# Patient Record
Sex: Male | Born: 1955 | Race: Black or African American | Hispanic: No | Marital: Single | Smoking: Current every day smoker
Health system: Southern US, Community
[De-identification: ages and names within clinical notes are randomized; demographics above are authoritative.]

## PROBLEM LIST (undated history)

## (undated) ENCOUNTER — Emergency Department (HOSPITAL_COMMUNITY): Admission: EM | Discharge status: Absent without leave | Payer: Medicare Other

## (undated) DIAGNOSIS — D126 Benign neoplasm of colon, unspecified: Secondary | ICD-10-CM

## (undated) DIAGNOSIS — F1410 Cocaine abuse, uncomplicated: Secondary | ICD-10-CM

## (undated) DIAGNOSIS — Z992 Dependence on renal dialysis: Secondary | ICD-10-CM

## (undated) DIAGNOSIS — B9681 Helicobacter pylori [H. pylori] as the cause of diseases classified elsewhere: Secondary | ICD-10-CM

## (undated) DIAGNOSIS — S060XAA Concussion with loss of consciousness status unknown, initial encounter: Secondary | ICD-10-CM

## (undated) DIAGNOSIS — K2970 Gastritis, unspecified, without bleeding: Secondary | ICD-10-CM

## (undated) DIAGNOSIS — N186 End stage renal disease: Secondary | ICD-10-CM

## (undated) DIAGNOSIS — I5032 Chronic diastolic (congestive) heart failure: Secondary | ICD-10-CM

## (undated) DIAGNOSIS — S060X9A Concussion with loss of consciousness of unspecified duration, initial encounter: Secondary | ICD-10-CM

## (undated) DIAGNOSIS — Z9289 Personal history of other medical treatment: Secondary | ICD-10-CM

## (undated) DIAGNOSIS — E119 Type 2 diabetes mellitus without complications: Secondary | ICD-10-CM

## (undated) DIAGNOSIS — M1990 Unspecified osteoarthritis, unspecified site: Secondary | ICD-10-CM

## (undated) DIAGNOSIS — H469 Unspecified optic neuritis: Secondary | ICD-10-CM

## (undated) DIAGNOSIS — Z720 Tobacco use: Secondary | ICD-10-CM

## (undated) DIAGNOSIS — D638 Anemia in other chronic diseases classified elsewhere: Secondary | ICD-10-CM

## (undated) DIAGNOSIS — G40909 Epilepsy, unspecified, not intractable, without status epilepticus: Secondary | ICD-10-CM

## (undated) DIAGNOSIS — R7612 Nonspecific reaction to cell mediated immunity measurement of gamma interferon antigen response without active tuberculosis: Secondary | ICD-10-CM

## (undated) DIAGNOSIS — K922 Gastrointestinal hemorrhage, unspecified: Secondary | ICD-10-CM

## (undated) DIAGNOSIS — R768 Other specified abnormal immunological findings in serum: Secondary | ICD-10-CM

## (undated) DIAGNOSIS — K921 Melena: Secondary | ICD-10-CM

## (undated) DIAGNOSIS — R51 Headache: Secondary | ICD-10-CM

## (undated) DIAGNOSIS — I119 Hypertensive heart disease without heart failure: Secondary | ICD-10-CM

## (undated) DIAGNOSIS — E875 Hyperkalemia: Secondary | ICD-10-CM

## (undated) DIAGNOSIS — E43 Unspecified severe protein-calorie malnutrition: Secondary | ICD-10-CM

## (undated) DIAGNOSIS — I10 Essential (primary) hypertension: Secondary | ICD-10-CM

## (undated) HISTORY — PX: SHOULDER OPEN ROTATOR CUFF REPAIR: SHX2407

## (undated) HISTORY — PX: INSERTION OF DIALYSIS CATHETER: SHX1324

## (undated) HISTORY — DX: Gastrointestinal hemorrhage, unspecified: K92.2

## (undated) HISTORY — PX: TOTAL KNEE ARTHROPLASTY: SHX125

## (undated) HISTORY — PX: JOINT REPLACEMENT: SHX530

---

## 2012-03-02 ENCOUNTER — Emergency Department (HOSPITAL_COMMUNITY): Payer: Medicare Other

## 2012-03-02 ENCOUNTER — Encounter (HOSPITAL_COMMUNITY): Payer: Self-pay | Admitting: *Deleted

## 2012-03-02 ENCOUNTER — Inpatient Hospital Stay (HOSPITAL_COMMUNITY)
Admission: EM | Admit: 2012-03-02 | Discharge: 2012-03-08 | DRG: 628 | Disposition: A | Discharge status: Home / Self Care | Payer: Medicare Other | Attending: Internal Medicine | Admitting: Internal Medicine

## 2012-03-02 DIAGNOSIS — F1410 Cocaine abuse, uncomplicated: Secondary | ICD-10-CM | POA: Diagnosis present

## 2012-03-02 DIAGNOSIS — G40909 Epilepsy, unspecified, not intractable, without status epilepticus: Secondary | ICD-10-CM | POA: Diagnosis present

## 2012-03-02 DIAGNOSIS — E8779 Other fluid overload: Principal | ICD-10-CM | POA: Diagnosis present

## 2012-03-02 DIAGNOSIS — N186 End stage renal disease: Secondary | ICD-10-CM | POA: Diagnosis present

## 2012-03-02 DIAGNOSIS — D631 Anemia in chronic kidney disease: Secondary | ICD-10-CM | POA: Diagnosis present

## 2012-03-02 DIAGNOSIS — E1129 Type 2 diabetes mellitus with other diabetic kidney complication: Secondary | ICD-10-CM | POA: Diagnosis present

## 2012-03-02 DIAGNOSIS — Z91158 Patient's noncompliance with renal dialysis for other reason: Secondary | ICD-10-CM

## 2012-03-02 DIAGNOSIS — I120 Hypertensive chronic kidney disease with stage 5 chronic kidney disease or end stage renal disease: Secondary | ICD-10-CM | POA: Diagnosis present

## 2012-03-02 DIAGNOSIS — Z992 Dependence on renal dialysis: Secondary | ICD-10-CM | POA: Diagnosis present

## 2012-03-02 DIAGNOSIS — E119 Type 2 diabetes mellitus without complications: Secondary | ICD-10-CM | POA: Diagnosis present

## 2012-03-02 DIAGNOSIS — N2581 Secondary hyperparathyroidism of renal origin: Secondary | ICD-10-CM | POA: Diagnosis present

## 2012-03-02 DIAGNOSIS — D649 Anemia, unspecified: Secondary | ICD-10-CM | POA: Diagnosis present

## 2012-03-02 DIAGNOSIS — I10 Essential (primary) hypertension: Secondary | ICD-10-CM | POA: Diagnosis present

## 2012-03-02 DIAGNOSIS — R195 Other fecal abnormalities: Secondary | ICD-10-CM | POA: Diagnosis present

## 2012-03-02 DIAGNOSIS — Z9115 Patient's noncompliance with renal dialysis: Secondary | ICD-10-CM

## 2012-03-02 DIAGNOSIS — I5032 Chronic diastolic (congestive) heart failure: Secondary | ICD-10-CM | POA: Diagnosis present

## 2012-03-02 DIAGNOSIS — Z720 Tobacco use: Secondary | ICD-10-CM | POA: Diagnosis present

## 2012-03-02 DIAGNOSIS — E8770 Fluid overload, unspecified: Secondary | ICD-10-CM | POA: Diagnosis present

## 2012-03-02 DIAGNOSIS — Z79899 Other long term (current) drug therapy: Secondary | ICD-10-CM

## 2012-03-02 DIAGNOSIS — R7611 Nonspecific reaction to tuberculin skin test without active tuberculosis: Secondary | ICD-10-CM | POA: Diagnosis present

## 2012-03-02 DIAGNOSIS — E875 Hyperkalemia: Secondary | ICD-10-CM | POA: Diagnosis present

## 2012-03-02 DIAGNOSIS — B1920 Unspecified viral hepatitis C without hepatic coma: Secondary | ICD-10-CM | POA: Diagnosis present

## 2012-03-02 DIAGNOSIS — F17200 Nicotine dependence, unspecified, uncomplicated: Secondary | ICD-10-CM | POA: Diagnosis present

## 2012-03-02 HISTORY — DX: Essential (primary) hypertension: I10

## 2012-03-02 HISTORY — DX: Dependence on renal dialysis: Z99.2

## 2012-03-02 HISTORY — DX: Nonspecific reaction to cell mediated immunity measurement of gamma interferon antigen response without active tuberculosis: R76.12

## 2012-03-02 HISTORY — DX: Anemia in other chronic diseases classified elsewhere: D63.8

## 2012-03-02 HISTORY — DX: Epilepsy, unspecified, not intractable, without status epilepticus: G40.909

## 2012-03-02 HISTORY — DX: Benign neoplasm of colon, unspecified: D12.6

## 2012-03-02 HISTORY — DX: Helicobacter pylori (H. pylori) as the cause of diseases classified elsewhere: K29.70

## 2012-03-02 HISTORY — DX: Type 2 diabetes mellitus without complications: E11.9

## 2012-03-02 HISTORY — DX: Helicobacter pylori (H. pylori) as the cause of diseases classified elsewhere: B96.81

## 2012-03-02 HISTORY — DX: Cocaine abuse, uncomplicated: F14.10

## 2012-03-02 HISTORY — DX: End stage renal disease: N18.6

## 2012-03-02 HISTORY — DX: Other specified abnormal immunological findings in serum: R76.8

## 2012-03-02 LAB — CBC
MCH: 28.3 pg (ref 26.0–34.0)
MCHC: 34 g/dL (ref 30.0–36.0)
Platelets: 154 10*3/uL (ref 150–400)
RBC: 1.87 MIL/uL — ABNORMAL LOW (ref 4.22–5.81)

## 2012-03-02 LAB — CBC WITH DIFFERENTIAL/PLATELET
Basophils Absolute: 0 10*3/uL (ref 0.0–0.1)
HCT: 18.1 % — ABNORMAL LOW (ref 39.0–52.0)
Hemoglobin: 6.1 g/dL — CL (ref 13.0–17.0)
Lymphocytes Relative: 17 % (ref 12–46)
Monocytes Absolute: 0.6 10*3/uL (ref 0.1–1.0)
Monocytes Relative: 7 % (ref 3–12)
Neutro Abs: 6.3 10*3/uL (ref 1.7–7.7)
Neutrophils Relative %: 75 % (ref 43–77)
RDW: 14.9 % (ref 11.5–15.5)
WBC: 8.4 10*3/uL (ref 4.0–10.5)

## 2012-03-02 LAB — BASIC METABOLIC PANEL
CO2: 18 mEq/L — ABNORMAL LOW (ref 19–32)
Chloride: 102 mEq/L (ref 96–112)
Creatinine, Ser: 13.55 mg/dL — ABNORMAL HIGH (ref 0.50–1.35)
GFR calc Af Amer: 4 mL/min — ABNORMAL LOW (ref >90)
Potassium: 6.5 mEq/L (ref 3.5–5.1)

## 2012-03-02 LAB — HEPATIC FUNCTION PANEL
Alkaline Phosphatase: 38 U/L — ABNORMAL LOW (ref 39–117)
Bilirubin, Direct: <0.1 mg/dL (ref 0.0–0.3)
Total Protein: 6.3 g/dL (ref 6.0–8.3)

## 2012-03-02 LAB — RENAL FUNCTION PANEL
Albumin: 2.7 g/dL — ABNORMAL LOW (ref 3.5–5.2)
Calcium: 7.4 mg/dL — ABNORMAL LOW (ref 8.4–10.5)
GFR calc Af Amer: 4 mL/min — ABNORMAL LOW (ref >90)
Phosphorus: 7.3 mg/dL — ABNORMAL HIGH (ref 2.3–4.6)
Potassium: 7 mEq/L (ref 3.5–5.1)
Sodium: 143 mEq/L (ref 135–145)

## 2012-03-02 LAB — FERRITIN: Ferritin: 287 ng/mL (ref 22–322)

## 2012-03-02 LAB — URINALYSIS, MICROSCOPIC ONLY
Leukocytes, UA: NEGATIVE
Nitrite: NEGATIVE
Specific Gravity, Urine: 1.016 (ref 1.005–1.030)
Urobilinogen, UA: 0.2 mg/dL (ref 0.0–1.0)
pH: 7.5 (ref 5.0–8.0)

## 2012-03-02 LAB — OCCULT BLOOD, POC DEVICE: Fecal Occult Bld: POSITIVE

## 2012-03-02 LAB — HEMOGLOBIN AND HEMATOCRIT, BLOOD
HCT: 20.9 % — ABNORMAL LOW (ref 39.0–52.0)
Hemoglobin: 7.3 g/dL — ABNORMAL LOW (ref 13.0–17.0)

## 2012-03-02 LAB — LIPID PANEL
HDL: 38 mg/dL — ABNORMAL LOW (ref >39)
LDL Cholesterol: 62 mg/dL (ref 0–99)
Triglycerides: 65 mg/dL (ref <150)
VLDL: 13 mg/dL (ref 0–40)

## 2012-03-02 LAB — RETICULOCYTES
Retic Count, Absolute: 9.9 10*3/uL — ABNORMAL LOW (ref 19.0–186.0)
Retic Ct Pct: 0.7 % (ref 0.4–3.1)

## 2012-03-02 LAB — VITAMIN B12: Vitamin B-12: 532 pg/mL (ref 211–911)

## 2012-03-02 LAB — IRON AND TIBC: TIBC: 212 ug/dL — ABNORMAL LOW (ref 215–435)

## 2012-03-02 LAB — LACTATE DEHYDROGENASE: LDH: 309 U/L — ABNORMAL HIGH (ref 94–250)

## 2012-03-02 LAB — GLUCOSE, CAPILLARY: Glucose-Capillary: 84 mg/dL (ref 70–99)

## 2012-03-02 LAB — RAPID URINE DRUG SCREEN, HOSP PERFORMED
Barbiturates: NOT DETECTED
Tetrahydrocannabinol: NOT DETECTED

## 2012-03-02 LAB — PREPARE RBC (CROSSMATCH)

## 2012-03-02 LAB — MAGNESIUM: Magnesium: 2 mg/dL (ref 1.5–2.5)

## 2012-03-02 LAB — PROTIME-INR: Prothrombin Time: 14.5 seconds (ref 11.6–15.2)

## 2012-03-02 LAB — HAPTOGLOBIN: Haptoglobin: 57 mg/dL (ref 45–215)

## 2012-03-02 MED ORDER — PENTAFLUOROPROP-TETRAFLUOROETH EX AERO: 1.0000 "application " | INHALATION_SPRAY | CUTANEOUS | Status: DC | PRN

## 2012-03-02 MED ORDER — SODIUM CHLORIDE 0.9 % IJ SOLN
3.0000 mL | Freq: Two times a day (BID) | INTRAMUSCULAR | Status: DC
  Administered 2012-03-02 – 2012-03-08 (×10): 3 mL via INTRAVENOUS

## 2012-03-02 MED ORDER — NEPRO/CARBSTEADY PO LIQD: 237.0000 mL | ORAL | Status: DC | PRN

## 2012-03-02 MED ORDER — PANTOPRAZOLE SODIUM 40 MG IV SOLR
40.0000 mg | Freq: Every day | INTRAVENOUS | Status: DC
  Administered 2012-03-02: 40 mg via INTRAVENOUS
  Filled 2012-03-02 (×2): qty 40

## 2012-03-02 MED ORDER — FOLIC ACID 1 MG PO TABS
1.0000 mg | ORAL_TABLET | Freq: Every day | ORAL | Status: DC
  Administered 2012-03-02 – 2012-03-08 (×6): 1 mg via ORAL
  Filled 2012-03-02 (×7): qty 1

## 2012-03-02 MED ORDER — LIDOCAINE-PRILOCAINE 2.5-2.5 % EX CREA: 1.0000 "application " | TOPICAL_CREAM | CUTANEOUS | Status: DC | PRN

## 2012-03-02 MED ORDER — ALTEPLASE 2 MG IJ SOLR
2.0000 mg | Freq: Once | INTRAMUSCULAR | Status: DC | PRN
  Filled 2012-03-02: qty 2

## 2012-03-02 MED ORDER — HYDRALAZINE HCL 50 MG PO TABS
50.0000 mg | ORAL_TABLET | Freq: Three times a day (TID) | ORAL | Status: DC
  Administered 2012-03-02 – 2012-03-04 (×7): 50 mg via ORAL
  Filled 2012-03-02 (×11): qty 1

## 2012-03-02 MED ORDER — PARICALCITOL 5 MCG/ML IV SOLN: 10.0000 ug | INTRAVENOUS | Status: DC

## 2012-03-02 MED ORDER — MECLIZINE HCL 12.5 MG PO TABS
12.5000 mg | ORAL_TABLET | Freq: Three times a day (TID) | ORAL | Status: DC | PRN
  Filled 2012-03-02: qty 1

## 2012-03-02 MED ORDER — INSULIN ASPART 100 UNIT/ML IV SOLN
10.0000 [IU] | Freq: Once | INTRAVENOUS | Status: AC
  Administered 2012-03-02: 10 [IU] via INTRAVENOUS
  Filled 2012-03-02: qty 0.1

## 2012-03-02 MED ORDER — VITAMIN B-1 100 MG PO TABS
100.0000 mg | ORAL_TABLET | Freq: Every day | ORAL | Status: DC
  Administered 2012-03-02 – 2012-03-08 (×6): 100 mg via ORAL
  Filled 2012-03-02 (×7): qty 1

## 2012-03-02 MED ORDER — LORAZEPAM 1 MG PO TABS
1.0000 mg | ORAL_TABLET | Freq: Four times a day (QID) | ORAL | Status: AC | PRN
  Administered 2012-03-02: 1 mg via ORAL
  Filled 2012-03-02: qty 1

## 2012-03-02 MED ORDER — LORAZEPAM 2 MG/ML IJ SOLN: 1.0000 mg | Freq: Four times a day (QID) | INTRAMUSCULAR | Status: AC | PRN

## 2012-03-02 MED ORDER — DEXTROSE 50 % IV SOLN
1.0000 | Freq: Once | INTRAVENOUS | Status: AC
  Administered 2012-03-02: 50 mL via INTRAVENOUS
  Filled 2012-03-02: qty 50

## 2012-03-02 MED ORDER — DARBEPOETIN ALFA-POLYSORBATE 200 MCG/0.4ML IJ SOLN
200.0000 ug | INTRAMUSCULAR | Status: DC
  Administered 2012-03-02: 200 ug via INTRAVENOUS
  Filled 2012-03-02: qty 0.4

## 2012-03-02 MED ORDER — SODIUM CHLORIDE 0.9 % IV SOLN: 100.0000 mL | INTRAVENOUS | Status: DC | PRN

## 2012-03-02 MED ORDER — ADULT MULTIVITAMIN W/MINERALS CH
1.0000 | ORAL_TABLET | Freq: Every day | ORAL | Status: DC
  Administered 2012-03-02 – 2012-03-08 (×6): 1 via ORAL
  Filled 2012-03-02 (×7): qty 1

## 2012-03-02 MED ORDER — CALCIUM GLUCONATE 10 % IV SOLN
1.0000 g | Freq: Once | INTRAVENOUS | Status: AC
  Administered 2012-03-02: 1 g via INTRAVENOUS
  Filled 2012-03-02: qty 10

## 2012-03-02 MED ORDER — THIAMINE HCL 100 MG/ML IJ SOLN
100.0000 mg | Freq: Every day | INTRAMUSCULAR | Status: DC
  Filled 2012-03-02 (×7): qty 1

## 2012-03-02 MED ORDER — DARBEPOETIN ALFA-POLYSORBATE 200 MCG/0.4ML IJ SOLN
INTRAMUSCULAR | Status: AC
  Filled 2012-03-02: qty 0.4

## 2012-03-02 MED ORDER — LIDOCAINE HCL (PF) 1 % IJ SOLN: 5.0000 mL | INTRAMUSCULAR | Status: DC | PRN

## 2012-03-02 MED ORDER — PARICALCITOL 5 MCG/ML IV SOLN
INTRAVENOUS | Status: AC
  Administered 2012-03-02: 10 ug
  Filled 2012-03-02: qty 2

## 2012-03-02 MED ORDER — HEPARIN SODIUM (PORCINE) 1000 UNIT/ML DIALYSIS
1000.0000 [IU] | INTRAMUSCULAR | Status: DC | PRN
  Filled 2012-03-02: qty 1

## 2012-03-02 MED ORDER — LEVETIRACETAM 500 MG PO TABS
1000.0000 mg | ORAL_TABLET | Freq: Two times a day (BID) | ORAL | Status: DC
  Administered 2012-03-02 – 2012-03-08 (×11): 1000 mg via ORAL
  Filled 2012-03-02 (×14): qty 2

## 2012-03-02 NOTE — Consult Note (Signed)
I have personally seen and examined this patient and agree with the assessment/plan as outlined above by [name] PA.   Plan on emergent dialysis for significant hyperkalemia and transfusion of PRBCs while on HD for his anemia. GI consulted by admitting service for GI bleed evaluation/management.  He will need a permanent dialysis access placed prior to acceptance to any local unit in the [place] area. Will get vascular surgery to see him to this effect. Thorough verification of his social situation (living set up) will need to be done prior to discharge as well.   Resume binders/VDRA and ESA as part of ongoing ESRD care.  Fenton Candee K.,MD 03/02/2012 3:17 PM

## 2012-03-02 NOTE — H&P (Signed)
Internal Medicine Attending Admission Note Date: 03/02/2012  Patient name: [name] Medical record number: [id number] Date of birth: 08/24/1955 Age: 56 y.o. Gender: male  I saw and evaluated the patient. I reviewed the resident's note and I agree with the resident's findings and plan as documented in the resident's note, with the following additional comments.  Chief Complaint(s): Shortness of breath  History - key components related to admission: Patient is a 56 year old man with history of end-stage renal disease on hemodialysis, hypertension, diabetes mellitus, COPD, hepatitis C, and other problems as outlined in the medical history who presented with complaint of progressively worsening shortness of breath.  He apparently missed his dialysis session this week.   Physical Exam - key components related to admission:  Filed Vitals:   03/02/12 1630 03/02/12 1645 03/02/12 1700 03/02/12 1730  BP: 199/100 210/101 204/95 188/79  Pulse: 81 82  83  Temp: 97.6 F (36.4 C) 97.6 F (36.4 C)    TempSrc: Oral Oral    Resp: 18 19    Height:      Weight:      SpO2:       General: Alert, no acute distress Lungs: Clear Heart: Regular; no extra sounds or murmurs Abdomen: Bowel sounds present, soft, nontender Extremities: No edema   Lab results:   Basic Metabolic Panel:  Basename 03/02/12 1407 03/02/12 1145 03/02/12 0951 03/02/12 0726  NA -- -- 143 142  K 6.5* -- 7.0* --  CL -- -- 104 102  CO2 -- -- 18* 18*  GLUCOSE -- -- 80 86  BUN -- -- 135* 135*  CREATININE -- -- 13.66* 13.55*  CALCIUM -- -- 7.4* 7.6*  MG -- 2.0 -- --  PHOS -- -- 7.3* --   Liver Function Tests:  [facility] 03/02/12 0951 03/02/12 0950  AST -- 16  ALT -- 12  ALKPHOS -- 38*  BILITOT -- 0.2*  PROT -- 6.3  ALBUMIN 2.7* 2.7*    CBC:  Basename 03/02/12 1145 03/02/12 0726  WBC 8.7 8.4  NEUTROABS -- 6.3  HGB 5.3* 6.1*  HCT 15.6* 18.1*  MCV 83.4 85.0  PLT 154 165    CBG:  Basename 03/02/12 1328    GLUCAP 82    Fasting Lipid Panel:  Basename 03/02/12 1059  CHOL 113  HDL 38*  LDLCALC 62  TRIG 65  CHOLHDL 3.0  LDLDIRECT --    Anemia Panel:  Basename 03/02/12 0950  VITAMINB12 532  FOLATE --  FERRITIN 287  TIBC 212*  IRON 23*  RETICCTPCT 0.7   Coagulation:  Basename 03/02/12 0950  INR 1.15   Drugs of Abuse     Component Value Date/Time   LABOPIA NONE DETECTED 03/02/2012 0728   COCAINSCRNUR POSITIVE* 03/02/2012 0728   LABBENZ NONE DETECTED 03/02/2012 0728   AMPHETMU NONE DETECTED 03/02/2012 0728   THCU NONE DETECTED 03/02/2012 0728   LABBARB NONE DETECTED 03/02/2012 0728    Urinalysis    Component Value Date/Time   COLORURINE YELLOW 03/02/2012 0728   APPEARANCEUR CLEAR 03/02/2012 0728   LABSPEC 1.016 03/02/2012 0728   PHURINE 7.5 03/02/2012 0728   GLUCOSEU 250* 03/02/2012 0728   HGBUR SMALL* 03/02/2012 0728   BILIRUBINUR NEGATIVE 03/02/2012 0728   KETONESUR NEGATIVE 03/02/2012 0728   PROTEINUR >300* 03/02/2012 0728   UROBILINOGEN 0.2 03/02/2012 0728   NITRITE NEGATIVE 03/02/2012 0728   LEUKOCYTESUR NEGATIVE 03/02/2012 0728   Urine microscopic: WBCs 0-2, RBCs 3-6, squamous epithelial rare, bacteria rare   Imaging results:  Dg  Chest 2 View  03/02/2012  *RADIOLOGY REPORT*  Clinical Data: Cough, congestion, shortness of breath, weakness, smoker, hypertension, diabetes, end-stage renal disease on dialysis  CHEST - 2 VIEW  Comparison: None  Findings: Right jugular central venous catheter with tip projecting over right atrium. Mild enlargement of cardiac silhouette with pulmonary vascular congestion. Mediastinal contours normal. Minimal bronchitic changes. No segmental consolidation, pleural effusion or pneumothorax. Question 12 mm diameter nodular density mid to lower right chest, question nipple shadow. Bones demineralized.  IMPRESSION: Enlargement of cardiac silhouette with pulmonary vascular congestion. Question right nipple shadow; repeat PA chest  radiograph with nipple markers recommended to exclude pulmonary nodule.   Original Report Authenticated By: Ulyses Southward, M.D.      Assessment & Plan by Problem:  1.  Chronic renal failure on hemodialysis, now with volume overload and hyperkalemia.  Patient presents with shortness of breath likely due to volume overload secondary to missed hemodialysis; labs were notable for hyperkalemia.  [name] currently being hemodialyzed; plan is hemodialysis as per nephrology.  2.  Severe anemia.  Patient has a history of severe anemia according to records from providers in [place], and has undergone prior workup there in 2012.  Plan is transfuse with hemodialysis; follow hemoglobin closely; GI consult obtained.  3.  Hypertension.  Patient presents with poorly controlled hypertension, likely aggravated by volume overload.  Plan as above is for hemodialysis; continue hydralazine; reinstitute home regimen as able.  4.  CODE STATUS.  I discussed code status with patient, and he indicated to me that he desired full code status.  Given this, the plan is to change his status to full code.  5.  Other problems as per resident physician's note.

## 2012-03-02 NOTE — Progress Notes (Signed)
Denzil Magnuson, PA notified of critical K+ level of 6.5.  Order received to continue with current plan of care.  Pt continues on 1k+ bath for first hour of treatment then resume to 2K+ for remainder of treatment.
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
## 2012-03-02 NOTE — ED Notes (Addendum)
Pt states that his vascular access was last used on 11/09
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
## 2012-03-02 NOTE — ED Notes (Signed)
Nephrology MD a bedside.

## 2012-03-02 NOTE — ED Notes (Signed)
Lab at bedside to collect blood.  

## 2012-03-02 NOTE — Progress Notes (Signed)
CRITICAL VALUE ALERT  Critical value received:  Potassium 6.5 Date of notification:03-02-2012  Time of notification:  1510  Critical value read back:yes  Nurse who received alert:  Kandis Ban  MD notified (1st page):  Denzil Magnuson, PA  Time of first page:  1515  MD notified (2nd page):  Time of second page:  Responding MD:  Denzil Magnuson, PA  Time MD responded:  1520

## 2012-03-02 NOTE — ED Notes (Signed)
Pt. Reports that he dies not feel well.  Pt. Is from [facility] and is trying to get his dialysis changed to [place]. Pt. Has no thad dialysis since Saturday.

## 2012-03-02 NOTE — Procedures (Addendum)
Patient seen on Hemodialysis- sleepy but easily awoken and engaged in converasation. QB 350, UF goal 1.3L Treatment adjusted as needed. Dialysis and PRBC transfusion consent forms signed.  Zetta Bills MD [facility]. Office # [phone] Pager # [phone] 2:51 PM

## 2012-03-02 NOTE — Progress Notes (Signed)
Utilization Review Completed.   Telly Broberg, RN, BSN Nurse Case Manager  [phone]  

## 2012-03-02 NOTE — H&P (Signed)
Date: 03/02/2012               Patient Name:  [name] MRN: [id number]  DOB: October 14, 1955 Age / Sex: 56 y.o., male   PCP: None.               Medical Service: Internal Medicine Teaching Service              Attending Physician: Dr. Meredith Pel    First Contact: Dr. Heloise Beecham Pager: [phone]  Second Contact: Dr. Bosie Clos Pager: [phone]            After Hours (After 5p/  First Contact Pager: [phone]  weekends / holidays): Second Contact Pager: [phone]     Chief Complaint: shortness of breath  History of Present Illness: Patient is a 56 y.o. male with a PMHx of ESRD (TTS in [place], [place] - with recent relocation to [place] without establishing with renal, continues to make urine), who presents to [facility] for evaluation of progressively worsening shortness of breath over the last several days. States that the shortness of breath is present both at rest, and worse with exertion. He has also experienced occasionally productive cough over this time frame, without associated fevers, chills, chest tightness, or wheezing. The patient states that his HD schedule is TTS and he last went to dialysis on Saturday (4 days prior to admission), however, did miss his Tuesday dialysis center, as he was not able to travel back to [place]. The patient otherwise denies chest pain, palpitations, dizziness, lightheadedness, blood in his stools or urine. He further denies redness, swelling, or drainage from his R subclavian HD catheter site. He has had a colonoscopy in the last 5-10 years, although he cannot recall where or any results. The patient's story regarding history of fevers, chills varies per interviewer, however, he denies these symptoms to me. He further denies recent increased use of NSAIDs, history of GI ulcers, and personal or family history of GI malignancies.  Of note, during the ER course, the patient was noted to have hyperkalemia with a Serum K of 6.5, and was administered calcium gluconate,  followed by D50 and insulin. Renal service as been consulted, and plan for dialysis. As well, he is noted to be FOBT (+) with Hgb of 6.1, and is therefore being typed and screened.   Review of Systems: Constitutional:  denies fever, chills, diaphoresis, appetite change and fatigue.  HEENT: denies photophobia, eye pain, redness, hearing loss, ear pain, congestion, sore throat, rhinorrhea, sneezing.  Respiratory: admits to SOB, DOE, cough. Denies chest tightness, and wheezing.  Cardiovascular: denies chest pain, palpitations and leg swelling.  Gastrointestinal: denies nausea, vomiting, abdominal pain, diarrhea, constipation, blood in stool.  Genitourinary: denies dysuria, urgency, frequency, hematuria, flank pain and difficulty urinating.  Musculoskeletal: denies  myalgias, back pain, joint swelling, arthralgias and gait problem.   Skin: denies pallor, rash and wound.  Neurological: denies dizziness, seizures, syncope, weakness, light-headedness, numbness and headaches.   Hematological: denies adenopathy, easy bruising, personal or family bleeding history.    Current Outpatient Medications: Current Facility-Administered Medications Medication Dose  . [COMPLETED] calcium gluconate 1 g in sodium chloride 0.9 % 100 mL IVPB  1 g  . dextrose 50 % solution 50 mL  1 ampule  . insulin aspart (novoLOG) injection 10 Units  10 Units   Current Outpatient Prescriptions Medication Sig  . hydrALAZINE (APRESOLINE) 50 MG tablet Take 50 mg by mouth 3 (three) times daily.  Marland Kitchen levETIRAcetam (KEPPRA) 1000 MG tablet Take 1,000  mg by mouth 2 (two) times daily.  Marland Kitchen losartan (COZAAR) 100 MG tablet Take 100 mg by mouth daily.  . meclizine (ANTIVERT) 12.5 MG tablet Take 12.5 mg by mouth 3 (three) times daily as needed. For dizziness  . metoprolol (LOPRESSOR) 50 MG tablet Take 50 mg by mouth 2 (two) times daily.    Allergies: No Known Allergies    Past Medical History: Past Medical History  Diagnosis Date  .  Hypertension   . Diabetes mellitus without complication     on oral pills only  . ESRD (end stage renal disease) on dialysis   . Seizure disorder     questionable history of - will need to clarify with PCP    Past Surgical History: Past Surgical History  Procedure Date  . No past surgeries     Family History: Family History  Problem Relation Age of Onset  . Diabetes Mother   . Hypertension Mother   . Stroke Mother   . Kidney failure Mother     Social History: History   Social History  . Marital Status: Single    Spouse Name: N/A    Number of Children: N/A  . Years of Education: N/A   Occupational History  . Not on file.   Social History Main Topics  . Smoking status: Current Every Day Smoker -- 1.0 packs/day for 15 years    Types: Cigarettes  . Smokeless tobacco: Not on file  . Alcohol Use: No  . Drug Use: Yes     Comment: smokes crack-cocaine  . Sexually Active: Not Currently   Other Topics Concern  . Not on file   Social History Narrative  . No narrative on file     Vital Signs: Blood pressure 159/81, pulse 83, temperature 97.7 F (36.5 C), temperature source Oral, resp. rate 24, SpO2 87.00%.  Physical Exam: General: Vital signs reviewed and noted. Well-developed, well-nourished, in no acute distress; alert, appropriate, although soft spoken, and easily agitated.  Head: Normocephalic, atraumatic.  Eyes: PERRL, EOMI, No signs of anemia or jaundince.  Nose: Mucous membranes moist, not inflammed, nonerythematous.  Throat: Oropharynx nonerythematous, no exudate appreciated. Poor dentition, dental cares noted.  Neck: No deformities, masses, or tenderness noted. Supple, right carotid bruit, no left carotid bruit. (+) JVD.  Lungs:  Normal respiratory effort. Clear to auscultation BL without crackles or wheezes.  Heart: RRR. S1 and S2 normal without gallop, murmur, or rubs.  Abdomen:  BS normoactive. Soft, mildly distended, non-tender.  No masses or  organomegaly.  Extremities: No pretibial edema. Skin dry.  Neurologic: A&O X3, CN II - XII are grossly intact. Motor strength is 5/5 in the all 4 extremities, Sensations intact to light touch.    Lab results: CBC:    Component Value Date/Time   WBC 8.4 03/02/2012 0726   HGB 6.1* 03/02/2012 0726   HCT 18.1* 03/02/2012 0726   PLT 165 03/02/2012 0726   MCV 85.0 03/02/2012 0726   NEUTROABS 6.3 03/02/2012 0726   LYMPHSABS 1.4 03/02/2012 0726   MONOABS 0.6 03/02/2012 0726   EOSABS 0.1 03/02/2012 0726   BASOSABS 0.0 03/02/2012 0726     Metabolic Panel:    Component Value Date/Time   NA 142 03/02/2012 0726   K 6.5* 03/02/2012 0726   CL 102 03/02/2012 0726   CO2 18* 03/02/2012 0726   BUN 135* 03/02/2012 0726   CREATININE 13.55* 03/02/2012 0726   GLUCOSE 86 03/02/2012 0726   CALCIUM 7.6* 03/02/2012 0726  Urinalysis:  Basename 03/02/12 0728  COLORURINE YELLOW  LABSPEC 1.016  PHURINE 7.5  GLUCOSEU 250*  HGBUR SMALL*  BILIRUBINUR NEGATIVE  KETONESUR NEGATIVE  PROTEINUR >300*  UROBILINOGEN 0.2  NITRITE NEGATIVE  LEUKOCYTESUR NEGATIVE     Drugs of Abuse     Component Value Date/Time   LABOPIA NONE DETECTED 03/02/2012 0728   COCAINSCRNUR POSITIVE* 03/02/2012 0728   LABBENZ NONE DETECTED 03/02/2012 0728   AMPHETMU NONE DETECTED 03/02/2012 0728   THCU NONE DETECTED 03/02/2012 0728   LABBARB NONE DETECTED 03/02/2012 0728     Historical Labs: No results found for this basename: HGBA1C    No results found for this basename: CHOL,  HDL,  LDLCALC,  LDLDIRECT,  TRIG,  CHOLHDL    No results found for this basename: TSH,  T3TOTAL,  T4TOTAL,  THYROIDAB     Imaging results:   Dg Chest 2 View (03/02/2012) - Enlargement of cardiac silhouette with pulmonary vascular congestion. Question right nipple shadow; repeat PA chest radiograph with nipple markers recommended to exclude pulmonary nodule.   Original Report Authenticated By: Ulyses Southward, M.D.      Other  results:  EKG (03/02/2012) - Normal Sinus Rhythm rate of ~80 bpm, normal axis, ST segments: peaked T waves noted..    Assessment & Plan:  Pt is a 56 y.o. yo male with a PMHx of ESRD on HD (TTS in [place], [place]), HTN, DM2 who was admitted on 03/02/2012 with symptoms of progressively worsening shortness of breath, which is likely secondary to noncompliance with dialysis schedule. Interventions at this time will be focused on treatment of metabolic disturbances, addressing social issues, while .    1) Shortness of breath - likely secondary to volume overload in setting of noncompliance with dialysis schedule, as also confirmed by CXR showing vascular congestion. The patient does however also confirm recent cough, and follows at dialysis center, therefore, he would be at risk for health-care associated infection such as pneumonia (which may not be clearly defined on initial CXR 2/2 significant pulmonary congestion). He also has multiple cardiac risk factors including ongoing tobacco abuse, age, crack-cocaine use therefore, acute cardiac etiology is also considered. Lastly, given extent of presenting anemia, possible acute blood loss anemia to contribute towards presenting symptoms.  Plan: - Will admit to telemetry unit. - Cycle cardiac enzymes, get repeat EKG after adequate tx of hyperkalemia. - Consult with renal for initiation of dialysis. - Repeat CXR after dialysis and sufficient volume removal. - Will check urine legionella, strep antigens. - Defer antibiotics for now, however, will reassess if change in clinical picture (or if indication of PNA per repeat CXR). - Will check blood cultures x 2 given questionable history of fevers, chills. - SW for abuse cessation counseling. - Check 2-D echocardiogram. - Will consult GI service for ongoing evaluation for GI source. - Workup/ Treatment of anemia as below.   2) ESRD on HD (TTS in [place] at [facility]) - last dialysis session on  02/26/2012, missed session on 02/29/2012, and nursing facility indicates prior poor compliance with HD. ESRD possibly secondary to renal dysfunction caused by diabetes, uncontrolled HTN, cocaine abuse - unclear without records. Of note, patient does make urine.  Plan: - Renal service consulted - Dr. Abel Presto to see - appreciate his help and initiation of dialysis. - Check renal function panel --> if evidence of hyperphosphatemia (goal < 5.5), will add calcium based phosphate binder as he is also hypocalcemic on admission. - Check 25 and 1,25 OH vitamin  D, and intact PTH levels in setting of his hypocalcemia. - Will request records from prior HD center.  - Will need to be clipped into [facility] for continued outpatient HD.  3) Acute normocytic anemia - Admission Hgb 6.1. No prior labs in system. Likely mixed picture with component of AOCD in setting of ESRD and HD. However, pt is also FOBT (+) with grossly brown stool, therefore, iron deficiency component is also likely. Given severity of anemia and symptomatic presentation (with SOB), likely would benefit from transfusion. Of note, the patient confirms colonoscopy within last 5-10 years, however, is unable to clarify where this was conducted or results of the study.  Plan: - Ordered anemia panel, LDH, haptoglobin, peripheral smear before transfusion. - If transferrin sat < 20, may benefit from IV iron in dialysis. - Will discuss with renal team regarding transfusion during dialysis. - Will consult GI service for consideration for inpatient evaluation - given severity of his presenting anemia. - Avoid heparin products right now. - Requested records from [facility] in [place], [place] and his recent nursing facility, [facility] - we are working on tracking down this colonoscopy report.  4) Hyperkalemia - likely secondary to ongoing ARB use and missing his dialysis session. Has been treated with insulin/ D50 x 1 in  ED. Will follow-up and needs close monitor, particularly given peaked T waves per EKG. - Will follow-up K in 2-3 hours. If still elevated, may consider kaexaylate  - Will confer with renal service regarding how quickly HD can be initiated. - HOLD ARB.  5) Hypocalcemia - likely 2/2 ESRD.  - Will start TUMS TID with meals given phos is also elevated at 7.7 - Will check Mg, replete as appropriate. - Check vitamin D levels, intact PTH to assess for additional contributing causes.  6) Hyperphosphatemia - related to his ESRD, and missing HD.  - HD per renal. - Plan likely start Ca-based phosphate binder if still elevated tomorrow.  7) Diabetes mellitus, type 2 - non-insulin dependent, diet controlled at baseline. - Will not check A1c at present given likely inaccuracy in setting of acute anemia.  - CBG monitoring q4h initially, until GI service evaluates for intervention. ==> then will change to qAC and HS  8) Hypertension - BP elevated, likely in setting of volume overload. Home medications include metoprolol, Hydralazine, Losartan. Expect improvement after HD. - Continue Hydralazine. - Hold Metoprolol prior to HD, but will need to be resumed after HD. - STOP Losartan at this time in setting of hyperkalemia.  9) Tobacco abuse - refuses nicotine patch inpatient. Ongoing 1 ppd. - SW consult for cessation counseling - after acute and more serious issues addressed (ordered already placed).  10)  Cocaine abuse - UDS (+), admits to smoking crack cocaine, but will not discuss specifics. - SW consult for cessation counseling - after acute and more serious issues addressed (ordered already placed).    DVT PPX - SCD's while in bed - avoid heparin in setting of his anemia  CODE STATUS - DNR - I discussed with the patient his wishes in case of cardiac arrest or respiratory arrest. He indicates that he would not want cardiac intervention with CPR or medications, he "wants to die if he is actively  dying". Similarly, if respiratory arrest occurs, he does not want intubation or ventilation, again stating he "wants to diet if he is actively dying". DNR order placed.  CONSULTS PLACED - GI (LB), and renal ([place]), SW (cessation  counseling)  DISPO - Disposition is deferred at this time, awaiting improvement of volume status, .   Anticipated discharge in approximately 3-4 day(s).   The patient does not have current PCP (No primary provider on file.), therefore WILL BE requiring OPC follow-up after discharge.   Lastly, the patient's need for help with transportation to appointments will need to be addressed.    SERVICE NEEDED AT DISCHARGE - To be determined during hospital course         Y = Yes, Blank = No PT:   OT:   RN:   Equipment:   Other:      Signed: Priscella Mann, DO  PGY-III, Internal Medicine Resident 03/02/2012, 9:47 AM

## 2012-03-02 NOTE — Progress Notes (Signed)
[id number] To unit 6700 per stretcher accompanied by NT alert and oriented pt; telemetry placed per order; no skin issues noted; hgb 5.3 k 7 ; Hd notified of pt's admission report called; pt to have blood transfusion at hd;

## 2012-03-02 NOTE — ED Provider Notes (Signed)
History     CSN: [id number]  Arrival date & time 03/02/12  0701   First MD Initiated Contact with Patient 03/02/12 0715      Chief Complaint  Patient presents with  . Shortness of Breath  . Vascular Access Problem    (Consider location/radiation/quality/duration/timing/severity/associated sxs/prior treatment) HPI Pt states he is on HD on TTS schedule for ESRD. He states he missed his Tuesday appt two days prior to presentation, but otherwise has been compliant with his HD. He states the reason he missed the appt was because he had been getting HD in [name], [place], and just moved to [place] this week, is not established with a local center, and does not have transportation to [place] for HD. His primary complaints are malaise, fevers/chills, and nausea. Pt also admits to SOB, but denies chest pain. All symptoms began gradually at approximately 0200 this AM. Denies vomiting. Denies redness, swelling, drainage, pain around HD site (R subclavian access). Pt states he produces significant amounts of urine. Denies any other complaints.   Past Medical History  Diagnosis Date  . Hypertension   . Diabetes mellitus without complication     on oral pills only  . ESRD (end stage renal disease) on dialysis since 2012  . Seizure disorder     questionable history of - will need to clarify with PCP    Past Surgical History  Procedure Date  . No past surgeries     Family History  Problem Relation Age of Onset  . Diabetes Mother   . Hypertension Mother   . Stroke Mother   . Kidney failure Mother     History  Substance Use Topics  . Smoking status: Current Every Day Smoker -- 1.0 packs/day for 15 years    Types: Cigarettes  . Smokeless tobacco: Not on file  . Alcohol Use: No      Review of Systems  All other systems reviewed and are negative.    Allergies  Review of patient's allergies indicates no known allergies.  Home Medications   Current Outpatient Rx  Name  Route   Sig  Dispense  Refill  . HYDRALAZINE HCL 50 MG PO TABS   Oral   Take 50 mg by mouth 3 (three) times daily.         Marland Kitchen LEVETIRACETAM 1000 MG PO TABS   Oral   Take 1,000 mg by mouth 2 (two) times daily.         Marland Kitchen LOSARTAN POTASSIUM 100 MG PO TABS   Oral   Take 100 mg by mouth daily.         Marland Kitchen MECLIZINE HCL 12.5 MG PO TABS   Oral   Take 12.5 mg by mouth 3 (three) times daily as needed. For dizziness         . METOPROLOL TARTRATE 50 MG PO TABS   Oral   Take 50 mg by mouth 2 (two) times daily.           BP 159/81  Pulse 83  Temp 97.7 F (36.5 C) (Oral)  Resp 24  SpO2 87%  Physical Exam  Constitutional: [name] oriented to person, place, and time. He appears well-developed and well-nourished. No distress.  HENT:  Head: Normocephalic and atraumatic.  Eyes: Pupils are equal, round, and reactive to light. No scleral icterus.  Neck: Normal range of motion. No tracheal deviation present.  Cardiovascular: Normal rate and regular rhythm.   No murmur heard. Pulmonary/Chest: Effort normal. He has no  wheezes. He has rales (mild, bibasilar).       HD access site at R subclavian, dressing c/d/i. No associated erythema, edema, increased warmth, or TTP  Abdominal: Soft. Bowel sounds are normal. He exhibits no distension. There is no tenderness.  Musculoskeletal: Normal range of motion. He exhibits no edema.  Neurological: [name] alert and oriented to person, place, and time. No cranial nerve deficit.  Skin: Skin is warm and dry. No rash noted.  Psychiatric: He has a normal mood and affect. His behavior is normal.    ED Course  Procedures (including critical care time)   Date: 03/02/2012  Rate: 82  Rhythm: normal sinus rhythm  QRS Axis: normal  Intervals: normal  ST/T Wave abnormalities: Peaked T-waves   Conduction Disutrbances:none  Narrative Interpretation:   Old EKG Reviewed: none available    Labs Reviewed  URINALYSIS, MICROSCOPIC ONLY - Abnormal; Notable for the  following:    Glucose, UA 250 (*)     Hgb urine dipstick SMALL (*)     Protein, ur >300 (*)     All other components within normal limits  URINE RAPID DRUG SCREEN (HOSP PERFORMED) - Abnormal; Notable for the following:    Cocaine POSITIVE (*)     All other components within normal limits  CBC WITH DIFFERENTIAL - Abnormal; Notable for the following:    RBC 2.13 (*)     Hemoglobin 6.1 (*)     HCT 18.1 (*)     All other components within normal limits  BASIC METABOLIC PANEL - Abnormal; Notable for the following:    Potassium 6.5 (*)     CO2 18 (*)     BUN 135 (*)     Creatinine, Ser 13.55 (*)     Calcium 7.6 (*)     GFR calc non Af Amer 4 (*)     GFR calc Af Amer 4 (*)     All other components within normal limits  OCCULT BLOOD, POC DEVICE  TYPE AND SCREEN  PROTIME-INR  RENAL FUNCTION PANEL  HEPATIC FUNCTION PANEL  VITAMIN B12  FOLATE  IRON AND TIBC  FERRITIN  RETICULOCYTES  LACTATE DEHYDROGENASE  HAPTOGLOBIN  PATHOLOGIST SMEAR REVIEW  VITAMIN D 25 HYDROXY  VITAMIN D 1,25 DIHYDROXY  PARATHYROID HORMONE, INTACT (NO CA)  CULTURE, BLOOD (ROUTINE X 2)  CULTURE, BLOOD (ROUTINE X 2)   Dg Chest 2 View  03/02/2012  *RADIOLOGY REPORT*  Clinical Data: Cough, congestion, shortness of breath, weakness, smoker, hypertension, diabetes, end-stage renal disease on dialysis  CHEST - 2 VIEW  Comparison: None  Findings: Right jugular central venous catheter with tip projecting over right atrium. Mild enlargement of cardiac silhouette with pulmonary vascular congestion. Mediastinal contours normal. Minimal bronchitic changes. No segmental consolidation, pleural effusion or pneumothorax. Question 12 mm diameter nodular density mid to lower right chest, question nipple shadow. Bones demineralized.  IMPRESSION: Enlargement of cardiac silhouette with pulmonary vascular congestion. Question right nipple shadow; repeat PA chest radiograph with nipple markers recommended to exclude pulmonary nodule.    Original Report Authenticated By: Ulyses Southward, M.D.      1. Hyperkalemia   2. Anemia       MDM  0740 - Pt has bibasilar rales and SOB. CXR ordered 2/2 concerns for hypervolemia. No signs/symptoms of infection at this time. Will consult nephrology to evaluate pt and attempt to have him placed at local HD center. Dispo pending completion of workup and determination of pt's need for emergent HD.  0850 - Pt found to have Hb = 6.1 and hemoccult positive. BP stable, no evidence of active bleed. Pt also found to have K+ = 6.5 with peaked T-waves on EKG. IV calcium ordered. Pt currently hemodynamically stable.   4098 - Pt to be admitted by IM teaching service, and evaluated by nephrology. Received IV calcium. Will give insulin/D50 per nephrology recs for hyperkalemia prior to receiving HD.   Elfredia Nevins, MD 03/02/12 1005

## 2012-03-02 NOTE — ED Notes (Signed)
Internal Med PA at bedside. States that she will request the pt sign the authorization for release of information form.

## 2012-03-03 DIAGNOSIS — I059 Rheumatic mitral valve disease, unspecified: Secondary | ICD-10-CM

## 2012-03-03 DIAGNOSIS — N186 End stage renal disease: Secondary | ICD-10-CM

## 2012-03-03 DIAGNOSIS — Z992 Dependence on renal dialysis: Secondary | ICD-10-CM

## 2012-03-03 LAB — RENAL FUNCTION PANEL
Albumin: 2.2 g/dL — ABNORMAL LOW (ref 3.5–5.2)
BUN: 56 mg/dL — ABNORMAL HIGH (ref 6–23)
Calcium: 7.8 mg/dL — ABNORMAL LOW (ref 8.4–10.5)
Chloride: 98 mEq/L (ref 96–112)
Creatinine, Ser: 6.14 mg/dL — ABNORMAL HIGH (ref 0.50–1.35)
Phosphorus: 5.5 mg/dL — ABNORMAL HIGH (ref 2.3–4.6)

## 2012-03-03 LAB — PATHOLOGIST SMEAR REVIEW: Path Review: NORMAL

## 2012-03-03 LAB — CBC
Platelets: 142 10*3/uL — ABNORMAL LOW (ref 150–400)
RBC: 2.09 MIL/uL — ABNORMAL LOW (ref 4.22–5.81)
RDW: 14.7 % (ref 11.5–15.5)
WBC: 4.3 10*3/uL (ref 4.0–10.5)

## 2012-03-03 LAB — HEMOGLOBIN AND HEMATOCRIT, BLOOD
HCT: 19.2 % — ABNORMAL LOW (ref 39.0–52.0)
Hemoglobin: 6.5 g/dL — CL (ref 13.0–17.0)
Hemoglobin: 6 g/dL — CL (ref 13.0–17.0)

## 2012-03-03 LAB — DIRECT ANTIGLOBULIN TEST (NOT AT ARMC): DAT, complement: NEGATIVE

## 2012-03-03 LAB — TROPONIN I: Troponin I: <0.3 ng/mL (ref <0.30)

## 2012-03-03 MED ORDER — CALCIUM CARBONATE ANTACID 500 MG PO CHEW
400.0000 mg | CHEWABLE_TABLET | Freq: Two times a day (BID) | ORAL | Status: DC
  Administered 2012-03-03 – 2012-03-06 (×7): 400 mg via ORAL
  Filled 2012-03-03 (×14): qty 2

## 2012-03-03 MED ORDER — METOPROLOL TARTRATE 50 MG PO TABS
50.0000 mg | ORAL_TABLET | Freq: Two times a day (BID) | ORAL | Status: DC
  Administered 2012-03-03 – 2012-03-08 (×10): 50 mg via ORAL
  Filled 2012-03-03 (×12): qty 1

## 2012-03-03 MED ORDER — PANTOPRAZOLE SODIUM 40 MG PO TBEC
40.0000 mg | DELAYED_RELEASE_TABLET | Freq: Every day | ORAL | Status: DC
  Administered 2012-03-03 – 2012-03-07 (×5): 40 mg via ORAL
  Filled 2012-03-03 (×5): qty 1

## 2012-03-03 MED ORDER — VITAMIN D3 25 MCG (1000 UNIT) PO TABS
1000.0000 [IU] | ORAL_TABLET | Freq: Every day | ORAL | Status: DC
  Administered 2012-03-03 – 2012-03-08 (×5): 1000 [IU] via ORAL
  Filled 2012-03-03 (×6): qty 1

## 2012-03-03 NOTE — Progress Notes (Signed)
  Echocardiogram 2D Echocardiogram has been performed.  Cathie Beams 03/03/2012, 10:37 AM

## 2012-03-03 NOTE — Progress Notes (Signed)
INITIAL ADULT NUTRITION ASSESSMENT Date: 03/03/2012   Time: 10:44 AM Reason for Assessment: Nutrition Risk  INTERVENTION: Pt states that he does not want education of the renal diet.  Does not follow it at home.  Encouraged pt not to use salt or high potasium foods.  Doubt compliance at this time. Encouraged pt to speak with RD at HD center when ready and if questions.    REC:  Increase diet to 80/90-2-2 RF 1200 ml FR diet to better meet nutritional needs.  Dietitian [phone]  ASSESSMENT: Male 56 y.o.  Dx: Volume overload  Hx:  Past Medical History  Diagnosis Date  . Hypertension   . Diabetes mellitus without complication     on oral pills only  . ESRD (end stage renal disease) on dialysis since 2012  . Seizure disorder     questionable history of - will need to clarify with PCP  . Anemia, chronic disease   . CHF (congestive heart failure)     diastolic.  EF 60 - 65% per [facility] eco 11/2011  . Cocaine abuse     mentioned in notes from [place]  . Hepatitis C antibody test positive     was HIV negative, 02/28/12  . Hepatitis B core antibody positive     03/01/10  . Positive QuantiFERON-TB Gold test     11/2011  . Helicobacter pylori gastritis     not defined if this was treated  . Polyp of colon, adenomatous     May 2012.  Dr [name] in [place]  . Shortness of breath    Past Surgical History  Procedure Date  . Shoulder surgery   . Knee surgery     Related Meds:     . calcium carbonate  400 mg of elemental calcium Oral BID WC  . cholecalciferol  1,000 Units Oral Daily  . [EXPIRED] darbepoetin      . darbepoetin (ARANESP) injection - DIALYSIS  200 mcg Intravenous Q Thu-HD  . folic acid  1 mg Oral Daily  . hydrALAZINE  50 mg Oral TID  . levETIRAcetam  1,000 mg Oral BID  . metoprolol tartrate  50 mg Oral BID  . multivitamin with minerals  1 tablet Oral Daily  . pantoprazole (PROTONIX) IV  40 mg Intravenous QHS  . [COMPLETED] paricalcitol      . sodium  chloride  3 mL Intravenous Q12H  . thiamine  100 mg Oral Daily   Or  . thiamine  100 mg Intravenous Daily  . [DISCONTINUED] paricalcitol  10 mcg Intravenous Q T,Th,Sa-HD    Ht: 5\' 9"  (175.3 cm)  Wt: 140 lb 12.8 oz (63.866 kg)  Ideal Wt: 160#  (72.7 kg) Ideal Wt: 88  Usual Wt: 150# % Usual Wt: 93  Body mass index is 20.79 kg/(m^2).  Labs:  CMP     Component Value Date/Time   NA 137 03/03/2012 0726   K 3.7 03/03/2012 0726   CL 98 03/03/2012 0726   CO2 28 03/03/2012 0726   GLUCOSE 105* 03/03/2012 0726   BUN 56* 03/03/2012 0726   CREATININE 6.14* 03/03/2012 0726   CALCIUM 7.8* 03/03/2012 0726   PROT 6.3 03/02/2012 0950   ALBUMIN 2.2* 03/03/2012 0726   AST 16 03/02/2012 0950   ALT 12 03/02/2012 0950   ALKPHOS 38* 03/02/2012 0950   BILITOT 0.2* 03/02/2012 0950   GFRNONAA 9* 03/03/2012 0726   GFRAA 11* 03/03/2012 0726    I/O last 3 completed shifts: In: 800 [I.V.:100; Blood:700]  Out: 279 [Other:279] Total I/O In: 360 [P.O.:360] Out: -    Diet Order: 60/70-2-2- RF diet with 1200 ml FR  Supplements/Tube Feeding:  none  IVF:    Estimated Nutritional Needs:   Kcal: 1900-2000 Protein: 85-95 gm Fluid: 1.2L FR  Food/Nutrition Related Hx: Pt reports eating well currently and prior to admit but with 10# recent weight loss.  States he was on dialysis for a year in [place] with no permanent dialysis access.  States he did not follow a renal diet.  "hard headed/stubborn"  NUTRITION DIAGNOSIS: -Not ready for diet/lifestyle change (NB-1.3).  Status: Ongoing  RELATED TO: dialysis diet  AS EVIDENCE BY: pt verbalization  MONITORING/EVALUATION(Goals): PO intake, labs, weight  EDUCATION NEEDS: -Education not appropriate at this time    DOCUMENTATION CODES Per approved criteria  -Not Applicable    Jeoffrey Massed 03/03/2012, 10:44 AM

## 2012-03-03 NOTE — Progress Notes (Signed)
Dr. Heloise Beecham notified of vein mapping results. Small clot in Right AC. [place], Rosanna Randy

## 2012-03-03 NOTE — ED Provider Notes (Signed)
I saw and evaluated the patient, reviewed the resident's note and I agree with the findings and plan. Patient presents for dialysis and shortness of breath. [name] a dialysis patient and has not been dialyzed since Saturday. He also was found to be anemic and have a GI bleed. I agree with the resident's EKG interpretation. Patient was admitted he was given calcium insulin and glucose for his worrisome hyperkalemia.  CRITICAL CARE Performed by: Billee Cashing   Total critical care time: 30  Critical care time was exclusive of separately billable procedures and treating other patients.  Critical care was necessary to treat or prevent imminent or life-threatening deterioration.  Critical care was time spent personally by me on the following activities: development of treatment plan with patient and/or surrogate as well as nursing, discussions with consultants, evaluation of patient's response to treatment, examination of patient, obtaining history from patient or surrogate, ordering and performing treatments and interventions, ordering and review of laboratory studies, ordering and review of radiographic studies, pulse oximetry and re-evaluation of patient's condition.  Juliet Rude. Rubin Payor, MD 03/03/12 1052

## 2012-03-03 NOTE — Progress Notes (Signed)
03/03/2012 Pharmacy: Pt taking po diet and po medication without difficulty. Hg 6.1 after 2 units blood transfused yesterday. Plan: change IV PPI qhs to PO PPI qhs Herby Abraham, Pharm.D. [phone] 03/03/2012 11:45 AM

## 2012-03-03 NOTE — Progress Notes (Addendum)
Subjective: Pt sitting in bed eating breakfast. Says he feels much better after dialysis. Post transfusion Hb last night increased to 7.3. New records from OSH last night do not shed much light on ongoing blood loss. Colonoscopy and EGD in 2012 were unremarkable, patient has had history of H. pylori in the past. Full records are in shadow chart.  Denies CP, SOB, N/V, dizziness, abdominal pain, diarrhea.  Objective: Vital signs in last 24 hours: Filed Vitals:   03/02/12 1833 03/02/12 1911 03/02/12 2146 03/03/12 0529  BP: 213/122 216/83 170/70 149/115  Pulse: 89 89 89 81  Temp:  98.3 F (36.8 C) 98.5 F (36.9 C) 98.8 F (37.1 C)  TempSrc:   Oral Oral  Resp: 17 17 18 18   Height:      Weight: 142 lb 10.2 oz (64.7 kg)  140 lb 12.8 oz (63.866 kg)   SpO2:  97% 100% 100%   Weight change:   Intake/Output Summary (Last 24 hours) at 03/03/12 0813 Last data filed at 03/02/12 1833  Gross per 24 hour  Intake    800 ml  Output    279 ml  Net    521 ml   Vitals reviewed. General: sitting in bed, eating breakfast HEENT: absent dentition, dental caries, PERRL, EOMI, no scleral icterus Cardiac: RRR, no rubs, murmurs or gallops. No JVD Pulm: clear to auscultation bilaterally, no wheezes, rales, or rhonchi Abd: soft, nontender, nondistended, BS present Ext: dry,warm and well perfused, no pedal edema Neuro: alert and oriented X3, cranial nerves II-XII grossly intact, strength and sensation to light touch equal in bilateral upper and lower extremities  Lab Results: Basic Metabolic Panel:  Lab 03/02/12 4098 03/02/12 1145 03/02/12 0951 03/02/12 0726  NA -- -- 143 142  K 6.5* -- 7.0* --  CL -- -- 104 102  CO2 -- -- 18* 18*  GLUCOSE -- -- 80 86  BUN -- -- 135* 135*  CREATININE -- -- 13.66* 13.55*  CALCIUM -- -- 7.4* 7.6*  MG -- 2.0 -- --  PHOS -- -- 7.3* --   Liver Function Tests:  Lab 03/02/12 0951 03/02/12 0950  AST -- 16  ALT -- 12  ALKPHOS -- 38*  BILITOT -- 0.2*  PROT -- 6.3    ALBUMIN 2.7* 2.7*   CBC:  Lab 03/02/12 2112 03/02/12 1145 03/02/12 0726  WBC -- 8.7 8.4  NEUTROABS -- -- 6.3  HGB 7.3* 5.3* --  HCT 20.9* 15.6* --  MCV -- 83.4 85.0  PLT -- 154 165   Cardiac Enzymes:  Lab 03/02/12 2339  CKTOTAL --  CKMB --  CKMBINDEX --  TROPONINI <0.30   CBG:  Lab 03/03/12 0748 03/02/12 1910 03/02/12 1328  GLUCAP 104* 84 82   Fasting Lipid Panel:  Lab 03/02/12 1059  CHOL 113  HDL 38*  LDLCALC 62  TRIG 65  CHOLHDL 3.0  LDLDIRECT --   Coagulation:  Lab 03/02/12 0950  LABPROT 14.5  INR 1.15   Anemia Panel:  Lab 03/02/12 0950  VITAMINB12 532  FOLATE --  FERRITIN 287  TIBC 212*  IRON 23*  RETICCTPCT 0.7   Urine Drug Screen: Drugs of Abuse     Component Value Date/Time   LABOPIA NONE DETECTED 03/02/2012 0728   COCAINSCRNUR POSITIVE* 03/02/2012 0728   LABBENZ NONE DETECTED 03/02/2012 0728   AMPHETMU NONE DETECTED 03/02/2012 0728   THCU NONE DETECTED 03/02/2012 0728   LABBARB NONE DETECTED 03/02/2012 0728    Urinalysis:  Lab 03/02/12 0728  COLORURINE  YELLOW  LABSPEC 1.016  PHURINE 7.5  GLUCOSEU 250*  HGBUR SMALL*  BILIRUBINUR NEGATIVE  KETONESUR NEGATIVE  PROTEINUR >300*  UROBILINOGEN 0.2  NITRITE NEGATIVE  LEUKOCYTESUR NEGATIVE    Micro Results: Recent Results (from the past 240 hour(s))  CULTURE, BLOOD (ROUTINE X 2)     Status: Normal (Preliminary result)   Collection Time   03/02/12  9:32 AM      Component Value Range Status Comment   Specimen Description BLOOD RIGHT HAND   Final    Special Requests BOTTLES DRAWN AEROBIC AND ANAEROBIC 10CC   Final    Culture  Setup Time 03/02/2012 16:13   Final    Culture     Final    Value:        BLOOD CULTURE RECEIVED NO GROWTH TO DATE CULTURE WILL BE HELD FOR 5 DAYS BEFORE ISSUING A FINAL NEGATIVE REPORT   Report Status PENDING   Incomplete   CULTURE, BLOOD (ROUTINE X 2)     Status: Normal (Preliminary result)   Collection Time   03/02/12  9:49 AM      Component Value  Range Status Comment   Specimen Description BLOOD RIGHT HAND   Final    Special Requests BOTTLES DRAWN AEROBIC AND ANAEROBIC 10CC   Final    Culture  Setup Time 03/02/2012 16:13   Final    Culture     Final    Value:        BLOOD CULTURE RECEIVED NO GROWTH TO DATE CULTURE WILL BE HELD FOR 5 DAYS BEFORE ISSUING A FINAL NEGATIVE REPORT   Report Status PENDING   Incomplete    Studies/Results: Dg Chest 2 View  03/02/2012  *RADIOLOGY REPORT*  Clinical Data: Cough, congestion, shortness of breath, weakness, smoker, hypertension, diabetes, end-stage renal disease on dialysis  CHEST - 2 VIEW  Comparison: None  Findings: Right jugular central venous catheter with tip projecting over right atrium. Mild enlargement of cardiac silhouette with pulmonary vascular congestion. Mediastinal contours normal. Minimal bronchitic changes. No segmental consolidation, pleural effusion or pneumothorax. Question 12 mm diameter nodular density mid to lower right chest, question nipple shadow. Bones demineralized.  IMPRESSION: Enlargement of cardiac silhouette with pulmonary vascular congestion. Question right nipple shadow; repeat PA chest radiograph with nipple markers recommended to exclude pulmonary nodule.   Original Report Authenticated By: Ulyses Southward, M.D.    Medications: I have reviewed the patient's current medications. Scheduled Meds:   . [COMPLETED] calcium gluconate 1 GM IV  1 g Intravenous Once  . [EXPIRED] darbepoetin      . darbepoetin (ARANESP) injection - DIALYSIS  200 mcg Intravenous Q Thu-HD  . [COMPLETED] dextrose  1 ampule Intravenous Once  . folic acid  1 mg Oral Daily  . hydrALAZINE  50 mg Oral TID  . [COMPLETED] insulin aspart  10 Units Intravenous Once  . levETIRAcetam  1,000 mg Oral BID  . multivitamin with minerals  1 tablet Oral Daily  . pantoprazole (PROTONIX) IV  40 mg Intravenous QHS  . [COMPLETED] paricalcitol      . sodium chloride  3 mL Intravenous Q12H  . thiamine  100 mg Oral  Daily   Or  . thiamine  100 mg Intravenous Daily  . [DISCONTINUED] paricalcitol  10 mcg Intravenous Q T,Th,Sa-HD   Continuous Infusions:  PRN Meds:.LORazepam, LORazepam, meclizine, [DISCONTINUED] sodium chloride, [DISCONTINUED] sodium chloride, [DISCONTINUED] alteplase, [DISCONTINUED] feeding supplement (NEPRO CARB STEADY), [DISCONTINUED] heparin, [DISCONTINUED] lidocaine, [DISCONTINUED] lidocaine-prilocaine, [DISCONTINUED] pentafluoroprop-tetrafluoroeth Assessment/Plan: Pt is a 56  y.o. yo male with a PMHx of ESRD on HD (TTS in [place], [place]), HTN, DM2 who was admitted on 03/02/2012 with symptoms of progressively worsening shortness of breath, which is likely secondary to noncompliance with dialysis schedule. His symptoms have resolved with dialysis  1) Shortness of breath - Likely secondary to volume overload in setting of noncompliance with dialysis schedule, as also confirmed by CXR showing vascular congestion. Anemia likely also contributing, Hb 5.3. Cardiac enzymes were negative. CXR did not demonstrate PNA.  Renal consulted, received dialysis last night. No longer with complaints this morning.  - Will check 2D echo - follow blood cultures  2) ESRD on HD (TTS in [place] at [facility])  lESRD possibly secondary to renal dysfunction caused by diabetes, uncontrolled HTN, cocaine abuse. Pt non-compliant w outpt dialysis in [place], appears he received most of his dialysis through [facility] when he presented to ED for volume overload  Renal service consulted - Dr. Allena Katz has seen. Renal function panel pending, if evidence of hyperphosphatemia (goal < 5.5), will add calcium based phosphate binder as he is also hypocalcemic on admission.  - Vasc surg to see for HD access - Tums, high dose Vit D  3) Acute normocytic anemia   Admission Hgb 6.1 -->5.3. S/p 2 U in dialysis. Prior records indicate frequent admissions for normocytic anemia requiring multiple transfusions  in the past. No clear etiology, FOBT + but no source on EGD, capsule, or colonoscopy performed in [place], as recent as December 2012 (full records in shadow chart). Does have h/o H pylori. Responded well to 2 U PRBCs in dialysis. No evidence of acute blood loss. Elevated LDH but normal haptoglobin.  Low retic ct. High ferritin and TIBC. GI consulted and following.  - May benefit from IV iron during dialysis w low saturation percentage - GI following, appreciate input  4) Hyperkalemia - likely secondary to ongoing ARB use and missing his dialysis session. Peaked T waves in ED. Has been treated with insulin/ D50 x 1 in ED.  S/P HD last night - Post dialysis labs pending this morning  5) Diabetes mellitus, type 2 - Non-insulin dependent, diet controlled at baseline. Will not check A1c at present given likely inaccuracy in setting of acute anemia.  - CBG monitoring and SSI  6) Hypertension - BP elevated, likely in setting of volume overload. Home medications include metoprolol, Hydralazine, Losartan. Expect improvement after HD.  - Continue Hydralazine, started back metoprolol today (hospital day 2) - Hold Losartan in setting of hyperkalemia.   7) Tobacco abuse -  Refuses nicotine patch inpatient. Ongoing 1 ppd.  - SW consult for cessation counseling - after acute and more serious issues addressed (ordered already placed).   8) Cocaine abuse -  UDS (+), admits to smoking crack cocaine, but will not discuss specifics.  - SW consult for cessation counseling - after acute and more serious issues addressed (ordered already placed).    Dispo: Disposition is deferred at this time, awaiting improvement of current medical problems.  Anticipated discharge in approximately 3 day(s).   The patient does not have current PCP (No primary provider on file.), therefore will be require OPC follow-up after discharge.   The patient does not have transportation limitations that hinder transportation to clinic  appointments.  .Services Needed at time of discharge: Y = Yes, Blank = No PT:   OT:   RN:   Equipment:   Other:     LOS: 1 day   Tonishia Steffy,  Eber Jones 03/03/2012, 8:13 AM

## 2012-03-03 NOTE — Progress Notes (Signed)
Internal Medicine Attending  Date: 03/03/2012  Patient name: [name] Medical record number: [id number] Date of birth: 10-16-1955 Age: 56 y.o. Gender: male  I saw and evaluated the patient, and discussed his care with resident Dr. Heloise Beecham. I reviewed the resident's note by Dr. Heloise Beecham and I agree with the findings and plans as documented in her note.  Dr. Dalphine Handing will take over as attending physician tomorrow 03/04/2012.
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
## 2012-03-03 NOTE — Progress Notes (Signed)
Patient ID: [name], male   DOB: 1955-12-19, 56 y.o.   MRN: [id number]  [facility] Progress Note    Subjective:   Pt without new complaints   Objective:   BP 142/84  Pulse 76  Temp 99 F (37.2 C) (Oral)  Resp 18  Ht 5\' 9"  (1.753 m)  Wt 63.866 kg (140 lb 12.8 oz)  BMI 20.79 kg/m2  SpO2 100%  Physical Exam: Gen:WD WN AAM in NAD CVS:no rub Resp:cta WUJ:WJXBJY Ext:no edema  Labs: BMET  Lab 03/03/12 0726 03/02/12 1407 03/02/12 0951 03/02/12 0950 03/02/12 0726  NA 137 -- 143 -- 142  K 3.7 6.5* 7.0* -- 6.5*  CL 98 -- 104 -- 102  CO2 28 -- 18* -- 18*  GLUCOSE 105* -- 80 -- 86  BUN 56* -- 135* -- 135*  CREATININE 6.14* -- 13.66* -- 13.55*  ALBUMIN 2.2* -- 2.7* 2.7* --  CALCIUM 7.8* -- 7.4* -- 7.6*  PHOS 5.5* -- 7.3* -- --   CBC  Lab 03/03/12 1123 03/03/12 0726 03/02/12 2112 03/02/12 1145 03/02/12 0726  WBC -- 4.3 -- 8.7 8.4  NEUTROABS -- -- -- -- 6.3  HGB 6.5* 6.1* 7.3* 5.3* --  HCT 19.2* 17.6* 20.9* 15.6* --  MCV -- 84.2 -- 83.4 85.0  PLT -- 142* -- 154 165    @IMGRELPRIORS @ Medications:      . calcium carbonate  400 mg of elemental calcium Oral BID WC  . cholecalciferol  1,000 Units Oral Daily  . [EXPIRED] darbepoetin      . darbepoetin (ARANESP) injection - DIALYSIS  200 mcg Intravenous Q Thu-HD  . folic acid  1 mg Oral Daily  . hydrALAZINE  50 mg Oral TID  . levETIRAcetam  1,000 mg Oral BID  . metoprolol tartrate  50 mg Oral BID  . multivitamin with minerals  1 tablet Oral Daily  . pantoprazole  40 mg Oral QHS  . [COMPLETED] paricalcitol      . sodium chloride  3 mL Intravenous Q12H  . thiamine  100 mg Oral Daily   Or  . thiamine  100 mg Intravenous Daily  . [DISCONTINUED] pantoprazole (PROTONIX) IV  40 mg Intravenous QHS  . [DISCONTINUED] paricalcitol  10 mcg Intravenous Q T,Th,Sa-HD     Assessment/ Plan:    1. Anemia - Hgb 5.3, 7.3 on 11/6, heme positive stool, no outpatient Epogen; seen by GI with workup pending. S/p  Transfusion of 2 U of PRBCs, also Aranesp 200 mcg with HD yesterday.  Acute drop in Hgb with heme + stools.  The results of a GI evaluation over a year ago does not negate the need for further workup during this hospitalization as he has a h/o H pylori in the past and has ongoing blood loss.  Moreover, this anemia is out of proportion for ESRD 2. Hyperkalemia - resolved after HD 3. ESRD - HD on TTS in [place]; moved to [place] without placement at local center.  Will cont with TTS schedule for now. 4. Hypertension/volume - BP 142/84 on Hydralazine 50 mg tid, but other outpatient meds include Metoprolol 50 mg bid and Losartan 100 mg qd; current wt 65.3 kg with EDW 64.5 kg. 5. Metabolic bone disease - Ca 7.4 on 2.5Ca bath, last P 7.2 and iPTH 339 on 11/5; on Zemplar 10 mcg, currently no binders, but previously Renvela and Phoslo. 6. Nutrition - Last Alb 3.3 on 11/5. 7. Hx GI bleed - evaluated @ [facility] in 11/2011 for  hematemesis with heme + stool; EGD was to be done as outpatient so will need inpt EGD given ongoing blood loss anemia. 8. Hx Hepatitis C 9. Dispo- will need vascular access and a permanent address (has been approved at [facility] once AVG/AVF is placed) before we can establish outpt HD   [name] 03/03/2012, 1:15 PM

## 2012-03-03 NOTE — Progress Notes (Signed)
CRITICAL VALUE ALERT  Critical value received:  Hgb: 6.1  Date of notification:  03/03/12  Time of notification:  08:45  Critical value read back:yes  Nurse who received alert:  Adele Barthel rn/ Shon Baton  MD notified (1st page): Dr. Heloise Beecham  Time of first page:  08:46  MD notified (2nd page):  Time of second page:  Responding MD:  Dr. Heloise Beecham  Time MD responded:  08:50  Comments: Md. Stated that we are going to watch him for now. Low hemoglobin a chronic issue for pt. Will continue to monitor and notify if pt becomes symptomatic. Md will order another hgb lab in 6 hours. Adele Barthel

## 2012-03-03 NOTE — Progress Notes (Signed)
Right  Upper Extremity Vein Map    Cephalic  Segment Diameter Depth Comment  1. Axilla 4.22mm mm   2. Mid upper arm 6.28mm mm   3. Above AC 5.27mm mm   4. In [facility] 2.47mm mm Thrombosis  5. Below AC 4.102mm mm   6. Mid forearm 4.50mm mm   7. Wrist 3.97mm mm Branch                  Basilic  Segment Diameter Depth Comment  1. Axilla 12.51mm 6.42mm   2. Mid upper arm 7.10mm 10.55mm   3. Above [facility] 6.74mm 6.20mm   4. In [facility] 3.40mm 4.45mm   5. Below AC 2.63mm 2.57mm Branch  6. Mid forearm 2.6mm 1.77mm   7. Wrist 1.45mm 0.28mm                    Left Upper Extremity Vein Map    Cephalic  Segment Diameter Depth Comment  1. Axilla 4.75mm mm   2. Mid upper arm 4.28mm mm   3. Above AC 2.72mm mm   4. In AC 2.62mm mm   5. Below AC 1.28mm mm   6. Mid forearm   Unable to visualize.  7. Wrist   Unable to visualize.                  Basilic  Segment Diameter Depth Comment  1. Axilla 4.57mm 11.2mm   2. Mid upper arm 4.76mm 8.78mm   3. Above AC 3.95mm 5.48mm   4. In [facility]   Unable to visualize.  5. Below AC   Unable to visualize.  6. Mid forearm   Unable to visualize.  7. Wrist   Unable to visualize.                    03/03/2012 12:48 PM Gertie Fey, RDMS, RDCS

## 2012-03-03 NOTE — Progress Notes (Addendum)
CRITICAL VALUE ALERT  Critical value received:  Hgb 6.0  Date of notification:  03/03/12  Time of notification:  1750  Critical value read back:yes  Nurse who received alert:  Adele Barthel  MD notified (1st page): Internal Med TS (After hours)  Time of first page: 1825  MD notified (2nd page):  Time of second page:  Responding MD:  Internal Med Teaching Service  Time MD responded:  1834  Comments: No new orders received.

## 2012-03-03 NOTE — Progress Notes (Signed)
PT Cancellation Note  Patient Details Name: [name] MRN: [id number] DOB: 17-Feb-1956   Cancelled Treatment:    Reason Eval/Treat Not Completed: Medical issues which prohibited therapy;Other (comment) (Pt's current Hgb is 6.1 after transfusions last pm).  Will follow up tomorrow and proceed as pt. Able.   Ferman Hamming 03/03/2012, 10:54 AM Weldon Picking PT Acute Rehab Services [phone] Beeper [phone]

## 2012-03-04 LAB — CBC
MCH: 28.9 pg (ref 26.0–34.0)
MCV: 84.3 fL (ref 78.0–100.0)
Platelets: 135 10*3/uL — ABNORMAL LOW (ref 150–400)
RBC: 2.04 MIL/uL — ABNORMAL LOW (ref 4.22–5.81)

## 2012-03-04 LAB — RENAL FUNCTION PANEL
Albumin: 2.4 g/dL — ABNORMAL LOW (ref 3.5–5.2)
BUN: 78 mg/dL — ABNORMAL HIGH (ref 6–23)
Chloride: 97 mEq/L (ref 96–112)
Creatinine, Ser: 7.88 mg/dL — ABNORMAL HIGH (ref 0.50–1.35)

## 2012-03-04 MED ORDER — TRAMADOL HCL 50 MG PO TABS
50.0000 mg | ORAL_TABLET | Freq: Four times a day (QID) | ORAL | Status: DC | PRN
  Administered 2012-03-04 – 2012-03-06 (×5): 50 mg via ORAL
  Filled 2012-03-04 (×5): qty 1

## 2012-03-04 MED ORDER — LIDOCAINE HCL (PF) 1 % IJ SOLN: 5.0000 mL | INTRAMUSCULAR | Status: DC | PRN

## 2012-03-04 MED ORDER — SODIUM CHLORIDE 0.9 % IV SOLN
125.0000 mg | INTRAVENOUS | Status: DC
  Administered 2012-03-04 – 2012-03-07 (×2): 125 mg via INTRAVENOUS
  Filled 2012-03-04 (×4): qty 10

## 2012-03-04 MED ORDER — SODIUM CHLORIDE 0.9 % IV SOLN: 100.0000 mL | INTRAVENOUS | Status: DC | PRN

## 2012-03-04 MED ORDER — PENTAFLUOROPROP-TETRAFLUOROETH EX AERO: 1.0000 "application " | INHALATION_SPRAY | CUTANEOUS | Status: DC | PRN

## 2012-03-04 MED ORDER — ALTEPLASE 2 MG IJ SOLR: 2.0000 mg | Freq: Once | INTRAMUSCULAR | Status: DC | PRN

## 2012-03-04 MED ORDER — HEPARIN SODIUM (PORCINE) 1000 UNIT/ML DIALYSIS
1000.0000 [IU] | INTRAMUSCULAR | Status: DC | PRN
  Administered 2012-03-04: 1000 [IU] via INTRAVENOUS_CENTRAL

## 2012-03-04 MED ORDER — NEPRO/CARBSTEADY PO LIQD: 237.0000 mL | ORAL | Status: DC | PRN

## 2012-03-04 MED ORDER — LIDOCAINE-PRILOCAINE 2.5-2.5 % EX CREA: 1.0000 "application " | TOPICAL_CREAM | CUTANEOUS | Status: DC | PRN

## 2012-03-04 NOTE — Progress Notes (Signed)
PT Cancellation Note  Patient Details Name: [name] MRN: [id number] DOB: 08-29-1955   Cancelled Treatment:    Reason Eval/Treat Not Completed: Medical issues which prohibited therapy (pt's hgb continues to decline, now at 5.9)   Rickelle Sylvestre, [place] 03/04/2012, 12:28 PM

## 2012-03-04 NOTE — Progress Notes (Addendum)
[facility]  Subjective:  Currently on dialysis via R IJ tunneled dialysis cath.  He received 2 U PRBC in dialysis for hgb 5.9   Objective: Vital signs in last 24 hours: Blood pressure 195/66, pulse 70, temperature 98.9 F (37.2 C), temperature source Axillary, resp. rate 16, height 5\' 9"  (1.753 m), weight 64.6 kg (142 lb 6.7 oz), SpO2 97.00%.    PHYSICAL EXAM General--awake, alert, quiet Chest--TDC R IJ, clear Heart--no rub Abd--nontender Extr--no edema R handed--IV in R forearm  Lab Results:   Lab 03/04/12 0500 03/03/12 0726 03/02/12 1407 03/02/12 0951  NA 136 137 -- 143  K 4.1 3.7 6.5* --  CL 97 98 -- 104  CO2 27 28 -- 18*  BUN 78* 56* -- 135*  CREATININE 7.88* 6.14* -- 13.66*  ALB -- -- -- --  GLUCOSE 125* -- -- --  CALCIUM 7.6* 7.8* -- 7.4*  PHOS 5.3* 5.5* -- 7.3*     Basename 03/04/12 0500 03/04/12 0016 03/03/12 0726  WBC 4.9 -- 4.3  HGB 5.9* 6.0* --  HCT 17.2* 17.6* --  PLT 135* -- 142*   I have reviewed the patient's current medications.  Assessment/ Plan:   1. Anemia - Received 2 Units PRBC today.  Fe/TIBC 11% sat and ferritin 207 (Fe deficiency).  Will Rx IV Fe in dialysis (Nulecit 125 x 8).  Bone marrow to be done in [place] 19 Nov.   2. Hyperkalemia - resolved after HD 3. ESRD - HD on TTS in [place]; moved to [place] without placement at local center. Will cont with TTS schedule for now.  [facility] has been contacted to place permanent vascular access.  Vein mapping was done yesterday.   4. Hypertension/volume - BP 142/84 on Hydralazine 50 mg tid and Metoprolol 50 mg bid ;  BP in HD today 165/70--UF goal  2  L.  I'd suggest changing hydralazine to BID (will improve compliance).   5. Metabolic bone disease - Ca 7.4 on 2.5Ca bath, last P 7.2 and iPTH 339 on 11/5; on Zemplar 10 mcg, currently no binders, but previously Renvela and Phoslo. 6. Nutrition - Last Alb 3.3 on 11/5. 7. Hx GI bleed - evaluated @ [facility] in 11/2011 for hematemesis with  heme + stool; EGD was to be done as outpatient so will need inpt EGD given ongoing blood loss anemia.  Does GI plan to do EGD? 8. Hx Hepatitis C 9. Dispo- will need vascular access and a permanent address (has been approved at [facility] once AVG/AVF is placed) before we can establish outpt HD     LOS: 2 days   Tenaya Hilyer F 03/04/2012,11:05 AM   .labalb
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
## 2012-03-04 NOTE — Evaluation (Signed)
Physical Therapy Evaluation Patient Details Name: [name] MRN: [id number] DOB: [birth year] Today's Date: 03/04/2012 Time: 1453-1510 PT Time Calculation (min): 17 min  PT Assessment / Plan / Recommendation Clinical Impression  pt admitted with progressive SOB whether exerting himself or not.  Found to be profoundly anemic, 2 more units of blood given.  Mobility/gait is mildly unsteady at this point, will continue to see him on acute.    PT Assessment  Patient needs continued PT services    Follow Up Recommendations  No PT follow up;Supervision - Intermittent;Supervision for mobility/OOB    Does the patient have the potential to tolerate intense rehabilitation      Barriers to Discharge Decreased caregiver support      Equipment Recommendations  None recommended by PT    Recommendations for Other Services     Frequency Min 3X/week    Precautions / Restrictions Precautions Precautions: Fall   Pertinent Vitals/Pain       Mobility  Bed Mobility Bed Mobility: Supine to Sit;Sitting - Scoot to [place] of Bed;Sit to Supine;Scooting to [facility] Supine to Sit: 5: Supervision Sitting - Scoot to Edge of Bed: 5: Supervision Sit to Supine: 5: Supervision Scooting to [facility]: 5: Supervision Details for Bed Mobility Assistance: did not need assist, but moved to EOB with effort Transfers Transfers: Sit to Stand;Stand to Sit Sit to Stand: 5: Supervision Stand to Sit: 5: Supervision Details for Transfer Assistance: used UE's to stand Ambulation/Gait Ambulation/Gait Assistance: 4: Min guard Ambulation Distance (Feet): 80 Feet Assistive device: None Ambulation/Gait Assistance Details: mildly usteady with noticeable but minor staggering that he could self recover from.  Pt reported that he was getting tired, but not necessarily dizzy Gait Pattern: Step-through pattern;Decreased step length - right;Decreased step length - left;Decreased stride length;Scissoring (variable BOS and some stepping  to maintain balance) Stairs: No    Shoulder Instructions     Exercises     PT Diagnosis: Other (comment) (decr activity tolerance)  PT Problem List: Decreased activity tolerance;Decreased balance;Decreased mobility PT Treatment Interventions: Gait training;Functional mobility training;Therapeutic activities;Balance training;Patient/family education   PT Goals Acute Rehab PT Goals PT Goal Formulation: With patient Time For Goal Achievement: 03/11/12 Potential to Achieve Goals: Good Pt will go Sit to Stand: Independently PT Goal: Sit to Stand - Progress: Goal set today Pt will Transfer Bed to Chair/Chair to Bed: Independently PT Transfer Goal: Bed to Chair/Chair to Bed - Progress: Goal set today Pt will Ambulate: >150 feet;Independently PT Goal: Ambulate - Progress: Goal set today Pt will Go Up / Down Stairs: 3-5 stairs;with modified independence;with rail(s) PT Goal: Up/Down Stairs - Progress: Goal set today  Visit Information  Last PT Received On: 03/04/12 Assistance Needed: +1 Reason Eval/Treat Not Completed: Medical issues which prohibited therapy (pt's hgb continues to decline, now at 5.9)    Subjective Data  Subjective: What do you  have to do with me? Patient Stated Goal: Just get out of here   Prior Functioning  Home Living Lives With: Other (Comment) (buddy) Available Help at Discharge: Friend(s);Other (Comment) Type of Home: Apartment Home Access: Stairs to enter Entrance Stairs-Number of Steps: several Home Layout: One level Bathroom Shower/Tub: Walk-in Contractor: Handicapped height Home Adaptive Equipment: None Prior Function Level of Independence: Independent Able to Take Stairs?: Yes Driving: Yes Vocation: On disability Communication Communication: No difficulties    Cognition  Overall Cognitive Status: Appears within functional limits for tasks assessed/performed Arousal/Alertness: Awake/alert Orientation Level: Appears intact  for tasks assessed Behavior During Session:  WFL for tasks performed (curt with responses)    Extremity/Trunk Assessment Right Lower Extremity Assessment RLE ROM/Strength/Tone: Within functional levels (mildly weak df) Left Lower Extremity Assessment LLE ROM/Strength/Tone: WFL for tasks assessed   Balance Balance Balance Assessed: Yes Static Sitting Balance Static Sitting - Balance Support: Feet supported Static Sitting - Level of Assistance: 5: Stand by assistance  End of Session PT - End of Session Activity Tolerance: Patient limited by fatigue;Other (comment) (OR lack of desire to continue) Patient left: in bed;with call bell/phone within reach;with nursing in room Nurse Communication: Mobility status  GP     [name], Eliseo Gum 03/04/2012, 3:24 PM  03/04/2012  Zwolle Bing, PT [phone] (pager)

## 2012-03-05 LAB — LEGIONELLA ANTIGEN, URINE: Legionella Antigen, Urine: NEGATIVE

## 2012-03-05 LAB — TYPE AND SCREEN
Antibody Screen: NEGATIVE
Unit division: 0
Unit division: 0
Unit division: 0

## 2012-03-05 LAB — HEMOGLOBIN AND HEMATOCRIT, BLOOD
HCT: 24.5 % — ABNORMAL LOW (ref 39.0–52.0)
Hemoglobin: 8.3 g/dL — ABNORMAL LOW (ref 13.0–17.0)
Hemoglobin: 7.8 g/dL — ABNORMAL LOW (ref 13.0–17.0)

## 2012-03-05 MED ORDER — BENAZEPRIL HCL 10 MG PO TABS
10.0000 mg | ORAL_TABLET | Freq: Every day | ORAL | Status: DC
  Administered 2012-03-05 – 2012-03-08 (×3): 10 mg via ORAL
  Filled 2012-03-05 (×4): qty 1

## 2012-03-05 MED ORDER — HYDROCODONE-ACETAMINOPHEN 5-325 MG PO TABS
1.0000 | ORAL_TABLET | Freq: Once | ORAL | Status: AC
  Administered 2012-03-05: 2 via ORAL
  Filled 2012-03-05 (×2): qty 1

## 2012-03-05 MED ORDER — HYDRALAZINE HCL 50 MG PO TABS
50.0000 mg | ORAL_TABLET | Freq: Two times a day (BID) | ORAL | Status: DC
  Administered 2012-03-05 – 2012-03-08 (×6): 50 mg via ORAL
  Filled 2012-03-05 (×7): qty 1

## 2012-03-05 NOTE — Evaluation (Signed)
Occupational Therapy Evaluation Patient Details Name: [name] MRN: [id number] DOB: 1955-09-15 Today's Date: 03/05/2012 Time: 7846-9629 OT Time Calculation (min): 14 min  OT Assessment / Plan / Recommendation Clinical Impression  Pt admitted with SOB. history of end-stage renal disease on hemodialysis, hypertension, diabetes mellitus, COPD, and hepatitis C. Will benefit from continued OT services to ensure safe d/c home.    OT Assessment  Patient needs continued OT Services    Follow Up Recommendations  No OT follow up    Barriers to Discharge Decreased caregiver support Pt is home alone majority of day.  Equipment Recommendations  None recommended by OT    Recommendations for Other Services    Frequency  Min 2X/week    Precautions / Restrictions Precautions Precautions: Fall   Pertinent Vitals/Pain See vitals    ADL  Upper Body Bathing: Simulated;Set up Where Assessed - Upper Body Bathing: Unsupported sitting Upper Body Dressing: Simulated;Set up Where Assessed - Upper Body Dressing: Unsupported sitting Toilet Transfer: Simulated;Min guard Toilet Transfer Method: Sit to Barista: Other (comment) (bed) Equipment Used: Gait belt Transfers/Ambulation Related to ADLs: min guard for safety ambulating in room ADL Comments: ADL assessment limited due to pt adamantly requesting to return to bed.Required max encouragement to participate in session.     OT Diagnosis: Generalized weakness  OT Problem List: Decreased activity tolerance;Impaired balance (sitting and/or standing);Decreased strength OT Treatment Interventions: Self-care/ADL training;Therapeutic activities;Balance training;Patient/family education;Energy conservation   OT Goals Acute Rehab OT Goals OT Goal Formulation: With patient Time For Goal Achievement: 03/12/12 Potential to Achieve Goals: Good ADL Goals Pt Will Perform Grooming: Independently;Standing at sink ADL Goal: Grooming  - Progress: Goal set today Pt Will Perform Upper Body Dressing: Independently;Sitting, chair;Sitting, bed ADL Goal: Upper Body Dressing - Progress: Goal set today Pt Will Perform Lower Body Dressing: Independently;Sit to stand from chair;Sit to stand from bed ADL Goal: Lower Body Dressing - Progress: Goal set today Pt Will Transfer to Toilet: Independently;Ambulation;Comfort height toilet ADL Goal: Toilet Transfer - Progress: Goal set today Miscellaneous OT Goals Miscellaneous OT Goal #1: Pt will independently verbalize and incorporate 2 energy conservation techniques into ADL activities. OT Goal: Miscellaneous Goal #1 - Progress: Goal set today  Visit Information  Last OT Received On: 03/05/12 Assistance Needed: +1    Subjective Data  Subjective: "I'm too tired to do anything"   Prior Functioning     Home Living Lives With: Other (Comment) ("homeboy") Available Help at Discharge: Friend(s);Available PRN/intermittently Type of Home: Apartment Home Access: Stairs to enter Entrance Stairs-Number of Steps: several Home Layout: One level Bathroom Shower/Tub: Walk-in Contractor: Handicapped height Home Adaptive Equipment: Shower chair with back Prior Function Level of Independence: Independent Able to Take Stairs?: Yes Driving: Yes Vocation: On disability Communication Communication: No difficulties         Vision/Perception     Cognition  Overall Cognitive Status: Appears within functional limits for tasks assessed/performed Arousal/Alertness: Awake/alert Orientation Level: Appears intact for tasks assessed Behavior During Session: [facility] for tasks performed    Extremity/Trunk Assessment Right Upper Extremity Assessment RUE ROM/Strength/Tone: Deficits RUE ROM/Strength/Tone Deficits: Hand swollen and 3+/5. Pt reports his hand is swollen from blood draw and IV.  4/5 throughout elbow and shoulder. Left Upper Extremity Assessment LUE  ROM/Strength/Tone: WFL for tasks assessed (4/5 throughout)     Mobility Bed Mobility Bed Mobility: Supine to Sit;Sitting - Scoot to Edge of Bed;Sit to Supine Supine to Sit: 5: Supervision Sitting - Scoot to [place] of  Bed: 5: Supervision Sit to Supine: 5: Supervision Details for Bed Mobility Assistance: increased time and effort Transfers Transfers: Sit to Stand;Stand to Sit Sit to Stand: 4: Min guard;From bed Stand to Sit: 5: Supervision;To bed Details for Transfer Assistance: required increased time     Shoulder Instructions     Exercise     Balance     End of Session OT - End of Session Equipment Utilized During Treatment: Gait belt Activity Tolerance: Patient limited by fatigue Patient left: in bed;with call bell/phone within reach Nurse Communication: Mobility status  GO    03/05/2012 Cipriano Mile OTR/L Pager [phone] Office [phone]  Cipriano Mile 03/05/2012, 3:08 PM

## 2012-03-05 NOTE — Progress Notes (Signed)
[facility]  Subjective:  Awake, alert, watching TV.  Says he's getting enough to eat.     Objective: Vital signs in last 24 hours: Blood pressure 189/85, pulse 67, temperature 98.6 F (37 C), temperature source Oral, resp. rate 18, height 5\' 9"  (1.753 m), weight 64.8 kg (142 lb 13.7 oz), SpO2 98.00%.    PHYSICAL EXAM General--as above Chest--TDC R IJ, clear  Heart--no rub  Abd--nontender  Extr--no edema  R handed--IV in R forearm   Lab Results:   Lab 03/04/12 0500 03/03/12 0726 03/02/12 1407 03/02/12 0951  NA 136 137 -- 143  K 4.1 3.7 6.5* --  CL 97 98 -- 104  CO2 27 28 -- 18*  BUN 78* 56* -- 135*  CREATININE 7.88* 6.14* -- 13.66*  ALB -- -- -- --  GLUCOSE 125* -- -- --  CALCIUM 7.6* 7.8* -- 7.4*  PHOS 5.3* 5.5* -- 7.3*     Basename 03/05/12 0510 03/04/12 1243 03/04/12 0500 03/03/12 0726  WBC -- -- 4.9 4.3  HGB 7.9* 8.2* -- --  HCT 23.0* 23.9* -- --  PLT -- -- 135* 142*   I have reviewed the patient's current medications.  Assessment/Plan: 1. Anemia - Received 2 Units PRBC 16 Nov. Fe/TIBC 11% sat and ferritin 207 (Fe deficiency). Will Rx IV Fe in dialysis (Nulecit 125 x 8). Bone marrow to be done in [place] 19 Nov.  2. Hyperkalemia - resolved after HD 3. ESRD - HD on TTS in [place]; moved to [place] without placement at local center. Will cont with TTS schedule for now. [facility] has been contacted to place permanent vascular access. Vein mapping was done yesterday.  4. Hypertension/volume - BP 189/85 on Hydralazine 50 mg BID and Metoprolol 50 mg bid.  Will add benazepril 10/d  5. Metabolic bone disease - Ca 7.4 on 2.5Ca bath, last P 5.3 and iPTH 339 on 11/5; on Zemplar 10 mcg, currently no binders, but previously Renvela and Phoslo. 6. Nutrition - Last Alb 3.3 on 11/5. 7. Hx GI bleed - evaluated @ [facility] in 11/2011 for hematemesis with heme + stool; EGD was to be done as outpatient so will need inpt EGD given ongoing blood loss anemia. Does GI  plan to do EGD? 8. Hx Hepatitis C--no plans for treatment  now 9. Dispo- will need vascular access and a permanent address (has been approved at [facility] once AVG/AVF is placed) before we can establish outpt HD     LOS: 3 days   Arbor Leer F 03/05/2012,12:00 PM   .labalb

## 2012-03-05 NOTE — Progress Notes (Signed)
Subjective: No acute events overnight, tolerated HD yesterday, without complaints states that he intends to stay in [facility] permenantly   Objective: Vital signs in last 24 hours: Filed Vitals:   03/04/12 2025 03/05/12 0642 03/05/12 0958 03/05/12 1346  BP: 171/69 169/65 189/85 162/75  Pulse: 72 64 67 67  Temp: 99 F (37.2 C) 98.2 F (36.8 C) 98.6 F (37 C) 98.3 F (36.8 C)  TempSrc: Oral Oral Oral Oral  Resp: 18 18 18 18   Height:      Weight: 142 lb 13.7 oz (64.8 kg)     SpO2: 97% 97% 98% 98%   Weight change: -1 lb 15.7 oz (-0.9 kg)  Intake/Output Summary (Last 24 hours) at 03/05/12 1548 Last data filed at 03/05/12 1347  Gross per 24 hour  Intake   1200 ml  Output    325 ml  Net    875 ml   Physical Exam: General: watching tv in bed, NAD Cardiac: RRR Pulm: CTAB Abd: soft, nontender, nondistended, BS present Ext: dry,warm and well perfused, no pedal edema Neuro: alert and oriented, nonfocal  Lab Results: Basic Metabolic Panel:  Lab 03/04/12 1610 03/03/12 0726 03/02/12 1145  NA 136 137 --  K 4.1 3.7 --  CL 97 98 --  CO2 27 28 --  GLUCOSE 125* 105* --  BUN 78* 56* --  CREATININE 7.88* 6.14* --  CALCIUM 7.6* 7.8* --  MG -- -- 2.0  PHOS 5.3* 5.5* --   Liver Function Tests:  Lab 03/04/12 0500 03/03/12 0726 03/02/12 0950  AST -- -- 16  ALT -- -- 12  ALKPHOS -- -- 38*  BILITOT -- -- 0.2*  PROT -- -- 6.3  ALBUMIN 2.4* 2.2* --   CBC:  Lab 03/05/12 1237 03/05/12 0510 03/04/12 0500 03/03/12 0726 03/02/12 0726  WBC -- -- 4.9 4.3 --  NEUTROABS -- -- -- -- 6.3  HGB 8.3* 7.9* -- -- --  HCT 24.5* 23.0* -- -- --  MCV -- -- 84.3 84.2 --  PLT -- -- 135* 142* --   Cardiac Enzymes:  Lab 03/03/12 0726 03/02/12 2339  CKTOTAL -- --  CKMB -- --  CKMBINDEX -- --  TROPONINI <0.30 <0.30   CBG:  Lab 03/03/12 0748 03/02/12 1910 03/02/12 1328  GLUCAP 104* 84 82   Fasting Lipid Panel:  Lab 03/02/12 1059  CHOL 113  HDL 38*  LDLCALC 62  TRIG 65  CHOLHDL 3.0    LDLDIRECT --   Coagulation:  Lab 03/02/12 0950  LABPROT 14.5  INR 1.15   Anemia Panel:  Lab 03/02/12 0950  VITAMINB12 532  FOLATE 7.3  FERRITIN 287  TIBC 212*  IRON 23*  RETICCTPCT 0.7   Urine Drug Screen: Drugs of Abuse     Component Value Date/Time   LABOPIA NONE DETECTED 03/02/2012 0728   COCAINSCRNUR POSITIVE* 03/02/2012 0728   LABBENZ NONE DETECTED 03/02/2012 0728   AMPHETMU NONE DETECTED 03/02/2012 0728   THCU NONE DETECTED 03/02/2012 0728   LABBARB NONE DETECTED 03/02/2012 0728    Urinalysis:  Lab 03/02/12 0728  COLORURINE YELLOW  LABSPEC 1.016  PHURINE 7.5  GLUCOSEU 250*  HGBUR SMALL*  BILIRUBINUR NEGATIVE  KETONESUR NEGATIVE  PROTEINUR >300*  UROBILINOGEN 0.2  NITRITE NEGATIVE  LEUKOCYTESUR NEGATIVE    Micro Results: Recent Results (from the past 240 hour(s))  CULTURE, BLOOD (ROUTINE X 2)     Status: Normal (Preliminary result)   Collection Time   03/02/12  9:32 AM  Component Value Range Status Comment   Specimen Description BLOOD RIGHT HAND   Final    Special Requests BOTTLES DRAWN AEROBIC AND ANAEROBIC 10CC   Final    Culture  Setup Time 03/02/2012 16:13   Final    Culture     Final    Value:        BLOOD CULTURE RECEIVED NO GROWTH TO DATE CULTURE WILL BE HELD FOR 5 DAYS BEFORE ISSUING A FINAL NEGATIVE REPORT   Report Status PENDING   Incomplete   CULTURE, BLOOD (ROUTINE X 2)     Status: Normal (Preliminary result)   Collection Time   03/02/12  9:49 AM      Component Value Range Status Comment   Specimen Description BLOOD RIGHT HAND   Final    Special Requests BOTTLES DRAWN AEROBIC AND ANAEROBIC 10CC   Final    Culture  Setup Time 03/02/2012 16:13   Final    Culture     Final    Value:        BLOOD CULTURE RECEIVED NO GROWTH TO DATE CULTURE WILL BE HELD FOR 5 DAYS BEFORE ISSUING A FINAL NEGATIVE REPORT   Report Status PENDING   Incomplete    Studies/Results: No results found. Medications: I have reviewed the patient's current  medications. Scheduled Meds:    . benazepril  10 mg Oral Daily  . calcium carbonate  400 mg of elemental calcium Oral BID WC  . cholecalciferol  1,000 Units Oral Daily  . darbepoetin (ARANESP) injection - DIALYSIS  200 mcg Intravenous Q Thu-HD  . ferric gluconate (FERRLECIT/NULECIT) IV  125 mg Intravenous Q T,Th,Sa-HD  . folic acid  1 mg Oral Daily  . hydrALAZINE  50 mg Oral BID  . levETIRAcetam  1,000 mg Oral BID  . metoprolol tartrate  50 mg Oral BID  . multivitamin with minerals  1 tablet Oral Daily  . pantoprazole  40 mg Oral QHS  . sodium chloride  3 mL Intravenous Q12H  . thiamine  100 mg Oral Daily   Or  . thiamine  100 mg Intravenous Daily  . [DISCONTINUED] hydrALAZINE  50 mg Oral TID   Continuous Infusions:  PRN Meds:.LORazepam, LORazepam, meclizine, traMADol Assessment/Plan: Pt is a 56 y.o. yo male with a PMHx of ESRD on HD (TTS in [place], [place]), HTN, DM2 who was admitted on 03/02/2012 with symptoms of progressively worsening shortness of breath, which is likely secondary to noncompliance with dialysis schedule. His symptoms have resolved with dialysis.  1) Shortness of breath: Likely secondary to volume overload in setting of noncompliance with dialysis schedule, Anemia likely also contributing, Hb 5.3. Normal systolic function with LVH on 2D ECHO with preserved ejection fraction 60-65%   -cont HD per Renal Service  2) ESRD on HD: likely secondary to renal dysfunction caused by diabetes, uncontrolled HTN, cocaine abuse. Previously non-compliant with HD, s/p vein mapping, outpt HD in [place] to be coordinated per Renal Service with [facility]  - Vasc surg for perm vas cath early this week - cont HD TTS with electrolyte monitorinf  Lab 03/04/12 0500 03/03/12 0726 03/02/12 0951 03/02/12 0726  CREATININE 7.88* 6.14* 13.66* 13.55*     3) Acute normocytic anemia: likely multifactorial, s/p multiple admission with frequent blood transfusions, negative prior work up  including EGD, capsule, and colonoscopy performed in [place], as recent as December 2012 (full records in shadow chart). No evidence of acute blood loss. Elevated LDH but normal haptoglobin.  Low retic ct. High  ferritin and TIBC. GI consulted and following.  - Bone Marrow bx planned for this Tuesday per IR -receiving Aranesp and iron with HD - GI following, appreciate input - appreciate Hematology recs   Lab 03/05/12 1237 03/05/12 0510 03/04/12 1243 03/04/12 0500 03/04/12 0016  HGB 8.3* 7.9* 8.2* 5.9* 6.0*     4) Hyperkalemia: resolved  Lab 03/04/12 0500 03/03/12 0726 03/02/12 1407 03/02/12 0951 03/02/12 0726  K 4.1 3.7 6.5* 7.0* 6.5*     5) Diabetes mellitus, type 2: Non-insulin dependent, diet controlled at baseline. Will not check A1c at present given likely inaccuracy in setting of acute anemia.  - cont CBG monitoring and SSI  CBG (last 3)   Basename 03/03/12 0748 03/02/12 1910  GLUCAP 104* 84    6) Hypertension - above goal on metoprolol and Hydralazine with Losartan held in setting of hyperkalemia.  - Hydralazine changed to 50 bid from tid dosing - Benazepril added today per Renal - if more bp control needed will need to increase hydralazine dosage (no 75 mg pill available to divide 150mg  daily from tid dosing to bid)  7) Polysubstance abuse: Refuses nicotine patch inpatient. Ongoing 1 ppd and crack cocaine use  - SW consult for cessation before discharge  Dispo: Disposition is deferred at this time, awaiting improvement of current medical problems.  Anticipated discharge in approximately 3 day(s).   The patient does not have current PCP (No primary provider on file.), therefore will be require follow-up after discharge.   The patient does not have transportation limitations that hinder transportation to clinic appointments.  .Services Needed at time of discharge: Y = Yes, Blank = No PT:   OT:   RN:   Equipment:   Other:     LOS: 3 days   Abubakar Crispo,  Azyriah Nevins 03/05/2012, 3:48 PM

## 2012-03-06 DIAGNOSIS — N186 End stage renal disease: Secondary | ICD-10-CM

## 2012-03-06 LAB — HEMOGLOBIN AND HEMATOCRIT, BLOOD
HCT: 23.7 % — ABNORMAL LOW (ref 39.0–52.0)
Hemoglobin: 8 g/dL — ABNORMAL LOW (ref 13.0–17.0)

## 2012-03-06 LAB — KAPPA/LAMBDA LIGHT CHAINS
Kappa free light chain: 62.4 mg/dL — ABNORMAL HIGH (ref 0.33–1.94)
Kappa, lambda light chain ratio: 3.59 — ABNORMAL HIGH (ref 0.26–1.65)
Lambda free light chains: 17.4 mg/dL — ABNORMAL HIGH (ref 0.57–2.63)

## 2012-03-06 MED ORDER — DEXTROSE 5 % IV SOLN
1.5000 g | INTRAVENOUS | Status: AC
  Administered 2012-03-07: 1.5 g via INTRAVENOUS
  Filled 2012-03-06: qty 1.5

## 2012-03-06 NOTE — Progress Notes (Addendum)
Subjective: Pt resting in bed. Hb has been stable (~8) over the past couple of days. [name] tentatively scheduled for BM biopsy on Tuesday for further evaluation of his anemia.  Denies CP, SOB, N/V, dizziness, abdominal pain, diarrhea.  Objective: Vital signs in last 24 hours: Filed Vitals:   03/05/12 1346 03/05/12 1730 03/05/12 2207 03/06/12 0635  BP: 162/75 153/54 167/70 150/62  Pulse: 67 77 65 67  Temp: 98.3 F (36.8 C) 98.7 F (37.1 C) 98.6 F (37 C) 98.7 F (37.1 C)  TempSrc: Oral Oral Oral Oral  Resp: 18 18 18 18   Height:      Weight:   147 lb 7.8 oz (66.9 kg)   SpO2: 98% 99% 96% 94%   Weight change: 7 lb 0.9 oz (3.2 kg)  Intake/Output Summary (Last 24 hours) at 03/06/12 0727 Last data filed at 03/06/12 0406  Gross per 24 hour  Intake    960 ml  Output    175 ml  Net    785 ml   Vitals reviewed. General: sitting in bed, eating breakfast HEENT: absent dentition, dental caries, PERRL, EOMI, no scleral icterus Cardiac: RRR, no rubs, murmurs or gallops. No JVD Pulm: clear to auscultation bilaterally, no wheezes, rales, or rhonchi Abd: soft, nontender, nondistended, BS present Ext: dry,warm and well perfused, no pedal edema. Arms with full ROM and no edema. No erythema/edema/warmth/tenderness over R access site.  Neuro: alert and oriented X3, cranial nerves II-XII grossly intact, strength and sensation to light touch equal in bilateral upper and lower extremities  Lab Results: Basic Metabolic Panel:  Lab 03/04/12 1610 03/03/12 0726 03/02/12 1145  NA 136 137 --  K 4.1 3.7 --  CL 97 98 --  CO2 27 28 --  GLUCOSE 125* 105* --  BUN 78* 56* --  CREATININE 7.88* 6.14* --  CALCIUM 7.6* 7.8* --  MG -- -- 2.0  PHOS 5.3* 5.5* --   Liver Function Tests:  Lab 03/04/12 0500 03/03/12 0726 03/02/12 0950  AST -- -- 16  ALT -- -- 12  ALKPHOS -- -- 38*  BILITOT -- -- 0.2*  PROT -- -- 6.3  ALBUMIN 2.4* 2.2* --   CBC:  Lab 03/06/12 0015 03/05/12 1845 03/04/12 0500  03/03/12 0726 03/02/12 0726  WBC -- -- 4.9 4.3 --  NEUTROABS -- -- -- -- 6.3  HGB 8.0* 7.8* -- -- --  HCT 23.7* 23.1* -- -- --  MCV -- -- 84.3 84.2 --  PLT -- -- 135* 142* --   Cardiac Enzymes:  Lab 03/03/12 0726 03/02/12 2339  CKTOTAL -- --  CKMB -- --  CKMBINDEX -- --  TROPONINI <0.30 <0.30   CBG:  Lab 03/03/12 0748 03/02/12 1910 03/02/12 1328  GLUCAP 104* 84 82   Fasting Lipid Panel:  Lab 03/02/12 1059  CHOL 113  HDL 38*  LDLCALC 62  TRIG 65  CHOLHDL 3.0  LDLDIRECT --   Coagulation:  Lab 03/02/12 0950  LABPROT 14.5  INR 1.15   Anemia Panel:  Lab 03/02/12 0950  VITAMINB12 532  FOLATE 7.3  FERRITIN 287  TIBC 212*  IRON 23*  RETICCTPCT 0.7   Urine Drug Screen: Drugs of Abuse     Component Value Date/Time   LABOPIA NONE DETECTED 03/02/2012 0728   COCAINSCRNUR POSITIVE* 03/02/2012 0728   LABBENZ NONE DETECTED 03/02/2012 0728   AMPHETMU NONE DETECTED 03/02/2012 0728   THCU NONE DETECTED 03/02/2012 0728   LABBARB NONE DETECTED 03/02/2012 0728    Urinalysis:  Lab 03/02/12 0728  COLORURINE YELLOW  LABSPEC 1.016  PHURINE 7.5  GLUCOSEU 250*  HGBUR SMALL*  BILIRUBINUR NEGATIVE  KETONESUR NEGATIVE  PROTEINUR >300*  UROBILINOGEN 0.2  NITRITE NEGATIVE  LEUKOCYTESUR NEGATIVE    Micro Results: Recent Results (from the past 240 hour(s))  CULTURE, BLOOD (ROUTINE X 2)     Status: Normal (Preliminary result)   Collection Time   03/02/12  9:32 AM      Component Value Range Status Comment   Specimen Description BLOOD RIGHT HAND   Final    Special Requests BOTTLES DRAWN AEROBIC AND ANAEROBIC 10CC   Final    Culture  Setup Time 03/02/2012 16:13   Final    Culture     Final    Value:        BLOOD CULTURE RECEIVED NO GROWTH TO DATE CULTURE WILL BE HELD FOR 5 DAYS BEFORE ISSUING A FINAL NEGATIVE REPORT   Report Status PENDING   Incomplete   CULTURE, BLOOD (ROUTINE X 2)     Status: Normal (Preliminary result)   Collection Time   03/02/12  9:49 AM       Component Value Range Status Comment   Specimen Description BLOOD RIGHT HAND   Final    Special Requests BOTTLES DRAWN AEROBIC AND ANAEROBIC 10CC   Final    Culture  Setup Time 03/02/2012 16:13   Final    Culture     Final    Value:        BLOOD CULTURE RECEIVED NO GROWTH TO DATE CULTURE WILL BE HELD FOR 5 DAYS BEFORE ISSUING A FINAL NEGATIVE REPORT   Report Status PENDING   Incomplete    Studies/Results: No results found. Medications: I have reviewed the patient's current medications. Scheduled Meds:    . benazepril  10 mg Oral Daily  . calcium carbonate  400 mg of elemental calcium Oral BID WC  . cholecalciferol  1,000 Units Oral Daily  . darbepoetin (ARANESP) injection - DIALYSIS  200 mcg Intravenous Q Thu-HD  . ferric gluconate (FERRLECIT/NULECIT) IV  125 mg Intravenous Q T,Th,Sa-HD  . folic acid  1 mg Oral Daily  . hydrALAZINE  50 mg Oral BID  . [COMPLETED] HYDROcodone-acetaminophen  1-2 tablet Oral Once  . levETIRAcetam  1,000 mg Oral BID  . metoprolol tartrate  50 mg Oral BID  . multivitamin with minerals  1 tablet Oral Daily  . pantoprazole  40 mg Oral QHS  . sodium chloride  3 mL Intravenous Q12H  . thiamine  100 mg Oral Daily   Or  . thiamine  100 mg Intravenous Daily  . [DISCONTINUED] hydrALAZINE  50 mg Oral TID   Continuous Infusions:  PRN Meds:.[EXPIRED] LORazepam, [EXPIRED] LORazepam, meclizine, traMADol  Assessment/Plan: Pt is a 56 y.o. yo male with a PMHx of ESRD on HD (TTS in [place], [place]), HTN, DM2 who was admitted on 03/02/2012 with symptoms of progressively worsening shortness of breath, which is likely secondary to noncompliance with dialysis schedule. His symptoms have resolved with dialysis  1) Shortness of breath - Likely secondary to volume overload in setting of noncompliance with dialysis schedule, as also confirmed by CXR showing vascular congestion. Anemia likely also contributing, Hb 5.3 on day of admission. EF 60-65%. SOB resolved with  dialysis -renal following, continue HD  2) ESRD on HD (TTS in [place] at [facility])  ESRD possibly secondary to renal dysfunction caused by diabetes, uncontrolled HTN, cocaine abuse. Pt non-compliant w outpt dialysis in [place], appears  he received most of his dialysis through [facility] when he presented to ED for volume overload   - left brachiocephalic AVF by vasc surg on Tuesday - TTS HD  3) Acute normocytic anemia  Prior records indicate frequent admissions for normocytic anemia requiring multiple transfusions in the past. No clear etiology, FOBT + but no source on EGD, capsule, or colonoscopy performed in [place], as recent as December 2012 (full records in shadow chart). Does have h/o H pylori. No evidence of acute blood loss. Elevated LDH but normal haptoglobin.  Low retic ct. High ferritin and TIBC. Direct antibody negative. Suspect BM process in light of low retic count and no evidence of active hemolysis. - BM biopsy Tuesday - heme following, appreciate recs - SPEP and light chains pending  4) Hyperkalemia  Resolved. Likely secondary to ongoing ARB use and missing his dialysis session.  - Follow potassium - dialysis as per above  5) Diabetes mellitus, type 2 - Non-insulin dependent, diet controlled at baseline. Will not [name] A1c at present given likely inaccuracy in setting of acute anemia.  - CBG monitoring and SSI  6) Hypertension  BP elevated, likely in setting of volume overload. Home medications include metoprolol, Hydralazine, Losartan. Expect improvement after HD.  - Hydralazine 50 BID, Metoprolol 50 BID, Benazepril 10 qd  7) Tobacco abuse -  Refuses nicotine patch inpatient. Ongoing 1 ppd.  - SW consult for cessation counseling - after acute and more serious issues addressed (ordered already placed).   8) Cocaine abuse -  UDS (+), admits to smoking crack cocaine, but will not discuss specifics.  - SW consult for cessation  counseling - after acute and more serious issues addressed (ordered already placed).    Dispo: Disposition is deferred at this time, awaiting improvement of current medical problems.  Anticipated discharge in approximately 3 day(s).   The patient does not have current PCP (No primary provider on file.), therefore will be require OPC follow-up after discharge.   The patient does not have transportation limitations that hinder transportation to clinic appointments.  .Services Needed at time of discharge: Y = Yes, Blank = No PT:   OT:   RN:   Equipment:   Other:     LOS: 4 days   Bronson Curb 03/06/2012, 7:27 AM  ----------------------------------------------------------------------------------------------------- Internal Medicine Teaching Service Attending Note Date: 03/06/2012  Patient name: [name]  Medical record number: [id number]  Date of birth: March 16, 1956   I saw the patient today and reviewed the plan of care with my resident team. I agree with the note above. Most important issues now remain regarding discharge and arrangement of his outpatient services which include PCP and dialysis.  Thanks Aletta Edouard 03/06/2012, 1:26 PM

## 2012-03-06 NOTE — Progress Notes (Signed)
Patient ID: [name], male   DOB: 1955/11/14, 56 y.o.   MRN: [id number]   [facility] Progress Note    Subjective:   Reports to be feeling better and wants to rest   Objective:   BP 176/59  Pulse 66  Temp 98.1 F (36.7 C) (Oral)  Resp 18  Ht 5\' 9"  (1.753 m)  Wt 66.9 kg (147 lb 7.8 oz)  BMI 21.78 kg/m2  SpO2 99%  Physical Exam: Gen: comfortably resting in bed- head covered with bedsheet CVS: Pulse RRR, S1 and S2 with ESM Resp:CTA bilaterally, no rales/rhonchi WUJ:WJXB, flat, NT, BS normal Ext:No LE edema  Labs: BMET  Lab 03/04/12 0500 03/03/12 0726 03/02/12 1407 03/02/12 0951 03/02/12 0726  NA 136 137 -- 143 142  K 4.1 3.7 6.5* 7.0* 6.5*  CL 97 98 -- 104 102  CO2 27 28 -- 18* 18*  GLUCOSE 125* 105* -- 80 86  BUN 78* 56* -- 135* 135*  CREATININE 7.88* 6.14* -- 13.66* 13.55*  ALB -- -- -- -- --  CALCIUM 7.6* 7.8* -- 7.4* 7.6*  PHOS 5.3* 5.5* -- 7.3* --   CBC  Lab 03/06/12 0015 03/05/12 1845 03/05/12 1237 03/05/12 0510 03/04/12 0500 03/03/12 0726 03/02/12 1145 03/02/12 0726  WBC -- -- -- -- 4.9 4.3 8.7 8.4  NEUTROABS -- -- -- -- -- -- -- 6.3  HGB 8.0* 7.8* 8.3* 7.9* -- -- -- --  HCT 23.7* 23.1* 24.5* 23.0* -- -- -- --  MCV -- -- -- -- 84.3 84.2 83.4 85.0  PLT -- -- -- -- 135* 142* 154 165     Medications:      . benazepril  10 mg Oral Daily  . calcium carbonate  400 mg of elemental calcium Oral BID WC  . cefUROXime (ZINACEF)  IV  1.5 g Intravenous On Call to OR  . cholecalciferol  1,000 Units Oral Daily  . darbepoetin (ARANESP) injection - DIALYSIS  200 mcg Intravenous Q Thu-HD  . ferric gluconate (FERRLECIT/NULECIT) IV  125 mg Intravenous Q T,Th,Sa-HD  . folic acid  1 mg Oral Daily  . hydrALAZINE  50 mg Oral BID  . [COMPLETED] HYDROcodone-acetaminophen  1-2 tablet Oral Once  . levETIRAcetam  1,000 mg Oral BID  . metoprolol tartrate  50 mg Oral BID  . multivitamin with minerals  1 tablet Oral Daily  . pantoprazole  40 mg Oral QHS  .  sodium chloride  3 mL Intravenous Q12H  . thiamine  100 mg Oral Daily   Or  . thiamine  100 mg Intravenous Daily     Assessment/ Plan:   1. History of GI bleed - evaluated at [facility] in 11/2011 for hematemesis with heme + stool; EGD was to be done as outpatient but, the patient failed to follow up- unclear if planned by GI to have EGD in this hospital stay. History of Hepatitis C--no plans for treatment now 2.ESRD: Plans for HD tomorrow, permanent access to also be placed tomorrow (this will facilitate his acceptance into a local HD unit- [facility])- social situation (living) needs to be verified prior to DC 3. 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 Nutrition: On renal diet, will f/u albumin and decide on need for ONS 6. Hypertension: Fair BP control, monitor post-HD   Zetta Bills, MD 03/06/2012, 10:28 AM

## 2012-03-06 NOTE — Progress Notes (Signed)
BM biopsy has been set up with the lab and is scheduled at 10 am on Tuesday 11/19 under CT guidance.

## 2012-03-06 NOTE — Progress Notes (Signed)
IP PROGRESS NOTE  Subjective:   He reports feeling better. No bleeding.  Objective: Vital signs in last 24 hours: Blood pressure 140/56, pulse 65, temperature 98.8 F (37.1 C), temperature source Oral, resp. rate 18, height 5\' 9"  (1.753 m), weight 147 lb 7.8 oz (66.9 kg), SpO2 100.00%.  Intake/Output from previous day: 11/17 0701 - 11/18 0700 In: 960 [P.O.:960] Out: 175 [Urine:175]  Physical Exam:  Not performed today  Lab Results:  Basename 03/06/12 0015 03/05/12 1845 03/04/12 0500  WBC -- -- 4.9  HGB 8.0* 7.8* --  HCT 23.7* 23.1* --  PLT -- -- 135*    BMET  Basename 03/04/12 0500  NA 136  K 4.1  CL 97  CO2 27  GLUCOSE 125*  BUN 78*  CREATININE 7.88*  CALCIUM 7.6*   DAT-negative Studies/Results: No results found.  Medications: I have reviewed the patient's current medications.  Assessment/Plan:  1. Severe anemia-status post red blood cell transfusions on 02/21/2012 and 03/04/2012  2. End-stage renal disease on hemodialysis  3. History of substance abuse  4. 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 Hypertension  6. Diabetes  7. History of hepatitis C and? Hepatitis B  8. Stool Hemoccult positive  9. Mild elevation of the LDH   The hemoglobin has stabilized after red blood cell transfusions. The anemia is most likely multifactorial including a component of renal insufficiency, chronic disease, and GI blood loss. Review of additional records from [place] indicate a chronic history of anemia. He may have an underlying hemoglobinopathy or thalassemia variant. A bone marrow biopsy is scheduled for today.  Recommendations:  1. Proceed with diagnostic bone marrow biopsy 03/07/2012 2. Followup on the serum protein electrophoresis and immunofixation 3. Followup on hemoglobin electrophoresis 4. Continue iron/erythropoietin via hemodialysis  I will followup on the results of the above laboratory studies and the bone marrow biopsy. I will check on him  03/08/2012 if he remains in the hospital.   LOS: 4 days   [facility], Kacy Conely  03/06/2012, 5:07 PM

## 2012-03-06 NOTE — Progress Notes (Signed)
Occupational Therapy Treatment Patient Details Name: [name] MRN: [id number] DOB: [birth year] Today's Date: 03/06/2012 Time: 5784-6962 OT Time Calculation (min): 14 min  OT Assessment / Plan / Recommendation Comments on Treatment Session Making steady progress. Pt with increased stability during funcitonal mobility  with use of RW. Pt in agreement to use RW if needed. will continue. Ok for pt to ambulate with RW with staff.    Follow Up Recommendations  No OT follow up    Barriers to Discharge   none    Equipment Recommendations  None recommended by OT    Recommendations for Other Services  none  Frequency Min 2X/week   Plan Discharge plan remains appropriate    Precautions / Restrictions Precautions Precautions: Fall Precaution Comments: mild fall risk   Pertinent Vitals/Pain No c/o pain    ADL  Grooming: Modified independent Where Assessed - Grooming: Unsupported standing Upper Body Bathing: Modified independent Where Assessed - Upper Body Bathing: Unsupported sit to stand Toilet Transfer: Supervision/safety Toilet Transfer Method: Sit to Barista: Comfort height toilet Toileting - Clothing Manipulation and Hygiene: Modified independent Where Assessed - Glass blower/designer Manipulation and Hygiene: Standing Equipment Used: Gait belt;Rolling walker Transfers/Ambulation Related to ADLs: Pt ambulated @ room without a RW and demonstrated a sway several times. Pt practiced with RW and much safer. Discussed home saety @ RW level. Pt in agreement to using RW if needed. ADL Comments: Good participation today. Increasing independence with ADL. Endurance and decreased strength increase pt as fall risk. Educated pt on E conservation techniques.    OT Diagnosis:    OT Problem List:   OT Treatment Interventions:     OT Goals Acute Rehab OT Goals OT Goal Formulation: With patient Time For Goal Achievement: 03/12/12 Potential to Achieve Goals:  Good ADL Goals Pt Will Perform Grooming: Independently;Standing at sink ADL Goal: Grooming - Progress: Progressing toward goals Pt Will Perform Upper Body Dressing: Independently;Sitting, chair;Sitting, bed ADL Goal: Upper Body Dressing - Progress: Progressing toward goals Pt Will Perform Lower Body Dressing: Independently;Sit to stand from chair;Sit to stand from bed ADL Goal: Lower Body Dressing - Progress: Progressing toward goals Pt Will Transfer to Toilet: Independently;Ambulation;Comfort height toilet ADL Goal: Toilet Transfer - Progress: Progressing toward goals Miscellaneous OT Goals Miscellaneous OT Goal #1: Pt will independently verbalize and incorporate 2 energy conservation techniques into ADL activities. OT Goal: Miscellaneous Goal #1 - Progress: Progressing toward goals  Visit Information  Last OT Received On: 03/06/12 Assistance Needed: +1    Subjective Data      Prior Functioning       Cognition  Overall Cognitive Status: Appears within functional limits for tasks assessed/performed Arousal/Alertness: Awake/alert Orientation Level: Appears intact for tasks assessed Behavior During Session: [facility] for tasks performed    Mobility  Shoulder Instructions Bed Mobility Bed Mobility: Supine to Sit Supine to Sit: 6: Modified independent (Device/Increase time) Sitting - Scoot to Edge of Bed: 6: Modified independent (Device/Increase time) Sit to Supine: 6: Modified independent (Device/Increase time) Details for Bed Mobility Assistance: increased time and effort Transfers Transfers: Sit to Stand;Stand to Sit Sit to Stand: 5: Supervision;With upper extremity assist;From bed Stand to Sit: 5: Supervision;With upper extremity assist;To bed Details for Transfer Assistance: vc for hand placement with use of RW       Exercises  General Exercises - Upper Extremity Shoulder Flexion: Both;Strengthening Shoulder Extension: Both;Strengthening Elbow Flexion:  Strengthening;Both Elbow Extension: Strengthening;Both   Balance  minA at times   End of  Session OT - End of Session Equipment Utilized During Treatment: Gait belt Activity Tolerance: Patient tolerated treatment well Patient left: in bed;with call bell/phone within reach Nurse Communication: Mobility status  GO     Katalaya Beel,HILLARY 03/06/2012, 4:37 PM [facility], OTR/L  [phone] 03/06/2012

## 2012-03-06 NOTE — Progress Notes (Signed)
Physical Therapy Treatment Patient Details Name: [name] MRN: [id number] DOB: [birth year] Today's Date: 03/06/2012 Time: 8469-6295 PT Time Calculation (min): 19 min  PT Assessment / Plan / Recommendation Comments on Treatment Session  Admitted with shortness of breath, ESRD, anemia; Good progress with ambulation distance and activity tolerance, though noted occasional use of hallway rail -- may consider use of cane next session    Follow Up Recommendations  No PT follow up;Supervision - Intermittent;Supervision for mobility/OOB     Does the patient have the potential to tolerate intense rehabilitation     Barriers to Discharge        Equipment Recommendations  Other (comment) (possibly straight cane; though pt will likely decline)    Recommendations for Other Services    Frequency Min 3X/week   Plan Discharge plan remains appropriate    Precautions / Restrictions Precautions Precautions: Fall Precaution Comments: mild fall risk   Pertinent Vitals/Pain no apparent distress     Mobility  Bed Mobility Bed Mobility: Supine to Sit Supine to Sit: 5: Supervision Details for Bed Mobility Assistance: increased time and effort Transfers Transfers: Sit to Stand;Stand to Sit Sit to Stand: 5: Supervision Stand to Sit: 5: Supervision;To bed Details for Transfer Assistance: required increased time Ambulation/Gait Ambulation/Gait Assistance: 4: Min guard (without physical contact) Ambulation Distance (Feet): 200 Feet (greater than) Assistive device: None Ambulation/Gait Assistance Details: continued mildly unsteady gait, though no overt loss of balance; Occasionally using hallway rail for unilateral UE support; Discussed possibility of using a cane Gait Pattern: Step-through pattern    Exercises     PT Diagnosis:    PT Problem List:   PT Treatment Interventions:     PT Goals Acute Rehab PT Goals Time For Goal Achievement: 03/11/12 Potential to Achieve Goals: Good Pt  will go Sit to Stand: Independently PT Goal: Sit to Stand - Progress: Progressing toward goal Pt will Ambulate: >150 feet;Independently PT Goal: Ambulate - Progress: Progressing toward goal PT Goal: Up/Down Stairs - Progress:  (declined stairs today)  Visit Information  Last PT Received On: 03/06/12 Assistance Needed: +1    Subjective Data  Subjective: What do you  have to do with me? Patient Stated Goal: Just get out of here; Pt also clearly wanting a cigarette   Cognition  Overall Cognitive Status: Appears within functional limits for tasks assessed/performed Arousal/Alertness: Awake/alert Orientation Level: Appears intact for tasks assessed Behavior During Session: [facility] for tasks performed    Balance     End of Session PT - End of Session Activity Tolerance: Patient tolerated treatment well Patient left: with bed alarm set;with call bell/phone within reach (sitting EOB) Nurse Communication: Mobility status   GP     Van Clines [facility] [place], [place] [phone]  03/06/2012, 4:17 PM

## 2012-03-07 ENCOUNTER — Inpatient Hospital Stay (HOSPITAL_COMMUNITY): Payer: Medicare Other | Admitting: Anesthesiology

## 2012-03-07 ENCOUNTER — Encounter (HOSPITAL_COMMUNITY)
Admission: EM | Disposition: A | Discharge status: Home / Self Care | Payer: Self-pay | Source: Home / Self Care | Attending: Internal Medicine

## 2012-03-07 ENCOUNTER — Encounter (HOSPITAL_COMMUNITY): Payer: Self-pay | Admitting: Anesthesiology

## 2012-03-07 HISTORY — PX: BASCILIC VEIN TRANSPOSITION: SHX5742

## 2012-03-07 LAB — UIFE/LIGHT CHAINS/TP QN, 24-HR UR
Albumin, U: DETECTED
Alpha 1, Urine: DETECTED — AB
Alpha 2, Urine: DETECTED — AB
Free Kappa/Lambda Ratio: 4.01 ratio (ref 2.04–10.37)
Total Protein, Urine: 144.2 mg/dL

## 2012-03-07 LAB — BASIC METABOLIC PANEL WITH GFR
BUN: 65 mg/dL — ABNORMAL HIGH (ref 6–23)
CO2: 23 meq/L (ref 19–32)
Calcium: 7.8 mg/dL — ABNORMAL LOW (ref 8.4–10.5)
Chloride: 94 meq/L — ABNORMAL LOW (ref 96–112)
Creatinine, Ser: 8.98 mg/dL — ABNORMAL HIGH (ref 0.50–1.35)
GFR calc Af Amer: 7 mL/min — ABNORMAL LOW
GFR calc non Af Amer: 6 mL/min — ABNORMAL LOW
Glucose, Bld: 127 mg/dL — ABNORMAL HIGH (ref 70–99)
Potassium: 4.5 meq/L (ref 3.5–5.1)
Sodium: 129 meq/L — ABNORMAL LOW (ref 135–145)

## 2012-03-07 LAB — VITAMIN D 1,25 DIHYDROXY: Vitamin D3 1, 25 (OH)2: <8 pg/mL

## 2012-03-07 LAB — HEMOGLOBINOPATHY EVALUATION
Hemoglobin Other: 0 %
Hgb A2 Quant: 2.6 % (ref 2.2–3.2)
Hgb A: 97.4 % (ref 96.8–97.8)
Hgb F Quant: 0 % (ref 0.0–2.0)
Hgb S Quant: 0 %

## 2012-03-07 LAB — CBC
Platelets: 206 10*3/uL (ref 150–400)
RBC: 2.94 MIL/uL — ABNORMAL LOW (ref 4.22–5.81)
RDW: 14.4 % (ref 11.5–15.5)
WBC: 9.1 10*3/uL (ref 4.0–10.5)

## 2012-03-07 LAB — PROTEIN ELECTROPHORESIS, SERUM
Alpha-2-Globulin: 11.2 % (ref 7.1–11.8)
Gamma Globulin: 21 % — ABNORMAL HIGH (ref 11.1–18.8)
M-Spike, %: NOT DETECTED g/dL

## 2012-03-07 LAB — POCT I-STAT 4, (NA,K, GLUC, HGB,HCT)
Glucose, Bld: 90 mg/dL (ref 70–99)
HCT: 23 % — ABNORMAL LOW (ref 39.0–52.0)
Potassium: 4.5 mEq/L (ref 3.5–5.1)
Sodium: 133 mEq/L — ABNORMAL LOW (ref 135–145)

## 2012-03-07 SURGERY — TRANSPOSITION, VEIN, BASILIC
Anesthesia: General | Site: Arm Lower | Laterality: Left | Wound class: Clean

## 2012-03-07 MED ORDER — LIDOCAINE HCL (CARDIAC) 20 MG/ML IV SOLN
INTRAVENOUS | Status: DC | PRN
  Administered 2012-03-07: 100 mg via INTRAVENOUS

## 2012-03-07 MED ORDER — THROMBIN 20000 UNITS EX SOLR
CUTANEOUS | Status: DC | PRN
  Administered 2012-03-07: 09:00:00 via TOPICAL

## 2012-03-07 MED ORDER — ALTEPLASE 2 MG IJ SOLR
2.0000 mg | Freq: Once | INTRAMUSCULAR | Status: AC | PRN
  Filled 2012-03-07: qty 2

## 2012-03-07 MED ORDER — PENTAFLUOROPROP-TETRAFLUOROETH EX AERO: 1.0000 "application " | INHALATION_SPRAY | CUTANEOUS | Status: DC | PRN

## 2012-03-07 MED ORDER — LIDOCAINE HCL (PF) 1 % IJ SOLN: 5.0000 mL | INTRAMUSCULAR | Status: DC | PRN

## 2012-03-07 MED ORDER — OXYCODONE HCL 5 MG PO TABS
5.0000 mg | ORAL_TABLET | Freq: Four times a day (QID) | ORAL | Status: DC | PRN
  Administered 2012-03-07 – 2012-03-08 (×3): 5 mg via ORAL
  Filled 2012-03-07 (×3): qty 1

## 2012-03-07 MED ORDER — HEPARIN SODIUM (PORCINE) 1000 UNIT/ML DIALYSIS
1000.0000 [IU] | INTRAMUSCULAR | Status: DC | PRN
  Filled 2012-03-07: qty 1

## 2012-03-07 MED ORDER — LIDOCAINE-EPINEPHRINE (PF) 1 %-1:200000 IJ SOLN
INTRAMUSCULAR | Status: DC | PRN
  Administered 2012-03-07: 30 mL

## 2012-03-07 MED ORDER — ONDANSETRON HCL 4 MG/2ML IJ SOLN
INTRAMUSCULAR | Status: DC | PRN
  Administered 2012-03-07: 4 mg via INTRAVENOUS

## 2012-03-07 MED ORDER — DEXTROSE 5 % IV SOLN
INTRAVENOUS | Status: DC | PRN
  Administered 2012-03-07: 08:00:00 via INTRAVENOUS

## 2012-03-07 MED ORDER — FENTANYL CITRATE 0.05 MG/ML IJ SOLN
INTRAMUSCULAR | Status: DC | PRN
  Administered 2012-03-07 (×2): 100 ug via INTRAVENOUS
  Administered 2012-03-07 (×2): 50 ug via INTRAVENOUS

## 2012-03-07 MED ORDER — SODIUM CHLORIDE 0.9 % IV SOLN: 100.0000 mL | INTRAVENOUS | Status: DC | PRN

## 2012-03-07 MED ORDER — BUPIVACAINE HCL (PF) 0.5 % IJ SOLN
INTRAMUSCULAR | Status: DC | PRN
  Administered 2012-03-07: 30 mL

## 2012-03-07 MED ORDER — PROPOFOL 10 MG/ML IV BOLUS
INTRAVENOUS | Status: DC | PRN
  Administered 2012-03-07: 150 mg via INTRAVENOUS

## 2012-03-07 MED ORDER — NEPRO/CARBSTEADY PO LIQD: 237.0000 mL | ORAL | Status: DC | PRN

## 2012-03-07 MED ORDER — EPHEDRINE SULFATE 50 MG/ML IJ SOLN
INTRAMUSCULAR | Status: DC | PRN
  Administered 2012-03-07: 10 mg via INTRAVENOUS

## 2012-03-07 MED ORDER — HEPARIN SODIUM (PORCINE) 1000 UNIT/ML DIALYSIS
1500.0000 [IU] | INTRAMUSCULAR | Status: DC | PRN
  Filled 2012-03-07: qty 2

## 2012-03-07 MED ORDER — ONDANSETRON HCL 4 MG/2ML IJ SOLN: 4.0000 mg | Freq: Once | INTRAMUSCULAR | Status: DC | PRN

## 2012-03-07 MED ORDER — HYDROMORPHONE HCL PF 1 MG/ML IJ SOLN: 0.2500 mg | INTRAMUSCULAR | Status: DC | PRN

## 2012-03-07 MED ORDER — LIDOCAINE-PRILOCAINE 2.5-2.5 % EX CREA: 1.0000 "application " | TOPICAL_CREAM | CUTANEOUS | Status: DC | PRN

## 2012-03-07 MED ORDER — LIDOCAINE-EPINEPHRINE (PF) 1 %-1:200000 IJ SOLN
INTRAMUSCULAR | Status: AC
  Filled 2012-03-07: qty 10

## 2012-03-07 MED ORDER — BUPIVACAINE HCL (PF) 0.5 % IJ SOLN
INTRAMUSCULAR | Status: AC
  Filled 2012-03-07: qty 30

## 2012-03-07 MED ORDER — HYDROMORPHONE HCL PF 1 MG/ML IJ SOLN
INTRAMUSCULAR | Status: AC
  Filled 2012-03-07: qty 1

## 2012-03-07 MED ORDER — SODIUM CHLORIDE 0.9 % IR SOLN
Status: DC | PRN
  Administered 2012-03-07: 08:00:00

## 2012-03-07 MED ORDER — DEXAMETHASONE SODIUM PHOSPHATE 4 MG/ML IJ SOLN
INTRAMUSCULAR | Status: DC | PRN
  Administered 2012-03-07: 8 mg via INTRAVENOUS

## 2012-03-07 MED ORDER — THROMBIN 20000 UNITS EX SOLR
CUTANEOUS | Status: AC
  Filled 2012-03-07: qty 20000

## 2012-03-07 MED ORDER — SODIUM CHLORIDE 0.9 % IV SOLN
INTRAVENOUS | Status: DC | PRN
  Administered 2012-03-07 (×2): via INTRAVENOUS

## 2012-03-07 MED ORDER — 0.9 % SODIUM CHLORIDE (POUR BTL) OPTIME
TOPICAL | Status: DC | PRN
  Administered 2012-03-07: 1000 mL

## 2012-03-07 MED ORDER — ARTIFICIAL TEARS OP OINT
TOPICAL_OINTMENT | OPHTHALMIC | Status: DC | PRN
  Administered 2012-03-07: 1 via OPHTHALMIC

## 2012-03-07 SURGICAL SUPPLY — 41 items
CANISTER SUCTION 2500CC (MISCELLANEOUS) ×2 IMPLANT
CLIP TI MEDIUM 24 (CLIP) ×2 IMPLANT
CLIP TI WIDE RED SMALL 24 (CLIP) ×2 IMPLANT
CLOTH BEACON ORANGE TIMEOUT ST (SAFETY) ×2 IMPLANT
CORDS BIPOLAR (ELECTRODE) IMPLANT
COVER PROBE W GEL 5X96 (DRAPES) ×2 IMPLANT
COVER SURGICAL LIGHT HANDLE (MISCELLANEOUS) ×2 IMPLANT
DECANTER SPIKE VIAL GLASS SM (MISCELLANEOUS) ×2 IMPLANT
DERMABOND ADVANCED (GAUZE/BANDAGES/DRESSINGS) ×1
DERMABOND ADVANCED .7 DNX12 (GAUZE/BANDAGES/DRESSINGS) ×1 IMPLANT
DRAIN PENROSE 1/2X12 LTX STRL (WOUND CARE) IMPLANT
ELECT REM PT RETURN 9FT ADLT (ELECTROSURGICAL) ×2
ELECTRODE REM PT RTRN 9FT ADLT (ELECTROSURGICAL) ×1 IMPLANT
GLOVE BIO SURGEON STRL SZ 6.5 (GLOVE) ×4 IMPLANT
GLOVE BIO SURGEON STRL SZ7 (GLOVE) ×2 IMPLANT
GLOVE BIOGEL PI IND STRL 7.0 (GLOVE) ×1 IMPLANT
GLOVE BIOGEL PI IND STRL 7.5 (GLOVE) ×2 IMPLANT
GLOVE BIOGEL PI INDICATOR 7.0 (GLOVE) ×1
GLOVE BIOGEL PI INDICATOR 7.5 (GLOVE) ×2
GLOVE ECLIPSE 6.5 STRL STRAW (GLOVE) ×2 IMPLANT
GLOVE SURG SS PI 7.0 STRL IVOR (GLOVE) ×2 IMPLANT
GLOVE SURG SS PI 7.5 STRL IVOR (GLOVE) ×4 IMPLANT
GOWN PREVENTION PLUS XLARGE (GOWN DISPOSABLE) ×4 IMPLANT
GOWN PREVENTION PLUS XXLARGE (GOWN DISPOSABLE) ×2 IMPLANT
GOWN STRL NON-REIN LRG LVL3 (GOWN DISPOSABLE) ×2 IMPLANT
KIT BASIN OR (CUSTOM PROCEDURE TRAY) ×2 IMPLANT
KIT ROOM TURNOVER OR (KITS) ×2 IMPLANT
NS IRRIG 1000ML POUR BTL (IV SOLUTION) ×2 IMPLANT
PACK CV ACCESS (CUSTOM PROCEDURE TRAY) ×2 IMPLANT
PAD ARMBOARD 7.5X6 YLW CONV (MISCELLANEOUS) ×4 IMPLANT
SPONGE SURGIFOAM ABS GEL 100 (HEMOSTASIS) ×2 IMPLANT
SUT MNCRL AB 4-0 PS2 18 (SUTURE) ×2 IMPLANT
SUT PROLENE 6 0 BV (SUTURE) ×8 IMPLANT
SUT PROLENE 7 0 BV 1 (SUTURE) ×2 IMPLANT
SUT SILK 2 0 SH (SUTURE) ×2 IMPLANT
SUT VIC AB 3-0 SH 27 (SUTURE) ×2
SUT VIC AB 3-0 SH 27X BRD (SUTURE) ×2 IMPLANT
TOWEL OR 17X24 6PK STRL BLUE (TOWEL DISPOSABLE) ×2 IMPLANT
TOWEL OR 17X26 10 PK STRL BLUE (TOWEL DISPOSABLE) ×2 IMPLANT
UNDERPAD 30X30 INCONTINENT (UNDERPADS AND DIAPERS) ×2 IMPLANT
WATER STERILE IRR 1000ML POUR (IV SOLUTION) ×2 IMPLANT

## 2012-03-07 NOTE — Anesthesia Procedure Notes (Signed)
Procedure Name: LMA Insertion Date/Time: 03/07/2012 7:48 AM Performed by: Wray Kearns A Pre-anesthesia Checklist: Patient identified, Timeout performed, Emergency Drugs available, Suction available and Patient being monitored Patient Re-evaluated:Patient Re-evaluated prior to inductionOxygen Delivery Method: Circle system utilized Preoxygenation: Pre-oxygenation with 100% oxygen Intubation Type: IV induction Ventilation: Mask ventilation without difficulty LMA: LMA inserted LMA Size: 5.0 Tube type: Oral Number of attempts: 1 Placement Confirmation: ETT inserted through vocal cords under direct vision,  breath sounds checked- equal and bilateral and positive ETCO2 Tube secured with: Tape Dental Injury: Teeth and Oropharynx as per pre-operative assessment

## 2012-03-07 NOTE — Preoperative (Signed)
Beta Blockers   Reason not to administer Beta Blockers:Lopressor last taken on 03/01/12-Dr. Katrinka Blazing informed.

## 2012-03-07 NOTE — Progress Notes (Signed)
Utilization review completed.  

## 2012-03-07 NOTE — H&P (Signed)
  [facility]  History and Physical Update  The patient was interviewed and re-examined.  The patient's previous History and Physical has been reviewed and is unchanged from Dr. Estanislado Spire consult on: 03/03/12.  There is no change in the plan of care: Left arm arteriovenous fistula placement.Leonides Sake, MD [facility] [place] Office: [phone] Pager: [phone]  03/07/2012, 7:28 AM

## 2012-03-07 NOTE — Anesthesia Preprocedure Evaluation (Addendum)
Anesthesia Evaluation  Patient identified by MRN, date of birth, ID band Patient awake    Reviewed: Allergy & Precautions, H&P , NPO status , Patient's Chart, lab work & pertinent test results  Airway Mallampati: I TM Distance: >3 FB Neck ROM: full    Dental   Pulmonary Current Smoker,          Cardiovascular hypertension, +CHF Rhythm:regular Rate:Normal     Neuro/Psych    GI/Hepatic (+) Hepatitis -, C  Endo/Other  diabetes, Type 2, Oral Hypoglycemic Agents  Renal/GU ESRF, Dialysis and CRFRenal disease     Musculoskeletal   Abdominal   Peds  Hematology   Anesthesia Other Findings Drug abuse  Reproductive/Obstetrics                          Anesthesia Physical Anesthesia Plan  ASA: III  Anesthesia Plan: General   Post-op Pain Management:    Induction: Intravenous  Airway Management Planned: LMA and Oral ETT  Additional Equipment:   Intra-op Plan:   Post-operative Plan: Extubation in OR  Informed Consent: I have reviewed the patients History and Physical, chart, labs and discussed the procedure including the risks, benefits and alternatives for the proposed anesthesia with the patient or authorized representative who has indicated his/her understanding and acceptance.     Plan Discussed with: CRNA, Anesthesiologist and Surgeon  Anesthesia Plan Comments:         Anesthesia Quick Evaluation

## 2012-03-07 NOTE — Progress Notes (Signed)
Patient was scheduled with the lab for BM needle core biopsy today at 10.  Never received call that patient would be going to the OR and that the procedure would be postponed which has caused significant changes in the schedule affecting multiple modalities.  RN unable to provide any details. At this time, BM bx had to be cancelled.  Please reorder BM biopsy when appropriate so that the schedule is not negatively affected by procedures that are not intended to be performed.

## 2012-03-07 NOTE — Anesthesia Postprocedure Evaluation (Signed)
  Anesthesia Post-op Note  Patient: [name]  Procedure(s) Performed: Procedure(s) (LRB) with comments: BASCILIC VEIN TRANSPOSITION (Left) - First Stage  Patient Location: PACU  Anesthesia Type:General  Level of Consciousness: awake, alert , oriented and patient cooperative  Airway and Oxygen Therapy: Patient Spontanous Breathing  Post-op Pain: none  Post-op Assessment: Post-op Vital signs reviewed, Patient's Cardiovascular Status Stable, Respiratory Function Stable, Patent Airway, No signs of Nausea or vomiting and Pain level controlled  Post-op Vital Signs: stable  Complications: No apparent anesthesia complications

## 2012-03-07 NOTE — Progress Notes (Addendum)
Subjective: Pt resting in bed. S/p surgery for placement of L AVF this morning. No complaints. Hb has been stable (~8) over the past couple of days.  Denies CP, SOB, N/V, dizziness, abdominal pain, diarrhea.  Objective: Vital signs in last 24 hours: Filed Vitals:   03/07/12 1010 03/07/12 1011 03/07/12 1012 03/07/12 1013  BP:      Pulse: 67 69 69 69  Temp: 98 F (36.7 C)     TempSrc:      Resp: 21 21 15 21   Height:      Weight:      SpO2: 90% 92% 93% 91%   Weight change: 1 lb 5.2 oz (0.6 kg)  Intake/Output Summary (Last 24 hours) at 03/07/12 1221 Last data filed at 03/07/12 0900  Gross per 24 hour  Intake   1030 ml  Output      1 ml  Net   1029 ml   Vitals reviewed. General: sitting in bed, eating breakfast HEENT: absent dentition, dental caries, PERRL, EOMI, no scleral icterus Cardiac: RRR, no rubs, murmurs or gallops. No JVD Pulm: clear to auscultation bilaterally, no wheezes, rales, or rhonchi Abd: soft, nontender, nondistended, BS present Ext: Clean closed surgical incision over L antecubital area, bandaged with clear adhesive dressing. No erythema, warmth, induration or bleeding.  No erythema/edema/warmth/tenderness over R access site.  Neuro: alert and oriented X3, cranial nerves II-XII grossly intact, strength and sensation to light touch equal in bilateral upper and lower extremities  Lab Results: Basic Metabolic Panel:  Lab 03/07/12 1478 03/04/12 0500 03/03/12 0726 03/02/12 1145  NA 133* 136 -- --  K 4.5 4.1 -- --  CL -- 97 98 --  CO2 -- 27 28 --  GLUCOSE 90 125* -- --  BUN -- 78* 56* --  CREATININE -- 7.88* 6.14* --  CALCIUM -- 7.6* 7.8* --  MG -- -- -- 2.0  PHOS -- 5.3* 5.5* --   Liver Function Tests:  Lab 03/04/12 0500 03/03/12 0726 03/02/12 0950  AST -- -- 16  ALT -- -- 12  ALKPHOS -- -- 38*  BILITOT -- -- 0.2*  PROT -- -- 6.3  ALBUMIN 2.4* 2.2* --   CBC:  Lab 03/07/12 0719 03/06/12 0015 03/04/12 0500 03/03/12 0726 03/02/12 0726  WBC -- --  4.9 4.3 --  NEUTROABS -- -- -- -- 6.3  HGB 7.8* 8.0* -- -- --  HCT 23.0* 23.7* -- -- --  MCV -- -- 84.3 84.2 --  PLT -- -- 135* 142* --   Cardiac Enzymes:  Lab 03/03/12 0726 03/02/12 2339  CKTOTAL -- --  CKMB -- --  CKMBINDEX -- --  TROPONINI <0.30 <0.30   CBG:  Lab 03/03/12 0748 03/02/12 1910 03/02/12 1328  GLUCAP 104* 84 82   Fasting Lipid Panel:  Lab 03/02/12 1059  CHOL 113  HDL 38*  LDLCALC 62  TRIG 65  CHOLHDL 3.0  LDLDIRECT --   Coagulation:  Lab 03/02/12 0950  LABPROT 14.5  INR 1.15   Anemia Panel:  Lab 03/02/12 0950  VITAMINB12 532  FOLATE 7.3  FERRITIN 287  TIBC 212*  IRON 23*  RETICCTPCT 0.7   Urine Drug Screen: Drugs of Abuse     Component Value Date/Time   LABOPIA NONE DETECTED 03/02/2012 0728   COCAINSCRNUR POSITIVE* 03/02/2012 0728   LABBENZ NONE DETECTED 03/02/2012 0728   AMPHETMU NONE DETECTED 03/02/2012 0728   THCU NONE DETECTED 03/02/2012 0728   LABBARB NONE DETECTED 03/02/2012 0728    Urinalysis:  Lab 03/02/12 0728  COLORURINE YELLOW  LABSPEC 1.016  PHURINE 7.5  GLUCOSEU 250*  HGBUR SMALL*  BILIRUBINUR NEGATIVE  KETONESUR NEGATIVE  PROTEINUR >300*  UROBILINOGEN 0.2  NITRITE NEGATIVE  LEUKOCYTESUR NEGATIVE    Micro Results: Recent Results (from the past 240 hour(s))  CULTURE, BLOOD (ROUTINE X 2)     Status: Normal (Preliminary result)   Collection Time   03/02/12  9:32 AM      Component Value Range Status Comment   Specimen Description BLOOD RIGHT HAND   Final    Special Requests BOTTLES DRAWN AEROBIC AND ANAEROBIC 10CC   Final    Culture  Setup Time 03/02/2012 16:13   Final    Culture     Final    Value:        BLOOD CULTURE RECEIVED NO GROWTH TO DATE CULTURE WILL BE HELD FOR 5 DAYS BEFORE ISSUING A FINAL NEGATIVE REPORT   Report Status PENDING   Incomplete   CULTURE, BLOOD (ROUTINE X 2)     Status: Normal (Preliminary result)   Collection Time   03/02/12  9:49 AM      Component Value Range Status Comment    Specimen Description BLOOD RIGHT HAND   Final    Special Requests BOTTLES DRAWN AEROBIC AND ANAEROBIC 10CC   Final    Culture  Setup Time 03/02/2012 16:13   Final    Culture     Final    Value:        BLOOD CULTURE RECEIVED NO GROWTH TO DATE CULTURE WILL BE HELD FOR 5 DAYS BEFORE ISSUING A FINAL NEGATIVE REPORT   Report Status PENDING   Incomplete    Studies/Results: No results found. Medications: I have reviewed the patient's current medications. Scheduled Meds:    . benazepril  10 mg Oral Daily  . calcium carbonate  400 mg of elemental calcium Oral BID WC  . [COMPLETED] cefUROXime (ZINACEF)  IV  1.5 g Intravenous On Call to OR  . cholecalciferol  1,000 Units Oral Daily  . darbepoetin (ARANESP) injection - DIALYSIS  200 mcg Intravenous Q Thu-HD  . ferric gluconate (FERRLECIT/NULECIT) IV  125 mg Intravenous Q T,Th,Sa-HD  . folic acid  1 mg Oral Daily  . hydrALAZINE  50 mg Oral BID  . levETIRAcetam  1,000 mg Oral BID  . metoprolol tartrate  50 mg Oral BID  . multivitamin with minerals  1 tablet Oral Daily  . pantoprazole  40 mg Oral QHS  . sodium chloride  3 mL Intravenous Q12H  . thiamine  100 mg Oral Daily   Or  . thiamine  100 mg Intravenous Daily   Continuous Infusions:  PRN Meds:.sodium chloride, sodium chloride, alteplase, feeding supplement (NEPRO CARB STEADY), heparin, heparin, lidocaine, lidocaine-prilocaine, meclizine, oxyCODONE, pentafluoroprop-tetrafluoroeth, traMADol, [DISCONTINUED] 0.9 % irrigation (POUR BTL), [DISCONTINUED] bupivacaine, [DISCONTINUED] heparin 6000 unit irrigation, [DISCONTINUED]  HYDROmorphone (DILAUDID) injection, [DISCONTINUED] lidocaine-EPINEPHrine, [DISCONTINUED] ondansetron (ZOFRAN) IV [DISCONTINUED] Surgifoam 100 with Thrombin 20,000 units (20 ml) topical solution  Assessment/Plan: Pt is a 56 y.o. yo male with a PMHx of ESRD on HD (TTS in [place], [place]), HTN, DM2 who was admitted on 03/02/2012 with symptoms of progressively worsening shortness of  breath, which is likely secondary to noncompliance with dialysis schedule. His symptoms have resolved with dialysis  1) Shortness of breath - Resolved. Likely secondary to volume overload in setting of noncompliance with dialysis schedule, as also confirmed by CXR showing vascular congestion. Anemia likely also contributing, Hb 5.3 on day of  admission. EF 60-65%. SOB resolved with dialysis. -renal following, continue HD  2) ESRD on HD (TTS in [place] at [facility] dialysis center)  ESRD possibly secondary to renal dysfunction caused by diabetes, uncontrolled HTN, cocaine abuse. Pt non-compliant w outpt dialysis in [place], appears he received most of his dialysis through [facility] when he presented to ED for volume overload . Pt had left brachiocephalic AVF by vasc surg today. - TTS HD - f/u oupt dialysis options  3) Acute normocytic anemia  Prior records indicate frequent admissions for normocytic anemia requiring multiple transfusions in the past. No clear etiology, FOBT + but no source on EGD, capsule, or colonoscopy performed in [place], as recent as December 2012 (full records in shadow chart). Does have h/o H pylori. No evidence of acute blood loss. Elevated LDH but normal haptoglobin.  Low retic ct. High ferritin and TIBC. Direct antibody negative. Suspect BM process in light of low retic count and no evidence of active hemolysis. - BM biopsy pending - heme following, appreciate recs - SPEP and light chains pending  4) Hyperkalemia  Resolved. Likely secondary to ongoing ARB use and missing his dialysis session.  - Follow potassium - dialysis as per above  5) Diabetes mellitus, type 2 - Non-insulin dependent, diet controlled at baseline. Will not check A1c at present given likely inaccuracy in setting of acute anemia.  - CBG monitoring and SSI  6) Hypertension  BP elevated, likely in setting of volume overload. Home medications include metoprolol, Hydralazine,  Losartan. Expect improvement after HD.  - Hydralazine 50 BID, Metoprolol 50 BID, Benazepril 10 qd  7) Tobacco abuse -  Refuses nicotine patch inpatient. Ongoing 1 ppd.  - SW consult for cessation counseling - after acute and more serious issues addressed (ordered already placed).   8) Cocaine abuse -  UDS (+), admits to smoking crack cocaine, but will not discuss specifics.  - SW consult for cessation counseling - after acute and more serious issues addressed (ordered already placed).    Dispo: Disposition is deferred at this time, awaiting improvement of current medical problems.  Anticipated discharge in approximately 3 day(s).   The patient does not have current PCP (No primary provider on file.), therefore will be require OPC follow-up after discharge.   The patient does not have transportation limitations that hinder transportation to clinic appointments.  .Services Needed at time of discharge: Y = Yes, Blank = No PT:   OT:   RN:   Equipment:   Other:     LOS: 5 days   Bronson Curb 03/07/2012, 12:21 PM  -------------------------------------------------------------------------------------------------------- Internal Medicine Teaching Service Attending Note Date: 03/07/2012  Patient name: [name]  Medical record number: [id number]  Date of birth: [birth year]   I saw the patient today and reviewed the plan of care with my resident team. I have reviewed his labs & vitals.    Lab 03/07/12 1140 03/07/12 0719 03/04/12 0500 03/03/12 0726 03/02/12 1145 03/02/12 0951 03/02/12 0726  NA 129* 133* 136 137 -- 143 --  K 4.5 4.5 -- -- -- -- --  CL 94* -- 97 98 -- 104 102  CO2 23 -- 27 28 -- 18* 18*  GLUCOSE 127* 90 125* 105* -- 80 --  BUN 65* -- 78* 56* -- 135* 135*  CREATININE 8.98* -- 7.88* 6.14* -- 13.66* 13.55*  CALCIUM 7.8* -- 7.6* 7.8* -- 7.4* 7.6*  MG -- -- -- -- 2.0 -- --  PHOS -- -- 5.3* 5.5* -- 7.3* --  Lab 03/04/12 0500 03/03/12 0726 03/02/12 0951  03/02/12 0950  AST -- -- -- 16  ALT -- -- -- 12  ALKPHOS -- -- -- 38*  BILITOT -- -- -- 0.2*  PROT -- -- -- 6.3  ALBUMIN 2.4* 2.2* 2.7* 2.7*  INR -- -- -- 1.15   I have reviewed Dr. Kathaleen Maser note and agree with it. Patient has undergone an AV fistula creation surgery today. He will undergo bone marrow biopsy tomorrow for work up of his anemia.     Thanks Aletta Edouard 03/07/2012, 6:03 PM

## 2012-03-07 NOTE — OR Nursing (Signed)
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 On call Infectious disease personnel Vernona Rieger) spoke with her manager regarding situation and  informed us that the patient was low risk for transmission and it was not necessary at this time to put patient on isolation. Infectious Disease also said they will be  following up with this patient.

## 2012-03-07 NOTE — Op Note (Signed)
OPERATIVE NOTE   PROCEDURE: 1. left first stage basilic vein transposition (brachiobasilic arteriovenous fistula) placement  PRE-OPERATIVE DIAGNOSIS: end stage renal disease   POST-OPERATIVE DIAGNOSIS: same as above   SURGEON: Leonides Sake, MD  ASSISTANT(S): Doreatha Massed, PAC   ANESTHESIA: general  ESTIMATED BLOOD LOSS: 30 cc  FINDING(S): 1. Sclerotic cephalic and cubital veins not suitable for fistula 2. Palpable thrill and radial pulse at end of case  SPECIMEN(S):  none  INDICATIONS:   [name] is a 56 y.o. male who presents with end stage renal disease.  The patient is scheduled for left arm fistula.  The patient is aware the risks include but are not limited to: bleeding, infection, steal syndrome, nerve damage, ischemic monomelic neuropathy, failure to mature, and need for additional procedures.  The patient is aware of the risks of the procedure and elects to proceed forward.  DESCRIPTION: After full informed written consent was obtained from the patient, the patient was brought back to the operating room and placed supine upon the operating table.  Prior to induction, the patient received IV antibiotics.   After obtaining adequate anesthesia, the patient was then prepped and draped in the standard fashion for a left arm access procedure.  I turned my attention first to identifying the patient's cephalic and basilic veins and brachial artery.  Using SonoSite guidance, the location of these vessels were marked out on the skin.   At this point, I injected local anesthetic to obtain a field block of the antecubitum.  In total, I injected about 10 mL of a 1:1 mixture of 0.5% Marcaine without epinephrine and 1% lidocaine with epinephrine.  I made a transverse incision at the level of the antecubitum and dissected through the subcutaneous tissue and fascia to gain exposure of the brachial artery.  This was noted to be 4 mm in diameter externally.  This was dissected out proximally  and distally and controlled with vessel loops .  I then dissected out the cephalic vein.  In the process, a cubital vein was found which became cord like proximally.  After dissecting out the cephalic vein, proximally it became also severely diseased at the level of antecubitum.   Essentially there was not enough length of the patent cephalic vein for use as a fistula.  I then turned my attention to dissecting out the basilic vein.  This was noted to be 5 mm in diameter externally.  The distal segment of the vein was ligated with a  2-0 silk, and the vein was transected.  The proximal segment was iinterrogated with serial dilators.  The vein accepted up to a 5 mm dilator with some resistance proximally.  I then instilled the heparinized saline into the vein and clamped it.  At this point, I reset my exposure of the brachial artery and placed the artery under tension proximally and distally.  I made an arteriotomy with a #11 blade, and then I extended the arteriotomy with a Potts scissor.  I injected heparinized saline proximal and distal to this arteriotomy.  The vein was then sewn to the artery in an end-to-side configuration with a running stitch of 6-0 Prolene.  Prior to completing this anastomosis, I allowed the vein and artery to backbleed.  There was no evidence of clot from any vessels.  I completed the anastomosis in the usual fashion and then released all vessel loops and clamps.  There was palpable  thrill in the venous outflow, and there was a palpable radial pulse.  At this point, I irrigated out the surgical wound.  There was no further active bleeding.  The subcutaneous tissue was reapproximated with a running stitch of 3-0 Vicryl.  The skin was then reapproximated with a running subcuticular stitch of 4-0 Vicryl.  The skin was then cleaned, dried, and reinforced with Dermabond.  The patient tolerated this procedure well.   COMPLICATIONS: none  CONDITION: stable  Leonides Sake, MD [facility] Office: [phone] Pager: [phone]  03/07/2012, 9:41 AM

## 2012-03-07 NOTE — Transfer of Care (Signed)
Immediate Anesthesia Transfer of Care Note  Patient: [name]  Procedure(s) Performed: Procedure(s) (LRB) with comments: BASCILIC VEIN TRANSPOSITION (Left) - First Stage  Patient Location: PACU  Anesthesia Type:General  Level of Consciousness: oriented, sedated, patient cooperative and responds to stimulation  Airway & Oxygen Therapy: Patient Spontanous Breathing and Patient connected to face mask oxygen  Post-op Assessment: Report given to PACU RN, Post -op Vital signs reviewed and stable, Patient moving all extremities and Patient moving all extremities X 4  Post vital signs: Reviewed and stable  Complications: No apparent anesthesia complications

## 2012-03-08 ENCOUNTER — Other Ambulatory Visit: Payer: Self-pay | Admitting: *Deleted

## 2012-03-08 ENCOUNTER — Telehealth: Payer: Self-pay | Admitting: Vascular Surgery

## 2012-03-08 DIAGNOSIS — Z4931 Encounter for adequacy testing for hemodialysis: Secondary | ICD-10-CM

## 2012-03-08 DIAGNOSIS — N186 End stage renal disease: Secondary | ICD-10-CM

## 2012-03-08 LAB — CBC
HCT: 23.1 % — ABNORMAL LOW (ref 39.0–52.0)
Hemoglobin: 7.7 g/dL — ABNORMAL LOW (ref 13.0–17.0)
WBC: 6.9 10*3/uL (ref 4.0–10.5)

## 2012-03-08 LAB — BASIC METABOLIC PANEL
BUN: 80 mg/dL — ABNORMAL HIGH (ref 6–23)
CO2: 24 mEq/L (ref 19–32)
Chloride: 95 mEq/L — ABNORMAL LOW (ref 96–112)
GFR calc Af Amer: 5 mL/min — ABNORMAL LOW (ref >90)
Glucose, Bld: 109 mg/dL — ABNORMAL HIGH (ref 70–99)
Potassium: 4.4 mEq/L (ref 3.5–5.1)

## 2012-03-08 LAB — CULTURE, BLOOD (ROUTINE X 2)
Culture: NO GROWTH
Culture: NO GROWTH

## 2012-03-08 MED ORDER — CALCIUM CARBONATE ANTACID 500 MG PO CHEW: 2.0000 | CHEWABLE_TABLET | Freq: Two times a day (BID) | ORAL | Status: DC

## 2012-03-08 MED ORDER — HYDRALAZINE HCL 50 MG PO TABS: 50.0000 mg | ORAL_TABLET | Freq: Two times a day (BID) | ORAL | Status: DC

## 2012-03-08 MED ORDER — SODIUM CHLORIDE 0.9 % IV SOLN: 100.0000 mL | INTRAVENOUS | Status: DC | PRN

## 2012-03-08 MED ORDER — LIDOCAINE HCL (PF) 1 % IJ SOLN: 5.0000 mL | INTRAMUSCULAR | Status: DC | PRN

## 2012-03-08 MED ORDER — PANTOPRAZOLE SODIUM 40 MG PO TBEC: 40.0000 mg | DELAYED_RELEASE_TABLET | Freq: Every day | ORAL | Status: DC

## 2012-03-08 MED ORDER — NEPRO/CARBSTEADY PO LIQD
237.0000 mL | ORAL | Status: DC | PRN
  Filled 2012-03-08: qty 237

## 2012-03-08 MED ORDER — PENTAFLUOROPROP-TETRAFLUOROETH EX AERO: 1.0000 "application " | INHALATION_SPRAY | CUTANEOUS | Status: DC | PRN

## 2012-03-08 MED ORDER — VITAMIN D3 25 MCG (1000 UNIT) PO TABS: 1000.0000 [IU] | ORAL_TABLET | Freq: Every day | ORAL | Status: DC

## 2012-03-08 MED ORDER — FOLIC ACID 1 MG PO TABS: 1.0000 mg | ORAL_TABLET | Freq: Every day | ORAL | Status: DC

## 2012-03-08 MED ORDER — LEVETIRACETAM 1000 MG PO TABS: 1000.0000 mg | ORAL_TABLET | Freq: Two times a day (BID) | ORAL | Status: DC

## 2012-03-08 MED ORDER — ALTEPLASE 2 MG IJ SOLR
2.0000 mg | Freq: Once | INTRAMUSCULAR | Status: DC | PRN
  Filled 2012-03-08: qty 2

## 2012-03-08 MED ORDER — HEPARIN SODIUM (PORCINE) 1000 UNIT/ML DIALYSIS: 1500.0000 [IU] | INTRAMUSCULAR | Status: DC | PRN

## 2012-03-08 MED ORDER — ADULT MULTIVITAMIN W/MINERALS CH: 1.0000 | ORAL_TABLET | Freq: Every day | ORAL | Status: DC

## 2012-03-08 MED ORDER — METOPROLOL TARTRATE 50 MG PO TABS: 50.0000 mg | ORAL_TABLET | Freq: Two times a day (BID) | ORAL | Status: DC

## 2012-03-08 MED ORDER — HYDRALAZINE HCL 100 MG PO TABS: 50.0000 mg | ORAL_TABLET | Freq: Two times a day (BID) | ORAL | Status: DC

## 2012-03-08 MED ORDER — HEPARIN SODIUM (PORCINE) 1000 UNIT/ML DIALYSIS: 1000.0000 [IU] | INTRAMUSCULAR | Status: DC | PRN

## 2012-03-08 MED ORDER — THIAMINE HCL 100 MG PO TABS: 100.0000 mg | ORAL_TABLET | Freq: Every day | ORAL | Status: DC

## 2012-03-08 MED ORDER — MECLIZINE HCL 12.5 MG PO TABS: 12.5000 mg | ORAL_TABLET | Freq: Three times a day (TID) | ORAL | Status: DC | PRN

## 2012-03-08 MED ORDER — TRAMADOL HCL 50 MG PO TABS: 50.0000 mg | ORAL_TABLET | Freq: Four times a day (QID) | ORAL | Status: DC | PRN

## 2012-03-08 MED ORDER — HYDRALAZINE HCL 100 MG PO TABS: 100.0000 mg | ORAL_TABLET | Freq: Two times a day (BID) | ORAL | Status: DC

## 2012-03-08 MED ORDER — BENAZEPRIL HCL 10 MG PO TABS: 10.0000 mg | ORAL_TABLET | Freq: Every day | ORAL | Status: DC

## 2012-03-08 MED ORDER — LIDOCAINE-PRILOCAINE 2.5-2.5 % EX CREA
1.0000 "application " | TOPICAL_CREAM | CUTANEOUS | Status: DC | PRN
  Filled 2012-03-08: qty 5

## 2012-03-08 NOTE — Progress Notes (Signed)
[facility] Patient was discharged with instructions and prescriptions. Patient was discharged with instructions on dialysis. Patient was stable upon discharge.

## 2012-03-08 NOTE — Progress Notes (Signed)
PT Cancellation Note  Patient Details Name: [name] MRN: [id number] DOB: July 08, 1955   Cancelled Treatment:    Reason Eval/Treat Not Completed: Other (comment)  Attempted PT session for gait and stairs; Noted for dc today Pt declining amb and stair training  He reports he will not need to negotiate stairs in his home  [name] quite understandably concerned with finding a ride home, wants to get that situated Will return later for PT as time allows   Van Clines [facility] 03/08/2012, 12:26 PM

## 2012-03-08 NOTE — Discharge Summary (Signed)
Internal Medicine Teaching [facility] Discharge Note  Name: [name] MRN: [id number] DOB: 04/20/1955 56 y.o.  Date of Admission: 03/02/2012  7:07 AM Date of Discharge: 03/08/2012 Attending Physician: Aletta Edouard, MD  Discharge Diagnosis: Shortness of breath secondary to volume overload ESRD on hemodialysis Acute normocytic anemia Hyperkalemia Type 2 DM Hypertension Tobacco abuse Cocaine abuse   Discharge Medications:   Medication List     As of 03/08/2012 11:10 AM    STOP taking these medications         losartan 100 MG tablet   Commonly known as: COZAAR      TAKE these medications         benazepril 10 MG tablet   Commonly known as: LOTENSIN   Take 1 tablet (10 mg total) by mouth daily.      calcium carbonate 500 MG chewable tablet   Commonly known as: TUMS - dosed in mg elemental calcium   Chew 2 tablets (400 mg of elemental calcium total) by mouth 2 (two) times daily with a meal.      cholecalciferol 1000 UNITS tablet   Commonly known as: VITAMIN D   Take 1 tablet (1,000 Units total) by mouth daily.      folic acid 1 MG tablet   Commonly known as: FOLVITE   Take 1 tablet (1 mg total) by mouth daily.      hydrALAZINE 50 MG tablet   Commonly known as: APRESOLINE   Take 1 tablet (50 mg total) by mouth 2 (two) times daily.      levETIRAcetam 1000 MG tablet   Commonly known as: KEPPRA   Take 1 tablet (1,000 mg total) by mouth 2 (two) times daily.      meclizine 12.5 MG tablet   Commonly known as: ANTIVERT   Take 1 tablet (12.5 mg total) by mouth 3 (three) times daily as needed for dizziness.      metoprolol 50 MG tablet   Commonly known as: LOPRESSOR   Take 1 tablet (50 mg total) by mouth 2 (two) times daily.      multivitamin with minerals Tabs   Take 1 tablet by mouth daily.      pantoprazole 40 MG tablet   Commonly known as: PROTONIX   Take 1 tablet (40 mg total) by mouth at bedtime.      thiamine 100 MG tablet   Take 1 tablet  (100 mg total) by mouth daily.      traMADol 50 MG tablet   Commonly known as: ULTRAM   Take 1 tablet (50 mg total) by mouth every 6 (six) hours as needed.        Disposition and follow-up:   [name] was discharged from [facility] in fair condition.  At the hospital follow up visit please address  1) Renal function and compliance with outpatient dialysis Pt had L AVF placed for access and was set up for dialysis at [facility]. Please assess patient's compliance with dialysis therapy. 2) Normocytic anemia Pt w normocytic anemia of unclear etiology. It is out of proportion to renal disease, and although he was FOBT positive, he had no gross blood loss. He refused bone marrow biopsy for definitive diagnosis. Please check a CBC at follow-up visit. Baseline hemoglobin is around 7.8-8.8. 3) Diabetes, type 2 Diet controlled. Sugars within normal range during admission. Please assess blood glucose and need for further interventions for diabetes.   Follow-up Appointments:     Follow-up Information  Follow up with Nilda Simmer, MD. In 5 weeks. (Office will call you to arrange your appt (sent))    Contact information:   [address] [place] [address] [phone]       Follow up with Dorrene German, MD. On 03/28/2012. (at 3 pm)    Contact information:   [facility] [address] [place] [address] [phone]        Discharge Orders    Future Orders Please Complete By Expires   Diet - low sodium heart healthy      Increase activity slowly      Discharge instructions      Comments:   Fill your prescriptions and take your medications as prescribed.  It is important to go to ALL of your dialysis treatments.  Follow-up with your new Primary Physician at [facility]. Dr. Concepcion Elk December 10th at 3 pm.      Consultations: Treatment Team:  Irena Cords, MD, Nephrology Ladene Artist, MD Hematology Leonides Sake,  MD Vascular Surgery  Procedures Performed:  Dg Chest 2 View  03/02/2012  *RADIOLOGY REPORT*  Clinical Data: Cough, congestion, shortness of breath, weakness, smoker, hypertension, diabetes, end-stage renal disease on dialysis  CHEST - 2 VIEW  Comparison: None  Findings: Right jugular central venous catheter with tip projecting over right atrium. Mild enlargement of cardiac silhouette with pulmonary vascular congestion. Mediastinal contours normal. Minimal bronchitic changes. No segmental consolidation, pleural effusion or pneumothorax. Question 12 mm diameter nodular density mid to lower right chest, question nipple shadow. Bones demineralized.  IMPRESSION: Enlargement of cardiac silhouette with pulmonary vascular congestion. Question right nipple shadow; repeat PA chest radiograph with nipple markers recommended to exclude pulmonary nodule.   Original Report Authenticated By: Ulyses Southward, M.D.    Admission HPI:  Patient is a 56 y.o. male with a PMHx of ESRD (TTS in [place], [place] - with recent relocation to [place] without establishing with renal, continues to make urine), who presents to [facility] for evaluation of progressively worsening shortness of breath over the last several days. States that the shortness of breath is present both at rest, and worse with exertion. He has also experienced occasionally productive cough over this time frame, without associated fevers, chills, chest tightness, or wheezing. The patient states that his HD schedule is TTS and he last went to dialysis on Saturday (4 days prior to admission), however, did miss his Tuesday dialysis center, as he was not able to travel back to [place]. The patient otherwise denies chest pain, palpitations, dizziness, lightheadedness, blood in his stools or urine. He further denies redness, swelling, or drainage from his R subclavian HD catheter site. He has had a colonoscopy in the last 5-10 years, although he cannot recall where or any results. The  patient's story regarding history of fevers, chills varies per interviewer, however, he denies these symptoms to me. He further denies recent increased use of NSAIDs, history of GI ulcers, and personal or family history of GI malignancies.  Of note, during the ER course, the patient was noted to have hyperkalemia with a Serum K of 6.5, and was administered calcium gluconate, followed by D50 and insulin. Renal service as been consulted, and plan for dialysis. As well, he is noted to be FOBT (+) with Hgb of 6.1, and is therefore being typed and screened.   Hospital Course by problem list: 1) Shortness of breath  Patient presented with shortness of breath, likely secondary to volume overload in setting of noncompliance  with dialysis for ESRD (#2), as also confirmed by CXR showing vascular congestion. Echo showed normal ejection fraction. Suspect that acute anemia (#3) was also contributing, with Hb 5.3 on day of admission. Shortness of breath resolved after dialysis and blood transfusions. Patient did not complain of any SOB after hospital day 2.  2) ESRD on HD (TTS in [place] at [place] dialysis center) with volume overload ESRD possibly secondary to renal dysfunction caused by diabetes, uncontrolled HTN, cocaine abuse. Pt non-compliant w outpt dialysis in [place], [place] (MWF) prior to admission. He was evaluated by nephrology and dialyzed every other day. Pt had left brachiocephalic AVF by vasc surgery on the day prior to discharge. . On the day of discharge, he refused dialysis. He agreed to follow-up with dialysis appointments set up at [facility] upon discharge.   3) Acute normocytic anemia  Patient presented with Hb of 5.3 and symptoms of SOB. Was transfused twice during dialysis sessions while inpatient. Prior records indicate frequent admissions to [facility] for normocytic anemia requiring multiple transfusions in the past. No clear etiology from those records, as patient was FOBT +  but no source of bleeding on EGD, capsule, or colonoscopy performed in [place], as recent as December 2012. Does have h/o H pylori. No evidence of acute blood loss. Elevated LDH but normal haptoglobin. Low retic ct. High ferritin and TIBC. Direct antibody negative. Non specific SPEP results. Dr. Truett Perna with hematology consulted and evaluated patient. Suspect BM process in light of low retic count and no evidence of active hemolysis. Patient refused BM biopsy. Risks of refusing procedure and leaving were discussed and pt competent to make decision. Hb remained stable after initial transfusions, around 8. Will discharge on folic acid, no oral iron supplementation per renal as pt w frequent transfusions and ferritin hight. Patient will receive IV iron and aranesp during dialysis as outpatient.   4) Hyperkalemia  Potassium 6.5 on admission w peaked T waves on EKG. Treated w D50 and insulin in ED. Likely secondary to ongoing ARB use and missing his dialysis sessions. Resolved with dialysis.  5) Diabetes mellitus, type 2 -  Non-insulin dependent, diet controlled at baseline. Did not check A1c at present given likely inaccuracy in setting of acute anemia. Provided sliding scale insulin. No hypoglycemic episodes.  6) Hypertension  BP elevated, likely in setting of volume overload. Home medications include metoprolol, hydralazine, losartan. BP improved with HD. He was treated with hydralazine 50 BID, metoprolol 50 BID, and benazepril 10 qd. Will continue these meds on discharge  7) Tobacco abuse Refused nicotine patch inpatient. Ongoing 1 ppd. Smoking cessation emphasized.  8) Cocaine abuse -  UDS (+), admits to smoking crack cocaine, pt was not willing to discuss in detail. Substance cessation emphasized.   Discharge Vitals:  BP 134/90  Pulse 88  Temp 98.7 F (37.1 C) (Oral)  Resp 18  Ht 5\' 9"  (1.753 m)  Wt 152 lb 1.9 oz (69 kg)  BMI 22.46 kg/m2  SpO2 100%  Physical exam General: sitting in  bed, asking for breakfast  HEENT: absent dentition, dental caries, PERRL, EOMI, no scleral icterus  Cardiac: RRR, no rubs, murmurs or gallops. No JVD  Pulm: clear to auscultation bilaterally, no wheezes, rales, or rhonchi  Abd: soft, nontender, nondistended, BS present  Ext: Clean closed surgical incision over L antecubital area, bandaged with clear adhesive dressing. No erythema, warmth, induration or bleeding. No erythema/edema/warmth/tenderness over R access site.  Neuro: alert and oriented X3, cranial nerves II-XII  grossly intact, strength and sensation to light touch equal in bilateral upper and lower extremities  Discharge Labs:  Results for orders placed during the hospital encounter of 03/02/12 (from the past 24 hour(s))  CBC     Status: Abnormal   Collection Time   03/07/12 11:40 AM      Component Value Range   WBC 9.1  4.0 - 10.5 K/uL   RBC 2.94 (*) 4.22 - 5.81 MIL/uL   Hemoglobin 8.6 (*) 13.0 - 17.0 g/dL   HCT 40.9 (*) 81.1 - 91.4 %   MCV 86.7  78.0 - 100.0 fL   MCH 29.3  26.0 - 34.0 pg   MCHC 33.7  30.0 - 36.0 g/dL   RDW 78.2  95.6 - 21.3 %   Platelets 206  150 - 400 K/uL  BASIC METABOLIC PANEL     Status: Abnormal   Collection Time   03/07/12 11:40 AM      Component Value Range   Sodium 129 (*) 135 - 145 mEq/L   Potassium 4.5  3.5 - 5.1 mEq/L   Chloride 94 (*) 96 - 112 mEq/L   CO2 23  19 - 32 mEq/L   Glucose, Bld 127 (*) 70 - 99 mg/dL   BUN 65 (*) 6 - 23 mg/dL   Creatinine, Ser 0.86 (*) 0.50 - 1.35 mg/dL   Calcium 7.8 (*) 8.4 - 10.5 mg/dL   GFR calc non Af Amer 6 (*) >90 mL/min   GFR calc Af Amer 7 (*) >90 mL/min    Signed: Bronson Curb 03/08/2012, 11:10 AM   Time Spent on Discharge: 35 min Services Ordered on Discharge: none Equipment Ordered on Discharge: none

## 2012-03-08 NOTE — Progress Notes (Signed)
03/08/2012  [address] RN, [place]  [phone] Spoke with patient regarding discharge plans, home address and contact information. He referred CM to call [facility] Daven[facility] [phone] for the information. States he will be living at that residence and they will provide transport for HD  Dois Davenport:  [phone] Address: [address]  [place], [place]  [address]    Gave patient phone # for locating PCP:   [facility]

## 2012-03-08 NOTE — Progress Notes (Signed)
Subjective: Pt resting in bed. Wants to leave hospital. Refusing BM biopsy. Dr. Truett Perna also discussed need for BM bx with pt while I was in room, pt continued to refuse. Acknowledged risks of refusing procedure.  Hb has been stable over the past couple of days, around 7.5-8.5. To go to dialysis today. Denies CP, SOB, N/V, dizziness, abdominal pain, diarrhea.  Objective: Vital signs in last 24 hours: Filed Vitals:   03/07/12 1400 03/07/12 1700 03/07/12 2240 03/08/12 0515  BP: 141/71 157/65 170/64 159/62  Pulse: 79 70 74 69  Temp: 98.2 F (36.8 C) 98.2 F (36.8 C) 98.2 F (36.8 C) 98 F (36.7 C)  TempSrc: Oral Oral Oral Oral  Resp: 20 20 18 17   Height:      Weight:   152 lb 1.9 oz (69 kg)   SpO2: 94% 92% 100% 100%   Weight change: 3 lb 4.9 oz (1.5 kg)  Intake/Output Summary (Last 24 hours) at 03/08/12 0815 Last data filed at 03/07/12 2143  Gross per 24 hour  Intake    470 ml  Output    100 ml  Net    370 ml   Vitals reviewed. General: sitting in bed, asking for breakfast HEENT: absent dentition, dental caries, PERRL, EOMI, no scleral icterus Cardiac: RRR, no rubs, murmurs or gallops. No JVD Pulm: clear to auscultation bilaterally, no wheezes, rales, or rhonchi Abd: soft, nontender, nondistended, BS present Ext: Clean closed surgical incision over L antecubital area, bandaged with clear adhesive dressing. No erythema, warmth, induration or bleeding.  No erythema/edema/warmth/tenderness over R access site.  Neuro: alert and oriented X3, cranial nerves II-XII grossly intact, strength and sensation to light touch equal in bilateral upper and lower extremities  Lab Results: Basic Metabolic Panel:  Lab 03/07/12 7829 03/07/12 0719 03/04/12 0500 03/03/12 0726 03/02/12 1145  NA 129* 133* -- -- --  K 4.5 4.5 -- -- --  CL 94* -- 97 -- --  CO2 23 -- 27 -- --  GLUCOSE 127* 90 -- -- --  BUN 65* -- 78* -- --  CREATININE 8.98* -- 7.88* -- --  CALCIUM 7.8* -- 7.6* -- --  MG -- --  -- -- 2.0  PHOS -- -- 5.3* 5.5* --   Liver Function Tests:  Lab 03/04/12 0500 03/03/12 0726 03/02/12 0950  AST -- -- 16  ALT -- -- 12  ALKPHOS -- -- 38*  BILITOT -- -- 0.2*  PROT -- -- 6.3  ALBUMIN 2.4* 2.2* --   CBC:  Lab 03/07/12 1140 03/07/12 0719 03/04/12 0500 03/02/12 0726  WBC 9.1 -- 4.9 --  NEUTROABS -- -- -- 6.3  HGB 8.6* 7.8* -- --  HCT 25.5* 23.0* -- --  MCV 86.7 -- 84.3 --  PLT 206 -- 135* --   Cardiac Enzymes:  Lab 03/03/12 0726 03/02/12 2339  CKTOTAL -- --  CKMB -- --  CKMBINDEX -- --  TROPONINI <0.30 <0.30   CBG:  Lab 03/03/12 0748 03/02/12 1910 03/02/12 1328  GLUCAP 104* 84 82   Fasting Lipid Panel:  Lab 03/02/12 1059  CHOL 113  HDL 38*  LDLCALC 62  TRIG 65  CHOLHDL 3.0  LDLDIRECT --   Coagulation:  Lab 03/02/12 0950  LABPROT 14.5  INR 1.15   Anemia Panel:  Lab 03/02/12 0950  VITAMINB12 532  FOLATE 7.3  FERRITIN 287  TIBC 212*  IRON 23*  RETICCTPCT 0.7   Urine Drug Screen: Drugs of Abuse     Component Value Date/Time  LABOPIA NONE DETECTED 03/02/2012 0728   COCAINSCRNUR POSITIVE* 03/02/2012 0728   LABBENZ NONE DETECTED 03/02/2012 0728   AMPHETMU NONE DETECTED 03/02/2012 0728   THCU NONE DETECTED 03/02/2012 0728   LABBARB NONE DETECTED 03/02/2012 0728    Urinalysis:  Lab 03/02/12 0728  COLORURINE YELLOW  LABSPEC 1.016  PHURINE 7.5  GLUCOSEU 250*  HGBUR SMALL*  BILIRUBINUR NEGATIVE  KETONESUR NEGATIVE  PROTEINUR >300*  UROBILINOGEN 0.2  NITRITE NEGATIVE  LEUKOCYTESUR NEGATIVE    Micro Results: Recent Results (from the past 240 hour(s))  CULTURE, BLOOD (ROUTINE X 2)     Status: Normal (Preliminary result)   Collection Time   03/02/12  9:32 AM      Component Value Range Status Comment   Specimen Description BLOOD RIGHT HAND   Final    Special Requests BOTTLES DRAWN AEROBIC AND ANAEROBIC 10CC   Final    Culture  Setup Time 03/02/2012 16:13   Final    Culture     Final    Value:        BLOOD CULTURE RECEIVED  NO GROWTH TO DATE CULTURE WILL BE HELD FOR 5 DAYS BEFORE ISSUING A FINAL NEGATIVE REPORT   Report Status PENDING   Incomplete   CULTURE, BLOOD (ROUTINE X 2)     Status: Normal (Preliminary result)   Collection Time   03/02/12  9:49 AM      Component Value Range Status Comment   Specimen Description BLOOD RIGHT HAND   Final    Special Requests BOTTLES DRAWN AEROBIC AND ANAEROBIC 10CC   Final    Culture  Setup Time 03/02/2012 16:13   Final    Culture     Final    Value:        BLOOD CULTURE RECEIVED NO GROWTH TO DATE CULTURE WILL BE HELD FOR 5 DAYS BEFORE ISSUING A FINAL NEGATIVE REPORT   Report Status PENDING   Incomplete    Medications: I have reviewed the patient's current medications. Scheduled Meds:    . benazepril  10 mg Oral Daily  . calcium carbonate  400 mg of elemental calcium Oral BID WC  . cholecalciferol  1,000 Units Oral Daily  . darbepoetin (ARANESP) injection - DIALYSIS  200 mcg Intravenous Q Thu-HD  . ferric gluconate (FERRLECIT/NULECIT) IV  125 mg Intravenous Q T,Th,Sa-HD  . folic acid  1 mg Oral Daily  . hydrALAZINE  50 mg Oral BID  . levETIRAcetam  1,000 mg Oral BID  . metoprolol tartrate  50 mg Oral BID  . multivitamin with minerals  1 tablet Oral Daily  . pantoprazole  40 mg Oral QHS  . sodium chloride  3 mL Intravenous Q12H  . thiamine  100 mg Oral Daily   Or  . thiamine  100 mg Intravenous Daily   Continuous Infusions:  PRN Meds:.sodium chloride, sodium chloride, [EXPIRED] alteplase, feeding supplement (NEPRO CARB STEADY), heparin, heparin, lidocaine, lidocaine-prilocaine, meclizine, oxyCODONE, pentafluoroprop-tetrafluoroeth, traMADol, [DISCONTINUED] 0.9 % irrigation (POUR BTL), [DISCONTINUED] bupivacaine, [DISCONTINUED] heparin 6000 unit irrigation, [DISCONTINUED]  HYDROmorphone (DILAUDID) injection, [DISCONTINUED] lidocaine-EPINEPHrine [DISCONTINUED] ondansetron (ZOFRAN) IV, [DISCONTINUED] Surgifoam 100 with Thrombin 20,000 units (20 ml) topical  solution  Assessment/Plan: Pt is a 56 y.o. yo male with a PMHx of ESRD on HD (TTS in [place], [place]), HTN, DM2 who was admitted on 03/02/2012 with symptoms of progressively worsening shortness of breath, which is likely secondary to noncompliance with dialysis schedule. His symptoms have resolved with dialysis  1) Shortness of breath - Resolved. Likely secondary to  volume overload in setting of noncompliance with dialysis schedule, as also confirmed by CXR showing vascular congestion. Anemia likely also contributing, Hb 5.3 on day of admission. EF 60-65%.  2) ESRD on HD (TTS in [place] at [place])  ESRD possibly secondary to renal dysfunction caused by diabetes, uncontrolled HTN, cocaine abuse. Pt non-compliant w outpt dialysis in [place], appears he received most of his dialysis through [facility] when he presented to ED for volume overload . Pt had left brachiocephalic AVF by vasc surg yesterday. - MWF HD - dialysis center to arrange outpatient dialysis  3) Acute normocytic anemia  Prior records indicate frequent admissions for normocytic anemia requiring multiple transfusions in the past. No clear etiology, FOBT + but no source on EGD, capsule, or colonoscopy performed in [place], as recent as December 2012 (full records in shadow chart). Does have h/o H pylori. No evidence of acute blood loss. Elevated LDH but normal haptoglobin.  Low retic ct. High ferritin and TIBC. Direct antibody negative. Suspect BM process in light of low retic count and no evidence of active hemolysis. Patient is refusing BM biopsy and demanding to leave hospital today. Risks of refusing procedure and leaving were discussed and pt competent to make decision. Hb has been stable over the past few days, around 8.  - Hb electrophoresis pending  4) Hyperkalemia  Resolved. Likely secondary to ongoing ARB use and missing his dialysis session.   - dialysis as per above  5) Diabetes mellitus,  type 2 - Non-insulin dependent, diet controlled at baseline. Will not check A1c at present given likely inaccuracy in setting of acute anemia.  - CBG monitoring and SSI  6) Hypertension  BP elevated, likely in setting of volume overload. Home medications include metoprolol, Hydralazine, Losartan. Expect improvement after HD.  - Hydralazine 50 BID, Metoprolol 50 BID, Benazepril 10 qd  7) Tobacco abuse -  Refuses nicotine patch inpatient. Ongoing 1 ppd.  - SW consult for cessation counseling    8) Cocaine abuse -  UDS (+), admits to smoking crack cocaine, but will not discuss specifics.  - SW consult for cessation counseling .    Dispo: Disposition is deferred at this time, awaiting improvement of current medical problems.  Anticipated discharge is today.   The patient does not have current PCP (No primary provider on file.), therefore will not be requiring OPC follow-up after discharge.   The patient does not have transportation limitations that hinder transportation to clinic appointments.  .Services Needed at time of discharge: Y = Yes, Blank = No PT:   OT:   RN:   Equipment:   Other:     LOS: 6 days   Bronson Curb 03/08/2012, 8:15 AM

## 2012-03-08 NOTE — Discharge Summary (Signed)
Internal Medicine Teaching Service Attending Note Date: 03/08/2012  Patient name: [name]  Medical record number: [id number]  Date of birth: March 30, 1956   I saw the patient today and reviewed the plan of care with my resident team.   Subjective: [name]. [name] feels well. He received his AV fistula yesterday.   Objective: Vitals reviewed. Filed Vitals:   03/07/12 2240 03/08/12 0515 03/08/12 0815 03/08/12 1400  BP: 170/64 159/62 134/90 150/72  Pulse: 74 69 88 64  Temp: 98.2 F (36.8 C) 98 F (36.7 C) 98.7 F (37.1 C) 97.6 F (36.4 C)  TempSrc: Oral Oral Oral Oral  Resp: 18 17 18 16   Height:      Weight: 152 lb 1.9 oz (69 kg)   147 lb 0.8 oz (66.7 kg)  SpO2: 100% 100% 100%    General: Resting in bed. HEENT: PERRL, EOMI, no scleral icterus. Heart: RRR, no rubs, murmurs or gallops. Lungs: Clear to auscultation bilaterally, no wheezes, rales, or rhonchi. Abdomen: Soft, nontender, nondistended, BS present. Extremities: Warm, no pedal edema, surgically closed clean wound in left antecubital fossa. Neuro: Alert and oriented X3, grossly non-focal.  Relevant Labs  Lab 03/07/12 1140 03/07/12 0719 03/04/12 0500 03/03/12 0726 03/02/12 0951  NA 129* 133* 136 137 143    Lab 03/07/12 1140 03/04/12 0500 03/03/12 0726 03/02/12 0951 03/02/12 0726  CREATININE 8.98* 7.88* 6.14* 13.66* 13.55*    Lab 03/07/12 1140 03/07/12 0719 03/04/12 0500 03/03/12 0726 03/02/12 1407  K 4.5 4.5 4.1 3.7 6.5*    Lab 03/08/12 1400 03/07/12 1140 03/07/12 0719 03/06/12 0015 03/05/12 1845  HGB 7.7* 8.6* 7.8* 8.0* 7.8*   Assessment and Plan [name] has refused his BM biopsy. His hemoglobin is stable at this time. His shortness of breath has resolved. Getting hemodialysis today. He needs to be set up for outpatient dialysis. Hyperkalmeia is resolved. Also needs to be seen by Dr. Imogene Burn who will monitor his fistula, in 4-6 weeks.  Follow up appointments have been made.  Rest, I agree with Dr. Wyvonnia Dusky  note.  Thanks Aletta Edouard, MD 03/08/2012, 2:34 PM

## 2012-03-08 NOTE — Progress Notes (Signed)
Patient ID: [name], male   DOB: 1955/05/04, 56 y.o.   MRN: [id number]   [facility] Progress Note    Subjective:   The patient reports to be feeling fair, underwent first stage of left upper arm BVT yesterday. His disposition remains in limbo as we cannot verify whether he will live once he leaves the hospital. As such, dialysis unit cannot be set up without this verification.    Objective:   BP 134/90  Pulse 88  Temp 98.7 F (37.1 C) (Oral)  Resp 18  Ht 5\' 9"  (1.753 m)  Wt 69 kg (152 lb 1.9 oz)  BMI 22.46 kg/m2  SpO2 100%  Physical Exam: Gen: Comfortably resting in bed CVS: Pulse regular in rate and rhythm, heart sounds S1 and S2 normal Resp: Clear to auscultation bilaterally, no rales/rhonchi Abd: Soft, flat, nontender and bowel sounds are normal Ext: No lower extremity edema. Faintly palpable thrill left upper arm-bruit audible.  Labs: BMET  Lab 03/07/12 1140 03/07/12 0719 03/04/12 0500 03/03/12 0726 03/02/12 1407 03/02/12 0951 03/02/12 0726  NA 129* 133* 136 137 -- 143 142  K 4.5 4.5 4.1 3.7 6.5* 7.0* 6.5*  CL 94* -- 97 98 -- 104 102  CO2 23 -- 27 28 -- 18* 18*  GLUCOSE 127* 90 125* 105* -- 80 86  BUN 65* -- 78* 56* -- 135* 135*  CREATININE 8.98* -- 7.88* 6.14* -- 13.66* 13.55*  ALB -- -- -- -- -- -- --  CALCIUM 7.8* -- 7.6* 7.8* -- 7.4* 7.6*  PHOS -- -- 5.3* 5.5* -- 7.3* --   CBC  Lab 03/07/12 1140 03/07/12 0719 03/06/12 0015 03/05/12 1845 03/04/12 0500 03/03/12 0726 03/02/12 1145 03/02/12 0726  WBC 9.1 -- -- -- 4.9 4.3 8.7 --  NEUTROABS -- -- -- -- -- -- -- 6.3  HGB 8.6* 7.8* 8.0* 7.8* -- -- -- --  HCT 25.5* 23.0* 23.7* 23.1* -- -- -- --  MCV 86.7 -- -- -- 84.3 84.2 83.4 --  PLT 206 -- -- -- 135* 142* 154 --    Medications:      . benazepril  10 mg Oral Daily  . calcium carbonate  400 mg of elemental calcium Oral BID WC  . cholecalciferol  1,000 Units Oral Daily  . darbepoetin (ARANESP) injection - DIALYSIS  200 mcg Intravenous Q  Thu-HD  . ferric gluconate (FERRLECIT/NULECIT) IV  125 mg Intravenous Q T,Th,Sa-HD  . folic acid  1 mg Oral Daily  . hydrALAZINE  50 mg Oral BID  . levETIRAcetam  1,000 mg Oral BID  . metoprolol tartrate  50 mg Oral BID  . multivitamin with minerals  1 tablet Oral Daily  . pantoprazole  40 mg Oral QHS  . sodium chloride  3 mL Intravenous Q12H  . thiamine  100 mg Oral Daily  . [DISCONTINUED] thiamine  100 mg Intravenous Daily     Assessment/ Plan:   1.History of GI bleed - had a workup done in [place], current workup deferred by gastroenterology given stability of his hemoglobin counts.  2.ESRD: He declined yesterday's dialysis treatment and she feels overwhelmed status post access placement. Somewhat resistant to getting it today and compromises on getting it tomorrow. Permanent access placed yesterday (this will facilitate his acceptance into a local HD unit- [facility])- social situation (living) needs to be verified prior to DC  3. 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 Nutrition: On renal diet, will f/u albumin and decide on need for ONS  6. Hypertension: Fair BP control, monitor post-HD   Zetta Bills, MD 03/08/2012, 10:27 AM

## 2012-03-08 NOTE — Telephone Encounter (Addendum)
Message copied by Shari Prows on Wed Mar 08, 2012  2:32 PM ------      Message from: [place], [place] K      Created: Wed Mar 08, 2012 11:50 AM      Regarding: schedule                   ----- Message -----         From: Dara Lords, PA         Sent: 03/07/2012   9:32 AM           To: Sharee Pimple, CMA            S/p 1st stage BVT by Dr. Imogene Burn today.  F/u with him in 4-6 weeks.            Thanks,      Lelon Mast  I scheduled an appt for this pt on 04/07/12 at 8:45am with [facility]. I mailed an appt letter to the patient but was unable to reach the pt by phone. We have no phone numbers available for the patient. awt

## 2012-03-08 NOTE — Procedures (Signed)
Patient seen on Hemodialysis. QB 400, UF goal 4.5L Treatment adjusted as needed.  Zetta Bills MD [facility]. Office # [phone] Pager # [phone] 2:14 PM

## 2012-03-08 NOTE — Progress Notes (Signed)
Nutrition Follow-up  Intervention:  Eating well at this time, continue current interventions.   Assessment:   Underwent placement of L AVF on 11/19. Pt refusing BM biopsy. Consuming 75-100% of meals. Per nephrology note, pt's d/c plan remains in limbo 2/2 social situation.  Diet Order:  Renal 60-70; 1200 ml fluid restriction  Meds: Scheduled Meds:   . benazepril  10 mg Oral Daily  . calcium carbonate  400 mg of elemental calcium Oral BID WC  . cholecalciferol  1,000 Units Oral Daily  . darbepoetin (ARANESP) injection - DIALYSIS  200 mcg Intravenous Q Thu-HD  . ferric gluconate (FERRLECIT/NULECIT) IV  125 mg Intravenous Q T,Th,Sa-HD  . folic acid  1 mg Oral Daily  . hydrALAZINE  50 mg Oral BID  . levETIRAcetam  1,000 mg Oral BID  . metoprolol tartrate  50 mg Oral BID  . multivitamin with minerals  1 tablet Oral Daily  . pantoprazole  40 mg Oral QHS  . sodium chloride  3 mL Intravenous Q12H  . thiamine  100 mg Oral Daily  . [DISCONTINUED] thiamine  100 mg Intravenous Daily   Continuous Infusions:  PRN Meds:.sodium chloride, sodium chloride, [EXPIRED] alteplase, feeding supplement (NEPRO CARB STEADY), heparin, heparin, lidocaine, lidocaine-prilocaine, meclizine, oxyCODONE, pentafluoroprop-tetrafluoroeth, traMADol, [DISCONTINUED]  HYDROmorphone (DILAUDID) injection, [DISCONTINUED] ondansetron (ZOFRAN) IV   CMP     Component Value Date/Time   NA 129* 03/07/2012 1140   K 4.5 03/07/2012 1140   CL 94* 03/07/2012 1140   CO2 23 03/07/2012 1140   GLUCOSE 127* 03/07/2012 1140   BUN 65* 03/07/2012 1140   CREATININE 8.98* 03/07/2012 1140   CALCIUM 7.8* 03/07/2012 1140   PROT 6.3 03/02/2012 0950   ALBUMIN 2.4* 03/04/2012 0500   AST 16 03/02/2012 0950   ALT 12 03/02/2012 0950   ALKPHOS 38* 03/02/2012 0950   BILITOT 0.2* 03/02/2012 0950   GFRNONAA 6* 03/07/2012 1140   GFRAA 7* 03/07/2012 1140   Phosphorus  Date/Time Value Range Status  03/04/2012  5:00 AM 5.3* 2.3 - 4.6 mg/dL Final      CBG (last 3)  No results found for this basename: GLUCAP:3 in the last 72 hours   Intake/Output Summary (Last 24 hours) at 03/08/12 1027 Last data filed at 03/08/12 0830  Gross per 24 hour  Intake    460 ml  Output    225 ml  Net    235 ml  BM 11/18  Weight Status:  140 lb s/p HD on 11/16  Estimated needs:  1900 - 2000 kcal, 85 - 95 grams protein, 1.2 liters fluid restriction  Nutrition Dx:  Not ready for diet/lifestyle change r/t HD diet AEB pt verbalization.  Goal:  Diet compliance.  Monitor:  PO intake, weights, labs, I/O's, readiness for education  Jarold Motto MS, RD, LDN Pager: [phone] After-hours pager: [phone]

## 2012-03-08 NOTE — Progress Notes (Addendum)
03/08/2012 12:01 PM Hemodialysis Outpatient Note; This patient has been accepted at the [facility] dialysis center on a TTS 2nd shift schedule. If the patient gets discharged today the center can begin treatment tomorrow 11.21.13 at 11:00, if the patient does not get discharged today he can begin at the center on Saturday 11.23.13 at 11:00 providing he gets to the center on Friday at 11:00 to sign his paperwork. Thank you.Tilman Neat 03/08/2012 12:09 PM Spoke with patients' friend Dois Davenport who gave as her address; [address],[place] [address], phone # [phone]. Ms Dois Davenport confirmed that patient would be staying with her.

## 2012-03-09 ENCOUNTER — Encounter (HOSPITAL_COMMUNITY): Payer: Self-pay | Admitting: Vascular Surgery

## 2012-04-06 ENCOUNTER — Encounter: Payer: Self-pay | Admitting: Vascular Surgery

## 2012-04-07 ENCOUNTER — Ambulatory Visit: Payer: Medicare Other | Admitting: Vascular Surgery

## 2012-05-04 ENCOUNTER — Inpatient Hospital Stay (HOSPITAL_COMMUNITY)
Admission: EM | Admit: 2012-05-04 | Discharge: 2012-05-07 | DRG: 377 | Disposition: A | Discharge status: Home / Self Care | Payer: Medicare Other | Attending: Internal Medicine | Admitting: Internal Medicine

## 2012-05-04 ENCOUNTER — Encounter (HOSPITAL_COMMUNITY): Payer: Self-pay | Admitting: Internal Medicine

## 2012-05-04 DIAGNOSIS — K625 Hemorrhage of anus and rectum: Secondary | ICD-10-CM

## 2012-05-04 DIAGNOSIS — D638 Anemia in other chronic diseases classified elsewhere: Secondary | ICD-10-CM | POA: Diagnosis present

## 2012-05-04 DIAGNOSIS — Z79899 Other long term (current) drug therapy: Secondary | ICD-10-CM

## 2012-05-04 DIAGNOSIS — D649 Anemia, unspecified: Secondary | ICD-10-CM

## 2012-05-04 DIAGNOSIS — I10 Essential (primary) hypertension: Secondary | ICD-10-CM

## 2012-05-04 DIAGNOSIS — E119 Type 2 diabetes mellitus without complications: Secondary | ICD-10-CM

## 2012-05-04 DIAGNOSIS — Z860100 Personal history of colon polyps, unspecified: Secondary | ICD-10-CM

## 2012-05-04 DIAGNOSIS — E875 Hyperkalemia: Secondary | ICD-10-CM

## 2012-05-04 DIAGNOSIS — E8770 Fluid overload, unspecified: Secondary | ICD-10-CM

## 2012-05-04 DIAGNOSIS — I509 Heart failure, unspecified: Secondary | ICD-10-CM | POA: Diagnosis present

## 2012-05-04 DIAGNOSIS — Z9119 Patient's noncompliance with other medical treatment and regimen: Secondary | ICD-10-CM

## 2012-05-04 DIAGNOSIS — Z91199 Patient's noncompliance with other medical treatment and regimen due to unspecified reason: Secondary | ICD-10-CM

## 2012-05-04 DIAGNOSIS — K922 Gastrointestinal hemorrhage, unspecified: Secondary | ICD-10-CM

## 2012-05-04 DIAGNOSIS — Z992 Dependence on renal dialysis: Secondary | ICD-10-CM

## 2012-05-04 DIAGNOSIS — Z720 Tobacco use: Secondary | ICD-10-CM

## 2012-05-04 DIAGNOSIS — G40909 Epilepsy, unspecified, not intractable, without status epilepticus: Secondary | ICD-10-CM

## 2012-05-04 DIAGNOSIS — B1920 Unspecified viral hepatitis C without hepatic coma: Secondary | ICD-10-CM | POA: Diagnosis present

## 2012-05-04 DIAGNOSIS — N186 End stage renal disease: Secondary | ICD-10-CM | POA: Diagnosis present

## 2012-05-04 DIAGNOSIS — I5030 Unspecified diastolic (congestive) heart failure: Secondary | ICD-10-CM | POA: Diagnosis present

## 2012-05-04 DIAGNOSIS — I120 Hypertensive chronic kidney disease with stage 5 chronic kidney disease or end stage renal disease: Secondary | ICD-10-CM | POA: Diagnosis present

## 2012-05-04 DIAGNOSIS — K921 Melena: Principal | ICD-10-CM | POA: Diagnosis present

## 2012-05-04 DIAGNOSIS — F1410 Cocaine abuse, uncomplicated: Secondary | ICD-10-CM

## 2012-05-04 DIAGNOSIS — F17200 Nicotine dependence, unspecified, uncomplicated: Secondary | ICD-10-CM | POA: Diagnosis present

## 2012-05-04 HISTORY — DX: Gastrointestinal hemorrhage, unspecified: K92.2

## 2012-05-04 LAB — PROTIME-INR
INR: 1.05 (ref 0.00–1.49)
Prothrombin Time: 13.6 s (ref 11.6–15.2)

## 2012-05-04 LAB — APTT: aPTT: 40 seconds — ABNORMAL HIGH (ref 24–37)

## 2012-05-04 LAB — POCT I-STAT, CHEM 8
BUN: 48 mg/dL — ABNORMAL HIGH (ref 6–23)
Calcium, Ion: 0.77 mmol/L — ABNORMAL LOW (ref 1.12–1.23)
Chloride: 98 mEq/L (ref 96–112)
HCT: 21 % — ABNORMAL LOW (ref 39.0–52.0)
Potassium: 3.1 mEq/L — ABNORMAL LOW (ref 3.5–5.1)
Sodium: 142 mEq/L (ref 135–145)

## 2012-05-04 LAB — PREPARE RBC (CROSSMATCH)

## 2012-05-04 LAB — BASIC METABOLIC PANEL
CO2: 27 mEq/L (ref 19–32)
Calcium: 6.7 mg/dL — ABNORMAL LOW (ref 8.4–10.5)
Glucose, Bld: 139 mg/dL — ABNORMAL HIGH (ref 70–99)
Sodium: 140 mEq/L (ref 135–145)

## 2012-05-04 LAB — CBC WITH DIFFERENTIAL/PLATELET
Eosinophils Relative: 3 % (ref 0–5)
HCT: 20 % — ABNORMAL LOW (ref 39.0–52.0)
Lymphocytes Relative: 16 % (ref 12–46)
Lymphs Abs: 0.9 10*3/uL (ref 0.7–4.0)
MCV: 84.4 fL (ref 78.0–100.0)
Monocytes Absolute: 0.6 10*3/uL (ref 0.1–1.0)
Platelets: 222 10*3/uL (ref 150–400)
RBC: 2.37 MIL/uL — ABNORMAL LOW (ref 4.22–5.81)
WBC: 5.9 10*3/uL (ref 4.0–10.5)

## 2012-05-04 LAB — GLUCOSE, CAPILLARY
Glucose-Capillary: 122 mg/dL — ABNORMAL HIGH (ref 70–99)
Glucose-Capillary: 124 mg/dL — ABNORMAL HIGH (ref 70–99)

## 2012-05-04 MED ORDER — PANTOPRAZOLE SODIUM 40 MG IV SOLR
40.0000 mg | Freq: Once | INTRAVENOUS | Status: AC
  Administered 2012-05-04: 40 mg via INTRAVENOUS
  Filled 2012-05-04: qty 40

## 2012-05-04 MED ORDER — ACETAMINOPHEN 325 MG PO TABS: 650.0000 mg | ORAL_TABLET | Freq: Four times a day (QID) | ORAL | Status: DC | PRN

## 2012-05-04 MED ORDER — ACETAMINOPHEN 650 MG RE SUPP: 650.0000 mg | Freq: Four times a day (QID) | RECTAL | Status: DC | PRN

## 2012-05-04 MED ORDER — SODIUM CHLORIDE 0.9 % IV SOLN
1000.0000 mg | Freq: Two times a day (BID) | INTRAVENOUS | Status: DC
  Administered 2012-05-04: 1000 mg via INTRAVENOUS
  Filled 2012-05-04 (×3): qty 10

## 2012-05-04 MED ORDER — SODIUM CHLORIDE 0.9 % IJ SOLN
3.0000 mL | Freq: Two times a day (BID) | INTRAMUSCULAR | Status: DC
  Administered 2012-05-05 – 2012-05-06 (×5): 3 mL via INTRAVENOUS

## 2012-05-04 MED ORDER — ONDANSETRON HCL 4 MG/2ML IJ SOLN: 4.0000 mg | Freq: Four times a day (QID) | INTRAMUSCULAR | Status: DC | PRN

## 2012-05-04 MED ORDER — PANTOPRAZOLE SODIUM 40 MG IV SOLR
40.0000 mg | Freq: Two times a day (BID) | INTRAVENOUS | Status: DC
  Administered 2012-05-05: 40 mg via INTRAVENOUS
  Filled 2012-05-04 (×3): qty 40

## 2012-05-04 MED ORDER — HYDRALAZINE HCL 20 MG/ML IJ SOLN
10.0000 mg | INTRAMUSCULAR | Status: DC | PRN
  Administered 2012-05-06: 10 mg via INTRAVENOUS
  Filled 2012-05-04 (×2): qty 0.5

## 2012-05-04 MED ORDER — ONDANSETRON HCL 4 MG PO TABS: 4.0000 mg | ORAL_TABLET | Freq: Four times a day (QID) | ORAL | Status: DC | PRN

## 2012-05-04 NOTE — ED Notes (Signed)
[facility]- pt was sent from dialysis due to feeling weak and tired. Pt received some of his treatment.  Pt states that he noticed blood in his stool last night. BP 110/60. HR 83.

## 2012-05-04 NOTE — ED Provider Notes (Signed)
History     CSN: [id number]  Arrival date & time 05/04/12  1631   First MD Initiated Contact with Patient 05/04/12 1634      Chief Complaint  Patient presents with  . Rectal Bleeding    (Consider location/radiation/quality/duration/timing/severity/associated sxs/prior treatment) HPI Comments: Patient is a 57 y/o male with a history of ESRD who presents to the ED today complaining of low hemoglobin.  He was at dialysis today when he was told that his hemoglobin was low and that he should come to the ED.  He also complains of rectal bleeding x1 day.  Patient states that he saw a large amount of bright red blood in the toilet last night after having a bowel movement.  He states that he has never had anything like this before, and has not had a bowel movement since yesterday.  Patient complains of nausea and vomiting x2 today.  He states that his vomit was red.  Patient complains of associated non radiating mid epigastric crampy pain.  Pain is rated a 8/10.   Pain has been present since earlier today.  Patient denies any fever or chills.  Patient is currently not on any blood thinners.  Patient is currently not on any NSAIDS.    Patient is a 57 y.o. male presenting with hematochezia. The history is provided by the patient. No language interpreter was used.  Rectal Bleeding  Associated symptoms include abdominal pain, nausea and vomiting. Pertinent negatives include no fever, no hemorrhoids, no rectal pain, no chest pain and no difficulty breathing. Associated symptoms comments: No shortness of breath.    Past Medical History  Diagnosis Date  . Hypertension   . Diabetes mellitus without complication     on oral pills only  . ESRD (end stage renal disease) on dialysis since 2012  . Seizure disorder     questionable history of - will need to clarify with PCP  . Anemia, chronic disease   . CHF (congestive heart failure)     diastolic.  EF 60 - 65% per [facility] eco 11/2011  . Cocaine abuse     mentioned in notes from [place]  . Hepatitis C antibody test positive     was HIV negative, 02/28/12  . Hepatitis B core antibody positive     03/01/10  . Positive QuantiFERON-TB Gold test     11/2011  . Helicobacter pylori gastritis     not defined if this was treated  . Polyp of colon, adenomatous     May 2012.  Dr [name] in [place]  . Shortness of breath     Past Surgical History  Procedure Date  . Shoulder surgery   . Knee surgery   . Bascilic vein transposition 03/07/2012    Procedure: BASCILIC VEIN TRANSPOSITION;  Surgeon: Fransisco Hertz, MD;  Location: [facility] OR;  Service: Vascular;  Laterality: Left;  First Stage    Family History  Problem Relation Age of Onset  . Diabetes Mother   . Hypertension Mother   . Stroke Mother   . Kidney failure Mother     History  Substance Use Topics  . Smoking status: Current Every Day Smoker -- 1.0 packs/day for 15 years    Types: Cigarettes  . Smokeless tobacco: Not on file  . Alcohol Use: Yes     Comment: pt does not remember the last time he drank.      Review of Systems  Constitutional: Positive for chills and fatigue. Negative for fever and  unexpected weight change.  Respiratory: Negative for shortness of breath.   Cardiovascular: Negative for chest pain.  Gastrointestinal: Positive for nausea, vomiting, abdominal pain, blood in stool, hematochezia and anal bleeding. Negative for rectal pain and hemorrhoids.       Negative melena  Neurological: Positive for dizziness and light-headedness. Negative for syncope.  All other systems reviewed and are negative.    Allergies  Reglan  Home Medications   Current Outpatient Rx  Name  Route  Sig  Dispense  Refill  . BENAZEPRIL HCL 10 MG PO TABS   Oral   Take 1 tablet (10 mg total) by mouth daily.   30 tablet   2   . FOLIC ACID 1 MG PO TABS   Oral   Take 1 tablet (1 mg total) by mouth daily.   30 tablet   2   . HYDRALAZINE HCL 50 MG PO TABS   Oral   Take 1  tablet (50 mg total) by mouth 2 (two) times daily.   60 tablet   2   . LEVETIRACETAM 1000 MG PO TABS   Oral   Take 1 tablet (1,000 mg total) by mouth 2 (two) times daily.   60 tablet   2   . METOPROLOL TARTRATE 50 MG PO TABS   Oral   Take 1 tablet (50 mg total) by mouth 2 (two) times daily.   60 tablet   2   . PANTOPRAZOLE SODIUM 40 MG PO TBEC   Oral   Take 1 tablet (40 mg total) by mouth at bedtime.   30 tablet   1   . THIAMINE HCL 100 MG PO TABS   Oral   Take 1 tablet (100 mg total) by mouth daily.   30 tablet   2   . TRAMADOL HCL 50 MG PO TABS   Oral   Take 50 mg by mouth every 6 (six) hours as needed. For pain           BP 195/62  Pulse 83  Temp 98.5 F (36.9 C) (Oral)  Resp 24  SpO2 97%  Physical Exam  Constitutional: [name] oriented to person, place, and time. He appears well-developed.  HENT:  Head: Normocephalic and atraumatic.  Mouth/Throat: Oropharynx is clear and moist. No oropharyngeal exudate.  Eyes: Pupils are equal, round, and reactive to light. Right eye exhibits no discharge. Left eye exhibits no discharge. Scleral icterus is present.  Neck: Normal range of motion. Neck supple.  Cardiovascular: Normal rate, regular rhythm and normal heart sounds.  Exam reveals no gallop and no friction rub.   No murmur heard. Pulmonary/Chest: Effort normal and breath sounds normal. No respiratory distress. He has no wheezes. He has no rales. He exhibits no tenderness.  Abdominal: Soft. Bowel sounds are normal. He exhibits no distension and no mass. There is tenderness in the right lower quadrant and epigastric area. There is no rigidity, no rebound, no guarding and no tenderness at McBurney's point.  Musculoskeletal: Normal range of motion.  Neurological: [name] alert and oriented to person, place, and time.  Skin: Skin is warm and dry.  Psychiatric: He has a normal mood and affect. His behavior is normal.    ED Course  Procedures (including critical care  time)  Labs Reviewed  CBC WITH DIFFERENTIAL - Abnormal; Notable for the following:    RBC 2.37 (*)     Hemoglobin 6.8 (*)     HCT 20.0 (*)     All  other components within normal limits  POCT I-STAT, CHEM 8 - Abnormal; Notable for the following:    Potassium 3.1 (*)     BUN 48 (*)     Creatinine, Ser 7.20 (*)     Glucose, Bld 131 (*)     Calcium, Ion 0.77 (*)     Hemoglobin 7.1 (*)     HCT 21.0 (*)     All other components within normal limits  BASIC METABOLIC PANEL  OCCULT BLOOD X 1 CARD TO LAB, STOOL  TYPE AND SCREEN  PROTIME-INR  APTT  PREPARE RBC (CROSSMATCH)   No results found.   No diagnosis found.   Date: 05/05/2012  Rate: 84  Rhythm: normal sinus rhythm  QRS Axis: normal  Intervals: normal  ST/T Wave abnormalities: normal  Conduction Disutrbances:none  Narrative Interpretation: LVH  Old EKG Reviewed: unchanged  Patient discussed with Dr. Toniann Fail who has agreed to admit the patient.   MDM  Patient presenting with a chief complaint of hematemesis and hematochezia.  He reports that he had rectal bleeding yesterday.  Large amount of bright red blood.  Patient also reports that today he had some blood in his emesis.  [name] currently not on any blood thinners.  Hemoccult positive for blood.  Hemoglobin dropped to 6.8, which is down from his from baseline.  Patient was therefore, given a blood transfusion with one unit PRBC.  Patient therefore, admitted to [facility] for further management of GI bleed.        Pascal Lux [place], PA-C 05/05/12 0120

## 2012-05-04 NOTE — H&P (Signed)
[name] is an 57 y.o. male.   Patient was seen and examined on May 04, 2012. PCP - follows with nephrologist. Chief Complaint: Low hemoglobin. HPI: 57 year old male with history of ESRD on hemodialysis on Tuesday Thursday and Saturday was found to have low hemoglobin today during dialysis. Patient states he has had at least 3 bloody bowel movement last night. Not able to exactly tell the quantity. In addition patient also has been having epigastric pain since yesterday. Denies any chest pain shortness of breath dizziness or loss of consciousness. In the ER patient's hemoglobin was found to be mildly decreased from his usual. Patient has been admitted for GI bleed. One unit of packed red blood cell transfusion has been already started. Looking back to patient's chart during the previous admission few months ago patient was found to have stool for occult blood positive. Patient states he has had a colonoscopy last year which was normal as per patient. Patient presently is hemodynamically stable and will be closely monitor in telemetry. Denies having used any NSAID's and is not on any blood thinners.  Past Medical History  Diagnosis Date  . Hypertension   . Diabetes mellitus without complication     on oral pills only  . ESRD (end stage renal disease) on dialysis since 2012  . Seizure disorder     questionable history of - will need to clarify with PCP  . Anemia, chronic disease   . CHF (congestive heart failure)     diastolic.  EF 60 - 65% per [facility] eco 11/2011  . Cocaine abuse     mentioned in notes from [place]  . Hepatitis C antibody test positive     was HIV negative, 02/28/12  . Hepatitis B core antibody positive     03/01/10  . Positive QuantiFERON-TB Gold test     11/2011  . Helicobacter pylori gastritis     not defined if this was treated  . Polyp of colon, adenomatous     May 2012.  Dr [name] in [place]  . Shortness of breath     Past Surgical History    Procedure Date  . Shoulder surgery   . Knee surgery   . Bascilic vein transposition 03/07/2012    Procedure: BASCILIC VEIN TRANSPOSITION;  Surgeon: Fransisco Hertz, MD;  Location: [facility] OR;  Service: Vascular;  Laterality: Left;  First Stage    Family History  Problem Relation Age of Onset  . Diabetes Mother   . Hypertension Mother   . Stroke Mother   . Kidney failure Mother    Social History:  reports that he has been smoking Cigarettes.  He has a 15 pack-year smoking history. He does not have any smokeless tobacco history on file. He reports that he drinks alcohol. He reports that he uses illicit drugs.  Allergies:  Allergies  Allergen Reactions  . Reglan (Metoclopramide) Other (See Comments)    Tardive dyskinesia in 11/2011 in [place]     (Not in a hospital admission)  Results for orders placed during the hospital encounter of 05/04/12 (from the past 48 hour(s))  CBC WITH DIFFERENTIAL     Status: Abnormal   Collection Time   05/04/12  5:02 PM      Component Value Range Comment   WBC 5.9  4.0 - 10.5 K/uL    RBC 2.37 (*) 4.22 - 5.81 MIL/uL    Hemoglobin 6.8 (*) 13.0 - 17.0 g/dL    HCT 62.1 (*) 30.8 - 52.0 %  MCV 84.4  78.0 - 100.0 fL    MCH 28.7  26.0 - 34.0 pg    MCHC 34.0  30.0 - 36.0 g/dL    RDW 47.8  29.5 - 62.1 %    Platelets 222  150 - 400 K/uL    Neutrophils Relative 71  43 - 77 %    Neutro Abs 4.2  1.7 - 7.7 K/uL    Lymphocytes Relative 16  12 - 46 %    Lymphs Abs 0.9  0.7 - 4.0 K/uL    Monocytes Relative 10  3 - 12 %    Monocytes Absolute 0.6  0.1 - 1.0 K/uL    Eosinophils Relative 3  0 - 5 %    Eosinophils Absolute 0.2  0.0 - 0.7 K/uL    Basophils Relative 1  0 - 1 %    Basophils Absolute 0.0  0.0 - 0.1 K/uL   BASIC METABOLIC PANEL     Status: Abnormal   Collection Time   05/04/12  5:02 PM      Component Value Range Comment   Sodium 140  135 - 145 mEq/L    Potassium 3.2 (*) 3.5 - 5.1 mEq/L    Chloride 97  96 - 112 mEq/L    CO2 27  19 - 32 mEq/L     Glucose, Bld 139 (*) 70 - 99 mg/dL    BUN 52 (*) 6 - 23 mg/dL    Creatinine, Ser 3.08 (*) 0.50 - 1.35 mg/dL    Calcium 6.7 (*) 8.4 - 10.5 mg/dL    GFR calc non Af Amer 7 (*) >90 mL/min    GFR calc Af Amer 8 (*) >90 mL/min   PROTIME-INR     Status: Normal   Collection Time   05/04/12  5:30 PM      Component Value Range Comment   Prothrombin Time 13.6  11.6 - 15.2 seconds    INR 1.05  0.00 - 1.49   APTT     Status: Abnormal   Collection Time   05/04/12  5:30 PM      Component Value Range Comment   aPTT 40 (*) 24 - 37 seconds   TYPE AND SCREEN     Status: Normal (Preliminary result)   Collection Time   05/04/12  5:35 PM      Component Value Range Comment   ABO/RH(D) A POS      Antibody Screen NEG      Sample Expiration 05/07/2012      Unit Number [id number]      Blood Component Type RED CELLS,LR      Unit division 00      Status of Unit ISSUED      Transfusion Status OK TO TRANSFUSE      Crossmatch Result Compatible     PREPARE RBC (CROSSMATCH)     Status: Normal   Collection Time   05/04/12  5:35 PM      Component Value Range Comment   Order Confirmation ORDER PROCESSED BY BLOOD BANK     POCT I-STAT, CHEM 8     Status: Abnormal   Collection Time   05/04/12  5:46 PM      Component Value Range Comment   Sodium 142  135 - 145 mEq/L    Potassium 3.1 (*) 3.5 - 5.1 mEq/L    Chloride 98  96 - 112 mEq/L    BUN 48 (*) 6 - 23 mg/dL    Creatinine, Ser  7.20 (*) 0.50 - 1.35 mg/dL    Glucose, Bld 161 (*) 70 - 99 mg/dL    Calcium, Ion 0.96 (*) 1.12 - 1.23 mmol/L    TCO2 28  0 - 100 mmol/L    Hemoglobin 7.1 (*) 13.0 - 17.0 g/dL    HCT 04.5 (*) 40.9 - 52.0 %   GLUCOSE, CAPILLARY     Status: Abnormal   Collection Time   05/04/12  5:51 PM      Component Value Range Comment   Glucose-Capillary 122 (*) 70 - 99 mg/dL    Comment 1 Documented in Chart      Comment 2 Notify RN      No results found.  Review of Systems  Constitutional: Negative.   HENT: Negative.   Eyes: Negative.     Respiratory: Negative.   Cardiovascular: Negative.   Gastrointestinal: Positive for blood in stool.  Genitourinary: Negative.   Musculoskeletal: Negative.   Skin: Negative.   Neurological: Negative.   Endo/Heme/Allergies: Negative.   Psychiatric/Behavioral: Negative.     Blood pressure 176/65, pulse 82, temperature 98.2 F (36.8 C), temperature source Oral, resp. rate 21, SpO2 99.00%. Physical Exam  Constitutional: [name] oriented to person, place, and time. He appears well-developed and well-nourished. No distress.  HENT:  Head: Normocephalic and atraumatic.  Right Ear: External ear normal.  Left Ear: External ear normal.  Nose: Nose normal.  Mouth/Throat: Oropharynx is clear and moist. No oropharyngeal exudate.  Eyes: Conjunctivae normal are normal. Pupils are equal, round, and reactive to light. Right eye exhibits no discharge. Left eye exhibits no discharge. No scleral icterus.  Neck: Normal range of motion. Neck supple.  Cardiovascular: Normal rate and regular rhythm.   Respiratory: Effort normal and breath sounds normal. No respiratory distress. He has no wheezes. He has no rales.  GI: Soft. Bowel sounds are normal. He exhibits no distension. There is no tenderness. There is no rebound and no guarding.  Musculoskeletal: He exhibits no edema and no tenderness.  Neurological: [name] alert and oriented to person, place, and time.       Moves all extremities.  Skin: Skin is warm and dry. [name] not diaphoretic.     Assessment/Plan #1. GI bleed - not sure if it is upper or lower. Given the epigastric pain patient has been started on Protonix. Keep patient n.p.o. Recheck CBC after transfusion. Check INR. Consult GI in a.m. Since patient had complained of epigastric pain which could be from gastritis will also check lipase. #2. ESRD on hemodialysis - On Tuesday Thursday and Saturday - consult nephrology in a.m. #3. On prophylactic Keppra after head injury - will give Keppra IV until  patient can take by mouth. #4. Hypertension - since patient is n.p.o. patient will be on when necessary IV hydralazine for systolic blood pressure more than 180. #5. Cigarette smoking and cocaine abuse - advised to quit these habits. #6. Diabetes on diet control - follow CBG.  CODE STATUS - full code.   KAKRAKANDY,ARSHAD N. 05/04/2012, 8:25 PM

## 2012-05-04 NOTE — ED Notes (Signed)
Dinner tray ordered for pt

## 2012-05-04 NOTE — ED Notes (Addendum)
Occult blood stool sample - POSITIVE

## 2012-05-04 NOTE — ED Notes (Signed)
CSW provided ED with meal voucher for pt per [facility] request.

## 2012-05-05 DIAGNOSIS — D539 Nutritional anemia, unspecified: Secondary | ICD-10-CM

## 2012-05-05 DIAGNOSIS — K625 Hemorrhage of anus and rectum: Secondary | ICD-10-CM

## 2012-05-05 DIAGNOSIS — E119 Type 2 diabetes mellitus without complications: Secondary | ICD-10-CM

## 2012-05-05 LAB — COMPREHENSIVE METABOLIC PANEL
AST: 21 U/L (ref 0–37)
Albumin: 2.7 g/dL — ABNORMAL LOW (ref 3.5–5.2)
Calcium: 6.2 mg/dL — CL (ref 8.4–10.5)
Creatinine, Ser: 8.97 mg/dL — ABNORMAL HIGH (ref 0.50–1.35)

## 2012-05-05 LAB — GLUCOSE, CAPILLARY
Glucose-Capillary: 105 mg/dL — ABNORMAL HIGH (ref 70–99)
Glucose-Capillary: 102 mg/dL — ABNORMAL HIGH (ref 70–99)

## 2012-05-05 LAB — CBC
MCH: 29.1 pg (ref 26.0–34.0)
MCHC: 33.5 g/dL (ref 30.0–36.0)
MCV: 87 fL (ref 78.0–100.0)
Platelets: 215 10*3/uL (ref 150–400)
RDW: 15.1 % (ref 11.5–15.5)

## 2012-05-05 LAB — LIPASE, BLOOD: Lipase: 61 U/L — ABNORMAL HIGH (ref 11–59)

## 2012-05-05 LAB — PROTIME-INR: INR: 1.16 (ref 0.00–1.49)

## 2012-05-05 LAB — HEMOGLOBIN AND HEMATOCRIT, BLOOD: HCT: 22 % — ABNORMAL LOW (ref 39.0–52.0)

## 2012-05-05 MED ORDER — LEVETIRACETAM 500 MG PO TABS
1000.0000 mg | ORAL_TABLET | Freq: Two times a day (BID) | ORAL | Status: DC
  Administered 2012-05-05 – 2012-05-07 (×5): 1000 mg via ORAL
  Filled 2012-05-05 (×6): qty 2

## 2012-05-05 MED ORDER — PANTOPRAZOLE SODIUM 40 MG PO TBEC
40.0000 mg | DELAYED_RELEASE_TABLET | Freq: Every day | ORAL | Status: DC
  Administered 2012-05-05 – 2012-05-06 (×2): 40 mg via ORAL
  Filled 2012-05-05 (×3): qty 1

## 2012-05-05 MED ORDER — CALCIUM CARBONATE ANTACID 500 MG PO CHEW
2.0000 | CHEWABLE_TABLET | Freq: Three times a day (TID) | ORAL | Status: DC
  Administered 2012-05-05 – 2012-05-07 (×7): 400 mg via ORAL
  Filled 2012-05-05 (×8): qty 2

## 2012-05-05 MED ORDER — VITAMIN B-1 100 MG PO TABS
100.0000 mg | ORAL_TABLET | Freq: Every day | ORAL | Status: DC
  Administered 2012-05-05: 100 mg via ORAL
  Filled 2012-05-05: qty 1

## 2012-05-05 MED ORDER — RENA-VITE PO TABS
1.0000 | ORAL_TABLET | Freq: Every day | ORAL | Status: DC
  Administered 2012-05-05 – 2012-05-06 (×2): 1 via ORAL
  Filled 2012-05-05 (×3): qty 1

## 2012-05-05 MED ORDER — TRAMADOL HCL 50 MG PO TABS
50.0000 mg | ORAL_TABLET | Freq: Four times a day (QID) | ORAL | Status: DC | PRN
  Filled 2012-05-05: qty 1

## 2012-05-05 MED ORDER — CALCIUM CARBONATE ANTACID 500 MG PO CHEW: 400.0000 mg | CHEWABLE_TABLET | Freq: Three times a day (TID) | ORAL | Status: DC

## 2012-05-05 MED ORDER — FOLIC ACID 1 MG PO TABS
1.0000 mg | ORAL_TABLET | Freq: Every day | ORAL | Status: DC
  Administered 2012-05-05: 1 mg via ORAL
  Filled 2012-05-05: qty 1

## 2012-05-05 MED ORDER — BOOST / RESOURCE BREEZE PO LIQD
1.0000 | Freq: Three times a day (TID) | ORAL | Status: DC
  Administered 2012-05-05 – 2012-05-07 (×3): 1 via ORAL

## 2012-05-05 NOTE — Progress Notes (Signed)
CRITICAL VALUE ALERT  Critical value received:  Calcium=6.2  Date of notification:  05/05/12  Time of notification:  0412  Critical value read back:yes  Nurse who received alert:  Minna Merritts  MD notified (1st page):  Donnamarie Poag  Time of first page:  [phone]

## 2012-05-05 NOTE — Consult Note (Signed)
[facility] Renal Consultation Note    Indication for Consultation:  Management of ESRD/hemodialysis; anemia, hypertension/volume and secondary hyperparathyroidism  HPI: [name] is a 57 y.o. male ESRD patient (TTS HD) with a history of HTN, DM,  And diastolic HF who was found to have hgb's in the 6.2-6.5 range at OP HD on 05/04/12. The night preceding, he endorsed having epigastric pain and bloody bowel movements x3 but was unable to quantify. He presented to the [facility] ED where he received 1 unit PRBCs  and was admitted for further workup of GIB.    Mr. Jovel has frequent admissions to [facility] for normocytic anemia requiring multiple transfusions. There was no source of bleeding found on EGC, capsule or colonoscopy. Hematology was consulted and they suspected BM process but pt refused biopsy.  Of note, he has missed several HD treatments this month already with an early sign-off on 1/16 with 1.5 hours remaining. He endorses non-compliance with his home medications, stating that he had no one to refill them.  He denies any further BRBPR since the initial episode.  He denies SOB, dizziness, nausea, vomiting or diarrhea.    Past Medical History  Diagnosis Date  . Hypertension   . Diabetes mellitus without complication     on oral pills only  . ESRD (end stage renal disease) on dialysis since 2012  . Seizure disorder     questionable history of - will need to clarify with PCP  . Anemia, chronic disease   . CHF (congestive heart failure)     diastolic.  EF 60 - 65% per [facility] eco 11/2011  . Cocaine abuse     mentioned in notes from [place]  . Hepatitis C antibody test positive     was HIV negative, 02/28/12  . Hepatitis B core antibody positive     03/01/10  . Positive QuantiFERON-TB Gold test     11/2011  . Helicobacter pylori gastritis     not defined if this was treated  . Polyp of colon, adenomatous     May 2012.  Dr [name] in [place]    . Shortness of breath    Past Surgical History  Procedure Date  . Shoulder surgery   . Knee surgery   . Bascilic vein transposition 03/07/2012    Procedure: BASCILIC VEIN TRANSPOSITION;  Surgeon: Fransisco Hertz, MD;  Location: [facility] OR;  Service: Vascular;  Laterality: Left;  First Stage   Family History  Problem Relation Age of Onset  . Diabetes Mother   . Hypertension Mother   . Stroke Mother   . Kidney failure Mother    Social History:  reports that he has been smoking Cigarettes.  He has a 15 pack-year smoking history. He does not have any smokeless tobacco history on file. He reports that he drinks alcohol. He reports that he uses illicit drugs. Allergies  Allergen Reactions  . Reglan (Metoclopramide) Other (See Comments)    Tardive dyskinesia in 11/2011 in [place]   Prior to Admission medications   Medication Sig Start Date End Date Taking? Authorizing Provider  benazepril (LOTENSIN) 10 MG tablet Take 1 tablet (10 mg total) by mouth daily. 03/08/12  Yes Manuela Schwartz, MD  folic acid (FOLVITE) 1 MG tablet Take 1 tablet (1 mg total) by mouth daily. 03/08/12  Yes Manuela Schwartz, MD  hydrALAZINE (APRESOLINE) 50 MG tablet Take 1 tablet (50 mg total) by mouth 2 (two) times daily. 03/08/12  Yes Eston Esters  Bosie Clos, MD  levETIRAcetam (KEPPRA) 1000 MG tablet Take 1 tablet (1,000 mg total) by mouth 2 (two) times daily. 03/08/12  Yes Manuela Schwartz, MD  metoprolol (LOPRESSOR) 50 MG tablet Take 1 tablet (50 mg total) by mouth 2 (two) times daily. 03/08/12  Yes Manuela Schwartz, MD  pantoprazole (PROTONIX) 40 MG tablet Take 1 tablet (40 mg total) by mouth at bedtime. 03/08/12  Yes Manuela Schwartz, MD  thiamine 100 MG tablet Take 1 tablet (100 mg total) by mouth daily. 03/08/12  Yes Manuela Schwartz, MD  traMADol (ULTRAM) 50 MG tablet Take 50 mg by mouth every 6 (six) hours as needed. For pain 03/08/12  Yes Manuela Schwartz, MD    Current Facility-Administered Medications  Medication Dose Route Frequency Provider Last Rate Last Dose  . acetaminophen (TYLENOL) tablet 650 mg  650 mg Oral Q6H PRN Eduard Clos, MD       Or  . acetaminophen (TYLENOL) suppository 650 mg  650 mg Rectal Q6H PRN Eduard Clos, MD      . folic acid (FOLVITE) tablet 1 mg  1 mg Oral Daily Sorin Luanne Bras, MD   1 mg at 05/05/12 1102  . hydrALAZINE (APRESOLINE) injection 10 mg  10 mg Intravenous Q4H PRN Eduard Clos, MD      . levETIRAcetam (KEPPRA) tablet 1,000 mg  1,000 mg Oral BID Sorin Luanne Bras, MD   1,000 mg at 05/05/12 1102  . ondansetron (ZOFRAN) tablet 4 mg  4 mg Oral Q6H PRN Eduard Clos, MD       Or  . ondansetron [facility]) injection 4 mg  4 mg Intravenous Q6H PRN Eduard Clos, MD      . pantoprazole (PROTONIX) EC tablet 40 mg  40 mg Oral QHS Sorin C Laza, MD      . sodium chloride 0.9 % injection 3 mL  3 mL Intravenous Q12H Eduard Clos, MD   3 mL at 05/05/12 1103  . thiamine (VITAMIN B-1) tablet 100 mg  100 mg Oral Daily Sorin Luanne Bras, MD   100 mg at 05/05/12 1102  . traMADol (ULTRAM) tablet 50 mg  50 mg Oral Q6H PRN Sorin Luanne Bras, MD       Labs: Basic Metabolic Panel:  Lab 05/05/12 1610 05/04/12 1746 05/04/12 1702  NA 142 142 140  K 3.5 3.1* 3.2*  CL 100 98 97  CO2 27 -- 27  GLUCOSE 136* 131* 139*  BUN 56* 48* 52*  CREATININE 8.97* 7.20* 7.58*  CALCIUM 6.2* -- 6.7*  ALB -- -- --  PHOS -- -- --   Liver Function Tests:  Lab 05/05/12 0147  AST 21  ALT 15  ALKPHOS 53  BILITOT 0.2*  PROT 6.7  ALBUMIN 2.7*    Lab 05/05/12 0147  LIPASE 61*  AMYLASE --   CBC:  Lab 05/05/12 1004 05/05/12 0147 05/04/12 1746 05/04/12 1702  WBC -- 5.0 -- 5.9  NEUTROABS -- -- -- 4.2  HGB 7.3* 7.4* 7.1* --  HCT 22.0* 22.1* 21.0* --  MCV -- 87.0 -- 84.4  PLT -- 215 -- 222    CBG:  Lab 05/05/12 1201 05/05/12 0802 05/05/12 0525 05/04/12 2244 05/04/12 1751  GLUCAP 187* 100* 105* 124* 122*    Studies/Results: No results found.  ROS:   Physical Exam: Filed Vitals:   05/04/12 2130 05/04/12 2238 05/05/12 0522 05/05/12 0910  BP: 182/69 167/81 165/76 154/61  Pulse: 81 81 73 77  Temp:  98.8 F (37.1 C) 98.6 F (37 C) 97.4 F (36.3 C)  TempSrc:  Oral Oral Oral  Resp: 16 16 16 16   Height:  5\' 10"  (1.778 m)    Weight:  64 kg (141 lb 1.5 oz)    SpO2: 98% 97% 99% 100%     General: Well developed, well nourished, in no acute distress. Head: Normocephalic, atraumatic, sclera non-icteric, mucus membranes are moist Neck: Supple. JVD not elevated. Lungs: Clear bilaterally to auscultation without wheezes, rales, or rhonchi. Breathing is unlabored. Heart: RRR with S1 S2. No murmurs, rubs, or gallops appreciated. Abdomen: Soft, non-tender, non-distended with normoactive bowel sounds. No rebound/guarding. No obvious abdominal masses. M-S:  Strength and tone appear normal for age. Lower extremities:without edema or ischemic changes, no open wounds  Neuro: Alert and oriented X 3. Moves all extremities spontaneously. Psych:  Responds to questions appropriately with a normal affect. Dialysis Access: Rt I-J/ 1st stage left BVT AVF (03/07/12 by Dr Imogene Burn)  Dialysis Orders: Center: AF  on TTS . EDW 64 kg HD Bath 2K+/ 2.25 Ca  Time 4:00 Heparin 1500. Access Rt I-J BFR 400 DFR A1.5    Hectorol 1 mcg IV/HD Epogen 20,000   Units IV/HD  Venofer  100 mg IV TIW  Several missed tx's this month. Early sign-off 1/16 with 1.5 hrs remaining  Assessment/Plan: 1. Recurrent Acute normocytic anemia - Hb of 6.8. Was transfused on 05/04/12 . Hgb now 7.3. Prior records indicate frequent admissions to [facility] for normocytic anemia requiring multiple transfusions.No clear etiology from those records, as patient was FOBT + but no source of bleeding on EGD, capsule, or colonoscopy performed in [place], as recent as December 2012. Does have h/o H pylori. GI consult pending. Per primary 2. ESRD  -  TTS @ [facility].  K+ 3.5. BUN 56 3.  Hypertension/volume  - SBPs 180s upon admission, now improving on hydralazine 10 mg IV Q4 PRN. Questionable noncompliance with home meds. No evidence of volume excess. Pt is at EDW - will challenge 4.  Anemia  - Hgb now 7.3 s/p transfusion of 1 unit.   Baseline hgb is ~8. Will transfuse an additional unit on HD. Tomorrow. Ferritin 266. Tsat 21% . Will order Aranesp 150. 5.  Metabolic bone disease -  Ca corrected is 7.2/ Last Phos 7. PTH 339.8. Continue Hectorol 1. Will order a binder and 2.50Ca bath. 6.  Nutrition - Alb 2.7. High protein renal diet. Add Renal vitamin and Breeze 7. DM - Controlled on oral medications 8. Hx Diastolic HF - EF 16-10% in August 2013 per records 9.  Hx of Seizure - On prophylactic Keppra s/p head injury 10. Hepatitis B and C  Claud Kelp, PA-C [facility] [phone] pager 05/05/2012, 1:44 PM   Patient seen and examined and above reviewed.  Agree with assessment and plan as above. Vinson Moselle  MD [employer] [employer] [phone] pgr    [phone] cell 05/05/2012, 5:00 PM

## 2012-05-05 NOTE — Progress Notes (Signed)
[facility] PROGRESS NOTE  [name] ZOX:[id number] DOB: [birth year] DOA: 05/04/2012 PCP: No primary provider on file.  Assessment/Plan: 1. Recurrent Acute normocytic anemia  Patient presented with Hb of 6.8. Was transfused on 05/04/12 . Prior records indicate frequent admissions to [facility] for normocytic anemia requiring multiple transfusions in the past. No clear etiology from those records, as patient was FOBT + but no source of bleeding on EGD, capsule, or colonoscopy performed in [place], as recent as December 2012. Does have h/o H pylori. Will monitor for evidence of acute blood loss during this hospitalization. Prior hematologic workup at [facility] in 02/2012 revealed elevated LDH but normal haptoglobin. Low retic ct. High ferritin and TIBC. Direct antibody negative. 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 Dr. Truett Perna with hematology consulted and evaluated patient. Suspect BM process in light of low retic count and no evidence of active hemolysis. Patient refused BM biopsy.  Baseline Hb around 8.  2. ESRD - renal consult \ 3. Reported hematochezia - GI consultation and monitor closely.  4. Non compliance - [name]....   Code Status: full Family Communication: patient  Disposition Plan: home   Consultants:  GI   Renal  Procedures:    Antibiotics:   (indicate start date, and stop date if known)  HPI/Subjective: Wants food   Objective: Filed Vitals:   05/04/12 2100 05/04/12 2130 05/04/12 2238 05/05/12 0522  BP: 175/69 182/69 167/81 165/76  Pulse: 80 81 81 73  Temp:   98.8 F (37.1 C) 98.6 F (37 C)  TempSrc:   Oral Oral  Resp: 15 16 16 16   Height:   5\' 10"  (1.778 m)   Weight:   64 kg (141 lb 1.5 oz)   SpO2: 99% 98% 97% 99%    Intake/Output Summary (Last 24 hours) at 05/05/12 0743 Last data filed at 05/05/12 0526  Gross per 24 hour  Intake 1152.5 ml  Output      0  ml  Net 1152.5 ml   Filed Weights   05/04/12 2238  Weight: 64 kg (141 lb 1.5 oz)    Exam:   General:  axox3  Cardiovascular: rrr  Respiratory: ctab  Abdomen: soft, nt   Data Reviewed: Basic Metabolic Panel:  Lab 05/05/12 8119 05/04/12 1746 05/04/12 1702  NA 142 142 140  K 3.5 3.1* 3.2*  CL 100 98 97  CO2 27 -- 27  GLUCOSE 136* 131* 139*  BUN 56* 48* 52*  CREATININE 8.97* 7.20* 7.58*  CALCIUM 6.2* -- 6.7*  MG -- -- --  PHOS -- -- --   Liver Function Tests:  Lab 05/05/12 0147  AST 21  ALT 15  ALKPHOS 53  BILITOT 0.2*  PROT 6.7  ALBUMIN 2.7*    Lab 05/05/12 0147  LIPASE 61*  AMYLASE --   No results found for this basename: AMMONIA:5 in the last 168 hours CBC:  Lab 05/05/12 0147 05/04/12 1746 05/04/12 1702  WBC 5.0 -- 5.9  NEUTROABS -- -- 4.2  HGB 7.4* 7.1* 6.8*  HCT 22.1* 21.0* 20.0*  MCV 87.0 -- 84.4  PLT 215 -- 222   Cardiac Enzymes: No results found for this basename: CKTOTAL:5,CKMB:5,CKMBINDEX:5,TROPONINI:5 in the last 168 hours BNP (last 3 results) No results found for this basename: PROBNP:3 in the last 8760 hours CBG:  Lab 05/05/12 0525 05/04/12 2244 05/04/12 1751  GLUCAP 105* 124* 122*    No results found for  this or any previous visit (from the past 240 hour(s)).   Studies: No results found.  Scheduled Meds:    . levetiracetam  1,000 mg Intravenous Q12H  . pantoprazole (PROTONIX) IV  40 mg Intravenous Q12H  . sodium chloride  3 mL Intravenous Q12H   Continuous Infusions:   Principal Problem:  *GI bleed Active Problems:  Hypertension  Diabetes mellitus without complication  ESRD (end stage renal disease) on dialysis     [name]  [facility] Pager [phone]. If 8PM-8AM, please contact night-coverage at [email or web address], password [facility] 05/05/2012, 7:43 AM  LOS: 1 day

## 2012-05-05 NOTE — ED Provider Notes (Signed)
Medical screening examination/treatment/procedure(s) were performed by non-physician practitioner and as supervising physician I was immediately available for consultation/collaboration.   Gwyneth Sprout, MD 05/05/12 [phone]

## 2012-05-05 NOTE — Consult Note (Signed)
[facility] Consultation  Referring Provider:  [facility]  Primary Care Physician:  No primary provider on file. Primary Gastroenterologist:       Reason for Consultation:          HPI: [name] is a 57 y.o. male with ESRD on HD. Patient from [place] but relocated here a few months ago. Patient was hospitalized in November with malaise and fever. At that time patient had just relocated to [facility], made no plans for dialysis and had run out of his medications a week prior. Of note, he tested positive for cocaine that admission. We saw patient during his November admission for evaluation of heme positive stools / chronic anemia. We were able to obtain previous GI records / procedures (see below). Patient's discharge hgb was 7.7.   Patient admitted yesterday after found to have low hemoglobin at dialysis. Admitting hgb 6.8, now 7.3 after a unit of blood. On admission patient complained of epigastric pain and reported passage of at least 3 bloody BMs night before admission. Patient describes rectal bleeding to me as being only ONE episode of small volume blood. He denies constipation. Denies hemorrhoids. He refused rectal exam, "they did one in ED".    Past Medical History  Diagnosis Date  . Hypertension   . Diabetes mellitus without complication     on oral pills only  . ESRD (end stage renal disease) on dialysis since 2012  . Seizure disorder     questionable history of - will need to clarify with PCP  . Anemia, chronic disease   . CHF (congestive heart failure)     diastolic.  EF 60 - 65% per [facility] eco 11/2011  . Cocaine abuse     mentioned in notes from [place]  . Hepatitis C antibody test positive     was HIV negative, 02/28/12  . Hepatitis B core antibody positive     03/01/10  . Positive QuantiFERON-TB Gold test     11/2011  . Helicobacter pylori gastritis     not defined if this was treated  . Polyp of colon, adenomatous     May 2012.  Dr [name] in  [place]  . Shortness of breath     Past Surgical History  Procedure Date  . Shoulder surgery   . Knee surgery   . Bascilic vein transposition 03/07/2012    Procedure: BASCILIC VEIN TRANSPOSITION;  Surgeon: Fransisco Hertz, MD;  Location: [facility] OR;  Service: Vascular;  Laterality: Left;  First Stage    Family History  Problem Relation Age of Onset  . Diabetes Mother   . Hypertension Mother   . Stroke Mother   . Kidney failure Mother     History  Substance Use Topics  . Smoking status: Current Every Day Smoker -- 1.0 packs/day for 15 years    Types: Cigarettes  . Smokeless tobacco: Not on file  . Alcohol Use: Yes     Comment: pt does not remember the last time he drank.    Prior to Admission medications   Medication Sig Start Date End Date Taking? Authorizing Provider  benazepril (LOTENSIN) 10 MG tablet Take 1 tablet (10 mg total) by mouth daily. 03/08/12  Yes Manuela Schwartz, MD  folic acid (FOLVITE) 1 MG tablet Take 1 tablet (1 mg total) by mouth daily. 03/08/12  Yes Manuela Schwartz, MD  hydrALAZINE (APRESOLINE) 50 MG tablet Take 1 tablet (50 mg total) by mouth 2 (two) times daily. 03/08/12  Yes  Manuela Schwartz, MD  levETIRAcetam (KEPPRA) 1000 MG tablet Take 1 tablet (1,000 mg total) by mouth 2 (two) times daily. 03/08/12  Yes Manuela Schwartz, MD  metoprolol (LOPRESSOR) 50 MG tablet Take 1 tablet (50 mg total) by mouth 2 (two) times daily. 03/08/12  Yes Manuela Schwartz, MD  pantoprazole (PROTONIX) 40 MG tablet Take 1 tablet (40 mg total) by mouth at bedtime. 03/08/12  Yes Manuela Schwartz, MD  thiamine 100 MG tablet Take 1 tablet (100 mg total) by mouth daily. 03/08/12  Yes Manuela Schwartz, MD  traMADol (ULTRAM) 50 MG tablet Take 50 mg by mouth every 6 (six) hours as needed. For pain 03/08/12  Yes Manuela Schwartz, MD    Current Facility-Administered Medications  Medication Dose Route Frequency Provider Last Rate Last  Dose  . acetaminophen (TYLENOL) tablet 650 mg  650 mg Oral Q6H PRN Eduard Clos, MD       Or  . acetaminophen (TYLENOL) suppository 650 mg  650 mg Rectal Q6H PRN Eduard Clos, MD      . calcium carbonate (TUMS - dosed in mg elemental calcium) chewable tablet 400 mg of elemental calcium  2 tablet Oral TID WC Kerin Salen, PA-C      . feeding supplement (RESOURCE BREEZE) liquid 1 Container  1 Container Oral TID BM Kerin Salen, PA-C      . hydrALAZINE (APRESOLINE) injection 10 mg  10 mg Intravenous Q4H PRN Eduard Clos, MD      . levETIRAcetam (KEPPRA) tablet 1,000 mg  1,000 mg Oral BID Sorin Luanne Bras, MD   1,000 mg at 05/05/12 1102  . multivitamin (RENA-VIT) tablet 1 tablet  1 tablet Oral QHS Kerin Salen, PA-C      . ondansetron [facility]) tablet 4 mg  4 mg Oral Q6H PRN Eduard Clos, MD       Or  . ondansetron [facility]) injection 4 mg  4 mg Intravenous Q6H PRN Eduard Clos, MD      . pantoprazole (PROTONIX) EC tablet 40 mg  40 mg Oral QHS Sorin C Laza, MD      . sodium chloride 0.9 % injection 3 mL  3 mL Intravenous Q12H Eduard Clos, MD   3 mL at 05/05/12 1103  . traMADol (ULTRAM) tablet 50 mg  50 mg Oral Q6H PRN Sorin Luanne Bras, MD        Allergies as of 05/04/2012 - Review Complete 05/04/2012  Allergen Reaction Noted  . Reglan (metoclopramide) Other (See Comments) 03/02/2012     Review of Systems:    All systems reviewed and negative except where noted in HPI.   PHYSICAL EXAM: Vital signs in last 24 hours: Temp:  [97.4 F (36.3 C)-99.4 F (37.4 C)] 98.1 F (36.7 C) (01/17 1453) Pulse Rate:  [73-85] 81  (01/17 1453) Resp:  [14-27] 16  (01/17 1453) BP: (154-208)/(53-81) 157/53 mmHg (01/17 1453) SpO2:  [88 %-100 %] 100 % (01/17 1453) Weight:  [141 lb 1.5 oz (64 kg)] 141 lb 1.5 oz (64 kg) (01/16 2238) Last BM Date: 05/03/12 General:   black male in NAD Head:  Normocephalic and atraumatic. Eyes:   No icterus.   Conjunctiva pink. Ears:   Normal auditory acuity. Neck:  Supple; no masses felt Lungs:  Respirations even and unlabored. Lungs clear to auscultation bilaterally.   No wheezes, crackles, or rhonchi.  Heart:  Regular rate and rhythm;  murmur heard. Abdomen:  Soft, nondistended,  nontender. Normal bowel sounds. No appreciable masses or hepatomegaly.  Rectal:  Not performed. Refused Msk:  Symmetrical without gross deformities.  Extremities:  Without edema. Neurologic:  Alert and  oriented x4;  grossly normal neurologically. Skin:  Intact without significant lesions or rashes. Cervical Nodes:  No significant cervical adenopathy. Psych:  Alert and cooperative. Normal affect.  LAB RESULTS:  Basename 05/05/12 1004 05/05/12 0147 05/04/12 1746 05/04/12 1702  WBC -- 5.0 -- 5.9  HGB 7.3* 7.4* 7.1* --  HCT 22.0* 22.1* 21.0* --  PLT -- 215 -- 222   BMET  Basename 05/05/12 0147 05/04/12 1746 05/04/12 1702  NA 142 142 140  K 3.5 3.1* 3.2*  CL 100 98 97  CO2 27 -- 27  GLUCOSE 136* 131* 139*  BUN 56* 48* 52*  CREATININE 8.97* 7.20* 7.58*  CALCIUM 6.2* -- 6.7*   LFT  Basename 05/05/12 0147  PROT 6.7  ALBUMIN 2.7*  AST 21  ALT 15  ALKPHOS 53  BILITOT 0.2*  BILIDIR --  IBILI --   PT/INR  Basename 05/05/12 0147 05/04/12 1730  LABPROT 14.6 13.6  INR 1.16 1.05    PREVIOUS ENDOSCOPIES: Enteroscopy 03/26/2011 Dr Samuella Cota. Negative study. He refused colonoscopy. Capsule endo entertained  EGD May 2012: Small HH,  EGD 2011: gastritis  Colonoscopy May 2012: 6mm adenomatous sigmoid polyp    IMPRESSION / PLAN: 1.  Rectal bleeding. Discrepancy in what patient reported on admission and what he tells me. Patient reports one episode of painless bleeding (low volume) a couple of nights ago. He reported at least 3 when admitted. Patient refused rectal exam but denies hemorrhoids. No ongoing bleeding but patient's hgb is down nearly a gram from last discharge. He has had a colonoscopy within the last two years so colon  neoplasm not likely. No plans to repeat a colonoscopy at this point. No GI workup planned.   2.  HCV per PMH  3. History of illicit drug use, cocaine and marijuana.   4. ESRD, on HD  5. Positive quantiferon gold test 8/13. Did he get follow up for this?    Thanks   LOS: 1 day   Willette Cluster  05/05/2012, 3:16 PM  Chart was reviewed and patient was examined. X-rays and lab were reviewed.    I agree with management and plans. Patient relates to me that he has had little light colored rectal bleeding on only 1 occasion.  i suspect he has had hemorrhoidal bleeding and would treat as such.  Anemia is multifacorial.  Recommend anusol suppositories.  Hold repeat GI workup.   Barbette Hair. [name], M.D., [facility] Cell [phone]

## 2012-05-06 ENCOUNTER — Inpatient Hospital Stay (HOSPITAL_COMMUNITY): Payer: Medicare Other

## 2012-05-06 DIAGNOSIS — K625 Hemorrhage of anus and rectum: Secondary | ICD-10-CM

## 2012-05-06 DIAGNOSIS — K922 Gastrointestinal hemorrhage, unspecified: Secondary | ICD-10-CM

## 2012-05-06 LAB — RENAL FUNCTION PANEL
BUN: 84 mg/dL — ABNORMAL HIGH (ref 6–23)
CO2: 24 mEq/L (ref 19–32)
Calcium: 5.5 mg/dL — CL (ref 8.4–10.5)
Creatinine, Ser: 11.25 mg/dL — ABNORMAL HIGH (ref 0.50–1.35)
Glucose, Bld: 147 mg/dL — ABNORMAL HIGH (ref 70–99)

## 2012-05-06 LAB — CBC
Hemoglobin: 6.6 g/dL — CL (ref 13.0–17.0)
MCH: 29.9 pg (ref 26.0–34.0)
MCV: 88.2 fL (ref 78.0–100.0)
RBC: 2.21 MIL/uL — ABNORMAL LOW (ref 4.22–5.81)

## 2012-05-06 LAB — GLUCOSE, CAPILLARY
Glucose-Capillary: 167 mg/dL — ABNORMAL HIGH (ref 70–99)
Glucose-Capillary: 146 mg/dL — ABNORMAL HIGH (ref 70–99)
Glucose-Capillary: 139 mg/dL — ABNORMAL HIGH (ref 70–99)

## 2012-05-06 NOTE — Progress Notes (Signed)
CRITICAL VALUE ALERT  Critical value received:  hgb 6.6 and calcium level 5.5   Date of notification:  05/06/2012  Time of notification:  1050  Critical value read back:yes  Nurse who received alert:  Haynes Dage, RN  MD notified (1st page):  Dr. Arlean Hopping  Time of first page:  1050  MD notified (2nd page):  Time of second page:  Responding MD:  Lenny Pastel, [facility]  Time MD responded:  1050

## 2012-05-06 NOTE — Procedures (Signed)
I was present at this dialysis session. I have reviewed the session itself and made appropriate changes.   Vinson Moselle, MD [employer] 05/06/2012, 12:21 PM

## 2012-05-06 NOTE — Progress Notes (Signed)
No further rectal bleeding.  Anemia is multifactorial.  Doubt he is active GI bleeding.  Would hold further GI workup and f/u with Dr. Samuella Cota (GI) as outpt.  Signing off.

## 2012-05-06 NOTE — Progress Notes (Signed)
hgb dropped from 7.4 to 6.6, orders already existed to transfuse 1 unit PRBCs. DStark Klein, PAC ordered another unit, and 2k 3.5 ca to increased calcium level.

## 2012-05-06 NOTE — Progress Notes (Signed)
Subjective: sleeping  On hd, awoken no cos now Objective Vital signs in last 24 hours: Filed Vitals:   05/06/12 0847 05/06/12 0900 05/06/12 0930 05/06/12 1000  BP: 166/66 153/61 139/46 148/56  Pulse: 75 77 77 77  Temp:      TempSrc:      Resp: 17 16 26 18   Height:      Weight:      SpO2:       Weight change: 3.3 kg (7 lb 4.4 oz)  Intake/Output Summary (Last 24 hours) at 05/06/12 1022 Last data filed at 05/06/12 0600  Gross per 24 hour  Intake    600 ml  Output      0 ml  Net    600 ml   Labs: Basic Metabolic Panel:  Lab 05/05/12 0865 05/04/12 1746 05/04/12 1702  NA 142 142 140  K 3.5 3.1* 3.2*  CL 100 98 97  CO2 27 -- 27  GLUCOSE 136* 131* 139*  BUN 56* 48* 52*  CREATININE 8.97* 7.20* 7.58*  CALCIUM 6.2* -- 6.7*  ALB -- -- --  PHOS -- -- --   Liver Function Tests:  Lab 05/05/12 0147  AST 21  ALT 15  ALKPHOS 53  BILITOT 0.2*  PROT 6.7  ALBUMIN 2.7*    Lab 05/05/12 0147  LIPASE 61*  AMYLASE --   No results found for this basename: AMMONIA:3 in the last 168 hours CBC:  Lab 05/06/12 0945 05/05/12 1004 05/05/12 0147 05/04/12 1702  WBC 6.4 -- 5.0 5.9  NEUTROABS -- -- -- 4.2  HGB 6.6* 7.3* 7.4* --  HCT 19.5* 22.0* 22.1* --  MCV 88.2 -- 87.0 84.4  PLT 201 -- 215 222   Cardiac Enzymes: No results found for this basename: CKTOTAL:5,CKMB:5,CKMBINDEX:5,TROPONINI:5 in the last 168 hours CBG:  Lab 05/06/12 0721 05/06/12 0404 05/06/12 0010 05/05/12 1954 05/05/12 1620  GLUCAP 101* 100* 125* 108* 102*    Iron Studies: No results found for this basename: IRON,TIBC,TRANSFERRIN,FERRITIN in the last 72 hours Studies/Results: No results found. Medications:      . calcium carbonate  2 tablet Oral TID WC  . feeding supplement  1 Container Oral TID BM  . levETIRAcetam  1,000 mg Oral BID  . multivitamin  1 tablet Oral QHS  . pantoprazole  40 mg Oral QHS  . sodium chloride  3 mL Intravenous Q12H   I  have reviewed scheduled and prn medications.  Physical  Exam: General: after awoken nad,  Heart: RRR, no rub or murmurs Lungs: CTA bilat  Abdomen: Bs +=, soft non tender,  Extremities: Dialysis Access: no pedal edema, right ij perm cath  Patent on hd , Left upper arm AVF pos. bruit ( BVT)  Dialysis Orders: Center: AF on TTS .  EDW 64 kg HD Bath 2K+/ 2.25 Ca Time 4:00 Heparin 1500. Access Rt I-J BFR 400 DFR A1.5  Hectorol 1 mcg IV/HD Epogen 20,000 Units IV/HD Venofer 100 mg IV TIW  Several missed tx's this month. Early sign-off 1/16 with 1.5 hrs remaining \  Assessment/Plan:  1. Recurrent Acute normocytic anemia - Hgb dropping to 6.6. Hgb from  7.3.  For transfusion  On  hd 2 units prbcs // gi noted " Multifactorial Anemia " "holding GI work up" He refused their rectal exam. 2. ESRD - TTS @ [facility]. K+ 3.5. BUN 56 yesterday  Check am k and labs 3. Hypertension/volume - SBPs 180s upon admission, now improving to 148/56bp   noncompliance with home  meds. No evidence of volume excess. On admit  Pt was at EDW - will challenge 4. Anemia - Hgb now 6.6< 7.3 s/p transfusion of 1 unit. Baseline hgb is ~8. Will transfuse an additional  unit on HD. Marland Kitchen Ferritin 266. Tsat 21% . On  Aranesp 150. 5. Metabolic bone disease -  6.2 Ca(Ca corrected is 7.2)/ Last Phos 7. PTH 339.8. Continue Hectorol 1. Will order a binder and 2.50Ca bath.  Fu am renal panel 6. Nutrition - Alb 2.7. use High protein renal diet. Add Renal vitamin and Breeze 7. DM - Controlled on oral medications 8. Hx Diastolic HF - EF 16-10% in August 2013 per records 9. Hx of Seizure - On prophylactic Keppra s/p head injury 10. Hepatitis C 11. History cocaine abuse  Lenny Pastel, PA-C [facility] Beeper [phone] 05/06/2012,10:22 AM  LOS: 2 days   Patient seen and examined and above reviewed.  Agree with assessment and plan as above. Vinson Moselle  MD [facility] [phone] pgr    [phone] cell 05/06/2012, 12:20 PM

## 2012-05-06 NOTE — Progress Notes (Signed)
[facility] PROGRESS NOTE  [name] ZOX:[id number] DOB: November 03, 1955 DOA: 05/04/2012 PCP: No primary provider on file.  Assessment/Plan: 1. Recurrent Acute normocytic anemia  Patient presented with Hb of 6.8. Was transfused on 05/04/12 . Prior records indicate frequent admissions to [facility] for normocytic anemia requiring multiple transfusions in the past. No clear etiology from those records, as patient was FOBT + but no source of bleeding on EGD, capsule, or colonoscopy performed in [place], as recent as December 2012. Does have h/o H pylori. Will monitor for evidence of acute blood loss during this hospitalization. Prior hematologic workup at [facility] in 02/2012 revealed elevated LDH but normal haptoglobin. Low retic ct. High ferritin and TIBC. Direct antibody negative. 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 Dr. Truett Perna with hematology consulted and evaluated patient. Suspect BM process in light of low retic count and no evidence of active hemolysis. Patient refused BM biopsy.  Baseline Hb around 8.  Hemoglobin again low on 05/06/12 without overt GI bleed. Transfused 3 units of prbcs  F/u labs tomorrow  2. ESRD - HD  3. Reported hematochezia - Patient evaluated by Dr. [name] from GI - consultation and monitor closely.  4. Seizure disorder - continued on keppra 5. 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  6. Non compliance - [name]....   Code Status: full Family Communication: patient  Disposition Plan: home   Consultants:  GI   Renal  Procedures:  HD   HPI/Subjective: No further bleeding   Objective: Filed Vitals:   05/06/12 0847 05/06/12 0900 05/06/12 0930 05/06/12 1000  BP: 166/66 153/61 139/46 148/56  Pulse: 75 77 77 77  Temp:      TempSrc:      Resp: 17 16 26 18   Height:      Weight:      SpO2:         Intake/Output Summary (Last 24 hours) at 05/06/12 1037 Last data filed at 05/06/12 0600  Gross per 24 hour  Intake    600 ml  Output      0 ml  Net    600 ml   Filed Weights   05/04/12 2238 05/05/12 1950  Weight: 64 kg (141 lb 1.5 oz) 67.3 kg (148 lb 5.9 oz)    Exam:   General:  axox3  Cardiovascular: rrr  Respiratory: ctab  Abdomen: soft, nt   Data Reviewed: Basic Metabolic Panel:  Lab 05/05/12 8119 05/04/12 1746 05/04/12 1702  NA 142 142 140  K 3.5 3.1* 3.2*  CL 100 98 97  CO2 27 -- 27  GLUCOSE 136* 131* 139*  BUN 56* 48* 52*  CREATININE 8.97* 7.20* 7.58*  CALCIUM 6.2* -- 6.7*  MG -- -- --  PHOS -- -- --   Liver Function Tests:  Lab 05/05/12 0147  AST 21  ALT 15  ALKPHOS 53  BILITOT 0.2*  PROT 6.7  ALBUMIN 2.7*    Lab 05/05/12 0147  LIPASE 61*  AMYLASE --   No results found for this basename: AMMONIA:5 in the last 168 hours CBC:  Lab 05/06/12 0945 05/05/12 1004 05/05/12 0147 05/04/12 1746 05/04/12 1702  WBC 6.4 -- 5.0 -- 5.9  NEUTROABS -- -- -- -- 4.2  HGB 6.6* 7.3* 7.4* 7.1* 6.8*  HCT 19.5* 22.0* 22.1* 21.0* 20.0*  MCV 88.2 -- 87.0 -- 84.4  PLT  201 -- 215 -- 222   Cardiac Enzymes: No results found for this basename: CKTOTAL:5,CKMB:5,CKMBINDEX:5,TROPONINI:5 in the last 168 hours BNP (last 3 results) No results found for this basename: PROBNP:3 in the last 8760 hours CBG:  Lab 05/06/12 0721 05/06/12 0404 05/06/12 0010 05/05/12 1954 05/05/12 1620  GLUCAP 101* 100* 125* 108* 102*    No results found for this or any previous visit (from the past 240 hour(s)).   Studies: No results found.  Scheduled Meds:    . calcium carbonate  2 tablet Oral TID WC  . feeding supplement  1 Container Oral TID BM  . levETIRAcetam  1,000 mg Oral BID  . multivitamin  1 tablet Oral QHS  . pantoprazole  40 mg Oral QHS  . sodium chloride  3 mL Intravenous Q12H   Continuous Infusions:   Principal Problem:  *GI bleed Active Problems:   Hypertension  Diabetes mellitus without complication  ESRD (end stage renal disease) on dialysis  Hemorrhage of rectum and anus     [name]  [facility] Pager [phone]. If 8PM-8AM, please contact night-coverage at [email or web address], password [facility] 05/06/2012, 10:37 AM  LOS: 2 days

## 2012-05-07 LAB — GLUCOSE, CAPILLARY
Glucose-Capillary: 120 mg/dL — ABNORMAL HIGH (ref 70–99)
Glucose-Capillary: 149 mg/dL — ABNORMAL HIGH (ref 70–99)

## 2012-05-07 LAB — RENAL FUNCTION PANEL
Albumin: 2.7 g/dL — ABNORMAL LOW (ref 3.5–5.2)
Calcium: 7.5 mg/dL — ABNORMAL LOW (ref 8.4–10.5)
Chloride: 98 mEq/L (ref 96–112)
Creatinine, Ser: 6.74 mg/dL — ABNORMAL HIGH (ref 0.50–1.35)
GFR calc non Af Amer: 8 mL/min — ABNORMAL LOW (ref >90)
Phosphorus: 3.4 mg/dL (ref 2.3–4.6)

## 2012-05-07 LAB — TYPE AND SCREEN
ABO/RH(D): A POS
Antibody Screen: NEGATIVE
Unit division: 0
Unit division: 0

## 2012-05-07 LAB — CBC
MCHC: 33.2 g/dL (ref 30.0–36.0)
MCV: 88.6 fL (ref 78.0–100.0)
Platelets: 185 10*3/uL (ref 150–400)
RDW: 14.8 % (ref 11.5–15.5)
WBC: 7.3 10*3/uL (ref 4.0–10.5)

## 2012-05-07 MED ORDER — CALCIUM CARBONATE ANTACID 500 MG PO CHEW: 2.0000 | CHEWABLE_TABLET | Freq: Three times a day (TID) | ORAL | Status: DC

## 2012-05-07 MED ORDER — LEVETIRACETAM 1000 MG PO TABS: 1000.0000 mg | ORAL_TABLET | Freq: Two times a day (BID) | ORAL | Status: DC

## 2012-05-07 MED ORDER — TRAMADOL HCL 50 MG PO TABS: 50.0000 mg | ORAL_TABLET | Freq: Four times a day (QID) | ORAL | Status: DC | PRN

## 2012-05-07 MED ORDER — HYDRALAZINE HCL 50 MG PO TABS: 50.0000 mg | ORAL_TABLET | Freq: Two times a day (BID) | ORAL | Status: DC

## 2012-05-07 MED ORDER — RENA-VITE PO TABS: 1.0000 | ORAL_TABLET | Freq: Every day | ORAL | Status: DC

## 2012-05-07 MED ORDER — PANTOPRAZOLE SODIUM 40 MG PO TBEC: 40.0000 mg | DELAYED_RELEASE_TABLET | Freq: Every day | ORAL | Status: DC

## 2012-05-07 NOTE — Progress Notes (Signed)
Patient discharged home. Skin warm, dry and intact. Not in distress noted.

## 2012-05-07 NOTE — Discharge Summary (Signed)
Physician Discharge Summary  [name] ZOX:[id number] DOB: 11-01-1955 DOA: 05/04/2012  PCP: No primary provider on file.  Admit date: 05/04/2012 Discharge date: 05/07/2012  Time spent: 35 minutes  Recommendations for Outpatient Follow-up:  1. Patient was told to follow up with hematology for bone marrow biopsy   Discharge Diagnoses:  Worsening of anemia of chronic disease - patient transfused a total of 3 units of prbcs  Hematochezia - reported prior to admission - none seen in the hospital   Hypertension  Diabetes mellitus without complication  ESRD (end stage renal disease) on dialysis   Discharge Condition: good  Diet recommendation: renal   Filed Weights   05/05/12 1950 05/06/12 1307 05/06/12 2100  Weight: 67.3 kg (148 lb 5.9 oz) 65.2 kg (143 lb 11.8 oz) 66.8 kg (147 lb 4.3 oz)    History of present illness:  57 year old male with history of ESRD on hemodialysis on Tuesday Thursday and Saturday was found to have low hemoglobin today during dialysis. Patient states he has had at least 3 bloody bowel movement last night. Not able to exactly tell the quantity. In addition patient also has been having epigastric pain since yesterday. Denies any chest pain shortness of breath dizziness or loss of consciousness. In the ER patient's hemoglobin was found to be mildly decreased from his usual. Patient has been admitted for GI bleed. One unit of packed red blood cell transfusion has been already started. Looking back to patient's chart during the previous admission few months ago patient was found to have stool for occult blood positive. Patient states he has had a colonoscopy last year which was normal as per patient. Patient presently is hemodynamically stable and will be closely monitor in telemetry. Denies having used any NSAID's and is not on any blood thinners.   Hospital Course:  1. Recurrent Acute normocytic anemia  Patient presented with Hb of 6.8. Was transfused on 05/04/12 .  Prior records indicate frequent admissions to [facility] for normocytic anemia requiring multiple transfusions in the past. No clear etiology from those records, as patient was FOBT + but no source of bleeding on EGD, capsule, or colonoscopy performed in [place], as recent as December 2012. Does have h/o H pylori. Will monitor for evidence of acute blood loss during this hospitalization.  Prior hematologic workup at [facility] in 02/2012 revealed elevated LDH but normal haptoglobin. Low retic ct. High ferritin and TIBC. Direct antibody negative. 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 Dr. Truett Perna with hematology consulted and evaluated patient. Suspect BM process in light of low retic count and no evidence of active hemolysis. Patient refused BM biopsy.  Baseline Hb around 8.  Hemoglobin again low on 05/06/12 without overt GI bleed. Transfused 3 units of prbcs  F/u labs showed stability of CBC and patient was referred for outpt hematology w/u  2. ESRD - HD  3. Reported hematochezia - Patient evaluated by Dr. [name] from GI - he did not consider that the patient needed repeat EGD and colonoscopy  4. Seizure disorder - continued on keppra  5. 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 PCP to repeat       Procedures:  HD with transfusion   Consultations:  Renal  [facility]   Discharge Exam: Filed Vitals:   05/06/12 1753 05/06/12 2100 05/07/12 0600 05/07/12 0853  BP: 196/84 186/69 174/72 148/75  Pulse: 86 81 79 78  Temp: 98.5 F (36.9 C) 99.3 F (37.4  C) 97.6 F (36.4 C) 98.5 F (36.9 C)  TempSrc: Oral Oral Oral Oral  Resp: 22 20 18 19   Height:      Weight:  66.8 kg (147 lb 4.3 oz)    SpO2: 98% 95% 94% 97%    General: axox3 Cardiovascular: rrr Respiratory: ctab   Discharge Instructions  Discharge Orders    Future Orders Please Complete By Expires   Diet renal 60/[id number]      Increase activity slowly           Medication List     As of 05/07/2012  9:51 AM    STOP taking these medications         benazepril 10 MG tablet   Commonly known as: LOTENSIN      metoprolol 50 MG tablet   Commonly known as: LOPRESSOR      thiamine 100 MG tablet      TAKE these medications         calcium carbonate 500 MG chewable tablet   Commonly known as: TUMS - dosed in mg elemental calcium   Chew 2 tablets (400 mg of elemental calcium total) by mouth 3 (three) times daily with meals.      folic acid 1 MG tablet   Commonly known as: FOLVITE   Take 1 tablet (1 mg total) by mouth daily.      hydrALAZINE 50 MG tablet   Commonly known as: APRESOLINE   Take 1 tablet (50 mg total) by mouth 2 (two) times daily.      levETIRAcetam 1000 MG tablet   Commonly known as: KEPPRA   Take 1 tablet (1,000 mg total) by mouth 2 (two) times daily.      multivitamin Tabs tablet   Take 1 tablet by mouth at bedtime.      pantoprazole 40 MG tablet   Commonly known as: PROTONIX   Take 1 tablet (40 mg total) by mouth at bedtime.      traMADol 50 MG tablet   Commonly known as: ULTRAM   Take 1 tablet (50 mg total) by mouth every 6 (six) hours as needed for pain. For pain           Follow-up Information    Follow up with [facility] at the dialysis center.      Schedule an appointment as soon as possible for a visit with Thornton Papas, MD. (hematologist for blood problem)    Contact information:   [address] [place] [address] [phone]           The results of significant diagnostics from this hospitalization (including imaging, microbiology, ancillary and laboratory) are listed below for reference.    Significant Diagnostic Studies: Dg Chest 2 View  05/06/2012  *RADIOLOGY REPORT*  Clinical Data: 57 year old male - follow-up right lung nodular opacity.  CHEST - 2 VIEW  Comparison: 03/02/2012  Findings: Mild cardiomegaly is again identified. Mild peribronchial thickening is  stable. No nodular opacities are identified.  The 12 mm nodular opacity identified on the prior study is no longer evident. There is no evidence of focal airspace disease, pulmonary edema, suspicious pulmonary mass, pleural effusion, or pneumothorax. No acute bony abnormalities are identified.  A right IJ central venous catheter is again noted with tips overlying the upper right atrium.  IMPRESSION: Nodular opacity overlying the right lung identified on the prior study is no longer evident.  No evidence of acute cardiopulmonary disease.  Mild cardiomegaly.   Original Report  Authenticated By: Harmon Pier, M.D.     Microbiology: No results found for this or any previous visit (from the past 240 hour(s)).   Labs: Basic Metabolic Panel:  Lab 05/07/12 4540 05/06/12 0945 05/05/12 0147 05/04/12 1746 05/04/12 1702  NA 136 136 142 142 140  K 3.7 4.1 3.5 3.1* 3.2*  CL 98 94* 100 98 97  CO2 26 24 27  -- 27  GLUCOSE 144* 147* 136* 131* 139*  BUN 52* 84* 56* 48* 52*  CREATININE 6.74* 11.25* 8.97* 7.20* 7.58*  CALCIUM 7.5* 5.5* 6.2* -- 6.7*  MG -- -- -- -- --  PHOS 3.4 7.3* -- -- --   Liver Function Tests:  Lab 05/07/12 0835 05/06/12 0945 05/05/12 0147  AST -- -- 21  ALT -- -- 15  ALKPHOS -- -- 53  BILITOT -- -- 0.2*  PROT -- -- 6.7  ALBUMIN 2.7* 2.7* 2.7*    Lab 05/05/12 0147  LIPASE 61*  AMYLASE --   No results found for this basename: AMMONIA:5 in the last 168 hours CBC:  Lab 05/07/12 0835 05/06/12 0945 05/05/12 1004 05/05/12 0147 05/04/12 1746 05/04/12 1702  WBC 7.3 6.4 -- 5.0 -- 5.9  NEUTROABS -- -- -- -- -- 4.2  HGB 8.8* 6.6* 7.3* 7.4* 7.1* --  HCT 26.5* 19.5* 22.0* 22.1* 21.0* --  MCV 88.6 88.2 -- 87.0 -- 84.4  PLT 185 201 -- 215 -- 222   Cardiac Enzymes: No results found for this basename: CKTOTAL:5,CKMB:5,CKMBINDEX:5,TROPONINI:5 in the last 168 hours BNP: BNP (last 3 results) No results found for this basename: PROBNP:3 in the last 8760 hours CBG:  Lab 05/07/12 0748  05/07/12 0421 05/07/12 0019 05/06/12 1944 05/06/12 1536  GLUCAP 113* 132* 149* 139* 146*       Signed:  Zachariah Pavek  [facility] 05/07/2012, 9:51 AM

## 2012-05-07 NOTE — Progress Notes (Signed)
Discussed discharge instructions and medications with pt. Pt showed no barriers to discharge. Assessment unchanged from morning. IV's removed. Pt awaiting ride home with family member, that won't be here until 6pm. Pt unable to take a taxi or bus to his house, because he does not have a key.

## 2012-05-07 NOTE — Progress Notes (Signed)
Patient is unable to get in touch with his friend who have his  keys to his house. Will Update MD on call.

## 2012-05-25 ENCOUNTER — Encounter: Payer: Self-pay | Admitting: Vascular Surgery

## 2012-05-26 ENCOUNTER — Encounter: Payer: Self-pay | Admitting: Vascular Surgery

## 2012-05-26 ENCOUNTER — Ambulatory Visit (INDEPENDENT_AMBULATORY_CARE_PROVIDER_SITE_OTHER): Payer: Medicare Other | Admitting: Vascular Surgery

## 2012-05-26 VITALS — BP 108/54 | HR 70 | Ht 70.0 in | Wt 151.8 lb

## 2012-05-26 DIAGNOSIS — N186 End stage renal disease: Secondary | ICD-10-CM

## 2012-05-26 NOTE — Progress Notes (Signed)
[facility]  Postoperative Access Visit  History of Present Illness  [name] is a 57 y.o. year old male who presents for postoperative follow-up for: L1st stage BVT (Date: 03/07/12).  The patient's wounds are healed.  The patient notes no steal symptoms.  The patient is able to complete their activities of daily living.  The patient's current symptoms are: none.  Physical Examination  Filed Vitals:   05/26/12 1453  BP: 108/54  Pulse: 70   LUE: Incision is healed, skin feels warm, hand grip is 5/5, sensation in digits is intact, palpable thrill, bruit can be auscultated , On sonosite: 6 mm throughout, short basilic vein  Medical Decision Making  [name] is a 57 y.o. year old male who presents s/p successful L 1st stage BVT.  The fistula is now ready for 2nd stage transposition, which will be scheduled for this Wednesday, 12 FEB 14.  Risk, benefits, and alternatives to access surgery were discussed.  The patient is aware the risks include but are not limited to: bleeding, infection, steal syndrome, nerve damage, ischemic monomelic neuropathy, failure to mature, need for additional procedures, death and stroke.    The patient is aware that a big incision is necessary to complete the transposition.  The patient agrees to proceed forward with the procedure.  Thank you for allowing [place] to participate in this patient's care.  Leonides Sake, MD [facility] Office: [phone] Pager: [phone]

## 2012-05-29 ENCOUNTER — Other Ambulatory Visit: Payer: Self-pay

## 2012-05-30 ENCOUNTER — Encounter (HOSPITAL_COMMUNITY): Payer: Self-pay | Admitting: *Deleted

## 2012-05-30 MED ORDER — DEXTROSE 5 % IV SOLN
1.5000 g | INTRAVENOUS | Status: AC
  Administered 2012-05-31: 1.5 g via INTRAVENOUS
  Filled 2012-05-30: qty 1.5

## 2012-05-31 ENCOUNTER — Encounter (HOSPITAL_COMMUNITY): Payer: Self-pay | Admitting: Anesthesiology

## 2012-05-31 ENCOUNTER — Inpatient Hospital Stay (HOSPITAL_COMMUNITY)
Admission: RE | Admit: 2012-05-31 | Discharge: 2012-06-02 | DRG: 264 | Disposition: A | Discharge status: Home / Self Care | Payer: Medicare Other | Source: Ambulatory Visit | Attending: Vascular Surgery | Admitting: Vascular Surgery

## 2012-05-31 ENCOUNTER — Ambulatory Visit (HOSPITAL_COMMUNITY): Payer: Medicare Other | Admitting: Anesthesiology

## 2012-05-31 ENCOUNTER — Telehealth: Payer: Self-pay | Admitting: Vascular Surgery

## 2012-05-31 ENCOUNTER — Encounter (HOSPITAL_COMMUNITY): Payer: Self-pay | Admitting: *Deleted

## 2012-05-31 ENCOUNTER — Encounter (HOSPITAL_COMMUNITY)
Admission: RE | Disposition: A | Discharge status: Home / Self Care | Payer: Self-pay | Source: Ambulatory Visit | Attending: Vascular Surgery

## 2012-05-31 DIAGNOSIS — N2581 Secondary hyperparathyroidism of renal origin: Secondary | ICD-10-CM | POA: Diagnosis present

## 2012-05-31 DIAGNOSIS — B1920 Unspecified viral hepatitis C without hepatic coma: Secondary | ICD-10-CM | POA: Diagnosis present

## 2012-05-31 DIAGNOSIS — Z992 Dependence on renal dialysis: Secondary | ICD-10-CM

## 2012-05-31 DIAGNOSIS — N186 End stage renal disease: Secondary | ICD-10-CM | POA: Diagnosis present

## 2012-05-31 DIAGNOSIS — T82898A Other specified complication of vascular prosthetic devices, implants and grafts, initial encounter: Secondary | ICD-10-CM

## 2012-05-31 DIAGNOSIS — F17200 Nicotine dependence, unspecified, uncomplicated: Secondary | ICD-10-CM | POA: Diagnosis present

## 2012-05-31 DIAGNOSIS — Y832 Surgical operation with anastomosis, bypass or graft as the cause of abnormal reaction of the patient, or of later complication, without mention of misadventure at the time of the procedure: Secondary | ICD-10-CM | POA: Diagnosis present

## 2012-05-31 DIAGNOSIS — E119 Type 2 diabetes mellitus without complications: Secondary | ICD-10-CM | POA: Diagnosis present

## 2012-05-31 DIAGNOSIS — D638 Anemia in other chronic diseases classified elsewhere: Secondary | ICD-10-CM | POA: Diagnosis present

## 2012-05-31 DIAGNOSIS — I120 Hypertensive chronic kidney disease with stage 5 chronic kidney disease or end stage renal disease: Secondary | ICD-10-CM | POA: Diagnosis present

## 2012-05-31 HISTORY — DX: Personal history of other medical treatment: Z92.89

## 2012-05-31 HISTORY — DX: Concussion with loss of consciousness status unknown, initial encounter: S06.0XAA

## 2012-05-31 HISTORY — DX: Concussion with loss of consciousness of unspecified duration, initial encounter: S06.0X9A

## 2012-05-31 HISTORY — PX: BASCILIC VEIN TRANSPOSITION: SHX5742

## 2012-05-31 HISTORY — DX: Unspecified osteoarthritis, unspecified site: M19.90

## 2012-05-31 HISTORY — DX: Headache: R51

## 2012-05-31 HISTORY — DX: Melena: K92.1

## 2012-05-31 LAB — POCT I-STAT 4, (NA,K, GLUC, HGB,HCT)
Glucose, Bld: 97 mg/dL (ref 70–99)
HCT: 37 % — ABNORMAL LOW (ref 39.0–52.0)
Hemoglobin: 12.6 g/dL — ABNORMAL LOW (ref 13.0–17.0)

## 2012-05-31 LAB — SURGICAL PCR SCREEN: Staphylococcus aureus: NEGATIVE

## 2012-05-31 LAB — GLUCOSE, CAPILLARY: Glucose-Capillary: 98 mg/dL (ref 70–99)

## 2012-05-31 SURGERY — TRANSPOSITION, VEIN, BASILIC
Anesthesia: General | Site: Arm Upper | Laterality: Left | Wound class: Clean

## 2012-05-31 MED ORDER — POTASSIUM CHLORIDE CRYS ER 20 MEQ PO TBCR: 20.0000 meq | EXTENDED_RELEASE_TABLET | Freq: Once | ORAL | Status: DC

## 2012-05-31 MED ORDER — PHENOL 1.4 % MT LIQD
1.0000 | OROMUCOSAL | Status: DC | PRN
  Filled 2012-05-31: qty 177

## 2012-05-31 MED ORDER — HYDROMORPHONE HCL PF 1 MG/ML IJ SOLN
INTRAMUSCULAR | Status: AC
  Filled 2012-05-31: qty 1

## 2012-05-31 MED ORDER — SODIUM CHLORIDE 0.9 % IV SOLN: 250.0000 mL | INTRAVENOUS | Status: DC | PRN

## 2012-05-31 MED ORDER — 0.9 % SODIUM CHLORIDE (POUR BTL) OPTIME
TOPICAL | Status: DC | PRN
  Administered 2012-05-31: 1000 mL

## 2012-05-31 MED ORDER — HEPARIN SODIUM (PORCINE) 1000 UNIT/ML IJ SOLN
INTRAMUSCULAR | Status: DC | PRN
  Administered 2012-05-31: 3000 [IU] via INTRAVENOUS
  Administered 2012-05-31: 2000 [IU] via INTRAVENOUS

## 2012-05-31 MED ORDER — THROMBIN 20000 UNITS EX SOLR
CUTANEOUS | Status: AC
  Filled 2012-05-31: qty 20000

## 2012-05-31 MED ORDER — OXYCODONE HCL 5 MG PO TABS
5.0000 mg | ORAL_TABLET | ORAL | Status: DC | PRN
  Administered 2012-05-31: 10 mg via ORAL
  Filled 2012-05-31: qty 10

## 2012-05-31 MED ORDER — THROMBIN 20000 UNITS EX SOLR
CUTANEOUS | Status: DC | PRN
  Administered 2012-05-31: 12:00:00 via TOPICAL

## 2012-05-31 MED ORDER — OXYCODONE HCL 5 MG PO TABS: 5.0000 mg | ORAL_TABLET | Freq: Four times a day (QID) | ORAL | Status: DC | PRN

## 2012-05-31 MED ORDER — PROPOFOL 10 MG/ML IV BOLUS
INTRAVENOUS | Status: DC | PRN
  Administered 2012-05-31: 200 mg via INTRAVENOUS

## 2012-05-31 MED ORDER — MUPIROCIN 2 % EX OINT
TOPICAL_OINTMENT | Freq: Two times a day (BID) | CUTANEOUS | Status: DC
  Administered 2012-05-31 – 2012-06-02 (×4): via NASAL

## 2012-05-31 MED ORDER — SODIUM CHLORIDE 0.9 % IJ SOLN: 3.0000 mL | INTRAMUSCULAR | Status: DC | PRN

## 2012-05-31 MED ORDER — LIDOCAINE HCL (CARDIAC) 20 MG/ML IV SOLN
INTRAVENOUS | Status: DC | PRN
  Administered 2012-05-31: 50 mg via INTRAVENOUS

## 2012-05-31 MED ORDER — PROTAMINE SULFATE 10 MG/ML IV SOLN
INTRAVENOUS | Status: DC | PRN
  Administered 2012-05-31: 10 mg via INTRAVENOUS
  Administered 2012-05-31: 20 mg via INTRAVENOUS

## 2012-05-31 MED ORDER — THROMBIN 20000 UNITS EX SOLR
CUTANEOUS | Status: DC | PRN
  Administered 2012-05-31: 13:00:00 via TOPICAL

## 2012-05-31 MED ORDER — THROMBIN 20000 UNITS EX SOLR
OROMUCOSAL | Status: DC | PRN
  Administered 2012-05-31: 12:00:00 via TOPICAL

## 2012-05-31 MED ORDER — SODIUM CHLORIDE 0.9 % IR SOLN
Status: DC | PRN
  Administered 2012-05-31: 11:00:00

## 2012-05-31 MED ORDER — SODIUM CHLORIDE 0.9 % IJ SOLN
3.0000 mL | Freq: Two times a day (BID) | INTRAMUSCULAR | Status: DC
  Administered 2012-05-31 – 2012-06-01 (×3): 3 mL via INTRAVENOUS

## 2012-05-31 MED ORDER — FENTANYL CITRATE 0.05 MG/ML IJ SOLN
INTRAMUSCULAR | Status: DC | PRN
  Administered 2012-05-31: 100 ug via INTRAVENOUS

## 2012-05-31 MED ORDER — SODIUM CHLORIDE 0.9 % IV SOLN
INTRAVENOUS | Status: DC
  Administered 2012-05-31 (×3): via INTRAVENOUS

## 2012-05-31 MED ORDER — PHENYLEPHRINE HCL 10 MG/ML IJ SOLN
INTRAMUSCULAR | Status: DC | PRN
  Administered 2012-05-31 (×4): 80 ug via INTRAVENOUS
  Administered 2012-05-31: 40 ug via INTRAVENOUS
  Administered 2012-05-31 (×2): 80 ug via INTRAVENOUS

## 2012-05-31 MED ORDER — PANTOPRAZOLE SODIUM 40 MG PO TBEC
40.0000 mg | DELAYED_RELEASE_TABLET | Freq: Every day | ORAL | Status: DC
  Administered 2012-05-31 – 2012-06-02 (×3): 40 mg via ORAL
  Filled 2012-05-31 (×3): qty 1

## 2012-05-31 MED ORDER — MIDAZOLAM HCL 5 MG/5ML IJ SOLN
INTRAMUSCULAR | Status: DC | PRN
  Administered 2012-05-31: 2 mg via INTRAVENOUS

## 2012-05-31 MED ORDER — GUAIFENESIN-DM 100-10 MG/5ML PO SYRP
15.0000 mL | ORAL_SOLUTION | ORAL | Status: DC | PRN
  Filled 2012-05-31: qty 15

## 2012-05-31 MED ORDER — LABETALOL HCL 5 MG/ML IV SOLN
10.0000 mg | INTRAVENOUS | Status: DC | PRN
  Filled 2012-05-31: qty 4

## 2012-05-31 MED ORDER — HYDRALAZINE HCL 20 MG/ML IJ SOLN
10.0000 mg | INTRAMUSCULAR | Status: DC | PRN
  Filled 2012-05-31: qty 0.5

## 2012-05-31 MED ORDER — HYDROMORPHONE HCL PF 1 MG/ML IJ SOLN
0.2500 mg | INTRAMUSCULAR | Status: DC | PRN
  Administered 2012-05-31 (×3): 0.5 mg via INTRAVENOUS

## 2012-05-31 MED ORDER — METOPROLOL TARTRATE 1 MG/ML IV SOLN
2.0000 mg | INTRAVENOUS | Status: DC | PRN
  Filled 2012-05-31: qty 5

## 2012-05-31 MED ORDER — MUPIROCIN 2 % EX OINT
TOPICAL_OINTMENT | CUTANEOUS | Status: AC
  Administered 2012-05-31: 08:00:00 via NASAL
  Filled 2012-05-31: qty 22

## 2012-05-31 MED ORDER — VECURONIUM BROMIDE 10 MG IV SOLR
INTRAVENOUS | Status: DC | PRN
  Administered 2012-05-31: 8 mg via INTRAVENOUS

## 2012-05-31 MED ORDER — HYDROMORPHONE HCL PF 1 MG/ML IJ SOLN
0.5000 mg | INTRAMUSCULAR | Status: DC | PRN
  Administered 2012-05-31 – 2012-06-02 (×7): 1 mg via INTRAVENOUS
  Filled 2012-05-31 (×6): qty 1

## 2012-05-31 MED ORDER — ONDANSETRON HCL 4 MG/2ML IJ SOLN: 4.0000 mg | Freq: Four times a day (QID) | INTRAMUSCULAR | Status: DC | PRN

## 2012-05-31 SURGICAL SUPPLY — 46 items
CANISTER SUCTION 2500CC (MISCELLANEOUS) ×2 IMPLANT
CLIP TI MEDIUM 24 (CLIP) ×2 IMPLANT
CLIP TI WIDE RED SMALL 24 (CLIP) ×2 IMPLANT
CLOTH BEACON ORANGE TIMEOUT ST (SAFETY) ×2 IMPLANT
CORDS BIPOLAR (ELECTRODE) IMPLANT
COVER PROBE W GEL 5X96 (DRAPES) ×2 IMPLANT
COVER SURGICAL LIGHT HANDLE (MISCELLANEOUS) ×2 IMPLANT
DECANTER SPIKE VIAL GLASS SM (MISCELLANEOUS) ×2 IMPLANT
DERMABOND ADHESIVE PROPEN (GAUZE/BANDAGES/DRESSINGS) ×1
DERMABOND ADVANCED (GAUZE/BANDAGES/DRESSINGS) ×1
DERMABOND ADVANCED .7 DNX12 (GAUZE/BANDAGES/DRESSINGS) ×1 IMPLANT
DERMABOND ADVANCED .7 DNX6 (GAUZE/BANDAGES/DRESSINGS) ×1 IMPLANT
DRAIN PENROSE 1/2X12 LTX STRL (WOUND CARE) IMPLANT
ELECT REM PT RETURN 9FT ADLT (ELECTROSURGICAL) ×2
ELECTRODE REM PT RTRN 9FT ADLT (ELECTROSURGICAL) ×1 IMPLANT
GLOVE BIO SURGEON STRL SZ 6.5 (GLOVE) ×4 IMPLANT
GLOVE BIO SURGEON STRL SZ7 (GLOVE) ×2 IMPLANT
GLOVE BIOGEL PI IND STRL 6.5 (GLOVE) ×2 IMPLANT
GLOVE BIOGEL PI IND STRL 7.0 (GLOVE) ×2 IMPLANT
GLOVE BIOGEL PI IND STRL 7.5 (GLOVE) ×2 IMPLANT
GLOVE BIOGEL PI INDICATOR 6.5 (GLOVE) ×2
GLOVE BIOGEL PI INDICATOR 7.0 (GLOVE) ×2
GLOVE BIOGEL PI INDICATOR 7.5 (GLOVE) ×2
GLOVE SS N UNI LF 6.5 STRL (GLOVE) ×2 IMPLANT
GLOVE SS N UNI LF 7.0 STRL (GLOVE) ×2 IMPLANT
GOWN STRL NON-REIN LRG LVL3 (GOWN DISPOSABLE) ×4 IMPLANT
GRAFT GORETEX T/W 6X10 (Vascular Products) ×2 IMPLANT
KIT BASIN OR (CUSTOM PROCEDURE TRAY) ×2 IMPLANT
KIT ROOM TURNOVER OR (KITS) ×2 IMPLANT
NS IRRIG 1000ML POUR BTL (IV SOLUTION) ×2 IMPLANT
PACK CV ACCESS (CUSTOM PROCEDURE TRAY) ×2 IMPLANT
PAD ARMBOARD 7.5X6 YLW CONV (MISCELLANEOUS) ×4 IMPLANT
SPONGE SURGIFOAM ABS GEL 100 (HEMOSTASIS) ×6 IMPLANT
SUT MNCRL AB 4-0 PS2 18 (SUTURE) ×4 IMPLANT
SUT PROLENE 5 0 C 1 24 (SUTURE) ×4 IMPLANT
SUT PROLENE 6 0 BV (SUTURE) ×4 IMPLANT
SUT PROLENE 7 0 BV 1 (SUTURE) ×2 IMPLANT
SUT SILK 2 0 SH (SUTURE) ×2 IMPLANT
SUT SILK 3 0 (SUTURE) ×1
SUT SILK 3-0 18XBRD TIE 12 (SUTURE) ×1 IMPLANT
SUT VIC AB 3-0 SH 27 (SUTURE) ×3
SUT VIC AB 3-0 SH 27X BRD (SUTURE) ×3 IMPLANT
TOWEL OR 17X24 6PK STRL BLUE (TOWEL DISPOSABLE) ×2 IMPLANT
TOWEL OR 17X26 10 PK STRL BLUE (TOWEL DISPOSABLE) ×2 IMPLANT
UNDERPAD 30X30 INCONTINENT (UNDERPADS AND DIAPERS) ×2 IMPLANT
WATER STERILE IRR 1000ML POUR (IV SOLUTION) ×2 IMPLANT

## 2012-05-31 NOTE — Progress Notes (Addendum)
05/31/2012 patient came from pacu to [id number] at 1657. [name] alert, oriented and ambulatory. He had a 2nd stage basilic vein transposition with gortex graft revision. Patient have incision to the left inner arm with skin glue and  the area is clean and dry. Have scab on the right knee and lower leg. Patient was placed on telemetry when arrive to unit. Biochemist, clinical.

## 2012-05-31 NOTE — Progress Notes (Signed)
This note also relates to the following rows which could not be included: Pulse Rate - Cannot attach notes to unvalidated device data ECG Heart Rate - Cannot attach notes to unvalidated device data Resp - Cannot attach notes to unvalidated device data SpO2 - Cannot attach notes to unvalidated device data   Dr. Randa Evens called informed B/P elevated no orders received

## 2012-05-31 NOTE — Anesthesia Postprocedure Evaluation (Signed)
  Anesthesia Post-op Note  Patient: [name]  Procedure(s) Performed: Procedure(s) with comments: BASCILIC VEIN TRANSPOSITION (Left) - Left 2nd Stage Basilic Vein Transposition with gortex graft revision using 6mmx10cm graft  Patient Location: PACU  Anesthesia Type:General  Level of Consciousness: awake  Airway and Oxygen Therapy: Patient Spontanous Breathing  Post-op Pain: mild  Post-op Assessment: Post-op Vital signs reviewed  Post-op Vital Signs: Reviewed  Complications: No apparent anesthesia complications 

## 2012-05-31 NOTE — Anesthesia Procedure Notes (Signed)
Procedure Name: Intubation Date/Time: 05/31/2012 10:52 AM Performed by: Carmela Rima Pre-anesthesia Checklist: Timeout performed, Patient identified, Emergency Drugs available, Suction available and Patient being monitored Patient Re-evaluated:Patient Re-evaluated prior to inductionOxygen Delivery Method: Circle system utilized Preoxygenation: Pre-oxygenation with 100% oxygen Intubation Type: IV induction Ventilation: Mask ventilation without difficulty Laryngoscope Size: Mac and 3 Grade View: Grade I Tube type: Oral Tube size: 7.5 mm Number of attempts: 1 Placement Confirmation: ETT inserted through vocal cords under direct vision,  positive ETCO2 and breath sounds checked- equal and bilateral Secured at: 23 cm Tube secured with: Tape Dental Injury: Teeth and Oropharynx as per pre-operative assessment

## 2012-05-31 NOTE — Anesthesia Postprocedure Evaluation (Signed)
  Anesthesia Post-op Note  Patient: [name]  Procedure(s) Performed: Procedure(s) with comments: BASCILIC VEIN TRANSPOSITION (Left) - Left 2nd Stage Basilic Vein Transposition with gortex graft revision using 39mmx10cm graft  Patient Location: PACU  Anesthesia Type:General  Level of Consciousness: awake  Airway and Oxygen Therapy: Patient Spontanous Breathing  Post-op Pain: mild  Post-op Assessment: Post-op Vital signs reviewed  Post-op Vital Signs: Reviewed  Complications: No apparent anesthesia complications

## 2012-05-31 NOTE — Preoperative (Signed)
Beta Blockers   Reason not to administer Beta Blockers:Not Applicable 

## 2012-05-31 NOTE — H&P (Signed)
[facility] [place]  Brief History and Physical  History of Present Illness  [name] is a 57 y.o. male who presents with chief complaint: end stage renal disease.  The patient presents today for Left 2nd stage BVT.    Past Medical History  Diagnosis Date  . Hypertension   . Diabetes mellitus without complication     on oral pills only  . ESRD (end stage renal disease) on dialysis since 2012  . Seizure disorder     questionable history of - will need to clarify with PCP  . Anemia, chronic disease   . CHF (congestive heart failure)     diastolic.  EF 60 - 65% per [facility] eco 11/2011  . Cocaine abuse     mentioned in notes from [place]  . Hepatitis C antibody test positive     was HIV negative, 02/28/12  . Hepatitis B core antibody positive     03/01/10  . Positive QuantiFERON-TB Gold test     11/2011  . Helicobacter pylori gastritis     not defined if this was treated  . Polyp of colon, adenomatous     May 2012.  Dr [name] in [place]  . Shortness of breath   . Hematochezia   . Seizures   . Headache   . Head injury, closed, with concussion   . Arthritis     shoulder  . History of blood transfusion     Past Surgical History  Procedure Laterality Date  . Shoulder surgery      right  . Knee surgery      left  . Bascilic vein transposition  03/07/2012    Procedure: BASCILIC VEIN TRANSPOSITION;  Surgeon: Fransisco Hertz, MD;  Location: [facility] OR;  Service: Vascular;  Laterality: Left;  First Stage  . Insertion of dialysis catheter      right chest    History   Social History  . Marital Status: Single    Spouse Name: N/A    Number of Children: N/A  . Years of Education: N/A   Occupational History  . Not on file.   Social History Main Topics  . Smoking status: Current Every Day Smoker -- 0.20 packs/day for 15 years    Types: Cigarettes  . Smokeless tobacco: Never Used  . Alcohol Use: Yes     Comment: pt does not remember the last  time he drank.  . Drug Use: Yes     Comment: smokes crack-cocaine. 05/30/12- last time 3 months ago  . Sexually Active: Not Currently   Other Topics Concern  . Not on file   Social History Narrative  . No narrative on file    Family History  Problem Relation Age of Onset  . Diabetes Mother   . Hypertension Mother   . Stroke Mother   . Kidney failure Mother     No current facility-administered medications on file prior to encounter.   No current outpatient prescriptions on file prior to encounter.    Allergies  Allergen Reactions  . Reglan (Metoclopramide) Other (See Comments)    Tardive dyskinesia in 11/2011 in [place]    Review of Systems: As listed above, otherwise negative.  Physical Examination  Filed Vitals:   05/31/12 0810 05/31/12 0842  BP: 196/94   Pulse: 87   Temp: 98.8 F (37.1 C)   TempSrc: Oral   Resp: 18   Height:  5\' 9"  (1.753 m)  Weight:  140 lb 6.9  oz (63.7 kg)  SpO2: 100%     General: A&O x 3, WDWN  Pulmonary: Sym exp, good air movt, CTAB, no rales, rhonchi, & wheezing  Cardiac: RRR, Nl S1, S2, no Murmurs, rubs or gallops  Gastrointestinal: soft, NTND, -G/R, - HSM, - masses, - CVAT B  Musculoskeletal: M/S 5/5 throughout , Extremities without ischemic changes , palpable thrill in L arm with +bruit  Laboratory See iStat  Medical Decision Making  [name] is a 57 y.o. male who presents with: end stage renal disease, s/p successful L 1st stage BVT.   The patient is scheduled for: L 2nd stage BVT  Risk, benefits, and alternatives to access surgery were discussed.  The patient is aware the risks include but are not limited to: bleeding, infection, steal syndrome, nerve damage, ischemic monomelic neuropathy, failure to mature, and need for additional procedures.  The patient is aware of the risks and agrees to proceed.  Leonides Sake, MD [facility] [place] Office: [phone] Pager:  [phone]  05/31/2012, 10:14 AM

## 2012-05-31 NOTE — Telephone Encounter (Signed)
Message copied by Margaretmary Eddy on Wed May 31, 2012  2:34 PM ------      Message from: Lorin Mercy K      Created: Wed May 31, 2012  2:11 PM      Regarding: schedule                   ----- Message -----         From: Dara Lords, PA         Sent: 05/31/2012   1:55 PM           To: Sharee Pimple, CMA            S/p left 2nd stage BVT by [facility] today.  F/u with him in 4 weeks.            Thanks,      Lelon Mast ------

## 2012-05-31 NOTE — Progress Notes (Signed)
Positive bruit and thrill on upper left arm incision 2+ left radial pulse pt has dialysis catheter in right chest capped off with dry dressing

## 2012-05-31 NOTE — Anesthesia Preprocedure Evaluation (Addendum)
Anesthesia Evaluation  Patient identified by MRN, date of birth, ID band Patient awake  General Assessment Comment:Extensive medical history noted.  Reviewed: Allergy & Precautions, H&P , NPO status , Patient's Chart, lab work & pertinent test results  Airway Mallampati: II      Dental  (+) Teeth Intact and Dental Advidsory Given   Pulmonary shortness of breath,  breath sounds clear to auscultation        Cardiovascular hypertension, Rhythm:Regular Rate:Normal     Neuro/Psych  Headaches,    GI/Hepatic negative GI ROS, Neg liver ROS,   Endo/Other  diabetes  Renal/GU Renal disease     Musculoskeletal   Abdominal   Peds  Hematology   Anesthesia Other Findings   Reproductive/Obstetrics                        Anesthesia Physical Anesthesia Plan  ASA: III  Anesthesia Plan: General   Post-op Pain Management:    Induction: Intravenous  Airway Management Planned: LMA  Additional Equipment:   Intra-op Plan:   Post-operative Plan: Extubation in OR  Informed Consent: I have reviewed the patients History and Physical, chart, labs and discussed the procedure including the risks, benefits and alternatives for the proposed anesthesia with the patient or authorized representative who has indicated his/her understanding and acceptance.   Dental advisory given and Dental Advisory Given  Plan Discussed with: CRNA, Anesthesiologist and Surgeon  Anesthesia Plan Comments:        Anesthesia Quick Evaluation

## 2012-05-31 NOTE — Op Note (Signed)
OPERATIVE NOTE   PROCEDURE: 1.  Revision of left basilic vein transposition with interposition graft  PRE-OPERATIVE DIAGNOSIS: end stage renal disease   POST-OPERATIVE DIAGNOSIS: same as above   SURGEON: Leonides Sake, MD  ASSISTANT(S): Lianne Cure, PAC   ANESTHESIA: general  ESTIMATED BLOOD LOSS: 100 cc  FINDING(S): 1.  Basilic vein > 6 mm throughout except 6 cm distally: sclerotic lumen upon exploration 2.  Strong thrill after revision with 6 mm Goretex interposition graft  SPECIMEN(S):  none  INDICATIONS:   [name] is a 57 y.o. male who presents with end stage renal disease and successful left first stage basilic vein transposition.  The patient is scheduled for left second stage basilic vein transposition.  The patient is aware the risks include but are not limited to: bleeding, infection, steal syndrome, nerve damage, ischemic monomelic neuropathy, failure to mature, and need for additional procedures.  The patient is aware of the risks of the procedure and elects to proceed forward.  DESCRIPTION: After full informed written consent was obtained from the patient, the patient was brought back to the operating room and placed supine upon the operating table.  Prior to induction, the patient received IV antibiotics.   After obtaining adequate anesthesia, the patient was then prepped and draped in the standard fashion for a left arm access procedure.  I turned my attention first to identifying the patient's brachiobasilic arteriovenous fistula.  Using SonoSite guidance, the location of this fistula was marked out on the skin.   I made an longitudinal incision over the fistula from its arterial anastomosis up to its axillary extent.  I carefully dissected the fistula away from its adjacent nerves.  Eventually this fistula was mobilized except the distal 3 cm of the fistula which was >6 mm in diameter with strong thrill.  The 6 cm segment just proximal to this was sclerotic and did  not dilate despite multiple maneuvers.  I felt that segment would ultimately doom this fistula without revision, so I planned on excision and reanastomosis.  I gave the patient 5000 units of Heparin intravenously to obtain anticoagulation.  I marked the fistula to help determine orientation.  After waiting 3 minutes, I clamped the fistula proximal and distal to this sclerotic segment.  I transected this segment.  Upon exploration of this segment it had a sclerotic lumen with sclerotic valves within this segment.  I then spatulated both ends of the fistula.  Unfortunately, despite my previous maneuvers, there did not appear to be adequate length to reanastomose the two ends of this fistula together.  Subsequently, I elected to use a 6 mm Goretex graft to replace the sclerotic segment.  I spatulated one end of the graft, and then sewed the fistula to the graft in an end-to-end fashion with a 6-0 Prolene.  I then tunneled from the antecubitum to axilla with a curved Gore tunneler.  I delivered the fistula through the metal tunnel, taking care to maintain orientation of the graft and fistula.  I pulled the fistula to tension in the tunnel.  I then turned my attention to the distal segment of fistula.  I cut off another 2 cm of the fistula that appeared sclerotic.  I then spatulated this end of the fistula.  The fistula wall was quite thickened but no frank sclerosis was noted.  The blood flow from the distal fistula was strong, so I did not think revision was necessary.  The graft was spatulated to facilitate an end-to-end anastomosis, adjusting the  length in the process.  I sewed the graft to the distal fistula with a running stitch of 5-0 Prolene.  Prior to completing this anastomosis, I backbled each end.  Bleeding was controlled with electrocautery and suture ligature.  I placed large pieces of thrombin and gelfoam in the wound and gave the patient 30 mg of Protamine to reverse anticoagulation.  After multiple  change outs of the thrombin and gelfoam, I washed out the surgical site, and there was no further bleeding.  The deep subcutaneous tissue was reapproximated with mattress sutures of 3-0 Vicryl to eliminate some of the dead space.  The superficial subcutaneous tissue was then reapproximated along the incision line with a running stitch of 3-0 Vicryl.  The skin was then reapproximated with a running subcuticular of 4-0 Monocryl.  The skin was then cleaned, dried, and reinforced with Dermabond.  There was a strong thrill in the composite graft-fistula at the end of the case.  There also was a palpable radial pulse at the end of the case.  The patient tolerated this procedure well.   COMPLICATIONS: none  CONDITION: stable  Leonides Sake, MD [facility] [place] Office: [phone] Pager: [phone]  05/31/2012, 1:54 PM

## 2012-05-31 NOTE — Transfer of Care (Signed)
Immediate Anesthesia Transfer of Care Note  Patient: [name]  Procedure(s) Performed: Procedure(s) with comments: BASCILIC VEIN TRANSPOSITION (Left) - Left 2nd Stage Basilic Vein Transposition with gortex graft revision using 77mmx10cm graft  Patient Location: PACU  Anesthesia Type:General  Level of Consciousness: awake, alert  and oriented  Airway & Oxygen Therapy: Patient Spontanous Breathing and Patient connected to nasal cannula oxygen  Post-op Assessment: Report given to PACU RN, Post -op Vital signs reviewed and stable and Patient moving all extremities X 4  Post vital signs: Reviewed and stable  Complications: No apparent anesthesia complications

## 2012-06-01 LAB — BASIC METABOLIC PANEL
CO2: 28 mEq/L (ref 19–32)
Calcium: 6.9 mg/dL — ABNORMAL LOW (ref 8.4–10.5)
Glucose, Bld: 89 mg/dL (ref 70–99)
Sodium: 137 mEq/L (ref 135–145)

## 2012-06-01 LAB — GLUCOSE, CAPILLARY
Glucose-Capillary: 123 mg/dL — ABNORMAL HIGH (ref 70–99)
Glucose-Capillary: 109 mg/dL — ABNORMAL HIGH (ref 70–99)

## 2012-06-01 MED ORDER — DOXERCALCIFEROL 4 MCG/2ML IV SOLN
2.0000 ug | INTRAVENOUS | Status: DC
  Administered 2012-06-01: 2 ug via INTRAVENOUS

## 2012-06-01 MED ORDER — INSULIN ASPART 100 UNIT/ML ~~LOC~~ SOLN
0.0000 [IU] | Freq: Three times a day (TID) | SUBCUTANEOUS | Status: DC
  Administered 2012-06-01: 1 [IU] via SUBCUTANEOUS

## 2012-06-01 MED ORDER — HEPARIN SODIUM (PORCINE) 1000 UNIT/ML DIALYSIS: 1000.0000 [IU] | INTRAMUSCULAR | Status: DC | PRN

## 2012-06-01 MED ORDER — SODIUM CHLORIDE 0.9 % IV SOLN: 100.0000 mL | INTRAVENOUS | Status: DC | PRN

## 2012-06-01 MED ORDER — NEPRO/CARBSTEADY PO LIQD: 237.0000 mL | ORAL | Status: DC | PRN

## 2012-06-01 MED ORDER — ALTEPLASE 2 MG IJ SOLR
2.0000 mg | Freq: Once | INTRAMUSCULAR | Status: DC | PRN
  Filled 2012-06-01: qty 2

## 2012-06-01 MED ORDER — PENTAFLUOROPROP-TETRAFLUOROETH EX AERO: 1.0000 "application " | INHALATION_SPRAY | CUTANEOUS | Status: DC | PRN

## 2012-06-01 MED ORDER — HYDROMORPHONE HCL PF 1 MG/ML IJ SOLN
INTRAMUSCULAR | Status: AC
  Filled 2012-06-01: qty 1

## 2012-06-01 MED ORDER — LIDOCAINE-PRILOCAINE 2.5-2.5 % EX CREA: 1.0000 "application " | TOPICAL_CREAM | CUTANEOUS | Status: DC | PRN

## 2012-06-01 MED ORDER — LIDOCAINE HCL (PF) 1 % IJ SOLN: 5.0000 mL | INTRAMUSCULAR | Status: DC | PRN

## 2012-06-01 MED ORDER — SODIUM CHLORIDE 0.9 % IV BOLUS (SEPSIS): 250.0000 mL | Freq: Once | INTRAVENOUS | Status: AC

## 2012-06-01 MED ORDER — DOXERCALCIFEROL 4 MCG/2ML IV SOLN
INTRAVENOUS | Status: AC
  Filled 2012-06-01: qty 2

## 2012-06-01 NOTE — Progress Notes (Signed)
UR COMPLETED  

## 2012-06-01 NOTE — Consult Note (Signed)
[facility] Renal Consultation Note  Indication for Consultation:  Management of ESRD/hemodialysis; anemia, hypertension/volume and secondary hyperparathyroidism  HPI: [name] is a 57 y.o. male with ESRD on dialysis on TTS at [facility] who had scheduled revision of his left basilic vein transposition with interposition graft by Dr. Leonides Sake of [facility] yesterday.  He was stable for discharge, but had no transportation, secondary to the bad weather, and will require dialysis today.  He has pain in his left upper arm, but otherwise has no complaints.  Dialysis Orders: Center: [facility] on TTS. EDW 64 kg  HD Bath 2K/2.25Ca  Time 4 hrs  Heparin 1500 U. Access Right IJ catheter  BFR 400 DFR 800  Hectorol 2 mcg IV/HD   Epogen 20,000 Units IV/HD  Venofer 100 mg on Thurs.  Past Medical History  Diagnosis Date  . Hypertension   . Diabetes mellitus without complication     on oral pills only  . ESRD (end stage renal disease) on dialysis since 2012  . Seizure disorder     questionable history of - will need to clarify with PCP  . Anemia, chronic disease   . CHF (congestive heart failure)     diastolic.  EF 60 - 65% per [facility] eco 11/2011  . Cocaine abuse     mentioned in notes from [place]  . Hepatitis C antibody test positive     was HIV negative, 02/28/12  . Hepatitis B core antibody positive     03/01/10  . Positive QuantiFERON-TB Gold test     11/2011  . Helicobacter pylori gastritis     not defined if this was treated  . Polyp of colon, adenomatous     May 2012.  Dr [name] in [place]  . Shortness of breath   . Hematochezia   . Seizures   . Headache   . Head injury, closed, with concussion   . Arthritis     shoulder  . History of blood transfusion    Past Surgical History  Procedure Laterality Date  . Shoulder surgery      right  . Knee surgery      left  . Bascilic vein transposition  03/07/2012    Procedure: BASCILIC VEIN TRANSPOSITION;   Surgeon: Fransisco Hertz, MD;  Location: [facility] OR;  Service: Vascular;  Laterality: Left;  First Stage  . Insertion of dialysis catheter      right chest   Family History  Problem Relation Age of Onset  . Diabetes Mother   . Hypertension Mother   . Stroke Mother   . Kidney failure Mother    Social History He smokes around five cigarettes a [name], as he has since he was 39.  He previously drank alcohol and used illicit drugs, including cocaine,, but not in many years.  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   Allergies  Allergen Reactions  . Reglan (Metoclopramide) Other (See Comments)    Tardive dyskinesia in 11/2011 in [place]   Prior to Admission medications   Medication Sig Start Date End Date Taking? Authorizing Provider  oxyCODONE (ROXICODONE) 5 MG immediate release tablet Take 1 tablet (5 mg total) by mouth every 6 (six) hours as needed for pain. 05/31/12   Dara Lords, PA   Labs:  Results for orders placed during the hospital encounter of 05/31/12 (from the past 48 hour(s))  POCT I-STAT 4, (NA,K, GLUC, HGB,HCT)  Status: Abnormal   Collection Time    05/31/12  7:58 AM      Result Value Range   Sodium 142  135 - 145 mEq/L   Potassium 4.3  3.5 - 5.1 mEq/L   Glucose, Bld 97  70 - 99 mg/dL   HCT 16.1 (*) 09.6 - 04.5 %   Hemoglobin 12.6 (*) 13.0 - 17.0 g/dL  SURGICAL PCR SCREEN     Status: None   Collection Time    05/31/12  8:43 AM      Result Value Range   MRSA, PCR NEGATIVE  NEGATIVE   Staphylococcus aureus NEGATIVE  NEGATIVE   Comment:            The Xpert SA Assay (FDA     approved for NASAL specimens     in patients over 90 years of age),     is one component of     a comprehensive surveillance     program.  Test performance has     been validated by [employer] for patients greater     than or equal to 55 year old.     It is not intended     to diagnose infection nor to     guide or  monitor treatment.  GLUCOSE, CAPILLARY     Status: None   Collection Time    05/31/12 10:13 AM      Result Value Range   Glucose-Capillary 78  70 - 99 mg/dL  GLUCOSE, CAPILLARY     Status: None   Collection Time    05/31/12  2:19 PM      Result Value Range   Glucose-Capillary 98  70 - 99 mg/dL  GLUCOSE, CAPILLARY     Status: None   Collection Time    05/31/12  5:04 PM      Result Value Range   Glucose-Capillary 79  70 - 99 mg/dL   Constitutional: negative for chills, fatigue, fevers and sweats Ears, nose, mouth, throat, and face: negative for earaches, hearing loss, hoarseness, nasal congestion and sore throat Respiratory: negative for cough, dyspnea on exertion, hemoptysis and sputum Cardiovascular: negative for chest pain, chest pressure/discomfort, dyspnea, orthopnea and palpitations Gastrointestinal: negative for abdominal pain, change in bowel habits, nausea and vomiting Genitourinary:negative, non-oliguric Musculoskeletal:negative for arthralgias, back pain and neck pain, positive for left upper arm pain s/p surgery Neurological: negative for dizziness, gait problems, headaches, paresthesia and weakness  Physical Exam: Filed Vitals:   06/01/12 0910  BP: 160/65  Pulse: 85  Temp: 99 F (37.2 C)  Resp: 17     General appearance: alert, cooperative and no distress Head: Normocephalic, without obvious abnormality, atraumatic Neck: no adenopathy, no carotid bruit, no JVD and supple, symmetrical, trachea midline Resp: clear to auscultation bilaterally Cardio: regular rate and rhythm, S1, S2 normal, no murmur, click, rub or gallop GI: soft, non-tender; bowel sounds normal; no masses,  no organomegaly Extremities: extremities normal, atraumatic, no cyanosis or edema, surgical incision site at medial left upper arm Neurologic: Grossly normal Dialysis Access: Right IJ catheter, left 2nd stage BVT   Assessment/Plan: 1. Revision with interposition graft of left BVT - yesterday  per Dr. Imogene Burn, ready for use in 2-4 wks. 2. ESRD -  HD on TTS @ [facility]; K 4.3.  HD today. 3. Hypertension/volume  - BP 160/65 most recently, yesterday's wt varied from 63.7 kg in AM to 70.4 kg last night; EDW 64 kg.  Check wt pre-HD.  4. Anemia  - Hgb 12.6 on outpatient Epogen 20,000 U per HD and Fe on Thurs; last admission 1/16-1/19 for anemia, received 3 U PRBCs, never had follow-up.. 5. Metabolic bone disease - on Hectorol 2 mcg per HD, no binders. 6. Hx diastolic heart failure  7. Hepatitis C 8. Hx seizures  [name],Sherill 06/01/2012, 10:11 AM   Attending Nephrologist: Delano Metz, MD  Patient seen and examined.  Agree with assessment and plan as above. HD today and then probably will be discharge if transportation can be arranged. Vinson Moselle  MD [phone] pgr    [phone] cell 06/01/2012, 3:12 PM

## 2012-06-01 NOTE — Procedures (Signed)
I was present at this dialysis session. I have reviewed the session itself and made appropriate changes.   Vinson Moselle, MD [employer] 06/01/2012, 3:13 PM

## 2012-06-01 NOTE — Progress Notes (Signed)
06/02/2012 patient c/o of dizziness at 1829. Vital at 1831 was 87/60. Dr Myra Gianotti was notified and orders were given for 250 normal saline bolus. Patient blood pressure was taken while sitting it was 111/55, heart rate 88. Also orders were given to d/c patient discharge for today. Biochemist, clinical.

## 2012-06-01 NOTE — Progress Notes (Signed)
Pt states he has difficulty obtaining medications. High risk for CM/SW consult.

## 2012-06-01 NOTE — Procedures (Deleted)
I was present at this dialysis session. I have reviewed the session itself and made appropriate changes.   Vinson Moselle, MD [employer] 06/01/2012, 3:18 PM

## 2012-06-01 NOTE — Progress Notes (Signed)
[facility] [place]  Daily Progress Note  Assessment/Planning: POD #1 s/p L 2nd BVT including revision with interpositiongraft   No steal syndrome.  Suspect BVT will be ready in 2-4 weeks.  Was ok to D/C yesterday but due to snow pt's transport could not come  Ok to D/C from medical viewpoint, issue again is transport.  Will need to see if Social Work can help with transport issue.  If pt remains overnight, will need to consult nephrology.  Subjective  - 1 Day Post-Op  Pain tolerable  Objective Filed Vitals:   05/31/12 2117 05/31/12 2229 06/01/12 0302 06/01/12 0500  BP: 176/70 158/80 171/74 166/83  Pulse: 80  80 80  Temp: 99.3 F (37.4 C)  98.3 F (36.8 C) 98.9 F (37.2 C)  TempSrc: Oral  Oral Oral  Resp: 16  17 16   Height:      Weight: 155 lb 3.3 oz (70.4 kg)     SpO2: 100%  98% 97%    Intake/Output Summary (Last 24 hours) at 06/01/12 0817 Last data filed at 06/01/12 0600  Gross per 24 hour  Intake   1493 ml  Output    100 ml  Net   1393 ml    PULM  CTAB CV  RRR GI  soft, NTND VASC  L arm strong thrill and bruit, no hematoma in left arm, warm hand with [facility]  Laboratory CBC    Component Value Date/Time   WBC 7.3 05/07/2012 0835   HGB 12.6* 05/31/2012 0758   HCT 37.0* 05/31/2012 0758   PLT 185 05/07/2012 0835    BMET    Component Value Date/Time   NA 142 05/31/2012 0758   K 4.3 05/31/2012 0758   CL 98 05/07/2012 0835   CO2 26 05/07/2012 0835   GLUCOSE 97 05/31/2012 0758   BUN 52* 05/07/2012 0835   CREATININE 6.74* 05/07/2012 0835   CALCIUM 7.5* 05/07/2012 0835   GFRNONAA 8* 05/07/2012 0835   GFRAA 10* 05/07/2012 0835    Leonides Sake, MD [facility] [place] Office: [phone] Pager: [phone]  06/01/2012, 8:17 AM

## 2012-06-01 NOTE — Clinical Social Work Note (Signed)
Patient medically stable for discharge home today and will need assistance with transport. CSW facilitating transport via cab. Patient will transport home after completing his dialysis treatment today.  Genelle Bal, MSW, LCSW [phone]

## 2012-06-02 LAB — CBC
MCH: 29.2 pg (ref 26.0–34.0)
MCV: 93.3 fL (ref 78.0–100.0)
Platelets: 172 10*3/uL (ref 150–400)
RBC: 3.56 MIL/uL — ABNORMAL LOW (ref 4.22–5.81)

## 2012-06-02 LAB — RENAL FUNCTION PANEL
CO2: 23 mEq/L (ref 19–32)
Calcium: 7.6 mg/dL — ABNORMAL LOW (ref 8.4–10.5)
Creatinine, Ser: 7.15 mg/dL — ABNORMAL HIGH (ref 0.50–1.35)
Glucose, Bld: 110 mg/dL — ABNORMAL HIGH (ref 70–99)

## 2012-06-02 LAB — GLUCOSE, CAPILLARY: Glucose-Capillary: 95 mg/dL (ref 70–99)

## 2012-06-02 NOTE — Progress Notes (Signed)
Discussed discharge instructions and medications with pt. Pt showed no barriers to discharge. IV removed. Tele removed. Assessment unchanged from morning. Pt awaiting ride from cab - pt has voucher.

## 2012-06-02 NOTE — Progress Notes (Signed)
[facility] [place]  Subjective  - POD #2 Revision of left basilic vein transposition with interposition graft.  Patient is doing well and has had dialysis while here in the hospital.    Objective 148/62 81 98.9 F (37.2 C) (Oral) 18 92%  Intake/Output Summary (Last 24 hours) at 06/02/12 0910 Last data filed at 06/01/12 1801  Gross per 24 hour  Intake      0 ml  Output   3892 ml  Net  -3892 ml    Left upper arm incision clean , dry and intact. Palpable thrill in fistula. Radial pulse intact no sign of steal.  Assessment/Planning: POD #2 Revision of left basilic vein transposition with interposition graft.   D/C home .   Clinton Gallant [facility] 06/02/2012 9:10 AM --  Laboratory Lab Results:  Recent Labs  05/31/12 0758 06/02/12 0835  WBC  --  5.9  HGB 12.6* 10.4*  HCT 37.0* 33.2*  PLT  --  172   BMET  Recent Labs  05/31/12 0758 06/01/12 0951  NA 142 137  K 4.3 4.8  CL  --  96  CO2  --  28  GLUCOSE 97 89  BUN  --  38*  CREATININE  --  8.83*  CALCIUM  --  6.9*    COAG Lab Results  Component Value Date   INR 1.16 05/05/2012   INR 1.05 05/04/2012   INR 1.15 03/02/2012   No results found for this basename: PTT    Antibiotics Anti-infectives   Start     Dose/Rate Route Frequency Ordered Stop   05/30/12 1431  cefUROXime (ZINACEF) 1.5 g in dextrose 5 % 50 mL IVPB     1.5 g 100 mL/hr over 30 Minutes Intravenous 30 min pre-op 05/30/12 1431 05/31/12 1103

## 2012-06-02 NOTE — Discharge Summary (Signed)
[facility] Discharge Summary   Patient ID:  [name] MRN: [id number] DOB/AGE: Jan 19, 1956 57 y.o.  Admit date: 05/31/2012 Discharge date: 06/02/2012 Date of Surgery: 05/31/2012 Surgeon: Surgeon(s): Fransisco Hertz, MD  Admission Diagnosis: End Stage Renal Disease  Discharge Diagnoses:  End Stage Renal Disease  Secondary Diagnoses: Past Medical History  Diagnosis Date  . Hypertension   . Diabetes mellitus without complication     on oral pills only  . ESRD (end stage renal disease) on dialysis since 2012  . Seizure disorder     questionable history of - will need to clarify with PCP  . Anemia, chronic disease   . CHF (congestive heart failure)     diastolic.  EF 60 - 65% per [facility] eco 11/2011  . Cocaine abuse     mentioned in notes from [place]  . Hepatitis C antibody test positive     was HIV negative, 02/28/12  . Hepatitis B core antibody positive     03/01/10  . Positive QuantiFERON-TB Gold test     11/2011  . Helicobacter pylori gastritis     not defined if this was treated  . Polyp of colon, adenomatous     May 2012.  Dr [name] in [place]  . Shortness of breath   . Hematochezia   . Seizures   . Headache   . Head injury, closed, with concussion   . Arthritis     shoulder  . History of blood transfusion     Procedure(s): BASCILIC VEIN TRANSPOSITION  Discharged Condition: good  HPI: [name] is a 57 y.o. male who presents with chief complaint: end stage renal disease. The patient presents today for Left 2nd stage BVT.   [name] currently on dialysis using IJ catheter.    Hospital Course:  [name] is a 57 y.o. male is S/P Left Procedure(s): BASCILIC VEIN TRANSPOSITION Extubated: POD # 0 Post-op wounds healing well Pt. Ambulating, voiding and taking PO diet without difficulty. Pt pain controlled with PO pain meds. Labs as below Complications:None  Consults:  Treatment Team:  Maree Krabbe, MD  Significant  Diagnostic Studies: CBC Lab Results  Component Value Date   WBC 5.9 06/02/2012   HGB 10.4* 06/02/2012   HCT 33.2* 06/02/2012   MCV 93.3 06/02/2012   PLT 172 06/02/2012    BMET    Component Value Date/Time   NA 137 06/01/2012 0951   K 4.8 06/01/2012 0951   CL 96 06/01/2012 0951   CO2 28 06/01/2012 0951   GLUCOSE 89 06/01/2012 0951   BUN 38* 06/01/2012 0951   CREATININE 8.83* 06/01/2012 0951   CALCIUM 6.9* 06/01/2012 0951   GFRNONAA 6* 06/01/2012 0951   GFRAA 7* 06/01/2012 0951   COAG Lab Results  Component Value Date   INR 1.16 05/05/2012   INR 1.05 05/04/2012   INR 1.15 03/02/2012     Disposition:  Discharge to :Home Discharge Orders   Future Appointments Provider Department Dept Phone   06/30/2012 1:00 PM Fransisco Hertz, MD [facility] [phone]   Future Orders Complete By Expires     Call MD for:  redness, tenderness, or signs of infection (pain, swelling, bleeding, redness, odor or green/yellow discharge around incision site)  As directed     Call MD for:  redness, tenderness, or signs of infection (pain, swelling, bleeding, redness, odor or green/yellow discharge around incision site)  As directed     Call MD for:  severe or increased pain, loss or decreased feeling  in affected limb(s)  As directed     Call MD for:  severe or increased pain, loss or decreased feeling  in affected limb(s)  As directed     Call MD for:  temperature >100.5  As directed     Call MD for:  temperature >100.5  As directed     Discharge patient  As directed     Comments:      Discharge pt to home    Driving Restrictions  As directed     Comments:      No driving for 24 hours and while taking pain medication.    Driving Restrictions  As directed     Comments:      No driving while taking narcotics.    Increase activity slowly  As directed     Comments:      Walk with assistance use walker or cane as needed    Lifting restrictions  As directed     Comments:      No  lifting for 4 weeks    Lifting restrictions  As directed     Comments:      No lifting for 2 weeks    Resume previous diet  As directed     Resume previous diet  As directed     may wash over wound with mild soap and water  As directed     may wash over wound with mild soap and water  As directed       @DISCHMEDS @ Verbal and written Discharge instructions given to the patient. Wound care per Discharge AVS Follow-up Information   Follow up with Nilda Simmer, MD In 4 weeks. (Office will call you to arrange your appt (sent))    Contact information:   [address] [place] [address] [phone]       Signed: Clinton Gallant [facility] 06/02/2012, 9:16 AM   Addendum  I have independently interviewed and examined the patient, and I agree with the physician assistant's discharge summary.  The patient had an eventful left second stage basilic vein transposition with interposition graft revision.  Unfortunately, the snowstorm prevented him from being discharged for the next day.  He was scheduled to discharge after hemodialysis on POD #1 but he became orthostatic after dialysis so he was kept another day and bolused fluid.  The patient will follow up in 4 weeks in the office.  Leonides Sake, MD [facility] [place] Office: [phone] Pager: [phone]  06/02/2012, 12:53 PM

## 2012-06-05 ENCOUNTER — Encounter (HOSPITAL_COMMUNITY): Payer: Self-pay | Admitting: Vascular Surgery

## 2012-06-29 ENCOUNTER — Encounter: Payer: Self-pay | Admitting: Vascular Surgery

## 2012-06-30 ENCOUNTER — Ambulatory Visit: Payer: Medicare Other | Admitting: Vascular Surgery

## 2012-07-06 ENCOUNTER — Encounter: Payer: Self-pay | Admitting: Vascular Surgery

## 2012-07-07 ENCOUNTER — Ambulatory Visit: Payer: Medicare Other | Admitting: Vascular Surgery

## 2012-07-14 ENCOUNTER — Ambulatory Visit: Payer: Medicare Other | Admitting: Vascular Surgery

## 2012-07-20 ENCOUNTER — Encounter: Payer: Self-pay | Admitting: Vascular Surgery

## 2012-07-21 ENCOUNTER — Ambulatory Visit: Payer: Medicare Other | Admitting: Vascular Surgery

## 2012-08-15 ENCOUNTER — Other Ambulatory Visit (HOSPITAL_COMMUNITY): Payer: Self-pay | Admitting: Nephrology

## 2012-08-15 DIAGNOSIS — N186 End stage renal disease: Secondary | ICD-10-CM

## 2012-08-18 ENCOUNTER — Ambulatory Visit (HOSPITAL_COMMUNITY): Admission: RE | Admit: 2012-08-18 | Payer: Medicare Other | Source: Ambulatory Visit

## 2012-08-23 ENCOUNTER — Ambulatory Visit (HOSPITAL_COMMUNITY)
Admission: RE | Admit: 2012-08-23 | Discharge: 2012-08-23 | Disposition: A | Discharge status: Home / Self Care | Payer: Medicare Other | Source: Ambulatory Visit | Attending: Nephrology | Admitting: Nephrology

## 2012-08-23 DIAGNOSIS — Z992 Dependence on renal dialysis: Secondary | ICD-10-CM | POA: Insufficient documentation

## 2012-08-23 DIAGNOSIS — Z9889 Other specified postprocedural states: Secondary | ICD-10-CM | POA: Insufficient documentation

## 2012-08-23 DIAGNOSIS — N186 End stage renal disease: Secondary | ICD-10-CM | POA: Insufficient documentation

## 2012-08-23 MED ORDER — CHLORHEXIDINE GLUCONATE 4 % EX LIQD
CUTANEOUS | Status: AC
  Filled 2012-08-23: qty 30

## 2012-08-23 NOTE — Procedures (Signed)
Successful removal of tunneled Rt IJ Permcath No complications

## 2012-11-07 ENCOUNTER — Encounter (HOSPITAL_COMMUNITY): Payer: Self-pay | Admitting: Emergency Medicine

## 2012-11-07 ENCOUNTER — Inpatient Hospital Stay (HOSPITAL_COMMUNITY)
Admission: EM | Admit: 2012-11-07 | Discharge: 2012-11-09 | DRG: 811 | Disposition: A | Discharge status: Home / Self Care | Payer: Medicare Other | Attending: Internal Medicine | Admitting: Internal Medicine

## 2012-11-07 DIAGNOSIS — E119 Type 2 diabetes mellitus without complications: Secondary | ICD-10-CM

## 2012-11-07 DIAGNOSIS — E8770 Fluid overload, unspecified: Secondary | ICD-10-CM

## 2012-11-07 DIAGNOSIS — Z992 Dependence on renal dialysis: Secondary | ICD-10-CM

## 2012-11-07 DIAGNOSIS — G4700 Insomnia, unspecified: Secondary | ICD-10-CM | POA: Diagnosis present

## 2012-11-07 DIAGNOSIS — I509 Heart failure, unspecified: Secondary | ICD-10-CM | POA: Diagnosis present

## 2012-11-07 DIAGNOSIS — K625 Hemorrhage of anus and rectum: Secondary | ICD-10-CM

## 2012-11-07 DIAGNOSIS — B1920 Unspecified viral hepatitis C without hepatic coma: Secondary | ICD-10-CM | POA: Diagnosis present

## 2012-11-07 DIAGNOSIS — R195 Other fecal abnormalities: Secondary | ICD-10-CM

## 2012-11-07 DIAGNOSIS — Z720 Tobacco use: Secondary | ICD-10-CM

## 2012-11-07 DIAGNOSIS — N2581 Secondary hyperparathyroidism of renal origin: Secondary | ICD-10-CM | POA: Diagnosis present

## 2012-11-07 DIAGNOSIS — D509 Iron deficiency anemia, unspecified: Secondary | ICD-10-CM | POA: Diagnosis present

## 2012-11-07 DIAGNOSIS — Z9119 Patient's noncompliance with other medical treatment and regimen: Secondary | ICD-10-CM

## 2012-11-07 DIAGNOSIS — D649 Anemia, unspecified: Secondary | ICD-10-CM

## 2012-11-07 DIAGNOSIS — Z91199 Patient's noncompliance with other medical treatment and regimen due to unspecified reason: Secondary | ICD-10-CM

## 2012-11-07 DIAGNOSIS — D500 Iron deficiency anemia secondary to blood loss (chronic): Principal | ICD-10-CM | POA: Diagnosis present

## 2012-11-07 DIAGNOSIS — E875 Hyperkalemia: Secondary | ICD-10-CM

## 2012-11-07 DIAGNOSIS — E876 Hypokalemia: Secondary | ICD-10-CM

## 2012-11-07 DIAGNOSIS — N186 End stage renal disease: Secondary | ICD-10-CM

## 2012-11-07 DIAGNOSIS — I120 Hypertensive chronic kidney disease with stage 5 chronic kidney disease or end stage renal disease: Secondary | ICD-10-CM | POA: Diagnosis present

## 2012-11-07 DIAGNOSIS — I10 Essential (primary) hypertension: Secondary | ICD-10-CM

## 2012-11-07 DIAGNOSIS — G40909 Epilepsy, unspecified, not intractable, without status epilepticus: Secondary | ICD-10-CM

## 2012-11-07 DIAGNOSIS — M129 Arthropathy, unspecified: Secondary | ICD-10-CM | POA: Diagnosis present

## 2012-11-07 DIAGNOSIS — K922 Gastrointestinal hemorrhage, unspecified: Secondary | ICD-10-CM

## 2012-11-07 DIAGNOSIS — F1410 Cocaine abuse, uncomplicated: Secondary | ICD-10-CM

## 2012-11-07 DIAGNOSIS — F17200 Nicotine dependence, unspecified, uncomplicated: Secondary | ICD-10-CM | POA: Diagnosis present

## 2012-11-07 DIAGNOSIS — D631 Anemia in chronic kidney disease: Secondary | ICD-10-CM | POA: Diagnosis present

## 2012-11-07 LAB — COMPREHENSIVE METABOLIC PANEL
Albumin: 3.2 g/dL — ABNORMAL LOW (ref 3.5–5.2)
Alkaline Phosphatase: 48 U/L (ref 39–117)
BUN: 17 mg/dL (ref 6–23)
Creatinine, Ser: 3.5 mg/dL — ABNORMAL HIGH (ref 0.50–1.35)
GFR calc Af Amer: 21 mL/min — ABNORMAL LOW (ref >90)
Glucose, Bld: 141 mg/dL — ABNORMAL HIGH (ref 70–99)
Potassium: 3.3 mEq/L — ABNORMAL LOW (ref 3.5–5.1)
Total Bilirubin: 0.2 mg/dL — ABNORMAL LOW (ref 0.3–1.2)
Total Protein: 7.3 g/dL (ref 6.0–8.3)

## 2012-11-07 LAB — URINALYSIS, ROUTINE W REFLEX MICROSCOPIC
Bilirubin Urine: NEGATIVE
Glucose, UA: 250 mg/dL — AB
Hgb urine dipstick: NEGATIVE
Ketones, ur: NEGATIVE mg/dL
Protein, ur: >300 mg/dL — AB
pH: 8.5 — ABNORMAL HIGH (ref 5.0–8.0)

## 2012-11-07 LAB — RAPID URINE DRUG SCREEN, HOSP PERFORMED
Cocaine: POSITIVE — AB
Opiates: NOT DETECTED

## 2012-11-07 LAB — LACTATE DEHYDROGENASE: LDH: 213 U/L (ref 94–250)

## 2012-11-07 LAB — CBC WITH DIFFERENTIAL/PLATELET
Basophils Absolute: 0.1 10*3/uL (ref 0.0–0.1)
Basophils Relative: 1 % (ref 0–1)
Eosinophils Absolute: 0.2 10*3/uL (ref 0.0–0.7)
Hemoglobin: 7.2 g/dL — ABNORMAL LOW (ref 13.0–17.0)
MCH: 28.1 pg (ref 26.0–34.0)
MCHC: 31.4 g/dL (ref 30.0–36.0)
Neutro Abs: 3.1 10*3/uL (ref 1.7–7.7)
Neutrophils Relative %: 63 % (ref 43–77)
Platelets: 236 10*3/uL (ref 150–400)
RDW: 15.5 % (ref 11.5–15.5)

## 2012-11-07 LAB — OCCULT BLOOD, POC DEVICE: Fecal Occult Bld: POSITIVE — AB

## 2012-11-07 LAB — GLUCOSE, CAPILLARY: Glucose-Capillary: 89 mg/dL (ref 70–99)

## 2012-11-07 LAB — RENAL FUNCTION PANEL
Albumin: 2.9 g/dL — ABNORMAL LOW (ref 3.5–5.2)
BUN: 24 mg/dL — ABNORMAL HIGH (ref 6–23)
Creatinine, Ser: 4.47 mg/dL — ABNORMAL HIGH (ref 0.50–1.35)
Glucose, Bld: 113 mg/dL — ABNORMAL HIGH (ref 70–99)
Phosphorus: 2.5 mg/dL (ref 2.3–4.6)

## 2012-11-07 LAB — PREPARE RBC (CROSSMATCH)

## 2012-11-07 LAB — URINE MICROSCOPIC-ADD ON

## 2012-11-07 MED ORDER — HYDRALAZINE HCL 100 MG PO TABS: 100.0000 mg | ORAL_TABLET | Freq: Two times a day (BID) | ORAL | Status: DC

## 2012-11-07 MED ORDER — PANTOPRAZOLE SODIUM 40 MG IV SOLR
40.0000 mg | Freq: Once | INTRAVENOUS | Status: AC
  Administered 2012-11-07: 40 mg via INTRAVENOUS
  Filled 2012-11-07: qty 40

## 2012-11-07 MED ORDER — ACETAMINOPHEN 650 MG RE SUPP: 650.0000 mg | Freq: Four times a day (QID) | RECTAL | Status: DC | PRN

## 2012-11-07 MED ORDER — CALCIUM ACETATE 667 MG PO CAPS
2001.0000 mg | ORAL_CAPSULE | Freq: Three times a day (TID) | ORAL | Status: DC
  Administered 2012-11-07: 2001 mg via ORAL
  Filled 2012-11-07 (×5): qty 3

## 2012-11-07 MED ORDER — HYDRALAZINE HCL 50 MG PO TABS
100.0000 mg | ORAL_TABLET | Freq: Two times a day (BID) | ORAL | Status: DC
  Administered 2012-11-07 – 2012-11-09 (×4): 100 mg via ORAL
  Filled 2012-11-07 (×5): qty 2

## 2012-11-07 MED ORDER — SODIUM CHLORIDE 0.9 % IV SOLN: 250.0000 mL | INTRAVENOUS | Status: DC | PRN

## 2012-11-07 MED ORDER — SORBITOL 70 % SOLN: 30.0000 mL | Freq: Every day | Status: DC | PRN

## 2012-11-07 MED ORDER — NEPRO/CARBSTEADY PO LIQD: 237.0000 mL | Freq: Three times a day (TID) | ORAL | Status: DC | PRN

## 2012-11-07 MED ORDER — CAMPHOR-MENTHOL 0.5-0.5 % EX LOTN
1.0000 "application " | TOPICAL_LOTION | Freq: Three times a day (TID) | CUTANEOUS | Status: DC | PRN
  Filled 2012-11-07: qty 222

## 2012-11-07 MED ORDER — POTASSIUM CHLORIDE CRYS ER 20 MEQ PO TBCR
20.0000 meq | EXTENDED_RELEASE_TABLET | Freq: Once | ORAL | Status: AC
  Administered 2012-11-07: 20 meq via ORAL
  Filled 2012-11-07: qty 1

## 2012-11-07 MED ORDER — ONDANSETRON HCL 4 MG/2ML IJ SOLN: 4.0000 mg | Freq: Four times a day (QID) | INTRAMUSCULAR | Status: DC | PRN

## 2012-11-07 MED ORDER — DARBEPOETIN ALFA-POLYSORBATE 150 MCG/0.3ML IJ SOLN: 150.0000 ug | INTRAMUSCULAR | Status: DC

## 2012-11-07 MED ORDER — DOCUSATE SODIUM 283 MG RE ENEM: 1.0000 | ENEMA | RECTAL | Status: DC | PRN

## 2012-11-07 MED ORDER — ACETAMINOPHEN 325 MG PO TABS
650.0000 mg | ORAL_TABLET | Freq: Four times a day (QID) | ORAL | Status: DC | PRN
  Administered 2012-11-07 – 2012-11-09 (×3): 650 mg via ORAL
  Filled 2012-11-07 (×3): qty 2

## 2012-11-07 MED ORDER — ONDANSETRON HCL 4 MG PO TABS: 4.0000 mg | ORAL_TABLET | Freq: Four times a day (QID) | ORAL | Status: DC | PRN

## 2012-11-07 MED ORDER — LEVETIRACETAM 500 MG PO TABS
1000.0000 mg | ORAL_TABLET | Freq: Two times a day (BID) | ORAL | Status: DC
  Administered 2012-11-07 – 2012-11-09 (×4): 1000 mg via ORAL
  Filled 2012-11-07 (×5): qty 2

## 2012-11-07 MED ORDER — RENA-VITE PO TABS
1.0000 | ORAL_TABLET | Freq: Every day | ORAL | Status: DC
  Administered 2012-11-07: 1 via ORAL
  Administered 2012-11-08: 21:00:00 via ORAL
  Filled 2012-11-07 (×3): qty 1

## 2012-11-07 MED ORDER — MULTI-VITAMIN/MINERALS PO TABS: 1.0000 | ORAL_TABLET | Freq: Every day | ORAL | Status: DC

## 2012-11-07 MED ORDER — HYDROXYZINE HCL 25 MG PO TABS
25.0000 mg | ORAL_TABLET | Freq: Three times a day (TID) | ORAL | Status: DC | PRN
  Filled 2012-11-07: qty 1

## 2012-11-07 MED ORDER — ALUM & MAG HYDROXIDE-SIMETH 200-200-20 MG/5ML PO SUSP: 30.0000 mL | Freq: Four times a day (QID) | ORAL | Status: DC | PRN

## 2012-11-07 MED ORDER — PANTOPRAZOLE SODIUM 40 MG PO TBEC
40.0000 mg | DELAYED_RELEASE_TABLET | Freq: Every day | ORAL | Status: DC
  Administered 2012-11-07 – 2012-11-09 (×2): 40 mg via ORAL
  Filled 2012-11-07 (×3): qty 1

## 2012-11-07 MED ORDER — FUROSEMIDE 10 MG/ML IJ SOLN
40.0000 mg | Freq: Once | INTRAMUSCULAR | Status: AC
  Administered 2012-11-07: 40 mg via INTRAVENOUS

## 2012-11-07 MED ORDER — POLYETHYLENE GLYCOL 3350 17 G PO PACK
17.0000 g | PACK | Freq: Every day | ORAL | Status: DC | PRN
  Filled 2012-11-07: qty 1

## 2012-11-07 MED ORDER — ZOLPIDEM TARTRATE 5 MG PO TABS
5.0000 mg | ORAL_TABLET | Freq: Every evening | ORAL | Status: DC | PRN
  Administered 2012-11-07 – 2012-11-08 (×2): 5 mg via ORAL
  Filled 2012-11-07 (×2): qty 1

## 2012-11-07 MED ORDER — RENA-VITE PO TABS
1.0000 | ORAL_TABLET | Freq: Every day | ORAL | Status: DC
  Filled 2012-11-07: qty 1

## 2012-11-07 MED ORDER — SENNA 8.6 MG PO TABS
1.0000 | ORAL_TABLET | Freq: Two times a day (BID) | ORAL | Status: DC
  Administered 2012-11-07 – 2012-11-09 (×3): 8.6 mg via ORAL
  Filled 2012-11-07 (×5): qty 1

## 2012-11-07 MED ORDER — DARBEPOETIN ALFA-POLYSORBATE 200 MCG/0.4ML IJ SOLN
200.0000 ug | INTRAMUSCULAR | Status: DC
  Filled 2012-11-07: qty 0.4

## 2012-11-07 MED ORDER — FOLIC ACID 1 MG PO TABS
1.0000 mg | ORAL_TABLET | Freq: Every day | ORAL | Status: DC
  Filled 2012-11-07: qty 1

## 2012-11-07 MED ORDER — CALCIUM CARBONATE 1250 MG/5ML PO SUSP: 500.0000 mg | Freq: Four times a day (QID) | ORAL | Status: DC | PRN

## 2012-11-07 MED ORDER — SORBITOL 70 % SOLN: 30.0000 mL | Status: DC | PRN

## 2012-11-07 MED ORDER — INSULIN ASPART 100 UNIT/ML ~~LOC~~ SOLN
0.0000 [IU] | Freq: Three times a day (TID) | SUBCUTANEOUS | Status: DC
  Administered 2012-11-08: 2 [IU] via SUBCUTANEOUS

## 2012-11-07 MED ORDER — DOXERCALCIFEROL 4 MCG/2ML IV SOLN
6.0000 ug | INTRAVENOUS | Status: DC
  Filled 2012-11-07: qty 4

## 2012-11-07 MED ORDER — SODIUM CHLORIDE 0.9 % IJ SOLN
3.0000 mL | Freq: Two times a day (BID) | INTRAMUSCULAR | Status: DC
  Administered 2012-11-08: 3 mL via INTRAVENOUS

## 2012-11-07 MED ORDER — SODIUM CHLORIDE 0.9 % IJ SOLN: 3.0000 mL | INTRAMUSCULAR | Status: DC | PRN

## 2012-11-07 MED ORDER — SODIUM CHLORIDE 0.9 % IJ SOLN
3.0000 mL | Freq: Two times a day (BID) | INTRAMUSCULAR | Status: DC
  Administered 2012-11-07 – 2012-11-08 (×2): 3 mL via INTRAVENOUS

## 2012-11-07 MED ORDER — ACETAMINOPHEN 325 MG PO TABS: 650.0000 mg | ORAL_TABLET | Freq: Four times a day (QID) | ORAL | Status: DC | PRN

## 2012-11-07 NOTE — H&P (Signed)
[facility] History and Physical  [name] RUE:[id number] DOB: 30-Dec-1955 DOA: 11/07/2012  Referring physician: Dr [name] PCP: [name] Maes, MD  Specialists: Nephrology: Dr [name]  Chief Complaint: Anemia/abn lab  HPI: [name] is a 57 y.o. male with history of hypertension, diabetes, end-stage renal disease on hemodialysis, questionable seizure disorder, history of anemia, history of hepatitis B for antibody positive, history of hepatitis C antibody positive, prior history of cocaine abuse, history of rectal bleed in January of 2014 seen by GI and felt to be multifactorial which resolved on its own without any further GI workup, history of colonic polyp in May of 2012 per Dr. Allena [name] of [place]'s records who presents to the ED with a hemoglobin of 6.2 noted at hemodialysis. On 10/26/2012 patient's hemoglobin was 9.7. Patient denies any overt gross GI bleed, no fever, no chills, no chest pain, no shortness of breath, no abdominal pain, no diarrhea, no constipation, no weakness, no fatigue. Patient does and does use of Goody's powder one time about a month ago. Patient was seen in the ED FOBT was positive and hemoglobin noted at 7.2. We were called to admit the patient for further evaluation and management.   Review of Systems: The patient denies anorexia, fever, weight loss,, vision loss, decreased hearing, hoarseness, chest pain, syncope, dyspnea on exertion, peripheral edema, balance deficits, hemoptysis, abdominal pain, melena, hematochezia, severe indigestion/heartburn, hematuria, incontinence, genital sores, muscle weakness, suspicious skin lesions, transient blindness, difficulty walking, depression, unusual weight change, abnormal bleeding, enlarged lymph nodes, angioedema, and breast masses.   Past Medical History  Diagnosis Date  . Hypertension   . Diabetes mellitus without complication     on oral pills only  . ESRD (end stage renal disease) on dialysis since  2012  . Seizure disorder     questionable history of - will need to clarify with PCP  . Anemia, chronic disease   . CHF (congestive heart failure)     diastolic.  EF 60 - 65% per [facility] eco 11/2011  . Cocaine abuse     mentioned in notes from [place]  . Hepatitis C antibody test positive     was HIV negative, 02/28/12  . Hepatitis B core antibody positive     03/01/10  . Positive QuantiFERON-TB Gold test     11/2011  . Helicobacter pylori gastritis     not defined if this was treated  . Polyp of colon, adenomatous     May 2012.  Dr [name] in [place]  . Shortness of breath   . Hematochezia   . Seizures   . Headache(784.0)   . Head injury, closed, with concussion   . Arthritis     shoulder  . History of blood transfusion    Past Surgical History  Procedure Laterality Date  . Shoulder surgery      right  . Knee surgery      left  . Bascilic vein transposition  03/07/2012    Procedure: BASCILIC VEIN TRANSPOSITION;  Surgeon: [name] Hertz, MD;  Location: [facility] OR;  Service: Vascular;  Laterality: Left;  First Stage  . Insertion of dialysis catheter      right chest  . Bascilic vein transposition Left 05/31/2012    Procedure: BASCILIC VEIN TRANSPOSITION;  Surgeon: [name] Hertz, MD;  Location: [facility] OR;  Service: Vascular;  Laterality: Left;  Left 2nd Stage Basilic Vein Transposition with gortex graft revision using 12mmx10cm graft   Social History:  reports that he has been smoking  Cigarettes.  He has a 3 pack-year smoking history. He has never used smokeless tobacco. He reports that  drinks alcohol. He reports that he uses illicit drugs.  Allergies  Allergen Reactions  . Reglan (Metoclopramide) Other (See Comments)    Tardive dyskinesia in 11/2011 in [place]    Family History  Problem Relation Age of Onset  . Diabetes Mother   . Hypertension Mother   . Stroke Mother   . Kidney failure Mother     Prior to Admission medications   Medication Sig Start Date End Date  Taking? Authorizing Provider  calcium acetate (PHOSLO) 667 MG capsule Take 2,001 mg by mouth 3 (three) times daily with meals.   Yes Historical Provider, MD  ethyl chloride spray Apply 1 application topically as needed.   Yes Historical Provider, MD  folic acid (FOLVITE) 1 MG tablet Take 1 mg by mouth daily.   Yes Historical Provider, MD  hydrALAZINE (APRESOLINE) 100 MG tablet Take 100 mg by mouth 2 (two) times daily.   Yes Historical Provider, MD  levETIRAcetam (KEPPRA) 1000 MG tablet Take 1,000 mg by mouth 2 (two) times daily.   Yes Historical Provider, MD  Multiple Vitamins-Minerals (MULTIVITAMIN WITH MINERALS) tablet Take 1 tablet by mouth daily.   Yes Historical Provider, MD  pantoprazole (PROTONIX) 40 MG tablet Take 40 mg by mouth daily.   Yes Historical Provider, MD   Physical Exam: Filed Vitals:   11/07/12 1509 11/07/12 1515 11/07/12 1524 11/07/12 1643  BP:  163/61 178/67 188/85  Pulse:  78  83  Temp: 98.9 F (37.2 C)  98.6 F (37 C) 98.5 F (36.9 C)  TempSrc: Oral  Oral Oral  Resp:  16 24 16   SpO2:  98% 99% 98%     General: Well-developed well-nourished in no acute cardiopulmonary distress.   Eyes: Pupils equal round reactive to light and accommodation. Extraocular movements intact.  ENT: Oropharynx is clear, no lesions, no exudates.  Neck: Supple with no lymphadenopathy. No JVD.  Cardiovascular: Regular rate rhythm no murmurs rubs or gallops. No JVD. No lower extremity edema.  Respiratory: Clear to auscultation bilaterally. No wheezes, no crackles, no rhonchi.  Abdomen: Soft, nontender, nondistended, positive bowel sounds.  Skin: No rashes or lesions.  Musculoskeletal: 5/5 BUE strength, 5/5 BLE strength  Psychiatric: Normal mood. Normal affect. Fair insight. Fair judgment.  Neurologic: Alert and oriented x3. Cranial nerves II through XII are grossly intact. No focal deficits.  Labs on Admission:  Basic Metabolic Panel:  Recent Labs Lab 11/07/12 1300  NA  140  K 3.3*  CL 98  CO2 37*  GLUCOSE 141*  BUN 17  CREATININE 3.50*  CALCIUM 8.9   Liver Function Tests:  Recent Labs Lab 11/07/12 1300  AST 18  ALT 10  ALKPHOS 48  BILITOT 0.2*  PROT 7.3  ALBUMIN 3.2*   No results found for this basename: LIPASE, AMYLASE,  in the last 168 hours No results found for this basename: AMMONIA,  in the last 168 hours CBC:  Recent Labs Lab 11/07/12 1207  WBC 5.0  NEUTROABS 3.1  HGB 7.2*  HCT 22.9*  MCV 89.5  PLT 236   Cardiac Enzymes: No results found for this basename: CKTOTAL, CKMB, CKMBINDEX, TROPONINI,  in the last 168 hours  BNP (last 3 results) No results found for this basename: PROBNP,  in the last 8760 hours CBG: No results found for this basename: GLUCAP,  in the last 168 hours  Radiological Exams on Admission: No results  found.  EKG: None  Assessment/Plan Principal Problem:   Anemia Active Problems:   Hypertension   Diabetes mellitus without complication   ESRD (end stage renal disease) on dialysis   Seizure disorder   Hyperphosphatemia   Hypocalcemia   Tobacco abuse   Hypokalemia   #1 anemia Questionable etiology. Patient had an EGD done May of 2012 showed a small hiatal hernia. EGD in 2011 showed gastritis. Colonoscopy of May of 2012 to the 6 mm adenomatous sigmoid polyp. Patient denies any overt gross GI bleed. Patient endorses the use of Goody's powder last use about a month ago. FOBT was positive per ED physician. Hemoglobin currently 7.2. Patient be transfused 2 units of packed red blood cells per ED physician. Will check an anemia panel. Follow H&H. Place on a PPI. Patient was supposed to followup with hematology as outpatient for evaluation however that was not done. Will consult with GI for further evaluation and management.  #2 end-stage renal disease on hemodialysis Tuesday Thursday Saturday Will consult with nephrology.  #3 hypertension Continue home regimen of hydralazine.  #4 history of  seizure Patient denies any history of seizures however is on Keppra. Resume home regimen of Keppra.  #5 hypokalemia Replete.  #6 tobacco abuse Tobacco cessation. Place on a nicotine patch.  #7 well-controlled diabetes mellitus Check a hemoglobin A1c. Last hemoglobin A1c in November of 2014 was 5.2. Patient is not on any diabetic medications and likely diet controlled. Follow CBGs. Sliding scale insulin.  #8 prophylaxis PPI for GI prophylaxis. SCDs for DVT prophylaxis.   Code Status: Full Family Communication: Updated patient, no family present.  Disposition Plan: Admit to telemetry.  Time spent: 65 mins  [facility] Pager [phone]  If 7PM-7AM, please contact night-coverage [email or web address] Password [facility] 11/07/2012, 5:12 PM

## 2012-11-07 NOTE — ED Provider Notes (Signed)
History    CSN: [id number] Arrival date & time 11/07/12  1143  First MD Initiated Contact with Patient 11/07/12 1152     Chief Complaint  Patient presents with  . Abnormal Lab    Hgb 6.1   (Consider location/radiation/quality/duration/timing/severity/associated sxs/prior Treatment) Patient is a 57 y.o. male presenting with weakness. The history is provided by the patient (the pt was sent from dialysis for anemia,  he has dropped his hg from 9.7 on july 10th to 6.2 today from the dialysis lab).  Weakness This is a new problem. The current episode started more than 1 week ago. The problem occurs constantly. The problem has not changed since onset.Pertinent negatives include no chest pain, no abdominal pain and no headaches. Nothing aggravates the symptoms. Nothing relieves the symptoms.   Past Medical History  Diagnosis Date  . Hypertension   . Diabetes mellitus without complication     on oral pills only  . ESRD (end stage renal disease) on dialysis since 2012  . Seizure disorder     questionable history of - will need to clarify with PCP  . Anemia, chronic disease   . CHF (congestive heart failure)     diastolic.  EF 60 - 65% per [facility] eco 11/2011  . Cocaine abuse     mentioned in notes from [place]  . Hepatitis C antibody test positive     was HIV negative, 02/28/12  . Hepatitis B core antibody positive     03/01/10  . Positive QuantiFERON-TB Gold test     11/2011  . Helicobacter pylori gastritis     not defined if this was treated  . Polyp of colon, adenomatous     May 2012.  Dr [name] in [place]  . Shortness of breath   . Hematochezia   . Seizures   . Headache(784.0)   . Head injury, closed, with concussion   . Arthritis     shoulder  . History of blood transfusion    Past Surgical History  Procedure Laterality Date  . Shoulder surgery      right  . Knee surgery      left  . Bascilic vein transposition  03/07/2012    Procedure: BASCILIC VEIN  TRANSPOSITION;  Surgeon: Fransisco Hertz, MD;  Location: [facility] OR;  Service: Vascular;  Laterality: Left;  First Stage  . Insertion of dialysis catheter      right chest  . Bascilic vein transposition Left 05/31/2012    Procedure: BASCILIC VEIN TRANSPOSITION;  Surgeon: Fransisco Hertz, MD;  Location[facility] OR;  Service: Vascular;  Laterality: Left;  Left 2nd Stage Basilic Vein Transposition with gortex graft revision using 42mmx10cm graft   Family History  Problem Relation Age of Onset  . Diabetes Mother   . Hypertension Mother   . Stroke Mother   . Kidney failure Mother    History  Substance Use Topics  . Smoking status: Current Every Day Smoker -- 0.20 packs/day for 15 years    Types: Cigarettes  . Smokeless tobacco: Never Used  . Alcohol Use: Yes     Comment: pt does not remember the last time he drank.    Review of Systems  Constitutional: Negative for appetite change and fatigue.  HENT: Negative for congestion, sinus pressure and ear discharge.   Eyes: Negative for discharge.  Respiratory: Negative for cough.   Cardiovascular: Negative for chest pain.  Gastrointestinal: Negative for abdominal pain and diarrhea.  Genitourinary: Negative for frequency and  hematuria.  Musculoskeletal: Negative for back pain.  Skin: Negative for rash.  Neurological: Positive for weakness. Negative for seizures and headaches.  Psychiatric/Behavioral: Negative for hallucinations.    Allergies  Reglan  Home Medications   Current Outpatient Rx  Name  Route  Sig  Dispense  Refill  . calcium acetate (PHOSLO) 667 MG capsule   Oral   Take 2,001 mg by mouth 3 (three) times daily with meals.         Marland Kitchen ethyl chloride spray   Topical   Apply 1 application topically as needed.         . folic acid (FOLVITE) 1 MG tablet   Oral   Take 1 mg by mouth daily.         . hydrALAZINE (APRESOLINE) 100 MG tablet   Oral   Take 100 mg by mouth 2 (two) times daily.         Marland Kitchen levETIRAcetam (KEPPRA) 1000 MG  tablet   Oral   Take 1,000 mg by mouth 2 (two) times daily.         . Multiple Vitamins-Minerals (MULTIVITAMIN WITH MINERALS) tablet   Oral   Take 1 tablet by mouth daily.         . pantoprazole (PROTONIX) 40 MG tablet   Oral   Take 40 mg by mouth daily.          BP 178/67  Pulse 78  Temp(Src) 98.6 F (37 C) (Oral)  Resp 24  SpO2 99% Physical Exam  Constitutional: [name] oriented to person, place, and time. He appears well-developed.  HENT:  Head: Normocephalic.  Eyes: Conjunctivae and EOM are normal. No scleral icterus.  Neck: Neck supple. No thyromegaly present.  Cardiovascular: Normal rate and regular rhythm.  Exam reveals no gallop and no friction rub.   No murmur heard. Pulmonary/Chest: No stridor. He has no wheezes. He has no rales. He exhibits no tenderness.  Abdominal: He exhibits no distension. There is no tenderness. There is no rebound.  Genitourinary: Rectum normal.  Dark stool not black.  Hem pos  Musculoskeletal: Normal range of motion. He exhibits no edema.  Lymphadenopathy:    He has no cervical adenopathy.  Neurological: [name] oriented to person, place, and time. Coordination normal.  Skin: No rash noted. No erythema.  Psychiatric: He has a normal mood and affect. His behavior is normal.    ED Course  Procedures (including critical care time) Labs Reviewed  CBC WITH DIFFERENTIAL - Abnormal; Notable for the following:    RBC 2.56 (*)    Hemoglobin 7.2 (*)    HCT 22.9 (*)    All other components within normal limits  COMPREHENSIVE METABOLIC PANEL - Abnormal; Notable for the following:    Potassium 3.3 (*)    CO2 37 (*)    Glucose, Bld 141 (*)    Creatinine, Ser 3.50 (*)    Albumin 3.2 (*)    Total Bilirubin 0.2 (*)    GFR calc non Af Amer 18 (*)    GFR calc Af Amer 21 (*)    All other components within normal limits  OCCULT BLOOD, POC DEVICE - Abnormal; Notable for the following:    Fecal Occult Bld POSITIVE (*)    All other components  within normal limits  FERRITIN  FOLATE  IRON AND TIBC  VITAMIN B12  TYPE AND SCREEN  PREPARE RBC (CROSSMATCH)   No results found. 1. Anemia     MDM  Benny Lennert, MD 11/07/12 [phone]

## 2012-11-07 NOTE — ED Notes (Signed)
Per [facility] pt came from dialysis center where labs today showed Hbg 6.2. No distress noted. Pt ambulatory on scene.

## 2012-11-07 NOTE — ED Notes (Signed)
Pt denies any issues with starting bld transfusion. Pt speaking in full sentences. Denies pain. No redness/warmth to IV site. Pt in nad, skin warm and dry, resp e/u. No reaction suspected.

## 2012-11-07 NOTE — Consult Note (Signed)
[facility] Renal Consultation Note    Indication for Consultation:  Management of ESRD/hemodialysis; anemia, hypertension/volume and secondary hyperparathyroidism  HPI: [name] is a 57 y.o. male ESRD patient (TTS HD) with past medical history significant for hypertension and seizure disorder who upon presenting to his regular hemodialysis session today, was found to have hemoglobin of 6.2 and symptomatic with weakness for about a week. His hemoglobin has trended down from 9.7 as recently 7/10. Here, his hemoglobin is 7.2 with positive FOBT in the ED.  He will be admitted for transfusion and further evaluation. We are consulted to manage his anemia and hemodialysis requirements.   He had dialysis today per his normal schedule and states there were no issues. At the time of this encounter, his blood pressure is elevated in the 170s. [name] resting in bed, receiving number 1 of 2 units PRBCs ordered. He endorses weakness, insomnia and hunger only. He denies pain, dyspnea, nausea, vomiting or diarrhea. No blood in stool, urine and no xs loss at HD.  Past Medical History  Diagnosis Date  . Hypertension   . Diabetes mellitus without complication     on oral pills only  . ESRD (end stage renal disease) on dialysis since 2012  . Seizure disorder     questionable history of - will need to clarify with PCP  . Anemia, chronic disease   . CHF (congestive heart failure)     diastolic.  EF 60 - 65% per [facility] eco 11/2011  . Cocaine abuse     mentioned in notes from [place]  . Hepatitis C antibody test positive     was HIV negative, 02/28/12  . Hepatitis B core antibody positive     03/01/10  . Positive QuantiFERON-TB Gold test     11/2011  . Helicobacter pylori gastritis     not defined if this was treated  . Polyp of colon, adenomatous     May 2012.  Dr [name] in [place]  . Shortness of breath   . Hematochezia   . Seizures   . Headache(784.0)   . Head injury,  closed, with concussion   . Arthritis     shoulder  . History of blood transfusion    Past Surgical History  Procedure Laterality Date  . Shoulder surgery      right  . Knee surgery      left  . Bascilic vein transposition  03/07/2012    Procedure: BASCILIC VEIN TRANSPOSITION;  Surgeon: Fransisco Hertz, MD;  Location: [facility] OR;  Service: Vascular;  Laterality: Left;  First Stage  . Insertion of dialysis catheter      right chest  . Bascilic vein transposition Left 05/31/2012    Procedure: BASCILIC VEIN TRANSPOSITION;  Surgeon: Fransisco Hertz, MD;  Location: [facility] OR;  Service: Vascular;  Laterality: Left;  Left 2nd Stage Basilic Vein Transposition with gortex graft revision using 31mmx10cm graft   Family History  Problem Relation Age of Onset  . Diabetes Mother   . Hypertension Mother   . Stroke Mother   . Kidney failure Mother    Social History:  reports that he has been smoking Cigarettes.  He has a 3 pack-year smoking history. He has never used smokeless tobacco. He reports that  drinks alcohol. He reports that he uses illicit drugs. Allergies  Allergen Reactions  . Reglan (Metoclopramide) Other (See Comments)    Tardive dyskinesia in 11/2011 in [place]   Prior to Admission medications  Medication Sig Start Date End Date Taking? Authorizing Provider  calcium acetate (PHOSLO) 667 MG capsule Take 2,001 mg by mouth 3 (three) times daily with meals.   Yes Historical Provider, MD  ethyl chloride spray Apply 1 application topically as needed.   Yes Historical Provider, MD  folic acid (FOLVITE) 1 MG tablet Take 1 mg by mouth daily.   Yes Historical Provider, MD  hydrALAZINE (APRESOLINE) 100 MG tablet Take 100 mg by mouth 2 (two) times daily.   Yes Historical Provider, MD  levETIRAcetam (KEPPRA) 1000 MG tablet Take 1,000 mg by mouth 2 (two) times daily.   Yes Historical Provider, MD  Multiple Vitamins-Minerals (MULTIVITAMIN WITH MINERALS) tablet Take 1 tablet by mouth daily.   Yes Historical  Provider, MD  pantoprazole (PROTONIX) 40 MG tablet Take 40 mg by mouth daily.   Yes Historical Provider, MD   No current facility-administered medications for this encounter.   Current Outpatient Prescriptions  Medication Sig Dispense Refill  . calcium acetate (PHOSLO) 667 MG capsule Take 2,001 mg by mouth 3 (three) times daily with meals.      Marland Kitchen ethyl chloride spray Apply 1 application topically as needed.      . folic acid (FOLVITE) 1 MG tablet Take 1 mg by mouth daily.      . hydrALAZINE (APRESOLINE) 100 MG tablet Take 100 mg by mouth 2 (two) times daily.      Marland Kitchen levETIRAcetam (KEPPRA) 1000 MG tablet Take 1,000 mg by mouth 2 (two) times daily.      . Multiple Vitamins-Minerals (MULTIVITAMIN WITH MINERALS) tablet Take 1 tablet by mouth daily.      . pantoprazole (PROTONIX) 40 MG tablet Take 40 mg by mouth daily.       Labs: Basic Metabolic Panel:  Recent Labs Lab 11/07/12 1300  NA 140  K 3.3*  CL 98  CO2 37*  GLUCOSE 141*  BUN 17  CREATININE 3.50*  CALCIUM 8.9   Liver Function Tests:  Recent Labs Lab 11/07/12 1300  AST 18  ALT 10  ALKPHOS 48  BILITOT 0.2*  PROT 7.3  ALBUMIN 3.2*   CBC:  Recent Labs Lab 11/07/12 1207  WBC 5.0  NEUTROABS 3.1  HGB 7.2*  HCT 22.9*  MCV 89.5  PLT 236   Cardiac Enzymes: No results found for this basename: CKTOTAL, CKMB, CKMBINDEX, TROPONINI,  in the last 168 hours CBG: No results found for this basename: GLUCAP,  in the last 168 hours INR: @labcntip (inr:3)@ Iron Studies: No results found for this basename: IRON, TIBC, TRANSFERRIN, FERRITIN,  in the last 72 hours Studies/Results: No results found.  ROS: Weakness, difficulty sleeping. All other systems negative except as above.   Physical Exam: Filed Vitals:   11/07/12 1347 11/07/12 1509 11/07/12 1515 11/07/12 1524  BP: 181/79  163/61 178/67  Pulse: 81  78   Temp:  98.9 F (37.2 C)  98.6 F (37 C)  TempSrc:  Oral  Oral  Resp: 18  16 24   SpO2: 97%  98% 99%      General: Well developed, well nourished, in no acute distress. Head: Normocephalic, atraumatic, sclera non-icteric, mucus membranes are moist Fundi with scarring Neck: Supple. JVD not elevated. Lungs: Clear bilaterally to auscultation without wheezes, rales, or rhonchi. Breathing is unlabored.Bs diminished Heart: RRR with S1 S2. No murmurs, rubs, or gallops appreciated. Gr 2-3/6 SEM Abdomen: Soft, non-tender, non-distended with normoactive bowel sounds. No rebound/guarding. No obvious abdominal masses.Liver down 4 cm M-S:  Strength and tone appear  normal for age. Lower extremities:without edema or ischemic changes, no open wounds  Neuro: Alert and oriented X 3. Moves all extremities spontaneously. Psych:  Responds to questions appropriately with a normal affect. Dialysis Access: LUA AVF + bruit  Dialysis Orders: Center: [facility]  on TTS. EDW 61 kgHD Bath 2K/2.25Ca  Time 4:00 Heparin 1500. Access L BVT BFR 400 DFR A1.5    Hectorol 6 mcg IV/HD Epogen 11000   Units IV/HD  Venofer  0 Recent Labs: Hgb 7.0 < 7.8 < 9.7. Phos 5.1, Tsat 25%, PTH 246.1 in April  Assessment/Plan: 1. Anemia  - Hgb in 6's reported at op clinic via  POCT, 7.2 here. Unit 1 of 2 transfusing today. Received Epogen 11000 u at outpatient dialysis today. Will order Aranesp 200 per pharmacy protocol with next HD. Iron studies and further work-up pending. Follow CBC. No obvious loss sources. ? Could he be hemolyzing 2. ESRD -  TTS. Had HD today on regular schedule. K+ 3.3 as to be expected.  Next HD 7/24. 3. Hypertension/volume  - SBPs elevated. Home med is hydralazine 100 mg BID except on HD days. Op post weights variable and is under EDW at times. He signs off frequently secondary to cramping last 30 min of tx but body usually gives up more fluid than asked. EDW is ok.  4. Metabolic bone disease -  Ca 9.5 corrected. Last PTH 246. Continue phoslo and Vit D. 5. Nutrition - Renal diet, multivitamin 6. Seizure disorder - on  Keppra  Enis Slipper [facility] Pager [phone] 11/07/2012, 3:31 PM . I have seen and examined this patient and agree with the plan of care seen , examined and discussed with patient . Eval for loss and hemolysis.  Check K after blooe d  .  Benjamin Casanas L 11/07/2012, 5:12 PM

## 2012-11-07 NOTE — Consult Note (Signed)
[facility] Consult: 11:14 AM 11/08/2012   Referring Provider: Dr Janee Morn Primary Care Physician:  Dyke Maes, MD Primary Gastroenterologist:  Dr. [name] in [place], [name] 11/14, [name] 1/1.    Reason for Consultation:  Acute on chronic anemia.   HPI: [name] is a 57 y.o. male.  Has ESRD and relocated to [facility] from [place] in November 2013. Hep C positive.   Seen as inpt in November 2013 by Dr [name] for normocytic anemia. Records from GI Dr Allena Katz reviewed and no further workup pursued.  Had bone marrow biopsy 03/07/13 ordered by Dr Alcide Evener but this was never performed due to surgical conflict and pt refusal. .   Seen 1/16 by Dr [name] for inpt consult. Re: limited rectal bleeding, anemia.  Has never followed up with Dr Alcide Evener as advised and has missed 43% of scheduled follow up appointments, most of these with IR/vascular.  Has yet to follow up with Dr Imogene Burn after 05/2012 staged basilic vein transposition and graft revision (was due to follow up in end of march)  Sent to inpt admission from HD yesterday with Hgb of 6.2, one weak of weakness.  He has heme + stool.  No melena, no gross bleeding.  No nausea, anorexia.  No dyspnea, no chest pain.  Does admit to wekness, easy fatigue.  On 7/10 the Hgb was 9.7.  Note that Hgb was 5.3 in 02/2013: transfused 4 units RBC.  6.6 in 05/06/12 when he got 3 units, 12.6 - 10.4  in 05/2012,  Hgb today after 2 units is 12.6.    Pt getting regular doses of Epogen at dialysis, the dose was just increased in last 24 hours.   Pt 's tox screen yesterday is + for cocaine.   Records from [place] previously reviewed and note: .  Hgb 02/10/12 was 5.7, 11/5: 7.3  [name] hepatitis B core AB positive  [name] hepatitis C Ab positive. Repeatedly positive for cocaine. Multiple admits in [place] before relocating to [place] The synopsis of his EGDs and colonoscopies listed below.  Dr Allena Katz considered repeat  colonoscopy, pt refused.  He also considered capsule endoscopy.  This has never been done    Past Medical History  Diagnosis Date  . Hypertension   . Diabetes mellitus without complication     on oral pills only  . ESRD (end stage renal disease) on dialysis since 2012  . Seizure disorder     questionable history of - will need to clarify with PCP  . Anemia, chronic disease   . CHF (congestive heart failure)     diastolic.  EF 60 - 65% per [facility] eco 11/2011  . Cocaine abuse     mentioned in notes from [place] [facility] [place]  . Hepatitis C antibody test positive     was HIV negative, 02/28/12  . Hepatitis B core antibody positive     03/01/10  . Positive QuantiFERON-TB Gold test     11/2011  . Helicobacter pylori gastritis     not defined if this was treated  . Polyp of colon, adenomatous     May 2012.  Dr [name] in [place]  . Shortness of breath   . Hematochezia   . Seizures   . Headache(784.0)   . Head injury, closed, with concussion   . Arthritis     shoulder  . History of blood transfusion     Past Surgical History  Procedure Laterality Date  . Shoulder surgery      right  .  Knee surgery      left  . Bascilic vein transposition  03/07/2012    Procedure: BASCILIC VEIN TRANSPOSITION;  Surgeon: Fransisco Hertz, MD;  Location: [facility] OR;  Service: Vascular;  Laterality: Left;  First Stage  . Insertion of dialysis catheter      right chest  . Bascilic vein transposition Left 05/31/2012    Procedure: BASCILIC VEIN TRANSPOSITION;  Surgeon: Fransisco Hertz, MD;  [facility] OR;  Service: Vascular;  Laterality: Left;  Left 2nd Stage Basilic Vein Transposition with gortex graft revision using 7mmx10cm graft    Prior to Admission medications   Medication Sig Start Date End Date Taking? Authorizing Provider  calcium acetate (PHOSLO) 667 MG capsule Take 2,001 mg by mouth 3 (three) times daily with meals.   Yes Historical Provider, MD  ethyl chloride spray Apply 1 application topically as  needed.   Yes Historical Provider, MD  folic acid (FOLVITE) 1 MG tablet Take 1 mg by mouth daily.   Yes Historical Provider, MD  hydrALAZINE (APRESOLINE) 100 MG tablet Take 100 mg by mouth 2 (two) times daily.   Yes Historical Provider, MD  levETIRAcetam (KEPPRA) 1000 MG tablet Take 1,000 mg by mouth 2 (two) times daily.   Yes Historical Provider, MD  Multiple Vitamins-Minerals (MULTIVITAMIN WITH MINERALS) tablet Take 1 tablet by mouth daily.   Yes Historical Provider, MD  pantoprazole (PROTONIX) 40 MG tablet Take 40 mg by mouth daily.   Yes Historical Provider, MD    Scheduled Meds: . [START ON 11/09/2012] darbepoetin (ARANESP) injection - DIALYSIS  200 mcg Intravenous Q Thu-HD  . [START ON 11/09/2012] doxercalciferol  6 mcg Intravenous Q T,Th,Sa-HD  . [START ON 11/09/2012] ferric gluconate (FERRLECIT/NULECIT) IV  125 mg Intravenous Q T,Th,Sa-HD  . hydrALAZINE  100 mg Oral BID  . insulin aspart  0-9 Units Subcutaneous TID WC  . levETIRAcetam  1,000 mg Oral BID  . multivitamin  1 tablet Oral QHS  . pantoprazole  40 mg Oral Daily  . senna  1 tablet Oral BID  . sodium chloride  3 mL Intravenous Q12H  . sodium chloride  3 mL Intravenous Q12H   Infusions:   PRN Meds: sodium chloride, acetaminophen, acetaminophen, calcium carbonate (dosed in mg elemental calcium), camphor-menthol, docusate sodium, feeding supplement (NEPRO CARB STEADY), hydrOXYzine, ondansetron (ZOFRAN) IV, ondansetron, polyethylene glycol, sodium chloride, sorbitol, zolpidem   Allergies as of 11/07/2012 - Review Complete 11/07/2012  Allergen Reaction Noted  . Reglan (metoclopramide) Other (See Comments) 03/02/2012    Family History  Problem Relation Age of Onset  . Diabetes Mother   . Hypertension Mother   . Stroke Mother   . Kidney failure Mother     History   Social History  . Marital Status: Single    Spouse Name: N/A    Number of Children: N/A  . Years of Education: N/A   Occupational History  . Not on  file.   Social History Main Topics  . Smoking status: Current Every Day Smoker -- 0.20 packs/day for 15 years    Types: Cigarettes  . Smokeless tobacco: Never Used  . Alcohol Use: Yes     Comment: pt does not remember the last time he drank.  . Drug Use: Yes     Comment: smokes crack-cocaine. 05/30/12- last time 3 months ago  . Sexually Active: Not Currently   Other Topics Concern  . Not on file   Social History Narrative  . No narrative on file  REVIEW OF SYSTEMS: Constitutional:  Hungry, weak ENT:  No nose bleed Pulm:  No cough or dyspnea CV:  No palpitations, no chest pain.  No LE edema GU:  Still urinates GI:  Per HPI Heme:  Per HPI.    Transfusions:  Per HPI Neuro:  Insomnia.  Derm:  No sores, rash, itching Endocrine:  No sweats, chills, increased thirst Immunization:  Not recorded in Epic Travel:  None.    PHYSICAL EXAM: Vital signs in last 24 hours: Temp:  [97.3 F (36.3 C)-99.8 F (37.7 C)] 98.3 F (36.8 C) (07/23 0900) Pulse Rate:  [78-86] 86 (07/23 0900) Resp:  [16-24] 20 (07/23 0900) BP: (140-189)/(61-93) 180/93 mmHg (07/23 0900) SpO2:  [91 %-99 %] 91 % (07/23 0900)  General: unhealthy but not acutely ill looking AAM.  Looks old for age Head:  No swelling or asymmetry  Eyes:  No conj pallor Ears:  Not  HOH  Nose:  No discharge or congestion Mouth:  Few rotten teeth remain.  Most teeth are gone.  MM pink, moist.  No exudate or lesions Neck:  No JVD or mass Lungs:  Clear.  Unlabored breathing Heart: RRR.  No MRG Abdomen:  NT, ND.  No mass or HSM.   Rectal: not done.   Musc/Skeltl: no joint swelling of deformity. Extremities:  No pedal or UE edema  Neurologic:  Oriented x 3.  No tremor Skin:  No rash or sores Tattoos:  None seen Nodes:  No cervical adenopathy   Psych:  Cooperative, laconic.   LAB RESULTS:  Recent Labs  11/07/12 1207 11/08/12 0558  WBC 5.0 5.6  HGB 7.2* 8.3*  HCT 22.9* 25.6*  PLT 236 219   BMET Lab Results   Component Value Date   NA 139 11/08/2012   NA 139 11/07/2012   NA 140 11/07/2012   K 3.9 11/08/2012   K 3.7 11/07/2012   K 3.3* 11/07/2012   CL 100 11/08/2012   CL 100 11/07/2012   CL 98 11/07/2012   CO2 31 11/08/2012   CO2 33* 11/07/2012   CO2 37* 11/07/2012   GLUCOSE 113* 11/08/2012   GLUCOSE 113* 11/07/2012   GLUCOSE 141* 11/07/2012   BUN 35* 11/08/2012   BUN 24* 11/07/2012   BUN 17 11/07/2012   CREATININE 5.57* 11/08/2012   CREATININE 4.47* 11/07/2012   CREATININE 3.50* 11/07/2012   CALCIUM 8.8 11/08/2012   CALCIUM 8.8 11/07/2012   CALCIUM 8.9 11/07/2012   LFT  Recent Labs  11/07/12 1300 11/07/12 2012 11/08/12 0558  PROT 7.3  --  6.6  ALBUMIN 3.2* 2.9* 2.8*  AST 18  --  14  ALT 10  --  9  ALKPHOS 48  --  45  BILITOT 0.2*  --  0.3   PT/INR Lab Results  Component Value Date   INR 1.16 05/05/2012   INR 1.05 05/04/2012   INR 1.15 03/02/2012   Drugs of Abuse     Component Value Date/Time   LABOPIA NONE DETECTED 11/07/2012 1903   COCAINSCRNUR POSITIVE* 11/07/2012 1903   LABBENZ NONE DETECTED 11/07/2012 1903   AMPHETMU NONE DETECTED 11/07/2012 1903   THCU NONE DETECTED 11/07/2012 1903   LABBARB NONE DETECTED 11/07/2012 1903     RADIOLOGY STUDIES: No results found.  ENDOSCOPIC STUDIES: In  [place]:  Enteroscopy 03/26/2011 Dr Samuella Cota. Negative study. He refused colonoscopy. Capsule endo entertained  EGD May 2012: Small HH,  EGD 2011: gastritis  Colonoscopy May 2012: 6mm adenomatous sigmoid polyp   IMPRESSION: *  Acute on chronic anemia.  Hgb drop noted but not inconsistent with #s in 2012 - 2014. On chronic Aranesp at dialysis.    FOB +.  Extensive array of studies in 2012, noted just above. Only test he's not had is capsule endo *  08/2010 adenomatous sigmoid polyp.  *  ESRD *  Non compliance with vascular and hematology follow up.  Probably needs to see hematology again. *  Hep C positive in [place].  No diagnoses of cirrhosis.  *  Substance, cocaine, abuse and habituation.   Current tox screen positive.   PLAN: *  Per Dr Leone Payor. ? Inpt capsule endo tomorrow?  Pt does not seem reliable to follow up for outpt study. EGD tomorrow?    LOS: 1 day   Jennye Moccasin  11/08/2012, 11:14 AM Pager: [phone]     [facility] Attending  I have also seen and assessed the patient and agree with the above note.  Anemia undoubtedly multifactorial. He has had extensive GI evaluation. Doubt significant GI issue, though could have AVM's and iron supplement usual Tx. Capsule endoscopy reasonable but he is not inclined to pursue further endoscopic testing. Will follow-up again tomorrow - Dr. Arbutus Leas contacting hematology.  Iva Boop, MD, [facility] [phone] (pager) 11/08/2012 6:05 PM

## 2012-11-08 DIAGNOSIS — N186 End stage renal disease: Secondary | ICD-10-CM

## 2012-11-08 DIAGNOSIS — R195 Other fecal abnormalities: Secondary | ICD-10-CM | POA: Diagnosis present

## 2012-11-08 DIAGNOSIS — D649 Anemia, unspecified: Secondary | ICD-10-CM

## 2012-11-08 DIAGNOSIS — F1410 Cocaine abuse, uncomplicated: Secondary | ICD-10-CM

## 2012-11-08 DIAGNOSIS — F17200 Nicotine dependence, unspecified, uncomplicated: Secondary | ICD-10-CM

## 2012-11-08 LAB — HAPTOGLOBIN: Haptoglobin: 42 mg/dL — ABNORMAL LOW (ref 45–215)

## 2012-11-08 LAB — COMPREHENSIVE METABOLIC PANEL
ALT: 9 U/L (ref 0–53)
AST: 14 U/L (ref 0–37)
Albumin: 2.8 g/dL — ABNORMAL LOW (ref 3.5–5.2)
Alkaline Phosphatase: 45 U/L (ref 39–117)
Chloride: 100 mEq/L (ref 96–112)
Potassium: 3.9 mEq/L (ref 3.5–5.1)
Sodium: 139 mEq/L (ref 135–145)
Total Bilirubin: 0.3 mg/dL (ref 0.3–1.2)

## 2012-11-08 LAB — CBC
MCH: 28.4 pg (ref 26.0–34.0)
MCV: 87.7 fL (ref 78.0–100.0)
Platelets: 219 10*3/uL (ref 150–400)
RDW: 16.4 % — ABNORMAL HIGH (ref 11.5–15.5)

## 2012-11-08 LAB — URINE CULTURE
Colony Count: NO GROWTH
Culture: NO GROWTH

## 2012-11-08 LAB — FERRITIN: Ferritin: 48 ng/mL (ref 22–322)

## 2012-11-08 LAB — HEMOGLOBIN A1C
Hgb A1c MFr Bld: 5 % (ref <5.7)
Mean Plasma Glucose: 97 mg/dL (ref <117)

## 2012-11-08 LAB — TYPE AND SCREEN
ABO/RH(D): A POS
Antibody Screen: NEGATIVE
Unit division: 0

## 2012-11-08 LAB — IRON AND TIBC: TIBC: 263 ug/dL (ref 215–435)

## 2012-11-08 LAB — GLUCOSE, CAPILLARY
Glucose-Capillary: 90 mg/dL (ref 70–99)
Glucose-Capillary: 157 mg/dL — ABNORMAL HIGH (ref 70–99)

## 2012-11-08 LAB — VITAMIN B12: Vitamin B-12: 536 pg/mL (ref 211–911)

## 2012-11-08 MED ORDER — SODIUM CHLORIDE 0.9 % IV SOLN
125.0000 mg | INTRAVENOUS | Status: DC
  Filled 2012-11-08: qty 10

## 2012-11-08 NOTE — Progress Notes (Addendum)
[facility] PROGRESS NOTE  Kjell Brannen ZOX:[id number] DOB: 11/17/1955 DOA: 11/07/2012 PCP: Dyke Maes, MD  Assessment/Plan: #1 anemia  Questionable etiology. Patient had an EGD done May of 2012 showed a small hiatal hernia. EGD in 2011 showed gastritis. Colonoscopy of May of 2012 to the 6 mm adenomatous sigmoid polyp. Patient denies any overt gross GI bleed. Patient endorses the use of Goody's powder last use about a month ago.  -FOBT was positive per ED physician.  -Hemoglobin currently 8.2 after 2 units PRBC.  -Iron saturation 16% -Concerned about low-grade intramedullary hemolysis with borderline low heptoglobin, normal LDH -The patient has seen hematology, Dr. Truett Perna in past-->I will try to reconsult him   -Follow H&H. Place on a PPI.  -Patient was supposed to followup with hematology as outpatient for evaluation however that was not done -appreciate GI eval #2 end-stage renal disease on hemodialysis Tuesday Thursday Saturday  -appreciate nephrology #3 hypertension  Continue home regimen of hydralazine.  -May need to add additional agent if does not improve with hemodialysis #4 history of seizure  Patient denies any history of seizures however is on Keppra. Resume home regimen of Keppra.  #5 hypokalemia  Replete.  #6 tobacco abuse  Tobacco cessation. Place on a nicotine patch.  #7 well-controlled diabetes mellitus  Check a hemoglobin A1c--5.0 -Discontinue Accu-Cheks #8 prophylaxis  PPI for GI prophylaxis. SCDs for DVT prophylaxis.   Family Communication:   Pt at beside Disposition Plan:   Home when medically stable         Subjective: Patient is feeling well. He denies any fevers, chills, chest discomfort, shortness of breath, nausea, vomiting, diarrhea, abdominal pain, dizziness.  Objective: Filed Vitals:   11/08/12 0030 11/08/12 0129 11/08/12 0900 11/08/12 1438  BP: 182/84 178/75 180/93 178/93  Pulse: 82 83 86 84  Temp: 99.2 F (37.3 C)  99.7 F (37.6 C) 98.3 F (36.8 C) 98.1 F (36.7 C)  TempSrc: Oral Oral Oral Oral  Resp: 18 18 20 18   SpO2:  98% 91% 95%    Intake/Output Summary (Last 24 hours) at 11/08/12 1731 Last data filed at 11/08/12 1439  Gross per 24 hour  Intake 1552.5 ml  Output      0 ml  Net 1552.5 ml   Weight change:  Exam:   General:  Pt is alert, follows commands appropriately, not in acute distress  HEENT: No icterus, No thrush,  /AT  Cardiovascular: RRR, S1/S2, no rubs, no gallops  Respiratory: CTA bilaterally, no wheezing, no crackles, no rhonchi  Abdomen: Soft/+BS, non tender, non distended, no guarding  Extremities: No edema, No lymphangitis, No petechiae, No rashes, no synovitis  Data Reviewed: Basic Metabolic Panel:  Recent Labs Lab 11/07/12 1300 11/07/12 2011 11/07/12 2012 11/08/12 0558  NA 140  --  139 139  K 3.3*  --  3.7 3.9  CL 98  --  100 100  CO2 37*  --  33* 31  GLUCOSE 141*  --  113* 113*  BUN 17  --  24* 35*  CREATININE 3.50*  --  4.47* 5.57*  CALCIUM 8.9  --  8.8 8.8  MG  --  2.0  --   --   PHOS  --   --  2.5 2.9   Liver Function Tests:  Recent Labs Lab 11/07/12 1300 11/07/12 2012 11/08/12 0558  AST 18  --  14  ALT 10  --  9  ALKPHOS 48  --  45  BILITOT 0.2*  --  0.3  PROT 7.3  --  6.6  ALBUMIN 3.2* 2.9* 2.8*   No results found for this basename: LIPASE, AMYLASE,  in the last 168 hours No results found for this basename: AMMONIA,  in the last 168 hours CBC:  Recent Labs Lab 11/07/12 1207 11/08/12 0558  WBC 5.0 5.6  NEUTROABS 3.1  --   HGB 7.2* 8.3*  HCT 22.9* 25.6*  MCV 89.5 87.7  PLT 236 219   Cardiac Enzymes: No results found for this basename: CKTOTAL, CKMB, CKMBINDEX, TROPONINI,  in the last 168 hours BNP: No components found with this basename: POCBNP,  CBG:  Recent Labs Lab 11/07/12 1733 11/08/12 0808 11/08/12 1142 11/08/12 1622  GLUCAP 89 89 90 157*    No results found for this or any previous visit (from the past  240 hour(s)).   Scheduled Meds: . [START ON 11/09/2012] darbepoetin (ARANESP) injection - DIALYSIS  200 mcg Intravenous Q Thu-HD  . [START ON 11/09/2012] doxercalciferol  6 mcg Intravenous Q T,Th,Sa-HD  . [START ON 11/09/2012] ferric gluconate (FERRLECIT/NULECIT) IV  125 mg Intravenous Q T,Th,Sa-HD  . hydrALAZINE  100 mg Oral BID  . insulin aspart  0-9 Units Subcutaneous TID WC  . levETIRAcetam  1,000 mg Oral BID  . multivitamin  1 tablet Oral QHS  . pantoprazole  40 mg Oral Daily  . senna  1 tablet Oral BID  . sodium chloride  3 mL Intravenous Q12H  . sodium chloride  3 mL Intravenous Q12H   Continuous Infusions:    Earma Nicolaou, DO  [facility] Pager [phone]  If 7PM-7AM, please contact night-coverage [email or web address] Password [facility] 11/08/2012, 5:31 PM   LOS: 1 day

## 2012-11-08 NOTE — Progress Notes (Signed)
[facility] Progress Note  Subjective:   Feels fine. "I'm just hungry." Refusing clear liquid diet this a.m.  Objective Filed Vitals:   11/07/12 2330 11/07/12 2338 11/08/12 0030 11/08/12 0129  BP: 182/77  182/84 178/75  Pulse: 81  82 83  Temp: 98.9 F (37.2 C)  99.2 F (37.3 C) 99.7 F (37.6 C)  TempSrc: Oral  Oral Oral  Resp: 16  18 18   SpO2:  98%  98%   Physical Exam General: Alert, cooperative, NAD   Heart: RRR, no murmur or rub Lungs: CTA bilaterally. No wheezes, rales, rhonchi Abdomen: Soft, NT, normal BS Extremities: No LE edema Dialysis Access: LUA AVF + bruit  Outpatient Dialysis ([facility])  EDW 61 kg   Bath 2K/2.25Ca    4hrs     Heparin 1500    L BVT Hectorol 6 mcg    Epogen 11000 Units      Venofer 0  Recent Labs: Hgb 7.0 < 7.8 < 9.7. Phos 5.1, Tsat 25%, PTH 246.1 in April. Binder is phoslo 3 ac  Assessment/Plan: 1. Anemia -  Hgb 8.3 s/p 2 units PRBCs on 7/22. FOBT+ in ED. Tsat 16% with ferritin of 48. Received Epogen 11000 u at outpatient dialysis on 7/22.  Aranesp 200 and start IV Fe load with next HD. Follow CBC. GI consult pending per primary. 2. ESRD - TTS.  Next HD 7/24. K+3.9 after 20 mEq repletion last evening. Would not replete further. 3. Hypertension/volume - SBPs remain elevated 170s-180s on hydralazine 100 mg BID. Similar pattern seen in op dialysis center records. Op post weights variable and is under EDW at times. He signs off frequently secondary to cramping last 30 min of tx but body usually gives up more fluid than asked. EDW is ok.  4. Metabolic bone disease - Ca 9.8 corrected. Last PTH 246. Phos 2.9 here. Hold phoslo for now. Cont Vit D. Renal panel 5. Nutrition - On clears for now. Renal diet when appropriate, multivitamin 6. Seizure disorder - on Keppra 7. Hep C+ / hx cocaine abuse, tox screen +  Clydie Braun E. Thad Ranger [facility] Pager [phone] 11/08/2012,8:13 AM  LOS: 1 day   Patient seen and  examined.  Agree with assessment and plan as above. Vinson Moselle  MD Pager [phone]    Cell  [phone] 11/08/2012, 4:00 PM    Additional Objective Labs: Basic Metabolic Panel:  Recent Labs Lab 11/07/12 1300 11/07/12 2012 11/08/12 0558  NA 140 139 139  K 3.3* 3.7 3.9  CL 98 100 100  CO2 37* 33* 31  GLUCOSE 141* 113* 113*  BUN 17 24* 35*  CREATININE 3.50* 4.47* 5.57*  CALCIUM 8.9 8.8 8.8  PHOS  --  2.5 2.9   Liver Function Tests:  Recent Labs Lab 11/07/12 1300 11/07/12 2012 11/08/12 0558  AST 18  --  14  ALT 10  --  9  ALKPHOS 48  --  45  BILITOT 0.2*  --  0.3  PROT 7.3  --  6.6  ALBUMIN 3.2* 2.9* 2.8*   No results found for this basename: LIPASE, AMYLASE,  in the last 168 hours CBC:  Recent Labs Lab 11/07/12 1207 11/08/12 0558  WBC 5.0 5.6  NEUTROABS 3.1  --   HGB 7.2* 8.3*  HCT 22.9* 25.6*  MCV 89.5 87.7  PLT 236 219   Blood Culture    Component Value Date/Time   SDES URINE, CLEAN CATCH 03/03/2012 1822   SPECREQUEST NONE  03/03/2012 1822   CULT NO GROWTH 5 DAYS 03/02/2012 0949   REPTSTATUS 03/05/2012 FINAL 03/03/2012 1822    Cardiac Enzymes: No results found for this basename: CKTOTAL, CKMB, CKMBINDEX, TROPONINI,  in the last 168 hours CBG:  Recent Labs Lab 11/07/12 1733 11/08/12 0808  GLUCAP 89 89   Iron Studies:  Recent Labs  11/07/12 2012  IRON 41*  TIBC 263  FERRITIN 48   @lablastinr3 @ Studies/Results: No results found. Medications:   . calcium acetate  2,001 mg Oral TID WC  . [START ON 11/09/2012] darbepoetin (ARANESP) injection - DIALYSIS  200 mcg Intravenous Q Thu-HD  . [START ON 11/09/2012] doxercalciferol  6 mcg Intravenous Q T,Th,Sa-HD  . hydrALAZINE  100 mg Oral BID  . insulin aspart  0-9 Units Subcutaneous TID WC  . levETIRAcetam  1,000 mg Oral BID  . multivitamin  1 tablet Oral QHS  . pantoprazole  40 mg Oral Daily  . senna  1 tablet Oral BID  . sodium chloride  3 mL Intravenous Q12H  . sodium chloride  3 mL  Intravenous Q12H

## 2012-11-08 NOTE — Progress Notes (Signed)
UR COMPLETED  

## 2012-11-09 ENCOUNTER — Encounter (HOSPITAL_COMMUNITY)
Admission: EM | Disposition: A | Discharge status: Home / Self Care | Payer: Self-pay | Source: Home / Self Care | Attending: Internal Medicine

## 2012-11-09 ENCOUNTER — Encounter (HOSPITAL_COMMUNITY): Payer: Self-pay | Admitting: General Practice

## 2012-11-09 LAB — CBC
Hemoglobin: 8.2 g/dL — ABNORMAL LOW (ref 13.0–17.0)
MCHC: 32.3 g/dL (ref 30.0–36.0)
WBC: 6.3 10*3/uL (ref 4.0–10.5)

## 2012-11-09 LAB — RENAL FUNCTION PANEL
CO2: 28 mEq/L (ref 19–32)
Calcium: 9 mg/dL (ref 8.4–10.5)
Chloride: 99 mEq/L (ref 96–112)
GFR calc Af Amer: 8 mL/min — ABNORMAL LOW (ref >90)
GFR calc non Af Amer: 7 mL/min — ABNORMAL LOW (ref >90)
Glucose, Bld: 124 mg/dL — ABNORMAL HIGH (ref 70–99)
Potassium: 4.7 mEq/L (ref 3.5–5.1)
Sodium: 137 mEq/L (ref 135–145)

## 2012-11-09 LAB — GLUCOSE, CAPILLARY
Glucose-Capillary: 119 mg/dL — ABNORMAL HIGH (ref 70–99)
Glucose-Capillary: 168 mg/dL — ABNORMAL HIGH (ref 70–99)

## 2012-11-09 SURGERY — EGD (ESOPHAGOGASTRODUODENOSCOPY)
Anesthesia: Moderate Sedation

## 2012-11-09 NOTE — Clinical Documentation Improvement (Signed)
THIS DOCUMENT IS NOT A PERMANENT PART OF THE MEDICAL RECORD  Please update your documentation with the medical record to reflect your response to this query. If you need help knowing how to do this please call [phone].  11/09/12  Dear Dr. Arbutus Leas Marton Redwood  In an effort to better capture your patient's severity of illness, reflect appropriate length of stay and utilization of resources, a review of the patient medical record has revealed the following indicators.    Based on your clinical judgment, please clarify and document in a progress note and/or discharge summary the clinical condition associated with the following supporting information:  In responding to this query please exercise your independent judgment.  The fact that a query is asked, does not imply that any particular answer is desired or expected.  Possible Clinical Conditions?   " Acute Blood Loss Anemia   " Chronic blood loss anemia   " Other Condition________________  " Cannot Clinically Determine    Supporting Information:  Risk Factors: (As per notes)  "ESRD,DM,CHF, HepB, HepC"  Signs and Symptoms (As per notes) "His hemoglobin has trended down from 9.7 as recently 7/10. Here, his  hemoglobin is 7.2 with positive FOBT in the ED. "  Diagnostics: "On 7/10 the Hgb was 9.7. & 6.6 in 05/06/12 when he got 3 units, 12.6 - 10.4 in 05/2012,   Hgb on admit:"Sent to inpt admission from HD yesterday with Hgb of 6.2, one weak of weakness."  Hgb after TX:"Hgb today after 2 units is 12.6"  Treatments:  Transfusion: 2 Units PRBCs  Serial H&H monitoring  Medications (As per notes)  "On chronic Aranesp"   Reviewed:  no additional documentation provided NO ANSWER  Thank You,  Nevin Bloodgood RN, BSN, CCDS Clinical Documentation Specialist: [phone] [phone] Health Information Management [place]

## 2012-11-09 NOTE — Progress Notes (Signed)
     [facility] Daily Rounding Note 11/09/2012, 9:59 AM  SUBJECTIVE:       No complaints. No SOB, not dizzy. Dialysis not completed due to venous infiltration.  OBJECTIVE:         Vital signs in last 24 hours:    Temp:  [97.9 F (36.6 C)-98.8 F (37.1 C)] 98.1 F (36.7 C) (07/24 0803) Pulse Rate:  [80-84] 80 (07/24 0803) Resp:  [18] 18 (07/24 0803) BP: (152-186)/(66-93) 172/76 mmHg (07/24 0803) SpO2:  [95 %-98 %] 97 % (07/24 0803) Weight:  [62.415 kg (137 lb 9.6 oz)] 62.415 kg (137 lb 9.6 oz) (07/23 2108) Last BM Date: 11/08/12 General:  Looks unwell chronically Did not reexamine pt.  Neuro/Psych:  Drowsy, alert.  Not confused.   Intake/Output from previous day: 07/23 0701 - 07/24 0700 In: 850 [P.O.:840] Out: -   Intake/Output this shift: Total I/O In: 120 [P.O.:120] Out: -   Lab Results:  Recent Labs  11/07/12 1207 11/08/12 0558 11/09/12 0825  WBC 5.0 5.6 6.3  HGB 7.2* 8.3* 8.2*  HCT 22.9* 25.6* 25.4*  PLT 236 219 223   BMET  Recent Labs  11/07/12 2012 11/08/12 0558 11/09/12 0731  NA 139 139 137  K 3.7 3.9 4.7  CL 100 100 99  CO2 33* 31 28  GLUCOSE 113* 113* 124*  BUN 24* 35* 57*  CREATININE 4.47* 5.57* 7.81*  CALCIUM 8.8 8.8 9.0   LFT  Recent Labs  11/07/12 1300 11/07/12 2012 11/08/12 0558 11/09/12 0731  PROT 7.3  --  6.6  --   ALBUMIN 3.2* 2.9* 2.8* 2.9*  AST 18  --  14  --   ALT 10  --  9  --   ALKPHOS 48  --  45  --   BILITOT 0.2*  --  0.3  --     ASSESMENT: * Acute on chronic anemia. Hgb drop noted but not inconsistent with #s in 2012 - 2014. On chronic Aranesp at dialysis. FOB +. Extensive array of studies in 2012. Only test he's not had is capsule endo which is unlikely to change management if we did find AVMs.  Pt not inclined to under go capsule study.  Hematology evaluation requested. Was to have bone marrow bx in past but ended up refusing this when it delayed his discharge.  *  ESRD.  TTS dialysis.  *  Cocaine + tox screen      PLAN: *  Will sign off.  Follow hematology recs   LOS: 2 days   Jennye Moccasin  11/09/2012, 9:59 AM Pager: [phone]  Agree  Iva Boop, MD, [facility] [phone] (pager) 11/09/2012 7:44 PM

## 2012-11-09 NOTE — Discharge Summary (Addendum)
Physician Discharge Summary  [name] GNF:[id number] DOB: March 14, 1956 DOA: 11/07/2012  PCP: Dyke Maes, MD  Admit date: 11/07/2012 Discharge date: 11/09/2012  Recommendations for Outpatient Follow-up:  1. Please obtain BMP to evaluate electrolytes and kidney function 2. Please also check CBC to evaluate Hg and Hct levels every 2 weeks x 3 to ensure stability. 3. Followup with hematology, Dr. Mancel Bale in one month  Discharge Diagnoses:  Principal Problem:   Anemia Active Problems:   Hypertension   Diabetes mellitus without complication   ESRD (end stage renal disease) on dialysis   Seizure disorder   Hyperphosphatemia   Hypocalcemia   Tobacco abuse   Hypokalemia   Heme + stool #1 anemia  Likely multifactorial component of "chronic disease ", chronic renal insufficiency, iron deficiency, and gastrointestinal blood loss (chronic blood loss anemia)  Patient had an EGD done May of 2012 showed a small hiatal hernia. EGD in 2011 showed gastritis. Colonoscopy of May of 2012 to the 6 mm adenomatous sigmoid polyp. Patient denies any overt gross GI bleed. Patient endorses the use of Goody's powder last use about a month ago.  -FOBT was positive per ED physician.  -Hemoglobin currently 8.2 after 2 units PRBC.  -Iron saturation 16%  -Patient will be continued iron supplementation with dialysis- - borderline low heptoglobin, normal LDH, normal bilirubin--nonspecific in the face of chronic disease and comorbidities and ESRD--this was discussed Dr. Truett Perna -The patient has seen hematology, Dr. Truett Perna in past-->case was discussed with Dr. Truett Perna -He did not feel that the patient had any urgent need for bone marrow biopsy at this time. He felt that the patient's anemia was likely multifactorial due to the reasons noted above -He recommended outpatient followup. His office will contact the patient.  Dr. Truett Perna also recommended routing H/H monitoring every one to 2 weeks. -Follow  H&H. Placed on a PPI.  -Patient Currently does not want bone marrow biopsy to be done -appreciate GI eval--stated that the only diagnostic testing has not done is capsule endoscopy which was unlikely to change management. They recommended continued  Aranesp and iron supplementation.  They felt +FOBT was nonspecific #2 end-stage renal disease on hemodialysis Tuesday Thursday Saturday  -appreciate nephrology  -On the day of discharge, the patient had infiltration of his fistula access--he did not want any further dialysis until his next dialysis session on Saturday 7/26 -Case discussed with Dr. Arlean Hopping whom stated pt could be d/ced #3 hypertension  Continue home regimen of hydralazine.  -May need to add additional agent if does not improve with hemodialysis  #4 history of seizure  Patient denies any history of seizures however is on Keppra. Resume home regimen of Keppra.  #5 hypokalemia  Repleted  #6 tobacco abuse  Tobacco cessation. Place on a nicotine patch.  #7 well-controlled diabetes mellitus  Check a hemoglobin A1c--5.0  -Discontinue Accu-Cheks  #8 prophylaxis  PPI for GI prophylaxis. SCDs for DVT prophylaxis. #9 Cocaine use -+UDS -cessation discussed  Discharge Condition: stable  Disposition:  Follow-up Information   Follow up with Thornton Papas, MD In 1 month.   Contact information:   [address] [place] [address] [phone]       Follow up with Dyke Maes, MD.   Contact information:   [address] [place] [address] [phone]     home  Diet:renal Wt Readings from Last 3 Encounters:  11/08/12 62.415 kg (137 lb 9.6 oz)  11/08/12 62.415 kg (137 lb 9.6 oz)  06/01/12 67.2 kg (148  lb 2.4 oz)    History of present illness:  57 y.o. male with history of hypertension, diabetes, end-stage renal disease on hemodialysis, questionable seizure disorder, history of anemia, history of hepatitis B for antibody positive, history of hepatitis C  antibody positive, prior history of cocaine abuse, history of rectal bleed in January of 2014 seen by GI and felt to be multifactorial which resolved on its own without any further GI workup, history of colonic polyp in May of 2012 per Dr. Allena Katz of [place]'s records who presents to the ED with a hemoglobin of 6.2 noted at hemodialysis. On 10/26/2012 patient's hemoglobin was 9.7.  Patient denies any overt gross GI bleed, no fever, no chills, no chest pain, no shortness of breath, no abdominal pain, no diarrhea, no constipation, no weakness, no fatigue. Patient does and does use of Goody's powder one time about a month ago.  Patient was seen in the ED FOBT was positive and hemoglobin noted at 7.2.     Consultants: [facility] Dr. Truett Perna via phone   Discharge Exam: Filed Vitals:   11/09/12 1420  BP: 187/85  Pulse: 83  Temp: 98.3 F (36.8 C)  Resp: 18   Filed Vitals:   11/09/12 0512 11/09/12 0720 11/09/12 0803 11/09/12 1420  BP: 168/68 186/74 172/76 187/85  Pulse: 80 81 80 83  Temp: 98.8 F (37.1 C) 98.7 F (37.1 C) 98.1 F (36.7 C) 98.3 F (36.8 C)  TempSrc: Oral Oral Oral Oral  Resp: 18 18 18 18   Height:      Weight:      SpO2: 96% 98% 97% 96%   General: A&O x 3, NAD, pleasant, cooperative Cardiovascular: RRR, no rub, no gallop, no S3 Respiratory: CTAB, no wheeze, no rhonchi Abdomen:soft, nontender, nondistended, positive bowel sounds Extremities: No edema, No lymphangitis, no petechiae  Discharge Instructions      Discharge Orders   Future Orders Complete By Expires     Diet - low sodium heart healthy  As directed     Discharge instructions  As directed     Comments:      Make sure you follow up with Dr. Mancel Bale, hematology.  They will contact you for an appointment.    Increase activity slowly  As directed         Medication List         calcium acetate 667 MG capsule  Commonly known as:  PHOSLO  Take 2,001 mg by mouth 3 (three)  times daily with meals.     ethyl chloride spray  Apply 1 application topically as needed.     folic acid 1 MG tablet  Commonly known as:  FOLVITE  Take 1 mg by mouth daily.     hydrALAZINE 100 MG tablet  Commonly known as:  APRESOLINE  Take 100 mg by mouth 2 (two) times daily.     levETIRAcetam 1000 MG tablet  Commonly known as:  KEPPRA  Take 1,000 mg by mouth 2 (two) times daily.     multivitamin with minerals tablet  Take 1 tablet by mouth daily.     pantoprazole 40 MG tablet  Commonly known as:  PROTONIX  Take 40 mg by mouth daily.         The results of significant diagnostics from this hospitalization (including imaging, microbiology, ancillary and laboratory) are listed below for reference.    Significant Diagnostic Studies: No results found.   Microbiology: Recent Results (from the past 240 hour(s))  URINE CULTURE     Status: None   Collection Time    11/07/12  7:03 PM      Result Value Range Status   Specimen Description URINE, RANDOM   Final   Special Requests NONE   Final   Culture  Setup Time 11/08/2012 01:45   Final   Colony Count NO GROWTH   Final   Culture NO GROWTH   Final   Report Status 11/08/2012 FINAL   Final     Labs: Basic Metabolic Panel:  Recent Labs Lab 11/07/12 1300 11/07/12 2011 11/07/12 2012 11/08/12 0558 11/09/12 0731  NA 140  --  139 139 137  K 3.3*  --  3.7 3.9 4.7  CL 98  --  100 100 99  CO2 37*  --  33* 31 28  GLUCOSE 141*  --  113* 113* 124*  BUN 17  --  24* 35* 57*  CREATININE 3.50*  --  4.47* 5.57* 7.81*  CALCIUM 8.9  --  8.8 8.8 9.0  MG  --  2.0  --   --   --   PHOS  --   --  2.5 2.9 3.9   Liver Function Tests:  Recent Labs Lab 11/07/12 1300 11/07/12 2012 11/08/12 0558 11/09/12 0731  AST 18  --  14  --   ALT 10  --  9  --   ALKPHOS 48  --  45  --   BILITOT 0.2*  --  0.3  --   PROT 7.3  --  6.6  --   ALBUMIN 3.2* 2.9* 2.8* 2.9*   No results found for this basename: LIPASE, AMYLASE,  in the last 168  hours No results found for this basename: AMMONIA,  in the last 168 hours CBC:  Recent Labs Lab 11/07/12 1207 11/08/12 0558 11/09/12 0825  WBC 5.0 5.6 6.3  NEUTROABS 3.1  --   --   HGB 7.2* 8.3* 8.2*  HCT 22.9* 25.6* 25.4*  MCV 89.5 87.7 88.5  PLT 236 219 223   Cardiac Enzymes: No results found for this basename: CKTOTAL, CKMB, CKMBINDEX, TROPONINI,  in the last 168 hours BNP: No components found with this basename: POCBNP,  CBG:  Recent Labs Lab 11/08/12 0808 11/08/12 1142 11/08/12 1622 11/08/12 2112 11/09/12 1047  GLUCAP 89 90 157* 118* 168*    Time coordinating discharge:  Greater than 30 minutes  Signed:  Kirbie Stodghill, DO [facility] Pager: [phone] 11/09/2012, 4:25 PM

## 2012-11-09 NOTE — Progress Notes (Addendum)
[facility] Progress Note  Subjective:   Venous infiltration this am in dialysis. Angry, refusing to return later today. "I'll just wait until Saturday or Monday!" No other c/o's.  Objective Filed Vitals:   11/08/12 2108 11/09/12 0512 11/09/12 0720 11/09/12 0803  BP: 159/76 168/68 186/74 172/76  Pulse: 84 80 81 80  Temp: 97.9 F (36.6 C) 98.8 F (37.1 C) 98.7 F (37.1 C) 98.1 F (36.7 C)  TempSrc: Oral Oral Oral Oral  Resp: 18 18 18 18   Height: 5\' 9"  (1.753 m)     Weight: 62.415 kg (137 lb 9.6 oz)     SpO2: 96% 96% 98% 97%   Physical Exam General: Alert, cooperative, NAD Heart:RRR, no mrg Lungs: CTA bilaterally, no wheezes, rales or rhonchi Abdomen: Soft, NT non-distended Extremities: No LE edema Dialysis Access: LUA AVF +bruit  Outpatient Dialysis ([facility])  EDW 61 kg Bath 2K/2.25Ca 4hrs Heparin 1500 L BVT  Hectorol 6 mcg Epogen 11000 Units Venofer 0  Recent Labs: Hgb 7.0 < 7.8 < 9.7. Phos 5.1, Tsat 25%, PTH 246.1 in April. Binder is phoslo 3 ac  Assessment/Plan: 1. Anemia - Hgb 8.2 s/p 2 units PRBCs on 7/22. FOBT+ in ED. Tsat 16% with ferritin of 48. Received Epogen 11000 u at outpatient dialysis on 7/22. Aranesp 200 and  IV Fe load ordered.   Also ? of hemolysis. Has seen hematology( Dr. Truett Perna) in the past but failed to follow up. Primary to re-consult. GI also consulted, considering inpatient capsule endo. Follow CBC.  2. ESRD - TTS. Refusing HD today for fear of  infiltration. Encouraged to reconsider. K+4.7 after 20 mEq on 7/22. 3. Hypertension/volume - SBPs remain elevated 170s-180s on hydralazine 100 mg BID. Similar pattern seen in op dialysis center records. Op post weights variable and is under EDW at times. He signs off frequently secondary to cramping last 30 min of tx but body usually gives up more fluid than asked. EDW is ok.  4. Metabolic bone disease - Ca 9.8 corrected. Last PTH 246. Phos now 3.9. Phoslo held. Cont Vit D. 5. Nutrition  - Alb 2.9 Renal diet when, multivitamin  6. Seizure disorder - on Keppra  7. Hep C+ / hx cocaine abuse, tox screen +   Clydie Braun E. Thad Ranger [facility] Pager [phone] 11/09/2012,9:41 AM  LOS: 2 days   Patient seen and examined.  I agree with plan as above with additions as indicated. No further IP heme or GI workup per primary, OK for discharge from renal standoint. He refuses HD here today or in am, says he will return to OP center on Sat for next HD. Have d/w primary MD Vinson Moselle  MD Pager [phone]    Cell  [phone] 11/09/2012, 3:01 PM    Additional Objective Labs: Basic Metabolic Panel:  Recent Labs Lab 11/07/12 2012 11/08/12 0558 11/09/12 0731  NA 139 139 137  K 3.7 3.9 4.7  CL 100 100 99  CO2 33* 31 28  GLUCOSE 113* 113* 124*  BUN 24* 35* 57*  CREATININE 4.47* 5.57* 7.81*  CALCIUM 8.8 8.8 9.0  PHOS 2.5 2.9 3.9   Liver Function Tests:  Recent Labs Lab 11/07/12 1300 11/07/12 2012 11/08/12 0558 11/09/12 0731  AST 18  --  14  --   ALT 10  --  9  --   ALKPHOS 48  --  45  --   BILITOT 0.2*  --  0.3  --   PROT 7.3  --  6.6  --   ALBUMIN 3.2* 2.9* 2.8* 2.9*   No results found for this basename: LIPASE, AMYLASE,  in the last 168 hours CBC:  Recent Labs Lab 11/07/12 1207 11/08/12 0558 11/09/12 0825  WBC 5.0 5.6 6.3  NEUTROABS 3.1  --   --   HGB 7.2* 8.3* 8.2*  HCT 22.9* 25.6* 25.4*  MCV 89.5 87.7 88.5  PLT 236 219 223   Blood Culture    Component Value Date/Time   SDES URINE, RANDOM 11/07/2012 1903   SPECREQUEST NONE 11/07/2012 1903   CULT NO GROWTH 11/07/2012 1903   REPTSTATUS 11/08/2012 FINAL 11/07/2012 1903    Cardiac Enzymes: No results found for this basename: CKTOTAL, CKMB, CKMBINDEX, TROPONINI,  in the last 168 hours CBG:  Recent Labs Lab 11/07/12 2259 11/08/12 0808 11/08/12 1142 11/08/12 1622 11/08/12 2112  GLUCAP 119* 89 90 157* 118*   Iron Studies:  Recent Labs  11/07/12 2012  IRON 41*  TIBC 263   FERRITIN 48   @lablastinr3 @ Studies/Results: No results found. Medications:   . darbepoetin (ARANESP) injection - DIALYSIS  200 mcg Intravenous Q Thu-HD  . doxercalciferol  6 mcg Intravenous Q T,Th,Sa-HD  . ferric gluconate (FERRLECIT/NULECIT) IV  125 mg Intravenous Q T,Th,Sa-HD  . hydrALAZINE  100 mg Oral BID  . levETIRAcetam  1,000 mg Oral BID  . multivitamin  1 tablet Oral QHS  . pantoprazole  40 mg Oral Daily  . senna  1 tablet Oral BID  . sodium chloride  3 mL Intravenous Q12H  . sodium chloride  3 mL Intravenous Q12H

## 2012-11-10 LAB — FOLATE RBC: RBC Folate: 901 ng/mL — ABNORMAL HIGH (ref >=366)

## 2012-12-19 ENCOUNTER — Ambulatory Visit
Admission: RE | Admit: 2012-12-19 | Discharge: 2012-12-19 | Disposition: A | Discharge status: Home / Self Care | Payer: Medicare Other | Source: Ambulatory Visit | Attending: Nephrology | Admitting: Nephrology

## 2012-12-19 ENCOUNTER — Other Ambulatory Visit: Payer: Self-pay | Admitting: Nephrology

## 2012-12-19 DIAGNOSIS — A159 Respiratory tuberculosis unspecified: Secondary | ICD-10-CM

## 2013-01-02 ENCOUNTER — Emergency Department (HOSPITAL_COMMUNITY): Payer: Medicare Other

## 2013-01-02 ENCOUNTER — Inpatient Hospital Stay (HOSPITAL_COMMUNITY)
Admission: EM | Admit: 2013-01-02 | Discharge: 2013-01-09 | DRG: 811 | Disposition: A | Discharge status: Home / Self Care | Payer: Medicare Other | Attending: Internal Medicine | Admitting: Internal Medicine

## 2013-01-02 ENCOUNTER — Encounter (HOSPITAL_COMMUNITY): Payer: Self-pay | Admitting: *Deleted

## 2013-01-02 DIAGNOSIS — I120 Hypertensive chronic kidney disease with stage 5 chronic kidney disease or end stage renal disease: Secondary | ICD-10-CM | POA: Diagnosis present

## 2013-01-02 DIAGNOSIS — E440 Moderate protein-calorie malnutrition: Secondary | ICD-10-CM | POA: Insufficient documentation

## 2013-01-02 DIAGNOSIS — Z9119 Patient's noncompliance with other medical treatment and regimen: Secondary | ICD-10-CM

## 2013-01-02 DIAGNOSIS — E119 Type 2 diabetes mellitus without complications: Secondary | ICD-10-CM | POA: Diagnosis present

## 2013-01-02 DIAGNOSIS — K921 Melena: Secondary | ICD-10-CM | POA: Diagnosis present

## 2013-01-02 DIAGNOSIS — Z201 Contact with and (suspected) exposure to tuberculosis: Secondary | ICD-10-CM | POA: Diagnosis present

## 2013-01-02 DIAGNOSIS — D638 Anemia in other chronic diseases classified elsewhere: Secondary | ICD-10-CM | POA: Diagnosis present

## 2013-01-02 DIAGNOSIS — N186 End stage renal disease: Secondary | ICD-10-CM | POA: Diagnosis present

## 2013-01-02 DIAGNOSIS — R7612 Nonspecific reaction to cell mediated immunity measurement of gamma interferon antigen response without active tuberculosis: Secondary | ICD-10-CM | POA: Diagnosis present

## 2013-01-02 DIAGNOSIS — D509 Iron deficiency anemia, unspecified: Principal | ICD-10-CM | POA: Diagnosis present

## 2013-01-02 DIAGNOSIS — I10 Essential (primary) hypertension: Secondary | ICD-10-CM

## 2013-01-02 DIAGNOSIS — B1920 Unspecified viral hepatitis C without hepatic coma: Secondary | ICD-10-CM | POA: Diagnosis present

## 2013-01-02 DIAGNOSIS — Z91199 Patient's noncompliance with other medical treatment and regimen due to unspecified reason: Secondary | ICD-10-CM

## 2013-01-02 DIAGNOSIS — F1410 Cocaine abuse, uncomplicated: Secondary | ICD-10-CM | POA: Diagnosis present

## 2013-01-02 DIAGNOSIS — M899 Disorder of bone, unspecified: Secondary | ICD-10-CM | POA: Diagnosis present

## 2013-01-02 DIAGNOSIS — G40909 Epilepsy, unspecified, not intractable, without status epilepticus: Secondary | ICD-10-CM | POA: Diagnosis present

## 2013-01-02 DIAGNOSIS — R195 Other fecal abnormalities: Secondary | ICD-10-CM

## 2013-01-02 DIAGNOSIS — N2581 Secondary hyperparathyroidism of renal origin: Secondary | ICD-10-CM | POA: Diagnosis present

## 2013-01-02 DIAGNOSIS — D649 Anemia, unspecified: Secondary | ICD-10-CM

## 2013-01-02 LAB — CBC
HCT: 14.6 % — ABNORMAL LOW (ref 39.0–52.0)
MCH: 29.3 pg (ref 26.0–34.0)
MCV: 88.5 fL (ref 78.0–100.0)
MCV: 85.1 fL (ref 78.0–100.0)
Platelets: 212 10*3/uL (ref 150–400)
Platelets: 228 10*3/uL (ref 150–400)
RBC: 1.65 MIL/uL — ABNORMAL LOW (ref 4.22–5.81)
RBC: 2.42 MIL/uL — ABNORMAL LOW (ref 4.22–5.81)
RDW: 16.5 % — ABNORMAL HIGH (ref 11.5–15.5)
WBC: 5 10*3/uL (ref 4.0–10.5)

## 2013-01-02 LAB — CBC WITH DIFFERENTIAL/PLATELET
Basophils Relative: 1 % (ref 0–1)
Eosinophils Absolute: 0.1 10*3/uL (ref 0.0–0.7)
MCH: 29.7 pg (ref 26.0–34.0)
MCHC: 33.3 g/dL (ref 30.0–36.0)
Neutro Abs: 3.9 10*3/uL (ref 1.7–7.7)
Neutrophils Relative %: 71 % (ref 43–77)
Platelets: 215 10*3/uL (ref 150–400)
RBC: 1.28 MIL/uL — ABNORMAL LOW (ref 4.22–5.81)

## 2013-01-02 LAB — TROPONIN I
Troponin I: <0.3 ng/mL (ref <0.30)
Troponin I: <0.3 ng/mL (ref <0.30)

## 2013-01-02 LAB — COMPREHENSIVE METABOLIC PANEL
ALT: 12 U/L (ref 0–53)
AST: 19 U/L (ref 0–37)
Albumin: 2.8 g/dL — ABNORMAL LOW (ref 3.5–5.2)
Alkaline Phosphatase: 34 U/L — ABNORMAL LOW (ref 39–117)
Chloride: 98 mEq/L (ref 96–112)
Potassium: 5.1 mEq/L (ref 3.5–5.1)
Sodium: 138 mEq/L (ref 135–145)
Total Protein: 5.9 g/dL — ABNORMAL LOW (ref 6.0–8.3)

## 2013-01-02 LAB — RENAL FUNCTION PANEL
BUN: 114 mg/dL — ABNORMAL HIGH (ref 6–23)
CO2: 23 mEq/L (ref 19–32)
Chloride: 97 mEq/L (ref 96–112)
Creatinine, Ser: 11 mg/dL — ABNORMAL HIGH (ref 0.50–1.35)
GFR calc non Af Amer: 5 mL/min — ABNORMAL LOW (ref >90)
Potassium: 4.9 mEq/L (ref 3.5–5.1)

## 2013-01-02 LAB — PROTIME-INR: INR: 1 (ref 0.00–1.49)

## 2013-01-02 LAB — MRSA PCR SCREENING: MRSA by PCR: NEGATIVE

## 2013-01-02 MED ORDER — LEVETIRACETAM 500 MG PO TABS
1000.0000 mg | ORAL_TABLET | Freq: Two times a day (BID) | ORAL | Status: DC
  Administered 2013-01-03 – 2013-01-09 (×12): 1000 mg via ORAL
  Filled 2013-01-02 (×15): qty 2

## 2013-01-02 MED ORDER — ALTEPLASE 2 MG IJ SOLR: 2.0000 mg | Freq: Once | INTRAMUSCULAR | Status: DC | PRN

## 2013-01-02 MED ORDER — NEPRO/CARBSTEADY PO LIQD: 237.0000 mL | ORAL | Status: DC | PRN

## 2013-01-02 MED ORDER — CALCIUM ACETATE 667 MG PO CAPS
2001.0000 mg | ORAL_CAPSULE | Freq: Three times a day (TID) | ORAL | Status: DC
  Administered 2013-01-03 – 2013-01-08 (×14): 2001 mg via ORAL
  Filled 2013-01-02 (×20): qty 3

## 2013-01-02 MED ORDER — LIDOCAINE HCL (PF) 1 % IJ SOLN: 5.0000 mL | INTRAMUSCULAR | Status: DC | PRN

## 2013-01-02 MED ORDER — DARBEPOETIN ALFA-POLYSORBATE 200 MCG/0.4ML IJ SOLN
200.0000 ug | INTRAMUSCULAR | Status: DC
Start: 2013-01-02 — End: 2013-01-09
  Administered 2013-01-02 – 2013-01-09 (×2): 200 ug via INTRAVENOUS
  Filled 2013-01-02 (×2): qty 0.4

## 2013-01-02 MED ORDER — ADULT MULTIVITAMIN W/MINERALS CH
1.0000 | ORAL_TABLET | Freq: Every day | ORAL | Status: DC
  Administered 2013-01-03 – 2013-01-09 (×6): 1 via ORAL
  Filled 2013-01-02 (×8): qty 1

## 2013-01-02 MED ORDER — PANTOPRAZOLE SODIUM 40 MG PO TBEC
40.0000 mg | DELAYED_RELEASE_TABLET | Freq: Two times a day (BID) | ORAL | Status: DC
  Administered 2013-01-03 – 2013-01-09 (×12): 40 mg via ORAL
  Filled 2013-01-02 (×12): qty 1

## 2013-01-02 MED ORDER — HEPARIN SODIUM (PORCINE) 1000 UNIT/ML DIALYSIS: 1000.0000 [IU] | INTRAMUSCULAR | Status: DC | PRN

## 2013-01-02 MED ORDER — PENTAFLUOROPROP-TETRAFLUOROETH EX AERO: 1.0000 "application " | INHALATION_SPRAY | CUTANEOUS | Status: DC | PRN

## 2013-01-02 MED ORDER — DOXERCALCIFEROL 4 MCG/2ML IV SOLN
4.0000 ug | INTRAVENOUS | Status: DC
  Administered 2013-01-02 – 2013-01-09 (×4): 4 ug via INTRAVENOUS
  Filled 2013-01-02 (×4): qty 2

## 2013-01-02 MED ORDER — SODIUM CHLORIDE 0.9 % IJ SOLN
3.0000 mL | Freq: Two times a day (BID) | INTRAMUSCULAR | Status: DC
  Administered 2013-01-02 – 2013-01-09 (×13): 3 mL via INTRAVENOUS

## 2013-01-02 MED ORDER — SODIUM CHLORIDE 0.9 % IV SOLN: 100.0000 mL | INTRAVENOUS | Status: DC | PRN

## 2013-01-02 MED ORDER — LIDOCAINE-PRILOCAINE 2.5-2.5 % EX CREA: 1.0000 "application " | TOPICAL_CREAM | CUTANEOUS | Status: DC | PRN

## 2013-01-02 MED ORDER — FOLIC ACID 1 MG PO TABS
1.0000 mg | ORAL_TABLET | Freq: Every day | ORAL | Status: DC
  Administered 2013-01-03 – 2013-01-09 (×6): 1 mg via ORAL
  Filled 2013-01-02 (×8): qty 1

## 2013-01-02 NOTE — ED Notes (Signed)
MD at bedside. 

## 2013-01-02 NOTE — Progress Notes (Signed)
Patient received three units of blood... no sign symptoms of blood reaction noted. Denies pain, chills, dizziness.Marland KitchenMarland KitchenMarland Kitchen

## 2013-01-02 NOTE — H&P (Signed)
Date: 01/02/2013               Patient Name:  [name] MRN: [id number]  DOB: 25-Feb-1956 Age / Sex: 57 y.o., male   PCP: Dyke Maes, MD         Medical Service: Internal Medicine Teaching Service         Attending Physician: Dr. Inez Catalina, MD    First Contact: Dr. Vivi Barrack Pager: [phone]  Second Contact: Dr. Suszanne Conners Pager: [phone]       After Hours (After 5p/  First Contact Pager: [phone]  weekends / holidays): Second Contact Pager: [phone]   Chief Complaint: "Low blood counts"  History of Present Illness:  [name] is a 57 y.o. male with PMH ESRD on dialysis (TTS), HTN, anemia of chronic disease, cocaine abuse, tobacco and cocaine abuse who presents from dialysis after lab work showed a hemoglobin of 3.9.  He states that for the past few days he has had fatigue, lightheadedness (especially when standing from sitting), and dyspnea on exertion. He denies headache, visual changes, chest pain, nausea, vomiting, diarrhea, constipation, myalgias, arthralgias, leg swelling, changes in po intake. He denies any gross bleeding, including bloody stools, dark or tarry stools, hematemesis, hemoptysis. He says he feels like he is anemic. He admits to missing hemodialysis regularly. He has attended only 1 session in the past 2 weeks (on 12/28/12; his session today was aborted). [name] supposed to get Epogen 18,000 U with dialysis.  Of note he has had multiple similar episodes of anemia discovered at dialysis requiring hospitalization, but never this severe. Most recently he was admitted on November 07, 2012, with a Hgb 7.2. Extensive laboratory work up for anemia was performed including iron studies, which showed an anemia of chronic disease pattern, borderline iron deficiency (Fe 41), borderline low haptoglobin with normal LDH and bilirubin. At that time, he also had a positive FOBT. GI was consulted. Previous work up included: EGD 08/2010 showing small hiatal hernia,  colonoscopy 08/2010 showing adenomatous sigmoid polyp, EGD 2011 showing gastritis. The only diagnostic GI testing they recommended that had not been done recently was capsule endoscopy to rule out arteriovenous malformation, which the patient refused. (A prior note from 11/13 suggests he may have actually had a capsule endoscopy in 2012 in [place], [place], but we do not have the records.) His case was discussed with Dr. Truett Perna (hemetology) who suggested bone marrow biopsy as an outpatient, which the patient also refused. In the end, the team's conclusion was that his anemia was multifactorial from chronic disease, chronic renal insufficiency, iron deficiency, and occult gastrointestinal blood loss.  He was also apparently recently hospitalized at [facility] in late August for acute respiratory failure with concern for latent tuberculosis, with negative workup. Unfortunately their records do not appear to be in EPIC.  In the ED his hemoglobin was measured at 3.8. He received 2 U PRBCs with plans for an additional 2 U with dialysis this afternoon. Renal is on board.   Current Outpatient Prescriptions on File Prior to Encounter  Medication Sig Dispense Refill  . calcium acetate (PHOSLO) 667 MG capsule Take 2,001 mg by mouth 3 (three) times daily with meals.      . folic acid (FOLVITE) 1 MG tablet Take 1 mg by mouth daily.      . hydrALAZINE (APRESOLINE) 100 MG tablet Take 100 mg by mouth 2 (two) times daily.      Marland Kitchen levETIRAcetam (KEPPRA) 1000  MG tablet Take 1,000 mg by mouth 2 (two) times daily.      . Multiple Vitamins-Minerals (MULTIVITAMIN WITH MINERALS) tablet Take 1 tablet by mouth daily.      . pantoprazole (PROTONIX) 40 MG tablet Take 40 mg by mouth daily.      Marland Kitchen ethyl chloride spray Apply 1 application topically as needed.         Meds: Current Facility-Administered Medications  Medication Dose Route Frequency Provider Last Rate Last Dose  . 0.9 %  sodium chloride  infusion  100 mL Intravenous PRN Gerome Apley, PA-C      . 0.9 %  sodium chloride infusion  100 mL Intravenous PRN Gerome Apley, PA-C      . alteplase (CATHFLO ACTIVASE) injection 2 mg  2 mg Intracatheter Once PRN Gerome Apley, PA-C      . calcium acetate (PHOSLO) capsule 2,001 mg  2,001 mg Oral TID WC Judie Bonus, MD      . darbepoetin (ARANESP) injection 200 mcg  200 mcg Intravenous Q Tue-HD Gerome Apley, PA-C      . doxercalciferol (HECTOROL) injection 4 mcg  4 mcg Intravenous Q T,Th,Sa-HD Gerome Apley, PA-C      . feeding supplement (NEPRO CARB STEADY) liquid 237 mL  237 mL Oral PRN Gerome Apley, PA-C      . folic acid (FOLVITE) tablet 1 mg  1 mg Oral Daily Judie Bonus, MD      . heparin injection 1,000 Units  1,000 Units Dialysis PRN Gerome Apley, PA-C      . levETIRAcetam (KEPPRA) tablet 1,000 mg  1,000 mg Oral BID Judie Bonus, MD      . lidocaine (PF) (XYLOCAINE) 1 % injection 5 mL  5 mL Intradermal PRN Gerome Apley, PA-C      . lidocaine-prilocaine (EMLA) cream 1 application  1 application Topical PRN Gerome Apley, PA-C      . multivitamin with minerals tablet 1 tablet  1 tablet Oral Daily Judie Bonus, MD      . pantoprazole (PROTONIX) EC tablet 40 mg  40 mg Oral BID Judie Bonus, MD      . pentafluoroprop-tetrafluoroeth (GEBAUERS) aerosol 1 application  1 application Topical PRN Gerome Apley, PA-C      . sodium chloride 0.9 % injection 3 mL  3 mL Intravenous Q12H Judie Bonus, MD        Allergies: Allergies as of 01/02/2013 - Review Complete 01/02/2013  Allergen Reaction Noted  . Reglan [metoclopramide] Other (See Comments) 03/02/2012   Past Medical History  Diagnosis Date  . Hypertension   . Anemia, chronic disease   . CHF (congestive heart failure)     diastolic.  EF 60 - 65% per [facility] eco 11/2011  . Cocaine abuse     mentioned in notes from [place]  . Hepatitis C antibody test positive     was HIV negative, 02/28/12  .  Hepatitis B core antibody positive     03/01/10  . Positive QuantiFERON-TB Gold test     11/2011  . Helicobacter pylori gastritis     not defined if this was treated  . Polyp of colon, adenomatous     May 2012.  Dr [name] in [place]  . Shortness of breath   . Hematochezia   . Head injury, closed, with concussion   . History of blood transfusion     "last one was 2 days ago" (11/09/2012)  . Type II diabetes mellitus  on oral pills only  . Arthritis     "right shoulder" (11/09/2012)  . Seizure disorder     questionable history of - will need to clarify with PCP  . Headache(784.0)     "q other day" (11/09/2012)  . ESRD (end stage renal disease) on dialysis since 2012    "[place]; TTS" (11/09/2012)   Past Surgical History  Procedure Laterality Date  . Shoulder open rotator cuff repair Right   . Total knee arthroplasty Left   . Bascilic vein transposition  03/07/2012    Procedure: BASCILIC VEIN TRANSPOSITION;  Surgeon: Fransisco Hertz, MD;  Location: [facility] OR;  Service: Vascular;  Laterality: Left;  First Stage  . Insertion of dialysis catheter      right chest  . Bascilic vein transposition Left 05/31/2012    Procedure: BASCILIC VEIN TRANSPOSITION;  Surgeon: Fransisco Hertz, MD;  Location: [facility] OR;  Service: Vascular;  Laterality: Left;  Left 2nd Stage Basilic Vein Transposition with gortex graft revision using 59mmx10cm graft   Family History  Problem Relation Age of Onset  . Diabetes Mother   . Hypertension Mother   . Stroke Mother   . Kidney failure Mother    History   Social History  . Marital Status: Single    Spouse Name: N/A    Number of Children: N/A  . Years of Education: N/A   Occupational History  . Not on file.   Social History Main Topics  . Smoking status: Current Every Day Smoker -- 0.50 packs/day for 15 years    Types: Cigarettes  . Smokeless tobacco: Never Used  . Alcohol Use: Yes     Comment: 11/09/2012 "been stopped drinking 1-2 yr ago"  . Drug Use:  Yes    Special: "Crack" cocaine, Marijuana     Comment: 11/09/2012 "used weed > 20 yr ago; smokes crack-cocaine- last time ~ 5 months ago"  . Sexual Activity: Yes   Other Topics Concern  . Not on file   Social History Narrative  . No narrative on file    Review of Systems: Pertinent items are noted in HPI.  Physical Exam: Blood pressure 169/69, pulse 92, temperature 98.4 F (36.9 C), temperature source Oral, resp. rate 16, weight 136 lb 7.4 oz (61.9 kg), SpO2 100.00%. Physical Exam  Constitutional: [name] oriented to person, place, and time and well-developed, well-nourished, and in no distress.  HENT:  Head: Normocephalic and atraumatic.  Eyes: EOM are normal. Pupils are equal, round, and reactive to light.  Pale conjunctivae.  Neck: Normal range of motion. Neck supple.  Cardiovascular: Normal rate, regular rhythm, normal heart sounds and intact distal pulses.  Exam reveals no gallop and no friction rub.   No murmur heard. Pulmonary/Chest: Effort normal and breath sounds normal. No respiratory distress. He has no wheezes. He has no rales. He exhibits no tenderness.  Abdominal: Soft. Bowel sounds are normal. He exhibits no distension and no mass. There is no tenderness. There is no rebound and no guarding.  Musculoskeletal: Normal range of motion. He exhibits no edema and no tenderness.  Thin extremities.  Neurological: [name] alert and oriented to person, place, and time. No cranial nerve deficit. GCS score is 15.  Skin: Skin is warm and dry. No rash noted.  LUA AV fistula present with overlying bandage, palpable thrill and audible bruit.  Psychiatric: Mood and affect normal.     Lab results: Basic Metabolic Panel:  Recent Labs  11/91/47 0950 01/02/13 1430  NA 138 137  K 5.1 4.9  CL 98 97  CO2 24 23  GLUCOSE 72 150*  BUN 110* 114*  CREATININE 10.43* 11.00*  CALCIUM 7.5* 7.4*  PHOS  --  6.4*   Liver Function Tests:  Recent Labs  01/02/13 0950 01/02/13 1430    AST 19  --   ALT 12  --   ALKPHOS 34*  --   BILITOT 0.2*  --   PROT 5.9*  --   ALBUMIN 2.8* 2.6*   CBC:  Recent Labs  01/02/13 0950 01/02/13 1430  WBC 5.6 5.0  NEUTROABS 3.9  --   HGB 3.8* 4.8*  HCT 11.4* 14.6*  MCV 89.1 88.5  PLT 215 212   Cardiac Enzymes:  Recent Labs  01/02/13 1430  TROPONINI <0.30   Troponin i, poc  Date Value Range Status  01/02/2013 0.05  0.00 - 0.08 ng/mL Final    Coagulation:  Recent Labs  01/02/13 0950  LABPROT 13.0  INR 1.00    Imaging results:  Dg Chest 2 View  01/02/2013   *RADIOLOGY REPORT*  Clinical Data: Weakness  CHEST - 2 VIEW  Comparison: 12/19/2012  Findings: The cardiac shadow is stable.  Some left basilar atelectasis is noted new from prior study.  The lungs are otherwise clear.  Mild hyperinflation is again noted.  No acute bony abnormality is seen.  IMPRESSION: New mild atelectasis in the left base.  No focal infiltrate is seen.   Original Report Authenticated By: Alcide Clever, M.D.    Other results: EKG: unchanged from previous tracings, LVH.  Assessment & Plan by Problem: [name] is a 57 y.o. male with PMH ESRD on dialysis (TTS), HTN, anemia of chronic disease, cocaine abuse, tobacco and cocaine abuse who presents from dialysis after lab work showed a hemoglobin of 3.9.  #Acute on chronic normocytic anemia - Hgb 3.8, HCT 11.4, MCV 89.1, RDW 17.7. Not pancytopenic as WBC 5.6, plt 215. During prior admissions his chronic anemia was thought to be multifactorial from chronic disease, chronic renal insufficiency, iron deficiency, and occult gastrointestinal blood loss. However, his Hgb has never been this low. Per chart review the lowest it has ever been prior to today was 5.3 in 11/13. During this admission, Dr. Truett Perna with hematology suspected a bone marrow process in light of low retic count and no evidence of active hemolysis, but the patient refused biopsy at that time and has continued to refuse it on subsequent  hospitalizations. Hgb response after 1 U PRBC was appropriate at 4.8. [name] ordered to receive 2 additional U this evening in dialysis (for a total of 3).  - Admit to IMTS, step down - Consult GI in am for possible capsule endoscopy - Will discuss bone marrow biopsy with the patient in am again, to see if he has changed his mind - Cardiac monitoring - Protonix 40mg  BID po - Folate 1mg  and MV daily - Follow up evening CBC s/p 3U PRBC, UDS, FOBT - Trending troponins x3 - Follow up am CBC  #ESRD (end stage renal disease) on dialysis - BUN/Cr 114/11. Patient still makes some urine. He has been noncompliant with his dialysis, only attending 1 session in the past 2 weeks. K is 4.9. CXR negative for pulmonary edema. He will recieve dialysis this evening. - Appreciate renal recs - Aranesp (darbepoetin alfa) with dialysis tonight - Hectorol 4 mcg with dialysis, Phoslo TID with meals - Follow up am renal function panel - Renal diet  #Diabetes  mellitus  - Last A1C 5.0 on 11/07/12. Not on medications or insulin. Per BMPs here his glucose has been 72-150. - Will follow BMPs and add CBG monitoring if needed  #Hypertension - BPs 165/69. He takes hydralazine 100mg  po BID at home. - Holding hydralazine until dialysis complete, will re-assess and start if needed afterwards  #Seizure disorder - Stable. No seizure activity. - Continue home keppra  #Cocaine abuse - Patient reports he last abused crack cocaine 3 weeks ago. - Follow up UDS  #DVT PPX  - SCDs   Dispo: Disposition is deferred at this time, awaiting improvement of current medical problems. Anticipated discharge in approximately 1-3 day(s).   The patient does have a current PCP Dyke Maes, MD) and does need an [facility] hospital follow-up appointment after discharge.  The patient does not have transportation limitations that hinder transportation to clinic appointments.  Signed: Vivi Barrack, MD 01/02/2013, 6:32 PM

## 2013-01-02 NOTE — ED Notes (Signed)
Pt reports cocaine use x2 weeks ago

## 2013-01-02 NOTE — Procedures (Signed)
I have personally attended this patient's dialysis session.   [name] to receive a total of 3 units of PRBC's (1st infusing now) for Hb of 4.8 No heparin with HD 2K bath (K 5.1) Left AVF BFR 400   [name] B

## 2013-01-02 NOTE — Progress Notes (Signed)
Left upper HD graft 2 needle pulled ou w/ small amount of bleeding noted, pressre dreesing applied for 15 min. And ER RN will hold pressure for 20 min. More.

## 2013-01-02 NOTE — ED Notes (Signed)
Pt went to dialysis this morning, received one hour of treatment and they stopped due to hgb of 3.9. Pt reports sob, tired, lightheaded x 2 days. Hx of this happening. No acute distress noted. Still has dialysis graft accessed left arm. Last dialysis treatment was Saturday.

## 2013-01-02 NOTE — Consult Note (Signed)
[facility] Renal Consultation Note  Indication for Consultation:  Management of ESRD/hemodialysis; anemia, hypertension/volume and secondary hyperparathyroidism  HPI: [name] is a 57 y.o. male with ESRD on dialysis on TTS at the [facility] who presented to dialysis this morning after two days of worsening fatigue, dyspnea on exertion, dizziness, and lightheadedness and was sent to the ED when his in-center hemoglobin was found to be 3.9.  He denies any evidence of blood in his stool.  He has had previous episodes of anemia requiring hospitalization, most recently 7/22-24, when he had heme-positive stool, but had complete workup in 2012, at which time endoscopy showed a small hiatal hernia and colonoscopy showed an adenomatous sigmoid polyp. In July no further recommendations were made after the patient refused capsule endoscopy to evaluate for AVMs.  He has also been seen by Dr. Truett Perna of Hematology for possible bone marrow biopsy, but did not follow-up as recommended.  He receives Epogen 18,000 U with dialysis, but has attended only on treatment--on 9/11--in the last two weeks.  PRBC's were started in the ED and the first unit is presently infusing in the dialysis unit. He will get 2 more on dialysis.    He was also hospitalized at the [facility] in late August for acute respiratory failure with concern for latent tuberculosis, but had negative workup, including bronchoscopy (negative AFB smears).  Dialysis Orders:  TTS @ AF 4 hrs   60.5 kg   2K/2.25Ca   400/A1.5   Heparin 1000 U   AVF @ LUA  Hectorol 4 mcg    Epogen 18,000 U     Venofer 0  Past Medical History  Diagnosis Date  . Hypertension   . Anemia, chronic disease   . CHF (congestive heart failure)     diastolic.  EF 60 - 65% per [facility] eco 11/2011  . Cocaine abuse     mentioned in notes from [place]  . Hepatitis C antibody test positive     was HIV negative, 02/28/12  .  Hepatitis B core antibody positive     03/01/10  . Positive QuantiFERON-TB Gold test     11/2011  . Helicobacter pylori gastritis     not defined if this was treated  . Polyp of colon, adenomatous     May 2012.  Dr [name] in [place]  . Shortness of breath   . Hematochezia   . Head injury, closed, with concussion   . History of blood transfusion     "last one was 2 days ago" (11/09/2012)  . Type II diabetes mellitus     on oral pills only  . Arthritis     "right shoulder" (11/09/2012)  . Seizure disorder     questionable history of - will need to clarify with PCP  . Headache(784.0)     "q other day" (11/09/2012)  . ESRD (end stage renal disease) on dialysis since 2012    "[place]; TTS" (11/09/2012)   Past Surgical History  Procedure Laterality Date  . Shoulder open rotator cuff repair Right   . Total knee arthroplasty Left   . Bascilic vein transposition  03/07/2012    Procedure: BASCILIC VEIN TRANSPOSITION;  Surgeon: Fransisco Hertz, MD;  Location: [facility] OR;  Service: Vascular;  Laterality: Left;  First Stage  . Insertion of dialysis catheter      right chest  . Bascilic vein transposition Left 05/31/2012    Procedure: BASCILIC VEIN TRANSPOSITION;  Surgeon:  Fransisco Hertz, MD;  Location: [facility] OR;  Service: Vascular;  Laterality: Left;  Left 2nd Stage Basilic Vein Transposition with gortex graft revision using 57mmx10cm graft   Family History  Problem Relation Age of Onset  . Diabetes Mother   . Hypertension Mother   . Stroke Mother   . Kidney failure Mother    Social History He smokes about 1/2 pack of cigarettes a day, but currently denies any alcohol use.  He continues to use crack cocaine, most recently three weeks ago, but denies other illicit drug use.  Allergies  Allergen Reactions  . Reglan [Metoclopramide] Other (See Comments)    Tardive dyskinesia in 11/2011 in [place]   Prior to Admission medications   Medication Sig Start Date End Date Taking? Authorizing  Provider  calcium acetate (PHOSLO) 667 MG capsule Take 2,001 mg by mouth 3 (three) times daily with meals.   Yes Historical Provider, MD  folic acid (FOLVITE) 1 MG tablet Take 1 mg by mouth daily.   Yes Historical Provider, MD  hydrALAZINE (APRESOLINE) 100 MG tablet Take 100 mg by mouth 2 (two) times daily.   Yes Historical Provider, MD  levETIRAcetam (KEPPRA) 1000 MG tablet Take 1,000 mg by mouth 2 (two) times daily.   Yes Historical Provider, MD  Multiple Vitamins-Minerals (MULTIVITAMIN WITH MINERALS) tablet Take 1 tablet by mouth daily.   Yes Historical Provider, MD  pantoprazole (PROTONIX) 40 MG tablet Take 40 mg by mouth daily.   Yes Historical Provider, MD  ethyl chloride spray Apply 1 application topically as needed.    Historical Provider, MD   Labs:  Results for orders placed during the hospital encounter of 01/02/13 (from the past 48 hour(s))  TYPE AND SCREEN     Status: None   Collection Time    01/02/13  9:10 AM      Result Value Range   ABO/RH(D) A POS     Antibody Screen NEG     Sample Expiration 01/05/2013     Unit Number [id number]     Blood Component Type RED CELLS,LR     Unit division 00     Status of Unit ISSUED     Transfusion Status OK TO TRANSFUSE     Crossmatch Result Compatible     Unit Number [id number]     Blood Component Type RED CELLS,LR     Unit division 00     Status of Unit ALLOCATED     Transfusion Status OK TO TRANSFUSE     Crossmatch Result Compatible    CBC WITH DIFFERENTIAL     Status: Abnormal   Collection Time    01/02/13  9:50 AM      Result Value Range   WBC 5.6  4.0 - 10.5 K/uL   RBC 1.28 (*) 4.22 - 5.81 MIL/uL   Hemoglobin 3.8 (*) 13.0 - 17.0 g/dL   Comment: REPEATED TO VERIFY     CRITICAL RESULT CALLED TO, READ BACK BY AND VERIFIED WITH:     K.COLLINS,RN 1042 01/02/13 CLARK,S   HCT 11.4 (*) 39.0 - 52.0 %   MCV 89.1  78.0 - 100.0 fL   MCH 29.7  26.0 - 34.0 pg   MCHC 33.3  30.0 - 36.0 g/dL   RDW 96.2 (*) 95.2 - 84.1 %    Platelets 215  150 - 400 K/uL   Neutrophils Relative % 71  43 - 77 %   Neutro Abs 3.9  1.7 - 7.7 K/uL   Lymphocytes Relative  19  12 - 46 %   Lymphs Abs 1.0  0.7 - 4.0 K/uL   Monocytes Relative 7  3 - 12 %   Monocytes Absolute 0.4  0.1 - 1.0 K/uL   Eosinophils Relative 2  0 - 5 %   Eosinophils Absolute 0.1  0.0 - 0.7 K/uL   Basophils Relative 1  0 - 1 %   Basophils Absolute 0.0  0.0 - 0.1 K/uL  COMPREHENSIVE METABOLIC PANEL     Status: Abnormal   Collection Time    01/02/13  9:50 AM      Result Value Range   Sodium 138  135 - 145 mEq/L   Potassium 5.1  3.5 - 5.1 mEq/L   Chloride 98  96 - 112 mEq/L   CO2 24  19 - 32 mEq/L   Glucose, Bld 72  70 - 99 mg/dL   BUN 161 (*) 6 - 23 mg/dL   Creatinine, Ser 09.60 (*) 0.50 - 1.35 mg/dL   Calcium 7.5 (*) 8.4 - 10.5 mg/dL   Total Protein 5.9 (*) 6.0 - 8.3 g/dL   Albumin 2.8 (*) 3.5 - 5.2 g/dL   AST 19  0 - 37 U/L   ALT 12  0 - 53 U/L   Alkaline Phosphatase 34 (*) 39 - 117 U/L   Total Bilirubin 0.2 (*) 0.3 - 1.2 mg/dL   GFR calc non Af Amer 5 (*) >90 mL/min   GFR calc Af Amer 6 (*) >90 mL/min   Comment: (NOTE)     The eGFR has been calculated using the CKD EPI equation.     This calculation has not been validated in all clinical situations.     eGFR's persistently <90 mL/min signify possible Chronic Kidney     Disease.  PROTIME-INR     Status: None   Collection Time    01/02/13  9:50 AM      Result Value Range   Prothrombin Time 13.0  11.6 - 15.2 seconds   INR 1.00  0.00 - 1.49  POCT I-STAT TROPONIN I     Status: None   Collection Time    01/02/13 10:21 AM      Result Value Range   Troponin i, poc 0.05  0.00 - 0.08 ng/mL   Comment 3            Comment: Due to the release kinetics of cTnI,     a negative result within the first hours     of the onset of symptoms does not rule out     myocardial infarction with certainty.     If myocardial infarction is still suspected,     repeat the test at appropriate intervals.  PREPARE RBC  (CROSSMATCH)     Status: None   Collection Time    01/02/13 10:49 AM      Result Value Range   Order Confirmation ORDER PROCESSED BY BLOOD BANK     Constitutional: positive for fatigue, negative for chills, fevers and sweats Ears, nose, mouth, throat, and face: negative for earaches, hoarseness, nasal congestion and sore throat Respiratory: positive for dyspnea on exertion, negative for cough, hemoptysis and sputum Cardiovascular: negative for chest pain, chest pressure/discomfort, orthopnea and palpitations Gastrointestinal: negative for abdominal pain, change in bowel habits, nausea and vomiting Genitourinary:negative, oliguric Musculoskeletal:negative for arthralgias, back pain, myalgias and neck pain Neurological: positive for dizziness and weakness, negative for gait problems and headaches  Physical Exam: Filed Vitals:   01/02/13 1201  BP: 165/69  Pulse: 85  Temp: 98.3 F (36.8 C)  Resp: 27     General appearance: alert, cooperative and no distress Head: Normocephalic, without obvious abnormality, atraumatic Neck: no adenopathy, no carotid bruit, no JVD and supple, symmetrical, trachea midline Resp: clear to auscultation bilaterally Cardio: RRR with Gr I/VI systolic murmur, no rub GI: soft, non-tender; bowel sounds normal; no masses,  no organomegaly Extremities: extremities normal, atraumatic, no cyanosis or edema Neurologic: Grossly normal Dialysis Access: AVF @ LUA with + bruit   Assessment/Plan: 1. Anemia - Hgb 3.8 in ED, receiving 2 U PRBCs in ED; previous extensive GI workup with small hiatal hernia per EGD & adenomatous sigmoid polyp per colonoscopy in 08/2010, gastritis per EGD 2011; capsule endoscopy declined in 10/2012; likely multifactorial; on outpatient Epogen 18,000 U, but misses HD; last T-sat 23% (8/20), but ferritin 1410.  Transfuse additional units with HD for a total of 3. 2. ESRD - HD on TTS @ AF; K 5.1 today; last HD on 9/11, previously 9/4. On HD  now 3. Hypertension/volume - BP 170/75 on outpatient Hydralazine 100 mg bid; CXR negative for edema. 4. Metabolic bone disease - Last Ca 8.7, P 4.6, iPTH 91; Hectorol 4 mcg, Phoslo 3 with meals. 5. Nutrition - Last Alb 3.5, renal diet, vitamin. 6. Hx + TB Gold test - 11/2011, evaluated in [place] in Aug with negative workup. 7. Hepatitis C 8. DM Type 2 9. Hx seizure disorder 10. Hx cocaine abuse  [name],[place] 01/02/2013, 12:31 PM   Attending Nephrologist: Camille Bal, MD I have seen and examined this patient and agree with plan as outlined above. Regular HD today, Aranesp with HD (for EPO - his usual dose when he actually shows up for HD is 18,000 units), and a total of 3 units of PRBC's on HD. Workup per primary service. Janayla Marik B,MD 01/02/2013 3:11 PM

## 2013-01-02 NOTE — ED Notes (Signed)
Report given to floor nurse on 2C18 Lasandra Beech.

## 2013-01-02 NOTE — ED Notes (Signed)
Informed Consent received for blood transfusion.  Per MD pt ok to receive snack before transfusion.

## 2013-01-02 NOTE — ED Notes (Signed)
Spoke to IV team to D/C dialysis IV catheter

## 2013-01-02 NOTE — ED Provider Notes (Signed)
CSN: [id number]     Arrival date & time 01/02/13  9562 History   First MD Initiated Contact with Patient 01/02/13 [phone]     Chief Complaint  Patient presents with  . Anemia    HPI Comments: Pt is a 57 y/o M with significant PMHx of HTN, Anemia of Chronic disease, Cocaine abuse, Hep C/Hep B positive antibodies, ESRD M/T/Sat on dialysis who presents from dialysis due to anemia.  Pt states he has had decreased energy, dyspnea on exertion, lightheadedness or dizziness with standing over the past two days.  As well he is c/o productive cough, greenish sputum that began at that time as well.  Denies any nausea or vomiting, hematochezia/melena, changes in stool patterns, abdominal pain, HA, blurred vision, CP, Chest tightness/pressure, lower extremity edema.  Pt has similar episodes of anemia requiring hospitalization, with his most recent in November 07, 2012.  At that time, he had multiple w/u including iron studies, discussion with hematology, GI evaluation.  Previous Gi w/u includes EGD May of 2012 showing small hiatal hernia, EGD 2011 showing gastritis, Colonoscopy of May 2012 showing adenomatous sigmoid polyp and GI during last hospital visit had no further recommendations other than possible capsule endoscopy to evaluate for AVM.  Per hematology, Dr. Truett Perna recommended f/u in outpatient setting with possible Bone Marrow Bx.    Patient is a 57 y.o. male presenting with anemia. The history is provided by the patient.  Anemia Associated symptoms include fatigue and weakness. Pertinent negatives include no abdominal pain, chest pain, coughing, diaphoresis, fever, headaches, nausea, neck pain, numbness, sore throat or vomiting.    Past Medical History  Diagnosis Date  . Hypertension   . Anemia, chronic disease   . CHF (congestive heart failure)     diastolic.  EF 60 - 65% per [facility] eco 11/2011  . Cocaine abuse     mentioned in notes from [place]  . Hepatitis C antibody test positive     was HIV  negative, 02/28/12  . Hepatitis B core antibody positive     03/01/10  . Positive QuantiFERON-TB Gold test     11/2011  . Helicobacter pylori gastritis     not defined if this was treated  . Polyp of colon, adenomatous     May 2012.  Dr [name] in [place]  . Shortness of breath   . Hematochezia   . Head injury, closed, with concussion   . History of blood transfusion     "last one was 2 days ago" (11/09/2012)  . Type II diabetes mellitus     on oral pills only  . Arthritis     "right shoulder" (11/09/2012)  . Seizure disorder     questionable history of - will need to clarify with PCP  . Headache(784.0)     "q other day" (11/09/2012)  . ESRD (end stage renal disease) on dialysis since 2012    "[place]; TTS" (11/09/2012)   Past Surgical History  Procedure Laterality Date  . Shoulder open rotator cuff repair Right   . Total knee arthroplasty Left   . Bascilic vein transposition  03/07/2012    Procedure: BASCILIC VEIN TRANSPOSITION;  Surgeon: Fransisco Hertz, MD;  Location: [facility] OR;  Service: Vascular;  Laterality: Left;  First Stage  . Insertion of dialysis catheter      right chest  . Bascilic vein transposition Left 05/31/2012    Procedure: BASCILIC VEIN TRANSPOSITION;  Surgeon: Fransisco Hertz, MD;  Location: [facility] OR;  Service: Vascular;  Laterality: Left;  Left 2nd Stage Basilic Vein Transposition with gortex graft revision using 23mmx10cm graft   Family History  Problem Relation Age of Onset  . Diabetes Mother   . Hypertension Mother   . Stroke Mother   . Kidney failure Mother    History  Substance Use Topics  . Smoking status: Current Every Day Smoker -- 0.50 packs/day for 15 years    Types: Cigarettes  . Smokeless tobacco: Never Used  . Alcohol Use: Yes     Comment: 11/09/2012 "been stopped drinking 1-2 yr ago"    Review of Systems  Constitutional: Positive for activity change and fatigue. Negative for fever, diaphoresis and unexpected weight change.  HENT: Negative  for sore throat and neck pain.   Eyes: Negative for photophobia.  Respiratory: Positive for shortness of breath. Negative for cough, choking and chest tightness.   Cardiovascular: Negative for chest pain, palpitations and leg swelling.  Gastrointestinal: Negative for nausea, vomiting, abdominal pain, diarrhea, constipation, blood in stool and anal bleeding.  Musculoskeletal: Negative for gait problem.  Neurological: Positive for dizziness, weakness and light-headedness. Negative for syncope, speech difficulty, numbness and headaches.  All other systems reviewed and are negative.    Allergies  Reglan  Home Medications   Current Outpatient Rx  Name  Route  Sig  Dispense  Refill  . calcium acetate (PHOSLO) 667 MG capsule   Oral   Take 2,001 mg by mouth 3 (three) times daily with meals.         . folic acid (FOLVITE) 1 MG tablet   Oral   Take 1 mg by mouth daily.         . hydrALAZINE (APRESOLINE) 100 MG tablet   Oral   Take 100 mg by mouth 2 (two) times daily.         Marland Kitchen levETIRAcetam (KEPPRA) 1000 MG tablet   Oral   Take 1,000 mg by mouth 2 (two) times daily.         . Multiple Vitamins-Minerals (MULTIVITAMIN WITH MINERALS) tablet   Oral   Take 1 tablet by mouth daily.         . pantoprazole (PROTONIX) 40 MG tablet   Oral   Take 40 mg by mouth daily.         Marland Kitchen ethyl chloride spray   Topical   Apply 1 application topically as needed.          BP 173/64  Pulse 80  Temp(Src) 97.7 F (36.5 C) (Oral)  Resp 22  SpO2 97% Physical Exam  Constitutional: [name] oriented to person, place, and time. Vital signs are normal. He appears well-developed and well-nourished.  Non-toxic appearance. He does not have a sickly appearance. He appears ill. No distress.  HENT:  Head: Normocephalic and atraumatic.  Mouth/Throat: Oropharynx is clear and moist. Mucous membranes are dry.  + glossitis   Eyes: EOM are normal. Pupils are equal, round, and reactive to light. No  scleral icterus.  B/L Conjunctival Pallor  Neck: Normal range of motion. No JVD present.  Cardiovascular: Normal rate, regular rhythm, S1 normal, S2 normal, intact distal pulses and normal pulses.   Murmur heard.  Systolic murmur is present with a grade of 1/6  Pulmonary/Chest: Effort normal and breath sounds normal. No respiratory distress.  Abdominal: Normal appearance and bowel sounds are normal. There is no hepatosplenomegaly. There is no tenderness. There is no rebound and no CVA tenderness.  Neurological: [name] alert and oriented  to person, place, and time. He has normal strength. No cranial nerve deficit.  Skin: Skin is warm and dry.     AV Fistula in place, bruit palpated and ausciltated without abnormality noted.  No erythema, edema, ecchymosis present around graft    ED Course  Procedures (including critical care time)  Date: 01/02/2013  Rate: 84  Rhythm: normal sinus rhythm  QRS Axis: normal  Intervals: normal  ST/T Wave abnormalities: normal  Conduction Disutrbances: none  Narrative Interpretation: LVH, unchanged from previous   Old EKG Reviewed: No significant changes noted  Labs Review Labs Reviewed  CBC WITH DIFFERENTIAL - Abnormal; Notable for the following:    RBC 1.28 (*)    Hemoglobin 3.8 (*)    HCT 11.4 (*)    RDW 17.7 (*)    All other components within normal limits  COMPREHENSIVE METABOLIC PANEL - Abnormal; Notable for the following:    BUN 110 (*)    Creatinine, Ser 10.43 (*)    Calcium 7.5 (*)    Total Protein 5.9 (*)    Albumin 2.8 (*)    Alkaline Phosphatase 34 (*)    Total Bilirubin 0.2 (*)    GFR calc non Af Amer 5 (*)    GFR calc Af Amer 6 (*)    All other components within normal limits  PROTIME-INR  URINALYSIS, ROUTINE W REFLEX MICROSCOPIC  URINE RAPID DRUG SCREEN (HOSP PERFORMED)  OCCULT BLOOD X 1 CARD TO LAB, STOOL  POCT I-STAT TROPONIN I  TYPE AND SCREEN  PREPARE RBC (CROSSMATCH)   Imaging Review Dg Chest 2 View  01/02/2013    *RADIOLOGY REPORT*  Clinical Data: Weakness  CHEST - 2 VIEW  Comparison: 12/19/2012  Findings: The cardiac shadow is stable.  Some left basilar atelectasis is noted new from prior study.  The lungs are otherwise clear.  Mild hyperinflation is again noted.  No acute bony abnormality is seen.  IMPRESSION: New mild atelectasis in the left base.  No focal infiltrate is seen.   Original Report Authenticated By: Alcide Clever, M.D.    MDM   1. Anemia   2. End stage renal disease   3. Hypertension   4. Cocaine abuse   Pt w/ multiple hospitalization within the past year for similar episodes.  Will evaluate with multiple labs including CBC, CMET, FOBT, POC trop, EKG, and will type and screen pt.    10:32 AM - CBC showing Hgb of 3.8, vitals stable. Will transfuse 2 U and call for admission.    11:21 AM - Spoke with Dr. Bosie Clos, IM resident, who will evaluate and admit the patient.  Will discuss with renal physician on call as well, Dr. Eliott Nine about need for dialysis today.     11:24 AM - Spoke with Dr. Eliott Nine, plan on dialysis today.  Will wait admission.    Twana First Paulina Fusi, DO of [facility] 01/02/2013, 11:30 AM     Briscoe Deutscher, DO 01/02/13 1130  Briscoe Deutscher, DO 01/03/13 1129

## 2013-01-02 NOTE — ED Notes (Signed)
Pt returned from CT °

## 2013-01-02 NOTE — ED Notes (Signed)
Patient transported to CT 

## 2013-01-02 NOTE — ED Provider Notes (Signed)
Patient seen/examined in the Emergency Department in conjunction with Resident Physician Provider Hess Patient reports weakness and anemia Exam : awake/alert,but appears fatigued Plan: admit for dialysis and blood transfusion  Pt very ill with severe anemia (HGB <4) and also worsening renal failure Will need admission and close monitoring Pt gave verbal/written consent for blood transfusion Stabilized in the ER   Joya Gaskins, MD 01/02/13 1247

## 2013-01-02 NOTE — ED Notes (Signed)
IV Team at bedside.  Dialysis IV removed.

## 2013-01-02 NOTE — ED Notes (Signed)
MD at bedside.  Signed consent.

## 2013-01-03 DIAGNOSIS — E440 Moderate protein-calorie malnutrition: Secondary | ICD-10-CM | POA: Insufficient documentation

## 2013-01-03 DIAGNOSIS — D649 Anemia, unspecified: Secondary | ICD-10-CM

## 2013-01-03 DIAGNOSIS — R195 Other fecal abnormalities: Secondary | ICD-10-CM

## 2013-01-03 LAB — PREPARE RBC (CROSSMATCH)

## 2013-01-03 LAB — CBC
HCT: 19.2 % — ABNORMAL LOW (ref 39.0–52.0)
MCHC: 33.9 g/dL (ref 30.0–36.0)
Platelets: 215 10*3/uL (ref 150–400)
Platelets: 194 10*3/uL (ref 150–400)
RBC: 2.52 MIL/uL — ABNORMAL LOW (ref 4.22–5.81)
RDW: 16.9 % — ABNORMAL HIGH (ref 11.5–15.5)
WBC: 6.4 10*3/uL (ref 4.0–10.5)
WBC: 6.1 10*3/uL (ref 4.0–10.5)

## 2013-01-03 LAB — RENAL FUNCTION PANEL
Albumin: 2.4 g/dL — ABNORMAL LOW (ref 3.5–5.2)
Chloride: 99 mEq/L (ref 96–112)
GFR calc Af Amer: 13 mL/min — ABNORMAL LOW (ref >90)
GFR calc non Af Amer: 12 mL/min — ABNORMAL LOW (ref >90)
Phosphorus: 4.5 mg/dL (ref 2.3–4.6)
Potassium: 4.2 mEq/L (ref 3.5–5.1)
Sodium: 136 mEq/L (ref 135–145)

## 2013-01-03 LAB — TROPONIN I: Troponin I: <0.3 ng/mL (ref <0.30)

## 2013-01-03 MED ORDER — HYDRALAZINE HCL 50 MG PO TABS
100.0000 mg | ORAL_TABLET | Freq: Two times a day (BID) | ORAL | Status: DC
  Administered 2013-01-03 – 2013-01-09 (×12): 100 mg via ORAL
  Filled 2013-01-03 (×14): qty 2

## 2013-01-03 MED ORDER — NEPRO/CARBSTEADY PO LIQD: 237.0000 mL | Freq: Two times a day (BID) | ORAL | Status: DC

## 2013-01-03 MED ORDER — ACETAMINOPHEN 325 MG PO TABS
650.0000 mg | ORAL_TABLET | Freq: Four times a day (QID) | ORAL | Status: DC | PRN
  Administered 2013-01-03 – 2013-01-09 (×5): 650 mg via ORAL
  Filled 2013-01-03 (×5): qty 2

## 2013-01-03 MED ORDER — BOOST / RESOURCE BREEZE PO LIQD
1.0000 | Freq: Two times a day (BID) | ORAL | Status: DC
  Administered 2013-01-03: 1 via ORAL

## 2013-01-03 NOTE — Consult Note (Signed)
[name]: 10:15 AM 01/03/2013   Referring Provider: Vivi Barrack MD, internal Medicine resident Primary Care Physician:  [name] Maes, MD Primary Gastroenterologist:  [name]. Diamantina [name] in [place].  [name] 11/13Arlyce [name] 1/14, [name] 10/2012:  All inpt consults for anemia.    Reason for Consultation:  Recurrent, severe anemia in non-compliant pt with ESRD  HPI: [name] is a 57 y.o. male.  Has ESRD and relocated to [facility] from [place] in November 2013. Hep C positive. Seen as inpt in November 2013 by [name] for normocytic anemia, heme + stool. Records from GI [name] reviewed and no further workup pursued. Bone marrow biopsy 03/07/13 ordered by [name] but it was never performed due to pt refusal and non-follow up with oncology. Misses many of his outpt follow up appts and dialysis sessions.  Transfusions of 3 units blood in Jan 2014.  At that time had limited bleeding per rectum. No colonoscopy performed as it had been done in 08/2010 (6 mm sigmoid polyp removed).   Seen 10/2012 by [name] [name] when Hgb dropped to 7.2 and pt given 2 units PRBCs.  Again declined capsule endoscopy. He admitted to limited Goody's powder use.  Did not pursue follow up with [name] as per discharge recommendation (in the pt's favor: attending staff did not arrange an appt) Has not filled the prescription for Protonix since 09/2012.  Admitted with acute resp failure to [facility] in August 2014. Had bronchoscopy which was negative for AFB smears.   Has been cocaine + on tox screening in 02/2013 and 10/2012.  It is pending now, need urine specimen to send. Pt admits to smoking Crack 3 weeks ago.   On chronic Aranesp at dialysis. However he had only showed up for and partially completed one session (sept 11) in the last 2 weeks. He presented to dialysis center 9/16 with fatigue, DOE, dizziness. Hgb of 3.9 on dialysis lab work.  Transfused 3  units PRBCs at forshortened dialysis session 9/16. Will receive one more unit today.  Not using ASA or NSAIDs.  Stools brown, daily.  No nausea, no anorexia.    From what pt tells me, it sounds like his housing situation is tenuous, that he wears out his welcome with friends/associates in [place] and heads back to [place] where the same cycle occurs.   Past Medical History  Diagnosis Date  . Hypertension   . Anemia, chronic disease   . CHF (congestive heart failure)     diastolic.  EF 60 - 65% per [facility] eco 11/2011  . Cocaine abuse     mentioned in notes from [place]  . Hepatitis C antibody test positive     was HIV negative, 02/28/12  . Hepatitis B core antibody positive     03/01/10  . Positive QuantiFERON-TB Gold test     11/2011  . Helicobacter pylori gastritis     not defined if this was treated  . Polyp of colon, adenomatous     May 2012.  [name] [name] in [place]  . Shortness of breath   . Hematochezia   . Head injury, closed, with concussion   . History of blood transfusion     "last one was 2 days ago" (11/09/2012)  . Type II diabetes mellitus     on oral pills only  . Arthritis     "right shoulder" (11/09/2012)  . Seizure disorder     questionable history of - will need to clarify with PCP  .  Headache(784.0)     "q other day" (11/09/2012)  . ESRD (end stage renal disease) on dialysis since 2012    "[facility]; TTS" (11/09/2012)    Past Surgical History  Procedure Laterality Date  . Shoulder open rotator cuff repair Right   . Total knee arthroplasty Left   . Bascilic vein transposition  03/07/2012    Procedure: BASCILIC VEIN TRANSPOSITION;  Surgeon: [name] Hertz, MD;  Location: [facility] OR;  Service: Vascular;  Laterality: Left;  First Stage  . Insertion of dialysis catheter      right chest  . Bascilic vein transposition Left 05/31/2012    Procedure: BASCILIC VEIN TRANSPOSITION;  Surgeon: [name] Hertz, MD;  Location: [facility] OR;  Service: Vascular;  Laterality: Left;   Left 2nd Stage Basilic Vein Transposition with gortex graft revision using 45mmx10cm graft    Prior to Admission medications  This list may not be accurate.   Medication Sig Start Date End Date Taking? Authorizing Provider  calcium acetate (PHOSLO) 667 MG capsule Take 2,001 mg by mouth 3 (three) times daily with meals.   Yes Historical Provider, MD  folic acid (FOLVITE) 1 MG tablet Take 1 mg by mouth daily.   Yes Historical Provider, MD  hydrALAZINE (APRESOLINE) 100 MG tablet Take 100 mg by mouth 2 (two) times daily.   Yes Historical Provider, MD  levETIRAcetam (KEPPRA) 1000 MG tablet Take 1,000 mg by mouth 2 (two) times daily.   Yes Historical Provider, MD  Multiple Vitamins-Minerals (MULTIVITAMIN WITH MINERALS) tablet Take 1 tablet by mouth daily.   Yes Historical Provider, MD         ethyl chloride spray Apply 1 application topically as needed.    Historical Provider, MD    Scheduled Meds: . calcium acetate  2,001 mg Oral TID WC  . darbepoetin (ARANESP) injection - DIALYSIS  200 mcg Intravenous Q Tue-HD  . doxercalciferol  4 mcg Intravenous Q T,Th,Sa-HD  . feeding supplement (NEPRO CARB STEADY)  237 mL Oral BID BM  . folic acid  1 mg Oral Daily  . levETIRAcetam  1,000 mg Oral BID  . multivitamin with minerals  1 tablet Oral Daily  . pantoprazole  40 mg Oral BID  . sodium chloride  3 mL Intravenous Q12H   Infusions:   PRN Meds: acetaminophen   Allergies as of 01/02/2013 - Review Complete 01/02/2013  Allergen Reaction Noted  . Reglan [metoclopramide] Other (See Comments) 03/02/2012    Family History  Problem Relation Age of Onset  . Diabetes Mother   . Hypertension Mother   . Stroke Mother   . Kidney failure Mother     History   Social History  . Marital Status: Single    Spouse Name: N/A    Number of Children: N/A  . Years of Education: N/A   Occupational History  . Unemployed.    Social History Main Topics  . Smoking status: Current Every Day Smoker -- 0.50  packs/day for 15 years    Types: Cigarettes  . Smokeless tobacco: Never Used  . Alcohol Use: Yes     Comment: 11/09/2012 "been stopped drinking 1-2 yr ago"  . Drug Use: Yes    Special: "Crack" cocaine, Marijuana     Comment: 11/09/2012 "used weed > 20 yr ago; smokes crack-cocaine  . Sexual Activity: Yes   Other Topics Concern  . Not on file   Social History Narrative   Per HPI as to housing situation.    REVIEW OF SYSTEMS:  No recent weight loss.  137 # in 10/2012, 136 # currently. Weakness, dizziness improved post transfusions and dialysis.  headache, visual changes, chest pain, nausea, vomiting, diarrhea, constipation, myalgias, arthralgias, leg swelling, changes in po intake. He denies any gross bleeding, including bloody stools, dark or tarry stools, hematemesis, hemoptysis.  No epistaxis.    PHYSICAL EXAM: Vital signs in last 24 hours: Temp:  [97.4 F (36.3 C)-99.2 F (37.3 C)] 98.3 F (36.8 C) (09/17 0932) Pulse Rate:  [76-117] 76 (09/17 0740) Resp:  [14-27] 17 (09/17 0932) BP: (124-195)/(57-103) 142/63 mmHg (09/17 0932) SpO2:  [96 %-100 %] 100 % (09/17 0918) Weight:  [59.25 kg (130 lb 10 oz)-61.9 kg (136 lb 7.4 oz)] 59.25 kg (130 lb 10 oz) (09/16 1850)  General: looks thin, chronically ill but comfortable Head:  No swelling or signs of trauma  Eyes:  No icterus. + onj pallor Ears:  Slightly HOH  Nose:  No discharge  Mouth:  Clear, moist,  Scant rotten teeth. Neck:  No JVD or mass Lungs:  Clear bil  No cough or dyspnea Heart: RRR.  No MRG Abdomen: NT, ND. No mass or HSM.  Rectal: not done.  Musc/Skeltl: no joint swelling of deformity.  Extremities: No pedal or UE edema  Neurologic: Oriented x 3. No tremor.  No extremity weakness.   Skin: No rash or sores  Tattoos: None seen  Nodes: No cervical adenopathy  Psych: Cooperative, engaged.     Intake/Output from previous day: 09/16 0701 - 09/17 0700 In: 1400 [Blood:1400] Out: 3520 [Urine:100] Intake/Output this  shift:    LAB RESULTS:  Recent Labs  01/02/13 1430 01/02/13 2103 01/03/13 0550  WBC 5.0 5.9 6.4  HGB 4.8* 7.1* 6.5*  HCT 14.6* 20.6* 19.2*  PLT 212 228 215  MCV    86    Ref. Range 11/07/2012 20:12  Iron Latest Range: 42-135 ug/dL 41 (L)  UIBC Latest Range: 125-400 ug/dL 119  TIBC Latest Range: 215-435 ug/dL 147  Saturation Ratios Latest Range: 20-55 % 16 (L)  Ferritin Latest Range: 22-322 ng/mL 48  Folate No range found 7.4    BMET Lab Results  Component Value Date   NA 136 01/03/2013   NA 137 01/02/2013   NA 138 01/02/2013   K 4.2 01/03/2013   K 4.9 01/02/2013   K 5.1 01/02/2013   CL 99 01/03/2013   CL 97 01/02/2013   CL 98 01/02/2013   CO2 27 01/03/2013   CO2 23 01/02/2013   CO2 24 01/02/2013   GLUCOSE 94 01/03/2013   GLUCOSE 150* 01/02/2013   GLUCOSE 72 01/02/2013   BUN 48* 01/03/2013   BUN 114* 01/02/2013   BUN 110* 01/02/2013   CREATININE 5.07* 01/03/2013   CREATININE 11.00* 01/02/2013   CREATININE 10.43* 01/02/2013   CALCIUM 7.0* 01/03/2013   CALCIUM 7.4* 01/02/2013   CALCIUM 7.5* 01/02/2013   LFT  Recent Labs  01/02/13 0950 01/02/13 1430 01/03/13 0550  PROT 5.9*  --   --   ALBUMIN 2.8* 2.6* 2.4*  AST 19  --   --   ALT 12  --   --   ALKPHOS 34*  --   --   BILITOT 0.2*  --   --    PT/INR Lab Results  Component Value Date   INR 1.00 01/02/2013   INR 1.16 05/05/2012   INR 1.05 05/04/2012    Drugs of Abuse     Component Value Date/Time   LABOPIA NONE DETECTED 11/07/2012 1903  COCAINSCRNUR POSITIVE* 11/07/2012 1903   LABBENZ NONE DETECTED 11/07/2012 1903   AMPHETMU NONE DETECTED 11/07/2012 1903   THCU NONE DETECTED 11/07/2012 1903   LABBARB NONE DETECTED 11/07/2012 1903     RADIOLOGY STUDIES: Dg Chest 2 View 01/02/2013   *RADIOLOGY REPORT*  Clinical Data: Weakness  CHEST - 2 VIEW  Comparison: 12/19/2012  Findings: The cardiac shadow is stable.  Some left basilar atelectasis is noted new from prior study.  The lungs are otherwise clear.  Mild hyperinflation is  again noted.  No acute bony abnormality is seen.  IMPRESSION: New mild atelectasis in the left base.  No focal infiltrate is seen.   Original Report Authenticated By: Alcide Clever, M.D.    ENDOSCOPIC STUDIES: In [place]:  Enteroscopy 03/26/2011 [name] Samuella Cota. Negative study. He refused colonoscopy. Capsule endo entertained  EGD May 2012: Small HH,  EGD 2011: gastritis  Colonoscopy May 2012: 6mm adenomatous sigmoid polyp   IMPRESSION: *  Acute on chronic anemia.  Recurrent.  Per Epic records has received transfusion in Jan and July 2014 and again yesterday and today.  Anemia is multifactorial in non-compliant pt with ESRD.   Refused bone marrow bx in past, refused capsule endoscopy in past.   *  Hx heme + stool on 4/4 occasions from 02/2012 - 10/2012.   03/2011 enteroscopy, 08/2010 EGD and colonoscopy, EGD 2011: small  HH, gastritis,  adenomatous sigmoid polyp.  Home med list includes daily Protonix. Last refilled this in 09/2012.  *  DM 2.  Not requiring meds or insulin. *  ESRD *  Cocaine abuse.    PLAN: *  Per [name].  *  Pt not averse to having capsule endo at present.  Did not ask him about bone marrow Bx.     LOS: 1 day   Jennye Moccasin  01/03/2013, 10:15 AM Pager: [phone]  GI ATTENDING  History, laboratories, x-rays, prior endoscopy reports reviewed. Patient personally seen and examined. Agree with H&P as outlined above. Patient presents with symptomatic anemia. Has had this problem recurrently for years. He has had fairly extensive GI workup without significant abnormalities found. His anemia is normocytic. Previously seen by hematology who recommended bone marrow biopsy. No history of acute or subacute bleeding. Does have positive stools. Likely due to NSAID use (prior history of gastritis). In any event, recommend that he discontinue alcohol and drug use. Also, recommend empiric PPI. No further GI workup indicated at this point. I would recommend getting hematology reinvolved. At  some point, after completion of hematology evaluation, if there is clinical indication for additional GI workup, only a capsule endoscopy is lacking. However, would not pursue that in the immediate future. Please call for questions. Will sign off.  Wilhemina Bonito. Eda Keys., M.D. [facility] Division of Gastroenterology.

## 2013-01-03 NOTE — H&P (Signed)
  Date: 01/03/2013  Patient name: [name]  Medical record number: [id number]  Date of birth: 09-25-1955   I have seen and evaluated [name] and discussed their care with the Residency Team.   Assessment and Plan: I have seen and evaluated the patient as outlined above. I agree with the formulated Assessment and Plan as detailed in the residents' admission note, with the following changes:   1. 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  He has had this issue before and been worked up, however, he has not had a capsule endoscopy or BM biopsy as previously recommended.  His H/H have appropriately responded to transfusion so far, he will get 4 total units of PRBCs.  Goal Hgb will be > 7.  GI consult pending.  Continue Protonix.  FOBT pending.  Trending CBC at least twice daily.  If H/H remain stable, may be okay for the floor depending on vitals.   2. ESRD - - Renal is following, will get HD while here.  Aranesp with HD.  He had missed multiple sessions of dialysis in the last 2 weeks and also missed his Aranesp which may explain some of his acute anemia.   Will monitor closely in SDU.  Other issues per resident note.   Inez Catalina, MD 9/17/201411:04 AM

## 2013-01-03 NOTE — Progress Notes (Signed)
Utilization Review Completed.   Phinley Schall, RN, BSN Nurse Case Manager  [phone]  

## 2013-01-03 NOTE — Progress Notes (Signed)
Pt reported to have positive quantiferon test ? in [place]  ? When Has had bronch (recent) with negative AFB smears Quantiferon has been repeated this admission and if positive ID would need to make recommendations re treatment./cbd

## 2013-01-03 NOTE — Progress Notes (Signed)
INITIAL NUTRITION ASSESSMENT  DOCUMENTATION CODES Per approved criteria  -Non-severe (moderate) malnutrition in the context of chronic illness   INTERVENTION: 1.  Supplements; discontinue Nepro.  Resource Breeze po BID, each supplement provides 250 kcal and 9 grams of protein.  2.  General healthful diet; encourage intake at meals as needed.  NUTRITION DIAGNOSIS: Unintended wt change related to decreased ability for self care, poor PO as evidenced by 10-15 lbs wt loss.   Monitor:  1.  Food/Beverage; pt meeting >/=90% estimated needs with tolerance. 2.  Wt/wt change; monitor trends  Reason for Assessment: MST  57 y.o. male  Admitting Dx: Normocytic anemia  ASSESSMENT: Pt admitted with recurrent severe anemia.  Pt with ESRD requiring dialysis, however misses dialysis sessions.   Pt with significant wt change of 12% in the past 6 months.   Pt states he does not like Nepro and will not drink them.  Reports going to dialysis once weekly on average because dialysis "makes him feel worse." Pt states he has had good appetite, but only "ok" intake. Pt does not expand on why his intake has been less than his usual.  He endorses wt loss.  States his usual wt is 150-160 lbs.  Nutrition Focused Physical Exam:  Subcutaneous Fat:  Orbital Region: WNL Upper Arm Region: mild-moderate wasting Thoracic and Lumbar Region: mild-moderate  Muscle:  Temple Region: WNL Clavicle Bone Region: mild-moderate Clavicle and Acromion Bone Region: mild-moderate Scapular Bone Region: WNL Dorsal Hand: WNL Patellar Region: moderate wasting Anterior Thigh Region: moderate wasting Posterior Calf Region: moderate-severe wasting  Edema: none present  Pt meets criteria for moderate MALNUTRITION in the context of chronic illness as evidenced by poor PO intake not meeting 75% of estimated need consistently, 12% wt change, and nutrition focused physical exam.  Height: Ht Readings from Last 1 Encounters:   01/02/13 5\' 9"  (1.753 m)    Weight: Wt Readings from Last 1 Encounters:  01/02/13 130 lb 10 oz (59.25 kg)    Ideal Body Weight: 72.7 kg  % Ideal Body Weight: 81%  Wt Readings from Last 10 Encounters:  01/02/13 130 lb 10 oz (59.25 kg)  11/08/12 137 lb 9.6 oz (62.415 kg)  11/08/12 137 lb 9.6 oz (62.415 kg)  06/01/12 148 lb 2.4 oz (67.2 kg)  06/01/12 148 lb 2.4 oz (67.2 kg)  05/26/12 151 lb 12.8 oz (68.856 kg)  05/06/12 147 lb 4.3 oz (66.8 kg)  03/08/12 143 lb 1.3 oz (64.9 kg)  03/08/12 143 lb 1.3 oz (64.9 kg)    Usual Body Weight: 148 lbs  % Usual Body Weight: 88%  BMI:  Body mass index is 19.28 kg/(m^2).  Estimated Nutritional Needs: Kcal: 2075 Protein: 70-80g Fluid: per MD discretion  Skin: intact  Diet Order: Renal 80-90  EDUCATION NEEDS: -Education needs addressed   Intake/Output Summary (Last 24 hours) at 01/03/13 1222 Last data filed at 01/03/13 1149  Gross per 24 hour  Intake   1400 ml  Output   3520 ml  Net  -2120 ml    Last BM: intact  Labs:   Recent Labs Lab 01/02/13 0950 01/02/13 1430 01/03/13 0550  NA 138 137 136  K 5.1 4.9 4.2  CL 98 97 99  CO2 24 23 27   BUN 110* 114* 48*  CREATININE 10.43* 11.00* 5.07*  CALCIUM 7.5* 7.4* 7.0*  PHOS  --  6.4* 4.5  GLUCOSE 72 150* 94    CBG (last 3)   Recent Labs  01/03/13 1155  GLUCAP  90    Scheduled Meds: . calcium acetate  2,001 mg Oral TID WC  . darbepoetin (ARANESP) injection - DIALYSIS  200 mcg Intravenous Q Tue-HD  . doxercalciferol  4 mcg Intravenous Q T,Th,Sa-HD  . feeding supplement (NEPRO CARB STEADY)  237 mL Oral BID BM  . folic acid  1 mg Oral Daily  . levETIRAcetam  1,000 mg Oral BID  . multivitamin with minerals  1 tablet Oral Daily  . pantoprazole  40 mg Oral BID  . sodium chloride  3 mL Intravenous Q12H    Continuous Infusions:   Past Medical History  Diagnosis Date  . Hypertension   . Anemia, chronic disease   . CHF (congestive heart failure)      diastolic.  EF 60 - 65% per [facility] eco 11/2011  . Cocaine abuse     mentioned in notes from [place]  . Hepatitis C antibody test positive     was HIV negative, 02/28/12  . Hepatitis B core antibody positive     03/01/10  . Positive QuantiFERON-TB Gold test     11/2011  . Helicobacter pylori gastritis     not defined if this was treated  . Polyp of colon, adenomatous     May 2012.  Dr [name] in [place]  . Shortness of breath   . Hematochezia   . Head injury, closed, with concussion   . History of blood transfusion     "last one was 2 days ago" (11/09/2012)  . Type II diabetes mellitus     on oral pills only  . Arthritis     "right shoulder" (11/09/2012)  . Seizure disorder     questionable history of - will need to clarify with PCP  . Headache(784.0)     "q other day" (11/09/2012)  . ESRD (end stage renal disease) on dialysis since 2012    "[facility]; TTS" (11/09/2012)    Past Surgical History  Procedure Laterality Date  . Shoulder open rotator cuff repair Right   . Total knee arthroplasty Left   . Bascilic vein transposition  03/07/2012    Procedure: BASCILIC VEIN TRANSPOSITION;  Surgeon: Fransisco Hertz, MD;  [facility] OR;  Service: Vascular;  Laterality: Left;  First Stage  . Insertion of dialysis catheter      right chest  . Bascilic vein transposition Left 05/31/2012    Procedure: BASCILIC VEIN TRANSPOSITION;  Surgeon: Fransisco Hertz, MD;  Location[facility] OR;  Service: Vascular;  Laterality: Left;  Left 2nd Stage Basilic Vein Transposition with gortex graft revision using 57mmx10cm graft    Loyce Dys, MS RD LDN Clinical Inpatient Dietitian Pager: [phone] Weekend/After hours pager: [phone]

## 2013-01-03 NOTE — ED Provider Notes (Signed)
I have personally seen and examined the patient.  I have discussed the plan of care with the resident.  I have reviewed the documentation on PMH/FH/Soc. History.  I have reviewed the documentation of the resident and agree.  I have reviewed and agree with the ECG interpretation(s) documented by the resident.  I re-evaluated patient while in the ED, stabilized in the ED, he is ill appearing with significant anemia and required emergent transfusion and admission.  I also spoke to nephrology team about this care  CRITICAL CARE Performed by: Joya Gaskins Total critical care time: 32 Critical care time was exclusive of separately billable procedures and treating other patients. Critical care was necessary to treat or prevent imminent or life-threatening deterioration. Critical care was time spent personally by me on the following activities: development of treatment plan with patient and/or surrogate as well as nursing, discussions with consultants, evaluation of patient's response to treatment, examination of patient, obtaining history from patient or surrogate, ordering and performing treatments and interventions, ordering and review of laboratory studies, ordering and review of radiographic studies, pulse oximetry and re-evaluation of patient's condition.   Joya Gaskins, MD 01/03/13 [id number]

## 2013-01-03 NOTE — Progress Notes (Addendum)
Subjective: Feels better this AM Wants more to eat No CP or SOB. No bleeding reported Received a total of 3 units of PRBC's with dialysis yesterday and is to get another unit today  Objective Vital signs in last 24 hours: Filed Vitals:   01/02/13 2100 01/03/13 0000 01/03/13 0400 01/03/13 0740  BP:  188/77 146/57 141/63  Pulse:  81 80 76  Temp:  97.8 F (36.6 C) 97.6 F (36.4 C) 98.8 F (37.1 C)  TempSrc:  Oral Oral Oral  Resp:  19 16 14   Height: 5\' 9"  (1.753 m)     Weight:      SpO2:  99% 100% 100%   Weight change:   Intake/Output Summary (Last 24 hours) at 01/03/13 0902 Last data filed at 01/02/13 2130  Gross per 24 hour  Intake   1400 ml  Output   3520 ml  Net  -2120 ml   Physical Exam:  Blood pressure 141/63, pulse 76, temperature 98.8 F (37.1 C), temperature source Oral, resp. rate 14, height 5\' 9"  (1.753 m), weight 59.25 kg (130 lb 10 oz), SpO2 100.00%. Lungs clear Cardiac regular S1S2 no S2 2/6 murmur USB no rub Abd soft without focal tenderness Extremities no edema Left AVF with bruit. Bandaids still in place  Labs: Basic Metabolic Panel:  Recent Labs Lab 01/02/13 0950 01/02/13 1430 01/03/13 0550  NA 138 137 136  K 5.1 4.9 4.2  CL 98 97 99  CO2 24 23 27   GLUCOSE 72 150* 94  BUN 110* 114* 48*  CREATININE 10.43* 11.00* 5.07*  CALCIUM 7.5* 7.4* 7.0*  PHOS  --  6.4* 4.5   Liver Function Tests:  Recent Labs Lab 01/02/13 0950 01/02/13 1430 01/03/13 0550  AST 19  --   --   ALT 12  --   --   ALKPHOS 34*  --   --   BILITOT 0.2*  --   --   PROT 5.9*  --   --   ALBUMIN 2.8* 2.6* 2.4*   Recent Labs Lab 01/02/13 0950 01/02/13 1430 01/02/13 2103 01/03/13 0550  WBC 5.6 5.0 5.9 6.4  NEUTROABS 3.9  --   --   --   HGB 3.8* 4.8* 7.1* 6.5*  HCT 11.4* 14.6* 20.6* 19.2*  MCV 89.1 88.5 85.1 86.5  PLT 215 212 228 215   Recent Labs Lab 01/02/13 1430 01/02/13 2102 01/03/13 0550  TROPONINI <0.30 <0.30 <0.30   Studies/Results: Dg Chest 2  View  01/02/2013   *RADIOLOGY REPORT*  Clinical Data: Weakness  CHEST - 2 VIEW  Comparison: 12/19/2012  Findings: The cardiac shadow is stable.  Some left basilar atelectasis is noted new from prior study.  The lungs are otherwise clear.  Mild hyperinflation is again noted.  No acute bony abnormality is seen.  IMPRESSION: New mild atelectasis in the left base.  No focal infiltrate is seen.   Original Report Authenticated By: Alcide Clever, M.D.   Medications:   . calcium acetate  2,001 mg Oral TID WC  . darbepoetin (ARANESP) injection - DIALYSIS  200 mcg Intravenous Q Tue-HD  . doxercalciferol  4 mcg Intravenous Q T,Th,Sa-HD  . folic acid  1 mg Oral Daily  . levETIRAcetam  1,000 mg Oral BID  . multivitamin with minerals  1 tablet Oral Daily  . pantoprazole  40 mg Oral BID  . sodium chloride  3 mL Intravenous Q12H   Dialysis Orders: TTS @ AF  4 hrs 60.5 kg 2K/2.25Ca 400/A1.5 Heparin 1000  U AVF @ LUA  Hectorol 4 mcg Epogen 18,000 U Venofer 0  Assessment/Plan:  1. Anemia - Hgb 3.8 in ED, received total of 3 units yesterday (only got 1 unit started before coming into HD so we could only give total of 3);  previous extensive GI workup with small hiatal hernia per EGD & adenomatous sigmoid polyp per colonoscopy in 08/2010, gastritis per EGD 2011; capsule endoscopy declined in 10/2012; likely multifactorial; on outpatient Epogen 18,000 U, but misses HD; last T-sat 23% (8/20), but ferritin 1410. PRBC's as needed/Aranesp 200/week/GI to see today (per primary service) 2. ESRD - HD on TTS @ AF 3. Hypertension/volume - BP 170/75 on outpatient Hydralazine 100 mg bid; CXR negative for edema. 4. Metabolic bone disease - Last  iPTH 91; Hectorol 4 mcg, Phoslo 3 with meals. 5. Nutrition -Alb 2.4  Add nepro; continue renal diet, vitamin. 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 Quantiferon reordered for this admit - if positive would need to get ID  input re treatment 7. Hepatitis C 8. DM Type 2 9. Hx seizure disorder 10. Hx cocaine abuse    Camille Bal, MD [facility] [phone] pager 01/03/2013, 9:02 AM

## 2013-01-03 NOTE — Progress Notes (Signed)
Subjective: Patient seen at bedside. He feels well this morning. He denies chest pain, SOB, abdominal pain. His fatigue and lightheadedness are improved from yesterday after blood transfusion and HD. His last BM was yesterday, normal color and caliber. No blood or melena. We discussed bone marrow biopsy and he still refuses. [name] open however to getting a capsule endoscopy after I explained the study to him, either as an inpatient or outpatient. He will think about it and discuss with GI this afternoon when they see him.  Objective: Vital signs in last 24 hours: Filed Vitals:   01/03/13 0740 01/03/13 0746 01/03/13 0918 01/03/13 0932  BP: 141/63  124/57 142/63  Pulse: 76     Temp: 98.8 F (37.1 C)  99.2 F (37.3 C) 98.3 F (36.8 C)  TempSrc: Oral  Oral Oral  Resp: 14 14 14 17   Height:      Weight:      SpO2: 100%  100%    Weight change:   Intake/Output Summary (Last 24 hours) at 01/03/13 1021 Last data filed at 01/02/13 2130  Gross per 24 hour  Intake   1400 ml  Output   3520 ml  Net  -2120 ml    Physical Exam  Constitutional: [name] oriented to person, place, and time and well-developed, well-nourished, and in no distress.  HENT:  Head: Normocephalic and atraumatic.  Eyes: EOM are normal. Pupils are equal, round, and reactive to light.  Neck: Normal range of motion. Neck supple.  Cardiovascular: Normal rate, regular rhythm, normal heart sounds and intact distal pulses. Exam reveals no gallop and no friction rub.  No murmur heard.  Pulmonary/Chest: Effort normal and breath sounds normal. No respiratory distress. He has no wheezes. He has no rales. He exhibits no tenderness.  Abdominal: Soft. Bowel sounds are normal. He exhibits no distension and no mass. There is no tenderness. There is no rebound and no guarding.  Musculoskeletal: Normal range of motion. He exhibits no edema and no tenderness.  Thin extremities.  Neurological: [name] alert and oriented to person, place,  and time. No cranial nerve deficit. GCS score is 15.  Skin: Skin is warm and dry. No rash noted.  LUA AV fistula present with overlying bandage, palpable thrill. Psychiatric: Mood and affect normal.    Lab Results: Basic Metabolic Panel:  Recent Labs Lab 01/02/13 1430 01/03/13 0550  NA 137 136  K 4.9 4.2  CL 97 99  CO2 23 27  GLUCOSE 150* 94  BUN 114* 48*  CREATININE 11.00* 5.07*  CALCIUM 7.4* 7.0*  PHOS 6.4* 4.5   Liver Function Tests:  Recent Labs Lab 01/02/13 0950 01/02/13 1430 01/03/13 0550  AST 19  --   --   ALT 12  --   --   ALKPHOS 34*  --   --   BILITOT 0.2*  --   --   PROT 5.9*  --   --   ALBUMIN 2.8* 2.6* 2.4*   CBC:  Recent Labs Lab 01/02/13 0950  01/02/13 2103 01/03/13 0550  WBC 5.6  < > 5.9 6.4  NEUTROABS 3.9  --   --   --   HGB 3.8*  < > 7.1* 6.5*  HCT 11.4*  < > 20.6* 19.2*  MCV 89.1  < > 85.1 86.5  PLT 215  < > 228 215  < > = values in this interval not displayed. Cardiac Enzymes:  Recent Labs Lab 01/02/13 1430 01/02/13 2102 01/03/13 0550  TROPONINI <0.30 <0.30 <0.30   Coagulation:  Recent Labs Lab 01/02/13 0950  LABPROT 13.0  INR 1.00   UDS, FOBT, Quantiferon still pending  Micro Results: Recent Results (from the past 240 hour(s))  MRSA PCR SCREENING     Status: None   Collection Time    01/02/13  2:56 PM      Result Value Range Status   MRSA by PCR NEGATIVE  NEGATIVE Final   Comment:            The GeneXpert MRSA Assay (FDA     approved for NASAL specimens     only), is one component of a     comprehensive MRSA colonization     surveillance program. It is not     intended to diagnose MRSA     infection nor to guide or     monitor treatment for     MRSA infections.   Studies/Results: Dg Chest 2 View  01/02/2013   *RADIOLOGY REPORT*  Clinical Data: Weakness  CHEST - 2 VIEW  Comparison: 12/19/2012  Findings: The cardiac shadow is stable.  Some left basilar atelectasis is noted new from prior study.  The lungs are  otherwise clear.  Mild hyperinflation is again noted.  No acute bony abnormality is seen.  IMPRESSION: New mild atelectasis in the left base.  No focal infiltrate is seen.   Original Report Authenticated By: Alcide Clever, M.D.   Medications: I have reviewed the patient's current medications. Scheduled Meds: . calcium acetate  2,001 mg Oral TID WC  . darbepoetin (ARANESP) injection - DIALYSIS  200 mcg Intravenous Q Tue-HD  . doxercalciferol  4 mcg Intravenous Q T,Th,Sa-HD  . feeding supplement (NEPRO CARB STEADY)  237 mL Oral BID BM  . folic acid  1 mg Oral Daily  . levETIRAcetam  1,000 mg Oral BID  . multivitamin with minerals  1 tablet Oral Daily  . pantoprazole  40 mg Oral BID  . sodium chloride  3 mL Intravenous Q12H   Continuous Infusions:  PRN Meds:.acetaminophen Assessment/Plan: Karma Hiney is a 57 y.o. male with PMH ESRD on dialysis (TTS), HTN, anemia of chronic disease, cocaine abuse, tobacco and cocaine abuse who presents from dialysis after lab work showed a hemoglobin of 3.9.  #Acute on chronic normocytic anemia - Hgb 3.8>4.8 s/p 1U PRBC>7.1 s/p 3U PRBC>6.5 this morning. Our transfusion goal is Hgb>7. Troponins have been negative x3. During prior admissions his chronic anemia was thought to be multifactorial from chronic disease, chronic renal insufficiency, iron deficiency, and occult gastrointestinal blood loss.  - Will transfuse another 1U this morning - Follow up post-transfusion CBC; if >7, will likely transfer patient out of step down - GI on board, will follow up recs, pt seems to be open to capsule study this admission - Patient still refusing bone marrow biopsy - Cardiac monitoring  - Protonix 40mg  BID po  - Folate 1mg  and MV daily  - Follow up UDS, FOBT   #ESRD (end stage renal disease) on dialysis - On TTS schedule. BUN/Cr 114/11>48/5.07 s/p HD last night. Patient still makes some urine. He has been noncompliant with his dialysis, only attending 1 session in the  past 2 weeks prior to admission. K is 4.9>4.2 s/p HD. CXR negative for pulmonary edema. - Appreciate renal recs  - Aranesp (darbepoetin alfa) with dialysis - Hectorol 4 mcg with dialysis, Phoslo TID with meals  - Adding nepro feeding supplement between meals 2/2 low albumin - Renal diet  -  Follow up daily renal function panel   #Diabetes mellitus - Last A1C 5.0 on 11/07/12. Not on medications or insulin. - Will follow BMPs and add CBG monitoring if needed   #Hypertension - BPs 120-140s/50-60s s/p HD. He takes hydralazine 100mg  po BID at home.  - Holding hydralazine, will continue to monitor BP and re-start if needed  #Seizure disorder - Stable. No seizure activity.  - Continue home keppra   #Cocaine abuse - Patient reports he last abused crack cocaine 3 weeks ago.  - Follow up UDS   #DVT PPX - SCDs  Dispo: Disposition is deferred at this time, awaiting improvement of current medical problems.  Anticipated discharge in approximately 1-3 day(s).   The patient does have a current PCP Dyke Maes, MD) and does need an [facility] hospital follow-up appointment after discharge.  The patient does not have transportation limitations that hinder transportation to clinic appointments.  .Services Needed at time of discharge: Y = Yes, Blank = No PT:   OT:   RN:   Equipment:   Other:     LOS: 1 day   Vivi Barrack, MD 01/03/2013, 10:21 AM

## 2013-01-04 LAB — RENAL FUNCTION PANEL
CO2: 28 mEq/L (ref 19–32)
Chloride: 97 mEq/L (ref 96–112)
GFR calc Af Amer: 9 mL/min — ABNORMAL LOW (ref >90)
GFR calc non Af Amer: 8 mL/min — ABNORMAL LOW (ref >90)
Potassium: 4.2 mEq/L (ref 3.5–5.1)
Sodium: 136 mEq/L (ref 135–145)

## 2013-01-04 LAB — PREPARE RBC (CROSSMATCH)

## 2013-01-04 LAB — CBC
Hemoglobin: 6.5 g/dL — CL (ref 13.0–17.0)
Hemoglobin: 7.5 g/dL — ABNORMAL LOW (ref 13.0–17.0)
MCHC: 33.5 g/dL (ref 30.0–36.0)
MCHC: 33.8 g/dL (ref 30.0–36.0)
RBC: 2.26 MIL/uL — ABNORMAL LOW (ref 4.22–5.81)
RBC: 2.58 MIL/uL — ABNORMAL LOW (ref 4.22–5.81)

## 2013-01-04 LAB — GLUCOSE, CAPILLARY
Glucose-Capillary: 88 mg/dL (ref 70–99)
Glucose-Capillary: 112 mg/dL — ABNORMAL HIGH (ref 70–99)

## 2013-01-04 MED ORDER — DOXERCALCIFEROL 4 MCG/2ML IV SOLN
INTRAVENOUS | Status: AC
  Administered 2013-01-04: 4 ug via INTRAVENOUS
  Filled 2013-01-04: qty 2

## 2013-01-04 NOTE — Progress Notes (Signed)
CRITICAL VALUE ALERT  Critical value received:  Hemoblobin 6.5  Date of notification:  01/04/13  Time of notification:  0518  Critical value read back:yes  Nurse who received alert:  Ferd Hibbs  MD notified (1st page):  Teaching Service  Time of first page:  0534  MD notified (2nd page):  Time of second page:  Responding MD:  Rocco Serene, MD  Time MD responded:  [phone]

## 2013-01-04 NOTE — Progress Notes (Signed)
Spoke with the patient about getting a bone marrow biopsy. Provided him with literature on the procedure. [name] agreeing at this time to proceed. The order has been placed.  I also spoke with Dr. Rosie Fate of heme/onc, and told him the patient's history as well as how Dr. Truett Perna has recommended bone marrow biopsy in the past, but this is the first time the patient has agreed to get the study. He will look over the patient's records to see if he has any further recommendations, then formally see the patient after the results of his bone marrow biopsy are back.  Vivi Barrack, MD [phone]

## 2013-01-04 NOTE — Progress Notes (Signed)
Admission note:   Arrival Method: Transfer from [facility] Mental Status: A&Ox4 Telemetry: Discontinued on transfer  Skin: BLE dry/flaky. Pt refusing to have buttocks/sacrum assessed.  Tubes: N/A IV: RUA NSL 9/16 Pain: 8/10 medicated with prn tylenol  Family: No one at bedside Living Situation: From home Safety Measures: Call bell within reach. Phone at bedside. 5W Orientation: Oriented to unit and surroundings. Belongings at bedside.   Kathlene November, Leina Babe [place]well

## 2013-01-04 NOTE — Progress Notes (Signed)
  Date: 01/04/2013  Patient name: [name]  Medical record number: [id number]  Date of birth: 31-Aug-1955   This patient has been seen and the plan of care was discussed with the house staff. Please see their note for complete details. I concur with their findings with the following additions/corrections:  BM Bx pending.  Will follow up post transfusion CBC.   Inez Catalina, MD 01/04/2013, 2:07 PM

## 2013-01-04 NOTE — Progress Notes (Signed)
Subjective: Feels better this AM Wants more to eat No CP or SOB. No bleeding reported Received a 4th unit of PRBC's yesterday Objective Vital signs in last 24 hours: Filed Vitals:   01/03/13 2000 01/04/13 0000 01/04/13 0007 01/04/13 0415  BP: 152/65 140/49 140/49 151/57  Pulse:   79 78  Temp: 98.6 F (37 C)  98.4 F (36.9 C) 98.4 F (36.9 C)  TempSrc: Oral  Oral Oral  Resp: 26 16 26 14   Height:      Weight:    62 kg (136 lb 11 oz)  SpO2:   98% 100%   Weight change: 0.1 kg (3.5 oz)  Intake/Output Summary (Last 24 hours) at 01/04/13 0635 Last data filed at 01/03/13 1149  Gross per 24 hour  Intake    350 ml  Output      0 ml  Net    350 ml   Physical Exam:  BP 151/57  Pulse 78  Temp(Src) 98.4 F (36.9 C) (Oral)  Resp 14  Ht 5\' 9"  (1.753 m)  Wt 62 kg (136 lb 11 oz)  BMI 20.18 kg/m2  SpO2 100% Lungs clear Cardiac regular S1S2 no S2 2/6 murmur USB no rub Abd soft and non tender Extremities no edema Left AVF with bruit.   Labs: Basic Metabolic Panel:  Recent Labs Lab 01/02/13 0950 01/02/13 1430 01/03/13 0550 01/04/13 0430  NA 138 137 136 136  K 5.1 4.9 4.2 4.2  CL 98 97 99 97  CO2 24 23 27 28   GLUCOSE 72 150* 94 119*  BUN 110* 114* 48* 74*  CREATININE 10.43* 11.00* 5.07* 7.28*  CALCIUM 7.5* 7.4* 7.0* 7.2*  PHOS  --  6.4* 4.5 6.4*     Recent Labs Lab 01/02/13 0950 01/02/13 1430 01/03/13 0550 01/04/13 0430  AST 19  --   --   --   ALT 12  --   --   --   ALKPHOS 34*  --   --   --   BILITOT 0.2*  --   --   --   PROT 5.9*  --   --   --   ALBUMIN 2.8* 2.6* 2.4* 2.4*   Recent Labs Lab 01/02/13 0950  01/02/13 2103 01/03/13 0550 01/03/13 1442 01/04/13 0430  WBC 5.6  < > 5.9 6.4 6.1 5.4  NEUTROABS 3.9  --   --   --   --   --   HGB 3.8*  < > 7.1* 6.5* 7.4* 6.5*  HCT 11.4*  < > 20.6* 19.2* 21.4* 19.4*  MCV 89.1  < > 85.1 86.5 84.9 85.8  PLT 215  < > 228 215 194 201  < > = values in this interval not displayed.  Recent Labs Lab 01/02/13 1430  01/02/13 2102 01/03/13 0550  TROPONINI <0.30 <0.30 <0.30   Studies/Results: Dg Chest 2 View  01/02/2013   *RADIOLOGY REPORT*  Clinical Data: Weakness  CHEST - 2 VIEW  Comparison: 12/19/2012  Findings: The cardiac shadow is stable.  Some left basilar atelectasis is noted new from prior study.  The lungs are otherwise clear.  Mild hyperinflation is again noted.  No acute bony abnormality is seen.  IMPRESSION: New mild atelectasis in the left base.  No focal infiltrate is seen.   Original Report Authenticated By: Alcide Clever, M.D.   Medications:   . calcium acetate  2,001 mg Oral TID WC  . darbepoetin (ARANESP) injection - DIALYSIS  200 mcg Intravenous Q Tue-HD  .  doxercalciferol  4 mcg Intravenous Q T,Th,Sa-HD  . feeding supplement  1 Container Oral BID BM  . folic acid  1 mg Oral Daily  . hydrALAZINE  100 mg Oral BID  . levETIRAcetam  1,000 mg Oral BID  . multivitamin with minerals  1 tablet Oral Daily  . pantoprazole  40 mg Oral BID  . sodium chloride  3 mL Intravenous Q12H   Dialysis Orders: TTS @ AF  4 hrs 60.5 kg 2K/2.25Ca 400/A1.5 Heparin 1000 U AVF @ LUA  Hectorol 4 mcg Epogen 18,000 U Venofer 0  Assessment/Plan:  1. Anemia - Hgb 3.8 in ED on admission; has rec'd a total of 4 units PRBC's.  Hb still only 6.5 this AM.   Previous extensive GI workup with small hiatal hernia per EGD & adenomatous sigmoid polyp per colonoscopy in 08/2010, gastritis per EGD 2011; capsule endoscopy declined in 10/2012; likely multifactorial; on outpatient Epogen 18,000 U, but misses HD; last T-sat 23% (8/20), but ferritin 1410. Give another unit PRBC's with HD today. Continue Aranesp 200/week; GI has signed off with no plan for further gi evaluation at this time and they want heme to see (deferring capsule endo "until later" ) 2. ESRD - HD on TTS @ AF 3. Hypertension/volume - BP 170/75 on outpatient Hydralazine 100 mg bid; CXR negative for edema. 4. Metabolic bone disease - Last  iPTH 91; Hectorol 4 mcg,  Phoslo 3 with meals. 5. Nutrition -Alb 2.4  Added nepro; continue renal diet, vitamin. 6. 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 Quantiferon reordered for this admit - if positive would need to get ID input re treatment 7. Hepatitis C 8. DM Type 2 9. Hx seizure disorder 10. Hx cocaine abuse    Camille Bal, MD [facility] [phone] pager 01/04/2013, 6:35 AM

## 2013-01-04 NOTE — Progress Notes (Signed)
Subjective: Patient seen at bedside during dialysis. [name] resting comfortably. He says he feels well this morning. He denies chest pain, SOB, abdominal pain, fatigue and lightheadedness. He had another BM yesterday, again it was normal color and caliber. No blood or melena. Denies hematuria, hemoptysis. We discussed bone marrow biopsy again. The patient is reluctant to get it because he thinks it will be painful. We explained that the area will be numbed and he should expect only some soreness afterwards, like a bruise. We told him we think the study is important to get get an idea of what's causing his refractory anemia. He said he would think about it.  Objective: Vital signs in last 24 hours: Filed Vitals:   01/04/13 0908 01/04/13 0930 01/04/13 1000 01/04/13 1030  BP: 144/49 172/82 183/82 148/43  Pulse: 74 74 72 73  Temp: 98.2 F (36.8 C)     TempSrc: Oral     Resp: 25 27 24 25   Height:      Weight:      SpO2:  100%     Weight change: 3.5 oz (0.1 kg)  Intake/Output Summary (Last 24 hours) at 01/04/13 1114 Last data filed at 01/04/13 0908  Gross per 24 hour  Intake    940 ml  Output      0 ml  Net    940 ml    Physical Exam  Constitutional: [name] oriented to person, place, and time and well-developed, well-nourished, and in no distress. Receiving dialysis. HENT:  Head: Normocephalic and atraumatic.  Eyes: EOM are normal. Pupils are equal, round, and reactive to light.  Neck: Normal range of motion. Neck supple.  Cardiovascular: Normal rate, regular rhythm, normal heart sounds and intact distal pulses. Exam reveals no gallop and no friction rub.  No murmur heard.  Pulmonary/Chest: Effort normal and breath sounds normal. No respiratory distress. He has no wheezes. He has no rales. He exhibits no tenderness. Not tachypneic Abdominal: Soft. Bowel sounds are normal. He exhibits no distension and no mass. There is no tenderness. There is no rebound and no guarding.    Musculoskeletal: Normal range of motion. He exhibits no edema and no tenderness.  Thin extremities.  Neurological: [name] alert and oriented to person, place, and time. No cranial nerve deficit. GCS score is 15.  Skin: Skin is warm and dry. No rash noted.  Psychiatric: Mood and affect normal.    Lab Results: Basic Metabolic Panel:  Recent Labs Lab 01/03/13 0550 01/04/13 0430  NA 136 136  K 4.2 4.2  CL 99 97  CO2 27 28  GLUCOSE 94 119*  BUN 48* 74*  CREATININE 5.07* 7.28*  CALCIUM 7.0* 7.2*  PHOS 4.5 6.4*   Liver Function Tests:  Recent Labs Lab 01/02/13 0950  01/03/13 0550 01/04/13 0430  AST 19  --   --   --   ALT 12  --   --   --   ALKPHOS 34*  --   --   --   BILITOT 0.2*  --   --   --   PROT 5.9*  --   --   --   ALBUMIN 2.8*  < > 2.4* 2.4*  < > = values in this interval not displayed. CBC:  Recent Labs Lab 01/02/13 0950  01/03/13 1442 01/04/13 0430  WBC 5.6  < > 6.1 5.4  NEUTROABS 3.9  --   --   --   HGB 3.8*  < > 7.4*  6.5*  HCT 11.4*  < > 21.4* 19.4*  MCV 89.1  < > 84.9 85.8  PLT 215  < > 194 201  < > = values in this interval not displayed. Cardiac Enzymes:  Recent Labs Lab 01/02/13 1430 01/02/13 2102 01/03/13 0550  TROPONINI <0.30 <0.30 <0.30   Coagulation:  Recent Labs Lab 01/02/13 0950  LABPROT 13.0  INR 1.00   Quantiferon, FOBT still pending. Has not made urine for UDS in past 2 days, so it was cancelled.  Micro Results: Recent Results (from the past 240 hour(s))  MRSA PCR SCREENING     Status: None   Collection Time    01/02/13  2:56 PM      Result Value Range Status   MRSA by PCR NEGATIVE  NEGATIVE Final   Comment:            The GeneXpert MRSA Assay (FDA     approved for NASAL specimens     only), is one component of a     comprehensive MRSA colonization     surveillance program. It is not     intended to diagnose MRSA     infection nor to guide or     monitor treatment for     MRSA infections.   Studies/Results: No  results found. Medications: I have reviewed the patient's current medications. Scheduled Meds: . calcium acetate  2,001 mg Oral TID WC  . darbepoetin (ARANESP) injection - DIALYSIS  200 mcg Intravenous Q Tue-HD  . doxercalciferol  4 mcg Intravenous Q T,Th,Sa-HD  . feeding supplement  1 Container Oral BID BM  . folic acid  1 mg Oral Daily  . hydrALAZINE  100 mg Oral BID  . levETIRAcetam  1,000 mg Oral BID  . multivitamin with minerals  1 tablet Oral Daily  . pantoprazole  40 mg Oral BID  . sodium chloride  3 mL Intravenous Q12H   Continuous Infusions:  PRN Meds:.acetaminophen Assessment/Plan: [name] is a 57 y.o. male with PMH ESRD on dialysis (TTS), HTN, anemia of chronic disease, cocaine abuse, tobacco and cocaine abuse who presents from dialysis after lab work showed a hemoglobin of 3.9.  #Acute on chronic normocytic anemia - Hgb 3.8>4.8 s/p 1U PRBC>7.1 s/p +2 PRBC>6.5>7.4 s/p +1U PRBC>6.5 this morning. Our transfusion goal is Hgb>7. During prior admissions his chronic anemia was thought to be multifactorial from chronic disease, chronic renal insufficiency, iron deficiency, and occult gastrointestinal blood loss. He denies gross blood loss, dark or tarry stools.  - Will transfuse another 1U this morning with dialysis, +1U on hold - Follow up post-transfusion CBC - Appreciate GI recs, will consider outpatient capsule study - Will speak to him again about the bone marrow biopsy decision, and involve heme if we decide to proceed - If he refuses, we need to have a goals of care conversation - Protonix 40mg  BID po  - Folate 1mg  and MV daily  - Medically stable for transfer out of Step Down Unit to a med-surg bed  #ESRD (end stage renal disease) on dialysis - On TTS schedule. Getting dialysis this morning. Patient has been noncompliant with his dialysis as an outpatient, only attending 1 session in the past 2 weeks prior to admission. K is wnl. CXR negative for pulmonary edema. -  Appreciate renal recs  - Aranesp (darbepoetin alfa) with dialysis, 200/week - Hectorol 4 mcg with dialysis, Phoslo TID with meals  - Nepro feeding supplement between meals 2/2 low albumin - Renal diet  -  Follow up daily renal function panel   #?Tuberculosis exposure - Per renal he was reported to have had a positive quantiferon test in [place], [place], recently. Apparently he had a bronchoscopy with negative AFB smears in late August. - Follow up Quantiferon tb gold assay, currently in process, will call ID if positive   #Diabetes mellitus - Last A1C 5.0 on 11/07/12. Not on medications or insulin. - Will follow BMPs and add CBG monitoring if needed   #Hypertension - BP currently 140/67. Yesterday afternoon we restarted his home BP med. - Continue home hydralazine  #Seizure disorder - Stable. No seizure activity.  - Continue home keppra   #Cocaine abuse - Patient reports he last abused crack cocaine 3 weeks ago.  - UDS cancelled as patient has not made a urine sample in 2 days. Cocaine has been metabolized if it was there on admission.  #DVT PPX - SCDs  Dispo: Disposition is deferred at this time, awaiting improvement of current medical problems.  Anticipated discharge in approximately 1-3 day(s).   The patient does have a current PCP Dyke Maes, MD) and does need an [facility] hospital follow-up appointment after discharge.  The patient does not have transportation limitations that hinder transportation to clinic appointments.  .Services Needed at time of discharge: Y = Yes, Blank = No PT:   OT:   RN:   Equipment:   Other:     LOS: 2 days   Vivi Barrack, MD 01/04/2013, 11:14 AM

## 2013-01-04 NOTE — Procedures (Signed)
I have personally attended this patient's dialysis session.  To receive another unit of blood on HD (this will be his 5th unit) No heparin with HD 2K bath with K of 4.2   [name] B

## 2013-01-05 ENCOUNTER — Inpatient Hospital Stay (HOSPITAL_COMMUNITY): Payer: Medicare Other

## 2013-01-05 LAB — RENAL FUNCTION PANEL
CO2: 30 mEq/L (ref 19–32)
Chloride: 98 mEq/L (ref 96–112)
Creatinine, Ser: 4.84 mg/dL — ABNORMAL HIGH (ref 0.50–1.35)
GFR calc Af Amer: 14 mL/min — ABNORMAL LOW (ref >90)
GFR calc non Af Amer: 12 mL/min — ABNORMAL LOW (ref >90)
Potassium: 3.9 mEq/L (ref 3.5–5.1)

## 2013-01-05 LAB — CBC
MCV: 87 fL (ref 78.0–100.0)
Platelets: 168 10*3/uL (ref 150–400)
RBC: 2.46 MIL/uL — ABNORMAL LOW (ref 4.22–5.81)
WBC: 5.7 10*3/uL (ref 4.0–10.5)

## 2013-01-05 LAB — BONE MARROW EXAM

## 2013-01-05 MED ORDER — FENTANYL CITRATE 0.05 MG/ML IJ SOLN
INTRAMUSCULAR | Status: AC | PRN
  Administered 2013-01-05 (×2): 50 ug via INTRAVENOUS

## 2013-01-05 MED ORDER — MIDAZOLAM HCL 2 MG/2ML IJ SOLN
INTRAMUSCULAR | Status: AC | PRN
  Administered 2013-01-05 (×2): 1 mg via INTRAVENOUS

## 2013-01-05 NOTE — Progress Notes (Signed)
Patient ID: [name], male   DOB: 10/04/1955, 57 y.o.   MRN: [id number] Request received for CT guided bone marrow biopsy on pt with history of acute on chronic anemia (refractory) and no evidence of acute bleeding. Additional PMH as below.  Exam: pt awake/alert; chest- sl dim BS left base, right clear; heart- RRR; abd- soft,+BS, NT; ext- no edema.   Filed Vitals:   01/04/13 1754 01/04/13 2109 01/05/13 0300 01/05/13 0601  BP: 192/74 179/74  140/65  Pulse: 87 80  77  Temp: 98.2 F (36.8 C) 99.7 F (37.6 C)  98.9 F (37.2 C)  TempSrc: Oral Oral  Oral  Resp: 22 20  20   Height:      Weight: 135 lb 5.8 oz (61.4 kg)  137 lb 9.1 oz (62.4 kg)   SpO2: 96% 99%  95%   Past Medical History  Diagnosis Date  . Hypertension   . Anemia, chronic disease   . CHF (congestive heart failure)     diastolic.  EF 60 - 65% per [facility] eco 11/2011  . Cocaine abuse     mentioned in notes from [place]  . Hepatitis C antibody test positive     was HIV negative, 02/28/12  . Hepatitis B core antibody positive     03/01/10  . Positive QuantiFERON-TB Gold test     11/2011  . Helicobacter pylori gastritis     not defined if this was treated  . Polyp of colon, adenomatous     May 2012.  Dr [name] in [place]  . Shortness of breath   . Hematochezia   . Head injury, closed, with concussion   . History of blood transfusion     "last one was 2 days ago" (11/09/2012)  . Type II diabetes mellitus     on oral pills only  . Arthritis     "right shoulder" (11/09/2012)  . Seizure disorder     questionable history of - will need to clarify with PCP  . Headache(784.0)     "q other day" (11/09/2012)  . ESRD (end stage renal disease) on dialysis since 2012    "[place]; TTS" (11/09/2012)   Past Surgical History  Procedure Laterality Date  . Shoulder open rotator cuff repair Right   . Total knee arthroplasty Left   . Bascilic vein transposition  03/07/2012    Procedure: BASCILIC VEIN TRANSPOSITION;   Surgeon: Fransisco Hertz, MD;  Location: [facility] OR;  Service: Vascular;  Laterality: Left;  First Stage  . Insertion of dialysis catheter      right chest  . Bascilic vein transposition Left 05/31/2012    Procedure: BASCILIC VEIN TRANSPOSITION;  Surgeon: Fransisco Hertz, MD;  Location: [facility] OR;  Service: Vascular;  Laterality: Left;  Left 2nd Stage Basilic Vein Transposition with gortex graft revision using 26mmx10cm graft   Dg Chest 2 View  01/02/2013   *RADIOLOGY REPORT*  Clinical Data: Weakness  CHEST - 2 VIEW  Comparison: 12/19/2012  Findings: The cardiac shadow is stable.  Some left basilar atelectasis is noted new from prior study.  The lungs are otherwise clear.  Mild hyperinflation is again noted.  No acute bony abnormality is seen.  IMPRESSION: New mild atelectasis in the left base.  No focal infiltrate is seen.   Original Report Authenticated By: Alcide Clever, M.D.   Dg Chest 2 View  12/19/2012   CLINICAL DATA:  Cough, shortness of Breath.  EXAM: CHEST  2 VIEW  COMPARISON:  05/06/2012  FINDINGS: The heart size and mediastinal contours are within normal limits. Both lungs are clear. The visualized skeletal structures are unremarkable.  IMPRESSION: No active cardiopulmonary disease.   Electronically Signed   By: Charlett Nose   On: 12/19/2012 13:32  Results for orders placed during the hospital encounter of 01/02/13  MRSA PCR SCREENING      Result Value Range   MRSA by PCR NEGATIVE  NEGATIVE  CBC WITH DIFFERENTIAL      Result Value Range   WBC 5.6  4.0 - 10.5 K/uL   RBC 1.28 (*) 4.22 - 5.81 MIL/uL   Hemoglobin 3.8 (*) 13.0 - 17.0 g/dL   HCT 16.1 (*) 09.6 - 04.5 %   MCV 89.1  78.0 - 100.0 fL   MCH 29.7  26.0 - 34.0 pg   MCHC 33.3  30.0 - 36.0 g/dL   RDW 40.9 (*) 81.1 - 91.4 %   Platelets 215  150 - 400 K/uL   Neutrophils Relative % 71  43 - 77 %   Neutro Abs 3.9  1.7 - 7.7 K/uL   Lymphocytes Relative 19  12 - 46 %   Lymphs Abs 1.0  0.7 - 4.0 K/uL   Monocytes Relative 7  3 - 12 %   Monocytes  Absolute 0.4  0.1 - 1.0 K/uL   Eosinophils Relative 2  0 - 5 %   Eosinophils Absolute 0.1  0.0 - 0.7 K/uL   Basophils Relative 1  0 - 1 %   Basophils Absolute 0.0  0.0 - 0.1 K/uL  COMPREHENSIVE METABOLIC PANEL      Result Value Range   Sodium 138  135 - 145 mEq/L   Potassium 5.1  3.5 - 5.1 mEq/L   Chloride 98  96 - 112 mEq/L   CO2 24  19 - 32 mEq/L   Glucose, Bld 72  70 - 99 mg/dL   BUN 782 (*) 6 - 23 mg/dL   Creatinine, Ser 95.62 (*) 0.50 - 1.35 mg/dL   Calcium 7.5 (*) 8.4 - 10.5 mg/dL   Total Protein 5.9 (*) 6.0 - 8.3 g/dL   Albumin 2.8 (*) 3.5 - 5.2 g/dL   AST 19  0 - 37 U/L   ALT 12  0 - 53 U/L   Alkaline Phosphatase 34 (*) 39 - 117 U/L   Total Bilirubin 0.2 (*) 0.3 - 1.2 mg/dL   GFR calc non Af Amer 5 (*) >90 mL/min   GFR calc Af Amer 6 (*) >90 mL/min  PROTIME-INR      Result Value Range   Prothrombin Time 13.0  11.6 - 15.2 seconds   INR 1.00  0.00 - 1.49  CBC      Result Value Range   WBC 5.0  4.0 - 10.5 K/uL   RBC 1.65 (*) 4.22 - 5.81 MIL/uL   Hemoglobin 4.8 (*) 13.0 - 17.0 g/dL   HCT 13.0 (*) 86.5 - 78.4 %   MCV 88.5  78.0 - 100.0 fL   MCH 29.1  26.0 - 34.0 pg   MCHC 32.9  30.0 - 36.0 g/dL   RDW 69.6 (*) 29.5 - 28.4 %   Platelets 212  150 - 400 K/uL  RENAL FUNCTION PANEL      Result Value Range   Sodium 137  135 - 145 mEq/L   Potassium 4.9  3.5 - 5.1 mEq/L   Chloride 97  96 - 112 mEq/L   CO2 23  19 - 32 mEq/L  Glucose, Bld 150 (*) 70 - 99 mg/dL   BUN 130 (*) 6 - 23 mg/dL   Creatinine, Ser 86.57 (*) 0.50 - 1.35 mg/dL   Calcium 7.4 (*) 8.4 - 10.5 mg/dL   Phosphorus 6.4 (*) 2.3 - 4.6 mg/dL   Albumin 2.6 (*) 3.5 - 5.2 g/dL   GFR calc non Af Amer 5 (*) >90 mL/min   GFR calc Af Amer 5 (*) >90 mL/min  TROPONIN I      Result Value Range   Troponin I <0.30  <0.30 ng/mL  TROPONIN I      Result Value Range   Troponin I <0.30  <0.30 ng/mL  CBC      Result Value Range   WBC 5.9  4.0 - 10.5 K/uL   RBC 2.42 (*) 4.22 - 5.81 MIL/uL   Hemoglobin 7.1 (*) 13.0 -  17.0 g/dL   HCT 84.6 (*) 96.2 - 95.2 %   MCV 85.1  78.0 - 100.0 fL   MCH 29.3  26.0 - 34.0 pg   MCHC 34.5  30.0 - 36.0 g/dL   RDW 84.1 (*) 32.4 - 40.1 %   Platelets 228  150 - 400 K/uL  CBC      Result Value Range   WBC 6.4  4.0 - 10.5 K/uL   RBC 2.22 (*) 4.22 - 5.81 MIL/uL   Hemoglobin 6.5 (*) 13.0 - 17.0 g/dL   HCT 02.7 (*) 25.3 - 66.4 %   MCV 86.5  78.0 - 100.0 fL   MCH 29.3  26.0 - 34.0 pg   MCHC 33.9  30.0 - 36.0 g/dL   RDW 40.3 (*) 47.4 - 25.9 %   Platelets 215  150 - 400 K/uL  RENAL FUNCTION PANEL      Result Value Range   Sodium 136  135 - 145 mEq/L   Potassium 4.2  3.5 - 5.1 mEq/L   Chloride 99  96 - 112 mEq/L   CO2 27  19 - 32 mEq/L   Glucose, Bld 94  70 - 99 mg/dL   BUN 48 (*) 6 - 23 mg/dL   Creatinine, Ser 5.63 (*) 0.50 - 1.35 mg/dL   Calcium 7.0 (*) 8.4 - 10.5 mg/dL   Phosphorus 4.5  2.3 - 4.6 mg/dL   Albumin 2.4 (*) 3.5 - 5.2 g/dL   GFR calc non Af Amer 12 (*) >90 mL/min   GFR calc Af Amer 13 (*) >90 mL/min  TROPONIN I      Result Value Range   Troponin I <0.30  <0.30 ng/mL  GLUCOSE, CAPILLARY      Result Value Range   Glucose-Capillary 90  70 - 99 mg/dL  CBC      Result Value Range   WBC 6.1  4.0 - 10.5 K/uL   RBC 2.52 (*) 4.22 - 5.81 MIL/uL   Hemoglobin 7.4 (*) 13.0 - 17.0 g/dL   HCT 87.5 (*) 64.3 - 32.9 %   MCV 84.9  78.0 - 100.0 fL   MCH 29.4  26.0 - 34.0 pg   MCHC 34.6  30.0 - 36.0 g/dL   RDW 51.8 (*) 84.1 - 66.0 %   Platelets 194  150 - 400 K/uL  RENAL FUNCTION PANEL      Result Value Range   Sodium 136  135 - 145 mEq/L   Potassium 4.2  3.5 - 5.1 mEq/L   Chloride 97  96 - 112 mEq/L   CO2 28  19 - 32 mEq/L  Glucose, Bld 119 (*) 70 - 99 mg/dL   BUN 74 (*) 6 - 23 mg/dL   Creatinine, Ser 1.61 (*) 0.50 - 1.35 mg/dL   Calcium 7.2 (*) 8.4 - 10.5 mg/dL   Phosphorus 6.4 (*) 2.3 - 4.6 mg/dL   Albumin 2.4 (*) 3.5 - 5.2 g/dL   GFR calc non Af Amer 8 (*) >90 mL/min   GFR calc Af Amer 9 (*) >90 mL/min  CBC      Result Value Range   WBC 5.4  4.0  - 10.5 K/uL   RBC 2.26 (*) 4.22 - 5.81 MIL/uL   Hemoglobin 6.5 (*) 13.0 - 17.0 g/dL   HCT 09.6 (*) 04.5 - 40.9 %   MCV 85.8  78.0 - 100.0 fL   MCH 28.8  26.0 - 34.0 pg   MCHC 33.5  30.0 - 36.0 g/dL   RDW 81.1 (*) 91.4 - 78.2 %   Platelets 201  150 - 400 K/uL  CBC      Result Value Range   WBC 5.7  4.0 - 10.5 K/uL   RBC 2.58 (*) 4.22 - 5.81 MIL/uL   Hemoglobin 7.5 (*) 13.0 - 17.0 g/dL   HCT 95.6 (*) 21.3 - 08.6 %   MCV 86.0  78.0 - 100.0 fL   MCH 29.1  26.0 - 34.0 pg   MCHC 33.8  30.0 - 36.0 g/dL   RDW 57.8 (*) 46.9 - 62.9 %   Platelets 173  150 - 400 K/uL  CBC      Result Value Range   WBC 5.7  4.0 - 10.5 K/uL   RBC 2.46 (*) 4.22 - 5.81 MIL/uL   Hemoglobin 7.2 (*) 13.0 - 17.0 g/dL   HCT 52.8 (*) 41.3 - 24.4 %   MCV 87.0  78.0 - 100.0 fL   MCH 29.3  26.0 - 34.0 pg   MCHC 33.6  30.0 - 36.0 g/dL   RDW 01.0 (*) 27.2 - 53.6 %   Platelets 168  150 - 400 K/uL  RENAL FUNCTION PANEL      Result Value Range   Sodium 137  135 - 145 mEq/L   Potassium 3.9  3.5 - 5.1 mEq/L   Chloride 98  96 - 112 mEq/L   CO2 30  19 - 32 mEq/L   Glucose, Bld 95  70 - 99 mg/dL   BUN 49 (*) 6 - 23 mg/dL   Creatinine, Ser 6.44 (*) 0.50 - 1.35 mg/dL   Calcium 7.4 (*) 8.4 - 10.5 mg/dL   Phosphorus 3.4  2.3 - 4.6 mg/dL   Albumin 2.2 (*) 3.5 - 5.2 g/dL   GFR calc non Af Amer 12 (*) >90 mL/min   GFR calc Af Amer 14 (*) >90 mL/min  GLUCOSE, CAPILLARY      Result Value Range   Glucose-Capillary 88  70 - 99 mg/dL  GLUCOSE, CAPILLARY      Result Value Range   Glucose-Capillary 112 (*) 70 - 99 mg/dL   Comment 1 Notify RN    POCT I-STAT TROPONIN I      Result Value Range   Troponin i, poc 0.05  0.00 - 0.08 ng/mL   Comment 3           TYPE AND SCREEN      Result Value Range   ABO/RH(D) A POS     Antibody Screen NEG     Sample Expiration 01/05/2013     Unit Number [id number]  Blood Component Type RED CELLS,LR     Unit division 00     Status of Unit ISSUED,FINAL     Transfusion Status OK TO  TRANSFUSE     Crossmatch Result Compatible     Unit Number [id number]     Blood Component Type RED CELLS,LR     Unit division 00     Status of Unit ISSUED,FINAL     Transfusion Status OK TO TRANSFUSE     Crossmatch Result Compatible     Unit Number [id number]     Blood Component Type RED CELLS,LR     Unit division 00     Status of Unit ISSUED,FINAL     Transfusion Status OK TO TRANSFUSE     Crossmatch Result Compatible     Unit Number [id number]     Blood Component Type RED CELLS,LR     Unit division 00     Status of Unit ISSUED,FINAL     Transfusion Status OK TO TRANSFUSE     Crossmatch Result Compatible     Unit Number [id number]     Blood Component Type RED CELLS,LR     Unit division 00     Status of Unit ISSUED,FINAL     Transfusion Status OK TO TRANSFUSE     Crossmatch Result Compatible     Unit Number [id number]     Blood Component Type RED CELLS,LR     Unit division 00     Status of Unit ALLOCATED     Transfusion Status OK TO TRANSFUSE     Crossmatch Result Compatible    PREPARE RBC (CROSSMATCH)      Result Value Range   Order Confirmation ORDER PROCESSED BY BLOOD BANK    PREPARE RBC (CROSSMATCH)      Result Value Range   Order Confirmation ORDER PROCESSED BY BLOOD BANK    PREPARE RBC (CROSSMATCH)      Result Value Range   Order Confirmation ORDER PROCESSED BY BLOOD BANK    PREPARE RBC (CROSSMATCH)      Result Value Range   Order Confirmation ORDER PROCESSED BY BLOOD BANK    PREPARE RBC (CROSSMATCH)      Result Value Range   Order Confirmation BB SAMPLE OR UNITS ALREADY AVAILABLE     A/P: Pt with hx of acute on chronic anemia (refractory), no evidence of acute bleed. Plan is for CT guided bone marrow biopsy today. Details/risks of procedure d/w pt with his understanding and consent.

## 2013-01-05 NOTE — Care Management Note (Addendum)
    Page 1 of 1   01/09/2013     5:48:30 PM   CARE MANAGEMENT NOTE 01/09/2013  Patient:  [name],[name]   Account Number:  [id number]  Date Initiated:  01/05/2013  Documentation initiated by:  Letha Cape  Subjective/Objective Assessment:   dx nomocytic anemai  admit- from home     Action/Plan:   pt eval- no pt f/u needed.   Anticipated DC Date:  01/09/2013   Anticipated DC Plan:  HOME/SELF CARE  In-house referral  Clinical Social Worker      DC Planning Services  CM consult      Choice offered to / List presented to:             Status of service:  Completed, signed off Medicare Important Message given?   (If response is "NO", the following Medicare IM given date fields will be blank) Date Medicare IM given:   Date Additional Medicare IM given:    Discharge Disposition:  HOME/SELF CARE  Per UR Regulation:  Reviewed for med. necessity/level of care/duration of stay  If discussed at Long Length of Stay Meetings, dates discussed:   01/09/2013    Comments:  01/09/13 Letha Cape RN, BSN [phone] patient hgb is stable, patient for dc to home today, no physical therapy needs.  01/05/13 16:25 Letha Cape RN, BSN [phone] patient is from home, NCM will continue to follow for dc needs.

## 2013-01-05 NOTE — Progress Notes (Signed)
Subjective: Feels better this AM Wants more to eat No CP or SOB. No bleeding reported Received a 5th unit of PRBC's with HD yesterday Just back from BM bx at present.  Objective Vital signs in last 24 hours: Filed Vitals:   01/05/13 1113 01/05/13 1131 01/05/13 1145 01/05/13 1203  BP: 143/54 154/56 168/87 163/63  Pulse: 73 73 78 73  Temp: 98.2 F (36.8 C) 98 F (36.7 C) 97.2 F (36.2 C) 97.4 F (36.3 C)  TempSrc: Oral Oral Oral Oral  Resp: 20 20 20 20   Height:      Weight:      SpO2: 97% 99% 100% 99%   Weight change: -1.7 kg (-3 lb 12 oz)  Intake/Output Summary (Last 24 hours) at 01/05/13 1218 Last data filed at 01/05/13 0900  Gross per 24 hour  Intake    240 ml  Output    200 ml  Net     40 ml   Physical Exam:  BP 163/63  Pulse 73  Temp(Src) 97.4 F (36.3 C) (Oral)  Resp 20  Ht 5\' 9"  (1.753 m)  Wt 62.4 kg (137 lb 9.1 oz)  BMI 20.31 kg/m2  SpO2 99% Lungs clear Cardiac regular S1S2 no S2 2/6 murmur USB no rub Abd soft and non tender Extremities no edema Left AVF with bruit.   Labs: Basic Metabolic Panel:  Recent Labs Lab 01/02/13 0950 01/02/13 1430 01/03/13 0550 01/04/13 0430 01/05/13 0555  NA 138 137 136 136 137  K 5.1 4.9 4.2 4.2 3.9  CL 98 97 99 97 98  CO2 24 23 27 28 30   GLUCOSE 72 150* 94 119* 95  BUN 110* 114* 48* 74* 49*  CREATININE 10.43* 11.00* 5.07* 7.28* 4.84*  CALCIUM 7.5* 7.4* 7.0* 7.2* 7.4*  PHOS  --  6.4* 4.5 6.4* 3.4     Recent Labs Lab 01/02/13 0950  01/03/13 0550 01/04/13 0430 01/05/13 0555  AST 19  --   --   --   --   ALT 12  --   --   --   --   ALKPHOS 34*  --   --   --   --   BILITOT 0.2*  --   --   --   --   PROT 5.9*  --   --   --   --   ALBUMIN 2.8*  < > 2.4* 2.4* 2.2*  < > = values in this interval not displayed. Recent Labs Lab 01/02/13 0950  01/03/13 1442 01/04/13 0430 01/04/13 1408 01/05/13 0555  WBC 5.6  < > 6.1 5.4 5.7 5.7  NEUTROABS 3.9  --   --   --   --   --   HGB 3.8*  < > 7.4* 6.5* 7.5* 7.2*   HCT 11.4*  < > 21.4* 19.4* 22.2* 21.4*  MCV 89.1  < > 84.9 85.8 86.0 87.0  PLT 215  < > 194 201 173 168  < > = values in this interval not displayed.  Recent Labs Lab 01/02/13 1430 01/02/13 2102 01/03/13 0550  TROPONINI <0.30 <0.30 <0.30   Studies/Results: No results found. Medications:   . calcium acetate  2,001 mg Oral TID WC  . darbepoetin (ARANESP) injection - DIALYSIS  200 mcg Intravenous Q Tue-HD  . doxercalciferol  4 mcg Intravenous Q T,Th,Sa-HD  . feeding supplement  1 Container Oral BID BM  . folic acid  1 mg Oral Daily  . hydrALAZINE  100 mg Oral BID  .  levETIRAcetam  1,000 mg Oral BID  . multivitamin with minerals  1 tablet Oral Daily  . pantoprazole  40 mg Oral BID  . sodium chloride  3 mL Intravenous Q12H   Dialysis Orders: TTS @ [facility]  4 hrs 60.5 kg 2K/2.25Ca 400/A1.5 Heparin 1000 U AVF @ LUA  Hectorol 4 mcg Epogen 18,000 U Venofer 0  Assessment/Plan:  1. Anemia - Hgb 3.8 in ED on admission; has rec'd a total of 5 units PRBC's.  Hb still only 7.2 this AM.   Previous extensive GI workup with small hiatal hernia per EGD & adenomatous sigmoid polyp per colonoscopy in 08/2010, gastritis per EGD 2011; capsule endoscopy declined in 10/2012; likely multifactorial; on outpatient Epogen 18,000 U, but misses HD; last T-sat 23% (8/20), but ferritin 1410.  Continue Aranesp 200/week; GI has signed off with no plan for further gi evaluation at this time because they wanted heme to see for BM.  (deferring capsule endo "until later" ) Pt has had bone marrow done today. Results pending. 2. ESRD - HD on TTS @ [facility] 3. Hypertension/volume - BP 170/75 on outpatient Hydralazine 100 mg bid; CXR negative for edema. 4. Metabolic bone disease - Last  iPTH 91; Hectorol 4 mcg, Phoslo 3 with meals. 5. Nutrition -Alb 2.4  Added nepro; continue renal diet, vitamin. 6. 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 Quantiferon reordered for this  admit - if positive would need to get ID input re treatment 7. Hepatitis C 8. DM Type 2 9. Hx seizure disorder 10. Hx cocaine abuse    [name] Bal, MD [facility] [phone] pager 01/05/2013, 12:18 PM

## 2013-01-05 NOTE — Procedures (Signed)
BM aspirate

## 2013-01-05 NOTE — Progress Notes (Addendum)
Subjective: Patient seen at bedside. [name] resting comfortably. He says he feels well this morning. He denies chest pain, SOB, abdominal pain, fatigue and lightheadedness. Denies melena, BRBPR, hematuria, hemoptysis. Plan is for CT guided bone marrow biopsy today. He does not like being NPO, but I emphasized the importance of the procedure and he understands.  Objective: Vital signs in last 24 hours: Filed Vitals:   01/04/13 1754 01/04/13 2109 01/05/13 0300 01/05/13 0601  BP: 192/74 179/74  140/65  Pulse: 87 80  77  Temp: 98.2 F (36.8 C) 99.7 F (37.6 C)  98.9 F (37.2 C)  TempSrc: Oral Oral  Oral  Resp: 22 20  20   Height:      Weight: 135 lb 5.8 oz (61.4 kg)  137 lb 9.1 oz (62.4 kg)   SpO2: 96% 99%  95%   Weight change: -3 lb 12 oz (-1.7 kg)  Intake/Output Summary (Last 24 hours) at 01/05/13 0941 Last data filed at 01/05/13 1191  Gross per 24 hour  Intake    240 ml  Output    681 ml  Net   -441 ml    Physical Exam  Constitutional: [name] oriented to person, place, and time and well-developed, well-nourished, and in no distress. HENT:  Head: Normocephalic and atraumatic.  Eyes: EOM are normal. Pupils are equal, round, and reactive to light.  Neck: Normal range of motion. Neck supple.  Cardiovascular: Normal rate, regular rhythm, normal heart sounds and intact distal pulses. Exam reveals no gallop and no friction rub.  No murmur heard.  Pulmonary/Chest: Effort normal and breath sounds normal. No respiratory distress. He has no wheezes. He has no rales. He exhibits no tenderness. Abdominal: Soft. Bowel sounds are normal. He exhibits no distension and no mass. There is no tenderness. There is no rebound and no guarding.  Musculoskeletal: Normal range of motion. He exhibits no edema and no tenderness.  Thin extremities.  Neurological: [name] alert and oriented to person, place, and time. No cranial nerve deficit. GCS score is 15.  Skin: Skin is warm and dry. No rash noted.    Psychiatric: Mood and affect normal.    Lab Results: Basic Metabolic Panel:  Recent Labs Lab 01/04/13 0430 01/05/13 0555  NA 136 137  K 4.2 3.9  CL 97 98  CO2 28 30  GLUCOSE 119* 95  BUN 74* 49*  CREATININE 7.28* 4.84*  CALCIUM 7.2* 7.4*  PHOS 6.4* 3.4   Liver Function Tests:  Recent Labs Lab 01/02/13 0950  01/04/13 0430 01/05/13 0555  AST 19  --   --   --   ALT 12  --   --   --   ALKPHOS 34*  --   --   --   BILITOT 0.2*  --   --   --   PROT 5.9*  --   --   --   ALBUMIN 2.8*  < > 2.4* 2.2*  < > = values in this interval not displayed. CBC:  Recent Labs Lab 01/02/13 0950  01/04/13 1408 01/05/13 0555  WBC 5.6  < > 5.7 5.7  NEUTROABS 3.9  --   --   --   HGB 3.8*  < > 7.5* 7.2*  HCT 11.4*  < > 22.2* 21.4*  MCV 89.1  < > 86.0 87.0  PLT 215  < > 173 168  < > = values in this interval not displayed. Cardiac Enzymes:  Recent Labs Lab 01/02/13 1430 01/02/13 2102  01/03/13 0550  TROPONINI <0.30 <0.30 <0.30   Coagulation:  Recent Labs Lab 01/02/13 0950  LABPROT 13.0  INR 1.00   Quantiferon, FOBT still pending.  Micro Results: Recent Results (from the past 240 hour(s))  MRSA PCR SCREENING     Status: None   Collection Time    01/02/13  2:56 PM      Result Value Range Status   MRSA by PCR NEGATIVE  NEGATIVE Final   Comment:            The GeneXpert MRSA Assay (FDA     approved for NASAL specimens     only), is one component of a     comprehensive MRSA colonization     surveillance program. It is not     intended to diagnose MRSA     infection nor to guide or     monitor treatment for     MRSA infections.   Studies/Results: No results found. Medications: I have reviewed the patient's current medications. Scheduled Meds: . calcium acetate  2,001 mg Oral TID WC  . darbepoetin (ARANESP) injection - DIALYSIS  200 mcg Intravenous Q Tue-HD  . doxercalciferol  4 mcg Intravenous Q T,Th,Sa-HD  . feeding supplement  1 Container Oral BID BM  .  folic acid  1 mg Oral Daily  . hydrALAZINE  100 mg Oral BID  . levETIRAcetam  1,000 mg Oral BID  . multivitamin with minerals  1 tablet Oral Daily  . pantoprazole  40 mg Oral BID  . sodium chloride  3 mL Intravenous Q12H   Continuous Infusions:  PRN Meds:.acetaminophen Assessment/Plan: Demaryius Imran is a 57 y.o. male with PMH ESRD on dialysis (TTS), HTN, anemia of chronic disease, cocaine abuse, tobacco and cocaine abuse who presents from dialysis after lab work showed a hemoglobin of 3.9.  #Acute on chronic normocytic anemia - Hgb 3.8>4.8 s/p 1U PRBC>7.1 s/p +2 PRBC>6.5>7.4 s/p +1U PRBC>6.5>7.5 s/p +1U PRBC>7.2 this morning. His Hgb appears to have finally stabilized. Our transfusion goal is Hgb>7. During prior admissions his chronic anemia was thought to be multifactorial from chronic disease, chronic renal insufficiency, iron deficiency, and occult gastrointestinal blood loss. He denies gross blood loss, dark or tarry stools. He finally agreed to bone marrow biopsy after counseling and reassurance yesterday. - Appreciate radiology recs, plan is for CT guided bone marrow biopsy today - Appreciate GI recs, will consider outpatient capsule study - Protonix 40mg  BID po  - Folate 1mg  and MV daily  - Follow up am CBC  #ESRD (end stage renal disease) on dialysis - On TTS schedule. Patient has been noncompliant with his dialysis as an outpatient, only attending 1 session in the past 2 weeks prior to admission. K is wnl. CXR negative for pulmonary edema. - Appreciate renal recs - Aranesp (darbepoetin alfa) with dialysis, 200/week - Hectorol 4 mcg with dialysis, Phoslo TID with meals  - Nepro feeding supplement between meals 2/2 low albumin - Renal diet  - Follow up am renal function panel   #?Tuberculosis exposure - Per renal he was reported to have had a positive quantiferon test in [place], [place], recently. Apparently he had a bronchoscopy with negative AFB smears in late August. - Follow up  Quantiferon tb gold assay, currently in process, will call ID if positive   #Diabetes mellitus - Last A1C 5.0 on 11/07/12. Not on medications or insulin. - Will follow BMPs and add CBG monitoring if needed   #Hypertension - BP currently stable at  140/65. - Continue home hydralazine  #Seizure disorder - Stable. No seizure activity.  - Continue home keppra   #Cocaine abuse - Patient reports he last abused crack cocaine 3 weeks ago.  - UDS cancelled as patient did not make a urine sample in 2 days  #DVT PPX - SCDs  Dispo: Disposition is deferred at this time, awaiting improvement of current medical problems.  Anticipated discharge in approximately 1-3 day(s).   The patient does have a current PCP Dyke Maes, MD) and does need an [facility] hospital follow-up appointment after discharge.  The patient does not have transportation limitations that hinder transportation to clinic appointments.  .Services Needed at time of discharge: Y = Yes, Blank = No PT:   OT:   RN:   Equipment:   Other:     LOS: 3 days   Vivi Barrack, MD 01/05/2013, 9:41 AM

## 2013-01-05 NOTE — Progress Notes (Signed)
  Date: 01/05/2013  Patient name: Taurean Ju  Medical record number: [id number]  Date of birth: Jul 08, 1955   This patient has been seen and the plan of care was discussed with the house staff. Please see their note for complete details. I concur with their findings with the following additions/corrections: He feels well this morning. No CP, no SOB, no dizziness.  Pending CT BM bx today.  Etiology of his acute on chronic anemia is uncertain, perhaps BM will give [place] a better answer.  Will need to continue Aranesp.  Regarding hx of positive QuantTB Gold, repeating this.  He has no s/s of active TB infection.    Jonah Blue, DO, FACP Faculty [facility] 01/05/2013, 11:23 AM

## 2013-01-06 LAB — CBC
HCT: 27.6 % — ABNORMAL LOW (ref 39.0–52.0)
Hemoglobin: 9.4 g/dL — ABNORMAL LOW (ref 13.0–17.0)
MCH: 29.7 pg (ref 26.0–34.0)
MCH: 29.7 pg (ref 26.0–34.0)
MCHC: 34.1 g/dL (ref 30.0–36.0)
MCV: 87.1 fL (ref 78.0–100.0)
Platelets: 160 10*3/uL (ref 150–400)
Platelets: 165 K/uL (ref 150–400)
RBC: 2.19 MIL/uL — ABNORMAL LOW (ref 4.22–5.81)
RBC: 3.17 MIL/uL — ABNORMAL LOW (ref 4.22–5.81)
RDW: 15.5 % (ref 11.5–15.5)
RDW: 15 % (ref 11.5–15.5)
WBC: 5.4 K/uL (ref 4.0–10.5)

## 2013-01-06 LAB — TYPE AND SCREEN
Unit division: 0
Unit division: 0
Unit division: 0
Unit division: 0

## 2013-01-06 LAB — FERRITIN: Ferritin: 108 ng/mL (ref 22–322)

## 2013-01-06 LAB — IRON AND TIBC
Iron: 32 ug/dL — ABNORMAL LOW (ref 42–135)
TIBC: 220 ug/dL (ref 215–435)
UIBC: 188 ug/dL (ref 125–400)

## 2013-01-06 LAB — RENAL FUNCTION PANEL
Albumin: 2.3 g/dL — ABNORMAL LOW (ref 3.5–5.2)
CO2: 28 mEq/L (ref 19–32)
Calcium: 7.8 mg/dL — ABNORMAL LOW (ref 8.4–10.5)
Creatinine, Ser: 7.14 mg/dL — ABNORMAL HIGH (ref 0.50–1.35)
GFR calc Af Amer: 9 mL/min — ABNORMAL LOW (ref >90)
GFR calc non Af Amer: 8 mL/min — ABNORMAL LOW (ref >90)
Phosphorus: 4.5 mg/dL (ref 2.3–4.6)
Sodium: 130 mEq/L — ABNORMAL LOW (ref 135–145)

## 2013-01-06 LAB — PREPARE RBC (CROSSMATCH)

## 2013-01-06 MED ORDER — DOXERCALCIFEROL 4 MCG/2ML IV SOLN
INTRAVENOUS | Status: AC
  Administered 2013-01-06: 4 ug via INTRAVENOUS
  Filled 2013-01-06: qty 2

## 2013-01-06 NOTE — Evaluation (Signed)
Physical Therapy Evaluation Patient Details Name: [name] MRN: [id number] DOB: [birth year] Today's Date: 01/06/2013 Time: 9562-1308 PT Time Calculation (min): 12 min  PT Assessment / Plan / Recommendation History of Present Illness  [name] is a 57 y.o. male with PMH ESRD on dialysis (TTS), HTN, anemia of chronic disease, cocaine abuse, tobacco abuse who presents from dialysis after lab work showed a hemoglobin of 3.9.  Has received several transfusions, with Hgb up to 7.2.  Clinical Impression  Patient presents with problems listed below.  Will benefit from acute PT to maximize independence prior to return home with periods of time alone.    PT Assessment  Patient needs continued PT services    Follow Up Recommendations  No PT follow up;Supervision - Intermittent    Does the patient have the potential to tolerate intense rehabilitation      Barriers to Discharge Decreased caregiver support Roommate works    Equipment Recommendations  None recommended by PT    Recommendations for Other Services     Frequency Min 3X/week    Precautions / Restrictions Precautions Precautions: Fall Restrictions Weight Bearing Restrictions: No   Pertinent Vitals/Pain Pain impacting functional mobility.      Mobility  Bed Mobility Bed Mobility: Supine to Sit;Sitting - Scoot to Edge of Bed;Sit to Supine Supine to Sit: 5: Supervision Sitting - Scoot to Edge of Bed: 5: Supervision Sit to Supine: 5: Supervision Details for Bed Mobility Assistance: Supervision for safety only. Transfers Transfers: Sit to Stand;Stand to Sit Sit to Stand: 4: Min guard;From bed Stand to Sit: 4: Min guard;To bed Details for Transfer Assistance: No physical assist needed.  Assist for safety/balance only. Ambulation/Gait Ambulation/Gait Assistance: 4: Min guard Ambulation Distance (Feet): 68 Feet Assistive device: None Ambulation/Gait Assistance Details: Patient with good gait pattern, balance, and  gait speed. Gait Pattern: Within Functional Limits    Exercises     PT Diagnosis: Difficulty walking;Abnormality of gait;Generalized weakness;Acute pain  PT Problem List: Decreased strength;Decreased activity tolerance;Decreased balance;Decreased mobility;Decreased knowledge of use of DME;Impaired sensation;Pain PT Treatment Interventions: DME instruction;Gait training;Stair training;Functional mobility training;Therapeutic exercise;Patient/family education     PT Goals(Current goals can be found in the care plan section) Acute Rehab PT Goals Patient Stated Goal: To feel better PT Goal Formulation: With patient Time For Goal Achievement: 01/13/13 Potential to Achieve Goals: Good  Visit Information  Last PT Received On: 01/06/13 Assistance Needed: +1 History of Present Illness: [name] is a 57 y.o. male with PMH ESRD on dialysis (TTS), HTN, anemia of chronic disease, cocaine abuse, tobacco abuse who presents from dialysis after lab work showed a hemoglobin of 3.9.  Has received several transfusions, with Hgb up to 7.2.       Prior Functioning  Home Living Family/patient expects to be discharged to:: Private residence Living Arrangements: Non-relatives/Friends Available Help at Discharge: Friend(s);Available PRN/intermittently Type of Home: Apartment Home Access: Stairs to enter Entrance Stairs-Number of Steps: several Entrance Stairs-Rails: Right;Left Home Layout: One level Home Equipment: Shower seat Prior Function Level of Independence: Independent Communication Communication: No difficulties    Cognition  Cognition Arousal/Alertness: Awake/alert Behavior During Therapy: WFL for tasks assessed/performed Overall Cognitive Status: Within Functional Limits for tasks assessed    Extremity/Trunk Assessment Upper Extremity Assessment Upper Extremity Assessment: Overall WFL for tasks assessed Lower Extremity Assessment Lower Extremity Assessment: Generalized weakness  (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  End of Session PT - End of Session Activity Tolerance: Patient limited by fatigue;Patient limited by pain (minimal participation today) Patient left: in bed;with call bell/phone within reach  GP     Vena Austria 01/06/2013, 5:35 PM Durenda Hurt. Renaldo Fiddler, [facility] Acute Rehab Services Pager [phone]

## 2013-01-06 NOTE — Procedures (Signed)
I have personally attended this patient's dialysis session.  No heparin.  UF goal 3000. Hb back down to 6.5  Giving 2 units PRBC's.  Getting 200 Aranesp/week and I am rechecking iron studies today.    [name] B

## 2013-01-06 NOTE — Progress Notes (Signed)
Subjective: Patient seen at bedside in dialysis. [name] resting comfortably. He feels well this morning. He denies chest pain, SOB, abdominal pain, fatigue and lightheadedness. Denies melena, BRBPR, hematuria, hemoptysis. He tolerated yesterday's bone marrow biopsy well with only some mild residual  hip soreness.  Objective: Vital signs in last 24 hours: Filed Vitals:   01/06/13 0522 01/06/13 0728 01/06/13 0747 01/06/13 0800  BP: 172/67 142/72 144/72 136/65  Pulse: 75 76 72 73  Temp: 98.5 F (36.9 C) 97.5 F (36.4 C)    TempSrc: Oral Oral    Resp: 18 18 18 18   Height:      Weight: 138 lb 3.7 oz (62.7 kg) 138 lb 0.1 oz (62.6 kg)    SpO2: 97% 97%     Weight change: 5 lb 4.7 oz (2.4 kg)  Intake/Output Summary (Last 24 hours) at 01/06/13 0981 Last data filed at 01/05/13 2100  Gross per 24 hour  Intake    120 ml  Output      0 ml  Net    120 ml    Physical Exam  Constitutional: [name] oriented to person, place, and time and well-developed, well-nourished, and in no distress. HENT:  Head: Normocephalic and atraumatic.  Eyes: EOM are normal. Pupils are equal, round, and reactive to light.  Neck: Normal range of motion. Neck supple.  Cardiovascular: Normal rate, regular rhythm, normal heart sounds and intact distal pulses. Exam reveals no gallop and no friction rub.  No murmur heard.  Pulmonary/Chest: Effort normal and breath sounds normal. No respiratory distress. He has no wheezes. He has no rales. He exhibits no tenderness. Abdominal: Soft. Bowel sounds are normal. He exhibits no distension and no mass. There is no tenderness. There is no rebound and no guarding.  Musculoskeletal: Normal range of motion. He exhibits no edema and no tenderness.  Thin extremities.  Neurological: [name] alert and oriented to person, place, and time. No cranial nerve deficit. GCS score is 15.  Skin: Skin is warm and dry. No rash noted.  Psychiatric: Mood and affect normal.    Lab Results: Basic  Metabolic Panel:  Recent Labs Lab 01/04/13 0430 01/05/13 0555  NA 136 137  K 4.2 3.9  CL 97 98  CO2 28 30  GLUCOSE 119* 95  BUN 74* 49*  CREATININE 7.28* 4.84*  CALCIUM 7.2* 7.4*  PHOS 6.4* 3.4   Liver Function Tests:  Recent Labs Lab 01/02/13 0950  01/04/13 0430 01/05/13 0555  AST 19  --   --   --   ALT 12  --   --   --   ALKPHOS 34*  --   --   --   BILITOT 0.2*  --   --   --   PROT 5.9*  --   --   --   ALBUMIN 2.8*  < > 2.4* 2.2*  < > = values in this interval not displayed. CBC:  Recent Labs Lab 01/02/13 0950  01/05/13 0555 01/06/13 0757  WBC 5.6  < > 5.7 6.4  NEUTROABS 3.9  --   --   --   HGB 3.8*  < > 7.2* 6.5*  HCT 11.4*  < > 21.4* 19.3*  MCV 89.1  < > 87.0 88.1  PLT 215  < > 168 160  < > = values in this interval not displayed. Cardiac Enzymes:  Recent Labs Lab 01/02/13 1430 01/02/13 2102 01/03/13 0550  TROPONINI <0.30 <0.30 <0.30   Coagulation:  Recent  Labs Lab 01/02/13 0950  LABPROT 13.0  INR 1.00    Micro Results: Recent Results (from the past 240 hour(s))  MRSA PCR SCREENING     Status: None   Collection Time    01/02/13  2:56 PM      Result Value Range Status   MRSA by PCR NEGATIVE  NEGATIVE Final   Comment:            The GeneXpert MRSA Assay (FDA     approved for NASAL specimens     only), is one component of a     comprehensive MRSA colonization     surveillance program. It is not     intended to diagnose MRSA     infection nor to guide or     monitor treatment for     MRSA infections.   Studies/Results: Ct Biopsy  01/05/2013   CLINICAL DATA:  Anemia  EXAM: CT-GUIDED bone marrow biopsy and core.  MEDICATIONS AND MEDICAL HISTORY: Versed 2.0 mg, Fentanyl 100 mcg.  Additional Medications: None.  ANESTHESIA/SEDATION: Moderate sedation time: 10 minutes  PROCEDURE: The procedure, risks, benefits, and alternatives were explained to the patient. Questions regarding the procedure were encouraged and answered. The patient  understands and consents to the procedure.  The back was prepped with Betadine in a sterile fashion, and a sterile drape was applied covering the operative field. A sterile gown and sterile gloves were used for the procedure.  Under CT guidance, a(n) 11 gauge guide needle was advanced into the right iliac bone via posterior approach. Aspirates were obtained. A core was obtained. Final imaging was performed.  Patient tolerated the procedure well without complication. Vital sign monitoring by nursing staff during the procedure will continue as patient is in the special procedures unit for post procedure observation.  FINDINGS: The images document guide needle placement within the right iliac bone via posterior approach. Post biopsy images demonstrate no hemorrhage.  IMPRESSION: Successful CT-guided bone marrow aspirate and core.   Electronically Signed   By: Maryclare Bean M.D.   On: 01/05/2013 15:28   Medications: I have reviewed the patient's current medications. Scheduled Meds: . calcium acetate  2,001 mg Oral TID WC  . darbepoetin (ARANESP) injection - DIALYSIS  200 mcg Intravenous Q Tue-HD  . doxercalciferol  4 mcg Intravenous Q T,Th,Sa-HD  . feeding supplement  1 Container Oral BID BM  . folic acid  1 mg Oral Daily  . hydrALAZINE  100 mg Oral BID  . levETIRAcetam  1,000 mg Oral BID  . multivitamin with minerals  1 tablet Oral Daily  . pantoprazole  40 mg Oral BID  . sodium chloride  3 mL Intravenous Q12H   Continuous Infusions:  PRN Meds:.acetaminophen Assessment/Plan: [name] is a 57 y.o. male with PMH ESRD on dialysis (TTS), HTN, anemia of chronic disease, cocaine abuse, tobacco and cocaine abuse who presents from dialysis after lab work showed a hemoglobin of 3.9.  #Acute on chronic normocytic anemia - Hgb 3.8>4.8 s/p 1U PRBC>7.1 s/p +2 PRBC>6.5>7.4 s/p +1U PRBC>6.5>7.5 s/p +1U PRBC>7.2>6.5 this morning. Our transfusion goal is Hgb>7. He was given another 2 U PRBC during dialysis this  morning. He denies gross blood loss, dark or tarry stools. During prior admissions his chronic anemia was thought to be multifactorial from chronic disease, chronic renal insufficiency, iron deficiency, and occult gastrointestinal blood loss. A CT-guided bone marrow biopsy was performed yesterday. Heme/onc is aware and will formally see him after the results are back. -  Will consider calling GI again if Hgb continues to drop - Protonix 40mg  BID po  - Folate 1mg  and MV daily  - Follow up post-transfusion CBC, repeat iron studies  #ESRD (end stage renal disease) on dialysis - On TTS schedule. Got dialysis today. Patient has been noncompliant with his dialysis as an outpatient, only attending 1 session in the past 2 weeks prior to admission. K is wnl. CXR negative for pulmonary edema. - Appreciate renal recs - Aranesp (darbepoetin alfa) with dialysis, 200/week - Hectorol 4 mcg with dialysis, Phoslo TID with meals  - Nepro feeding supplement between meals 2/2 low albumin - Renal diet  - AM renal function panel   #?Tuberculosis exposure - Per renal he was reported to have had a positive quantiferon test in [place], [place], recently. Apparently he had a bronchoscopy with negative AFB smears in late August. - Follow up Quantiferon tb gold assay, had to be re-ordered yesterday as it was apparently collected in the wrong tube, will call ID if positive   #Diabetes mellitus - Last A1C 5.0 on 11/07/12. Not on medications or insulin. - Will follow BMPs and add CBG monitoring if needed   #Hypertension - BP currently stable at 139/69. - Continue home hydralazine  #Seizure disorder - Stable. No seizure activity.  - Continue home keppra   #Cocaine abuse - Patient reports he last abused crack cocaine 3 weeks ago.  - UDS cancelled as patient did not make a urine sample in 2 days  #DVT PPX - SCDs  Dispo: Disposition is deferred at this time, awaiting improvement of current medical problems.  Anticipated  discharge in approximately 1-3 day(s).   The patient does have a current PCP Dyke Maes, MD) and does need an [facility] hospital follow-up appointment after discharge.  The patient does not have transportation limitations that hinder transportation to clinic appointments.  .Services Needed at time of discharge: Y = Yes, Blank = No PT:   OT:   RN:   Equipment:   Other:     LOS: 4 days   Vivi Barrack, MD 01/06/2013, 8:19 AM

## 2013-01-06 NOTE — Progress Notes (Signed)
Subjective: Had bone marrow yesterday In dialysis at present - only complaint is sore back from BM Has had 5 units of PRBC's - Hb down again today to less than 7 Giving 2 units on HD  Objective Vital signs in last 24 hours: Filed Vitals:   01/06/13 0728 01/06/13 0747 01/06/13 0800 01/06/13 0830  BP: 142/72 144/72 136/65 151/72  Pulse: 76 72 73 74  Temp: 97.5 F (36.4 C)     TempSrc: Oral     Resp: 18 18 18 18   Height:      Weight: 62.6 kg (138 lb 0.1 oz)     SpO2: 97%      Weight change: 2.4 kg (5 lb 4.7 oz)  Intake/Output Summary (Last 24 hours) at 01/06/13 0841 Last data filed at 01/05/13 2100  Gross per 24 hour  Intake    120 ml  Output      0 ml  Net    120 ml   Physical Exam:  BP 151/72  Pulse 74  Temp(Src) 97.5 F (36.4 C) (Oral)  Resp 18  Ht 5\' 9"  (1.753 m)  Wt 62.6 kg (138 lb 0.1 oz)  BMI 20.37 kg/m2  SpO2 97% Lungs clear Cardiac regular S1S2 no S2 2/6 murmur USB no rub Abd soft and non tender Extremities no edema Left AVF with bruit - currently accessed for HD.   Labs: Basic Metabolic Panel:  Recent Labs Lab 01/02/13 0950 01/02/13 1430 01/03/13 0550 01/04/13 0430 01/05/13 0555  NA 138 137 136 136 137  K 5.1 4.9 4.2 4.2 3.9  CL 98 97 99 97 98  CO2 24 23 27 28 30   GLUCOSE 72 150* 94 119* 95  BUN 110* 114* 48* 74* 49*  CREATININE 10.43* 11.00* 5.07* 7.28* 4.84*  CALCIUM 7.5* 7.4* 7.0* 7.2* 7.4*  PHOS  --  6.4* 4.5 6.4* 3.4     Recent Labs Lab 01/02/13 0950  01/03/13 0550 01/04/13 0430 01/05/13 0555  AST 19  --   --   --   --   ALT 12  --   --   --   --   ALKPHOS 34*  --   --   --   --   BILITOT 0.2*  --   --   --   --   PROT 5.9*  --   --   --   --   ALBUMIN 2.8*  < > 2.4* 2.4* 2.2*  < > = values in this interval not displayed. Recent Labs Lab 01/02/13 0950  01/04/13 0430 01/04/13 1408 01/05/13 0555 01/06/13 0757  WBC 5.6  < > 5.4 5.7 5.7 6.4  NEUTROABS 3.9  --   --   --   --   --   HGB 3.8*  < > 6.5* 7.5* 7.2* 6.5*  HCT  11.4*  < > 19.4* 22.2* 21.4* 19.3*  MCV 89.1  < > 85.8 86.0 87.0 88.1  PLT 215  < > 201 173 168 160  < > = values in this interval not displayed.  Recent Labs Lab 01/02/13 1430 01/02/13 2102 01/03/13 0550  TROPONINI <0.30 <0.30 <0.30  Quantiferon TB gold assay in process  Studies/Results: Ct Biopsy  01/05/2013   CLINICAL DATA:  Anemia  EXAM: CT-GUIDED bone marrow biopsy and core.  MEDICATIONS AND MEDICAL HISTORY: Versed 2.0 mg, Fentanyl 100 mcg.  Additional Medications: None.  ANESTHESIA/SEDATION: Moderate sedation time: 10 minutes  PROCEDURE: The procedure, risks, benefits, and alternatives were explained to the  patient. Questions regarding the procedure were encouraged and answered. The patient understands and consents to the procedure.  The back was prepped with Betadine in a sterile fashion, and a sterile drape was applied covering the operative field. A sterile gown and sterile gloves were used for the procedure.  Under CT guidance, a(n) 11 gauge guide needle was advanced into the right iliac bone via posterior approach. Aspirates were obtained. A core was obtained. Final imaging was performed.  Patient tolerated the procedure well without complication. Vital sign monitoring by nursing staff during the procedure will continue as patient is in the special procedures unit for post procedure observation.  FINDINGS: The images document guide needle placement within the right iliac bone via posterior approach. Post biopsy images demonstrate no hemorrhage.  IMPRESSION: Successful CT-guided bone marrow aspirate and core.   Electronically Signed   By: Maryclare Bean M.D.   On: 01/05/2013 15:28   Medications:   . calcium acetate  2,001 mg Oral TID WC  . darbepoetin (ARANESP) injection - DIALYSIS  200 mcg Intravenous Q Tue-HD  . doxercalciferol  4 mcg Intravenous Q T,Th,Sa-HD  . feeding supplement  1 Container Oral BID BM  . folic acid  1 mg Oral Daily  . hydrALAZINE  100 mg Oral BID  . levETIRAcetam   1,000 mg Oral BID  . multivitamin with minerals  1 tablet Oral Daily  . pantoprazole  40 mg Oral BID  . sodium chloride  3 mL Intravenous Q12H   Dialysis Orders: TTS @ AF  4 hrs 60.5 kg 2K/2.25Ca 400/A1.5 Heparin 1000 U AVF @ LUA  Hectorol 4 mcg Epogen 18,000 U Venofer 0  Assessment/Plan:  1. Anemia - Hgb 3.8 in ED on admission; has rec'd a total of 5 units PRBC's.  Hb under 7 again. Will give 2 more units with HD  Previous extensive GI workup with small hiatal hernia per EGD & adenomatous sigmoid polyp per colonoscopy in 08/2010, gastritis per EGD 2011; capsule endoscopy declined in 10/2012; likely multifactorial; on outpatient Epogen 18,000 U, but misses HD; last T-sat 23% (8/20), but ferritin 1410.  Continue Aranesp 200/week; GI has signed off with no plan for further gi evaluation at this time because they wanted heme to see for BM.  (deferring capsule endo "until later" ) Pt has had bone marrow done today. Results pending. Would there be any benefit to CT to look for some occult bleeding source????  After 5 units of blood would not expect Hb to be 6.5 in absence of overt gi loss, etc 2. ESRD - HD on TTS @ AF HD (no heparin with dialysis now) 3. Hypertension/volume -  Hydralazine 100 mg bid/vol control; CXR negative for edema. 4. Metabolic bone disease - Last  iPTH 91; Hectorol 4 mcg, Phoslo 3 with meals. 5. Nutrition -Alb 2.4  Added nepro; continue renal diet, vitamin. 6. 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 Quantiferon reordered for this admit - if positive would need to get ID input re treatment 7. Hepatitis C 8. DM Type 2 9. Hx seizure disorder 10. Hx cocaine abuse    Camille Bal, MD [facility] [phone] pager 01/06/2013, 8:41 AM

## 2013-01-06 NOTE — Progress Notes (Signed)
Pt received two units of blood; tolerated transfusion well; no s/s of a rxn noted, denies SOB, pain or itching.

## 2013-01-07 LAB — CBC
Hemoglobin: 8.2 g/dL — ABNORMAL LOW (ref 13.0–17.0)
MCV: 88.7 fL (ref 78.0–100.0)
Platelets: 147 10*3/uL — ABNORMAL LOW (ref 150–400)
RBC: 2.75 MIL/uL — ABNORMAL LOW (ref 4.22–5.81)
RDW: 15.6 % — ABNORMAL HIGH (ref 11.5–15.5)
WBC: 5 10*3/uL (ref 4.0–10.5)

## 2013-01-07 NOTE — Progress Notes (Signed)
Subjective:  No cos, tolerated hd last night Objective Vital signs in last 24 hours: Filed Vitals:   01/06/13 1152 01/06/13 1340 01/06/13 2125 01/07/13 0500  BP: 159/79 145/71 165/66 158/69  Pulse: 70 72 78 76  Temp: 98.6 F (37 C) 98.3 F (36.8 C) 99.2 F (37.3 C) 99.5 F (37.5 C)  TempSrc: Oral Oral Oral Oral  Resp: 16 18 18 18   Height:      Weight:    66.407 kg (146 lb 6.4 oz)  SpO2:  99% 98% 98%   Weight change: -0.1 kg (-3.5 oz)  Intake/Output Summary (Last 24 hours) at 01/07/13 0920 Last data filed at 01/06/13 2134  Gross per 24 hour  Intake   1053 ml  Output   2250 ml  Net  -1197 ml    Physical Exam: General: awoken from sleep, nad, appropriate Heart: RRR, 2/6 sem , no rub or gallop Lungs: CTA bilaterally Abdomen: BS pos. , soft, nontender Extremities: Dialysis Access: NO pedal edama/ Pos. Bruit LU A AVF   Labs: Basic Metabolic Panel:  Recent Labs Lab 01/04/13 0430 01/05/13 0555 01/06/13 0757  NA 136 137 130*  K 4.2 3.9 4.6  CL 97 98 92*  CO2 28 30 28   GLUCOSE 119* 95 90  BUN 74* 49* 79*  CREATININE 7.28* 4.84* 7.14*  CALCIUM 7.2* 7.4* 7.8*  PHOS 6.4* 3.4 4.5   Liver Function Tests:  Recent Labs Lab 01/02/13 0950  01/04/13 0430 01/05/13 0555 01/06/13 0757  AST 19  --   --   --   --   ALT 12  --   --   --   --   ALKPHOS 34*  --   --   --   --   BILITOT 0.2*  --   --   --   --   PROT 5.9*  --   --   --   --   ALBUMIN 2.8*  < > 2.4* 2.2* 2.3*  < > = values in this interval not displayed. No results found for this basename: LIPASE, AMYLASE,  in the last 168 hours No results found for this basename: AMMONIA,  in the last 168 hours CBC:  Recent Labs Lab 01/02/13 0950  01/04/13 1408 01/05/13 0555 01/06/13 0757 01/06/13 1445 01/07/13 0544  WBC 5.6  < > 5.7 5.7 6.4 5.4 5.0  NEUTROABS 3.9  --   --   --   --   --   --   HGB 3.8*  < > 7.5* 7.2* 6.5* 9.4* 8.2*  HCT 11.4*  < > 22.2* 21.4* 19.3* 27.6* 24.4*  MCV 89.1  < > 86.0 87.0 88.1 87.1  88.7  PLT 215  < > 173 168 160 165 147*  < > = values in this interval not displayed. Cardiac Enzymes:  Recent Labs Lab 01/02/13 1430 01/02/13 2102 01/03/13 0550  TROPONINI <0.30 <0.30 <0.30   CBG:  Recent Labs Lab 01/03/13 1155 01/03/13 1723 01/03/13 2011  GLUCAP 90 88 112*    Iron Studies:  Recent Labs  01/06/13 0855  IRON 32*  TIBC 220  FERRITIN 108   Studies/Results: Ct Biopsy  01/05/2013   CLINICAL DATA:  Anemia  EXAM: CT-GUIDED bone marrow biopsy and core.  MEDICATIONS AND MEDICAL HISTORY: Versed 2.0 mg, Fentanyl 100 mcg.  Additional Medications: None.  ANESTHESIA/SEDATION: Moderate sedation time: 10 minutes  PROCEDURE: The procedure, risks, benefits, and alternatives were explained to the patient. Questions regarding the procedure were encouraged and  answered. The patient understands and consents to the procedure.  The back was prepped with Betadine in a sterile fashion, and a sterile drape was applied covering the operative field. A sterile gown and sterile gloves were used for the procedure.  Under CT guidance, a(n) 11 gauge guide needle was advanced into the right iliac bone via posterior approach. Aspirates were obtained. A core was obtained. Final imaging was performed.  Patient tolerated the procedure well without complication. Vital sign monitoring by nursing staff during the procedure will continue as patient is in the special procedures unit for post procedure observation.  FINDINGS: The images document guide needle placement within the right iliac bone via posterior approach. Post biopsy images demonstrate no hemorrhage.  IMPRESSION: Successful CT-guided bone marrow aspirate and core.   Electronically Signed   By: Maryclare Bean M.D.   On: 01/05/2013 15:28   Medications:   . calcium acetate  2,001 mg Oral TID WC  . darbepoetin (ARANESP) injection - DIALYSIS  200 mcg Intravenous Q Tue-HD  . doxercalciferol  4 mcg Intravenous Q T,Th,Sa-HD  . feeding supplement  1  Container Oral BID BM  . folic acid  1 mg Oral Daily  . hydrALAZINE  100 mg Oral BID  . levETIRAcetam  1,000 mg Oral BID  . multivitamin with minerals  1 tablet Oral Daily  . pantoprazole  40 mg Oral BID  . sodium chloride  3 mL Intravenous Q12H   Dialysis Orders: TTS @ AF  4 hrs 60.5 kg 2K/2.25Ca 400/A1.5 Heparin 1000 U AVF @ LUA  Hectorol 4 mcg Epogen 18,000 U Venofer 0   Assessment/Plan:  1. Anemia - admit  Hgb 3.8 / has rec'd a total of 7 units PRBC's/ Yesterday 6.5   2units prbc's on hd yesterday ->9.4> 8.2 this am TSAT= 15% with 108 Ferritin yesterday/ prior 20% on 12/06/12  Will give Venofer Load on HD. Noted Admit team to reconsult GI and Heme to see when BM results Back.? On MAx Aranesp. Marland KitchenPrevious extensive GI workup with small hiatal hernia per EGD & adenomatous sigmoid polyp per colonoscopy in 08/2010, gastritis per EGD 2011; capsule endoscopy declined in 10/2012; likely multifactorial; on outpatient Epogen 18,000 U, but misses HD. Needing further eval with dropping HGB despite 7 units prbcs given. 2. ESRD - HD on TTS @ AF HD (no heparin with dialysis now) 3. Hypertension/volume - AM  wt 66.5 ??  ^.5 kg > edw but no sob/ 3500 cc uf  Hydralazine 100 mg bid/vol control; CXR negative for edema. 4. Metabolic bone disease - Last iPTH 91; Hectorol 4 mcg, Phoslo 3 with meals. 5. Nutrition -Alb 2.3/ yesterday Nepro added.; continue renal diet, vitamin. 6. 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 Quantiferon reordered for this admit - if positive would need to get ID input re treatment 7. Hepatitis C 8. DM Type 2 9. Hx seizure disorder 10. Hx cocaine abuse   Lenny Pastel, PA-C [facility] [phone] 01/07/2013,9:20 AM  LOS: 5 days  I have seen and examined this patient and agree with plan as outlined above. Has now rec'd total of 7 units PRBC's and Hb only 8.2.  No overt bleeding site. BM results pending.  GI wants to see BM result before proceeding with capsule endo.  Will continue with HD without heparin on TTS schedule. Cayleb Jarnigan B,MD 01/07/2013 10:03 AM

## 2013-01-07 NOTE — Progress Notes (Signed)
Physical Therapy Discharge Patient Details Name: [name] MRN: [id number] DOB: [birth year] Today's Date: 01/07/2013 Time: 5621-3086 PT Time Calculation (min): 11 min  Patient discharged from PT services secondary to goals met and no further PT needs identified.  Please see latest therapy progress note for current level of functioning and progress toward goals.    Progress and discharge plan discussed with patient and/or caregiver: Patient/Caregiver agrees with plan  GP     Vena Austria 01/07/2013, 4:58 PM

## 2013-01-07 NOTE — Progress Notes (Addendum)
Subjective: Patient seen at bedside. [name] awake, sitting by the window. He feels fine. He denies chest pain, SOB, abdominal pain, fatigue and lightheadedness. Denies melena, BRBPR, hematuria, hemoptysis. He still has some mild residual hip soreness from his bone marrow biopsy, but he says it is improving.  Objective: Vital signs in last 24 hours: Filed Vitals:   01/06/13 1152 01/06/13 1340 01/06/13 2125 01/07/13 0500  BP: 159/79 145/71 165/66 158/69  Pulse: 70 72 78 76  Temp: 98.6 F (37 C) 98.3 F (36.8 C) 99.2 F (37.3 C) 99.5 F (37.5 C)  TempSrc: Oral Oral Oral Oral  Resp: 16 18 18 18   Height:      Weight:    146 lb 6.4 oz (66.407 kg)  SpO2:  99% 98% 98%   Weight change: -3.5 oz (-0.1 kg)  Intake/Output Summary (Last 24 hours) at 01/07/13 0900 Last data filed at 01/06/13 2134  Gross per 24 hour  Intake   1053 ml  Output   2250 ml  Net  -1197 ml    Physical Exam  Constitutional: [name] oriented to person, place, and time and well-developed, well-nourished, and in no distress. HENT:  Head: Normocephalic and atraumatic.  Eyes: EOM are normal. Pupils are equal, round, and reactive to light.  Neck: Normal range of motion. Neck supple.  Cardiovascular: Normal rate, regular rhythm, normal heart sounds and intact distal pulses. Exam reveals no gallop and no friction rub.  No murmur heard.  Pulmonary/Chest: Effort normal and breath sounds normal. No respiratory distress. He has no wheezes. He has no rales. He exhibits no tenderness. Abdominal: Soft. Bowel sounds are normal. He exhibits no distension and no mass. There is no tenderness. There is no rebound and no guarding.  Musculoskeletal: Normal range of motion. He exhibits no edema and no tenderness.  Thin extremities.  Neurological: [name] alert and oriented to person, place, and time. No cranial nerve deficit. GCS score is 15.  Skin: Skin is warm and dry. No rash noted.  Psychiatric: Mood and affect normal.    Lab  Results: Basic Metabolic Panel:  Recent Labs Lab 01/05/13 0555 01/06/13 0757  NA 137 130*  K 3.9 4.6  CL 98 92*  CO2 30 28  GLUCOSE 95 90  BUN 49* 79*  CREATININE 4.84* 7.14*  CALCIUM 7.4* 7.8*  PHOS 3.4 4.5   Liver Function Tests:  Recent Labs Lab 01/02/13 0950  01/05/13 0555 01/06/13 0757  AST 19  --   --   --   ALT 12  --   --   --   ALKPHOS 34*  --   --   --   BILITOT 0.2*  --   --   --   PROT 5.9*  --   --   --   ALBUMIN 2.8*  < > 2.2* 2.3*  < > = values in this interval not displayed. CBC:  Recent Labs Lab 01/02/13 0950  01/06/13 1445 01/07/13 0544  WBC 5.6  < > 5.4 5.0  NEUTROABS 3.9  --   --   --   HGB 3.8*  < > 9.4* 8.2*  HCT 11.4*  < > 27.6* 24.4*  MCV 89.1  < > 87.1 88.7  PLT 215  < > 165 147*  < > = values in this interval not displayed. Cardiac Enzymes:  Recent Labs Lab 01/02/13 1430 01/02/13 2102 01/03/13 0550  TROPONINI <0.30 <0.30 <0.30   Coagulation:  Recent Labs Lab 01/02/13 0950  LABPROT 13.0  INR 1.00    Micro Results: Recent Results (from the past 240 hour(s))  MRSA PCR SCREENING     Status: None   Collection Time    01/02/13  2:56 PM      Result Value Range Status   MRSA by PCR NEGATIVE  NEGATIVE Final   Comment:            The GeneXpert MRSA Assay (FDA     approved for NASAL specimens     only), is one component of a     comprehensive MRSA colonization     surveillance program. It is not     intended to diagnose MRSA     infection nor to guide or     monitor treatment for     MRSA infections.   Studies/Results: Ct Biopsy  01/05/2013   CLINICAL DATA:  Anemia  EXAM: CT-GUIDED bone marrow biopsy and core.  MEDICATIONS AND MEDICAL HISTORY: Versed 2.0 mg, Fentanyl 100 mcg.  Additional Medications: None.  ANESTHESIA/SEDATION: Moderate sedation time: 10 minutes  PROCEDURE: The procedure, risks, benefits, and alternatives were explained to the patient. Questions regarding the procedure were encouraged and answered. The  patient understands and consents to the procedure.  The back was prepped with Betadine in a sterile fashion, and a sterile drape was applied covering the operative field. A sterile gown and sterile gloves were used for the procedure.  Under CT guidance, a(n) 11 gauge guide needle was advanced into the right iliac bone via posterior approach. Aspirates were obtained. A core was obtained. Final imaging was performed.  Patient tolerated the procedure well without complication. Vital sign monitoring by nursing staff during the procedure will continue as patient is in the special procedures unit for post procedure observation.  FINDINGS: The images document guide needle placement within the right iliac bone via posterior approach. Post biopsy images demonstrate no hemorrhage.  IMPRESSION: Successful CT-guided bone marrow aspirate and core.   Electronically Signed   By: Maryclare Bean M.D.   On: 01/05/2013 15:28   Medications: I have reviewed the patient's current medications. Scheduled Meds: . calcium acetate  2,001 mg Oral TID WC  . darbepoetin (ARANESP) injection - DIALYSIS  200 mcg Intravenous Q Tue-HD  . doxercalciferol  4 mcg Intravenous Q T,Th,Sa-HD  . feeding supplement  1 Container Oral BID BM  . folic acid  1 mg Oral Daily  . hydrALAZINE  100 mg Oral BID  . levETIRAcetam  1,000 mg Oral BID  . multivitamin with minerals  1 tablet Oral Daily  . pantoprazole  40 mg Oral BID  . sodium chloride  3 mL Intravenous Q12H   Continuous Infusions:  PRN Meds:.acetaminophen Assessment/Plan: Ceasar Decandia is a 57 y.o. male with PMH ESRD on dialysis (TTS), HTN, anemia of chronic disease, cocaine abuse, tobacco and cocaine abuse who presents from dialysis after lab work showed a hemoglobin of 3.9.  #Acute on chronic normocytic anemia - Hgb 3.8> 4.8 s/p 1U PRBC> 7.1 s/p +2 PRBC> 6.5> 7.4 s/p +1U PRBC> 6.5> 7.5 s/p +1U PRBC> 7.2> 6.5> 9.4 s/p +2U PRBC>8.2 this morning. Our transfusion goal is Hgb>7. He had a more  that appropriate hemoglobin response to the 2U PRBCs given yesterday during dialysis, but again dropped a point over night. He denies gross blood loss, dark or tarry stools. CT-guided bone marrow biopsy was performed on 9/19. Heme/onc is aware and will formally see him after the results are back either as an inpatient or  outpatient. Iron studies were repeated yesterday and show an anemia of chronic disease pattern. During prior admissions his chronic anemia was thought to be multifactorial from chronic disease, chronic renal insufficiency, iron deficiency, and occult gastrointestinal blood loss. - Will call GI back again to see if they have any further recommendations given his consistently dropping hemoglobin - Spoke with Dr. Christella Hartigan, GI physician on call. If GIB were the cause, it would be very unusual to see his kind of hemoglobin drops without overt signs (melena, BRBPR, hematemesis). Therefore he doubts GIB is a significant contributor. He would like to see the results of the bone marrow biopsy as well before proceeding, but if we are still concerned they can come by tomorrow to arrange for a capsule endoscopy. - Protonix 40mg  BID po  - Folate 1mg  and MV daily  - Follow up bone marrow biopsy results - PT saw him, recommended no PT follow up - AM CBC  #ESRD (end stage renal disease) on dialysis - On TTS schedule. Patient has been noncompliant with his dialysis as an outpatient, only attending 1 session in the past 2 weeks prior to admission. K has been wnl. CXR negative for pulmonary edema. - Appreciate renal recs - Aranesp (darbepoetin alfa) with dialysis, 200/week - Hectorol 4 mcg with dialysis, Phoslo TID with meals  - Nepro feeding supplement between meals 2/2 low albumin - Renal diet  - AM renal function panel   #?Tuberculosis exposure - Per renal he was reported to have had a positive quantiferon test in [place], [place], recently. Apparently he had a bronchoscopy with negative AFB smears in  late August. - Follow up Quantiferon tb gold assay, had to be re-ordered a second time as it was originally collected in the wrong tube, will call ID if positive   #Diabetes mellitus - Last A1C 5.0 on 11/07/12. Not on medications or insulin. - Will follow BMPs and add CBG monitoring if needed   #Hypertension - BP currently stable at 150s/60s. - Continue home hydralazine  #Seizure disorder - Stable. No seizure activity.  - Continue home keppra   #Cocaine abuse - Patient reports he last abused crack cocaine 3 weeks ago.  - UDS cancelled as patient did not make a urine sample in 2 days  #DVT PPX - SCDs  Dispo: Disposition is deferred at this time, awaiting improvement of current medical problems.  Anticipated discharge in approximately 1-3 day(s).   The patient does have a current PCP Dyke Maes, MD) and does need an [facility] hospital follow-up appointment after discharge.  The patient does not have transportation limitations that hinder transportation to clinic appointments.  .Services Needed at time of discharge: Y = Yes, Blank = No PT:   OT:   RN:   Equipment:   Other:     LOS: 5 days   Vivi Barrack, MD 01/07/2013, 9:00 AM

## 2013-01-07 NOTE — Plan of Care (Signed)
Problem: Acute Rehab PT Goals(only PT should resolve) Goal: Pt Will Go Up/Down Stairs Outcome: Not Met (add Reason) Declined     

## 2013-01-07 NOTE — Progress Notes (Signed)
Physical Therapy Treatment Patient Details Name: [name] MRN: [id number] DOB: 1955-08-14 Today's Date: 01/07/2013 Time: 7829-5621 PT Time Calculation (min): 11 min  PT Assessment / Plan / Recommendation  History of Present Illness [name] is a 57 y.o. male with PMH ESRD on dialysis (TTS), HTN, anemia of chronic disease, cocaine abuse, tobacco abuse who presents from dialysis after lab work showed a hemoglobin of 3.9.  Has received several transfusions, with Hgb up to 7.2.   PT Comments   Patient doing well with ambulation.  Performed modified DGI balance assessment scoring 20/21.  Patient with general weakness.  Encouraged patient to ambulate in hallway 2x/day with nursing.  No other acute PT needs identified - PT will sign off.  Follow Up Recommendations  No PT follow up;Supervision - Intermittent     Does the patient have the potential to tolerate intense rehabilitation     Barriers to Discharge        Equipment Recommendations  None recommended by PT    Recommendations for Other Services    Frequency Min 3X/week   Progress towards PT Goals Progress towards PT goals: Goals met/education completed, patient discharged from PT  Plan Current plan remains appropriate    Precautions / Restrictions Precautions Precautions: None Restrictions Weight Bearing Restrictions: No   Pertinent Vitals/Pain     Mobility  Bed Mobility Bed Mobility: Supine to Sit;Sit to Supine Supine to Sit: 7: Independent;HOB flat Sit to Supine: 7: Independent;HOB flat Transfers Transfers: Sit to Stand;Stand to Sit Sit to Stand: 6: Modified independent (Device/Increase time);From bed Stand to Sit: 6: Modified independent (Device/Increase time);To bed Details for Transfer Assistance: Increased time with transfers Ambulation/Gait Ambulation/Gait Assistance: 5: Supervision Ambulation Distance (Feet): 250 Feet Assistive device: None Ambulation/Gait Assistance Details: Patient with good gait  pattern, balance, and gait speed. Gait Pattern: Within Functional Limits Gait velocity: WFL      PT Goals (current goals can now be found in the care plan section)    Visit Information  Last PT Received On: 01/07/13 Assistance Needed: +1 History of Present Illness: [name] is a 57 y.o. male with PMH ESRD on dialysis (TTS), HTN, anemia of chronic disease, cocaine abuse, tobacco abuse who presents from dialysis after lab work showed a hemoglobin of 3.9.  Has received several transfusions, with Hgb up to 7.2.    Subjective Data  Subjective: I feel a little better today.   Cognition  Cognition Arousal/Alertness: Awake/alert Behavior During Therapy: WFL for tasks assessed/performed Overall Cognitive Status: Within Functional Limits for tasks assessed    Balance  Balance Balance Assessed: Yes Standardized Balance Assessment Standardized Balance Assessment: Dynamic Gait Index Dynamic Gait Index Level Surface: Normal Change in Gait Speed: Normal Gait with Horizontal Head Turns: Normal Gait with Vertical Head Turns: Normal Gait and Pivot Turn: Normal Step Over Obstacle: Mild Impairment Step Around Obstacles: Normal Steps:  (NT)  End of Session PT - End of Session Activity Tolerance: Patient tolerated treatment well;Patient limited by fatigue Patient left: in bed;with call bell/phone within reach Nurse Communication: Mobility status (Encouraged ambulation in hallway with nursing)   GP     Vena Austria 01/07/2013, 4:56 PM Durenda Hurt. Renaldo Fiddler, [facility] Acute Rehab Services Pager [phone]

## 2013-01-08 LAB — RENAL FUNCTION PANEL
Albumin: 2.4 g/dL — ABNORMAL LOW (ref 3.5–5.2)
CO2: 26 mEq/L (ref 19–32)
Chloride: 95 mEq/L — ABNORMAL LOW (ref 96–112)
GFR calc Af Amer: 9 mL/min — ABNORMAL LOW (ref >90)
GFR calc non Af Amer: 8 mL/min — ABNORMAL LOW (ref >90)
Glucose, Bld: 139 mg/dL — ABNORMAL HIGH (ref 70–99)
Phosphorus: 2 mg/dL — ABNORMAL LOW (ref 2.3–4.6)
Potassium: 4.4 mEq/L (ref 3.5–5.1)
Sodium: 131 mEq/L — ABNORMAL LOW (ref 135–145)

## 2013-01-08 LAB — RETICULOCYTES
Retic Count, Absolute: 29.7 10*3/uL (ref 19.0–186.0)
Retic Ct Pct: 1.1 % (ref 0.4–3.1)

## 2013-01-08 LAB — LACTATE DEHYDROGENASE: LDH: 181 U/L (ref 94–250)

## 2013-01-08 LAB — CBC
HCT: 23.4 % — ABNORMAL LOW (ref 39.0–52.0)
Hemoglobin: 7.7 g/dL — ABNORMAL LOW (ref 13.0–17.0)
MCHC: 32.9 g/dL (ref 30.0–36.0)
Platelets: 160 10*3/uL (ref 150–400)
WBC: 5.5 10*3/uL (ref 4.0–10.5)

## 2013-01-08 MED ORDER — SODIUM CHLORIDE 0.9 % IV SOLN
125.0000 mg | INTRAVENOUS | Status: DC
  Administered 2013-01-09: 125 mg via INTRAVENOUS
  Filled 2013-01-08 (×2): qty 10

## 2013-01-08 NOTE — Progress Notes (Signed)
  Date: 01/08/2013  Patient name: [name]  Medical record number: [id number]  Date of birth: August 04, 1955   This patient has been seen and the plan of care was discussed with the house staff. Please see their note for complete details. I concur with their findings.  Inez Catalina, MD 01/08/2013, 1:09 PM

## 2013-01-08 NOTE — Progress Notes (Signed)
Subjective: Patient seen at bedside. [name] resting comfortably in bed. He says he feels well. He denies chest pain, SOB, abdominal pain, fatigue and lightheadedness. Denies melena, BRBPR, hematuria, hemoptysis. We talked about discharge planning. He says he has a place to stay in [place], and he has good transportation to dialysis via [facility]. We talked about the importance of going to dialysis regularly, both for his kidney failure and for regular blood work.  Objective: Vital signs in last 24 hours: Filed Vitals:   01/07/13 1412 01/07/13 2112 01/07/13 2232 01/08/13 0528  BP: 164/74 180/75 179/72 164/62  Pulse: 77 80 79 81  Temp: 98.5 F (36.9 C) 98.5 F (36.9 C) 98.3 F (36.8 C) 98.6 F (37 C)  TempSrc: Oral Oral Oral Oral  Resp: 18 18 18 18   Height:      Weight:    156 lb 12.8 oz (71.124 kg)  SpO2: 99% 98% 98% 96%   Weight change: 18 lb 12.7 oz (8.524 kg)  Intake/Output Summary (Last 24 hours) at 01/08/13 1034 Last data filed at 01/07/13 1413  Gross per 24 hour  Intake    240 ml  Output    300 ml  Net    -60 ml    Physical Exam  Constitutional: [name] oriented to person, place, and time and well-developed, well-nourished, and in no distress. HENT:  Head: Normocephalic and atraumatic.  Eyes: EOM are normal. Pupils are equal, round, and reactive to light.  Neck: Normal range of motion. Neck supple.  Cardiovascular: Normal rate, regular rhythm, normal heart sounds and intact distal pulses. Exam reveals no gallop and no friction rub.  No murmur heard.  Pulmonary/Chest: Effort normal and breath sounds normal. No respiratory distress. He has no wheezes. He has no rales. He exhibits no tenderness. Abdominal: Soft. Bowel sounds are normal. He exhibits no distension and no mass. There is no tenderness. There is no rebound and no guarding.  Musculoskeletal: Normal range of motion. He exhibits no edema and no tenderness.  Thin extremities.  Neurological: [name] alert and oriented  to person, place, and time. No cranial nerve deficit. GCS score is 15.  Skin: Skin is warm and dry. No rash noted.  Psychiatric: Mood and affect normal.    Lab Results: Basic Metabolic Panel:  Recent Labs Lab 01/06/13 0757 01/08/13 0548  NA 130* 131*  K 4.6 4.4  CL 92* 95*  CO2 28 26  GLUCOSE 90 139*  BUN 79* 68*  CREATININE 7.14* 6.85*  CALCIUM 7.8* 8.2*  PHOS 4.5 2.0*   Liver Function Tests:  Recent Labs Lab 01/02/13 0950  01/06/13 0757 01/08/13 0548  AST 19  --   --   --   ALT 12  --   --   --   ALKPHOS 34*  --   --   --   BILITOT 0.2*  --   --   --   PROT 5.9*  --   --   --   ALBUMIN 2.8*  < > 2.3* 2.4*  < > = values in this interval not displayed. CBC:  Recent Labs Lab 01/02/13 0950  01/07/13 0544 01/08/13 0835  WBC 5.6  < > 5.0 5.5  NEUTROABS 3.9  --   --   --   HGB 3.8*  < > 8.2* 7.7*  HCT 11.4*  < > 24.4* 23.4*  MCV 89.1  < > 88.7 88.6  PLT 215  < > 147* 160  < > =  values in this interval not displayed. Cardiac Enzymes:  Recent Labs Lab 01/02/13 1430 01/02/13 2102 01/03/13 0550  TROPONINI <0.30 <0.30 <0.30   Coagulation:  Recent Labs Lab 01/02/13 0950  LABPROT 13.0  INR 1.00    Micro Results: Recent Results (from the past 240 hour(s))  MRSA PCR SCREENING     Status: None   Collection Time    01/02/13  2:56 PM      Result Value Range Status   MRSA by PCR NEGATIVE  NEGATIVE Final   Comment:            The GeneXpert MRSA Assay (FDA     approved for NASAL specimens     only), is one component of a     comprehensive MRSA colonization     surveillance program. It is not     intended to diagnose MRSA     infection nor to guide or     monitor treatment for     MRSA infections.   Studies/Results: No results found. Medications: I have reviewed the patient's current medications. Scheduled Meds: . calcium acetate  2,001 mg Oral TID WC  . darbepoetin (ARANESP) injection - DIALYSIS  200 mcg Intravenous Q Tue-HD  . doxercalciferol   4 mcg Intravenous Q T,Th,Sa-HD  . feeding supplement  1 Container Oral BID BM  . folic acid  1 mg Oral Daily  . hydrALAZINE  100 mg Oral BID  . levETIRAcetam  1,000 mg Oral BID  . multivitamin with minerals  1 tablet Oral Daily  . pantoprazole  40 mg Oral BID  . sodium chloride  3 mL Intravenous Q12H   Continuous Infusions:  PRN Meds:.acetaminophen Assessment/Plan: Naftoli Penny is a 57 y.o. male with PMH ESRD on dialysis (TTS), HTN, anemia of chronic disease, cocaine abuse, tobacco and cocaine abuse who presents from dialysis after lab work showed a hemoglobin of 3.9.  #Acute on chronic normocytic anemia - Hgb 3.8> 4.8 s/p 1U PRBC> 7.1 s/p +2 PRBC> 6.5> 7.4 s/p +1U PRBC> 6.5> 7.5 s/p +1U PRBC> 7.2> 6.5> 9.4 s/p +2U PRBC> 8.2>7.7 this morning. Our transfusion goal is Hgb>7. Relatively stable Hgb this morning. Patient still denies gross blood loss, dark or tarry stools. CT-guided bone marrow biopsy was performed on 9/19. Heme/onc is aware and will formally see him after the results are back either as an inpatient or outpatient. GI would also like to see the results of the biopsy before proceeding with further work up i.e. capsule endoscopy. Iron studies were repeated and did show an anemia of chronic disease pattern. During prior admissions his chronic anemia was thought to be multifactorial from chronic disease, chronic renal insufficiency, iron deficiency, and occult gastrointestinal blood loss. - Protonix 40mg  BID po  - Folate 1mg  and MV daily  - Follow up bone marrow biopsy results - will curbside pathologist today - PT saw him, recommended no PT follow up - AM CBC, if stable will consider discharge tomorrow  #ESRD (end stage renal disease) on dialysis - On TTS schedule. Patient has been noncompliant with his dialysis as an outpatient, only attending 1 session in the past 2 weeks prior to admission. K has been wnl. CXR negative for pulmonary edema. - Appreciate renal recs - Aranesp  (darbepoetin alfa) with dialysis, 200/week - Venofer load with HD per renal - Hectorol 4 mcg with dialysis, Phoslo TID with meals  - Nepro feeding supplement between meals 2/2 low albumin - Renal diet  - AM renal function panel   #?  Tuberculosis exposure - Per renal he was reported to have had a positive quantiferon test in [place], [place], recently. Apparently he had a bronchoscopy with negative AFB smears in late August. - Follow up Quantiferon tb gold assay, had to be re-ordered a second time as it was originally collected in the wrong tube, will call ID if positive   #Diabetes mellitus - Last A1C 5.0 on 11/07/12. Not on medications or insulin. - Will follow BMPs and add CBG monitoring if needed   #Hypertension - BP currently stable at 150s/60s. - Continue home hydralazine  #Seizure disorder - Stable. No seizure activity.  - Continue home keppra   #Cocaine abuse - Patient reports he last abused crack cocaine 3 weeks ago.  - UDS cancelled as patient did not make a urine sample in 2 days  #DVT PPX - SCDs  Dispo: Disposition is deferred at this time, awaiting improvement of current medical problems.  Anticipated discharge in approximately 1-3 day(s).   The patient does have a current PCP Dyke Maes, MD) and does need an [facility] hospital follow-up appointment after discharge.  The patient does not have transportation limitations that hinder transportation to clinic appointments.  .Services Needed at time of discharge: Y = Yes, Blank = No PT:   OT:   RN:   Equipment:   Other:     LOS: 6 days   Vivi Barrack, MD 01/08/2013, 10:34 AM

## 2013-01-08 NOTE — Progress Notes (Signed)
[facility] Progress Note  Subjective:   Asleep, but arouses easily.  Denies pain, SOB, fatigue or dark stools.  Objective Filed Vitals:   01/07/13 1412 01/07/13 2112 01/07/13 2232 01/08/13 0528  BP: 164/74 180/75 179/72 164/62  Pulse: 77 80 79 81  Temp: 98.5 F (36.9 C) 98.5 F (36.9 C) 98.3 F (36.8 C) 98.6 F (37 C)  TempSrc: Oral Oral Oral Oral  Resp: 18 18 18 18   Height:      Weight:    71.124 kg (156 lb 12.8 oz)  SpO2: 99% 98% 98% 96%   Physical Exam General: Cooperative, NAD Heart: RRR 2/6 SEM RUSB, no rub or gallop Lungs: CTA bilat, no wheezes, rales or rhonchi rare basilar crackles bilat Abdomen: Soft, NT, non-distended, nl BS Extremities: No LE edema Dialysis Access: LUA AVF + bruit  Dialysis Orders: TTS @ AF  4 hrs 60.5 kg 2K/2.25Ca 400/A1.5 Heparin 1000 U AVF @ LUA  Hectorol 4 mcg Epogen 18,000 U Venofer 0   Assessment/Plan: 1. Anemia - admit Hgb 3.8 , now 7.7 s/p total of 7 units PRBC's. TSAT= 15% with 108 Ferritin yesterday/ prior 20% on 12/06/12. Will start IV Fe Load on HD tomorrow x 10 doses then continue weekly. On Aranesp 200 q Tues. CT-guided bone marrow biopsy performed on 9/19, result pending. Heme/onc to see him after the results are back either as an inpatient or outpatient. GI to defer capsule endo/ further w/u pending biopsy results. Previous extensive GI workup with small hiatal hernia per EGD & adenomatous sigmoid polyp per colonoscopy in 08/2010, gastritis per EGD 2011; capsule endoscopy declined in 10/2012; likely multifactorial; On outpatient Epogen 18,000 U, but misses HD.  2. ESRD -  TTS @ AF  K+ 4.4. HD tomorrow no heparin. First shift pending possible d/c 3. Hypertension/volume - BP elevated. Significantly above edw at 71 kg but no sob ? Error vs transfusions Continue Hydralazine 100 mg bid/vol control; CXR negative for edema. Try UF 4-5L tomorrow + standing wgts. 4. Metabolic bone disease - Ca 8.2 (9.5 corrected) Last iPTH 91;  Hectorol 4 mcg, P 2.0 - hold Phoslo 3 with meals. Renal panel 5. Nutrition - Alb 2.4, renal diet, vitamin, nepro 6. 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 Quantiferon reordered for this admit (pending) - if positive would need to get ID input re treatment 7. Hepatitis C 8. DM Type 2 9. Hx seizure disorder 10. Hx cocaine abuse 11. Dispo - Per primary, possibly tomorrow if CBC stable. Heme/onc to follow up bx results. Plan for HD first shift, continue IV fe load and increase Epo to max dose.  Scot Jun. Broadus John, PA-C [facility] Pager [phone] 01/08/2013,11:38 AM  LOS: 6 days   Patient seen and examined.  I agree with assessment and plan as above with additions as indicated. Plan HD in am, up 10kg, will reweigh prior to HD tomorrow.  Vinson Moselle  MD Pager [phone]    Cell  [phone] 01/08/2013, 3:20 PM     Additional Objective Labs: Basic Metabolic Panel:  Recent Labs Lab 01/05/13 0555 01/06/13 0757 01/08/13 0548  NA 137 130* 131*  K 3.9 4.6 4.4  CL 98 92* 95*  CO2 30 28 26   GLUCOSE 95 90 139*  BUN 49* 79* 68*  CREATININE 4.84* 7.14* 6.85*  CALCIUM 7.4* 7.8* 8.2*  PHOS 3.4 4.5 2.0*   Liver Function Tests:  Recent  Labs Lab 01/02/13 0950  01/05/13 0555 01/06/13 0757 01/08/13 0548  AST 19  --   --   --   --   ALT 12  --   --   --   --   ALKPHOS 34*  --   --   --   --   BILITOT 0.2*  --   --   --   --   PROT 5.9*  --   --   --   --   ALBUMIN 2.8*  < > 2.2* 2.3* 2.4*  < > = values in this interval not displayed. No results found for this basename: LIPASE, AMYLASE,  in the last 168 hours CBC:  Recent Labs Lab 01/02/13 0950  01/05/13 0555 01/06/13 0757 01/06/13 1445 01/07/13 0544 01/08/13 0835  WBC 5.6  < > 5.7 6.4 5.4 5.0 5.5  NEUTROABS 3.9  --   --   --   --   --   --   HGB 3.8*  < > 7.2* 6.5* 9.4* 8.2* 7.7*  HCT 11.4*  < > 21.4* 19.3* 27.6* 24.4* 23.4*  MCV 89.1  < > 87.0  88.1 87.1 88.7 88.6  PLT 215  < > 168 160 165 147* 160  < > = values in this interval not displayed. Blood Culture    Component Value Date/Time   SDES URINE, RANDOM 11/07/2012 1903   SPECREQUEST NONE 11/07/2012 1903   CULT NO GROWTH 11/07/2012 1903   REPTSTATUS 11/08/2012 FINAL 11/07/2012 1903    Cardiac Enzymes:  Recent Labs Lab 01/02/13 1430 01/02/13 2102 01/03/13 0550  TROPONINI <0.30 <0.30 <0.30   CBG:  Recent Labs Lab 01/03/13 1155 01/03/13 1723 01/03/13 2011  GLUCAP 90 88 112*   Iron Studies:  Recent Labs  01/06/13 0855  IRON 32*  TIBC 220  FERRITIN 108   @lablastinr3 @ Studies/Results: No results found. Medications:   . calcium acetate  2,001 mg Oral TID WC  . darbepoetin (ARANESP) injection - DIALYSIS  200 mcg Intravenous Q Tue-HD  . doxercalciferol  4 mcg Intravenous Q T,Th,Sa-HD  . feeding supplement  1 Container Oral BID BM  . folic acid  1 mg Oral Daily  . hydrALAZINE  100 mg Oral BID  . levETIRAcetam  1,000 mg Oral BID  . multivitamin with minerals  1 tablet Oral Daily  . pantoprazole  40 mg Oral BID  . sodium chloride  3 mL Intravenous Q12H

## 2013-01-08 NOTE — Progress Notes (Signed)
Spoke on the phone with Dr. Laureen Ochs, the pathologist who is reading the patient's bone marrow biopsy over at [facility]. He did note a possible lymphoproliferative process going on in the patient's biopsy. 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 [name] running the specimen through flow cytometry and may stain it as well before writing the final report (which will likely be available tomorrow). In terms of red cell lines all were present and within normal limits.   Vivi Barrack, MD  [email or web address] Pager # [phone] Office # [phone]

## 2013-01-09 ENCOUNTER — Telehealth: Payer: Self-pay | Admitting: Internal Medicine

## 2013-01-09 ENCOUNTER — Encounter: Payer: Self-pay | Admitting: Internal Medicine

## 2013-01-09 ENCOUNTER — Other Ambulatory Visit: Payer: Self-pay | Admitting: Internal Medicine

## 2013-01-09 DIAGNOSIS — D649 Anemia, unspecified: Secondary | ICD-10-CM

## 2013-01-09 LAB — HAPTOGLOBIN: Haptoglobin: 99 mg/dL (ref 45–215)

## 2013-01-09 LAB — RENAL FUNCTION PANEL
Albumin: 2.6 g/dL — ABNORMAL LOW (ref 3.5–5.2)
BUN: 95 mg/dL — ABNORMAL HIGH (ref 6–23)
Chloride: 95 mEq/L — ABNORMAL LOW (ref 96–112)
Creatinine, Ser: 8.49 mg/dL — ABNORMAL HIGH (ref 0.50–1.35)
GFR calc non Af Amer: 6 mL/min — ABNORMAL LOW (ref >90)
Phosphorus: 2 mg/dL — ABNORMAL LOW (ref 2.3–4.6)
Potassium: 5.6 mEq/L — ABNORMAL HIGH (ref 3.5–5.1)

## 2013-01-09 LAB — PREPARE RBC (CROSSMATCH)

## 2013-01-09 LAB — CBC
HCT: 22.3 % — ABNORMAL LOW (ref 39.0–52.0)
HCT: 28.6 % — ABNORMAL LOW (ref 39.0–52.0)
MCH: 29.6 pg (ref 26.0–34.0)
MCHC: 33.6 g/dL (ref 30.0–36.0)
MCHC: 34.3 g/dL (ref 30.0–36.0)
MCV: 88.1 fL (ref 78.0–100.0)
MCV: 87.7 fL (ref 78.0–100.0)
Platelets: 166 10*3/uL (ref 150–400)
Platelets: 163 10*3/uL (ref 150–400)
RDW: 14.3 % (ref 11.5–15.5)
RDW: 14 % (ref 11.5–15.5)
WBC: 5.9 10*3/uL (ref 4.0–10.5)

## 2013-01-09 MED ORDER — DOXERCALCIFEROL 4 MCG/2ML IV SOLN
INTRAVENOUS | Status: AC
  Administered 2013-01-09: 4 ug via INTRAVENOUS
  Filled 2013-01-09: qty 2

## 2013-01-09 MED ORDER — DARBEPOETIN ALFA-POLYSORBATE 200 MCG/0.4ML IJ SOLN
INTRAMUSCULAR | Status: AC
  Administered 2013-01-09: 200 ug via INTRAVENOUS
  Filled 2013-01-09: qty 0.4

## 2013-01-09 NOTE — Procedures (Signed)
I was present at this dialysis session. I have reviewed the session itself and made appropriate changes.   Vinson Moselle  MD Pager [phone]    Cell  [phone] 01/09/2013, 10:58 AM

## 2013-01-09 NOTE — Telephone Encounter (Signed)
PT SCHEDULED 10/07 @ @ 11 W/DR. CHISM WELCOME PACKET MAILED.

## 2013-01-09 NOTE — Progress Notes (Signed)
[facility] Progress Note  Subjective:   Asleep, but arouses easily.  Denies pain, SOB, fatigue or dark stools.  Objective Filed Vitals:   01/09/13 0900 01/09/13 0930 01/09/13 1000 01/09/13 1030  BP: 147/63 121/58 116/54 119/58  Pulse: 75 73 72 72  Temp:      TempSrc:      Resp:      Height:      Weight:      SpO2:       Physical Exam General: Cooperative, NAD Heart: RRR 2/6 SEM RUSB, no rub or gallop Lungs: CTA bilat, no wheezes, rales or rhonchi rare basilar crackles bilat Abdomen: Soft, NT, non-distended, nl BS Extremities: No LE edema Dialysis Access: LUA AVF + bruit  Dialysis Orders: TTS @ AF  4 hrs 60.5 kg 2K/2.25Ca 400/A1.5 Heparin 1000 U AVF @ LUA  Hectorol 4 mcg Epogen 18,000 U Venofer 0   Assessment/Plan: 1. Anemia - admit Hgb 3.8 , now 7's, s/p 7u prbc transfusion. On max aranesp and started IV iron load. Heme/onc to see him after the results are back as outpatient. For discharge today, have d/w primary team, will transfuse 2u prbc's with HD today for Hb 7.7 prior to discharge    2. ESRD, HD TTS 3. Hypertension/volume - vol up, try UF 4-5 L today with HD 4. Metabolic bone disease - Ca 8.2 (9.5 corrected) Last iPTH 91; Hectorol 4 mcg, P 2.0 - hold Phoslo 3 with meals. Renal panel 5. Nutrition - Alb 2.4, renal diet, vitamin, nepro 6. 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 Quantiferon reordered for this admit (pending) - if positive would need to get ID input re treatment 7. Hepatitis C 8. DM Type 2 9. Hx seizure disorder 10. Hx cocaine abuse  Vinson Moselle  MD Pager [phone]    Cell  [phone] 01/09/2013, 10:57 AM     Additional Objective Labs: Basic Metabolic Panel:  Recent Labs Lab 01/05/13 0555 01/06/13 0757 01/08/13 0548  NA 137 130* 131*  K 3.9 4.6 4.4  CL 98 92* 95*  CO2 30 28 26   GLUCOSE 95 90 139*  BUN 49* 79* 68*  CREATININE 4.84* 7.14* 6.85*  CALCIUM  7.4* 7.8* 8.2*  PHOS 3.4 4.5 2.0*   Liver Function Tests:  Recent Labs Lab 01/05/13 0555 01/06/13 0757 01/08/13 0548  ALBUMIN 2.2* 2.3* 2.4*   No results found for this basename: LIPASE, AMYLASE,  in the last 168 hours CBC:  Recent Labs Lab 01/06/13 0757 01/06/13 1445 01/07/13 0544 01/08/13 0835 01/09/13 0500  WBC 6.4 5.4 5.0 5.5 5.9  HGB 6.5* 9.4* 8.2* 7.7* 7.5*  HCT 19.3* 27.6* 24.4* 23.4* 22.3*  MCV 88.1 87.1 88.7 88.6 88.1  PLT 160 165 147* 160 166   Blood Culture    Component Value Date/Time   SDES URINE, RANDOM 11/07/2012 1903   SPECREQUEST NONE 11/07/2012 1903   CULT NO GROWTH 11/07/2012 1903   REPTSTATUS 11/08/2012 FINAL 11/07/2012 1903    Cardiac Enzymes:  Recent Labs Lab 01/02/13 1430 01/02/13 2102 01/03/13 0550  TROPONINI <0.30 <0.30 <0.30   CBG:  Recent Labs Lab 01/03/13 1155 01/03/13 1723 01/03/13 2011  GLUCAP 90 88 112*   Iron Studies: No results found for this basename: IRON, TIBC, TRANSFERRIN, FERRITIN,  in the last 72 hours @lablastinr3 @ Studies/Results: No results found. Medications:   . darbepoetin      . darbepoetin (ARANESP) injection -  DIALYSIS  200 mcg Intravenous Q Tue-HD  . doxercalciferol      . doxercalciferol  4 mcg Intravenous Q T,Th,Sa-HD  . feeding supplement  1 Container Oral BID BM  . ferric gluconate (FERRLECIT/NULECIT) IV  125 mg Intravenous Q T,Th,Sa-HD  . folic acid  1 mg Oral Daily  . hydrALAZINE  100 mg Oral BID  . levETIRAcetam  1,000 mg Oral BID  . multivitamin with minerals  1 tablet Oral Daily  . pantoprazole  40 mg Oral BID  . sodium chloride  3 mL Intravenous Q12H

## 2013-01-09 NOTE — Discharge Summary (Signed)
Name: [name] MRN: [id number] DOB: 1955/06/20 57 y.o. PCP: Dyke Maes, MD  Date of Admission: 01/02/2013  8:42 AM Date of Discharge: 01/09/2013 Attending Physician: Inez Catalina, MD  Discharge Diagnosis:  1. Acute on chronic normocytic anemia 2. ESRD (end stage renal disease) on dialysis 3. Possible tuberculosis exposure 4. Hypertension 5. Diabetes mellitus 6. Seizure disorder  Discharge Medications:   Medication List         calcium acetate 667 MG capsule  Commonly known as:  PHOSLO  Take 2,001 mg by mouth 3 (three) times daily with meals.     ethyl chloride spray  Apply 1 application topically as needed.     folic acid 1 MG tablet  Commonly known as:  FOLVITE  Take 1 mg by mouth daily.     hydrALAZINE 100 MG tablet  Commonly known as:  APRESOLINE  Take 100 mg by mouth 2 (two) times daily.     levETIRAcetam 1000 MG tablet  Commonly known as:  KEPPRA  Take 1,000 mg by mouth 2 (two) times daily.     multivitamin with minerals tablet  Take 1 tablet by mouth daily.     pantoprazole 40 MG tablet  Commonly known as:  PROTONIX  Take 40 mg by mouth daily.        Disposition and follow-up:   [name] was discharged from [facility] in Stable condition.  At the hospital follow up visit please address:  1.  Is he attending dialysis as instructed? Please also reinforce important of following up with heme, GI.  2.  Labs / imaging needed at time of follow-up: CBC, renal function panel  3.  Pending labs/ test needing follow-up: None  Follow-up Appointments: Follow-up Information   Schedule an appointment as soon as possible for a visit with MATTINGLY,MICHAEL T, MD. (This is your kidney doctor and primary care doctor. Please see him at your regular dialysis sessions.)    Specialty:  Nephrology   Contact information:   [address] [place] [address] [phone]       Follow up with CHISM, DAVID, MD On 01/23/2013.  (@11am . This is your blood doctor. Please don't miss this appointment. He will discuss the results of your bone marrow biopsy with you.)    Specialty:  Internal Medicine   Contact information:   [address] [place] [address] [phone]       Follow up with Yancey Flemings, MD On 02/09/2013. (@1 :45pm. This is your GI doctor.)    Specialty:  Gastroenterology   Contact information:   [address] [place] [address] [phone]       Discharge Instructions: Discharge Orders   Future Appointments Provider Department Dept Phone   01/23/2013 11:00 AM [facility] Financial Counselor [facility] [phone]   01/23/2013 11:30 AM [facility] Covering Provider [facility] [phone]   02/09/2013 1:45 PM Hilarie Fredrickson, MD [facility] [phone]   Future Orders Complete By Expires   Diet - low sodium heart healthy  As directed    Increase activity slowly  As directed       Consultations: Treatment Team:  Sadie Haber, MD  Procedures Performed:  Dg Chest 2 View  01/02/2013   *RADIOLOGY REPORT*  Clinical Data: Weakness  CHEST - 2 VIEW  Comparison: 12/19/2012  Findings: The cardiac shadow is stable.  Some left basilar atelectasis is noted new from prior study.  The lungs are otherwise clear.  Mild hyperinflation is again noted.  No acute bony abnormality is seen.  IMPRESSION: New mild atelectasis in the left base.  No focal infiltrate is seen.   Original Report Authenticated By: Alcide Clever, M.D.   Dg Chest 2 View  12/19/2012   CLINICAL DATA:  Cough, shortness of Breath.  EXAM: CHEST  2 VIEW  COMPARISON:  05/06/2012  FINDINGS: The heart size and mediastinal contours are within normal limits. Both lungs are clear. The visualized skeletal structures are unremarkable.  IMPRESSION: No active cardiopulmonary disease.   Electronically Signed   By: Charlett Nose   On: 12/19/2012 13:32   Ct  Biopsy  01/05/2013   CLINICAL DATA:  Anemia  EXAM: CT-GUIDED bone marrow biopsy and core.  MEDICATIONS AND MEDICAL HISTORY: Versed 2.0 mg, Fentanyl 100 mcg.  Additional Medications: None.  ANESTHESIA/SEDATION: Moderate sedation time: 10 minutes  PROCEDURE: The procedure, risks, benefits, and alternatives were explained to the patient. Questions regarding the procedure were encouraged and answered. The patient understands and consents to the procedure.  The back was prepped with Betadine in a sterile fashion, and a sterile drape was applied covering the operative field. A sterile gown and sterile gloves were used for the procedure.  Under CT guidance, a(n) 11 gauge guide needle was advanced into the right iliac bone via posterior approach. Aspirates were obtained. A core was obtained. Final imaging was performed.  Patient tolerated the procedure well without complication. Vital sign monitoring by nursing staff during the procedure will continue as patient is in the special procedures unit for post procedure observation.  FINDINGS: The images document guide needle placement within the right iliac bone via posterior approach. Post biopsy images demonstrate no hemorrhage.  IMPRESSION: Successful CT-guided bone marrow aspirate and core.   Electronically Signed   By: Maryclare Bean M.D.   On: 01/05/2013 15:28   BONE MARROW REPORT 01/05/13 Diagnosis Bone Marrow, Aspirate,Biopsy, and Clot, right iliac - NORMOCELLULAR BONE MARROW FOR AGE WITH ATYPICAL LYMPHOID AGGREGATES. - TRILINEAGE HEMATOPOIESIS. - SEE COMMENT PERIPHERAL BLOOD: - NORMOCYTIC-NORMOCHROMIC ANEMIA.  Diagnosis Note The bone marrow shows several variably sized interstitial and paratrabecular lymphoid aggregates mostly composed of small lymphoid cells. Flow cytometric analysis failed to show any significant changes likely due to sampling. Immunohistochemical stains performed on core biopsy show that the lymphoid aggregates are a mixture of T and B cells  but with a significant B cell component. B cells in particular lack CD5, CD10, or cyclin D1 expression. The overall histologic and phenotypic features are worrisome for involvement by a B cell lymphoproliferative process. Full hematologic evaluation is recommended. (BNS:gt, 01/09/13) Guerry Bruin MD Pathologist, Electronic Signature    Admission HPI:  [name] is a 57 y.o. male with PMH ESRD on dialysis (TTS), HTN, anemia of chronic disease, cocaine abuse, tobacco and cocaine abuse who presents from dialysis after lab work showed a hemoglobin of 3.9.   He states that for the past few days he has had fatigue, lightheadedness (especially when standing from sitting), and dyspnea on exertion. He denies headache, visual changes, chest pain, nausea, vomiting, diarrhea, constipation, myalgias, arthralgias, leg swelling, changes in po intake. He denies any gross bleeding, including bloody stools, dark or tarry stools, hematemesis, hemoptysis. He says he feels like he is anemic. He admits to missing hemodialysis regularly. He has attended only 1 session in the past 2 weeks (on 12/28/12; his session today was aborted). [name] supposed to get Epogen 18,000 U with  dialysis.   Of note he has had multiple similar episodes of anemia discovered at dialysis requiring hospitalization, but never this severe. Most recently he was admitted on November 07, 2012, with a Hgb 7.2. Extensive laboratory work up for anemia was performed including iron studies, which showed an anemia of chronic disease pattern, borderline iron deficiency (Fe 41), borderline low haptoglobin with normal LDH and bilirubin. At that time, he also had a positive FOBT. GI was consulted. Previous work up included: EGD 08/2010 showing small hiatal hernia, colonoscopy 08/2010 showing adenomatous sigmoid polyp, EGD 2011 showing gastritis. The only diagnostic GI testing they recommended that had not been done recently was capsule endoscopy to rule out  arteriovenous malformation, which the patient refused. (A prior note from 11/13 suggests he may have actually had a capsule endoscopy in 2012 in [place], [place], but we do not have the records.) His case was discussed with Dr. Truett Perna (hemetology) who suggested bone marrow biopsy as an outpatient, which the patient also refused. In the end, the team's conclusion was that his anemia was multifactorial from chronic disease, chronic renal insufficiency, iron deficiency, and occult gastrointestinal blood loss.  He was also apparently recently hospitalized at [facility] in late August for acute respiratory failure with concern for latent tuberculosis, with negative workup. Unfortunately their records do not appear to be in EPIC.   In the ED his hemoglobin was measured at 3.8. He received 2 U PRBCs with plans for an additional 2 U with dialysis this afternoon. Renal is on board.   Hospital Course by problem list:   1. Acute on chronic normocytic anemia - On admission, Hgb 3.8, HCT 11.4, MCV 89.1, RDW 17.7. Not pancytopenic as WBC 5.6, plt 215. He required 7U PRBCs before his Hgb finally stabilized at 7.5-8.2 during the 48 hours prior to discharge. He consistently denied historical or current gross blood loss, including melena, BRBPR, hematemesis, hemoptysis, hematuria. Of note he has had multiple similar episodes of anemia discovered at dialysis requiring hospitalization, including in 11/13 and 07/14. During prior admissions his chronic anemia was thought to be multifactorial from chronic disease, chronic renal insufficiency, iron deficiency, and occult gastrointestinal blood loss. There has also been concern for a bone marrow process in light of prior low retic count and no evidence of active hemolysis. The patient finally agreed to CT-guided bone marrow biopsy this admission, performed on 9/19. Per pathology the histologic and phenotypic features were worrisome for involvement by a B cell  lymphoproliferative process. Full hematologic evaluation was recommended. Heme/onc is aware of the patient and will formally see him in clinic on 10/7. Hemolysis labs were repeated and unremarkable.   He did have a positive FOBT during this (and all prior) admissions. GI was consulted and felt GIB was likely *not* the most significant contributor to his anemia. Previous work up was relatively benign, including: EGD 08/2010 showing small hiatal hernia, colonoscopy 08/2010 showing adenomatous sigmoid polyp, EGD 2011 showing gastritis. The only diagnostic testing they recommended that had not been done was capsule endoscopy to rule out AVM, which is better performed as an outpatient. A clinic appointment with [facility] has been arranged. He was placed on Protonix 40mg  BID as an inpatient and discharged on a daily regimen. At his last dialysis session on day of discharge, renal gave an additional 2U PRBCs without complication. Final, post-transfusion Hgb was 9.8.  2. ESRD (end stage renal disease) on dialysis - On TTS schedule. Patient had been noncompliant with his  dialysis as an outpatient, only attending 1 session in the past 2 weeks prior to admission. Was also  supposed to be getting EPO during outpatient dialysis sessions. K has been wnl. CXR negative for pulmonary edema. Renal gave Aranesp (darbepoetin alfa) with dialysis, 200/week (max dose). They also started IV Fe load with HD on the day of discharge (9/23). Plan for x 10 doses, then weekly. He got Hectorol 4 mcg with dialysis. Nepro feeding supplement was given between meals 2/2 low albumin. Patient was extensively counseled on importance of attending all dialysis sessions, both for his ESRD and for follow up of his anemia labs.  3. Possible tuberculosis exposure - He was reported to have had a positive quantiferon test in [place], [place], recently. Apparently he had a bronchoscopy with negative AFB smears in late August. However repeat Quantiferon tb  gold assay was NEGATIVE.  4. Hypertension -Stable on home hydralazine.  5. Diabetes mellitus - Last A1C 5.0 on 11/07/12. Not on medications or insulin. CBG glucose levels were wnl.  6.Seizure disorder - Stable. No seizure activity. We continued his home keppra.   Discharge Vitals:   BP 146/76  Pulse 70  Temp(Src) 98.3 F (36.8 C) (Oral)  Resp 18  Ht 5\' 9"  (1.753 m)  Wt 137 lb 2 oz (62.2 kg)  BMI 20.24 kg/m2  SpO2 96%  Discharge Labs:  Results for orders placed during the hospital encounter of 01/02/13 (from the past 24 hour(s))  HAPTOGLOBIN     Status: None   Collection Time    01/08/13  3:25 PM      Result Value Range   Haptoglobin 99  45 - 215 mg/dL  LACTATE DEHYDROGENASE     Status: None   Collection Time    01/08/13  3:25 PM      Result Value Range   LDH 181  94 - 250 U/L  RETICULOCYTES     Status: Abnormal   Collection Time    01/08/13  3:25 PM      Result Value Range   Retic Ct Pct 1.1  0.4 - 3.1 %   RBC. 2.70 (*) 4.22 - 5.81 MIL/uL   Retic Count, Manual 29.7  19.0 - 186.0 K/uL  OCCULT BLOOD X 1 CARD TO LAB, STOOL     Status: Abnormal   Collection Time    01/09/13  1:15 AM      Result Value Range   Fecal Occult Bld POSITIVE (*) NEGATIVE  CBC     Status: Abnormal   Collection Time    01/09/13  5:00 AM      Result Value Range   WBC 5.9  4.0 - 10.5 K/uL   RBC 2.53 (*) 4.22 - 5.81 MIL/uL   Hemoglobin 7.5 (*) 13.0 - 17.0 g/dL   HCT 29.5 (*) 28.4 - 13.2 %   MCV 88.1  78.0 - 100.0 fL   MCH 29.6  26.0 - 34.0 pg   MCHC 33.6  30.0 - 36.0 g/dL   RDW 44.0  10.2 - 72.5 %   Platelets 166  150 - 400 K/uL  PREPARE RBC (CROSSMATCH)     Status: None   Collection Time    01/09/13 10:58 AM      Result Value Range   Order Confirmation ORDER PROCESSED BY BLOOD BANK      Signed: Vivi Barrack, MD 01/09/2013, 1:53 PM   Time Spent on Discharge: 40 minutes Services Ordered on Discharge: None Equipment Ordered on Discharge: None

## 2013-01-09 NOTE — Progress Notes (Signed)
Patient discharged home via cab.  Patient has cab voucher from social work. IV removed and discharge instructions reviewed by RN.  Patient verbalized understanding, but was distant and uninterested during discharge teaching.  Patient will follow up with nephrologist, internal medicine, and GI.  Patient in stable condition upon discharge.

## 2013-01-09 NOTE — Progress Notes (Signed)
Subjective: Patient seen at bedside in dialysis. [name] resting comfortably. He says he feels well. He denies chest pain, SOB, abdominal pain, fatigue and lightheadedness. He had a bowel movement yesterday that was FOBT+. He says it was a little soft, no BRB, not sure if it was dark. Denies hematuria, hemoptysis. He would like to go home today. We talked about the importance of going to dialysis regularly, both for his kidney failure and for regular blood work, and attending all of his doctor's appointments.  Objective: Vital signs in last 24 hours: Filed Vitals:   01/09/13 0835 01/09/13 0900 01/09/13 0930 01/09/13 1000  BP: 163/76 147/63 121/58 116/54  Pulse: 75 75 73 72  Temp:      TempSrc:      Resp:      Height:      Weight:      SpO2:       Weight change: -8 lb 4.8 oz (-3.765 kg)  Intake/Output Summary (Last 24 hours) at 01/09/13 1008 Last data filed at 01/08/13 1347  Gross per 24 hour  Intake    240 ml  Output    100 ml  Net    140 ml    Physical Exam  Constitutional: [name] oriented to person, place, and time and well-developed, well-nourished, and in no distress. HENT:  Head: Normocephalic and atraumatic.  Eyes: EOM are normal. Pupils are equal, round, and reactive to light.  Neck: Normal range of motion. Neck supple.  Cardiovascular: Normal rate, regular rhythm, normal heart sounds and intact distal pulses. Exam reveals no gallop and no friction rub.  No murmur heard.  Pulmonary/Chest: Effort normal and breath sounds normal. No respiratory distress. He has no wheezes. He has no rales. He exhibits no tenderness. Abdominal: Soft. Bowel sounds are normal. He exhibits no distension and no mass. There is no tenderness. There is no rebound and no guarding.  Musculoskeletal: Normal range of motion. He exhibits no edema and no tenderness.  Thin extremities.  Neurological: [name] alert and oriented to person, place, and time. No cranial nerve deficit. GCS score is 15.  Skin:  Skin is warm and dry. No rash noted.  Psychiatric: Mood and affect normal.    Lab Results: Basic Metabolic Panel:  Recent Labs Lab 01/06/13 0757 01/08/13 0548  NA 130* 131*  K 4.6 4.4  CL 92* 95*  CO2 28 26  GLUCOSE 90 139*  BUN 79* 68*  CREATININE 7.14* 6.85*  CALCIUM 7.8* 8.2*  PHOS 4.5 2.0*   Liver Function Tests:  Recent Labs Lab 01/06/13 0757 01/08/13 0548  ALBUMIN 2.3* 2.4*   CBC:  Recent Labs Lab 01/08/13 0835 01/09/13 0500  WBC 5.5 5.9  HGB 7.7* 7.5*  HCT 23.4* 22.3*  MCV 88.6 88.1  PLT 160 166   Cardiac Enzymes:  Recent Labs Lab 01/02/13 1430 01/02/13 2102 01/03/13 0550  TROPONINI <0.30 <0.30 <0.30   Coagulation: No results found for this basename: LABPROT, INR,  in the last 168 hours  Micro Results: Recent Results (from the past 240 hour(s))  MRSA PCR SCREENING     Status: None   Collection Time    01/02/13  2:56 PM      Result Value Range Status   MRSA by PCR NEGATIVE  NEGATIVE Final   Comment:            The GeneXpert MRSA Assay (FDA     approved for NASAL specimens     only), is one  component of a     comprehensive MRSA colonization     surveillance program. It is not     intended to diagnose MRSA     infection nor to guide or     monitor treatment for     MRSA infections.   Studies/Results: No results found. Medications: I have reviewed the patient's current medications. Scheduled Meds: . darbepoetin (ARANESP) injection - DIALYSIS  200 mcg Intravenous Q Tue-HD  . doxercalciferol  4 mcg Intravenous Q T,Th,Sa-HD  . feeding supplement  1 Container Oral BID BM  . ferric gluconate (FERRLECIT/NULECIT) IV  125 mg Intravenous Q T,Th,Sa-HD  . folic acid  1 mg Oral Daily  . hydrALAZINE  100 mg Oral BID  . levETIRAcetam  1,000 mg Oral BID  . multivitamin with minerals  1 tablet Oral Daily  . pantoprazole  40 mg Oral BID  . sodium chloride  3 mL Intravenous Q12H   Continuous Infusions:  PRN  Meds:.acetaminophen Assessment/Plan: [name] is a 57 y.o. male with PMH ESRD on dialysis (TTS), HTN, anemia of chronic disease, cocaine abuse, tobacco and cocaine abuse who presents from dialysis after lab work showed a hemoglobin of 3.9.  #Acute on chronic normocytic anemia - Hgb 3.8 on admission. Hgb 7.7>7.5 this morning. Hgb has been stable for 48 hours s/p 7U PRBCs. Patient still denies gross blood loss. CT-guided bone marrow biopsy was performed on 9/19. Per pathology it may show a lymphoproliferative process, but they are still working it up (report pending). Hemolysis labs were repeated and unremarkable. Heme/onc is aware and will formally see him after the results are back, likely as an outpatient. GI to pursue capsule endoscopy as an outpatient. Iron studies were repeated and did show an anemia of chronic disease pattern. During prior admissions his chronic anemia was thought to be multifactorial from chronic disease, chronic renal insufficiency, iron deficiency, and occult gastrointestinal blood loss. - Protonix 40mg  BID po  - Folate 1mg  and MV daily  - Follow up official bone marrow biopsy results - PT saw him, recommended no PT follow up - Medically stable for discharge today   #ESRD (end stage renal disease) on dialysis - On TTS schedule. Patient has been noncompliant with his dialysis as an outpatient, only attending 1 session in the past 2 weeks prior to admission. K has been wnl. CXR negative for pulmonary edema. - Appreciate renal recs - Aranesp (darbepoetin alfa) with dialysis, 200/week - IV Fe load with HD x 10 doses, then weekly - Hectorol 4 mcg with dialysis - Nepro feeding supplement between meals 2/2 low albumin - Renal diet  - Follow up renal function panel  #?Tuberculosis exposure - Per renal he was reported to have had a positive quantiferon test in [place], [place], recently. Apparently he had a bronchoscopy with negative AFB smears in late August. - Follow up  Quantiferon tb gold assay, had to be re-ordered a second time as it was originally collected in the wrong tube, will call ID if positive (lab in process)  #Diabetes mellitus - Last A1C 5.0 on 11/07/12. Not on medications or insulin. - Will follow BMPs and add CBG monitoring if needed   #Hypertension - BP currently stable at 116/54. - Continue home hydralazine  #Seizure disorder - Stable. No seizure activity.  - Continue home keppra   #Cocaine abuse - Patient reports he last abused crack cocaine 3 weeks ago.  - UDS cancelled as patient did not make a urine sample in 2 days  #  DVT PPX - SCDs  Dispo: Disposition is deferred at this time, awaiting improvement of current medical problems.  Anticipated discharge in approximately 1-3 day(s).   The patient does have a current PCP Dyke Maes, MD) and does need an [facility] hospital follow-up appointment after discharge.  The patient does not have transportation limitations that hinder transportation to clinic appointments.  .Services Needed at time of discharge: Y = Yes, Blank = No PT:   OT:   RN:   Equipment:   Other:     LOS: 7 days   [name] Barrack, MD 01/09/2013, 10:08 AM

## 2013-01-09 NOTE — Progress Notes (Signed)
Orders only encounter for referral to [facility] for hospital f/u.  Vivi Barrack, MD  [email or web address] Pager # [phone] Office # [phone]

## 2013-01-10 LAB — TYPE AND SCREEN
Unit division: 0
Unit division: 0

## 2013-01-10 NOTE — Clinical Social Work Note (Signed)
LATE ENTRY FOR 01/09/13 - Patient's nurse requested assistance with transportation for patient as he was ready for discharge. CSW facilitated patient's transport home via cab.  Genelle Bal, MSW, LCSW [phone]

## 2013-01-15 NOTE — Discharge Summary (Signed)
I saw [name] on day of discharge and agree with the discharge plan.

## 2013-01-23 ENCOUNTER — Inpatient Hospital Stay: Payer: Medicare Other

## 2013-01-23 ENCOUNTER — Ambulatory Visit: Payer: Medicare Other

## 2013-01-28 ENCOUNTER — Other Ambulatory Visit: Payer: Self-pay | Admitting: Internal Medicine

## 2013-01-28 DIAGNOSIS — D649 Anemia, unspecified: Secondary | ICD-10-CM

## 2013-01-28 DIAGNOSIS — N186 End stage renal disease: Secondary | ICD-10-CM

## 2013-01-29 ENCOUNTER — Telehealth: Payer: Self-pay | Admitting: Internal Medicine

## 2013-01-29 ENCOUNTER — Encounter: Payer: Self-pay | Admitting: Internal Medicine

## 2013-01-29 ENCOUNTER — Ambulatory Visit: Payer: Medicare Other

## 2013-01-29 ENCOUNTER — Ambulatory Visit (HOSPITAL_BASED_OUTPATIENT_CLINIC_OR_DEPARTMENT_OTHER): Payer: Medicare Other | Admitting: Internal Medicine

## 2013-01-29 VITALS — BP 163/76 | HR 79 | Temp 97.9°F | Resp 20 | Ht 69.0 in | Wt 137.9 lb

## 2013-01-29 DIAGNOSIS — D649 Anemia, unspecified: Secondary | ICD-10-CM

## 2013-01-29 DIAGNOSIS — F1410 Cocaine abuse, uncomplicated: Secondary | ICD-10-CM

## 2013-01-29 DIAGNOSIS — N186 End stage renal disease: Secondary | ICD-10-CM

## 2013-01-29 DIAGNOSIS — F17200 Nicotine dependence, unspecified, uncomplicated: Secondary | ICD-10-CM

## 2013-01-29 DIAGNOSIS — Z992 Dependence on renal dialysis: Secondary | ICD-10-CM

## 2013-01-29 NOTE — Progress Notes (Signed)
Checked in patient for hosp follow up. Gave appt card.

## 2013-01-29 NOTE — Progress Notes (Signed)
[name]  [name] Maes, MD [address] [place] [address]  DIAGNOSIS: Normocytic anemia - Plan: CBC with Differential, Comprehensive metabolic panel  ESRD (end stage renal disease) on dialysis - Plan: CBC with Differential, Comprehensive metabolic panel  Anemia - Plan: CBC with Differential, Comprehensive metabolic panel  Chief Complaint  Patient presents with  . Anemia    CURRENT THERAPY: Observation.   INTERVAL HISTORY: [name] 57 y.o. male with a history of polysubstance abuse including crack cocaine and tobacco, ESRDz on hemodialysis (Tues,Thursday, Saturday) and hypertension, anemia of chronic disease, and diabetes mellitus is here for follow-up.  He was recently discharged on 01/09/2013 from [facility] following a one-week admission secondary to profound anemia (hemoglobin of 3.9 ) on admission.  At the time, he endorsed feeling fatigue, lightheadness and dyspnea on exertion.  He admitted to missing hemodialysis regularly and attended only one session in the past 2 weeks (on 12/28/12) per medical records.  He was on epogen 18,000 Units with dialysis.    He has had multiple prior admissions due to anemia, but none this severe.  Iron studies during this admission were consistent with anemia of chronic disease.  However, he had a positive fecal occult blood testing.  GI was consulted, but patient refused capsule endoscopy to rule out arteriovenous malformation.  He has had two prior EGDs in May 2012 and and one in 2011.  The latter showed gastritis; former show small hiatal hernia. Anoscopy was also obtained on May 19 Aug 2010 show an adenomatous sigmoid polyp during this admission, he was discharged on protonic 40 mg twice a day. In addition he was given 2 units of packed red blood cells on the day of discharge with dialysis. Of [name], patient was also admitted and hospitalized at [facility] in late  August for acute respiratory area concern for latent tuberculosis, with a negative workup. Repeat quantitative hearing tuberculosis gold assay was negative during this hospitalization.  On 01/05/2013, he had a CT-guided bone marrow biopsy. It demonstrated several variably sized interstitial and paratrabecular lymphoid aggregates mostly composed of small lymphoid cells. Flow cytometric analysis failed to show any significant changes likely due to sampling. Immunohistochemical stains performed on the core biopsy showed lymphoid aggregates with a mixture of T and B cells but with a significant B-cell component. The B cells lacked CD5, CD19 and, or cyclin D1 expression. The overall histologic and phenotypic features were worrisome for involvement of the B-cell lymphoproliferative process. Peripheral blood showed normocytic normochromic anemia. On discharge she was referred to hematology for further evaluation and management based on his bone marrow results.   Today, he reports using crack cocaine about twice weekly with his last use about 5 days ago.  He also reports feeling depressed about his multiple co-morbidities being a factor in his relapse.  He denies drenching nightsweats or weight lost or acute shortness of breath.    MEDICAL HISTORY: Past Medical History  Diagnosis Date  . Hypertension   . Anemia, chronic disease   . CHF (congestive heart failure)     diastolic.  EF 60 - [name]% per [facility] eco 11/2011  . Cocaine abuse     mentioned in notes from [place]  . Hepatitis C antibody test positive     was HIV negative, 02/28/12  . Hepatitis B core antibody positive     03/01/10  . Positive QuantiFERON-TB Gold test     11/2011  . Helicobacter pylori gastritis  not defined if this was treated  . Polyp of colon, adenomatous     May 2012.  Dr [name] in [place]  . Shortness of breath   . Hematochezia   . Head injury, closed, with concussion   . History of blood transfusion     "last  one was 2 days ago" (11/09/2012)  . Type II diabetes mellitus     on oral pills only  . Arthritis     "right shoulder" (11/09/2012)  . Seizure disorder     questionable history of - will need to clarify with PCP  . Headache(784.0)     "q other day" (11/09/2012)  . ESRD (end stage renal disease) on dialysis since 2012    "[place]; TTS" (11/09/2012)    INTERIM HISTORY: has Hypertension; Diabetes mellitus without complication; ESRD (end stage renal disease) on dialysis; Seizure disorder; Normocytic anemia; Hyperphosphatemia; Hypocalcemia; Volume overload; Cocaine abuse; Tobacco abuse; GI bleed; Hemorrhage of rectum and anus; Anemia; Heme + stool; and Malnutrition of moderate degree on his problem list.    ALLERGIES:  is allergic to reglan.  MEDICATIONS: has a current medication list which includes the following prescription(s): calcium acetate, ethyl chloride, folic acid, hydralazine, levetiracetam, multivitamin with minerals, and pantoprazole.  SURGICAL HISTORY:  Past Surgical History  Procedure Laterality Date  . Shoulder open rotator cuff repair Right   . Total knee arthroplasty Left   . Bascilic vein transposition  03/07/2012    Procedure: BASCILIC VEIN TRANSPOSITION;  Surgeon: Fransisco Hertz, MD;  Location[facility] OR;  Service: Vascular;  Laterality: Left;  First Stage  . Insertion of dialysis catheter      right chest  . Bascilic vein transposition Left 05/31/2012    Procedure: BASCILIC VEIN TRANSPOSITION;  Surgeon: Fransisco Hertz, MD;  Location: [facility] OR;  Service: Vascular;  Laterality: Left;  Left 2nd Stage Basilic Vein Transposition with gortex graft revision using 40mmx10cm graft   Family History  Problem Relation Age of Onset  . Diabetes Mother   . Hypertension Mother   . Stroke Mother   . Kidney failure Mother   . Cancer Father    REVIEW OF SYSTEMS:   Constitutional: Denies fevers, chills or abnormal weight loss Eyes: Denies blurriness of vision Ears, nose, mouth, throat, and  face: Denies mucositis or sore throat Respiratory: Denies cough, dyspnea or wheezes Cardiovascular: Denies palpitation, chest discomfort or lower extremity swelling Gastrointestinal:  Denies nausea, heartburn or change in bowel habits Skin: Denies abnormal skin rashes Lymphatics: Denies new lymphadenopathy or easy bruising Neurological:Denies numbness, tingling or new weaknesses Behavioral/Psych: Mood is a bit down due to his co-morbidities as noted in HPI, no new changes  All other systems were reviewed with the patient and are negative.  PHYSICAL EXAMINATION: ECOG PERFORMANCE STATUS: 0 - Asymptomatic  Blood pressure 163/76, pulse 79, temperature 97.9 F (36.6 C), temperature source Oral, resp. rate 20, height 5\' 9"  (1.753 m), weight 137 lb 14.4 oz (62.551 kg).  GENERAL:alert, no distress and comfortable; thin male who appears older than his stated age.  SKIN: skin color, texture, turgor are normal, no rashes or significant lesions EYES: normal, Conjunctiva are pink and non-injected, sclera clear OROPHARYNX:no exudate, no erythema and lips, buccal mucosa, and tongue normal; poor dentition w multiple dental caries.  NECK: supple, thyroid normal size, non-tender, without nodularity LYMPH:  no palpable lymphadenopathy in the cervical, axillary or supraclavicular LUNGS: clear to auscultation and percussion with normal breathing effort HEART: regular rate & rhythm and  no murmurs and no lower extremity edema ABDOMEN:abdomen soft, non-tender and normal bowel sounds Musculoskeletal:no cyanosis of digits and no clubbing; Left AV-fistula with good thrill. Chronic venous changes of lower extremities bilaterally.  NEURO: alert & oriented x 3 with fluent speech, no focal motor/sensory deficits  Labs:  Lab Results  Component Value Date   WBC 5.6 01/09/2013   HGB 9.8* 01/09/2013   HCT 28.6* 01/09/2013   MCV 87.7 01/09/2013   PLT 163 01/09/2013   NEUTROABS 3.9 01/02/2013      Chemistry       Component Value Date/Time   NA 132* 01/09/2013 0500   K 5.6* 01/09/2013 0500   CL 95* 01/09/2013 0500   CO2 26 01/09/2013 0500   BUN 95* 01/09/2013 0500   CREATININE 8.49* 01/09/2013 0500      Component Value Date/Time   CALCIUM 8.0* 01/09/2013 0500   ALKPHOS 34* 01/02/2013 0950   AST 19 01/02/2013 0950   ALT 12 01/02/2013 0950   BILITOT 0.2* 01/02/2013 0950      Results for ERICK, MURIN (MRN [id number]) as of 01/30/2013 16:19  Ref. Range 01/08/2013 15:25 01/09/2013 01:15  Fecal Occult Blood, POC Latest Range: NEGATIVE   POSITIVE (A)   Results for PORTER, MOES (MRN [id number]) as of 01/30/2013 16:19  Ref. Range 01/06/2013 08:55  Iron Latest Range: 42-135 ug/dL 32 (L)  UIBC Latest Range: 125-400 ug/dL 086  TIBC Latest Range: 215-435 ug/dL 578  Saturation Ratios Latest Range: 20-55 % 15 (L)  Ferritin Latest Range: 22-322 ng/mL 108   RADIOGRAPHIC STUDIES: Dg Chest 2 View  01/02/2013   *RADIOLOGY REPORT*  Clinical Data: Weakness  CHEST - 2 VIEW  Comparison: 12/19/2012  Findings: The cardiac shadow is stable.  Some left basilar atelectasis is noted new from prior study.  The lungs are otherwise clear.  Mild hyperinflation is again noted.  No acute bony abnormality is seen.  IMPRESSION: New mild atelectasis in the left base.  No focal infiltrate is seen.   Original Report Authenticated By: Alcide Clever, M.D.   Ct Biopsy  01/05/2013   CLINICAL DATA:  Anemia  EXAM: CT-GUIDED bone marrow biopsy and core.  MEDICATIONS AND MEDICAL HISTORY: Versed 2.0 mg, Fentanyl 100 mcg.  Additional Medications: None.  ANESTHESIA/SEDATION: Moderate sedation time: 10 minutes  PROCEDURE: The procedure, risks, benefits, and alternatives were explained to the patient. Questions regarding the procedure were encouraged and answered. The patient understands and consents to the procedure.  The back was prepped with Betadine in a sterile fashion, and a sterile drape was applied covering the operative field. A sterile gown and  sterile gloves were used for the procedure.  Under CT guidance, a(n) 11 gauge guide needle was advanced into the right iliac bone via posterior approach. Aspirates were obtained. A core was obtained. Final imaging was performed.  Patient tolerated the procedure well without complication. Vital sign monitoring by nursing staff during the procedure will continue as patient is in the special procedures unit for post procedure observation.  FINDINGS: The images document guide needle placement within the right iliac bone via posterior approach. Post biopsy images demonstrate no hemorrhage.  IMPRESSION: Successful CT-guided bone marrow aspirate and core.   Electronically Signed   By: Maryclare Bean M.D.   On: 01/05/2013 15:28   PATHOLOGY: NORMOCELLULAR BONE MARROW FOR AGE WITH ATYPICAL LYMPHOID AGGREGATES. - TRILINEAGE HEMATOPOIESIS. - SEE COMMENT PERIPHERAL BLOOD: - NORMOCYTIC-NORMOCHROMIC ANEMIA. Diagnosis [name] The bone marrow shows several variably sized interstitial and paratrabecular lymphoid aggregates  mostly composed of small lymphoid cells. Flow cytometric analysis failed to show any significant changes likely due to sampling. Immunohistochemical stains performed on core biopsy show that the lymphoid aggregates are a mixture of T and B cells but with a significant B cell component. B cells in particular lack CD5, CD10, or cyclin D1 expression. The overall histologic and phenotypic features are worrisome for involvement by a B cell lymphoproliferative process. Full hematologic evaluation is recommended  FLOW CYTOMETRY: - PREDOMINANCE OF T CELLS WITH NO ABNORMAL PHENOTYPE. - MINOR B CELL POPULATION PRESENT. - SEE [name]. Diagnosis Comment: There is a minor B cell population present (14% of lymphocytes) expressing pan B cell antigens but with extremely dim/negative staining for surface immunoglobulin light chains that hinder assessment and/or quantitation of clonality. However, an abnormal B cell  phenotype such as co-expression of CD5 and CD20 or CD10 expression is not identified. Clinical correlation is recommended. (BNS:gt, 01/09/13) [name] Bruin MD SS AND MICROSCOPIC INFORMATION ASSESSMENT: [name] 57 y.o. male with a history of Normocytic anemia - Plan: CBC with Differential, Comprehensive metabolic panel  ESRD (end stage renal disease) on dialysis - Plan: CBC with Differential, Comprehensive metabolic panel  Anemia - Plan: CBC with Differential, Comprehensive metabolic panel   PLAN:  1. Minor B cell population (14% of lymphocytes)  --Clinically the patient has been absent of constitutional symptoms for lymphoma without lymphadenopathy on physical exam.  His white blood cell count is 5.6 with normal plalets.  At this point, given his more active co-morbidities and history of non-compliance, it might be reasonable to actively observe closely for now.  If counts continue to decrease or his develop physical exam findings, we may require an additional bone marrow biopsy.  2. Anemia of Chronic disease. --Likely secondary to his ESRDz.   His hemoglobin of 9.8 improved from 3.8 on admission. He has a history of non-compliance with dialysis.  Continue present HD at [facility] on T/Thurs/Sat. He was on aranesp with dialysis, 200/week.  He was also given IV Fe load with HD on the day of discharge (9/23).  Planned for 10 doses, then weekly.    --Patient does however have a positive FOBT which may need repeat GI evaluation.  [name] on protonix 40 mg bid.    3. ESRDz on dialysis.    --As noted above, he will continue his dialysis.  He has a history of non-compliance.    4.  Polysubstance abuse including crack cocaine, tobacco abuse . --I discussed the importance of abstinence and offered assistance through social support programs, tobacco cessation abuse counseling and the patient declined at this time.     5. Follow-up. --Patient instructed to follow up with [place] for repeat CBC,  Chemistries in 2-4 weeks.   All questions were answered. The patient knows to call the clinic with any problems, questions or concerns. We can certainly see the patient much sooner if necessary. He was provided an after visit summary.   I spent 15 minutes counseling the patient face to face. The total time spent in the appointment was 25 minutes.    Osa Campoli, MD 01/30/2013 4:39 PM

## 2013-01-29 NOTE — Telephone Encounter (Signed)
gv and printed appt sched and avs for pt for OCT. °

## 2013-01-30 ENCOUNTER — Encounter: Payer: Self-pay | Admitting: Internal Medicine

## 2013-02-09 ENCOUNTER — Ambulatory Visit: Payer: Medicare Other | Admitting: Internal Medicine

## 2013-02-12 ENCOUNTER — Telehealth: Payer: Self-pay | Admitting: Internal Medicine

## 2013-02-12 ENCOUNTER — Other Ambulatory Visit (HOSPITAL_BASED_OUTPATIENT_CLINIC_OR_DEPARTMENT_OTHER): Payer: Medicare Other | Admitting: Lab

## 2013-02-12 ENCOUNTER — Ambulatory Visit (HOSPITAL_BASED_OUTPATIENT_CLINIC_OR_DEPARTMENT_OTHER): Payer: Medicare Other | Admitting: Internal Medicine

## 2013-02-12 ENCOUNTER — Telehealth: Payer: Self-pay

## 2013-02-12 VITALS — BP 164/76 | HR 80 | Temp 97.6°F | Resp 18 | Ht 69.0 in | Wt 132.9 lb

## 2013-02-12 DIAGNOSIS — F1410 Cocaine abuse, uncomplicated: Secondary | ICD-10-CM

## 2013-02-12 DIAGNOSIS — D649 Anemia, unspecified: Secondary | ICD-10-CM

## 2013-02-12 DIAGNOSIS — N186 End stage renal disease: Secondary | ICD-10-CM

## 2013-02-12 DIAGNOSIS — F17200 Nicotine dependence, unspecified, uncomplicated: Secondary | ICD-10-CM

## 2013-02-12 DIAGNOSIS — D638 Anemia in other chronic diseases classified elsewhere: Secondary | ICD-10-CM

## 2013-02-12 DIAGNOSIS — Z720 Tobacco use: Secondary | ICD-10-CM

## 2013-02-12 LAB — CBC WITH DIFFERENTIAL/PLATELET
BASO%: 1.2 % (ref 0.0–2.0)
Basophils Absolute: 0.1 10*3/uL (ref 0.0–0.1)
EOS%: 5.5 % (ref 0.0–7.0)
HCT: 27.5 % — ABNORMAL LOW (ref 38.4–49.9)
HGB: 8.9 g/dL — ABNORMAL LOW (ref 13.0–17.1)
LYMPH%: 14.3 % (ref 14.0–49.0)
MCH: 29.5 pg (ref 27.2–33.4)
MCHC: 32.2 g/dL (ref 32.0–36.0)
MCV: 91.7 fL (ref 79.3–98.0)
NEUT%: 70.4 % (ref 39.0–75.0)
Platelets: 283 10*3/uL (ref 140–400)
RBC: 3 10*6/uL — ABNORMAL LOW (ref 4.20–5.82)
lymph#: 1 10*3/uL (ref 0.9–3.3)

## 2013-02-12 LAB — COMPREHENSIVE METABOLIC PANEL (CC13)
ALT: 10 U/L (ref 0–55)
AST: 18 U/L (ref 5–34)
Anion Gap: 13 mEq/L — ABNORMAL HIGH (ref 3–11)
BUN: 32.8 mg/dL — ABNORMAL HIGH (ref 7.0–26.0)
CO2: 29 mEq/L (ref 22–29)
Calcium: 8.8 mg/dL (ref 8.4–10.4)
Chloride: 98 mEq/L (ref 98–109)
Creatinine: 9.2 mg/dL (ref 0.7–1.3)
Potassium: 4.3 mEq/L (ref 3.5–5.1)
Total Bilirubin: 0.34 mg/dL (ref 0.20–1.20)

## 2013-02-12 NOTE — Telephone Encounter (Signed)
gv and printed appt sched and ava for pt for Jan 2015....MD already lvm with SW

## 2013-02-12 NOTE — Patient Instructions (Signed)
1. Please call and speak with lauren mullis [phone].   2. RTC in 2 months for repeat labs including CBC/CMP.

## 2013-02-12 NOTE — Telephone Encounter (Signed)
Labs from today faxed to dialysis center

## 2013-02-13 NOTE — Progress Notes (Signed)
[facility] OFFICE PROGRESS NOTE  MATTINGLY,MICHAEL T, MD [address] [place] [address]  DIAGNOSIS: Anemia - Plan: CBC with Differential in 2 months, Comprehensive metabolic panel  Tobacco abuse  Cocaine abuse  ESRD (end stage renal disease) on dialysis  Normocytic anemia  Chief Complaint  Patient presents with  . Normocytic anemia    CURRENT THERAPY: Observation.   INTERVAL HISTORY: [name] 57 y.o. male with a history of polysubstance abuse including crack cocaine and tobacco, ESRDz on hemodialysis (Tues,Thursday, Saturday) and hypertension, anemia of chronic disease, and diabetes mellitus is here for follow-up.  He was recently seen by me on 01/29/2013. For details regarding his prior history please refer to this note. He continues to admit to recreational crack cocaine use with his last use several days ago. He denies any constitutional symptoms such as fevers or chills. However he does report continued weight loss. He also reports feeling depressed about his multiple co-morbidities being a factor in his relapse with substance abuse.  His last dialysis was on this past Saturday. He reports wanting help for his social conditions including his housing situation.  His nearest relative is his brother who lives in [place], [place].   MEDICAL HISTORY: Past Medical History  Diagnosis Date  . Hypertension   . Anemia, chronic disease   . CHF (congestive heart failure)     diastolic.  EF 60 - 65% per [facility] eco 11/2011  . Cocaine abuse     mentioned in notes from [place]  . Hepatitis C antibody test positive     was HIV negative, 02/28/12  . Hepatitis B core antibody positive     03/01/10  . Positive QuantiFERON-TB Gold test     11/2011  . Helicobacter pylori gastritis     not defined if this was treated  . Polyp of colon, adenomatous     May 2012.  Dr [name] in [place]  . Shortness of breath   . Hematochezia   . Head injury, closed, with  concussion   . History of blood transfusion     "last one was 2 days ago" (11/09/2012)  . Type II diabetes mellitus     on oral pills only  . Arthritis     "right shoulder" (11/09/2012)  . Seizure disorder     questionable history of - will need to clarify with PCP  . Headache(784.0)     "q other day" (11/09/2012)  . ESRD (end stage renal disease) on dialysis since 2012    "[place]; TTS" (11/09/2012)    INTERIM HISTORY: has Hypertension; Diabetes mellitus without complication; ESRD (end stage renal disease) on dialysis; Seizure disorder; Normocytic anemia; Hyperphosphatemia; Hypocalcemia; Volume overload; Cocaine abuse; Tobacco abuse; GI bleed; Hemorrhage of rectum and anus; Anemia; Heme + stool; and Malnutrition of moderate degree on his problem list.    ALLERGIES:  is allergic to reglan.  MEDICATIONS: has a current medication list which includes the following prescription(s): calcium acetate, ethyl chloride, folic acid, hydralazine, levetiracetam, multivitamin with minerals, and pantoprazole.  SURGICAL HISTORY:  Past Surgical History  Procedure Laterality Date  . Shoulder open rotator cuff repair Right   . Total knee arthroplasty Left   . Bascilic vein transposition  03/07/2012    Procedure: BASCILIC VEIN TRANSPOSITION;  Surgeon: Fransisco Hertz, MD;  Location: [facility] OR;  Service: Vascular;  Laterality: Left;  First Stage  . Insertion of dialysis catheter      right chest  . Bascilic vein transposition  Left 05/31/2012    Procedure: BASCILIC VEIN TRANSPOSITION;  Surgeon: Fransisco Hertz, MD;  Location: [facility] OR;  Service: Vascular;  Laterality: Left;  Left 2nd Stage Basilic Vein Transposition with gortex graft revision using 23mmx10cm graft   Family History  Problem Relation Age of Onset  . Diabetes Mother   . Hypertension Mother   . Stroke Mother   . Kidney failure Mother   . Cancer Father    REVIEW OF SYSTEMS:   Constitutional: Denies fevers, chills or abnormal weight loss Eyes: Denies  blurriness of vision Ears, nose, mouth, throat, and face: Denies mucositis or sore throat Respiratory: Denies cough, dyspnea or wheezes Cardiovascular: Denies palpitation, chest discomfort or lower extremity swelling Gastrointestinal:  Denies nausea, heartburn or change in bowel habits Skin: Denies abnormal skin rashes Lymphatics: Denies new lymphadenopathy or easy bruising Neurological:Denies numbness, tingling or new weaknesses Behavioral/Psych: Mood is a bit down due to his co-morbidities as noted in HPI, no new changes  All other systems were reviewed with the patient and are negative.  PHYSICAL EXAMINATION: ECOG PERFORMANCE STATUS: 0 - Asymptomatic  Blood pressure 164/76, pulse 80, temperature 97.6 F (36.4 C), temperature source Oral, resp. rate 18, height 5\' 9"  (1.753 m), weight 132 lb 14.4 oz (60.283 kg).  GENERAL:alert, no distress and comfortable; thin male who appears older than his stated age.  SKIN: skin color, texture, turgor are normal, no rashes or significant lesions EYES: normal, Conjunctiva are pink and non-injected, sclera clear OROPHARYNX:no exudate, no erythema and lips, buccal mucosa, and tongue normal; poor dentition w multiple dental caries.  NECK: supple, thyroid normal size, non-tender, without nodularity LYMPH:  no palpable lymphadenopathy in the cervical, axillary or supraclavicular LUNGS: clear to auscultation and percussion with normal breathing effort HEART: regular rate & rhythm and no murmurs and no lower extremity edema ABDOMEN:abdomen soft, non-tender and normal bowel sounds Musculoskeletal:no cyanosis of digits and no clubbing; Left upper extremity AV-fistula with good thrill. Chronic venous changes of lower extremities bilaterally.  NEURO: alert & oriented x 3 with fluent speech, no focal motor/sensory deficits  Labs:  Lab Results  Component Value Date   WBC 6.8 02/12/2013   HGB 8.9* 02/12/2013   HCT 27.5* 02/12/2013   MCV 91.7 02/12/2013    PLT 283 02/12/2013   NEUTROABS 4.7 02/12/2013      Chemistry      Component Value Date/Time   NA 140 02/12/2013 0941   NA 132* 01/09/2013 0500   K 4.3 02/12/2013 0941   K 5.6* 01/09/2013 0500   CL 95* 01/09/2013 0500   CO2 29 02/12/2013 0941   CO2 26 01/09/2013 0500   BUN 32.8* 02/12/2013 0941   BUN 95* 01/09/2013 0500   CREATININE 9.2* 02/12/2013 0941   CREATININE 8.49* 01/09/2013 0500      Component Value Date/Time   CALCIUM 8.8 02/12/2013 0941   CALCIUM 8.0* 01/09/2013 0500   ALKPHOS 45 02/12/2013 0941   ALKPHOS 34* 01/02/2013 0950   AST 18 02/12/2013 0941   AST 19 01/02/2013 0950   ALT 10 02/12/2013 0941   ALT 12 01/02/2013 0950   BILITOT 0.34 02/12/2013 0941   BILITOT 0.2* 01/02/2013 0950      Results for RAPHEAL, MASSO (MRN [id number]) as of 01/30/2013 16:19  Ref. Range 01/08/2013 15:25 01/09/2013 01:15  Fecal Occult Blood, POC Latest Range: NEGATIVE   POSITIVE (A)   Results for JOSHAUA, EPPLE (MRN [id number]) as of 01/30/2013 16:19  Ref. Range 01/06/2013 08:55  Iron Latest Range: 42-135  ug/dL 32 (L)  UIBC Latest Range: 125-400 ug/dL 782  TIBC Latest Range: 215-435 ug/dL 956  Saturation Ratios Latest Range: 20-55 % 15 (L)  Ferritin Latest Range: 22-322 ng/mL 108   RADIOGRAPHIC STUDIES: Dg Chest 2 View  01/02/2013   *RADIOLOGY REPORT*  Clinical Data: Weakness  CHEST - 2 VIEW  Comparison: 12/19/2012  Findings: The cardiac shadow is stable.  Some left basilar atelectasis is noted new from prior study.  The lungs are otherwise clear.  Mild hyperinflation is again noted.  No acute bony abnormality is seen.  IMPRESSION: New mild atelectasis in the left base.  No focal infiltrate is seen.   Original Report Authenticated By: Alcide Clever, M.D.   Ct Biopsy  01/05/2013   CLINICAL DATA:  Anemia  EXAM: CT-GUIDED bone marrow biopsy and core.  MEDICATIONS AND MEDICAL HISTORY: Versed 2.0 mg, Fentanyl 100 mcg.  Additional Medications: None.  ANESTHESIA/SEDATION: Moderate sedation time: 10  minutes  PROCEDURE: The procedure, risks, benefits, and alternatives were explained to the patient. Questions regarding the procedure were encouraged and answered. The patient understands and consents to the procedure.  The back was prepped with Betadine in a sterile fashion, and a sterile drape was applied covering the operative field. A sterile gown and sterile gloves were used for the procedure.  Under CT guidance, a(n) 11 gauge guide needle was advanced into the right iliac bone via posterior approach. Aspirates were obtained. A core was obtained. Final imaging was performed.  Patient tolerated the procedure well without complication. Vital sign monitoring by nursing staff during the procedure will continue as patient is in the special procedures unit for post procedure observation.  FINDINGS: The images document guide needle placement within the right iliac bone via posterior approach. Post biopsy images demonstrate no hemorrhage.  IMPRESSION: Successful CT-guided bone marrow aspirate and core.   Electronically Signed   By: Maryclare Bean M.D.   On: 01/05/2013 15:28   PATHOLOGY: NORMOCELLULAR BONE MARROW FOR AGE WITH ATYPICAL LYMPHOID AGGREGATES. - TRILINEAGE HEMATOPOIESIS. - SEE COMMENT PERIPHERAL BLOOD: - NORMOCYTIC-NORMOCHROMIC ANEMIA. Diagnosis Note The bone marrow shows several variably sized interstitial and paratrabecular lymphoid aggregates mostly composed of small lymphoid cells. Flow cytometric analysis failed to show any significant changes likely due to sampling. Immunohistochemical stains performed on core biopsy show that the lymphoid aggregates are a mixture of T and B cells but with a significant B cell component. B cells in particular lack CD5, CD10, or cyclin D1 expression. The overall histologic and phenotypic features are worrisome for involvement by a B cell lymphoproliferative process. Full hematologic evaluation is recommended  FLOW CYTOMETRY: - PREDOMINANCE OF T CELLS WITH NO  ABNORMAL PHENOTYPE. - MINOR B CELL POPULATION PRESENT. - SEE NOTE. Diagnosis Comment: There is a minor B cell population present (14% of lymphocytes) expressing pan B cell antigens but with extremely dim/negative staining for surface immunoglobulin light chains that hinder assessment and/or quantitation of clonality. However, an abnormal B cell phenotype such as co-expression of CD5 and CD20 or CD10 expression is not identified. Clinical correlation is recommended. (BNS:gt, 01/09/13) Guerry Bruin MD SS AND MICROSCOPIC INFORMATION ASSESSMENT: [name] 57 y.o. male with a history of Anemia - Plan: CBC with Differential in 2 months, Comprehensive metabolic panel  Tobacco abuse  Cocaine abuse  ESRD (end stage renal disease) on dialysis  Normocytic anemia   PLAN:  1. Minor B cell population (14% of lymphocytes)  --We counseled the patient that minor B-cell population noted above does not constitute  lymphoma. The patient was in the thought process that this was cancer and he stated that he did not want treatment should he be required to have cancer. I reassured the patient that clinically he is without signs of lymphoma such as night sweats and or lymph node enlargement.   His white blood cell count is 5.6 with normal plalets.  At this point, given his more active co-morbidities and history of non-compliance, it might be reasonable to actively observe closely for now.  If counts continue to decrease or his develop physical exam findings, we may require an additional bone marrow biopsy.  2. Anemia of Chronic disease. --Likely secondary to his ESRDz.   His hemoglobin of 8.9 today.  He has a history of non-compliance with dialysis.  Continue present HD at [facility] on T/Thurs/Sat. He was on aranesp with dialysis, 200/week.  He was also given IV Fe load with HD on the day of discharge (9/23).  Planned for 10 doses, then weekly.    --Patient does however have a positive FOBT which may need repeat  GI evaluation.  [name] on protonix 40 mg bid.    3. ESRDz on dialysis.    --As noted above, he will continue his dialysis.  He has a history of non-compliance.    4.  Polysubstance abuse including crack cocaine, tobacco abuse . --I discussed the importance of abstinence and offered assistance through social support programs, tobacco cessation abuse counseling and the patient declined smoking cessation but he was agreeable for consultation with social work Kathrin Penner at [phone]) regarding his frequent crack cocaine use.   He wanted to know what programs were available should she desire to quit. In addition, he's been having problems with housing. Her contact information was made available to him.  5. Follow-up. --Patient instructed to follow up with [place] for repeat CBC, Chemistries in 2 months.   All questions were answered. The patient knows to call the clinic with any problems, questions or concerns. We can certainly see the patient much sooner if necessary. He was provided an after visit summary.   I spent 15 minutes counseling the patient face to face. The total time spent in the appointment was 25 minutes.    Ricquel Foulk, MD 02/13/2013 1:31 PM

## 2013-02-14 ENCOUNTER — Encounter: Payer: Self-pay | Admitting: *Deleted

## 2013-02-14 NOTE — Progress Notes (Signed)
[facility] Clinical Social Work  Clinical Social Work was referred by RN/MD for assessment of psychosocial needs.  Clinical Social Worker contacted patient by phone to offer support and assess for needs.  [name] reports feeling well and has no concerns at this time.  CSW discussed possible substance abuse programs and other options for support. [name] was interested in programs and requested "time to think about it".  Patient agrees to contact CSW if he would like further guidance.    Clinical Social Work interventions: no, awaiting call from patient.  Kathrin Penner, MSW, LCSW Clinical Social Worker [facility] [phone]

## 2013-04-25 ENCOUNTER — Other Ambulatory Visit: Payer: Self-pay

## 2013-04-25 ENCOUNTER — Other Ambulatory Visit: Payer: Medicare Other

## 2013-04-25 ENCOUNTER — Ambulatory Visit: Payer: Medicare Other

## 2013-04-26 ENCOUNTER — Telehealth: Payer: Self-pay | Admitting: Internal Medicine

## 2013-04-26 NOTE — Telephone Encounter (Signed)
LVMM adv of appts on 01/14 per 01/08 POF Home phone dialed but someone answered and stated wrong number shh

## 2013-05-02 ENCOUNTER — Other Ambulatory Visit: Payer: Medicare Other

## 2013-05-02 ENCOUNTER — Other Ambulatory Visit: Payer: Self-pay

## 2013-05-02 ENCOUNTER — Ambulatory Visit: Payer: Medicare Other

## 2013-05-03 ENCOUNTER — Telehealth: Payer: Self-pay | Admitting: Internal Medicine

## 2013-05-03 ENCOUNTER — Encounter: Payer: Self-pay | Admitting: Internal Medicine

## 2013-05-03 NOTE — Telephone Encounter (Signed)
lmonvm advising the pt of his r/s appts that he nissed on 05/02/2013. appts have been r/s to 05/08/2013@9 :00am.

## 2013-05-08 ENCOUNTER — Ambulatory Visit: Payer: Medicare Other

## 2013-05-08 ENCOUNTER — Other Ambulatory Visit: Payer: Medicare Other

## 2013-05-28 ENCOUNTER — Encounter: Payer: Self-pay | Admitting: Internal Medicine

## 2013-05-28 ENCOUNTER — Ambulatory Visit (INDEPENDENT_AMBULATORY_CARE_PROVIDER_SITE_OTHER): Payer: Medicare Other | Admitting: Internal Medicine

## 2013-05-28 VITALS — BP 162/68 | HR 84 | Ht 69.0 in | Wt 137.2 lb

## 2013-05-28 DIAGNOSIS — Z860100 Personal history of colon polyps, unspecified: Secondary | ICD-10-CM

## 2013-05-28 DIAGNOSIS — R195 Other fecal abnormalities: Secondary | ICD-10-CM

## 2013-05-28 DIAGNOSIS — N186 End stage renal disease: Secondary | ICD-10-CM

## 2013-05-28 DIAGNOSIS — D649 Anemia, unspecified: Secondary | ICD-10-CM

## 2013-05-28 NOTE — Progress Notes (Signed)
HISTORY OF PRESENT ILLNESS:  [name] is a 58 y.o. male with multiple significant medical problems including end-stage renal disease for which she is on hemodialysis, hepatitis C, diabetes, alcohol and drug abuse, and medical noncompliance. [name] known to have chronic anemia. Incomplete hematology evaluation. Prior multiple GI evaluations due to the profound nature of his anemia and intermittent documentation of Hemoccult-positive stool. Prior endoscopic evaluations elsewhere have included EGD in 2011 (gastritis), and 2012 (small hiatal hernia). Colonoscopy in May of 2012 with small tubular adenoma. Finally, push enteroscopy December 2012 which was unremarkable. Seen several times in the hospital for profound anemia. Most recently September 2014. We recommended capsule endoscopy and hematology evaluation. His hospital hemoglobin at that time was about 3.8. He received transfusions. He did not followup. Last Thursday he tells me he was made this appointment by the dialysis center due to anemia. We did solicited outside laboratories for review from the dialysis center. Hemoglobin January 14 was 7.4. Hemoglobin January 16 was 8.5. Hemoglobin January 23 was 9.1. He tells me that he has not had blood transfusion since his hospitalization. Patient's not sure about all of his medications. He does miss dialysis sessions a few times per month, according to him. He denies melena or hematochezia. No abdominal pain.  REVIEW OF SYSTEMS:  All non-GI ROS negative except for fatigue  Past Medical History  Diagnosis Date  . Hypertension   . Anemia, chronic disease   . CHF (congestive heart failure)     diastolic.  EF 60 - 65% per [facility] eco 11/2011  . Cocaine abuse     mentioned in notes from [place]  . Hepatitis C antibody test positive     was HIV negative, 02/28/12  . Hepatitis B core antibody positive     03/01/10  . Positive QuantiFERON-TB Gold test     11/2011  . Helicobacter pylori gastritis    not defined if this was treated  . Polyp of colon, adenomatous     May 2012.  Dr Trenton Founds in [place]  . Shortness of breath   . Hematochezia   . Head injury, closed, with concussion   . History of blood transfusion     "last one was 2 days ago" (11/09/2012)  . Type II diabetes mellitus     on oral pills only  . Arthritis     "right shoulder" (11/09/2012)  . Seizure disorder     questionable history of - will need to clarify with PCP  . Headache(784.0)     "q other day" (11/09/2012)  . ESRD (end stage renal disease) on dialysis since 2012    "[place]; TTS" (11/09/2012)    Past Surgical History  Procedure Laterality Date  . Shoulder open rotator cuff repair Right   . Total knee arthroplasty Left   . Bascilic vein transposition  03/07/2012    Procedure: BASCILIC VEIN TRANSPOSITION;  Surgeon: Conrad Whitney, MD;  Location: [facility];  Service: Vascular;  Laterality: Left;  First Stage  . Insertion of dialysis catheter      right chest  . Bascilic vein transposition Left 05/31/2012    Procedure: [facility];  Surgeon: Conrad Woodstock, MD;  Location: [facility];  Service: Vascular;  Laterality: Left;  Left 2nd Stage Basilic Vein Transposition with gortex graft revision using 54mx10cm graft    Social History CDanen Lapaglia reports that he has been smoking Cigarettes.  He has a 3.75 pack-year smoking history. He has never used smokeless  tobacco. He reports that he drinks alcohol. He reports that he uses illicit drugs ("Crack" cocaine and Marijuana).  family history includes Cancer in his father; Diabetes in his mother; Hypertension in his mother; Kidney failure in his mother; Stroke in his mother.  Allergies  Allergen Reactions  . Reglan [Metoclopramide] Other (See Comments)    Tardive dyskinesia in 11/2011 in [place]: Vital signs: BP 162/68  Pulse 84  Ht _0  (1.753 m)  Wt 137 lb 3.2 oz (62.234 kg)  BMI 20.25 kg/m2 General: Chronically  ill-appearing, thin, no acute distress HEENT: Sclerae are anicteric, conjunctiva pink. Oral mucosa intact Lungs: Clear Heart: Regular Abdomen: soft, nontender, nondistended, no obvious ascites, no peritoneal signs, normal bowel sounds. No organomegaly. Extremities: No edema Psychiatric: alert and oriented x3. Cooperative   ASSESSMENT:  #1. Chronic normocytic anemia. Multifactorial. Prior GI workup to include several endoscopies, enteroscopy, and colonoscopy all within the past few years. Results as above. Cannot explain his profound anemia simply on the basis of GI blood loss. I do think it hematology evaluation, with bone marrow biopsy as previously recommended, might be helpful. In terms of additional GI workup, it may be helpful to assess all of the small bowel, rule out AVMs or other pathology. #2. Small adenoma on colonoscopy 2012   PLAN:  #1. Capsule endoscopy. We will contact patient with the results when available #2. Continue close followup with dialysis center. Make sure you get iron and Epogen regularly. Transfuse as clinically appropriate #3. Consider reevaluation with hematology if no overwhelming abnormalities on capsule study #4. Surveillance colonoscopy around 2017

## 2013-05-28 NOTE — Patient Instructions (Signed)
You have been scheduled for a capsule endoscopy on 06/13/13 @ 8:00 am. Please follow written instructions given to you at your visit today.

## 2013-06-12 ENCOUNTER — Encounter (HOSPITAL_COMMUNITY): Payer: Self-pay | Admitting: Emergency Medicine

## 2013-06-12 ENCOUNTER — Emergency Department (HOSPITAL_COMMUNITY)
Admission: EM | Admit: 2013-06-12 | Discharge: 2013-06-12 | Disposition: A | Discharge status: Home / Self Care | Payer: Medicare Other | Attending: Emergency Medicine | Admitting: Emergency Medicine

## 2013-06-12 DIAGNOSIS — Z8719 Personal history of other diseases of the digestive system: Secondary | ICD-10-CM | POA: Insufficient documentation

## 2013-06-12 DIAGNOSIS — F17200 Nicotine dependence, unspecified, uncomplicated: Secondary | ICD-10-CM | POA: Insufficient documentation

## 2013-06-12 DIAGNOSIS — Z8619 Personal history of other infectious and parasitic diseases: Secondary | ICD-10-CM | POA: Insufficient documentation

## 2013-06-12 DIAGNOSIS — Z8739 Personal history of other diseases of the musculoskeletal system and connective tissue: Secondary | ICD-10-CM | POA: Insufficient documentation

## 2013-06-12 DIAGNOSIS — Z992 Dependence on renal dialysis: Secondary | ICD-10-CM | POA: Insufficient documentation

## 2013-06-12 DIAGNOSIS — G40909 Epilepsy, unspecified, not intractable, without status epilepticus: Secondary | ICD-10-CM | POA: Insufficient documentation

## 2013-06-12 DIAGNOSIS — I509 Heart failure, unspecified: Secondary | ICD-10-CM | POA: Insufficient documentation

## 2013-06-12 DIAGNOSIS — N186 End stage renal disease: Secondary | ICD-10-CM | POA: Insufficient documentation

## 2013-06-12 DIAGNOSIS — Z860100 Personal history of colon polyps, unspecified: Secondary | ICD-10-CM | POA: Insufficient documentation

## 2013-06-12 DIAGNOSIS — D649 Anemia, unspecified: Secondary | ICD-10-CM | POA: Insufficient documentation

## 2013-06-12 DIAGNOSIS — I120 Hypertensive chronic kidney disease with stage 5 chronic kidney disease or end stage renal disease: Secondary | ICD-10-CM | POA: Insufficient documentation

## 2013-06-12 DIAGNOSIS — E119 Type 2 diabetes mellitus without complications: Secondary | ICD-10-CM | POA: Insufficient documentation

## 2013-06-12 DIAGNOSIS — Z79899 Other long term (current) drug therapy: Secondary | ICD-10-CM | POA: Insufficient documentation

## 2013-06-12 DIAGNOSIS — Z87828 Personal history of other (healed) physical injury and trauma: Secondary | ICD-10-CM | POA: Insufficient documentation

## 2013-06-12 HISTORY — DX: Hypercalcemia: E83.52

## 2013-06-12 HISTORY — DX: Hyperkalemia: E87.5

## 2013-06-12 LAB — CBC WITH DIFFERENTIAL/PLATELET
BASOS ABS: 0 10*3/uL (ref 0.0–0.1)
Basophils Relative: 1 % (ref 0–1)
EOS PCT: 2 % (ref 0–5)
Eosinophils Absolute: 0.1 10*3/uL (ref 0.0–0.7)
HEMATOCRIT: 17.7 % — AB (ref 39.0–52.0)
Hemoglobin: 5.5 g/dL — CL (ref 13.0–17.0)
Lymphocytes Relative: 12 % (ref 12–46)
Lymphs Abs: 0.7 10*3/uL (ref 0.7–4.0)
MCH: 26.6 pg (ref 26.0–34.0)
MCHC: 31.1 g/dL (ref 30.0–36.0)
MCV: 85.5 fL (ref 78.0–100.0)
MONO ABS: 0.3 10*3/uL (ref 0.1–1.0)
Monocytes Relative: 6 % (ref 3–12)
NEUTROS ABS: 4.5 10*3/uL (ref 1.7–7.7)
Neutrophils Relative %: 79 % — ABNORMAL HIGH (ref 43–77)
Platelets: 265 10*3/uL (ref 150–400)
RBC: 2.07 MIL/uL — ABNORMAL LOW (ref 4.22–5.81)
RDW: 19.5 % — AB (ref 11.5–15.5)
WBC: 5.6 10*3/uL (ref 4.0–10.5)

## 2013-06-12 LAB — PREPARE RBC (CROSSMATCH)

## 2013-06-12 NOTE — ED Notes (Signed)
[name] via [facility].  Pt completed a full dialysis session today (Tues, Thurs, Sat dialysis days), and the facility called to have him transported here for a blood transfusion.  Last HGB noted in paper work from 06/07/13 was 5.2.  Pt in NAD, ambulatory, and A&O.

## 2013-06-12 NOTE — ED Provider Notes (Signed)
She was transfused per Dr. Roland Earl instructions.  [name] feeling stronger.  He'll be discharged home to followup with his urologist and reported for dialysis, per his normal schedule  Garald Balding, NP 06/12/13 2235

## 2013-06-12 NOTE — ED Provider Notes (Signed)
CSN: [id number]     Arrival date & time 06/12/13  1215 History   First MD Initiated Contact with Patient 06/12/13 1220     Chief Complaint  Patient presents with  . Anemia     (Consider location/radiation/quality/duration/timing/severity/associated sxs/prior Treatment) Patient is a 58 y.o. male presenting with anemia.  Anemia   Pt with history of ESRD on HD as well as anemia has had low Hgb the last few days at dialysis, measured on 2/19 at 5.9. Finished his HD today and then sent to the ED for transfusion. Pt states he has had blood in stool in the past and 'they run that camera down by throat and up my tail and I don't want to go through that again'.   Past Medical History  Diagnosis Date  . Hypertension   . Anemia, chronic disease   . CHF (congestive heart failure)     diastolic.  EF 60 - 65% per [facility] eco 11/2011  . Cocaine abuse     mentioned in notes from [place]  . Hepatitis C antibody test positive     was HIV negative, 02/28/12  . Hepatitis B core antibody positive     03/01/10  . Positive QuantiFERON-TB Gold test     11/2011  . Helicobacter pylori gastritis     not defined if this was treated  . Polyp of colon, adenomatous     May 2012.  Dr Trenton Founds in [place]  . Shortness of breath   . Hematochezia   . Head injury, closed, with concussion   . History of blood transfusion     "last one was 2 days ago" (11/09/2012)  . Type II diabetes mellitus     on oral pills only  . Arthritis     "right shoulder" (11/09/2012)  . Seizure disorder     questionable history of - will need to clarify with PCP  . Headache(784.0)     "q other day" (11/09/2012)  . ESRD (end stage renal disease) on dialysis since 2012    "[place]; TTS" (11/09/2012)  . Hypercalcemia   . Hyperpotassemia    Past Surgical History  Procedure Laterality Date  . Shoulder open rotator cuff repair Right   . Total knee arthroplasty Left   . Bascilic vein transposition  03/07/2012    Procedure:  BASCILIC VEIN TRANSPOSITION;  Surgeon: Conrad Redstone, MD;  Location: [place];  Service: Vascular;  Laterality: Left;  First Stage  . Insertion of dialysis catheter      right chest  . Bascilic vein transposition Left 05/31/2012    Procedure: BASCILIC VEIN TRANSPOSITION;  Surgeon: Conrad Greensburg, MD;  Location: [place];  Service: Vascular;  Laterality: Left;  Left 2nd Stage Basilic Vein Transposition with gortex graft revision using 74mmx10cm graft   Family History  Problem Relation Age of Onset  . Diabetes Mother   . Hypertension Mother   . Stroke Mother   . Kidney failure Mother   . Cancer Father    History  Substance Use Topics  . Smoking status: Current Every Day Smoker -- 0.25 packs/day for 15 years    Types: Cigarettes  . Smokeless tobacco: Never Used  . Alcohol Use: Yes     Comment: 11/09/2012 "been stopped drinking 1-2 yr ago"    Review of Systems All other systems reviewed and are negative except as noted in HPI.     Allergies  Reglan  Home Medications   Current Outpatient Rx  Name  Route  Sig  Dispense  Refill  . calcium acetate (PHOSLO) 667 MG capsule   Oral   Take 2,001 mg by mouth 3 (three) times daily with meals.         Marland Kitchen ethyl chloride spray   Topical   Apply 1 application topically as needed.         . folic acid (FOLVITE) 1 MG tablet   Oral   Take 1 mg by mouth daily.         . hydrALAZINE (APRESOLINE) 100 MG tablet   Oral   Take 100 mg by mouth 2 (two) times daily.         Marland Kitchen levETIRAcetam (KEPPRA) 1000 MG tablet   Oral   Take 1,000 mg by mouth 2 (two) times daily.         . Multiple Vitamins-Minerals (MULTIVITAMIN WITH MINERALS) tablet   Oral   Take 1 tablet by mouth daily.         . pantoprazole (PROTONIX) 40 MG tablet   Oral   Take 40 mg by mouth daily.         . traMADol (ULTRAM) 50 MG tablet   Oral   Take 50 mg by mouth 2 (two) times daily.          BP 202/67  Pulse 64  Temp(Src) 98 F (36.7 C) (Oral)  Resp 18  Ht 5'  9" (1.753 m)  Wt 140 lb (63.504 kg)  BMI 20.67 kg/m2  SpO2 100% Physical Exam  Nursing note and vitals reviewed. Constitutional: [name] oriented to person, place, and time. He appears well-developed and well-nourished.  HENT:  Head: Normocephalic and atraumatic.  Eyes: EOM are normal. Pupils are equal, round, and reactive to light.  Neck: Normal range of motion. Neck supple.  Cardiovascular: Normal rate, normal heart sounds and intact distal pulses.   Pulmonary/Chest: Effort normal and breath sounds normal.  Abdominal: Bowel sounds are normal. He exhibits no distension. There is no tenderness.  Musculoskeletal: Normal range of motion. He exhibits no edema and no tenderness.  Dialysis access RUE with palpable thrill  Neurological: [name] alert and oriented to person, place, and time. He has normal strength. No cranial nerve deficit or sensory deficit.  Skin: Skin is warm and dry. No rash noted.  Psychiatric: He has a normal mood and affect.    ED Course  Procedures (including critical care time) Labs Review Labs Reviewed  CBC WITH DIFFERENTIAL  TYPE AND SCREEN   Imaging Review No results found.  EKG Interpretation   None       MDM   Final diagnoses:  None    Plan transfusion of 2 Units PRBC. No indication for admission. Discussed with Nephro who recommends 3hrs per unit. Also recommended use of Short Stay for future transfusion needs.     Abdi B. Karle Starch, MD 06/15/13 [phone]

## 2013-06-12 NOTE — Discharge Instructions (Signed)
Anemia, Nonspecific Anemia is a condition in which the concentration of red blood cells or hemoglobin in the blood is below normal. Hemoglobin is a substance in red blood cells that carries oxygen to the tissues of the body. Anemia results in not enough oxygen reaching these tissues.  CAUSES  Common causes of anemia include:   Excessive bleeding. Bleeding may be internal or external. This includes excessive bleeding from periods (in women) or from the intestine.   Poor nutrition.   Chronic kidney, thyroid, and liver disease.  Bone marrow disorders that decrease red blood cell production.  Cancer and treatments for cancer.  HIV, AIDS, and their treatments.  Spleen problems that increase red blood cell destruction.  Blood disorders.  Excess destruction of red blood cells due to infection, medicines, and autoimmune disorders. SIGNS AND SYMPTOMS   Minor weakness.   Dizziness.   Headache.  Palpitations.   Shortness of breath, especially with exercise.   Paleness.  Cold sensitivity.  Indigestion.  Nausea.  Difficulty sleeping.  Difficulty concentrating. Symptoms may occur suddenly or they may develop slowly.  DIAGNOSIS  Additional blood tests are often needed. These help your health care provider determine the best treatment. Your health care provider will check your stool for blood and look for other causes of blood loss.  TREATMENT  Treatment varies depending on the cause of the anemia. Treatment can include:   Supplements of iron, vitamin S34, or folic acid.   Hormone medicines.   A blood transfusion. This may be needed if blood loss is severe.   Hospitalization. This may be needed if there is significant continual blood loss.   Dietary changes.  Spleen removal. HOME CARE INSTRUCTIONS Keep all follow-up appointments. It often takes many weeks to correct anemia, and having your health care provider check on your condition and your response to  treatment is very important. SEEK IMMEDIATE MEDICAL CARE IF:   You develop extreme weakness, shortness of breath, or chest pain.   You become dizzy or have trouble concentrating.  You develop heavy vaginal bleeding.   You develop a rash.   You have bloody or black, tarry stools.   You faint.   You vomit up blood.   You vomit repeatedly.   You have abdominal pain.  You have a fever or persistent symptoms for more than 2 3 days.   You have a fever and your symptoms suddenly get worse.   You are dehydrated.  MAKE SURE YOU:  Understand these instructions.  Will watch your condition.  Will get help right away if you are not doing well or get worse. Document Released: 05/13/2004 Document Revised: 12/06/2012 Document Reviewed: 09/29/2012 [facility] Patient Information 2014 [place]. You were transfused with 2 units of blood in the emergency department tonight.  Please followup with your urologist, in the next one to 2, days

## 2013-06-13 LAB — TYPE AND SCREEN
ABO/RH(D): A POS
Antibody Screen: NEGATIVE
Unit division: 0
Unit division: 0

## 2013-06-13 NOTE — ED Provider Notes (Signed)
Medical screening examination/treatment/procedure(s) were conducted as a shared visit with non-physician practitioner(s) and myself.  I personally evaluated the patient during the encounter.  EKG Interpretation   None         Bohden B. Karle Starch, MD 06/13/13 1009

## 2013-06-21 ENCOUNTER — Encounter (HOSPITAL_COMMUNITY): Payer: Self-pay | Admitting: Emergency Medicine

## 2013-06-21 ENCOUNTER — Inpatient Hospital Stay (HOSPITAL_COMMUNITY)
Admission: EM | Admit: 2013-06-21 | Discharge: 2013-06-25 | DRG: 811 | Disposition: A | Discharge status: Home / Self Care | Payer: Medicare Other | Attending: Internal Medicine | Admitting: Internal Medicine

## 2013-06-21 DIAGNOSIS — M899 Disorder of bone, unspecified: Secondary | ICD-10-CM | POA: Diagnosis present

## 2013-06-21 DIAGNOSIS — N2581 Secondary hyperparathyroidism of renal origin: Secondary | ICD-10-CM | POA: Diagnosis present

## 2013-06-21 DIAGNOSIS — E440 Moderate protein-calorie malnutrition: Secondary | ICD-10-CM | POA: Diagnosis present

## 2013-06-21 DIAGNOSIS — G40909 Epilepsy, unspecified, not intractable, without status epilepticus: Secondary | ICD-10-CM | POA: Diagnosis present

## 2013-06-21 DIAGNOSIS — I120 Hypertensive chronic kidney disease with stage 5 chronic kidney disease or end stage renal disease: Secondary | ICD-10-CM | POA: Diagnosis present

## 2013-06-21 DIAGNOSIS — Z91199 Patient's noncompliance with other medical treatment and regimen due to unspecified reason: Secondary | ICD-10-CM

## 2013-06-21 DIAGNOSIS — F1410 Cocaine abuse, uncomplicated: Secondary | ICD-10-CM | POA: Diagnosis present

## 2013-06-21 DIAGNOSIS — E119 Type 2 diabetes mellitus without complications: Secondary | ICD-10-CM | POA: Diagnosis present

## 2013-06-21 DIAGNOSIS — Z9119 Patient's noncompliance with other medical treatment and regimen: Secondary | ICD-10-CM

## 2013-06-21 DIAGNOSIS — K2990 Gastroduodenitis, unspecified, without bleeding: Secondary | ICD-10-CM

## 2013-06-21 DIAGNOSIS — K922 Gastrointestinal hemorrhage, unspecified: Secondary | ICD-10-CM

## 2013-06-21 DIAGNOSIS — Z79899 Other long term (current) drug therapy: Secondary | ICD-10-CM

## 2013-06-21 DIAGNOSIS — IMO0002 Reserved for concepts with insufficient information to code with codable children: Secondary | ICD-10-CM

## 2013-06-21 DIAGNOSIS — Z96659 Presence of unspecified artificial knee joint: Secondary | ICD-10-CM

## 2013-06-21 DIAGNOSIS — Z91158 Patient's noncompliance with renal dialysis for other reason: Secondary | ICD-10-CM

## 2013-06-21 DIAGNOSIS — M949 Disorder of cartilage, unspecified: Secondary | ICD-10-CM

## 2013-06-21 DIAGNOSIS — Z9115 Patient's noncompliance with renal dialysis: Secondary | ICD-10-CM

## 2013-06-21 DIAGNOSIS — K2970 Gastritis, unspecified, without bleeding: Secondary | ICD-10-CM | POA: Diagnosis present

## 2013-06-21 DIAGNOSIS — N186 End stage renal disease: Secondary | ICD-10-CM | POA: Diagnosis present

## 2013-06-21 DIAGNOSIS — R195 Other fecal abnormalities: Secondary | ICD-10-CM | POA: Diagnosis present

## 2013-06-21 DIAGNOSIS — F17200 Nicotine dependence, unspecified, uncomplicated: Secondary | ICD-10-CM | POA: Diagnosis present

## 2013-06-21 DIAGNOSIS — Z720 Tobacco use: Secondary | ICD-10-CM

## 2013-06-21 DIAGNOSIS — Z992 Dependence on renal dialysis: Secondary | ICD-10-CM

## 2013-06-21 DIAGNOSIS — B1920 Unspecified viral hepatitis C without hepatic coma: Secondary | ICD-10-CM | POA: Diagnosis present

## 2013-06-21 DIAGNOSIS — D649 Anemia, unspecified: Secondary | ICD-10-CM | POA: Diagnosis present

## 2013-06-21 DIAGNOSIS — D638 Anemia in other chronic diseases classified elsewhere: Principal | ICD-10-CM | POA: Diagnosis present

## 2013-06-21 DIAGNOSIS — Z841 Family history of disorders of kidney and ureter: Secondary | ICD-10-CM

## 2013-06-21 LAB — CBC WITH DIFFERENTIAL/PLATELET
Basophils Absolute: 0 10*3/uL (ref 0.0–0.1)
Basophils Relative: 1 % (ref 0–1)
EOS ABS: 0.2 10*3/uL (ref 0.0–0.7)
Eosinophils Relative: 5 % (ref 0–5)
HCT: 16.6 % — ABNORMAL LOW (ref 39.0–52.0)
Hemoglobin: 5.3 g/dL — CL (ref 13.0–17.0)
Lymphocytes Relative: 15 % (ref 12–46)
Lymphs Abs: 0.7 10*3/uL (ref 0.7–4.0)
MCH: 27 pg (ref 26.0–34.0)
MCHC: 31.9 g/dL (ref 30.0–36.0)
MCV: 84.7 fL (ref 78.0–100.0)
MONOS PCT: 6 % (ref 3–12)
Monocytes Absolute: 0.3 10*3/uL (ref 0.1–1.0)
Neutro Abs: 3.2 10*3/uL (ref 1.7–7.7)
Neutrophils Relative %: 73 % (ref 43–77)
Platelets: 236 10*3/uL (ref 150–400)
RBC: 1.96 MIL/uL — ABNORMAL LOW (ref 4.22–5.81)
RDW: 18.4 % — ABNORMAL HIGH (ref 11.5–15.5)
WBC: 4.4 10*3/uL (ref 4.0–10.5)

## 2013-06-21 LAB — I-STAT TROPONIN, ED: Troponin i, poc: 0.06 ng/mL (ref 0.00–0.08)

## 2013-06-21 LAB — I-STAT CHEM 8, ED
BUN: 42 mg/dL — AB (ref 6–23)
CREATININE: 4.2 mg/dL — AB (ref 0.50–1.35)
Calcium, Ion: 0.98 mmol/L — ABNORMAL LOW (ref 1.12–1.23)
Chloride: 95 mEq/L — ABNORMAL LOW (ref 96–112)
Glucose, Bld: 111 mg/dL — ABNORMAL HIGH (ref 70–99)
HCT: 17 % — ABNORMAL LOW (ref 39.0–52.0)
HEMOGLOBIN: 5.8 g/dL — AB (ref 13.0–17.0)
POTASSIUM: 3.5 meq/L — AB (ref 3.7–5.3)
Sodium: 139 mEq/L (ref 137–147)
TCO2: 33 mmol/L (ref 0–100)

## 2013-06-21 LAB — LACTATE DEHYDROGENASE: LDH: 267 U/L — ABNORMAL HIGH (ref 94–250)

## 2013-06-21 LAB — BILIRUBIN, TOTAL: Total Bilirubin: 0.4 mg/dL (ref 0.3–1.2)

## 2013-06-21 LAB — SAVE SMEAR

## 2013-06-21 LAB — POC OCCULT BLOOD, ED: FECAL OCCULT BLD: POSITIVE — AB

## 2013-06-21 LAB — PREPARE RBC (CROSSMATCH)

## 2013-06-21 MED ORDER — CALCIUM ACETATE 667 MG PO CAPS: 667.0000 mg | ORAL_CAPSULE | ORAL | Status: DC

## 2013-06-21 MED ORDER — FOLIC ACID 1 MG PO TABS
1.0000 mg | ORAL_TABLET | Freq: Every day | ORAL | Status: DC
  Administered 2013-06-21 – 2013-06-25 (×5): 1 mg via ORAL
  Filled 2013-06-21 (×5): qty 1

## 2013-06-21 MED ORDER — PANTOPRAZOLE SODIUM 40 MG PO TBEC
40.0000 mg | DELAYED_RELEASE_TABLET | Freq: Every day | ORAL | Status: DC
  Administered 2013-06-21 – 2013-06-25 (×5): 40 mg via ORAL
  Filled 2013-06-21 (×5): qty 1

## 2013-06-21 MED ORDER — ONDANSETRON HCL 4 MG PO TABS: 4.0000 mg | ORAL_TABLET | Freq: Four times a day (QID) | ORAL | Status: DC | PRN

## 2013-06-21 MED ORDER — SODIUM CHLORIDE 0.9 % IV SOLN: 250.0000 mL | INTRAVENOUS | Status: DC | PRN

## 2013-06-21 MED ORDER — ONDANSETRON HCL 4 MG/2ML IJ SOLN: 4.0000 mg | Freq: Four times a day (QID) | INTRAMUSCULAR | Status: DC | PRN

## 2013-06-21 MED ORDER — SODIUM CHLORIDE 0.9 % IJ SOLN: 3.0000 mL | INTRAMUSCULAR | Status: DC | PRN

## 2013-06-21 MED ORDER — LEVETIRACETAM 500 MG PO TABS
1000.0000 mg | ORAL_TABLET | Freq: Two times a day (BID) | ORAL | Status: DC
  Administered 2013-06-21 – 2013-06-25 (×8): 1000 mg via ORAL
  Filled 2013-06-21 (×10): qty 2

## 2013-06-21 MED ORDER — SODIUM CHLORIDE 0.9 % IJ SOLN
3.0000 mL | Freq: Two times a day (BID) | INTRAMUSCULAR | Status: DC
  Administered 2013-06-23 – 2013-06-24 (×4): 3 mL via INTRAVENOUS

## 2013-06-21 MED ORDER — SODIUM CHLORIDE 0.9 % IJ SOLN
3.0000 mL | Freq: Two times a day (BID) | INTRAMUSCULAR | Status: DC
  Administered 2013-06-22 – 2013-06-25 (×6): 3 mL via INTRAVENOUS

## 2013-06-21 MED ORDER — ACETAMINOPHEN 325 MG PO TABS
650.0000 mg | ORAL_TABLET | Freq: Four times a day (QID) | ORAL | Status: DC | PRN
  Administered 2013-06-21 – 2013-06-25 (×7): 650 mg via ORAL
  Filled 2013-06-21 (×8): qty 2

## 2013-06-21 NOTE — ED Provider Notes (Signed)
CSN: [id number]     Arrival date & time 06/21/13  1237 History   First MD Initiated Contact with Patient 06/21/13 1243     Chief Complaint  Patient presents with  . Fatigue  . Weakness     (Consider location/radiation/quality/duration/timing/severity/associated sxs/prior Treatment) HPI Comments: Patient presents emergency department with chief complaint of weakness. He comes from dialysis. He states that he was able to complete his dialysis treatment today. He denies any chest pain or shortness of breath. Denies any nausea, vomiting, diarrhea, constipation. He states that he has felt weak and tired for the past couple of days.  He denies any fevers or chills.  Reportedly his hemoglobin is 5.x.  The history is provided by the patient. No language interpreter was used.    Past Medical History  Diagnosis Date  . Hypertension   . Anemia, chronic disease   . CHF (congestive heart failure)     diastolic.  EF 60 - 65% per [facility] eco 11/2011  . Cocaine abuse     mentioned in notes from [place]  . Hepatitis C antibody test positive     was HIV negative, 02/28/12  . Hepatitis B core antibody positive     03/01/10  . Positive QuantiFERON-TB Gold test     11/2011  . Helicobacter pylori gastritis     not defined if this was treated  . Polyp of colon, adenomatous     May 2012.  Dr Trenton Founds in [place]  . Shortness of breath   . Hematochezia   . Head injury, closed, with concussion   . History of blood transfusion     "last one was 2 days ago" (11/09/2012)  . Type II diabetes mellitus     on oral pills only  . Arthritis     "right shoulder" (11/09/2012)  . Seizure disorder     questionable history of - will need to clarify with PCP  . Headache(784.0)     "q other day" (11/09/2012)  . ESRD (end stage renal disease) on dialysis since 2012    "[place]; TTS" (11/09/2012)  . Hypercalcemia   . Hyperpotassemia    Past Surgical History  Procedure Laterality Date  . Shoulder open  rotator cuff repair Right   . Total knee arthroplasty Left   . Bascilic vein transposition  03/07/2012    Procedure: BASCILIC VEIN TRANSPOSITION;  Surgeon: Conrad Mooreland, MD;  Location: [place];  Service: Vascular;  Laterality: Left;  First Stage  . Insertion of dialysis catheter      right chest  . Bascilic vein transposition Left 05/31/2012    Procedure: BASCILIC VEIN TRANSPOSITION;  Surgeon: Conrad Jem[place], MD;  Location: [place];  Service: Vascular;  Laterality: Left;  Left 2nd Stage Basilic Vein Transposition with gortex graft revision using 2mmx10cm graft   Family History  Problem Relation Age of Onset  . Diabetes Mother   . Hypertension Mother   . Stroke Mother   . Kidney failure Mother   . Cancer Father    History  Substance Use Topics  . Smoking status: Current Every Day Smoker -- 0.25 packs/day for 15 years    Types: Cigarettes  . Smokeless tobacco: Never Used  . Alcohol Use: No     Comment: 11/09/2012 "been stopped drinking 1-2 yr ago"    Review of Systems  All other systems reviewed and are negative.      Allergies  Reglan  Home Medications   Current Outpatient Rx  Name  Route  Sig  Dispense  Refill  . calcium acetate (PHOSLO) 667 MG capsule   Oral   Take 667-2,001 mg by mouth See admin instructions. Take 3 capsules (2,001mg ) three times a day with meals and take 1 capsule a day with snacks         . ethyl chloride spray   Topical   Apply 1 application topically as needed.         . folic acid (FOLVITE) 1 MG tablet   Oral   Take 1 mg by mouth daily.         . hydrALAZINE (APRESOLINE) 100 MG tablet   Oral   Take 100 mg by mouth 2 (two) times daily.         Marland Kitchen levETIRAcetam (KEPPRA) 1000 MG tablet   Oral   Take 1,000 mg by mouth 2 (two) times daily.         . Multiple Vitamins-Minerals (MULTIVITAMIN WITH MINERALS) tablet   Oral   Take 1 tablet by mouth daily.         . pantoprazole (PROTONIX) 40 MG tablet   Oral   Take 40 mg by mouth  daily.         . traMADol (ULTRAM) 50 MG tablet   Oral   Take 50 mg by mouth 2 (two) times daily.          BP 159/85  Pulse 79  Temp(Src) 98.2 F (36.8 C) (Oral)  Resp 18  SpO2 91% Physical Exam  Nursing note and vitals reviewed. Constitutional: [name] oriented to person, place, and time. He appears well-developed and well-nourished.  Thin  HENT:  Head: Normocephalic and atraumatic.  Eyes: Conjunctivae and EOM are normal. Pupils are equal, round, and reactive to light. Right eye exhibits no discharge. Left eye exhibits no discharge. No scleral icterus.  Neck: Normal range of motion. Neck supple. No JVD present.  Cardiovascular: Normal rate, regular rhythm and normal heart sounds.  Exam reveals no gallop and no friction rub.   No murmur heard. Palpable thrill at the left arm  Pulmonary/Chest: Effort normal and breath sounds normal. No respiratory distress. He has no wheezes. He has no rales. He exhibits no tenderness.  Abdominal: Soft. He exhibits no distension and no mass. There is no tenderness. There is no rebound and no guarding.  Musculoskeletal: Normal range of motion. He exhibits no edema and no tenderness.  Neurological: [name] alert and oriented to person, place, and time.  Skin: Skin is warm and dry.  Psychiatric: He has a normal mood and affect. His behavior is normal. Judgment and thought content normal.    ED Course  Procedures (including critical care time) Results for orders placed during the hospital encounter of 06/21/13  CBC WITH DIFFERENTIAL      Result Value Ref Range   WBC 4.4  4.0 - 10.5 K/uL   RBC 1.96 (*) 4.22 - 5.81 MIL/uL   Hemoglobin 5.3 (*) 13.0 - 17.0 g/dL   HCT 16.6 (*) 39.0 - 52.0 %   MCV 84.7  78.0 - 100.0 fL   MCH 27.0  26.0 - 34.0 pg   MCHC 31.9  30.0 - 36.0 g/dL   RDW 18.4 (*) 11.5 - 15.5 %   Platelets 236  150 - 400 K/uL   Neutrophils Relative % 73  43 - 77 %   Neutro Abs 3.2  1.7 - 7.7 K/uL   Lymphocytes Relative 15  12 - 46 %  Lymphs Abs 0.7  0.7 - 4.0 K/uL   Monocytes Relative 6  3 - 12 %   Monocytes Absolute 0.3  0.1 - 1.0 K/uL   Eosinophils Relative 5  0 - 5 %   Eosinophils Absolute 0.2  0.0 - 0.7 K/uL   Basophils Relative 1  0 - 1 %   Basophils Absolute 0.0  0.0 - 0.1 K/uL  I-STAT CHEM 8, ED      Result Value Ref Range   Sodium 139  137 - 147 mEq/L   Potassium 3.5 (*) 3.7 - 5.3 mEq/L   Chloride 95 (*) 96 - 112 mEq/L   BUN 42 (*) 6 - 23 mg/dL   Creatinine, Ser 4.20 (*) 0.50 - 1.35 mg/dL   Glucose, Bld 111 (*) 70 - 99 mg/dL   Calcium, Ion 0.98 (*) 1.12 - 1.23 mmol/L   TCO2 33  0 - 100 mmol/L   Hemoglobin 5.8 (*) 13.0 - 17.0 g/dL   HCT 17.0 (*) 39.0 - 52.0 %   Comment NOTIFIED PHYSICIAN    I-STAT TROPOININ, ED      Result Value Ref Range   Troponin i, poc 0.06  0.00 - 0.08 ng/mL   Comment 3           POC OCCULT BLOOD, ED      Result Value Ref Range   Fecal Occult Bld POSITIVE (*) NEGATIVE  PREPARE RBC (CROSSMATCH)      Result Value Ref Range   Order Confirmation ORDER PROCESSED BY BLOOD BANK      Imaging Review No results found.   EKG Interpretation   Date/Time:  Thursday June 21 2013 12:25:37 EST Ventricular Rate:  80 PR Interval:  178 QRS Duration: 98 QT Interval:  442 QTC Calculation: 509 R Axis:   38 Text Interpretation:  Normal sinus rhythm Possible Left atrial enlargement  Left ventricular hypertrophy with repolarization abnormality Prolonged QT  Abnormal ECG Confirmed by Christy Gentles  MD, Elenore Rota ([id number]) on 06/21/2013  12:38:58 PM      MDM   Final diagnoses:  GI bleed  Anemia    Patient with weakness. [name] from dialysis. His hemoglobin is 5.3. We will transfuse and admit.  CRITICAL CARE Performed by: Montine Circle   Total critical care time: 35  Critical care time was exclusive of separately billable procedures and treating other patients.  Critical care was necessary to treat or prevent imminent or life-threatening deterioration.  Critical care was time spent  personally by me on the following activities: development of treatment plan with patient and/or surrogate as well as nursing, discussions with consultants, evaluation of patient's response to treatment, examination of patient, obtaining history from patient or surrogate, ordering and performing treatments and interventions, ordering and review of laboratory studies, ordering and review of radiographic studies, pulse oximetry and re-evaluation of patient's condition.  2:58 PM Patient discussed with Dr. Eliseo Squires, from [facility], who will admit the patient.  GI is aware, and aware of prior workup.  GI will see the patient tomorrow morning.   Montine Circle, PA-C 06/21/13 1459

## 2013-06-21 NOTE — ED Notes (Signed)
Critical lab results reported to Dr.Wickline.

## 2013-06-21 NOTE — ED Notes (Signed)
Pt coming by EMS from dialysis. Pt received 4 hours of dialysis today. Dialysis reports pt was lethargic and weak afterwards. Pt has h/o low hemoglobin. EMS reports pt lethargic but A&O x4. Pt O2 92-99% in route so pt placed on 2L [place]. Pt denies any pain.

## 2013-06-21 NOTE — H&P (Addendum)
[facility] History and Physical  [name] VHQ:[id number] DOB: May 25, 1955 DOA: 06/21/2013  Referring physician: er PCP: Windy Kalata, MD   Chief Complaint: low hgb  HPI: [name] is a 58 y.o. male  Who is non-complaint with his medical care.  He comes in after dialysis complaining of lethargy and weakness.  He was able to have all 4 hours of dialysis today(t/Th/sat).   In the ER, He was found to have a Hgb of 5- which is not new to him.  He has had chronic anemia and in the past lives around 5 (last ER visit was 06/12/13-given 2 units PRBC and nephro recommended short stay for further transfusions- PA in ER was not comfortable with this plan).   He has had GI work up by Dr. Henrene Pastor including scopes (gastritis,polyp) - next step was to be capsule endoscopy--- saw Dr. Henrene Pastor in 2/15.   Patient was also seen by Dr. Juliann Mule in the fall of 2014- he had a BMB that showed a minor B-cell population.   Patient never followed back up.  Patient denies alcohol but reports that he may have used crack cocaine (smoked) in the past few weeks.   No SOB, no chest pain- just fatigue.  No blood in stools per patient  Review of Systems:  All systems reviewed, negative unless stated above    Past Medical History  Diagnosis Date  . Hypertension   . Anemia, chronic disease   . CHF (congestive heart failure)     diastolic.  EF 60 - 65% per [facility] eco 11/2011  . Cocaine abuse     mentioned in notes from [place]  . Hepatitis C antibody test positive     was HIV negative, 02/28/12  . Hepatitis B core antibody positive     03/01/10  . Positive QuantiFERON-TB Gold test     11/2011  . Helicobacter pylori gastritis     not defined if this was treated  . Polyp of colon, adenomatous     May 2012.  Dr Trenton Founds in [place]  . Shortness of breath   . Hematochezia   . Head injury, closed, with concussion   . History of blood transfusion     "last one was 2 days ago" (11/09/2012)  . Type  II diabetes mellitus     on oral pills only  . Arthritis     "right shoulder" (11/09/2012)  . Seizure disorder     questionable history of - will need to clarify with PCP  . Headache(784.0)     "q other day" (11/09/2012)  . ESRD (end stage renal disease) on dialysis since 2012    "[place]; TTS" (11/09/2012)  . Hypercalcemia   . Hyperpotassemia    Past Surgical History  Procedure Laterality Date  . Shoulder open rotator cuff repair Right   . Total knee arthroplasty Left   . Bascilic vein transposition  03/07/2012    Procedure: BASCILIC VEIN TRANSPOSITION;  Surgeon: Conrad [place], MD;  Location: [place];  Service: Vascular;  Laterality: Left;  First Stage  . Insertion of dialysis catheter      right chest  . Bascilic vein transposition Left 05/31/2012    Procedure: BASCILIC VEIN TRANSPOSITION;  Surgeon: Conrad Grover, MD;  Location: [place];  Service: Vascular;  Laterality: Left;  Left 2nd Stage Basilic Vein Transposition with gortex graft revision using 61mmx10cm graft   Social History:  reports that he has been smoking Cigarettes.  He has a 3.75  pack-year smoking history. He has never used smokeless tobacco. He reports that he does not drink alcohol or use illicit drugs.  Allergies  Allergen Reactions  . Reglan [Metoclopramide] Other (See Comments)    Tardive dyskinesia in 11/2011 in [place]    Family History  Problem Relation Age of Onset  . Diabetes Mother   . Hypertension Mother   . Stroke Mother   . Kidney failure Mother   . Cancer Father      Prior to Admission medications   Medication Sig Start Date End Date Taking? Authorizing Provider  calcium acetate (PHOSLO) 667 MG capsule Take 667-2,001 mg by mouth See admin instructions. Take 3 capsules (2,001mg ) three times a day with meals and take 1 capsule a day with snacks   Yes Historical Provider, MD  folic acid (FOLVITE) 1 MG tablet Take 1 mg by mouth daily.   Yes Historical Provider, MD  hydrALAZINE (APRESOLINE) 100 MG tablet  Take 100 mg by mouth 2 (two) times daily.   Yes Historical Provider, MD  levETIRAcetam (KEPPRA) 1000 MG tablet Take 1,000 mg by mouth 2 (two) times daily.   Yes Historical Provider, MD  pantoprazole (PROTONIX) 40 MG tablet Take 40 mg by mouth daily.   Yes Historical Provider, MD   Physical Exam: Filed Vitals:   06/21/13 1345  BP: 160/67  Pulse: 77  Temp:   Resp:     BP 160/67  Pulse 77  Temp(Src) 98.2 F (36.8 C) (Oral)  Resp 18  SpO2 96%  General:  Appears calm and comfortable- poor eye contact Eyes: PERRL, normal lids, irises & conjunctiva ENT: grossly normal hearing, lips & tongue Neck: no LAD, masses or thyromegaly Cardiovascular: RRR, no m/r/g. No LE edema. Telemetry: SR, no arrhythmias  Respiratory: CTA bilaterally, no w/r/r. Normal respiratory effort. Abdomen: soft, ntnd Skin: no rash or induration seen on limited exam Musculoskeletal: grossly normal tone BUE/BLE Psychiatric: grossly normal mood and affect, speech fluent and appropriate Neurologic: grossly non-focal.          Labs on Admission:  Basic Metabolic Panel:  Recent Labs Lab 06/21/13 1333  NA 139  K 3.5*  CL 95*  GLUCOSE 111*  BUN 42*  CREATININE 4.20*   Liver Function Tests: No results found for this basename: AST, ALT, ALKPHOS, BILITOT, PROT, ALBUMIN,  in the last 168 hours No results found for this basename: LIPASE, AMYLASE,  in the last 168 hours No results found for this basename: AMMONIA,  in the last 168 hours CBC:  Recent Labs Lab 06/21/13 1309 06/21/13 1333  WBC 4.4  --   NEUTROABS 3.2  --   HGB 5.3* 5.8*  HCT 16.6* 17.0*  MCV 84.7  --   PLT 236  --    Cardiac Enzymes: No results found for this basename: CKTOTAL, CKMB, CKMBINDEX, TROPONINI,  in the last 168 hours  BNP (last 3 results) No results found for this basename: PROBNP,  in the last 8760 hours CBG: No results found for this basename: GLUCAP,  in the last 168 hours  Radiological Exams on Admission: No results  found.    Assessment/Plan Active Problems:   ESRD (end stage renal disease) on dialysis   Cocaine abuse   Anemia   Heme + stool   Anemia- chronic- ?etiology ESRD vs heme + stools vs hematologic issue- had BMB that showed minor B-cell population.  Heme onc to see in AM; transfuse 2 units PRBC  cocaine abuse- encourage cessation  ESRD- HD t/th/sat- if  patient remains after tomm, will need nephro consult  Heme + stools- chronic issues- patient has gastritis   Spoke with Dr. Henrene Pastor- nothing from GI standpoint to do in hospital; if patient would allow he would do outpatient capsule endoscopy  Dr. Juliann Mule- will see in AM  Code Status: full Family Communication: patient Disposition Plan: obs-tele- no need for SDU   Time spent: 75 min  Eulogio Bear [facility] Pager [phone]

## 2013-06-22 DIAGNOSIS — K922 Gastrointestinal hemorrhage, unspecified: Secondary | ICD-10-CM

## 2013-06-22 DIAGNOSIS — Z992 Dependence on renal dialysis: Secondary | ICD-10-CM

## 2013-06-22 DIAGNOSIS — D72820 Lymphocytosis (symptomatic): Secondary | ICD-10-CM

## 2013-06-22 DIAGNOSIS — F17200 Nicotine dependence, unspecified, uncomplicated: Secondary | ICD-10-CM

## 2013-06-22 LAB — CBC
HCT: 21.9 % — ABNORMAL LOW (ref 39.0–52.0)
Hemoglobin: 7 g/dL — ABNORMAL LOW (ref 13.0–17.0)
MCH: 28 pg (ref 26.0–34.0)
MCHC: 32 g/dL (ref 30.0–36.0)
MCV: 87.6 fL (ref 78.0–100.0)
Platelets: 244 10*3/uL (ref 150–400)
RBC: 2.5 MIL/uL — AB (ref 4.22–5.81)
RDW: 17.4 % — ABNORMAL HIGH (ref 11.5–15.5)
WBC: 5.6 10*3/uL (ref 4.0–10.5)

## 2013-06-22 LAB — HAPTOGLOBIN: Haptoglobin: 127 mg/dL (ref 45–215)

## 2013-06-22 MED ORDER — HYDRALAZINE HCL 100 MG PO TABS: 100.0000 mg | ORAL_TABLET | Freq: Two times a day (BID) | ORAL | Status: DC

## 2013-06-22 MED ORDER — HYDRALAZINE HCL 50 MG PO TABS
100.0000 mg | ORAL_TABLET | Freq: Two times a day (BID) | ORAL | Status: DC
  Administered 2013-06-22 – 2013-06-25 (×7): 100 mg via ORAL
  Filled 2013-06-22 (×9): qty 2

## 2013-06-22 NOTE — Progress Notes (Addendum)
[facility] PROGRESS NOTE  Leif Loflin RFF:[id number] DOB: 11/13/1955 DOA: 06/21/2013 PCP: Windy Kalata, MD  Assessment/Plan: 1-Anemia- chronic- ?etiology ESRD vs heme + stools vs hematologic issue-  had BMB that showed minor B-cell population. Heme onc to see today.   [name] S/P  2 units PRBC 3-5  Repeat CBC post transfusion ordered.   Heme + stools- chronic issues- patient has gastritis  Dr Osie Bond with Dr Henrene Pastor nothing from GI standpoint to do in hospital; if patient would allow he would do outpatient capsule endoscopy.  Hb at 7, he will need Blood transfusion with dialysis.   Dr Juliann Mule recommend repeat Bone marrow biopsy.   2-cocaine abuse- encourage cessation   3-ESRD- HD t/th/sat- renal consulted.   4-History of Seizure; continue with Keppra.    Code Status: Full Code.  Family Communication: Care discussed with patient.  Disposition Plan: to be determine.    Consultants:  Hematology  Nephrology  Procedures:  none  Antibiotics:  none  HPI/Subjective: Feels sleepy, was not able to sleep last night.  He will need help with transportation at time of discharge.   Objective: Filed Vitals:   06/22/13 0941  BP: 151/65  Pulse: 81  Temp: 99.4 F (37.4 C)  Resp: 19    Intake/Output Summary (Last 24 hours) at 06/22/13 0948 Last data filed at 06/21/13 2340  Gross per 24 hour  Intake   1045 ml  Output      0 ml  Net   1045 ml   Filed Weights   06/21/13 1802  Weight: 61.2 kg (134 lb 14.7 oz)    Exam:   General:  No distress.   Cardiovascular: S 1, S 2 RRR  Respiratory: few crackles.   Abdomen: Bs present, soft.   Musculoskeletal: no edema.   Data Reviewed: Basic Metabolic Panel:  Recent Labs Lab 06/21/13 1333  NA 139  K 3.5*  CL 95*  GLUCOSE 111*  BUN 42*  CREATININE 4.20*   Liver Function Tests:  Recent Labs Lab 06/21/13 1323  BILITOT 0.4   No results found for this basename: LIPASE, AMYLASE,  in the last 168  hours No results found for this basename: AMMONIA,  in the last 168 hours CBC:  Recent Labs Lab 06/21/13 1309 06/21/13 1333  WBC 4.4  --   NEUTROABS 3.2  --   HGB 5.3* 5.8*  HCT 16.6* 17.0*  MCV 84.7  --   PLT 236  --    Cardiac Enzymes: No results found for this basename: CKTOTAL, CKMB, CKMBINDEX, TROPONINI,  in the last 168 hours BNP (last 3 results) No results found for this basename: PROBNP,  in the last 8760 hours CBG: No results found for this basename: GLUCAP,  in the last 168 hours  No results found for this or any previous visit (from the past 240 hour(s)).   Studies: No results found.  Scheduled Meds: . calcium acetate  667-2,001 mg Oral See admin instructions  . folic acid  1 mg Oral Daily  . hydrALAZINE  100 mg Oral BID  . levETIRAcetam  1,000 mg Oral BID  . pantoprazole  40 mg Oral Daily  . sodium chloride  3 mL Intravenous Q12H  . sodium chloride  3 mL Intravenous Q12H   Continuous Infusions:   Active Problems:   ESRD (end stage renal disease) on dialysis   Cocaine abuse   Anemia   Heme + stool    Time spent: 35 minutes.  Onyx Edgley  [facility] Pager [phone]. If 7PM-7AM, please contact night-coverage at [email or web address], password [facility] 06/22/2013, 9:48 AM  LOS: 1 day

## 2013-06-22 NOTE — Progress Notes (Addendum)
INITIAL NUTRITION ASSESSMENT  DOCUMENTATION CODES Per approved criteria  -Severe malnutrition in the context of chronic illness   INTERVENTION: Add Resource Breeze po daily, each supplement provides 250 kcal and 9 grams of protein. Recommend adding Rena-Vite daily. RD to continue to follow nutrition care plan.  NUTRITION DIAGNOSIS: Increased nutrient needs related to HD as evidenced by estimated needs.   Goal: Intake to meet >90% of estimated nutrition needs.  Monitor:  weight trends, lab trends, I/O's, PO intake, supplement tolerance  Reason for Assessment: Malnutrition Screening Tool  58 y.o. male  Admitting Dx: anemia  ASSESSMENT: PMHx significant for medical noncompliance, ESRD (on HD), HTN, cocaine abuse, DM2. Admitted from HD center with complaints of weakness and lethargy. Work-up reveals anemia.  Plan for CT BM biopsy on 3/9.  Pt states he does not like Nepro and will not drink them.  Potassium 3.5 - low.  Currently ordered for Carbohydrate Modified Medium diet. He states that he ate all of his lunch today. Pt sleepy, unwilling to answer the rest of my questions.  Nutrition Focused Physical Exam:   Subcutaneous Fat:  Orbital Region: WNL  Upper Arm Region: mild-moderate wasting  Thoracic and Lumbar Region: mild-moderate wasting  Muscle:  Temple Region: WNL  Clavicle Bone Region: mild-moderate wasting Clavicle and Acromion Bone Region: mild-moderate wasting Scapular Bone Region: WNL  Dorsal Hand: WNL  Patellar Region: moderate wasting  Anterior Thigh Region: moderate wasting  Posterior Calf Region: moderate-severe wasting   Edema: none present   Pt meets criteria for moderate MALNUTRITION in the context of chronic illness as evidenced by moderate fat and muscle mass loss.  Height: Ht Readings from Last 1 Encounters:  06/21/13 5\' 9"  (1.753 m)    Weight: Wt Readings from Last 1 Encounters:  06/21/13 134 lb 14.7 oz (61.2 kg)    Ideal Body Weight:  160 lb  % Ideal Body Weight: 84%  Wt Readings from Last 10 Encounters:  06/21/13 134 lb 14.7 oz (61.2 kg)  06/12/13 140 lb (63.504 kg)  05/28/13 137 lb 3.2 oz (62.234 kg)  02/12/13 132 lb 14.4 oz (60.283 kg)  01/29/13 137 lb 14.4 oz (62.551 kg)  01/09/13 137 lb 2 oz (62.2 kg)  11/08/12 137 lb 9.6 oz (62.415 kg)  11/08/12 137 lb 9.6 oz (62.415 kg)  06/01/12 148 lb 2.4 oz (67.2 kg)  06/01/12 148 lb 2.4 oz (67.2 kg)    Usual Body Weight: 150 - 160 lb per patient  % Usual Body Weight: 86%  BMI:  Body mass index is 19.92 kg/(m^2). Normal weight  Estimated Nutritional Needs: Kcal: 1850 - 2000 Protein: at least 76 grams daily Fluid: 1.2 liters  Skin: intact  Diet Order: Carb Control  EDUCATION NEEDS: -No education needs identified at this time   Intake/Output Summary (Last 24 hours) at 06/22/13 1510 Last data filed at 06/22/13 0930  Gross per 24 hour  Intake   1285 ml  Output      0 ml  Net   1285 ml    Last BM: 3/5  Labs:   Recent Labs Lab 06/21/13 1333  NA 139  K 3.5*  CL 95*  BUN 42*  CREATININE 4.20*  GLUCOSE 111*    CBG (last 3)  No results found for this basename: GLUCAP,  in the last 72 hours  Scheduled Meds: . calcium acetate  667-2,001 mg Oral See admin instructions  . folic acid  1 mg Oral Daily  . hydrALAZINE  100 mg Oral BID  .  levETIRAcetam  1,000 mg Oral BID  . pantoprazole  40 mg Oral Daily  . sodium chloride  3 mL Intravenous Q12H  . sodium chloride  3 mL Intravenous Q12H    Continuous Infusions:   Past Medical History  Diagnosis Date  . Hypertension   . Anemia, chronic disease   . CHF (congestive heart failure)     diastolic.  EF 60 - 65% per [facility] eco 11/2011  . Cocaine abuse     mentioned in notes from [place]  . Hepatitis C antibody test positive     was HIV negative, 02/28/12  . Hepatitis B core antibody positive     03/01/10  . Positive QuantiFERON-TB Gold test     11/2011  . Helicobacter pylori gastritis      not defined if this was treated  . Polyp of colon, adenomatous     May 2012.  Dr Trenton Founds in [place]  . Shortness of breath   . Hematochezia   . Head injury, closed, with concussion   . History of blood transfusion     "last one was 2 days ago" (11/09/2012)  . Type II diabetes mellitus     on oral pills only  . Arthritis     "right shoulder" (11/09/2012)  . Seizure disorder     questionable history of - will need to clarify with PCP  . Headache(784.0)     "q other day" (11/09/2012)  . ESRD (end stage renal disease) on dialysis since 2012    "[place]; TTS" (11/09/2012)  . Hypercalcemia   . Hyperpotassemia   . Seizures     Past Surgical History  Procedure Laterality Date  . Shoulder open rotator cuff repair Right   . Total knee arthroplasty Left   . Bascilic vein transposition  03/07/2012    Procedure: BASCILIC VEIN TRANSPOSITION;  Surgeon: Conrad [place], MD;  Location: [place];  Service: Vascular;  Laterality: Left;  First Stage  . Insertion of dialysis catheter      right chest  . Bascilic vein transposition Left 05/31/2012    Procedure: BASCILIC VEIN TRANSPOSITION;  Surgeon: Conrad [place], MD;  Location: [place];  Service: Vascular;  Laterality: Left;  Left 2nd Stage Basilic Vein Transposition with gortex graft revision using 88mmx10cm graft  . Shoulder open rotator cuff repair Right     Inda Coke MS, RD, LDN Inpatient Registered Dietitian Pager: [phone] After-hours pager: [phone]

## 2013-06-22 NOTE — Consult Note (Signed)
Agree.  BM bx on Monday.

## 2013-06-22 NOTE — ED Provider Notes (Signed)
Medical screening examination/treatment/procedure(s) were performed by non-physician practitioner and as supervising physician I was immediately available for consultation/collaboration.   EKG Interpretation   Date/Time:  Thursday June 21 2013 12:25:37 EST Ventricular Rate:  80 PR Interval:  178 QRS Duration: 98 QT Interval:  442 QTC Calculation: 509 R Axis:   38 Text Interpretation:  Normal sinus rhythm Possible Left atrial enlargement  Left ventricular hypertrophy with repolarization abnormality Prolonged QT  Abnormal ECG Confirmed by Christy Gentles  MD, Anelia Carriveau ([id number]) on 06/21/2013  12:38:58 PM        Sharyon Cable, MD 06/22/13 [phone]

## 2013-06-22 NOTE — Progress Notes (Signed)
IR aware of request for CT bone marrow biopsy. Had this test in 9/14. Has been admitted with recurrent anemia. Hematology recommends repeat BM biopsy.  IR performed CT BM Bx is done with sedation Pt ate breakfast today and thereofre procedure could not be done until this afternoon. However, BM specimens have to be sent by courier to [facility] by 1200.  Cannot accommodate procedure today and bone marrow bx is not done over weekend as there is no cyto/path staff.  D/w Dr. Juliann Mule If pt remains inpt, we will set him up for [facility] 3/9. If he is otherwise stable and is discharged, we will coordinate with Dr. Juliann Mule office to schedule him for outpt procedure at [facility].  Ascencion Dike PA-C Interventional Radiology 06/22/2013 11:00 AM

## 2013-06-22 NOTE — Consult Note (Signed)
[facility] Renal Consultation Note  Indication for Consultation:  Management of ESRD/hemodialysis; anemia, hypertension/volume and secondary hyperparathyroidism  HPI: [name] is a 58 y.o. male with a history of chronic normocytic anemia requiring recurrent hospitalizations, diabetes Type 2, Hepatitis C, polysubstance abuse, and ESRD on dialysis on TTS at the [facility] who received two units of red blood cells in the ED on 2/24 for hemoglobin of 5.2 and was sent to the hospital yesterday from the dialysis center for a hemoglobin of 6.4, 5.3 on arrival.  He has a history of heme-positive stools and had complete workup in 2012, at which time endoscopy showed a small hiatal hernia and colonoscopy showed an adenomatous polyp, but the patient later refused a recommended capsule endoscopy to evaluate for AVMs.  During hospitalization 9/16 - 23/14, when he required seven units of blood for hemoglobin of 3.8, he was evaluated with CT-guided bone marrow biopsy (9/19), which showed histologic and phenotypic features worrisome for involvement by a B-cell lymphoproliferative process, and was seen once as an outpatient on 10/28 by Dr. Juliann Mule of Hematology, but did not follow-up as required.  [name] often noncompliant with dialysis and recently missed treatments on 2/26 and 3/3.  Currently he is slightly weak, but without dyspnea and appears at baseline.  He feels better after transfusion.  Plan is for him to get bone marrow biopsy on Monday.  He gets max ESA an iron with HD, and although doesn't come to every treatment, it still should have an effect  Dialysis Orders:   TTS @ [facility] 4 hr    59 kg    2K/2.25Ca     400/A1.5      Heparin 1000 U      AVF @ LUA   Hectorol 4 mcg       Epogen 20,000 U       Venofer 100 mg x 10 (through 3/24)  Past Medical History  Diagnosis Date  . Hypertension   . Anemia, chronic disease   . CHF (congestive heart failure)     diastolic.  EF 60 -  65% per [facility] eco 11/2011  . Cocaine abuse     mentioned in notes from [place]  . Hepatitis C antibody test positive     was HIV negative, 02/28/12  . Hepatitis B core antibody positive     03/01/10  . Positive QuantiFERON-TB Gold test     11/2011  . Helicobacter pylori gastritis     not defined if this was treated  . Polyp of colon, adenomatous     May 2012.  Dr Trenton Founds in [place]  . Shortness of breath   . Hematochezia   . Head injury, closed, with concussion   . History of blood transfusion     "last one was 2 days ago" (11/09/2012)  . Type II diabetes mellitus     on oral pills only  . Arthritis     "right shoulder" (11/09/2012)  . Seizure disorder     questionable history of - will need to clarify with PCP  . Headache(784.0)     "q other day" (11/09/2012)  . ESRD (end stage renal disease) on dialysis since 2012    "[place]; TTS" (11/09/2012)  . Hypercalcemia   . Hyperpotassemia   . Seizures    Past Surgical History  Procedure Laterality Date  . Shoulder open rotator cuff repair Right   . Total knee arthroplasty Left   . Bascilic vein  transposition  03/07/2012    Procedure: BASCILIC VEIN TRANSPOSITION;  Surgeon: Conrad [place], MD;  Location: [place];  Service: Vascular;  Laterality: Left;  First Stage  . Insertion of dialysis catheter      right chest  . Bascilic vein transposition Left 05/31/2012    Procedure: BASCILIC VEIN TRANSPOSITION;  Surgeon: Conrad , MD;  Location: [place];  Service: Vascular;  Laterality: Left;  Left 2nd Stage Basilic Vein Transposition with gortex graft revision using 65mx10cm graft  . Shoulder open rotator cuff repair Right    Family History  Problem Relation Age of Onset  . Diabetes Mother   . Hypertension Mother   . Stroke Mother   . Kidney failure Mother   . Cancer Father    Social History  He smokes about 1/2 pack of cigarettes a day, but currently denies any alcohol use. He continues to use crack cocaine occasionally, but  denies other illicit drug use.  Allergies  Allergen Reactions  . Reglan [Metoclopramide] Other (See Comments)    Tardive dyskinesia in 11/2011 in [place]  Prior to Admission medications   Medication Sig Start Date End Date Taking? Authorizing Provider  calcium acetate (PHOSLO) 667 MG capsule Take 667-2,001 mg by mouth See admin instructions. Take 3 capsules (2,0020m three times a day with meals and take 1 capsule a day with snacks   Yes Historical Provider, MD  folic acid (FOLVITE) 1 MG tablet Take 1 mg by mouth daily.   Yes Historical Provider, MD  hydrALAZINE (APRESOLINE) 100 MG tablet Take 100 mg by mouth 2 (two) times daily.   Yes Historical Provider, MD  levETIRAcetam (KEPPRA) 1000 MG tablet Take 1,000 mg by mouth 2 (two) times daily.   Yes Historical Provider, MD  pantoprazole (PROTONIX) 40 MG tablet Take 40 mg by mouth daily.   Yes Historical Provider, MD   Labs:  Results for orders placed during the hospital encounter of 06/21/13 (from the past 48 hour(s))  CBC WITH DIFFERENTIAL     Status: Abnormal   Collection Time    06/21/13  1:09 PM      Result Value Ref Range   WBC 4.4  4.0 - 10.5 K/uL   RBC 1.96 (*) 4.22 - 5.81 MIL/uL   Hemoglobin 5.3 (*) 13.0 - 17.0 g/dL   Comment: REPEATED TO VERIFY     CRITICAL RESULT CALLED TO, READ BACK BY AND VERIFIED WITH:     BRGlencoeA RN [id number]/2013 BY MACEDA, J   HCT 16.6 (*) 39.0 - 52.0 %   MCV 84.7  78.0 - 100.0 fL   MCH 27.0  26.0 - 34.0 pg   MCHC 31.9  30.0 - 36.0 g/dL   RDW 18.4 (*) 11.5 - 15.5 %   Platelets 236  150 - 400 K/uL   Neutrophils Relative % 73  43 - 77 %   Neutro Abs 3.2  1.7 - 7.7 K/uL   Lymphocytes Relative 15  12 - 46 %   Lymphs Abs 0.7  0.7 - 4.0 K/uL   Monocytes Relative 6  3 - 12 %   Monocytes Absolute 0.3  0.1 - 1.0 K/uL   Eosinophils Relative 5  0 - 5 %   Eosinophils Absolute 0.2  0.0 - 0.7 K/uL   Basophils Relative 1  0 - 1 %   Basophils Absolute 0.0  0.0 - 0.1 K/uL  BILIRUBIN, TOTAL     Status: None    Collection Time  06/21/13  1:23 PM      Result Value Ref Range   Total Bilirubin 0.4  0.3 - 1.2 mg/dL  LACTATE DEHYDROGENASE     Status: Abnormal   Collection Time    06/21/13  1:23 PM      Result Value Ref Range   LDH 267 (*) 94 - 250 U/L  I-STAT TROPOININ, ED     Status: None   Collection Time    06/21/13  1:31 PM      Result Value Ref Range   Troponin i, poc 0.06  0.00 - 0.08 ng/mL   Comment 3            Comment: Due to the release kinetics of cTnI,     a negative result within the first hours     of the onset of symptoms does not rule out     myocardial infarction with certainty.     If myocardial infarction is still suspected,     repeat the test at appropriate intervals.  I-STAT CHEM 8, ED     Status: Abnormal   Collection Time    06/21/13  1:33 PM      Result Value Ref Range   Sodium 139  137 - 147 mEq/L   Potassium 3.5 (*) 3.7 - 5.3 mEq/L   Chloride 95 (*) 96 - 112 mEq/L   BUN 42 (*) 6 - 23 mg/dL   Creatinine, Ser 4.20 (*) 0.50 - 1.35 mg/dL   Glucose, Bld 111 (*) 70 - 99 mg/dL   Calcium, Ion 0.98 (*) 1.12 - 1.23 mmol/L   TCO2 33  0 - 100 mmol/L   Hemoglobin 5.8 (*) 13.0 - 17.0 g/dL   HCT 17.0 (*) 39.0 - 52.0 %   Comment NOTIFIED PHYSICIAN    TYPE AND SCREEN     Status: None   Collection Time    06/21/13  2:05 PM      Result Value Ref Range   ABO/RH(D) A POS     Antibody Screen NEG     Sample Expiration 06/24/2013     Unit Number [id number]     Blood Component Type RED CELLS,LR     Unit division 00     Status of Unit ISSUED,FINAL     Transfusion Status OK TO TRANSFUSE     Crossmatch Result Compatible     Unit Number [id number]     Blood Component Type RED CELLS,LR     Unit division 00     Status of Unit ISSUED,FINAL     Transfusion Status OK TO TRANSFUSE     Crossmatch Result Compatible     Unit Number [id number]     Blood Component Type RED CELLS,LR     Unit division 00     Status of Unit ALLOCATED     Transfusion Status OK TO TRANSFUSE      Crossmatch Result Compatible     Unit Number [id number]     Blood Component Type RED CELLS,LR     Unit division 00     Status of Unit ALLOCATED     Transfusion Status OK TO TRANSFUSE     Crossmatch Result Compatible    PREPARE RBC (CROSSMATCH)     Status: None   Collection Time    06/21/13  2:05 PM      Result Value Ref Range   Order Confirmation ORDER PROCESSED BY BLOOD BANK    POC OCCULT BLOOD, ED     Status: Abnormal  Collection Time    06/21/13  2:27 PM      Result Value Ref Range   Fecal Occult Bld POSITIVE (*) NEGATIVE  SAVE SMEAR     Status: None   Collection Time    06/21/13  7:38 PM      Result Value Ref Range   Smear Review SMEAR STAINED AND AVAILABLE FOR REVIEW    HAPTOGLOBIN     Status: None   Collection Time    06/21/13  7:38 PM      Result Value Ref Range   Haptoglobin 127  45 - 215 mg/dL   Comment: Performed at [facility]  CBC     Status: Abnormal   Collection Time    06/22/13 10:50 AM      Result Value Ref Range   WBC 5.6  4.0 - 10.5 K/uL   RBC 2.50 (*) 4.22 - 5.81 MIL/uL   Hemoglobin 7.0 (*) 13.0 - 17.0 g/dL   Comment: POST TRANSFUSION SPECIMEN   HCT 21.9 (*) 39.0 - 52.0 %   MCV 87.6  78.0 - 100.0 fL   MCH 28.0  26.0 - 34.0 pg   MCHC 32.0  30.0 - 36.0 g/dL   RDW 17.4 (*) 11.5 - 15.5 %   Platelets 244  150 - 400 K/uL   Constitutional: positive for fatigue; negative for chills, fever, and sweats Ears, nose, mouth, throat, and face: negative for earaches, hoarseness, nasal congestion and sore throat Respiratory: negative for cough, dyspnea on exertion, hemoptysis and sputum Cardiovascular: negative for chest pain, chest pressure/discomfort, dyspnea, orthopnea and palpitations Gastrointestinal: negative for abdominal pain, change in bowel habits, nausea and vomiting Genitourinary:negative, non-oliguric Musculoskeletal:negative for arthralgias, back pain, myalgias and neck pain Neurological: positive for weakness, negative for  dizziness, headaches, paresthesia and speech problems  Physical Exam: Filed Vitals:   06/22/13 1209  BP: 156/66  Pulse: 76  Temp: 99 F (37.2 C)  Resp: 18     General appearance: alert, cooperative and no distress Head: Normocephalic, without obvious abnormality, atraumatic Neck: no adenopathy, no carotid bruit, no JVD and supple, symmetrical, trachea midline Resp: clear to auscultation bilaterally Cardio: regular rate and rhythm, S1, S2 normal, no murmur, click, rub or gallop GI: soft, non-tender; bowel sounds normal; no masses,  no organomegaly Extremities: extremities normal, atraumatic, no cyanosis or edema Neurologic: Grossly normal Dialysis Access: AVF @ LUA with + bruit   Assessment/Plan: 1. Anemia - Undetermined etiology (ESRD, GI bleeding, minor B-cell population per bone marrow biopsy 01/05/13), Hgb 5.3 yesterday, 7 today s/p 2 U PRBCs; BM Bx ordered, likely on 3/9 per IR. 2. ESRD - HD on TTS @ [facility], K 3.5, often noncompliant with Txs.  HD tomorrow.- no heparin 3. Hypertension/volume - BP 156/66 on Hydralazine 100 mg bid; current wt 61.2 kg s/p HD yesterday. 4. Metabolic bone disease - Last Ca 7.6, P 7.4, iPTH 174; Hectorol 4 mcg, Phoslo 3 with meals. 5. Nutrition - Last Alb 3.2, renal diet & vitamin. 6. Minor B-cell population - per BM Bx 01/05/13, seen by Dr. Juliann Mule of Hematology, but did not follow-up. 7. Hx heme + stools - small hiatal hernia per EGD & adenomatous sigmoid polyp per colonoscopy in 08/2010, gastritis per EGD 2011, capsule endoscopy declined in 10/2012. 8. Hx Dm Type 2  9. Hepatitis C 10. Hx seizure disorder 11. Hx cocaine abuse  [name],Thoms 06/22/2013, 12:12 PM   Attending Nephrologist:  [name], MD  Patient seen and examined, agree with above note  with above modifications. 57 year old BM not previously known to me HD qTTS at [facility]- noncompliant with treatment and binders.  He also has a refractory anemia presents for same associated with  weakness. He did not follow up with GI or Hem onc for previous  issues. Our plan is for him to get HD here tomorrow- then he is due for bone marrow biopsy on Monday  [name], MD 06/22/2013

## 2013-06-22 NOTE — Consult Note (Signed)
IR aware of request for CT bone marrow biopsy.  Had this test in 9/14. Showed features for involvement of B-cell lymphoproliferative B-cell component, but not conclusive Has been admitted with recurrent anemia.  Hematology recommends repeat BM biopsy. Chart, PMHX, meds reviewed. Pt is agreeable to repeat procedure while is here.  Procedure cannot be done until Mon.   Past Medical History:  Past Medical History  Diagnosis Date  . Hypertension   . Anemia, chronic disease   . CHF (congestive heart failure)     diastolic.  EF 60 - 65% per [facility] eco 11/2011  . Cocaine abuse     mentioned in notes from [place]  . Hepatitis C antibody test positive     was HIV negative, 02/28/12  . Hepatitis B core antibody positive     03/01/10  . Positive QuantiFERON-TB Gold test     11/2011  . Helicobacter pylori gastritis     not defined if this was treated  . Polyp of colon, adenomatous     May 2012.  Dr Trenton Founds in [place]  . Shortness of breath   . Hematochezia   . Head injury, closed, with concussion   . History of blood transfusion     "last one was 2 days ago" (11/09/2012)  . Type II diabetes mellitus     on oral pills only  . Arthritis     "right shoulder" (11/09/2012)  . Seizure disorder     questionable history of - will need to clarify with PCP  . Headache(784.0)     "q other day" (11/09/2012)  . ESRD (end stage renal disease) on dialysis since 2012    "[place]; TTS" (11/09/2012)  . Hypercalcemia   . Hyperpotassemia   . Seizures     Past Surgical History:  Past Surgical History  Procedure Laterality Date  . Shoulder open rotator cuff repair Right   . Total knee arthroplasty Left   . Bascilic vein transposition  03/07/2012    Procedure: BASCILIC VEIN TRANSPOSITION;  Surgeon: Conrad Fern Park, MD;  Location: [facility];  Service: Vascular;  Laterality: Left;  First Stage  . Insertion of dialysis catheter      right chest  . Bascilic vein transposition Left 05/31/2012     Procedure: BASCILIC VEIN TRANSPOSITION;  Surgeon: Conrad Drexel, MD;  Location: [facility];  Service: Vascular;  Laterality: Left;  Left 2nd Stage Basilic Vein Transposition with gortex graft revision using 36mx10cm graft  . Shoulder open rotator cuff repair Right     Family History:  Family History  Problem Relation Age of Onset  . Diabetes Mother   . Hypertension Mother   . Stroke Mother   . Kidney failure Mother   . Cancer Father     Social History:  reports that he has been smoking Cigarettes.  He has a 3.75 pack-year smoking history. He has never used smokeless tobacco. He reports that he does not drink alcohol or use illicit drugs.  Allergies:  Allergies  Allergen Reactions  . Reglan [Metoclopramide] Other (See Comments)    [place]   Medications:   Medication List    ASK your doctor about these medications       calcium acetate 667 MG capsule  Commonly known as:  PHOSLO  Take 667-2,001 mg by mouth See admin instructions. Take 3 capsules (2,0014m three times a day with meals and take 1 capsule a day with snacks  folic acid 1 MG tablet  Commonly known as:  FOLVITE  Take 1 mg by mouth daily.     hydrALAZINE 100 MG tablet  Commonly known as:  APRESOLINE  Take 100 mg by mouth 2 (two) times daily.     levETIRAcetam 1000 MG tablet  Commonly known as:  KEPPRA  Take 1,000 mg by mouth 2 (two) times daily.     pantoprazole 40 MG tablet  Commonly known as:  PROTONIX  Take 40 mg by mouth daily.        Please HPI for pertinent positives, otherwise complete 10 system ROS negative.  Physical Exam: BP 156/66  Pulse 76  Temp(Src) 99 F (37.2 C) (Oral)  Resp 18  Ht _0  (1.753 m)  Wt 134 lb 14.7 oz (61.2 kg)  BMI 19.92 kg/m2  SpO2 99% Body mass index is 19.92 kg/(m^2).   General Appearance:  Alert, cooperative, no distress, appears stated age  Head:  Normocephalic, without obvious abnormality, atraumatic  ENT: Unremarkable   Neck: Supple, symmetrical, trachea midline  Lungs:   Clear to auscultation bilaterally, no w/r/r, respirations unlabored without use of accessory muscles.  Chest Wall:  No tenderness or deformity  Heart:  Regular rate and rhythm, S1, S2 normal, no murmur, rub or gallop.  Abdomen:   Soft, non-tender, non distended.  Neurologic: Normal affect, no gross deficits.   Results for orders placed during the hospital encounter of 06/21/13 (from the past 48 hour(s))  CBC WITH DIFFERENTIAL     Status: Abnormal   Collection Time    06/21/13  1:09 PM      Result Value Ref Range   WBC 4.4  4.0 - 10.5 K/uL   RBC 1.96 (*) 4.22 - 5.81 MIL/uL   Hemoglobin 5.3 (*) 13.0 - 17.0 g/dL   Comment: REPEATED TO VERIFY     CRITICAL RESULT CALLED TO, READ BACK BY AND VERIFIED WITH:     BROWN, A RN 1419 06/21/2013 BY MACEDA, J   HCT 16.6 (*) 39.0 - 52.0 %   MCV 84.7  78.0 - 100.0 fL   MCH 27.0  26.0 - 34.0 pg   MCHC 31.9  30.0 - 36.0 g/dL   RDW 18.4 (*) 11.5 - 15.5 %   Platelets 236  150 - 400 K/uL   Neutrophils Relative % 73  43 - 77 %   Neutro Abs 3.2  1.7 - 7.7 K/uL   Lymphocytes Relative 15  12 - 46 %   Lymphs Abs 0.7  0.7 - 4.0 K/uL   Monocytes Relative 6  3 - 12 %   Monocytes Absolute 0.3  0.1 - 1.0 K/uL   Eosinophils Relative 5  0 - 5 %   Eosinophils Absolute 0.2  0.0 - 0.7 K/uL   Basophils Relative 1  0 - 1 %   Basophils Absolute 0.0  0.0 - 0.1 K/uL  BILIRUBIN, TOTAL     Status: None   Collection Time    06/21/13  1:23 PM      Result Value Ref Range   Total Bilirubin 0.4  0.3 - 1.2 mg/dL  LACTATE DEHYDROGENASE     Status: Abnormal   Collection Time    06/21/13  1:23 PM      Result Value Ref Range   LDH 267 (*) 94 - 250 U/L  I-STAT TROPOININ, ED     Status: None   Collection Time    06/21/13  1:31 PM      Result  Value Ref Range   Troponin i, poc 0.06  0.00 - 0.08 ng/mL   Comment 3            Comment: Due to the release kinetics of cTnI,     a negative result within the first hours      of the onset of symptoms does not rule out     myocardial infarction with certainty.     If myocardial infarction is still suspected,     repeat the test at appropriate intervals.  I-STAT CHEM 8, ED     Status: Abnormal   Collection Time    06/21/13  1:33 PM      Result Value Ref Range   Sodium 139  137 - 147 mEq/L   Potassium 3.5 (*) 3.7 - 5.3 mEq/L   Chloride 95 (*) 96 - 112 mEq/L   BUN 42 (*) 6 - 23 mg/dL   Creatinine, Ser 4.20 (*) 0.50 - 1.35 mg/dL   Glucose, Bld 111 (*) 70 - 99 mg/dL   Calcium, Ion 0.98 (*) 1.12 - 1.23 mmol/L   TCO2 33  0 - 100 mmol/L   Hemoglobin 5.8 (*) 13.0 - 17.0 g/dL   HCT 17.0 (*) 39.0 - 52.0 %   Comment NOTIFIED PHYSICIAN    TYPE AND SCREEN     Status: None   Collection Time    06/21/13  2:05 PM      Result Value Ref Range   ABO/RH(D) A POS     Antibody Screen NEG     Sample Expiration 06/24/2013     Unit Number [id number]     Blood Component Type RED CELLS,LR     Unit division 00     Status of Unit ISSUED,FINAL     Transfusion Status OK TO TRANSFUSE     Crossmatch Result Compatible     Unit Number [id number]     Blood Component Type RED CELLS,LR     Unit division 00     Status of Unit ISSUED,FINAL     Transfusion Status OK TO TRANSFUSE     Crossmatch Result Compatible     Unit Number [id number]     Blood Component Type RED CELLS,LR     Unit division 00     Status of Unit ALLOCATED     Transfusion Status OK TO TRANSFUSE     Crossmatch Result Compatible     Unit Number [id number]     Blood Component Type RED CELLS,LR     Unit division 00     Status of Unit ALLOCATED     Transfusion Status OK TO TRANSFUSE     Crossmatch Result Compatible    PREPARE RBC (CROSSMATCH)     Status: None   Collection Time    06/21/13  2:05 PM      Result Value Ref Range   Order Confirmation ORDER PROCESSED BY BLOOD BANK    POC OCCULT BLOOD, ED     Status: Abnormal   Collection Time    06/21/13  2:27 PM      Result Value Ref Range   Fecal  Occult Bld POSITIVE (*) NEGATIVE  SAVE SMEAR     Status: None   Collection Time    06/21/13  7:38 PM      Result Value Ref Range   Smear Review SMEAR STAINED AND AVAILABLE FOR REVIEW    HAPTOGLOBIN     Status: None   Collection Time    06/21/13  7:38  PM      Result Value Ref Range   Haptoglobin 127  45 - 215 mg/dL   Comment: Performed at [facility]  CBC     Status: Abnormal   Collection Time    06/22/13 10:50 AM      Result Value Ref Range   WBC 5.6  4.0 - 10.5 K/uL   RBC 2.50 (*) 4.22 - 5.81 MIL/uL   Hemoglobin 7.0 (*) 13.0 - 17.0 g/dL   Comment: POST TRANSFUSION SPECIMEN   HCT 21.9 (*) 39.0 - 52.0 %   MCV 87.6  78.0 - 100.0 fL   MCH 28.0  26.0 - 34.0 pg   MCHC 32.0  30.0 - 36.0 g/dL   RDW 17.4 (*) 11.5 - 15.5 %   Platelets 244  150 - 400 K/uL   No results found.  Assessment/Plan Chronic anemia of uncertain etiology. GI does not feel related to acute GI blood loss. Hematology requests repeat CT guided BM biopsy. Have tentatively put pt on schedule for Mon 3/9 at 0900 Pt could be discharged as early as 2 hrs post procedure. If he is discharged, can be scheduled to be done at [facility] as outpt, though pt has history of noncompliance. Discussed procedure, risks, complications, use of sedation. Labs noted. Consent signed in chart  Ascencion Dike PA-C 06/22/2013, 2:52 PM

## 2013-06-22 NOTE — Care Management Note (Addendum)
   CARE MANAGEMENT NOTE 06/22/2013  Patient:  [name],[name]   Account Number:  [id number]  Date Initiated:  06/22/2013  Documentation initiated by:  Hurschel Paynter  Subjective/Objective Assessment:   MD reported that pt needs transportation.     Action/Plan:   06/22/2013 Met with pt who states that he lives in [place], and will need transportation home at the time of d/c. Pt states that he has transportation to hemodialysis. Referral made to CSW to assist with transportation at time of d/c.    Anticipated DC Date:  06/25/2013   Anticipated DC Plan:  HOME/SELF CARE         Choice offered to / List presented to:             Status of service:  In process, will continue to follow Medicare Important Message given?   (If response is "NO", the following Medicare IM given date fields will be blank) Date Medicare IM given:   Date Additional Medicare IM given:    Discharge Disposition:    Per UR Regulation:    If discussed at Long Length of Stay Meetings, dates discussed:    Comments:

## 2013-06-22 NOTE — Progress Notes (Signed)
Temperature equals 100.5, patient having blood transfusion. Notified NP on call, 650 mg tylenol given per order. Temperature equals 99.4 an hour after tylenol.

## 2013-06-22 NOTE — Progress Notes (Signed)
[name]   DOB:1955/06/20   FH#:[id number]   LHT#:[id number]  Subjective:  Patient states he was locked up and not able to follow up with me in the office.  He also reports that he has a court date on April 13th. He feels better.  He denies night sweats, fevers or chills.    Objective:  Filed Vitals:   06/22/13 1742  BP: 149/69  Pulse: 81  Temp: 98.5 F (36.9 C)  Resp: 18    Body mass index is 19.92 kg/(m^2).  Intake/Output Summary (Last 24 hours) at 06/22/13 1915 Last data filed at 06/22/13 0930  Gross per 24 hour  Intake  732.5 ml  Output      0 ml  Net  732.5 ml    Thin, chronically ill appearing  Sclerae unicteric  Oropharynx poor dentition  No peripheral adenopathy in axillary, neck and supraclavicular  Lungs clear -- no rales or rhonchi  Heart regular rate and rhythm  Abdomen benign, no enlarged spleen  MSK no peripheral edema, L UE fistula  Neuro nonfocal  Labs:  Lab Results  Component Value Date   WBC 5.6 06/22/2013   HGB 7.0* 06/22/2013   HCT 21.9* 06/22/2013   MCV 87.6 06/22/2013   PLT 244 06/22/2013   NEUTROABS 3.2 08/24/2618    Basic Metabolic Panel:  Recent Labs Lab 06/21/13 1333  NA 139  K 3.5*  CL 95*  GLUCOSE 111*  BUN 42*  CREATININE 4.20*   GFR Estimated Creatinine Clearance: 17.2 ml/min (by C-G formula based on Cr of 4.2). Liver Function Tests:  Recent Labs Lab 06/21/13 1323  BILITOT 0.4    CBC:  Recent Labs Lab 06/21/13 1309 06/21/13 1333 06/22/13 1050  WBC 4.4  --  5.6  NEUTROABS 3.2  --   --   HGB 5.3* 5.8* 7.0*  HCT 16.6* 17.0* 21.9*  MCV 84.7  --  87.6  PLT 236  --  244   PATHOLOGY:  NORMOCELLULAR BONE MARROW FOR AGE WITH ATYPICAL LYMPHOID AGGREGATES.  - TRILINEAGE HEMATOPOIESIS.  - SEE COMMENT  PERIPHERAL BLOOD:  - NORMOCYTIC-NORMOCHROMIC ANEMIA.  Diagnosis Note  The bone marrow shows several variably sized interstitial and paratrabecular lymphoid aggregates mostly composed of small lymphoid cells. Flow cytometric  analysis failed to show any significant changes likely due to sampling. Immunohistochemical stains performed on core biopsy show that the lymphoid aggregates are a mixture of T and B cells but with a significant B cell component. B cells in particular lack CD5, CD10, or cyclin D1 expression. The overall histologic and phenotypic features are worrisome for involvement by a B cell lymphoproliferative process. Full hematologic evaluation is recommended  FLOW CYTOMETRY:  - PREDOMINANCE OF T CELLS WITH NO ABNORMAL PHENOTYPE.  - MINOR B CELL POPULATION PRESENT.  - SEE NOTE.  Diagnosis Comment:  There is a minor B cell population present (14% of lymphocytes) expressing pan B cell antigens but with  extremely dim/negative staining for surface immunoglobulin light chains that hinder assessment and/or  quantitation of clonality. However, an abnormal B cell phenotype such as co-expression of CD5 and CD20 or CD10 expression is not identified. Clinical correlation is recommended. (BNS:gt, 01/09/13)  Joella Prince SMIR MD  SS AND MICROSCOPIC INFORMATION  Studies:  No results found.  Assessment: 58 y.o.  Plan: PLAN:  1. Minor B cell population (14% of lymphocytes)  --We reviewed his pathology with Dr. Gari Crown of pathology. 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  He suggested a  repeat biopsy while he is in patient.  It is scheduled for Monday.   He does not have lymphadenopathy on exam.  He does not have constitutional symptoms such as night sweats, fevers or weight lost.   Await repeat bone marrow biopsy.   2. Anemia of mixed etiology --Agree with renal to maximize epo with dialysis.  We will work-up #1 to elucidate degree of bone marrow involvement.  We will review with Dr. Gari Crown and send for flow cytometry.  Given patient's non-compliance and evident my prior no shows with my office, future therapy will be challenging.  Transfuse prn symptoms of anemia.   3. ESRDz on dialysis.   --As noted above, he will continue his dialysis. He has a history of non-compliance.   4. Polysubstance abuse including crack cocaine, tobacco abuse and a history of non-compliance.  --I discussed the importance of abstinence and offered assistance through social support programs, tobacco cessation abuse counseling and the patient declined smoking cessation but he was agreeable for consultation with social work Polo Riley at [phone]) regarding his frequent crack cocaine use.  I referred him from my office.   We will follow this patient with you.     Ike Maragh, MD 06/22/2013  7:15 PM

## 2013-06-23 LAB — HEPATITIS B SURFACE ANTIBODY,QUALITATIVE: HEP B S AB: POSITIVE — AB

## 2013-06-23 LAB — RENAL FUNCTION PANEL
ALBUMIN: 2.5 g/dL — AB (ref 3.5–5.2)
BUN: 88 mg/dL — ABNORMAL HIGH (ref 6–23)
CHLORIDE: 95 meq/L — AB (ref 96–112)
CO2: 25 mEq/L (ref 19–32)
Calcium: 7.5 mg/dL — ABNORMAL LOW (ref 8.4–10.5)
Creatinine, Ser: 7.4 mg/dL — ABNORMAL HIGH (ref 0.50–1.35)
GFR calc non Af Amer: 7 mL/min — ABNORMAL LOW (ref >90)
GFR, EST AFRICAN AMERICAN: 9 mL/min — AB (ref >90)
Glucose, Bld: 123 mg/dL — ABNORMAL HIGH (ref 70–99)
POTASSIUM: 5.2 meq/L (ref 3.7–5.3)
Phosphorus: 5.1 mg/dL — ABNORMAL HIGH (ref 2.3–4.6)
Sodium: 137 mEq/L (ref 137–147)

## 2013-06-23 LAB — CBC
HEMATOCRIT: 21.1 % — AB (ref 39.0–52.0)
HEMOGLOBIN: 6.8 g/dL — AB (ref 13.0–17.0)
MCH: 28.3 pg (ref 26.0–34.0)
MCHC: 32.2 g/dL (ref 30.0–36.0)
MCV: 87.9 fL (ref 78.0–100.0)
Platelets: 225 10*3/uL (ref 150–400)
RBC: 2.4 MIL/uL — ABNORMAL LOW (ref 4.22–5.81)
RDW: 17.6 % — ABNORMAL HIGH (ref 11.5–15.5)
WBC: 5.7 10*3/uL (ref 4.0–10.5)

## 2013-06-23 LAB — HEPATITIS B SURFACE ANTIGEN: HEP B S AG: NEGATIVE

## 2013-06-23 MED ORDER — DIPHENHYDRAMINE HCL 25 MG PO CAPS
25.0000 mg | ORAL_CAPSULE | Freq: Once | ORAL | Status: AC
  Administered 2013-06-23: 25 mg via ORAL
  Filled 2013-06-23: qty 1

## 2013-06-23 MED ORDER — ALTEPLASE 2 MG IJ SOLR
2.0000 mg | Freq: Once | INTRAMUSCULAR | Status: DC | PRN
  Filled 2013-06-23: qty 2

## 2013-06-23 MED ORDER — LIDOCAINE HCL (PF) 1 % IJ SOLN: 5.0000 mL | INTRAMUSCULAR | Status: DC | PRN

## 2013-06-23 MED ORDER — HEPARIN SODIUM (PORCINE) 1000 UNIT/ML DIALYSIS: 1000.0000 [IU] | INTRAMUSCULAR | Status: DC | PRN

## 2013-06-23 MED ORDER — LIDOCAINE-PRILOCAINE 2.5-2.5 % EX CREA
1.0000 "application " | TOPICAL_CREAM | CUTANEOUS | Status: DC | PRN
  Filled 2013-06-23: qty 5

## 2013-06-23 MED ORDER — PENTAFLUOROPROP-TETRAFLUOROETH EX AERO: 1.0000 "application " | INHALATION_SPRAY | CUTANEOUS | Status: DC | PRN

## 2013-06-23 MED ORDER — NEPRO/CARBSTEADY PO LIQD
237.0000 mL | ORAL | Status: DC | PRN
  Filled 2013-06-23: qty 237

## 2013-06-23 MED ORDER — SODIUM CHLORIDE 0.9 % IV SOLN: 100.0000 mL | INTRAVENOUS | Status: DC | PRN

## 2013-06-23 NOTE — Progress Notes (Signed)
Utilization Review completed.  

## 2013-06-23 NOTE — Progress Notes (Signed)
Subjective:   Seen on dialysis, still feels weak, but no dyspnea  Objective: Vital signs in last 24 hours: Temp:  [98.4 F (36.9 C)-99.4 F (37.4 C)] 98.5 F (36.9 C) (03/07 0656) Pulse Rate:  [76-81] 81 (03/07 0733) Resp:  [18-21] 20 (03/07 0733) BP: (138-156)/(63-76) 138/70 mmHg (03/07 0733) SpO2:  [90 %-100 %] 90 % (03/07 0656) Weight:  [64.6 kg (142 lb 6.7 oz)] 64.6 kg (142 lb 6.7 oz) (03/07 0656) Weight change: 3.4 kg (7 lb 7.9 oz)  Intake/Output from previous day: 03/06 0701 - 03/07 0700 In: 723 [P.O.:720; I.V.:3] Out: -  Intake/Output this shift:   Lab Results:  Recent Labs  06/22/13 1050 06/23/13 0659  WBC 5.6 5.7  HGB 7.0* 6.8*  HCT 21.9* 21.1*  PLT 244 225   BMET:  Recent Labs  06/21/13 1333  NA 139  K 3.5*  CL 95*  GLUCOSE 111*  BUN 42*  CREATININE 4.20*   No results found for this basename: PTH,  in the last 72 hours Iron Studies: No results found for this basename: IRON, TIBC, TRANSFERRIN, FERRITIN,  in the last 72 hours  EXAM: General appearance:  Alert, in no apparent distress Resp:  CTA without rales, rhonchi, or wheezes Cardio:  RRR without murmur or rub GI: + BS, soft and nontender Extremities:   No edema Access:  AVF @ LUA with BFR 400 cc/min  Dialysis Orders: TTS @ [facility]  4 hr 59 kg 2K/2.25Ca 400/A1.5 Heparin 1000 U AVF @ LUA  Hectorol 4 mcg Epogen 20,000 U Venofer 100 mg x 10 (through 3/24)   Assessment/Plan: 1. Anemia - Undetermined etiology (ESRD, GI bleeding, minor B-cell population per bone marrow biopsy 01/05/13), initial Hgb 5.3, yesterday 7 s/p 2 U PRBCs, today 6.8; transfuse 2 U today, BM Bx on 3/9 per IR.  2. ESRD - HD on TTS @ [facility], K 3.5, often noncompliant with Txs. HD today, no Heparin.  3. HTN/volume - BP 138/70 on Hydralazine 100 mg bid; current wt 64.6 kg.  Increase UF goal to 4.5 L.- may be too much, will follow 4. Metabolic bone disease - Ca 7.5 (8.7 corrected), P 5.1, iPTH 174; Hectorol 4 mcg, Phoslo 3 with meals-  is better in a controlled environment.  5. Nutrition - Alb 2.5, renal diet & vitamin.  6. 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 Juliann Mule of Hematology, BM Bx on 3/9.  7. Hx heme + stools - small hiatal hernia per EGD & adenomatous sigmoid polyp per colonoscopy in 08/2010, gastritis per EGD 2011, capsule endoscopy declined in 10/2012.  8. Hx Dm Type 2  9. Hepatitis C  10. Hx seizure disorder  11. Hx cocaine abuse     LOS: 2 days   [name],Rody 06/23/2013,7:39 AM  Patient seen and examined, agree with above note with above modifications.  Seen on HD, will transfuse 2 more units today.  To stay in house for a bone marrow biopsy Monday.  BP and elytes better in a controlled environment [name], MD 06/23/2013

## 2013-06-23 NOTE — Procedures (Signed)
Patient was seen on dialysis and the procedure was supervised.  BFR 400  Via AVF BP is  168/89.   Patient appears to be tolerating treatment well  [name] A 06/23/2013

## 2013-06-23 NOTE — Progress Notes (Signed)
CRITICAL VALUE ALERT  Critical value received: Hgb 6.8  Date of notification:  06/23/13  Time of notification:  0730  Critical value read back:yes  Nurse who received alert: Louretta Shorten  MD notified (1st page):  [name]  Time of first page:  0730 verbal, PA on unit  MD notified (2nd page):  Time of second page:  Responding MD:  Prudencio Burly  Time MD responded:  [id number] orders revieved

## 2013-06-23 NOTE — Progress Notes (Signed)
[facility] PROGRESS NOTE  [name] WNU:[id number] DOB: 06-22-1955 DOA: 06/21/2013 PCP: Windy Kalata, MD  Assessment/Plan: 1-Anemia- chronic- ?etiology ESRD vs heme + stools vs hematologic issue- Had BMB that showed minor B-cell population.  [name] S/P  2 units PRBC 3-5. He will received 2 more units of PRBC 3-7 with dialysis.  Heme + stools- chronic issues- patient has gastritis Dr Osie Bond with Dr Henrene Pastor nothing from GI standpoint to do in hospital; if patient would allow he would do outpatient capsule endoscopy.  Plan for BM biopsy on Monday. Patient likely wont follow up if he is discharge prior to Biopsy and he is at high risk for readmission.  Hb at 6.8 today.   2-cocaine abuse- encourage cessation   3-ESRD- HD t/th/sat- renal consulted.   4-History of Seizure; continue with Keppra.    Code Status: Full Code.  Family Communication: Care discussed with patient.  Disposition Plan: to be determine.    Consultants:  Hematology  Nephrology  Procedures:  none  Antibiotics:  none  HPI/Subjective: No complaints, wants to eat.   Objective: Filed Vitals:   06/23/13 0733  BP: 138/70  Pulse: 81  Temp:   Resp: 20    Intake/Output Summary (Last 24 hours) at 06/23/13 0759 Last data filed at 06/23/13 0645  Gross per 24 hour  Intake    723 ml  Output      0 ml  Net    723 ml   Filed Weights   06/21/13 1802 06/23/13 0656  Weight: 61.2 kg (134 lb 14.7 oz) 64.6 kg (142 lb 6.7 oz)    Exam:   General:  No distress.   Cardiovascular: S 1, S 2 RRR  Respiratory: few crackles.   Abdomen: Bs present, soft.   Musculoskeletal: no edema.   Data Reviewed: Basic Metabolic Panel:  Recent Labs Lab 06/21/13 1333 06/23/13 0659  NA 139 137  K 3.5* 5.2  CL 95* 95*  CO2  --  25  GLUCOSE 111* 123*  BUN 42* 88*  CREATININE 4.20* 7.40*  CALCIUM  --  7.5*  PHOS  --  5.1*   Liver Function Tests:  Recent Labs Lab 06/21/13 1323 06/23/13 0659   BILITOT 0.4  --   ALBUMIN  --  2.5*   No results found for this basename: LIPASE, AMYLASE,  in the last 168 hours No results found for this basename: AMMONIA,  in the last 168 hours CBC:  Recent Labs Lab 06/21/13 1309 06/21/13 1333 06/22/13 1050 06/23/13 0659  WBC 4.4  --  5.6 5.7  NEUTROABS 3.2  --   --   --   HGB 5.3* 5.8* 7.0* 6.8*  HCT 16.6* 17.0* 21.9* 21.1*  MCV 84.7  --  87.6 87.9  PLT 236  --  244 225   Cardiac Enzymes: No results found for this basename: CKTOTAL, CKMB, CKMBINDEX, TROPONINI,  in the last 168 hours BNP (last 3 results) No results found for this basename: PROBNP,  in the last 8760 hours CBG: No results found for this basename: GLUCAP,  in the last 168 hours  No results found for this or any previous visit (from the past 240 hour(s)).   Studies: No results found.  Scheduled Meds: . calcium acetate  667-2,001 mg Oral See admin instructions  . folic acid  1 mg Oral Daily  . hydrALAZINE  100 mg Oral BID  . levETIRAcetam  1,000 mg Oral BID  . pantoprazole  40 mg Oral Daily  .  sodium chloride  3 mL Intravenous Q12H  . sodium chloride  3 mL Intravenous Q12H   Continuous Infusions:   Active Problems:   ESRD (end stage renal disease) on dialysis   Cocaine abuse   Anemia   Heme + stool    Time spent: 25 minutes.     Kimber Relic  [facility] Pager [phone]. If 7PM-7AM, please contact night-coverage at [email or web address], password [facility] 06/23/2013, 7:59 AM  LOS: 2 days

## 2013-06-24 DIAGNOSIS — E119 Type 2 diabetes mellitus without complications: Secondary | ICD-10-CM

## 2013-06-24 LAB — TYPE AND SCREEN
ABO/RH(D): A POS
ANTIBODY SCREEN: NEGATIVE
UNIT DIVISION: 0
UNIT DIVISION: 0
Unit division: 0
Unit division: 0

## 2013-06-24 LAB — CBC
HCT: 26.1 % — ABNORMAL LOW (ref 39.0–52.0)
Hemoglobin: 8.4 g/dL — ABNORMAL LOW (ref 13.0–17.0)
MCH: 28.9 pg (ref 26.0–34.0)
MCHC: 32.2 g/dL (ref 30.0–36.0)
MCV: 89.7 fL (ref 78.0–100.0)
Platelets: 232 10*3/uL (ref 150–400)
RBC: 2.91 MIL/uL — ABNORMAL LOW (ref 4.22–5.81)
RDW: 17.1 % — ABNORMAL HIGH (ref 11.5–15.5)
WBC: 6.9 10*3/uL (ref 4.0–10.5)

## 2013-06-24 MED ORDER — ZOLPIDEM TARTRATE 5 MG PO TABS
5.0000 mg | ORAL_TABLET | Freq: Every evening | ORAL | Status: DC | PRN
  Administered 2013-06-24: 5 mg via ORAL
  Filled 2013-06-24: qty 1

## 2013-06-24 NOTE — Progress Notes (Signed)
[facility] PROGRESS NOTE  [name] [id number]:[id number] DOB: 28-May-1955 DOA: 06/21/2013 PCP: Windy Kalata, MD  Assessment/Plan: 1-Anemia- chronic- ?etiology ESRD vs heme + stools vs hematologic issue- Had BMB that showed minor B-cell population.  [name] S/P  2 units PRBC 3-5. Received 2 more units of PRBC 3-7 with dialysis.  Heme + stools- chronic issues- patient has gastritis Dr Osie Bond with Dr Henrene Pastor nothing from GI standpoint to do in hospital; if patient would allow he would do outpatient capsule endoscopy.  Plan for BM biopsy on Monday.  Hb at 8. I Have explain to patient that he needs to follow up with dr Juliann Mule for further therapy.   2-cocaine abuse- encourage cessation   3-ESRD- HD t/th/sat- renal following.   4-History of Seizure; continue with Keppra.    Code Status: Full Code.  Family Communication: Care discussed with patient.  Disposition Plan: to be determine.    Consultants:  Hematology  Nephrology  Procedures:  none  Antibiotics:  none  HPI/Subjective: No complaints, feeling well.   Objective: Filed Vitals:   06/24/13 0920  BP: 152/58  Pulse: 88  Temp: 98.2 F (36.8 C)  Resp: 20    Intake/Output Summary (Last 24 hours) at 06/24/13 1230 Last data filed at 06/24/13 0900  Gross per 24 hour  Intake   1326 ml  Output      1 ml  Net   1325 ml   Filed Weights   06/21/13 1802 06/23/13 0656 06/23/13 1000  Weight: 61.2 kg (134 lb 14.7 oz) 64.6 kg (142 lb 6.7 oz) 62.5 kg (137 lb 12.6 oz)    Exam:   General:  No distress.   Cardiovascular: S 1, S 2 RRR  Respiratory: few crackles.   Abdomen: Bs present, soft.   Musculoskeletal: no edema.   Data Reviewed: Basic Metabolic Panel:  Recent Labs Lab 06/21/13 1333 06/23/13 0659  NA 139 137  K 3.5* 5.2  CL 95* 95*  CO2  --  25  GLUCOSE 111* 123*  BUN 42* 88*  CREATININE 4.20* 7.40*  CALCIUM  --  7.5*  PHOS  --  5.1*   Liver Function Tests:  Recent Labs Lab  06/21/13 1323 06/23/13 0659  BILITOT 0.4  --   ALBUMIN  --  2.5*   No results found for this basename: LIPASE, AMYLASE,  in the last 168 hours No results found for this basename: AMMONIA,  in the last 168 hours CBC:  Recent Labs Lab 06/21/13 1309 06/21/13 1333 06/22/13 1050 06/23/13 0659 06/24/13 0536  WBC 4.4  --  5.6 5.7 6.9  NEUTROABS 3.2  --   --   --   --   HGB 5.3* 5.8* 7.0* 6.8* 8.4*  HCT 16.6* 17.0* 21.9* 21.1* 26.1*  MCV 84.7  --  87.6 87.9 89.7  PLT 236  --  244 225 232   Cardiac Enzymes: No results found for this basename: CKTOTAL, CKMB, CKMBINDEX, TROPONINI,  in the last 168 hours BNP (last 3 results) No results found for this basename: PROBNP,  in the last 8760 hours CBG: No results found for this basename: GLUCAP,  in the last 168 hours  No results found for this or any previous visit (from the past 240 hour(s)).   Studies: No results found.  Scheduled Meds: . calcium acetate  667-2,001 mg Oral See admin instructions  . folic acid  1 mg Oral Daily  . hydrALAZINE  100 mg Oral BID  . levETIRAcetam  1,000  mg Oral BID  . pantoprazole  40 mg Oral Daily  . sodium chloride  3 mL Intravenous Q12H  . sodium chloride  3 mL Intravenous Q12H   Continuous Infusions:   Active Problems:   ESRD (end stage renal disease) on dialysis   Cocaine abuse   Anemia   Heme + stool    Time spent: 25 minutes.     Kimber Relic  [facility] Pager [phone]. If 7PM-7AM, please contact night-coverage at [email or web address], password [facility] 06/24/2013, 12:30 PM  LOS: 3 days

## 2013-06-24 NOTE — Progress Notes (Signed)
Subjective:  Feeling slightly better s/p transfusion yesterday, but cannot sleep, awaiting bone marrow biopsy tomorrow  Objective: Vital signs in last 24 hours: Temp:  [97.8 F (36.6 C)-98.7 F (37.1 C)] 98.1 F (36.7 C) (03/08 0436) Pulse Rate:  [77-86] 81 (03/08 0436) Resp:  [14-22] 20 (03/08 0436) BP: (139-187)/(61-94) 139/64 mmHg (03/08 0436) SpO2:  [96 %-100 %] 96 % (03/08 0436) Weight:  [62.5 kg (137 lb 12.6 oz)] 62.5 kg (137 lb 12.6 oz) (03/07 1000) Weight change: -2.1 kg (-4 lb 10.1 oz)  Intake/Output from previous day: 03/07 0701 - 03/08 0700 In: 1996 [P.O.:1320; I.V.:6; Blood:670] Out: 2172    Lab Results:  Recent Labs  06/23/13 0659 06/24/13 0536  WBC 5.7 6.9  HGB 6.8* 8.4*  HCT 21.1* 26.1*  PLT 225 232   BMET:  Recent Labs  06/21/13 1333 06/23/13 0659  NA 139 137  K 3.5* 5.2  CL 95* 95*  CO2  --  25  GLUCOSE 111* 123*  BUN 42* 88*  CREATININE 4.20* 7.40*  CALCIUM  --  7.5*  ALBUMIN  --  2.5*   No results found for this basename: PTH,  in the last 72 hours Iron Studies: No results found for this basename: IRON, TIBC, TRANSFERRIN, FERRITIN,  in the last 72 hours  EXAM:  General appearance: Alert, in no apparent distress  Resp: CTA without rales, rhonchi, or wheezes  Cardio: RRR without murmur or rub  GI: + BS, soft and nontender  Extremities: No edema  Access: AVF @ LUA with + bruit   Dialysis Orders: TTS @ [facility]  4 hr 59 kg 2K/2.25Ca 400/A1.5 Heparin 1000 U AVF @ LUA  Hectorol 4 mcg Epogen 20,000 U Venofer 100 mg x 10 (through 3/24)   Assessment/Plan: 1. Anemia - Undetermined etiology (ESRD, GI bleeding, minor B-cell population per bone marrow biopsy 01/05/13), initial Hgb 5.3, s/p 2 U PRBCs 3/6 & again yesterday, Hgb now 8.4; BM Bx on 3/9 per IR. I assume that he will go home tomorrow after biopsy- I attempted to impress upon him that follow up is needed to determine course of treatment 2. ESRD - HD on TTS @ [facility], K 5.2 pre-HD yesterday,  often noncompliant with Txs. Next HD 3/10, no Heparin.  3. HTN/volume - BP 139/64 on Hydralazine 100 mg bid; wt 62.5 kg s/p net UF 2.2 L yesterday.  4. Metabolic bone disease - Ca 7.5 (8.7 corrected), P 5.1, iPTH 174; Hectorol 4 mcg, Phoslo 3 with meals.  5. Nutrition - Alb 2.5, renal diet & vitamin.  6. 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 Juliann Mule of Hematology, BM Bx on 3/9.  7. Hx heme + stools - small hiatal hernia per EGD & adenomatous sigmoid polyp per colonoscopy in 08/2010, gastritis per EGD 2011, capsule endoscopy declined in 10/2012.  8. Hx DM Type 2  9. Hepatitis C  10. Hx seizure disorder  11. Hx cocaine abuse     LOS: 3 days   RandallBright 06/24/2013,8:00 AM  Patient seen and examined, agree with above note with above modifications. No new complaints.  For bone marrow biopsy tomorrow- then likely will be discharged.  This is a difficult situation as not sure patient will follow up [name] Parish, MD 06/24/2013

## 2013-06-25 ENCOUNTER — Inpatient Hospital Stay (HOSPITAL_COMMUNITY): Payer: Medicare Other

## 2013-06-25 ENCOUNTER — Encounter (HOSPITAL_COMMUNITY): Payer: Self-pay | Admitting: Radiology

## 2013-06-25 LAB — BONE MARROW EXAM

## 2013-06-25 MED ORDER — FENTANYL CITRATE 0.05 MG/ML IJ SOLN
INTRAMUSCULAR | Status: AC
  Filled 2013-06-25: qty 4

## 2013-06-25 MED ORDER — MIDAZOLAM HCL 2 MG/2ML IJ SOLN
INTRAMUSCULAR | Status: AC
  Filled 2013-06-25: qty 4

## 2013-06-25 MED ORDER — LIDOCAINE HCL 1 % IJ SOLN
INTRAMUSCULAR | Status: AC
  Administered 2013-06-25: 12:00:00
  Filled 2013-06-25: qty 10

## 2013-06-25 MED ORDER — MIDAZOLAM HCL 2 MG/2ML IJ SOLN
INTRAMUSCULAR | Status: AC | PRN
  Administered 2013-06-25: 1 mg via INTRAVENOUS

## 2013-06-25 MED ORDER — FENTANYL CITRATE 0.05 MG/ML IJ SOLN
INTRAMUSCULAR | Status: AC | PRN
  Administered 2013-06-25: 50 ug via INTRAVENOUS

## 2013-06-25 NOTE — Procedures (Signed)
BMBx

## 2013-06-25 NOTE — Clinical Documentation Improvement (Signed)
To Hospitalist MD's, NP's, and PA's  Possible Clinical Conditions?  Patient with history of moderate malnutrition, per current dietitian evaluation meets criteria for "severe malnutrition"  BMI 19.92 with "moderate fat and muscle loss"  if this is an appropriate clinical condition please document in notes.Thank you  Severe Malnutrition    Protein Calorie Malnutrition  Severe Protein Calorie Malnutrition  Other Condition  Cannot clinically determine     Recommendations:Add Resource Breeze po daily, each supplement provides 250 kcal and 9 grams of protein.  Recommend adding Rena-Vite daily.  RD to continue to follow nutrition care plan.      Thank You, Ree Kida ,RN Clinical Documentation Specialist:  [facility] Information Management

## 2013-06-25 NOTE — Discharge Summary (Addendum)
Physician Discharge Summary  [name] [id number] DOB: [birth year] DOA: 06/21/2013  PCP: Windy Kalata, MD  Admit date: 06/21/2013 Discharge date: 06/25/2013  Time spent: 35 minutes  Recommendations for Outpatient Follow-up:  1. Follow up with GI for Capsule endoscopy 2. Follow up with Dr Juliann Mule for Bone marrow results.   Discharge Diagnoses:     Anemia   ESRD (end stage renal disease) on dialysis   Cocaine abuse   Heme + stool   Moderate malnutrition.   Discharge Condition: Stable.   Diet : Renal diet.   Filed Weights   06/23/13 0656 06/23/13 1000 06/24/13 2100  Weight: 64.6 kg (142 lb 6.7 oz) 62.5 kg (137 lb 12.6 oz) 63.4 kg (139 lb 12.4 oz)  recommendation  History of present illness:  Who is non-complaint with his medical care. He comes in after dialysis complaining of lethargy and weakness. He was able to have all 4 hours of dialysis today(t/Th/sat).  In the ER, He was found to have a Hgb of 5- which is not new to him. He has had chronic anemia and in the past lives around 5 (last ER visit was 06/12/13-given 2 units PRBC and nephro recommended short stay for further transfusions- PA in ER was not comfortable with this plan).  He has had GI work up by Dr. Henrene Pastor including scopes (gastritis,polyp) - next step was to be capsule endoscopy--- saw Dr. Henrene Pastor in 2/15.  Patient was also seen by Dr. Juliann Mule in the fall of 2014- he had a BMB that showed a minor B-cell population. Patient never followed back up.  Patient denies alcohol but reports that he may have used crack cocaine (smoked) in the past few weeks.  No SOB, no chest pain- just fatigue. No blood in stools per patient   Hospital Course:  1-Anemia- chronic- ?etiology ESRD vs heme + stools vs hematologic malignancy.  Had BMB that showed minor B-cell population. Dr Juliann Mule recommended to repeat  BM biopsy due to concern for follicular lymphoma. [name] S/P 2 units PRBC 3-5. Received 2 more units of PRBC 3-7 with dialysis.  Heme + stools- chronic issues. Dr Osie Bond with Dr Henrene Pastor nothing from GI standpoint to do in hospital; if patient would allow he would do outpatient capsule endoscopy. Hb at 8 day prior to discharge.  I Have explain to patient that he needs to follow up with dr Juliann Mule for further therapy.  Patient has transportation available for appointments for  follow up.   2-cocaine abuse- encourage cessation  3-ESRD- HD t/th/sat- renal following.  4-History of Seizure; continue with Keppra.    Procedures:  Dialysis.   Bone Marrow results.   Consultations:  Dr Juliann Mule  Renal  Discharge Exam: Filed Vitals:   06/25/13 0935  BP: 173/72  Pulse: 81  Temp:   Resp: 18    General: No distress.  Cardiovascular: S 1, S 2 RRR Respiratory: CTA  Discharge Instructions  Discharge Orders   Future Orders Complete By Expires   Diet - low sodium heart healthy  As directed    Increase activity slowly  As directed        Medication List         calcium acetate 667 MG capsule  Commonly known as:  PHOSLO  Take 667-2,001 mg by mouth See admin instructions. Take 3 capsules (2,001mg ) three times a day with meals and take 1 capsule a day with snacks     folic acid 1 MG tablet  Commonly known as:  FOLVITE  Take 1 mg by mouth daily.     hydrALAZINE 100 MG tablet  Commonly known as:  APRESOLINE  Take 100 mg by mouth 2 (two) times daily.     levETIRAcetam 1000 MG tablet  Commonly known as:  KEPPRA  Take 1,000 mg by mouth 2 (two) times daily.     pantoprazole 40 MG tablet  Commonly known as:  PROTONIX  Take 40 mg by mouth daily.       Allergies  Allergen Reactions  . Reglan [Metoclopramide] Other (See Comments)    Tardive dyskinesia in 11/2011 in [place]   Follow up with CHISM, DAVID, MD In 1 week.   Specialty:  Internal Medicine   Contact information:   [place] [address] [phone]       Follow up with Scarlette Shorts, MD.   Specialty:   Gastroenterology   Contact information:   [address] [place] [address] [phone]        The results of significant diagnostics from this hospitalization (including imaging, microbiology, ancillary and laboratory) are listed below for reference.    Significant Diagnostic Studies: Ct Biopsy  06/25/2013   CLINICAL DATA:  Anemia  EXAM: CT-GUIDED BONE MARROW ASPIRATE AND CORE BIOPSY.  MEDICATIONS AND MEDICAL HISTORY: Versed 1 mg, Fentanyl 50 mcg.  Additional Medications: None.  ANESTHESIA/SEDATION: Moderate sedation time: 13 minutes  PROCEDURE: The procedure, risks, benefits, and alternatives were explained to the patient. Questions regarding the procedure were encouraged and answered. The patient understands and consents to the procedure.  The back was prepped with Betadine in a sterile fashion, and a sterile drape was applied covering the operative field. A sterile gown and sterile gloves were used for the procedure.  Under CT guidance, an 11 gauge needle was inserted into the right iliac bone via posterior approach. Aspirates and a core were obtain. Final imaging was performed.  Patient tolerated the procedure well without complication. Vital sign monitoring by nursing staff during the procedure will continue as patient is in the special procedures unit for post procedure observation.  FINDINGS: The images document guide needle placement within the right iliac bone via posterior approach. Post biopsy images demonstrate no hemorrhage.  IMPRESSION: Successful CT-guided right iliac bone marrow aspirate and core.   Electronically Signed   By: Maryclare Bean M.D.   On: 06/25/2013 10:39    Microbiology: No results found for this or any previous visit (from the past 240 hour(s)).   Labs: Basic Metabolic Panel:  Recent Labs Lab 06/21/13 1333 06/23/13 0659  NA 139 137  K 3.5* 5.2  CL 95* 95*  CO2  --  25  GLUCOSE 111* 123*  BUN 42* 88*  CREATININE 4.20* 7.40*  CALCIUM  --  7.5*  PHOS   --  5.1*   Liver Function Tests:  Recent Labs Lab 06/21/13 1323 06/23/13 0659  BILITOT 0.4  --   ALBUMIN  --  2.5*   No results found for this basename: LIPASE, AMYLASE,  in the last 168 hours No results found for this basename: AMMONIA,  in the last 168 hours CBC:  Recent Labs Lab 06/21/13 1309 06/21/13 1333 06/22/13 1050 06/23/13 0659 06/24/13 0536  WBC 4.4  --  5.6 5.7 6.9  NEUTROABS 3.2  --   --   --   --   HGB 5.3* 5.8* 7.0* 6.8* 8.4*  HCT 16.6* 17.0* 21.9* 21.1* 26.1*  MCV  84.7  --  87.6 87.9 89.7  PLT 236  --  244 225 232   Cardiac Enzymes: No results found for this basename: CKTOTAL, CKMB, CKMBINDEX, TROPONINI,  in the last 168 hours BNP: BNP (last 3 results) No results found for this basename: PROBNP,  in the last 8760 hours CBG: No results found for this basename: GLUCAP,  in the last 168 hours     Signed:  Gwenyth Bouillon A Regalado  [facility] 06/25/2013, 8:42 PM

## 2013-06-25 NOTE — Progress Notes (Signed)
Medical Oncology:  Patient being discharged as I approached.  He will follow-up with me in the office to discuss results of his bone marrow biopsy.  Concha Norway, MD

## 2013-06-25 NOTE — Progress Notes (Signed)
Patient home/cell  Number [phone].,  Alternate number   [phone] a friend in [place].

## 2013-06-25 NOTE — Progress Notes (Signed)
  [facility] Progress Note   Subjective: Had BM bx this am in IR.  No complaints, wants to eat  Filed Vitals:   06/25/13 0920 06/25/13 0925 06/25/13 0930 06/25/13 0935  BP: 181/74 175/74 177/72 173/72  Pulse: 81 82 82 81  Temp:      TempSrc:      Resp: 16 20 20 18   Height:      Weight:      SpO2: 98% 99% 99% 94%   Exam: Alert, no distress No jvd Clear lungs bilat RRR no MRG Abd soft, nt, nd No leg or UE edema LUA fistula patent Neuro is nf, ox3   Dialysis: TTS [facility] 4h  59kg   2/2.25 [place]   Heparin 1000   AVF LUA Hectorol 4   EPO 20K   Venofer 100 x 10 thru 3/24  Assessment/Plan: 1 Anemia- unclear cause, s/p prbc's and BM Bx this am 2 ESRD poor compliance with outpt HD 3 DM 2 4 Hep C 5 Seizure d/o 6 Hx cocaine abuse 7 MBD cont binder, vit D, pth in range 8 HTN cont hydralazine, up 3-4kg 9 Dispo per primary    Kelly Splinter MD  pager [phone]    cell [phone]  06/25/2013, 1:48 PM     Recent Labs Lab 06/21/13 1333 06/23/13 0659  NA 139 137  K 3.5* 5.2  CL 95* 95*  CO2  --  25  GLUCOSE 111* 123*  BUN 42* 88*  CREATININE 4.20* 7.40*  CALCIUM  --  7.5*  PHOS  --  5.1*    Recent Labs Lab 06/21/13 1323 06/23/13 0659  BILITOT 0.4  --   ALBUMIN  --  2.5*    Recent Labs Lab 06/21/13 1309  06/22/13 1050 06/23/13 0659 06/24/13 0536  WBC 4.4  --  5.6 5.7 6.9  NEUTROABS 3.2  --   --   --   --   HGB 5.3*  < > 7.0* 6.8* 8.4*  HCT 16.6*  < > 21.9* 21.1* 26.1*  MCV 84.7  --  87.6 87.9 89.7  PLT 236  --  244 225 232  < > = values in this interval not displayed. . calcium acetate  667-2,001 mg Oral See admin instructions  . folic acid  1 mg Oral Daily  . hydrALAZINE  100 mg Oral BID  . levETIRAcetam  1,000 mg Oral BID  . midazolam      . pantoprazole  40 mg Oral Daily  . sodium chloride  3 mL Intravenous Q12H  . sodium chloride  3 mL Intravenous Q12H     sodium chloride, acetaminophen, ondansetron (ZOFRAN) IV,  ondansetron, sodium chloride, zolpidem

## 2013-06-26 ENCOUNTER — Other Ambulatory Visit: Payer: Self-pay | Admitting: Internal Medicine

## 2013-06-26 ENCOUNTER — Telehealth: Payer: Self-pay | Admitting: Internal Medicine

## 2013-06-26 DIAGNOSIS — D649 Anemia, unspecified: Secondary | ICD-10-CM

## 2013-06-26 NOTE — Clinical Social Work Note (Signed)
Patient discharged home 06/25/13 and was provided with cab voucher by CSW to transport home.  Etha Stambaugh [name], MSW, LCSW [phone]

## 2013-06-26 NOTE — Telephone Encounter (Signed)
, °

## 2013-06-29 ENCOUNTER — Other Ambulatory Visit: Payer: Medicare Other

## 2013-07-02 LAB — CHROMOSOME ANALYSIS, BONE MARROW

## 2013-07-03 ENCOUNTER — Other Ambulatory Visit: Payer: Medicare Other

## 2013-07-03 ENCOUNTER — Ambulatory Visit: Payer: Medicare Other

## 2013-07-04 ENCOUNTER — Encounter (HOSPITAL_COMMUNITY): Payer: Self-pay

## 2013-07-07 ENCOUNTER — Inpatient Hospital Stay (HOSPITAL_COMMUNITY)
Admission: EM | Admit: 2013-07-07 | Discharge: 2013-07-08 | DRG: 377 | Disposition: A | Discharge status: Home / Self Care | Payer: Medicare Other | Attending: Internal Medicine | Admitting: Internal Medicine

## 2013-07-07 ENCOUNTER — Encounter (HOSPITAL_COMMUNITY): Payer: Self-pay | Admitting: Emergency Medicine

## 2013-07-07 DIAGNOSIS — Z823 Family history of stroke: Secondary | ICD-10-CM | POA: Diagnosis not present

## 2013-07-07 DIAGNOSIS — Z8249 Family history of ischemic heart disease and other diseases of the circulatory system: Secondary | ICD-10-CM

## 2013-07-07 DIAGNOSIS — Z833 Family history of diabetes mellitus: Secondary | ICD-10-CM | POA: Diagnosis not present

## 2013-07-07 DIAGNOSIS — Z841 Family history of disorders of kidney and ureter: Secondary | ICD-10-CM

## 2013-07-07 DIAGNOSIS — E119 Type 2 diabetes mellitus without complications: Secondary | ICD-10-CM | POA: Diagnosis present

## 2013-07-07 DIAGNOSIS — R531 Weakness: Secondary | ICD-10-CM

## 2013-07-07 DIAGNOSIS — R195 Other fecal abnormalities: Secondary | ICD-10-CM

## 2013-07-07 DIAGNOSIS — I5030 Unspecified diastolic (congestive) heart failure: Secondary | ICD-10-CM | POA: Diagnosis present

## 2013-07-07 DIAGNOSIS — I120 Hypertensive chronic kidney disease with stage 5 chronic kidney disease or end stage renal disease: Secondary | ICD-10-CM | POA: Diagnosis present

## 2013-07-07 DIAGNOSIS — I10 Essential (primary) hypertension: Secondary | ICD-10-CM

## 2013-07-07 DIAGNOSIS — Z720 Tobacco use: Secondary | ICD-10-CM

## 2013-07-07 DIAGNOSIS — Z96659 Presence of unspecified artificial knee joint: Secondary | ICD-10-CM | POA: Diagnosis not present

## 2013-07-07 DIAGNOSIS — Z79899 Other long term (current) drug therapy: Secondary | ICD-10-CM

## 2013-07-07 DIAGNOSIS — I509 Heart failure, unspecified: Secondary | ICD-10-CM | POA: Diagnosis present

## 2013-07-07 DIAGNOSIS — Z992 Dependence on renal dialysis: Secondary | ICD-10-CM

## 2013-07-07 DIAGNOSIS — D638 Anemia in other chronic diseases classified elsewhere: Secondary | ICD-10-CM | POA: Diagnosis present

## 2013-07-07 DIAGNOSIS — F1410 Cocaine abuse, uncomplicated: Secondary | ICD-10-CM | POA: Diagnosis present

## 2013-07-07 DIAGNOSIS — F17200 Nicotine dependence, unspecified, uncomplicated: Secondary | ICD-10-CM | POA: Diagnosis present

## 2013-07-07 DIAGNOSIS — R5383 Other fatigue: Secondary | ICD-10-CM | POA: Diagnosis present

## 2013-07-07 DIAGNOSIS — K625 Hemorrhage of anus and rectum: Secondary | ICD-10-CM

## 2013-07-07 DIAGNOSIS — N186 End stage renal disease: Secondary | ICD-10-CM | POA: Diagnosis present

## 2013-07-07 DIAGNOSIS — G40909 Epilepsy, unspecified, not intractable, without status epilepticus: Secondary | ICD-10-CM

## 2013-07-07 DIAGNOSIS — R5381 Other malaise: Secondary | ICD-10-CM | POA: Diagnosis present

## 2013-07-07 DIAGNOSIS — K922 Gastrointestinal hemorrhage, unspecified: Principal | ICD-10-CM

## 2013-07-07 DIAGNOSIS — E440 Moderate protein-calorie malnutrition: Secondary | ICD-10-CM

## 2013-07-07 DIAGNOSIS — E8770 Fluid overload, unspecified: Secondary | ICD-10-CM

## 2013-07-07 DIAGNOSIS — D649 Anemia, unspecified: Secondary | ICD-10-CM

## 2013-07-07 LAB — COMPREHENSIVE METABOLIC PANEL
ALT: 11 U/L (ref 0–53)
AST: 17 U/L (ref 0–37)
Albumin: 2.9 g/dL — ABNORMAL LOW (ref 3.5–5.2)
Alkaline Phosphatase: 60 U/L (ref 39–117)
BILIRUBIN TOTAL: 0.3 mg/dL (ref 0.3–1.2)
BUN: 16 mg/dL (ref 6–23)
CHLORIDE: 95 meq/L — AB (ref 96–112)
CO2: 33 meq/L — AB (ref 19–32)
CREATININE: 3.16 mg/dL — AB (ref 0.50–1.35)
Calcium: 8.6 mg/dL (ref 8.4–10.5)
GFR calc Af Amer: 24 mL/min — ABNORMAL LOW (ref >90)
GFR, EST NON AFRICAN AMERICAN: 21 mL/min — AB (ref >90)
Glucose, Bld: 108 mg/dL — ABNORMAL HIGH (ref 70–99)
POTASSIUM: 3.7 meq/L (ref 3.7–5.3)
Sodium: 139 mEq/L (ref 137–147)
Total Protein: 7.7 g/dL (ref 6.0–8.3)

## 2013-07-07 LAB — APTT
aPTT: 24 seconds (ref 24–37)
aPTT: 36 seconds (ref 24–37)

## 2013-07-07 LAB — CBC
HEMATOCRIT: 24.8 % — AB (ref 39.0–52.0)
Hemoglobin: 7.4 g/dL — ABNORMAL LOW (ref 13.0–17.0)
MCH: 27.5 pg (ref 26.0–34.0)
MCHC: 29.8 g/dL — ABNORMAL LOW (ref 30.0–36.0)
MCV: 92.2 fL (ref 78.0–100.0)
PLATELETS: 248 10*3/uL (ref 150–400)
RBC: 2.69 MIL/uL — AB (ref 4.22–5.81)
RDW: 17.6 % — AB (ref 11.5–15.5)
WBC: 3.9 10*3/uL — AB (ref 4.0–10.5)

## 2013-07-07 LAB — PROTIME-INR
INR: 0.95 (ref 0.00–1.49)
PROTHROMBIN TIME: 12.5 s (ref 11.6–15.2)

## 2013-07-07 LAB — PREPARE RBC (CROSSMATCH)

## 2013-07-07 LAB — POC OCCULT BLOOD, ED: Fecal Occult Bld: POSITIVE — AB

## 2013-07-07 MED ORDER — FOLIC ACID 1 MG PO TABS
1.0000 mg | ORAL_TABLET | Freq: Every day | ORAL | Status: DC
  Administered 2013-07-08: 1 mg via ORAL
  Filled 2013-07-07 (×2): qty 1

## 2013-07-07 MED ORDER — PANTOPRAZOLE SODIUM 40 MG PO TBEC
40.0000 mg | DELAYED_RELEASE_TABLET | Freq: Every day | ORAL | Status: DC
  Administered 2013-07-08: 40 mg via ORAL
  Filled 2013-07-07: qty 1

## 2013-07-07 MED ORDER — POLYETHYLENE GLYCOL 3350 17 G PO PACK
17.0000 g | PACK | Freq: Every day | ORAL | Status: DC | PRN
  Filled 2013-07-07: qty 1

## 2013-07-07 MED ORDER — FUROSEMIDE 10 MG/ML IJ SOLN: 20.0000 mg | Freq: Once | INTRAMUSCULAR | Status: DC

## 2013-07-07 MED ORDER — LEVETIRACETAM 500 MG PO TABS
1000.0000 mg | ORAL_TABLET | Freq: Two times a day (BID) | ORAL | Status: DC
  Administered 2013-07-07 – 2013-07-08 (×2): 1000 mg via ORAL
  Filled 2013-07-07 (×3): qty 2

## 2013-07-07 MED ORDER — SODIUM CHLORIDE 0.9 % IJ SOLN
3.0000 mL | Freq: Two times a day (BID) | INTRAMUSCULAR | Status: DC
  Administered 2013-07-07 – 2013-07-08 (×2): 3 mL via INTRAVENOUS

## 2013-07-07 MED ORDER — SODIUM CHLORIDE 0.9 % IV SOLN: 250.0000 mL | INTRAVENOUS | Status: DC | PRN

## 2013-07-07 MED ORDER — CALCIUM ACETATE 667 MG PO CAPS
667.0000 mg | ORAL_CAPSULE | ORAL | Status: DC | PRN
  Filled 2013-07-07: qty 1

## 2013-07-07 MED ORDER — ONDANSETRON HCL 4 MG/2ML IJ SOLN: 4.0000 mg | Freq: Four times a day (QID) | INTRAMUSCULAR | Status: DC | PRN

## 2013-07-07 MED ORDER — ACETAMINOPHEN 325 MG PO TABS
650.0000 mg | ORAL_TABLET | Freq: Four times a day (QID) | ORAL | Status: DC | PRN
  Administered 2013-07-07: 650 mg via ORAL
  Filled 2013-07-07: qty 2

## 2013-07-07 MED ORDER — ACETAMINOPHEN 650 MG RE SUPP: 650.0000 mg | Freq: Four times a day (QID) | RECTAL | Status: DC | PRN

## 2013-07-07 MED ORDER — HYDRALAZINE HCL 100 MG PO TABS
100.0000 mg | ORAL_TABLET | Freq: Two times a day (BID) | ORAL | Status: DC
  Administered 2013-07-07 – 2013-07-08 (×2): 100 mg via ORAL
  Filled 2013-07-07 (×3): qty 1

## 2013-07-07 MED ORDER — SODIUM CHLORIDE 0.9 % IJ SOLN: 3.0000 mL | INTRAMUSCULAR | Status: DC | PRN

## 2013-07-07 MED ORDER — CALCIUM ACETATE 667 MG PO CAPS
2001.0000 mg | ORAL_CAPSULE | Freq: Three times a day (TID) | ORAL | Status: DC
  Administered 2013-07-08 (×2): 2001 mg via ORAL
  Filled 2013-07-07 (×5): qty 3

## 2013-07-07 MED ORDER — ONDANSETRON HCL 4 MG PO TABS: 4.0000 mg | ORAL_TABLET | Freq: Four times a day (QID) | ORAL | Status: DC | PRN

## 2013-07-07 NOTE — ED Notes (Signed)
Pt signed blood transfusion consent form. 

## 2013-07-07 NOTE — ED Notes (Signed)
Registration at bedside.

## 2013-07-07 NOTE — H&P (Signed)
[facility] History and Physical  [name] ZCH:[id number] DOB: [birth year] DOA: 07/07/2013  Referring physician: Tamera Punt PCP: Windy Kalata, MD   Chief Complaint: weakness  HPI: [name] is a 58 y.o. male  Past medical history of chronic renal disease dialyzes Tuesday Thursday and Saturdays, hypertension diabetes cocaine abuse anemia GI bleed recently discharged from the hospital and currently being worked up as an outpatient that comes in for weakness. The patient relates he was at dialysis they checked a hemoglobin and sent here.  In the ED: Hbg 7.5 FOBT was positive he denies any melena.   Review of Systems:  Constitutional:  No weight loss, night sweats, Fevers, chills, fatigue.  HEENT:  No headaches, Difficulty swallowing,Tooth/dental problems,Sore throat,  No sneezing, itching, ear ache, nasal congestion, post nasal drip,  Cardio-vascular:  No chest pain, Orthopnea, PND, swelling in lower extremities, anasarca, dizziness, palpitations  GI:  No heartburn, indigestion, abdominal pain, nausea, vomiting, diarrhea, change in bowel habits, loss of appetite  Resp:  No shortness of breath with exertion or at rest. No excess mucus, no productive cough, No non-productive cough, No coughing up of blood.No change in color of mucus.No wheezing.No chest wall deformity  Skin:  no rash or lesions.  GU:  no dysuria, change in color of urine, no urgency or frequency. No flank pain.  Musculoskeletal:  No joint pain or swelling. No decreased range of motion. No back pain.  Psych:  No change in mood or affect. No depression or anxiety. No memory loss.   Past Medical History  Diagnosis Date  . Hypertension   . Anemia, chronic disease   . CHF (congestive heart failure)     diastolic.  EF 60 - 65% per [facility] eco 11/2011  . Cocaine abuse     mentioned in notes from [place]  . Hepatitis C antibody test positive     was HIV negative, 02/28/12  . Hepatitis B core  antibody positive     03/01/10  . Positive QuantiFERON-TB Gold test     11/2011  . Helicobacter pylori gastritis     not defined if this was treated  . Polyp of colon, adenomatous     May 2012.  Dr Trenton Founds in [place]  . Shortness of breath   . Hematochezia   . Head injury, closed, with concussion   . History of blood transfusion     "last one was 2 days ago" (11/09/2012)  . Type II diabetes mellitus     on oral pills only  . Arthritis     "right shoulder" (11/09/2012)  . Seizure disorder     questionable history of - will need to clarify with PCP  . Headache(784.0)     "q other day" (11/09/2012)  . ESRD (end stage renal disease) on dialysis since 2012    "[place]; TTS" (11/09/2012)  . Hypercalcemia   . Hyperpotassemia   . Seizures    Past Surgical History  Procedure Laterality Date  . Shoulder open rotator cuff repair Right   . Total knee arthroplasty Left   . Bascilic vein transposition  03/07/2012    Procedure: BASCILIC VEIN TRANSPOSITION;  Surgeon: Conrad Montgomery, MD;  Location: [facility];  Service: Vascular;  Laterality: Left;  First Stage  . Insertion of dialysis catheter      right chest  . Bascilic vein transposition Left 05/31/2012    Procedure: BASCILIC VEIN TRANSPOSITION;  Surgeon: Conrad Elliott, MD;  Location: [facility];  Service: Vascular;  Laterality: Left;  Left 2nd Stage Basilic Vein Transposition with gortex graft revision using 75mmx10cm graft  . Shoulder open rotator cuff repair Right    Social History:  reports that he has been smoking Cigarettes.  He has a 6.25 pack-year smoking history. He has never used smokeless tobacco. He reports that he does not drink alcohol or use illicit drugs.  Allergies  Allergen Reactions  . Reglan [Metoclopramide] Other (See Comments)    Tardive dyskinesia in 11/2011 in [place]    Family History  Problem Relation Age of Onset  . Diabetes Mother   . Hypertension Mother   . Stroke Mother   . Kidney failure Mother   . Cancer  Father      Prior to Admission medications   Medication Sig Start Date End Date Taking? Authorizing Provider  calcium acetate (PHOSLO) 667 MG capsule Take 667-2,001 mg by mouth See admin instructions. Take 3 capsules (2,001mg ) three times a day with meals and take 1 capsule a day with snacks   Yes Historical Provider, MD  folic acid (FOLVITE) 1 MG tablet Take 1 mg by mouth daily.   Yes Historical Provider, MD  hydrALAZINE (APRESOLINE) 100 MG tablet Take 100 mg by mouth 2 (two) times daily.   Yes Historical Provider, MD  levETIRAcetam (KEPPRA) 1000 MG tablet Take 1,000 mg by mouth 2 (two) times daily.   Yes Historical Provider, MD  pantoprazole (PROTONIX) 40 MG tablet Take 40 mg by mouth daily.   Yes Historical Provider, MD   Physical Exam: Filed Vitals:   07/07/13 1547  BP: 184/67  Pulse:   Resp: 23    BP 184/67  Pulse 74  Resp 23  Ht 5\' 9"  (1.753 m)  Wt 61.236 kg (135 lb)  BMI 19.93 kg/m2  SpO2 92%  General:  Appears calm and comfortable Eyes: PERRL, normal lids, irises & conjunctiva ENT: grossly normal hearing, lips & tongue Neck: no LAD, masses or thyromegaly positive JVD Cardiovascular: RRR, no m/r/g. No LE edema. Telemetry: SR, no arrhythmias  Respiratory: Crackles on the right side, no w/r/r. Normal respiratory effort. Abdomen: soft, ntnd Skin: no rash or induration seen on limited exam Musculoskeletal: grossly normal tone BUE/BLE Psychiatric: grossly normal mood and affect, speech fluent and appropriate Neurologic: grossly non-focal.          Labs on Admission:  Basic Metabolic Panel:  Recent Labs Lab 07/07/13 1137  NA 139  K 3.7  CL 95*  CO2 33*  GLUCOSE 108*  BUN 16  CREATININE 3.16*  CALCIUM 8.6   Liver Function Tests:  Recent Labs Lab 07/07/13 1137  AST 17  ALT 11  ALKPHOS 60  BILITOT 0.3  PROT 7.7  ALBUMIN 2.9*   No results found for this basename: LIPASE, AMYLASE,  in the last 168 hours No results found for this basename: AMMONIA,  in  the last 168 hours CBC:  Recent Labs Lab 07/07/13 1137  WBC 3.9*  HGB 7.4*  HCT 24.8*  MCV 92.2  PLT 248   Cardiac Enzymes: No results found for this basename: CKTOTAL, CKMB, CKMBINDEX, TROPONINI,  in the last 168 hours  BNP (last 3 results) No results found for this basename: PROBNP,  in the last 8760 hours CBG: No results found for this basename: GLUCAP,  in the last 168 hours  Radiological Exams on Admission: No results found.  EKG: Independently reviewed.   Assessment/Plan GI bleed/  Anemia - Transfuse 1 unit of packed red blood cell consult renal for  dialysis, he has JVD crackles on the right. - No melena hematemesis or bright red blood per rectum, but FOBT positive.  Diabetes mellitus without complication Continue current medications.  ESRD (end stage renal disease) on dialysis: - Consider renal for HD. His transfusion until we did get dialysis colon, as he does have positive JVD and right-sided crackles on the lungs.  Volume overload - Unclear etiology he relates he got his hemodialysis treatment today.  Cocaine abuse - Check a UDS.   Code Status: full Family Communication: none Disposition Plan: inpatient  Time spent: 70 minutes  Charlynne Cousins [facility] Pager [phone]

## 2013-07-07 NOTE — ED Notes (Signed)
Per EMS, from dialysis center (dialyzed this morning).  Hgb 6, so sent over for transfusion.  Pt has had multiple issues with hgb throughout tx.  Has been on dialysis for 2 years.  Shunt in left arm.  No other complaints except weakness.  Hx hypertension (187/79), P 80, R 16.  Pt denies pain.    Per patient, states weakness feels similar as when he had hgb before.  Denies blood thinners.

## 2013-07-07 NOTE — ED Provider Notes (Signed)
CSN: [id number]     Arrival date & time 07/07/13  1108 History   First MD Initiated Contact with Patient 07/07/13 1113     Chief Complaint  Patient presents with  . Abnormal Lab     (Consider location/radiation/quality/duration/timing/severity/associated sxs/prior Treatment) HPI Comments: [name] is a 58 y.o. male with a past medical history of CKD, Anemia, HTN, DM, GI bleed presenting the Emergency Department with a chief complaint of abnormal lab value.  The pateint reports he was at dialysis today and was told his Hbg was 6.6.  He completed his full 4 hours of dialysis.  He reports dialysis schedule is Tues/Thurs/Saturday.  He reports generalized weakness onset today.  Denies SOB, Chest pain, lightheadedness, headache, nausea, vomiting. BRBPR, melenotic stool.   The history is provided by the patient and medical records. No language interpreter was used.    Past Medical History  Diagnosis Date  . Hypertension   . Anemia, chronic disease   . CHF (congestive heart failure)     diastolic.  EF 60 - 65% per [facility] eco 11/2011  . Cocaine abuse     mentioned in notes from [place]  . Hepatitis C antibody test positive     was HIV negative, 02/28/12  . Hepatitis B core antibody positive     03/01/10  . Positive QuantiFERON-TB Gold test     11/2011  . Helicobacter pylori gastritis     not defined if this was treated  . Polyp of colon, adenomatous     May 2012.  Dr Trenton Founds in [place]  . Shortness of breath   . Hematochezia   . Head injury, closed, with concussion   . History of blood transfusion     "last one was 2 days ago" (11/09/2012)  . Type II diabetes mellitus     on oral pills only  . Arthritis     "right shoulder" (11/09/2012)  . Seizure disorder     questionable history of - will need to clarify with PCP  . Headache(784.0)     "q other day" (11/09/2012)  . ESRD (end stage renal disease) on dialysis since 2012    "[place]; TTS" (11/09/2012)  .  Hypercalcemia   . Hyperpotassemia   . Seizures    Past Surgical History  Procedure Laterality Date  . Shoulder open rotator cuff repair Right   . Total knee arthroplasty Left   . Bascilic vein transposition  03/07/2012    Procedure: BASCILIC VEIN TRANSPOSITION;  Surgeon: Conrad Orange [place], MD;  Location: [place];  Service: Vascular;  Laterality: Left;  First Stage  . Insertion of dialysis catheter      right chest  . Bascilic vein transposition Left 05/31/2012    Procedure: BASCILIC VEIN TRANSPOSITION;  Surgeon: Conrad Oakdale, MD;  Location: [place];  Service: Vascular;  Laterality: Left;  Left 2nd Stage Basilic Vein Transposition with gortex graft revision using 37mmx10cm graft  . Shoulder open rotator cuff repair Right    Family History  Problem Relation Age of Onset  . Diabetes Mother   . Hypertension Mother   . Stroke Mother   . Kidney failure Mother   . Cancer Father    History  Substance Use Topics  . Smoking status: Current Every Day Smoker -- 0.25 packs/day for 25 years    Types: Cigarettes  . Smokeless tobacco: Never Used  . Alcohol Use: No     Comment: 11/09/2012 "been stopped drinking 1-2 yr ago"  Review of Systems  Constitutional: Positive for fatigue. Negative for fever and chills.  Cardiovascular: Negative for chest pain and palpitations.  Gastrointestinal: Negative for abdominal pain.  Musculoskeletal: Negative for back pain.  Neurological: Positive for weakness. Negative for dizziness, syncope, light-headedness and headaches.      Allergies  Reglan  Home Medications   Current Outpatient Rx  Name  Route  Sig  Dispense  Refill  . calcium acetate (PHOSLO) 667 MG capsule   Oral   Take 667-2,001 mg by mouth See admin instructions. Take 3 capsules (2,001mg ) three times a day with meals and take 1 capsule a day with snacks         . folic acid (FOLVITE) 1 MG tablet   Oral   Take 1 mg by mouth daily.         . hydrALAZINE (APRESOLINE) 100 MG tablet   Oral    Take 100 mg by mouth 2 (two) times daily.         Marland Kitchen levETIRAcetam (KEPPRA) 1000 MG tablet   Oral   Take 1,000 mg by mouth 2 (two) times daily.         . pantoprazole (PROTONIX) 40 MG tablet   Oral   Take 40 mg by mouth daily.          BP 195/69  Pulse 74  Resp 16  Ht 5\' 9"  (1.753 m)  Wt 135 lb (61.236 kg)  BMI 19.93 kg/m2  SpO2 100% Physical Exam  Nursing note and vitals reviewed. Constitutional: [name] oriented to person, place, and time. He appears well-developed and well-nourished. No distress.  HENT:  Head: Normocephalic and atraumatic.  Eyes: EOM are normal. Pupils are equal, round, and reactive to light. No scleral icterus.  Neck: Neck supple.  Cardiovascular: Normal rate and regular rhythm.   Murmur heard. No lower extremity edema. Left upper extremity Fistula with good palpable thrill.  Pulmonary/Chest: Effort normal and breath sounds normal. No respiratory distress. He has no wheezes. He has no rales.  Abdominal: Soft. Bowel sounds are normal. There is no tenderness. There is no rebound and no guarding.  Musculoskeletal: Normal range of motion. He exhibits no edema.  Moves all 4 extremitites  Neurological: [name] alert and oriented to person, place, and time.  Skin: Skin is warm and dry. No rash noted.  Psychiatric: He has a normal mood and affect. His behavior is normal.    ED Course  Procedures (including critical care time) Labs Review Results for orders placed during the hospital encounter of 07/07/13  CBC      Result Value Ref Range   WBC 3.9 (*) 4.0 - 10.5 K/uL   RBC 2.69 (*) 4.22 - 5.81 MIL/uL   Hemoglobin 7.4 (*) 13.0 - 17.0 g/dL   HCT 24.8 (*) 39.0 - 52.0 %   MCV 92.2  78.0 - 100.0 fL   MCH 27.5  26.0 - 34.0 pg   MCHC 29.8 (*) 30.0 - 36.0 g/dL   RDW 17.6 (*) 11.5 - 15.5 %   Platelets 248  150 - 400 K/uL  COMPREHENSIVE METABOLIC PANEL      Result Value Ref Range   Sodium 139  137 - 147 mEq/L   Potassium 3.7  3.7 - 5.3 mEq/L   Chloride 95  (*) 96 - 112 mEq/L   CO2 33 (*) 19 - 32 mEq/L   Glucose, Bld 108 (*) 70 - 99 mg/dL   BUN 16  6 - 23 mg/dL  Creatinine, Ser 3.16 (*) 0.50 - 1.35 mg/dL   Calcium 8.6  8.4 - 10.5 mg/dL   Total Protein 7.7  6.0 - 8.3 g/dL   Albumin 2.9 (*) 3.5 - 5.2 g/dL   AST 17  0 - 37 U/L   ALT 11  0 - 53 U/L   Alkaline Phosphatase 60  39 - 117 U/L   Total Bilirubin 0.3  0.3 - 1.2 mg/dL   GFR calc non Af Amer 21 (*) >90 mL/min   GFR calc Af Amer 24 (*) >90 mL/min  PROTIME-INR      Result Value Ref Range   Prothrombin Time 12.5  11.6 - 15.2 seconds   INR 0.95  0.00 - 1.49  APTT      Result Value Ref Range   aPTT 24  24 - 37 seconds  POC OCCULT BLOOD, ED      Result Value Ref Range   Fecal Occult Bld POSITIVE (*) NEGATIVE  TYPE AND SCREEN      Result Value Ref Range   ABO/RH(D) A POS     Antibody Screen NEG     Sample Expiration 07/10/2013     Imaging Review No results found.   EKG Interpretation   Date/Time:  Saturday July 07 2013 11:22:56 EDT Ventricular Rate:  76 PR Interval:  181 QRS Duration: 104 QT Interval:  450 QTC Calculation: 506 R Axis:   36 Text Interpretation:  Age not entered, assumed to be  58 years old for  purpose of ECG interpretation Sinus rhythm Probable left atrial  enlargement Left ventricular hypertrophy Abnormal T, consider ischemia,  lateral leads Prolonged QT interval Baseline wander in lead(s) I II III  aVR aVF V2 V3 V4 V5 V6 since last tracing no significant change Confirmed  by BELFI  MD, MELANIE ([id number]) on 07/07/2013 3:36:07 PM      MDM   Final diagnoses:  Anemia  CKD (chronic kidney disease) requiring chronic dialysis  Weakness  GI bleed   Pt with a history of anemia and multiple transfusions and GI bleed, currently being evaluated as an out pt. Most recent transfusion 3-5 Pt received 2 units and 3-7 Pt received 2 units. Hbg 7.4 today, occult positive.  Discussed with Dr. Tamera Punt who advises calling Dialysis center to discuss current lab results.   1330 Consult to nephology. Multiple pages and calls placed to Dialysis center and paged on-call PA 3 times and had the Dialysis center page her as well.   1522Consult to on-call nephrologist, Posey Pronto suggest admission for transfusion.  1538: Consult to [facility]. Pt admitted for transfusion.     Lorrine Kin, PA-C 07/09/13 [phone]

## 2013-07-07 NOTE — ED Notes (Signed)
Admitting physician at bedside

## 2013-07-08 DIAGNOSIS — R5381 Other malaise: Secondary | ICD-10-CM

## 2013-07-08 DIAGNOSIS — R5383 Other fatigue: Secondary | ICD-10-CM

## 2013-07-08 LAB — CBC
HCT: 25.3 % — ABNORMAL LOW (ref 39.0–52.0)
HEMOGLOBIN: 7.8 g/dL — AB (ref 13.0–17.0)
MCH: 27.9 pg (ref 26.0–34.0)
MCHC: 30.8 g/dL (ref 30.0–36.0)
MCV: 90.4 fL (ref 78.0–100.0)
PLATELETS: 250 10*3/uL (ref 150–400)
RBC: 2.8 MIL/uL — ABNORMAL LOW (ref 4.22–5.81)
RDW: 19.4 % — AB (ref 11.5–15.5)
WBC: 6.2 10*3/uL (ref 4.0–10.5)

## 2013-07-08 LAB — COMPREHENSIVE METABOLIC PANEL
ALBUMIN: 2.6 g/dL — AB (ref 3.5–5.2)
ALK PHOS: 56 U/L (ref 39–117)
ALT: 10 U/L (ref 0–53)
AST: 17 U/L (ref 0–37)
BUN: 32 mg/dL — ABNORMAL HIGH (ref 6–23)
CALCIUM: 8.2 mg/dL — AB (ref 8.4–10.5)
CO2: 32 mEq/L (ref 19–32)
Chloride: 96 mEq/L (ref 96–112)
Creatinine, Ser: 5.27 mg/dL — ABNORMAL HIGH (ref 0.50–1.35)
GFR calc non Af Amer: 11 mL/min — ABNORMAL LOW (ref >90)
GFR, EST AFRICAN AMERICAN: 13 mL/min — AB (ref >90)
GLUCOSE: 101 mg/dL — AB (ref 70–99)
POTASSIUM: 4.5 meq/L (ref 3.7–5.3)
SODIUM: 138 meq/L (ref 137–147)
TOTAL PROTEIN: 7.1 g/dL (ref 6.0–8.3)
Total Bilirubin: 0.6 mg/dL (ref 0.3–1.2)

## 2013-07-08 LAB — TYPE AND SCREEN
ABO/RH(D): A POS
Antibody Screen: NEGATIVE
Unit division: 0

## 2013-07-08 MED ORDER — ZOLPIDEM TARTRATE 5 MG PO TABS
5.0000 mg | ORAL_TABLET | Freq: Once | ORAL | Status: AC
  Administered 2013-07-08: 5 mg via ORAL
  Filled 2013-07-08: qty 1

## 2013-07-08 NOTE — Progress Notes (Signed)
Patient discharge teaching given, including activity, diet, follow-up appoints, and medications. Patient verbalized understanding of all discharge instructions. IV access was d/c'd. Vitals are stable. Skin is intact except as charted in most recent assessments. Pt to be escorted out by NT, to be driven home by taxi.  Jillyn Ledger, MBA, BS, RN

## 2013-07-08 NOTE — Progress Notes (Signed)
Clinical Education officer, museum (CSW) received call from RN stating that patient needs a ride home and can't take the bus because it does not come by his house. RN stated she tried to call the person listed on the patient's contacts however it was not the correct person. RN stated that patient reported that he does not have any friends or family that can take him home. Per RN patient is medically stable to ride a taxi home. CSW gave patient a taxi voucher, RN verified the patient's address with the patient. Please reconsult if future social work needs arise. CSW signing off.   Blima Rich, [place] CSW [phone]

## 2013-07-08 NOTE — Progress Notes (Signed)
Utilization review completed.  

## 2013-07-08 NOTE — Discharge Summary (Signed)
Physician Discharge Summary  [name] OEU:[id number] DOB: 12/31/1955 DOA: 07/07/2013  PCP: Windy Kalata, MD  Admit date: 07/07/2013 Discharge date: 07/08/2013  Time spent: >30 minutes  Recommendations for Outpatient Follow-up:  Follow-up Information   Follow up with Windy Kalata, MD. Candace Gallus as scheduled)    Specialty:  Nephrology   Contact information:   [place] [address] [phone]       Follow up with Scarlette Shorts, MD. (next week, call for appt upon discharge)    Specialty:  Gastroenterology   Contact information:   [address] [place] [address] [phone]       Follow up with CHISM, DAVID, MD. (in 1-2weeks, call for appt upon discharge)    Specialty:  Internal Medicine   Contact information:   [place] [address] [phone]        Discharge Diagnoses:  Active Problems:   Diabetes mellitus without complication   ESRD (end stage renal disease) on dialysis   Volume overload   Cocaine abuse   GI bleed   Anemia   Discharge Condition: Improved/stable  Diet recommendation: Renal  Filed Weights   07/07/13 1118  Weight: 61.236 kg (135 lb)    History of present illness:  Pt is a 58 y.o. Male with Past medical history of chronic renal disease dialyzes Tuesday Thursday and Saturdays, hypertension diabetes cocaine abuse anemia GI bleed recently discharged from the hospital and currently being worked up as an outpatient that comes in for weakness. The patient relates he was at dialysis they checked a hemoglobin and it was 6.5 and sent here for further eval and management.   Hospital Course:  GI bleed/ Anemia  -As discussed above-it wwas noted that he had No melena hematemesis or bright red blood per rectum, but FOBT positive. His post dialysis hemoglobin upon admission was 7.4, was 6.5 predialysis. He was transfused one unit of packed red blood cells and his hemoglobin improved to 7.8 this morning   -Consulted GI and spoke with Dr. Silvio Pate who reviewed the patient's past workup [facility], and recommends a that patient needs to follow up outpatient with Dr. Henrene Pastor for the capsule endoscopy as as previously planned>> discussed with patient and his agreeable to this -he is also to followup with hematology/Dr. Juliann Mule for results of his bone marrow and further management as clinically appropriate -Patient is to continue Epogen at dialysis Diabetes mellitus without complication  Continue current medications.  ESRD (end stage renal disease) on dialysis:  -discussed pt with renal and he is to follow up outpt for dialysis on Tues as scheduled -He has no SOB, no evidence of vol overload at this time  Cocaine abuse  - no urine specimen in this dialysis pt for UDS.   Procedures:  none  Consultations:  GI-Dr. Silvio Pate, by phone>> recommend the patient to follow up with Dr. Henrene Pastor outpatient for capsule endoscopy  Discharge Exam: Filed Vitals:   07/08/13 1042  BP: 159/59  Pulse: 77  Temp: 98.7 F (37.1 C)  Resp: 17    Exam:  General: alert & oriented x 3 In NAD Cardiovascular: RRR, nl S1 s2 Respiratory: CTAB Abdomen: soft +BS NT/ND, no masses palpable Extremities: No cyanosis and no edema    Discharge Instructions  Discharge Orders   Future Orders Complete By Expires   Diet renal 60/70-05-21-1198  As directed    Increase activity slowly  As directed        Medication List  calcium acetate 667 MG capsule  Commonly known as:  PHOSLO  Take 667-2,001 mg by mouth See admin instructions. Take 3 capsules (2,001mg ) three times a day with meals and take 1 capsule a day with snacks     folic acid 1 MG tablet  Commonly known as:  FOLVITE  Take 1 mg by mouth daily.     hydrALAZINE 100 MG tablet  Commonly known as:  APRESOLINE  Take 100 mg by mouth 2 (two) times daily.     levETIRAcetam 1000 MG tablet  Commonly known as:  KEPPRA  Take 1,000 mg by mouth 2 (two) times daily.      pantoprazole 40 MG tablet  Commonly known as:  PROTONIX  Take 40 mg by mouth daily.       Allergies  Allergen Reactions  . Reglan [Metoclopramide] Other (See Comments)    Tardive dyskinesia in 11/2011 in [place]   Follow up with Windy Kalata, MD. Candace Gallus as scheduled)    Specialty:  Nephrology   Contact information:   [place] [address] [phone]       Follow up with Scarlette Shorts, MD. (next week, call for appt upon discharge)    Specialty:  Gastroenterology   Contact information:   [address] [place] [address] [phone]       Follow up with CHISM, DAVID, MD. (in 1-2weeks, call for appt upon discharge)    Specialty:  Internal Medicine   Contact information:   [place] [address] [phone]        The results of significant diagnostics from this hospitalization (including imaging, microbiology, ancillary and laboratory) are listed below for reference.    Significant Diagnostic Studies: Ct Biopsy  06/25/2013   CLINICAL DATA:  Anemia  EXAM: CT-GUIDED BONE MARROW ASPIRATE AND CORE BIOPSY.  MEDICATIONS AND MEDICAL HISTORY: Versed 1 mg, Fentanyl 50 mcg.  Additional Medications: None.  ANESTHESIA/SEDATION: Moderate sedation time: 13 minutes  PROCEDURE: The procedure, risks, benefits, and alternatives were explained to the patient. Questions regarding the procedure were encouraged and answered. The patient understands and consents to the procedure.  The back was prepped with Betadine in a sterile fashion, and a sterile drape was applied covering the operative field. A sterile gown and sterile gloves were used for the procedure.  Under CT guidance, an 11 gauge needle was inserted into the right iliac bone via posterior approach. Aspirates and a core were obtain. Final imaging was performed.  Patient tolerated the procedure well without complication. Vital sign monitoring by nursing staff during  the procedure will continue as patient is in the special procedures unit for post procedure observation.  FINDINGS: The images document guide needle placement within the right iliac bone via posterior approach. Post biopsy images demonstrate no hemorrhage.  IMPRESSION: Successful CT-guided right iliac bone marrow aspirate and core.   Electronically Signed   By: Maryclare Bean M.D.   On: 06/25/2013 10:39    Microbiology: No results found for this or any previous visit (from the past 240 hour(s)).   Labs: Basic Metabolic Panel:  Recent Labs Lab 07/07/13 1137 07/08/13 0840  NA 139 138  K 3.7 4.5  CL 95* 96  CO2 33* 32  GLUCOSE 108* 101*  BUN 16 32*  CREATININE 3.16* 5.27*  CALCIUM 8.6 8.2*   Liver Function Tests:  Recent Labs Lab 07/07/13 1137 07/08/13 0840  AST 17 17  ALT 11  10  ALKPHOS 60 56  BILITOT 0.3 0.6  PROT 7.7 7.1  ALBUMIN 2.9* 2.6*   No results found for this basename: LIPASE, AMYLASE,  in the last 168 hours No results found for this basename: AMMONIA,  in the last 168 hours CBC:  Recent Labs Lab 07/07/13 1137 07/08/13 0840  WBC 3.9* 6.2  HGB 7.4* 7.8*  HCT 24.8* 25.3*  MCV 92.2 90.4  PLT 248 250   Cardiac Enzymes: No results found for this basename: CKTOTAL, CKMB, CKMBINDEX, TROPONINI,  in the last 168 hours BNP: BNP (last 3 results) No results found for this basename: PROBNP,  in the last 8760 hours CBG: No results found for this basename: GLUCAP,  in the last 168 hours     Signed:  [facility] 07/08/2013, 1:56 PM

## 2013-07-09 NOTE — ED Provider Notes (Signed)
Medical screening examination/treatment/procedure(s) were conducted as a shared visit with non-physician practitioner(s) and myself.  I personally evaluated the patient during the encounter.   EKG Interpretation   Date/Time:  Saturday July 07 2013 11:22:56 EDT Ventricular Rate:  76 PR Interval:  181 QRS Duration: 104 QT Interval:  450 QTC Calculation: 506 R Axis:   36 Text Interpretation:  Age not entered, assumed to be  58 years old for  purpose of ECG interpretation Sinus rhythm Probable left atrial  enlargement Left ventricular hypertrophy Abnormal T, consider ischemia,  lateral leads Prolonged QT interval Baseline wander in lead(s) I II III  aVR aVF V2 V3 V4 V5 V6 since last tracing no significant change Confirmed  by Ohm Dentler  MD, Salvatore Poe ([id number]) on 07/07/2013 3:36:07 PM        Malvin Johns, MD 07/09/13 1904

## 2013-07-24 ENCOUNTER — Emergency Department (HOSPITAL_COMMUNITY)
Admission: EM | Admit: 2013-07-24 | Discharge: 2013-07-24 | Disposition: A | Discharge status: Home / Self Care | Payer: Medicare Other | Attending: Emergency Medicine | Admitting: Emergency Medicine

## 2013-07-24 ENCOUNTER — Encounter (HOSPITAL_COMMUNITY): Payer: Self-pay | Admitting: Emergency Medicine

## 2013-07-24 ENCOUNTER — Other Ambulatory Visit (HOSPITAL_COMMUNITY): Payer: Self-pay | Admitting: *Deleted

## 2013-07-24 DIAGNOSIS — Z860100 Personal history of colon polyps, unspecified: Secondary | ICD-10-CM | POA: Insufficient documentation

## 2013-07-24 DIAGNOSIS — Z8739 Personal history of other diseases of the musculoskeletal system and connective tissue: Secondary | ICD-10-CM | POA: Insufficient documentation

## 2013-07-24 DIAGNOSIS — N186 End stage renal disease: Secondary | ICD-10-CM | POA: Insufficient documentation

## 2013-07-24 DIAGNOSIS — I5030 Unspecified diastolic (congestive) heart failure: Secondary | ICD-10-CM | POA: Insufficient documentation

## 2013-07-24 DIAGNOSIS — G40909 Epilepsy, unspecified, not intractable, without status epilepticus: Secondary | ICD-10-CM | POA: Insufficient documentation

## 2013-07-24 DIAGNOSIS — R5383 Other fatigue: Secondary | ICD-10-CM

## 2013-07-24 DIAGNOSIS — Z87828 Personal history of other (healed) physical injury and trauma: Secondary | ICD-10-CM | POA: Insufficient documentation

## 2013-07-24 DIAGNOSIS — Z8619 Personal history of other infectious and parasitic diseases: Secondary | ICD-10-CM | POA: Insufficient documentation

## 2013-07-24 DIAGNOSIS — F17200 Nicotine dependence, unspecified, uncomplicated: Secondary | ICD-10-CM | POA: Insufficient documentation

## 2013-07-24 DIAGNOSIS — E119 Type 2 diabetes mellitus without complications: Secondary | ICD-10-CM | POA: Insufficient documentation

## 2013-07-24 DIAGNOSIS — D649 Anemia, unspecified: Secondary | ICD-10-CM

## 2013-07-24 DIAGNOSIS — Z8719 Personal history of other diseases of the digestive system: Secondary | ICD-10-CM | POA: Insufficient documentation

## 2013-07-24 DIAGNOSIS — Z992 Dependence on renal dialysis: Secondary | ICD-10-CM | POA: Insufficient documentation

## 2013-07-24 DIAGNOSIS — Z79899 Other long term (current) drug therapy: Secondary | ICD-10-CM | POA: Insufficient documentation

## 2013-07-24 DIAGNOSIS — R5381 Other malaise: Secondary | ICD-10-CM | POA: Insufficient documentation

## 2013-07-24 DIAGNOSIS — I120 Hypertensive chronic kidney disease with stage 5 chronic kidney disease or end stage renal disease: Secondary | ICD-10-CM | POA: Insufficient documentation

## 2013-07-24 LAB — CBC WITH DIFFERENTIAL/PLATELET
Basophils Absolute: 0.1 10*3/uL (ref 0.0–0.1)
Basophils Relative: 1 % (ref 0–1)
EOS ABS: 0.2 10*3/uL (ref 0.0–0.7)
Eosinophils Relative: 4 % (ref 0–5)
HCT: 21.8 % — ABNORMAL LOW (ref 39.0–52.0)
HEMOGLOBIN: 6.8 g/dL — AB (ref 13.0–17.0)
Lymphocytes Relative: 18 % (ref 12–46)
Lymphs Abs: 0.9 10*3/uL (ref 0.7–4.0)
MCH: 28.1 pg (ref 26.0–34.0)
MCHC: 31.2 g/dL (ref 30.0–36.0)
MCV: 90.1 fL (ref 78.0–100.0)
Monocytes Absolute: 0.6 10*3/uL (ref 0.1–1.0)
Neutro Abs: 3.4 10*3/uL (ref 1.7–7.7)
Platelets: 277 10*3/uL (ref 150–400)
RBC: 2.42 MIL/uL — AB (ref 4.22–5.81)
RDW: 17.2 % — ABNORMAL HIGH (ref 11.5–15.5)
WBC: 5.2 10*3/uL (ref 4.0–10.5)

## 2013-07-24 LAB — PREPARE RBC (CROSSMATCH)

## 2013-07-24 LAB — BASIC METABOLIC PANEL
BUN: 16 mg/dL (ref 6–23)
CO2: 32 mEq/L (ref 19–32)
Calcium: 8.1 mg/dL — ABNORMAL LOW (ref 8.4–10.5)
Chloride: 98 mEq/L (ref 96–112)
Creatinine, Ser: 4.23 mg/dL — ABNORMAL HIGH (ref 0.50–1.35)
GFR calc Af Amer: 17 mL/min — ABNORMAL LOW (ref >90)
GFR, EST NON AFRICAN AMERICAN: 14 mL/min — AB (ref >90)
GLUCOSE: 157 mg/dL — AB (ref 70–99)
POTASSIUM: 3.6 meq/L — AB (ref 3.7–5.3)
Sodium: 142 mEq/L (ref 137–147)

## 2013-07-24 MED ORDER — SODIUM CHLORIDE 0.9 % IV SOLN: INTRAVENOUS | Status: DC

## 2013-07-24 NOTE — ED Notes (Signed)
Dr Zenia Resides and PA made aware of hgb 6.8

## 2013-07-24 NOTE — ED Notes (Signed)
Pt given happy meal and drink

## 2013-07-24 NOTE — ED Notes (Signed)
Hospitalist into see pt to have appointment  ( made by Robin)at short stay tom at 8 am social worker Tamela Oddi [phone]) called for assist in getting pt to and from she will be in

## 2013-07-24 NOTE — Discharge Instructions (Signed)
You have an appointment tomorrow morning at short stay at 9:00 in the morning, it is very important that you go there and at that time for a blood transfusion.

## 2013-07-24 NOTE — ED Notes (Addendum)
Confirmed appointment at Short stay tomorrow for 9 am by Lavern and Patriciaann Clan  Must  Be there by 8 am doris here now to help in transportation

## 2013-07-24 NOTE — ED Notes (Signed)
hospitalist in to see pt.

## 2013-07-24 NOTE — ED Provider Notes (Signed)
CSN: [id number]     Arrival date & time 07/24/13  1135 History   First MD Initiated Contact with Patient 07/24/13 1148     Chief Complaint  Patient presents with  . Abnormal Lab     (Consider location/radiation/quality/duration/timing/severity/associated sxs/prior Treatment) HPI Comments: Patient is a 57 year old male with a past medical history of end-stage renal disease on dialysis at [facility] T,TH, S, hypertension, CHF, hepatitis C, diabetes among multiple other medical problems who presents to the emergency department from dialysis stating he was told his hemoglobin was low. Patient completed his dialysis today and was told to come directly to the emergency department. On April 2, he had a hemoglobin of 6.2 and was told to come to the emergency department, however states he did not do so. States he has been weak and fatigued. Denies chest pain, headache, dizziness, lightheadedness, n/v, bloody stool or shortness of breath out of his normal.  The history is provided by the patient.    Past Medical History  Diagnosis Date  . Hypertension   . Anemia, chronic disease   . CHF (congestive heart failure)     diastolic.  EF 60 - 65% per [facility] eco 11/2011  . Cocaine abuse     mentioned in notes from [facility]  . Hepatitis C antibody test positive     was HIV negative, 02/28/12  . Hepatitis B core antibody positive     03/01/10  . Positive QuantiFERON-TB Gold test     11/2011  . Helicobacter pylori gastritis     not defined if this was treated  . Polyp of colon, adenomatous     May 2012.  Dr Trenton Founds in [place]  . Shortness of breath   . Hematochezia   . Head injury, closed, with concussion   . History of blood transfusion     "last one was 2 days ago" (11/09/2012)  . Type II diabetes mellitus     on oral pills only  . Arthritis     "right shoulder" (11/09/2012)  . Seizure disorder     questionable history of - will need to clarify with PCP  . Headache(784.0)     "q other  day" (11/09/2012)  . ESRD (end stage renal disease) on dialysis since 2012    "[place]; TTS" (11/09/2012)  . Hypercalcemia   . Hyperpotassemia   . Seizures    Past Surgical History  Procedure Laterality Date  . Shoulder open rotator cuff repair Right   . Total knee arthroplasty Left   . Bascilic vein transposition  03/07/2012    Procedure: BASCILIC VEIN TRANSPOSITION;  Surgeon: Conrad Triplett, MD;  Location: [facility];  Service: Vascular;  Laterality: Left;  First Stage  . Insertion of dialysis catheter      right chest  . Bascilic vein transposition Left 05/31/2012    Procedure: BASCILIC VEIN TRANSPOSITION;  Surgeon: Conrad Goodland, MD;  Location: [facility];  Service: Vascular;  Laterality: Left;  Left 2nd Stage Basilic Vein Transposition with gortex graft revision using 5mmx10cm graft  . Shoulder open rotator cuff repair Right    Family History  Problem Relation Age of Onset  . Diabetes Mother   . Hypertension Mother   . Stroke Mother   . Kidney failure Mother   . Cancer Father    History  Substance Use Topics  . Smoking status: Current Every Day Smoker -- 0.25 packs/day for 25 years    Types: Cigarettes  . Smokeless tobacco: Never  Used  . Alcohol Use: No     Comment: 11/09/2012 "been stopped drinking 1-2 yr ago"    Review of Systems  Constitutional: Positive for fatigue.  Neurological: Positive for weakness.  All other systems reviewed and are negative.      Allergies  Reglan  Home Medications   Current Outpatient Rx  Name  Route  Sig  Dispense  Refill  . calcium acetate (PHOSLO) 667 MG capsule   Oral   Take 667-2,001 mg by mouth See admin instructions. Take 2,001mg  three times a day with meals and take 667mg  a day with snacks         . hydrALAZINE (APRESOLINE) 100 MG tablet   Oral   Take 100 mg by mouth 2 (two) times daily.         Marland Kitchen levETIRAcetam (KEPPRA) 1000 MG tablet   Oral   Take 1,000 mg by mouth 2 (two) times daily.          BP 107/73  Pulse 76   Temp(Src) 98 F (36.7 C) (Oral)  SpO2 100% Physical Exam  Nursing note and vitals reviewed. Constitutional: [name] oriented to person, place, and time. He appears well-developed and well-nourished. No distress.  HENT:  Head: Normocephalic and atraumatic.  Mouth/Throat: Oropharynx is clear and moist. Mucous membranes are pale and not dry.  Eyes: Conjunctivae are normal.  Neck: Normal range of motion. Neck supple.  Cardiovascular: Normal rate, regular rhythm and normal heart sounds.   Pulmonary/Chest: Effort normal and breath sounds normal.  Abdominal: Soft. Bowel sounds are normal. There is no tenderness.  Musculoskeletal: Normal range of motion. He exhibits no edema.  Neurological: [name] alert and oriented to person, place, and time.  Skin: Skin is warm and dry. [name] not diaphoretic.  Psychiatric: He has a normal mood and affect. His behavior is normal.    ED Course  Procedures (including critical care time) Labs Review Labs Reviewed  CBC WITH DIFFERENTIAL - Abnormal; Notable for the following:    RBC 2.42 (*)    Hemoglobin 6.8 (*)    HCT 21.8 (*)    RDW 17.2 (*)    All other components within normal limits  BASIC METABOLIC PANEL - Abnormal; Notable for the following:    Potassium 3.6 (*)    Glucose, Bld 157 (*)    Creatinine, Ser 4.23 (*)    Calcium 8.1 (*)    GFR calc non Af Amer 14 (*)    GFR calc Af Amer 17 (*)    All other components within normal limits  TYPE AND SCREEN   Imaging Review No results found.   EKG Interpretation None      MDM   Final diagnoses:  Symptomatic anemia    Patient presenting with symptomatic anemia, sent over from dialysis after completing dialysis. He appears in no apparent distress, afebrile, stable vital signs. Hemoglobin of 6.2 on April 2, did not followup. Hemoglobin 6.8 today. He has had multiple transfusions recently as this is a chronic condition, last being one week ago. After speaking with Dr. Aileen Fass, [facility] for admission  for transfusion, patient was scheduled to have a transfusion this morning at short stay, however patient did not go and came to ED. Dr. Aileen Fass spoke with pt and short stay, who states after type-and-screen pt can be discharged for transfusion. Appt made for 9:00 am tomorrow morning. Pt stable for d/c. Return precautions given. Patient states understanding of treatment care plan and is agreeable. Case discussed  with attending Dr. Zenia Resides who also evaluated patient and agrees with plan of care.    Illene Labrador, PA-C 07/24/13 1422

## 2013-07-24 NOTE — ED Notes (Signed)
Pt had dialysis today and was told to come to the ED because his hemoglobin was low.

## 2013-07-24 NOTE — ED Provider Notes (Signed)
Medical screening examination/treatment/procedure(s) were conducted as a shared visit with non-physician practitioner(s) and myself.  I personally evaluated the patient during the encounter.   EKG Interpretation None       Leota Jacobsen, MD 07/24/13 1734

## 2013-07-25 ENCOUNTER — Encounter (HOSPITAL_COMMUNITY)
Admission: RE | Admit: 2013-07-25 | Discharge: 2013-07-25 | Disposition: A | Discharge status: Home / Self Care | Payer: Medicare Other | Source: Ambulatory Visit | Attending: Nephrology | Admitting: Nephrology

## 2013-07-25 ENCOUNTER — Encounter (HOSPITAL_COMMUNITY): Payer: Medicare Other

## 2013-07-25 DIAGNOSIS — Z5181 Encounter for therapeutic drug level monitoring: Secondary | ICD-10-CM | POA: Insufficient documentation

## 2013-07-25 DIAGNOSIS — N186 End stage renal disease: Secondary | ICD-10-CM | POA: Insufficient documentation

## 2013-07-25 DIAGNOSIS — D649 Anemia, unspecified: Secondary | ICD-10-CM | POA: Insufficient documentation

## 2013-07-25 LAB — TYPE AND SCREEN
ABO/RH(D): A POS
Antibody Screen: NEGATIVE
Unit division: 0
Unit division: 0

## 2013-07-25 LAB — PREPARE RBC (CROSSMATCH)

## 2013-07-25 MED ORDER — ACETAMINOPHEN 325 MG PO TABS
650.0000 mg | ORAL_TABLET | Freq: Once | ORAL | Status: AC
  Administered 2013-07-25: 650 mg via ORAL

## 2013-07-25 MED ORDER — DIPHENHYDRAMINE HCL 25 MG PO CAPS
25.0000 mg | ORAL_CAPSULE | Freq: Once | ORAL | Status: AC
  Administered 2013-07-25: 25 mg via ORAL

## 2013-07-25 MED ORDER — DIPHENHYDRAMINE HCL 25 MG PO CAPS
ORAL_CAPSULE | ORAL | Status: AC
  Filled 2013-07-25: qty 1

## 2013-07-25 MED ORDER — ACETAMINOPHEN 325 MG PO TABS
ORAL_TABLET | ORAL | Status: AC
  Filled 2013-07-25: qty 2

## 2013-07-25 NOTE — Progress Notes (Signed)
CSW consult to pt regarding transportation from ED to home. Pt also has an appt on 07/25/13 at 8 am for blood transfusion at Short Stay. CSW placed call to transportation to [facility] and transportation was arranged. No futher CSW intervention needed.   [address] [place]

## 2013-07-26 LAB — TYPE AND SCREEN
ABO/RH(D): A POS
ANTIBODY SCREEN: NEGATIVE
UNIT DIVISION: 0
Unit division: 0

## 2013-07-27 ENCOUNTER — Telehealth: Payer: Self-pay | Admitting: Internal Medicine

## 2013-07-27 ENCOUNTER — Encounter (HOSPITAL_COMMUNITY): Payer: Medicare Other

## 2013-07-27 NOTE — Telephone Encounter (Signed)
pt living facility called to r/s missed appt...done...they are aware of appt

## 2013-08-01 ENCOUNTER — Other Ambulatory Visit (HOSPITAL_BASED_OUTPATIENT_CLINIC_OR_DEPARTMENT_OTHER): Payer: Medicare Other

## 2013-08-01 ENCOUNTER — Ambulatory Visit (HOSPITAL_BASED_OUTPATIENT_CLINIC_OR_DEPARTMENT_OTHER): Payer: Medicare Other | Admitting: Internal Medicine

## 2013-08-01 ENCOUNTER — Telehealth: Payer: Self-pay | Admitting: Internal Medicine

## 2013-08-01 ENCOUNTER — Encounter: Payer: Self-pay | Admitting: Internal Medicine

## 2013-08-01 VITALS — BP 169/62 | HR 79 | Temp 98.0°F | Resp 18 | Ht 69.0 in | Wt 128.4 lb

## 2013-08-01 DIAGNOSIS — F1410 Cocaine abuse, uncomplicated: Secondary | ICD-10-CM

## 2013-08-01 DIAGNOSIS — F17200 Nicotine dependence, unspecified, uncomplicated: Secondary | ICD-10-CM

## 2013-08-01 DIAGNOSIS — N186 End stage renal disease: Secondary | ICD-10-CM

## 2013-08-01 DIAGNOSIS — E440 Moderate protein-calorie malnutrition: Secondary | ICD-10-CM

## 2013-08-01 DIAGNOSIS — Z720 Tobacco use: Secondary | ICD-10-CM

## 2013-08-01 DIAGNOSIS — D638 Anemia in other chronic diseases classified elsewhere: Secondary | ICD-10-CM

## 2013-08-01 DIAGNOSIS — Z992 Dependence on renal dialysis: Secondary | ICD-10-CM

## 2013-08-01 DIAGNOSIS — D649 Anemia, unspecified: Secondary | ICD-10-CM

## 2013-08-01 DIAGNOSIS — E119 Type 2 diabetes mellitus without complications: Secondary | ICD-10-CM

## 2013-08-01 DIAGNOSIS — G40909 Epilepsy, unspecified, not intractable, without status epilepticus: Secondary | ICD-10-CM

## 2013-08-01 LAB — COMPREHENSIVE METABOLIC PANEL (CC13)
AST: 14 U/L (ref 5–34)
Albumin: 3 g/dL — ABNORMAL LOW (ref 3.5–5.0)
Alkaline Phosphatase: 54 U/L (ref 40–150)
Anion Gap: 9 mEq/L (ref 3–11)
BILIRUBIN TOTAL: 0.37 mg/dL (ref 0.20–1.20)
BUN: 22.7 mg/dL (ref 7.0–26.0)
CALCIUM: 9.2 mg/dL (ref 8.4–10.4)
CHLORIDE: 105 meq/L (ref 98–109)
CO2: 32 mEq/L — ABNORMAL HIGH (ref 22–29)
CREATININE: 5.9 mg/dL — AB (ref 0.7–1.3)
Glucose: 84 mg/dl (ref 70–140)
Potassium: 4.1 mEq/L (ref 3.5–5.1)
Sodium: 147 mEq/L — ABNORMAL HIGH (ref 136–145)
Total Protein: 7.5 g/dL (ref 6.4–8.3)

## 2013-08-01 LAB — CBC WITH DIFFERENTIAL/PLATELET
BASO%: 1.4 % (ref 0.0–2.0)
BASOS ABS: 0.1 10*3/uL (ref 0.0–0.1)
EOS%: 3.2 % (ref 0.0–7.0)
Eosinophils Absolute: 0.2 10*3/uL (ref 0.0–0.5)
HEMATOCRIT: 30.6 % — AB (ref 38.4–49.9)
HEMOGLOBIN: 9.5 g/dL — AB (ref 13.0–17.1)
LYMPH%: 16.9 % (ref 14.0–49.0)
MCH: 27.4 pg (ref 27.2–33.4)
MCHC: 30.9 g/dL — AB (ref 32.0–36.0)
MCV: 88.7 fL (ref 79.3–98.0)
MONO#: 0.6 10*3/uL (ref 0.1–0.9)
MONO%: 10.7 % (ref 0.0–14.0)
NEUT#: 3.7 10*3/uL (ref 1.5–6.5)
NEUT%: 67.8 % (ref 39.0–75.0)
PLATELETS: 260 10*3/uL (ref 140–400)
RBC: 3.45 10*6/uL — ABNORMAL LOW (ref 4.20–5.82)
RDW: 16.8 % — ABNORMAL HIGH (ref 11.0–14.6)
WBC: 5.5 10*3/uL (ref 4.0–10.3)
lymph#: 0.9 10*3/uL (ref 0.9–3.3)

## 2013-08-01 LAB — LACTATE DEHYDROGENASE (CC13): LDH: 168 U/L (ref 125–245)

## 2013-08-01 LAB — HOLD TUBE, BLOOD BANK

## 2013-08-01 NOTE — Progress Notes (Signed)
[facility] OFFICE PROGRESS NOTE  MATTINGLY,MICHAEL T, MD [place] [address]  DIAGNOSIS: Anemia - Plan: CBC with Differential, Basic metabolic panel (Bmet) - [facility], Lactate dehydrogenase (LDH) - [facility], CBC with Differential  ESRD (end stage renal disease) on dialysis - Plan: CBC with Differential, Basic metabolic panel (Bmet) - [facility], Lactate dehydrogenase (LDH) - [facility], CBC with Differential  Cocaine abuse  Malnutrition of moderate degree  Seizure disorder  Tobacco abuse  Diabetes mellitus without complication  Chief Complaint  Patient presents with  . Anemia    CURRENT THERAPY: Observation.   INTERVAL HISTORY: [name] 58 y.o. male with a history of polysubstance abuse including crack cocaine and tobacco, ESRDz on hemodialysis (Tues,Thursday, Saturday) and hypertension, anemia of chronic disease, and diabetes mellitus is here for follow-up.  He was recently seen by me in hospital on 06/25/2013 secondary to profound anemia.  GI workup was negative at that time but FOBT was positive and he has been scheduled for capsule endoscopy with Dr. Henrene Pastor on 09/06/2013.  He had a second bone marrow biopsy as indicated below.  It revealed some atypical lymphoid aggregates but his cytogenetics were within normal limits.  Previously, he was lost-to-follow up in the clinic.   His last clinic follow-up was 02/12/2013.  For details regarding his prior history please refer to this note.     He reported on 4/7 with recurrent anemia from dialysis and was transfused two units of packed RBCs.  [name] on epogen at dialysis. He states that he is feeling better. He denies recent recreational crack cocaine use. He denies any constitutional symptoms such as fevers or chills. However he does report continued weight loss. He also reports feeling depressed about his multiple co-morbidities being a factor in his relapse with substance abuse.  His last dialysis was on this past Saturday.  [name] on Tues/Thur/Sat schedule. He reports wanting help for his social conditions including his housing situation.  His nearest relative is his brother who lives in [place], [place].  Patient celebrated his birthday yesterday.   MEDICAL HISTORY: Past Medical History  Diagnosis Date  . Hypertension   . Anemia, chronic disease   . CHF (congestive heart failure)     diastolic.  EF 60 - 65% per [facility] eco 11/2011  . Cocaine abuse     mentioned in notes from [facility]  . Hepatitis C antibody test positive     was HIV negative, 02/28/12  . Hepatitis B core antibody positive     03/01/10  . Positive QuantiFERON-TB Gold test     11/2011  . Helicobacter pylori gastritis     not defined if this was treated  . Polyp of colon, adenomatous     May 2012.  Dr Trenton Founds in [place]  . Shortness of breath   . Hematochezia   . Head injury, closed, with concussion   . History of blood transfusion     "last one was 2 days ago" (11/09/2012)  . Type II diabetes mellitus     on oral pills only  . Arthritis     "right shoulder" (11/09/2012)  . Seizure disorder     questionable history of - will need to clarify with PCP  . Headache(784.0)     "q other day" (11/09/2012)  . ESRD (end stage renal disease) on dialysis since 2012    "[facility]; TTS" (11/09/2012)  . Hypercalcemia   . Hyperpotassemia   . Seizures  INTERIM HISTORY: has Hypertension; Diabetes mellitus without complication; ESRD (end stage renal disease) on dialysis; Seizure disorder; Normocytic anemia; Hyperphosphatemia; Hypocalcemia; Volume overload; Cocaine abuse; Tobacco abuse; GI bleed; Hemorrhage of rectum and anus; Anemia; Heme + stool; and Malnutrition of moderate degree on his problem list.    ALLERGIES:  is allergic to reglan.  MEDICATIONS: has a current medication list which includes the following prescription(s): calcium acetate, hydralazine, and levetiracetam.  SURGICAL HISTORY:  Past Surgical History  Procedure  Laterality Date  . Shoulder open rotator cuff repair Right   . Total knee arthroplasty Left   . Bascilic vein transposition  03/07/2012    Procedure: BASCILIC VEIN TRANSPOSITION;  Surgeon: Conrad [place], MD;  Location: [facility];  Service: Vascular;  Laterality: Left;  First Stage  . Insertion of dialysis catheter      right chest  . Bascilic vein transposition Left 05/31/2012    Procedure: BASCILIC VEIN TRANSPOSITION;  Surgeon: Conrad [place], MD;  Location: [facility];  Service: Vascular;  Laterality: Left;  Left 2nd Stage Basilic Vein Transposition with gortex graft revision using 26mx10cm graft  . Shoulder open rotator cuff repair Right    Family History  Problem Relation Age of Onset  . Diabetes Mother   . Hypertension Mother   . Stroke Mother   . Kidney failure Mother   . Cancer Father    REVIEW OF SYSTEMS:   Constitutional: Denies fevers, chills or abnormal weight loss Eyes: Denies blurriness of vision Ears, nose, mouth, throat, and face: Denies mucositis or sore throat Respiratory: Denies cough, dyspnea or wheezes Cardiovascular: Denies palpitation, chest discomfort or lower extremity swelling Gastrointestinal:  Denies nausea, heartburn or change in bowel habits Skin: Denies abnormal skin rashes Lymphatics: Denies new lymphadenopathy or easy bruising Neurological:Denies numbness, tingling or new weaknesses Behavioral/Psych: Mood is a bit down due to his co-morbidities as noted in HPI, no new changes  All other systems were reviewed with the patient and are negative.  PHYSICAL EXAMINATION: ECOG PERFORMANCE STATUS: 0 - Asymptomatic  Blood pressure 169/62, pulse 79, temperature 98 F (36.7 C), temperature source Oral, resp. rate 18, height 5' 9"  (1.753 m), weight 128 lb 6.4 oz (58.242 kg), SpO2 99.00%.  GENERAL:alert, no distress and comfortable; thin male who appears older than his stated age.  SKIN: skin color, texture, turgor are normal, no rashes or significant lesions EYES:  normal, Conjunctiva are pink and non-injected, sclera clear OROPHARYNX:no exudate, no erythema and lips, buccal mucosa, and tongue normal; poor dentition w multiple dental caries.  NECK: supple, thyroid normal size, non-tender, without nodularity LYMPH:  no palpable lymphadenopathy in the cervical, axillary or supraclavicular LUNGS: clear to auscultation and percussion with normal breathing effort HEART: regular rate & rhythm and no murmurs and no lower extremity edema ABDOMEN:abdomen soft, non-tender and normal bowel sounds; no splenomegaly Musculoskeletal:no cyanosis of digits and no clubbing; Left upper extremity AV-fistula with good thrill. Chronic venous changes of lower extremities bilaterally.  NEURO: alert & oriented x 3 with fluent speech, no focal motor/sensory deficits  Labs:  Lab Results  Component Value Date   WBC 5.5 08/01/2013   HGB 9.5* 08/01/2013   HCT 30.6* 08/01/2013   MCV 88.7 08/01/2013   PLT 260 08/01/2013   NEUTROABS 3.7 08/01/2013      Chemistry      Component Value Date/Time   NA 147* 08/01/2013 1201   NA 142 07/24/2013 1218   K 4.1 08/01/2013 1201   K 3.6* 07/24/2013 1218  CL 98 07/24/2013 1218   CO2 32* 08/01/2013 1201   CO2 32 07/24/2013 1218   BUN 22.7 08/01/2013 1201   BUN 16 07/24/2013 1218   CREATININE 5.9* 08/01/2013 1201   CREATININE 4.23* 07/24/2013 1218      Component Value Date/Time   CALCIUM 9.2 08/01/2013 1201   CALCIUM 8.1* 07/24/2013 1218   ALKPHOS 54 08/01/2013 1201   ALKPHOS 56 07/08/2013 0840   AST 14 08/01/2013 1201   AST 17 07/08/2013 0840   ALT <6 08/01/2013 1201   ALT 10 07/08/2013 0840   BILITOT 0.37 08/01/2013 1201   BILITOT 0.6 07/08/2013 0840     Results for ANGELOS, WASCO (MRN [id number]) as of 08/01/2013 13:07  Ref. Range 05/04/2012 17:45 11/07/2012 12:51 01/09/2013 01:15 06/21/2013 14:27 07/07/2013 12:25  Fecal Occult Blood, POC Latest Range: NEGATIVE  POSITIVE (A) POSITIVE (A) POSITIVE (A) POSITIVE (A) POSITIVE (A)    RADIOGRAPHIC  STUDIES: None  PATHOLOGY: NORMOCELLULAR BONE MARROW FOR AGE WITH ATYPICAL LYMPHOID AGGREGATES. - TRILINEAGE HEMATOPOIESIS. - SEE COMMENT PERIPHERAL BLOOD: - NORMOCYTIC-NORMOCHROMIC ANEMIA. Diagnosis Note The bone marrow shows several variably sized interstitial and paratrabecular lymphoid aggregates mostly composed of small lymphoid cells. Flow cytometric analysis failed to show any significant changes likely due to sampling. Immunohistochemical stains performed on core biopsy show that the lymphoid aggregates are a mixture of T and B cells but with a significant B cell component. B cells in particular lack CD5, CD10, or cyclin D1 expression. The overall histologic and phenotypic features are worrisome for involvement by a B cell lymphoproliferative process. Full hematologic evaluation is recommended  FLOW CYTOMETRY: - PREDOMINANCE OF T CELLS WITH NO ABNORMAL PHENOTYPE. - MINOR B CELL POPULATION PRESENT. - SEE NOTE. Diagnosis Comment: There is a minor B cell population present (14% of lymphocytes) expressing pan B cell antigens but with extremely dim/negative staining for surface immunoglobulin light chains that hinder assessment and/or quantitation of clonality. However, an abnormal B cell phenotype such as co-expression of CD5 and CD20 or CD10 expression is not identified. Clinical correlation is recommended. (BNS:gt, 01/09/13) Joella Prince SMIR MD  REPEAT BONE MARROW BIOPSY ON 06/25/2013. Diagnosis Bone Marrow, Aspirate,Biopsy, and Clot, right iliac - NORMOCELLULAR BONE MARROW FOR AGE WITH TRILINEAGE HEMATOPOIESIS. - SEVERAL LYMPHOID AGGREGATES PRESENT. - SEE COMMENT. PERIPHERAL BLOOD: - NORMOCYTIC-NORMOCHROMIC ANEMIA. Diagnosis Note The overall morphologic and phenotypic features are similar to previous bone marrow ([id number]) and worrisome for minimal involvement by a B-cell lymphoproliferative process which cannot be excluded. As in the previous material, the changes are limited and  not considered definitive or diagnostic of malignancy. Clinical correlation is recommended. Susanne Greenhouse MD Pathologist, Electronic Signature (Case signed 06/28/2013) SS AND MICROSCOPIC INFORMATION ASSESSMENT: [name] 58 y.o. male with a history of Anemia - Plan: CBC with Differential, Basic metabolic panel (Bmet) - [facility], Lactate dehydrogenase (LDH) - [facility], CBC with Differential  ESRD (end stage renal disease) on dialysis - Plan: CBC with Differential, Basic metabolic panel (Bmet) - [facility], Lactate dehydrogenase (LDH) - [facility], CBC with Differential  Cocaine abuse  Malnutrition of moderate degree  Seizure disorder  Tobacco abuse  Diabetes mellitus without complication   PLAN:  1. Minor B cell population (14% of lymphocytes)  ----We reviewed his pathology with Dr. Gari Crown of pathology. 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 He suggested a repeat biopsy while he is in patient. It is scheduled for Monday. He does not have lymphadenopathy on exam. He does not have constitutional symptoms such as night sweats,  fevers or weight lost.  Bone marrow overall morphologic and phenotypic features are similar to previous bone marrow ([id number]) and worrisome for minimal involvement by a B-cell lymphoproliferative process which cannot be excluded  --I reassured the patient that clinically he is without signs of lymphoma such as night sweats and or lymph node enlargement.   His white blood cell count is 5.6 with normal plalets.  At this point, given his more active co-morbidities and history of non-compliance, it might be reasonable to actively observe closely for now.   2. Anemia of Chronic disease and likely component of anemia of blood lost. --Likely secondary to his ESRDz.   His hemoglobin of  9.8 today.  He has a history of non-compliance with dialysis.  [facility] at [facility] on T/Thurs/Sat. He was on aranesp with dialysis, 200/week.   --Patient does  however have a positive FOBT which may need repeat GI evaluation.  [name] on protonix 40 mg bid.  He has follow up with Dr. Henrene Pastor on 05/21 for capsule endoscopy. Check CBC every 2 weeks for possible transfusion.   3. ESRDz on dialysis.    --As noted above, he will continue his dialysis.  He has a history of non-compliance.    4.  Polysubstance abuse including crack cocaine, tobacco abuse . --I discussed the importance of abstinence and offered assistance through social support programs, tobacco cessation abuse counseling and the patient declined smoking cessation.  5. Follow-up. --Patient instructed to follow up with [place] for repeat CBC, Chemistries in 1 month. CBC every 2 weeks.   All questions were answered. The patient knows to call the clinic with any problems, questions or concerns. We can certainly see the patient much sooner if necessary. He was provided an after visit summary.   I spent 15 minutes counseling the patient face to face. The total time spent in the appointment was 25 minutes.    Concha Norway, MD 08/01/2013 1:23 PM

## 2013-08-01 NOTE — Telephone Encounter (Signed)
Gave pt appt for labs and MD for April and May 2015

## 2013-08-15 ENCOUNTER — Other Ambulatory Visit (HOSPITAL_BASED_OUTPATIENT_CLINIC_OR_DEPARTMENT_OTHER): Payer: Medicare Other

## 2013-08-15 DIAGNOSIS — Z992 Dependence on renal dialysis: Secondary | ICD-10-CM

## 2013-08-15 DIAGNOSIS — D649 Anemia, unspecified: Secondary | ICD-10-CM

## 2013-08-15 DIAGNOSIS — N186 End stage renal disease: Secondary | ICD-10-CM

## 2013-08-15 DIAGNOSIS — D638 Anemia in other chronic diseases classified elsewhere: Secondary | ICD-10-CM

## 2013-08-15 LAB — CBC WITH DIFFERENTIAL/PLATELET
BASO%: 1.1 % (ref 0.0–2.0)
Basophils Absolute: 0.1 10*3/uL (ref 0.0–0.1)
EOS ABS: 0.2 10*3/uL (ref 0.0–0.5)
EOS%: 3.4 % (ref 0.0–7.0)
HCT: 29.1 % — ABNORMAL LOW (ref 38.4–49.9)
HGB: 8.8 g/dL — ABNORMAL LOW (ref 13.0–17.1)
LYMPH#: 0.9 10*3/uL (ref 0.9–3.3)
LYMPH%: 20.9 % (ref 14.0–49.0)
MCH: 25.7 pg — ABNORMAL LOW (ref 27.2–33.4)
MCHC: 30.2 g/dL — AB (ref 32.0–36.0)
MCV: 85.1 fL (ref 79.3–98.0)
MONO#: 0.5 10*3/uL (ref 0.1–0.9)
MONO%: 11.7 % (ref 0.0–14.0)
NEUT%: 62.9 % (ref 39.0–75.0)
NEUTROS ABS: 2.8 10*3/uL (ref 1.5–6.5)
PLATELETS: 218 10*3/uL (ref 140–400)
RBC: 3.42 10*6/uL — AB (ref 4.20–5.82)
RDW: 16.2 % — ABNORMAL HIGH (ref 11.0–14.6)
WBC: 4.5 10*3/uL (ref 4.0–10.3)

## 2013-08-21 ENCOUNTER — Encounter (HOSPITAL_COMMUNITY): Payer: Self-pay | Admitting: Emergency Medicine

## 2013-08-21 ENCOUNTER — Inpatient Hospital Stay (HOSPITAL_COMMUNITY)
Admission: EM | Admit: 2013-08-21 | Discharge: 2013-08-27 | DRG: 811 | Disposition: A | Discharge status: Home / Self Care | Payer: Medicare Other | Attending: Internal Medicine | Admitting: Internal Medicine

## 2013-08-21 DIAGNOSIS — D62 Acute posthemorrhagic anemia: Secondary | ICD-10-CM | POA: Diagnosis present

## 2013-08-21 DIAGNOSIS — M19019 Primary osteoarthritis, unspecified shoulder: Secondary | ICD-10-CM | POA: Diagnosis present

## 2013-08-21 DIAGNOSIS — Z9119 Patient's noncompliance with other medical treatment and regimen: Secondary | ICD-10-CM

## 2013-08-21 DIAGNOSIS — E119 Type 2 diabetes mellitus without complications: Secondary | ICD-10-CM | POA: Diagnosis present

## 2013-08-21 DIAGNOSIS — B1920 Unspecified viral hepatitis C without hepatic coma: Secondary | ICD-10-CM | POA: Diagnosis present

## 2013-08-21 DIAGNOSIS — Z720 Tobacco use: Secondary | ICD-10-CM

## 2013-08-21 DIAGNOSIS — N186 End stage renal disease: Secondary | ICD-10-CM | POA: Diagnosis present

## 2013-08-21 DIAGNOSIS — D509 Iron deficiency anemia, unspecified: Secondary | ICD-10-CM | POA: Diagnosis present

## 2013-08-21 DIAGNOSIS — D500 Iron deficiency anemia secondary to blood loss (chronic): Principal | ICD-10-CM | POA: Diagnosis present

## 2013-08-21 DIAGNOSIS — Z91199 Patient's noncompliance with other medical treatment and regimen due to unspecified reason: Secondary | ICD-10-CM

## 2013-08-21 DIAGNOSIS — D47Z9 Other specified neoplasms of uncertain behavior of lymphoid, hematopoietic and related tissue: Secondary | ICD-10-CM | POA: Diagnosis present

## 2013-08-21 DIAGNOSIS — D649 Anemia, unspecified: Secondary | ICD-10-CM

## 2013-08-21 DIAGNOSIS — K625 Hemorrhage of anus and rectum: Secondary | ICD-10-CM

## 2013-08-21 DIAGNOSIS — M899 Disorder of bone, unspecified: Secondary | ICD-10-CM | POA: Diagnosis present

## 2013-08-21 DIAGNOSIS — Z96659 Presence of unspecified artificial knee joint: Secondary | ICD-10-CM

## 2013-08-21 DIAGNOSIS — I10 Essential (primary) hypertension: Secondary | ICD-10-CM

## 2013-08-21 DIAGNOSIS — Z681 Body mass index (BMI) 19 or less, adult: Secondary | ICD-10-CM

## 2013-08-21 DIAGNOSIS — D631 Anemia in chronic kidney disease: Secondary | ICD-10-CM | POA: Diagnosis present

## 2013-08-21 DIAGNOSIS — I509 Heart failure, unspecified: Secondary | ICD-10-CM | POA: Diagnosis present

## 2013-08-21 DIAGNOSIS — Z992 Dependence on renal dialysis: Secondary | ICD-10-CM

## 2013-08-21 DIAGNOSIS — R195 Other fecal abnormalities: Secondary | ICD-10-CM

## 2013-08-21 DIAGNOSIS — N2581 Secondary hyperparathyroidism of renal origin: Secondary | ICD-10-CM | POA: Diagnosis present

## 2013-08-21 DIAGNOSIS — F17200 Nicotine dependence, unspecified, uncomplicated: Secondary | ICD-10-CM | POA: Diagnosis present

## 2013-08-21 DIAGNOSIS — E440 Moderate protein-calorie malnutrition: Secondary | ICD-10-CM

## 2013-08-21 DIAGNOSIS — M949 Disorder of cartilage, unspecified: Secondary | ICD-10-CM

## 2013-08-21 DIAGNOSIS — N039 Chronic nephritic syndrome with unspecified morphologic changes: Secondary | ICD-10-CM

## 2013-08-21 DIAGNOSIS — I120 Hypertensive chronic kidney disease with stage 5 chronic kidney disease or end stage renal disease: Secondary | ICD-10-CM | POA: Diagnosis present

## 2013-08-21 DIAGNOSIS — F1410 Cocaine abuse, uncomplicated: Secondary | ICD-10-CM | POA: Diagnosis present

## 2013-08-21 DIAGNOSIS — K922 Gastrointestinal hemorrhage, unspecified: Secondary | ICD-10-CM

## 2013-08-21 DIAGNOSIS — G40909 Epilepsy, unspecified, not intractable, without status epilepticus: Secondary | ICD-10-CM

## 2013-08-21 DIAGNOSIS — Z79899 Other long term (current) drug therapy: Secondary | ICD-10-CM

## 2013-08-21 DIAGNOSIS — I5030 Unspecified diastolic (congestive) heart failure: Secondary | ICD-10-CM | POA: Diagnosis present

## 2013-08-21 HISTORY — DX: Dependence on renal dialysis: Z99.2

## 2013-08-21 HISTORY — DX: End stage renal disease: N18.6

## 2013-08-21 LAB — CBC
HCT: 19.1 % — ABNORMAL LOW (ref 39.0–52.0)
HCT: 19.8 % — ABNORMAL LOW (ref 39.0–52.0)
HEMOGLOBIN: 6.1 g/dL — AB (ref 13.0–17.0)
HEMOGLOBIN: 6.2 g/dL — AB (ref 13.0–17.0)
MCH: 26.3 pg (ref 26.0–34.0)
MCH: 25.8 pg — ABNORMAL LOW (ref 26.0–34.0)
MCHC: 31.9 g/dL (ref 30.0–36.0)
MCHC: 31.3 g/dL (ref 30.0–36.0)
MCV: 82.3 fL (ref 78.0–100.0)
MCV: 82.5 fL (ref 78.0–100.0)
PLATELETS: 172 10*3/uL (ref 150–400)
Platelets: 174 10*3/uL (ref 150–400)
RBC: 2.32 MIL/uL — ABNORMAL LOW (ref 4.22–5.81)
RBC: 2.4 MIL/uL — ABNORMAL LOW (ref 4.22–5.81)
RDW: 17.6 % — ABNORMAL HIGH (ref 11.5–15.5)
RDW: 17.4 % — ABNORMAL HIGH (ref 11.5–15.5)
WBC: 5.5 10*3/uL (ref 4.0–10.5)
WBC: 6.9 10*3/uL (ref 4.0–10.5)

## 2013-08-21 LAB — PREPARE RBC (CROSSMATCH)

## 2013-08-21 LAB — COMPREHENSIVE METABOLIC PANEL
ALK PHOS: 51 U/L (ref 39–117)
ALT: 10 U/L (ref 0–53)
AST: 20 U/L (ref 0–37)
Albumin: 2.9 g/dL — ABNORMAL LOW (ref 3.5–5.2)
BUN: 50 mg/dL — ABNORMAL HIGH (ref 6–23)
CHLORIDE: 101 meq/L (ref 96–112)
CO2: 28 mEq/L (ref 19–32)
Calcium: 8 mg/dL — ABNORMAL LOW (ref 8.4–10.5)
Creatinine, Ser: 5.37 mg/dL — ABNORMAL HIGH (ref 0.50–1.35)
GFR, EST AFRICAN AMERICAN: 12 mL/min — AB (ref >90)
GFR, EST NON AFRICAN AMERICAN: 11 mL/min — AB (ref >90)
GLUCOSE: 109 mg/dL — AB (ref 70–99)
POTASSIUM: 3.4 meq/L — AB (ref 3.7–5.3)
SODIUM: 145 meq/L (ref 137–147)
Total Bilirubin: 0.3 mg/dL (ref 0.3–1.2)
Total Protein: 7.2 g/dL (ref 6.0–8.3)

## 2013-08-21 LAB — MRSA PCR SCREENING: MRSA BY PCR: NEGATIVE

## 2013-08-21 LAB — CREATININE, SERUM
Creatinine, Ser: 6.19 mg/dL — ABNORMAL HIGH (ref 0.50–1.35)
GFR calc Af Amer: 11 mL/min — ABNORMAL LOW (ref >90)
GFR, EST NON AFRICAN AMERICAN: 9 mL/min — AB (ref >90)

## 2013-08-21 MED ORDER — SODIUM CHLORIDE 0.9 % IV BOLUS (SEPSIS)
1000.0000 mL | Freq: Once | INTRAVENOUS | Status: AC
  Administered 2013-08-21: 1000 mL via INTRAVENOUS

## 2013-08-21 MED ORDER — SODIUM CHLORIDE 0.9 % IJ SOLN
3.0000 mL | Freq: Two times a day (BID) | INTRAMUSCULAR | Status: DC
  Administered 2013-08-22 – 2013-08-27 (×10): 3 mL via INTRAVENOUS

## 2013-08-21 MED ORDER — CALCIUM ACETATE 667 MG PO CAPS
667.0000 mg | ORAL_CAPSULE | ORAL | Status: DC | PRN
  Filled 2013-08-21: qty 1

## 2013-08-21 MED ORDER — CALCIUM ACETATE 667 MG PO CAPS
2001.0000 mg | ORAL_CAPSULE | Freq: Three times a day (TID) | ORAL | Status: DC
  Administered 2013-08-22 – 2013-08-27 (×14): 2001 mg via ORAL
  Filled 2013-08-21 (×19): qty 3

## 2013-08-21 MED ORDER — ACETAMINOPHEN 325 MG PO TABS
650.0000 mg | ORAL_TABLET | Freq: Once | ORAL | Status: AC
  Administered 2013-08-21: 650 mg via ORAL

## 2013-08-21 MED ORDER — HYDRALAZINE HCL 100 MG PO TABS: 100.0000 mg | ORAL_TABLET | ORAL | Status: DC

## 2013-08-21 MED ORDER — LEVETIRACETAM 500 MG PO TABS
1000.0000 mg | ORAL_TABLET | Freq: Every day | ORAL | Status: DC
  Administered 2013-08-21 – 2013-08-27 (×7): 1000 mg via ORAL
  Filled 2013-08-21 (×7): qty 2

## 2013-08-21 MED ORDER — FUROSEMIDE 10 MG/ML IJ SOLN
20.0000 mg | Freq: Once | INTRAMUSCULAR | Status: AC
  Administered 2013-08-21: 20 mg via INTRAVENOUS
  Filled 2013-08-21: qty 2

## 2013-08-21 MED ORDER — ZOLPIDEM TARTRATE 5 MG PO TABS
5.0000 mg | ORAL_TABLET | Freq: Every day | ORAL | Status: DC
  Administered 2013-08-21 – 2013-08-26 (×6): 5 mg via ORAL
  Filled 2013-08-21 (×6): qty 1

## 2013-08-21 MED ORDER — ACETAMINOPHEN 325 MG PO TABS
650.0000 mg | ORAL_TABLET | Freq: Four times a day (QID) | ORAL | Status: DC | PRN
Start: 2013-08-21 — End: 2013-08-27
  Administered 2013-08-21 – 2013-08-23 (×4): 650 mg via ORAL
  Filled 2013-08-21 (×4): qty 2

## 2013-08-21 MED ORDER — HYDRALAZINE HCL 50 MG PO TABS
100.0000 mg | ORAL_TABLET | ORAL | Status: DC
  Administered 2013-08-22 – 2013-08-27 (×16): 100 mg via ORAL
  Filled 2013-08-21 (×16): qty 2

## 2013-08-21 MED ORDER — HEPARIN SODIUM (PORCINE) 5000 UNIT/ML IJ SOLN
5000.0000 [IU] | Freq: Three times a day (TID) | INTRAMUSCULAR | Status: DC
  Administered 2013-08-21 – 2013-08-22 (×2): 5000 [IU] via SUBCUTANEOUS
  Filled 2013-08-21 (×5): qty 1

## 2013-08-21 MED ORDER — HYDRALAZINE HCL 20 MG/ML IJ SOLN
5.0000 mg | INTRAMUSCULAR | Status: DC | PRN
  Administered 2013-08-22 – 2013-08-25 (×5): 5 mg via INTRAVENOUS
  Filled 2013-08-21 (×5): qty 1

## 2013-08-21 MED ORDER — HYDRALAZINE HCL 50 MG PO TABS
100.0000 mg | ORAL_TABLET | ORAL | Status: DC
  Administered 2013-08-21 – 2013-08-25 (×4): 100 mg via ORAL
  Filled 2013-08-21 (×5): qty 2

## 2013-08-21 MED ORDER — HYDROCODONE-ACETAMINOPHEN 5-325 MG PO TABS
1.0000 | ORAL_TABLET | Freq: Four times a day (QID) | ORAL | Status: DC | PRN
  Administered 2013-08-21 – 2013-08-27 (×12): 1 via ORAL
  Filled 2013-08-21 (×12): qty 1

## 2013-08-21 NOTE — Progress Notes (Signed)
100 mg of hydralazine given for blood pressures 187/76 and 191/79. Blood pressure equals 158/64 after hydralazine. Will continue to monitor.

## 2013-08-21 NOTE — ED Provider Notes (Signed)
CSN: [id number]     Arrival date & time 08/21/13  1155 History   First MD Initiated Contact with Patient 08/21/13 1248     Chief Complaint  Patient presents with  . Fatigue     (Consider location/radiation/quality/duration/timing/severity/associated sxs/prior Treatment) HPI Comments: Pt w/ hx of CHF, Cocaine abuse, ESRD on HD comes in to the ER with cc of fatigue. Pt sent to the ER after his dialysis with low Hb. Pt denies any blood loss. He has no n/v/f/c. No chest pain, dib, + weakness, and some dizziness intermittently.  Pt has hx of GI bleeds, but recent upper and lower GI scans per note were not indicative of any source, and last discharged note recommended outpatient GI f/u for capsule endoscopy - which he didn't go to.  The history is provided by the patient.    Past Medical History  Diagnosis Date  . Hypertension   . Anemia, chronic disease   . CHF (congestive heart failure)     diastolic.  EF 60 - 65% per [facility] 11/2011  . Cocaine abuse     mentioned in notes from [place]  . Hepatitis C antibody test positive     was HIV negative, 02/28/12  . Hepatitis B core antibody positive     03/01/10  . Positive QuantiFERON-TB Gold test     11/2011  . Helicobacter pylori gastritis     not defined if this was treated  . Polyp of colon, adenomatous     May 2012.  Dr Trenton Founds in [place]  . Shortness of breath   . Hematochezia   . Head injury, closed, with concussion   . History of blood transfusion     "last one was 2 days ago" (11/09/2012)  . Type II diabetes mellitus     on oral pills only  . Arthritis     "right shoulder" (11/09/2012)  . Seizure disorder     questionable history of - will need to clarify with PCP  . Headache(784.0)     "q other day" (11/09/2012)  . ESRD (end stage renal disease) on dialysis since 2012    "[place]; TTS" (11/09/2012)  . Hypercalcemia   . Hyperpotassemia   . Seizures    Past Surgical History  Procedure Laterality Date  .  Shoulder open rotator cuff repair Right   . Total knee arthroplasty Left   . Bascilic vein transposition  03/07/2012    Procedure: BASCILIC VEIN TRANSPOSITION;  Surgeon: Conrad Brandywine, MD;  Location: [place];  Service: Vascular;  Laterality: Left;  First Stage  . Insertion of dialysis catheter      right chest  . Bascilic vein transposition Left 05/31/2012    Procedure: BASCILIC VEIN TRANSPOSITION;  Surgeon: Conrad Duque, MD;  Location: [place];  Service: Vascular;  Laterality: Left;  Left 2nd Stage Basilic Vein Transposition with gortex graft revision using 62mmx10cm graft  . Shoulder open rotator cuff repair Right    Family History  Problem Relation Age of Onset  . Diabetes Mother   . Hypertension Mother   . Stroke Mother   . Kidney failure Mother   . Cancer Father    History  Substance Use Topics  . Smoking status: Current Every Day Smoker -- 0.25 packs/day for 25 years    Types: Cigarettes  . Smokeless tobacco: Never Used  . Alcohol Use: No     Comment: 11/09/2012 "been stopped drinking 1-2 yr ago"    Review of Systems  Constitutional: Positive for fatigue. Negative for activity change and appetite change.  Respiratory: Negative for cough and shortness of breath.   Cardiovascular: Negative for chest pain.  Gastrointestinal: Negative for abdominal pain.  Genitourinary: Negative for dysuria.  Neurological: Positive for dizziness, weakness and light-headedness. Negative for syncope.  All other systems reviewed and are negative.     Allergies  Reglan  Home Medications   Prior to Admission medications   Medication Sig Start Date End Date Taking? Authorizing Provider  calcium acetate (PHOSLO) 667 MG capsule Take 667-2,001 mg by mouth See admin instructions. Take 2,001mg  three times a day with meals and take 667mg  a day with snacks   Yes Historical Provider, MD  hydrALAZINE (APRESOLINE) 100 MG tablet Take 100 mg by mouth See admin instructions. Take 100 mg twice a day on dialysis  days and take 100 mg four times a day on non dialysis days   Yes Historical Provider, MD  levETIRAcetam (KEPPRA) 1000 MG tablet Take 1,000 mg by mouth daily.    Yes Historical Provider, MD  zolpidem (AMBIEN) 5 MG tablet Take 5 mg by mouth at bedtime.   Yes Historical Provider, MD   BP 126/61  Pulse 94  Temp(Src) 99.9 F (37.7 C) (Oral)  Resp 20  SpO2 94% Physical Exam  Nursing note and vitals reviewed. Constitutional: [name] oriented to person, place, and time. He appears well-developed.  HENT:  Head: Normocephalic and atraumatic.  Eyes: Conjunctivae and EOM are normal. Pupils are equal, round, and reactive to light.  Neck: Normal range of motion. Neck supple.  Cardiovascular: Normal rate and regular rhythm.   Pulmonary/Chest: Effort normal and breath sounds normal.  Abdominal: Soft. Bowel sounds are normal. He exhibits no distension. There is no tenderness. There is no rebound and no guarding.  Neurological: [name] alert and oriented to person, place, and time.  Skin: Skin is warm.    ED Course  Procedures (including critical care time) Labs Review Labs Reviewed  CBC - Abnormal; Notable for the following:    RBC 2.32 (*)    Hemoglobin 6.1 (*)    HCT 19.1 (*)    RDW 17.6 (*)    All other components within normal limits  COMPREHENSIVE METABOLIC PANEL - Abnormal; Notable for the following:    Potassium 3.4 (*)    Glucose, Bld 109 (*)    BUN 50 (*)    Creatinine, Ser 5.37 (*)    Calcium 8.0 (*)    Albumin 2.9 (*)    GFR calc non Af Amer 11 (*)    GFR calc Af Amer 12 (*)    All other components within normal limits  POC OCCULT BLOOD, ED  TYPE AND SCREEN  PREPARE RBC (CROSSMATCH)    Imaging Review No results found.   EKG Interpretation None      MDM   Final diagnoses:  Anemia  ESRD (end stage renal disease) on dialysis   Pt comes in with cc of anemia. Hx of GI bleed, and ESRD. Likely anemia is multifactorial. I reviewed last note from Gi service, and [facility]  discharge summary - and it appears that at this time they dont think any new endoscopy is needed, and capsule endoscopy was recommended. So we will start transfusion for this symptomatic anemia, and admit for further evaluation and serial Hb monitoring.  CRITICAL CARE Performed by: Mayley Lish   Total critical care time: 35 min. Symptomatic anemia - with Hb of 6, needing transfusion.  Critical care  time was exclusive of separately billable procedures and treating other patients.  Critical care was necessary to treat or prevent imminent or life-threatening deterioration.  Critical care was time spent personally by me on the following activities: development of treatment plan with patient and/or surrogate as well as nursing, discussions with consultants, evaluation of patient's response to treatment, examination of patient, obtaining history from patient or surrogate, ordering and performing treatments and interventions, ordering and review of laboratory studies, ordering and review of radiographic studies, pulse oximetry and re-evaluation of patient's condition.    Varney Biles, MD 08/21/13 1734

## 2013-08-21 NOTE — Progress Notes (Signed)
Received lab alert about patient's hemoglobin level. Hemoglobin equals 6.2, 2nd unit of PRBC about to be transfused. NP on call aware, will continue to monitor.

## 2013-08-21 NOTE — H&P (Signed)
[facility] History and Physical  [name] GEX:[id number] DOB: 10-31-1955 DOA: 08/21/2013  Referring physician: ED PCP: Windy Kalata, MD  Specialists: Nephrology + GI called by me  Chief Complaint: Fatigue  HPI: [name] is a 58 y.o. male known h/o ESRD t/th/sat [[facility]], htn, dm ty2, Prior Cocaine abuse, Anemia Renal disease with blood loss component-supposed to get Capsule by Dr. Henrene Pastor as OP after 07/07/13 admit [actually had Capsule/EGd and colonoscopy in [place] 2012], h/o Sz, prior TB exposure, Htn, came to [facility] ed straight from his dialysis center  [facility] for weakness and malaise He denies any overt nausea vomiting abdominal pain but had 1-2 episodes of diarrhea. He states that that is chronic diarrhea without any specific etiology that he knows of. He states that this happens occasionally when he eats certain types of foods. He has had no falls or dizziness he has had no dark or tarry stool he has no specific abdominal pain he has no fever no chills or shortness of breath although a low-grade fever is noted in the emergency room. He has no chest pain yesterday sputum he denies any unilateral weakness Review of records reveals that he was seen by Dr. Henrene Pastor 05/28/13 and her workup was done and it was encouraged that he followup with oncology which he did .  Oncology notes as well as bone marrow biopsies did not potential B cell proliferative disorder as of 08/01/13 note . This suggested potential follicular lymphoma but this was inconclusive a repeat bone marrow biopsy was scheduled but I don't think it was done as yet. [name] a poor historian and is sleepy at bedside in saying that he is weak and therefore is a little less cooperative with exam He states that he has not used cocaine he lives with friends in [place] and doesn't drive He has a daughter   Noted to have low-grade fever in the ED 100.2  white count of 6.1, MCV 82.3, WBC 5.5 platelets 172 BUN/creatinine  50/5 point 37 potassium 3.4, and 2.9 rest normal Coags not performed    Review of Systems: As above  Past Medical History  Diagnosis Date  . Hypertension   . Anemia, chronic disease   . CHF (congestive heart failure)     diastolic.  EF 60 - 65% per [facility] eco 11/2011  . Cocaine abuse     mentioned in notes from [place]  . Hepatitis C antibody test positive     was HIV negative, 02/28/12  . Hepatitis B core antibody positive     03/01/10  . Positive QuantiFERON-TB Gold test     11/2011  . Helicobacter pylori gastritis     not defined if this was treated  . Polyp of colon, adenomatous     May 2012.  Dr Trenton Founds in [place]  . Shortness of breath   . Hematochezia   . Head injury, closed, with concussion   . History of blood transfusion     "last one was 2 days ago" (11/09/2012)  . Type II diabetes mellitus     on oral pills only  . Arthritis     "right shoulder" (11/09/2012)  . Seizure disorder     questionable history of - will need to clarify with PCP  . Headache(784.0)     "q other day" (11/09/2012)  . ESRD (end stage renal disease) on dialysis since 2012    "[place]; TTS" (11/09/2012)  . Hypercalcemia   . Hyperpotassemia   .  Seizures    Past Surgical History  Procedure Laterality Date  . Shoulder open rotator cuff repair Right   . Total knee arthroplasty Left   . Bascilic vein transposition  03/07/2012    Procedure: BASCILIC VEIN TRANSPOSITION;  Surgeon: Conrad Woodinville, MD;  Location: [place];  Service: Vascular;  Laterality: Left;  First Stage  . Insertion of dialysis catheter      right chest  . Bascilic vein transposition Left 05/31/2012    Procedure: BASCILIC VEIN TRANSPOSITION;  Surgeon: Conrad Woodbury, MD;  Location: [place];  Service: Vascular;  Laterality: Left;  Left 2nd Stage Basilic Vein Transposition with gortex graft revision using 92mx10cm graft  . Shoulder open rotator cuff repair Right    Social History:  History   Social History Narrative  . No  narrative on file    Allergies  Allergen Reactions  . Reglan [Metoclopramide] Other (See Comments)    Tardive dyskinesia in 11/2011 in [place]   Family History  Problem Relation Age of Onset  . Diabetes Mother   . Hypertension Mother   . Stroke Mother   . Kidney failure Mother   . Cancer Father    Prior to Admission medications   Medication Sig Start Date End Date Taking? Authorizing Provider  calcium acetate (PHOSLO) 667 MG capsule Take 667-2,001 mg by mouth See admin instructions. Take 2,0075mthree times a day with meals and take 66757m day with snacks   Yes Historical Provider, MD  hydrALAZINE (APRESOLINE) 100 MG tablet Take 100 mg by mouth See admin instructions. Take 100 mg twice a day on dialysis days and take 100 mg four times a day on non dialysis days   Yes Historical Provider, MD  levETIRAcetam (KEPPRA) 1000 MG tablet Take 1,000 mg by mouth daily.    Yes Historical Provider, MD  zolpidem (AMBIEN) 5 MG tablet Take 5 mg by mouth at bedtime.   Yes Historical Provider, MD   Physical Exam: Filed Vitals:   08/21/13 1200 08/21/13 1551 08/21/13 1600 08/21/13 1620  BP: 193/69 123/71 125/71 121/52  Pulse: 95 95  93  Temp: 98.7 F (37.1 C) 100.2 F (37.9 C)  99.5 F (37.5 C)  TempSrc: Oral Oral  Oral  Resp: _0 SpO2: 99% 95%  96%     General:  Alert but he E. African American male arouses and cooperates mild pallor no icterus   Eyes: EOMI, NCAT equally reactive,  ENT:  soft supple no lymphadenopathy moderate dentition   Neck: No JVD  Cardiovascular: S1-S2 murmur left fifth intercostal , PMI displaced, murmur right second intercostal   Respiratory:  clear no added sounds  Abdomen:  soft benign   Skin:  No lower extremity edema  Musculoskeletal: Range of motion intact   Psychiatric: Euthymic   Neurologic:  grossly intact  Labs on Admission:  Basic Metabolic Panel:  Recent Labs Lab 08/21/13 1210  NA 145  K 3.4*  CL 101  CO2 28  GLUCOSE 109*   BUN 50*  CREATININE 5.37*  CALCIUM 8.0*   Liver Function Tests:  Recent Labs Lab 08/21/13 1210  AST 20  ALT 10  ALKPHOS 51  BILITOT 0.3  PROT 7.2  ALBUMIN 2.9*   No results found for this basename: LIPASE, AMYLASE,  in the last 168 hours No results found for this basename: AMMONIA,  in the last 168 hours CBC:  Recent Labs Lab 08/15/13 1008 08/21/13 1210  WBC 4.5 5.5  NEUTROABS 2.8  --   HGB 8.8* 6.1*  HCT 29.1* 19.1*  MCV 85.1 82.3  PLT 218 172   Cardiac Enzymes: No results found for this basename: CKTOTAL, CKMB, CKMBINDEX, TROPONINI,  in the last 168 hours  BNP (last 3 results) No results found for this basename: PROBNP,  in the last 8760 hours CBG: No results found for this basename: GLUCAP,  in the last 168 hours  Radiological Exams on Admission: No results found.  EKG: Independently reviewed.  none performed   Assessment/Plan Principal Problem:   Microcytic anemia-she has borderline microcytic anemia. Have consulted consultant Dr. Ardis Hughs of gastroenterology who will see patient in the morning.  No clear evidence of upper GI or lower GI bleed as asymptomatic--has had multiple Occult stool + in the past.. BUN/creatinine elevated which could indicate an upper source. He has not been scoped in 2 years and does use cocaine. He denies overtly any pain medication use such as NSAIDs. We will defer to gastroenterology regarding workup as he has now been admitted x2 in the past 3-4 months with anemia and has missed outpatient appointments to schedule capsule endoscopy. Appreciate input in advance.  He'll be transfused 2 units packed red blood cells Previous Iron level 01/06/13 were low but cannot repeat  Currently as being transfused A.m. CBC We will ask oncology to see formally and I have placed Dr. Juliann Mule on the treatment team  Active Problems:   ?B-cell lymphoproliferative disorder-see above discussion-chromosomal analysis was neg when done recently   Seizure  disorder-Continue Keppra   Hypertension-stable currently-continue hydralazine on non-dialysis   Diabetes mellitus without complication-Last F2W 5.0  Monitor am cbg   ESRD (end stage renal disease) on dialysis-Alerted Nephrology to patuient bieng here-they are aware   Cocaine abuse-hasn't used   Tobacco abuse-Counsel discussion   GI bleed-see above   Malnutrition of moderate degree-nutrition input   Full code NO family present Time spent: [facility] Pager [phone]  If 7PM-7AM, please contact night-coverage [email or web address] Password [email or web address] 08/21/2013, 5:03 PM

## 2013-08-21 NOTE — Progress Notes (Signed)
Patient's blood pressure and temperature equal 207/84 and 100.6 respectively, 2nd unit of PRBC yet to be transfused. NP on call notified, 20 mg of  Lasix given 30 minutes prior to blood transfusion as ordered. 650 mg of tylenol given for temperature. Will continue to monitor patient.

## 2013-08-21 NOTE — ED Notes (Signed)
Pt arrived by ptar, having generalized fatigue and weakness for around a week, had missed two dialysis treatments. Went for dialysis today, had full treatment. hgb was 6.8 today. No acute distress noted at triage.

## 2013-08-21 NOTE — Progress Notes (Signed)
Admission note: Verified ID with patient name and birth date   Arrival Method: stretcher from ED Mental Orientation: A&O x 4 Telemetry: placed on tele box 7, CCMD notified Assessment: See Doc Flowsheets IV: RFA running blood Pain: denies  Tubes: n/a   Safety Measures: Fall Prevention Safety Plan reviewed with patient.  Patient verbalized understanding and signed 6700 Orientation: Patient has been oriented to the unit, staff and to the room.

## 2013-08-22 ENCOUNTER — Encounter (HOSPITAL_COMMUNITY): Payer: Self-pay | Admitting: Nephrology

## 2013-08-22 DIAGNOSIS — D649 Anemia, unspecified: Secondary | ICD-10-CM

## 2013-08-22 DIAGNOSIS — E119 Type 2 diabetes mellitus without complications: Secondary | ICD-10-CM

## 2013-08-22 DIAGNOSIS — R195 Other fecal abnormalities: Secondary | ICD-10-CM

## 2013-08-22 DIAGNOSIS — G40909 Epilepsy, unspecified, not intractable, without status epilepticus: Secondary | ICD-10-CM

## 2013-08-22 DIAGNOSIS — I10 Essential (primary) hypertension: Secondary | ICD-10-CM

## 2013-08-22 DIAGNOSIS — D62 Acute posthemorrhagic anemia: Secondary | ICD-10-CM

## 2013-08-22 LAB — CBC
HEMATOCRIT: 21.8 % — AB (ref 39.0–52.0)
Hemoglobin: 7.1 g/dL — ABNORMAL LOW (ref 13.0–17.0)
MCH: 27.2 pg (ref 26.0–34.0)
MCHC: 32.6 g/dL (ref 30.0–36.0)
MCV: 83.5 fL (ref 78.0–100.0)
Platelets: 153 10*3/uL (ref 150–400)
RBC: 2.61 MIL/uL — AB (ref 4.22–5.81)
RDW: 17.2 % — ABNORMAL HIGH (ref 11.5–15.5)
WBC: 4.9 10*3/uL (ref 4.0–10.5)

## 2013-08-22 LAB — COMPREHENSIVE METABOLIC PANEL
ALBUMIN: 2.5 g/dL — AB (ref 3.5–5.2)
ALK PHOS: 41 U/L (ref 39–117)
ALT: 10 U/L (ref 0–53)
AST: 18 U/L (ref 0–37)
BUN: 73 mg/dL — AB (ref 6–23)
CO2: 24 mEq/L (ref 19–32)
CREATININE: 6.78 mg/dL — AB (ref 0.50–1.35)
Calcium: 7.5 mg/dL — ABNORMAL LOW (ref 8.4–10.5)
Chloride: 99 mEq/L (ref 96–112)
GFR calc Af Amer: 9 mL/min — ABNORMAL LOW (ref >90)
GFR calc non Af Amer: 8 mL/min — ABNORMAL LOW (ref >90)
Glucose, Bld: 83 mg/dL (ref 70–99)
POTASSIUM: 5.5 meq/L — AB (ref 3.7–5.3)
Sodium: 139 mEq/L (ref 137–147)
Total Bilirubin: 0.7 mg/dL (ref 0.3–1.2)
Total Protein: 6.4 g/dL (ref 6.0–8.3)

## 2013-08-22 LAB — IRON AND TIBC
IRON: 103 ug/dL (ref 42–135)
SATURATION RATIOS: 43 % (ref 20–55)
TIBC: 237 ug/dL (ref 215–435)
UIBC: 134 ug/dL (ref 125–400)

## 2013-08-22 LAB — FOLATE: FOLATE: 9 ng/mL

## 2013-08-22 LAB — RETICULOCYTES
RBC.: 2.62 MIL/uL — ABNORMAL LOW (ref 4.22–5.81)
RETIC COUNT ABSOLUTE: 41.9 10*3/uL (ref 19.0–186.0)
Retic Ct Pct: 1.6 % (ref 0.4–3.1)

## 2013-08-22 LAB — APTT: APTT: 39 s — AB (ref 24–37)

## 2013-08-22 LAB — GLUCOSE, CAPILLARY: Glucose-Capillary: 77 mg/dL (ref 70–99)

## 2013-08-22 LAB — FERRITIN: Ferritin: 244 ng/mL (ref 22–322)

## 2013-08-22 LAB — PREPARE RBC (CROSSMATCH)

## 2013-08-22 LAB — PROTIME-INR
INR: 1.22 (ref 0.00–1.49)
Prothrombin Time: 15.1 seconds (ref 11.6–15.2)

## 2013-08-22 LAB — VITAMIN B12: Vitamin B-12: 455 pg/mL (ref 211–911)

## 2013-08-22 MED ORDER — NA FERRIC GLUC CPLX IN SUCROSE 12.5 MG/ML IV SOLN
125.0000 mg | INTRAVENOUS | Status: DC
  Filled 2013-08-22: qty 10

## 2013-08-22 MED ORDER — DOXERCALCIFEROL 4 MCG/2ML IV SOLN
3.0000 ug | INTRAVENOUS | Status: DC
  Administered 2013-08-25: 3 ug via INTRAVENOUS
  Filled 2013-08-22 (×2): qty 2

## 2013-08-22 MED ORDER — PANTOPRAZOLE SODIUM 40 MG PO TBEC
40.0000 mg | DELAYED_RELEASE_TABLET | Freq: Every day | ORAL | Status: DC
  Administered 2013-08-23 – 2013-08-26 (×4): 40 mg via ORAL
  Filled 2013-08-22 (×4): qty 1

## 2013-08-22 MED ORDER — DARBEPOETIN ALFA-POLYSORBATE 100 MCG/0.5ML IJ SOLN: 100.0000 ug | INTRAMUSCULAR | Status: DC

## 2013-08-22 NOTE — Progress Notes (Signed)
Pt NPO for GI consult.  Possible endo.

## 2013-08-22 NOTE — Progress Notes (Signed)
Pt receiving 1 unit PRBCs'.

## 2013-08-22 NOTE — Progress Notes (Signed)
Blood complete VSS.  BP 179/73 hr 87 after hydralzine IV.  No apparent reaction.

## 2013-08-22 NOTE — Progress Notes (Addendum)
[facility]                                                                              Patient Demographics  [name], is a 58 y.o. male, DOB - January 14, 1956, HAL:[id number]  Admit date - 08/21/2013   Admitting Physician Nita Sells, MD  Outpatient Primary MD for the patient is [name] Kalata, MD  LOS - 1   Chief Complaint  Patient presents with  . Fatigue        Assessment & Plan   Microcytic anemia -Patient was transfused 2 units packed red blood cells, hemoglobin improved from 6.2 to 7.1 -Gastroenterology consulted and appreciated. -Currently n.p.o., pending GI recommendations -It appears the patient has been admitted twice in the past 3-4 months for anemia, patient has missed outpatient appointments for capsule endoscopy -Patient has also been seeing Dr. Juliann Mule, oncologist -Will order another unit to be transfused and continue to monitor CBC -Patient had several anemia panels in the past showing low iron, will obtain another panel  End-stage renal disease requiring hemodialysis -Nephrology consulted and appreciated  Seizure disorder -Continue Keppra  Hypertension -Continue hydralazine  Diabetes mellitus -Last hemoglobin A1c 5 (11/07/13)  Polysubstance/ Cocaine abuse -Counseled  ?B-Cell lymphoproliferative disorder -Dr. Juliann Mule consulted and aware of patient -Patient has been lost to followup -Patient has had 2 bone marrow biopsies  Code Status: Full  Family Communication: None  Disposition Plan: Admitted, pending GI workup  Time Spent in minutes   30 minutes  Procedures none  Consults   Nephrology Gastroenterology Oncology  DVT Prophylaxis  SCDs  Lab Results  Component Value Date   PLT 153 08/22/2013    Medications  Scheduled Meds: . calcium acetate  2,001 mg Oral TID WC  . heparin  5,000 Units Subcutaneous 3 times per day  . hydrALAZINE  100 mg Oral 2 times per day on Tue Thu Sat  . hydrALAZINE  100 mg Oral 4 times  per day on Sun Mon Wed Fri  . levETIRAcetam  1,000 mg Oral Daily  . sodium chloride  3 mL Intravenous Q12H  . zolpidem  5 mg Oral QHS   Continuous Infusions:  PRN Meds:.acetaminophen, calcium acetate, hydrALAZINE, HYDROcodone-acetaminophen  Antibiotics    Anti-infectives   None      Subjective:   [name] seen and examined today.  Patient has no complaints today, denies chest pain, shortness of breath, dizziness or lightheadedness. Patient does state he is hungry. Patient denies any dark stools.  Objective:   Filed Vitals:   08/22/13 0023 08/22/13 0129 08/22/13 0154 08/22/13 0510  BP: 174/76 157/70 157/67 168/73  Pulse: 88 84 82 79  Temp: 98.4 F (36.9 C) 98.8 F (37.1 C) 98.7 F (37.1 C) 98.1 F (36.7 C)  TempSrc: Oral Oral Oral   Resp: 18 18 18 17   Weight:      SpO2: 94% 94% 97% 94%    Wt Readings from Last 3 Encounters:  08/21/13 58.242 kg (128 lb 6.4 oz)  08/01/13 58.242 kg (128 lb 6.4 oz)  07/25/13 63.504 kg (140 lb)     Intake/Output Summary (Last 24 hours) at 08/22/13 0758 Last data filed at 08/22/13 0023  Gross  per 24 hour  Intake   1983 ml  Output      0 ml  Net   1983 ml    Exam  General: Well developed, well nourished, NAD, appears stated age  HEENT: NCAT, PERRLA, EOMI, Anicteic Sclera, mucous membranes moist.   Neck: Supple, no JVD, no masses  Cardiovascular: S1 S2 auscultated, 2/6 SEM. Regular rate and rhythm.  Respiratory: Clear to auscultation bilaterally with equal chest rise  Abdomen: Soft, nontender, nondistended, + bowel sounds  Extremities: warm dry without cyanosis clubbing or edema  Neuro: AAOx3, cranial nerves grossly intact. Strength 5/5 in patient's upper and lower extremities bilaterally  Skin: Without rashes exudates or nodules  Psych: Normal affect and demeanor with intact judgement and insight  Data Review   Micro Results Recent Results (from the past 240 hour(s))  MRSA PCR SCREENING     Status: None    Collection Time    08/21/13  6:42 PM      Result Value Ref Range Status   MRSA by PCR NEGATIVE  NEGATIVE Final   Comment:            The GeneXpert MRSA Assay (FDA     approved for NASAL specimens     only), is one component of a     comprehensive MRSA colonization     surveillance program. It is not     intended to diagnose MRSA     infection nor to guide or     monitor treatment for     MRSA infections.    Radiology Reports No results found.  CBC  Recent Labs Lab 08/15/13 1008 08/21/13 1210 08/21/13 1900 08/22/13 0650  WBC 4.5 5.5 6.9 4.9  HGB 8.8* 6.1* 6.2* 7.1*  HCT 29.1* 19.1* 19.8* 21.8*  PLT 218 172 174 153  MCV 85.1 82.3 82.5 83.5  MCH 25.7* 26.3 25.8* 27.2  MCHC 30.2* 31.9 31.3 32.6  RDW 16.2* 17.6* 17.4* 17.2*  LYMPHSABS 0.9  --   --   --   MONOABS 0.5  --   --   --   EOSABS 0.2  --   --   --   BASOSABS 0.1  --   --   --     Chemistries   Recent Labs Lab 08/21/13 1210 08/21/13 1900 08/22/13 0650  NA 145  --  139  K 3.4*  --  5.5*  CL 101  --  99  CO2 28  --  24  GLUCOSE 109*  --  83  BUN 50*  --  73*  CREATININE 5.37* 6.19* 6.78*  CALCIUM 8.0*  --  7.5*  AST 20  --  18  ALT 10  --  10  ALKPHOS 51  --  41  BILITOT 0.3  --  0.7   ------------------------------------------------------------------------------------------------------------------ CrCl is unknown because both a height and weight (above a minimum accepted value) are required for this calculation. ------------------------------------------------------------------------------------------------------------------ No results found for this basename: HGBA1C,  in the last 72 hours ------------------------------------------------------------------------------------------------------------------ No results found for this basename: CHOL, HDL, LDLCALC, TRIG, CHOLHDL, LDLDIRECT,  in the last 72  hours ------------------------------------------------------------------------------------------------------------------ No results found for this basename: TSH, T4TOTAL, FREET3, T3FREE, THYROIDAB,  in the last 72 hours ------------------------------------------------------------------------------------------------------------------ No results found for this basename: VITAMINB12, FOLATE, FERRITIN, TIBC, IRON, RETICCTPCT,  in the last 72 hours  Coagulation profile  Recent Labs Lab 08/22/13 0650  INR 1.22    No results found for this basename: DDIMER,  in the last  72 hours  Cardiac Enzymes No results found for this basename: CK, CKMB, TROPONINI, MYOGLOBIN,  in the last 168 hours ------------------------------------------------------------------------------------------------------------------ No components found with this basename: POCBNP,     Mikalyn Hermida D.O. on 08/22/2013 at 7:58 AM  Between 7am to 7pm - Pager - [phone]  After 7pm go to [email or web address] - password [email or web address]  And look for the night coverage person covering for me after hours  [facility] Office  [phone]

## 2013-08-22 NOTE — Consult Note (Signed)
Indication for Consultation:  Management of ESRD/hemodialysis; anemia, hypertension/volume and secondary hyperparathyroidism  HPI: [name] is a 58 y.o. male who presented to the ED yesterday from his dialysis center with complaints of fatigue and a hemoglobin of 6.3. He receives HD TTS @ [facility] and had a full tx yesterday after missing the past two treatments to 'take care of business.' He reports he was feeling well until yesterday he was very tired and lightheaded with some sob with activity. He was admitted for further eval and blood transfusion, will keep on TTS HD schedule.   Past Medical History  Diagnosis Date  . Hypertension   . Anemia, chronic disease   . CHF (congestive heart failure)     diastolic.  EF 60 - 65% per [facility] eco 11/2011  . Cocaine abuse     mentioned in notes from [place]  . Hepatitis C antibody test positive     was HIV negative, 02/28/12  . Hepatitis B core antibody positive     03/01/10  . Positive QuantiFERON-TB Gold test     11/2011  . Helicobacter pylori gastritis     not defined if this was treated  . Polyp of colon, adenomatous     May 2012.  Dr Trenton Founds in [place]  . Shortness of breath   . Hematochezia   . Head injury, closed, with concussion   . History of blood transfusion     "last one was 2 days ago" (11/09/2012)  . Type II diabetes mellitus     on oral pills only  . Arthritis     "right shoulder" (11/09/2012)  . Seizure disorder     questionable history of - will need to clarify with PCP  . Headache(784.0)     "q other day" (11/09/2012)  . ESRD (end stage renal disease) on dialysis since 2012    "[place]; TTS" (11/09/2012)  . Hypercalcemia   . Hyperpotassemia   . Seizures    Past Surgical History  Procedure Laterality Date  . Shoulder open rotator cuff repair Right   . Total knee arthroplasty Left   . Bascilic vein transposition  03/07/2012    Procedure: BASCILIC VEIN TRANSPOSITION;  Surgeon: Conrad Creston, MD;  Location:  [place];  Service: Vascular;  Laterality: Left;  First Stage  . Insertion of dialysis catheter      right chest  . Bascilic vein transposition Left 05/31/2012    Procedure: BASCILIC VEIN TRANSPOSITION;  Surgeon: Conrad Forks, MD;  Location: [place];  Service: Vascular;  Laterality: Left;  Left 2nd Stage Basilic Vein Transposition with gortex graft revision using 63mmx10cm graft  . Shoulder open rotator cuff repair Right    Family History  Problem Relation Age of Onset  . Diabetes Mother   . Hypertension Mother   . Stroke Mother   . Kidney failure Mother   . Cancer Father    Social History:  reports that he has been smoking Cigarettes.  He has a 6.25 pack-year smoking history. He has never used smokeless tobacco. He reports that he does not drink alcohol or use illicit drugs. Allergies  Allergen Reactions  . Reglan [Metoclopramide] Other (See Comments)    Tardive dyskinesia in 11/2011 in [place]   Prior to Admission medications   Medication Sig Start Date End Date Taking? Authorizing Provider  calcium acetate (PHOSLO) 667 MG capsule Take 667-2,001 mg by mouth See admin instructions. Take 2,001mg  three times a day with meals and take 667mg  a  day with snacks   Yes Historical Provider, MD  hydrALAZINE (APRESOLINE) 100 MG tablet Take 100 mg by mouth See admin instructions. Take 100 mg twice a day on dialysis days and take 100 mg four times a day on non dialysis days   Yes Historical Provider, MD  levETIRAcetam (KEPPRA) 1000 MG tablet Take 1,000 mg by mouth daily.    Yes Historical Provider, MD  zolpidem (AMBIEN) 5 MG tablet Take 5 mg by mouth at bedtime.   Yes Historical Provider, MD   Current Facility-Administered Medications  Medication Dose Route Frequency Provider Last Rate Last Dose  . acetaminophen (TYLENOL) tablet 650 mg  650 mg Oral Q6H PRN Gardiner Barefoot, NP   650 mg at 08/22/13 0913  . calcium acetate (PHOSLO) capsule 2,001 mg  2,001 mg Oral TID WC Nita Sells, MD      .  calcium acetate (PHOSLO) capsule 667 mg  667 mg Oral PRN Nita Sells, MD      . hydrALAZINE (APRESOLINE) injection 5 mg  5 mg Intravenous Q4H PRN Gardiner Barefoot, NP   5 mg at 08/22/13 0041  . hydrALAZINE (APRESOLINE) tablet 100 mg  100 mg Oral 2 times per day on Tue Thu Sat Nita Sells, MD   100 mg at 08/21/13 2232  . hydrALAZINE (APRESOLINE) tablet 100 mg  100 mg Oral 4 times per day on Sun Mon Wed Fri Nita Sells, MD   100 mg at 08/22/13 4034  . HYDROcodone-acetaminophen (NORCO/VICODIN) 5-325 MG per tablet 1 tablet  1 tablet Oral Q6H PRN Gardiner Barefoot, NP   1 tablet at 08/21/13 2034  . levETIRAcetam (KEPPRA) tablet 1,000 mg  1,000 mg Oral Daily Nita Sells, MD   1,000 mg at 08/22/13 0916  . sodium chloride 0.9 % injection 3 mL  3 mL Intravenous Q12H Nita Sells, MD   3 mL at 08/22/13 0916  . zolpidem (AMBIEN) tablet 5 mg  5 mg Oral QHS Nita Sells, MD   5 mg at 08/21/13 2232   Labs: Basic Metabolic Panel:  Recent Labs Lab 08/21/13 1210 08/21/13 1900 08/22/13 0650  NA 145  --  139  K 3.4*  --  5.5*  CL 101  --  99  CO2 28  --  24  GLUCOSE 109*  --  83  BUN 50*  --  73*  CREATININE 5.37* 6.19* 6.78*  CALCIUM 8.0*  --  7.5*   Liver Function Tests:  Recent Labs Lab 08/21/13 1210 08/22/13 0650  AST 20 18  ALT 10 10  ALKPHOS 51 41  BILITOT 0.3 0.7  PROT 7.2 6.4  ALBUMIN 2.9* 2.5*   No results found for this basename: LIPASE, AMYLASE,  in the last 168 hours No results found for this basename: AMMONIA,  in the last 168 hours CBC:  Recent Labs Lab 08/21/13 1210 08/21/13 1900 08/22/13 0650  WBC 5.5 6.9 4.9  HGB 6.1* 6.2* 7.1*  HCT 19.1* 19.8* 21.8*  MCV 82.3 82.5 83.5  PLT 172 174 153   Cardiac Enzymes: No results found for this basename: CKTOTAL, CKMB, CKMBINDEX, TROPONINI,  in the last 168 hours CBG:  Recent Labs Lab 08/22/13 0814  GLUCAP 77   Iron Studies: No results found for this basename: IRON,  TIBC, TRANSFERRIN, FERRITIN,  in the last 72 hours Studies/Results: No results found.   Review of Systems: Gen: Reports fatigue starting yesterday, improved this AM. Denies any fever, chills, sweats, anorexia, weakness, malaise, weight loss, and sleep disorder  HEENT: No visual complaints, No history of Retinopathy. Normal external appearance No Epistaxis or Sore throat. No sinusitis.   CV: Denies chest pain, angina, palpitations, syncope, orthopnea, PND, peripheral edema, and claudication. Resp: Reports dyspnea with exercise. Denies dyspnea at rest, cough, sputum, wheezing, coughing up blood, and pleurisy. GI: Denies vomiting blood, jaundice, and fecal incontinence.   Denies dysphagia or odynophagia. Denies abdominal pain or blood in stool GU : Denies urinary burning, blood in urine, urinary frequency, urinary hesitancy, nocturnal urination, and urinary incontinence.  No renal calculi. MS: Denies joint pain, limitation of movement, and swelling, stiffness, low back pain, extremity pain. Denies muscle weakness, cramps, atrophy.  No use of non steroidal antiinflammatory drugs. Derm: Denies rash, itching, dry skin, hives, moles, warts, or unhealing ulcers.  Psych: Denies depression, anxiety, memory loss, suicidal ideation, hallucinations, paranoia, and confusion. Heme: Denies bruising, bleeding, and enlarged lymph nodes. Neuro: No headache.  No diplopia. No dysarthria.  No dysphasia.  No history of CVA.  No Seizures. No paresthesias.  No weakness. Endocrine No DM.  No Thyroid disease.  No Adrenal disease.  Physical Exam: Filed Vitals:   08/22/13 0129 08/22/13 0154 08/22/13 0510 08/22/13 0815  BP: 157/70 157/67 168/73 163/70  Pulse: 84 82 79 86  Temp: 98.8 F (37.1 C) 98.7 F (37.1 C) 98.1 F (36.7 C) 98.3 F (36.8 C)  TempSrc: Oral Oral  Oral  Resp: 18 18 17 18   Weight:      SpO2: 94% 97% 94% 95%     General: Well developed, well nourished, in no acute distress.  Head: Normocephalic,  atraumatic, sclera non-icteric, mucus membranes are moist Neck: Supple. JVD not elevated. No bruits Lungs: Clear bilaterally to auscultation without wheezes, rales, or rhonchi. Breathing is unlabored. Heart: RRR with S1 S2. No murmurs, rubs, or gallops appreciated. Abdomen: Soft, non-tender, non-distended with normoactive bowel sounds. No rebound/guarding. No obvious abdominal masses. M-S:  Strength and tone appear normal for age. Lower extremities:without edema or ischemic changes, no open wounds  Neuro: Alert and oriented X 3. Moves all extremities spontaneously. Psych:  Responds to questions appropriately with a normal affect. Agitated that he is NPO, difficult to reason with.  Dialysis Access: L AVF + bruit/thrill  Dialysis Orders: TTS @ [facility] 57.5kg  2K/2.25Ca   4hr   180  400/1.5   L AVF hectrol 3 mcg IV/HD Aranesp100  Units IV/weekly (started 5/5) Venofer  100 q HD x10 (stop 5/19)   Assessment/Plan: 1.  Anemia- hgb 7.1, up from 6.2 s/p 2u PRBC, Aranesp 118mcg Q Tuesday and Fe Q HD (tsat 10 and ferritin 47, from 4/23). GI consulted, missed outpt capsule endoscopy in the past. No heparin in HD. 2.  ESRD -  TTS @ [facility], HD pending tomrrow 3.  Hypertension/volume  - 163/70, cont hydralazine 4.  Metabolic bone disease -  Ca+ 7.5, phos 5.3, cont phoslo and hectrol PTH 47 (from 4/23) 5.  Nutrition - alb 2.5, NPO for ? Endo 6. Seizure disorder- on keppra,  7.    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   Shelle Iron, NP [facility] [phone] 08/22/2013, 11:21 AM   Pt seen, examined, agree w assess/plan as above with additions as indicated.  Kelly Splinter MD pager [phone]    cell [phone] 08/22/2013, 2:03 PM

## 2013-08-22 NOTE — Progress Notes (Signed)
INITIAL NUTRITION ASSESSMENT  DOCUMENTATION CODES Per approved criteria  -Severe malnutrition in the context of chronic illness   INTERVENTION: Recommend Resource Breeze po BID, each supplement provides 250 kcal and 9 grams of protein, once diet advances. Recommend Renal MVI once diet advances. RD to continue to follow nutrition care plan.  NUTRITION DIAGNOSIS: Increased nutrient needs related to HD and chronic medical issues as evidenced by estimated needs.   Goal: Intake to meet >90% of estimated nutrition needs.  Monitor:  weight trends, lab trends, I/O's, PO intake, supplement tolerance  Reason for Assessment: Malnutrition Screening Tool  58 y.o. male  Admitting Dx: Microcytic anemia  ASSESSMENT: PMHx significant for ESRD (HD TTS), HTN, DM2, cocaine abuse, anemia. Admitted from HD center with weakness and malaise. Work-up reveals microcytic anemia.  Per H&P, pt seen by oncology as outpatient and BM bx noted potential B cell proliferative disorder, suggesting potential follicular lymphoma.  Patient is currently NPO for potential GI evaluation/procedure. Pt with hx of refusing Nepro Shakes and being on liberalized diet while hospitalized. Pt is very angry when speaking with me. He reports that he is hungry and is being starved. I asked him about his recent weight loss of 9% in the past month and he states that it is secondary to [place] starving him. Pt asking RD to leave.   Nutrition Focused Physical Exam:   Subcutaneous Fat:  Orbital Region: moderate wasting  Upper Arm Region: severe wasting  Thoracic and Lumbar Region: mild-moderate wasting   Muscle:  Temple Region: WNL  Clavicle Bone Region: mild-moderate wasting  Clavicle and Acromion Bone Region: mild-moderate wasting  Scapular Bone Region: WNL  Dorsal Hand: WNL  Patellar Region: moderate wasting  Anterior Thigh Region: moderate wasting  Posterior Calf Region: moderate-severe wasting   Edema: none present   Pt  meets criteria for severe MALNUTRITION in the context of chronic illness as evidenced by 9% wt loss x 1 month and severe wasting.  Potassium elevated at 5.5  Height: Ht Readings from Last 1 Encounters:  08/01/13 5\' 9"  (1.753 m)    Weight: Wt Readings from Last 1 Encounters:  08/21/13 128 lb 6.4 oz (58.242 kg)    Ideal Body Weight: 160 lb  % Ideal Body Weight: 80%  Wt Readings from Last 10 Encounters:  08/21/13 128 lb 6.4 oz (58.242 kg)  08/01/13 128 lb 6.4 oz (58.242 kg)  07/25/13 140 lb (63.504 kg)  07/07/13 135 lb (61.236 kg)  06/24/13 139 lb 12.4 oz (63.4 kg)  06/12/13 140 lb (63.504 kg)  05/28/13 137 lb 3.2 oz (62.234 kg)  02/12/13 132 lb 14.4 oz (60.283 kg)  01/29/13 137 lb 14.4 oz (62.551 kg)  01/09/13 137 lb 2 oz (62.2 kg)    Usual Body Weight: 140 lb  % Usual Body Weight: 91%  BMI:  Body mass index is 18.95 kg/(m^2). WNL  Estimated Nutritional Needs: Kcal: 1750 - 2000 Protein: at least 87 g daily Fluid: 1.2 liters  Skin: intact  Diet Order: NPO  EDUCATION NEEDS: -No education needs identified at this time   Intake/Output Summary (Last 24 hours) at 08/22/13 0946 Last data filed at 08/22/13 0023  Gross per 24 hour  Intake   1983 ml  Output      0 ml  Net   1983 ml    Last BM: 5/5  Labs:   Recent Labs Lab 08/21/13 1210 08/21/13 1900 08/22/13 0650  NA 145  --  139  K 3.4*  --  5.5*  CL 101  --  99  CO2 28  --  24  BUN 50*  --  73*  CREATININE 5.37* 6.19* 6.78*  CALCIUM 8.0*  --  7.5*  GLUCOSE 109*  --  83    CBG (last 3)   Recent Labs  08/22/13 0814  GLUCAP 77    Scheduled Meds: . calcium acetate  2,001 mg Oral TID WC  . hydrALAZINE  100 mg Oral 2 times per day on Tue Thu Sat  . hydrALAZINE  100 mg Oral 4 times per day on Sun Mon Wed Fri  . levETIRAcetam  1,000 mg Oral Daily  . sodium chloride  3 mL Intravenous Q12H  . zolpidem  5 mg Oral QHS    Continuous Infusions:   Past Medical History  Diagnosis Date  .  Hypertension   . Anemia, chronic disease   . CHF (congestive heart failure)     diastolic.  EF 60 - 65% per [facility] eco 11/2011  . Cocaine abuse     mentioned in notes from [place]  . Hepatitis C antibody test positive     was HIV negative, 02/28/12  . Hepatitis B core antibody positive     03/01/10  . Positive QuantiFERON-TB Gold test     11/2011  . Helicobacter pylori gastritis     not defined if this was treated  . Polyp of colon, adenomatous     May 2012.  Dr Trenton Founds in [place]  . Shortness of breath   . Hematochezia   . Head injury, closed, with concussion   . History of blood transfusion     "last one was 2 days ago" (11/09/2012)  . Type II diabetes mellitus     on oral pills only  . Arthritis     "right shoulder" (11/09/2012)  . Seizure disorder     questionable history of - will need to clarify with PCP  . Headache(784.0)     "q other day" (11/09/2012)  . ESRD (end stage renal disease) on dialysis since 2012    "[place]; TTS" (11/09/2012)  . Hypercalcemia   . Hyperpotassemia   . Seizures     Past Surgical History  Procedure Laterality Date  . Shoulder open rotator cuff repair Right   . Total knee arthroplasty Left   . Bascilic vein transposition  03/07/2012    Procedure: BASCILIC VEIN TRANSPOSITION;  Surgeon: Conrad [place], MD;  Location: [place];  Service: Vascular;  Laterality: Left;  First Stage  . Insertion of dialysis catheter      right chest  . Bascilic vein transposition Left 05/31/2012    Procedure: BASCILIC VEIN TRANSPOSITION;  Surgeon: Conrad [place], MD;  Location: [place];  Service: Vascular;  Laterality: Left;  Left 2nd Stage Basilic Vein Transposition with gortex graft revision using 68mmx10cm graft  . Shoulder open rotator cuff repair Right     Inda Coke MS, RD, LDN Inpatient Registered Dietitian Pager: [phone] After-hours pager: [phone]

## 2013-08-22 NOTE — Progress Notes (Signed)
Ptt systollic bo 237 for gave 5 mg IV  hydrallazinefor systollic.

## 2013-08-22 NOTE — Consult Note (Signed)
[facility] Consult: 9:56 AM 08/22/2013  LOS: 1 day    Referring Provider: Dr Loralee Pacas.  Primary Care Physician:  Windy Kalata, MD Primary Gastroenterologist: Dr. Trenton Founds in [place], Henrene Pastor 11/14, Deatra Ina 1/1, gessner 10/2012.     Reason for Consultation: Acute on chronic anemia.   HPI: [name] is a 58 y.o. male. Has ESRD and relocated to [facility] from [place] in November 2013. Hep C positive.  Seen as inpt in November 2013 by Dr Henrene Pastor for normocytic anemia. Records from pt's previous GI Dr Posey Pronto reviewed and no further workup pursued. Refused bone marrow biopsy 03/07/13 ordered by Dr Berlinda Last.   Transfusions of multiple units of blood on these admissions. 4 in 02/2013 (hgb 5.3), 3 in 04/2012 (Hgb 6.6).  2 in 10/2012 (hgb 6.2)  tox screen cocaine + in 10/2012 and on multiple admissions to [facility] before relocating to [place].   Seen 1/16 by Dr Deatra Ina for inpt consult. Re: limited rectal bleeding, anemia. Dr Deatra Ina did not pursue further GI workup. Pt non-compliant with Heme, vascular surgery follow up and missed HD sessions regularly.  Seen for 3rd time by GI, Dr Carlean Purl in 10/2012 after admission with 6.2 Hgb, weakness, FOBT +. Decision against pursuing capsule study, as results unlikely to change management.   4th inpt GI consult 12/2012, Dr Henrene Pastor, Hgb 6.5 and FOBT +. Transfused 5 units blood.  Using ASA powders and not compliant with PPI.  No further GI studies pursued.   Bone marrow biopsy 12/2012: Followed up outpt with heme, Dr Juliann Mule, for normocytic anemia. Assessment:  #1 Minor B cell population (14% of lymphocytes) but no constitutional sxs of lymphoma. #2 Anemia of chronic disease.  Was treated with IV Iron   Office visit with Dr Henrene Pastor 05/2013: Pt no showed for scheduled 06/19/13 capsule  endo.  FOBT + 07/07/13. Transfused blood in 07/2013. Pt says no transfusion since then.   Now admitted with Hgb of 6.1, no FOBT tested yet.  He complains of fatigue and weakness.  n Says stools are loose, chronically, not prolific and not melenic.  Low grade fever of 100.2 in ED.   No nausea, no dysphagia, no anorexia.   Complicating his care is peripatetic life style, travelling and residing back and forth between [place] and [place].     ENDOSCOPIC STUDIES:  In [place]:  Enteroscopy 03/26/2011 Dr Earley Brooke. Negative study. He refused colonoscopy. Capsule endo entertained  EGD May 2012: Small HH,  EGD 2011: gastritis  Colonoscopy May 2012: 21m adenomatous sigmoid polyp     Past Medical History  Diagnosis Date  . Hypertension   . Anemia, chronic disease   . CHF (congestive heart failure)     diastolic.  [facility]  . [place]     [place] Hepatitis C antibody test positive     was HIV negative, 02/28/12  . Hepatitis B core antibody positive     03/01/10  . Positive QuantiFERON-TB Gold test     11/2011  .  Helicobacter pylori gastritis     not defined if this was treated  . Polyp of colon, adenomatous     May 2012.  Dr Trenton Founds in [place]  . Shortness of breath   . Hematochezia   . Head injury, closed, with concussion   . History of blood transfusion     "last one was 2 days ago" (11/09/2012)  . Type II diabetes mellitus     on oral pills only  . Arthritis     "right shoulder" (11/09/2012)  . Seizure disorder     questionable history of - will need to clarify with PCP  . Headache(784.0)     "q other day" (11/09/2012)  . ESRD (end stage renal disease) on dialysis since 2012    "[place]; TTS" (11/09/2012)  . Hypercalcemia   . Hyperpotassemia   . Seizures     Past Surgical History  Procedure Laterality Date  . Shoulder open rotator cuff repair Right   . Total knee arthroplasty Left   . Bascilic vein  transposition  03/07/2012    Procedure: BASCILIC VEIN TRANSPOSITION;  Surgeon: Conrad Junction, MD;  Location: [place];  Service: Vascular;  Laterality: Left;  First Stage  . Insertion of dialysis catheter      right chest  . Bascilic vein transposition Left 05/31/2012    Procedure: BASCILIC VEIN TRANSPOSITION;  Surgeon: Conrad Wiley Ford, MD;  Location: [place];  Service: Vascular;  Laterality: Left;  Left 2nd Stage Basilic Vein Transposition with gortex graft revision using 25mx10cm graft  . Shoulder open rotator cuff repair Right     Prior to Admission medications   Medication Sig Start Date End Date Taking? Authorizing Provider  calcium acetate (PHOSLO) 667 MG capsule Take 667-2,001 mg by mouth See admin instructions. Take 2,0033mthree times a day with meals and take 6673m day with snacks   Yes Historical Provider, MD  hydrALAZINE (APRESOLINE) 100 MG tablet Take 100 mg by mouth See admin instructions. Take 100 mg twice a day on dialysis days and take 100 mg four times a day on non dialysis days   Yes Historical Provider, MD  levETIRAcetam (KEPPRA) 1000 MG tablet Take 1,000 mg by mouth daily.    Yes Historical Provider, MD  zolpidem (AMBIEN) 5 MG tablet Take 5 mg by mouth at bedtime.   Yes Historical Provider, MD    Scheduled Meds: . calcium acetate  2,001 mg Oral TID WC  . hydrALAZINE  100 mg Oral 2 times per day on Tue Thu Sat  . hydrALAZINE  100 mg Oral 4 times per day on Sun Mon Wed Fri  . levETIRAcetam  1,000 mg Oral Daily  . sodium chloride  3 mL Intravenous Q12H  . zolpidem  5 mg Oral QHS   Infusions:   PRN Meds: acetaminophen, calcium acetate, hydrALAZINE, HYDROcodone-acetaminophen   Allergies as of 08/21/2013 - Review Complete 08/21/2013  Allergen Reaction Noted  . Reglan [metoclopramide] Other (See Comments) 03/02/2012    Family History  Problem Relation Age of Onset  . Diabetes Mother   . Hypertension Mother   . Stroke Mother   . Kidney failure Mother   . Cancer Father       History   Social History  . Marital Status: Single    Spouse Name: N/A    Number of Children: 1  . Years of Education: N/A   Occupational History  . Unemployed    Social History Main Topics  . Smoking  status: Current Every Day Smoker -- 0.25 packs/day for 25 years    Types: Cigarettes  . Smokeless tobacco: Never Used  . Alcohol Use: No     Comment: 11/09/2012 "been stopped drinking 1-2 yr ago"  . Drug Use: No     Comment: 11/09/2012 "used weed > 20 yr ago; smokes crack-cocaine- last time ~ 5 months ago"  . Sexual Activity: Yes   Other Topics Concern  . Not on file   Social History Narrative  . No narrative on file    REVIEW OF SYSTEMS: Per HPI.  12 system review with pertinent findings per HPI   PHYSICAL EXAM: Vital signs in last 24 hours: Filed Vitals:   08/22/13 0815  BP: 163/70  Pulse: 86  Temp: 98.3 F (36.8 C)  Resp: 18   Wt Readings from Last 3 Encounters:  08/21/13 58.242 kg (128 lb 6.4 oz)  08/01/13 58.242 kg (128 lb 6.4 oz)  07/25/13 63.504 kg (140 lb)    General: somewhat ill looking, slightly cranky AAM.  Comfortable, asking for food Head:  No swelling or asymmetry  Eyes:  No icterus or pallor.  Ears:  Not HOH  Nose:  No congestion Mouth:  Clear, moist Neck:  No mass, no JVD Lungs:  Clear.  No cough or SOB Heart: RRR.  No MRG Abdomen:  Soft, ND, NT,  No mass, no splenomegaly.  Liver edge at costal margin.   Rectal: deferred   Musc/Skeltl: no joint swelling or contracture Extremities:  No CCE  Neurologic:  Oriented x 3.  No limb weakness.  Skin:  Dry, no open sores Tattoos:  On left arm Nodes:  No cervical adenopathy.    Psych:  Cooperative. Relaxed.   Intake/Output from previous day: 05/05 0701 - 05/06 0700 In: 1983 [P.O.:300; I.V.:1000; Blood:683] Out: -  Intake/Output this shift:    LAB RESULTS:  Recent Labs  08/21/13 1210 08/21/13 1900 08/22/13 0650  WBC 5.5 6.9 4.9  HGB 6.1* 6.2* 7.1*  HCT 19.1* 19.8* 21.8*  PLT  172 174 153   BMET Lab Results  Component Value Date   NA 139 08/22/2013   NA 145 08/21/2013   NA 147* 08/01/2013   K 5.5* 08/22/2013   K 3.4* 08/21/2013   K 4.1 08/01/2013   CL 99 08/22/2013   CL 101 08/21/2013   CL 98 07/24/2013   CO2 24 08/22/2013   CO2 28 08/21/2013   CO2 32* 08/01/2013   GLUCOSE 83 08/22/2013   GLUCOSE 109* 08/21/2013   GLUCOSE 84 08/01/2013   BUN 73* 08/22/2013   BUN 50* 08/21/2013   BUN 22.7 08/01/2013   CREATININE 6.78* 08/22/2013   CREATININE 6.19* 08/21/2013   CREATININE 5.37* 08/21/2013   CALCIUM 7.5* 08/22/2013   CALCIUM 8.0* 08/21/2013   CALCIUM 9.2 08/01/2013   LFT  Recent Labs  08/21/13 1210 08/22/13 0650  PROT 7.2 6.4  ALBUMIN 2.9* 2.5*  AST 20 18  ALT 10 10  ALKPHOS 51 41  BILITOT 0.3 0.7   PT/INR Lab Results  Component Value Date   INR 1.22 08/22/2013   INR 0.95 07/07/2013   INR 1.00 01/02/2013   Hepatitis Panel No results found for this basename: HEPBSAG, HCVAB, HEPAIGM, HEPBIGM,  in the last 72 hours C-Diff No components found with this basename: cdiff   Lipase     Component Value Date/Time   LIPASE 61* 05/05/2012 0147    Drugs of Abuse     Component Value Date/Time  LABOPIA NONE DETECTED 11/07/2012 1903   COCAINSCRNUR POSITIVE* 11/07/2012 1903   LABBENZ NONE DETECTED 11/07/2012 1903   AMPHETMU NONE DETECTED 11/07/2012 1903   THCU NONE DETECTED 11/07/2012 1903   LABBARB NONE DETECTED 11/07/2012 1903     RADIOLOGY STUDIES: No results found.    IMPRESSION:   *  Chronic, recurrent, transfusion requiring anemia, multifactorial. On Aranesp weekly at HD.  Getting regular Venofer through 5/19 at HD (per heme rec) Extensive GI workups in past. Significant hx of non-compliance with suggested GI testing.  Hx gastritis 2011, not compliant with PPI at home.  It was on med list at 06/2013 discharge.  Last heme visit 4/15  *  ESRD.  TTS dialysis schedule.   *  Cocaine abuse, latest tox screen back in 10/2012.  Needs to be checked again.  *   Hep C +.  No hx  cirrhosis. LFTs normal.  *  B cell lymphoproliferative disorder.   Dr Juliann Mule following.     PLAN:     *  Capsule endoscopy ? In vs outpt.  Transfuse as needed *  Ordered urine drug screen.  Start back on once daily Protonix.  *  Start renal diet, ordered. Vena Rua  08/22/2013, 9:56 AM Pager: [phone]

## 2013-08-22 NOTE — Consult Note (Signed)
Patient seen, examined, and I agree with the above documentation, including the assessment and plan. VCE, inpatient if patient unlikely to followup in the office Hematology followup given finding at previous bone marrow bx

## 2013-08-22 NOTE — Progress Notes (Signed)
Temperature equals 99.2 after 650 mg of tylenol. Will continue to monitor patient.

## 2013-08-23 DIAGNOSIS — N186 End stage renal disease: Secondary | ICD-10-CM

## 2013-08-23 DIAGNOSIS — F1410 Cocaine abuse, uncomplicated: Secondary | ICD-10-CM

## 2013-08-23 DIAGNOSIS — F17200 Nicotine dependence, unspecified, uncomplicated: Secondary | ICD-10-CM

## 2013-08-23 DIAGNOSIS — D509 Iron deficiency anemia, unspecified: Secondary | ICD-10-CM

## 2013-08-23 DIAGNOSIS — Z992 Dependence on renal dialysis: Secondary | ICD-10-CM

## 2013-08-23 LAB — CBC
HCT: 20.9 % — ABNORMAL LOW (ref 39.0–52.0)
HEMOGLOBIN: 6.8 g/dL — AB (ref 13.0–17.0)
MCH: 26.7 pg (ref 26.0–34.0)
MCHC: 32.5 g/dL (ref 30.0–36.0)
MCV: 82 fL (ref 78.0–100.0)
Platelets: 153 10*3/uL (ref 150–400)
RBC: 2.55 MIL/uL — AB (ref 4.22–5.81)
RDW: 16.7 % — ABNORMAL HIGH (ref 11.5–15.5)
WBC: 5.2 10*3/uL (ref 4.0–10.5)

## 2013-08-23 LAB — RENAL FUNCTION PANEL
Albumin: 2.5 g/dL — ABNORMAL LOW (ref 3.5–5.2)
BUN: 117 mg/dL — ABNORMAL HIGH (ref 6–23)
CHLORIDE: 93 meq/L — AB (ref 96–112)
CO2: 23 mEq/L (ref 19–32)
Calcium: 8.2 mg/dL — ABNORMAL LOW (ref 8.4–10.5)
Creatinine, Ser: 8.58 mg/dL — ABNORMAL HIGH (ref 0.50–1.35)
GFR calc Af Amer: 7 mL/min — ABNORMAL LOW (ref >90)
GFR, EST NON AFRICAN AMERICAN: 6 mL/min — AB (ref >90)
GLUCOSE: 124 mg/dL — AB (ref 70–99)
POTASSIUM: 5.6 meq/L — AB (ref 3.7–5.3)
Phosphorus: 4.4 mg/dL (ref 2.3–4.6)
Sodium: 133 mEq/L — ABNORMAL LOW (ref 137–147)

## 2013-08-23 LAB — PREPARE RBC (CROSSMATCH)

## 2013-08-23 LAB — HEMOGLOBIN AND HEMATOCRIT, BLOOD
HCT: 21.9 % — ABNORMAL LOW (ref 39.0–52.0)
Hemoglobin: 7.1 g/dL — ABNORMAL LOW (ref 13.0–17.0)

## 2013-08-23 LAB — GLUCOSE, CAPILLARY: GLUCOSE-CAPILLARY: 221 mg/dL — AB (ref 70–99)

## 2013-08-23 MED ORDER — DARBEPOETIN ALFA-POLYSORBATE 100 MCG/0.5ML IJ SOLN
100.0000 ug | INTRAMUSCULAR | Status: DC
  Administered 2013-08-23: 100 ug via INTRAVENOUS
  Filled 2013-08-23: qty 0.5

## 2013-08-23 MED ORDER — DARBEPOETIN ALFA-POLYSORBATE 100 MCG/0.5ML IJ SOLN
INTRAMUSCULAR | Status: AC
  Filled 2013-08-23: qty 0.5

## 2013-08-23 MED ORDER — DOXERCALCIFEROL 4 MCG/2ML IV SOLN
INTRAVENOUS | Status: AC
  Administered 2013-08-23: 3 ug
  Filled 2013-08-23: qty 2

## 2013-08-23 MED ORDER — RENA-VITE PO TABS
1.0000 | ORAL_TABLET | Freq: Every day | ORAL | Status: DC
  Administered 2013-08-24 – 2013-08-25 (×3): 1 via ORAL
  Administered 2013-08-26: 21:00:00 via ORAL
  Administered 2013-08-27: 1 via ORAL
  Filled 2013-08-23 (×5): qty 1

## 2013-08-23 NOTE — Progress Notes (Signed)
[facility]                                                                              Patient Demographics  [name], is a 58 y.o. male, DOB - 1955/08/27, IRS:[id number]  Admit date - 08/21/2013   Admitting Physician Nita Sells, MD  Outpatient Primary MD for the patient is Windy Kalata, MD  LOS - 2   Chief Complaint  Patient presents with  . Fatigue        Assessment & Plan   Normacytic anemia -Patient was transfused 3 units packed red blood cells, hemoglobin improved from 6.2 to 7.1 -Gastroenterology consulted and appreciated, and recommended outpatient capsule endoscopy. -It appears the patient has been admitted twice in the past 3-4 months for anemia, patient has missed outpatient appointments for capsule endoscopy -Patient has also been seeing Dr. Juliann Mule, oncologist -Will ask for another unit of blood to be transfused during dialysis -Anemia panel: Iron 103, ferritin 244  End-stage renal disease requiring hemodialysis -Nephrology consulted and appreciated  Seizure disorder -Continue Keppra  Hypertension -Continue hydralazine  Diabetes mellitus -Last hemoglobin A1c 5 (11/07/13)  Polysubstance/ Cocaine abuse -Counseled -pending urine drug screen  ?B-Cell lymphoproliferative disorder -Dr. Juliann Mule consulted and aware of patient -Patient has been lost to followup -Patient has had 2 bone marrow biopsies  Code Status: Full  Family Communication: None  Disposition Plan: Admitted, patient will be dialyzed today and likely discharge 5/8  Time Spent in minutes   25 minutes  Procedures none  Consults   Nephrology Gastroenterology Oncology  DVT Prophylaxis  SCDs  Lab Results  Component Value Date   PLT 153 08/22/2013    Medications  Scheduled Meds: . calcium acetate  2,001 mg Oral TID WC  . [START ON 08/28/2013] darbepoetin (ARANESP) injection - DIALYSIS  100 mcg Intravenous Q Tue-HD  . doxercalciferol  3 mcg Intravenous Q  T,Th,Sa-HD  . ferric gluconate (FERRLECIT/NULECIT) IV  125 mg Intravenous Q T,Th,Sa-HD  . hydrALAZINE  100 mg Oral 2 times per day on Tue Thu Sat  . hydrALAZINE  100 mg Oral 4 times per day on Sun Mon Wed Fri  . levETIRAcetam  1,000 mg Oral Daily  . pantoprazole  40 mg Oral Q0600  . sodium chloride  3 mL Intravenous Q12H  . zolpidem  5 mg Oral QHS   Continuous Infusions:  PRN Meds:.acetaminophen, calcium acetate, hydrALAZINE, HYDROcodone-acetaminophen  Antibiotics    Anti-infectives   None      Subjective:   [name] seen and examined today.  Patient has no complaints today, other than being hungry.  He inquires about the capsule endoscopy.  He denies dark stools, abdominal pain, chest pain, shortness of breath, dizziness or lightheadedness.  Objective:   Filed Vitals:   08/22/13 2149 08/23/13 0102 08/23/13 0159 08/23/13 0459  BP: 176/70 197/80 177/78 178/73  Pulse: 72 86 84 81  Temp:    98.6 F (37 C)  TempSrc:    Oral  Resp: 16   17  Weight:      SpO2:    95%    Wt Readings from Last 3 Encounters:  08/21/13 58.242 kg (128 lb 6.4 oz)  08/01/13 58.242 kg (128 lb 6.4 oz)  07/25/13 63.504 kg (140 lb)     Intake/Output Summary (Last 24 hours) at 08/23/13 0804 Last data filed at 08/22/13 2300  Gross per 24 hour  Intake    240 ml  Output      0 ml  Net    240 ml    Exam  General: Well developed, well nourished, NAD, appears stated age  HEENT: NCAT,  mucous membranes moist.   Neck: Supple, no JVD, no masses  Cardiovascular: S1 S2 auscultated, 2/6 SEM. Regular rate and rhythm.  Respiratory: Clear to auscultation bilaterally with equal chest rise  Abdomen: Soft, nontender, nondistended, + bowel sounds  Extremities: warm dry without cyanosis clubbing or edema  Neuro: AAOx3, No focal deficits  Skin: Without rashes exudates or nodules  Psych: Normal affect and demeanor with intact judgement and insight  Data Review   Micro Results Recent Results  (from the past 240 hour(s))  MRSA PCR SCREENING     Status: None   Collection Time    08/21/13  6:42 PM      Result Value Ref Range Status   MRSA by PCR NEGATIVE  NEGATIVE Final   Comment:            The GeneXpert MRSA Assay (FDA     approved for NASAL specimens     only), is one component of a     comprehensive MRSA colonization     surveillance program. It is not     intended to diagnose MRSA     infection nor to guide or     monitor treatment for     MRSA infections.    Radiology Reports No results found.  CBC  Recent Labs Lab 08/21/13 1210 08/21/13 1900 08/22/13 0650 08/23/13 0551  WBC 5.5 6.9 4.9  --   HGB 6.1* 6.2* 7.1* 7.1*  HCT 19.1* 19.8* 21.8* 21.9*  PLT 172 174 153  --   MCV 82.3 82.5 83.5  --   MCH 26.3 25.8* 27.2  --   MCHC 31.9 31.3 32.6  --   RDW 17.6* 17.4* 17.2*  --     Chemistries   Recent Labs Lab 08/21/13 1210 08/21/13 1900 08/22/13 0650  NA 145  --  139  K 3.4*  --  5.5*  CL 101  --  99  CO2 28  --  24  GLUCOSE 109*  --  83  BUN 50*  --  73*  CREATININE 5.37* 6.19* 6.78*  CALCIUM 8.0*  --  7.5*  AST 20  --  18  ALT 10  --  10  ALKPHOS 51  --  41  BILITOT 0.3  --  0.7   ------------------------------------------------------------------------------------------------------------------ CrCl is unknown because both a height and weight (above a minimum accepted value) are required for this calculation. ------------------------------------------------------------------------------------------------------------------ No results found for this basename: HGBA1C,  in the last 72 hours ------------------------------------------------------------------------------------------------------------------ No results found for this basename: CHOL, HDL, LDLCALC, TRIG, CHOLHDL, LDLDIRECT,  in the last 72 hours ------------------------------------------------------------------------------------------------------------------ No results found for this  basename: TSH, T4TOTAL, FREET3, T3FREE, THYROIDAB,  in the last 72 hours ------------------------------------------------------------------------------------------------------------------  Recent Labs  08/22/13 1223  VITAMINB12 455  FOLATE 9.0  FERRITIN 244  TIBC 237  IRON 103  RETICCTPCT 1.6    Coagulation profile  Recent Labs Lab 08/22/13 0650  INR 1.22    No results found for this basename: DDIMER,  in the last 72 hours  Cardiac Enzymes No  results found for this basename: CK, CKMB, TROPONINI, MYOGLOBIN,  in the last 168 hours ------------------------------------------------------------------------------------------------------------------ No components found with this basename: POCBNP,     Louisiana Searles D.O. on 08/23/2013 at 8:04 AM  Between 7am to 7pm - Pager - [phone]  After 7pm go to [email or web address] - password [email or web address]  And look for the night coverage person covering for me after hours  [facility] Office  [phone]

## 2013-08-23 NOTE — Progress Notes (Signed)
Utilization review completed. Jermell Holeman, RN, BSN. 

## 2013-08-23 NOTE — Progress Notes (Signed)
5 mg of Hydralazine given for blood pressure of 197/80. Blood pressure equals 177/78 after hydralazine. Will continue to monitor.

## 2013-08-23 NOTE — Progress Notes (Signed)
          Daily Rounding Note  08/23/2013, 1:04 PM  LOS: 2 days   SUBJECTIVE:       No complaints.  Ate solid lunch.  No BMs today.   OBJECTIVE:         Vital signs in last 24 hours:    Temp:  [97.9 F (36.6 C)-99.1 F (37.3 C)] 98.1 F (36.7 C) (05/07 0903) Pulse Rate:  [62-92] 79 (05/07 0903) Resp:  [16-18] 18 (05/07 0903) BP: (176-197)/(70-83) 186/71 mmHg (05/07 0903) SpO2:  [93 %-96 %] 96 % (05/07 0903) Last BM Date: 08/22/13 General: NAD   Heart: RRR Chest: clear, no SOB Abdomen: soft, NT, ND.  No HSM or mass.  BS active  Extremities: no CCE Neuro/Psych:  Cooperative, not confused. Not somnolent.   Intake/Output from previous day: 05/06 0701 - 05/07 0700 In: 240 [P.O.:240] Out: -   Intake/Output this shift:    Lab Results:  Recent Labs  08/21/13 1210 08/21/13 1900 08/22/13 0650 08/23/13 0551  WBC 5.5 6.9 4.9  --   HGB 6.1* 6.2* 7.1* 7.1*  HCT 19.1* 19.8* 21.8* 21.9*  PLT 172 174 153  --    BMET  Recent Labs  08/21/13 1210 08/21/13 1900 08/22/13 0650  NA 145  --  139  K 3.4*  --  5.5*  CL 101  --  99  CO2 28  --  24  GLUCOSE 109*  --  83  BUN 50*  --  73*  CREATININE 5.37* 6.19* 6.78*  CALCIUM 8.0*  --  7.5*   Scheduled Meds: . calcium acetate  2,001 mg Oral TID WC  . darbepoetin (ARANESP) injection - DIALYSIS  100 mcg Intravenous Q Thu-HD  . doxercalciferol  3 mcg Intravenous Q T,Th,Sa-HD  . hydrALAZINE  100 mg Oral 2 times per day on Tue Thu Sat  . hydrALAZINE  100 mg Oral 4 times per day on Sun Mon Wed Fri  . levETIRAcetam  1,000 mg Oral Daily  . multivitamin  1 tablet Oral QHS  . pantoprazole  40 mg Oral Q0600  . sodium chloride  3 mL Intravenous Q12H  . zolpidem  5 mg Oral QHS   Continuous Infusions:  PRN Meds:.acetaminophen, calcium acetate, hydrALAZINE, HYDROcodone-acetaminophen   ASSESMENT:   * Chronic, recurrent, transfusion requiring anemia, multifactorial. On Aranesp  weekly at HD. Getting regular Venofer through 5/19 at HD (per heme rec)  Extensive GI workups in past.  Significant hx of non-compliance with suggested and scheduled GI testing.  Hx gastritis 2011, not compliant with PPI at home. It was on med list at 06/2013 discharge.  Last heme visit 4/15  * ESRD. TTS dialysis schedule.  * Cocaine abuse, latest tox screen back in 10/2012. Needs to be checked again.  * Hep C +. No hx cirrhosis. LFTs normal.  * B cell lymphoproliferative disorder. Dr Juliann Mule following.     PLAN   *  Trying to arrange capsule endo but he ate solid diet.  Could start 8AM tomorrow but would need to stay until 8 PM when recorder can be removed.  *  Daily PPI going forward.     [name] [name]  08/23/2013, 1:04 PM Pager: [phone]

## 2013-08-23 NOTE — Progress Notes (Addendum)
[name]   DOB:05-May-1955   SE#:[id number]   WVP#:[id number]  Subjective:Patient eating dinner.  States that he is awaiting dialysis.  He denies chest pain or acute shortness of breath.    Objective:  Filed Vitals:   08/23/13 0903  BP: 186/71  Pulse: 79  Temp: 98.1 F (36.7 C)  Resp: 18    Body mass index is 18.95 kg/(m^2).  Intake/Output Summary (Last 24 hours) at 08/23/13 1751 Last data filed at 08/22/13 2300  Gross per 24 hour  Intake    240 ml  Output      0 ml  Net    240 ml   Thin, chronically ill appearing  Sclerae unicteric  Oropharynx poor dentition  No peripheral adenopathy in axillary, neck and supraclavicular  Lungs clear -- no rales or rhonchi  Heart regular rate and rhythm  Abdomen benign, no enlarged spleen  MSK no peripheral edema, L UE fistula  Neuro nonfocal    CBG (last 3)   Recent Labs  08/22/13 0814 08/23/13 0900  GLUCAP 77 221*     Labs:  Lab Results  Component Value Date   WBC 4.9 08/22/2013   HGB 7.1* 08/23/2013   HCT 21.9* 08/23/2013   MCV 83.5 08/22/2013   PLT 153 08/22/2013   NEUTROABS 2.8 5/46/2703   Basic Metabolic Panel:  Recent Labs Lab 08/21/13 1210 08/21/13 1900 08/22/13 0650  NA 145  --  139  K 3.4*  --  5.5*  CL 101  --  99  CO2 28  --  24  GLUCOSE 109*  --  83  BUN 50*  --  73*  CREATININE 5.37* 6.19* 6.78*  CALCIUM 8.0*  --  7.5*   GFR The CrCl is unknown because both a height and weight (above a minimum accepted value) are required for this calculation. Liver Function Tests:  Recent Labs Lab 08/21/13 1210 08/22/13 0650  AST 20 18  ALT 10 10  ALKPHOS 51 41  BILITOT 0.3 0.7  PROT 7.2 6.4  ALBUMIN 2.9* 2.5*   No results found for this basename: LIPASE, AMYLASE,  in the last 168 hours No results found for this basename: AMMONIA,  in the last 168 hours Coagulation profile  Recent Labs Lab 08/22/13 0650  INR 1.22    CBC:  Recent Labs Lab 08/21/13 1210 08/21/13 1900 08/22/13 0650 08/23/13 0551   WBC 5.5 6.9 4.9  --   HGB 6.1* 6.2* 7.1* 7.1*  HCT 19.1* 19.8* 21.8* 21.9*  MCV 82.3 82.5 83.5  --   PLT 172 174 153  --   CBG:  Recent Labs Lab 08/22/13 0814 08/23/13 0900  GLUCAP 77 221*   Anemia work up  Recent Labs  08/22/13 1223  VITAMINB12 455  FOLATE 9.0  FERRITIN 244  TIBC 237  IRON 103  RETICCTPCT 1.6   Microbiology Recent Results (from the past 240 hour(s))  MRSA PCR SCREENING     Status: None   Collection Time    08/21/13  6:42 PM      Result Value Ref Range Status   MRSA by PCR NEGATIVE  NEGATIVE Final   Comment:            The GeneXpert MRSA Assay (FDA     approved for NASAL specimens     only), is one component of a     comprehensive MRSA colonization     surveillance program. It is not     intended to diagnose MRSA  infection nor to guide or     monitor treatment for     MRSA infections.    Results for BLESS, BELSHE (MRN [id number]) as of 08/23/2013 17:54  Ref. Range 08/22/2013 12:23  Iron Latest Range: 42-135 ug/dL 103  UIBC Latest Range: 125-400 ug/dL 134  TIBC Latest Range: 215-435 ug/dL 237  Saturation Ratios Latest Range: 20-55 % 43  Ferritin Latest Range: 22-322 ng/mL 244  Folate No range found 9.0  Vitamin B-12 Latest Range: 211-911 pg/mL 455    PATHOLOGY: 06/25/2013 Diagnosis Bone Marrow, Aspirate,Biopsy, and Clot, right iliac - NORMOCELLULAR BONE MARROW FOR AGE WITH TRILINEAGE HEMATOPOIESIS. - SEVERAL LYMPHOID AGGREGATES PRESENT. - SEE COMMENT. PERIPHERAL BLOOD: - NORMOCYTIC-NORMOCHROMIC ANEMIA. Diagnosis Note The overall morphologic and phenotypic features are similar to previous bone marrow ([id number]) and worrisome for minimal involvement by a B-cell lymphoproliferative process which cannot be excluded. As in the previous material, the changes are limited and not considered definitive or diagnostic of malignancy. Clinical correlation is recommended. Susanne Greenhouse MD Pathologist, Electronic  Signature  Interpretation Bone Marrow Flow Cytometry - PREDOMINANCE OF T LYMPHOCYTES WITH NO ABNORMAL PHENOTYPE. - MINOR B CELL POPULATION PRESENT. - SEE NOTE. Diagnosis Comment: Analysis of the lymphoid population shows predominance of T lymphocytes with no aberrant phenotype. B cells represent a minor population (17% of lymphocytes) with expression of pan B cell antigens but with extremely dim/negative staining for surface immunoglobulin light chains that hinder assessment and/or quantitation of clonality. However, an abnormal B cell phenotype such as co-expression of CD5 and CD20 or CD10 expression is not identified. Clinical correlation is recommended. (BNS:gt, 06/28/13) Susanne Greenhouse MD Pathologist, Electronic Signature Studies:   CYTOGENETICS: Normal.   No results found.  Assessment: 58 y.o.  Plan:  1. Minor B cell population (17% of lymphocytes)  --We reviewed his pathology with Dr. Gari Crown of pathology on prior admission. 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 He suggested a repeat biopsy while he is in patient.  Repeat bone marrow biopsy demonstrated similar findings not definitive of a malignancy.  I was following his labs to avoid readmission but he was lost to follow.   He does not have lymphadenopathy on exam. He does not have constitutional symptoms such as night sweats, fevers or weight lost. We will likely require staging imaging to assess for lymph nodes as an outpatient if possible. It is reassuring that his WBC and plts remain normal. Low retic points away from hemolysis; iron studies less consistent with iron deficiency  2. Anemia of mixed etiology (History of GI bleed, Anemia of chronic kidney disease) --Agree with renal to maximize epo with dialysis and transfuse to maintain a hemoglobin of 8.  Bone marrow notes normocellular bone marrow for age with trilineage hematopoiesis with lymphoid aggregates (as noted above).  Agree with GI  for capsule endoscopy given multiple positive FOBT.  Given patient's non-compliance and evident my prior no shows with my office, future therapy will be challenging. Transfuse prn symptoms of anemia.   3. ESRDz on dialysis.  --As noted above, he will continue his dialysis. He has a history of non-compliance.   4. Polysubstance abuse including crack cocaine, tobacco abuse and a history of non-compliance.    We will follow this patient with you.    Concha Norway, MD 08/23/2013  5:51 PM

## 2013-08-23 NOTE — Progress Notes (Signed)
Patient seen, examined, and I agree with the above documentation, including the assessment and plan. Video capsule not available tomorrow. Will schedule RN visit for pt to come to [facility], receive instructions on video capsule study and schedule this test as recommended by his GI MD, Dr. Henrene Pastor Call with questions

## 2013-08-23 NOTE — Progress Notes (Signed)
Subjective:   Feeling well. No sob, not as tired. No complaints  Objective Filed Vitals:   08/23/13 0102 08/23/13 0159 08/23/13 0459 08/23/13 0903  BP: 197/80 177/78 178/73 186/71  Pulse: 86 84 81 79  Temp:   98.6 F (37 C) 98.1 F (36.7 C)  TempSrc:   Oral Oral  Resp:   17 18  Weight:      SpO2:   95% 96%   Physical Exam General: alert and oriented. No acute distress Heart: RRR Lungs: CTA, unlabored Abdomen: soft, nontender, +BS Extremities: no edeam Dialysis Access: L AVF +bruit/thrill  Dialysis Orders: TTS @ AF  57.5kg 2K/2.25Ca 4hr 180 400/1.5 L AVF  hectrol 3 mcg IV/HD Aranesp100 Units IV/weekly (started 5/5) Venofer 100 q HD x10 (stop 5/19)   Assessment/Plan:  1. Anemia- hgb 7.1, up from 6.2 s/p 3u PRBC, will transfuse 1 u in Hd today. Aranesp 123mcg Q Tuesday and Fe Q HD (tsat 10 and ferritin 47, from 4/23). GI consulted, missed outpt capsule endoscopy in the past. No heparin in HD. Started protonix 2. ESRD - TTS @ AF, HD today. 3. Hypertension/volume - 186/71, cont hydralazine 4. Metabolic bone disease - Ca+ 7.5, phos 5.3, cont phoslo and hectrol PTH 47 (from 4/23) 5. Nutrition - alb 2.5, Renal diet, mulitvit 6. 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    Shelle Iron, NP [place] [phone] 08/23/2013,9:06 AM  LOS: 2 days   Pt seen, examined and agree w A/P as above.  Kelly Splinter MD pager [phone]    cell [phone] 08/23/2013, 1:47 PM    Additional Objective Labs: Basic Metabolic Panel:  Recent Labs Lab 08/21/13 1210 08/21/13 1900 08/22/13 0650  NA 145  --  139  K 3.4*  --  5.5*  CL 101  --  99  CO2 28  --  24  GLUCOSE 109*  --  83  BUN 50*  --  73*  CREATININE 5.37* 6.19* 6.78*  CALCIUM 8.0*  --  7.5*   Liver Function Tests:  Recent Labs Lab 08/21/13 1210 08/22/13 0650  AST 20 18  ALT 10 10  ALKPHOS 51 41  BILITOT 0.3 0.7  PROT 7.2 6.4  ALBUMIN 2.9* 2.5*    No results found for this basename: LIPASE, AMYLASE,  in the last 168 hours CBC:  Recent Labs Lab 08/21/13 1210 08/21/13 1900 08/22/13 0650 08/23/13 0551  WBC 5.5 6.9 4.9  --   HGB 6.1* 6.2* 7.1* 7.1*  HCT 19.1* 19.8* 21.8* 21.9*  MCV 82.3 82.5 83.5  --   PLT 172 174 153  --    Blood Culture    Component Value Date/Time   SDES URINE, RANDOM 11/07/2012 1903   SPECREQUEST NONE 11/07/2012 1903   CULT NO GROWTH 11/07/2012 1903   REPTSTATUS 11/08/2012 FINAL 11/07/2012 1903    Cardiac Enzymes: No results found for this basename: CKTOTAL, CKMB, CKMBINDEX, TROPONINI,  in the last 168 hours CBG:  Recent Labs Lab 08/22/13 0814  GLUCAP 77   Iron Studies:  Recent Labs  08/22/13 1223  IRON 103  TIBC 237  FERRITIN 244   @lablastinr3 @ Studies/Results: No results found. Medications:   . calcium acetate  2,001 mg Oral TID WC  . [START ON 08/28/2013] darbepoetin (ARANESP) injection - DIALYSIS  100 mcg Intravenous Q Tue-HD  . doxercalciferol  3 mcg Intravenous Q T,Th,Sa-HD  . ferric gluconate (FERRLECIT/NULECIT) IV  125 mg Intravenous Q T,Th,Sa-HD  . hydrALAZINE  100 mg Oral 2 times per day on Tue Thu Sat  . hydrALAZINE  100 mg Oral 4 times per day on Sun Mon Wed Fri  . levETIRAcetam  1,000 mg Oral Daily  . pantoprazole  40 mg Oral Q0600  . sodium chloride  3 mL Intravenous Q12H  . zolpidem  5 mg Oral QHS

## 2013-08-23 NOTE — Progress Notes (Signed)
Results for [name], [name] (MRN [id number]) as of 08/23/2013 20:21  Ref. Range 08/23/2013 19:24  Hemoglobin Latest Range: 13.0-17.0 g/dL 6.8 (LL)  CRITICAL VALUE ALERT  Critical value received: Hemoglobin 6.8  Date of notification:  08/23/13  Time of notification: 2013H  Critical value read back:yes  Nurse who received alert:  Georgeanna Harrison, RN  MD notified (1st page): Tylene Fantasia  Time of first page:  2017H  MD notified (2nd page):  Time of second page:  Responding MD:  Tylene Fantasia  Time MD responded:  2040  Result called to Hemodialysis RN Judeen Hammans) at around [phone] since pt. is presently in Hemodialysis when the lab called. Pt. is currently receiving 1 unit of blood.

## 2013-08-24 ENCOUNTER — Encounter: Payer: Self-pay | Admitting: Internal Medicine

## 2013-08-24 LAB — HEPATITIS B SURFACE ANTIGEN: HEP B S AG: NEGATIVE

## 2013-08-24 LAB — CBC
HCT: 23.6 % — ABNORMAL LOW (ref 39.0–52.0)
HEMOGLOBIN: 7.6 g/dL — AB (ref 13.0–17.0)
MCH: 26.7 pg (ref 26.0–34.0)
MCHC: 32.2 g/dL (ref 30.0–36.0)
MCV: 82.8 fL (ref 78.0–100.0)
PLATELETS: 176 10*3/uL (ref 150–400)
RBC: 2.85 MIL/uL — ABNORMAL LOW (ref 4.22–5.81)
RDW: 16.6 % — ABNORMAL HIGH (ref 11.5–15.5)
WBC: 4.9 10*3/uL (ref 4.0–10.5)

## 2013-08-24 LAB — GLUCOSE, CAPILLARY: Glucose-Capillary: 95 mg/dL (ref 70–99)

## 2013-08-24 LAB — BASIC METABOLIC PANEL
BUN: 42 mg/dL — ABNORMAL HIGH (ref 6–23)
CO2: 27 mEq/L (ref 19–32)
Calcium: 8.2 mg/dL — ABNORMAL LOW (ref 8.4–10.5)
Chloride: 99 mEq/L (ref 96–112)
Creatinine, Ser: 4.12 mg/dL — ABNORMAL HIGH (ref 0.50–1.35)
GFR calc Af Amer: 17 mL/min — ABNORMAL LOW (ref >90)
GFR, EST NON AFRICAN AMERICAN: 15 mL/min — AB (ref >90)
GLUCOSE: 99 mg/dL (ref 70–99)
Potassium: 4.2 mEq/L (ref 3.7–5.3)
SODIUM: 138 meq/L (ref 137–147)

## 2013-08-24 LAB — RAPID URINE DRUG SCREEN, HOSP PERFORMED
Amphetamines: NOT DETECTED
Barbiturates: NOT DETECTED
Benzodiazepines: NOT DETECTED
COCAINE: POSITIVE — AB
OPIATES: NOT DETECTED
Tetrahydrocannabinol: NOT DETECTED

## 2013-08-24 LAB — PREPARE RBC (CROSSMATCH)

## 2013-08-24 MED ORDER — BOOST / RESOURCE BREEZE PO LIQD
1.0000 | Freq: Two times a day (BID) | ORAL | Status: DC
  Administered 2013-08-26 (×2): 1 via ORAL

## 2013-08-24 MED ORDER — RENA-VITE PO TABS: 1.0000 | ORAL_TABLET | Freq: Every day | ORAL | Status: DC

## 2013-08-24 MED ORDER — PANTOPRAZOLE SODIUM 40 MG PO TBEC: 40.0000 mg | DELAYED_RELEASE_TABLET | Freq: Every day | ORAL | Status: DC

## 2013-08-24 NOTE — Progress Notes (Signed)
Subjective:  No complaints, feeling much better  Objective: Vital signs in last 24 hours: Temp:  [97.4 F (36.3 C)-98.5 F (36.9 C)] 98.5 F (36.9 C) (05/08 0534) Pulse Rate:  [75-84] 78 (05/08 0534) Resp:  [16-22] 17 (05/08 0534) BP: (156-209)/(71-89) 159/71 mmHg (05/08 0534) SpO2:  [96 %-98 %] 97 % (05/08 0534) Weight:  [57.4 kg (126 lb 8.7 oz)] 57.4 kg (126 lb 8.7 oz) (05/07 2300) Weight change:   Intake/Output from previous day: 05/07 0701 - 05/08 0700 In: 695 [P.O.:360; Blood:335] Out: 4619 [Urine:75]   Lab Results:  Recent Labs  08/22/13 0650 08/23/13 0551 08/23/13 1924  WBC 4.9  --  5.2  HGB 7.1* 7.1* 6.8*  HCT 21.8* 21.9* 20.9*  PLT 153  --  153   BMET:  Recent Labs  08/22/13 0650 08/23/13 1925  NA 139 133*  K 5.5* 5.6*  CL 99 93*  CO2 24 23  GLUCOSE 83 124*  BUN 73* 117*  CREATININE 6.78* 8.58*  CALCIUM 7.5* 8.2*  ALBUMIN 2.5* 2.5*   No results found for this basename: PTH,  in the last 72 hours Iron Studies:  Recent Labs  08/22/13 1223  IRON 103  TIBC 237  FERRITIN 244   EXAM: General appearance:  Alert, in no apparent distress Resp:  CTA without rales, rhonchi, or wheezes Cardio:  RRR without murmur or rub GI:  + BS, soft and nontender Extremities:  No edema Access:  AVF @ LUA with + bruit  Dialysis Orders: TTS @ AF  57.5kg 2K/2.25Ca 4hr 180 400/1.5 L AVF  hectrol 3 mcg IV/HD Aranesp100 Units IV/weekly (started 5/5) Venofer 100 q HD x10 (stop 5/19)   Studies/Results: No results found.  Assessment/Plan: 1. Anemia - Hgb 6.8 yesterday prior to 1 U PRBCs, s/p 2 U 5/5, on Aranesp 100 mcg on Thurs; capsule endoscopy to be scheduled per GI as outpatient. Multifactorial with prob recurrent GIB, anemia of CKD and possible bone marrow dysfunction contributing; needs another transfusion before d/c, 2u ordered for today 2. ESRD - HD on TTS @ AF, K 5.6 pre-HD yesterday.  Next HD tomorrow. 3. HTN/Volume - BP 159/71 on Hydralazine; wt 57.4 kg s/p  net UF 4.5 L yesterday. 4. Sec HPT - Ca 8.2 (9.4 corrected), P 4.4; Hectorol 3 mcg, Phoslo with meals. 5. Nutrition - Alb 2.5, renal diet, vitamin. 6. Seizure disorder - on Keppra. 7. B-cell lymphoproliferative disorder - followed by Dr. Juliann Mule, but frequently misses outpatient appts.    LOS: 3 days   Ramiro Harvest 08/24/2013,7:48 AM  Pt seen, examined, agree w assess/plan as above with additions as indicated.  Kelly Splinter MD pager [phone]    cell [phone] 08/24/2013, 12:30 PM

## 2013-08-24 NOTE — Discharge Instructions (Signed)
Anemia, Nonspecific Anemia is a condition in which the concentration of red blood cells or hemoglobin in the blood is below normal. Hemoglobin is a substance in red blood cells that carries oxygen to the tissues of the body. Anemia results in not enough oxygen reaching these tissues.  CAUSES  Common causes of anemia include:   Excessive bleeding. Bleeding may be internal or external. This includes excessive bleeding from periods (in women) or from the intestine.   Poor nutrition.   Chronic kidney, thyroid, and liver disease.  Bone marrow disorders that decrease red blood cell production.  Cancer and treatments for cancer.  HIV, AIDS, and their treatments.  Spleen problems that increase red blood cell destruction.  Blood disorders.  Excess destruction of red blood cells due to infection, medicines, and autoimmune disorders. SIGNS AND SYMPTOMS   Minor weakness.   Dizziness.   Headache.  Palpitations.   Shortness of breath, especially with exercise.   Paleness.  Cold sensitivity.  Indigestion.  Nausea.  Difficulty sleeping.  Difficulty concentrating. Symptoms may occur suddenly or they may develop slowly.  DIAGNOSIS  Additional blood tests are often needed. These help your health care provider determine the best treatment. Your health care provider will check your stool for blood and look for other causes of blood loss.  TREATMENT  Treatment varies depending on the cause of the anemia. Treatment can include:   Supplements of iron, vitamin F57, or folic acid.   Hormone medicines.   A blood transfusion. This may be needed if blood loss is severe.   Hospitalization. This may be needed if there is significant continual blood loss.   Dietary changes.  Spleen removal. HOME CARE INSTRUCTIONS Keep all follow-up appointments. It often takes many weeks to correct anemia, and having your health care provider check on your condition and your response to  treatment is very important. SEEK IMMEDIATE MEDICAL CARE IF:   You develop extreme weakness, shortness of breath, or chest pain.   You become dizzy or have trouble concentrating.  You develop heavy vaginal bleeding.   You develop a rash.   You have bloody or black, tarry stools.   You faint.   You vomit up blood.   You vomit repeatedly.   You have abdominal pain.  You have a fever or persistent symptoms for more than 2 3 days.   You have a fever and your symptoms suddenly get worse.   You are dehydrated.  MAKE SURE YOU:  Understand these instructions.  Will watch your condition.  Will get help right away if you are not doing well or get worse. Document Released: 05/13/2004 Document Revised: 12/06/2012 Document Reviewed: 09/29/2012 [facility] Patient Information 2014 [place]. Capsule Endoscopy Capsule endoscopy is a procedure used to examine the inside of the small intestine. It is done to detect problems or abnormalities, such as bleeding, inflammation, or growths. You will swallow a small capsule that has a tiny camera and transmitter in it. This capsule naturally travels through your digestive system, and the camera takes photos of the small intestine along the way. The photos are transmitted to a recorder so that your health care provider can later view the photos and look for problems. LET [facility] CARE PROVIDER KNOW ABOUT:   Any allergies you have.  All medications you are taking, including vitamins, herbs, eyedrops, creams, and over-the-counter medications.  Use of steroids (by mouth or creams).  Any blood disorders you have.  Previous abdominal surgeries you have had.  A  history of intestinal blockage.  A history of Crohn disease or ulcerative colitis.  A pacemaker or implantable heart defibrillator if you have one.  Smoking history.  Possibility of pregnancy, if this applies.  Other health problems you have. RISKS AND  COMPLICATIONS Generally, capsule endoscopy is a safe procedure. However, as with any procedure, complications can occur. Possible complications include the capsule becoming trapped in the digestive tract, which can result in an intestinal blockage. BEFORE THE PROCEDURE  Imaging tests may be done, such as X-rays, CT scans, or an MRI.  Ask your health care provider about changing or stopping your regular medications.  You may be asked to limit your diet to clear liquids for 24 hours prior to the test.  You will be asked to follow a bowel preparation cleansing routine to empty out your intestines. If the intestines are not adequately cleansed, the procedure may fail to get useful photos and may need to be repeated.  Do not eat or drink anything for 10 hours before the start of the procedure or as directed by your health care provider. PROCEDURE  Your health care provider will attach sensors to your abdomen. You will then be asked to put on a belt that contains a recording device. After turning on the camera inside the capsule, the health care provider will give you the capsule to swallow. You then swallow the capsule with water, just like taking a pill. Over the next several hours, the capsule will naturally travel through your digestive system and take photos of the small intestine. The photos will be transmitted to the recording device on your belt. After the specified number of hours (usually 8 or 12), you should take off the sensors and belt and return them to your health care provider as directed. Your health care provider will retrieve the images taken and examine the photos on a computer.  AFTER SWALLOWING THE CAPSULE  After you swallow the capsule, you will be able to leave and carry on with your regular daily activities.  You should not feel any pain or discomfort while the capsule is traveling through your digestive system. Contact your health care provider if you develop nausea or  vomiting or if you feel pain or bloating in the abdomen.  You may drink clear liquids 2 hours after swallowing the capsule.  You may eat lightly 4 hours after swallowing the capsule. You can return to your regular diet after the testing period.  You can resume normal activities right after swallowing the capsule.  Avoid strenuous activity, such as running, during the testing period.  Only remove the sensors and recording device as directed by your health care provider.  Return the recording device to your health care provider as directed.  The capsule will be naturally eliminated from the body during a bowel movement. Look for the capsule in your bowel movements over the next few days.  The capsule can be safely flushed down the toilet.  Avoid MRI equipment until the capsule has been eliminated from your body.  Follow up with your health care provider to get the results. Document Released: 12/06/2012 Document Reviewed: 12/06/2012 [facility] Patient Information 2014 [place].

## 2013-08-24 NOTE — Discharge Summary (Signed)
Physician Discharge Summary  Json Koelzer VEL:[id number] DOB: 08-06-1955 DOA: 08/21/2013  PCP: Windy Kalata, MD  Admit date: 08/21/2013 Discharge date: 08/24/2013  Time spent: 45 minutes  Recommendations for Outpatient Follow-up:  Patient will be discharged to home. [name] to followup with his primary care physician within one week of discharge as well as Dr. Henrene Pastor for capsule endoscopy. Patient should continue his dialysis as scheduled. He should continue his medications as prescribed. Patient should also follow up with Dr. Juliann Mule. Patient was counseled on the need for compliance with his appointments.  Discharge Diagnoses:  Normacytic anemia  End-stage renal disease requiring hemodialysis Seizure disorder Hypertension Diabetes mellitus Polysubstance plus cocaine abuse ? B-cell lymphoproliferative disorder  Discharge Condition: Stable  Diet recommendation: Renal diet with 200 mL fluid restriction per day  [facility] Weights   08/21/13 2008 08/23/13 2300  Weight: 58.242 kg (128 lb 6.4 oz) 57.4 kg (126 lb 8.7 oz)    History of present illness:  [name] is a 58 y.o. male known h/o ESRD t/th/sat [[facility]], htn, dm ty2, Prior Cocaine abuse, Anemia Renal disease with blood loss component-supposed to get Capsule by Dr. Henrene Pastor as OP after 07/07/13 admit [actually had Capsule/EGd and colonoscopy in [place] 2012], h/o Sz, prior TB exposure, Htn, came to [facility] ed straight from his dialysis center [facility] for weakness for malaise.  He denied any overt nausea vomiting abdominal pain but had 1-2 episodes of diarrhea. He stated that he has chronic diarrhea without any specific etiology that he knows of. He stated that this happens occasionally when he eats certain types of foods. He has had no falls or dizziness he had no dark or tarry stool he has no specific abdominal pain he has no fever no chills or shortness of breath although a low-grade fever is noted in the emergency room. He had no  chest pain yesterday sputum he denies any unilateral weakness. Review of records reveals that he was seen by Dr. Henrene Pastor 05/28/13 and his workup was done and it was encouraged that he followup with oncology which he did .  Oncology notes as well as bone marrow biopsies did not potential B cell proliferative disorder as of 08/01/13 note . This suggested potential follicular lymphoma but this was inconclusive a repeat bone marrow biopsy was scheduled but I don't think it was done as yet. Upon admission he is a poor historian and is sleepy at bedside in saying that he is weak and therefore is a little less cooperative with exam.  He states that he has not used cocaine he lives with friends in [place] and doesn't drive.   Hospital Course:  Normacytic anemia  -Patient was transfused 4 units packed red blood cells, hemoglobin improved from 6.2 to 7.6 -Gastroenterology consulted and appreciated, and recommended outpatient capsule endoscopy.  -It appears the patient has been admitted twice in the past 3-4 months for anemia, patient has missed outpatient appointments for capsule endoscopy  -Patient admitted that he probably will not go to the appointment for his capsule endoscopy.  Patient was counseled and patient agreed that he would try to make it to his appointment. Although patient has had a history of noncompliance. -Patient has also been seeing Dr. Juliann Mule, oncologist, recommended maximizing epo with dialysis  -Will ask for another unit of blood to be transfused during dialysis  -Anemia panel: Iron 103, ferritin 244   End-stage renal disease requiring hemodialysis  -Nephrology consulted and appreciated   Seizure disorder  -Continue Keppra  Hypertension  -Continue hydralazine   Diabetes mellitus  -Last hemoglobin A1c 5 (11/07/13)   Polysubstance/ Cocaine abuse  -Counseled   ?B-Cell lymphoproliferative disorder  -Dr. Juliann Mule consulted and aware of patient  -Patient has been lost to followup    -Patient has had 2 bone marrow biopsies   Procedures: None  Consultations: Nephrology  Gastroenterology  Oncology  Discharge Exam: Filed Vitals:   08/24/13 0825  BP: 164/63  Pulse: 77  Temp: 98.1 F (36.7 C)  Resp: 16   Exam  General: Well developed, well nourished, NAD, appears stated age  HEENT: NCAT, mucous membranes moist.  Neck: Supple, no JVD, no masses  Cardiovascular: S1 S2 auscultated, 2/6 SEM. Regular rate and rhythm.  Respiratory: Clear to auscultation bilaterally with equal chest rise  Abdomen: Soft, nontender, nondistended, + bowel sounds  Extremities: warm dry without cyanosis clubbing or edema  Neuro: AAOx3, No focal deficits  Skin: Without rashes exudates or nodules  Psych: Normal affect and demeanor with intact judgement and insight  Discharge Instructions      Discharge Orders   Future Appointments Provider Department Dept Phone   08/31/2013 10:00 AM [facility] Only [facility] [phone]   08/31/2013 10:30 AM [facility] Provider [facility] [phone]   09/06/2013 9:15 AM [facility] [phone]   Future Orders Complete By Expires   Discharge instructions  As directed        Medication List         calcium acetate 667 MG capsule  Commonly known as:  PHOSLO  Take 667-2,001 mg by mouth See admin instructions. Take 2,07m three times a day with meals and take 6671ma day with snacks     hydrALAZINE 100 MG tablet  Commonly known as:  APRESOLINE  Take 100 mg by mouth See admin instructions. Take 100 mg twice a day on dialysis days and take 100 mg four times a day on non dialysis days     levETIRAcetam 1000 MG tablet  Commonly known as:  KEPPRA  Take 1,000 mg by mouth daily.     multivitamin Tabs tablet  Take 1 tablet by mouth at bedtime.     pantoprazole 40 MG tablet  Commonly known as:  PROTONIX  Take 1  tablet (40 mg total) by mouth daily at 6 (six) AM.     zolpidem 5 MG tablet  Commonly known as:  AMBIEN  Take 5 mg by mouth at bedtime.       Allergies  Allergen Reactions  . Reglan [Metoclopramide] Other (See Comments)    [place] up with MATTINGLY,MICHAEL T, MD. Schedule an appointment as soon as possible for a visit in 1 week. ([facility]    Specialty:  Nephrology   Contact information:   [address] [phone]     Schedule an appointment as soon as possible for a visit with JoScarlette ShortsMD. (Capsule endoscopy)    Specialty:  Gastroenterology   Contact information:   [address] [phone]     Schedule an appointment as soon as possible for a visit with CHISM, DAVID, MD. (Followup)    Specialty:  Internal Medicine   Contact information:   [phone]      The results of significant diagnostics  from this hospitalization (including imaging, microbiology, ancillary and laboratory) are listed below for reference.    Significant Diagnostic Studies: No results found.  Microbiology: Recent Results (from the past 240 hour(s))  MRSA PCR SCREENING     Status: None   Collection Time    08/21/13  6:42 PM      Result Value Ref Range Status   MRSA by PCR NEGATIVE  NEGATIVE Final   Comment:            The GeneXpert MRSA Assay (FDA     approved for NASAL specimens     only), is one component of a     comprehensive MRSA colonization     surveillance program. It is not     intended to diagnose MRSA     infection nor to guide or     monitor treatment for     MRSA infections.     Labs: Basic Metabolic Panel:  Recent Labs Lab 08/21/13 1210 08/21/13 1900 08/22/13 0650 08/23/13 1925 08/24/13 0845  NA 145  --  139 133* 138  K 3.4*  --  5.5* 5.6* 4.2  CL 101  --  99 93* 99  CO2 28  --  24 23 27    GLUCOSE 109*  --  83 124* 99  BUN 50*  --  73* 117* 42*  CREATININE 5.37* 6.19* 6.78* 8.58* 4.12*  CALCIUM 8.0*  --  7.5* 8.2* 8.2*  PHOS  --   --   --  4.4  --    Liver Function Tests:  Recent Labs Lab 08/21/13 1210 08/22/13 0650 08/23/13 1925  AST 20 18  --   ALT 10 10  --   ALKPHOS 51 41  --   BILITOT 0.3 0.7  --   PROT 7.2 6.4  --   ALBUMIN 2.9* 2.5* 2.5*   No results found for this basename: LIPASE, AMYLASE,  in the last 168 hours No results found for this basename: AMMONIA,  in the last 168 hours CBC:  Recent Labs Lab 08/21/13 1210 08/21/13 1900 08/22/13 0650 08/23/13 0551 08/23/13 1924 08/24/13 0845  WBC 5.5 6.9 4.9  --  5.2 4.9  HGB 6.1* 6.2* 7.1* 7.1* 6.8* 7.6*  HCT 19.1* 19.8* 21.8* 21.9* 20.9* 23.6*  MCV 82.3 82.5 83.5  --  82.0 82.8  PLT 172 174 153  --  153 176   Cardiac Enzymes: No results found for this basename: CKTOTAL, CKMB, CKMBINDEX, TROPONINI,  in the last 168 hours BNP: BNP (last 3 results) No results found for this basename: PROBNP,  in the last 8760 hours CBG:  Recent Labs Lab 08/22/13 0814 08/23/13 0900 08/24/13 0824  GLUCAP 77 221* 95       Signed:  Rainier Feuerborn  [facility] 08/24/2013, 10:23 AM

## 2013-08-24 NOTE — Progress Notes (Signed)
NUTRITION FOLLOW-UP  DOCUMENTATION CODES Per approved criteria  -Severe malnutrition in the context of chronic illness   INTERVENTION: Resource Breeze po BID, each supplement provides 250 kcal and 9 grams of protein, once diet advances.  NUTRITION DIAGNOSIS: Increased nutrient needs related to HD and chronic medical issues as evidenced by estimated needs; ongoing.   Goal: Intake to meet >90% of estimated nutrition needs.  Monitor:  weight trends, lab trends, I/O's, PO intake, supplement tolerance   ASSESSMENT: PMHx significant for ESRD (HD TTS), HTN, DM2, cocaine abuse, anemia. Admitted from HD center with weakness and malaise. Work-up reveals microcytic anemia.  Per H&P, pt seen by oncology as outpatient and BM bx noted potential B cell proliferative disorder, suggesting potential follicular lymphoma.   Pt meets criteria for severe MALNUTRITION in the context of chronic illness as evidenced by 9% wt loss x 1 month and severe wasting.  Potassium elevated at 5.6  Pt reports that he ate > 50% of his Breakfast today. Pt is willing to try [employer].   Height: Ht Readings from Last 1 Encounters:  08/01/13 5\' 9"  (1.753 m)    Weight: Wt Readings from Last 1 Encounters:  08/23/13 126 lb 8.7 oz (57.4 kg)  Admission weight 128 lb (57.4 kg) 5/5  BMI:  Body mass index is 18.68 kg/(m^2). WNL  Estimated Nutritional Needs: Kcal: 2536 - 2000 Protein: at least 87 g daily Fluid: 1.2 liters  Skin: intact  Diet Order: Renal with 1200 ml Fluid Restriction   Intake/Output Summary (Last 24 hours) at 08/24/13 0934 Last data filed at 08/24/13 0555  Gross per 24 hour  Intake    695 ml  Output   4619 ml  Net  -3924 ml    Last BM: 5/6  Labs:   Recent Labs Lab 08/21/13 1210 08/21/13 1900 08/22/13 0650 08/23/13 1925  NA 145  --  139 133*  K 3.4*  --  5.5* 5.6*  CL 101  --  99 93*  CO2 28  --  24 23  BUN 50*  --  73* 117*  CREATININE 5.37* 6.19* 6.78* 8.58*   CALCIUM 8.0*  --  7.5* 8.2*  PHOS  --   --   --  4.4  GLUCOSE 109*  --  83 124*    CBG (last 3)   Recent Labs  08/22/13 0814 08/23/13 0900 08/24/13 0824  GLUCAP 77 221* 95    Scheduled Meds: . calcium acetate  2,001 mg Oral TID WC  . darbepoetin (ARANESP) injection - DIALYSIS  100 mcg Intravenous Q Thu-HD  . doxercalciferol  3 mcg Intravenous Q T,Th,Sa-HD  . hydrALAZINE  100 mg Oral 2 times per day on Tue Thu Sat  . hydrALAZINE  100 mg Oral 4 times per day on Sun Mon Wed Fri  . levETIRAcetam  1,000 mg Oral Daily  . multivitamin  1 tablet Oral QHS  . pantoprazole  40 mg Oral Q0600  . sodium chloride  3 mL Intravenous Q12H  . zolpidem  5 mg Oral QHS    Continuous Infusions:    Maylon Peppers RD, LDN, CNSC [phone] Pager [phone] After Hours Pager

## 2013-08-25 ENCOUNTER — Inpatient Hospital Stay (HOSPITAL_COMMUNITY): Payer: Medicare Other

## 2013-08-25 LAB — CBC WITH DIFFERENTIAL/PLATELET
Basophils Absolute: 0.1 10*3/uL (ref 0.0–0.1)
Basophils Absolute: 0 10*3/uL (ref 0.0–0.1)
Basophils Relative: 1 % (ref 0–1)
Basophils Relative: 1 % (ref 0–1)
EOS PCT: 3 % (ref 0–5)
Eosinophils Absolute: 0.2 10*3/uL (ref 0.0–0.7)
Eosinophils Absolute: 0.2 10*3/uL (ref 0.0–0.7)
Eosinophils Relative: 4 % (ref 0–5)
HCT: 27.2 % — ABNORMAL LOW (ref 39.0–52.0)
HEMATOCRIT: 24 % — AB (ref 39.0–52.0)
HEMOGLOBIN: 8.8 g/dL — AB (ref 13.0–17.0)
Hemoglobin: 7.7 g/dL — ABNORMAL LOW (ref 13.0–17.0)
LYMPHS ABS: 0.8 10*3/uL (ref 0.7–4.0)
Lymphocytes Relative: 12 % (ref 12–46)
Lymphocytes Relative: 13 % (ref 12–46)
Lymphs Abs: 0.5 10*3/uL — ABNORMAL LOW (ref 0.7–4.0)
MCH: 27.1 pg (ref 26.0–34.0)
MCH: 27.8 pg (ref 26.0–34.0)
MCHC: 32.1 g/dL (ref 30.0–36.0)
MCHC: 32.4 g/dL (ref 30.0–36.0)
MCV: 84.5 fL (ref 78.0–100.0)
MCV: 85.8 fL (ref 78.0–100.0)
MONOS PCT: 14 % — AB (ref 3–12)
MONOS PCT: 9 % (ref 3–12)
Monocytes Absolute: 0.6 10*3/uL (ref 0.1–1.0)
Monocytes Absolute: 0.6 10*3/uL (ref 0.1–1.0)
Neutro Abs: 3.2 10*3/uL (ref 1.7–7.7)
Neutro Abs: 4.9 10*3/uL (ref 1.7–7.7)
Neutrophils Relative %: 69 % (ref 43–77)
Neutrophils Relative %: 74 % (ref 43–77)
Platelets: 183 10*3/uL (ref 150–400)
Platelets: 184 10*3/uL (ref 150–400)
RBC: 2.84 MIL/uL — ABNORMAL LOW (ref 4.22–5.81)
RBC: 3.17 MIL/uL — AB (ref 4.22–5.81)
RDW: 16.6 % — ABNORMAL HIGH (ref 11.5–15.5)
RDW: 16.3 % — ABNORMAL HIGH (ref 11.5–15.5)
WBC: 4.6 10*3/uL (ref 4.0–10.5)
WBC: 6.6 10*3/uL (ref 4.0–10.5)

## 2013-08-25 LAB — TYPE AND SCREEN
ABO/RH(D): A POS
Antibody Screen: NEGATIVE
UNIT DIVISION: 0
UNIT DIVISION: 0
UNIT DIVISION: 0
Unit division: 0
Unit division: 0
Unit division: 0

## 2013-08-25 LAB — LACTATE DEHYDROGENASE: LDH: 209 U/L (ref 94–250)

## 2013-08-25 LAB — GLUCOSE, CAPILLARY: Glucose-Capillary: 136 mg/dL — ABNORMAL HIGH (ref 70–99)

## 2013-08-25 LAB — CBC
HCT: 28.9 % — ABNORMAL LOW (ref 39.0–52.0)
HEMOGLOBIN: 9.6 g/dL — AB (ref 13.0–17.0)
MCH: 27.5 pg (ref 26.0–34.0)
MCHC: 33.2 g/dL (ref 30.0–36.0)
MCV: 82.8 fL (ref 78.0–100.0)
Platelets: 194 10*3/uL (ref 150–400)
RBC: 3.49 MIL/uL — AB (ref 4.22–5.81)
RDW: 16.1 % — ABNORMAL HIGH (ref 11.5–15.5)
WBC: 7.8 10*3/uL (ref 4.0–10.5)

## 2013-08-25 MED ORDER — IOHEXOL 300 MG/ML  SOLN: 25.0000 mL | INTRAMUSCULAR | Status: AC

## 2013-08-25 MED ORDER — DOXERCALCIFEROL 4 MCG/2ML IV SOLN
INTRAVENOUS | Status: AC
  Administered 2013-08-25: 3 ug via INTRAVENOUS
  Filled 2013-08-25: qty 2

## 2013-08-25 NOTE — Progress Notes (Signed)
Results for blood collected for cbc about 00:15am could not be found, called to verify with lab. Results recorded at 06:50am.

## 2013-08-25 NOTE — Progress Notes (Addendum)
[facility]                                                                              Patient Demographics  [name], is a 58 y.o. male, DOB - October 22, 1955, BDZ:[id number]  Admit date - 08/21/2013   Admitting Physician Nita Sells, MD  Outpatient Primary MD for the patient is Windy Kalata, MD  LOS - 4   Chief Complaint  Patient presents with  . Fatigue        Assessment & Plan   Normacytic anemia -Patient was transfused 6 units packed red blood cells, hemoglobin improved from 6.2 to 7.7 -Gastroenterology consulted and appreciated, and recommended outpatient capsule endoscopy. -Spoke with Dr. Hilarie Fredrickson, will schedule capsule endoscopy as an inpatient on Monday, 08/27/2013 due to continued anemia despite transfusions -It appears the patient has been admitted twice in the past 3-4 months for anemia, patient has missed outpatient appointments for capsule endoscopy -Patient has also been seeing Dr. Juliann Mule, oncologist -Patient would not allow for rectal exam, occult still has not been collected -Anemia panel: Iron 103, ferritin 244 -Apparently there was a lab error overnight, patient's hemoglobin after dialysis was 8.8 (this has held up his discharge) -Will obtain a CT abd/pelvis with oral contrast  -Will also order LDH, haptoglobin.  End-stage renal disease requiring hemodialysis -Nephrology consulted and appreciated  Seizure disorder -Continue Keppra  Hypertension -Continue hydralazine  Diabetes mellitus -Last hemoglobin A1c 5 (11/07/13)  Polysubstance/ Cocaine abuse -Counseled -Cocaine positive  ?B-Cell lymphoproliferative disorder -Dr. Juliann Mule consulted and aware of patient -Patient has been lost to followup -Patient has had 2 bone marrow biopsies  Medical nonadherance -Patient has been very difficult and even stated that he would not follow up as an outpatient for his capsule endoscopy -He has been lost to followup with GI as well as  oncology/hematology -Patient has been counseled many times  Code Status: Full  Family Communication: None  Disposition Plan: Admitted, Patient's discharge held to low Hb (reported at 7.7, then retested to be 8.8).  Patient will have capsule endoscopy as well as CT abd in patient.    Time Spent in minutes   25 minutes  Procedures none  Consults   Nephrology Gastroenterology Oncology  DVT Prophylaxis  SCDs  Lab Results  Component Value Date   PLT 184 08/25/2013    Medications  Scheduled Meds: . calcium acetate  2,001 mg Oral TID WC  . darbepoetin (ARANESP) injection - DIALYSIS  100 mcg Intravenous Q Thu-HD  . doxercalciferol  3 mcg Intravenous Q T,Th,Sa-HD  . feeding supplement (RESOURCE BREEZE)  1 Container Oral BID BM  . hydrALAZINE  100 mg Oral 2 times per day on Tue Thu Sat  . hydrALAZINE  100 mg Oral 4 times per day on Sun Mon Wed Fri  . iohexol  25 mL Oral Q1 Hr x 2  . levETIRAcetam  1,000 mg Oral Daily  . multivitamin  1 tablet Oral QHS  . pantoprazole  40 mg Oral Q0600  . sodium chloride  3 mL Intravenous Q12H  . zolpidem  5 mg Oral QHS   Continuous Infusions:  PRN Meds:.acetaminophen, calcium acetate, hydrALAZINE, HYDROcodone-acetaminophen  Antibiotics  Anti-infectives   None      Subjective:   [name] seen and examined today.  Patient wishes to eat. Denies pain, chest pain, shortness of breath at this time.  Objective:   Filed Vitals:   08/25/13 0930 08/25/13 1000 08/25/13 1045 08/25/13 1134  BP: 139/60 149/69 159/71 160/59  Pulse: 70 66 66 72  Temp:    99.3 F (37.4 C)  TempSrc:    Oral  Resp:    16  Weight:   58.1 kg (128 lb 1.4 oz)   SpO2:   100% 100%    Wt Readings from Last 3 Encounters:  08/25/13 58.1 kg (128 lb 1.4 oz)  08/01/13 58.242 kg (128 lb 6.4 oz)  07/25/13 63.504 kg (140 lb)     Intake/Output Summary (Last 24 hours) at 08/25/13 1150 Last data filed at 08/25/13 1045  Gross per 24 hour  Intake    455 ml  Output    3001 ml  Net  -2546 ml    Exam  General: Well developed, well nourished, NAD, appears stated age  HEENT: NCAT,  mucous membranes moist.   Neck: Supple, no JVD, no masses  Cardiovascular: S1 S2 auscultated, 2/6 SEM. Regular rate and rhythm.  Respiratory: Clear to auscultation bilaterally with equal chest rise  Abdomen: Soft, nontender, nondistended, + bowel sounds  Extremities: warm dry without cyanosis clubbing or edema  Neuro: AAOx3, No focal deficits  Skin: Without rashes exudates or nodules  Psych: Normal affect and demeanor   Data Review   Micro Results Recent Results (from the past 240 hour(s))  MRSA PCR SCREENING     Status: None   Collection Time    08/21/13  6:42 PM      Result Value Ref Range Status   MRSA by PCR NEGATIVE  NEGATIVE Final   Comment:            The GeneXpert MRSA Assay (FDA     approved for NASAL specimens     only), is one component of a     comprehensive MRSA colonization     surveillance program. It is not     intended to diagnose MRSA     infection nor to guide or     monitor treatment for     MRSA infections.    Radiology Reports No results found.  CBC  Recent Labs Lab 08/23/13 1924 08/24/13 0030 08/24/13 0845 08/25/13 0600 08/25/13 0807  WBC 5.2 7.8 4.9 4.6 6.6  HGB 6.8* 9.6* 7.6* 7.7* 8.8*  HCT 20.9* 28.9* 23.6* 24.0* 27.2*  PLT 153 194 176 183 184  MCV 82.0 82.8 82.8 84.5 85.8  MCH 26.7 27.5 26.7 27.1 27.8  MCHC 32.5 33.2 32.2 32.1 32.4  RDW 16.7* 16.1* 16.6* 16.6* 16.3*  LYMPHSABS  --   --   --  0.5* 0.8  MONOABS  --   --   --  0.6 0.6  EOSABS  --   --   --  0.2 0.2  BASOSABS  --   --   --  0.1 0.0    Chemistries   Recent Labs Lab 08/21/13 1210 08/21/13 1900 08/22/13 0650 08/23/13 1925 08/24/13 0845  NA 145  --  139 133* 138  K 3.4*  --  5.5* 5.6* 4.2  CL 101  --  99 93* 99  CO2 28  --  24 23 27   GLUCOSE 109*  --  83 124* 99  BUN 50*  --  73* 117*  42*  CREATININE 5.37* 6.19* 6.78* 8.58* 4.12*    CALCIUM 8.0*  --  7.5* 8.2* 8.2*  AST 20  --  18  --   --   ALT 10  --  10  --   --   ALKPHOS 51  --  41  --   --   BILITOT 0.3  --  0.7  --   --    ------------------------------------------------------------------------------------------------------------------ CrCl is unknown because both a height and weight (above a minimum accepted value) are required for this calculation. ------------------------------------------------------------------------------------------------------------------ No results found for this basename: HGBA1C,  in the last 72 hours ------------------------------------------------------------------------------------------------------------------ No results found for this basename: CHOL, HDL, LDLCALC, TRIG, CHOLHDL, LDLDIRECT,  in the last 72 hours ------------------------------------------------------------------------------------------------------------------ No results found for this basename: TSH, T4TOTAL, FREET3, T3FREE, THYROIDAB,  in the last 72 hours ------------------------------------------------------------------------------------------------------------------  Recent Labs  08/22/13 1223  VITAMINB12 455  FOLATE 9.0  FERRITIN 244  TIBC 237  IRON 103  RETICCTPCT 1.6    Coagulation profile  Recent Labs Lab 08/22/13 0650  INR 1.22    No results found for this basename: DDIMER,  in the last 72 hours  Cardiac Enzymes No results found for this basename: CK, CKMB, TROPONINI, MYOGLOBIN,  in the last 168 hours ------------------------------------------------------------------------------------------------------------------ No components found with this basename: POCBNP,     Sander Remedios D.O. on 08/25/2013 at 11:50 AM  Between 7am to 7pm - Pager - [phone]  After 7pm go to [email or web address] - password [email or web address]  And look for the night coverage person covering for me after hours  [facility] Office  [phone]

## 2013-08-25 NOTE — Progress Notes (Signed)
Pt not seen today, but seen earlier this week. Contacted by hospitalist, Dr. Ree Kida. Patient required 2 additional units of packed red cells yesterday without significant improvement in hemoglobin. Still no overt bleeding, melena or hematochezia. Primary team planning workup for hemolysis as a source for blood loss Given ongoing question about anemia, and the fact the patient failed to followup with [place] in the office,  will plan inpatient video capsule endoscopy to be performed Monday (before discharge).

## 2013-08-25 NOTE — Progress Notes (Signed)
Subjective:  No complaints, feeling well  Objective: Vital signs in last 24 hours: Temp:  [97.4 F (36.3 C)-98.6 F (37 C)] 98.6 F (37 C) (05/09 0405) Pulse Rate:  [69-81] 78 (05/09 0405) Resp:  [16-18] 17 (05/09 0405) BP: (154-176)/(61-78) 166/61 mmHg (05/09 0405) SpO2:  [94 %-100 %] 100 % (05/09 0405) Weight change:   Intake/Output from previous day: 05/08 0701 - 05/09 0700 In: 455 [P.O.:120; Blood:335] Out: -    Lab Results:  Recent Labs  08/24/13 0845 08/25/13 0600  WBC 4.9 4.6  HGB 7.6* 7.7*  HCT 23.6* 24.0*  PLT 176 183   BMET:  Recent Labs  08/23/13 1925 08/24/13 0845  NA 133* 138  K 5.6* 4.2  CL 93* 99  CO2 23 27  GLUCOSE 124* 99  BUN 117* 42*  CREATININE 8.58* 4.12*  CALCIUM 8.2* 8.2*  ALBUMIN 2.5*  --    No results found for this basename: PTH,  in the last 72 hours Iron Studies:  Recent Labs  08/22/13 1223  IRON 103  TIBC 237  FERRITIN 244   Studies/Results: No results found.  EXAM:  General appearance: Alert, in no apparent distress  Resp: CTA without rales, rhonchi, or wheezes  Cardio: RRR without murmur or rub  GI: + BS, soft and nontender  Extremities: No edema  Access: AVF @ LUA with + bruit   Dialysis Orders: TTS @ [facility]  57.5kg 2K/2.25Ca 4hr 180 400/1.5 L AVF  hectrol 3 mcg IV/HD Aranesp100 Units IV/weekly (started 5/5) Venofer 100 q HD x10 (stop 5/19)   Assessment/Plan: 1. Anemia - multifactorial with probable GIB, anemia of CKD, & possible bone marrow dysfunction; s/p U PRBCs on 5/5, 1 U on 5/7, Hgb 7.7 prior to 2 U yesterday, on Aranesp 100 mcg on Thurs; capsule endoscopy to be scheduled per GI as outpatient. 2. ESRD - HD on TTS @ [facility]. Next HD today.  3. HTN/Volume - BP 166/61 on Hydralazine; wt 57.4 kg with HD pending.  4. Sec HPT - Ca 8.2 (9.4 corrected), P 4.4; Hectorol 3 mcg, Phoslo with meals.  5. Nutrition - Alb 2.5, renal diet, vitamin.  6. Seizure disorder - on Keppra.  7. B-cell lymphoproliferative disorder -  followed by Dr. Juliann Mule, but frequently misses outpatient appts.    LOS: 4 days   Ramiro Harvest 08/25/2013,7:09 AM  Pt seen, examined, agree w assess/plan as above with additions as indicated. Hb did not rise with 2u prbc's yesterday. Have d/w patient/staff to save all stools for inspection and stool guiac; have d/w primary, reconsulting GI and get abd CT and hemolysis markers to look for other causes of anemia.  Kelly Splinter MD pager [phone]    cell [phone] 08/25/2013, 9:19 AM

## 2013-08-26 LAB — BASIC METABOLIC PANEL
BUN: 47 mg/dL — ABNORMAL HIGH (ref 6–23)
CHLORIDE: 99 meq/L (ref 96–112)
CO2: 27 meq/L (ref 19–32)
CREATININE: 4.99 mg/dL — AB (ref 0.50–1.35)
Calcium: 8.5 mg/dL (ref 8.4–10.5)
GFR calc Af Amer: 14 mL/min — ABNORMAL LOW (ref >90)
GFR calc non Af Amer: 12 mL/min — ABNORMAL LOW (ref >90)
GLUCOSE: 128 mg/dL — AB (ref 70–99)
Potassium: 3.8 mEq/L (ref 3.7–5.3)
Sodium: 140 mEq/L (ref 137–147)

## 2013-08-26 LAB — CBC
HCT: 28.6 % — ABNORMAL LOW (ref 39.0–52.0)
HEMOGLOBIN: 9.1 g/dL — AB (ref 13.0–17.0)
MCH: 27.8 pg (ref 26.0–34.0)
MCHC: 31.8 g/dL (ref 30.0–36.0)
MCV: 87.5 fL (ref 78.0–100.0)
PLATELETS: 185 10*3/uL (ref 150–400)
RBC: 3.27 MIL/uL — AB (ref 4.22–5.81)
RDW: 16.7 % — ABNORMAL HIGH (ref 11.5–15.5)
WBC: 6.7 10*3/uL (ref 4.0–10.5)

## 2013-08-26 LAB — GLUCOSE, CAPILLARY: Glucose-Capillary: 126 mg/dL — ABNORMAL HIGH (ref 70–99)

## 2013-08-26 MED ORDER — POLYETHYLENE GLYCOL 3350 17 GM/SCOOP PO POWD
0.5000 | Freq: Once | ORAL | Status: AC
  Administered 2013-08-26: 0.5 via ORAL
  Filled 2013-08-26: qty 255

## 2013-08-26 NOTE — Progress Notes (Signed)
[facility]                                                                              Patient Demographics  [name], is a 58 y.o. male, DOB - 1955-10-09, ZOX:[id number]  Admit date - 08/21/2013   Admitting Physician Nita Sells, MD  Outpatient Primary MD for the patient is Windy Kalata, MD  LOS - 5   Chief Complaint  Patient presents with  . Fatigue        Assessment & Plan   Normacytic anemia -Patient was transfused 6 units packed red blood cells, hemoglobin improved from 6.2 to 7.7 -Gastroenterology consulted and appreciated, and recommended outpatient capsule endoscopy. -Spoke with Dr. Hilarie Fredrickson, will schedule capsule endoscopy as an inpatient on Monday, 08/27/2013 due to continued anemia despite transfusions -It appears the patient has been admitted twice in the past 3-4 months for anemia, patient has missed outpatient appointments for capsule endoscopy -Patient has also been seeing Dr. Juliann Mule, oncologist -Patient would not allow for rectal exam, occult still has not been collected -Anemia panel: Iron 103, ferritin 244 -Apparently there was a lab error overnight, patient's hemoglobin after dialysis was 8.8 (this has held up his discharge) -CT Abd:No acute findings within the abdomen or pelvis -LDH 209, haptoglobin 127 (both WNL)  End-stage renal disease requiring hemodialysis -Nephrology consulted and appreciated  Seizure disorder -Continue Keppra  Hypertension -Continue hydralazine  Diabetes mellitus -Last hemoglobin A1c 5 (11/07/13)  Polysubstance/ Cocaine abuse -Counseled -Cocaine positive  ?B-Cell lymphoproliferative disorder -Dr. Juliann Mule consulted and aware of patient -Patient has been lost to followup -Patient has had 2 bone marrow biopsies  Medical nonadherance -Patient has been very difficult and even stated that he would not follow up as an outpatient for his capsule endoscopy -He has been lost to followup with GI as well  as oncology/hematology -Patient has been counseled many times  Code Status: Full  Family Communication: None  Disposition Plan: Admitted, Patient's discharge held to low Hb (reported on 5/9 at 7.7, then retested to be 8.8).  Patient will have capsule endoscopy on 5/11, with likely discharge on 5/12.    Time Spent in minutes   25 minutes  Procedures none  Consults   Nephrology Gastroenterology Oncology  DVT Prophylaxis  SCDs  Lab Results  Component Value Date   PLT 185 08/26/2013    Medications  Scheduled Meds: . calcium acetate  2,001 mg Oral TID WC  . darbepoetin (ARANESP) injection - DIALYSIS  100 mcg Intravenous Q Thu-HD  . doxercalciferol  3 mcg Intravenous Q T,Th,Sa-HD  . feeding supplement (RESOURCE BREEZE)  1 Container Oral BID BM  . hydrALAZINE  100 mg Oral 2 times per day on Tue Thu Sat  . hydrALAZINE  100 mg Oral 4 times per day on Sun Mon Wed Fri  . levETIRAcetam  1,000 mg Oral Daily  . multivitamin  1 tablet Oral QHS  . pantoprazole  40 mg Oral Q0600  . sodium chloride  3 mL Intravenous Q12H  . zolpidem  5 mg Oral QHS   Continuous Infusions:  PRN Meds:.acetaminophen, calcium acetate, hydrALAZINE, HYDROcodone-acetaminophen  Antibiotics    Anti-infectives   None  Subjective:   [name] seen and examined today.  Patient wishes to eat and that is his only concern. Denies pain, chest pain, shortness of breath at this time.  Objective:   Filed Vitals:   08/25/13 1134 08/25/13 1711 08/25/13 1954 08/26/13 0436  BP: 160/59 152/61 174/67 153/63  Pulse: 72 72 76 73  Temp: 99.3 F (37.4 C) 99.2 F (37.3 C) 99.3 F (37.4 C) 98.5 F (36.9 C)  TempSrc: Oral Oral    Resp: 16  18 16   Weight:      SpO2: 100% 94% 96% 96%    Wt Readings from Last 3 Encounters:  08/25/13 58.1 kg (128 lb 1.4 oz)  08/01/13 58.242 kg (128 lb 6.4 oz)  07/25/13 63.504 kg (140 lb)     Intake/Output Summary (Last 24 hours) at 08/26/13 0804 Last data filed at  08/26/13 0650  Gross per 24 hour  Intake    720 ml  Output   3001 ml  Net  -2281 ml    Exam  General: Well developed, well nourished, NAD, appears stated age  HEENT: NCAT,  mucous membranes moist.   Neck: Supple, no JVD, no masses  Cardiovascular: S1 S2 auscultated, 2/6 SEM. Regular rate and rhythm.  Respiratory: Clear to auscultation bilaterally with equal chest rise  Abdomen: Soft, nontender, nondistended, + bowel sounds  Extremities: warm dry without cyanosis clubbing or edema  Neuro: AAOx3, No focal deficits  Skin: Without rashes exudates or nodules  Psych: Normal affect and demeanor   Data Review   Micro Results Recent Results (from the past 240 hour(s))  MRSA PCR SCREENING     Status: None   Collection Time    08/21/13  6:42 PM      Result Value Ref Range Status   MRSA by PCR NEGATIVE  NEGATIVE Final   Comment:            The GeneXpert MRSA Assay (FDA     approved for NASAL specimens     only), is one component of a     comprehensive MRSA colonization     surveillance program. It is not     intended to diagnose MRSA     infection nor to guide or     monitor treatment for     MRSA infections.    Radiology Reports No results found.  CBC  Recent Labs Lab 08/24/13 0030 08/24/13 0845 08/25/13 0600 08/25/13 0807 08/26/13 0535  WBC 7.8 4.9 4.6 6.6 6.7  HGB 9.6* 7.6* 7.7* 8.8* 9.1*  HCT 28.9* 23.6* 24.0* 27.2* 28.6*  PLT 194 176 183 184 185  MCV 82.8 82.8 84.5 85.8 87.5  MCH 27.5 26.7 27.1 27.8 27.8  MCHC 33.2 32.2 32.1 32.4 31.8  RDW 16.1* 16.6* 16.6* 16.3* 16.7*  LYMPHSABS  --   --  0.5* 0.8  --   MONOABS  --   --  0.6 0.6  --   EOSABS  --   --  0.2 0.2  --   BASOSABS  --   --  0.1 0.0  --     Chemistries   Recent Labs Lab 08/21/13 1210 08/21/13 1900 08/22/13 0650 08/23/13 1925 08/24/13 0845 08/26/13 0535  NA 145  --  139 133* 138 140  K 3.4*  --  5.5* 5.6* 4.2 3.8  CL 101  --  99 93* 99 99  CO2 28  --  24 23 27 27   GLUCOSE  109*  --  83 124* 99  128*  BUN 50*  --  73* 117* 42* 47*  CREATININE 5.37* 6.19* 6.78* 8.58* 4.12* 4.99*  CALCIUM 8.0*  --  7.5* 8.2* 8.2* 8.5  AST 20  --  18  --   --   --   ALT 10  --  10  --   --   --   ALKPHOS 51  --  41  --   --   --   BILITOT 0.3  --  0.7  --   --   --    ------------------------------------------------------------------------------------------------------------------ CrCl is unknown because both a height and weight (above a minimum accepted value) are required for this calculation. ------------------------------------------------------------------------------------------------------------------ No results found for this basename: HGBA1C,  in the last 72 hours ------------------------------------------------------------------------------------------------------------------ No results found for this basename: CHOL, HDL, LDLCALC, TRIG, CHOLHDL, LDLDIRECT,  in the last 72 hours ------------------------------------------------------------------------------------------------------------------ No results found for this basename: TSH, T4TOTAL, FREET3, T3FREE, THYROIDAB,  in the last 72 hours ------------------------------------------------------------------------------------------------------------------ No results found for this basename: VITAMINB12, FOLATE, FERRITIN, TIBC, IRON, RETICCTPCT,  in the last 72 hours  Coagulation profile  Recent Labs Lab 08/22/13 0650  INR 1.22    No results found for this basename: DDIMER,  in the last 72 hours  Cardiac Enzymes No results found for this basename: CK, CKMB, TROPONINI, MYOGLOBIN,  in the last 168 hours ------------------------------------------------------------------------------------------------------------------ No components found with this basename: POCBNP,     Luba Matzen D.O. on 08/26/2013 at 8:04 AM  Between 7am to 7pm - Pager - [phone]  After 7pm go to [email or web address] - password [email or web address]  And look for  the night coverage person covering for me after hours  [facility] Office  [phone]

## 2013-08-26 NOTE — Progress Notes (Signed)
Subjective:  No complaints, wants to smoke a cigarette, staying through capsule endoscopy tomorrow  Objective: Vital signs in last 24 hours: Temp:  [98.5 F (36.9 C)-99.3 F (37.4 C)] 98.5 F (36.9 C) (05/10 0436) Pulse Rate:  [66-76] 73 (05/10 0436) Resp:  [16-18] 16 (05/10 0436) BP: (138-174)/(59-71) 153/63 mmHg (05/10 0436) SpO2:  [94 %-100 %] 96 % (05/10 0436) Weight:  [58.1 kg (128 lb 1.4 oz)] 58.1 kg (128 lb 1.4 oz) (05/09 1045) Weight change:   Intake/Output from previous day: 05/09 0701 - 05/10 0700 In: 720 [P.O.:720] Out: 3001    Lab Results:  Recent Labs  08/25/13 0807 08/26/13 0535  WBC 6.6 6.7  HGB 8.8* 9.1*  HCT 27.2* 28.6*  PLT 184 185   BMET:  Recent Labs  08/23/13 1925 08/24/13 0845 08/26/13 0535  NA 133* 138 140  K 5.6* 4.2 3.8  CL 93* 99 99  CO2 23 27 27   GLUCOSE 124* 99 128*  BUN 117* 42* 47*  CREATININE 8.58* 4.12* 4.99*  CALCIUM 8.2* 8.2* 8.5  ALBUMIN 2.5*  --   --    No results found for this basename: PTH,  in the last 72 hours Iron Studies: No results found for this basename: IRON, TIBC, TRANSFERRIN, FERRITIN,  in the last 72 hours  Studies/Results: Ct Abdomen Pelvis Wo Contrast  08/25/2013   CLINICAL DATA:  Diffuse abdominal pain.  Anemia.  EXAM: CT ABDOMEN AND PELVIS WITHOUT CONTRAST  TECHNIQUE: Multidetector CT imaging of the abdomen and pelvis was performed following the standard protocol without IV contrast.  COMPARISON:  None.  FINDINGS: Surgical clips seen from prior cholecystectomy. Noncontrast images of the liver, spleen, pancreas, adrenal glands, and kidneys are unremarkable in appearance. No evidence of hydronephrosis.  No soft tissue masses or lymphadenopathy identified within the abdomen or pelvis. Mildly enlarged prostate gland noted. No evidence of inflammatory process or abnormal fluid collections. No evidence of dilated bowel loops or hernia. Bilateral L5 pars defects seen with degenerative disc disease and grade 1  anterolisthesis at L5-S1.  In addition, there is destruction of the vertebral endplates at V42-V9 and adjacent vertebral body sclerosis. Infectious discitis cannot be excluded.  IMPRESSION: No acute findings within the abdomen or pelvis.  Mildly enlarged prostate.  Findings suspicious for infectious discitis at T12-L1. Recommend clinical correlation, and lumbar spine MRI without and with contrast for further evaluation.   Electronically Signed   By: Earle Gell M.D.   On: 08/25/2013 13:37   EXAM:  General appearance: Alert, in no apparent distress  Resp: CTA without rales, rhonchi, or wheezes  Cardio: RRR without murmur or rub  GI: + BS, soft and nontender  Extremities: No edema  Access: AVF @ LUA with + bruit   Dialysis Orders: TTS @ [facility]  57.5kg 2K/2.25Ca 4hr 180 400/1.5 L AVF  hectrol 3 mcg IV/HD Aranesp100 Units IV/weekly (started 5/5) Venofer 100 q HD x10 (stop 5/19)  Assessment/Plan: 1. Anemia - multifactorial with probable GIB, anemia of CKD, & possible bone marrow dysfunction; s/p 6 U PRBCs 5/5-5/9, on Aranesp 100 mcg on Thurs; capsule endoscopy tomorrow while inpatient.  2. ESRD - HD on TTS @ [facility], K 3.8. Next HD 5/12.  3. HTN/Volume - BP 153/63 on Hydralazine; wt 58.1 kg s/p net UF 3 L yesterday.  4. 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  5. Nutrition - Alb 2.5, renal diet, vitamin.  6. Seizure disorder - on Keppra.  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      LOS: 5 days   Ramiro Harvest 08/26/2013,8:43 AM  Pt seen, examined and agree w A/P as above.  Kelly Splinter MD pager [phone]    cell [phone] 08/26/2013, 3:13 PM

## 2013-08-26 NOTE — Progress Notes (Signed)
Plan for inpatient video capsule endoscopy to evaluate for possible occult GI bleeding Half of the MiraLax prep tonight, clear liquids the rest of today N.p.o. after midnight

## 2013-08-27 ENCOUNTER — Encounter (HOSPITAL_COMMUNITY)
Admission: EM | Disposition: A | Discharge status: Home / Self Care | Payer: Self-pay | Source: Home / Self Care | Attending: Internal Medicine

## 2013-08-27 HISTORY — PX: GIVENS CAPSULE STUDY: SHX5432

## 2013-08-27 LAB — CBC
HEMATOCRIT: 28.9 % — AB (ref 39.0–52.0)
HEMOGLOBIN: 9.2 g/dL — AB (ref 13.0–17.0)
MCH: 27.8 pg (ref 26.0–34.0)
MCHC: 31.8 g/dL (ref 30.0–36.0)
MCV: 87.3 fL (ref 78.0–100.0)
Platelets: 218 10*3/uL (ref 150–400)
RBC: 3.31 MIL/uL — ABNORMAL LOW (ref 4.22–5.81)
RDW: 17.1 % — ABNORMAL HIGH (ref 11.5–15.5)
WBC: 6.7 10*3/uL (ref 4.0–10.5)

## 2013-08-27 LAB — BASIC METABOLIC PANEL
BUN: 67 mg/dL — AB (ref 6–23)
CHLORIDE: 96 meq/L (ref 96–112)
CO2: 23 mEq/L (ref 19–32)
Calcium: 9.1 mg/dL (ref 8.4–10.5)
Creatinine, Ser: 6.73 mg/dL — ABNORMAL HIGH (ref 0.50–1.35)
GFR calc Af Amer: 10 mL/min — ABNORMAL LOW (ref >90)
GFR calc non Af Amer: 8 mL/min — ABNORMAL LOW (ref >90)
GLUCOSE: 85 mg/dL (ref 70–99)
Potassium: 5.1 mEq/L (ref 3.7–5.3)
Sodium: 135 mEq/L — ABNORMAL LOW (ref 137–147)

## 2013-08-27 LAB — HAPTOGLOBIN: Haptoglobin: 37 mg/dL — ABNORMAL LOW (ref 45–215)

## 2013-08-27 LAB — GLUCOSE, CAPILLARY: Glucose-Capillary: 78 mg/dL (ref 70–99)

## 2013-08-27 SURGERY — IMAGING PROCEDURE, GI TRACT, INTRALUMINAL, VIA CAPSULE

## 2013-08-27 MED ORDER — BOOST / RESOURCE BREEZE PO LIQD: 1.0000 | Freq: Two times a day (BID) | ORAL | Status: DC

## 2013-08-27 SURGICAL SUPPLY — 1 items: TOWEL COTTON PACK 4EA (MISCELLANEOUS) ×6 IMPLANT

## 2013-08-27 NOTE — Progress Notes (Signed)
[name] discharged Home per MD order.  Capsule Study box removed from patient. Removed IV site.Marland Kitchen Discharge instructions reviewed and discussed with the patient, all questions and concerns answered. Copy of instructions and scripts given to patient.  Patient escorted to taxi by NT in a wheelchair,  no distress noted upon discharge.  Cathleen Fears Marvion Bastidas 08/27/2013 8:11 PM

## 2013-08-27 NOTE — Progress Notes (Signed)
Patient swallowed [name] capsule at 0815. Capsule ID: [id number]/Lot #: [id number]. Instructions explained to patient and patient's nurse. Patient and nurse verbalized understanding of instructions.

## 2013-08-27 NOTE — Discharge Summary (Signed)
Physician Discharge Summary  [name] XBM:[id number] DOB: [birth year] DOA: 08/21/2013  PCP: Windy Kalata, MD  Admit date: 08/21/2013 Discharge date: 08/27/2013  Time spent: 45 minutes   Recommendations for Outpatient Follow-up:  Patient will be discharged to home. [name] to followup with his primary care physician within one week of discharge.  Patient should continue his dialysis as scheduled. He should continue his medications as prescribed. Patient should also follow up with Dr. Juliann Mule. He should also follow with Dr. Hilarie Fredrickson, gastroenterologist.  Patient was counseled on the need for compliance with his appointments.  Discharge Diagnoses:  Normacytic anemia  End-stage renal disease requiring hemodialysis  Seizure disorder  Hypertension  Diabetes mellitus  Polysubstance plus cocaine abuse  ? B-cell lymphoproliferative disorder  Discharge Condition: Stable  Diet recommendation: Renal diet with 200 mL fluid restriction per day  Filed Weights   08/25/13 0747 08/25/13 1045 08/26/13 2112  Weight: 61.1 kg (134 lb 11.2 oz) 58.1 kg (128 lb 1.4 oz) 60.3 kg (132 lb 15 oz)    History of present illness:  [name] is a 58 y.o. male known h/o ESRD t/th/sat [[facility]], htn, dm ty2, Prior Cocaine abuse, Anemia Renal disease with blood loss component-supposed to get Capsule by Dr. 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 He denied any overt nausea vomiting abdominal pain but had 1-2 episodes of diarrhea. He stated that he has chronic diarrhea without any specific etiology that he knows of. He stated that this happens occasionally when he eats certain types of foods. He has had no falls or dizziness he had no dark or tarry stool he has no specific abdominal pain he has no fever no chills or shortness of breath although a  low-grade fever is noted in the emergency room. He had no chest pain yesterday sputum he denies any unilateral weakness. Review of records reveals that he was seen by Dr. Henrene Pastor 05/28/13 and his workup was done and it was encouraged that he followup with oncology which he did .  Oncology notes as well as bone marrow biopsies did not potential B cell proliferative disorder as of 08/01/13 note . This suggested potential follicular lymphoma but this was inconclusive a repeat bone marrow biopsy was scheduled but I don't think it was done as yet. Upon admission he is a poor historian and is sleepy at bedside in saying that he is weak and therefore is a little less cooperative with exam. He states that he has not used cocaine he lives with friends in [place] and doesn't drive.   Hospital Course:  Normacytic anemia  -Patient was transfused 6 units packed red blood cells, hemoglobin improved from 6.2 to 9.2 -Gastroenterology consulted and appreciated, and recommended outpatient capsule endoscopy.  -Spoke with Dr. Hilarie Fredrickson, will schedule capsule endoscopy as an inpatient  08/27/2013 due to continued anemia despite transfusions  -It appears the patient has been admitted twice in the past 3-4 months for anemia, patient has missed outpatient appointments for capsule endoscopy  -Patient has also been seeing Dr. Juliann Mule, oncologist  -Patient would not allow for rectal exam, occult still has not been collected  -Anemia panel: Iron 103, ferritin 244  -On 08/25/13: Apparently there was a lab error overnight, patient's hemoglobin after dialysis was 8.8 (this has held up his discharge)  -CT Abd:No acute findings within the abdomen  or pelvis  -LDH 209, haptoglobin 127 (both WNL)  -Patient will need to follow up with Dr. Hilarie Fredrickson for results of his capsule endoscopy.  End-stage renal disease requiring hemodialysis  -Nephrology consulted and appreciated   Seizure disorder  -Continue Keppra   Hypertension  -Continue  hydralazine   Diabetes mellitus  -Last hemoglobin A1c 5 (11/07/13)   Polysubstance/ Cocaine abuse  -Counseled  -Cocaine positive   ?B-Cell lymphoproliferative disorder  -Dr. Juliann Mule consulted and aware of patient  -Patient has been lost to followup  -Patient has had 2 bone marrow biopsies   Medical nonadherance  -Patient has been very difficult and even stated that he would not follow up as an outpatient for his capsule endoscopy  -He has been lost to followup with GI as well as oncology/hematology  -Patient has "threatened to leave many times AMA." -Patient has been counseled many times   Procedures: Capsule Endoscopy  Consultations: Nephrology  Gastroenterology  Oncology   Discharge Exam: Filed Vitals:   08/27/13 0828  BP: 180/66  Pulse: 70  Temp: 98 F (36.7 C)  Resp: 17   Exam  General: Well developed, well nourished, NAD, appears stated age  HEENT: NCAT, mucous membranes moist.  Neck: Supple, no JVD, no masses  Cardiovascular: S1 S2 auscultated, 2/6 SEM. Regular rate and rhythm.  Respiratory: Clear to auscultation bilaterally with equal chest rise  Abdomen: Soft, nontender, nondistended, + bowel sounds  Extremities: warm dry without cyanosis clubbing or edema  Neuro: AAOx3, No focal deficits  Skin: Without rashes exudates or nodules  Psych: Normal affect and demeanor with intact judgement and insight  Discharge Instructions      Discharge Orders   Future Appointments Provider Department Dept Phone   08/31/2013 10:00 AM [facility] Only [facility] [phone]   08/31/2013 10:30 AM [facility] Provider [facility] [phone]   09/06/2013 2:00 PM [facility] [phone]   Future Orders Complete By Expires   Discharge instructions  As directed        Medication List         calcium acetate 667 MG capsule  Commonly known as:   PHOSLO  Take 667-2,001 mg by mouth See admin instructions. Take 2,001mg  three times a day with meals and take 667mg  a day with snacks     feeding supplement (RESOURCE BREEZE) Liqd  Take 1 Container by mouth 2 (two) times daily between meals.     hydrALAZINE 100 MG tablet  Commonly known as:  APRESOLINE  Take 100 mg by mouth See admin instructions. Take 100 mg twice a day on dialysis days and take 100 mg four times a day on non dialysis days     levETIRAcetam 1000 MG tablet  Commonly known as:  KEPPRA  Take 1,000 mg by mouth daily.     multivitamin Tabs tablet  Take 1 tablet by mouth at bedtime.     pantoprazole 40 MG tablet  Commonly known as:  PROTONIX  Take 1 tablet (40 mg total) by mouth daily at 6 (six) AM.     zolpidem 5 MG tablet  Commonly known as:  AMBIEN  Take 5 mg by mouth at bedtime.       Allergies  Allergen Reactions  . Reglan [Metoclopramide] Other (See Comments)    Tardive dyskinesia in 11/2011 in [place]   Follow-up Information   Follow up with MATTINGLY,MICHAEL T, MD. Schedule an appointment as soon as  possible for a visit in 1 week. [facility] followup)    Specialty:  Nephrology   Contact information:   [place] [address] [phone]       Follow up with Jerene Bears, MD. Schedule an appointment as soon as possible for a visit in 1 week. (Follow up on Capsule endoscopy)    Specialty:  Gastroenterology   Contact information:   [address] [place] [address] [phone]       Schedule an appointment as soon as possible for a visit with CHISM, DAVID, MD. (Followup)    Specialty:  Internal Medicine   Contact information:   [place] [address] [phone]        The results of significant diagnostics from this hospitalization (including imaging, microbiology, ancillary and laboratory) are listed below for reference.    Significant Diagnostic Studies: Ct Abdomen Pelvis Wo Contrast  08/25/2013    CLINICAL DATA:  Diffuse abdominal pain.  Anemia.  EXAM: CT ABDOMEN AND PELVIS WITHOUT CONTRAST  TECHNIQUE: Multidetector CT imaging of the abdomen and pelvis was performed following the standard protocol without IV contrast.  COMPARISON:  None.  FINDINGS: Surgical clips seen from prior cholecystectomy. Noncontrast images of the liver, spleen, pancreas, adrenal glands, and kidneys are unremarkable in appearance. No evidence of hydronephrosis.  No soft tissue masses or lymphadenopathy identified within the abdomen or pelvis. Mildly enlarged prostate gland noted. No evidence of inflammatory process or abnormal fluid collections. No evidence of dilated bowel loops or hernia. Bilateral L5 pars defects seen with degenerative disc disease and grade 1 anterolisthesis at L5-S1.  In addition, there is destruction of the vertebral endplates at H23-J9 and adjacent vertebral body sclerosis. Infectious discitis cannot be excluded.  IMPRESSION: No acute findings within the abdomen or pelvis.  Mildly enlarged prostate.  Findings suspicious for infectious discitis at T12-L1. Recommend clinical correlation, and lumbar spine MRI without and with contrast for further evaluation.   Electronically Signed   By: Earle Gell M.D.   On: 08/25/2013 13:37    Microbiology: Recent Results (from the past 240 hour(s))  MRSA PCR SCREENING     Status: None   Collection Time    08/21/13  6:42 PM      Result Value Ref Range Status   MRSA by PCR NEGATIVE  NEGATIVE Final   Comment:            The GeneXpert MRSA Assay (FDA     approved for NASAL specimens     only), is one component of a     comprehensive MRSA colonization     surveillance program. It is not     intended to diagnose MRSA     infection nor to guide or     monitor treatment for     MRSA infections.     Labs: Basic Metabolic Panel:  Recent Labs Lab 08/22/13 0650 08/23/13 1925 08/24/13 0845 08/26/13 0535 08/27/13 0550  NA 139 133* 138 140 135*  K 5.5* 5.6*  4.2 3.8 5.1  CL 99 93* 99 99 96  CO2 _0 GLUCOSE 83 124* 99 128* 85  BUN 73* 117* 42* 47* 67*  CREATININE 6.78* 8.58* 4.12* 4.99* 6.73*  CALCIUM 7.5* 8.2* 8.2* 8.5 9.1  PHOS  --  4.4  --   --   --    Liver Function Tests:  Recent Labs Lab 08/21/13 1210 08/22/13 0650 08/23/13 1925  AST 20 18  --  ALT 10 10  --   ALKPHOS 51 41  --   BILITOT 0.3 0.7  --   PROT 7.2 6.4  --   ALBUMIN 2.9* 2.5* 2.5*   No results found for this basename: LIPASE, AMYLASE,  in the last 168 hours No results found for this basename: AMMONIA,  in the last 168 hours CBC:  Recent Labs Lab 08/24/13 0845 08/25/13 0600 08/25/13 0807 08/26/13 0535 08/27/13 0550  WBC 4.9 4.6 6.6 6.7 6.7  NEUTROABS  --  3.2 4.9  --   --   HGB 7.6* 7.7* 8.8* 9.1* 9.2*  HCT 23.6* 24.0* 27.2* 28.6* 28.9*  MCV 82.8 84.5 85.8 87.5 87.3  PLT 176 183 184 185 218   Cardiac Enzymes: No results found for this basename: CKTOTAL, CKMB, CKMBINDEX, TROPONINI,  in the last 168 hours BNP: BNP (last 3 results) No results found for this basename: PROBNP,  in the last 8760 hours CBG:  Recent Labs Lab 08/23/13 0900 08/24/13 0824 08/25/13 1141 08/26/13 0859 08/27/13 0827  GLUCAP 221* 95 136* 126* 78       Signed:  Davianna Deutschman  [facility] 08/27/2013, 10:18 AM

## 2013-08-27 NOTE — Progress Notes (Signed)
Subjective:   No complaints. Had capsule endoscopy this morning. Wants to go home, needs to stay through tomorrow.  Objective Filed Vitals:   08/26/13 1840 08/26/13 2112 08/27/13 0620 08/27/13 0828  BP: 163/63 174/67 179/75 180/66  Pulse: 70 69 70 70  Temp: 98.3 F (36.8 C) 98.2 F (36.8 C) 97.6 F (36.4 C) 98 F (36.7 C)  TempSrc: Oral Oral Oral Oral  Resp: 18 18 18 17   Weight:  60.3 kg (132 lb 15 oz)    SpO2: 99% 96% 95% 97%   Physical Exam General: Alert and oriented. No acute distress. Heart:  RRR Lungs: CTA, unlabored Abdomen: soft, nontender, +BS Extremities: no edema Dialysis Access: LUA AFV +bruit  Dialysis Orders: TTS @ AF  57.5kg 2K/2.25Ca 4hr 180 400/1.5 L AVF  hectrol 3 mcg IV/HD Aranesp100 Units IV/weekly (started 5/5) Venofer 100 q HD x10 (stop 5/19)  Assessment/Plan:  1. Anemia - multifactorial with probable GIB, anemia of CKD, & possible bone marrow dysfunction; s/p 6 U PRBCs 5/5-5/9, on Aranesp 100 mcg on Thurs; capsule endoscopy 5/11 2. ESRD - HD on TTS @ [facility], K 5.1. Next HD 5/12.  3. HTN/Volume - BP 180/66 on Hydralazine; net UF 3L saturday 4. Sec HPT - Ca 9.1(10.3 corrected), P 4.4; Hectorol 3 mcg, Phoslo with meals.  5. Nutrition - Alb 2.5, renal diet, vitamin.  6. Seizure disorder - on Keppra.  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  8. Cocaine +    Shelle Iron, NP [place] [phone] 08/27/2013,9:28 AM  LOS: 6 days    Additional Objective Labs: Basic Metabolic Panel:  Recent Labs Lab 08/23/13 1925 08/24/13 0845 08/26/13 0535 08/27/13 0550  NA 133* 138 140 135*  K 5.6* 4.2 3.8 5.1  CL 93* 99 99 96  CO2 23 27 27 23   GLUCOSE 124* 99 128* 85  BUN 117* 42* 47* 67*  CREATININE 8.58* 4.12* 4.99* 6.73*  CALCIUM 8.2* 8.2* 8.5 9.1  PHOS 4.4  --   --   --    Liver Function Tests:  Recent Labs Lab 08/21/13 1210 08/22/13 0650  08/23/13 1925  AST 20 18  --   ALT 10 10  --   ALKPHOS 51 41  --   BILITOT 0.3 0.7  --   PROT 7.2 6.4  --   ALBUMIN 2.9* 2.5* 2.5*   No results found for this basename: LIPASE, AMYLASE,  in the last 168 hours CBC:  Recent Labs Lab 08/24/13 0845 08/25/13 0600 08/25/13 0807 08/26/13 0535 08/27/13 0550  WBC 4.9 4.6 6.6 6.7 6.7  NEUTROABS  --  3.2 4.9  --   --   HGB 7.6* 7.7* 8.8* 9.1* 9.2*  HCT 23.6* 24.0* 27.2* 28.6* 28.9*  MCV 82.8 84.5 85.8 87.5 87.3  PLT 176 183 184 185 218   Blood Culture    Component Value Date/Time   SDES URINE, RANDOM 11/07/2012 1903   SPECREQUEST NONE 11/07/2012 1903   CULT NO GROWTH 11/07/2012 1903   REPTSTATUS 11/08/2012 FINAL 11/07/2012 1903    Cardiac Enzymes: No results found for this basename: CKTOTAL, CKMB, CKMBINDEX, TROPONINI,  in the last 168 hours CBG:  Recent Labs Lab 08/23/13 0900 08/24/13 0824 08/25/13 1141 08/26/13 0859 08/27/13 0827  GLUCAP 221* 95 136* 126* 78   Iron Studies: No results found for this basename: IRON, TIBC, TRANSFERRIN, FERRITIN,  in the last 72 hours @lablastinr3 @ Studies/Results: Ct Abdomen Pelvis Wo  Contrast  08/25/2013   CLINICAL DATA:  Diffuse abdominal pain.  Anemia.  EXAM: CT ABDOMEN AND PELVIS WITHOUT CONTRAST  TECHNIQUE: Multidetector CT imaging of the abdomen and pelvis was performed following the standard protocol without IV contrast.  COMPARISON:  None.  FINDINGS: Surgical clips seen from prior cholecystectomy. Noncontrast images of the liver, spleen, pancreas, adrenal glands, and kidneys are unremarkable in appearance. No evidence of hydronephrosis.  No soft tissue masses or lymphadenopathy identified within the abdomen or pelvis. Mildly enlarged prostate gland noted. No evidence of inflammatory process or abnormal fluid collections. No evidence of dilated bowel loops or hernia. Bilateral L5 pars defects seen with degenerative disc disease and grade 1 anterolisthesis at L5-S1.  In addition, there is  destruction of the vertebral endplates at N56-O1 and adjacent vertebral body sclerosis. Infectious discitis cannot be excluded.  IMPRESSION: No acute findings within the abdomen or pelvis.  Mildly enlarged prostate.  Findings suspicious for infectious discitis at T12-L1. Recommend clinical correlation, and lumbar spine MRI without and with contrast for further evaluation.   Electronically Signed   By: Earle Gell M.D.   On: 08/25/2013 13:37   Medications:   . calcium acetate  2,001 mg Oral TID WC  . darbepoetin (ARANESP) injection - DIALYSIS  100 mcg Intravenous Q Thu-HD  . doxercalciferol  3 mcg Intravenous Q T,Th,Sa-HD  . feeding supplement (RESOURCE BREEZE)  1 Container Oral BID BM  . hydrALAZINE  100 mg Oral 2 times per day on Tue Thu Sat  . hydrALAZINE  100 mg Oral 4 times per day on Sun Mon Wed Fri  . levETIRAcetam  1,000 mg Oral Daily  . multivitamin  1 tablet Oral QHS  . pantoprazole  40 mg Oral Q0600  . sodium chloride  3 mL Intravenous Q12H  . zolpidem  5 mg Oral QHS

## 2013-08-28 ENCOUNTER — Other Ambulatory Visit: Payer: Self-pay | Admitting: Internal Medicine

## 2013-08-28 ENCOUNTER — Telehealth: Payer: Self-pay | Admitting: Internal Medicine

## 2013-08-28 DIAGNOSIS — D649 Anemia, unspecified: Secondary | ICD-10-CM

## 2013-08-28 DIAGNOSIS — K922 Gastrointestinal hemorrhage, unspecified: Secondary | ICD-10-CM

## 2013-08-28 NOTE — Telephone Encounter (Signed)
, °

## 2013-08-29 ENCOUNTER — Encounter (HOSPITAL_COMMUNITY): Payer: Self-pay | Admitting: Gastroenterology

## 2013-08-31 ENCOUNTER — Telehealth: Payer: Self-pay | Admitting: Internal Medicine

## 2013-08-31 ENCOUNTER — Ambulatory Visit (HOSPITAL_BASED_OUTPATIENT_CLINIC_OR_DEPARTMENT_OTHER): Payer: Medicare Other | Admitting: Internal Medicine

## 2013-08-31 ENCOUNTER — Other Ambulatory Visit (HOSPITAL_BASED_OUTPATIENT_CLINIC_OR_DEPARTMENT_OTHER): Payer: Medicare Other

## 2013-08-31 VITALS — BP 135/54 | HR 73 | Temp 98.4°F | Resp 18 | Ht 69.0 in | Wt 129.5 lb

## 2013-08-31 DIAGNOSIS — F17200 Nicotine dependence, unspecified, uncomplicated: Secondary | ICD-10-CM

## 2013-08-31 DIAGNOSIS — D649 Anemia, unspecified: Secondary | ICD-10-CM

## 2013-08-31 DIAGNOSIS — Z992 Dependence on renal dialysis: Secondary | ICD-10-CM

## 2013-08-31 DIAGNOSIS — N186 End stage renal disease: Secondary | ICD-10-CM

## 2013-08-31 DIAGNOSIS — F1410 Cocaine abuse, uncomplicated: Secondary | ICD-10-CM

## 2013-08-31 LAB — BASIC METABOLIC PANEL (CC13)
ANION GAP: 12 meq/L — AB (ref 3–11)
BUN: 31.6 mg/dL — ABNORMAL HIGH (ref 7.0–26.0)
CO2: 30 mEq/L — ABNORMAL HIGH (ref 22–29)
CREATININE: 5.7 mg/dL — AB (ref 0.7–1.3)
Calcium: 9.1 mg/dL (ref 8.4–10.4)
Chloride: 102 mEq/L (ref 98–109)
Glucose: 117 mg/dl (ref 70–140)
Potassium: 4.2 mEq/L (ref 3.5–5.1)
Sodium: 144 mEq/L (ref 136–145)

## 2013-08-31 LAB — CBC WITH DIFFERENTIAL/PLATELET
BASO%: 1.6 % (ref 0.0–2.0)
Basophils Absolute: 0.1 10*3/uL (ref 0.0–0.1)
EOS%: 2.5 % (ref 0.0–7.0)
Eosinophils Absolute: 0.1 10*3/uL (ref 0.0–0.5)
HCT: 34 % — ABNORMAL LOW (ref 38.4–49.9)
HGB: 10.8 g/dL — ABNORMAL LOW (ref 13.0–17.1)
LYMPH#: 0.7 10*3/uL — AB (ref 0.9–3.3)
LYMPH%: 13.3 % — AB (ref 14.0–49.0)
MCH: 28 pg (ref 27.2–33.4)
MCHC: 31.9 g/dL — AB (ref 32.0–36.0)
MCV: 87.8 fL (ref 79.3–98.0)
MONO#: 0.7 10*3/uL (ref 0.1–0.9)
MONO%: 11.8 % (ref 0.0–14.0)
NEUT#: 4 10*3/uL (ref 1.5–6.5)
NEUT%: 70.8 % (ref 39.0–75.0)
Platelets: 299 10*3/uL (ref 140–400)
RBC: 3.87 10*6/uL — AB (ref 4.20–5.82)
RDW: 17.5 % — AB (ref 11.0–14.6)
WBC: 5.6 10*3/uL (ref 4.0–10.3)

## 2013-08-31 LAB — LACTATE DEHYDROGENASE (CC13): LDH: 198 U/L (ref 125–245)

## 2013-08-31 NOTE — Progress Notes (Signed)
[facility] OFFICE PROGRESS NOTE  MATTINGLY,MICHAEL T, MD [place] [address]  DIAGNOSIS: Anemia - Plan: CBC with Differential, CBC with Differential, Comprehensive metabolic panel (Cmet) - [facility], Lactate dehydrogenase (LDH) - [facility], SPEP & IFE with QIG, Kappa/lambda light chains  Chief Complaint  Patient presents with  . Follow-up    CURRENT THERAPY: Observation.   INTERVAL HISTORY: [name] 58 y.o. male with a history of polysubstance abuse including crack cocaine and tobacco, ESRDz on hemodialysis (Tues,Thursday, Saturday) and hypertension, anemia of chronic disease, and diabetes mellitus is here for follow-up.  He was recently seen by me in the hospital on 08/23/2013 for recurrent anemia.  GI workup including capsule endoscopy was obtained the day prior to discharge.  The results are pending.  He also had a CT of abdomen which was negative.  He reports dark stools about one week prior to this.  He endorses current crack cocaine as recent as two weeks ago.  Prior to that he also also seen by me in the office on 08/03/2013.  He was lost to laboratory follow up.  Hemolytic work up for his anemia was negative. On prior admission to hospital, his GI workup was negative at that time but FOBT was positive and he has been scheduled for capsule endoscopy with Dr. Henrene Pastor on 09/06/2013.  He had a second bone marrow biopsy as indicated below.  It revealed some atypical lymphoid aggregates but his cytogenetics were within normal limits.  Previously, he was lost-to-follow up in the clinic.    MEDICAL HISTORY: Past Medical History  Diagnosis Date  . Hypertension   . Anemia, chronic disease   . CHF (congestive heart failure)     diastolic.  EF 60 - 65% per [facility] eco 11/2011  . Cocaine abuse     mentioned in notes from [place]  . Hepatitis C antibody test positive     was HIV negative, 02/28/12  . Hepatitis B core antibody positive     03/01/10  . Positive  QuantiFERON-TB Gold test     11/2011  . Helicobacter pylori gastritis     not defined if this was treated  . Polyp of colon, adenomatous     May 2012.  Dr Trenton Founds in [place]  . Shortness of breath   . Hematochezia   . Head injury, closed, with concussion   . History of blood transfusion     "last one was 2 days ago" (11/09/2012)  . Type II diabetes mellitus     on oral pills only  . Arthritis     "right shoulder" (11/09/2012)  . Seizure disorder     questionable history of - will need to clarify with PCP  . Headache(784.0)     "q other day" (11/09/2012)  . ESRD on hemodialysis since 2012    Patient says he started HD in 2012.  ESRD was due to "drugs", primarily used cocaine.  Has 3-5 year hx of HTN, no DM.  Gets HD on TTS schedule at [facility].  [name] from [place] and recently moved here.    . Hypercalcemia   . Hyperpotassemia   . Seizures     INTERIM HISTORY: has Hypertension; Diabetes mellitus without complication; ESRD on hemodialysis; Seizure disorder; Normocytic anemia; Cocaine abuse; Tobacco abuse; GI bleed; Hemorrhage of rectum and anus; Anemia; Heme + stool; Malnutrition of moderate degree; Microcytic anemia; and Acute blood loss anemia on his problem list.    ALLERGIES:  is  allergic to reglan.  MEDICATIONS: has a current medication list which includes the following prescription(s): calcium acetate, feeding supplement (resource breeze), hydralazine, levetiracetam, multivitamin, pantoprazole, and zolpidem.  SURGICAL HISTORY:  Past Surgical History  Procedure Laterality Date  . Shoulder open rotator cuff repair Right   . Total knee arthroplasty Left   . Bascilic vein transposition  03/07/2012    Procedure: BASCILIC VEIN TRANSPOSITION;  Surgeon: Conrad Allen, MD;  Location: [facility];  Service: Vascular;  Laterality: Left;  First Stage  . Insertion of dialysis catheter      right chest  . Bascilic vein transposition Left 05/31/2012    Procedure: BASCILIC VEIN  TRANSPOSITION;  Surgeon: Conrad , MD;  Location: [facility];  Service: Vascular;  Laterality: Left;  Left 2nd Stage Basilic Vein Transposition with gortex graft revision using 1mx10cm graft  . Shoulder open rotator cuff repair Right   . [name] capsule study N/A 08/27/2013    Procedure: [name] CAPSULE STUDY;  Surgeon: DMilus Banister MD;  [place] [facility]  Service: Endoscopy;  Laterality: N/A;   Family History  Problem Relation Age of Onset  . Diabetes Mother   . Hypertension Mother   . Stroke Mother   . Kidney failure Mother   . Cancer Father    REVIEW OF SYSTEMS:   Constitutional: Denies fevers, chills or abnormal weight loss Eyes: Denies blurriness of vision Ears, nose, mouth, throat, and face: Denies mucositis or sore throat Respiratory: Denies cough, dyspnea or wheezes Cardiovascular: Denies palpitation, chest discomfort or lower extremity swelling Gastrointestinal:  Denies nausea, heartburn or change in bowel habits Skin: Denies abnormal skin rashes Lymphatics: Denies new lymphadenopathy or easy bruising Neurological:Denies numbness, tingling or new weaknesses Behavioral/Psych: Mood is a bit down due to his co-morbidities as noted in HPI, no new changes  All other systems were reviewed with the patient and are negative.  PHYSICAL EXAMINATION: ECOG PERFORMANCE STATUS: 0 - Asymptomatic  Blood pressure 135/54, pulse 73, temperature 98.4 F (36.9 C), temperature source Oral, resp. rate 18, height 5' 9"  (1.753 m), weight 129 lb 8 oz (58.741 kg), SpO2 100.00%.  GENERAL:alert, no distress and comfortable; thin male who appears older than his stated age.  SKIN: skin color, texture, turgor are normal, no rashes or significant lesions EYES: normal, Conjunctiva are pink and non-injected, sclera clear OROPHARYNX:no exudate, no erythema and lips, buccal mucosa, and tongue normal; poor dentition w multiple dental caries.  NECK: supple, thyroid normal size, non-tender, without  nodularity LYMPH:  no palpable lymphadenopathy in the cervical, axillary or supraclavicular LUNGS: clear to auscultation and percussion with normal breathing effort HEART: regular rate & rhythm and no murmurs and no lower extremity edema ABDOMEN:abdomen soft, non-tender and normal bowel sounds; no splenomegaly Musculoskeletal:no cyanosis of digits and no clubbing; Left upper extremity AV-fistula with good thrill. Chronic venous changes of lower extremities bilaterally.  NEURO: alert & oriented x 3 with fluent speech, no focal motor/sensory deficits  Labs:  Lab Results  Component Value Date   WBC 5.6 08/31/2013   HGB 10.8* 08/31/2013   HCT 34.0* 08/31/2013   MCV 87.8 08/31/2013   PLT 299 08/31/2013   NEUTROABS 4.0 08/31/2013      Chemistry      Component Value Date/Time   NA 144 08/31/2013 1010   NA 135* 08/27/2013 0550   K 4.2 08/31/2013 1010   K 5.1 08/27/2013 0550   CL 96 08/27/2013 0550   CO2 30* 08/31/2013 1010   CO2 23 08/27/2013 0550  BUN 31.6* 08/31/2013 1010   BUN 67* 08/27/2013 0550   CREATININE 5.7* 08/31/2013 1010   CREATININE 6.73* 08/27/2013 0550      Component Value Date/Time   CALCIUM 9.1 08/31/2013 1010   CALCIUM 9.1 08/27/2013 0550   ALKPHOS 41 08/22/2013 0650   ALKPHOS 54 08/01/2013 1201   AST 18 08/22/2013 0650   AST 14 08/01/2013 1201   ALT 10 08/22/2013 0650   ALT <6 08/01/2013 1201   BILITOT 0.7 08/22/2013 0650   BILITOT 0.37 08/01/2013 1201     Results for HARLIN, MAZZONI (MRN [id number]) as of 08/01/2013 13:07  Ref. Range 05/04/2012 17:45 11/07/2012 12:51 01/09/2013 01:15 06/21/2013 14:27 07/07/2013 12:25  Fecal Occult Blood, POC Latest Range: NEGATIVE  POSITIVE (A) POSITIVE (A) POSITIVE (A) POSITIVE (A) POSITIVE (A)   Results for AURELIUS, GILDERSLEEVE (MRN [id number]) as of 08/31/2013 10:57  Ref. Range 03/03/2012 16:43 03/03/2012 16:44  Total Protein ELP Latest Range: 6.0-8.3 g/dL 5.3 (L)   Albumin ELP Latest Range: 55.8-66.1 % 51.5 (L)   Alpha-1-Globulin Latest Range: 2.9-4.9 % 7.5  (H)   Alpha-2-Globulin Latest Range: 7.1-11.8 % 11.2   Beta Globulin Latest Range: 4.7-7.2 % 4.4 (L)   Beta 2 Latest Range: 3.2-6.5 % 4.4   Gamma Globulin Latest Range: 11.1-18.8 % 21.0 (H)   M-SPIKE, % No range found NOT DETECTED   SPE Interp. No range found (NOTE)   Comment No range found (NOTE)   Kappa free light chain Latest Range: 0.33-1.94 mg/dL  62.40 (H)  Lamda free light chains Latest Range: 0.57-2.63 mg/dL  17.40 (H)  Kappa, lamda light chain ratio Latest Range: 0.26-1.65   3.59 (H)   RADIOGRAPHIC STUDIES: None  PATHOLOGY: NORMOCELLULAR BONE MARROW FOR AGE WITH ATYPICAL LYMPHOID AGGREGATES. - TRILINEAGE HEMATOPOIESIS. - SEE COMMENT PERIPHERAL BLOOD: - NORMOCYTIC-NORMOCHROMIC ANEMIA. Diagnosis Note The bone marrow shows several variably sized interstitial and paratrabecular lymphoid aggregates mostly composed of small lymphoid cells. Flow cytometric analysis failed to show any significant changes likely due to sampling. Immunohistochemical stains performed on core biopsy show that the lymphoid aggregates are a mixture of T and B cells but with a significant B cell component. B cells in particular lack CD5, CD10, or cyclin D1 expression. The overall histologic and phenotypic features are worrisome for involvement by a B cell lymphoproliferative process. Full hematologic evaluation is recommended  FLOW CYTOMETRY: - PREDOMINANCE OF T CELLS WITH NO ABNORMAL PHENOTYPE. - MINOR B CELL POPULATION PRESENT. - SEE NOTE. Diagnosis Comment: There is a minor B cell population present (14% of lymphocytes) expressing pan B cell antigens but with extremely dim/negative staining for surface immunoglobulin light chains that hinder assessment and/or quantitation of clonality. However, an abnormal B cell phenotype such as co-expression of CD5 and CD20 or CD10 expression is not identified. Clinical correlation is recommended. (BNS:gt, 01/09/13) Joella Prince SMIR MD  REPEAT BONE MARROW BIOPSY ON  06/25/2013. Diagnosis Bone Marrow, Aspirate,Biopsy, and Clot, right iliac - NORMOCELLULAR BONE MARROW FOR AGE WITH TRILINEAGE HEMATOPOIESIS. - SEVERAL LYMPHOID AGGREGATES PRESENT. - SEE COMMENT. PERIPHERAL BLOOD: - NORMOCYTIC-NORMOCHROMIC ANEMIA. Diagnosis Note The overall morphologic and phenotypic features are similar to previous bone marrow ([id number]) and worrisome for minimal involvement by a B-cell lymphoproliferative process which cannot be excluded. As in the previous material, the changes are limited and not considered definitive or diagnostic of malignancy. Clinical correlation is recommended. Susanne Greenhouse MD Pathologist, Electronic Signature (Case signed 06/28/2013) SS AND MICROSCOPIC INFORMATION ASSESSMENT: [name] 58 y.o. male with a history of Anemia - Plan:  CBC with Differential, CBC with Differential, Comprehensive metabolic panel (Cmet) - [facility], Lactate dehydrogenase (LDH) - [facility], SPEP & IFE with QIG, Kappa/lambda light chains   PLAN:  1. Minor B cell population (17% of lymphocytes)  ----We reviewed his pathology with Dr. Gari Crown of pathology. 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 He does not have lymphadenopathy on exam. He does not have constitutional symptoms such as night sweats, fevers or weight lost.  Bone marrow overall morphologic and phenotypic features are similar to previous bone marrow ([id number]) and worrisome for minimal involvement by a B-cell lymphoproliferative process which cannot be excluded.  He can be reached at [phone].  --  His white blood cell count is 5.6 with normal plalets.  At this point, given his more active co-morbidities and history of non-compliance, it might be reasonable to actively observe closely for now.   2. Mixed anemia.  --Likely secondary to his ESRDz.   His hemoglobin of  10.8 today.  He has a history of non-compliance with dialysis.  [facility] present HD [facility] on [facility]Thurs/Sat. He  was on aranesp with dialysis, 200/week.   --Patient does however have a positive FOBT and had capsule endoscopy this last admission.  We will follow the results (takes 2 weeks).  He has follow up on 05/21.  Check CBC every 2 weeks for possible transfusion.   3. ESRDz on dialysis.    --As noted above, he will continue his dialysis.  He has a history of non-compliance.    4.  Polysubstance abuse including crack cocaine, tobacco abuse . --I discussed the importance of abstinence and offered assistance through social support programs, tobacco cessation abuse counseling and the patient declined smoking cessation.  5. Follow-up. --Patient instructed to follow up with [place] for repeat CBC, Chemistries in 1 month. CBC every 2 weeks.  Next lab on 05/26 at patient's request.  He has a court date on 05/28.  All questions were answered. The patient knows to call the clinic with any problems, questions or concerns. We can certainly see the patient much sooner if necessary. He was provided an after visit summary.   I spent 15 minutes counseling the patient face to face. The total time spent in the appointment was 25 minutes.    Concha Norway, MD 08/31/2013 11:09 AM

## 2013-08-31 NOTE — Telephone Encounter (Signed)
gv andp drnted appt scehd adn avs for pt for May and June

## 2013-09-06 ENCOUNTER — Ambulatory Visit: Payer: Medicare Other | Admitting: Internal Medicine

## 2013-09-11 ENCOUNTER — Telehealth: Payer: Self-pay | Admitting: Internal Medicine

## 2013-09-11 ENCOUNTER — Other Ambulatory Visit: Payer: Self-pay

## 2013-09-11 ENCOUNTER — Other Ambulatory Visit: Payer: Medicare Other

## 2013-09-11 NOTE — Telephone Encounter (Signed)
, °

## 2013-09-13 ENCOUNTER — Encounter: Payer: Self-pay | Admitting: Internal Medicine

## 2013-09-14 ENCOUNTER — Other Ambulatory Visit (HOSPITAL_BASED_OUTPATIENT_CLINIC_OR_DEPARTMENT_OTHER): Payer: Medicare Other

## 2013-09-14 DIAGNOSIS — D649 Anemia, unspecified: Secondary | ICD-10-CM

## 2013-09-14 LAB — CBC WITH DIFFERENTIAL/PLATELET
BASO%: 1.4 % (ref 0.0–2.0)
Basophils Absolute: 0.1 10*3/uL (ref 0.0–0.1)
EOS%: 2.9 % (ref 0.0–7.0)
Eosinophils Absolute: 0.2 10*3/uL (ref 0.0–0.5)
HCT: 33.9 % — ABNORMAL LOW (ref 38.4–49.9)
HGB: 10.5 g/dL — ABNORMAL LOW (ref 13.0–17.1)
LYMPH#: 0.8 10*3/uL — AB (ref 0.9–3.3)
LYMPH%: 13.8 % — ABNORMAL LOW (ref 14.0–49.0)
MCH: 28.3 pg (ref 27.2–33.4)
MCHC: 31.1 g/dL — ABNORMAL LOW (ref 32.0–36.0)
MCV: 91 fL (ref 79.3–98.0)
MONO#: 0.7 10*3/uL (ref 0.1–0.9)
MONO%: 11.5 % (ref 0.0–14.0)
NEUT%: 70.4 % (ref 39.0–75.0)
NEUTROS ABS: 4 10*3/uL (ref 1.5–6.5)
Platelets: 209 10*3/uL (ref 140–400)
RBC: 3.72 10*6/uL — ABNORMAL LOW (ref 4.20–5.82)
RDW: 19.8 % — ABNORMAL HIGH (ref 11.0–14.6)
WBC: 5.7 10*3/uL (ref 4.0–10.3)

## 2013-09-18 LAB — SPEP & IFE WITH QIG
ALBUMIN ELP: 49.7 % — AB (ref 55.8–66.1)
Alpha-1-Globulin: 5.1 % — ABNORMAL HIGH (ref 2.9–4.9)
Alpha-2-Globulin: 9.4 % (ref 7.1–11.8)
BETA 2: 3.8 % (ref 3.2–6.5)
Beta Globulin: 4.9 % (ref 4.7–7.2)
GAMMA GLOBULIN: 27.1 % — AB (ref 11.1–18.8)
IgA: 181 mg/dL (ref 68–379)
IgG (Immunoglobin G), Serum: 2170 mg/dL — ABNORMAL HIGH (ref 650–1600)
IgM, Serum: 204 mg/dL (ref 41–251)
Total Protein, Serum Electrophoresis: 7.1 g/dL (ref 6.0–8.3)

## 2013-09-18 LAB — KAPPA/LAMBDA LIGHT CHAINS
KAPPA LAMBDA RATIO: 2.32 — AB (ref 0.26–1.65)
Kappa free light chain: 62.9 mg/dL — ABNORMAL HIGH (ref 0.33–1.94)
Lambda Free Lght Chn: 27.1 mg/dL — ABNORMAL HIGH (ref 0.57–2.63)

## 2013-09-28 ENCOUNTER — Other Ambulatory Visit: Payer: Medicare Other

## 2013-09-28 ENCOUNTER — Ambulatory Visit: Payer: Medicare Other

## 2013-11-07 ENCOUNTER — Ambulatory Visit (INDEPENDENT_AMBULATORY_CARE_PROVIDER_SITE_OTHER): Payer: Medicare Other | Admitting: Internal Medicine

## 2013-11-07 ENCOUNTER — Other Ambulatory Visit: Payer: Self-pay | Admitting: Internal Medicine

## 2013-11-07 ENCOUNTER — Telehealth: Payer: Self-pay | Admitting: Internal Medicine

## 2013-11-07 ENCOUNTER — Encounter: Payer: Self-pay | Admitting: Internal Medicine

## 2013-11-07 VITALS — BP 128/66 | HR 68 | Ht 69.0 in | Wt 131.6 lb

## 2013-11-07 DIAGNOSIS — R195 Other fecal abnormalities: Secondary | ICD-10-CM

## 2013-11-07 DIAGNOSIS — D649 Anemia, unspecified: Secondary | ICD-10-CM

## 2013-11-07 DIAGNOSIS — N186 End stage renal disease: Secondary | ICD-10-CM

## 2013-11-07 DIAGNOSIS — Z86010 Personal history of colonic polyps: Secondary | ICD-10-CM

## 2013-11-07 NOTE — Telephone Encounter (Signed)
s/w pt re appt for 7/24

## 2013-11-07 NOTE — Progress Notes (Signed)
HISTORY OF PRESENT ILLNESS:  [name] is a 58 y.o. male with multiple significant medical problems including end-stage renal disease on hemodialysis, diabetes, hepatitis C, alcohol or drug abuse, chronic anemia, and medical noncompliance. He was last evaluated in this office 05/28/2013 regarding chronic anemia. He has had multiple prior GI workup (elsewhere/review) including several upper endoscopies, enteroscopy, and colonoscopy as previously outlined. Minimal pathology including mild gastritis and benign adenomatous colon polyp. At the time of his last visit, his anemia was felt to be grossly out of portion to occult positive stools. There is no clinical history of overt GI bleeding. The recommendation was for hematology evaluation and capsule endoscopy. He has completed both. Hematology evaluation included bone marrow biopsy and questioned abnormal B cell population. His anemia was felt to be multifactorial, but mostly on the basis of his renal disease. He has been more compliant with his dialysis. Apparently receives Aranesp with dialysis. [name] on a multivitamin. Not sure if it contains iron. He subsequently completed capsule endoscopy 08/27/2013. The examination was complete with good preparation. No significant pathology found. He was sent here today by nephrology for followup. Patient denies any overt GI bleeding. His GI review of systems is negative. I reviewed with him his entire GI workup to date.  REVIEW OF SYSTEMS:  All non-GI ROS negative upon review  Past Medical History  Diagnosis Date  . Hypertension   . Anemia, chronic disease   . CHF (congestive heart failure)     diastolic.  EF 60 - 65% per [facility] eco 11/2011  . Cocaine abuse     mentioned in notes from [place]  . Hepatitis C antibody test positive     was HIV negative, 02/28/12  . Hepatitis B core antibody positive     03/01/10  . Positive QuantiFERON-TB Gold test     11/2011  . Helicobacter pylori gastritis     not  defined if this was treated  . Polyp of colon, adenomatous     May 2012.  Dr Trenton Founds in [place]  . Shortness of breath   . Hematochezia   . Head injury, closed, with concussion   . History of blood transfusion     "last one was 2 days ago" (11/09/2012)  . Type II diabetes mellitus     on oral pills only  . Arthritis     "right shoulder" (11/09/2012)  . Seizure disorder     questionable history of - will need to clarify with PCP  . Headache(784.0)     "q other day" (11/09/2012)  . ESRD on hemodialysis since 2012    Patient says he started HD in 2012.  ESRD was due to "drugs", primarily used cocaine.  Has 3-5 year hx of HTN, no DM.  Gets HD on TTS schedule at [facility].  [name] from [place] and recently moved here.    . Hypercalcemia   . Hyperpotassemia   . Seizures     Past Surgical History  Procedure Laterality Date  . Shoulder open rotator cuff repair Right   . Total knee arthroplasty Left   . Bascilic vein transposition  03/07/2012    Procedure: BASCILIC VEIN TRANSPOSITION;  Surgeon: Conrad [place], MD;  Location: [facility];  Service: Vascular;  Laterality: Left;  First Stage  . Insertion of dialysis catheter      right chest  . Bascilic vein transposition Left 05/31/2012    Procedure: BASCILIC VEIN TRANSPOSITION;  Surgeon: Conrad Hyd[place], MD;  [facility];  Service: Vascular;  Laterality: Left;  Left 2nd Stage Basilic Vein Transposition with gortex graft revision using 43mx10cm graft  . Shoulder open rotator cuff repair Right   . [name] capsule study N/A 08/27/2013    Procedure: [name] CAPSULE STUDY;  Surgeon: DMilus Banister MD;  [place] [facility]  Service: Endoscopy;  Laterality: N/A;    Social History CToivo [name] reports that he has been smoking Cigarettes.  He has a 6.25 pack-year smoking history. He has never used smokeless tobacco. He reports that he uses illicit drugs ("Crack" cocaine and Marijuana). He reports that he does not drink alcohol.  family  history includes Cancer in his father; Diabetes in his mother; Hypertension in his mother; Kidney failure in his mother; Stroke in his mother.  Allergies  Allergen Reactions  . Reglan [Metoclopramide] Other (See Comments)    [place] Vital signs: BP 128/66  Pulse 68  Ht 5' 9"  (1.753 m)  Wt 131 lb 9.6 oz (59.693 kg)  BMI 19.42 kg/m2 General: Well-developed, well-nourished, no acute distress HEENT: Sclerae are anicteric, conjunctiva pink. Oral mucosa intact Lungs: Clear Heart: Regular Abdomen: soft, nontender, nondistended, no obvious ascites, no peritoneal signs, normal bowel sounds. No organomegaly. Extremities: No edema Psychiatric: alert and oriented x3. Cooperative   ASSESSMENT:  #1. Multifactorial anemia. Comprehensive GI evaluation and comprehensive hematology evaluation as noted. No significant GI mucosal pathology identified. Occult GI blood loss felt to be noncontributory or or minor contributor to his recurrent anemia. #2. Previous EGD with mild gastritis only. On chronic PPI #3. Small colonic adenoma on colonoscopy. Due for surveillance colonoscopy in 2017 #4. Multiple significant medical problems including end-stage renal disease on hemodialysis.   PLAN:  #1. Continue PPI for upper GI mucosal protection #2. Continue iron supplement, Aranesp, and as needed transfusions to be provided dialysis center (who should monitor his hemoglobin with some regularity) #3. Stop Hemocculting stools!   In the absence of overt GI bleeding, this leads to unnecessary repeated evaluations. However, for significant overt GI bleeding, reevaluation is warranted. #4. Surveillance colonoscopy in 2017 if medically fit. Patient aware #5. Return to the care of your primary provider and nephrology. GI followup as needed

## 2013-11-07 NOTE — Patient Instructions (Signed)
Please follow up with Dr. Henrene Pastor as needed.  You are scheduled for a recall colonoscopy in 2017.  You will get a letter in advance to remind you to call and schedule it.

## 2013-11-09 ENCOUNTER — Telehealth: Payer: Self-pay | Admitting: Internal Medicine

## 2013-11-09 ENCOUNTER — Ambulatory Visit (HOSPITAL_BASED_OUTPATIENT_CLINIC_OR_DEPARTMENT_OTHER): Payer: Medicare Other | Admitting: Internal Medicine

## 2013-11-09 ENCOUNTER — Other Ambulatory Visit (HOSPITAL_BASED_OUTPATIENT_CLINIC_OR_DEPARTMENT_OTHER): Payer: Medicare Other

## 2013-11-09 VITALS — BP 150/81 | HR 90 | Temp 98.3°F | Resp 18 | Ht 69.0 in | Wt 127.4 lb

## 2013-11-09 DIAGNOSIS — Z720 Tobacco use: Secondary | ICD-10-CM

## 2013-11-09 DIAGNOSIS — D649 Anemia, unspecified: Secondary | ICD-10-CM

## 2013-11-09 DIAGNOSIS — Z992 Dependence on renal dialysis: Secondary | ICD-10-CM

## 2013-11-09 DIAGNOSIS — N186 End stage renal disease: Secondary | ICD-10-CM

## 2013-11-09 DIAGNOSIS — E119 Type 2 diabetes mellitus without complications: Secondary | ICD-10-CM

## 2013-11-09 DIAGNOSIS — F17200 Nicotine dependence, unspecified, uncomplicated: Secondary | ICD-10-CM

## 2013-11-09 LAB — CBC WITH DIFFERENTIAL/PLATELET
BASO%: 3.3 % — ABNORMAL HIGH (ref 0.0–2.0)
Basophils Absolute: 0.1 10*3/uL (ref 0.0–0.1)
EOS%: 5.1 % (ref 0.0–7.0)
Eosinophils Absolute: 0.2 10*3/uL (ref 0.0–0.5)
HEMATOCRIT: 45.2 % (ref 38.4–49.9)
HGB: 14.1 g/dL (ref 13.0–17.1)
LYMPH%: 25.2 % (ref 14.0–49.0)
MCH: 27.9 pg (ref 27.2–33.4)
MCHC: 31.2 g/dL — AB (ref 32.0–36.0)
MCV: 89.4 fL (ref 79.3–98.0)
MONO#: 0.5 10*3/uL (ref 0.1–0.9)
MONO%: 12.4 % (ref 0.0–14.0)
NEUT#: 2.2 10*3/uL (ref 1.5–6.5)
NEUT%: 54 % (ref 39.0–75.0)
PLATELETS: 246 10*3/uL (ref 140–400)
RBC: 5.05 10*6/uL (ref 4.20–5.82)
RDW: 17.5 % — ABNORMAL HIGH (ref 11.0–14.6)
WBC: 4 10*3/uL (ref 4.0–10.3)
lymph#: 1 10*3/uL (ref 0.9–3.3)

## 2013-11-09 LAB — COMPREHENSIVE METABOLIC PANEL (CC13)
ALT: 22 U/L (ref 0–55)
ANION GAP: 13 meq/L — AB (ref 3–11)
AST: 33 U/L (ref 5–34)
Albumin: 3.5 g/dL (ref 3.5–5.0)
Alkaline Phosphatase: 74 U/L (ref 40–150)
BILIRUBIN TOTAL: 0.59 mg/dL (ref 0.20–1.20)
BUN: 28.5 mg/dL — ABNORMAL HIGH (ref 7.0–26.0)
CO2: 32 mEq/L — ABNORMAL HIGH (ref 22–29)
CREATININE: 6.6 mg/dL — AB (ref 0.7–1.3)
Calcium: 9.4 mg/dL (ref 8.4–10.4)
Chloride: 95 mEq/L — ABNORMAL LOW (ref 98–109)
Glucose: 77 mg/dl (ref 70–140)
Potassium: 4.6 mEq/L (ref 3.5–5.1)
Sodium: 141 mEq/L (ref 136–145)
Total Protein: 9.2 g/dL — ABNORMAL HIGH (ref 6.4–8.3)

## 2013-11-09 LAB — LACTATE DEHYDROGENASE (CC13): LDH: 210 U/L (ref 125–245)

## 2013-11-09 NOTE — Progress Notes (Signed)
[facility] OFFICE PROGRESS NOTE  Windy Kalata, MD [place] [address]  DIAGNOSIS: Normocytic anemia  ESRD on hemodialysis  Diabetes mellitus without complication  Tobacco abuse  Chief Complaint  Patient presents with  . Follow-up    CURRENT THERAPY: Observation.   INTERVAL HISTORY: [name] 58 y.o. male with a history of polysubstance abuse including crack cocaine and tobacco, ESRDz on hemodialysis (Tues,Thursday, Saturday) and hypertension, anemia of chronic disease, and diabetes mellitus is here for follow-up.  He was last seen by me on 08/31/2013. He was evaluated by Dr. Henrene Pastor yesterday who recommend continued PPI for upper GI protection, iron supplemntation and aranesp and a surveillance colonoscopy in 2017.   He was recently seen by me in the hospital on 08/23/2013 for recurrent anemia.  GI workup including capsule endoscopy was obtained the day prior to discharge.  The results are pending.  He also had a CT of abdomen which was negative.  He reports dark stools about one week prior to this.  He endorses current crack cocaine as recent as two weeks ago.  Prior to that he also also seen by me in the office on 08/03/2013.  He was lost to laboratory follow up.  Hemolytic work up for his anemia was negative. On prior admission to hospital, his GI workup was negative at that time but FOBT was positive and he has been scheduled for capsule endoscopy with Dr. Henrene Pastor on 09/06/2013.  He had a second bone marrow biopsy as indicated below.  It revealed some atypical lymphoid aggregates but his cytogenetics were within normal limits.  Previously, he was lost-to-follow up in the clinic.    MEDICAL HISTORY: Past Medical History  Diagnosis Date  . Hypertension   . Anemia, chronic disease   . CHF (congestive heart failure)     diastolic.  EF 60 - 65% per [facility] eco 11/2011  . Cocaine abuse     mentioned in notes from [place]  . Hepatitis C antibody  test positive     was HIV negative, 02/28/12  . Hepatitis B core antibody positive     03/01/10  . Positive QuantiFERON-TB Gold test     11/2011  . Helicobacter pylori gastritis     not defined if this was treated  . Polyp of colon, adenomatous     May 2012.  Dr Trenton Founds in [place]  . Shortness of breath   . Hematochezia   . Head injury, closed, with concussion   . History of blood transfusion     "last one was 2 days ago" (11/09/2012)  . Type II diabetes mellitus     on oral pills only  . Arthritis     "right shoulder" (11/09/2012)  . Seizure disorder     questionable history of - will need to clarify with PCP  . Headache(784.0)     "q other day" (11/09/2012)  . ESRD on hemodialysis since 2012    Patient says he started HD in 2012.  ESRD was due to "drugs", primarily used cocaine.  Has 3-5 year hx of HTN, no DM.  Gets HD on TTS schedule at [place].  [name] from [place] and recently moved here.    . Hypercalcemia   . Hyperpotassemia   . Seizures     INTERIM HISTORY: has Hypertension; Diabetes mellitus without complication; ESRD on hemodialysis; Seizure disorder; Normocytic anemia; Cocaine abuse; Tobacco abuse; GI bleed; Hemorrhage of rectum and anus; Anemia; Heme + stool;  Malnutrition of moderate degree; Microcytic anemia; and Acute blood loss anemia on his problem list.    ALLERGIES:  is allergic to reglan.  MEDICATIONS: has a current medication list which includes the following prescription(s): calcium acetate, feeding supplement (resource breeze), hydralazine, levetiracetam, multivitamin, pantoprazole, and zolpidem.  SURGICAL HISTORY:  Past Surgical History  Procedure Laterality Date  . Shoulder open rotator cuff repair Right   . Total knee arthroplasty Left   . Bascilic vein transposition  03/07/2012    Procedure: BASCILIC VEIN TRANSPOSITION;  Surgeon: Conrad Fowlerton, MD;  Location: [facility];  Service: Vascular;  Laterality: Left;  First Stage  . Insertion of  dialysis catheter      right chest  . Bascilic vein transposition Left 05/31/2012    Procedure: BASCILIC VEIN TRANSPOSITION;  Surgeon: Conrad [place] Park, MD;  Location: [facility];  Service: Vascular;  Laterality: Left;  Left 2nd Stage Basilic Vein Transposition with gortex graft revision using [id number] graft  . Shoulder open rotator cuff repair Right   . [name] capsule study N/A 08/27/2013    Procedure: [name] CAPSULE STUDY;  Surgeon: DMilus Banister MD;  [place]  Service: Endoscopy;  Laterality: N/A;   Family History  Problem Relation Age of Onset  . Diabetes Mother   . Hypertension Mother   . Stroke Mother   . Kidney failure Mother   . Cancer Father    REVIEW OF SYSTEMS:   Constitutional: Denies fevers, chills or abnormal weight loss Eyes: Denies blurriness of vision Ears, nose, mouth, throat, and face: Denies mucositis or sore throat Respiratory: Denies cough, dyspnea or wheezes Cardiovascular: Denies palpitation, chest discomfort or lower extremity swelling Gastrointestinal:  Denies nausea, heartburn or change in bowel habits Skin: Denies abnormal skin rashes Lymphatics: Denies new lymphadenopathy or easy bruising Neurological:Denies numbness, tingling or new weaknesses Behavioral/Psych: Mood is a bit down due to his co-morbidities as noted in HPI, no new changes  All other systems were reviewed with the patient and are negative.  PHYSICAL EXAMINATION: ECOG PERFORMANCE STATUS: 0 - Asymptomatic  Blood pressure 150/81, pulse 90, temperature 98.3 F (36.8 C), temperature source Oral, resp. rate 18, height 5' 9"  (1.753 m), weight 127 lb 6.4 oz (57.788 kg), SpO2 98.00%.  GENERAL:alert, no distress and comfortable; thin male who appears older than his stated age.  SKIN: skin color, texture, turgor are normal, no rashes or significant lesions EYES: normal, Conjunctiva are pink and non-injected, sclera clear OROPHARYNX:no exudate, no erythema and lips, buccal mucosa, and  tongue normal; poor dentition w multiple dental caries.  NECK: supple, thyroid normal size, non-tender, without nodularity LYMPH:  no palpable lymphadenopathy in the cervical, axillary or supraclavicular LUNGS: clear to auscultation and percussion with normal breathing effort HEART: regular rate & rhythm and no murmurs and no lower extremity edema ABDOMEN:abdomen soft, non-tender and normal bowel sounds; no splenomegaly Musculoskeletal:no cyanosis of digits and no clubbing; Left upper extremity AV-fistula with good thrill. Chronic venous changes of lower extremities bilaterally.  NEURO: alert & oriented x 3 with fluent speech, no focal motor/sensory deficits  Labs:  Lab Results  Component Value Date   WBC 4.0 11/09/2013   HGB 14.1 11/09/2013   HCT 45.2 11/09/2013   MCV 89.4 11/09/2013   PLT 246 11/09/2013   NEUTROABS 2.2 11/09/2013      Chemistry      Component Value Date/Time   NA 144 08/31/2013 1010   NA 135* 08/27/2013 0550   K 4.2 08/31/2013 1010   K  5.1 08/27/2013 0550   CL 96 08/27/2013 0550   CO2 30* 08/31/2013 1010   CO2 23 08/27/2013 0550   BUN 31.6* 08/31/2013 1010   BUN 67* 08/27/2013 0550   CREATININE 5.7* 08/31/2013 1010   CREATININE 6.73* 08/27/2013 0550      Component Value Date/Time   CALCIUM 9.1 08/31/2013 1010   CALCIUM 9.1 08/27/2013 0550   ALKPHOS 41 08/22/2013 0650   ALKPHOS 54 08/01/2013 1201   AST 18 08/22/2013 0650   AST 14 08/01/2013 1201   ALT 10 08/22/2013 0650   ALT <6 08/01/2013 1201   BILITOT 0.7 08/22/2013 0650   BILITOT 0.37 08/01/2013 1201     RADIOGRAPHIC STUDIES: None  PATHOLOGY: NORMOCELLULAR BONE MARROW FOR AGE WITH ATYPICAL LYMPHOID AGGREGATES. - TRILINEAGE HEMATOPOIESIS. - SEE COMMENT PERIPHERAL BLOOD: - NORMOCYTIC-NORMOCHROMIC ANEMIA. Diagnosis Note The bone marrow shows several variably sized interstitial and paratrabecular lymphoid aggregates mostly composed of small lymphoid cells. Flow cytometric analysis failed to show any significant changes  likely due to sampling. Immunohistochemical stains performed on core biopsy show that the lymphoid aggregates are a mixture of T and B cells but with a significant B cell component. B cells in particular lack CD5, CD10, or cyclin D1 expression. The overall histologic and phenotypic features are worrisome for involvement by a B cell lymphoproliferative process. Full hematologic evaluation is recommended  FLOW CYTOMETRY: - PREDOMINANCE OF T CELLS WITH NO ABNORMAL PHENOTYPE. - MINOR B CELL POPULATION PRESENT. - SEE NOTE. Diagnosis Comment: There is a minor B cell population present (14% of lymphocytes) expressing pan B cell antigens but with extremely dim/negative staining for surface immunoglobulin light chains that hinder assessment and/or quantitation of clonality. However, an abnormal B cell phenotype such as co-expression of CD5 and CD20 or CD10 expression is not identified. Clinical correlation is recommended. (BNS:gt, 01/09/13) Joella Prince SMIR MD  REPEAT BONE MARROW BIOPSY ON 06/25/2013. Diagnosis Bone Marrow, Aspirate,Biopsy, and Clot, right iliac - NORMOCELLULAR BONE MARROW FOR AGE WITH TRILINEAGE HEMATOPOIESIS. - SEVERAL LYMPHOID AGGREGATES PRESENT. - SEE COMMENT. PERIPHERAL BLOOD: - NORMOCYTIC-NORMOCHROMIC ANEMIA. Diagnosis Note The overall morphologic and phenotypic features are similar to previous bone marrow ([id number]) and worrisome for minimal involvement by a B-cell lymphoproliferative process which cannot be excluded. As in the previous material, the changes are limited and not considered definitive or diagnostic of malignancy. Clinical correlation is recommended. Susanne Greenhouse MD Pathologist, Electronic Signature (Case signed 06/28/2013) SS AND MICROSCOPIC INFORMATION ASSESSMENT: [name] 58 y.o. male with a history of Normocytic anemia  ESRD on hemodialysis  Diabetes mellitus without complication  Tobacco abuse   PLAN:  1. Minor B cell population (17% of  lymphocytes)  ----We reviewed his pathology with Dr. Gari Crown of pathology. 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 He does not have lymphadenopathy on exam. He does not have constitutional symptoms such as night sweats, fevers or weight lost.  Bone marrow overall morphologic and phenotypic features are similar to previous bone marrow ([id number]) and worrisome for minimal involvement by a B-cell lymphoproliferative process which cannot be excluded.  He can be reached at [phone].  --  His white blood cell count is 5.6 with normal plalets.  At this point, given his more active co-morbidities and history of non-compliance, it might be reasonable to actively observe closely for now.   2. Mixed anemia.  --Likely secondary to his ESRDz.   His hemoglobin of  10.8 today.  He has a history of non-compliance with dialysis.  Continue present  HD at [facility] on T/Thurs/Sat. He was on aranesp with dialysis, 200/week.   --Patient does however have a positive FOBT and had capsule endoscopy this last admission.  We will follow the results (takes 2 weeks).  He has follow up on 05/21.  Check CBC every 2 weeks for possible transfusion.   3. ESRDz on dialysis.    --As noted above, he will continue his dialysis.  He has a history of non-compliance.    4.  Polysubstance abuse including crack cocaine, tobacco abuse . --I discussed the importance of abstinence and offered assistance through social support programs, tobacco cessation abuse counseling and the patient declined smoking cessation.  5. Follow-up. --Patient instructed to follow up with [place] for repeat CBC, Chemistries in 1 month. CBC every 2 weeks.  Next lab on 05/26 at patient's request.  He has a court date on 05/28.  All questions were answered. The patient knows to call the clinic with any problems, questions or concerns. We can certainly see the patient much sooner if necessary. He was provided an after visit summary.    I spent 15 minutes counseling the patient face to face. The total time spent in the appointment was 25 minutes.    Damarien Nyman, MD 11/09/2013 10:07 AM

## 2013-11-09 NOTE — Telephone Encounter (Signed)
gv and rpinted appt sched and avs fo rpt for Aug and Sept

## 2013-11-09 NOTE — Progress Notes (Signed)
[name] OFFICE PROGRESS NOTE  MATTINGLY,MICHAEL T, MD [place] [address]  DIAGNOSIS: Normocytic anemia - Plan: CBC with Differential, CBC with Differential in 2 months, Basic metabolic panel (Bmet) - [facility]  ESRD on hemodialysis - Plan: Basic metabolic panel (Bmet) - [facility]  Diabetes mellitus without complication  Tobacco abuse  Chief Complaint  Patient presents with  . Follow-up    CURRENT THERAPY: Observation.   INTERVAL HISTORY: [name] 58 y.o. male with a history of polysubstance abuse including crack cocaine and tobacco, ESRDz on hemodialysis (Tues,Thursday, Saturday) and hypertension, anemia of chronic disease, and diabetes mellitus is here for follow-up.    He was also seen by me in the hospital on 08/23/2013 for recurrent anemia.  GI workup including capsule endoscopy was obtained the day prior to discharge.  The results are were negative.  He also had a CT of abdomen which was negative.  He reports dark stools about one week prior to this.  He endorses current crack cocaine as recent as two weeks ago.  Prior to that he also also seen by me in the office on 08/03/2013.  He was lost to laboratory follow up.  Hemolytic work up for his anemia was negative. On prior admission to hospital, his GI workup was negative at that time but FOBT was positive and he has been scheduled for capsule endoscopy with Dr. Henrene Pastor on 09/06/2013.  He had a second bone marrow biopsy as indicated below.  It revealed some atypical lymphoid aggregates but his cytogenetics were within normal limits.  Previously, he was lost-to-follow up in the clinic.    MEDICAL HISTORY: Past Medical History  Diagnosis Date  . Hypertension   . Anemia, chronic disease   . CHF (congestive heart failure)     diastolic.  EF 60 - 65% per [facility] eco 11/2011  . Cocaine abuse     mentioned in notes from [place]  . Hepatitis C antibody test positive     was HIV negative, 02/28/12  .  Hepatitis B core antibody positive     03/01/10  . Positive QuantiFERON-TB Gold test     11/2011  . Helicobacter pylori gastritis     not defined if this was treated  . Polyp of colon, adenomatous     May 2012.  Dr Trenton Founds in [place]  . Shortness of breath   . Hematochezia   . Head injury, closed, with concussion   . History of blood transfusion     "last one was 2 days ago" (11/09/2012)  . Type II diabetes mellitus     on oral pills only  . Arthritis     "right shoulder" (11/09/2012)  . Seizure disorder     questionable history of - will need to clarify with PCP  . Headache(784.0)     "q other day" (11/09/2012)  . ESRD on hemodialysis since 2012    Patient says he started HD in 2012.  ESRD was due to "drugs", primarily used cocaine.  Has 3-5 year hx of HTN, no DM.  Gets HD on TTS schedule at [place].  [name] from [place] and recently moved here.    . Hypercalcemia   . Hyperpotassemia   . Seizures     INTERIM HISTORY: has Hypertension; Diabetes mellitus without complication; ESRD on hemodialysis; Seizure disorder; Normocytic anemia; Cocaine abuse; Tobacco abuse; GI bleed; Hemorrhage of rectum and anus; Anemia; Heme + stool; Malnutrition of moderate degree; Microcytic anemia; and  Acute blood loss anemia on his problem list.    ALLERGIES:  is allergic to reglan.  MEDICATIONS: has a current medication list which includes the following prescription(s): calcium acetate, feeding supplement (resource breeze), hydralazine, levetiracetam, multivitamin, pantoprazole, and zolpidem.  SURGICAL HISTORY:  Past Surgical History  Procedure Laterality Date  . Shoulder open rotator cuff repair Right   . Total knee arthroplasty Left   . Bascilic vein transposition  03/07/2012    Procedure: BASCILIC VEIN TRANSPOSITION;  Surgeon: Conrad [place], MD;  Location: [facility];  Service: Vascular;  Laterality: Left;  First Stage  . Insertion of dialysis catheter      right chest  . Bascilic vein  transposition Left 05/31/2012    Procedure: BASCILIC VEIN TRANSPOSITION;  Surgeon: Conrad [place], MD;  Location: [facility];  Service: Vascular;  Laterality: Left;  Left 2nd Stage Basilic Vein Transposition with gortex graft revision using [id number] graft  . Shoulder open rotator cuff repair Right   . [name] capsule study N/A 08/27/2013    Procedure: [name] CAPSULE STUDY;  Surgeon: DMilus Banister MD;  [place] [facility]  Service: Endoscopy;  Laterality: N/A;   Family History  Problem Relation Age of Onset  . Diabetes Mother   . Hypertension Mother   . Stroke Mother   . Kidney failure Mother   . Cancer Father    REVIEW OF SYSTEMS:   Constitutional: Denies fevers, chills or abnormal weight loss Eyes: Denies blurriness of vision Ears, nose, mouth, throat, and face: Denies mucositis or sore throat Respiratory: Denies cough, dyspnea or wheezes Cardiovascular: Denies palpitation, chest discomfort or lower extremity swelling Gastrointestinal:  Denies nausea, heartburn or change in bowel habits Skin: Denies abnormal skin rashes Lymphatics: Denies new lymphadenopathy or easy bruising Neurological:Denies numbness, tingling or new weaknesses Behavioral/Psych: Mood is a bit down due to his co-morbidities as noted in HPI, no new changes  All other systems were reviewed with the patient and are negative.  PHYSICAL EXAMINATION: ECOG PERFORMANCE STATUS: 0 - Asymptomatic  Blood pressure 150/81, pulse 90, temperature 98.3 F (36.8 C), temperature source Oral, resp. rate 18, height 5' 9"  (1.753 m), weight 127 lb 6.4 oz (57.788 kg), SpO2 98.00%.  GENERAL:alert, no distress and comfortable; thin male who appears older than his stated age.  SKIN: skin color, texture, turgor are normal, no rashes or significant lesions EYES: normal, Conjunctiva are pink and non-injected, sclera clear OROPHARYNX:no exudate, no erythema and lips, buccal mucosa, and tongue normal; poor dentition w multiple dental caries.   NECK: supple, thyroid normal size, non-tender, without nodularity LYMPH:  no palpable lymphadenopathy in the cervical, axillary or supraclavicular LUNGS: clear to auscultation and percussion with normal breathing effort HEART: regular rate & rhythm and no murmurs and no lower extremity edema ABDOMEN:abdomen soft, non-tender and normal bowel sounds; no splenomegaly Musculoskeletal:no cyanosis of digits and no clubbing; Left upper extremity AV-fistula with good thrill. Chronic venous changes of lower extremities bilaterally.  NEURO: alert & oriented x 3 with fluent speech, no focal motor/sensory deficits  Labs:  Lab Results  Component Value Date   WBC 4.0 11/09/2013   HGB 14.1 11/09/2013   HCT 45.2 11/09/2013   MCV 89.4 11/09/2013   PLT 246 11/09/2013   NEUTROABS 2.2 11/09/2013      Chemistry      Component Value Date/Time   NA 141 11/09/2013 0931   NA 135* 08/27/2013 0550   K 4.6 11/09/2013 0931   K 5.1 08/27/2013 0550   CL 96  08/27/2013 0550   CO2 32* 11/09/2013 0931   CO2 23 08/27/2013 0550   BUN 28.5* 11/09/2013 0931   BUN 67* 08/27/2013 0550   CREATININE 6.6* 11/09/2013 0931   CREATININE 6.73* 08/27/2013 0550      Component Value Date/Time   CALCIUM 9.4 11/09/2013 0931   CALCIUM 9.1 08/27/2013 0550   ALKPHOS 74 11/09/2013 0931   ALKPHOS 41 08/22/2013 0650   AST 33 11/09/2013 0931   AST 18 08/22/2013 0650   ALT 22 11/09/2013 0931   ALT 10 08/22/2013 0650   BILITOT 0.59 11/09/2013 0931   BILITOT 0.7 08/22/2013 0650      RADIOGRAPHIC STUDIES: None  PATHOLOGY: NORMOCELLULAR BONE MARROW FOR AGE WITH ATYPICAL LYMPHOID AGGREGATES. - TRILINEAGE HEMATOPOIESIS. - SEE COMMENT PERIPHERAL BLOOD: - NORMOCYTIC-NORMOCHROMIC ANEMIA. Diagnosis Note The bone marrow shows several variably sized interstitial and paratrabecular lymphoid aggregates mostly composed of small lymphoid cells. Flow cytometric analysis failed to show any significant changes likely due to sampling. Immunohistochemical stains  performed on core biopsy show that the lymphoid aggregates are a mixture of T and B cells but with a significant B cell component. B cells in particular lack CD5, CD10, or cyclin D1 expression. The overall histologic and phenotypic features are worrisome for involvement by a B cell lymphoproliferative process. Full hematologic evaluation is recommended  FLOW CYTOMETRY: - PREDOMINANCE OF T CELLS WITH NO ABNORMAL PHENOTYPE. - MINOR B CELL POPULATION PRESENT. - SEE NOTE. Diagnosis Comment: There is a minor B cell population present (14% of lymphocytes) expressing pan B cell antigens but with extremely dim/negative staining for surface immunoglobulin light chains that hinder assessment and/or quantitation of clonality. However, an abnormal B cell phenotype such as co-expression of CD5 and CD20 or CD10 expression is not identified. Clinical correlation is recommended. (BNS:gt, 01/09/13) Joella Prince SMIR MD  REPEAT BONE MARROW BIOPSY ON 06/25/2013. Diagnosis Bone Marrow, Aspirate,Biopsy, and Clot, right iliac - NORMOCELLULAR BONE MARROW FOR AGE WITH TRILINEAGE HEMATOPOIESIS. - SEVERAL LYMPHOID AGGREGATES PRESENT. - SEE COMMENT. PERIPHERAL BLOOD: - NORMOCYTIC-NORMOCHROMIC ANEMIA. Diagnosis Note The overall morphologic and phenotypic features are similar to previous bone marrow ([id number]) and worrisome for minimal involvement by a B-cell lymphoproliferative process which cannot be excluded. As in the previous material, the changes are limited and not considered definitive or diagnostic of malignancy. Clinical correlation is recommended. Susanne Greenhouse MD Pathologist, Electronic Signature (Case signed 06/28/2013) SS AND MICROSCOPIC INFORMATION ASSESSMENT: [name] 58 y.o. male with a history of Normocytic anemia - Plan: CBC with Differential, CBC with Differential in 2 months, Basic metabolic panel (Bmet) - [facility]  ESRD on hemodialysis - Plan: Basic metabolic panel (Bmet) - [facility]  Diabetes mellitus  without complication  Tobacco abuse   PLAN:  1. Minor B cell population (17% of lymphocytes)  ----We reviewed his pathology with Dr. Gari Crown of pathology. 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 He does not have lymphadenopathy on exam. He does not have constitutional symptoms such as night sweats, fevers or weight lost.  Bone marrow overall morphologic and phenotypic features are similar to previous bone marrow ([id number]) and worrisome for minimal involvement by a B-cell lymphoproliferative process which cannot be excluded.  He can be reached at [phone].  --  His white blood cell count is 4.0 with normal plalets.  At this point, given his more active co-morbidities and history of non-compliance, it might be reasonable to actively observe closely for now.   2. Mixed anemia, improved.  --Likely secondary to his ESRDz.  His hemoglobin of  14.1 today.  He has a history of non-compliance with dialysis.  Continue present HD at [facility] on T/Thurs/Sat. He was on aranesp with dialysis, 200/week.   --follow CBC monthly to establish trend.   3. ESRDz on dialysis.    --As noted above, he will continue his dialysis.  He has a history of non-compliance.    4.  Polysubstance abuse including crack cocaine, tobacco abuse . --I discussed the importance of abstinence and offered assistance through social support programs, tobacco cessation abuse counseling and the patient declined smoking cessation.  5. Follow-up. --Patient instructed to follow up with [place] for repeat CBC, in 1 month. RTC in one month.   All questions were answered. The patient knows to call the clinic with any problems, questions or concerns. We can certainly see the patient much sooner if necessary. He was provided an after visit summary.   I spent 15 minutes counseling the patient face to face. The total time spent in the appointment was 25 minutes.    Della Homan, MD 11/09/2013 10:17 AM

## 2013-12-07 ENCOUNTER — Other Ambulatory Visit: Payer: Medicare Other

## 2013-12-12 ENCOUNTER — Encounter (HOSPITAL_COMMUNITY): Payer: Self-pay

## 2014-01-04 ENCOUNTER — Ambulatory Visit: Payer: Medicare Other

## 2014-01-04 ENCOUNTER — Other Ambulatory Visit: Payer: Medicare Other

## 2014-01-19 ENCOUNTER — Emergency Department (HOSPITAL_COMMUNITY): Payer: Medicare Other

## 2014-01-19 ENCOUNTER — Encounter (HOSPITAL_COMMUNITY): Payer: Self-pay | Admitting: Emergency Medicine

## 2014-01-19 ENCOUNTER — Inpatient Hospital Stay (HOSPITAL_COMMUNITY)
Admission: EM | Admit: 2014-01-19 | Discharge: 2014-01-29 | DRG: 123 | Disposition: A | Discharge status: Skilled Nursing Facility | Payer: Medicare Other | Attending: Internal Medicine | Admitting: Internal Medicine

## 2014-01-19 DIAGNOSIS — D638 Anemia in other chronic diseases classified elsewhere: Secondary | ICD-10-CM | POA: Diagnosis present

## 2014-01-19 DIAGNOSIS — G40909 Epilepsy, unspecified, not intractable, without status epilepticus: Secondary | ICD-10-CM | POA: Diagnosis present

## 2014-01-19 DIAGNOSIS — E43 Unspecified severe protein-calorie malnutrition: Secondary | ICD-10-CM | POA: Insufficient documentation

## 2014-01-19 DIAGNOSIS — Z992 Dependence on renal dialysis: Secondary | ICD-10-CM | POA: Diagnosis not present

## 2014-01-19 DIAGNOSIS — Z720 Tobacco use: Secondary | ICD-10-CM

## 2014-01-19 DIAGNOSIS — B1920 Unspecified viral hepatitis C without hepatic coma: Secondary | ICD-10-CM | POA: Diagnosis present

## 2014-01-19 DIAGNOSIS — H05012 Cellulitis of left orbit: Secondary | ICD-10-CM

## 2014-01-19 DIAGNOSIS — Z8249 Family history of ischemic heart disease and other diseases of the circulatory system: Secondary | ICD-10-CM | POA: Diagnosis not present

## 2014-01-19 DIAGNOSIS — H4902 Third [oculomotor] nerve palsy, left eye: Secondary | ICD-10-CM | POA: Diagnosis present

## 2014-01-19 DIAGNOSIS — H532 Diplopia: Secondary | ICD-10-CM | POA: Diagnosis present

## 2014-01-19 DIAGNOSIS — E1165 Type 2 diabetes mellitus with hyperglycemia: Secondary | ICD-10-CM | POA: Diagnosis present

## 2014-01-19 DIAGNOSIS — Z889 Allergy status to unspecified drugs, medicaments and biological substances status: Secondary | ICD-10-CM | POA: Diagnosis not present

## 2014-01-19 DIAGNOSIS — H5712 Ocular pain, left eye: Secondary | ICD-10-CM | POA: Diagnosis present

## 2014-01-19 DIAGNOSIS — N2581 Secondary hyperparathyroidism of renal origin: Secondary | ICD-10-CM | POA: Diagnosis present

## 2014-01-19 DIAGNOSIS — I447 Left bundle-branch block, unspecified: Secondary | ICD-10-CM | POA: Diagnosis present

## 2014-01-19 DIAGNOSIS — N186 End stage renal disease: Secondary | ICD-10-CM | POA: Diagnosis present

## 2014-01-19 DIAGNOSIS — Z96652 Presence of left artificial knee joint: Secondary | ICD-10-CM | POA: Diagnosis present

## 2014-01-19 DIAGNOSIS — R531 Weakness: Secondary | ICD-10-CM

## 2014-01-19 DIAGNOSIS — H4942 Progressive external ophthalmoplegia, left eye: Secondary | ICD-10-CM

## 2014-01-19 DIAGNOSIS — I5032 Chronic diastolic (congestive) heart failure: Secondary | ICD-10-CM | POA: Diagnosis present

## 2014-01-19 DIAGNOSIS — Z833 Family history of diabetes mellitus: Secondary | ICD-10-CM | POA: Diagnosis not present

## 2014-01-19 DIAGNOSIS — I10 Essential (primary) hypertension: Secondary | ICD-10-CM | POA: Diagnosis present

## 2014-01-19 DIAGNOSIS — F1410 Cocaine abuse, uncomplicated: Secondary | ICD-10-CM | POA: Diagnosis present

## 2014-01-19 DIAGNOSIS — Z681 Body mass index (BMI) 19 or less, adult: Secondary | ICD-10-CM

## 2014-01-19 DIAGNOSIS — E119 Type 2 diabetes mellitus without complications: Secondary | ICD-10-CM

## 2014-01-19 DIAGNOSIS — F17210 Nicotine dependence, cigarettes, uncomplicated: Secondary | ICD-10-CM | POA: Diagnosis present

## 2014-01-19 DIAGNOSIS — F1419 Cocaine abuse with unspecified cocaine-induced disorder: Secondary | ICD-10-CM

## 2014-01-19 DIAGNOSIS — I951 Orthostatic hypotension: Secondary | ICD-10-CM | POA: Diagnosis present

## 2014-01-19 DIAGNOSIS — H499 Unspecified paralytic strabismus: Secondary | ICD-10-CM

## 2014-01-19 DIAGNOSIS — Z823 Family history of stroke: Secondary | ICD-10-CM

## 2014-01-19 DIAGNOSIS — H469 Unspecified optic neuritis: Secondary | ICD-10-CM

## 2014-01-19 DIAGNOSIS — I120 Hypertensive chronic kidney disease with stage 5 chronic kidney disease or end stage renal disease: Secondary | ICD-10-CM | POA: Diagnosis present

## 2014-01-19 DIAGNOSIS — D649 Anemia, unspecified: Secondary | ICD-10-CM | POA: Diagnosis present

## 2014-01-19 LAB — SEDIMENTATION RATE: SED RATE: 30 mm/h — AB (ref 0–16)

## 2014-01-19 LAB — CBC WITH DIFFERENTIAL/PLATELET
Basophils Absolute: 0 10*3/uL (ref 0.0–0.1)
Basophils Relative: 1 % (ref 0–1)
EOS ABS: 0.1 10*3/uL (ref 0.0–0.7)
Eosinophils Relative: 3 % (ref 0–5)
HCT: 43.1 % (ref 39.0–52.0)
HEMOGLOBIN: 14.5 g/dL (ref 13.0–17.0)
LYMPHS PCT: 35 % (ref 12–46)
Lymphs Abs: 1 10*3/uL (ref 0.7–4.0)
MCH: 28.8 pg (ref 26.0–34.0)
MCHC: 33.6 g/dL (ref 30.0–36.0)
MCV: 85.7 fL (ref 78.0–100.0)
MONOS PCT: 9 % (ref 3–12)
Monocytes Absolute: 0.3 10*3/uL (ref 0.1–1.0)
Neutro Abs: 1.5 10*3/uL — ABNORMAL LOW (ref 1.7–7.7)
Neutrophils Relative %: 52 % (ref 43–77)
Platelets: 219 10*3/uL (ref 150–400)
RBC: 5.03 MIL/uL (ref 4.22–5.81)
RDW: 16.1 % — ABNORMAL HIGH (ref 11.5–15.5)
WBC: 2.9 10*3/uL — ABNORMAL LOW (ref 4.0–10.5)

## 2014-01-19 LAB — BASIC METABOLIC PANEL
Anion gap: 22 — ABNORMAL HIGH (ref 5–15)
BUN: 40 mg/dL — AB (ref 6–23)
CO2: 24 meq/L (ref 19–32)
Calcium: 8.8 mg/dL (ref 8.4–10.5)
Chloride: 89 mEq/L — ABNORMAL LOW (ref 96–112)
Creatinine, Ser: 9.52 mg/dL — ABNORMAL HIGH (ref 0.50–1.35)
GFR calc Af Amer: 6 mL/min — ABNORMAL LOW (ref >90)
GFR, EST NON AFRICAN AMERICAN: 5 mL/min — AB (ref >90)
GLUCOSE: 165 mg/dL — AB (ref 70–99)
Potassium: 3.9 mEq/L (ref 3.7–5.3)
Sodium: 135 mEq/L — ABNORMAL LOW (ref 137–147)

## 2014-01-19 LAB — TROPONIN I
Troponin I: <0.3 ng/mL (ref <0.30)
Troponin I: <0.3 ng/mL (ref <0.30)

## 2014-01-19 LAB — POC OCCULT BLOOD, ED: FECAL OCCULT BLD: NEGATIVE

## 2014-01-19 MED ORDER — DOXERCALCIFEROL 4 MCG/2ML IV SOLN
INTRAVENOUS | Status: AC
  Filled 2014-01-19: qty 2

## 2014-01-19 MED ORDER — PREDNISONE 20 MG PO TABS: 60.0000 mg | ORAL_TABLET | Freq: Once | ORAL | Status: DC

## 2014-01-19 MED ORDER — PENTAFLUOROPROP-TETRAFLUOROETH EX AERO: 1.0000 "application " | INHALATION_SPRAY | CUTANEOUS | Status: DC | PRN

## 2014-01-19 MED ORDER — CALCIUM ACETATE 667 MG PO CAPS
667.0000 mg | ORAL_CAPSULE | ORAL | Status: DC | PRN
  Filled 2014-01-19: qty 1

## 2014-01-19 MED ORDER — HYDRALAZINE HCL 100 MG PO TABS: 100.0000 mg | ORAL_TABLET | Freq: Two times a day (BID) | ORAL | Status: DC

## 2014-01-19 MED ORDER — DEXTROSE 5 % IV SOLN
2.0000 g | INTRAVENOUS | Status: DC
  Administered 2014-01-19 – 2014-01-21 (×3): 2 g via INTRAVENOUS
  Filled 2014-01-19 (×4): qty 2

## 2014-01-19 MED ORDER — SODIUM CHLORIDE 0.9 % IJ SOLN
3.0000 mL | Freq: Two times a day (BID) | INTRAMUSCULAR | Status: DC
  Administered 2014-01-27: 3 mL via INTRAVENOUS

## 2014-01-19 MED ORDER — FENTANYL CITRATE 0.05 MG/ML IJ SOLN
100.0000 ug | Freq: Once | INTRAMUSCULAR | Status: AC
  Administered 2014-01-19: 100 ug via INTRAVENOUS
  Filled 2014-01-19: qty 2

## 2014-01-19 MED ORDER — SODIUM CHLORIDE 0.9 % IV SOLN: 100.0000 mL | INTRAVENOUS | Status: DC | PRN

## 2014-01-19 MED ORDER — HEPARIN SODIUM (PORCINE) 5000 UNIT/ML IJ SOLN
5000.0000 [IU] | Freq: Three times a day (TID) | INTRAMUSCULAR | Status: DC
  Administered 2014-01-19 – 2014-01-29 (×25): 5000 [IU] via SUBCUTANEOUS
  Filled 2014-01-19 (×32): qty 1

## 2014-01-19 MED ORDER — METHYLPREDNISOLONE SODIUM SUCC 125 MG IJ SOLR
125.0000 mg | Freq: Once | INTRAMUSCULAR | Status: AC
  Administered 2014-01-19: 125 mg via INTRAVENOUS
  Filled 2014-01-19: qty 2

## 2014-01-19 MED ORDER — HEPARIN SODIUM (PORCINE) 1000 UNIT/ML DIALYSIS: 1000.0000 [IU] | INTRAMUSCULAR | Status: DC | PRN

## 2014-01-19 MED ORDER — ALTEPLASE 2 MG IJ SOLR
2.0000 mg | Freq: Once | INTRAMUSCULAR | Status: DC | PRN
  Filled 2014-01-19: qty 2

## 2014-01-19 MED ORDER — ACETAMINOPHEN 650 MG RE SUPP: 650.0000 mg | Freq: Four times a day (QID) | RECTAL | Status: DC | PRN

## 2014-01-19 MED ORDER — DIPHENHYDRAMINE HCL 25 MG PO CAPS
25.0000 mg | ORAL_CAPSULE | Freq: Every evening | ORAL | Status: DC | PRN
  Administered 2014-01-19 – 2014-01-26 (×4): 25 mg via ORAL
  Filled 2014-01-19 (×4): qty 1

## 2014-01-19 MED ORDER — FLUORESCEIN SODIUM 1 MG OP STRP
1.0000 | ORAL_STRIP | Freq: Once | OPHTHALMIC | Status: AC
  Administered 2014-01-19: 1 via OPHTHALMIC
  Filled 2014-01-19: qty 1

## 2014-01-19 MED ORDER — LEVETIRACETAM 500 MG PO TABS
1000.0000 mg | ORAL_TABLET | Freq: Every day | ORAL | Status: DC
  Administered 2014-01-19 – 2014-01-21 (×3): 1000 mg via ORAL
  Filled 2014-01-19 (×3): qty 2

## 2014-01-19 MED ORDER — SODIUM CHLORIDE 0.9 % IJ SOLN: 3.0000 mL | INTRAMUSCULAR | Status: DC | PRN

## 2014-01-19 MED ORDER — NEPRO/CARBSTEADY PO LIQD
237.0000 mL | ORAL | Status: DC | PRN
  Filled 2014-01-19: qty 237

## 2014-01-19 MED ORDER — CALCIUM ACETATE 667 MG PO CAPS
1334.0000 mg | ORAL_CAPSULE | Freq: Three times a day (TID) | ORAL | Status: DC
  Administered 2014-01-19 – 2014-01-28 (×26): 1334 mg via ORAL
  Filled 2014-01-19 (×33): qty 2

## 2014-01-19 MED ORDER — HYDRALAZINE HCL 50 MG PO TABS
100.0000 mg | ORAL_TABLET | Freq: Two times a day (BID) | ORAL | Status: DC
  Administered 2014-01-19 – 2014-01-24 (×11): 100 mg via ORAL
  Filled 2014-01-19 (×14): qty 2

## 2014-01-19 MED ORDER — SODIUM CHLORIDE 0.9 % IJ SOLN
3.0000 mL | Freq: Two times a day (BID) | INTRAMUSCULAR | Status: DC
  Administered 2014-01-19 – 2014-01-23 (×8): 3 mL via INTRAVENOUS
  Administered 2014-01-24: 10 mL via INTRAVENOUS
  Administered 2014-01-24 – 2014-01-29 (×9): 3 mL via INTRAVENOUS

## 2014-01-19 MED ORDER — DOXERCALCIFEROL 4 MCG/2ML IV SOLN
2.0000 ug | INTRAVENOUS | Status: DC
  Administered 2014-01-19 – 2014-01-29 (×5): 2 ug via INTRAVENOUS
  Filled 2014-01-19 (×4): qty 2

## 2014-01-19 MED ORDER — SODIUM CHLORIDE 0.9 % IV SOLN: 250.0000 mL | INTRAVENOUS | Status: DC | PRN

## 2014-01-19 MED ORDER — VANCOMYCIN HCL 10 G IV SOLR
1250.0000 mg | Freq: Once | INTRAVENOUS | Status: AC
  Administered 2014-01-19: 1250 mg via INTRAVENOUS
  Filled 2014-01-19: qty 1250

## 2014-01-19 MED ORDER — PANTOPRAZOLE SODIUM 40 MG PO TBEC
40.0000 mg | DELAYED_RELEASE_TABLET | Freq: Every day | ORAL | Status: DC
  Administered 2014-01-19 – 2014-01-29 (×11): 40 mg via ORAL
  Filled 2014-01-19 (×10): qty 1

## 2014-01-19 MED ORDER — HYDROCODONE-ACETAMINOPHEN 5-325 MG PO TABS
1.0000 | ORAL_TABLET | Freq: Four times a day (QID) | ORAL | Status: DC | PRN
  Administered 2014-01-19 (×2): 2 via ORAL
  Filled 2014-01-19: qty 2

## 2014-01-19 MED ORDER — VANCOMYCIN HCL 500 MG IV SOLR
500.0000 mg | INTRAVENOUS | Status: DC
  Administered 2014-01-22: 500 mg via INTRAVENOUS
  Filled 2014-01-19 (×2): qty 500

## 2014-01-19 MED ORDER — HYDROCODONE-ACETAMINOPHEN 5-325 MG PO TABS
1.0000 | ORAL_TABLET | ORAL | Status: DC | PRN
  Administered 2014-01-20 – 2014-01-21 (×8): 2 via ORAL
  Administered 2014-01-22: 1 via ORAL
  Administered 2014-01-22: 2 via ORAL
  Administered 2014-01-22: 1 via ORAL
  Administered 2014-01-22: 2 via ORAL
  Administered 2014-01-23: 1 via ORAL
  Administered 2014-01-23 – 2014-01-29 (×23): 2 via ORAL
  Administered 2014-01-29: 1 via ORAL
  Filled 2014-01-19 (×22): qty 2
  Filled 2014-01-19: qty 1
  Filled 2014-01-19 (×14): qty 2

## 2014-01-19 MED ORDER — TETRACAINE HCL 0.5 % OP SOLN
2.0000 [drp] | Freq: Once | OPHTHALMIC | Status: AC
  Administered 2014-01-19: 2 [drp] via OPHTHALMIC
  Filled 2014-01-19: qty 2

## 2014-01-19 MED ORDER — HYDROCODONE-ACETAMINOPHEN 5-325 MG PO TABS
ORAL_TABLET | ORAL | Status: AC
  Filled 2014-01-19: qty 2

## 2014-01-19 MED ORDER — LIDOCAINE-PRILOCAINE 2.5-2.5 % EX CREA
1.0000 "application " | TOPICAL_CREAM | CUTANEOUS | Status: DC | PRN
  Filled 2014-01-19: qty 5

## 2014-01-19 MED ORDER — LIDOCAINE HCL (PF) 1 % IJ SOLN: 5.0000 mL | INTRAMUSCULAR | Status: DC | PRN

## 2014-01-19 MED ORDER — ACETAMINOPHEN 325 MG PO TABS: 650.0000 mg | ORAL_TABLET | Freq: Four times a day (QID) | ORAL | Status: DC | PRN

## 2014-01-19 NOTE — ED Notes (Signed)
Pt taken off bedpan and cleaned; Santiago Glad, RN obtained a stool sample

## 2014-01-19 NOTE — ED Notes (Signed)
Patient transported to CT 

## 2014-01-19 NOTE — H&P (Signed)
Date: 01/19/2014               Patient Name:  [name] MRN: [id number]  DOB: 03-Sep-1955 Age / Sex: 58 y.o., male   PCP: Windy Kalata, MD         Medical Service: Internal Medicine Teaching Service         Attending Physician: Dr. Michel Bickers, MD    First Contact: Dr. Josiah Lobo, MS4  Pager: [phone]  Second Contact: Dr. Dr. Duwaine Maxin  Pager: [phone]       After Hours (After 5p/  First Contact Pager: [phone]  weekends / holidays): Second Contact Pager: [phone]   HPI: [name] is a 58 year old African American male with a history of hypertension, ESRD (TTS dialysis), and prior GI bleeds who presents with L eye pain and associated discharge and swelling as well as a L-sided headache. The patient says that the pain in his left eye started three days upon waking up in the morning and describes it as "throbbing." The pain has remained since it started. Moving his left eye in all directions is painful. The patient says that he has noticed "sticky" discharge from his left eye but cannot recall the appearance and that it has been tough to open his eyelids in the morning. He also reports gradual vision loss and diplopia since the onset of the pain. Due to the difficulty seeing, patient said that he has not been able to cook food for the past three days. He denies jaw claudication or ear pain. He denies feeling a foreign object in his eye, does not wear contact lenses or glasses though he says that he should get glasses because his general vision has been declining recently. He denies dizziness but reports some tingling and numbness in his four extremities that has been going on for a while. He has never had these symptoms before.  He was in dialysis this morning and the nurse had observed the difference in his L eye and told him to present to the ED.  Patient had breakfast from [employer] this morning and had an episode of emesis and vomiting after arriving to the ED. He  denies any abdominal pain and had normal BMs prior to this episode.  In the ED, patient had an measured IOP of 10-20 mmHg, fluorscein exam was negative for any abrasion, and CT head w/o contrast was negative for any acute intracranial process. He received a dose of 125 mg methylprednisolone due to concern for giant cell arteritis.    Meds: Current Facility-Administered Medications  Medication Dose Route Frequency Provider Last Rate Last Dose  . 0.9 %  sodium chloride infusion  250 mL Intravenous PRN Francesca Oman, DO      . 0.9 %  sodium chloride infusion  100 mL Intravenous PRN Ramiro Harvest, PA-C      . 0.9 %  sodium chloride infusion  100 mL Intravenous PRN Ramiro Harvest, PA-C      . acetaminophen (TYLENOL) tablet 650 mg  650 mg Oral Q6H PRN Francesca Oman, DO       Or  . acetaminophen (TYLENOL) suppository 650 mg  650 mg Rectal Q6H PRN Francesca Oman, DO      . alteplase (CATHFLO ACTIVASE) injection 2 mg  2 mg Intracatheter Once PRN Ramiro Harvest, PA-C      . calcium acetate (PHOSLO) capsule 1,334 mg  1,334 mg Oral TID WC Francesca Oman, DO      .  calcium acetate (PHOSLO) capsule 667 mg  667 mg Oral PRN Michel Bickers, MD      . cefTRIAXone (ROCEPHIN) 2 g in dextrose 5 % 50 mL IVPB  2 g Intravenous Q24H Michel Bickers, MD      . doxercalciferol (HECTOROL) injection 2 mcg  2 mcg Intravenous Q T,Th,Sa-HD Ramiro Harvest, PA-C      . feeding supplement (NEPRO CARB STEADY) liquid 237 mL  237 mL Oral PRN Ramiro Harvest, PA-C      . heparin injection 1,000 Units  1,000 Units Dialysis PRN Ramiro Harvest, PA-C      . heparin injection 5,000 Units  5,000 Units Subcutaneous 3 times per day Francesca Oman, DO      . hydrALAZINE (APRESOLINE) tablet 100 mg  100 mg Oral BID Michel Bickers, MD      . HYDROcodone-acetaminophen (NORCO/VICODIN) 5-325 MG per tablet 1-2 tablet  1-2 tablet Oral Q6H PRN Francesca Oman, DO   2 tablet at 01/19/14 1449  . levETIRAcetam (KEPPRA) tablet 1,000 mg  1,000 mg Oral Daily Francesca Oman, DO      . lidocaine (PF) (XYLOCAINE) 1 % injection 5 mL  5 mL Intradermal PRN Ramiro Harvest, PA-C      . lidocaine-prilocaine (EMLA) cream 1 application  1 application Topical PRN Ramiro Harvest, PA-C      . pantoprazole (PROTONIX) EC tablet 40 mg  40 mg Oral Daily Francesca Oman, DO      . pentafluoroprop-tetrafluoroeth (GEBAUERS) aerosol 1 application  1 application Topical PRN Ramiro Harvest, PA-C      . sodium chloride 0.9 % injection 3 mL  3 mL Intravenous Q12H Francesca Oman, DO      . sodium chloride 0.9 % injection 3 mL  3 mL Intravenous Q12H Francesca Oman, DO      . sodium chloride 0.9 % injection 3 mL  3 mL Intravenous PRN Francesca Oman, DO      . vancomycin (VANCOCIN) 1,250 mg in sodium chloride 0.9 % 250 mL IVPB  1,250 mg Intravenous Once Michel Bickers, MD      . Derrill Memo ON 01/22/2014] vancomycin (VANCOCIN) 500 mg in sodium chloride 0.9 % 100 mL IVPB  500 mg Intravenous Q T,Th,Sa-HD Michel Bickers, MD        Allergies: Allergies as of 01/19/2014 - Review Complete 01/19/2014  Allergen Reaction Noted  . Reglan [metoclopramide] Other (See Comments) 03/02/2012   Past Medical History  Diagnosis Date  . Hypertension   . Anemia, chronic disease   . CHF (congestive heart failure)     diastolic.  EF 60 - 65% per [facility] eco 11/2011  . Cocaine abuse     mentioned in notes from [place]  . Hepatitis C antibody test positive     was HIV negative, 02/28/12  . Hepatitis B core antibody positive     03/01/10  . Positive QuantiFERON-TB Gold test     11/2011  . Helicobacter pylori gastritis     not defined if this was treated  . Polyp of colon, adenomatous     May 2012.  Dr Trenton Founds in [place]  . Shortness of breath   . Hematochezia   . Head injury, closed, with concussion   . History of blood transfusion     "last one was 2 days ago" (11/09/2012)  . Type II diabetes mellitus     on oral pills only  . Arthritis     "right shoulder" (11/09/2012)  .  Seizure disorder      questionable history of - will need to clarify with PCP  . Headache(784.0)     "q other day" (11/09/2012)  . ESRD on hemodialysis since 2012    Patient says he started HD in 2012.  ESRD was due to "drugs", primarily used cocaine.  Has 3-5 year hx of HTN, no DM.  Gets HD on TTS schedule at [place].  [name] from [place] and recently moved here.    . Hypercalcemia   . Hyperpotassemia   . Seizures    Past Surgical History  Procedure Laterality Date  . Shoulder open rotator cuff repair Right   . Total knee arthroplasty Left   . Bascilic vein transposition  03/07/2012    Procedure: BASCILIC VEIN TRANSPOSITION;  Surgeon: Conrad Cheatham, MD;  Location: [facility];  Service: Vascular;  Laterality: Left;  First Stage  . Insertion of dialysis catheter      right chest  . Bascilic vein transposition Left 05/31/2012    Procedure: BASCILIC VEIN TRANSPOSITION;  Surgeon: Conrad Summerfield, MD;  Location: [facility];  Service: Vascular;  Laterality: Left;  Left 2nd Stage Basilic Vein Transposition with gortex graft revision using 28mx10cm graft  . Shoulder open rotator cuff repair Right   . [name] capsule study N/A 08/27/2013    Procedure: [name] CAPSULE STUDY;  Surgeon: DMilus Banister MD;  Location[place] [facility]  Service: Endoscopy;  Laterality: N/A;   Family History  Problem Relation Age of Onset  . Diabetes Mother   . Hypertension Mother   . Stroke Mother   . Kidney failure Mother   . Cancer Father    History   Social History  . Marital Status: Single    Spouse Name: N/A    Number of Children: 1  . Years of Education: N/A   Occupational History  . Unemployed    Social History Main Topics  . Smoking status: Current Every Day Smoker -- 0.25 packs/day for 25 years    Types: Cigarettes  . Smokeless tobacco: Never Used  . Alcohol Use: No     Comment: 11/09/2012 "been stopped drinking 1-2 yr ago"  . Drug Use: Yes    Special: "Crack" cocaine, Marijuana     Comment: 11/09/2012 "used weed > 20 yr ago;  smokes crack-cocaine- last time ~ 5 months ago"  . Sexual Activity: Yes   Other Topics Concern  . Not on file   Social History Narrative  . No narrative on file    Review of Systems: Pertinent items are noted in HPI.  Physical Exam: Blood pressure 196/99, pulse 74, temperature 97.8 F (36.6 C), temperature source Oral, resp. rate 18, height _0  (1.753 m), weight 57.4 kg (126 lb 8.7 oz), SpO2 95.00%. General: resting in bed in NAD, awake and cooperative Eyes:  L eye with some swelling compared to R. Conjunctiva of L eye slightly more injected compared to R eye. R pupil is sluggishly reactive. EOM is limited on L abduction, adduction, elevation, and depression. L pupil looks "down and out" on general appearance. Horizontal diplopia as patient says seeing 2 fingers when examiner held 1; 4 when 2 was held; 6 when 3 was held. R eye is reactive and EOM is intact. Eye chart exam is limited; 20/40 on both eyes.  Cardiac: RRR, no rubs, murmurs or gallops, no carotid bruits, intact peripheral pulses Pulm: clear to auscultation bilaterally, moving normal volumes of air Abd: soft, nontender, nondistended, BS present  Ext: warm and well perfused, no pedal edema, very dry skin of legs and feet Neuro: alert and oriented X3, cranial nerves II-XII grossly intact other than limitations noted on eye exam   Lab results: Basic Metabolic Panel:  Recent Labs  01/19/14 0756  NA 135*  K 3.9  CL 89*  CO2 24  GLUCOSE 165*  BUN 40*  CREATININE 9.52*  CALCIUM 8.8   CBC:  Recent Labs  01/19/14 0756  WBC 2.9*  NEUTROABS 1.5*  HGB 14.5  HCT 43.1  MCV 85.7  PLT 219   Cardiac Enzymes:  Recent Labs  01/19/14 0756 01/19/14 1310  TROPONINI <0.30 <0.30  Urine Drug Screen: Drugs of Abuse     Component Value Date/Time   LABOPIA NONE DETECTED 08/24/2013 2115   COCAINSCRNUR POSITIVE* 08/24/2013 2115   LABBENZ NONE DETECTED 08/24/2013 2115   AMPHETMU NONE DETECTED 08/24/2013 2115   THCU NONE  DETECTED 08/24/2013 2115   LABBARB NONE DETECTED 08/24/2013 2115     Imaging results:  Ct Head Wo Contrast  01/19/2014   CLINICAL DATA:  Acute headache and the left sided blurry vision. Lethargy. Initial encounter.  EXAM: CT HEAD WITHOUT CONTRAST  TECHNIQUE: Contiguous axial images were obtained from the base of the skull through the vertex without intravenous contrast.  COMPARISON:  None.  FINDINGS: Scattered periventricular hypodensities, most conspicuous about the anterior horn of the right lateral ventricle (image 19, series 201), compatible with microvascular ischemic disease. Gray-white differentiation is otherwise well maintained without CT evidence of acute large territory infarct. No intraparenchymal or extra-axial mass or hemorrhage. Normal size a configuration of the ventricles and basilar cisterns. No midline shift. Limited visualization of the paranasal sinuses and mastoid air cells is normal. Intracranial atherosclerosis. Regional soft tissues are normal. No displaced calvarial fracture.  IMPRESSION: Mild microvascular ischemic disease without acute intracranial process.   Electronically Signed   By: Sandi Mariscal M.D.   On: 01/19/2014 10:28   Dg Chest Portable 1 View  01/19/2014   CLINICAL DATA:  Dyspnea for 2 days. History of hypertension, diabetes and smoking. Initial encounter.  EXAM: PORTABLE CHEST - 1 VIEW  COMPARISON:  01/02/2013; 12/19/2012; 05/06/2012; 03/02/2012  FINDINGS: Grossly unchanged enlarged cardiac silhouette. Overall improved aeration of the lungs with minimal residual right mid lung heterogeneous opacities, stable since the 02/2012 examination and favored to represent atelectasis or scar. No new focal airspace opacities. No pleural effusion pneumothorax. No evidence of edema. Postsurgical change of the right glenohumeral joint.  IMPRESSION: 1.  No acute cardiopulmonary disease. 2. Improved aerated of lungs with persistent right mid lung atelectasis / scar.   Electronically  Signed   By: Sandi Mariscal M.D.   On: 01/19/2014 08:12   EKG:  Rate 87 bpm, prolonged QT, otherwise normal intervals, LVH,  New TWI - leads II, III, aVF, V4-V6  Assessment and Plan: Painful ophthalmoplegia of left eye: Differential includes GCA, orbital cellulitis, acute-angle glaucoma. Eye pain and left sided temporal pain are concerning for GCA, however ESR only mildly elevated and patient less than 60 without PMR symptoms making this less likely.  One can consider recent cocaine abuse leading to vasoconstriction of retinal arteries but this is unlikely given no monocular loss of vision. Acute-angle glaucoma is not likely at this time given his IOP at the ED were <20, no scleral injection, and no dilated pupil noted on exam. It is still not possible to exclude a neurological pathologic process at this moment and we may need further imaging  to fully delineate his clinical picture. Given the high concern for orbital cellulitis, we will start empiric antibiotic treatment. We have requested urgent ophthalmologic evaluation given the acuity and severity of symptoms.  --consulted Dr. Posey Pronto at opthalmology, will f/u on their recs  --f/u with CT imaging results with radiology  --start vancomycin and ceftriaxone  --consider further imaging such as MRI A/V or CT A/V for cavernous venous thrombosis  --hold steroids given less concern for GCA  --Tylenol PRN pain   New t-wave inversions inferior-lateral leads:  New compared to prior EKGs.  poc troponin negative and he denies chest pain making ACS less likely. This could be related to LVH.   - cardiac monitoring - AM EKG - consider right ventricular lead placement, card consult  Hypertension: elevated on admission at 208/162 and remains in the 241Z-530Z systolic but as per previous records, patient's systolic runs in the 040E to 170s  --continue home hydralazine   Diabetes Mellitus:   Hgb A1c 5.0 July 2014.  Serum BG 165.  [name] not currently on treatment.     - check A1c, monitor CBG, no need for trx currently   ESRD on hemodialysis, TThS schedule: Patient missed his Thursday session but went 10/3 morning, where he was directed to ED. Patient completed his dialysis session inpatient.  --renal consulted, appreciate recs  --doxercalciferol TTS   Cocaine abuse: patient has a long history of cocaine abuse with recent use being about three days ago.  --counseled on cessation   Tobacco abuse:  --counseled on cessation  --nicotine patch PRN   Seizure disorder:  --continue home Keppra   VTE ppx:  heparin Diet: renal diet with 1200 mL fluid restriction   Dispo: Disposition is deferred at this time, awaiting improvement of current medical problems. Anticipated discharge in approximately 1-2 day(s).   The patient does have a current PCP Windy Kalata, MD) and does need an [facility] hospital follow-up appointment after discharge.  The patient does not know have transportation limitations that hinder transportation to clinic appointments.  Signed: Francesca Oman, DO 01/19/2014, 3:40 PM

## 2014-01-19 NOTE — ED Notes (Signed)
Report given to dialysis-- and 6 [place].

## 2014-01-19 NOTE — H&P (Signed)
  I have seen and examined the patient myself, and I have reviewed the note by Josiah Lobo, MS4 and was present during the interview and physical exam.  Please see my separate H&P for additional findings, assessment, and plan.   Signed: Francesca Oman, DO 01/19/2014, 9:08 PM

## 2014-01-19 NOTE — ED Notes (Signed)
Admitting dr at bedside.  

## 2014-01-19 NOTE — ED Provider Notes (Signed)
CSN: [id number]     Arrival date & time 01/19/14  0710 History   First MD Initiated Contact with Patient 01/19/14 [phone]     Chief Complaint  Patient presents with  . Eye Problem  . Diarrhea  . Weakness     (Consider location/radiation/quality/duration/timing/severity/associated sxs/prior Treatment) HPI 58 year old male presents with a chief complaint of left eye pain and blurry vision for the last 3 days. Has also been having drainage out of his left eye. Feels pain around the eye. His right eye is normal. [name] unsure what color the drainage is. Has an associated left sided headache with it. Went to dialysis and they sent him here instead. He has not had his dialysis today. Last dialysis was 2 days ago. Has also been complaining of weakness over the last couple days as well as he started having nausea and vomiting yesterday. He endorses dizziness as well. He started having diarrhea on arrival to the ER. Denies blood in his stools. Denies abdominal pain. States feels somewhat short of breath. Denies chest pain.  Past Medical History  Diagnosis Date  . Hypertension   . Anemia, chronic disease   . CHF (congestive heart failure)     diastolic.  EF 60 - 65% per [facility] eco 11/2011  . Cocaine abuse     mentioned in notes from [place]  . Hepatitis C antibody test positive     was HIV negative, 02/28/12  . Hepatitis B core antibody positive     03/01/10  . Positive QuantiFERON-TB Gold test     11/2011  . Helicobacter pylori gastritis     not defined if this was treated  . Polyp of colon, adenomatous     May 2012.  Dr Trenton Founds in [place]  . Shortness of breath   . Hematochezia   . Head injury, closed, with concussion   . History of blood transfusion     "last one was 2 days ago" (11/09/2012)  . Type II diabetes mellitus     on oral pills only  . Arthritis     "right shoulder" (11/09/2012)  . Seizure disorder     questionable history of - will need to clarify with PCP  .  Headache(784.0)     "q other day" (11/09/2012)  . ESRD on hemodialysis since 2012    Patient says he started HD in 2012.  ESRD was due to "drugs", primarily used cocaine.  Has 3-5 year hx of HTN, no DM.  Gets HD on TTS schedule at [place].  [name] from [place] and recently moved here.    . Hypercalcemia   . Hyperpotassemia   . Seizures    Past Surgical History  Procedure Laterality Date  . Shoulder open rotator cuff repair Right   . Total knee arthroplasty Left   . Bascilic vein transposition  03/07/2012    Procedure: BASCILIC VEIN TRANSPOSITION;  Surgeon: Conrad Sanford, MD;  Location: [place];  Service: Vascular;  Laterality: Left;  First Stage  . Insertion of dialysis catheter      right chest  . Bascilic vein transposition Left 05/31/2012    Procedure: BASCILIC VEIN TRANSPOSITION;  Surgeon: Conrad Copeland, MD;  Location: [place];  Service: Vascular;  Laterality: Left;  Left 2nd Stage Basilic Vein Transposition with gortex graft revision using 23mx10cm graft  . Shoulder open rotator cuff repair Right   . [name] capsule study N/A 08/27/2013    Procedure: [name] CAPSULE STUDY;  Surgeon:  Milus Banister, MD;  Location: [place];  Service: Endoscopy;  Laterality: N/A;   Family History  Problem Relation Age of Onset  . Diabetes Mother   . Hypertension Mother   . Stroke Mother   . Kidney failure Mother   . Cancer Father    History  Substance Use Topics  . Smoking status: Current Every Day Smoker -- 0.25 packs/day for 25 years    Types: Cigarettes  . Smokeless tobacco: Never Used  . Alcohol Use: No     Comment: 11/09/2012 "been stopped drinking 1-2 yr ago"    Review of Systems  Constitutional: Positive for fatigue. Negative for fever.  Eyes: Positive for pain, discharge, redness and visual disturbance.  Respiratory: Positive for shortness of breath.   Cardiovascular: Negative for chest pain.  Gastrointestinal: Positive for nausea, vomiting and diarrhea. Negative for abdominal  pain.  Neurological: Positive for weakness.  All other systems reviewed and are negative.     Allergies  Reglan  Home Medications   Prior to Admission medications   Medication Sig Start Date End Date Taking? Authorizing Provider  calcium acetate (PHOSLO) 667 MG capsule Take 667-2,001 mg by mouth See admin instructions. Take 2,023m three times a day with meals and take 6610ma day with snacks    Historical Provider, MD  feeding supplement, RESOURCE BREEZE, (RESOURCE BREEZE) LIQD Take 1 Container by mouth 2 (two) times daily between meals. 08/27/13   Maryann Mikhail, DO  hydrALAZINE (APRESOLINE) 100 MG tablet Take 100 mg by mouth See admin instructions. Take 100 mg twice a day on dialysis days and take 100 mg four times a day on non dialysis days    Historical Provider, MD  levETIRAcetam (KEPPRA) 1000 MG tablet Take 1,000 mg by mouth daily.     Historical Provider, MD  multivitamin (RENA-VIT) TABS tablet Take 1 tablet by mouth at bedtime. 08/24/13   Maryann Mikhail, DO  pantoprazole (PROTONIX) 40 MG tablet Take 1 tablet (40 mg total) by mouth daily at 6 (six) AM. 08/24/13   Maryann Mikhail, DO  zolpidem (AMBIEN) 5 MG tablet Take 5 mg by mouth at bedtime.    Historical Provider, MD   BP 195/165  Pulse 92  Temp(Src) 97.6 F (36.4 C) (Oral)  Resp 13  Ht 5' 9"  (1.753 m)  Wt 126 lb 12.2 oz (57.499 kg)  BMI 18.71 kg/m2  SpO2 100% Physical Exam  Nursing note and vitals reviewed. Constitutional: [name] oriented to person, place, and time. He appears well-developed and well-nourished.  HENT:  Head: Normocephalic and atraumatic.    Right Ear: External ear normal.  Left Ear: External ear normal.  Nose: Nose normal.  Eyes: Pupils are equal, round, and reactive to light. Right eye exhibits no discharge. Left eye exhibits no discharge. No foreign body present in the left eye. Left conjunctiva is injected (mild).  Slit lamp exam:      The right eye shows no fluorescein uptake.       The left  eye shows no corneal abrasion.  Mild clear drainage out of left eye. IOP between 14-20 in both eyes Right eye EOM normal. Left eye does not fully move to the right but does seem to cross midline.  Neck: Neck supple.  Cardiovascular: Normal rate, regular rhythm, normal heart sounds and intact distal pulses.   Pulmonary/Chest: Effort normal and breath sounds normal.  Abdominal: Soft. He exhibits no distension. There is no tenderness.  Musculoskeletal: He exhibits no edema.  Neurological: He  is alert and oriented to person, place, and time.  Skin: Skin is warm and dry.    ED Course  Procedures (including critical care time) Labs Review Labs Reviewed  CBC WITH DIFFERENTIAL - Abnormal; Notable for the following:    WBC 2.9 (*)    RDW 16.1 (*)    Neutro Abs 1.5 (*)    All other components within normal limits  BASIC METABOLIC PANEL - Abnormal; Notable for the following:    Sodium 135 (*)    Chloride 89 (*)    Glucose, Bld 165 (*)    BUN 40 (*)    Creatinine, Ser 9.52 (*)    GFR calc non Af Amer 5 (*)    GFR calc Af Amer 6 (*)    Anion gap 22 (*)    All other components within normal limits  SEDIMENTATION RATE - Abnormal; Notable for the following:    Sed Rate 30 (*)    All other components within normal limits  TROPONIN I  URINALYSIS, ROUTINE W REFLEX MICROSCOPIC  POC OCCULT BLOOD, ED    Imaging Review Ct Head Wo Contrast  01/19/2014   CLINICAL DATA:  Acute headache and the left sided blurry vision. Lethargy. Initial encounter.  EXAM: CT HEAD WITHOUT CONTRAST  TECHNIQUE: Contiguous axial images were obtained from the base of the skull through the vertex without intravenous contrast.  COMPARISON:  None.  FINDINGS: Scattered periventricular hypodensities, most conspicuous about the anterior horn of the right lateral ventricle (image 19, series 201), compatible with microvascular ischemic disease. Gray-white differentiation is otherwise well maintained without CT evidence of acute  large territory infarct. No intraparenchymal or extra-axial mass or hemorrhage. Normal size a configuration of the ventricles and basilar cisterns. No midline shift. Limited visualization of the paranasal sinuses and mastoid air cells is normal. Intracranial atherosclerosis. Regional soft tissues are normal. No displaced calvarial fracture.  IMPRESSION: Mild microvascular ischemic disease without acute intracranial process.   Electronically Signed   By: Sandi Mariscal M.D.   On: 01/19/2014 10:28   Dg Chest Portable 1 View  01/19/2014   CLINICAL DATA:  Dyspnea for 2 days. History of hypertension, diabetes and smoking. Initial encounter.  EXAM: PORTABLE CHEST - 1 VIEW  COMPARISON:  01/02/2013; 12/19/2012; 05/06/2012; 03/02/2012  FINDINGS: Grossly unchanged enlarged cardiac silhouette. Overall improved aeration of the lungs with minimal residual right mid lung heterogeneous opacities, stable since the 02/2012 examination and favored to represent atelectasis or scar. No new focal airspace opacities. No pleural effusion pneumothorax. No evidence of edema. Postsurgical change of the right glenohumeral joint.  IMPRESSION: 1.  No acute cardiopulmonary disease. 2. Improved aerated of lungs with persistent right mid lung atelectasis / scar.   Electronically Signed   By: Sandi Mariscal M.D.   On: 01/19/2014 08:12     EKG Interpretation   Date/Time:  Saturday January 19 2014 07:20:40 EDT Ventricular Rate:  87 PR Interval:  180 QRS Duration: 101 QT Interval:  432 QTC Calculation: 520 R Axis:   82 Text Interpretation:  Sinus rhythm Biatrial enlargement LVH with secondary  repolarization abnormality Prolonged QT interval T waves diffusely  inverted compared to March 2015 Confirmed by Regenia Skeeter  MD, [place] ([id number]) on  01/19/2014 7:35:56 AM      MDM   Final diagnoses:  Weakness    Patient's eye exam shows no obvious etiology for his left eye pain and left headache. He has a mild elevated ESR, and given no other  obvious pathology we'll  treat as possible temporal arteritis. Given IV steroids in the ED. He also has multiple other issues, including his fatigue, lightheadedness, shortness of breath with new T-wave inversions. This in the setting of a left bundle branch block. He has no chest pain and does not appear ill at this time. Will need serial troponins given a shortness of breath. Will admit to the internal medicine teaching service.    Ephraim Hamburger, MD 01/19/14 [phone]

## 2014-01-19 NOTE — Consult Note (Signed)
[facility] Renal Consultation Note  Indication for Consultation:  Management of ESRD/hemodialysis; anemia, hypertension/volume and secondary hyperparathyroidism  HPI: [name] is a 58 y.o. male with a history of hypertension, Diabetes Type 2, Hepatitis C, cocaine abuse, and ESRD on dialysis at the [facility] who presented for treatment today with three days of left eye pain, swelling, blurred vision, and discharge, worsening left-side headache and lethargy, and nausea and vomiting since yesterday.  He was sent to the ER where CT of the head showed mild microvascular ischemic disease, but no acute process, and chest x-ray was negative for edema or acute disease.  He has hardly eaten since onset of symptoms, and in addition to his nausea and vomiting also had one episode of diarrhea since arrival.  He will be admitted and will require his scheduled dialysis today.    Dialysis Orders:  TTS @ AF 58.5 kg     4 hrs       2K/2.25Ca     400/A1.5       No Heparin        AVF @ LUA Hectorol 2 mcg          Aranesp 80 mcg and Venofer 50 mg on Thurs.  Past Medical History  Diagnosis Date  . Hypertension   . Anemia, chronic disease   . CHF (congestive heart failure)     diastolic.  EF 60 - 65% per [facility] eco 11/2011  . Cocaine abuse     mentioned in notes from [place]  . Hepatitis C antibody test positive     was HIV negative, 02/28/12  . Hepatitis B core antibody positive     03/01/10  . Positive QuantiFERON-TB Gold test     11/2011  . Helicobacter pylori gastritis     not defined if this was treated  . Polyp of colon, adenomatous     May 2012.  Dr Trenton Founds in [place]  . Shortness of breath   . Hematochezia   . Head injury, closed, with concussion   . History of blood transfusion     "last one was 2 days ago" (11/09/2012)  . Type II diabetes mellitus     on oral pills only  . Arthritis     "right shoulder" (11/09/2012)  . Seizure disorder      questionable history of - will need to clarify with PCP  . Headache(784.0)     "q other day" (11/09/2012)  . ESRD on hemodialysis since 2012    Patient says he started HD in 2012.  ESRD was due to "drugs", primarily used cocaine.  Has 3-5 year hx of HTN, no DM.  Gets HD on TTS schedule at [place].  [name] from [place] and recently moved here.    . Hypercalcemia   . Hyperpotassemia   . Seizures    Past Surgical History  Procedure Laterality Date  . Shoulder open rotator cuff repair Right   . Total knee arthroplasty Left   . Bascilic vein transposition  03/07/2012    Procedure: BASCILIC VEIN TRANSPOSITION;  Surgeon: Conrad Middleton, MD;  Location: [place];  Service: Vascular;  Laterality: Left;  First Stage  . Insertion of dialysis catheter      right chest  . Bascilic vein transposition Left 05/31/2012    Procedure: BASCILIC VEIN TRANSPOSITION;  Surgeon: Conrad Waverly, MD;  Location: [place];  Service: Vascular;  Laterality: Left;  Left 2nd Stage Basilic Vein Transposition  with gortex graft revision using 26mx10cm graft  . Shoulder open rotator cuff repair Right   . [name] capsule study N/A 08/27/2013    Procedure: [name] CAPSULE STUDY;  Surgeon: DMilus Banister MD;  [place]  Service: Endoscopy;  Laterality: N/A;   Family History  Problem Relation Age of Onset  . Diabetes Mother   . Hypertension Mother   . Stroke Mother   . Kidney failure Mother   . Cancer Father    Social History He has a 6.25 pack-year smoking history, but states he has cut back to twp cigarettes a day.  He reports that he uses illicit drugs ("Crack" cocaine and Marijuana) and smokes crack cocaine about once a month.   He denies any use of alcohol.  Allergies  Allergen Reactions  . Reglan [Metoclopramide] Other (See Comments)    [place]  Prior to Admission medications   Medication Sig Start Date End Date Taking? Authorizing Provider  calcium acetate (PHOSLO) 667  MG capsule Take 667-1,334 mg by mouth See admin instructions. Take 1334 mg by mouth three times a day with meals and take 6637ma day with snacks   Yes Historical Provider, MD  hydrALAZINE (APRESOLINE) 100 MG tablet Take 100 mg by mouth 2 (two) times daily.    Yes Historical Provider, MD  levETIRAcetam (KEPPRA) 1000 MG tablet Take 1,000 mg by mouth daily.    Yes Historical Provider, MD   Labs:  Results for orders placed during the hospital encounter of 01/19/14 (from the past 48 hour(s))  CBC WITH DIFFERENTIAL     Status: Abnormal   Collection Time    01/19/14  7:56 AM      Result Value Ref Range   WBC 2.9 (*) 4.0 - 10.5 K/uL   RBC 5.03  4.22 - 5.81 MIL/uL   Hemoglobin 14.5  13.0 - 17.0 g/dL   HCT 43.1  39.0 - 52.0 %   MCV 85.7  78.0 - 100.0 fL   MCH 28.8  26.0 - 34.0 pg   MCHC 33.6  30.0 - 36.0 g/dL   RDW 16.1 (*) 11.5 - 15.5 %   Platelets 219  150 - 400 K/uL   Neutrophils Relative % 52  43 - 77 %   Neutro Abs 1.5 (*) 1.7 - 7.7 K/uL   Lymphocytes Relative 35  12 - 46 %   Lymphs Abs 1.0  0.7 - 4.0 K/uL   Monocytes Relative 9  3 - 12 %   Monocytes Absolute 0.3  0.1 - 1.0 K/uL   Eosinophils Relative 3  0 - 5 %   Eosinophils Absolute 0.1  0.0 - 0.7 K/uL   Basophils Relative 1  0 - 1 %   Basophils Absolute 0.0  0.0 - 0.1 K/uL  BASIC METABOLIC PANEL     Status: Abnormal   Collection Time    01/19/14  7:56 AM      Result Value Ref Range   Sodium 135 (*) 137 - 147 mEq/L   Potassium 3.9  3.7 - 5.3 mEq/L   Chloride 89 (*) 96 - 112 mEq/L   CO2 24  19 - 32 mEq/L   Glucose, Bld 165 (*) 70 - 99 mg/dL   BUN 40 (*) 6 - 23 mg/dL   Creatinine, Ser 9.52 (*) 0.50 - 1.35 mg/dL   Calcium 8.8  8.4 - 10.5 mg/dL   GFR calc non Af Amer 5 (*) >90 mL/min   GFR  calc Af Amer 6 (*) >90 mL/min   Comment: (NOTE)     The eGFR has been calculated using the CKD EPI equation.     This calculation has not been validated in all clinical situations.     eGFR's persistently <90 mL/min signify possible Chronic  Kidney     Disease.   Anion gap 22 (*) 5 - 15  SEDIMENTATION RATE     Status: Abnormal   Collection Time    01/19/14  7:56 AM      Result Value Ref Range   Sed Rate 30 (*) 0 - 16 mm/hr  TROPONIN I     Status: None   Collection Time    01/19/14  7:56 AM      Result Value Ref Range   Troponin I <0.30  <0.30 ng/mL   Comment:            Due to the release kinetics of cTnI,     a negative result within the first hours     of the onset of symptoms does not rule out     myocardial infarction with certainty.     If myocardial infarction is still suspected,     repeat the test at appropriate intervals.  POC OCCULT BLOOD, ED     Status: None   Collection Time    01/19/14  8:24 AM      Result Value Ref Range   Fecal Occult Bld NEGATIVE  NEGATIVE  TROPONIN I     Status: None   Collection Time    01/19/14  1:10 PM      Result Value Ref Range   Troponin I <0.30  <0.30 ng/mL   Comment:            Due to the release kinetics of cTnI,     a negative result within the first hours     of the onset of symptoms does not rule out     myocardial infarction with certainty.     If myocardial infarction is still suspected,     repeat the test at appropriate intervals.   Review of Systems: Constitutional: negative for chills, fatigue, fevers and sweats Ears, nose, mouth, throat, and face: negative for earaches, congestion, sore throat Respiratory: negative for cough, dyspnea on exertion, hemoptysis and sputum Cardiovascular: negative for chest pain, chest pressure/discomfort, dyspnea, orthopnea and palpitations Gastrointestinal: positive for diarrhea, nausea and vomiting, negative for abdominal pain Genitourinary:negative, anuric Musculoskeletal:negative for arthralgias, back pain, myalgias and neck pain Neurological: positive for dizziness, headaches and weakness, negative for gait problems and paresthesia  Physical Exam: Filed Vitals:   01/19/14 1345  BP: 193/103  Pulse: 70  Temp:    Resp: 13     General appearance: alert, cooperative and no distress Head: Normocephalic, without obvious abnormality, atraumatic Eyes:  left eye conjunctiva mildly injected, local swelling, no discharge, tender to touch Neck: no adenopathy, no carotid bruit, no JVD and supple, symmetrical, trachea midline Resp: clear to auscultation bilaterally Cardio: regular rate and rhythm, S1, S2 normal, no murmur, click, rub or gallop GI: soft, non-tender; bowel sounds normal; no masses,  no organomegaly Extremities: extremities normal, atraumatic, no cyanosis or edema Neurologic: Grossly normal Dialysis Access: AVF @ LUA with + bruit   Assessment/Plan: 1. Left eye pain - associated w/ HA, nausea, vomiting, diarrhea x 1; CT head with no acute abnormalities; Vancomycin & Rocephin started.  Per primary. 2. ESRD - HD on TTS @ [facility], K 3.9.  HD pending. 3. Hypertension/volume - BP 181/98 on Hydralazine 100 mg bid, also Irbesartan 150 mg qd as outpatient; wt 57.4 kg, below EDW, CXR negative. 4. Anemia - Hgb 14.5, Hx chronic normocytic anemia w/ negative GI workup, BM Bx w/ atypical B cells, followed by Hematology.  Hold Aranesp, continue weekly Fe. 5. Metabolic bone disease - Ca 8.8, last P 4, iPTH 221; Hectorol 2 mcg, Phoslo 1 with meals. 6. Nutrition - Last Alb 3.7, renal diet, multivitamin.  7. Hx Dm Type 2  8. Hepatitis C  9. Hx seizure disorder - on Keppra. 10. Hx cocaine abuse - admits smoking crack 3 days ago.   Hazelee Harbold,[place] 01/19/2014, 2:11 PM   Attending Nephrologist:  Edrick Oh, MD

## 2014-01-19 NOTE — Consult Note (Signed)
I have seen and examined this patient and agree with the plan of care . Patient ESRD seen on dialysis No dialysis related issues  [name] W 01/19/2014, 4:27 PM

## 2014-01-19 NOTE — H&P (Addendum)
         Attending progress note    Date of Admission:  01/19/2014     Principal Problem:   Painful ophthalmoplegia of left eye Active Problems:   Hypertension   ESRD on hemodialysis   Cocaine abuse   Tobacco abuse   . calcium acetate  1,334 mg Oral TID WC  . cefTRIAXone (ROCEPHIN)  IV  2 g Intravenous Q24H  . doxercalciferol  2 mcg Intravenous Q T,Th,Sa-HD  . heparin  5,000 Units Subcutaneous 3 times per day  . hydrALAZINE  100 mg Oral BID  . levETIRAcetam  1,000 mg Oral Daily  . pantoprazole  40 mg Oral Daily  . sodium chloride  3 mL Intravenous Q12H  . sodium chloride  3 mL Intravenous Q12H    I have seen and examined [name] and discussed his case with Dr. Redmond Pulling. Mr. Hoglund has a three-day history of progressive left eye pain associated with discharge, decrease visual acuity, horizontal diplopia and progressive swelling around his left eye. His diplopia persists when he closes his right eye. He rates his pain as severe. His right eye vision is unchanged. I attempted to examine him while he was on hemodialysis. Funduscopic exam is quite limited. He has periorbital swelling with moderately severe pain on palpation. He has restricted eye movements on the left particularly with adduction. We have requested urgent ophthalmologic evaluation. I will start empiric vancomycin and ceftriaxone after blood cultures. We will need to review his noncontrasted CT scan with radiology again. I am concerned that he has orbital cellulitis. I am not as concerned about the possibility of cavernous sinus thrombosis given no changes in his right eye but we will need to consider further imaging. No sinus abnormalities were noted on CT. [name] hyperglycemic now and has a history of diabetes but states that he has not been on medication for many years. I suspect this is a bacterial process but cannot rule out possibility of invasive mucormycosis. I doubt that he has acute temporal arteritis and will stop  steroids.  Michel Bickers, MD [facility] [phone] pager   [phone] cell 01/19/2014, 3:13 PM

## 2014-01-19 NOTE — H&P (Signed)
Date: 01/19/2014               Patient Name:  [name] MRN: [id number]  DOB: May 23, 1955 Age / Sex: 58 y.o., male   PCP: Windy Kalata, MD              Medical Service: Internal Medicine Teaching Service              Attending Physician: Dr. Michel Bickers, MD    First Contact: Josiah Lobo, MS4 Pager: [phone]  Second Contact: Dr. Duwaine Maxin, DO Pager: [phone]            After Hours (After 5p/  First Contact Pager: [phone]  weekends / holidays): Second Contact Pager: [phone]   Chief Complaint: L eye pain, discharge, swelling and L sided headache   History of Present Illness: [name] is a 58 year old African American male with a history of hypertension, ESRD (TTS dialysis), and prior GI bleeds who presents with L eye pain and associated discharge and swelling as well as a L-sided headache. The patient says that the pain in his left eye started three days upon waking up in the morning and describes it as "throbbing." The pain has remained since it started. Moving his left eye in all directions is painful. The patient says that he has noticed "sticky" discharge from his left eye but cannot recall the appearance and that it has been tough to open his eyelids in the morning. He also reports gradual vision loss and diplopia since the onset of the pain. Due to the difficulty seeing, patient said that he has not been able to cook food for the past three days. He denies jaw claudication or ear pain. He denies feeling a foreign object in his eye, does not wear contact lenses or glasses though he says that he should get glasses because his general vision has been declining recently. He denies dizziness but reports some tingling and numbness in his four extremities that has been going on for a while. He has never had these symptoms before.   He was in dialysis this morning and the nurse had observed the difference in his L eye and told him to present to the ED.   Patient had  breakfast from [employer] this morning and had an episode of emesis and vomiting after arriving to the ED. He denies any abdominal pain and had normal BMs prior to this episode.   In the ED, patient had an measured IOP of 10-20 mmHg, fluorscein exam was negative for any abrasion, and CT head w/o contrast was negative for any acute intracranial process. He received a dose of 125 mg methylprednisolone due to concern for giant cell arteritis.    Meds: Current Facility-Administered Medications  Medication Dose Route Frequency Provider Last Rate Last Dose  . 0.9 %  sodium chloride infusion  250 mL Intravenous PRN Francesca Oman, DO      . acetaminophen (TYLENOL) tablet 650 mg  650 mg Oral Q6H PRN Francesca Oman, DO       Or  . acetaminophen (TYLENOL) suppository 650 mg  650 mg Rectal Q6H PRN Francesca Oman, DO      . calcium acetate (PHOSLO) capsule 1,334 mg  1,334 mg Oral TID WC Francesca Oman, DO      . calcium acetate (PHOSLO) capsule 667 mg  667 mg Oral PRN Michel Bickers, MD      . cefTRIAXone (ROCEPHIN) 2 g in dextrose 5 %  50 mL IVPB  2 g Intravenous Q24H Michel Bickers, MD   2 g at 01/19/14 1703  . doxercalciferol (HECTOROL) injection 2 mcg  2 mcg Intravenous Q T,Th,Sa-HD Ramiro Harvest, PA-C   2 mcg at 01/19/14 1659  . heparin injection 5,000 Units  5,000 Units Subcutaneous 3 times per day Francesca Oman, DO      . hydrALAZINE (APRESOLINE) tablet 100 mg  100 mg Oral BID Michel Bickers, MD      . HYDROcodone-acetaminophen (NORCO/VICODIN) 5-325 MG per tablet 1-2 tablet  1-2 tablet Oral Q6H PRN Francesca Oman, DO   2 tablet at 01/19/14 1449  . levETIRAcetam (KEPPRA) tablet 1,000 mg  1,000 mg Oral Daily Francesca Oman, DO      . pantoprazole (PROTONIX) EC tablet 40 mg  40 mg Oral Daily Francesca Oman, DO      . sodium chloride 0.9 % injection 3 mL  3 mL Intravenous Q12H Francesca Oman, DO      . sodium chloride 0.9 % injection 3 mL  3 mL Intravenous Q12H Francesca Oman, DO      . sodium chloride 0.9 %  injection 3 mL  3 mL Intravenous PRN Francesca Oman, DO      . Derrill Memo ON 01/22/2014] vancomycin (VANCOCIN) 500 mg in sodium chloride 0.9 % 100 mL IVPB  500 mg Intravenous Q T,Th,Sa-HD Michel Bickers, MD        Allergies: Allergies as of 01/19/2014 - Review Complete 01/19/2014  Allergen Reaction Noted  . Reglan [metoclopramide] Other (See Comments) 03/02/2012   Past Medical History  Diagnosis Date  . Hypertension   . Anemia, chronic disease   . CHF (congestive heart failure)     diastolic.  EF 60 - 65% per [facility] eco 11/2011  . Cocaine abuse     mentioned in notes from [facility]  . Hepatitis C antibody test positive     was HIV negative, 02/28/12  . Hepatitis B core antibody positive     03/01/10  . Positive QuantiFERON-TB Gold test     11/2011  . Helicobacter pylori gastritis     not defined if this was treated  . Polyp of colon, adenomatous     May 2012.  Dr Trenton Founds in [place]  . Shortness of breath   . Hematochezia   . Head injury, closed, with concussion   . History of blood transfusion     "last one was 2 days ago" (11/09/2012)  . Type II diabetes mellitus     on oral pills only  . Arthritis     "right shoulder" (11/09/2012)  . Seizure disorder     questionable history of - will need to clarify with PCP  . Headache(784.0)     "q other day" (11/09/2012)  . ESRD on hemodialysis since 2012    Patient says he started HD in 2012.  ESRD was due to "drugs", primarily used cocaine.  Has 3-5 year hx of HTN, no DM.  Gets HD on TTS schedule at [facility].  [name] from [place] and recently moved here.    . Hypercalcemia   . Hyperpotassemia   . Seizures    Past Surgical History  Procedure Laterality Date  . Shoulder open rotator cuff repair Right   . Total knee arthroplasty Left   . Bascilic vein transposition  03/07/2012    Procedure: BASCILIC VEIN TRANSPOSITION;  Surgeon: Conrad Bellingham, MD;  Location: [facility];  Service:  Vascular;  Laterality: Left;  First Stage  .  Insertion of dialysis catheter      right chest  . Bascilic vein transposition Left 05/31/2012    Procedure: BASCILIC VEIN TRANSPOSITION;  Surgeon: Conrad Dunkirk, MD;  Location: [facility];  Service: Vascular;  Laterality: Left;  Left 2nd Stage Basilic Vein Transposition with gortex graft revision using 32mx10cm graft  . Shoulder open rotator cuff repair Right   . [name] capsule study N/A 08/27/2013    Procedure: [name] CAPSULE STUDY;  Surgeon: DMilus Banister MD;  Location[place] [facility]  Service: Endoscopy;  Laterality: N/A;   Family History  Problem Relation Age of Onset  . Diabetes Mother   . Hypertension Mother   . Stroke Mother   . Kidney failure Mother   . Cancer Father    History   Social History  . Marital Status: Single    Spouse Name: N/A    Number of Children: 1  . Years of Education: N/A   Occupational History  . Unemployed    Social History Main Topics  . Smoking status: Current Every Day Smoker -- 0.25 packs/day for 25 years    Types: Cigarettes  . Smokeless tobacco: Never Used  . Alcohol Use: No     Comment: 11/09/2012 "been stopped drinking 1-2 yr ago"  . Drug Use: Yes    Special: "Crack" cocaine, Marijuana     Comment: 11/09/2012 "used weed > 20 yr ago; smokes crack-cocaine- last time ~ 5 months ago"  . Sexual Activity: Yes   Other Topics Concern  . Not on file   Social History Narrative  . No narrative on file    Review of Systems: Pertinent items are noted in HPI.  Physical Exam: Blood pressure 164/79, pulse 76, temperature 98.1 F (36.7 C), temperature source Oral, resp. rate 20, height 5' 9" (1.753 m), weight 59.7 kg (131 lb 9.8 oz), SpO2 98.00%.  General Appearance:  Alert, cooperative, no distress   Head:  Normocephalic, without obvious abnormality, atraumatic. Severe tenderness to palpation on L temporal, maxillary, and mandibular region.   Eyes:  L eye is significantly more swollen than R. Conjunctiva of L eye slightly more injected compared to R  eye. R pupil is sluggishly reactive. EOM is limited on L abduction, adduction, elevation, and depression. L pupil looks "down and out" on general appearance. Fundoscopic exam is limited. Horizontal diplopia as patient says seeing 2 fingers when examiner held 1; 4 when 2 was held; 6 when 3 was held. R eye is reactive and EOM is intact. Eye chart exam is limited; 20/40 on both eyes.   Ears:  Normal TM's and external ear canals, both ears   Neck:  Supple, symmetrical, trachea midline, no adenopathy;  thyroid: No enlargement/tenderness/nodules; no carotid  bruit or JVD   Back:  Symmetric, no curvature, ROM normal, no CVA tenderness   Lungs:  Clear to auscultation bilaterally, respirations unlabored   Chest wall:  No tenderness or deformity   Heart:  Regular rate and rhythm, S1 and S2 normal, no murmur, rub or gallop   Abdomen:  Soft, non-tender, bowel sounds active all four quadrants,  no masses, no organomegaly   Extremities:  Lower extremities extremely dry with cracked skin from toes to knee   Pulses:  2+ and symmetric all extremities   Skin:  Dry and cracked skin on lower extremities      Neurologic:  CNV - XII grossly intact. 4+/5 strength of L leg extension but  rest were 5/5    Lab results: Basic Metabolic Panel:  Recent Labs  01/19/14 0756  NA 135*  K 3.9  CL 89*  CO2 24  GLUCOSE 165*  BUN 40*  CREATININE 9.52*  CALCIUM 8.8   CBC:  Recent Labs  01/19/14 0756  WBC 2.9*  NEUTROABS 1.5*  HGB 14.5  HCT 43.1  MCV 85.7  PLT 219   Cardiac Enzymes:  Recent Labs  01/19/14 0756 01/19/14 1310  TROPONINI <0.30 <0.30   Urine Drug Screen: Drugs of Abuse     Component Value Date/Time   LABOPIA NONE DETECTED 08/24/2013 2115   COCAINSCRNUR POSITIVE* 08/24/2013 2115   LABBENZ NONE DETECTED 08/24/2013 2115   AMPHETMU NONE DETECTED 08/24/2013 2115   THCU NONE DETECTED 08/24/2013 2115   LABBARB NONE DETECTED 08/24/2013 2115   Imaging results:  Ct Head Wo Contrast  01/19/2014    CLINICAL DATA:  Acute headache and the left sided blurry vision. Lethargy. Initial encounter.  EXAM: CT HEAD WITHOUT CONTRAST  TECHNIQUE: Contiguous axial images were obtained from the base of the skull through the vertex without intravenous contrast.  COMPARISON:  None.  FINDINGS: Scattered periventricular hypodensities, most conspicuous about the anterior horn of the right lateral ventricle (image 19, series 201), compatible with microvascular ischemic disease. Gray-white differentiation is otherwise well maintained without CT evidence of acute large territory infarct. No intraparenchymal or extra-axial mass or hemorrhage. Normal size a configuration of the ventricles and basilar cisterns. No midline shift. Limited visualization of the paranasal sinuses and mastoid air cells is normal. Intracranial atherosclerosis. Regional soft tissues are normal. No displaced calvarial fracture.  IMPRESSION: Mild microvascular ischemic disease without acute intracranial process.   Electronically Signed   By: Sandi Mariscal M.D.   On: 01/19/2014 10:28   Dg Chest Portable 1 View  01/19/2014   CLINICAL DATA:  Dyspnea for 2 days. History of hypertension, diabetes and smoking. Initial encounter.  EXAM: PORTABLE CHEST - 1 VIEW  COMPARISON:  01/02/2013; 12/19/2012; 05/06/2012; 03/02/2012  FINDINGS: Grossly unchanged enlarged cardiac silhouette. Overall improved aeration of the lungs with minimal residual right mid lung heterogeneous opacities, stable since the 02/2012 examination and favored to represent atelectasis or scar. No new focal airspace opacities. No pleural effusion pneumothorax. No evidence of edema. Postsurgical change of the right glenohumeral joint.  IMPRESSION: 1.  No acute cardiopulmonary disease. 2. Improved aerated of lungs with persistent right mid lung atelectasis / scar.   Electronically Signed   By: Sandi Mariscal M.D.   On: 01/19/2014 08:12   Other results: EKG: Sinus rhythm, biatrial enlargment, LVH with  secondary repolarization abnormality, prolonged QT interval, T waves diffusely inverted compared to previous   Assessment & Plan by Problem: Principal Problem:   Painful ophthalmoplegia of left eye Active Problems:   Hypertension   ESRD on hemodialysis   Cocaine abuse   Tobacco abuse   1. Painful ophthalmoplegia of left eye: Differential remains broad including giant cell arteritis, orbital cellulitis, cavernous sinus thrombosis, acute-angle glaucoma, and unindentified neurological pathologic process. Symptoms such as three-day history of sudden onset, progressive L eye pain and L sided head pain along with temporal region tenderness is concerning for giant cell arteritis although patient's age is less than 6, patient does not report complete monoocular visual loss, and ESR is not severely elevated at 30. Patient was given 125 mg dose of methylprednisolone as precautionary measures. Orbital cellulitis is more so considered in this presentation given the periorbital swelling and pain, ophthalmoplegia, gradual  vision, pain with eye movement and his increased risk for infection given his history of ESRD, possible B cell lymphoproliferative disorder, and history of uncontrolled diabetes (not on medication currently). However, CT scan does not note any swelling and patient is afebrile. Cavernous sinus thrombosis is a possibility given no changes in his R eye and patient's risk factors of current smoking and hypertension. One can consider recent cocaine abuse leading to vasoconstriction of retinal arteries but this is unlikely given no monocular loss of vision. Acute-angle glaucoma is not likely at this time given his IOP at the ED were <20, no scleral injection, and no dilated pupil noted on exam. It is still not possible to exclude a neurological pathologic process at this moment and we may need further imaging to fully delineate his clinical picture. Given the high concern for orbital cellulitis, we will  start empiric antibiotic treatment. We have requested urgent ophthalmologic evaluation given the acuity and severity of symptoms.  --consulted Dr. Posey Pronto at opthalmology, will f/u on their recs  --f/u with CT imaging results with radiology  --start vancomycin and ceftriaxone  --consider further imaging such as MRI A/V or CT A/V for cavernous venous thrombosis  --hold steroids given less concern for GCA  --Tylenol PRN pain  [ ] BMP and CMP AM  [ ] EKG AM  [ ] f/u blood cultures  [ ] f/u troponin x 3  [ ] f/u UA   2. Hypertension: elevated on admission at 208/162 and remains in the 009Q-330Q systolic but as per previous records, patient's systolic runs in the 762U to 170s  --continue home hydralazine   3. Diabetes Mellitus: Patient is not on any hypoglycemics currently; glucose was 165 on admission.  --may consider SSI if hyperglycemia persists   4. ESRD on hemodialysis, TThS schedule: Patient missed his Thursday session but went 10/3 morning, where he was directed to ED. Patient completed his dialysis session inpatient.  --renal consulted, appreciate recs  --doxercalciferol TTS   5. Cocaine abuse: patient has a long history of cocaine abuse with recent use being about three days ago.  --counseled on cessation   6. Tobacco abuse:  --counseled on cessation  --nicotine patch PRN   7. Seizure disorder:  --continue home Keppra   8. Diet:  -renal diet with 1200 mL fluid restriction   Dispo: Disposition is deferred at this time, awaiting improvement of current medical problems. Anticipated discharge in approximately 1-2 day(s).  The patient does have a current PCP Windy Kalata, MD) and does need an [facility] hospital follow-up appointment after discharge.  The patient does not have transportation limitations that hinder transportation to clinic appointments.   This is a Careers information officer Note.  The care of the patient was discussed with Dr.Wilson and Dr. Megan Salon and the assessment and  plan was formulated with their assistance.  Please see their note for official documentation of the patient encounter.   Signed: Josiah Lobo, Med Student 01/19/2014, 8:11 PM

## 2014-01-19 NOTE — ED Notes (Addendum)
Pt from dialysis center via [facility] with c/o left eye pain, swelling, drainage, and redness that is preventing him from doing his daily activities.  Pt is also having diarrhea starting today with vomiting yesterday and today.  Decreased appetite and weakness x 3 days.  Pt in NAD, A&O.

## 2014-01-19 NOTE — Consult Note (Signed)
[name]                                                                               01/19/2014                                               Pediatric Ophthalmology Consultation                                         Consult requested by: Dr. Shon Baton  Reason for consultation:  Suspected GCA  HPI: 58yo male with 3day history of increasing swelling of the left eyelids, especially upper. The eyelid, especially superficial to the lacrimal gland, is tender to touch. He has some double vision but cannot relay if this is new or long standing; it is in non-primary gaze only. There is some pain with eye movements only in his left eye. His vision is blurry but no worse than usual. He does not wear glasses and has never had an eye exam. The patient is currently on T/R/S dialysis and has multiple comorbidities including HTN, DM and cocaine abuse. On admission this morning his labs were notable for slight leukopenia and a mildly elevated ESR. Patient denies jaw claudication, decreased vision from baseline, tenderness when combing his hair, truncal weakness outside of baseline due to dialysis status, decreased color vision, joint aches or fever.  Pertinent Medical History:   Active Ambulatory Problems    Diagnosis Date Noted  . Hypertension   . Diabetes mellitus without complication   . ESRD on hemodialysis   . Seizure disorder   . Normocytic anemia 03/02/2012  . Cocaine abuse 03/02/2012  . Tobacco abuse 03/02/2012  . GI bleed 05/04/2012  . Hemorrhage of rectum and anus 05/05/2012  . Anemia 11/07/2012  . Heme + stool 11/08/2012  . Malnutrition of moderate degree 01/03/2013  . Microcytic anemia 08/21/2013  . Acute blood loss anemia 08/21/2013   Resolved Ambulatory Problems    Diagnosis Date Noted  . Hyperkalemia 03/02/2012  . Hyperphosphatemia 03/02/2012  . Hypocalcemia 03/02/2012  . Volume overload 03/02/2012  . End stage renal disease 05/26/2012  . Hypokalemia 11/07/2012   Past Medical  History  Diagnosis Date  . Anemia, chronic disease   . CHF (congestive heart failure)   . Hepatitis C antibody test positive   . Hepatitis B core antibody positive   . Positive QuantiFERON-TB Gold test   . Helicobacter pylori gastritis   . Polyp of colon, adenomatous   . Shortness of breath   . Hematochezia   . Head injury, closed, with concussion   . History of blood transfusion   . Type II diabetes mellitus   . Arthritis   . Headache(784.0)   . Hypercalcemia   . Hyperpotassemia   . Seizures      Pertinent Ophthalmic History: None     Current Eye Medications: none  Systemic medications on admission:   Medications Prior to Admission  Medication Sig Dispense  Refill  . calcium acetate (PHOSLO) 667 MG capsule Take 667-1,334 mg by mouth See admin instructions. Take 1334 mg by mouth three times a day with meals and take 622m a day with snacks      . hydrALAZINE (APRESOLINE) 100 MG tablet Take 100 mg by mouth 2 (two) times daily.       .Marland KitchenlevETIRAcetam (KEPPRA) 1000 MG tablet Take 1,000 mg by mouth daily.            ROS: as in HPI  Visual Fields: FTC OU (difficult understanding of exam)   Pupils:  Equal, brisk, no APD  Near acuity:   cc +2.50  OD 20/30      cc +2.50  OS 20/30  TA:       OD: 14 mmhg    OS: 15 mmhg  by Tonopen  Normal to palpation OU   Dilation:  both eyes        Medication used  [ X ] NS 2.5% [ X ]Tropicamide  [  ] Cyclogyl [  ] Cyclomydril  External:   OD:  Normal   OS:  1-2+ swelling inducing mild ptosis; tenderness over lacrimal gland and orbital rim; no tenderness over temporal artery; moderate proptosis  Anterior segment exam:  By penlight   Conjunctiva:  OD:  Quiet     OS:  Quiet    Cornea:    OD: Clear, no fluorescein stain, arcus   OS: Clear, no fluorescein stain, arcus  Anterior Chamber:   OD:  Deep/quiet     OS:  Deep/quiet    Iris:    OD:  Normal      OS:  Normal     Lens:    OD:  2+ NS     OS:  2+ NS      Optic disc:  OD:   Flat, sharp, pink, healthy; no hemes, no swelling, no NVD  OS:  Flat, sharp, pink, healthy; no hemes, no swelling, no NVD  Central retina--examined with indirect ophthalmoscope:  OD:  Macula and vessels normal; media clear     OS:  Macula and vessels normal; media clear     Peripheral retina--examined with indirect ophthalmoscope:   OD:  Normal to ora 360 degrees; small cotton wool spot superotemporal to arcade  OS:  Normal to ora 360 degrees  Impression:   [name] with complicated medical history with painful proptosis of left eye. No definitive diagnosis available with current labs and CT (orbital views not performed) but differential diagnosis as follows: 1. Dacryoadenitis with possible component of preseptal or early orbital cellulitis: lack of fever and redness but leukopenia and poor health might make it difficult for patient to mount appropriate response. 2. 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 Less likely diagnoses: thyroid ophthalmopathy, sarcoidosis/Wegener's, mucormycosis Given lack of findings consistent with GCA on examination or history (especially lack of vision loss, RAPD or optic nerve involvement) would recommend removing this from differential.  Recommendations/Plan: 1. Antibiotics for possible infectious dacryoadenitis: Ancef 1g q8h until resolving; if improving within 24-48hrs, may discharge with orals for 2wk course. 2. Recommend holding steroids for next 24hrs to allow for evaluation of antibiotic effect. If no improvement seen or worsening, will recommend steroid initiation. 3. Will f/u with patient and/or team in 24-36hrs for reevaluation.  I've discussed these findings with the residents. Please contact our office  with any questions or concerns at [phone].  Julissa Browning

## 2014-01-19 NOTE — Progress Notes (Signed)
ANTIBIOTIC CONSULT NOTE - INITIAL  Pharmacy Consult for Vancomycin, Rocephin Indication: eye infection  Allergies  Allergen Reactions  . Reglan [Metoclopramide] Other (See Comments)    Tardive dyskinesia in 11/2011 in [place]    Patient Measurements: Height: 5\' 9"  (175.3 cm) Weight: 126 lb 8.7 oz (57.4 kg) IBW/kg (Calculated) : 70.7   Vital Signs: Temp: 97.8 F (36.6 C) (10/03 1432) Temp Source: Oral (10/03 1432) BP: 181/98 mmHg (10/03 1500) Pulse Rate: 77 (10/03 1500)  Labs:  Recent Labs  01/19/14 0756  WBC 2.9*  HGB 14.5  PLT 219  CREATININE 9.52*   Estimated Creatinine Clearance: 7 ml/min (by C-G formula based on Cr of 9.52). No results found for this basename: VANCOTROUGH, VANCOPEAK, VANCORANDOM, GENTTROUGH, GENTPEAK, GENTRANDOM, TOBRATROUGH, TOBRAPEAK, TOBRARND, AMIKACINPEAK, AMIKACINTROU, AMIKACIN,  in the last 72 hours   Microbiology: No results found for this or any previous visit (from the past 720 hour(s)).  Medical History: Past Medical History  Diagnosis Date  . Hypertension   . Anemia, chronic disease   . CHF (congestive heart failure)     diastolic.  EF 60 - 65% per [facility] eco 11/2011  . Cocaine abuse     mentioned in notes from [place]  . Hepatitis C antibody test positive     was HIV negative, 02/28/12  . Hepatitis B core antibody positive     03/01/10  . Positive QuantiFERON-TB Gold test     11/2011  . Helicobacter pylori gastritis     not defined if this was treated  . Polyp of colon, adenomatous     May 2012.  Dr Trenton Founds in [place]  . Shortness of breath   . Hematochezia   . Head injury, closed, with concussion   . History of blood transfusion     "last one was 2 days ago" (11/09/2012)  . Type II diabetes mellitus     on oral pills only  . Arthritis     "right shoulder" (11/09/2012)  . Seizure disorder     questionable history of - will need to clarify with PCP  . Headache(784.0)     "q other day" (11/09/2012)  . ESRD on  hemodialysis since 2012    Patient says he started HD in 2012.  ESRD was due to "drugs", primarily used cocaine.  Has 3-5 year hx of HTN, no DM.  Gets HD on TTS schedule at [place].  [name] from [place] and recently moved here.    . Hypercalcemia   . Hyperpotassemia   . Seizures     Assessment: 58 year old male admitted from HD session with painful ophthalmoplegia of the left eye and headache.  With ESRD - TTS HD  Pharmacy asked to begin vancomycin and Rocephin  Goal of Therapy:  Appropriate dosing  Plan:  1) Vancomycin 1250 mg iv x 1 dose now then 500 mg iv Q HD (TTS) 2) Rocephin 2 gram iv Q 24 hours 3) Follow up progress, fever trend, cultures  Thank you. Anette Guarneri, PharmD [phone]  01/19/2014,3:15 PM

## 2014-01-20 ENCOUNTER — Inpatient Hospital Stay (HOSPITAL_COMMUNITY): Payer: Medicare Other

## 2014-01-20 ENCOUNTER — Encounter (HOSPITAL_COMMUNITY): Payer: Self-pay | Admitting: *Deleted

## 2014-01-20 LAB — COMPREHENSIVE METABOLIC PANEL
ALBUMIN: 3.3 g/dL — AB (ref 3.5–5.2)
ALK PHOS: 63 U/L (ref 39–117)
ALT: 13 U/L (ref 0–53)
ALT: 11 U/L (ref 0–53)
AST: 19 U/L (ref 0–37)
AST: 21 U/L (ref 0–37)
Albumin: 3.3 g/dL — ABNORMAL LOW (ref 3.5–5.2)
Alkaline Phosphatase: 65 U/L (ref 39–117)
Anion gap: 15 (ref 5–15)
Anion gap: 13 (ref 5–15)
BUN: 16 mg/dL (ref 6–23)
BUN: 20 mg/dL (ref 6–23)
CO2: 26 mEq/L (ref 19–32)
CO2: 29 mEq/L (ref 19–32)
CREATININE: 5.47 mg/dL — AB (ref 0.50–1.35)
Calcium: 8.2 mg/dL — ABNORMAL LOW (ref 8.4–10.5)
Calcium: 8.2 mg/dL — ABNORMAL LOW (ref 8.4–10.5)
Chloride: 91 mEq/L — ABNORMAL LOW (ref 96–112)
Chloride: 94 mEq/L — ABNORMAL LOW (ref 96–112)
Creatinine, Ser: 5.98 mg/dL — ABNORMAL HIGH (ref 0.50–1.35)
GFR calc Af Amer: 12 mL/min — ABNORMAL LOW (ref >90)
GFR calc Af Amer: 11 mL/min — ABNORMAL LOW (ref >90)
GFR calc non Af Amer: 11 mL/min — ABNORMAL LOW (ref >90)
GFR calc non Af Amer: 9 mL/min — ABNORMAL LOW (ref >90)
GLUCOSE: 106 mg/dL — AB (ref 70–99)
Glucose, Bld: 228 mg/dL — ABNORMAL HIGH (ref 70–99)
POTASSIUM: 3.9 meq/L (ref 3.7–5.3)
Potassium: 3.6 mEq/L — ABNORMAL LOW (ref 3.7–5.3)
SODIUM: 136 meq/L — AB (ref 137–147)
Sodium: 132 mEq/L — ABNORMAL LOW (ref 137–147)
TOTAL PROTEIN: 7.9 g/dL (ref 6.0–8.3)
TOTAL PROTEIN: 8.4 g/dL — AB (ref 6.0–8.3)
Total Bilirubin: 0.3 mg/dL (ref 0.3–1.2)
Total Bilirubin: 0.2 mg/dL — ABNORMAL LOW (ref 0.3–1.2)

## 2014-01-20 LAB — CBC
HCT: 38.2 % — ABNORMAL LOW (ref 39.0–52.0)
HCT: 39.3 % (ref 39.0–52.0)
HEMOGLOBIN: 12.4 g/dL — AB (ref 13.0–17.0)
Hemoglobin: 12.9 g/dL — ABNORMAL LOW (ref 13.0–17.0)
MCH: 28.3 pg (ref 26.0–34.0)
MCH: 28.4 pg (ref 26.0–34.0)
MCHC: 32.5 g/dL (ref 30.0–36.0)
MCHC: 32.8 g/dL (ref 30.0–36.0)
MCV: 86.6 fL (ref 78.0–100.0)
MCV: 87.2 fL (ref 78.0–100.0)
Platelets: 208 10*3/uL (ref 150–400)
Platelets: 218 10*3/uL (ref 150–400)
RBC: 4.38 MIL/uL (ref 4.22–5.81)
RBC: 4.54 MIL/uL (ref 4.22–5.81)
RDW: 16.4 % — AB (ref 11.5–15.5)
RDW: 16.6 % — ABNORMAL HIGH (ref 11.5–15.5)
WBC: 3.4 10*3/uL — ABNORMAL LOW (ref 4.0–10.5)
WBC: 4.5 10*3/uL (ref 4.0–10.5)

## 2014-01-20 LAB — TROPONIN I

## 2014-01-20 LAB — GLUCOSE, CAPILLARY: Glucose-Capillary: 101 mg/dL — ABNORMAL HIGH (ref 70–99)

## 2014-01-20 MED ORDER — SODIUM CHLORIDE 0.9 % IV SOLN
62.5000 mg | INTRAVENOUS | Status: DC
  Administered 2014-01-24: 62.5 mg via INTRAVENOUS
  Filled 2014-01-20 (×2): qty 5

## 2014-01-20 MED ORDER — OXYCODONE HCL 5 MG PO TABS
5.0000 mg | ORAL_TABLET | Freq: Once | ORAL | Status: AC
  Administered 2014-01-20: 5 mg via ORAL
  Filled 2014-01-20: qty 1

## 2014-01-20 MED ORDER — IOHEXOL 300 MG/ML  SOLN
80.0000 mL | Freq: Once | INTRAMUSCULAR | Status: AC | PRN
  Administered 2014-01-20: 80 mL via INTRAVENOUS

## 2014-01-20 MED ORDER — RENA-VITE PO TABS
1.0000 | ORAL_TABLET | Freq: Every day | ORAL | Status: DC
  Administered 2014-01-21 – 2014-01-28 (×8): 1 via ORAL
  Filled 2014-01-20 (×11): qty 1

## 2014-01-20 NOTE — Progress Notes (Signed)
Patient ID: [name], male   DOB: 10-24-1955, 58 y.o.   MRN: [id number]        Attending progress note    Date of Admission:  01/19/2014     Principal Problem:   Painful ophthalmoplegia of left eye Active Problems:   Hypertension   ESRD on hemodialysis   Cocaine abuse   Tobacco abuse   . calcium acetate  1,334 mg Oral TID WC  . cefTRIAXone (ROCEPHIN)  IV  2 g Intravenous Q24H  . doxercalciferol  2 mcg Intravenous Q T,Th,Sa-HD  . [START ON 01/24/2014] ferric gluconate (FERRLECIT/NULECIT) IV  62.5 mg Intravenous Q Thu-HD  . heparin  5,000 Units Subcutaneous 3 times per day  . hydrALAZINE  100 mg Oral BID  . levETIRAcetam  1,000 mg Oral Daily  . multivitamin  1 tablet Oral QHS  . pantoprazole  40 mg Oral Daily  . sodium chloride  3 mL Intravenous Q12H  . sodium chloride  3 mL Intravenous Q12H  . [START ON 01/22/2014] vancomycin  500 mg Intravenous Q T,Th,Sa-HD    I have seen and examined [name] again today with our team. His left periorbital swelling has decreased slightly. He continues to have pain that he rates as 7/10. His ptosis and left extraocular motion abnormalities are unchanged. Although his contrasted CT scan this morning does not reveal any left orbital abnormalities I am still concerned about early orbital cellulitis. We will continue vancomycin and ceftriaxone for now.  Michel Bickers, MD [facility] [phone] pager   [phone] cell 01/20/2014, 11:07 AM

## 2014-01-20 NOTE — Progress Notes (Signed)
I have personally seen and examined this patient and agree with the assessment/plan as outlined above by [facility] PA. Stephan Nelis K.,MD 01/20/2014 10:48 AM

## 2014-01-20 NOTE — Progress Notes (Signed)
[facility] Progress Note  Assessment/Plan: 1. Left eye pain - eval with plans per Opth 2. ESRD - TTS HD yesterday; Na improved; still needs some volume off - next HD Tuesday 3. Anemia - HGb > 12 - hold ESA; add weekly Fe 4. Secondary hyperparathyroidism - continue Hectorol/binders 5. HTN/volume - net UF 700 on Saturday  6. Nutrition - add multivit 7. DM - per primary 8. Hx sz d/o on Keprra 9. Hx cocaine - with recent crack use 10. Hep C +  Myriam Jacobson, [facility] Beeper [phone] 01/20/2014,8:35 AM  LOS: 1 day   Subjective:   Eye still hurts; worse with bright lights  Objective Filed Vitals:   01/19/14 1832 01/19/14 1900 01/19/14 2118 01/20/14 0515  BP: 166/86 164/79 133/66 129/66  Pulse: 74 76 74 66  Temp: 98.4 F (36.9 C) 98.1 F (36.7 C) 98.8 F (37.1 C) 98.1 F (36.7 C)  TempSrc: Oral Oral Oral Oral  Resp: 16 20 20 20   Height:  5\' 9"  (1.753 m) 5\' 9"  (1.753 m)   Weight: 56.5 kg (124 lb 9 oz) 59.7 kg (131 lb 9.8 oz) 59.5 kg (131 lb 2.8 oz)   SpO2: 96% 98% 97% 99%   Physical Exam General: NAD  HEENT: left eye puffy, injected conjunctiva Heart: RRR Lungs: no wheezes or rales Abdomen: soft NT Extremities: no sig edema Dialysis Access: left upper AVF patent  Dialysis Orders: TTS @ [facility]  58.5 kg 4 hrs 2K/2.25Ca 400/A1.5 No Heparin AVF @ LUA  Hectorol 2Aranesp 80 mcg 9/29  and Venofer 50 mg on Thurs. Hgb 10.8 9/24 13.7 10/1 36% sat   Additional Objective Labs: Basic Metabolic Panel:  Recent Labs Lab 01/19/14 0756 01/20/14 01/20/14 0503  NA 135* 132* 136*  K 3.9 3.6* 3.9  CL 89* 91* 94*  CO2 24 26 29   GLUCOSE 165* 228* 106*  BUN 40* 16 20  CREATININE 9.52* 5.47* 5.98*  CALCIUM 8.8 8.2* 8.2*   Liver Function Tests:  Recent Labs Lab 01/20/14 01/20/14 0503  AST 21 19  ALT 13 11  ALKPHOS 65 63  BILITOT 0.3 0.2*  PROT 8.4* 7.9  ALBUMIN 3.3* 3.3*   CBC:  Recent Labs Lab 01/19/14 0756 01/20/14  01/20/14 0503  WBC 2.9* 3.4* 4.5  NEUTROABS 1.5*  --   --   HGB 14.5 12.9* 12.4*  HCT 43.1 39.3 38.2*  MCV 85.7 86.6 87.2  PLT 219 218 208   Blood Culture    Component Value Date/Time   SDES URINE, RANDOM 11/07/2012 1903   SPECREQUEST NONE 11/07/2012 1903   CULT NO GROWTH 11/07/2012 1903   REPTSTATUS 11/08/2012 FINAL 11/07/2012 1903    Cardiac Enzymes:  Recent Labs Lab 01/19/14 0756 01/19/14 1310 01/19/14 1937 01/20/14  TROPONINI <0.30 <0.30 <0.30 <0.30  Studies/Results: Ct Head Wo Contrast  01/19/2014   CLINICAL DATA:  Acute headache and the left sided blurry vision. Lethargy. Initial encounter.  EXAM: CT HEAD WITHOUT CONTRAST  TECHNIQUE: Contiguous axial images were obtained from the base of the skull through the vertex without intravenous contrast.  COMPARISON:  None.  FINDINGS: Scattered periventricular hypodensities, most conspicuous about the anterior horn of the right lateral ventricle (image 19, series 201), compatible with microvascular ischemic disease. Gray-white differentiation is otherwise well maintained without CT evidence of acute large territory infarct. No intraparenchymal or extra-axial mass or hemorrhage. Normal size a configuration of the ventricles and basilar cisterns. No midline shift. Limited visualization of the paranasal sinuses  and mastoid air cells is normal. Intracranial atherosclerosis. Regional soft tissues are normal. No displaced calvarial fracture.  IMPRESSION: Mild microvascular ischemic disease without acute intracranial process.   Electronically Signed   By: Sandi Mariscal M.D.   On: 01/19/2014 10:28   Dg Chest Portable 1 View  01/19/2014   CLINICAL DATA:  Dyspnea for 2 days. History of hypertension, diabetes and smoking. Initial encounter.  EXAM: PORTABLE CHEST - 1 VIEW  COMPARISON:  01/02/2013; 12/19/2012; 05/06/2012; 03/02/2012  FINDINGS: Grossly unchanged enlarged cardiac silhouette. Overall improved aeration of the lungs with minimal residual right  mid lung heterogeneous opacities, stable since the 02/2012 examination and favored to represent atelectasis or scar. No new focal airspace opacities. No pleural effusion pneumothorax. No evidence of edema. Postsurgical change of the right glenohumeral joint.  IMPRESSION: 1.  No acute cardiopulmonary disease. 2. Improved aerated of lungs with persistent right mid lung atelectasis / scar.   Electronically Signed   By: Sandi Mariscal M.D.   On: 01/19/2014 08:12   Medications:   . calcium acetate  1,334 mg Oral TID WC  . cefTRIAXone (ROCEPHIN)  IV  2 g Intravenous Q24H  . doxercalciferol  2 mcg Intravenous Q T,Th,Sa-HD  . heparin  5,000 Units Subcutaneous 3 times per day  . hydrALAZINE  100 mg Oral BID  . levETIRAcetam  1,000 mg Oral Daily  . pantoprazole  40 mg Oral Daily  . sodium chloride  3 mL Intravenous Q12H  . sodium chloride  3 mL Intravenous Q12H  . [START ON 01/22/2014] vancomycin  500 mg Intravenous Q T,Th,Sa-HD

## 2014-01-20 NOTE — Progress Notes (Signed)
Subjective: [name] was seen and examined this morning.  [name] feeling ok.  He still has blurry vision but no change in vision.  His left eye appears less swollen.  He denies chest pain or dyspnea.     Objective: Vital signs in last 24 hours: Filed Vitals:   01/19/14 1900 01/19/14 2118 01/20/14 0515 01/20/14 0848  BP: 164/79 133/66 129/66 159/74  Pulse: 76 74 66 67  Temp: 98.1 F (36.7 C) 98.8 F (37.1 C) 98.1 F (36.7 C) 98.3 F (36.8 C)  TempSrc: Oral Oral Oral Oral  Resp: 20 20 20 16   Height: 5\' 9"  (1.753 m) 5\' 9"  (1.753 m)    Weight: 59.7 kg (131 lb 9.8 oz) 59.5 kg (131 lb 2.8 oz)    SpO2: 98% 97% 99% 98%   Weight change:   Intake/Output Summary (Last 24 hours) at 01/20/14 1252 Last data filed at 01/20/14 0700  Gross per 24 hour  Intake   1010 ml  Output    700 ml  Net    310 ml   General: resting in bed in NAD HEENT: limited left eye adduction, left eye appears down and out, there is less swelling than at admission Cardiac: RRR, no rubs, murmurs or gallops Pulm: clear to auscultation bilaterally, moving normal volumes of air Abd: soft, nontender, nondistended, BS present Ext: warm and well perfused, no pedal edema Neuro: alert and oriented X3, responding appropriately, following commands  Lab Results: Basic Metabolic Panel:  Recent Labs Lab 01/20/14 01/20/14 0503  NA 132* 136*  K 3.6* 3.9  CL 91* 94*  CO2 26 29  GLUCOSE 228* 106*  BUN 16 20  CREATININE 5.47* 5.98*  CALCIUM 8.2* 8.2*   Liver Function Tests:  Recent Labs Lab 01/20/14 01/20/14 0503  AST 21 19  ALT 13 11  ALKPHOS 65 63  BILITOT 0.3 0.2*  PROT 8.4* 7.9  ALBUMIN 3.3* 3.3*   CBC:  Recent Labs Lab 01/19/14 0756 01/20/14 01/20/14 0503  WBC 2.9* 3.4* 4.5  NEUTROABS 1.5*  --   --   HGB 14.5 12.9* 12.4*  HCT 43.1 39.3 38.2*  MCV 85.7 86.6 87.2  PLT 219 218 208   Cardiac Enzymes:  Recent Labs Lab 01/19/14 1310 01/19/14 1937 01/20/14  TROPONINI <0.30 <0.30 <0.30    CBG:  Recent Labs Lab 01/20/14 0819  GLUCAP 101*   Studies/Results: Ct Maxillofacial W/cm  01/20/2014   CLINICAL DATA:  New onset of left orbital swelling 4 days ago without reported trauma ; dialysis dependent renal failure ; subsequent encounter  EXAM: CT MAXILLOFACIAL WITH CONTRAST  TECHNIQUE: Multidetector CT imaging of the maxillofacial structures was performed with intravenous contrast. Multiplanar CT image reconstructions were also generated. A small metallic BB was placed on the right temple in order to reliably differentiate right from left.  CONTRAST:  73mL OMNIPAQUE IOHEXOL 300 MG/ML  SOLN intravenous  COMPARISON:  Noncontrast CT scan of the brain of January 19, 2014  FINDINGS: The globes are intact. There is no significant pre or postseptal edema. The periorbital soft tissues exhibit no evidence of abscess nor significant edema. The male are soft tissues are unremarkable.  The paranasal sinuses exhibit no air-fluid levels. There is a small amount of mucoperiosteal thickening in the left maxillary sinus. There is lucency within the posterior aspect of the left maxilla with small amount of increased density in the adjacent soft tissues along the lateral aspect of the alveolar ridge. A tiny focus of gas is  demonstrated on image 42. The posterior-most molar appears to be partially destroyed. The temporomandibular joints exhibit no acute abnormality.  IMPRESSION: 1. There are no findings to suggest significant inflammatory changes of the orbit. The pre and postseptal soft tissues are fairly symmetric with those on the right. 2. There is abnormal soft tissue density along the external surface of the left maxilla posteriorly with destructive changes of a posterior molar. The findings suggest periodontal inflammation without discrete abscess. 3. No significant inflammatory change within the paranasal sinuses is demonstrated.   Electronically Signed   By: David  Martinique   On: 01/20/2014 08:49    Medications: I have reviewed the patient's current medications. Scheduled Meds: . calcium acetate  1,334 mg Oral TID WC  . cefTRIAXone (ROCEPHIN)  IV  2 g Intravenous Q24H  . doxercalciferol  2 mcg Intravenous Q T,Th,Sa-HD  . [START ON 01/24/2014] ferric gluconate (FERRLECIT/NULECIT) IV  62.5 mg Intravenous Q Thu-HD  . heparin  5,000 Units Subcutaneous 3 times per day  . hydrALAZINE  100 mg Oral BID  . levETIRAcetam  1,000 mg Oral Daily  . multivitamin  1 tablet Oral QHS  . pantoprazole  40 mg Oral Daily  . sodium chloride  3 mL Intravenous Q12H  . sodium chloride  3 mL Intravenous Q12H  . [START ON 01/22/2014] vancomycin  500 mg Intravenous Q T,Th,Sa-HD   Continuous Infusions:  PRN Meds:.sodium chloride, acetaminophen, acetaminophen, calcium acetate, diphenhydrAMINE, HYDROcodone-acetaminophen, sodium chloride  Assessment/Plan: Painful ophthalmoplegia of left eye:  No worsening of visual symptoms overnight.  His eye appears less swollen but he still has some pain and limited EOM movement.  Ophthalmology has evaluated him and recommendations are appreciated.  Ophtho suggests dacryoadenitis with possible component of pre-septal or early orbital cellulitis vs. Pseudotumor of the orbit.  Less likely GCA given lack of vision loss, afferent pupillary defect or optic nerve involvement.  Unlikely thyroid related, mucormycosis, sarcoid or Wegener's.  Maxillofacial CT does not reveal significant inflammatory changes of the orbit.   - appreciate ophthalmology recommendations - continue vancomycin and ceftriaxone for suspected early orbital cellulitis - can consider further imaging such as MRI A/V or CT A/V for cavernous venous thrombosis  - hold steroids given unlikelihood of GCA - Tylenol PRN pain   New t-wave inversions inferior-lateral leads: New compared to prior EKGs.  These changes persist on AM EKG today.  Troponins have been negative and he denies chest pain making ACS less likely.  This may  be related to LVH.  [name] in NSR with no alarms on telemetry, however tele shows ST depression in lead II.  I spoke with on-call Cardiology who reviewed the patient's EKG and ECHO with me and agrees that these changes are most likely due to LVH, especially given ECHO findings of LVH, HTN hx and the patient's lack of symptoms.   - continue cardiac monitoring   Hypertension: stable. --continue home hydralazine   Diabetes Mellitus: Hgb A1c 5.0 July 2014. Serum BG 165. [name] not currently on treatment.  Stable.  - check A1c, monitor CBG, no need for trx currently   ESRD on hemodialysis, TThS schedule: Last HD was 10/03 as an inpatient.   - HD per nephrology  Cocaine abuse: patient has a long history of cocaine abuse with recent use being about three days ago.  - counseled on cessation   Tobacco abuse:  - counseled on cessation   Seizure disorder:  - continue home Keppra   VTE ppx: heparin  Diet: renal diet with 1200 mL fluid restriction   Dispo: Disposition is deferred at this time, awaiting improvement of current medical problems.  Anticipated discharge in approximately 1-2 day(s).   The patient does have a current PCP Windy Kalata, MD) and does not need an [facility] hospital follow-up appointment after discharge.  The patient does not know have transportation limitations that hinder transportation to clinic appointments.  .Services Needed at time of discharge: Y = Yes, Blank = No PT:   OT:   RN:   Equipment:   Other:     LOS: 1 day   Francesca Oman, DO 01/20/2014, 12:52 PM

## 2014-01-20 NOTE — Progress Notes (Signed)
New Admit  Arrival Method: Via stretcher from hemodialysis Mental Orientation:  A & O x 4 Telemetry: Placed on Tele Box 6E08 Admission Note: Assessment: Completed Skin: Intact but dry and flaky especially on the lower extremities IV:Rt hand NSL Pain: c/o of pain to left eye Tubes:None Safety Measures: Safety Fall Prevention Plan has been given, discussed and signed Admission: Completed [address] [place] Orientation: Patient has been orientated to the room, unit and staff.  Family:None  Patient has cell phone and cash at bedside.  He request that this be sent to hospital safe.  This will be done.    Orders have been reviewed and implemented. Will continue to monitor the patient. Call light has been placed within reach and bed alarm has been activated.   Earleen Reaper RN- [place], [place] Phone number: [phone]

## 2014-01-20 NOTE — Evaluation (Signed)
Physical Therapy Evaluation Patient Details Name: [name] MRN: [id number] DOB: Oct 31, 1955 Today's Date: 01/20/2014   History of Present Illness  History of Present Illness: Mr. Knoll is a 58 year old African American male with a history of hypertension, ESRD (TTS dialysis), and prior GI bleeds who presents with L eye pain and associated discharge and swelling as well as a L-sided headache.  Clinical Impression   Pt admitted with above. Pt currently with functional limitations due to the deficits listed below (see PT Problem List).  Pt will benefit from skilled PT to increase their independence and safety with mobility to allow discharge to the venue listed below.   Unsteady gait; will plan to work with assistive device for gait steadiness and will likely progress well enough to dc home      Follow Up Recommendations Home health PT;Outpatient PT    Equipment Recommendations  Rolling walker with 5" wheels    Recommendations for Other Services OT consult     Precautions / Restrictions Precautions Precautions: Fall Precaution Comments: Pt reports he has been unsteady on his feet for a while      Mobility  Bed Mobility Overal bed mobility: Modified Independent                Transfers Overall transfer level: Needs assistance Equipment used: None Transfers: Sit to/from Stand Sit to Stand: Min guard         General transfer comment: Unsteady at initial stand  Ambulation/Gait Ambulation/Gait assistance: Min assist Ambulation Distance (Feet): 80 Feet Assistive device: None;1 person hand held assist (occasional use of hallway rail) Gait Pattern/deviations: Narrow base of support     General Gait Details: Erratic step width and generally unsteady gait; will work with RW next session  Stairs            Wheelchair Mobility    Modified Rankin (Stroke Patients Only)       Balance Overall balance assessment: Needs assistance         Standing  balance support: No upper extremity supported Standing balance-Leahy Scale: Fair                               Pertinent Vitals/Pain Pain Assessment: 0-10 Pain Score: 8  Pain Location: L eye Pain Descriptors / Indicators: Aching Pain Intervention(s): Limited activity within patient's tolerance;Patient requesting pain meds-RN notified    Home Living Family/patient expects to be discharged to:: Private residence Living Arrangements: Non-relatives/Friends Available Help at Discharge: Friend(s);Other (Comment) (they work) Type of Home: House Home Access: Stairs to enter Entrance Stairs-Rails: Right Technical brewer of Steps: 6 Home Layout: One level Home Equipment: None      Prior Function Level of Independence: Independent         Comments: though pt reports he has been unsteady     Hand Dominance        Extremity/Trunk Assessment   Upper Extremity Assessment: Overall WFL for tasks assessed (for simple mobility tasks)           Lower Extremity Assessment: Generalized weakness (cachectic appearance/muscle wasting noted)         Communication   Communication: No difficulties  Cognition Arousal/Alertness: Awake/alert Behavior During Therapy: WFL for tasks assessed/performed Overall Cognitive Status: Within Functional Limits for tasks assessed                      General Comments  Exercises        Assessment/Plan    PT Assessment Patient needs continued PT services  PT Diagnosis     PT Problem List Decreased strength;Decreased activity tolerance;Decreased balance;Decreased mobility;Decreased coordination;Decreased knowledge of use of DME;Pain  PT Treatment Interventions DME instruction;Gait training;Stair training;Functional mobility training;Therapeutic activities;Therapeutic exercise;Balance training;Patient/family education   PT Goals (Current goals can be found in the Care Plan section) Acute Rehab PT Goals Patient  Stated Goal: wants less pain PT Goal Formulation: With patient Time For Goal Achievement: 02/03/14 Potential to Achieve Goals: Good    Frequency Min 3X/week   Barriers to discharge Decreased caregiver support      Co-evaluation               End of Session Equipment Utilized During Treatment: Gait belt Activity Tolerance: Patient tolerated treatment well Patient left: in bed;with call bell/phone within reach;with bed alarm set Nurse Communication: Mobility status         Time: 6004-5997 PT Time Calculation (min): 12 min   Charges:   PT Evaluation $Initial PT Evaluation Tier I: 1 Procedure     PT G CodesRoney Marion Hamff 01/20/2014, 5:17 PM  Roney Marion, [place] Pager [phone] Office [phone]

## 2014-01-21 ENCOUNTER — Inpatient Hospital Stay (HOSPITAL_COMMUNITY): Payer: Medicare Other

## 2014-01-21 LAB — GLUCOSE, CAPILLARY: GLUCOSE-CAPILLARY: 124 mg/dL — AB (ref 70–99)

## 2014-01-21 MED ORDER — ALTEPLASE 2 MG IJ SOLR
2.0000 mg | Freq: Once | INTRAMUSCULAR | Status: AC | PRN
  Filled 2014-01-21: qty 2

## 2014-01-21 MED ORDER — LIDOCAINE HCL (PF) 1 % IJ SOLN: 5.0000 mL | INTRAMUSCULAR | Status: DC | PRN

## 2014-01-21 MED ORDER — HYDROMORPHONE HCL 1 MG/ML IJ SOLN: 1.0000 mg | INTRAMUSCULAR | Status: DC | PRN

## 2014-01-21 MED ORDER — SODIUM CHLORIDE 0.9 % IV SOLN: 100.0000 mL | INTRAVENOUS | Status: DC | PRN

## 2014-01-21 MED ORDER — HYDROMORPHONE HCL 1 MG/ML IJ SOLN
0.5000 mg | INTRAMUSCULAR | Status: DC | PRN
  Administered 2014-01-21: 10:00:00 via INTRAVENOUS
  Administered 2014-01-21 – 2014-01-26 (×19): 0.5 mg via INTRAVENOUS
  Filled 2014-01-21 (×18): qty 1

## 2014-01-21 MED ORDER — NEPRO/CARBSTEADY PO LIQD: 237.0000 mL | ORAL | Status: DC | PRN

## 2014-01-21 MED ORDER — LIDOCAINE-PRILOCAINE 2.5-2.5 % EX CREA: 1.0000 "application " | TOPICAL_CREAM | CUTANEOUS | Status: DC | PRN

## 2014-01-21 MED ORDER — HEPARIN SODIUM (PORCINE) 1000 UNIT/ML DIALYSIS
1000.0000 [IU] | INTRAMUSCULAR | Status: DC | PRN
  Filled 2014-01-21: qty 1

## 2014-01-21 MED ORDER — LEVETIRACETAM 500 MG PO TABS
1000.0000 mg | ORAL_TABLET | Freq: Every day | ORAL | Status: DC
  Administered 2014-01-22 – 2014-01-28 (×7): 1000 mg via ORAL
  Filled 2014-01-21 (×8): qty 2

## 2014-01-21 MED ORDER — PENTAFLUOROPROP-TETRAFLUOROETH EX AERO: 1.0000 "application " | INHALATION_SPRAY | CUTANEOUS | Status: DC | PRN

## 2014-01-21 NOTE — Progress Notes (Signed)
Patient ID: [name], male   DOB: 08-10-1955, 58 y.o.   MRN: [id number]        Attending progress note    Date of Admission:  01/19/2014     Principal Problem:   Orbital cellulitis on left Active Problems:   Hypertension   Diabetes mellitus without complication   ESRD on hemodialysis   Seizure disorder   Normocytic anemia   Cocaine abuse   Tobacco abuse   . calcium acetate  1,334 mg Oral TID WC  . cefTRIAXone (ROCEPHIN)  IV  2 g Intravenous Q24H  . doxercalciferol  2 mcg Intravenous Q T,Th,Sa-HD  . [START ON 01/24/2014] ferric gluconate (FERRLECIT/NULECIT) IV  62.5 mg Intravenous Q Thu-HD  . heparin  5,000 Units Subcutaneous 3 times per day  . hydrALAZINE  100 mg Oral BID  . [START ON 01/22/2014] levETIRAcetam  1,000 mg Oral QHS  . multivitamin  1 tablet Oral QHS  . pantoprazole  40 mg Oral Daily  . sodium chloride  3 mL Intravenous Q12H  . sodium chloride  3 mL Intravenous Q12H  . [START ON 01/22/2014] vancomycin  500 mg Intravenous Q T,Th,Sa-HD    I have seen and examined [name] today with our team. The swelling around his left eye has improved but he continues to have severe pain, diplopia and restricted eye movements on the left. His visual acuity is poor but apparently unchanged with this recent illness. We will continue current empiric antibiotics for presumed orbital cellulitis and check MR angiogram of his orbit.  Michel Bickers, MD [facility] [phone] pager   [phone] cell 01/21/2014, 1:04 PM

## 2014-01-21 NOTE — Consult Note (Signed)
[name]                                                                               01/21/2014                                               Pediatric Ophthalmology Consultation                                         HPI Update: [name] continues to be afebrile and borderline leukopenic. He reports continued pain, worse overnight than during the day yesterday. He denies any vision decrease or new symptoms.  Pertinent Medical History:   Active Ambulatory Problems    Diagnosis Date Noted  . Hypertension   . Diabetes mellitus without complication   . ESRD on hemodialysis   . Seizure disorder   . Normocytic anemia 03/02/2012  . Cocaine abuse 03/02/2012  . Tobacco abuse 03/02/2012  . GI bleed 05/04/2012  . Malnutrition of moderate degree 01/03/2013  . Acute blood loss anemia 08/21/2013   Resolved Ambulatory Problems    Diagnosis Date Noted  . Hyperkalemia 03/02/2012  . Hyperphosphatemia 03/02/2012  . Hypocalcemia 03/02/2012  . Volume overload 03/02/2012  . Hemorrhage of rectum and anus 05/05/2012  . End stage renal disease 05/26/2012  . Anemia 11/07/2012  . Hypokalemia 11/07/2012  . Heme + stool 11/08/2012  . Microcytic anemia 08/21/2013   Past Medical History  Diagnosis Date  . Anemia, chronic disease   . CHF (congestive heart failure)   . Hepatitis C antibody test positive   . Hepatitis B core antibody positive   . Positive QuantiFERON-TB Gold test   . Helicobacter pylori gastritis   . Polyp of colon, adenomatous   . Shortness of breath   . Hematochezia   . Head injury, closed, with concussion   . History of blood transfusion   . Type II diabetes mellitus   . Arthritis   . Headache(784.0)   . Hypercalcemia   . Hyperpotassemia   . Seizures      Current Eye Medications: none  Current Medications:  . calcium acetate  1,334 mg Oral TID WC  . cefTRIAXone (ROCEPHIN)  IV  2 g Intravenous Q24H  . doxercalciferol  2 mcg Intravenous Q T,Th,Sa-HD  . [START ON  01/24/2014] ferric gluconate (FERRLECIT/NULECIT) IV  62.5 mg Intravenous Q Thu-HD  . heparin  5,000 Units Subcutaneous 3 times per day  . hydrALAZINE  100 mg Oral BID  . [START ON 01/22/2014] levETIRAcetam  1,000 mg Oral QHS  . multivitamin  1 tablet Oral QHS  . pantoprazole  40 mg Oral Daily  . sodium chloride  3 mL Intravenous Q12H  . sodium chloride  3 mL Intravenous Q12H  . [START ON 01/22/2014] vancomycin  500 mg Intravenous Q T,Th,Sa-HD     ROS: stable: significantly reduced tenderness of left brow and superficial orbital adnexa; pain with eye movements; inability to open left eye;  diplopia in most gazes except left and sometimes straight ahead  Visual Fields: FTC OU    Pupils:  Equal, sluggish reaction, no APD; still mid-dilated from prior dilation  Near acuity:   cc    OD  20/30- with +2.50      cc    OS  20/30- with +2.50    TA:       Normal to palpation OU      Dilation: Not dilated     External:   OD:  Normal      OS:  Nearly absent swelling over lacrimal gland and lateral brow with minimal tenderness to palpation; stable mild to moderate proptosis; slightly worsened restriction of globe, 4+ medially, 3+ superiorly and inferiorly, +1-2 laterally  Anterior segment exam:  By penlight   Conjunctiva:  OD:  Quiet     OS:  Quiet - no appreciable increase of injection compared with right  Cornea:    OD: Clear, arcus   OS: Clear, arcus  Anterior Chamber:   OD:  Deep/quiet     OS:  Deep/quiet    Iris:    OD:  Normal      OS:  Normal     Lens:    OD: 2-3+ NS       OS: 2-3+ NS      Impression:  47XM male with complicated medical history with painful proptosis of left eye; improving superficial picture of suspected dacryoadenitis on broad antibiotics and with one dose of steroids at admission, but worsening restriction of globe suspicious for progressing deep pathology and possible pupil-sparing CNIII involvement 1. Dacryoadenitis with possible component of preseptal  or early orbital cellulitis: lack of fever and redness but leukopenia and poor systemic health might make it difficult for patient to mount appropriate response.  - cannot rule out progressing orbital abcess or cavernous sinus process that would further restrict movement or   impinge CNIII  - any CNIII involvement importantly appears to be pupil-sparing (pupils currently equal and likely still recovering   from prior dilation) 2. 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   - would cause increasing restriction without steroids Less likely diagnoses: thyroid ophthalmopathy, sarcoidosis/Wegener's, mucormycosis, et al   Recommendations/Plan:  1. Continue antibiotics for possible infectious dacryoadenitis/abcess.  2. Recommend MRI/MRA of orbits/cavernous sinuses for evaluation of structures and possible abcess or thrombosis. 3. Recommend holding steroids until MRI results available for evaluation and to rule out infectious process.   I've discussed these findings with Dr. Redmond Pulling (late morning) and will plan to continue to follow and watch for MRI/MRA results with further recs as able. Please don't hesitate to contact me at our office with any questions or concerns at [phone].  [name]

## 2014-01-21 NOTE — Consult Note (Signed)
[name]                                                                               01/21/2014                                               Pediatric Ophthalmology Consultation                                         HPI Update: [name] continues to be afebrile and borderline leukopenic. He reports continued pain, worse overnight than during the day yesterday. He denies any vision decrease or new symptoms.  Pertinent Medical History:   Active Ambulatory Problems    Diagnosis Date Noted  . Hypertension   . Diabetes mellitus without complication   . ESRD on hemodialysis   . Seizure disorder   . Normocytic anemia 03/02/2012  . Cocaine abuse 03/02/2012  . Tobacco abuse 03/02/2012  . GI bleed 05/04/2012  . Malnutrition of moderate degree 01/03/2013  . Acute blood loss anemia 08/21/2013   Resolved Ambulatory Problems    Diagnosis Date Noted  . Hyperkalemia 03/02/2012  . Hyperphosphatemia 03/02/2012  . Hypocalcemia 03/02/2012  . Volume overload 03/02/2012  . Hemorrhage of rectum and anus 05/05/2012  . End stage renal disease 05/26/2012  . Anemia 11/07/2012  . Hypokalemia 11/07/2012  . Heme + stool 11/08/2012  . Microcytic anemia 08/21/2013   Past Medical History  Diagnosis Date  . Anemia, chronic disease   . CHF (congestive heart failure)   . Hepatitis C antibody test positive   . Hepatitis B core antibody positive   . Positive QuantiFERON-TB Gold test   . Helicobacter pylori gastritis   . Polyp of colon, adenomatous   . Shortness of breath   . Hematochezia   . Head injury, closed, with concussion   . History of blood transfusion   . Type II diabetes mellitus   . Arthritis   . Headache(784.0)   . Hypercalcemia   . Hyperpotassemia   . Seizures      Current Eye Medications: none  Current Medications:  . calcium acetate  1,334 mg Oral TID WC  . cefTRIAXone (ROCEPHIN)  IV  2 g Intravenous Q24H  . doxercalciferol  2 mcg Intravenous Q T,Th,Sa-HD  . [START ON  01/24/2014] ferric gluconate (FERRLECIT/NULECIT) IV  62.5 mg Intravenous Q Thu-HD  . heparin  5,000 Units Subcutaneous 3 times per day  . hydrALAZINE  100 mg Oral BID  . [START ON 01/22/2014] levETIRAcetam  1,000 mg Oral QHS  . multivitamin  1 tablet Oral QHS  . pantoprazole  40 mg Oral Daily  . sodium chloride  3 mL Intravenous Q12H  . sodium chloride  3 mL Intravenous Q12H  . [START ON 01/22/2014] vancomycin  500 mg Intravenous Q T,Th,Sa-HD     ROS: stable: significantly reduced tenderness of left brow and superficial orbital adnexa; pain with eye movements; inability to open left eye;  diplopia in most gazes except left and sometimes straight ahead  Visual Fields: FTC OU    Pupils:  Equal, sluggish reaction, no APD; still mid-dilated from prior dilation  Near acuity:   cc    OD  20/30- with +2.50      cc    OS  20/30- with +2.50    TA:       Normal to palpation OU      Dilation: Not dilated     External:   OD:  Normal      OS:  Nearly absent swelling over lacrimal gland and lateral brow with minimal tenderness to palpation; stable mild to moderate proptosis; slightly worsened restriction of globe, 4+ medially, 3+ superiorly and inferiorly, +1-2 laterally  Anterior segment exam:  By penlight   Conjunctiva:  OD:  Quiet     OS:  Quiet - no appreciable increase of injection compared with right  Cornea:    OD: Clear, arcus   OS: Clear, arcus  Anterior Chamber:   OD:  Deep/quiet     OS:  Deep/quiet    Iris:    OD:  Normal      OS:  Normal     Lens:    OD: 2-3+ NS       OS: 2-3+ NS      Impression:  14YQ male with complicated medical history with painful proptosis of left eye; improving superficial picture of suspected dacryoadenitis on broad antibiotics and with one dose of steroids at admission, but worsening restriction of globe suspicious for progressing deep pathology and possible pupil-sparing CNIII involvement 1. Dacryoadenitis with possible component of preseptal  or early orbital cellulitis: lack of fever and redness but leukopenia and poor systemic health might make it difficult for patient to mount appropriate response.  - cannot rule out progressing orbital abcess or cavernous sinus process that would further restrict movement or   impinge CNIII  - any CNIII involvement importantly appears to be pupil-sparing (pupils currently equal and likely still recovering   from prior dilation) 2. 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   - would cause increasing restriction without steroids Less likely diagnoses: thyroid ophthalmopathy, sarcoidosis/Wegener's, mucormycosis, et al   Recommendations/Plan:  1. Continue antibiotics for possible infectious dacryoadenitis/abcess.  2. Recommend MRI/MRA of orbits/cavernous sinuses for evaluation of structures and possible abcess or thrombosis. 3. Recommend holding steroids until MRI results available for evaluation and to rule out infectious process.   I've discussed these findings with Dr. Redmond Pulling (late morning) and will plan to continue to follow and watch for MRI/MRA results with further recs as able. Please don't hesitate to contact me at our office with any questions or concerns at [phone].  [name]  Addendum @ 6:18pm After reviewing MRI/MRA read, ophthalmology agrees with the radiology read - no apparent infectious process of the cavernous sinus or orbits; no evidence of mass. Significant inflammation of the perineural sheath of the left eye is consistent with optic perineuritis, a rare variant of orbital pseudotumor (there was no papilledema on examination). Treatments vary between pulse or standard high-dose-tapered regimens of steroids. Given the patient's tenuous systemis health including diabetic status, ophthalmology can recommend obtaining labs to rule out  other potential systemic causes for inflammation (ACE, ANA, syphilis screening test of choice, and ANCA) followed by initiation of high-dose steroids, at 60-80 mg/day to start with  a very slow taper over 1-2 months. Antibiotic continuation will be left to the teams discretion given the seeming resolution of the preseptal process.  Ophthalmology will followup with an exam tomorrow and will contact a member of the team immediately to discuss the new findings/recommendations.

## 2014-01-21 NOTE — Progress Notes (Addendum)
Subjective: Mr. Sliger was seen and examined this morning.   His left eye pain was worse overnight (15/10).  He says the pain increased at night.  His vision has not worsened.  He denies fever and his appetite is good.  Objective: Vital signs in last 24 hours: Filed Vitals:   01/20/14 1623 01/20/14 2107 01/21/14 0203 01/21/14 0501  BP: 175/147 172/81 155/66 158/91  Pulse: 71 72 71 71  Temp: 98.4 F (36.9 C) 98.4 F (36.9 C)  98.5 F (36.9 C)  TempSrc: Oral Oral  Oral  Resp: 16 17  16   Height:  5\' 9"  (1.753 m)    Weight:  59.557 kg (131 lb 4.8 oz)    SpO2: 98% 96%  100%   Weight change: 2.058 kg (4 lb 8.6 oz)  Intake/Output Summary (Last 24 hours) at 01/21/14 0732 Last data filed at 01/21/14 0300  Gross per 24 hour  Intake   1130 ml  Output      0 ml  Net   1130 ml   General: resting in bed in NAD HEENT: pupils are 4-6mm bilateral and NR, limited left eye adduction, left eye appears down and out, there is less swelling than at admission, he exhibits binocular diplopia, he reports equal blurry vision from both eyes and from the affected eye Cardiac: RRR, no rubs, murmurs or gallops Pulm: clear to auscultation bilaterally, moving normal volumes of air Abd: soft, nontender, nondistended, BS present Ext: warm and well perfused, no pedal edema Neuro: alert and oriented X3, responding appropriately, following commands  Medications: I have reviewed the patient's current medications. Scheduled Meds: . calcium acetate  1,334 mg Oral TID WC  . cefTRIAXone (ROCEPHIN)  IV  2 g Intravenous Q24H  . doxercalciferol  2 mcg Intravenous Q T,Th,Sa-HD  . [START ON 01/24/2014] ferric gluconate (FERRLECIT/NULECIT) IV  62.5 mg Intravenous Q Thu-HD  . heparin  5,000 Units Subcutaneous 3 times per day  . hydrALAZINE  100 mg Oral BID  . [START ON 01/22/2014] levETIRAcetam  1,000 mg Oral QHS  . multivitamin  1 tablet Oral QHS  . pantoprazole  40 mg Oral Daily  . sodium chloride  3 mL Intravenous  Q12H  . sodium chloride  3 mL Intravenous Q12H  . [START ON 01/22/2014] vancomycin  500 mg Intravenous Q T,Th,Sa-HD   Continuous Infusions:  PRN Meds:.sodium chloride, sodium chloride, sodium chloride, acetaminophen, acetaminophen, alteplase, calcium acetate, diphenhydrAMINE, feeding supplement (NEPRO CARB STEADY), heparin, HYDROcodone-acetaminophen, HYDROmorphone (DILAUDID) injection, lidocaine (PF), lidocaine-prilocaine, pentafluoroprop-tetrafluoroeth, sodium chloride  Assessment/Plan: Painful ophthalmoplegia of left eye:  No worsening of visual symptoms overnight but he reports increased pain overnight.  He still has limited eye movement on exam.  The ophthalmologist, Dr. Posey Pronto, has re-evaluated him this morning and their recommendations are appreciated.  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  The differential still includes early orbital cellulitis, cavernous sinus thrombosis and orbital pseudotumor.   - continue vancomycin and ceftriaxone for suspected early orbital cellulitis - MRI/MRA brain without contrast AND MRI orbits without contrast ordered (no gadolinium because he has ESRD) - continue prn Vicodin and ADD IV Dilaudid for pain control - appreciate Ophthalmology following along  New t-wave inversions inferior-lateral leads: New compared to prior EKGs.  I spoke with cardiologist who agree that this is most likely due to the patient's LVH.  The patient is asymptomatic.  No alarms on telemetry. -  continue cardiac monitoring   Hypertension: stable. --continue home hydralazine   Diabetes Mellitus: Hgb A1c 5.0 July 2014. Serum BG 165. [name] not currently on treatment.  Stable.  - check A1c, monitor CBG, no need for trx currently   ESRD on hemodialysis, TThS schedule: Last HD was 10/03 as an inpatient.   - HD per nephrology  Cocaine abuse: patient has a long history of  cocaine abuse with recent use last week.  - counseled on cessation   Tobacco abuse:  - counseled on cessation   Seizure disorder:  - continue home Keppra   VTE ppx: heparin   Diet: renal diet with 1200 mL fluid restriction   Dispo: Disposition is deferred at this time, awaiting improvement of current medical problems.  Anticipated discharge in approximately 1-2 day(s).   The patient does have a current PCP Windy Kalata, MD) and does not need an [facility] hospital follow-up appointment after discharge.  The patient does not know have transportation limitations that hinder transportation to clinic appointments.  .Services Needed at time of discharge: Y = Yes, Blank = No PT:   OT:   RN:   Equipment:   Other:     LOS: 2 days   Francesca Oman, DO 01/21/2014, 7:32 AM

## 2014-01-21 NOTE — Progress Notes (Signed)
I have seen and examined the patient, and reviewed the daily progress note by Josiah Lobo, MS4 and discussed the care of the patient with them. Please see my progress note from 01/21/2014 for further details regarding assessment and plan.    Signed:  Francesca Oman, DO 01/21/2014, 12:50 PM

## 2014-01-21 NOTE — Progress Notes (Signed)
OT Cancellation Note  Patient Details Name: [name] MRN: [id number] DOB: 16-Aug-1955   Cancelled Treatment:    Reason Eval/Treat Not Completed: Patient at procedure or test/ unavailable. Will continue to follow.  Malka So 01/21/2014, 2:08 PM [phone]

## 2014-01-21 NOTE — Progress Notes (Signed)
Subjective:  Left eye pain has worsened overnight, with swelling and clear discharge; no further nausea or vomiting  Objective: Vital signs in last 24 hours: Temp:  [98.3 F (36.8 C)-98.5 F (36.9 C)] 98.5 F (36.9 C) (10/05 0501) Pulse Rate:  [67-72] 71 (10/05 0501) Resp:  [16-17] 16 (10/05 0501) BP: (155-175)/(66-147) 158/91 mmHg (10/05 0501) SpO2:  [96 %-100 %] 100 % (10/05 0501) Weight:  [59.557 kg (131 lb 4.8 oz)] 59.557 kg (131 lb 4.8 oz) (10/04 2107) Weight change: 2.058 kg (4 lb 8.6 oz)  Intake/Output from previous day: 10/04 0701 - 10/05 0700 In: 1370 [P.O.:1320; IV Piggyback:50] Out: 0  Intake/Output this shift:   Lab Results:  Recent Labs  01/20/14 01/20/14 0503  WBC 3.4* 4.5  HGB 12.9* 12.4*  HCT 39.3 38.2*  PLT 218 208   BMET:  Recent Labs  01/20/14 01/20/14 0503  NA 132* 136*  K 3.6* 3.9  CL 91* 94*  CO2 26 29  GLUCOSE 228* 106*  BUN 16 20  CREATININE 5.47* 5.98*  CALCIUM 8.2* 8.2*  ALBUMIN 3.3* 3.3*   No results found for this basename: PTH,  in the last 72 hours Iron Studies: No results found for this basename: IRON, TIBC, TRANSFERRIN, FERRITIN,  in the last 72 hours  Studies/Results: Ct Maxillofacial W/cm  01/20/2014   CLINICAL DATA:  New onset of left orbital swelling 4 days ago without reported trauma ; dialysis dependent renal failure ; subsequent encounter  EXAM: CT MAXILLOFACIAL WITH CONTRAST  TECHNIQUE: Multidetector CT imaging of the maxillofacial structures was performed with intravenous contrast. Multiplanar CT image reconstructions were also generated. A small metallic BB was placed on the right temple in order to reliably differentiate right from left.  CONTRAST:  39mL OMNIPAQUE IOHEXOL 300 MG/ML  SOLN intravenous  COMPARISON:  Noncontrast CT scan of the brain of January 19, 2014  FINDINGS: The globes are intact. There is no significant pre or postseptal edema. The periorbital soft tissues exhibit no evidence of abscess nor significant  edema. The male are soft tissues are unremarkable.  The paranasal sinuses exhibit no air-fluid levels. There is a small amount of mucoperiosteal thickening in the left maxillary sinus. There is lucency within the posterior aspect of the left maxilla with small amount of increased density in the adjacent soft tissues along the lateral aspect of the alveolar ridge. A tiny focus of gas is demonstrated on image 42. The posterior-most molar appears to be partially destroyed. The temporomandibular joints exhibit no acute abnormality.  IMPRESSION: 1. There are no findings to suggest significant inflammatory changes of the orbit. The pre and postseptal soft tissues are fairly symmetric with those on the right. 2. There is abnormal soft tissue density along the external surface of the left maxilla posteriorly with destructive changes of a posterior molar. The findings suggest periodontal inflammation without discrete abscess. 3. No significant inflammatory change within the paranasal sinuses is demonstrated.   Electronically Signed   By: David  Martinique   On: 01/20/2014 08:49    EXAM: General appearance:  Alert, in mild distress, L eye swelling & tenderness Resp:  CTA without rales, rhonchi, or wheezes Cardio:  RRR without murmur or rub GI: + BS, soft and nontender Extremities:  No edema Access:  AVF @ LUA with + bruit  Dialysis Orders: TTS @ [facility]  58.5 kg 4 hrs 2K/2.25Ca 400/A1.5 No Heparin AVF @ LUA  Hectorol 2Aranesp 80 mcg 9/29 and Venofer 50 mg on Thurs.  Assessment/Plan: 1. Left  eye pain - still very painful; CT head with no acute abnormalities; on Vancomycin & Rocephin, followed by Ophthalmology and ID. 2. ESRD - HD on TTS @ [facility], K 3.9. HD tomorrow. 3. HTN/volume - BP 181/98 on Hydralazine 100 mg bid, also Irbesartan 150 mg qd as outpatient; wt 57.4 kg, below EDW, CXR negative. 4. Anemia - Hgb 12.4, Aranesp on hold, continue weekly Fe. 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 6. Nutrition - Alb 3.3, renal diet, multivitamin.  7. Hx Dm Type 2  8. Hepatitis C  9. Hx seizure disorder - on Keppra. 10. Hx cocaine abuse - admits smoking crack 3 days prior to admission.     LOS: 2 days   [name],Kaydan 01/21/2014,7:40 AM  Pt seen, examined and agree w A/P as above.  Kelly Splinter MD pager [phone]    cell [phone] 01/21/2014, 1:47 PM

## 2014-01-21 NOTE — Progress Notes (Signed)
Subjective: Reports that his L eye pain has worsened overnight despite receiving two doses of Vicodin. Would like something better for pain. Denies any worsening of vision since admission. Still reports double vision from his L eye if he closes his right eye.     Objective: Vital signs in last 24 hours: Filed Vitals:   01/20/14 2107 01/21/14 0203 01/21/14 0501 01/21/14 0922  BP: 172/81 155/66 158/91 173/78  Pulse: 72 71 71 70  Temp: 98.4 F (36.9 C)  98.5 F (36.9 C) 98.2 F (36.8 C)  TempSrc: Oral  Oral Oral  Resp: 17  16 18   Height: 5\' 9"  (1.753 m)     Weight: 59.557 kg (131 lb 4.8 oz)     SpO2: 96%  100% 100%   Weight change: 2.058 kg (4 lb 8.6 oz)  Intake/Output Summary (Last 24 hours) at 01/21/14 1135 Last data filed at 01/21/14 0700  Gross per 24 hour  Intake   1190 ml  Output      0 ml  Net   1190 ml   General: resting in bed in NAD  HEENT: less periorbital swelling on L compared to R from initial presentation; mild ptosis of [name]; L sided monocular diplopia, EOM in L eye continue to be limited. L and R eye pupils dilated 4-6 mm likely from dilating agent from optho's exam on 10/3 Cardiac: RRR, no rubs, murmurs or gallops  Pulm: clear to auscultation bilaterally, moving normal volumes of air  Abd: soft, nontender, nondistended, BS present  Ext: warm and well perfused, no pedal edema.  Neuro: alert and oriented X3, responding appropriately, following commands.   Lab Results: Basic Metabolic Panel:   Recent Labs  Lab  01/20/14  01/20/14 0503   NA  132*  136*   K  3.6*  3.9   CL  91*  94*   CO2  26  29   GLUCOSE  228*  106*   BUN  16  20   CREATININE  5.47*  5.98*   CALCIUM  8.2*  8.2*    Liver Function Tests:   Recent Labs  Lab  01/20/14  01/20/14 0503   AST  21  19   ALT  13  11   ALKPHOS  65  63   BILITOT  0.3  0.2*   PROT  8.4*  7.9   ALBUMIN  3.3*  3.3*    CBC:   Recent Labs  Lab  01/19/14 0756  01/20/14  01/20/14 0503   WBC  2.9*   3.4*  4.5   NEUTROABS  1.5*  --  --   HGB  14.5  12.9*  12.4*   HCT  43.1  39.3  38.2*   MCV  85.7  86.6  87.2   PLT  219  218  208    Cardiac Enzymes:   Recent Labs  Lab  01/19/14 1310  01/19/14 1937  01/20/14   TROPONINI  <0.30  <0.30  <0.30    CBG:   Recent Labs  Lab  01/20/14 0819   GLUCAP  101*    Micro Results: Recent Results (from the past 240 hour(s))  CULTURE, BLOOD (ROUTINE X 2)     Status: None   Collection Time    01/19/14  7:35 PM      Result Value Ref Range Status   Specimen Description BLOOD RIGHT ARM   Final   Special Requests BOTTLES DRAWN AEROBIC ONLY 10 CC  Final   Culture  Setup Time     Final   Value: 01/20/2014 00:48     Performed at [facility]   Culture     Final   Value:        BLOOD CULTURE RECEIVED NO GROWTH TO DATE CULTURE WILL BE HELD FOR 5 DAYS BEFORE ISSUING A FINAL NEGATIVE REPORT     Performed at [facility]   Report Status PENDING   Incomplete  CULTURE, BLOOD (ROUTINE X 2)     Status: None   Collection Time    01/19/14  7:37 PM      Result Value Ref Range Status   Specimen Description BLOOD RIGHT ARM   Final   Special Requests BOTTLES DRAWN AEROBIC AND ANAEROBIC 10 CC   Final   Culture  Setup Time     Final   Value: 01/20/2014 00:48     Performed at [facility]   Culture     Final   Value:        BLOOD CULTURE RECEIVED NO GROWTH TO DATE CULTURE WILL BE HELD FOR 5 DAYS BEFORE ISSUING A FINAL NEGATIVE REPORT     Performed at [facility]   Report Status PENDING   Incomplete   Studies/Results: Ct Maxillofacial W/cm  01/20/2014   CLINICAL DATA:  New onset of left orbital swelling 4 days ago without reported trauma ; dialysis dependent renal failure ; subsequent encounter  EXAM: CT MAXILLOFACIAL WITH CONTRAST  TECHNIQUE: Multidetector CT imaging of the maxillofacial structures was performed with intravenous contrast. Multiplanar CT image reconstructions were also generated. A small metallic BB was  placed on the right temple in order to reliably differentiate right from left.  CONTRAST:  24mL OMNIPAQUE IOHEXOL 300 MG/ML  SOLN intravenous  COMPARISON:  Noncontrast CT scan of the brain of January 19, 2014  FINDINGS: The globes are intact. There is no significant pre or postseptal edema. The periorbital soft tissues exhibit no evidence of abscess nor significant edema. The male are soft tissues are unremarkable.  The paranasal sinuses exhibit no air-fluid levels. There is a small amount of mucoperiosteal thickening in the left maxillary sinus. There is lucency within the posterior aspect of the left maxilla with small amount of increased density in the adjacent soft tissues along the lateral aspect of the alveolar ridge. A tiny focus of gas is demonstrated on image 42. The posterior-most molar appears to be partially destroyed. The temporomandibular joints exhibit no acute abnormality.  IMPRESSION: 1. There are no findings to suggest significant inflammatory changes of the orbit. The pre and postseptal soft tissues are fairly symmetric with those on the right. 2. There is abnormal soft tissue density along the external surface of the left maxilla posteriorly with destructive changes of a posterior molar. The findings suggest periodontal inflammation without discrete abscess. 3. No significant inflammatory change within the paranasal sinuses is demonstrated.   Electronically Signed   By: David  Martinique   On: 01/20/2014 08:49   Medications:  Scheduled Meds: . calcium acetate  1,334 mg Oral TID WC  . cefTRIAXone (ROCEPHIN)  IV  2 g Intravenous Q24H  . doxercalciferol  2 mcg Intravenous Q T,Th,Sa-HD  . [START ON 01/24/2014] ferric gluconate (FERRLECIT/NULECIT) IV  62.5 mg Intravenous Q Thu-HD  . heparin  5,000 Units Subcutaneous 3 times per day  . hydrALAZINE  100 mg Oral BID  . [START ON 01/22/2014] levETIRAcetam  1,000 mg Oral QHS  . multivitamin  1 tablet Oral QHS  . pantoprazole  40 mg Oral Daily  .  sodium chloride  3 mL Intravenous Q12H  . sodium chloride  3 mL Intravenous Q12H  . [START ON 01/22/2014] vancomycin  500 mg Intravenous Q T,Th,Sa-HD   Continuous Infusions:  PRN Meds:.sodium chloride, sodium chloride, sodium chloride, acetaminophen, acetaminophen, alteplase, calcium acetate, diphenhydrAMINE, feeding supplement (NEPRO CARB STEADY), heparin, HYDROcodone-acetaminophen, HYDROmorphone (DILAUDID) injection, lidocaine (PF), lidocaine-prilocaine, pentafluoroprop-tetrafluoroeth, sodium chloride  Assessment/Plan: Principal Problem:   Orbital cellulitis on left Active Problems:   Hypertension   Diabetes mellitus without complication   ESRD on hemodialysis   Seizure disorder   Cocaine abuse   Tobacco abuse  Painful ophthalmoplegia of left eye: Differential continues to include dactyoadenitis with component of orbital/preseptal cellulitis as patient is improving in terms of decreased swelling since initiation of antibiotics. Optho re-evaluated 10/5 and given patient's increased pain and minimal improvement in eye symptoms, would like to pursue MRI MRA of head and orbit to evaluate for cavernous sinus thrombosis. CT maxillary from 10/4 noted a soft density at L maxilla eye with destructive changes of posterior molar, suggesting peridontal inflammation and with a further discussion with rradiologist on 10/5, there are likely other abnormalities in his oral cavity including dental caries in his R molars and loose dental roots  and given this, one can consider this serving as nidus of infection for CVT though patient denies any dental pain or claudication - continue to appreciate ophthalmology recommendations  - continue vancomycin and ceftriaxone for suspected early orbital cellulitis vs cavernous sinus thrombosis  - continue Tylenol PRN pain  - continue Norco/Vicodin 5-325 mg q4h PRN pain  - Started 0.5 Dilaudid IV q4h PRN pain  - hold steroids given unlikelihood of GCA  [ ]  f/u imaging  for cavernous venous thrombosis   New t-wave inversions inferior-lateral leads: New compared to prior EKGs. These changes persist on AM EKG today. Troponins have been negative and he denies chest pain making ACS less likely. This may be related to LVH. [name] in NSR with no alarms on telemetry, however tele shows ST depression in lead II. I spoke with on-call Cardiology who reviewed the patient's EKG and ECHO with me and agrees that these changes are most likely due to LVH, especially given ECHO findings of LVH, HTN hx and the patient's lack of symptoms.  - continue cardiac monitoring   Hypertension: stable.  --continue home hydralazine   Diabetes Mellitus: Hgb A1c 5.0 July 2014. Serum BG 165. [name] not currently on treatment. Stable.  - check A1c, monitor CBG, no need for trx currently   ESRD on hemodialysis, TThS schedule: Last HD was 10/03 as an inpatient.  - HD per nephrology   Cocaine abuse: patient has a long history of cocaine abuse with recent use being about three days ago.  - counseled on cessation   Tobacco abuse:  - counseled on cessation   Seizure disorder:  - continue home Keppra   VTE ppx: heparin   Diet: renal diet with 1200 mL fluid restriction   This is a Careers information officer Note.  The care of the patient was discussed with Dr. Megan Salon and Dr. Redmond Pulling  and the assessment and plan formulated with their assistance.  Please see their attached note for official documentation of the daily encounter.   LOS: 2 days   Josiah Lobo, Med Student 01/21/2014, 11:35 AM

## 2014-01-22 DIAGNOSIS — H469 Unspecified optic neuritis: Secondary | ICD-10-CM

## 2014-01-22 LAB — RENAL FUNCTION PANEL
ALBUMIN: 3.4 g/dL — AB (ref 3.5–5.2)
Anion gap: 17 — ABNORMAL HIGH (ref 5–15)
BUN: 44 mg/dL — AB (ref 6–23)
CO2: 25 mEq/L (ref 19–32)
Calcium: 8.4 mg/dL (ref 8.4–10.5)
Chloride: 89 mEq/L — ABNORMAL LOW (ref 96–112)
Creatinine, Ser: 10.29 mg/dL — ABNORMAL HIGH (ref 0.50–1.35)
GFR calc non Af Amer: 5 mL/min — ABNORMAL LOW (ref >90)
GFR, EST AFRICAN AMERICAN: 6 mL/min — AB (ref >90)
Glucose, Bld: 93 mg/dL (ref 70–99)
PHOSPHORUS: 6.5 mg/dL — AB (ref 2.3–4.6)
POTASSIUM: 4.1 meq/L (ref 3.7–5.3)
SODIUM: 131 meq/L — AB (ref 137–147)

## 2014-01-22 LAB — CBC
HCT: 37.9 % — ABNORMAL LOW (ref 39.0–52.0)
Hemoglobin: 12.6 g/dL — ABNORMAL LOW (ref 13.0–17.0)
MCH: 28.8 pg (ref 26.0–34.0)
MCHC: 33.2 g/dL (ref 30.0–36.0)
MCV: 86.7 fL (ref 78.0–100.0)
Platelets: 204 10*3/uL (ref 150–400)
RBC: 4.37 MIL/uL (ref 4.22–5.81)
RDW: 16.1 % — ABNORMAL HIGH (ref 11.5–15.5)
WBC: 3.9 10*3/uL — ABNORMAL LOW (ref 4.0–10.5)

## 2014-01-22 LAB — RPR

## 2014-01-22 MED ORDER — PROMETHAZINE HCL 25 MG PO TABS
12.5000 mg | ORAL_TABLET | Freq: Four times a day (QID) | ORAL | Status: DC | PRN
  Administered 2014-01-22: 12.5 mg via ORAL

## 2014-01-22 MED ORDER — HYDROCODONE-ACETAMINOPHEN 5-325 MG PO TABS
ORAL_TABLET | ORAL | Status: AC
  Filled 2014-01-22: qty 1

## 2014-01-22 MED ORDER — DEXTROSE 5 % IV SOLN
1.0000 g | INTRAVENOUS | Status: DC
  Filled 2014-01-22: qty 10

## 2014-01-22 MED ORDER — DOXERCALCIFEROL 4 MCG/2ML IV SOLN
INTRAVENOUS | Status: AC
  Administered 2014-01-22: 2 ug via INTRAVENOUS
  Filled 2014-01-22: qty 2

## 2014-01-22 MED ORDER — METHYLPREDNISOLONE SODIUM SUCC 125 MG IJ SOLR
60.0000 mg | Freq: Every day | INTRAMUSCULAR | Status: DC
  Administered 2014-01-22 – 2014-01-23 (×2): 60 mg via INTRAVENOUS
  Administered 2014-01-24: 13:00:00 via INTRAVENOUS
  Administered 2014-01-25: 60 mg via INTRAVENOUS
  Filled 2014-01-22 (×3): qty 0.96
  Filled 2014-01-22: qty 2
  Filled 2014-01-22 (×2): qty 0.96

## 2014-01-22 MED ORDER — PROMETHAZINE HCL 25 MG PO TABS
ORAL_TABLET | ORAL | Status: AC
  Administered 2014-01-22: 12.5 mg via ORAL
  Filled 2014-01-22: qty 1

## 2014-01-22 NOTE — Progress Notes (Signed)
Subjective:  Swelling improved, but still with left eye pain and limited movement; nausea and vomiting this morning  Objective: Vital signs in last 24 hours: Temp:  [98 F (36.7 C)-98.6 F (37 C)] 98 F (36.7 C) (10/06 0715) Pulse Rate:  [70-91] 73 (10/06 0930) Resp:  [16-20] 20 (10/06 0759) BP: (79-227)/(63-116) 137/76 mmHg (10/06 0930) SpO2:  [95 %-100 %] 96 % (10/06 0715) Weight:  [58.514 kg (129 lb)] 58.514 kg (129 lb) (10/05 2102) Weight change: -1.043 kg (-2 lb 4.8 oz)  Intake/Output from previous day: 10/05 0701 - 10/06 0700 In: 290 [P.O.:240; IV Piggyback:50] Out: -  Intake/Output this shift:   Lab Results:  Recent Labs  01/20/14 0503 01/22/14 0500  WBC 4.5 3.9*  HGB 12.4* 12.6*  HCT 38.2* 37.9*  PLT 208 204   BMET:  Recent Labs  01/20/14 0503 01/22/14 0500  NA 136* 131*  K 3.9 4.1  CL 94* 89*  CO2 29 25  GLUCOSE 106* 93  BUN 20 44*  CREATININE 5.98* 10.29*  CALCIUM 8.2* 8.4  ALBUMIN 3.3* 3.4*   No results found for this basename: PTH,  in the last 72 hours Iron Studies: No results found for this basename: IRON, TIBC, TRANSFERRIN, FERRITIN,  in the last 72 hours  Studies/Results: Mr Virgel Paling Wo Contrast  01/21/2014   CLINICAL DATA:  Diabetic with LEFT proptosis.  EXAM: MRA HEAD WITHOUT CONTRAST  TECHNIQUE: Angiographic images of the Circle of Willis were obtained using MRA technique without intravenous contrast.  COMPARISON:  CT head 01/19/2014. CT maxillofacial 01/20/2014. MR of the head and orbits reported separately.  FINDINGS: The LEFT internal carotid artery is widely patent.  The RIGHT internal carotid artery demonstrates long segment 50% stenosis in its inferior cavernous segment. There is a similar 50% stenosis in the cavernous/supraclinoid junction. Both ICA termini are widely patent.  Basilar artery widely patent with vertebrals both contributing, RIGHT dominant. No proximal stenosis of the RIGHT or LEFT middle cerebral artery. No MCA branch  occlusion.  Both posterior cerebral arteries widely patent in their proximal segments. Some mild irregularity in the LEFT P3 PCA segment distal to the ambient cistern.  Moderate irregularity of the RIGHT A1 ACA. The LEFT A1 ACA is robust and supplies both anterior cerebrals as an azygos A2.  IMPRESSION: Non stenotic atheromatous change of the skull base and cavernous RIGHT internal carotid artery, likely manifestation of diabetic type atherosclerotic disease. Similar moderate irregularity intracranially affecting the proximal RIGHT anterior cerebral.  No flow reducing lesion of the carotid or basilar arteries is identified.   Electronically Signed   By: Rolla Flatten M.D.   On: 01/21/2014 15:41   Mr Brain Wo Contrast  01/21/2014   CLINICAL DATA:  Left eye pain and blurred vision for 3 days. Left eye drainage. Left-sided frontal headache. Diabetes and dialysis.  EXAM: MRI HEAD AND ORBITS WITHOUT CONTRAST  TECHNIQUE: Multiplanar, multiecho pulse sequences of the brain and surrounding structures were obtained without intravenous contrast. Multiplanar, multiecho pulse sequences of the orbits and surrounding structures were obtained including fat saturation techniques, without intravenous contrast administration.  : COMPARISON:  CT head 01/19/2014  FINDINGS: MRI HEAD FINDINGS  Mild atrophy. Moderate chronic ischemic changes throughout the cerebral white matter. 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 Extensive chronic ischemia in the pons. Cerebellum intact.  Negative for acute infarct.  Chronic hemorrhage in the right frontal lobe along the surface of the brain possibly due to prior subarachnoid hemorrhage or trauma.  No mass or edema identified.  MRI ORBITS FINDINGS  Mild left-sided proptosis. The globe is normal in shape and size and is symmetric. Extraocular muscles are normal. No edema in the orbital fat.  The optic nerve sheath is mildly dilated on the left. Question papilledema. Optic nerve  is normal in signal bilaterally. Lacrimal gland is normal. No mass is present within the orbit. No edema in the eyelid to suggest orbital cellulitis. Negative for abscess. Paranasal sinuses reveal minimal mucosal edema in the base of left maxillary sinus.  IMPRESSION: Atrophy and moderately severe chronic microvascular ischemia. No acute infarct.  Chronic hemorrhage right frontal lobe, question prior trauma  Mild left-sided proptosis without mass lesion. Mildly distended optic nerve sheath on the left, question papilledema. No underlying mass edema or abscess identified   Electronically Signed   By: Franchot Gallo M.D.   On: 01/21/2014 15:51   Mr Farley Ly Cm  01/21/2014   CLINICAL DATA:  Left eye pain and blurred vision for 3 days. Left eye drainage. Left-sided frontal headache. Diabetes and dialysis.  EXAM: MRI HEAD AND ORBITS WITHOUT CONTRAST  TECHNIQUE: Multiplanar, multiecho pulse sequences of the brain and surrounding structures were obtained without intravenous contrast. Multiplanar, multiecho pulse sequences of the orbits and surrounding structures were obtained including fat saturation techniques, without intravenous contrast administration.  : COMPARISON:  CT head 01/19/2014  FINDINGS: MRI HEAD FINDINGS  Mild atrophy. Moderate chronic ischemic changes throughout the cerebral white matter. 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 Extensive chronic ischemia in the pons. Cerebellum intact.  Negative for acute infarct.  Chronic hemorrhage in the right frontal lobe along the surface of the brain possibly due to prior subarachnoid hemorrhage or trauma. No mass or edema identified.  MRI ORBITS FINDINGS  Mild left-sided proptosis. The globe is normal in shape and size and is symmetric. Extraocular muscles are normal. No edema in the orbital fat.  The optic nerve sheath is mildly dilated on the left. Question papilledema. Optic nerve is normal in signal bilaterally. Lacrimal gland is normal.  No mass is present within the orbit. No edema in the eyelid to suggest orbital cellulitis. Negative for abscess. Paranasal sinuses reveal minimal mucosal edema in the base of left maxillary sinus.  IMPRESSION: Atrophy and moderately severe chronic microvascular ischemia. No acute infarct.  Chronic hemorrhage right frontal lobe, question prior trauma  Mild left-sided proptosis without mass lesion. Mildly distended optic nerve sheath on the left, question papilledema. No underlying mass edema or abscess identified   Electronically Signed   By: Franchot Gallo M.D.   On: 01/21/2014 15:51   EXAM:  General appearance: Alert, in no apparent distress, L eye tenderness, swelling better  Resp: CTA without rales, rhonchi, or wheezes  Cardio: RRR without murmur or rub  GI: + BS, soft and nontender  Extremities: No edema  Access: AVF @ LUA with BFR 400  Dialysis Orders: TTS @ [facility]  58.5 kg 4 hrs 2K/2.25Ca 400/A1.5 No Heparin AVF @ LUA  Hectorol 2Aranesp 80 mcg 9/29 and Venofer 50 mg on Thurs.  Assessment/Plan: 1. Left eye pain - still very painful, swelling better; on Vancomycin & Rocephin, MRI done yesterday, per Ophthalmology and ID.  2. ESRD - HD on TTS @ [facility], K 4.1. HD today. 3. HTN/volume - BP 159/86 on Hydralazine 100 mg bid, also Irbesartan 150 mg qd as outpatient; wt 58.5 kg, @ EDW, but attempting 2 L today. 4. Anemia - Hgb 12.6, Aranesp on hold,  continue weekly Fe.  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  6. Nutrition - Alb 3.4, renal diet, multivitamin.  7. Hx Dm Type 2  8. Hepatitis C  9. Hx seizure disorder - on Keppra.  10. Hx cocaine abuse - admits smoking crack 3 days prior to admission.    LOS: 3 days   [name],Jaman 01/22/2014,10:05 AM  Pt seen, examined and agree w A/P as above.  Kelly Splinter MD pager [phone]    cell [phone] 01/22/2014, 10:39 AM

## 2014-01-22 NOTE — Progress Notes (Signed)
Subjective: He continues to have diplopia, severe pain and restricted eye movement of the left eye. He had nausea and vomiting this morning but feels better now. He was dizzy after HD but this improved with rest.    Objective: Vital signs in last 24 hours: Filed Vitals:   01/22/14 1105 01/22/14 1135 01/22/14 1150 01/22/14 1242  BP: 150/83 142/80 141/84 159/72  Pulse: 72 72 72 74  Temp:   98.1 F (36.7 C) 98 F (36.7 C)  TempSrc:   Oral Oral  Resp:   18 18  Height:      Weight:   126 lb 5.2 oz (57.3 kg)   SpO2:    98%   Weight change: -2 lb 4.8 oz (-1.043 kg)  Intake/Output Summary (Last 24 hours) at 01/22/14 1448 Last data filed at 01/22/14 1322  Gross per 24 hour  Intake    410 ml  Output   2300 ml  Net  -1890 ml   Vitals reviewed. General: resting in bed, in NAD HEENT: Pupils are 4-78mm bilateral and NR, limited left eye adduction, left eye appears down and out, there is minimum swelling Cardiac: RRR, no rubs, murmurs or gallops Pulm: clear to auscultation bilaterally, no wheezes, rales, or rhonchi Abd: soft, nontender, nondistended, BS present Ext: warm and well perfused, no pedal edema Neuro: alert and oriented X3, follows commands appropriately  Lab Results: Basic Metabolic Panel:  Recent Labs Lab 01/20/14 0503 01/22/14 0500  NA 136* 131*  K 3.9 4.1  CL 94* 89*  CO2 29 25  GLUCOSE 106* 93  BUN 20 44*  CREATININE 5.98* 10.29*  CALCIUM 8.2* 8.4  PHOS  --  6.5*   Liver Function Tests:  Recent Labs Lab 01/20/14 01/20/14 0503 01/22/14 0500  AST 21 19  --   ALT 13 11  --   ALKPHOS 65 63  --   BILITOT 0.3 0.2*  --   PROT 8.4* 7.9  --   ALBUMIN 3.3* 3.3* 3.4*   CBC:  Recent Labs Lab 01/19/14 0756  01/20/14 0503 01/22/14 0500  WBC 2.9*  < > 4.5 3.9*  NEUTROABS 1.5*  --   --   --   HGB 14.5  < > 12.4* 12.6*  HCT 43.1  < > 38.2* 37.9*  MCV 85.7  < > 87.2 86.7  PLT 219  < > 208 204  < > = values in this interval not displayed. Cardiac  Enzymes:  Recent Labs Lab 01/19/14 1310 01/19/14 1937 01/20/14  TROPONINI <0.30 <0.30 <0.30   CBG:  Recent Labs Lab 01/20/14 0819 01/21/14 0752  GLUCAP 101* 124*   Urine Drug Screen: Drugs of Abuse     Component Value Date/Time   LABOPIA NONE DETECTED 08/24/2013 2115   COCAINSCRNUR POSITIVE* 08/24/2013 2115   LABBENZ NONE DETECTED 08/24/2013 2115   AMPHETMU NONE DETECTED 08/24/2013 2115   THCU NONE DETECTED 08/24/2013 2115   LABBARB NONE DETECTED 08/24/2013 2115     Micro Results: Recent Results (from the past 240 hour(s))  CULTURE, BLOOD (ROUTINE X 2)     Status: None   Collection Time    01/19/14  7:35 PM      Result Value Ref Range Status   Specimen Description BLOOD RIGHT ARM   Final   Special Requests BOTTLES DRAWN AEROBIC ONLY 10 CC   Final   Culture  Setup Time     Final   Value: 01/20/2014 00:48     Performed at [facility]   Culture     Final   Value:        BLOOD CULTURE RECEIVED NO GROWTH TO DATE CULTURE WILL BE HELD FOR 5 DAYS BEFORE ISSUING A FINAL NEGATIVE REPORT     Performed at [facility]   Report Status PENDING   Incomplete  CULTURE, BLOOD (ROUTINE X 2)     Status: None   Collection Time    01/19/14  7:37 PM      Result Value Ref Range Status   Specimen Description BLOOD RIGHT ARM   Final   Special Requests BOTTLES DRAWN AEROBIC AND ANAEROBIC 10 CC   Final   Culture  Setup Time     Final   Value: 01/20/2014 00:48     Performed at [facility]   Culture     Final   Value:        BLOOD CULTURE RECEIVED NO GROWTH TO DATE CULTURE WILL BE HELD FOR 5 DAYS BEFORE ISSUING A FINAL NEGATIVE REPORT     Performed at [facility]   Report Status PENDING   Incomplete   Studies/Results: Mr Virgel Paling Wo Contrast  01/21/2014   CLINICAL DATA:  Diabetic with LEFT proptosis.  EXAM: MRA HEAD WITHOUT CONTRAST  TECHNIQUE: Angiographic images of the Circle of Willis were obtained using MRA technique without intravenous contrast.  COMPARISON:   CT head 01/19/2014. CT maxillofacial 01/20/2014. MR of the head and orbits reported separately.  FINDINGS: The LEFT internal carotid artery is widely patent.  The RIGHT internal carotid artery demonstrates long segment 50% stenosis in its inferior cavernous segment. There is a similar 50% stenosis in the cavernous/supraclinoid junction. Both ICA termini are widely patent.  Basilar artery widely patent with vertebrals both contributing, RIGHT dominant. No proximal stenosis of the RIGHT or LEFT middle cerebral artery. No MCA branch occlusion.  Both posterior cerebral arteries widely patent in their proximal segments. Some mild irregularity in the LEFT P3 PCA segment distal to the ambient cistern.  Moderate irregularity of the RIGHT A1 ACA. The LEFT A1 ACA is robust and supplies both anterior cerebrals as an azygos A2.  IMPRESSION: Non stenotic atheromatous change of the skull base and cavernous RIGHT internal carotid artery, likely manifestation of diabetic type atherosclerotic disease. Similar moderate irregularity intracranially affecting the proximal RIGHT anterior cerebral.  No flow reducing lesion of the carotid or basilar arteries is identified.   Electronically Signed   By: Rolla Flatten M.D.   On: 01/21/2014 15:41   Mr Brain Wo Contrast  01/21/2014   CLINICAL DATA:  Left eye pain and blurred vision for 3 days. Left eye drainage. Left-sided frontal headache. Diabetes and dialysis.  EXAM: MRI HEAD AND ORBITS WITHOUT CONTRAST  TECHNIQUE: Multiplanar, multiecho pulse sequences of the brain and surrounding structures were obtained without intravenous contrast. Multiplanar, multiecho pulse sequences of the orbits and surrounding structures were obtained including fat saturation techniques, without intravenous contrast administration.  : COMPARISON:  CT head 01/19/2014  FINDINGS: MRI HEAD FINDINGS  Mild atrophy. Moderate chronic ischemic changes throughout the cerebral white matter. 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 Extensive chronic ischemia in the pons. Cerebellum intact.  Negative for acute infarct.  Chronic hemorrhage in the right frontal lobe along the surface of the brain possibly due to prior subarachnoid hemorrhage or trauma. No mass or edema identified.  MRI ORBITS FINDINGS  Mild left-sided proptosis. The globe is normal in shape and size and is  symmetric. Extraocular muscles are normal. No edema in the orbital fat.  The optic nerve sheath is mildly dilated on the left. Question papilledema. Optic nerve is normal in signal bilaterally. Lacrimal gland is normal. No mass is present within the orbit. No edema in the eyelid to suggest orbital cellulitis. Negative for abscess. Paranasal sinuses reveal minimal mucosal edema in the base of left maxillary sinus.  IMPRESSION: Atrophy and moderately severe chronic microvascular ischemia. No acute infarct.  Chronic hemorrhage right frontal lobe, question prior trauma  Mild left-sided proptosis without mass lesion. Mildly distended optic nerve sheath on the left, question papilledema. No underlying mass edema or abscess identified   Electronically Signed   By: Franchot Gallo M.D.   On: 01/21/2014 15:51   Mr Farley Ly Cm  01/21/2014   CLINICAL DATA:  Left eye pain and blurred vision for 3 days. Left eye drainage. Left-sided frontal headache. Diabetes and dialysis.  EXAM: MRI HEAD AND ORBITS WITHOUT CONTRAST  TECHNIQUE: Multiplanar, multiecho pulse sequences of the brain and surrounding structures were obtained without intravenous contrast. Multiplanar, multiecho pulse sequences of the orbits and surrounding structures were obtained including fat saturation techniques, without intravenous contrast administration.  : COMPARISON:  CT head 01/19/2014  FINDINGS: MRI HEAD FINDINGS  Mild atrophy. Moderate chronic ischemic changes throughout the cerebral white matter. 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 Extensive chronic ischemia in  the pons. Cerebellum intact.  Negative for acute infarct.  Chronic hemorrhage in the right frontal lobe along the surface of the brain possibly due to prior subarachnoid hemorrhage or trauma. No mass or edema identified.  MRI ORBITS FINDINGS  Mild left-sided proptosis. The globe is normal in shape and size and is symmetric. Extraocular muscles are normal. No edema in the orbital fat.  The optic nerve sheath is mildly dilated on the left. Question papilledema. Optic nerve is normal in signal bilaterally. Lacrimal gland is normal. No mass is present within the orbit. No edema in the eyelid to suggest orbital cellulitis. Negative for abscess. Paranasal sinuses reveal minimal mucosal edema in the base of left maxillary sinus.  IMPRESSION: Atrophy and moderately severe chronic microvascular ischemia. No acute infarct.  Chronic hemorrhage right frontal lobe, question prior trauma  Mild left-sided proptosis without mass lesion. Mildly distended optic nerve sheath on the left, question papilledema. No underlying mass edema or abscess identified   Electronically Signed   By: Franchot Gallo M.D.   On: 01/21/2014 15:51   Medications: I have reviewed the patient's current medications. Scheduled Meds: . calcium acetate  1,334 mg Oral TID WC  . cefTRIAXone (ROCEPHIN)  IV  1 g Intravenous Q24H  . doxercalciferol  2 mcg Intravenous Q T,Th,Sa-HD  . [START ON 01/24/2014] ferric gluconate (FERRLECIT/NULECIT) IV  62.5 mg Intravenous Q Thu-HD  . heparin  5,000 Units Subcutaneous 3 times per day  . hydrALAZINE  100 mg Oral BID  . levETIRAcetam  1,000 mg Oral QHS  . multivitamin  1 tablet Oral QHS  . pantoprazole  40 mg Oral Daily  . sodium chloride  3 mL Intravenous Q12H  . sodium chloride  3 mL Intravenous Q12H  . vancomycin  500 mg Intravenous Q T,Th,Sa-HD   Continuous Infusions:  PRN Meds:.sodium chloride, acetaminophen, acetaminophen, calcium acetate, diphenhydrAMINE, HYDROcodone-acetaminophen, HYDROmorphone  (DILAUDID) injection, promethazine, sodium chloride Assessment/Plan: 58 year old man with PMH of DM2, ESRD on HD, HTN, cocaine abuse, seizure disorder, presenting with painful ophthalmoplegia of the left eye.  Painful ophthalmoplegia  of the left eye: The etiology is still unclear though early orbital cellulitis and cavernous sinus thrombosis are still in the differential. Per Dr. Posey Pronto, in Opthalmology, who has evaluated the pt, optic perineuritis and systemic inflammatory causes also possible. MRI/MRA head revealed no apparent mass or infectious process but with mildly distended optic nerve sheath on the left eye.  -Appreciate Dr. Serita Grit recommendations: ordered ANA, ACE, RPR, ANCA. Will start high steroid tx with solumedrol IV 60mg , will need PO steroid with slow taper over 30-60 days.  -Will discontinue antibiotics per Dr. Hale Bogus recommendations -Continue Vicodin PRN and Dilaudid IV PRN for pain  ESRD: On HD on T/Th/Sat. Had HD today.  -renal function panel in am  HTN: Stable. Continue home hydralazine   DM2: Diet controlled. BS of 100-93.   Seizure disorder: Stable. Continue home [place].   Tobacco use: counseled on cessation, declines nicotine patch.   VTE prophylaxis: Heparin TID   FEN:  NSL Renal panel in am Renal diet   Dispo: Disposition is deferred at this time, awaiting improvement of current medical problems.  Anticipated discharge in approximately 1-2 day(s). Needs PT evaluation tomorrow.   The patient does not have a current PCP and does need an [facility] hospital follow-up appointment after discharge.  The patient does not have transportation limitations that hinder transportation to clinic appointments.  .Services Needed at time of discharge: Y = Yes, Blank = No PT:   OT:   RN:   Equipment:   Other:     LOS: 3 days   Blain Pais, MD 01/22/2014, 2:48 PM

## 2014-01-22 NOTE — Progress Notes (Signed)
Subjective: [name] was seen in HD today. He reports that he had felt nauseous and had a bout of vomiting this morning. He continues to experience pain in his L eye but says that the Dilaudid is treating the pain better (received four doses yesterday) than the Vicodin. He still see double vision from his L eye if he closes his right eye.     Objective: Vital signs in last 24 hours: Filed Vitals:   01/22/14 1105 01/22/14 1135 01/22/14 1150 01/22/14 1242  BP: 150/83 142/80 141/84 159/72  Pulse: 72 72 72 74  Temp:   98.1 F (36.7 C) 98 F (36.7 C)  TempSrc:   Oral Oral  Resp:   18 18  Height:      Weight:   57.3 kg (126 lb 5.2 oz)   SpO2:    98%   Weight change: -1.043 kg (-2 lb 4.8 oz)  Intake/Output Summary (Last 24 hours) at 01/22/14 1514 Last data filed at 01/22/14 1322  Gross per 24 hour  Intake    410 ml  Output   2300 ml  Net  -1890 ml   General: resting in bed in NAD  HEENT: less periorbital swelling on L compared to R from initial presentation; mild ptosis of [name]; L sided monocular diplopia, EOM in L eye continue to be limited and L abduction may be more grossly intact compared to yesterday. L and R eye pupils continue to be nonreactive to light and dilated likely from dilating agent from optho's exam on 10/3 and Cardiac: RRR, no rubs, murmurs or gallops  Pulm: clear to auscultation bilaterally, moving normal volumes of air  Abd: soft, nontender, nondistended, BS present  Ext: warm and well perfused, no pedal edema.  Neuro: alert and oriented X3, responding appropriately, following commands.    Lab Results: Results for [name], [name] (MRN [id number]) as of 01/22/2014 14:55  Ref. Range 01/22/2014 05:00  Sodium Latest Range: 136-145 mEq/L 131 (L)  Potassium Latest Range: 3.5-5.1 mEq/L 4.1  Chloride Latest Range: 96-112 mEq/L 89 (L)  CO2 Latest Range: 19-32 mEq/L 25  BUN Latest Range: 7.0-26.0 mg/dL 44 (H)  Creatinine Latest Range: 0.50-1.35 mg/dL 10.29 (H)    Calcium Latest Range: 8.4-10.5 mg/dL 8.4  GFR calc non Af Amer Latest Range: >90 mL/min 5 (L)  GFR calc Af Amer Latest Range: >90 mL/min 6 (L)  Glucose Latest Range: 70-99 mg/dL 93  Anion gap Latest Range: 5-15  17 (H)  Phosphorus Latest Range: 2.3-4.6 mg/dL 6.5 (H)  Albumin Latest Range: 3.5-5.2 g/dL 3.4 (L)  WBC Latest Range: 4.0-10.5 K/uL 3.9 (L)  RBC Latest Range: 4.22-5.81 MIL/uL 4.37  Hemoglobin Latest Range: 13.0-17.0 g/dL 12.6 (L)  HCT Latest Range: 39.0-52.0 % 37.9 (L)  MCV Latest Range: 78.0-100.0 fL 86.7  MCH Latest Range: 26.0-34.0 pg 28.8  MCHC Latest Range: 30.0-36.0 g/dL 33.2  RDW Latest Range: 11.5-15.5 % 16.1 (H)  Platelets Latest Range: 150-400 K/uL 204   Micro Results: Recent Results (from the past 240 hour(s))  CULTURE, BLOOD (ROUTINE X 2)     Status: None   Collection Time    01/19/14  7:35 PM      Result Value Ref Range Status   Specimen Description BLOOD RIGHT ARM   Final   Special Requests BOTTLES DRAWN AEROBIC ONLY 10 CC   Final   Culture  Setup Time     Final   Value: 01/20/2014 00:48     Performed at [facility]  Culture     Final   Value:        BLOOD CULTURE RECEIVED NO GROWTH TO DATE CULTURE WILL BE HELD FOR 5 DAYS BEFORE ISSUING A FINAL NEGATIVE REPORT     Performed at [facility]   Report Status PENDING   Incomplete  CULTURE, BLOOD (ROUTINE X 2)     Status: None   Collection Time    01/19/14  7:37 PM      Result Value Ref Range Status   Specimen Description BLOOD RIGHT ARM   Final   Special Requests BOTTLES DRAWN AEROBIC AND ANAEROBIC 10 CC   Final   Culture  Setup Time     Final   Value: 01/20/2014 00:48     Performed at [facility]   Culture     Final   Value:        BLOOD CULTURE RECEIVED NO GROWTH TO DATE CULTURE WILL BE HELD FOR 5 DAYS BEFORE ISSUING A FINAL NEGATIVE REPORT     Performed at [facility]   Report Status PENDING   Incomplete   Studies/Results: [name] Wo Contrast  01/21/2014    CLINICAL DATA:  Diabetic with LEFT proptosis.  EXAM: MRA HEAD WITHOUT CONTRAST  TECHNIQUE: Angiographic images of the Circle of Willis were obtained using MRA technique without intravenous contrast.  COMPARISON:  CT head 01/19/2014. CT maxillofacial 01/20/2014. [name] of the head and orbits reported separately.  FINDINGS: The LEFT internal carotid artery is widely patent.  The RIGHT internal carotid artery demonstrates long segment 50% stenosis in its inferior cavernous segment. There is a similar 50% stenosis in the cavernous/supraclinoid junction. Both ICA termini are widely patent.  Basilar artery widely patent with vertebrals both contributing, RIGHT dominant. No proximal stenosis of the RIGHT or LEFT middle cerebral artery. No MCA branch occlusion.  Both posterior cerebral arteries widely patent in their proximal segments. Some mild irregularity in the LEFT P3 PCA segment distal to the ambient cistern.  Moderate irregularity of the RIGHT A1 ACA. The LEFT A1 ACA is robust and supplies both anterior cerebrals as an azygos A2.  IMPRESSION: Non stenotic atheromatous change of the skull base and cavernous RIGHT internal carotid artery, likely manifestation of diabetic type atherosclerotic disease. Similar moderate irregularity intracranially affecting the proximal RIGHT anterior cerebral.  No flow reducing lesion of the carotid or basilar arteries is identified.   Electronically Signed   By: Rolla Flatten M.D.   On: 01/21/2014 15:41   [name] Brain Wo Contrast  01/21/2014   CLINICAL DATA:  Left eye pain and blurred vision for 3 days. Left eye drainage. Left-sided frontal headache. Diabetes and dialysis.  EXAM: MRI HEAD AND ORBITS WITHOUT CONTRAST  TECHNIQUE: Multiplanar, multiecho pulse sequences of the brain and surrounding structures were obtained without intravenous contrast. Multiplanar, multiecho pulse sequences of the orbits and surrounding structures were obtained including fat saturation techniques, without  intravenous contrast administration.  : COMPARISON:  CT head 01/19/2014  FINDINGS: MRI HEAD FINDINGS  Mild atrophy. Moderate chronic ischemic changes throughout the cerebral white matter. 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 Extensive chronic ischemia in the pons. Cerebellum intact.  Negative for acute infarct.  Chronic hemorrhage in the right frontal lobe along the surface of the brain possibly due to prior subarachnoid hemorrhage or trauma. No mass or edema identified.  MRI ORBITS FINDINGS  Mild left-sided proptosis. The globe is normal in shape and size and is symmetric. Extraocular muscles  are normal. No edema in the orbital fat.  The optic nerve sheath is mildly dilated on the left. Question papilledema. Optic nerve is normal in signal bilaterally. Lacrimal gland is normal. No mass is present within the orbit. No edema in the eyelid to suggest orbital cellulitis. Negative for abscess. Paranasal sinuses reveal minimal mucosal edema in the base of left maxillary sinus.  IMPRESSION: Atrophy and moderately severe chronic microvascular ischemia. No acute infarct.  Chronic hemorrhage right frontal lobe, question prior trauma  Mild left-sided proptosis without mass lesion. Mildly distended optic nerve sheath on the left, question papilledema. No underlying mass edema or abscess identified   Electronically Signed   By: Franchot Gallo M.D.   On: 01/21/2014 15:51   [name] Cm  01/21/2014   CLINICAL DATA:  Left eye pain and blurred vision for 3 days. Left eye drainage. Left-sided frontal headache. Diabetes and dialysis.  EXAM: MRI HEAD AND ORBITS WITHOUT CONTRAST  TECHNIQUE: Multiplanar, multiecho pulse sequences of the brain and surrounding structures were obtained without intravenous contrast. Multiplanar, multiecho pulse sequences of the orbits and surrounding structures were obtained including fat saturation techniques, without intravenous contrast administration.  : COMPARISON:  CT  head 01/19/2014  FINDINGS: MRI HEAD FINDINGS  Mild atrophy. Moderate chronic ischemic changes throughout the cerebral white matter. 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 Extensive chronic ischemia in the pons. Cerebellum intact.  Negative for acute infarct.  Chronic hemorrhage in the right frontal lobe along the surface of the brain possibly due to prior subarachnoid hemorrhage or trauma. No mass or edema identified.  MRI ORBITS FINDINGS  Mild left-sided proptosis. The globe is normal in shape and size and is symmetric. Extraocular muscles are normal. No edema in the orbital fat.  The optic nerve sheath is mildly dilated on the left. Question papilledema. Optic nerve is normal in signal bilaterally. Lacrimal gland is normal. No mass is present within the orbit. No edema in the eyelid to suggest orbital cellulitis. Negative for abscess. Paranasal sinuses reveal minimal mucosal edema in the base of left maxillary sinus.  IMPRESSION: Atrophy and moderately severe chronic microvascular ischemia. No acute infarct.  Chronic hemorrhage right frontal lobe, question prior trauma  Mild left-sided proptosis without mass lesion. Mildly distended optic nerve sheath on the left, question papilledema. No underlying mass edema or abscess identified   Electronically Signed   By: Franchot Gallo M.D.   On: 01/21/2014 15:51   Medications:  Scheduled Meds: . calcium acetate  1,334 mg Oral TID WC  . doxercalciferol  2 mcg Intravenous Q T,Th,Sa-HD  . [START ON 01/24/2014] ferric gluconate (FERRLECIT/NULECIT) IV  62.5 mg Intravenous Q Thu-HD  . heparin  5,000 Units Subcutaneous 3 times per day  . hydrALAZINE  100 mg Oral BID  . levETIRAcetam  1,000 mg Oral QHS  . methylPREDNISolone (SOLU-MEDROL) injection  60 mg Intravenous Daily  . multivitamin  1 tablet Oral QHS  . pantoprazole  40 mg Oral Daily  . sodium chloride  3 mL Intravenous Q12H  . sodium chloride  3 mL Intravenous Q12H   Continuous  Infusions:  PRN Meds:.sodium chloride, acetaminophen, acetaminophen, calcium acetate, diphenhydrAMINE, HYDROcodone-acetaminophen, HYDROmorphone (DILAUDID) injection, promethazine, sodium chloride  Assessment/Plan: Principal Problem:   Optic neuritis, left Active Problems:   Hypertension   Diabetes mellitus without complication   ESRD on hemodialysis   Seizure disorder   Normocytic anemia   Cocaine abuse   Tobacco abuse  Optic perineuritis, left: Based on  MRI/MRA of head and orbit on 10/05, no evidence of cavernous sinus thrombosis was  seen and mild distention of L optic nerve sheath was noted. According to Dr. Posey Pronto, the significant inflammation of the perineural sheath of the L eye is likely consistent with optic perineuritis, a rare variant of orbital pseudotumor.  - continue to appreciate ophthalmology recommendations  - start 60 mg IV methylprednisolone daily 10/6 as per ophthalmology recs - discontinue vancomycin and ceftriaxone given orbital cellulitis is less likely at this point  - continue Norco/Vicodin 5-325 mg q4h PRN pain  - continue 0.5 Dilaudid IV q4h PRN pain   New t-wave inversions inferior-lateral leads: New compared to prior EKGs. These changes persist on AM EKG today. Troponins have been negative and he denies chest pain making ACS less likely. This may be related to LVH. [name] in NSR with no alarms on telemetry, however tele shows ST depression in lead II. Dr. Redmond Pulling spoke with on-call Cardiology who reviewed the patient's EKG and ECHO with me and agrees that these changes are most likely due to LVH, especially given ECHO findings of LVH, HTN hx and the patient's lack of symptoms.  - discontinued cardiac monitoring 10/6  Hypertension: stable --continue home hydralazine   Diabetes Mellitus: Hgb A1c 5.0 July 2014. Serum BG 165. [name] not currently on treatment. Stable.  - may need to start SSI given initiation of steroids  - continue to monitor CBGs daily   ESRD on  hemodialysis, TThS schedule: received HD today, 10/6 - HD per nephrology   Cocaine abuse: patient has a long history of cocaine abuse with recent use being about three days ago.  - counseled on cessation   Tobacco abuse:  - counseled on cessation   Seizure disorder:  - continue home Keppra   VTE ppx: heparin   Diet: renal diet with 1200 mL fluid restriction   This is a Careers information officer Note.  The care of the patient was discussed with Dr. Megan Salon and Dr. Hayes Ludwig  and the assessment and plan formulated with their assistance.  Please see their attached note for official documentation of the daily encounter.   LOS: 3 days   Josiah Lobo, Med Student 01/22/2014, 3:14 PM  I have seen and examined [name] with medical student Josiah Lobo and agree with his assessment and plan. It now appears that [name] problem is optic perineuritis rather than temporal arteritis. I appreciate Dr. Serita Grit assistance with this rare and difficult problem. I agree with starting IV steroids and stopping the antibiotics.  Michel Bickers, MD [facility] [phone] pager   [phone] cell 01/22/2014, 4:34 PM

## 2014-01-22 NOTE — Progress Notes (Signed)
PT Cancellation Note  Patient Details Name: [name] MRN: [id number] DOB: 1955/07/15   Cancelled Treatment:    Reason Eval/Treat Not Completed: Patient at procedure or test/unavailable  Currently in HD;  Will follow up later today as time allows;  Otherwise, will follow up for PT tomorrow;   Thank you,  Roney Marion, [place] Pager [phone] Office [phone]     [place], [place] 01/22/2014, 11:00 AM

## 2014-01-22 NOTE — Procedures (Signed)
I was present at this dialysis session, have reviewed the session itself and made  appropriate changes  Kelly Splinter MD (pgr) [phone]    ([phone] 01/22/2014, 10:39 AM

## 2014-01-22 NOTE — Progress Notes (Signed)
ANTIBIOTIC CONSULT NOTE - FOLLOW UP  Pharmacy Consult for Vancomycin + Rocephin Indication: L eye/orbital cellulitis  Allergies  Allergen Reactions  . Reglan [Metoclopramide] Other (See Comments)    Tardive dyskinesia in 11/2011 in [place]    Patient Measurements: Height: 5\' 9"  (175.3 cm) Weight: 126 lb 5.2 oz (57.3 kg) IBW/kg (Calculated) : 70.7  Vital Signs: Temp: 98 F (36.7 C) (10/06 1242) Temp Source: Oral (10/06 1242) BP: 159/72 mmHg (10/06 1242) Pulse Rate: 74 (10/06 1242) Intake/Output from previous day: 10/05 0701 - 10/06 0700 In: 290 [P.O.:240; IV Piggyback:50] Out: -  Intake/Output from this shift: Total I/O In: 0  Out: 2300 [Other:2300]  Labs:  Recent Labs  01/20/14 01/20/14 0503 01/22/14 0500  WBC 3.4* 4.5 3.9*  HGB 12.9* 12.4* 12.6*  PLT 218 208 204  CREATININE 5.47* 5.98* 10.29*   Estimated Creatinine Clearance: 6.5 ml/min (by C-G formula based on Cr of 10.29). No results found for this basename: VANCOTROUGH, Corlis Leak, VANCORANDOM, GENTTROUGH, GENTPEAK, GENTRANDOM, TOBRATROUGH, TOBRAPEAK, TOBRARND, AMIKACINPEAK, AMIKACINTROU, AMIKACIN,  in the last 72 hours   Microbiology: Recent Results (from the past 720 hour(s))  CULTURE, BLOOD (ROUTINE X 2)     Status: None   Collection Time    01/19/14  7:35 PM      Result Value Ref Range Status   Specimen Description BLOOD RIGHT ARM   Final   Special Requests BOTTLES DRAWN AEROBIC ONLY 10 CC   Final   Culture  Setup Time     Final   Value: 01/20/2014 00:48     Performed at [facility]   Culture     Final   Value:        BLOOD CULTURE RECEIVED NO GROWTH TO DATE CULTURE WILL BE HELD FOR 5 DAYS BEFORE ISSUING A FINAL NEGATIVE REPORT     Performed at [facility]   Report Status PENDING   Incomplete  CULTURE, BLOOD (ROUTINE X 2)     Status: None   Collection Time    01/19/14  7:37 PM      Result Value Ref Range Status   Specimen Description BLOOD RIGHT ARM   Final   Special Requests  BOTTLES DRAWN AEROBIC AND ANAEROBIC 10 CC   Final   Culture  Setup Time     Final   Value: 01/20/2014 00:48     Performed at [facility]   Culture     Final   Value:        BLOOD CULTURE RECEIVED NO GROWTH TO DATE CULTURE WILL BE HELD FOR 5 DAYS BEFORE ISSUING A FINAL NEGATIVE REPORT     Performed at [facility]   Report Status PENDING   Incomplete    Anti-infectives   Start     Dose/Rate Route Frequency Ordered Stop   01/22/14 1200  vancomycin (VANCOCIN) 500 mg in sodium chloride 0.9 % 100 mL IVPB     500 mg 100 mL/hr over 60 Minutes Intravenous Every T-Th-Sa (Hemodialysis) 01/19/14 1519     01/19/14 1530  vancomycin (VANCOCIN) 1,250 mg in sodium chloride 0.9 % 250 mL IVPB     1,250 mg 166.7 mL/hr over 90 Minutes Intravenous  Once 01/19/14 1519 01/19/14 1833   01/19/14 1515  cefTRIAXone (ROCEPHIN) 2 g in dextrose 5 % 50 mL IVPB     2 g 100 mL/hr over 30 Minutes Intravenous Every 24 hours 01/19/14 1513        Assessment: 66 YOM who continues on  Rocephin + Vancomycin for L eye/orbital cellulitis. MRI on 10/5 did not show any underlying abscess - per opththalmology more indicative of optic perineuritis. Afebrile, WBC 3.9, ESRD-TTS. The patient has received appropriate MD after HD sessions - will continue current dosing for now.  Goal of Therapy:  Pre-HD Vancomycin level of 15-25 mcg/ml  Plan:  1. Continue Vancomycin 500 mg/HD-TTS 2. Reduce Ceftriaxone to 1g IV every 24 hours 3. Will continue to follow HD schedule/duration, culture results, LOT, and antibiotic de-escalation plans   Alycia Rossetti, PharmD, BCPS Clinical Pharmacist Pager: [phone] 01/22/2014 1:40 PM

## 2014-01-22 NOTE — Progress Notes (Signed)
Physical Therapy Treatment Patient Details Name: [name] MRN: [id number] DOB: 10-21-1955 Today's Date: 01/22/2014    History of Present Illness History of Present Illness: [name] is a 58 year old African American male with a history of hypertension, ESRD (TTS dialysis), and prior GI bleeds who presents with L eye pain and associated discharge and swelling as well as a L-sided headache.    PT Comments    Ambulated with RW today; much more steady with bil UE support on RW, however pt indicated he would rather not use RW (and he will likely decline it if a RW is ordered); Will plan to work with single point and RW next session to help discern best assistive device and hopefully garner pt's approval of an acceptable device   Follow Up Recommendations  Home health PT;Outpatient PT Pt may not be home bound -- in that case, he would benefit from Outpatient PT for gait and balance dysfunction (not sure that he has transportation)     Equipment Recommendations  Rolling walker with 5" wheels    Recommendations for Other Services OT consult     Precautions / Restrictions Precautions Precautions: Fall Precaution Comments: Pt reports he has been unsteady on his feet for a while    Mobility  Bed Mobility Overal bed mobility: Modified Independent                Transfers Overall transfer level: Needs assistance Equipment used: Rolling walker (2 wheeled) Transfers: Sit to/from Stand Sit to Stand: Min guard         General transfer comment: Unsteady at initial stand; RW helped  Ambulation/Gait Ambulation/Gait assistance: Supervision Ambulation Distance (Feet): 80 Feet Assistive device: Rolling walker (2 wheeled) (occasional use of hallway rail) Gait Pattern/deviations: Step-through pattern;Decreased stride length     General Gait Details: Much more steady with RW than without; cues for posture and rW proximity   Stairs            Wheelchair Mobility     Modified Rankin (Stroke Patients Only)       Balance             Standing balance-Leahy Scale: Fair                      Cognition Arousal/Alertness: Awake/alert Behavior During Therapy: WFL for tasks assessed/performed Overall Cognitive Status: Within Functional Limits for tasks assessed                      Exercises      General Comments        Pertinent Vitals/Pain Pain Assessment: 0-10 Pain Score: 7  Pain Location: L eye Pain Descriptors / Indicators: Aching Pain Intervention(s): Other (comment) (pt aware that his pain meds are due at around 5pm)    Home Living                      Prior Function            PT Goals (current goals can now be found in the care plan section) Acute Rehab PT Goals Patient Stated Goal: wants less pain PT Goal Formulation: With patient Time For Goal Achievement: 02/03/14 Potential to Achieve Goals: Good Progress towards PT goals: Progressing toward goals    Frequency  Min 3X/week    PT Plan Current plan remains appropriate    Co-evaluation             End  of Session Equipment Utilized During Treatment: Gait belt Activity Tolerance: Patient tolerated treatment well Patient left: in bed;with call bell/phone within reach;with bed alarm set     Time: 6144-3154 PT Time Calculation (min): 13 min  Charges:  $Gait Training: 8-22 mins                    G Codes:      [name] 01/22/2014, 4:28 PM  [name], [place] Pager [phone] Office [phone]

## 2014-01-23 DIAGNOSIS — H5712 Ocular pain, left eye: Secondary | ICD-10-CM

## 2014-01-23 LAB — GLUCOSE, CAPILLARY: GLUCOSE-CAPILLARY: 108 mg/dL — AB (ref 70–99)

## 2014-01-23 LAB — MPO/PR-3 (ANCA) ANTIBODIES: Serine Protease 3: <1

## 2014-01-23 LAB — ANA: Anti Nuclear Antibody(ANA): NEGATIVE

## 2014-01-23 LAB — ANGIOTENSIN CONVERTING ENZYME: Angiotensin-Converting Enzyme: 31 U/L (ref 8–52)

## 2014-01-23 MED ORDER — PENTAFLUOROPROP-TETRAFLUOROETH EX AERO: 1.0000 "application " | INHALATION_SPRAY | CUTANEOUS | Status: DC | PRN

## 2014-01-23 MED ORDER — SODIUM CHLORIDE 0.9 % IV SOLN: 100.0000 mL | INTRAVENOUS | Status: DC | PRN

## 2014-01-23 MED ORDER — ALTEPLASE 2 MG IJ SOLR
2.0000 mg | Freq: Once | INTRAMUSCULAR | Status: AC | PRN
  Filled 2014-01-23: qty 2

## 2014-01-23 MED ORDER — LIDOCAINE-PRILOCAINE 2.5-2.5 % EX CREA: 1.0000 "application " | TOPICAL_CREAM | CUTANEOUS | Status: DC | PRN

## 2014-01-23 MED ORDER — LIDOCAINE HCL (PF) 1 % IJ SOLN: 5.0000 mL | INTRAMUSCULAR | Status: DC | PRN

## 2014-01-23 MED ORDER — NEPRO/CARBSTEADY PO LIQD: 237.0000 mL | ORAL | Status: DC | PRN

## 2014-01-23 MED ORDER — HEPARIN SODIUM (PORCINE) 1000 UNIT/ML DIALYSIS
1000.0000 [IU] | INTRAMUSCULAR | Status: DC | PRN
  Filled 2014-01-23: qty 1

## 2014-01-23 NOTE — Progress Notes (Signed)
  I have seen and examined the patient, and reviewed the daily progress note by Josiah Lobo, MS4 and discussed the care of the patient with them. Please see my progress note from 01/23/2014 for further details regarding assessment and plan.    Signed:  Francesca Oman, DO 01/23/2014, 11:32 AM

## 2014-01-23 NOTE — Evaluation (Signed)
Occupational Therapy Evaluation Patient Details Name: [name] MRN: [id number] DOB: 11/30/1955 Today's Date: 01/23/2014    History of Present Illness History of Present Illness: [name] is a 58 year old African American male with a history of hypertension, ESRD (TTS dialysis), and prior GI bleeds who presents with L eye pain and associated discharge and swelling as well as a L-sided headache.   Clinical Impression   Pt was functioning independently prior to admission although he reports he was unsteady on his feet.  He presents today with dizziness with sitting and standing/impaired balance and pain, ptosis and diplopia in the L eye interfering with ability to perform ADL and ADL transfers.  Will follow acutely.    Follow Up Recommendations  Home health OT    Equipment Recommendations       Recommendations for Other Services       Precautions / Restrictions Precautions Precautions: Fall Precaution Comments: reports increased dizziness upon sitting, RN aware and has orders for orthostatics Restrictions Weight Bearing Restrictions: No      Mobility Bed Mobility Overal bed mobility: Modified Independent                Transfers Overall transfer level: Needs assistance Equipment used: Rolling walker (2 wheeled) Transfers: Sit to/from Stand Sit to Stand: Min assist         General transfer comment: Unsteady at initial stand; RW helped    Balance Overall balance assessment: Needs assistance Sitting-balance support: Feet supported Sitting balance-Leahy Scale: Fair Sitting balance - Comments: supervision due to dizziness   Standing balance support: No upper extremity supported;During functional activity Standing balance-Leahy Scale: Poor                              ADL Overall ADL's : Needs assistance/impaired Eating/Feeding: Modified independent;Sitting   Grooming: Wash/dry hands;Min guard;Standing   Upper Body Bathing: Supervision/  safety;Sitting   Lower Body Bathing: Sit to/from stand;Minimal assistance   Upper Body Dressing : Supervision/safety;Sitting   Lower Body Dressing: Minimal assistance;Sit to/from stand   Toilet Transfer: Minimal assistance;Ambulation;RW;Regular Toilet   Toileting- Clothing Manipulation and Hygiene: Minimal assistance;Sit to/from stand       Functional mobility during ADLs: Minimal assistance;Rolling walker General ADL Comments: Pt with impaired depth perception interfering with reach on food tray and toward ADL items on sink.  Pt c/o dizziness, agreeable to use of walker, but needing min assist to gain balance.     Vision                     Perception     Praxis      Pertinent Vitals/Pain Pain Assessment: 0-10 Pain Score: 4  Pain Location: l eye Pain Descriptors / Indicators: Sore Pain Intervention(s): Repositioned;Monitored during session     Hand Dominance Right   Extremity/Trunk Assessment Upper Extremity Assessment Upper Extremity Assessment: Overall WFL for tasks assessed   Lower Extremity Assessment Lower Extremity Assessment: Defer to PT evaluation   Cervical / Trunk Assessment Cervical / Trunk Assessment: Normal   Communication Communication Communication: No difficulties   Cognition Arousal/Alertness: Awake/alert Behavior During Therapy: WFL for tasks assessed/performed Overall Cognitive Status: Within Functional Limits for tasks assessed                     General Comments       Exercises       Shoulder Instructions  Home Living Family/patient expects to be discharged to:: Private residence Living Arrangements: Non-relatives/Friends Available Help at Discharge: Friend(s);Available PRN/intermittently Type of Home: House Home Access: Stairs to enter [employer] of Steps: 6 Entrance Stairs-Rails: Right Home Layout: One level     Bathroom Shower/Tub: Teacher, early years/pre: Standard     Home  Equipment: None   Additional Comments: pt does not want a RW      Prior Functioning/Environment Level of Independence: Independent        Comments: though pt reports he has been unsteady    OT Diagnosis: Generalized weakness;Disturbance of vision;Acute pain   OT Problem List: Decreased activity tolerance;Impaired balance (sitting and/or standing);Impaired vision/perception;Decreased coordination;Decreased knowledge of use of DME or AE;Pain   OT Treatment/Interventions: Self-care/ADL training;DME and/or AE instruction;Therapeutic activities;Visual/perceptual remediation/compensation;Patient/family education;Balance training    OT Goals(Current goals can be found in the care plan section) Acute Rehab OT Goals Patient Stated Goal: regain eyesight OT Goal Formulation: With patient Time For Goal Achievement: 02/06/14 Potential to Achieve Goals: Good ADL Goals Pt Will Perform Grooming: with modified independence;standing Pt Will Perform Upper Body Bathing: with modified independence;sitting Pt Will Perform Lower Body Bathing: with modified independence;sit to/from stand Pt Will Perform Upper Body Dressing: with modified independence;sitting Pt Will Perform Lower Body Dressing: with modified independence;sit to/from stand Pt Will Transfer to Toilet: with modified independence;ambulating;regular height toilet Pt Will Perform Toileting - Clothing Manipulation and hygiene: with modified independence;sit to/from stand Pt Will Perform Tub/Shower Transfer: Tub transfer;with modified independence;ambulating Additional ADL Goal #1: Monitor ptosis and vision in L eye and provide adaptations as appropriate.  OT Frequency: Min 2X/week   Barriers to D/C:            Co-evaluation              End of Session Equipment Utilized During Treatment: Rolling walker;Gait belt Nurse Communication: Other (comment) (aware of dizziness, doing orthostatics)  Activity Tolerance: Patient tolerated  treatment well Patient left: in bed;with call bell/phone within reach;with bed alarm set   Time: 0930-0950 OT Time Calculation (min): 20 min Charges:  OT General Charges $OT Visit: 1 Procedure OT Evaluation $Initial OT Evaluation Tier I: 1 Procedure OT Treatments $Self Care/Home Management : 8-22 mins G-Codes:    Malka So 01/23/2014, 10:04 AM [phone]

## 2014-01-23 NOTE — Consult Note (Signed)
[name]                                                                               01/23/2014                                               Pediatric Ophthalmology Consultation                                         HPI Update: [name] is much more interactive and feeling better tonight. He reports vast improvement in his pain; still with ptosis OS and diplopia. New complaints systemically are dizziness with exertion. "I walked in here on my own, now I get dizzy just trying to walk to the door."  Pertinent Medical History:   Active Ambulatory Problems    Diagnosis Date Noted  . Hypertension   . Diabetes mellitus without complication   . ESRD on hemodialysis   . Seizure disorder   . Normocytic anemia 03/02/2012  . Cocaine abuse 03/02/2012  . Tobacco abuse 03/02/2012  . GI bleed 05/04/2012  . Malnutrition of moderate degree 01/03/2013  . Acute blood loss anemia 08/21/2013   Resolved Ambulatory Problems    Diagnosis Date Noted  . Hyperkalemia 03/02/2012  . Hyperphosphatemia 03/02/2012  . Hypocalcemia 03/02/2012  . Volume overload 03/02/2012  . Hemorrhage of rectum and anus 05/05/2012  . End stage renal disease 05/26/2012  . Anemia 11/07/2012  . Hypokalemia 11/07/2012  . Heme + stool 11/08/2012  . Microcytic anemia 08/21/2013   Past Medical History  Diagnosis Date  . Anemia, chronic disease   . CHF (congestive heart failure)   . Hepatitis C antibody test positive   . Hepatitis B core antibody positive   . Positive QuantiFERON-TB Gold test   . Helicobacter pylori gastritis   . Polyp of colon, adenomatous   . Shortness of breath   . Hematochezia   . Head injury, closed, with concussion   . History of blood transfusion   . Type II diabetes mellitus   . Arthritis   . Headache(784.0)   . Hypercalcemia   . Hyperpotassemia   . Seizures      Visual Fields: FTC OU      Pupils:  Equal, very sluggish at 4.37mm OU, no APD  Near acuity:   Cc +2.50    OD   CSM  J1       cc +2.50    OS   CSM  J1  TA:        Normal to palpation OU      Dilation:  Not dilated     External:   OD:  Normal, no ptosis appreciated   OS:  Near complete ptosis; no tenderness to palpation; only slightly tender to retropulsion, during which no resistance was appreciated  Anterior segment exam:  By penlight   Conjunctiva:  OD:  Quiet     OS:  Quiet    Cornea:  OD: Clear, no fluorescein stain      OS: Clear, no fluorescein stain     Anterior Chamber:   OD:  Deep/quiet     OS:  Deep/quiet    Iris:    OD:  Normal      OS:  Normal     Lens:    OD:  2+NS   OS:  2+NS  Optic disc:  OD:  Flat, sharp, healthy     OS:  Flat, sharp, healthy  Central retina--examined with indirect ophthalmoscope and 2.2 lens:  OD:  Macula normal; vessel tortuous; media clear     OS:  Macula normal; vessel tortuous; media clear     Impression:   58yo male with complex medical history and severe eye pain that has evolved into a pupil-sparing CNIII palsy. 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 No masses, hemorrhage or infectious causes appreciated on imaging or lab work thus far.  Recommendations/Plan: 1. Continue high-dose steroids. I've specifically instructed [name] to call for [place] immediately if there are new symptoms, new pain or any other concerns. 2. Will continue to follow with team and appreciate the excellent medical management they are providing.  I've discussed these findings with the resident. Please contact our office with any questions or concerns at [phone].   Paco Cislo

## 2014-01-23 NOTE — Progress Notes (Addendum)
Subjective: [name] was seen and examined this morning.  His eye pain has improved somewhat.  He still notes diplopia.    Objective: Vital signs in last 24 hours: Filed Vitals:   01/22/14 1900 01/22/14 2026 01/23/14 0438 01/23/14 0933  BP:  170/87 149/82 139/70  Pulse:  81 74 73  Temp:  98.7 F (37.1 C) 98.6 F (37 C) 98.2 F (36.8 C)  TempSrc:    Oral  Resp:  16 17 18   Height:      Weight: 56.7 kg (125 lb)     SpO2:  99% 98% 97%   Weight change: -1.214 kg (-2 lb 10.8 oz)  Intake/Output Summary (Last 24 hours) at 01/23/14 1112 Last data filed at 01/23/14 0900  Gross per 24 hour  Intake   1080 ml  Output   2300 ml  Net  -1220 ml   Vitals reviewed. General: resting in bed, in NAD HEENT: Pupils are 4-18mm bilateral and NR, limited left eye adduction, left eye appears down and out, swelling is improving Cardiac: RRR, no rubs, murmurs or gallops Pulm: clear to auscultation bilaterally, no wheezes, rales, or rhonchi Abd: soft, nontender, nondistended, BS present Ext: warm and well perfused, no pedal edema Neuro: alert and oriented X3, follows commands appropriately  Lab Results:  CBG:  Recent Labs Lab 01/20/14 0819 01/21/14 0752 01/23/14 0745  GLUCAP 101* 124* 108*    Medications: I have reviewed the patient's current medications. Scheduled Meds: . calcium acetate  1,334 mg Oral TID WC  . doxercalciferol  2 mcg Intravenous Q T,Th,Sa-HD  . [START ON 01/24/2014] ferric gluconate (FERRLECIT/NULECIT) IV  62.5 mg Intravenous Q Thu-HD  . heparin  5,000 Units Subcutaneous 3 times per day  . hydrALAZINE  100 mg Oral BID  . levETIRAcetam  1,000 mg Oral QHS  . methylPREDNISolone (SOLU-MEDROL) injection  60 mg Intravenous Daily  . multivitamin  1 tablet Oral QHS  . pantoprazole  40 mg Oral Daily  . sodium chloride  3 mL Intravenous Q12H  . sodium chloride  3 mL Intravenous Q12H   Continuous Infusions:  PRN Meds:.sodium chloride, sodium chloride, sodium chloride,  acetaminophen, acetaminophen, alteplase, calcium acetate, diphenhydrAMINE, feeding supplement (NEPRO CARB STEADY), heparin, HYDROcodone-acetaminophen, HYDROmorphone (DILAUDID) injection, lidocaine (PF), lidocaine-prilocaine, pentafluoroprop-tetrafluoroeth, promethazine, sodium chloride  Assessment/Plan: 58 year old man with PMH of DM2, ESRD on HD, HTN, cocaine abuse, seizure disorder, presenting with painful ophthalmoplegia of the left eye.  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  This may be idiopathic or related to infection or inflammatory conditions (SLE, Sarcoid, Wegener's, GCA).  ANA, ACE, RPR results are within normal limits.  ANCA pending.  His pain is improving with steroid treatment however he still has limited EOM on exam.  His temporal are is no longer tender.  - will continue high steroid tx with solumedrol IV 60mg , will need PO steroid with slow taper over 30-60 days.  - continue Vicodin PRN and Dilaudid IV PRN for pain - appreciate ophthalmology recommendations  ESRD: On HD on T/Th/Sat. - HD per nephrology  - renal function panel in am  HTN: Stable.  He reported dizziness with standing and walking around.  This may be HD related.   - check orthostatic vitals - continue home hydralazine   DM2: Diet controlled. Fasting CBG 108 this AM.   Seizure disorder: Stable. Continue home [place].   Tobacco use: Counseled on cessation.  Declined nicotine patch.   VTE prophylaxis: Heparin  TID   FEN:  NSL Renal panel in am Renal diet   Dispo: Disposition is deferred at this time, awaiting improvement of current medical problems.  Anticipated discharge in approximately 1-2 day(s).    The patient does not have a current PCP and does need an [facility] hospital follow-up appointment after discharge.  The patient does not have transportation limitations that hinder transportation to clinic appointments.  .Services Needed at time of  discharge: Y = Yes, Blank = No PT:  Y  OT:   RN:   Equipment:  rolling walker with 5 inch wheels  Other:     LOS: 4 days   Francesca Oman, DO 01/23/2014, 11:12 AM  I have seen and examined [name] with Dr. Redmond Pulling and our team. His left periorbital pain has decreased since starting on steroids yesterday but there is no change so far and his diplopia or gaze palsies. We will continue IV methylprednisolone and consider conversion to high-dose oral prednisone soon. He has noted some dizziness when sitting on the side of the bed and standing recently. We will check orthostatic blood pressures and pulse.  Michel Bickers, MD [facility] [phone] pager   [phone] cell 01/23/2014, 2:09 PM

## 2014-01-23 NOTE — Progress Notes (Addendum)
Subjective: [name]. [name] [name] was seen in his room today. He reports less eye pain and says it is more of a "sore" feeling. [name] concerned that he cannot willingly open his [name]. He also mentions that he was not able to walk yesterday when PT saw him and reports feeling dizzy when he wakes up. He continues to have diplopia and says his vision has not worsened.   Objective: Vital signs in last 24 hours: Filed Vitals:   01/22/14 1730 01/22/14 1900 01/22/14 2026 01/23/14 0438  BP: 144/76  170/87 149/82  Pulse: 70  81 74  Temp: 98.2 F (36.8 C)  98.7 F (37.1 C) 98.6 F (37 C)  TempSrc: Oral     Resp: 18  16 17   Height:      Weight:  56.7 kg (125 lb)    SpO2: 98%  99% 98%   Weight change: -1.214 kg (-2 lb 10.8 oz)  Intake/Output Summary (Last 24 hours) at 01/23/14 0804 Last data filed at 01/23/14 0600  Gross per 24 hour  Intake    600 ml  Output   2300 ml  Net  -1700 ml   General: resting in bed in NAD  HEENT: less periorbital swelling on L compared to R from initial presentation; complete ptosis of [name]; binocular diplopia, EOM in L eye continue to be limited and L abduction may be more grossly intact L and R eye pupils continue to be nonreactive to light and dilated likely from dilating agent from optho's exam on 10/3.   Cardiac: RRR, no rubs, murmurs or gallops  Pulm: clear to auscultation bilaterally, moving normal volumes of air  Abd: soft, nontender, nondistended, BS present  Ext: warm and well perfused, no pedal edema.  Neuro: alert and oriented X3, responding appropriately, following commands. Gait not assessed today.    Lab Results: Results for [name] [name], [name] [name] (MRN [id number]) as of 01/23/2014 08:01  Ref. Range 01/22/2014 05:00 01/22/2014 16:31  Sodium Latest Range: 136-145 mEq/L 131 (L)   Potassium Latest Range: 3.5-5.1 mEq/L 4.1   Chloride Latest Range: 96-112 mEq/L 89 (L)   CO2 Latest Range: 19-32 mEq/L 25   BUN Latest Range: 7.0-26.0 mg/dL 44 (H)   Creatinine Latest  Range: 0.50-1.35 mg/dL 10.29 (H)   Calcium Latest Range: 8.4-10.5 mg/dL 8.4   GFR calc non Af Amer Latest Range: >90 mL/min 5 (L)   GFR calc Af Amer Latest Range: >90 mL/min 6 (L)   Glucose Latest Range: 70-99 mg/dL 93   Anion gap Latest Range: 5-15  17 (H)   Phosphorus Latest Range: 2.3-4.6 mg/dL 6.5 (H)   Albumin Latest Range: 3.5-5.2 g/dL 3.4 (L)   WBC Latest Range: 4.0-10.5 K/uL 3.9 (L)   RBC Latest Range: 4.22-5.81 MIL/uL 4.37   Hemoglobin Latest Range: 13.0-17.0 g/dL 12.6 (L)   HCT Latest Range: 39.0-52.0 % 37.9 (L)   MCV Latest Range: 78.0-100.0 fL 86.7   MCH Latest Range: 26.0-34.0 pg 28.8   MCHC Latest Range: 30.0-36.0 g/dL 33.2   RDW Latest Range: 11.5-15.5 % 16.1 (H)   Platelets Latest Range: 150-400 K/uL 204   Angiotensin-Converting Enzyme Latest Range: 8-52 U/L  31    Micro Results: Recent Results (from the past 240 hour(s))  CULTURE, BLOOD (ROUTINE X 2)     Status: None   Collection Time    01/19/14  7:35 PM      Result Value Ref Range Status   Specimen Description BLOOD RIGHT ARM   Final  Special Requests BOTTLES DRAWN AEROBIC ONLY 10 CC   Final   Culture  Setup Time     Final   Value: 01/20/2014 00:48     Performed at [facility]   Culture     Final   Value:        BLOOD CULTURE RECEIVED NO GROWTH TO DATE CULTURE WILL BE HELD FOR 5 DAYS BEFORE ISSUING A FINAL NEGATIVE REPORT     Performed at [facility]   Report Status PENDING   Incomplete  CULTURE, BLOOD (ROUTINE X 2)     Status: None   Collection Time    01/19/14  7:37 PM      Result Value Ref Range Status   Specimen Description BLOOD RIGHT ARM   Final   Special Requests BOTTLES DRAWN AEROBIC AND ANAEROBIC 10 CC   Final   Culture  Setup Time     Final   Value: 01/20/2014 00:48     Performed at [facility]   Culture     Final   Value:        BLOOD CULTURE RECEIVED NO GROWTH TO DATE CULTURE WILL BE HELD FOR 5 DAYS BEFORE ISSUING A FINAL NEGATIVE REPORT     Performed at [facility]   Report Status PENDING   Incomplete   Studies/Results: [name] [name] [name] Contrast  01/21/2014   CLINICAL DATA:  Diabetic with LEFT proptosis.  EXAM: MRA HEAD WITHOUT CONTRAST  TECHNIQUE: Angiographic images of the Circle of Willis were obtained using MRA technique without intravenous contrast.  COMPARISON:  CT head 01/19/2014. CT maxillofacial 01/20/2014. [name] of the head and orbits reported separately.  FINDINGS: The LEFT internal carotid artery is widely patent.  The RIGHT internal carotid artery demonstrates long segment 50% stenosis in its inferior cavernous segment. There is a similar 50% stenosis in the cavernous/supraclinoid junction. Both ICA termini are widely patent.  Basilar artery widely patent with vertebrals both contributing, RIGHT dominant. No proximal stenosis of the RIGHT or LEFT middle cerebral artery. No MCA branch occlusion.  Both posterior cerebral arteries widely patent in their proximal segments. Some mild irregularity in the LEFT P3 PCA segment distal to the ambient cistern.  Moderate irregularity of the RIGHT A1 ACA. The LEFT A1 ACA is robust and supplies both anterior cerebrals as an azygos A2.  IMPRESSION: Non stenotic atheromatous change of the skull base and cavernous RIGHT internal carotid artery, likely manifestation of diabetic type atherosclerotic disease. Similar moderate irregularity intracranially affecting the proximal RIGHT anterior cerebral.  No flow reducing lesion of the carotid or basilar arteries is identified.   Electronically Signed   By: Rolla Flatten M.D.   On: 01/21/2014 15:41   [name] Brain [name] Contrast  01/21/2014   CLINICAL DATA:  Left eye pain and blurred vision for 3 days. Left eye drainage. Left-sided frontal headache. Diabetes and dialysis.  EXAM: MRI HEAD AND ORBITS WITHOUT CONTRAST  TECHNIQUE: Multiplanar, multiecho pulse sequences of the brain and surrounding structures were obtained without intravenous contrast. Multiplanar, multiecho pulse  sequences of the orbits and surrounding structures were obtained including fat saturation techniques, without intravenous contrast administration.  : COMPARISON:  CT head 01/19/2014  FINDINGS: MRI HEAD FINDINGS  Mild atrophy. Moderate chronic ischemic changes throughout the cerebral white matter. 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 Extensive chronic ischemia in the pons. Cerebellum intact.  Negative for acute infarct.  Chronic hemorrhage in the right frontal lobe along the  surface of the brain possibly due to prior subarachnoid hemorrhage or trauma. No mass or edema identified.  MRI ORBITS FINDINGS  Mild left-sided proptosis. The globe is normal in shape and size and is symmetric. Extraocular muscles are normal. No edema in the orbital fat.  The optic nerve sheath is mildly dilated on the left. Question papilledema. Optic nerve is normal in signal bilaterally. Lacrimal gland is normal. No mass is present within the orbit. No edema in the eyelid to suggest orbital cellulitis. Negative for abscess. Paranasal sinuses reveal minimal mucosal edema in the base of left maxillary sinus.  IMPRESSION: Atrophy and moderately severe chronic microvascular ischemia. No acute infarct.  Chronic hemorrhage right frontal lobe, question prior trauma  Mild left-sided proptosis without mass lesion. Mildly distended optic nerve sheath on the left, question papilledema. No underlying mass edema or abscess identified   Electronically Signed   By: Franchot Gallo M.D.   On: 01/21/2014 15:51   [name] Farley Ly Cm  01/21/2014   CLINICAL DATA:  Left eye pain and blurred vision for 3 days. Left eye drainage. Left-sided frontal headache. Diabetes and dialysis.  EXAM: MRI HEAD AND ORBITS WITHOUT CONTRAST  TECHNIQUE: Multiplanar, multiecho pulse sequences of the brain and surrounding structures were obtained without intravenous contrast. Multiplanar, multiecho pulse sequences of the orbits and surrounding structures were  obtained including fat saturation techniques, without intravenous contrast administration.  : COMPARISON:  CT head 01/19/2014  FINDINGS: MRI HEAD FINDINGS  Mild atrophy. Moderate chronic ischemic changes throughout the cerebral white matter. 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 Extensive chronic ischemia in the pons. Cerebellum intact.  Negative for acute infarct.  Chronic hemorrhage in the right frontal lobe along the surface of the brain possibly due to prior subarachnoid hemorrhage or trauma. No mass or edema identified.  MRI ORBITS FINDINGS  Mild left-sided proptosis. The globe is normal in shape and size and is symmetric. Extraocular muscles are normal. No edema in the orbital fat.  The optic nerve sheath is mildly dilated on the left. Question papilledema. Optic nerve is normal in signal bilaterally. Lacrimal gland is normal. No mass is present within the orbit. No edema in the eyelid to suggest orbital cellulitis. Negative for abscess. Paranasal sinuses reveal minimal mucosal edema in the base of left maxillary sinus.  IMPRESSION: Atrophy and moderately severe chronic microvascular ischemia. No acute infarct.  Chronic hemorrhage right frontal lobe, question prior trauma  Mild left-sided proptosis without mass lesion. Mildly distended optic nerve sheath on the left, question papilledema. No underlying mass edema or abscess identified   Electronically Signed   By: Franchot Gallo M.D.   On: 01/21/2014 15:51   Medications:  Scheduled Meds: . calcium acetate  1,334 mg Oral TID WC  . doxercalciferol  2 mcg Intravenous Q T,Th,Sa-HD  . [START ON 01/24/2014] ferric gluconate (FERRLECIT/NULECIT) IV  62.5 mg Intravenous Q Thu-HD  . heparin  5,000 Units Subcutaneous 3 times per day  . hydrALAZINE  100 mg Oral BID  . levETIRAcetam  1,000 mg Oral QHS  . methylPREDNISolone (SOLU-MEDROL) injection  60 mg Intravenous Daily  . multivitamin  1 tablet Oral QHS  . pantoprazole  40 mg Oral Daily    . sodium chloride  3 mL Intravenous Q12H  . sodium chloride  3 mL Intravenous Q12H   Continuous Infusions:  PRN Meds:.sodium chloride, acetaminophen, acetaminophen, calcium acetate, diphenhydrAMINE, HYDROcodone-acetaminophen, HYDROmorphone (DILAUDID) injection, promethazine, sodium chloride  Assessment/Plan: Principal Problem:   Optic neuritis,  left Active Problems:   Hypertension   Diabetes mellitus without complication   ESRD on hemodialysis   Seizure disorder   Normocytic anemia   Cocaine abuse   Tobacco abuse  Optic perineuritis, left: Based on MRI/MRA of head and orbit on 10/05, no evidence of cavernous sinus thrombosis was  seen and mild distention of L optic nerve sheath was noted. According to Dr. Posey Pronto, the significant inflammation of the perineural sheath of the L eye is likely consistent with optic perineuritis, a rare variant of orbital pseudotumor. ACE level is normal, RPR is NR.  - continue to appreciate ophthalmology recommendations  - started 60 mg IV methylprednisolone daily 10/6 as per ophthalmology recs, will follow up today and decide on transitioning to 80 mg PO prednisone  - discontinued vancomycin and ceftriaxone 10/6   - continue Norco/Vicodin 5-325 mg q4h PRN pain  - continue 0.5 Dilaudid IV q4h PRN pain  -[ ]  ANA  -[ ]  ANCA antibodies  New t-wave inversions inferior-lateral leads: New compared to prior EKGs. These changes persist on AM EKG today. Troponins have been negative and he denies chest pain making ACS less likely. This may be related to LVH. [name] in NSR with no alarms on telemetry, however tele shows ST depression in lead II. Dr. Redmond Pulling spoke with on-call Cardiology who reviewed the patient's EKG and ECHO with me and agrees that these changes are most likely due to LVH, especially given ECHO findings of LVH, HTN hx and the patient's lack of symptoms.  - discontinued cardiac monitoring 10/6  Hypertension: stable --continue home hydralazine   --reports dizziness, so check orthostatics   Diabetes Mellitus: Hgb A1c 5.0 July 2014. Serum BG 165. [name] not currently on treatment. Stable.  - may need to start SSI given initiation of steroids  - continue to monitor CBGs daily   ESRD on hemodialysis, TThS schedule: received HD yesterday, 10/6 - HD per nephrology   Cocaine abuse: patient has a long history of cocaine abuse with recent use being about three days ago.  - counseled on cessation   Tobacco abuse:  - counseled on cessation   Seizure disorder:  - continue home Keppra   VTE ppx: heparin   Diet: renal diet with 1200 mL fluid restriction   This is a Careers information officer Note.  The care of the patient was discussed with Dr. Megan Salon and Dr. Redmond Pulling  and the assessment and plan formulated with their assistance.  Please see their attached note for official documentation of the daily encounter.   LOS: 4 days   Josiah Lobo, Med Student 01/23/2014, 8:04 AM

## 2014-01-23 NOTE — Progress Notes (Signed)
Subjective:  Continues to have diplopia, left eye pain and restrictive movement; now dizzy, unstable when standing; no further nausea or vomiting   Objective: Vital signs in last 24 hours: Temp:  [98 F (36.7 C)-98.7 F (37.1 C)] 98.6 F (37 C) (10/07 0438) Pulse Rate:  [70-81] 74 (10/07 0438) Resp:  [16-18] 17 (10/07 0438) BP: (137-170)/(72-87) 149/82 mmHg (10/07 0438) SpO2:  [98 %-99 %] 98 % (10/07 0438) Weight:  [56.7 kg (125 lb)-57.3 kg (126 lb 5.2 oz)] 56.7 kg (125 lb) (10/06 1900) Weight change: -1.214 kg (-2 lb 10.8 oz)  Intake/Output from previous day: 10/06 0701 - 10/07 0700 In: 600 [P.O.:600] Out: 2300  Intake/Output this shift: Total I/O In: 240 [P.O.:240] Out: -   Lab Results:  Recent Labs  01/22/14 0500  WBC 3.9*  HGB 12.6*  HCT 37.9*  PLT 204   BMET:  Recent Labs  01/22/14 0500  NA 131*  K 4.1  CL 89*  CO2 25  GLUCOSE 93  BUN 44*  CREATININE 10.29*  CALCIUM 8.4  ALBUMIN 3.4*   No results found for this basename: PTH,  in the last 72 hours Iron Studies: No results found for this basename: IRON, TIBC, TRANSFERRIN, FERRITIN,  in the last 72 hours  Studies/Results: No results found.  EXAM:  General appearance: Alert, in no apparent distress, L eye tenderness, swelling down, but unable to open   Resp: CTA without rales, rhonchi, or wheezes  Cardio: RRR without murmur or rub  GI: + BS, soft and nontender  Extremities: No edema  Access: AVF @ LUA with + bruit   Dialysis Orders: TTS @ AF  58.5 kg 4 hrs 2K/2.25Ca 400/A1.5 No Heparin AVF @ LUA  Hectorol 2Aranesp 80 mcg 9/29 and Venofer 50 mg on Thurs.  Assessment/Plan: 1. Left eye pain - pain better on Dilaudid, swelling down; MRI 10/6 showed mildly distended optic nerve sheath on L, suggesting optic perineuritis, per Ophthalmology and ID; antibiotics dc'd, IV steroids.  2. ESRD - HD on TTS @ AF, K 4.1. HD tomorrow. 3. HTN/volume - BP 149/82 on Hydralazine 100 mg bid, was also on Irbesartan 150  mg qd as outpatient; wt 56.7 kg s/p net UF 2.3 L yesterday. 4. Anemia - Hgb 12.6, Aranesp on hold, continue weekly Fe.  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  6. Nutrition - Alb 3.4, renal diet, multivitamin.  7. Hx Dm Type 2  8. Hepatitis C  9. Hx seizure disorder - on Keppra.  10. Hx cocaine abuse - admits smoking crack 3 days prior to admission.     LOS: 4 days   [name],Kalee 01/23/2014,8:38 AM  Pt seen, examined and agree w A/P as above.  Kelly Splinter MD pager [phone]    cell [phone] 01/23/2014, 12:38 PM

## 2014-01-23 NOTE — Progress Notes (Signed)
CARE MANAGEMENT NOTE 01/23/2014  Patient:  [name],[name]   Account Number:  [id number]  Date Initiated:  01/23/2014  Documentation initiated by:  Lizabeth Leyden  Subjective/Objective Assessment:   admitted with cellulitis of left eye     Action/Plan:   progression of care and discharge planning   Anticipated DC Date:  01/24/2014   Anticipated DC Plan:  [place]  CM consult      Choice offered to / List presented to:             Status of service:   Medicare Important Message given?  YES (If response is "NO", the following Medicare IM given date fields will be blank) Date Medicare IM given:  01/23/2014 Medicare IM given by:  Lizabeth Leyden Date Additional Medicare IM given:   Additional Medicare IM given by:    Discharge Disposition:    Per UR Regulation:    If discussed at Long Length of Stay Meetings, dates discussed:    Comments:  01/23/2014 [place], [place] [phone] Met with patient regarding medical follow up and home health/DME.  His PCP Dr Mercy Moore, pharmacy [facility] on [facility]. No issues with transportation.  Discussed home health services if MD orders for PT and OT. The patient declined any home health services. Asked about use of RW. He declined RW.

## 2014-01-23 NOTE — Progress Notes (Signed)
  I have seen and examined the patient, and reviewed the daily progress note by Josiah Lobo, MS4 and discussed the care of the patient with them. Please see my progress note from 01/23/2014 for further details regarding assessment and plan.    Signed:  Francesca Oman, DO 01/23/2014, 12:09 PM

## 2014-01-24 DIAGNOSIS — H532 Diplopia: Secondary | ICD-10-CM

## 2014-01-24 LAB — RENAL FUNCTION PANEL
ALBUMIN: 3.2 g/dL — AB (ref 3.5–5.2)
Anion gap: 15 (ref 5–15)
BUN: 38 mg/dL — ABNORMAL HIGH (ref 6–23)
CALCIUM: 8.9 mg/dL (ref 8.4–10.5)
CO2: 25 mEq/L (ref 19–32)
Chloride: 94 mEq/L — ABNORMAL LOW (ref 96–112)
Creatinine, Ser: 8.74 mg/dL — ABNORMAL HIGH (ref 0.50–1.35)
GFR calc non Af Amer: 6 mL/min — ABNORMAL LOW (ref >90)
GFR, EST AFRICAN AMERICAN: 7 mL/min — AB (ref >90)
Glucose, Bld: 111 mg/dL — ABNORMAL HIGH (ref 70–99)
PHOSPHORUS: 4.7 mg/dL — AB (ref 2.3–4.6)
Potassium: 4 mEq/L (ref 3.7–5.3)
SODIUM: 134 meq/L — AB (ref 137–147)

## 2014-01-24 LAB — CBC
HCT: 35.9 % — ABNORMAL LOW (ref 39.0–52.0)
Hemoglobin: 11.7 g/dL — ABNORMAL LOW (ref 13.0–17.0)
MCH: 28.2 pg (ref 26.0–34.0)
MCHC: 32.6 g/dL (ref 30.0–36.0)
MCV: 86.5 fL (ref 78.0–100.0)
PLATELETS: 177 10*3/uL (ref 150–400)
RBC: 4.15 MIL/uL — ABNORMAL LOW (ref 4.22–5.81)
RDW: 15.9 % — AB (ref 11.5–15.5)
WBC: 4.9 10*3/uL (ref 4.0–10.5)

## 2014-01-24 LAB — GLUCOSE, CAPILLARY: Glucose-Capillary: 138 mg/dL — ABNORMAL HIGH (ref 70–99)

## 2014-01-24 MED ORDER — DOXERCALCIFEROL 4 MCG/2ML IV SOLN
INTRAVENOUS | Status: AC
  Administered 2014-01-24: 2 ug via INTRAVENOUS
  Filled 2014-01-24: qty 2

## 2014-01-24 MED ORDER — HYDROMORPHONE HCL 1 MG/ML IJ SOLN
INTRAMUSCULAR | Status: AC
  Administered 2014-01-24: 0.5 mg via INTRAVENOUS
  Filled 2014-01-24: qty 1

## 2014-01-24 NOTE — Progress Notes (Signed)
Subjective: Seen on dialysis, still with left eye pain, better with Dilaudid, swelling is down  Objective: Vital signs in last 24 hours: Temp:  [98 F (36.7 C)-98.9 F (37.2 C)] 98 F (36.7 C) (10/08 0700) Pulse Rate:  [71-80] 71 (10/08 0800) Resp:  [18-22] 18 (10/08 0800) BP: (100-190)/(48-96) 138/92 mmHg (10/08 0800) SpO2:  [93 %-100 %] 99 % (10/08 0700) Weight:  [57.743 kg (127 lb 4.8 oz)-58.8 kg (129 lb 10.1 oz)] 58.8 kg (129 lb 10.1 oz) (10/08 0700) Weight change: 0.443 kg (15.6 oz)  Intake/Output from previous day: 10/07 0701 - 10/08 0700 In: 1060 [P.O.:1060] Out: 0  Intake/Output this shift:   Lab Results:  Recent Labs  01/22/14 0500 01/24/14 0700  WBC 3.9* 4.9  HGB 12.6* 11.7*  HCT 37.9* 35.9*  PLT 204 177   BMET:  Recent Labs  01/22/14 0500  NA 131*  K 4.1  CL 89*  CO2 25  GLUCOSE 93  BUN 44*  CREATININE 10.29*  CALCIUM 8.4  ALBUMIN 3.4*   No results found for this basename: PTH,  in the last 72 hours Iron Studies: No results found for this basename: IRON, TIBC, TRANSFERRIN, FERRITIN,  in the last 72 hours  Studies/Results: No results found.  EXAM:  General appearance: Alert, in no apparent distress, L eye tenderness, swelling down, but unable to open  Resp: CTA without rales, rhonchi, or wheezes  Cardio: RRR without murmur or rub  GI: + BS, soft and nontender  Extremities: No edema  Access: AVF @ LUA with BFR 400   Dialysis Orders: TTS @ AF  58.5 kg 4 hrs 2K/2.25Ca 400/A1.5 No Heparin AVF @ LUA  Hectorol 2Aranesp 80 mcg 9/29 and Venofer 50 mg on Thurs.  Assessment/Plan: 1. Left eye pain - pain better on Dilaudid, swelling down; MRI 10/6 showed mildly distended optic nerve sheath on L, suggesting optic perineuritis, per Ophthalmology and ID; antibiotics dc'd, IV steroids.  2. ESRD - HD on TTS @ AF, K 4.1. HD today.  3. HTN/volume - BP 138/92 on Hydralazine 100 mg bid, was also on Irbesartan 150 mg qd as outpatient; wt 58.8 kg with UF goal 2  L. 4. Anemia - Hgb 11.7, Aranesp on hold, continue weekly Fe.  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  6. Nutrition - Alb 3.4, renal diet, multivitamin.  7. Hx Dm Type 2  8. Hepatitis C  9. Hx seizure disorder - on Keppra.  10. Hx cocaine abuse - admits smoking crack 3 days prior to admission.    LOS: 5 days   [name],[name] 01/24/2014,8:35 AM  Pt seen, examined and agree w A/P as above. Slow improvement from optic perineuritis.  There is a case report of optic perineuritis from cocaine abuse but this was associated with recurrent maxillary sinusitis on the same side (the optic nerve sits atop the maxillary sinus in one area before entering the eye).  He says he usually smokes cocaine and only occasionally does intranasal cocaine.  MRI did not show sinusitis, it did mention minimal mucosal edema at the base of the L max sinus.  HD today.   [name] Splinter MD pager [phone]    cell [phone] 01/24/2014, 10:41 AM

## 2014-01-24 NOTE — Progress Notes (Signed)
  I have seen and examined the patient, and reviewed the daily progress note by Josiah Lobo, MS4 and discussed the care of the patient with them. Please see my progress note from 01/24/2014 for further details regarding assessment and plan.    Signed:  Francesca Oman, DO 01/24/2014, 5:51 PM

## 2014-01-24 NOTE — Progress Notes (Signed)
Subjective: Mr. Jumper was seen and examined this morning.  His left eye pain is a little worse today.   Objective: Vital signs in last 24 hours: Filed Vitals:   01/24/14 1030 01/24/14 1100 01/24/14 1115 01/24/14 1128  BP: 144/74 157/79 151/75 167/75  Pulse: 79 85 87 84  Temp:   98.2 F (36.8 C)   TempSrc:   Oral   Resp:   18   Height:      Weight:    56.5 kg (124 lb 9 oz)  SpO2:       Weight change: 0.443 kg (15.6 oz)  Intake/Output Summary (Last 24 hours) at 01/24/14 1706 Last data filed at 01/24/14 1300  Gross per 24 hour  Intake    700 ml  Output   2000 ml  Net  -1300 ml   Vitals reviewed. General: resting in bed on HD HEENT: Pupils equal bilateral and NR, limited left eye adduction, left eye appears down and out, swelling is improving, exam is similar to yesterday Cardiac: RRR, no rubs, murmurs or gallops Pulm: clear to auscultation bilaterally, no wheezes, rales, or rhonchi Abd: soft, nontender, nondistended, BS present Ext: warm and well perfused, no pedal edema Neuro: alert and oriented X3, follows commands appropriately  Lab Results:  CBG:  Recent Labs Lab 01/20/14 0819 01/21/14 0752 01/23/14 0745 01/24/14 1153  GLUCAP 101* 124* 108* 138*    Medications: I have reviewed the patient's current medications. Scheduled Meds: . calcium acetate  1,334 mg Oral TID WC  . doxercalciferol  2 mcg Intravenous Q T,Th,Sa-HD  . ferric gluconate (FERRLECIT/NULECIT) IV  62.5 mg Intravenous Q Thu-HD  . heparin  5,000 Units Subcutaneous 3 times per day  . hydrALAZINE  100 mg Oral BID  . levETIRAcetam  1,000 mg Oral QHS  . methylPREDNISolone (SOLU-MEDROL) injection  60 mg Intravenous Daily  . multivitamin  1 tablet Oral QHS  . pantoprazole  40 mg Oral Daily  . sodium chloride  3 mL Intravenous Q12H  . sodium chloride  3 mL Intravenous Q12H   Continuous Infusions:  PRN Meds:.sodium chloride, acetaminophen, acetaminophen, calcium acetate, diphenhydrAMINE,  HYDROcodone-acetaminophen, HYDROmorphone (DILAUDID) injection, promethazine, sodium chloride  Assessment/Plan: 58 year old man with PMH of DM2, ESRD on HD, HTN, cocaine abuse, seizure disorder, presenting with painful ophthalmoplegia of the left eye.  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  This may be idiopathic or related to infection or inflammatory conditions (SLE, Sarcoid, Wegener's, GCA).  ANA, ACE, RPR results are within normal limits.  ANCA pending.  His pain is improving with steroid treatment however he still has limited EOM on exam.  His temporal are is no longer tender.  - will continue high steroid tx with solumedrol IV 60mg , will need PO steroid with slow taper over 30-60 days; can likely transition to po tomorrow  - continue Vicodin PRN and Dilaudid IV PRN for pain - appreciate ophthalmology recommendations  ESRD: On HD on T/Th/Sat. - HD per nephrology  - renal function panel in am  HTN: Stable.  He reported dizziness with standing and walking around.  This may be HD related.  He has positive orthostatic vitals. - he is on hydralazine and orthostatic but BPs elevated - consider midodrine  DM2: Diet controlled. Fasting CBG 138 this AM.   Seizure disorder: Stable. Continue home [place].   Tobacco use: Counseled on cessation.  Declined nicotine patch.   VTE prophylaxis: Heparin TID   FEN:  NSL Renal panel in am Renal diet   Dispo: Disposition is deferred at this time, awaiting improvement of current medical problems.  Anticipated discharge in approximately 1-2 day(s).    The patient does not have a current PCP and does need an [facility] hospital follow-up appointment after discharge.  The patient does not have transportation limitations that hinder transportation to clinic appointments.  .Services Needed at time of discharge: Y = Yes, Blank = No PT:  Y  OT:   RN:   Equipment:  rolling walker with 5 inch wheels    Other:     LOS: 5 days   Francesca Oman, DO 01/24/2014, 5:06 PM

## 2014-01-24 NOTE — Progress Notes (Signed)
Patient ID: [name], male   DOB: [birth year], 58 y.o.   MRN: [id number]        Attending progress note    Date of Admission:  01/19/2014     Principal Problem:   Optic neuritis, left Active Problems:   Hypertension   Diabetes mellitus without complication   ESRD on hemodialysis   Seizure disorder   Normocytic anemia   Cocaine abuse   Tobacco abuse   . calcium acetate  1,334 mg Oral TID WC  . doxercalciferol  2 mcg Intravenous Q T,Th,Sa-HD  . ferric gluconate (FERRLECIT/NULECIT) IV  62.5 mg Intravenous Q Thu-HD  . heparin  5,000 Units Subcutaneous 3 times per day  . hydrALAZINE  100 mg Oral BID  . levETIRAcetam  1,000 mg Oral QHS  . methylPREDNISolone (SOLU-MEDROL) injection  60 mg Intravenous Daily  . multivitamin  1 tablet Oral QHS  . pantoprazole  40 mg Oral Daily  . sodium chloride  3 mL Intravenous Q12H  . sodium chloride  3 mL Intravenous Q12H    I have seen and examined [name] today with our team. [name] having more pain around his left eye today. He has not noted any change in his diplopia and still has left gaze palsies. We will continue high-dose steroids for now.  Michel Bickers, MD [facility] [phone] pager   [phone] cell 01/24/2014, 1:18 PM

## 2014-01-24 NOTE — Progress Notes (Signed)
01/24/14  0948  PT CANCELLATION NOTE  Pt. Currently not available as he is in HD.  Will follow up at next available opportunity.    Gerlean Ren PT [facility] [phone]

## 2014-01-24 NOTE — Progress Notes (Signed)
Blood pressure this morning equals 190/80. Patient due for dialysis this morning, no antihypertensive given. Will continue to monitor.

## 2014-01-24 NOTE — Progress Notes (Signed)
Subjective: Mr. Binns was seen in his HD today. He reports that his eye pain is a bit worse and still cannot open his [name]. He continues to report of diplopia.    Objective: Vital signs in last 24 hours: Filed Vitals:   01/24/14 1030 01/24/14 1100 01/24/14 1115 01/24/14 1128  BP: 144/74 157/79 151/75 167/75  Pulse: 79 85 87 84  Temp:   98.2 F (36.8 C)   TempSrc:   Oral   Resp:   18   Height:      Weight:    56.5 kg (124 lb 9 oz)  SpO2:       Weight change: 0.443 kg (15.6 oz)  Intake/Output Summary (Last 24 hours) at 01/24/14 1535 Last data filed at 01/24/14 1300  Gross per 24 hour  Intake    700 ml  Output   2000 ml  Net  -1300 ml   General: resting in bed in NAD  HEENT: less periorbital swelling on L compared to R from initial presentation; complete ptosis of [name]; binocular diplopia, EOM in L eye continue to be limited and L abduction may be more grossly intact L and R eye pupils continue to be nonreactive to light and dilated likely from dilating agent from optho's exam on 10/3.   Cardiac: RRR, no rubs, murmurs or gallops  Pulm: clear to auscultation bilaterally, moving normal volumes of air  Abd: soft, nontender, nondistended, BS present  Ext: warm and well perfused, no pedal edema.  Neuro: alert and oriented X3, responding appropriately, following commands. Gait not assessed today.    Lab Results: Results for DEMONTREZ, RINDFLEISCH (MRN [id number]) as of 01/24/2014 15:36  Ref. Range 01/24/2014 07:00  Sodium Latest Range: 136-145 mEq/L 134 (L)  Potassium Latest Range: 3.5-5.1 mEq/L 4.0  Chloride Latest Range: 96-112 mEq/L 94 (L)  CO2 Latest Range: 19-32 mEq/L 25  BUN Latest Range: 7.0-26.0 mg/dL 38 (H)  Creatinine Latest Range: 0.50-1.35 mg/dL 8.74 (H)  Calcium Latest Range: 8.4-10.5 mg/dL 8.9  GFR calc non Af Amer Latest Range: >90 mL/min 6 (L)  GFR calc Af Amer Latest Range: >90 mL/min 7 (L)  Glucose Latest Range: 70-99 mg/dL 111 (H)  Anion gap Latest Range: 5-15   15  Phosphorus Latest Range: 2.3-4.6 mg/dL 4.7 (H)  Albumin Latest Range: 3.5-5.2 g/dL 3.2 (L)  WBC Latest Range: 4.0-10.5 K/uL 4.9  RBC Latest Range: 4.22-5.81 MIL/uL 4.15 (L)  Hemoglobin Latest Range: 13.0-17.0 g/dL 11.7 (L)  HCT Latest Range: 39.0-52.0 % 35.9 (L)  MCV Latest Range: 78.0-100.0 fL 86.5  MCH Latest Range: 26.0-34.0 pg 28.2  MCHC Latest Range: 30.0-36.0 g/dL 32.6  RDW Latest Range: 11.5-15.5 % 15.9 (H)  Platelets Latest Range: 150-400 K/uL 177   Micro Results: Recent Results (from the past 240 hour(s))  CULTURE, BLOOD (ROUTINE X 2)     Status: None   Collection Time    01/19/14  7:35 PM      Result Value Ref Range Status   Specimen Description BLOOD RIGHT ARM   Final   Special Requests BOTTLES DRAWN AEROBIC ONLY 10 CC   Final   Culture  Setup Time     Final   Value: 01/20/2014 00:48     Performed at [facility]   Culture     Final   Value:        BLOOD CULTURE RECEIVED NO GROWTH TO DATE CULTURE WILL BE HELD FOR 5 DAYS BEFORE ISSUING A FINAL NEGATIVE REPORT  Performed at [facility]   Report Status PENDING   Incomplete  CULTURE, BLOOD (ROUTINE X 2)     Status: None   Collection Time    01/19/14  7:37 PM      Result Value Ref Range Status   Specimen Description BLOOD RIGHT ARM   Final   Special Requests BOTTLES DRAWN AEROBIC AND ANAEROBIC 10 CC   Final   Culture  Setup Time     Final   Value: 01/20/2014 00:48     Performed at [facility]   Culture     Final   Value:        BLOOD CULTURE RECEIVED NO GROWTH TO DATE CULTURE WILL BE HELD FOR 5 DAYS BEFORE ISSUING A FINAL NEGATIVE REPORT     Performed at [facility]   Report Status PENDING   Incomplete   Studies/Results: No results found. Medications:  Scheduled Meds: . calcium acetate  1,334 mg Oral TID WC  . doxercalciferol  2 mcg Intravenous Q T,Th,Sa-HD  . ferric gluconate (FERRLECIT/NULECIT) IV  62.5 mg Intravenous Q Thu-HD  . heparin  5,000 Units Subcutaneous 3  times per day  . hydrALAZINE  100 mg Oral BID  . levETIRAcetam  1,000 mg Oral QHS  . methylPREDNISolone (SOLU-MEDROL) injection  60 mg Intravenous Daily  . multivitamin  1 tablet Oral QHS  . pantoprazole  40 mg Oral Daily  . sodium chloride  3 mL Intravenous Q12H  . sodium chloride  3 mL Intravenous Q12H   Continuous Infusions:  PRN Meds:.sodium chloride, acetaminophen, acetaminophen, calcium acetate, diphenhydrAMINE, HYDROcodone-acetaminophen, HYDROmorphone (DILAUDID) injection, promethazine, sodium chloride  Assessment/Plan: Principal Problem:   Optic neuritis, left Active Problems:   Hypertension   Diabetes mellitus without complication   ESRD on hemodialysis   Seizure disorder   Normocytic anemia   Cocaine abuse   Tobacco abuse  Optic perineuritis, left: Based on MRI/MRA of head and orbit on 10/05, no evidence of cavernous sinus thrombosis was  seen and mild distention of L optic nerve sheath was noted. According to Dr. Posey Pronto, the significant inflammation of the perineural sheath of the L eye is likely consistent with optic perineuritis, a rare variant of orbital pseudotumor. ACE level is normal, RPR is NR, ANCA antibodies negative.  - continue to appreciate ophthalmology recommendations  - continue 60 mg IV methylprednisolone daily 10/6 as per ophthalmology recs and decide on transitioning to 80 mg PO prednisone before discharge - discontinued vancomycin and ceftriaxone 10/6   - continue Norco/Vicodin 5-325 mg q4h PRN pain  - continue 0.5 Dilaudid IV q4h PRN pain   Hypertension: stable. I personally performed orthostatic vitals today, 10/08. Supine BP was 185/80 to sitting was 165/85 and to standing was 130/70. Patient reported feeling dizzy upon standing and meets orthostatic hypotension criteria. Does not seem this is medication-induced but hydralazine is a potential culprit given its vasodilatory effects.  --continue home hydralazine   Diabetes Mellitus: Hgb A1c 5.0 July  2014. Serum BG 165. [name] not currently on treatment. Stable.  - may need to start SSI given initiation of steroids  - continue to monitor CBGs daily   ESRD on hemodialysis, TThS schedule: received HD today, 10/8 - HD per nephrology   Cocaine abuse: patient has a long history of cocaine abuse with recent use being about three days ago before presentation.  - counseled on cessation   Tobacco abuse:  - counseled on cessation   Seizure disorder:  - continue home  Keppra   VTE ppx: heparin   Diet: renal diet with 1200 mL fluid restriction   This is a Careers information officer Note.  The care of the patient was discussed with Dr. Megan Salon and Dr. Redmond Pulling  and the assessment and plan formulated with their assistance.  Please see their attached note for official documentation of the daily encounter.   LOS: 5 days   Josiah Lobo, Med Student 01/24/2014, 3:35 PM

## 2014-01-25 DIAGNOSIS — E43 Unspecified severe protein-calorie malnutrition: Secondary | ICD-10-CM | POA: Insufficient documentation

## 2014-01-25 LAB — GLUCOSE, CAPILLARY: Glucose-Capillary: 113 mg/dL — ABNORMAL HIGH (ref 70–99)

## 2014-01-25 MED ORDER — BOOST / RESOURCE BREEZE PO LIQD
1.0000 | Freq: Three times a day (TID) | ORAL | Status: DC
  Administered 2014-01-25: 12:00:00 via ORAL
  Administered 2014-01-26 – 2014-01-29 (×4): 1 via ORAL

## 2014-01-25 MED ORDER — HYDRALAZINE HCL 50 MG PO TABS
50.0000 mg | ORAL_TABLET | Freq: Two times a day (BID) | ORAL | Status: DC
  Administered 2014-01-25 – 2014-01-27 (×6): 50 mg via ORAL
  Filled 2014-01-25 (×9): qty 1

## 2014-01-25 NOTE — Progress Notes (Signed)
Subjective: [name] was seen in his room today. He reports that his eye feels less sore than compared to initial presentation. He continues to address the inability to open his left eyelid as well as double vision. He also feels dizzy when he stands up and says that he takes his hydralazine as directed when he is at home.   Objective: Vital signs in last 24 hours: Filed Vitals:   01/24/14 1128 01/24/14 1700 01/24/14 2031 01/25/14 0415  BP: 167/75 178/84 162/76 173/75  Pulse: 84 77 72 71  Temp:   98 F (36.7 C) 98.3 F (36.8 C)  TempSrc:   Oral Oral  Resp:   18 17  Height:   5\' 9"  (1.753 m)   Weight: 56.5 kg (124 lb 9 oz)  56.5 kg (124 lb 9 oz)   SpO2:   99% 93%   Weight change: -1.243 kg (-2 lb 11.8 oz)  Intake/Output Summary (Last 24 hours) at 01/25/14 0804 Last data filed at 01/24/14 1700  Gross per 24 hour  Intake    600 ml  Output   2000 ml  Net  -1400 ml   General: resting in bed in NAD  HEENT: less periorbital swelling on L compared to R from initial presentation; complete ptosis of [name]; binocular diplopia, EOM in L eye continue to be limited and L abduction may be more grossly intact L and R eye pupils continue to be nonreactive to light and dilated likely from dilating agent from optho's exam on 10/3.   Cardiac: RRR, no rubs, murmurs or gallops  Pulm: clear to auscultation bilaterally, moving normal volumes of air  Abd: soft, nontender, nondistended, BS present  Ext: warm and well perfused, no pedal edema.  Neuro: alert and oriented X3, responding appropriately, following commands. Gait not assessed today.    Lab Results: Results for [name], [name] (MRN [id number]) as of 01/25/2014 11:55  Ref. Range 01/24/2014 07:00  Sodium Latest Range: 136-145 mEq/L 134 (L)  Potassium Latest Range: 3.5-5.1 mEq/L 4.0  Chloride Latest Range: 96-112 mEq/L 94 (L)  CO2 Latest Range: 19-32 mEq/L 25  BUN Latest Range: 7.0-26.0 mg/dL 38 (H)  Creatinine Latest Range: 0.50-1.35 mg/dL  8.74 (H)  Calcium Latest Range: 8.4-10.5 mg/dL 8.9  GFR calc non Af Amer Latest Range: >90 mL/min 6 (L)  GFR calc Af Amer Latest Range: >90 mL/min 7 (L)  Glucose Latest Range: 70-99 mg/dL 111 (H)  Anion gap Latest Range: 5-15  15  Phosphorus Latest Range: 2.3-4.6 mg/dL 4.7 (H)  Albumin Latest Range: 3.5-5.2 g/dL 3.2 (L)  WBC Latest Range: 4.0-10.5 K/uL 4.9  RBC Latest Range: 4.22-5.81 MIL/uL 4.15 (L)  Hemoglobin Latest Range: 13.0-17.0 g/dL 11.7 (L)  HCT Latest Range: 39.0-52.0 % 35.9 (L)  MCV Latest Range: 78.0-100.0 fL 86.5  MCH Latest Range: 26.0-34.0 pg 28.2  MCHC Latest Range: 30.0-36.0 g/dL 32.6  RDW Latest Range: 11.5-15.5 % 15.9 (H)  Platelets Latest Range: 150-400 K/uL 177   Medications:  Scheduled Meds: . calcium acetate  1,334 mg Oral TID WC  . doxercalciferol  2 mcg Intravenous Q T,Th,Sa-HD  . ferric gluconate (FERRLECIT/NULECIT) IV  62.5 mg Intravenous Q Thu-HD  . heparin  5,000 Units Subcutaneous 3 times per day  . hydrALAZINE  100 mg Oral BID  . levETIRAcetam  1,000 mg Oral QHS  . methylPREDNISolone (SOLU-MEDROL) injection  60 mg Intravenous Daily  . multivitamin  1 tablet Oral QHS  . pantoprazole  40 mg Oral Daily  . sodium  chloride  3 mL Intravenous Q12H  . sodium chloride  3 mL Intravenous Q12H   Continuous Infusions:  PRN Meds:.sodium chloride, acetaminophen, acetaminophen, calcium acetate, diphenhydrAMINE, HYDROcodone-acetaminophen, HYDROmorphone (DILAUDID) injection, promethazine, sodium chloride  Assessment/Plan: Principal Problem:   Optic neuritis, left Active Problems:   Hypertension   Diabetes mellitus without complication   ESRD on hemodialysis   Seizure disorder   Normocytic anemia   Cocaine abuse   Tobacco abuse  Optic perineuritis, left: Based on MRI/MRA of head and orbit on 10/05, no evidence of cavernous sinus thrombosis was seen and mild distention of L optic nerve sheath was noted. According to Dr. 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 ACE level is normal, RPR is NR, ANCA antibodies negative. Dr. Tessa Lerner had mentioned a case report of recurrent optic perineuritis after intranasal cocaine abuse (Coppens et al. Neuro-Ophthalmology 30(2): [phone]) though patient reports smoking cocaine and very occasional use of it intranasally. Patient's MRI had noted minimal edema in the base of L maxillary sinus and CT maxillary from 10/4 noted a soft density at L maxilla eye with destructive changes of posterior molar, suggesting peridontal inflammation and with a further discussion with radiologist on 10/5, there are likely other abnormalities in his oral cavity including dental caries in his R molars and loose dental roots and given this, one can consider this serving as nidus of infection for his presentation.  - continue to appreciate ophthalmology recommendations  - continue 60 mg IV methylprednisolone daily since 10/6 as per ophthalmology recs and decide on transitioning to 80 mg PO prednisone before discharge - consider eye patch to help with patient's vision as patient reports binocular and has nearly complete ptosis of his [name]  - discontinued vancomycin and ceftriaxone 10/6   - continue Norco/Vicodin 5-325 mg q4h PRN pain  - continue 0.5 Dilaudid IV q4h PRN pain   Hypertension: I personally performed orthostatic vitals yesterday, 10/08. Supine BP was 185/80 to sitting was 165/85 and to standing was 130/70. Patient reported feeling dizzy upon standing and meets orthostatic hypotension criteria. Based on the review of his medications, hydralazine is a potential culprit given its vasodilatory effects.  --decreased hydralazine dose from 100 mg to 50 mg BID  --may consider switching to another antihypertensive if patient continues to have orthostasis  Diabetes Mellitus: Hgb A1c 5.0 July 2014. Serum BG 165. [name] not currently  on treatment.  - may need to start SSI given initiation of steroids  - continue to monitor CBGs daily   ESRD on hemodialysis, TThS schedule: will be receiving HD tomorrow, 10/10.  - HD per nephrology   Cocaine abuse: patient has a long history of cocaine abuse with recent use being about three days ago before presentation.  - counseled on cessation   Seizure disorder:  - continue home Keppra   VTE ppx: heparin   Diet: renal diet with 1200 mL fluid restriction   This is a Careers information officer Note.  The care of the patient was discussed with Dr. Megan Salon and Dr. Redmond Pulling and the assessment and plan formulated with their assistance. Please see their attached note for official documentation of the daily encounter.   LOS: 6 days   [name], Med Student 01/25/2014, 8:04 AM

## 2014-01-25 NOTE — Progress Notes (Signed)
Subjective: [name] was seen and examined this morning.  He reports his eye is "less sore" today.  He continues to experience diplopia, difficulty opening the left eye and limited movement of the left eye.  Objective: Vital signs in last 24 hours: Filed Vitals:   01/24/14 1128 01/24/14 1700 01/24/14 2031 01/25/14 0415  BP: 167/75 178/84 162/76 173/75  Pulse: 84 77 72 71  Temp:   98 F (36.7 C) 98.3 F (36.8 C)  TempSrc:   Oral Oral  Resp:   18 17  Height:   5\' 9"  (1.753 m)   Weight: 124 lb 9 oz (56.5 kg)  124 lb 9 oz (56.5 kg)   SpO2:   99% 93%   Weight change: -2 lb 11.8 oz (-1.243 kg)  Intake/Output Summary (Last 24 hours) at 01/25/14 0753 Last data filed at 01/24/14 1700  Gross per 24 hour  Intake    600 ml  Output   2000 ml  Net  -1400 ml   Vitals reviewed. General: resting in bed in NAD HEENT: Pupils equal bilateral and NR, limited left eye adduction, left eye appears down and out, swelling has improved Cardiac: RRR, no rubs, murmurs or gallops Pulm: clear to auscultation bilaterally, no wheezes, rales, or rhonchi Abd: soft, nontender, nondistended, BS present Ext: warm and well perfused, no pedal edema Neuro: alert and oriented X3, follows commands appropriately  Lab Results:  CBG:  Recent Labs Lab 01/20/14 0819 01/21/14 0752 01/23/14 0745 01/24/14 1153  GLUCAP 101* 124* 108* 138*    Medications: I have reviewed the patient's current medications. Scheduled Meds: . calcium acetate  1,334 mg Oral TID WC  . doxercalciferol  2 mcg Intravenous Q T,Th,Sa-HD  . ferric gluconate (FERRLECIT/NULECIT) IV  62.5 mg Intravenous Q Thu-HD  . heparin  5,000 Units Subcutaneous 3 times per day  . hydrALAZINE  100 mg Oral BID  . levETIRAcetam  1,000 mg Oral QHS  . methylPREDNISolone (SOLU-MEDROL) injection  60 mg Intravenous Daily  . multivitamin  1 tablet Oral QHS  . pantoprazole  40 mg Oral Daily  . sodium chloride  3 mL Intravenous Q12H  . sodium chloride  3 mL  Intravenous Q12H   Continuous Infusions:  PRN Meds:.sodium chloride, acetaminophen, acetaminophen, calcium acetate, diphenhydrAMINE, HYDROcodone-acetaminophen, HYDROmorphone (DILAUDID) injection, promethazine, sodium chloride  Assessment/Plan: 58 year old man with PMH of DM2, ESRD on HD, HTN, cocaine abuse, seizure disorder, presenting with painful ophthalmoplegia of the left eye.  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   His pain is improving with steroid treatment however he still has limited EOM and diplopia on exam.   - will continue high steroid tx with solumedrol IV 60mg , will need PO steroid with slow taper over 30-60 days; I will contact ophthalmology for their input regarding switching to po steroid since I am unable to find clear guidelines for po vs IV treatment in optic perineuritis. - continue Vicodin PRN and Dilaudid IV PRN for pain - appreciate ophthalmology recommendations  ESRD: On HD on T/Th/Sat. - HD per nephrology   HTN: He reports lightheadedness and has positive orthostatic vitals.  He says he is compliant with trx at home, however he also uses cocaine and may be used to slightly high BPs.   - he continues to have periods of HTN and continues to need treatment; will decrease hydralazine to 50mg  BID and see if his symptoms improve.  DM2: Diet controlled. Fasting CBG 113 this  AM.   Seizure disorder: Stable. Continue home Lely.   Tobacco use: Counseled on cessation.  Declined nicotine patch.   VTE prophylaxis: Heparin TID   FEN:  NSL Renal panel in am Renal diet   Dispo: Disposition is deferred at this time, awaiting improvement of current medical problems.  Anticipated discharge in approximately 1-2 day(s).    The patient does not have a current PCP and does need an [facility] hospital follow-up appointment after discharge.  The patient does not have transportation limitations that hinder transportation to  clinic appointments.  .Services Needed at time of discharge: Y = Yes, Blank = No PT:  Y  OT:   RN:   Equipment:  rolling walker with 5 inch wheels  Other:     LOS: 6 days   Francesca Oman, DO 01/25/2014, 7:53 AM

## 2014-01-25 NOTE — Progress Notes (Signed)
INITIAL NUTRITION ASSESSMENT  Pt meets criteria for SEVERE MALNUTRITION in the context of chronic illness as evidenced by a 11% weight loss in 6 months and severe fat and muscle mass loss.  DOCUMENTATION CODES Per approved criteria  -Severe malnutrition in the context of chronic illness -Underweight   INTERVENTION: Provide HS nourishment snack ([place] sandwich). Ordered  Continue Resource Breeze po TID, each supplement provides 250 kcal and 9 grams of protein  Encourage PO intake  NUTRITION DIAGNOSIS: Malnutrition related to chronic illness as evidenced by severe fat and muscle mass loss.   Goal: Pt to meet >/= 90% of their estimated nutrition needs   Monitor:  PO intake, weight trends, labs, I/O's  Reason for Assessment: Low BMI  58 y.o. male  Admitting Dx: Optic neuritis, left  ASSESSMENT: Pt with history of hypertension, ESRD (TTS dialysis), and prior GI bleeds who presents with L eye pain and associated discharge and swelling as well as a L-sided headache. The patient says that the pain in his left eye started three days upon waking up in the morning and describes it as "throbbing."  Meal completion is 100%. Pt reports he has been eating good currently and while at home, however during visit pt reports having some nausea and not wanting anything to eat. Pt reports he has been losing weight and does not know the reasoning behind his weight loss. Pt with a 11% weight loss in 6 months. Pt was offered supplements, but refused all. Pt is willing to have a bedtime snack of a [place] sandwich. Will order. Pt was encouraged to continue eating his food at meals.   Nutrition Focused Physical Exam:  Subcutaneous Fat:  Orbital Region: N/A Upper Arm Region: Moderate depletion Thoracic and Lumbar Region: Severe depletion  Muscle:  Temple Region: Severe depletion Clavicle Bone Region: Severe depletion Clavicle and Acromion Bone Region: Severe depletion Scapular Bone Region:  N/A Dorsal Hand: Moderate depletion Patellar Region: Severe depletion Anterior Thigh Region: Severe depletion Posterior Calf Region: Severe depletion  Edema: none  Labs: Low sodium, chloride, GFR. High BUN, creatinine, and phosphorous (4.7).  Height: Ht Readings from Last 1 Encounters:  01/24/14 5\' 9"  (1.753 m)    Weight: Wt Readings from Last 1 Encounters:  01/24/14 124 lb 9 oz (56.5 kg)    Ideal Body Weight: 160 lbs  % Ideal Body Weight: 78%  Wt Readings from Last 10 Encounters:  01/24/14 124 lb 9 oz (56.5 kg)  11/09/13 127 lb 6.4 oz (57.788 kg)  11/07/13 131 lb 9.6 oz (59.693 kg)  08/31/13 129 lb 8 oz (58.741 kg)  08/26/13 132 lb 15 oz (60.3 kg)  08/26/13 132 lb 15 oz (60.3 kg)  08/01/13 128 lb 6.4 oz (58.242 kg)  07/25/13 140 lb (63.504 kg)  07/07/13 135 lb (61.236 kg)  06/24/13 139 lb 12.4 oz (63.4 kg)   Usual Body Weight: 140 lbs  % Usual Body Weight: 89%  BMI:  Body mass index is 18.39 kg/(m^2). Underweight  Adjusted body weight: 61.6 kg  Estimated Nutritional Needs: Kcal: 7062 - 2100  Protein: at least 87 g daily  Fluid: 1.2 liters  Skin: intact  Diet Order: Renal with 1200 ml fluids  EDUCATION NEEDS: -No education needs identified at this time   Intake/Output Summary (Last 24 hours) at 01/25/14 0903 Last data filed at 01/25/14 0825  Gross per 24 hour  Intake    720 ml  Output   2000 ml  Net  -1280 ml    Last  BM: 10/6  Labs:   Recent Labs Lab 01/20/14 0503 01/22/14 0500 01/24/14 0700  NA 136* 131* 134*  K 3.9 4.1 4.0  CL 94* 89* 94*  CO2 29 25 25   BUN 20 44* 38*  CREATININE 5.98* 10.29* 8.74*  CALCIUM 8.2* 8.4 8.9  PHOS  --  6.5* 4.7*  GLUCOSE 106* 93 111*    CBG (last 3)   Recent Labs  01/23/14 0745 01/24/14 1153  GLUCAP 108* 138*    Scheduled Meds: . calcium acetate  1,334 mg Oral TID WC  . doxercalciferol  2 mcg Intravenous Q T,Th,Sa-HD  . ferric gluconate (FERRLECIT/NULECIT) IV  62.5 mg Intravenous Q Thu-HD   . heparin  5,000 Units Subcutaneous 3 times per day  . hydrALAZINE  100 mg Oral BID  . levETIRAcetam  1,000 mg Oral QHS  . methylPREDNISolone (SOLU-MEDROL) injection  60 mg Intravenous Daily  . multivitamin  1 tablet Oral QHS  . pantoprazole  40 mg Oral Daily  . sodium chloride  3 mL Intravenous Q12H  . sodium chloride  3 mL Intravenous Q12H    Continuous Infusions:   Past Medical History  Diagnosis Date  . Hypertension   . Anemia, chronic disease   . CHF (congestive heart failure)     diastolic.  EF 60 - 65% per [facility] eco 11/2011  . Cocaine abuse     mentioned in notes from [place]  . Hepatitis C antibody test positive     was HIV negative, 02/28/12  . Hepatitis B core antibody positive     03/01/10  . Positive QuantiFERON-TB Gold test     11/2011  . Helicobacter pylori gastritis     not defined if this was treated  . Polyp of colon, adenomatous     May 2012.  Dr Trenton Founds in [place]  . Shortness of breath   . Hematochezia   . Head injury, closed, with concussion   . History of blood transfusion     "last one was 2 days ago" (11/09/2012)  . Type II diabetes mellitus     on oral pills only  . Arthritis     "right shoulder" (11/09/2012)  . Seizure disorder     questionable history of - will need to clarify with PCP  . Headache(784.0)     "q other day" (11/09/2012)  . ESRD on hemodialysis since 2012    Patient says he started HD in 2012.  ESRD was due to "drugs", primarily used cocaine.  Has 3-5 year hx of HTN, no DM.  Gets HD on TTS schedule at [facility] [facility].  [name] from [place] and recently moved here.    . Hypercalcemia   . Hyperpotassemia   . Seizures     Past Surgical History  Procedure Laterality Date  . Shoulder open rotator cuff repair Right   . Total knee arthroplasty Left   . Bascilic vein transposition  03/07/2012    Procedure: BASCILIC VEIN TRANSPOSITION;  Surgeon: Conrad St. Marys, MD;  Location: [place];  Service: Vascular;  Laterality: Left;  First  Stage  . Insertion of dialysis catheter      right chest  . Bascilic vein transposition Left 05/31/2012    Procedure: BASCILIC VEIN TRANSPOSITION;  Surgeon: Conrad Greenfield, MD;  Location: [place];  Service: Vascular;  Laterality: Left;  Left 2nd Stage Basilic Vein Transposition with gortex graft revision using 73mmx10cm graft  . Shoulder open rotator cuff repair Right   . [name]  capsule study N/A 08/27/2013    Procedure: [name] CAPSULE STUDY;  Surgeon: Milus Banister, MD;  Location: [place];  Service: Endoscopy;  Laterality: N/A;    Kallie Locks, MS, RD, Provisional LDN Pager # [phone] After hours/ weekend pager # [phone]

## 2014-01-25 NOTE — Care Management Note (Signed)
CARE MANAGEMENT NOTE 01/25/2014  Patient:  Tuohy,Phi   Account Number:  [id number]  Date Initiated:  01/23/2014  Documentation initiated by:  Lizabeth Leyden  Subjective/Objective Assessment:   admitted with cellulitis of left eye     Action/Plan:   progression of care and discharge planning  01/25/2014 Pt will d/c to home with friends, no d/c needs identified.  CRoyal RN MPH   Anticipated DC Date:  01/26/2014   Anticipated DC Plan:  [place]  CM consult      Choice offered to / List presented to:             Status of service:   Medicare Important Message given?  YES (If response is "NO", the following Medicare IM given date fields will be blank) Date Medicare IM given:  01/23/2014 Medicare IM given by:  Lizabeth Leyden Date Additional Medicare IM given:  01/25/2014 Additional Medicare IM given by:  [facility]  Discharge Disposition:    Per UR Regulation:    If discussed at Long Length of Stay Meetings, dates discussed:    Comments:  01/23/2014 Emma, [place] Met with patient regarding medical follow up and home health/DME.  His PCP Dr Mercy Moore, pharmacy [facility] on [address]. No issues with transportation.  Discussed home health services if MD orders for PT and OT. The patient declined any home health services. Asked about use of RW. He declined RW.

## 2014-01-25 NOTE — Progress Notes (Signed)
Physical Therapy Treatment Patient Details Name: [name] MRN: [id number] DOB: August 14, 1955 Today's Date: 01/25/2014    History of Present Illness History of Present Illness: [name] is a 58 year old African American male with a history of hypertension, ESRD (TTS dialysis), and prior GI bleeds who presents with L eye pain and associated discharge and swelling as well as a L-sided headache.  Pt dx with Optic perineuritis.      PT Comments    Pt continues to have lightheadedness with gait and is open to and agreeable with using a single point cane at home (he refused a RW which is preferred for it's stability).  Pt would also benefit from f/u therapy at discharge which he is also refusing.  PT will continue to follow acutely and plan stair training with a cane next session.    Follow Up Recommendations  Home health PT     Equipment Recommendations  Rolling walker with 5" wheels (pt refusing RW, but agreeable to cane. [name] is not preferred)    Recommendations for Other Services   None     Precautions / Restrictions Precautions Precautions: Fall Precaution Comments: Pt unsteady on his feet and reporting lightheadedness with gait.     Mobility   Transfers Overall transfer level: Needs assistance Equipment used: Straight cane Transfers: Sit to/from Stand Sit to Stand: Supervision         General transfer comment: relies on hands for transitions.   Ambulation/Gait Ambulation/Gait assistance: Min guard Ambulation Distance (Feet): 85 Feet Assistive device: Straight cane Gait Pattern/deviations: Step-through pattern;Decreased stance time - left;Decreased weight shift to left Gait velocity: decreased Gait velocity interpretation: Below normal speed for age/gender General Gait Details: Pt with abnormal gait pattern, staggierng, almost antalgic. I asked if he had any left knee issues (as this knee doesn't look like it has a patella and he denied any issues other than  "arthritis"). Even using the cane he is reaching for both objects in room and hallway railing for stability.         Balance Overall balance assessment: Needs assistance Sitting-balance support: Feet supported;No upper extremity supported Sitting balance-Leahy Scale: Good     Standing balance support: No upper extremity supported;Bilateral upper extremity supported;Single extremity supported Standing balance-Leahy Scale: Fair                      Cognition Arousal/Alertness: Awake/alert Behavior During Therapy: Agitated (mildly aggitated) Overall Cognitive Status: Within Functional Limits for tasks assessed                             Pertinent Vitals/Pain Pain Assessment: 0-10 Pain Score: 8  Pain Location: eye, left Pain Descriptors / Indicators: Aching Pain Intervention(s): Limited activity within patient's tolerance;Monitored during session;Repositioned           PT Goals (current goals can now be found in the care plan section) Acute Rehab PT Goals Patient Stated Goal: regain eyesight Progress towards PT goals: Progressing toward goals    Frequency  Min 3X/week    PT Plan Current plan remains appropriate       End of Session   Activity Tolerance: Other (comment) (limited by lightheadedness) Patient left: in chair;with call bell/phone within reach;with family/visitor present     Time: 0347-4259 PT Time Calculation (min): 9 min  Charges:  $Gait Training: 8-22 mins            [name] B. Edrie Ehrich, PT,  DPT #[phone]   01/25/2014, 4:27 PM

## 2014-01-25 NOTE — Progress Notes (Signed)
Subjective:  Tolerated hd yest , eye pain continues decreased with pain med  Objective Vital signs in last 24 hours: Filed Vitals:   01/24/14 1128 01/24/14 1700 01/24/14 2031 01/25/14 0415  BP: 167/75 178/84 162/76 173/75  Pulse: 84 77 72 71  Temp:   98 F (36.7 C) 98.3 F (36.8 C)  TempSrc:   Oral Oral  Resp:   18 17  Height:   5\' 9"  (1.753 m)   Weight: 56.5 kg (124 lb 9 oz)  56.5 kg (124 lb 9 oz)   SpO2:   99% 93%   Weight change: -1.243 kg (-2 lb 11.8 oz) Physical Exam: General appearance: awoken from sleep then  Alert,NAD, L unable to open  Resp: CTA  bilat  Cardio: RRR no murmur or rub or gallop GI: + BS, soft and nontender , nondistended Extremities: No pedal  edema  Access: AVF @ LUA with BFR 400   Dialysis Orders: TTS @ [facility]  58.5 kg 4 hrs 2K/2.25Ca 400/A1.5 No Heparin AVF @ LUA  Hectorol 2Aranesp 80 mcg 9/29 and Venofer 50 mg on Thurs.   Problem/Plan: 1. Left eye pain - pain controlled with  Dilaudid, swelling down; MRI 10/6  suggesting optic perineuritis, per Ophthalmology and ID; antibiotics dc'd, IV steroids.  2. ESRD - HD on TTS @ [facility] he in am.  3. HTN/volume - BP 173/75  on Hydralazine 100 mg bid, was also on Irbesartan 150 mg qd as outpatient; below edw 4. Anemia - Hgb 11.7, Aranesp on hold, continue weekly Fe.  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  6. Nutrition - Alb 3.2, renal diet, multivitamin. Add breeze supplement 7. Hx Dm Type 2 -  8. Hepatitis C  9. Hx seizure disorder - on Keppra.  10. Hx cocaine abuse - admits smoking crack 3 days prior to admission.AND He admits INTRANASAL Cocaine use and this  could be some etiology of his optic perineruritis  Ernest Haber, PA-C [place] [phone] 01/25/2014,8:27 AM  LOS: 6 days   Pt seen, examined and agree w A/P as above.  Kelly Splinter MD pager [phone]    cell [phone] 01/25/2014, 1:05 PM    Labs: Basic Metabolic  Panel:  Recent Labs Lab 01/20/14 0503 01/22/14 0500 01/24/14 0700  NA 136* 131* 134*  K 3.9 4.1 4.0  CL 94* 89* 94*  CO2 29 25 25   GLUCOSE 106* 93 111*  BUN 20 44* 38*  CREATININE 5.98* 10.29* 8.74*  CALCIUM 8.2* 8.4 8.9  PHOS  --  6.5* 4.7*   Liver Function Tests:  Recent Labs Lab 01/20/14 01/20/14 0503 01/22/14 0500 01/24/14 0700  AST 21 19  --   --   ALT 13 11  --   --   ALKPHOS 65 63  --   --   BILITOT 0.3 0.2*  --   --   PROT 8.4* 7.9  --   --   ALBUMIN 3.3* 3.3* 3.4* 3.2*   No results found for this basename: LIPASE, AMYLASE,  in the last 168 hours No results found for this basename: AMMONIA,  in the last 168 hours CBC:  Recent Labs Lab 01/19/14 0756 01/20/14 01/20/14 0503 01/22/14 0500 01/24/14 0700  WBC 2.9* 3.4* 4.5 3.9* 4.9  NEUTROABS 1.5*  --   --   --   --   HGB 14.5 12.9* 12.4* 12.6* 11.7*  HCT 43.1 39.3 38.2* 37.9*  35.9*  MCV 85.7 86.6 87.2 86.7 86.5  PLT 219 218 208 204 177   Cardiac Enzymes:  Recent Labs Lab 01/19/14 0756 01/19/14 1310 01/19/14 1937 01/20/14  TROPONINI <0.30 <0.30 <0.30 <0.30   CBG:  Recent Labs Lab 01/20/14 0819 01/21/14 0752 01/23/14 0745 01/24/14 1153  GLUCAP 101* 124* 108* 138*    Studies/Results: No results found. Medications:   . calcium acetate  1,334 mg Oral TID WC  . doxercalciferol  2 mcg Intravenous Q T,Th,Sa-HD  . ferric gluconate (FERRLECIT/NULECIT) IV  62.5 mg Intravenous Q Thu-HD  . heparin  5,000 Units Subcutaneous 3 times per day  . hydrALAZINE  100 mg Oral BID  . levETIRAcetam  1,000 mg Oral QHS  . methylPREDNISolone (SOLU-MEDROL) injection  60 mg Intravenous Daily  . multivitamin  1 tablet Oral QHS  . pantoprazole  40 mg Oral Daily  . sodium chloride  3 mL Intravenous Q12H  . sodium chloride  3 mL Intravenous Q12H

## 2014-01-25 NOTE — Progress Notes (Signed)
Patient ID: [name], male   DOB: [birth year], 58 y.o.   MRN: [id number]        Attending progress note    Date of Admission:  01/19/2014     Principal Problem:   Optic neuritis, left Active Problems:   Hypertension   Diabetes mellitus without complication   ESRD on hemodialysis   Seizure disorder   Normocytic anemia   Cocaine abuse   Tobacco abuse   . calcium acetate  1,334 mg Oral TID WC  . doxercalciferol  2 mcg Intravenous Q T,Th,Sa-HD  . feeding supplement (RESOURCE BREEZE)  1 Container Oral TID BM  . ferric gluconate (FERRLECIT/NULECIT) IV  62.5 mg Intravenous Q Thu-HD  . heparin  5,000 Units Subcutaneous 3 times per day  . hydrALAZINE  50 mg Oral BID  . levETIRAcetam  1,000 mg Oral QHS  . methylPREDNISolone (SOLU-MEDROL) injection  60 mg Intravenous Daily  . multivitamin  1 tablet Oral QHS  . pantoprazole  40 mg Oral Daily  . sodium chloride  3 mL Intravenous Q12H  . sodium chloride  3 mL Intravenous Q12H    I have seen and examined Mr. Mcmanaman with our team today. [name] having less left thigh pain and swelling but still has gaze palsies and diplopia. We will continue steroids and discuss conversion to oral prednisone with Dr. Posey Pronto. He has some orthostatic hypotension and his hydralazine dose has been decreased.  Michel Bickers, MD [facility] [phone] pager   [phone] cell 01/25/2014, 12:30 PM

## 2014-01-25 NOTE — Progress Notes (Signed)
Occupational Therapy Treatment Patient Details Name: [name] MRN: [id number] DOB: 30-Apr-1955 Today's Date: 01/25/2014    History of present illness History of Present Illness: Mr. Collister is a 58 year old African American male with a history of hypertension, ESRD (TTS dialysis), and prior GI bleeds who presents with L eye pain and associated discharge and swelling as well as a L-sided headache.    OT comments  Pt with increased c/o dizziness and weakness. Vomited after ambulating with min A. L eye ptosis; c/o blurred vision with R eye also. At this time, do not feel pt can safely D/C home due to current mobility status, lack of social supports and declining home health.States he would like home health but that it would not be appropriate due to the people he lives with. Feel pt is appropriate to D/C to SNF in order to facilitate safe D/C home. Discussed with pt and he stated he would consider it.  Will continue to follow acutely.   Follow Up Recommendations  SNF;Supervision/Assistance - 24 hour    Equipment Recommendations  Tub/shower seat    Recommendations for Other Services      Precautions / Restrictions Precautions Precautions: Fall Precaution Comments: c/o dizziness       Mobility Bed Mobility Overal bed mobility: Needs Assistance Bed Mobility: Supine to Sit     Supine to sit: Supervision        Transfers Overall transfer level: Needs assistance Equipment used: Straight cane Transfers: Sit to/from Stand;Stand Pivot Transfers Sit to Stand: Min assist Stand pivot transfers: Min assist       General transfer comment: relies on hands for transitions.     Balance Overall balance assessment: Needs assistance Sitting-balance support: Feet supported;No upper extremity supported Sitting balance-Leahy Scale: Good     Standing balance support: During functional activity Standing balance-Leahy Scale: Poor Standing balance comment: required assistance for  maintaining balance                   ADL Overall ADL's : Needs assistance/impaired                                     Functional mobility during ADLs: Minimal assistance General ADL Comments: Pt with LOB during mobility, requiring assistance to recover. Discussed D/C plands, including concners of pt trying to D/C home in current living situaiton given deficits.       Vision                     Perception     Praxis      Cognition   Behavior During Therapy: Restless Overall Cognitive Status: No family/caregiver present to determine baseline cognitive functioning                       Extremity/Trunk Assessment               Exercises     Shoulder Instructions       General Comments      Pertinent Vitals/ Pain       Pain Assessment: Faces Pain Score: 8  Faces Pain Scale: Hurts little more Pain Location: eye Pain Descriptors / Indicators: Aching Pain Intervention(s): Monitored during session  Home Living  Prior Functioning/Environment              Frequency Min 2X/week     Progress Toward Goals  OT Goals(current goals can now be found in the care plan section)  Progress towards OT goals: Progressing toward goals  Acute Rehab OT Goals Patient Stated Goal: to be able to walk and not be dizzy OT Goal Formulation: With patient Time For Goal Achievement: 02/06/14 Potential to Achieve Goals: Good ADL Goals Pt Will Perform Grooming: with modified independence;standing Pt Will Perform Upper Body Bathing: with modified independence;sitting Pt Will Perform Lower Body Bathing: with modified independence;sit to/from stand Pt Will Perform Upper Body Dressing: with modified independence;sitting Pt Will Perform Lower Body Dressing: with modified independence;sit to/from stand Pt Will Transfer to Toilet: with modified independence;ambulating;regular height  toilet Pt Will Perform Toileting - Clothing Manipulation and hygiene: with modified independence;sit to/from stand Pt Will Perform Tub/Shower Transfer: Tub transfer;with modified independence;ambulating Additional ADL Goal #1: Monitor ptosis and vision in L eye and provide adaptations as appropriate.  Plan Discharge plan needs to be updated    Co-evaluation                 End of Session Equipment Utilized During Treatment: Gait belt   Activity Tolerance Patient tolerated treatment well   Patient Left in bed;with call bell/phone within reach   Nurse Communication Mobility status;Other (comment) (n/v)        Time: 6948-5462 OT Time Calculation (min): 26 min  Charges: OT General Charges $OT Visit: 1 Procedure OT Treatments $Self Care/Home Management : 8-22 mins $Therapeutic Activity: 8-22 mins  Karyl Sharrar,HILLARY 01/25/2014, 4:49 PM   [facility], OTR/L  [phone] 01/25/2014

## 2014-01-25 NOTE — Progress Notes (Signed)
  I have seen and examined the patient, and reviewed the daily progress note by Josiah Lobo, MS4 and discussed the care of the patient with them. Please see my progress note from 01/25/2014 for further details regarding assessment and plan.    Signed:  Francesca Oman, DO 01/25/2014, 9:18 PM

## 2014-01-26 DIAGNOSIS — H4600 Optic papillitis, unspecified eye: Secondary | ICD-10-CM

## 2014-01-26 DIAGNOSIS — E118 Type 2 diabetes mellitus with unspecified complications: Secondary | ICD-10-CM

## 2014-01-26 DIAGNOSIS — R569 Unspecified convulsions: Secondary | ICD-10-CM

## 2014-01-26 DIAGNOSIS — G903 Multi-system degeneration of the autonomic nervous system: Secondary | ICD-10-CM

## 2014-01-26 LAB — CULTURE, BLOOD (ROUTINE X 2)
CULTURE: NO GROWTH
Culture: NO GROWTH

## 2014-01-26 LAB — GLUCOSE, CAPILLARY: GLUCOSE-CAPILLARY: 121 mg/dL — AB (ref 70–99)

## 2014-01-26 LAB — RENAL FUNCTION PANEL
Albumin: 3.3 g/dL — ABNORMAL LOW (ref 3.5–5.2)
Anion gap: 15 (ref 5–15)
BUN: 47 mg/dL — ABNORMAL HIGH (ref 6–23)
CALCIUM: 9.2 mg/dL (ref 8.4–10.5)
CO2: 25 meq/L (ref 19–32)
CREATININE: 8.12 mg/dL — AB (ref 0.50–1.35)
Chloride: 92 mEq/L — ABNORMAL LOW (ref 96–112)
GFR calc Af Amer: 8 mL/min — ABNORMAL LOW (ref >90)
GFR, EST NON AFRICAN AMERICAN: 7 mL/min — AB (ref >90)
Glucose, Bld: 95 mg/dL (ref 70–99)
Phosphorus: 4 mg/dL (ref 2.3–4.6)
Potassium: 3.9 mEq/L (ref 3.7–5.3)
Sodium: 132 mEq/L — ABNORMAL LOW (ref 137–147)

## 2014-01-26 LAB — CBC
HEMATOCRIT: 36.1 % — AB (ref 39.0–52.0)
Hemoglobin: 11.9 g/dL — ABNORMAL LOW (ref 13.0–17.0)
MCH: 28.9 pg (ref 26.0–34.0)
MCHC: 33 g/dL (ref 30.0–36.0)
MCV: 87.6 fL (ref 78.0–100.0)
PLATELETS: 162 10*3/uL (ref 150–400)
RBC: 4.12 MIL/uL — ABNORMAL LOW (ref 4.22–5.81)
RDW: 15.7 % — ABNORMAL HIGH (ref 11.5–15.5)
WBC: 5 10*3/uL (ref 4.0–10.5)

## 2014-01-26 MED ORDER — PREDNISONE 50 MG PO TABS: 60.0000 mg | ORAL_TABLET | Freq: Every day | ORAL | Status: DC

## 2014-01-26 MED ORDER — HYDROMORPHONE HCL 1 MG/ML IJ SOLN
0.5000 mg | Freq: Three times a day (TID) | INTRAMUSCULAR | Status: DC | PRN
  Administered 2014-01-26 – 2014-01-27 (×3): 0.5 mg via INTRAVENOUS
  Filled 2014-01-26 (×3): qty 1

## 2014-01-26 MED ORDER — DOXERCALCIFEROL 4 MCG/2ML IV SOLN
INTRAVENOUS | Status: AC
  Filled 2014-01-26: qty 2

## 2014-01-26 MED ORDER — HYDRALAZINE HCL 50 MG PO TABS
50.0000 mg | ORAL_TABLET | Freq: Once | ORAL | Status: DC
  Filled 2014-01-26: qty 1

## 2014-01-26 MED ORDER — HYDROMORPHONE HCL 1 MG/ML IJ SOLN
INTRAMUSCULAR | Status: AC
  Filled 2014-01-26: qty 1

## 2014-01-26 MED ORDER — HYDROCODONE-ACETAMINOPHEN 5-325 MG PO TABS
ORAL_TABLET | ORAL | Status: AC
  Filled 2014-01-26: qty 2

## 2014-01-26 MED ORDER — PREDNISONE 50 MG PO TABS
60.0000 mg | ORAL_TABLET | Freq: Every day | ORAL | Status: DC
  Administered 2014-01-26 – 2014-01-29 (×4): 60 mg via ORAL
  Filled 2014-01-26 (×5): qty 1

## 2014-01-26 MED ORDER — HYDRALAZINE HCL 25 MG PO TABS: 25.0000 mg | ORAL_TABLET | Freq: Once | ORAL | Status: DC

## 2014-01-26 NOTE — Progress Notes (Signed)
Subjective: [name] was seen and examined this morning on HD.  He continues to experience eye pain and would like pain med.  He says his visual symptoms are unchanged.    Objective: Vital signs in last 24 hours: Filed Vitals:   01/26/14 1100 01/26/14 1130 01/26/14 1200 01/26/14 1204  BP: 141/63 132/63 168/69 165/74  Pulse: 78 67 68 64  Temp:    98.2 F (36.8 C)  TempSrc:    Oral  Resp: 12 13 15 14   Height:      Weight:    55.8 kg (123 lb 0.3 oz)  SpO2:    98%   Weight change: 1.107 kg (2 lb 7 oz)  Intake/Output Summary (Last 24 hours) at 01/26/14 1403 Last data filed at 01/26/14 1204  Gross per 24 hour  Intake    240 ml  Output   1996 ml  Net  -1756 ml   Vitals reviewed. General: resting in bed in NAD HEENT: L 8mm, R 37mm, both NR, limited left eye adduction, left eye appears down and out, swelling has improved Cardiac: RRR, no rubs, murmurs or gallops Pulm: clear to auscultation bilaterally, no wheezes, rales, or rhonchi Abd: soft, nontender, nondistended, BS present Ext: warm and well perfused, no pedal edema Neuro: alert and oriented X3, follows commands appropriately  Lab Results:  CBG:  Recent Labs Lab 01/20/14 0819 01/21/14 0752 01/23/14 0745 01/24/14 1153 01/25/14 0733 01/26/14 1304  GLUCAP 101* 124* 108* 138* 113* 121*    Medications: I have reviewed the patient's current medications. Scheduled Meds: . calcium acetate  1,334 mg Oral TID WC  . doxercalciferol      . doxercalciferol  2 mcg Intravenous Q T,Th,Sa-HD  . feeding supplement (RESOURCE BREEZE)  1 Container Oral TID BM  . ferric gluconate (FERRLECIT/NULECIT) IV  62.5 mg Intravenous Q Thu-HD  . heparin  5,000 Units Subcutaneous 3 times per day  . hydrALAZINE  50 mg Oral BID  . HYDROmorphone      . HYDROmorphone      . levETIRAcetam  1,000 mg Oral QHS  . multivitamin  1 tablet Oral QHS  . pantoprazole  40 mg Oral Daily  . sodium chloride  3 mL Intravenous Q12H  . sodium chloride  3 mL  Intravenous Q12H   Continuous Infusions:  PRN Meds:.sodium chloride, acetaminophen, acetaminophen, calcium acetate, diphenhydrAMINE, HYDROcodone-acetaminophen, HYDROmorphone (DILAUDID) injection, promethazine, sodium chloride  Assessment/Plan: 58 year old man with PMH of DM2, ESRD on HD, HTN, cocaine abuse, seizure disorder, presenting with painful ophthalmoplegia of the left eye.  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   He has had reduction in pain in recent days with steroid and pain meds.  I expect his pain should improve further with improvement in inflammation of the optic nerve sheath.  - continue Vicodin PRN; decrease IV dilaudid with hopes of transition to po only - appreciate ophthalmology recommendations; I have transitioned him to 60mg  po Prednisone; will plan to have him follow-up with Dr. Posey Pronto in her office on 10/14.  ESRD: On HD on T/Th/Sat. - HD per nephrology   Orthostatic hypotension: He reports lightheadedness and has positive orthostatic vitals.  He says he is compliant with hydralazine at home, however he also uses cocaine and may be used to slightly high BPs.  Nephrology note indicates he is below his EDW which is likely also contributing.  Deconditioning from hospital stay likely contributing.  He was able to ambulate with PT yesterday  but did experience lightheadedness.   - continue hydral at lower dose - ambulate with PT  - HD per nephrololgy  HTN:  Hydralazine 50mg  BID.  Nephrology note reports he is also on Irbesartan 150mg  as an outpatient.  I will confirm this with the patient.   - if he is indeed on Irbesartan I will resume at low dose 75mg  daily after I have checked his standing BP to be sure he can tolerate  DM2: Diet controlled. CBGs low 100s.  Seizure disorder: Stable. Continue home [place].   Tobacco use: Counseled on cessation.  Declined nicotine patch.   VTE prophylaxis: Heparin TID   FEN:  NSL Renal panel in  am Renal diet   Dispo: Disposition is deferred at this time, awaiting improvement of current medical problems.  Anticipated discharge in approximately 1-2 day(s).    The patient does not have a current PCP and does need an [facility] hospital follow-up appointment after discharge.  The patient does not have transportation limitations that hinder transportation to clinic appointments.  .Services Needed at time of discharge: Y = Yes, Blank = No PT:  Y  OT:   RN:   Equipment:  rolling walker with 5 inch wheels  Other:     LOS: 7 days   Francesca Oman, DO 01/26/2014, 2:03 PM

## 2014-01-26 NOTE — Procedures (Signed)
I was present at this dialysis session, have reviewed the session itself and made  appropriate changes  Kelly Splinter MD (pgr) [phone]    ([phone] 01/26/2014, 9:54 AM

## 2014-01-26 NOTE — Progress Notes (Signed)
Subjective:  On HD, no change in eye pain, asking for more pain meds  Objective Vital signs in last 24 hours: Filed Vitals:   01/26/14 0523 01/26/14 0745 01/26/14 0830 01/26/14 0900  BP: 177/73 190/85 181/80 148/75  Pulse: 69 72 78 65  Temp: 98.2 F (36.8 C)     TempSrc:      Resp: 17 16 15 12   Height:      Weight:  57.7 kg (127 lb 3.3 oz)    SpO2: 100% 100%     Weight change: 1.107 kg (2 lb 7 oz) Physical Exam: Gen: awoken from sleep then  Alert,NAD, L unable to open  Resp: CTA  bilat  Cardio: RRR no murmur or rub or gallop GI: + BS, soft and nontender , nondistended Ext: No pedal  edema  Access: AVF @ LUA with BFR 400   Dialysis Orders: TTS @ AF  58.5 kg 4 hrs 2K/2.25Ca 400/A1.5 No Heparin AVF @ LUA  Hectorol 2Aranesp 80 mcg 9/29 and Venofer 50 mg on Thurs.   Assessment: 1. Left eye pain - pain controlled with  Dilaudid, swelling down; MRI 10/6 suggesting optic perineuritis, per Ophthalmology and ID; antibiotics dc'd, on IV steroids.  2. ESRD on HD  3. HTN/volume - hydralazine dose dropped to 50 bid for lower standing BP's in 130's with dizziness on standing (was also on Irbesartan 150 mg qd as outpatient if more BP medication is needed); below edw.  4. Anemia - Hgb 11.7, Aranesp on hold, continue weekly Fe.  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 Nutrition - Alb 3.2, renal diet, multivitamin. Add breeze supplement 7. DM 2 8. Hepatitis C  9. Seizure disorder - on Keppra.  10. Cocaine abuse - admits smoking crack 3 days prior to admission.AND He admits INTRANASAL Cocaine use and this  could be some etiology of his optic perineruritis 11. Dispo - may be difficult situation, lives alone and says he doesn't have much support  Plan - HD today, UF 1-2 kg.   Kelly Splinter MD pager [phone]    cell [phone] 01/26/2014, 9:43 AM    Labs: Basic Metabolic Panel:  Recent Labs Lab 01/22/14 0500 01/24/14 0700 01/26/14 0750  NA  131* 134* 132*  K 4.1 4.0 3.9  CL 89* 94* 92*  CO2 25 25 25   GLUCOSE 93 111* 95  BUN 44* 38* 47*  CREATININE 10.29* 8.74* 8.12*  CALCIUM 8.4 8.9 9.2  PHOS 6.5* 4.7* 4.0   Liver Function Tests:  Recent Labs Lab 01/20/14 01/20/14 0503 01/22/14 0500 01/24/14 0700 01/26/14 0750  AST 21 19  --   --   --   ALT 13 11  --   --   --   ALKPHOS 65 63  --   --   --   BILITOT 0.3 0.2*  --   --   --   PROT 8.4* 7.9  --   --   --   ALBUMIN 3.3* 3.3* 3.4* 3.2* 3.3*   No results found for this basename: LIPASE, AMYLASE,  in the last 168 hours No results found for this basename: AMMONIA,  in the last 168 hours CBC:  Recent Labs Lab 01/20/14 01/20/14 0503 01/22/14 0500 01/24/14 0700 01/26/14 0750  WBC 3.4* 4.5 3.9* 4.9 5.0  HGB 12.9* 12.4* 12.6* 11.7* 11.9*  HCT 39.3 38.2* 37.9* 35.9* 36.1*  MCV 86.6 87.2 86.7 86.5 87.6  PLT 218 208  204 177 162   Cardiac Enzymes:  Recent Labs Lab 01/19/14 1310 01/19/14 1937 01/20/14  TROPONINI <0.30 <0.30 <0.30   CBG:  Recent Labs Lab 01/20/14 0819 01/21/14 0752 01/23/14 0745 01/24/14 1153 01/25/14 0733  GLUCAP 101* 124* 108* 138* 113*    Studies/Results: No results found. Medications:   . calcium acetate  1,334 mg Oral TID WC  . doxercalciferol      . doxercalciferol  2 mcg Intravenous Q T,Th,Sa-HD  . feeding supplement (RESOURCE BREEZE)  1 Container Oral TID BM  . ferric gluconate (FERRLECIT/NULECIT) IV  62.5 mg Intravenous Q Thu-HD  . heparin  5,000 Units Subcutaneous 3 times per day  . hydrALAZINE  50 mg Oral BID  . hydrALAZINE  50 mg Oral Once  . HYDROcodone-acetaminophen      . HYDROmorphone      . levETIRAcetam  1,000 mg Oral QHS  . multivitamin  1 tablet Oral QHS  . pantoprazole  40 mg Oral Daily  . sodium chloride  3 mL Intravenous Q12H  . sodium chloride  3 mL Intravenous Q12H

## 2014-01-26 NOTE — Progress Notes (Signed)
Patient ID: [name], male   DOB: Jul 19, 1955, 58 y.o.   MRN: [id number] Medicine attending:  I personally examined this patient this morning together with resident physician Dr. Duwaine Maxin and I concur with her evaluation and management plan. 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 He continues to have local inflammation and pain and inability to adduct the left eye medially. He was started on parenteral steroids. He'll be transitioned to oral steroids but requires ongoing hospitalization until we see some clinical improvement. He has end-stage renal disease on hemodialysis.  Murriel Hopper, MD, [place]  Hematology-Oncology/Internal Medicine

## 2014-01-27 LAB — GLUCOSE, CAPILLARY: Glucose-Capillary: 123 mg/dL — ABNORMAL HIGH (ref 70–99)

## 2014-01-27 LAB — CBC
HEMATOCRIT: 36.3 % — AB (ref 39.0–52.0)
HEMOGLOBIN: 12.1 g/dL — AB (ref 13.0–17.0)
MCH: 29.2 pg (ref 26.0–34.0)
MCHC: 33.3 g/dL (ref 30.0–36.0)
MCV: 87.5 fL (ref 78.0–100.0)
Platelets: 161 10*3/uL (ref 150–400)
RBC: 4.15 MIL/uL — AB (ref 4.22–5.81)
RDW: 15.5 % (ref 11.5–15.5)
WBC: 4.3 10*3/uL (ref 4.0–10.5)

## 2014-01-27 MED ORDER — DOCUSATE SODIUM 50 MG PO CAPS
50.0000 mg | ORAL_CAPSULE | Freq: Every day | ORAL | Status: DC
  Administered 2014-01-27 – 2014-01-29 (×3): 50 mg via ORAL
  Filled 2014-01-27 (×4): qty 1

## 2014-01-27 MED ORDER — POLYETHYLENE GLYCOL 3350 17 G PO PACK
17.0000 g | PACK | Freq: Every day | ORAL | Status: DC
  Administered 2014-01-27 – 2014-01-29 (×3): 17 g via ORAL
  Filled 2014-01-27 (×3): qty 1

## 2014-01-27 NOTE — Progress Notes (Signed)
  I have seen and examined the patient, and reviewed the daily progress note by Josiah Lobo, MS4 and discussed the care of the patient with them. Please see my progress note from 01/27/2014 for further details regarding assessment and plan.    Signed:  Francesca Oman, DO 01/27/2014, 8:11 PM

## 2014-01-27 NOTE — Discharge Summary (Signed)
Name: [name] MRN: [id number] DOB: [birth year] 58 y.o. PCP: Windy Kalata, MD  Date of Admission: 01/19/2014  7:10 AM Date of Discharge: 01/29/2014 Attending Physician: Michel Bickers, MD  Discharge Diagnosis: Principal Problem:   Optic perineuritis, left Active Problems:   Hypertension   Diabetes mellitus without complication   ESRD on hemodialysis   Seizure disorder   Normocytic anemia   Cocaine abuse   Tobacco abuse   Protein-calorie malnutrition, severe   Orthostatic hypotension  Discharge Medications:   Medication List    STOP taking these medications       hydrALAZINE 100 MG tablet  Commonly known as:  APRESOLINE      TAKE these medications       calcium acetate 667 MG capsule  Commonly known as:  PHOSLO  Take 667-1,334 mg by mouth See admin instructions. Take 1334 mg by mouth three times a day with meals and take 667mg  a day with snacks     feeding supplement (RESOURCE BREEZE) Liqd  Take 1 Container by mouth 3 (three) times daily between meals.     HYDROcodone-acetaminophen 5-325 MG per tablet  Commonly known as:  NORCO/VICODIN  Take 1-2 tablets by mouth every 6 (six) hours as needed for moderate pain.     levETIRAcetam 1000 MG tablet  Commonly known as:  KEPPRA  Take 1,000 mg by mouth daily.     predniSONE 20 MG tablet  Commonly known as:  DELTASONE  Take 3 tablets (60 mg total) by mouth daily with breakfast.        Disposition and follow-up:   [name] was discharged from [facility] in stable condition.  At the hospital follow up visit please address:  1.  How well have his blood pressures been controlled off hydralazine?  Is he still experiencing orthostatic hypotension?  2.  How well have his glucose levels been since he has been on daily steroids?  3.  Status of his left eye.  2.  Labs / imaging needed at time of follow-up: Glucose check.   3.  Pending labs/ test needing follow-up: None.    Follow-up Appointments:  The patient will need transportation from nursing facility to the following appointments.  1. Dr. Posey Pronto on 10/16 at 11.30AM 2. Dentist on 10/19   Discharge Instructions: [name], you were admitted to the hospital because of your L eye pain and swelling. We believe it is due to optic perineuritis, which means inflammation surrounding the optic nerve, the nerve responsible for your vision. Dr. Posey Pronto of Pediatric Opthalmology was consulted and suggested to continue 60 mg of prednisone for now and would like to follow-up with you in her clinic.   A few recommendations: 1) CONTINUE 60 mg prednisone 2) You may continue to wear the eye patch to help with preventing yourself from seeing double vision.  2) CONTINUE taking 50 mg of hydralazine, your medication to help your lower your blood pressure.  3) AVOID use of cocaine or other drugs   Consultations: Nephrology, Ophthalmology  Procedures Performed:  Ct Head Wo Contrast  01/19/2014   CLINICAL DATA:  Acute headache and the left sided blurry vision. Lethargy. Initial encounter.  EXAM: CT HEAD WITHOUT CONTRAST  TECHNIQUE: Contiguous axial images were obtained from the base of the skull through the vertex without intravenous contrast.  COMPARISON:  None.  FINDINGS: Scattered periventricular hypodensities, most conspicuous about the anterior horn of the right lateral ventricle (image 19, series 201), compatible with microvascular  ischemic disease. Gray-white differentiation is otherwise well maintained without CT evidence of acute large territory infarct. No intraparenchymal or extra-axial mass or hemorrhage. Normal size a configuration of the ventricles and basilar cisterns. No midline shift. Limited visualization of the paranasal sinuses and mastoid air cells is normal. Intracranial atherosclerosis. Regional soft tissues are normal. No displaced calvarial fracture.  IMPRESSION: Mild microvascular ischemic disease without acute  intracranial process.   Electronically Signed   By: Sandi Mariscal M.D.   On: 01/19/2014 10:28   [name] Head Wo Contrast  01/21/2014   CLINICAL DATA:  Diabetic with LEFT proptosis.  EXAM: MRA HEAD WITHOUT CONTRAST  TECHNIQUE: Angiographic images of the Circle of Willis were obtained using MRA technique without intravenous contrast.  COMPARISON:  CT head 01/19/2014. CT maxillofacial 01/20/2014. MR of the head and orbits reported separately.  FINDINGS: The LEFT internal carotid artery is widely patent.  The RIGHT internal carotid artery demonstrates long segment 50% stenosis in its inferior cavernous segment. There is a similar 50% stenosis in the cavernous/supraclinoid junction. Both ICA termini are widely patent.  Basilar artery widely patent with vertebrals both contributing, RIGHT dominant. No proximal stenosis of the RIGHT or LEFT middle cerebral artery. No MCA branch occlusion.  Both posterior cerebral arteries widely patent in their proximal segments. Some mild irregularity in the LEFT P3 PCA segment distal to the ambient cistern.  Moderate irregularity of the RIGHT A1 ACA. The LEFT A1 ACA is robust and supplies both anterior cerebrals as an azygos A2.  IMPRESSION: Non stenotic atheromatous change of the skull base and cavernous RIGHT internal carotid artery, likely manifestation of diabetic type atherosclerotic disease. Similar moderate irregularity intracranially affecting the proximal RIGHT anterior cerebral.  No flow reducing lesion of the carotid or basilar arteries is identified.   Electronically Signed   By: Rolla Flatten M.D.   On: 01/21/2014 15:41   Mr Brain Wo Contrast  01/21/2014   CLINICAL DATA:  Left eye pain and blurred vision for 3 days. Left eye drainage. Left-sided frontal headache. Diabetes and dialysis.  EXAM: MRI HEAD AND ORBITS WITHOUT CONTRAST  TECHNIQUE: Multiplanar, multiecho pulse sequences of the brain and surrounding structures were obtained without intravenous contrast.  Multiplanar, multiecho pulse sequences of the orbits and surrounding structures were obtained including fat saturation techniques, without intravenous contrast administration.  : COMPARISON:  CT head 01/19/2014  FINDINGS: MRI HEAD FINDINGS  Mild atrophy. Moderate chronic ischemic changes throughout the cerebral white matter. 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 Extensive chronic ischemia in the pons. Cerebellum intact.  Negative for acute infarct.  Chronic hemorrhage in the right frontal lobe along the surface of the brain possibly due to prior subarachnoid hemorrhage or trauma. No mass or edema identified.  MRI ORBITS FINDINGS  Mild left-sided proptosis. The globe is normal in shape and size and is symmetric. Extraocular muscles are normal. No edema in the orbital fat.  The optic nerve sheath is mildly dilated on the left. Question papilledema. Optic nerve is normal in signal bilaterally. Lacrimal gland is normal. No mass is present within the orbit. No edema in the eyelid to suggest orbital cellulitis. Negative for abscess. Paranasal sinuses reveal minimal mucosal edema in the base of left maxillary sinus.  IMPRESSION: Atrophy and moderately severe chronic microvascular ischemia. No acute infarct.  Chronic hemorrhage right frontal lobe, question prior trauma  Mild left-sided proptosis without mass lesion. Mildly distended optic nerve sheath on the left, question papilledema. No underlying mass edema  or abscess identified   Electronically Signed   By: Franchot Gallo M.D.   On: 01/21/2014 15:51   Ct Maxillofacial W/cm  01/20/2014   CLINICAL DATA:  New onset of left orbital swelling 4 days ago without reported trauma ; dialysis dependent renal failure ; subsequent encounter  EXAM: CT MAXILLOFACIAL WITH CONTRAST  TECHNIQUE: Multidetector CT imaging of the maxillofacial structures was performed with intravenous contrast. Multiplanar CT image reconstructions were also generated. A small  metallic BB was placed on the right temple in order to reliably differentiate right from left.  CONTRAST:  53mL OMNIPAQUE IOHEXOL 300 MG/ML  SOLN intravenous  COMPARISON:  Noncontrast CT scan of the brain of January 19, 2014  FINDINGS: The globes are intact. There is no significant pre or postseptal edema. The periorbital soft tissues exhibit no evidence of abscess nor significant edema. The male are soft tissues are unremarkable.  The paranasal sinuses exhibit no air-fluid levels. There is a small amount of mucoperiosteal thickening in the left maxillary sinus. There is lucency within the posterior aspect of the left maxilla with small amount of increased density in the adjacent soft tissues along the lateral aspect of the alveolar ridge. A tiny focus of gas is demonstrated on image 42. The posterior-most molar appears to be partially destroyed. The temporomandibular joints exhibit no acute abnormality.  IMPRESSION: 1. There are no findings to suggest significant inflammatory changes of the orbit. The pre and postseptal soft tissues are fairly symmetric with those on the right. 2. There is abnormal soft tissue density along the external surface of the left maxilla posteriorly with destructive changes of a posterior molar. The findings suggest periodontal inflammation without discrete abscess. 3. No significant inflammatory change within the paranasal sinuses is demonstrated.   Electronically Signed   By: David  Martinique   On: 01/20/2014 08:49   Dg Chest Portable 1 View  01/19/2014   CLINICAL DATA:  Dyspnea for 2 days. History of hypertension, diabetes and smoking. Initial encounter.  EXAM: PORTABLE CHEST - 1 VIEW  COMPARISON:  01/02/2013; 12/19/2012; 05/06/2012; 03/02/2012  FINDINGS: Grossly unchanged enlarged cardiac silhouette. Overall improved aeration of the lungs with minimal residual right mid lung heterogeneous opacities, stable since the 02/2012 examination and favored to represent atelectasis or scar. No  new focal airspace opacities. No pleural effusion pneumothorax. No evidence of edema. Postsurgical change of the right glenohumeral joint.  IMPRESSION: 1.  No acute cardiopulmonary disease. 2. Improved aerated of lungs with persistent right mid lung atelectasis / scar.   Electronically Signed   By: Sandi Mariscal M.D.   On: 01/19/2014 08:12   Mr Farley Ly Cm  01/21/2014   CLINICAL DATA:  Left eye pain and blurred vision for 3 days. Left eye drainage. Left-sided frontal headache. Diabetes and dialysis.  EXAM: MRI HEAD AND ORBITS WITHOUT CONTRAST  TECHNIQUE: Multiplanar, multiecho pulse sequences of the brain and surrounding structures were obtained without intravenous contrast. Multiplanar, multiecho pulse sequences of the orbits and surrounding structures were obtained including fat saturation techniques, without intravenous contrast administration.  : COMPARISON:  CT head 01/19/2014  FINDINGS: MRI HEAD FINDINGS  Mild atrophy. Moderate chronic ischemic changes throughout the cerebral white matter. 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 Extensive chronic ischemia in the pons. Cerebellum intact.  Negative for acute infarct.  Chronic hemorrhage in the right frontal lobe along the surface of the brain possibly due to prior subarachnoid hemorrhage or trauma. No mass or edema identified.  MRI ORBITS  FINDINGS  Mild left-sided proptosis. The globe is normal in shape and size and is symmetric. Extraocular muscles are normal. No edema in the orbital fat.  The optic nerve sheath is mildly dilated on the left. Question papilledema. Optic nerve is normal in signal bilaterally. Lacrimal gland is normal. No mass is present within the orbit. No edema in the eyelid to suggest orbital cellulitis. Negative for abscess. Paranasal sinuses reveal minimal mucosal edema in the base of left maxillary sinus.  IMPRESSION: Atrophy and moderately severe chronic microvascular ischemia. No acute infarct.  Chronic hemorrhage  right frontal lobe, question prior trauma  Mild left-sided proptosis without mass lesion. Mildly distended optic nerve sheath on the left, question papilledema. No underlying mass edema or abscess identified   Electronically Signed   By: Franchot Gallo M.D.   On: 01/21/2014 15:51   Hospital Course by problem list:   Left optic perineuritis: Patient's initial presentation was three-day history of sudden onset of progressive L eye pain, swelling, as well opthalmoloplegia and double vision.  His pupils were not reactive to light bilaterally and extraocular movements were limited on elevation, depression, abduction, and abduction on the left. Initial CT did not reveal any significant processes. Dr. Posey Pronto of Pediatric Opthalmology was consulted and believed initial presentation was dactyoadenitis with component of orbital/preseptal cellulitis and started patient on vancomycin and ceftriaxone. CT maxillary noted a soft density at L maxilla eye with destructive changes of posterior molar, suggesting peridontal inflammation and with a further discussion with radiologist on 10/5, there are likely other abnormalities in his oral cavity including dental caries in his R molars and loose dental roots. Upon reevaluation, MRI MRA of head and orbit was pursued to evaluate for cavernous sinus thrombosis. Imaging was negative for any evidence of thrombosis but did show mild distention of L optic nerve sheath, and Dr. Posey Pronto believed his presentation was consistent with optic perineuritis, a rare variant of orbital pseudotumor. ACE, RPR, ANCA antibodies were all negative. Antibiotics were discontinued and patient was started on 60 mg IV solumedrol for 4 days and then transitioned to 60 mg PO prednisone. Upon starting on solumedrol, patient reported less eye discomfort although his opthalmoloplegia and double vision still persisted. He was given an eye patch to cover his affected eye to help with double vision. He was given  Norco/Vicodin and dilaudid every six hours for pain as needed and was weaned off the dilaudid by discharge. A case report was on recurrent optic perineuritis after intranasal cocaine abuse (Coppens et al. Neuro-Ophthalmology 30(2): [phone]) though patient reports smoking cocaine and very occasional use of it intranasally. Given patient's poor dental health, dental infection may have been a source. Patient will continue 60 mg PO prednisone on discharge and will follow up with Dr. Posey Pronto (ophthalmology) to decide on further management and tapering. He will see a dentist on 10/19.   New t-wave inversions inferior-lateral leads: This was a new finding compared to patient's prior EKGs. Troponins were negative x 3 and patient denied chest pain making ACS less likely. Telemetry revealed normal sinus rhythm with no events. Upon speaking with cardiology, this may be related to his LVH given his ECHO findings of LVH, hypertension and lack of cardiac symptoms.   Orthostatic Hyportension:  Patient reports lightheadedness upon standing and his vitals were positive for orthostasis. We decreased his home hydralazine dosage from 100 to 50 mg bid.   Hypertension: Patient's blood pressures were elevated in the 160s-190s range and continued on home hydralazine though we  decreased the dose in light of orthostatics.  Hydralazine was eventually discontinued and patient has been normotensive.  Will continue to hold at discharge.  Please re-evaluate blood pressure at SNF and at dialysis to determine if he needs antihypertensive therapy resumed.   Diabetes Mellitus: Patient's last Hgb A1c 5.0 July 2014. [name] not currently on treatment. His CBGs were in the 120-130s range and we did not observe drastic elevation after starting patient on steroids.   ESRD on hemodialysis, TThS schedule: Patient received inpatient dialysis and was seen by Dr. Posey Pronto and Dr. Tessa Lerner.   Cocaine abuse: Patient has a long history of cocaine abuse with  recent use being about three days ago before admission. We encouraged cessation of cocaine use.    Tobacco abuse: Patient was counseled on cessation and did not receive nicotine patch during hospitalization.   Seizure disorder: Patient was continued on home Keppra and did not have any seizure activity during hospitalization.   Diet: Patient was placed on renal diet with 1200 mL fluid restriction.  Discharge Vitals:   BP 156/80  Pulse 72  Temp(Src) 98.2 F (36.8 C) (Oral)  Resp 12  Ht 5\' 9"  (1.753 m)  Wt 59.9 kg (132 lb 0.9 oz)  BMI 19.49 kg/m2  SpO2 100%   Discharge Labs:  Results for orders placed during the hospital encounter of 01/19/14 (from the past 24 hour(s))  GLUCOSE, CAPILLARY     Status: Abnormal   Collection Time    01/26/14  1:04 PM      Result Value Ref Range   Glucose-Capillary 121 (*) 70 - 99 mg/dL  CBC     Status: Abnormal   Collection Time    01/27/14  4:52 AM      Result Value Ref Range   WBC 4.3  4.0 - 10.5 K/uL   RBC 4.15 (*) 4.22 - 5.81 MIL/uL   Hemoglobin 12.1 (*) 13.0 - 17.0 g/dL   HCT 36.3 (*) 39.0 - 52.0 %   MCV 87.5  78.0 - 100.0 fL   MCH 29.2  26.0 - 34.0 pg   MCHC 33.3  30.0 - 36.0 g/dL   RDW 15.5  11.5 - 15.5 %   Platelets 161  150 - 400 K/uL  GLUCOSE, CAPILLARY     Status: Abnormal   Collection Time    01/27/14  7:53 AM      Result Value Ref Range   Glucose-Capillary 123 (*) 70 - 99 mg/dL    Signed: Josiah Lobo, Med Student 01/29/2014, 10:03 AM   Duwaine Maxin, DO IMTS, PGY2  01/29/2014 Services Ordered on Discharge: None Equipment Ordered on Discharge: Eye patch and walker for assistance

## 2014-01-27 NOTE — Progress Notes (Signed)
Subjective: Mr. Soulliere was seen in his room today. He reports that he eye feels less sore but the pain is not treated as well as before since he is receiving his Dilaudid at a lower frequency than before. He still complains of inability to open his [name] and binocular diplopia. He feels less dizzy when he stands up compared to before and was able to walk down the hallway yesterday and felt dizzy at the end. He used an eye patch yesterday and felt that he needs something inside it to absorb his tears but did help with his diplopia. He has not had a bowel movement since Friday. He said he will be seeing a dentist on 10/19.   Objective: Vital signs in last 24 hours: Filed Vitals:   01/26/14 1200 01/26/14 1204 01/26/14 2108 01/27/14 0754  BP: 168/69 165/74 169/91 164/73  Pulse: 68 64 80 71  Temp:  98.2 F (36.8 C) 98.1 F (36.7 C) 98 F (36.7 C)  TempSrc:  Oral Oral Oral  Resp: 15 14 16 16   Height:   5\' 9"  (1.753 m)   Weight:  55.8 kg (123 lb 0.3 oz) 56.654 kg (124 lb 14.4 oz)   SpO2:  98% 97% 94%   Weight change: 0.093 kg (3.3 oz)  Intake/Output Summary (Last 24 hours) at 01/27/14 0837 Last data filed at 01/27/14 0700  Gross per 24 hour  Intake   1070 ml  Output   1996 ml  Net   -926 ml   General: resting in bed in NAD  HEENT: less periorbital swelling on L compared to R from initial presentation; complete ptosis of [name]; binocular diplopia, EOM in L eye continue to be limited and L abduction may be more grossly intact L and R eye pupils continue to be nonreactive to light and dilated likely from dilating agent from optho's exam on 10/3.   Cardiac: RRR, no rubs, murmurs or gallops  Pulm: clear to auscultation bilaterally, moving normal volumes of air  Abd: soft, nontender, nondistended, BS present  Ext: warm and well perfused, no pedal edema.  Neuro: alert and oriented X3, responding appropriately, following commands. Gait not assessed today.    Lab Results: Results for  MASTON, WIGHT (MRN [id number]) as of 01/25/2014 11:55  Ref. Range 01/24/2014 07:00  Sodium Latest Range: 136-145 mEq/L 134 (L)  Potassium Latest Range: 3.5-5.1 mEq/L 4.0  Chloride Latest Range: 96-112 mEq/L 94 (L)  CO2 Latest Range: 19-32 mEq/L 25  BUN Latest Range: 7.0-26.0 mg/dL 38 (H)  Creatinine Latest Range: 0.50-1.35 mg/dL 8.74 (H)  Calcium Latest Range: 8.4-10.5 mg/dL 8.9  GFR calc non Af Amer Latest Range: >90 mL/min 6 (L)  GFR calc Af Amer Latest Range: >90 mL/min 7 (L)  Glucose Latest Range: 70-99 mg/dL 111 (H)  Anion gap Latest Range: 5-15  15  Phosphorus Latest Range: 2.3-4.6 mg/dL 4.7 (H)  Albumin Latest Range: 3.5-5.2 g/dL 3.2 (L)  WBC Latest Range: 4.0-10.5 K/uL 4.9  RBC Latest Range: 4.22-5.81 MIL/uL 4.15 (L)  Hemoglobin Latest Range: 13.0-17.0 g/dL 11.7 (L)  HCT Latest Range: 39.0-52.0 % 35.9 (L)  MCV Latest Range: 78.0-100.0 fL 86.5  MCH Latest Range: 26.0-34.0 pg 28.2  MCHC Latest Range: 30.0-36.0 g/dL 32.6  RDW Latest Range: 11.5-15.5 % 15.9 (H)  Platelets Latest Range: 150-400 K/uL 177   Medications:  Scheduled Meds: . calcium acetate  1,334 mg Oral TID WC  . doxercalciferol  2 mcg Intravenous Q T,Th,Sa-HD  . feeding supplement (RESOURCE BREEZE)  1 Container Oral TID BM  . ferric gluconate (FERRLECIT/NULECIT) IV  62.5 mg Intravenous Q Thu-HD  . heparin  5,000 Units Subcutaneous 3 times per day  . hydrALAZINE  50 mg Oral BID  . levETIRAcetam  1,000 mg Oral QHS  . multivitamin  1 tablet Oral QHS  . pantoprazole  40 mg Oral Daily  . predniSONE  60 mg Oral Q breakfast  . sodium chloride  3 mL Intravenous Q12H  . sodium chloride  3 mL Intravenous Q12H   Continuous Infusions:  PRN Meds:.sodium chloride, acetaminophen, acetaminophen, calcium acetate, diphenhydrAMINE, HYDROcodone-acetaminophen, HYDROmorphone (DILAUDID) injection, promethazine, sodium chloride  Assessment/Plan: Principal Problem:   Optic neuritis, left Active Problems:   Hypertension    Diabetes mellitus without complication   ESRD on hemodialysis   Seizure disorder   Normocytic anemia   Cocaine abuse   Tobacco abuse   Protein-calorie malnutrition, severe  Optic perineuritis, left: Based on MRI/MRA of head and orbit on 10/05, no evidence of cavernous sinus thrombosis was seen and mild distention of L optic nerve sheath was noted. According to Dr. Posey Pronto, the significant inflammation of the perineural sheath of the L eye is likely consistent with optic perineuritis, a rare variant of orbital pseudotumor. ACE level is normal, RPR is NR, ANCA antibodies negative. Dr. Tessa Lerner had mentioned a case report of recurrent optic perineuritis after intranasal cocaine abuse (Coppens et al. Neuro-Ophthalmology 30(2): [phone]) though patient reports smoking cocaine and very occasional use of it intranasally. Patient's MRI had noted minimal edema in the base of L maxillary sinus and CT maxillary from 10/4 noted a soft density at L maxilla eye with destructive changes of posterior molar, suggesting peridontal inflammation and with a further discussion with radiologist on 10/5, there are likely other abnormalities in his oral cavity including dental caries in his R molars and loose dental roots and given this, one can consider this serving as nidus of infection for his presentation.  - continue to appreciate ophthalmology recommendations  - transitioned to 60 mg PO prednisone on 10/10, patient tolerating well; talked to Dr. Posey Pronto who would like to discharge on 60 mg PO and see patient this week for follow-up - provided eye patch to help with patient's vision as patient reports binocular and has nearly complete ptosis of his [name]  - discontinued vancomycin and ceftriaxone 10/6   - continue Norco/Vicodin 5-325 mg q4h PRN pain  - continue 0.5 Dilaudid IV q8h PRN pain and wean off to q12h   Hypertension: I personally performed orthostatic vitals, 10/08. Supine BP was 185/80 to sitting was 165/85 and to  standing was 130/70. Patient reported feeling dizzy upon standing and meets orthostatic hypotension criteria. Based on the review of his medications, hydralazine is a potential culprit given its vasodilatory effects. After decreasing the dose, patient reports less dizziness upon standing.  --decreased hydralazine dose from 100 mg to 50 mg BID  --continue orthostatic vitals  --may consider switching to another antihypertensive if patient continues to have orthostasis  Diabetes Mellitus: Hgb A1c 5.0 July 2014. Serum BG 165. [name] not currently on treatment.  - may need to start SSI given initiation of steroids  - continue to monitor CBGs daily   ESRD on hemodialysis, TThS schedule: received HD yesterday, 10/10 - HD per nephrology   Cocaine abuse: patient has a long history of cocaine abuse with recent use being about three days ago before presentation.  - counseled on cessation   Seizure disorder:  - continue home  Keppra   VTE ppx: heparin   Diet: renal diet with 1200 mL fluid restriction   This is a Careers information officer Note.  The care of the patient was discussed with Dr. Megan Salon and Dr. Beryle Beams and the assessment and plan formulated with their assistance. Please see their attached note for official documentation of the daily encounter.   LOS: 8 days   Josiah Lobo, Med Student 01/27/2014, 8:37 AM

## 2014-01-27 NOTE — Progress Notes (Signed)
Subjective: [name] was seen and examined this morning.  His eye pain is around 6/10 but he is okay with trying to wean down the IV dilaudid and use only oral pain medication.  He was able to walk with PT yesterday and thru brought him a cane.  [name] agreeable to outpatient PT when he leaves but does not want home PT.  His appetite is good.  Objective: Vital signs in last 24 hours: Filed Vitals:   01/26/14 1130 01/26/14 1200 01/26/14 1204 01/26/14 2108  BP: 132/63 168/69 165/74 169/91  Pulse: 67 68 64 80  Temp:   98.2 F (36.8 C) 98.1 F (36.7 C)  TempSrc:   Oral Oral  Resp: 13 15 14 16   Height:    5\' 9"  (1.753 m)  Weight:   55.8 kg (123 lb 0.3 oz) 56.654 kg (124 lb 14.4 oz)  SpO2:   98% 97%   Weight change: 0.093 kg (3.3 oz)  Intake/Output Summary (Last 24 hours) at 01/27/14 0743 Last data filed at 01/26/14 2300  Gross per 24 hour  Intake    830 ml  Output   1996 ml  Net  -1166 ml   Vitals reviewed. General: resting in bed in NAD HEENT: L 58mm, R 66mm, both NR, limited left eye adduction, left eye appears down and out, swelling has improved, no temporal tenderness Cardiac: RRR, no rubs, murmurs or gallops Pulm: clear to auscultation bilaterally, no wheezes, rales, or rhonchi Abd: soft, nontender, nondistended, BS present Ext: warm and well perfused, no pedal edema Neuro: alert and oriented X3, follows commands appropriately  Lab Results: CBG:  Recent Labs Lab 01/21/14 0752 01/23/14 0745 01/24/14 1153 01/25/14 0733 01/26/14 1304 01/27/14 0753  GLUCAP 124* 108* 138* 113* 121* 123*    Medications: I have reviewed the patient's current medications. Scheduled Meds: . calcium acetate  1,334 mg Oral TID WC  . doxercalciferol  2 mcg Intravenous Q T,Th,Sa-HD  . feeding supplement (RESOURCE BREEZE)  1 Container Oral TID BM  . ferric gluconate (FERRLECIT/NULECIT) IV  62.5 mg Intravenous Q Thu-HD  . heparin  5,000 Units Subcutaneous 3 times per day  . hydrALAZINE  50  mg Oral BID  . levETIRAcetam  1,000 mg Oral QHS  . multivitamin  1 tablet Oral QHS  . pantoprazole  40 mg Oral Daily  . predniSONE  60 mg Oral Q breakfast  . sodium chloride  3 mL Intravenous Q12H  . sodium chloride  3 mL Intravenous Q12H   Continuous Infusions:  PRN Meds:.sodium chloride, acetaminophen, acetaminophen, calcium acetate, diphenhydrAMINE, HYDROcodone-acetaminophen, HYDROmorphone (DILAUDID) injection, promethazine, sodium chloride  Assessment/Plan: 58 year old man with PMH of DM2, ESRD on HD, HTN, cocaine abuse, seizure disorder, presenting with painful ophthalmoplegia of the left eye.  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   He has had reduction in pain in recent days with steroid and pain meds.  I expect his pain should improve further with improvement in inflammation of the optic nerve sheath.  He can likely be discharge home but I will see how he tolerates physical therapy today.  I want to be sure he will be safe at home with his visual limitations.   - continue po Prednisone 60mg  daily - continue Vicodin PRN; decrease IV dilaudid with hopes of transition to po only - appreciate ophthalmology recommendations; will plan to have him follow-up with Dr. Posey Pronto in her office on 10/14.  ESRD: On HD on T/Th/Sat. -  HD per nephrology   Orthostatic hypotension: [name] still lightheaded but was able to walk with PT.  [name] below EDW which is also likely contributing but pressures are still elevated.  - continue hydralazine at lower dose - ambulate with PT - I will see how he performs today and he can potentially be discharge home with close follow-up. - HD per nephrololgy  HTN:  Hydralazine 50mg  BID.  Nephrology note reports he is also on Irbesartan 150mg  as an outpatient.  There appears to have been a discrepancy and the patient reports he is not on irbesartan.   - continue hydralazine, we may be able to reduce further if we target treatment to  standing BP - target meds to reasonable standing BP  DM2: Diet controlled. CBGs low 100s.  Seizure disorder: Stable. Continue home [place].   Tobacco use: Counseled on cessation.  Declined nicotine patch.   VTE prophylaxis: Heparin TID   FEN:  NSL Renal panel in am Renal diet   Dispo: Disposition is deferred at this time, awaiting improvement of current medical problems.  Anticipated discharge in approximately 1-2 day(s).    The patient does not have a current PCP and does need an [facility] hospital follow-up appointment after discharge.  The patient does not have transportation limitations that hinder transportation to clinic appointments.  .Services Needed at time of discharge: Y = Yes, Blank = No PT:  Y  OT:   RN:   Equipment:  rolling walker with 5 inch wheels  Other:     LOS: 8 days   [name] Oman, DO 01/27/2014, 7:43 AM

## 2014-01-27 NOTE — Progress Notes (Signed)
Subjective:  Tolerated HD yesterday/ "still can not open my eye to see"  Objective Vital signs in last 24 hours: Filed Vitals:   01/26/14 1200 01/26/14 1204 01/26/14 2108 01/27/14 0754  BP: 168/69 165/74 169/91 164/73  Pulse: 68 64 80 71  Temp:  98.2 F (36.8 C) 98.1 F (36.7 C) 98 F (36.7 C)  TempSrc:  Oral Oral Oral  Resp: 15 14 16 16   Height:   5\' 9"  (1.753 m)   Weight:  55.8 kg (123 lb 0.3 oz) 56.654 kg (124 lb 14.4 oz)   SpO2:  98% 97% 94%   Weight change: 0.093 kg (3.3 oz) Physical Exam:  Gen:  Alert,NAD, L eye  unable to open  Resp: CTA bilat  Cardio: RRR no murmur or rub or gallop  GI: + BS, soft and nontender , nondistended  Ext: No pedal edema / chronic dry scaling skin changes Access: AVF @ LUA pos bruit   Dialysis Orders: TTS @ AF  58.5 kg 4 hrs 2K/2.25Ca 400/A1.5 No Heparin AVF @ LUA  Hectorol 2Aranesp 80 mcg 9/29 and Venofer 50 mg on Thurs.   Assessment:  1. Left eye pain - pain controlled with Dilaudid, swelling down; MRI 10/6 suggesting optic perineuritis, per Ophthalmology and ID; antibiotics dc'd, on IV steroids.  2. ESRD on HD  On schedule TTS 3. HTN/volume - hydralazine dose dropped to 50 bid for lower standing BP's in 130's with dizziness on standing (was also on Irbesartan 150 mg qd as outpatient if more BP medication is needed); Now below edw.  4. Anemia - Hgb 12.1 Aranesp on hold, continue weekly Fe.  5. Sec HPT - Ca 9.2 (9.7 corrected), P 4.0 iPTH 221; Hectorol 2 mcg, Phoslo  6. Nutrition - Alb 3.3, renal diet, multivitamin. Add breeze supplement 7. DM 2 8. Hepatitis C  9. Seizure disorder - on Keppra.  10. Cocaine abuse - admits smoking crack 3 days prior to admission.AND He admits INTRANASAL Cocaine use and this could be some etiology of his optic perineruritis 11. Dispo - may be difficult situation, lives alone and says he doesn't have much support  [name] Haber, PA-C [place] [phone] 01/27/2014,9:44 AM  LOS: 8  days   Pt seen, examined and agree w A/P as above.  Kelly Splinter MD pager [phone]    cell [phone] 01/27/2014, 1:56 PM    Labs: Basic Metabolic Panel:  Recent Labs Lab 01/22/14 0500 01/24/14 0700 01/26/14 0750  NA 131* 134* 132*  K 4.1 4.0 3.9  CL 89* 94* 92*  CO2 25 25 25   GLUCOSE 93 111* 95  BUN 44* 38* 47*  CREATININE 10.29* 8.74* 8.12*  CALCIUM 8.4 8.9 9.2  PHOS 6.5* 4.7* 4.0   Liver Function Tests:  Recent Labs Lab 01/22/14 0500 01/24/14 0700 01/26/14 0750  ALBUMIN 3.4* 3.2* 3.3*    Recent Labs Lab 01/22/14 0500 01/24/14 0700 01/26/14 0750 01/27/14 0452  WBC 3.9* 4.9 5.0 4.3  HGB 12.6* 11.7* 11.9* 12.1*  HCT 37.9* 35.9* 36.1* 36.3*  MCV 86.7 86.5 87.6 87.5  PLT 204 177 162 161  CBG:  Recent Labs Lab 01/23/14 0745 01/24/14 1153 01/25/14 0733 01/26/14 1304 01/27/14 0753  GLUCAP 108* 138* 113* 121* 123*   Medications:   . calcium acetate  1,334 mg Oral TID WC  . doxercalciferol  2 mcg Intravenous Q T,Th,Sa-HD  . feeding supplement (RESOURCE BREEZE)  1 Container Oral TID BM  . ferric gluconate (FERRLECIT/NULECIT) IV  62.5 mg  Intravenous Q Thu-HD  . heparin  5,000 Units Subcutaneous 3 times per day  . hydrALAZINE  50 mg Oral BID  . levETIRAcetam  1,000 mg Oral QHS  . multivitamin  1 tablet Oral QHS  . pantoprazole  40 mg Oral Daily  . predniSONE  60 mg Oral Q breakfast  . sodium chloride  3 mL Intravenous Q12H  . sodium chloride  3 mL Intravenous Q12H

## 2014-01-28 DIAGNOSIS — I10 Essential (primary) hypertension: Secondary | ICD-10-CM

## 2014-01-28 LAB — GLUCOSE, CAPILLARY
Glucose-Capillary: 109 mg/dL — ABNORMAL HIGH (ref 70–99)
Glucose-Capillary: 267 mg/dL — ABNORMAL HIGH (ref 70–99)

## 2014-01-28 MED ORDER — LIDOCAINE HCL (PF) 1 % IJ SOLN: 5.0000 mL | INTRAMUSCULAR | Status: DC | PRN

## 2014-01-28 MED ORDER — SODIUM CHLORIDE 0.9 % IV SOLN: 100.0000 mL | INTRAVENOUS | Status: DC | PRN

## 2014-01-28 MED ORDER — PENTAFLUOROPROP-TETRAFLUOROETH EX AERO: 1.0000 "application " | INHALATION_SPRAY | CUTANEOUS | Status: DC | PRN

## 2014-01-28 MED ORDER — HEPARIN SODIUM (PORCINE) 1000 UNIT/ML DIALYSIS
1000.0000 [IU] | INTRAMUSCULAR | Status: DC | PRN
Start: 2014-01-28 — End: 2014-01-29
  Filled 2014-01-28: qty 1

## 2014-01-28 MED ORDER — LIDOCAINE-PRILOCAINE 2.5-2.5 % EX CREA: 1.0000 "application " | TOPICAL_CREAM | CUTANEOUS | Status: DC | PRN

## 2014-01-28 MED ORDER — NEPRO/CARBSTEADY PO LIQD: 237.0000 mL | ORAL | Status: DC | PRN

## 2014-01-28 MED ORDER — ALTEPLASE 2 MG IJ SOLR
2.0000 mg | Freq: Once | INTRAMUSCULAR | Status: AC | PRN
  Filled 2014-01-28: qty 2

## 2014-01-28 NOTE — Clinical Social Work Psychosocial (Signed)
Clinical Social Work Department BRIEF PSYCHOSOCIAL ASSESSMENT 01/28/2014  Patient:  [name],[name]     Account Number:  [id number]     Admit date:  01/19/2014  Clinical Social Worker:  Frederico Hamman  Date/Time:  01/28/2014 11:50 AM  Referred by:  Physician  Date Referred:  01/28/2014 Referred for  Other - See comment   Other Referral:   Patient lives with someone, but wants other housing options.   Interview type:   Other interview type:    PSYCHOSOCIAL DATA Living Status:  FRIEND(S) Admitted from facility:   Level of care:   Primary support name:   Primary support relationship to patient:   Degree of support available:   Patient lives with a friend, her daughter and the daughter's baby. This is an uncomfortable situation per patient. [name] reported that he has no family in [place].    CURRENT CONCERNS Current Concerns  Other - See comment   Other Concerns:   Housing    SOCIAL WORK ASSESSMENT / PLAN CSW received call from medical student [place] regarding patient needing assistance with housing options. CSW visited with patient to talk with him about his needs. [name] explained that he moved here from [place], [place] approx.. 7 months ago with a friend of his and they stayed with a friend. The friend he came with has since returned to [place], but he remained here. [name] currently living with a male friend and her family, and pays half the rent and helps with the light bill. This month he gave friend 400. Patient reports that he does not feel comfortable there and knows that they don't want him there so he wants to find somewhere else to stay. Patient receives SSI income (on a card) and has Medicaid and Medicare.    CSW and patient talked about whether he wants to remain in [place] or return to [place]. The need for his birth certificate for any housing. The need for a safe address to receive his mail. See "Information/Referral to [employer]  below.   Assessment/plan status:   Other assessment/ plan:   Information/referral to community resources:   Patient provided with the following information:  (1) Price of [employer] bus ticket - $16.50.  (2) Shelter information: [place] and [facility] [facility]  (3) [employer] information  (4) [place] vital records information - possibly getting BC by mail if he remains in [place].    PATIENT'S/FAMILY'S RESPONSE TO PLAN OF CARE: Patient very receptive to talking with CSW and engaged easily in conversation. Patient undecided on what he wants to do, so CSW and patient discussed several options.

## 2014-01-28 NOTE — Clinical Social Work Psychosocial (Signed)
Clinical Social Work Department BRIEF PSYCHOSOCIAL ASSESSMENT 01/28/2014  Patient:  [name],[name]     Account Number:  [id number]     Admit date:  01/19/2014  Clinical Social Worker:  Frederico Hamman  Date/Time:  01/28/2014 03:44 AM  Referred by:  Physician  Date Referred:  01/28/2014 Referred for  SNF Placement   Other Referral:   OT and PT recommended SNF after working with patient.i   Interview type:  Patient Other interview type:    PSYCHOSOCIAL DATA Living Status:  FRIEND(S) Admitted from facility:   Level of care:   Primary support name:   Primary support relationship to patient:   Degree of support available:   Patient reports that person he stays with does not want him there, although he helps with the rent and light bill.    CURRENT CONCERNS Current Concerns  Post-Acute Placement   Other Concerns:    SOCIAL WORK ASSESSMENT / PLAN CSW returned to talk with patient about discharge planning and recommendation of short-term rehab. Patient informed about where he would go for short-term rehab and the facility search process explained and a list for [facility] provided. Patient reluctantly agreed, stating that if he needs it he will go. Patient also informed CSW that a friend had called to tell him that he has been approved for an apartment, but he had no details of who called are what apartment complex. CSW provided patient with a pen and encouraged him to get the pertinent information when the friend calls again.   Assessment/plan status:   Other assessment/ plan:   Information/referral to community resources:   Skilled facility list for [facility]    PATIENT'S/FAMILY'S RESPONSE TO PLAN OF CARE: Patient in agreement and began to review the SNF list as CSW was leaving the room.

## 2014-01-28 NOTE — Progress Notes (Signed)
Physical Therapy Treatment Patient Details Name: [name] MRN: [id number] DOB: 1955-11-06 Today's Date: 01/28/2014    History of Present Illness History of Present Illness: Mr. Kerschner is a 58 year old African American male with a history of hypertension, ESRD (TTS dialysis), and prior GI bleeds who presents with L eye pain and associated discharge and swelling as well as a L-sided headache.  Pt dx with Optic perineuritis.      PT Comments    Session focused on proper use of cane with amb to decr fall risk due to decr balance; Continue to recommend rW, however pt continues to decline it  Follow Up Recommendations  Home health PT     Equipment Recommendations  Rolling walker with 5" wheels (pt refusing RW, but agreeable to cane. [name] is not preferred)    Recommendations for Other Services       Precautions / Restrictions Precautions Precautions: Fall Precaution Comments: Pt unsteady on his feet and reporting lightheadedness with gait.     Mobility  Bed Mobility Overal bed mobility: Modified Independent                Transfers Overall transfer level: Needs assistance Equipment used: Straight cane Transfers: Sit to/from Stand Sit to Stand: Supervision         General transfer comment: relies on hands for transitions.   Ambulation/Gait Ambulation/Gait assistance: Min guard (with and without physical contact) Ambulation Distance (Feet): 110 Feet Assistive device: Straight cane Gait Pattern/deviations: Narrow base of support (widened base of support with cane) Gait velocity: decreased Gait velocity interpretation: Below normal speed for age/gender General Gait Details: Coninued unsteadiness with gait, cane does help, but not as much support as RW; one loss of balance requiring min assist to regain footing   Stairs Stairs: Yes Stairs assistance: Min guard Stair Management: One rail Right;Forwards;With cane Number of Stairs: 12 General stair comments: Cues  for sequence; did pretty well with one hand on cane and teh other on the rail  Wheelchair Mobility    Modified Rankin (Stroke Patients Only)       Balance     Sitting balance-Leahy Scale: Good       Standing balance-Leahy Scale: Fair                      Cognition Arousal/Alertness: Awake/alert Behavior During Therapy: WFL for tasks assessed/performed Overall Cognitive Status: Within Functional Limits for tasks assessed                      Exercises      General Comments        Pertinent Vitals/Pain Pain Assessment: 0-10 Pain Score: 7  Pain Location: eye Pain Descriptors / Indicators: Aching Pain Intervention(s): Monitored during session    Home Living                      Prior Function            PT Goals (current goals can now be found in the care plan section) Acute Rehab PT Goals Patient Stated Goal: regain eyesight PT Goal Formulation: With patient Time For Goal Achievement: 02/03/14 Potential to Achieve Goals: Good Progress towards PT goals: Progressing toward goals    Frequency  Min 3X/week    PT Plan Current plan remains appropriate    Co-evaluation             End of Session Equipment Utilized During Treatment: Gait  belt Activity Tolerance: Patient tolerated treatment well Patient left: in chair;with call bell/phone within reach     Time: 1135-1150 PT Time Calculation (min): 15 min  Charges:  $Gait Training: 8-22 mins                    G Codes:      Roney Marion Hamff 01/28/2014, 1:07 PM  Roney Marion, [place] Pager [phone] Office [phone]

## 2014-01-28 NOTE — Progress Notes (Signed)
Subjective:  Left eye pain controlled, still unable to open lid, no further nausea or vomiting, still with lightheadedness when ambulating but has been walking around  Objective: Vital signs in last 24 hours: Temp:  [97.9 F (36.6 C)-98.1 F (36.7 C)] 97.9 F (36.6 C) (10/12 0456) Pulse Rate:  [70-75] 75 (10/12 0456) Resp:  [16] 16 (10/12 0456) BP: (127-141)/(63-64) 127/64 mmHg (10/12 0456) SpO2:  [98 %-100 %] 100 % (10/12 0456) Weight:  [58.877 kg (129 lb 12.8 oz)] 58.877 kg (129 lb 12.8 oz) (10/11 2121) Weight change: 1.177 kg (2 lb 9.5 oz)  Intake/Output from previous day: 10/11 0701 - 10/12 0700 In: 480 [P.O.:480] Out: 0  Intake/Output this shift: Total I/O In: 240 [P.O.:240] Out: 0   Lab Results:  Recent Labs  01/26/14 0750 01/27/14 0452  WBC 5.0 4.3  HGB 11.9* 12.1*  HCT 36.1* 36.3*  PLT 162 161   BMET:  Recent Labs  01/26/14 0750  NA 132*  K 3.9  CL 92*  CO2 25  GLUCOSE 95  BUN 47*  CREATININE 8.12*  CALCIUM 9.2  ALBUMIN 3.3*   Physical Exam:  General appearance: awoken from sleep then Alert,NAD, L unable to open persistent ptosis of left eye Resp: CTA bilat  Cardio: RRR no murmur or rub or gallop  GI: + BS, soft and nontender , nondistended  Extremities: No pedal edema scaly alligator like skin LE's Access: AVF @ LUA with + bruit   Medications . calcium acetate  1,334 mg Oral TID WC  . docusate sodium  50 mg Oral Daily  . doxercalciferol  2 mcg Intravenous Q T,Th,Sa-HD  . feeding supplement (RESOURCE BREEZE)  1 Container Oral TID BM  . ferric gluconate (FERRLECIT/NULECIT) IV  62.5 mg Intravenous Q Thu-HD  . heparin  5,000 Units Subcutaneous 3 times per day  . levETIRAcetam  1,000 mg Oral QHS  . multivitamin  1 tablet Oral QHS  . pantoprazole  40 mg Oral Daily  . polyethylene glycol  17 g Oral Daily  . predniSONE  60 mg Oral Q breakfast  . sodium chloride  3 mL Intravenous Q12H  . sodium chloride  3 mL Intravenous Q12H   Dialysis Orders:  TTS @ [facility]  58.5 kg 4 hrs 2K/2.25Ca 400/A1.5 No Heparin AVF @ LUA  Hectorol 2Aranesp 80 mcg 9/29 and Venofer 50 mg on Thurs.   Assessment/Plan: 1. 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 ESRD - HD on TTS @ [facility], K 3.9.  HD tomorrow. 3. HTN/volume - BP 127/64, Hydralazine on hold sec to dizziness on standing (was also on Irbesartan 150 mg qd as outpatient if more BP medication is needed); below EDW.  4. Anemia - Hgb 12.1, Aranesp on hold, continue weekly Fe.  5. Sec HPT - Ca 9.2 (9.8 corrected), P 4, iPTH 221; Hectorol 2 mcg, Phoslo 2 with meals. 6. Nutrition - Alb 3.3, renal diet, multivitamin, supplement. 7. DM 2 - no meds. 8. Hepatitis C  9. Seizure disorder - on Keppra.  10. Cocaine abuse - admits smoking crack 3 days prior to admission, denies any intranasal use.  78. Dispo - may be difficult situation, lives alone and says he doesn't have much support     LOS: 9 days   [name],Wildon 01/28/2014,8:45 AM  I have seen and examined this patient and agree  with plan and assessment as above with highlighted additions.  On steroids for optic perineuritis, not much in the way of improvement. Still with eye pain, diplopia, ptosis. For HD tomorrow, possible home after/cbd Jennesis Ramaswamy B,MD 01/28/2014 12:44 PM

## 2014-01-28 NOTE — Progress Notes (Signed)
Physical Therapy  Note   Discussed case with OT and SW;  Given suboptimal situation at home, and ongoing gait and balance deficits,  Discharge to SNF for postacute rehab to maximize independence and safety with mobility is a reasonable plan;   Will update recommendations; Thanks,  Roney Marion, [place]  Acute Rehabilitation Services Pager [phone] Office [phone]

## 2014-01-28 NOTE — Progress Notes (Signed)
01/28/14 1425  PT - Assessment/Plan  PT Plan Discharge plan needs to be updated  PT Frequency Min 3X/week  Follow Up Recommendations SNF  PT equipment Rolling walker with 5" wheels (pt refusing RW, but agreeable to cane. [name] is not preferred)   Roney Marion, PT  Acute Rehabilitation Services Pager [phone] Office [phone]

## 2014-01-28 NOTE — Progress Notes (Signed)
Occupational Therapy Treatment Patient Details Name: [name] MRN: [id number] DOB: [birth year] Today's Date: 01/28/2014    History of present illness History of Present Illness: [name] is a 58 year old African American male with a history of hypertension, ESRD (TTS dialysis), and prior GI bleeds who presents with L eye pain and associated discharge and swelling as well as a L-sided headache.  Pt dx with Optic perineuritis.     OT comments  Pt progressing. Pt not able to open left eye. Education provided. Recommending SNF for rehab prior to d/c home.  Follow Up Recommendations  SNF;Supervision/Assistance - 24 hour    Equipment Recommendations  Tub/shower seat (flat shower chair)    Recommendations for Other Services      Precautions / Restrictions Precautions Precautions: Fall Precaution Comments: Pt unsteady on his feet and reporting lightheadedness with gait.  Restrictions Weight Bearing Restrictions: No       Mobility Bed Mobility          General bed mobility comments: not assessed  Transfers Overall transfer level: Needs assistance Equipment used: Straight cane Transfers: Sit to/from Stand Sit to Stand: Supervision                ADL Overall ADL's : Needs assistance/impaired     Grooming: Wash/dry face;Oral care;Applying deodorant;Brushing hair;Set up;Supervision/safety;Standing;Sitting (applied lotion)   Upper Body Bathing: Set up;Supervision/ safety;Standing   Lower Body Bathing: Set up;Supervison/ safety;Sit to/from stand   Upper Body Dressing : Set up;Sitting   Lower Body Dressing: Set up;Sitting/lateral leans (socks)   Toilet Transfer: Min guard;Ambulation (cane; chair)       Tub/ Shower Transfer: Min guard;Ambulation;Shower seat   Functional mobility during ADLs: Min guard;Cane General ADL Comments: Educated on tub transfer techniques and pt practiced stepping over. Explained technique of sitting on chair and swinging legs in but  pt states he will probably step over, so that is what we practiced. Educated on safety tips (safe shoewear, not pulling on shower rod, sitting for LB ADLs). Spoke with pt about going to SNF for rehab as his home situation does not sound great. Pt leaning on sink for balance during ADLs-tried to get pt not to lean and he said he needed it for balance. Pt states he does not want shower chair, so OT explained what he could use at home for shower chair.  Discussed energy conservation-pt getting tired at sink.      Vision                     Perception     Praxis      Cognition   Behavior During Therapy: [facility] for tasks assessed/performed Overall Cognitive Status: No family/caregiver present to determine baseline cognitive functioning (unsure of baseline) Area of Impairment: Safety/judgement          Safety/Judgement: Decreased awareness of safety          Extremity/Trunk Assessment               Exercises     Shoulder Instructions       General Comments      Pertinent Vitals/ Pain       Pain Assessment: 0-10 Pain Score: 7  Pain Location: left eye Pain Descriptors / Indicators: Aching Pain Intervention(s): Monitored during session  Home Living  Prior Functioning/Environment              Frequency Min 2X/week     Progress Toward Goals  OT Goals(current goals can now be found in the care plan section)  Progress towards OT goals: Progressing toward goals  Acute Rehab OT Goals Patient Stated Goal: not stated OT Goal Formulation: With patient Time For Goal Achievement: 02/06/14 Potential to Achieve Goals: Good ADL Goals Pt Will Perform Grooming: with modified independence;standing Pt Will Perform Upper Body Bathing: with modified independence;sitting Pt Will Perform Lower Body Bathing: with modified independence;sit to/from stand Pt Will Perform Upper Body Dressing: with modified  independence;sitting Pt Will Perform Lower Body Dressing: with modified independence;sit to/from stand Pt Will Transfer to Toilet: with modified independence;ambulating;regular height toilet Pt Will Perform Toileting - Clothing Manipulation and hygiene: with modified independence;sit to/from stand Pt Will Perform Tub/Shower Transfer: Tub transfer;with modified independence;ambulating Additional ADL Goal #1: Monitor ptosis and vision in L eye and provide adaptations as appropriate.  Plan Discharge plan remains appropriate    Co-evaluation                 End of Session Equipment Utilized During Treatment: Gait belt (cane)   Activity Tolerance Patient tolerated treatment well   Patient Left in chair;with call bell/phone within reach   Nurse Communication Patient requests pain meds        Time: 1320-1346 OT Time Calculation (min): 26 min  Charges: OT General Charges $OT Visit: 1 Procedure OT Treatments $Self Care/Home Management : 23-37 mins  Benito Mccreedy OTR/L [phone] 01/28/2014, 2:30 PM

## 2014-01-28 NOTE — Care Management Note (Signed)
CARE MANAGEMENT NOTE 01/28/2014  Patient:  [name],[name]   Account Number:  [id number]  Date Initiated:  01/23/2014  Documentation initiated by:  Lizabeth Leyden  Subjective/Objective Assessment:   admitted with cellulitis of left eye     Action/Plan:   progression of care and discharge planning  01/25/2014 Pt will d/c to home with friends, no d/c needs identified.  CRoyal RN MPH   Anticipated DC Date:  01/28/2014   Anticipated DC Plan:  [place]  CM consult      Choice offered to / List presented to:             Status of service:   Medicare Important Message given?  YES (If response is "NO", the following Medicare IM given date fields will be blank) Date Medicare IM given:  01/23/2014 Medicare IM given by:  Lizabeth Leyden Date Additional Medicare IM given:  01/25/2014 Additional Medicare IM given by:  [facility]  Discharge Disposition:  HOME/SELF CARE  Per UR Regulation:    If discussed at Long Length of Stay Meetings, dates discussed:    Comments:  01/28/2014  IM given to pt again today.  Met with pt re d/c plans, no HH needs needs identified.     CRoyal RN MPH, case manager, [phone]  01/23/2014 [place], [place] Met with patient regarding medical follow up and home health/DME.  His PCP Dr Mercy Moore, pharmacy [facility] on [address]. No issues with transportation.  Discussed home health services if MD orders for PT and OT. The patient declined any home health services. Asked about use of RW. He declined RW.

## 2014-01-28 NOTE — Progress Notes (Signed)
Subjective: [name] was seen in his room today. He reports that he eye feels less sore and says the pain is about a "7" compared to admission when the pain was "15." He still complains of inability to open his [name] and binocular diplopia. He says that he has been sitting and walking to the bathroom and feels he needs a cane, which he plans to use on discharge. When he sits up, he says that he sits for a while before standing up since he feels dizzy. He has not been able to use his eye patch.  He has been refusing home PT because of his living situation and feels that the people he lives with are not conducive to having them visit. He would like to see SW to discuss options. He feels that he is not ready to go home today and wants "eye to get better." We explained that it will take time and that he will have to be steroids for some time.   Objective: Vital signs in last 24 hours: Filed Vitals:   01/27/14 0754 01/27/14 1307 01/27/14 2121 01/28/14 0456  BP: 164/73  141/63 127/64  Pulse: 71  70 75  Temp: 98 F (36.7 C) 98 F (36.7 C) 98.1 F (36.7 C) 97.9 F (36.6 C)  TempSrc: Oral  Oral Oral  Resp: 16  16 16   Height:   5\' 9"  (1.753 m)   Weight:   58.877 kg (129 lb 12.8 oz)   SpO2: 94%  98% 100%   Weight change: 1.177 kg (2 lb 9.5 oz)  Intake/Output Summary (Last 24 hours) at 01/28/14 1055 Last data filed at 01/28/14 0844  Gross per 24 hour  Intake    480 ml  Output      0 ml  Net    480 ml   General: resting in bed in NAD  HEENT: less periorbital swelling on L compared to R from initial presentation; complete ptosis of [name]; binocular diplopia, EOM in L eye continue to be limited and L abduction may be more grossly intact; L and R eye pupils are somewhat reactive to light today and R eye pupil is constricted compared to the previous days  Cardiac: RRR, no rubs, murmurs or gallops  Pulm: clear to auscultation bilaterally, moving normal volumes of air  Abd: soft, nontender,  nondistended, BS present  Ext: warm and well perfused, no pedal edema.  Neuro: alert and oriented X3, responding appropriately, following commands. Gait not assessed today.    Lab Results: None  Medications:  Scheduled Meds: . calcium acetate  1,334 mg Oral TID WC  . docusate sodium  50 mg Oral Daily  . doxercalciferol  2 mcg Intravenous Q T,Th,Sa-HD  . feeding supplement (RESOURCE BREEZE)  1 Container Oral TID BM  . ferric gluconate (FERRLECIT/NULECIT) IV  62.5 mg Intravenous Q Thu-HD  . heparin  5,000 Units Subcutaneous 3 times per day  . levETIRAcetam  1,000 mg Oral QHS  . multivitamin  1 tablet Oral QHS  . pantoprazole  40 mg Oral Daily  . polyethylene glycol  17 g Oral Daily  . predniSONE  60 mg Oral Q breakfast  . sodium chloride  3 mL Intravenous Q12H  . sodium chloride  3 mL Intravenous Q12H   Continuous Infusions:  PRN Meds:.sodium chloride, sodium chloride, sodium chloride, acetaminophen, acetaminophen, alteplase, calcium acetate, diphenhydrAMINE, feeding supplement (NEPRO CARB STEADY), heparin, HYDROcodone-acetaminophen, HYDROmorphone (DILAUDID) injection, lidocaine (PF), lidocaine-prilocaine, pentafluoroprop-tetrafluoroeth, promethazine, sodium chloride  Assessment/Plan: Principal  Problem:   Optic neuritis, left Active Problems:   Hypertension   Diabetes mellitus without complication   ESRD on hemodialysis   Seizure disorder   Normocytic anemia   Cocaine abuse   Tobacco abuse   Protein-calorie malnutrition, severe  Optic perineuritis, left: Less swelling compared to admission, but still reports of inability to open [name] and binocular diplopia. Based on MRI/MRA of head and orbit on 10/05, no evidence of cavernous sinus thrombosis was seen and mild distention of L optic nerve sheath was noted. According to Dr. Posey Pronto, the significant inflammation of the perineural sheath of the L eye is likely consistent with optic perineuritis, a rare variant of orbital  pseudotumor. ACE level is normal, RPR is NR, ANCA antibodies negative. Dr. Tessa Lerner had mentioned a case report of recurrent optic perineuritis after intranasal cocaine abuse (Coppens et al. Neuro-Ophthalmology 30(2): [phone]) though patient reports smoking cocaine and very occasional use of it intranasally. Patient's MRI had noted minimal edema in the base of L maxillary sinus and CT maxillary from 10/4 noted a soft density at L maxilla eye with destructive changes of posterior molar, suggesting peridontal inflammation and with a further discussion with radiologist on 10/5, there are likely other abnormalities in his oral cavity including dental caries in his R molars and loose dental roots and given this, one can consider this serving as nidus of infection for his presentation.  - continue to appreciate ophthalmology recommendations  - transitioned to 60 mg PO prednisone on 10/10, patient tolerating well; talked to Dr. Posey Pronto who would like to discharge on 60 mg PO and see patient this week for follow-up - provided eye patch to help with patient's vision as patient reports binocular and has nearly complete ptosis of his [name]  - discontinued vancomycin and ceftriaxone 10/6   - continue Norco/Vicodin 5-325 mg q4h PRN pain  - continue 0.5 Dilaudid IV q8h PRN pain and wean off to q12h   Hypertension: continues to be orthostatic. Patient says he sits up and waits for a while before he starts waking.  I personally performed orthostatic vitals, 10/08. Supine BP was 185/80 to sitting was 165/85 and to standing was 130/70. Patient reported feeling dizzy upon standing and meets orthostatic hypotension criteria. Based on the review of his medications, hydralazine is a potential culprit given its vasodilatory effects. After decreasing the dose, patient reports less dizziness upon standing. Held hydralazine 10/12 and monitor his vitals.  --hold hydralazine, 10/12.  --decreased hydralazine dose from 100 mg to 50 mg  BID, 10/10 --continue orthostatic vitals  --may consider switching to another antihypertensive if patient continues to have orthostasis; patient has been on irbesartan before unsure how it was discontinued but may have to switch to this if he continues to have orthostatics  Diabetes Mellitus: Hgb A1c 5.0 July 2014. Serum BG 165. [name] not currently on treatment.  - may need to start SSI given initiation of steroids  - continue to monitor CBGs daily   ESRD on hemodialysis, TThS schedule: received HD yesterday, 10/10 - HD per nephrology   Cocaine abuse: patient has a long history of cocaine abuse with recent use being about three days ago before presentation.  - counseled on cessation   Seizure disorder:  - continue home Keppra   VTE ppx: heparin   Diet: renal diet with 1200 mL fluid restriction   Disposition: OT recommends patient needs SNF placement. PT recommends home PT services, which patient has denied given that the people he  live with are not conducive to having those services. He wants to talk to SW to discuss his living situation.  --consult SW to consider different living options  This is a Careers information officer Note.  The care of the patient was discussed with Dr. Megan Salon and Dr. Hayes Ludwig and the assessment and plan formulated with their assistance. Please see their attached note for official documentation of the daily encounter.   LOS: 9 days   [name], Med Student 01/28/2014, 10:55 AM

## 2014-01-28 NOTE — Progress Notes (Signed)
I have seen the patient and reviewed the daily progress note by Josiah Lobo MS 4 and discussed the care of the patient with them.  See below for documentation of my findings, assessment, and plans.  Subjective: This morning he reported still feeing dizzy with ambulation within his room. He still has diplopia and had lost his eye patch for his left eye but reports that his diplopia had improved while he was wearing his eye patch.   Objective: Vital signs in last 24 hours: Filed Vitals:   01/27/14 1307 01/27/14 2121 01/28/14 0456 01/28/14 0950  BP:  141/63 127/64 130/59  Pulse:  70 75 80  Temp: 98 F (36.7 C) 98.1 F (36.7 C) 97.9 F (36.6 C) 98 F (36.7 C)  TempSrc:  Oral Oral Oral  Resp:  16 16 16   Height:  5\' 9"  (1.753 m)    Weight:  129 lb 12.8 oz (58.877 kg)    SpO2:  98% 100% 100%   Weight change: 2 lb 9.5 oz (1.177 kg)  Intake/Output Summary (Last 24 hours) at 01/28/14 1622 Last data filed at 01/28/14 1450  Gross per 24 hour  Intake    480 ml  Output      0 ml  Net    480 ml   Vitals reviewed. General: resting in bed, in NAD HEENT: no scleral icterus, left eye ptosis (unable to open the eye) Cardiac: RRR, no rubs, murmurs or gallops Pulm: clear to auscultation bilaterally, no wheezes, rales, or rhonchi Abd: soft, nontender, nondistended, BS present Ext: warm and well perfused, no pedal edema Neuro: alert and oriented X3, moves all extremities voluntarily, follows commands appropriately.  Lab Results: Reviewed and documented in Electronic Record Micro Results: Reviewed and documented in Electronic Record Studies/Results: Reviewed and documented in Electronic Record Medications: I have reviewed the patient's current medications. Scheduled Meds: . calcium acetate  1,334 mg Oral TID WC  . docusate sodium  50 mg Oral Daily  . doxercalciferol  2 mcg Intravenous Q T,Th,Sa-HD  . feeding supplement (RESOURCE BREEZE)  1 Container Oral TID BM  . ferric  gluconate (FERRLECIT/NULECIT) IV  62.5 mg Intravenous Q Thu-HD  . heparin  5,000 Units Subcutaneous 3 times per day  . levETIRAcetam  1,000 mg Oral QHS  . multivitamin  1 tablet Oral QHS  . pantoprazole  40 mg Oral Daily  . polyethylene glycol  17 g Oral Daily  . predniSONE  60 mg Oral Q breakfast  . sodium chloride  3 mL Intravenous Q12H  . sodium chloride  3 mL Intravenous Q12H   Continuous Infusions:  PRN Meds:.sodium chloride, sodium chloride, sodium chloride, acetaminophen, acetaminophen, alteplase, calcium acetate, diphenhydrAMINE, feeding supplement (NEPRO CARB STEADY), heparin, HYDROcodone-acetaminophen, HYDROmorphone (DILAUDID) injection, lidocaine (PF), lidocaine-prilocaine, pentafluoroprop-tetrafluoroeth, promethazine, sodium chloride Assessment/Plan: 58 year old man with PMH of ESRD on HD, HTN, presenting with left eye swelling, pain, decreased motion, ptosis, and diplopia with MRI with findings c/w optic neuritis.   Optic Neuritis, left: Has persistent decreased EOM of left eye with ptosis. His diplopia improved with left eye patch. The etiology of his perineural sheath inflammation could be 2/2 intranasal cocaine use which he has done recently [at least one case report of such finding has been published, Coppens et al. Neuro-Ophthalmology 30(2): 91-95]. Of note, MRI also revealed right molar loose dental roots that could predispose him to infection. No signs of orbital cellulitis at this time with antibiotic tx discontinued on  -Ophthalmology following, appreciate Dr. Serita Grit help  with this patient -Continue steroid tx with prednisone 60mg  PO which will be continued at discharge at least until he is seen by Dr. Posey Pronto in outpatient setting.  -Continue left eye patch -Continue Norco PRN for pain -Discontinue IV Dilaudid with anticipated discharge approaching  HTN: Continues to have dizziness with ambulation. BP while supine still mildly elevated. Was on hydralazine 100mg  BID at home  which was reduced but he still had persistent orthostatsis.  -Hold hydralazine and reassess BP, orthostatic vitals  DM2: Stable, CBGs<200. HgbA1C of 5%. Continue monitoring.   ESRD on HD: On T/Th/Sat.  -HD per Nephrology  Seizure disorder: Stable, no seizure recently.  -Continue home Keppra  Cocaine abuse: Reports smoking crack cocaine and using it intranasally as well. Most recent use 3 days prior to presentation.  -Counseled on cocaine cessation.  Dispo: Disposition is deferred at this time, awaiting improvement of current medical problems.  Anticipated discharge in approximately 1-2 day(s). He needs placement at SNF, he is agreeable to this and the CSW has started this process. He also needs affordable housing for after his discharge from SNF and he CSW is also assisting him with this--he is on a fixed disability income, lives with "strangers" in an apartment at this time with not much social support.   The patient does have a current PCP Windy Kalata, MD) and does not need an [facility] hospital follow-up appointment after discharge.  The patient does have transportation limitations that hinder transportation to clinic appointments.  .Services Needed at time of discharge: Y = Yes, Blank = No PT:   OT:   RN:   Equipment: Flat shower chair, refuses walker  Other: Needs SNF placement    LOS: 9 days   Blain Pais, MD PGY3, IMTS 01/28/2014, 4:22 PM

## 2014-01-28 NOTE — Progress Notes (Signed)
Patient ID: [name], male   DOB: 1955-06-17, 58 y.o.   MRN: [id number]        Attending progress note    Date of Admission:  01/19/2014     Principal Problem:   Optic neuritis, left Active Problems:   Hypertension   Diabetes mellitus without complication   ESRD on hemodialysis   Seizure disorder   Normocytic anemia   Cocaine abuse   Tobacco abuse   Protein-calorie malnutrition, severe   . calcium acetate  1,334 mg Oral TID WC  . docusate sodium  50 mg Oral Daily  . doxercalciferol  2 mcg Intravenous Q T,Th,Sa-HD  . feeding supplement (RESOURCE BREEZE)  1 Container Oral TID BM  . ferric gluconate (FERRLECIT/NULECIT) IV  62.5 mg Intravenous Q Thu-HD  . heparin  5,000 Units Subcutaneous 3 times per day  . levETIRAcetam  1,000 mg Oral QHS  . multivitamin  1 tablet Oral QHS  . pantoprazole  40 mg Oral Daily  . polyethylene glycol  17 g Oral Daily  . predniSONE  60 mg Oral Q breakfast  . sodium chloride  3 mL Intravenous Q12H  . sodium chloride  3 mL Intravenous Q12H    I have seen and examined [name] with our team today. 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 We will continue oral prednisone. We will also hold his hydralazine because of continued orthostatic hypotension. He should be ready for discharge after hemodialysis tomorrow with close outpatient followup.  Michel Bickers, MD [facility] for [facility] [phone] pager   [phone] cell 01/28/2014, 12:08 PM

## 2014-01-29 DIAGNOSIS — F1410 Cocaine abuse, uncomplicated: Secondary | ICD-10-CM

## 2014-01-29 DIAGNOSIS — D649 Anemia, unspecified: Secondary | ICD-10-CM

## 2014-01-29 DIAGNOSIS — I951 Orthostatic hypotension: Secondary | ICD-10-CM

## 2014-01-29 DIAGNOSIS — E43 Unspecified severe protein-calorie malnutrition: Secondary | ICD-10-CM

## 2014-01-29 LAB — RENAL FUNCTION PANEL
ALBUMIN: 3 g/dL — AB (ref 3.5–5.2)
Anion gap: 18 — ABNORMAL HIGH (ref 5–15)
BUN: 81 mg/dL — ABNORMAL HIGH (ref 6–23)
CHLORIDE: 90 meq/L — AB (ref 96–112)
CO2: 23 meq/L (ref 19–32)
CREATININE: 9.6 mg/dL — AB (ref 0.50–1.35)
Calcium: 9.2 mg/dL (ref 8.4–10.5)
GFR calc Af Amer: 6 mL/min — ABNORMAL LOW (ref >90)
GFR, EST NON AFRICAN AMERICAN: 5 mL/min — AB (ref >90)
Glucose, Bld: 142 mg/dL — ABNORMAL HIGH (ref 70–99)
Phosphorus: 2.7 mg/dL (ref 2.3–4.6)
Potassium: 4.5 mEq/L (ref 3.7–5.3)
SODIUM: 131 meq/L — AB (ref 137–147)

## 2014-01-29 LAB — CBC
HEMATOCRIT: 33.4 % — AB (ref 39.0–52.0)
Hemoglobin: 11.4 g/dL — ABNORMAL LOW (ref 13.0–17.0)
MCH: 28.6 pg (ref 26.0–34.0)
MCHC: 34.1 g/dL (ref 30.0–36.0)
MCV: 83.7 fL (ref 78.0–100.0)
PLATELETS: 185 10*3/uL (ref 150–400)
RBC: 3.99 MIL/uL — ABNORMAL LOW (ref 4.22–5.81)
RDW: 15 % (ref 11.5–15.5)
WBC: 9.6 10*3/uL (ref 4.0–10.5)

## 2014-01-29 MED ORDER — BOOST / RESOURCE BREEZE PO LIQD: 1.0000 | Freq: Three times a day (TID) | ORAL | Status: DC

## 2014-01-29 MED ORDER — HYDROCODONE-ACETAMINOPHEN 5-325 MG PO TABS
ORAL_TABLET | ORAL | Status: DC
Start: 2014-01-29 — End: 2014-01-29
  Filled 2014-01-29: qty 2

## 2014-01-29 MED ORDER — DOXERCALCIFEROL 4 MCG/2ML IV SOLN
INTRAVENOUS | Status: AC
  Filled 2014-01-29: qty 2

## 2014-01-29 MED ORDER — CALCIUM ACETATE 667 MG PO CAPS
667.0000 mg | ORAL_CAPSULE | Freq: Three times a day (TID) | ORAL | Status: DC
  Administered 2014-01-29 (×2): 667 mg via ORAL
  Filled 2014-01-29 (×3): qty 1

## 2014-01-29 MED ORDER — PREDNISONE 20 MG PO TABS: 60.0000 mg | ORAL_TABLET | Freq: Every day | ORAL | Status: DC

## 2014-01-29 MED ORDER — HYDROCODONE-ACETAMINOPHEN 5-325 MG PO TABS: 1.0000 | ORAL_TABLET | Freq: Four times a day (QID) | ORAL | Status: DC | PRN

## 2014-01-29 NOTE — Progress Notes (Signed)
Patient ID: [name], male   DOB: Aug 21, 1955, 58 y.o.   MRN: [id number]        Attending progress note    Date of Admission:  01/19/2014     Principal Problem:   Optic neuritis, left Active Problems:   Hypertension   Diabetes mellitus without complication   ESRD on hemodialysis   Seizure disorder   Normocytic anemia   Cocaine abuse   Tobacco abuse   Protein-calorie malnutrition, severe   Orthostatic hypotension   . calcium acetate  667 mg Oral TID WC  . docusate sodium  50 mg Oral Daily  . doxercalciferol      . doxercalciferol  2 mcg Intravenous Q T,Th,Sa-HD  . feeding supplement (RESOURCE BREEZE)  1 Container Oral TID BM  . ferric gluconate (FERRLECIT/NULECIT) IV  62.5 mg Intravenous Q Thu-HD  . heparin  5,000 Units Subcutaneous 3 times per day  . HYDROcodone-acetaminophen      . levETIRAcetam  1,000 mg Oral QHS  . multivitamin  1 tablet Oral QHS  . pantoprazole  40 mg Oral Daily  . polyethylene glycol  17 g Oral Daily  . predniSONE  60 mg Oral Q breakfast  . sodium chloride  3 mL Intravenous Q12H  . sodium chloride  3 mL Intravenous Q12H    Seen [name] today with our team. 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 We will do further research to see if there other treatment options. We will also continue to try to optimize blood pressure control while minimizing his chronic orthostasis. We are waiting on skilled nursing facility placement options.  Michel Bickers, MD [facility] for [facility] [phone] pager   [phone] cell 01/29/2014, 11:32 AM

## 2014-01-29 NOTE — Progress Notes (Signed)
Subjective: [name] was seen and examined this morning on HD.  [name] wearing a patch on his left eye and is now able to watch television.  His pain is controlled with po pain medications.  He had a little more endurance when he walked with PT yesterday and was able to bathe himself when he returned to his room (the first time he has been able to do this himself).  He did experience some lightheadedness towards the end of his walk with PT.  He says he experiences this at home too and does not relate it to HD days.  [name] agreeable to discharge to SNF for short term rehab.     Objective: Vital signs in last 24 hours: Filed Vitals:   01/28/14 1627 01/28/14 2121 01/29/14 0558 01/29/14 0650  BP: 177/78 184/82 161/77 164/82  Pulse: 70 77 73 70  Temp: 97.5 F (36.4 C) 98.2 F (36.8 C) 98.9 F (37.2 C) 98.3 F (36.8 C)  TempSrc: Oral Oral Oral Oral  Resp: 16 16 16 14   Height:      Weight:    60.2 kg (132 lb 11.5 oz)  SpO2:  100% 100% 98%   Weight change: 1.323 kg (2 lb 14.7 oz)  Intake/Output Summary (Last 24 hours) at 01/29/14 0814 Last data filed at 01/29/14 0611  Gross per 24 hour  Intake    840 ml  Output      1 ml  Net    839 ml   Vitals reviewed. General: resting in HD bed watching TV, in NAD HEENT: left eye covered with eye patch Cardiac: RRR, no rubs, murmurs or gallops Pulm: clear to auscultation bilaterally, no wheezes, rales, or rhonchi Abd: soft, nontender, nondistended, BS present Ext: warm and well perfused, no pedal edema Neuro: alert and oriented X3, moves all extremities voluntarily, follows commands appropriately.   Lab Results: Reviewed and documented in Electronic Record Micro Results: Reviewed and documented in Electronic Record Studies/Results: Reviewed and documented in Electronic Record Medications: I have reviewed the patient's current medications. Scheduled Meds: . calcium acetate  1,334 mg Oral TID WC  . docusate sodium  50 mg Oral Daily  .  doxercalciferol  2 mcg Intravenous Q T,Th,Sa-HD  . feeding supplement (RESOURCE BREEZE)  1 Container Oral TID BM  . ferric gluconate (FERRLECIT/NULECIT) IV  62.5 mg Intravenous Q Thu-HD  . heparin  5,000 Units Subcutaneous 3 times per day  . levETIRAcetam  1,000 mg Oral QHS  . multivitamin  1 tablet Oral QHS  . pantoprazole  40 mg Oral Daily  . polyethylene glycol  17 g Oral Daily  . predniSONE  60 mg Oral Q breakfast  . sodium chloride  3 mL Intravenous Q12H  . sodium chloride  3 mL Intravenous Q12H   Continuous Infusions:  PRN Meds:.sodium chloride, sodium chloride, sodium chloride, acetaminophen, acetaminophen, calcium acetate, diphenhydrAMINE, feeding supplement (NEPRO CARB STEADY), heparin, HYDROcodone-acetaminophen, lidocaine (PF), lidocaine-prilocaine, pentafluoroprop-tetrafluoroeth, promethazine, sodium chloride  Assessment/Plan: 58 year old man with PMH of ESRD on HD, HTN, presenting with left eye swelling, pain, decreased motion, ptosis, and diplopia with MRI with findings c/w optic neuritis.   Optic perineuritis, left: [name] has persistent decreased EOM of left eye with ptosis.  His diplopia resolved with left eye patch; he has been more comfortable with the patch in place.   Ophthalmology following, appreciate Dr. Serita Grit help with this patient -Continue steroid tx with prednisone 60mg  PO which will be continued at discharge at least  until he is seen by Dr. Posey Pronto in outpatient setting on 10/16. -Continue left eye patch -Continue Norco PRN for pain; provided at dischargee  HTN: Continues to have dizziness with ambulation. BP while supine still mildly elevated. Was on hydralazine 100mg  BID at home which was reduced but he still had persistent orthostatsis.  -D/c hydralazine in context of patient's persistent orthostatsis, which he reports experiencing at home.   DM2: Stable, CBGs<200. HgbA1C of 5%. Continue monitoring.   ESRD on HD: On T/Th/Sat.  -HD per Nephrology; to be  continued on Thursday as outpatient  Seizure disorder: Stable, no seizure recently.  -Continue home Keppra  Cocaine abuse: Reports smoking crack cocaine and using it intranasally as well. Most recent use 3 days prior to presentation.  -Counseled on cocaine cessation.   Dispo: [name] stable for discharge to SNF.   The patient does have a current PCP [name] Kalata, MD) and does not need an [facility] hospital follow-up appointment after discharge.  The patient does have transportation limitations that hinder transportation to clinic appointments.  .Services Needed at time of discharge: Y = Yes, Blank = No PT:   OT:   RN:   Equipment: Flat shower chair, refuses walker  Other: Needs SNF placement    LOS: 10 days   Francesca Oman, DO PGY2, IMTS 01/29/2014, 8:14 AM

## 2014-01-29 NOTE — Progress Notes (Signed)
Patient received 159 minutes of hemodialysis. Hemodialysis ordered 240 minutes. Patient signed off AMA due to cramping. Patient offered normal saline to alleviate cramping, patient refused and requested to terminate treatment. Patient urged to complete treatment as ordered. Patient is stable. Blood pressure 135/69 with heart rate of 70 bpm. Potassium of 4.5 with minimal fluid gains. Dr Lorrene Reid has been made aware of patients decision

## 2014-01-29 NOTE — Progress Notes (Signed)
  Subjective:   Seen in dialysis Left eye patched today Appears may be looking at SNF for a while  Objective: Vital signs in last 24 hours: Temp:  [97.5 F (36.4 C)-98.9 F (37.2 C)] 98.3 F (36.8 C) (10/13 0650) Pulse Rate:  [70-80] 70 (10/13 0650) Resp:  [14-16] 14 (10/13 0650) BP: (130-184)/(59-82) 164/82 mmHg (10/13 0650) SpO2:  [98 %-100 %] 98 % (10/13 0650) Weight:  [60.2 kg (132 lb 11.5 oz)] 60.2 kg (132 lb 11.5 oz) (10/13 0650) Weight change: 1.323 kg (2 lb 14.7 oz)    Physical Exam:  General appearance: Comfortable. On dialysis.  Eye patched.   Resp: CTA bilat  Cardio: RRR no murmur or rub or gallop  GI: + BS, soft and nontender , nondistended  Extremities: No pedal edema scaly alligator like skin LE's Access: AVF @ LUA with + bruit - cannulated and at BFR 400  Lab Results:  Recent Labs  01/27/14 0452 01/29/14 0705  WBC 4.3 9.6  HGB 12.1* 11.4*  HCT 36.3* 33.4*  PLT 161 185   BMET:   Recent Labs  01/29/14 0705  NA 131*  K 4.5  CL 90*  CO2 23  GLUCOSE 142*  BUN 81*  CREATININE 9.60*  CALCIUM 9.2  ALBUMIN 3.0*  Phosphorus 2.7   Medications . calcium acetate  1,334 mg Oral TID WC  . docusate sodium  50 mg Oral Daily  . doxercalciferol  2 mcg Intravenous Q T,Th,Sa-HD  . feeding supplement (RESOURCE BREEZE)  1 Container Oral TID BM  . ferric gluconate (FERRLECIT/NULECIT) IV  62.5 mg Intravenous Q Thu-HD  . heparin  5,000 Units Subcutaneous 3 times per day  . levETIRAcetam  1,000 mg Oral QHS  . multivitamin  1 tablet Oral QHS  . pantoprazole  40 mg Oral Daily  . polyethylene glycol  17 g Oral Daily  . predniSONE  60 mg Oral Q breakfast  . sodium chloride  3 mL Intravenous Q12H  . sodium chloride  3 mL Intravenous Q12H   Dialysis Orders: TTS @ AF  58.5 kg 4 hrs 2K/2.25Ca 400/A1.5 No Heparin AVF @ LUA  Hectorol 2Aranesp 80 mcg 9/29 and Venofer 50 mg on Thurs.   Assessment/Plan: 1. Left eye pain/optic perineuritis with persistent ptosis and  diplopia - pain controlled. MRI 10/6 c/w optic perineuritis, per Ophthalmology and ID; antibiotics dc'd .Now on oral prednisone  2. ESRD - HD on TTS @ [facility] 3. HTN/volume - BP 127/64, Hydralazine on hold sec to dizziness on standing (was also on Irbesartan 150 mg qd as outpatient if more BP medication is needed); below EDW.  4. Anemia - Hgb 12.1, Aranesp on hold, continue weekly Fe.  5. Sec HPT - Hectorol 2 mcg, Phoslo 2 with meals. Phos 2.7. Reduce phoslo to 1ac 6. Nutrition - renal diet, multivitamin, supplement. 7. DM 2 - no meds. 8. Hepatitis C  9. Seizure disorder - on Keppra.  10. Cocaine abuse - admits smoking crack 3 days prior to admission, denies any intranasal use.  [place] term SNF   Jamal Maes, MD [facility] [phone] Pager 01/29/2014, 8:33 AM

## 2014-01-29 NOTE — Procedures (Signed)
I have personally attended this patient's dialysis session.   2K bath K 4.5 Left AVF 400 No heparin BP stable  Jamal Maes, MD [facility] [phone] Pager 01/29/2014, 8:36 AM

## 2014-01-29 NOTE — Progress Notes (Signed)
Patient discharged to [facility] via transport. Larnce Schnackenberg, Wonda Cheng, Therapist, sports

## 2014-01-29 NOTE — Clinical Social Work Placement (Addendum)
Clinical Social Work Department CLINICAL SOCIAL WORK PLACEMENT NOTE 01/29/2014  Patient:  [name], [name]  Account Number:  [id number] Admit date:  01/19/2014  Clinical Social Worker:  Ryker Pherigo [name], LCSW  Date/time:  01/29/2014 11:56 AM  Clinical Social Work is seeking post-discharge placement for this patient at the following level of care:   [place]   (*CSW will update this form in [place] as items are completed)   01/28/2014  Patient/family provided with [place] Department of Clinical Social Work's list of facilities offering this level of care within the geographic area requested by the patient (or if unable, by the patient's family).  01/28/2014  Patient/family informed of their freedom to choose among providers that offer the needed level of care, that participate in Medicare, Medicaid or managed care program needed by the patient, have an available bed and are willing to accept the patient.    Patient/family informed of [facility]' ownership interest in [facility], as well as of the fact that they are under no obligation to receive care at this facility.  PASARR submitted to EDS on 01/28/2014 PASARR number received on 01/28/2014  FL2 transmitted to all facilities in geographic area requested by pt/family on  01/28/2014 FL2 transmitted to all facilities within larger geographic area on   Patient informed that his/her managed care company has contracts with or will negotiate with  certain facilities, including the following:     Patient/family informed of bed offers received:  01/28/2014 Patient chooses bed at [facility] Physician recommends and patient chooses bed at    Patient to be transferred to [facility] on 01/29/2014 Patient to be transferred to facility by  Patient and family notified of transfer on  Name of family member notified:    The following physician request were entered in Epic:  Additional Comments:

## 2014-01-29 NOTE — Progress Notes (Signed)
  I have seen and examined the patient, and reviewed the daily progress note by Josiah Lobo, MS4 and discussed the care of the patient with them. Please see my progress note from 01/29/2014 for further details regarding assessment and plan.    Signed:  Francesca Oman, DO 01/29/2014, 9:32 PM

## 2014-01-29 NOTE — Progress Notes (Signed)
Personal item in a closed security envelope given to and signed by patient.

## 2014-01-29 NOTE — Discharge Instructions (Signed)
[name], you were admitted to the hospital because of your L eye pain and swelling. We believe it is due to optic perineuritis, which means inflammation surrounding the optic nerve, the nerve responsible for your vision. Dr. Posey Pronto of Pediatric Opthalmology was consulted and suggested to continue 60 mg of prednisone for now and would like to follow-up with you in her clinic THIS Friday, 02/01/14 AT 11:30 A.M. PLEASE ARRIVE AT 11:15 A.M.   A few recommendations: 1) CONTINUE 60 mg prednisone 2) You may continue to wear the eye patch to help with preventing yourself from seeing double vision.  3) AVOID use of cocaine or other drugs

## 2014-01-29 NOTE — Progress Notes (Signed)
Subjective: [name] was seen in HD this morning. He said that his eye was feeling a bit more sore due to some pressure from the eye patch that was placed yesterday. After the examiner took some layers out and put it back on, he said that it felt better. He does think having the eye patch prevents the double vision and he is able to watch TV during dialysis.  Patient says that he still felt dizzy even at home when he stands up and that is has been a chronic issue. He was also complaining of cramps during his dialysis session and requested it to be stopped. He still asks if he will be able to open his [name] ever again. [name] willing to go to a SNF on discharge.    Objective: Vital signs in last 24 hours: Filed Vitals:   01/29/14 0900 01/29/14 0944 01/29/14 0946 01/29/14 1038  BP: 113/60 118/64 135/69 156/80  Pulse: 74 72 70 72  Temp:   98.4 F (36.9 C) 98.2 F (36.8 C)  TempSrc:   Oral Oral  Resp: 12 13 12 12   Height:      Weight:   59.9 kg (132 lb 0.9 oz)   SpO2:   98% 100%   Weight change: 1.323 kg (2 lb 14.7 oz)  Intake/Output Summary (Last 24 hours) at 01/29/14 1116 Last data filed at 01/29/14 0946  Gross per 24 hour  Intake    600 ml  Output    486 ml  Net    114 ml   General: resting in bed in NAD  HEENT: Eye patch on L eye. Less periorbital swelling on L compared to R from initial presentation; complete ptosis of [name]; binocular diplopia, EOM in L eye continue to be limited and L abduction may be more grossly intact; L and R eye pupils are somewhat reactive to light today and R eye pupil is constricted compared to the previous days  Cardiac: RRR, no rubs, murmurs or gallops  Pulm: clear to auscultation bilaterally, moving normal volumes of air  Abd: soft, nontender, nondistended, BS present  Ext: warm and well perfused, no pedal edema.  Neuro: alert and oriented X3, responding appropriately, following commands. Gait not assessed today.    Lab Results:  None  Medications:  Scheduled Meds: . calcium acetate  667 mg Oral TID WC  . docusate sodium  50 mg Oral Daily  . doxercalciferol      . doxercalciferol  2 mcg Intravenous Q T,Th,Sa-HD  . feeding supplement (RESOURCE BREEZE)  1 Container Oral TID BM  . ferric gluconate (FERRLECIT/NULECIT) IV  62.5 mg Intravenous Q Thu-HD  . heparin  5,000 Units Subcutaneous 3 times per day  . HYDROcodone-acetaminophen      . levETIRAcetam  1,000 mg Oral QHS  . multivitamin  1 tablet Oral QHS  . pantoprazole  40 mg Oral Daily  . polyethylene glycol  17 g Oral Daily  . predniSONE  60 mg Oral Q breakfast  . sodium chloride  3 mL Intravenous Q12H  . sodium chloride  3 mL Intravenous Q12H   Continuous Infusions:  PRN Meds:.sodium chloride, acetaminophen, acetaminophen, calcium acetate, diphenhydrAMINE, HYDROcodone-acetaminophen, promethazine, sodium chloride  Assessment/Plan: Principal Problem:   Optic neuritis, left Active Problems:   Hypertension   Diabetes mellitus without complication   ESRD on hemodialysis   Seizure disorder   Normocytic anemia   Cocaine abuse   Tobacco abuse   Protein-calorie malnutrition, severe   Orthostatic  hypotension  Optic perineuritis, left: Less swelling compared to admission, but still reports of inability to open [name] and binocular diplopia. Based on MRI/MRA of head and orbit on 10/05, no evidence of cavernous sinus thrombosis was seen and mild distention of L optic nerve sheath was noted. According to [name], the significant inflammation of the perineural sheath of the L eye is likely consistent with optic perineuritis, a rare variant of orbital pseudotumor. ACE level is normal, RPR is NR, ANCA antibodies negative. [name] had mentioned a case report of recurrent optic perineuritis after intranasal cocaine abuse (Coppens et al. Neuro-Ophthalmology 30(2): [phone]) though patient reports smoking cocaine and very occasional use of it intranasally. Patient's MRI  had noted minimal edema in the base of L maxillary sinus and CT maxillary from 10/4 noted a soft density at L maxilla eye with destructive changes of posterior molar, suggesting peridontal inflammation and with a further discussion with radiologist on 10/5, there are likely other abnormalities in his oral cavity including dental caries in his R molars and loose dental roots and given this, one can consider this serving as nidus of infection for his presentation.  - appreciate ophthalmology recommendations  - transitioned to 60 mg PO prednisone on 10/10, patient tolerating well; talked to [name] who would like to discharge on 60 mg PO and see patient this week for follow-up - provided eye patch to help with patient's vision as patient reports binocular and has nearly complete ptosis of his [name]; patient does feel it helps with seeing without double vision - discontinued vancomycin and ceftriaxone 10/6   - continue Norco/Vicodin 5-325 mg q4h PRN pain  - discontinued 0.5 Dilaudid IV q8h PRN pain   Orthostatic Hypotension: continues to be orthostatic after holding hydralazine. Patient says he sits up and waits for a while before he starts walking. As per [name] note, patient is below EDW which play a role but patient reports orthostatic symptoms as a chronic issue and may be due to delayed baroreceptor reflex secondary to age.  I personally performed orthostatic vitals, 10/08. Supine BP was 185/80 to sitting was 165/85 and to standing was 130/70. Patient reported feeling dizzy upon standing and meets orthostatic hypotension criteria.  --held hydralazine, 10/12.  --may consider switching to another antihypertensive if patient continues to have orthostasis; patient has been on irbesartan before unsure how it was discontinued but may have to switch to this if he continues to have orthostatics  Diabetes Mellitus: Hgb A1c 5.0 July 2014. Serum BG 165. [name] not currently on treatment. Has not needed  SSI after initiation of steroids   ESRD on hemodialysis, TThS schedule: received HD today, 10/13 but stopped early due to cramps. - HD per nephrology   Cocaine abuse: patient has a long history of cocaine abuse with recent use being about three days ago before presentation.  - counseled on cessation   Seizure disorder:  - continue home Keppra   VTE ppx: heparin   Diet: renal diet with 1200 mL fluid restriction   Disposition: OT recommends patient needs SNF placement and patient agrees to go. PT recommends home PT services, which patient has denied given that the people he live with are not conducive to having those services.  --consulted and appreciate SW to offer living options for patient  --patient has been approved for housing and may go after SNF placement   This is a Careers information officer Note.  The care of the patient was discussed with  Dr. Megan Salon and Dr. Hayes Ludwig and the assessment and plan formulated with their assistance. Please see their attached note for official documentation of the daily encounter.   LOS: 10 days   [name], Med Student 01/29/2014, 11:16 AM

## 2014-02-01 ENCOUNTER — Encounter (HOSPITAL_COMMUNITY): Payer: Self-pay | Admitting: Emergency Medicine

## 2014-02-01 ENCOUNTER — Inpatient Hospital Stay (HOSPITAL_COMMUNITY)
Admission: EM | Admit: 2014-02-01 | Discharge: 2014-02-05 | DRG: 682 | Disposition: A | Discharge status: Skilled Nursing Facility | Payer: Medicare Other | Attending: Internal Medicine | Admitting: Internal Medicine

## 2014-02-01 ENCOUNTER — Emergency Department (HOSPITAL_COMMUNITY): Payer: Medicare Other

## 2014-02-01 DIAGNOSIS — Z79899 Other long term (current) drug therapy: Secondary | ICD-10-CM | POA: Diagnosis not present

## 2014-02-01 DIAGNOSIS — I10 Essential (primary) hypertension: Secondary | ICD-10-CM | POA: Diagnosis present

## 2014-02-01 DIAGNOSIS — Z992 Dependence on renal dialysis: Secondary | ICD-10-CM | POA: Diagnosis not present

## 2014-02-01 DIAGNOSIS — G40909 Epilepsy, unspecified, not intractable, without status epilepticus: Secondary | ICD-10-CM

## 2014-02-01 DIAGNOSIS — D638 Anemia in other chronic diseases classified elsewhere: Secondary | ICD-10-CM | POA: Diagnosis present

## 2014-02-01 DIAGNOSIS — H469 Unspecified optic neuritis: Secondary | ICD-10-CM | POA: Diagnosis present

## 2014-02-01 DIAGNOSIS — B1920 Unspecified viral hepatitis C without hepatic coma: Secondary | ICD-10-CM | POA: Diagnosis present

## 2014-02-01 DIAGNOSIS — I5032 Chronic diastolic (congestive) heart failure: Secondary | ICD-10-CM | POA: Diagnosis present

## 2014-02-01 DIAGNOSIS — E43 Unspecified severe protein-calorie malnutrition: Secondary | ICD-10-CM | POA: Diagnosis present

## 2014-02-01 DIAGNOSIS — I120 Hypertensive chronic kidney disease with stage 5 chronic kidney disease or end stage renal disease: Principal | ICD-10-CM | POA: Diagnosis present

## 2014-02-01 DIAGNOSIS — I160 Hypertensive urgency: Secondary | ICD-10-CM

## 2014-02-01 DIAGNOSIS — N2581 Secondary hyperparathyroidism of renal origin: Secondary | ICD-10-CM | POA: Diagnosis present

## 2014-02-01 DIAGNOSIS — H5712 Ocular pain, left eye: Secondary | ICD-10-CM | POA: Diagnosis not present

## 2014-02-01 DIAGNOSIS — N186 End stage renal disease: Secondary | ICD-10-CM | POA: Diagnosis present

## 2014-02-01 DIAGNOSIS — M792 Neuralgia and neuritis, unspecified: Secondary | ICD-10-CM | POA: Diagnosis not present

## 2014-02-01 DIAGNOSIS — Z96652 Presence of left artificial knee joint: Secondary | ICD-10-CM | POA: Diagnosis present

## 2014-02-01 DIAGNOSIS — F17210 Nicotine dependence, cigarettes, uncomplicated: Secondary | ICD-10-CM | POA: Diagnosis present

## 2014-02-01 DIAGNOSIS — F1410 Cocaine abuse, uncomplicated: Secondary | ICD-10-CM | POA: Diagnosis present

## 2014-02-01 DIAGNOSIS — E119 Type 2 diabetes mellitus without complications: Secondary | ICD-10-CM

## 2014-02-01 DIAGNOSIS — I951 Orthostatic hypotension: Secondary | ICD-10-CM

## 2014-02-01 DIAGNOSIS — R51 Headache: Secondary | ICD-10-CM | POA: Diagnosis not present

## 2014-02-01 LAB — I-STAT CHEM 8, ED
BUN: 58 mg/dL — AB (ref 6–23)
Calcium, Ion: 0.96 mmol/L — ABNORMAL LOW (ref 1.12–1.23)
Chloride: 105 mEq/L (ref 96–112)
Creatinine, Ser: 7.4 mg/dL — ABNORMAL HIGH (ref 0.50–1.35)
Glucose, Bld: 154 mg/dL — ABNORMAL HIGH (ref 70–99)
HCT: 45 % (ref 39.0–52.0)
HEMOGLOBIN: 15.3 g/dL (ref 13.0–17.0)
Potassium: 4.3 mEq/L (ref 3.7–5.3)
Sodium: 133 mEq/L — ABNORMAL LOW (ref 137–147)
TCO2: 26 mmol/L (ref 0–100)

## 2014-02-01 LAB — COMPREHENSIVE METABOLIC PANEL
ALBUMIN: 3.5 g/dL (ref 3.5–5.2)
ALT: 48 U/L (ref 0–53)
AST: 36 U/L (ref 0–37)
Alkaline Phosphatase: 61 U/L (ref 39–117)
Anion gap: 19 — ABNORMAL HIGH (ref 5–15)
BILIRUBIN TOTAL: 0.3 mg/dL (ref 0.3–1.2)
BUN: 64 mg/dL — AB (ref 6–23)
CHLORIDE: 94 meq/L — AB (ref 96–112)
CO2: 24 mEq/L (ref 19–32)
CREATININE: 7.28 mg/dL — AB (ref 0.50–1.35)
Calcium: 9.1 mg/dL (ref 8.4–10.5)
GFR calc Af Amer: 9 mL/min — ABNORMAL LOW (ref >90)
GFR calc non Af Amer: 7 mL/min — ABNORMAL LOW (ref >90)
Glucose, Bld: 153 mg/dL — ABNORMAL HIGH (ref 70–99)
Potassium: 4.7 mEq/L (ref 3.7–5.3)
Sodium: 137 mEq/L (ref 137–147)
TOTAL PROTEIN: 8.2 g/dL (ref 6.0–8.3)

## 2014-02-01 LAB — CBC WITH DIFFERENTIAL/PLATELET
BASOS ABS: 0 10*3/uL (ref 0.0–0.1)
BASOS PCT: 0 % (ref 0–1)
Eosinophils Absolute: 0.1 10*3/uL (ref 0.0–0.7)
Eosinophils Relative: 1 % (ref 0–5)
HEMATOCRIT: 40.4 % (ref 39.0–52.0)
Hemoglobin: 13.6 g/dL (ref 13.0–17.0)
Lymphocytes Relative: 13 % (ref 12–46)
Lymphs Abs: 1.3 10*3/uL (ref 0.7–4.0)
MCH: 29.4 pg (ref 26.0–34.0)
MCHC: 33.7 g/dL (ref 30.0–36.0)
MCV: 87.4 fL (ref 78.0–100.0)
MONOS PCT: 9 % (ref 3–12)
Monocytes Absolute: 0.9 10*3/uL (ref 0.1–1.0)
NEUTROS ABS: 7.5 10*3/uL (ref 1.7–7.7)
Neutrophils Relative %: 77 % (ref 43–77)
Platelets: 214 10*3/uL (ref 150–400)
RBC: 4.62 MIL/uL (ref 4.22–5.81)
RDW: 15.6 % — AB (ref 11.5–15.5)
WBC: 9.9 10*3/uL (ref 4.0–10.5)

## 2014-02-01 LAB — I-STAT TROPONIN, ED: TROPONIN I, POC: 0.13 ng/mL — AB (ref 0.00–0.08)

## 2014-02-01 LAB — TROPONIN I: Troponin I: <0.3 ng/mL (ref <0.30)

## 2014-02-01 LAB — MRSA PCR SCREENING: MRSA by PCR: NEGATIVE

## 2014-02-01 LAB — SEDIMENTATION RATE: SED RATE: 16 mm/h (ref 0–16)

## 2014-02-01 MED ORDER — HEPARIN SODIUM (PORCINE) 5000 UNIT/ML IJ SOLN
5000.0000 [IU] | Freq: Three times a day (TID) | INTRAMUSCULAR | Status: DC
  Administered 2014-02-01 – 2014-02-04 (×10): 5000 [IU] via SUBCUTANEOUS
  Filled 2014-02-01 (×14): qty 1

## 2014-02-01 MED ORDER — SODIUM CHLORIDE 0.9 % IV SOLN: 250.0000 mL | INTRAVENOUS | Status: DC | PRN

## 2014-02-01 MED ORDER — HYDROMORPHONE HCL 1 MG/ML IJ SOLN
0.5000 mg | Freq: Four times a day (QID) | INTRAMUSCULAR | Status: DC | PRN
  Administered 2014-02-01 – 2014-02-04 (×7): 0.5 mg via INTRAVENOUS
  Filled 2014-02-01 (×7): qty 1

## 2014-02-01 MED ORDER — HYDROCODONE-ACETAMINOPHEN 5-325 MG PO TABS
1.0000 | ORAL_TABLET | Freq: Four times a day (QID) | ORAL | Status: DC | PRN
  Administered 2014-02-01 – 2014-02-04 (×8): 1 via ORAL
  Filled 2014-02-01 (×8): qty 1

## 2014-02-01 MED ORDER — PREDNISONE 10 MG PO TABS
60.0000 mg | ORAL_TABLET | Freq: Every day | ORAL | Status: DC
  Administered 2014-02-02 – 2014-02-05 (×4): 60 mg via ORAL
  Filled 2014-02-01 (×5): qty 1

## 2014-02-01 MED ORDER — ASPIRIN EC 81 MG PO TBEC
81.0000 mg | DELAYED_RELEASE_TABLET | Freq: Every day | ORAL | Status: DC
  Administered 2014-02-01 – 2014-02-05 (×5): 81 mg via ORAL
  Filled 2014-02-01 (×5): qty 1

## 2014-02-01 MED ORDER — PROMETHAZINE HCL 25 MG/ML IJ SOLN
25.0000 mg | Freq: Once | INTRAMUSCULAR | Status: AC
  Administered 2014-02-01: 25 mg via INTRAVENOUS
  Filled 2014-02-01: qty 1

## 2014-02-01 MED ORDER — HYDROCODONE-ACETAMINOPHEN 5-325 MG PO TABS
1.0000 | ORAL_TABLET | Freq: Four times a day (QID) | ORAL | Status: DC | PRN
  Administered 2014-02-01: 2 via ORAL
  Filled 2014-02-01: qty 2

## 2014-02-01 MED ORDER — HYDRALAZINE HCL 20 MG/ML IJ SOLN
5.0000 mg | Freq: Once | INTRAMUSCULAR | Status: AC
  Administered 2014-02-01: 5 mg via INTRAVENOUS
  Filled 2014-02-01: qty 1

## 2014-02-01 MED ORDER — HYDRALAZINE HCL 20 MG/ML IJ SOLN
10.0000 mg | Freq: Once | INTRAMUSCULAR | Status: AC
  Administered 2014-02-01: 10 mg via INTRAVENOUS
  Filled 2014-02-01: qty 1

## 2014-02-01 MED ORDER — LEVETIRACETAM 500 MG PO TABS
1000.0000 mg | ORAL_TABLET | Freq: Every day | ORAL | Status: DC
  Administered 2014-02-01 – 2014-02-05 (×5): 1000 mg via ORAL
  Filled 2014-02-01 (×5): qty 2

## 2014-02-01 MED ORDER — NEPRO/CARBSTEADY PO LIQD: 237.0000 mL | Freq: Three times a day (TID) | ORAL | Status: DC

## 2014-02-01 MED ORDER — HYDROMORPHONE HCL 1 MG/ML IJ SOLN
1.0000 mg | Freq: Once | INTRAMUSCULAR | Status: AC
  Administered 2014-02-01: 1 mg via INTRAVENOUS
  Filled 2014-02-01: qty 1

## 2014-02-01 MED ORDER — SODIUM CHLORIDE 0.9 % IJ SOLN: 3.0000 mL | INTRAMUSCULAR | Status: DC | PRN

## 2014-02-01 MED ORDER — ONDANSETRON HCL 4 MG PO TABS
4.0000 mg | ORAL_TABLET | Freq: Four times a day (QID) | ORAL | Status: DC | PRN
Start: 2014-02-01 — End: 2014-02-05

## 2014-02-01 MED ORDER — CALCIUM ACETATE 667 MG PO CAPS
667.0000 mg | ORAL_CAPSULE | ORAL | Status: DC | PRN
  Filled 2014-02-01: qty 1

## 2014-02-01 MED ORDER — HYDRALAZINE HCL 25 MG PO TABS
25.0000 mg | ORAL_TABLET | Freq: Three times a day (TID) | ORAL | Status: DC
  Administered 2014-02-01 – 2014-02-03 (×4): 25 mg via ORAL
  Filled 2014-02-01 (×9): qty 1

## 2014-02-01 MED ORDER — ONDANSETRON HCL 4 MG/2ML IJ SOLN: 4.0000 mg | Freq: Four times a day (QID) | INTRAMUSCULAR | Status: DC | PRN

## 2014-02-01 MED ORDER — SODIUM CHLORIDE 0.9 % IJ SOLN
3.0000 mL | Freq: Two times a day (BID) | INTRAMUSCULAR | Status: DC
  Administered 2014-02-01 – 2014-02-05 (×7): 3 mL via INTRAVENOUS

## 2014-02-01 MED ORDER — SODIUM CHLORIDE 0.9 % IJ SOLN
3.0000 mL | Freq: Two times a day (BID) | INTRAMUSCULAR | Status: DC
  Administered 2014-02-01 – 2014-02-04 (×2): 3 mL via INTRAVENOUS

## 2014-02-01 MED ORDER — CALCIUM ACETATE 667 MG PO CAPS
1334.0000 mg | ORAL_CAPSULE | Freq: Three times a day (TID) | ORAL | Status: DC
  Administered 2014-02-03 – 2014-02-05 (×7): 1334 mg via ORAL
  Filled 2014-02-01 (×14): qty 2

## 2014-02-01 MED ORDER — LABETALOL HCL 5 MG/ML IV SOLN
10.0000 mg | Freq: Once | INTRAVENOUS | Status: AC
  Administered 2014-02-01: 10 mg via INTRAVENOUS
  Filled 2014-02-01: qty 4

## 2014-02-01 NOTE — Progress Notes (Signed)
UR completed 

## 2014-02-01 NOTE — ED Notes (Signed)
Lab results given to Dr.Yao. 

## 2014-02-01 NOTE — Progress Notes (Signed)
INITIAL NUTRITION ASSESSMENT  Pt meets criteria for SEVERE MALNUTRITION in the context of chronic illness as evidenced by severe fat and muscle mass loss.  DOCUMENTATION CODES Per approved criteria  -Severe malnutrition in the context of chronic illness   INTERVENTION: Once diet advances, recommend providing Resource Breeze po TID, each supplement provides 250 kcal and 9 grams of protein.  Will order nourishment snack ([place] sandwich) once diet advances.  NUTRITION DIAGNOSIS: Malnutrition related to chronic illness as evidenced by severe fat and muscle mass loss.   Goal: Pt to meet >/= 90% of their estimated nutrition needs   Monitor:  Diet advancement, weight trends, labs, I/O's  Reason for Assessment: MST  58 y.o. male  Admitting Dx: Hypertensive urgency  ASSESSMENT: Pt with PMH of ESRD on HD T/Th/Sat, HTN, substance abuse (cocaine), and DM2, who was discharged from the [facility] on 01/29/14 after hospitalization and treatment for left optic neuritis who presents to the ED with worsening left eye pain and left sided headache.   Pt is familiar to this RD. Pt reports having a good appetite currently and PTA. Pt reports he has been eating well at home with consumption of 3 full meals a day. Pt with no recent weight loss. Pt is currently NPO. Pt reports he does not like Nep[employer], but likes [employer]. Will order [employer] once diet advances. Pt also reports he would like a nourishment snack ([place] sandwich) before bed. Will order snack for pt once diet advances.   Nutrition Focused Physical Exam:   Subcutaneous Fat:  Orbital Region: N/A  Upper Arm Region: Moderate depletion  Thoracic and Lumbar Region: Severe depletion   Muscle:  Temple Region: Severe depletion  Clavicle Bone Region: Severe depletion  Clavicle and Acromion Bone Region: Severe depletion  Scapular Bone Region: N/A  Dorsal Hand: Moderate depletion  Patellar Region: Severe depletion  Anterior Thigh  Region: Severe depletion  Posterior Calf Region: Severe depletion   Edema: none  Labs: Low sodium, and calcium. High BUN and creatinine.  Height: Ht Readings from Last 1 Encounters:  02/01/14 5\' 9"  (1.753 m)    Weight: Wt Readings from Last 1 Encounters:  02/01/14 138 lb (62.596 kg)    Ideal Body Weight: 160 lbs  % Ideal Body Weight: 86%  Wt Readings from Last 10 Encounters:  02/01/14 138 lb (62.596 kg)  01/29/14 132 lb 0.9 oz (59.9 kg)  11/09/13 127 lb 6.4 oz (57.788 kg)  11/07/13 131 lb 9.6 oz (59.693 kg)  08/31/13 129 lb 8 oz (58.741 kg)  08/26/13 132 lb 15 oz (60.3 kg)  08/26/13 132 lb 15 oz (60.3 kg)  08/01/13 128 lb 6.4 oz (58.242 kg)  07/25/13 140 lb (63.504 kg)  07/07/13 135 lb (61.236 kg)    Usual Body Weight: 140 lbs  % Usual Body Weight: 99%  BMI:  Body mass index is 20.37 kg/(m^2).  Estimated Nutritional Needs: Kcal: 1850 - 2100  Protein: at least 87 g daily  Fluid: 1.2 liters  Skin: intact  Diet Order: NPO  EDUCATION NEEDS: -No education needs identified at this time  No intake or output data in the 24 hours ending 02/01/14 1326  Last BM: PTA  Labs:   Recent Labs Lab 01/26/14 0750 01/29/14 0705 02/01/14 0749 02/01/14 0758  NA 132* 131* 137 133*  K 3.9 4.5 4.7 4.3  CL 92* 90* 94* 105  CO2 25 23 24   --   BUN 47* 81* 64* 58*  CREATININE 8.12* 9.60* 7.28* 7.40*  CALCIUM 9.2 9.2 9.1  --   PHOS 4.0 2.7  --   --   GLUCOSE 95 142* 153* 154*    CBG (last 3)  No results found for this basename: GLUCAP,  in the last 72 hours  Scheduled Meds: . aspirin EC  81 mg Oral Daily  . calcium acetate  1,334 mg Oral TID WC  . feeding supplement (NEPRO CARB STEADY)  237 mL Oral TID WC  . heparin  5,000 Units Subcutaneous 3 times per day  . hydrALAZINE  25 mg Oral 3 times per day  . levETIRAcetam  1,000 mg Oral Daily  . [START ON 02/02/2014] predniSONE  60 mg Oral Q breakfast  . sodium chloride  3 mL Intravenous Q12H  . sodium chloride  3  mL Intravenous Q12H    Continuous Infusions:   Past Medical History  Diagnosis Date  . Hypertension   . Anemia, chronic disease   . CHF (congestive heart failure)     diastolic.  EF 60 - 65% per [facility] eco 11/2011  . Cocaine abuse     mentioned in notes from [place]  . Hepatitis C antibody test positive     was HIV negative, 02/28/12  . Hepatitis B core antibody positive     03/01/10  . Positive QuantiFERON-TB Gold test     11/2011  . Helicobacter pylori gastritis     not defined if this was treated  . Polyp of colon, adenomatous     May 2012.  Dr Trenton Founds in [place]  . Shortness of breath   . Hematochezia   . Head injury, closed, with concussion   . History of blood transfusion     "last one was 2 days ago" (11/09/2012)  . Type II diabetes mellitus     on oral pills only  . Arthritis     "right shoulder" (11/09/2012)  . Seizure disorder     questionable history of - will need to clarify with PCP  . Headache(784.0)     "q other day" (11/09/2012)  . ESRD on hemodialysis since 2012    Patient says he started HD in 2012.  ESRD was due to "drugs", primarily used cocaine.  Has 3-5 year hx of HTN, no DM.  Gets HD on TTS schedule at [place].  [name] from [address] and recently moved here.    . Hypercalcemia   . Hyperpotassemia   . Seizures     Past Surgical History  Procedure Laterality Date  . Shoulder open rotator cuff repair Right   . Total knee arthroplasty Left   . Bascilic vein transposition  03/07/2012    Procedure: BASCILIC VEIN TRANSPOSITION;  Surgeon: Conrad [place], MD;  Location: [place];  Service: Vascular;  Laterality: Left;  First Stage  . Insertion of dialysis catheter      right chest  . Bascilic vein transposition Left 05/31/2012    Procedure: BASCILIC VEIN TRANSPOSITION;  Surgeon: Conrad [place], MD;  Location: [place];  Service: Vascular;  Laterality: Left;  Left 2nd Stage Basilic Vein Transposition with gortex graft revision using 57mmx10cm graft  .  Shoulder open rotator cuff repair Right   . [name] capsule study N/A 08/27/2013    Procedure: [name] CAPSULE STUDY;  Surgeon: Milus Banister, MD;  Location: [place];  Service: Endoscopy;  Laterality: N/A;    Kallie Locks, MS, RD, LDN Pager # [phone] After hours/ weekend pager # [phone]

## 2014-02-01 NOTE — Consult Note (Signed)
[facility] for Infectious Disease     Reason for Consult: optic neuritis    Referring Physician: Dr. Francoise Ceo  Principal Problem:   Hypertensive urgency Active Problems:   Diabetes mellitus without complication   ESRD on hemodialysis   Seizure disorder   Cocaine abuse   Optic neuritis, left   Protein-calorie malnutrition, severe   Orthostatic hypotension   Accelerated hypertension   . aspirin EC  81 mg Oral Daily  . calcium acetate  1,334 mg Oral TID WC  . feeding supplement (NEPRO CARB STEADY)  237 mL Oral TID WC  . heparin  5,000 Units Subcutaneous 3 times per day  . hydrALAZINE  25 mg Oral 3 times per day  . levETIRAcetam  1,000 mg Oral Daily  . [START ON 02/02/2014] predniSONE  60 mg Oral Q breakfast  . sodium chloride  3 mL Intravenous Q12H  . sodium chloride  3 mL Intravenous Q12H    Recommendations: Continue steroids per opthalmology   Please call with questions, thanks  Assessment: He was diagnosed with non infectious optic neuritis by Dr. Posey Pronto of ophthalmology and started on steroids with a taper to be determined later.  Presented with worsening headache in the setting of hypertensive urgency, improved with treatment.  ESR 16.  RPR nr.    Antibiotics: none  HPI: [name] is a 58 y.o. male with ESRD, drug abuse, diabetes, recently hospitalized with optic neuritis.  Initially presented with left sided headache and started on steroids.  Was improved but headache worsened and sent in for evaluation by SNF.  Treated in ED with labetolol, hydralazine and headache improved.  Initial BP 226/85.     Review of Systems: Review of systems not obtained due to patient factors.  Past Medical History  Diagnosis Date  . Hypertension   . Anemia, chronic disease   . CHF (congestive heart failure)     diastolic.  EF 60 - 65% per [facility] eco 11/2011  . Cocaine abuse     mentioned in notes from [place]  . Hepatitis C antibody test positive     was HIV  negative, 02/28/12  . Hepatitis B core antibody positive     03/01/10  . Positive QuantiFERON-TB Gold test     11/2011  . Helicobacter pylori gastritis     not defined if this was treated  . Polyp of colon, adenomatous     May 2012.  Dr Trenton Founds in [place]  . Shortness of breath   . Hematochezia   . Head injury, closed, with concussion   . History of blood transfusion     "last one was 2 days ago" (11/09/2012)  . Type II diabetes mellitus     on oral pills only  . Arthritis     "right shoulder" (11/09/2012)  . Seizure disorder     questionable history of - will need to clarify with PCP  . Headache(784.0)     "q other day" (11/09/2012)  . ESRD on hemodialysis since 2012    Patient says he started HD in 2012.  ESRD was due to "drugs", primarily used cocaine.  Has 3-5 year hx of HTN, no DM.  Gets HD on TTS schedule at [facility].  [name] from [place] and recently moved here.    . Hypercalcemia   . Hyperpotassemia   . Seizures     History  Substance Use Topics  . Smoking status: Current Every Day Smoker -- 0.25 packs/day for 25  years    Types: Cigarettes  . Smokeless tobacco: Never Used  . Alcohol Use: No     Comment: 11/09/2012 "been stopped drinking 1-2 yr ago"    Family History  Problem Relation Age of Onset  . Diabetes Mother   . Hypertension Mother   . Stroke Mother   . Kidney failure Mother   . Cancer Father    Allergies  Allergen Reactions  . Reglan [Metoclopramide] Other (See Comments)    Tardive dyskinesia in 11/2011 in [place]: Blood pressure 183/77, pulse 78, temperature 98 F (36.7 C), temperature source Oral, resp. rate 12, height 5' 9"  (1.753 m), weight 138 lb (62.596 kg), SpO2 93.00%. General: awake, not answering questions Skin: no rashes Lungs: CTA B Cor: tachy RR   Microbiology: No results found for this or any previous visit (from the past 240 hour(s)).  Scharlene Gloss, [facility] for Infectious Disease [place] [email or web address] [phone] pager  [phone] cell 02/01/2014, 2:04 PM

## 2014-02-01 NOTE — ED Notes (Signed)
Apresoline ordered from pharm.

## 2014-02-01 NOTE — ED Provider Notes (Addendum)
CSN: [id number]     Arrival date & time 02/01/14  0716 History   First MD Initiated Contact with Patient 02/01/14 [phone]     Chief Complaint  Patient presents with  . Headache  . Hypertension     (Consider location/radiation/quality/duration/timing/severity/associated sxs/prior Treatment) The history is provided by the patient.  Waylen Depaolo is a 58 y.o. male hx of HTN, CHF, ESRD on HD (last dialysis was yesterday), DM, here with headache, L eye pain and hypertension. Patient has been having headaches for the last week. Came in a week ago and diagnosed with optic neuritic and has been on steroids. He went home several days ago and headache woke up him around midnight. It has been constant since then. Diffuse weakness as well. Still has left eye pain and double vision but unchanged since discharge. Patient denies fevers or chest pain or abdominal pain or shortness of breath. Denies vomiting.    Past Medical History  Diagnosis Date  . Hypertension   . Anemia, chronic disease   . CHF (congestive heart failure)     diastolic.  EF 60 - 65% per [facility] eco 11/2011  . Cocaine abuse     mentioned in notes from [place]  . Hepatitis C antibody test positive     was HIV negative, 02/28/12  . Hepatitis B core antibody positive     03/01/10  . Positive QuantiFERON-TB Gold test     11/2011  . Helicobacter pylori gastritis     not defined if this was treated  . Polyp of colon, adenomatous     May 2012.  Dr Trenton Founds in [place]  . Shortness of breath   . Hematochezia   . Head injury, closed, with concussion   . History of blood transfusion     "last one was 2 days ago" (11/09/2012)  . Type II diabetes mellitus     on oral pills only  . Arthritis     "right shoulder" (11/09/2012)  . Seizure disorder     questionable history of - will need to clarify with PCP  . Headache(784.0)     "q other day" (11/09/2012)  . ESRD on hemodialysis since 2012    Patient says he started HD in 2012.  ESRD  was due to "drugs", primarily used cocaine.  Has 3-5 year hx of HTN, no DM.  Gets HD on TTS schedule at [facility].  [name] from [place] and recently moved here.    . Hypercalcemia   . Hyperpotassemia   . Seizures    Past Surgical History  Procedure Laterality Date  . Shoulder open rotator cuff repair Right   . Total knee arthroplasty Left   . Bascilic vein transposition  03/07/2012    Procedure: BASCILIC VEIN TRANSPOSITION;  Surgeon: Conrad [place], MD;  Location: [facility];  Service: Vascular;  Laterality: Left;  First Stage  . Insertion of dialysis catheter      right chest  . Bascilic vein transposition Left 05/31/2012    Procedure: BASCILIC VEIN TRANSPOSITION;  Surgeon: Conrad [place], MD;  Location: [place] [facility];  Service: Vascular;  Laterality: Left;  Left 2nd Stage Basilic Vein Transposition with gortex graft revision using 54mx10cm graft  . Shoulder open rotator cuff repair Right   . [name] capsule study N/A 08/27/2013    Procedure: [name] CAPSULE STUDY;  Surgeon: DMilus Banister MD;  [place]  Service: Endoscopy;  Laterality: N/A;   Family History  Problem Relation Age of  Onset  . Diabetes Mother   . Hypertension Mother   . Stroke Mother   . Kidney failure Mother   . Cancer Father    History  Substance Use Topics  . Smoking status: Current Every Day Smoker -- 0.25 packs/day for 25 years    Types: Cigarettes  . Smokeless tobacco: Never Used  . Alcohol Use: No     Comment: 11/09/2012 "been stopped drinking 1-2 yr ago"    Review of Systems  Neurological: Positive for weakness and headaches.  All other systems reviewed and are negative.     Allergies  Reglan  Home Medications   Prior to Admission medications   Medication Sig Start Date End Date Taking? Authorizing Provider  calcium acetate (PHOSLO) 667 MG capsule Take 667-1,334 mg by mouth See admin instructions. Take 1334 mg by mouth three times a day with meals and take 632m a day with snacks   Yes  Historical Provider, MD  feeding supplement, RESOURCE BREEZE, (RESOURCE BREEZE) LIQD Take 1 Container by mouth 3 (three) times daily between meals. 01/29/14  Yes AFrancesca Oman DO  HYDROcodone-acetaminophen (NORCO/VICODIN) 5-325 MG per tablet Take 1-2 tablets by mouth every 6 (six) hours as needed for moderate pain. 01/29/14  Yes Alex MRonnie Derby DO  levETIRAcetam (KEPPRA) 1000 MG tablet Take 1,000 mg by mouth daily.    Yes Historical Provider, MD  predniSONE (DELTASONE) 20 MG tablet Take 3 tablets (60 mg total) by mouth daily with breakfast. 01/29/14  Yes AFrancesca Oman DO  TUBERCULIN PPD ID Inject 0.1 mLs into the skin daily. For 10 days, to be given on evening  shift   Yes Historical Provider, MD   BP 226/85  Pulse 87  Temp(Src) 98 F (36.7 C) (Oral)  Resp 16  Ht 5' 9"  (1.753 m)  Wt 138 lb (62.596 kg)  BMI 20.37 kg/m2  SpO2 97% Physical Exam  Nursing note and vitals reviewed. Constitutional: [name] oriented to person, place, and time.  Chronically ill, uncomfortable   HENT:  Head: Normocephalic.  Mouth/Throat: Oropharynx is clear and moist.  Eyes:  [name] swollen, pupils reactive, EOMI   Neck: Normal range of motion. Neck supple.  Cardiovascular: Normal rate, regular rhythm and normal heart sounds.   Pulmonary/Chest: Effort normal.  + bibasilar crackles   Abdominal: Soft. Bowel sounds are normal. He exhibits no distension. There is no tenderness. There is no rebound and no guarding.  Musculoskeletal: Normal range of motion.  1+ edema bilaterally   Neurological: [name] alert and oriented to person, place, and time.  Skin: Skin is warm and dry.  Psychiatric: He has a normal mood and affect. His behavior is normal. Judgment and thought content normal.    ED Course  Procedures (including critical care time) Labs Review Labs Reviewed  CBC WITH DIFFERENTIAL - Abnormal; Notable for the following:    RDW 15.6 (*)    All other components within normal limits  COMPREHENSIVE  METABOLIC PANEL - Abnormal; Notable for the following:    Chloride 94 (*)    Glucose, Bld 153 (*)    BUN 64 (*)    Creatinine, Ser 7.28 (*)    GFR calc non Af Amer 7 (*)    GFR calc Af Amer 9 (*)    Anion gap 19 (*)    All other components within normal limits  I-STAT TROPOININ, ED - Abnormal; Notable for the following:    Troponin i, poc 0.13 (*)    All  other components within normal limits  SEDIMENTATION RATE  TROPONIN I  I-STAT CHEM 8, ED    Imaging Review Dg Chest 1 View  02/01/2014   CLINICAL DATA:  Headache.  EXAM: CHEST - 1 VIEW  COMPARISON:  01/19/2014  FINDINGS: Heart is upper limits normal in size. Lungs are clear. No effusions or acute bony abnormality.  IMPRESSION: No active disease.   Electronically Signed   By: Rolm Baptise M.D.   On: 02/01/2014 08:55   Ct Head Wo Contrast  02/01/2014   CLINICAL DATA:  Diffuse headache, hypertension, and left eye swelling. Prior stroke with residual left-sided weakness.  EXAM: CT HEAD WITHOUT CONTRAST  CT MAXILLOFACIAL WITHOUT CONTRAST  TECHNIQUE: Multidetector CT imaging of the head and maxillofacial structures were performed using the standard protocol without intravenous contrast. Multiplanar CT image reconstructions of the maxillofacial structures were also generated.  COMPARISON:  Brain MRI 01/21/2014. Head CT 01/19/2014. Maxillofacial CT 01/20/2014.  FINDINGS: CT HEAD FINDINGS  There is no evidence of acute cortical infarct, intracranial hemorrhage, mass, midline shift, or extra-axial fluid collection. Ventricles and sulci are within normal limits for age. Patchy and confluent periventricular and subcortical white matter hypodensities are similar to the prior studies and nonspecific but compatible with moderate chronic small vessel ischemic disease. Mastoid air cells and visualized paranasal sinuses are clear. Moderate carotid siphon calcification is noted. No skull fracture is seen.  CT MAXILLOFACIAL FINDINGS  The globes appear intact.  Mild left proptosis is not excluded. The extraocular muscles and lacrimal glands are unremarkable. No significant inflammatory changes are identified in the orbital fat. No significant preseptal inflammatory change/ soft tissue thickening is identified. There is no orbital mass. No fluid collection is identified. There is trace left maxillary sinus mucosal thickening. There is poor dentition with multiple periapical lucencies, most prominently involving the left maxillary second molar.  IMPRESSION: 1. No evidence of acute intracranial abnormality. 2. Moderate chronic small vessel ischemic disease. 3. No significant inflammatory changes involving the orbits. 4. Periodontal disease.   Electronically Signed   By: Logan Bores   On: 02/01/2014 09:07   Ct Maxillofacial Wo Cm  02/01/2014   CLINICAL DATA:  Diffuse headache, hypertension, and left eye swelling. Prior stroke with residual left-sided weakness.  EXAM: CT HEAD WITHOUT CONTRAST  CT MAXILLOFACIAL WITHOUT CONTRAST  TECHNIQUE: Multidetector CT imaging of the head and maxillofacial structures were performed using the standard protocol without intravenous contrast. Multiplanar CT image reconstructions of the maxillofacial structures were also generated.  COMPARISON:  Brain MRI 01/21/2014. Head CT 01/19/2014. Maxillofacial CT 01/20/2014.  FINDINGS: CT HEAD FINDINGS  There is no evidence of acute cortical infarct, intracranial hemorrhage, mass, midline shift, or extra-axial fluid collection. Ventricles and sulci are within normal limits for age. Patchy and confluent periventricular and subcortical white matter hypodensities are similar to the prior studies and nonspecific but compatible with moderate chronic small vessel ischemic disease. Mastoid air cells and visualized paranasal sinuses are clear. Moderate carotid siphon calcification is noted. No skull fracture is seen.  CT MAXILLOFACIAL FINDINGS  The globes appear intact. Mild left proptosis is not excluded.  The extraocular muscles and lacrimal glands are unremarkable. No significant inflammatory changes are identified in the orbital fat. No significant preseptal inflammatory change/ soft tissue thickening is identified. There is no orbital mass. No fluid collection is identified. There is trace left maxillary sinus mucosal thickening. There is poor dentition with multiple periapical lucencies, most prominently involving the left maxillary second molar.  IMPRESSION: 1. No  evidence of acute intracranial abnormality. 2. Moderate chronic small vessel ischemic disease. 3. No significant inflammatory changes involving the orbits. 4. Periodontal disease.   Electronically Signed   By: Logan Bores   On: 02/01/2014 09:07     EKG Interpretation   Date/Time:  Friday February 01 2014 07:26:44 EDT Ventricular Rate:  80 PR Interval:  189 QRS Duration: 102 QT Interval:  416 QTC Calculation: 480 R Axis:   41 Text Interpretation:  Sinus rhythm LAE, consider biatrial enlargement Left  ventricular hypertrophy Anterior infarct, acute (LAD) Has LBBB in the  past,  TW changes since previous  Confirmed by YAO  MD, DAVID ([id number]) on  02/01/2014 7:30:17 AM      MDM   Final diagnoses:  Headache  Hypertension    Saksham Akkerman is a 58 y.o. male here with headache, diffuse weakness, eye pain. I think its worsening of symptoms since discharge. Also may have hypertensive urgency. No chest pain but there are EKG changes. I called Dr. Martinique from cardiology, who feels that its not a STEMI and doesn't need to activate STEMI protocol. Will do CT head, labs, CXR, repeat ESR. Will give IV antihypertensives.   9:20 AM BP improved with IV hydralazine and IV labetalol. CT head/face unchanged. Headache improved with phenergan. I called Dr. Linus Salmons from [place], who will see patient. Recommend current dose of prednisone for now. Will admit for hypertensive urgency. Istat trop positive but lab trop neg and no chest pain. Will admit to tele.      Wandra Arthurs, MD 02/01/14 [place], MD 02/01/14 [phone]

## 2014-02-01 NOTE — ED Notes (Signed)
[facility]- Pt is coming in from [facility] with a headache that began at approximately midnight last night. He rates the pain as 9/10. His left eye is swollen shut from what is believed to be an infection. [name] a dialysis pt, last received treatment yesterday. Hypertensive with EMS with an initial reading of 242/104, last reading was 230/100, manually. Pt is A&OX4.

## 2014-02-01 NOTE — ED Notes (Signed)
PT placed on 2L [place] d/t desaturation after IV dilaudid.  Sats of 94 on 2L.

## 2014-02-01 NOTE — ED Notes (Signed)
Informed patient that we need a urine sample, I asked the patient if he still makes urine and he stated "just a little bit but I can't go right now."

## 2014-02-01 NOTE — H&P (Signed)
Internal Medicine Attending Admission Note Date: 02/01/2014  Patient name: [name] Medical record number: [id number] Date of birth: July 18, 1955 Age: 58 y.o. Gender: male  I saw and evaluated the patient. I reviewed the resident's note and I agree with the resident's findings and plan as documented in the resident's note.  Chief Complaint(s): Headache and left eye pain  History - key components related to admission:  [name] is a 58 year old man who was recently discharged from [facility] after being admitted, diagnosed, and treated for a left optic perineuritis. Of note, his past medical history is significant for end-stage renal disease requiring hemodialysis, substance abuse, particularly cocaine, and hypertension. He has required epo therapy and states his last cocaine use was one month ago. His antihypertensives were discontinued upon the recent discharge secondary to orthostatic hypotension. Although his left eye pain and headache were improving after discharge they suddenly worsened around midnight prior to admission. He therefore represented to the emergency department for further evaluation and care. He states he's been compliant with the prednisone therapy prescribed at discharge.  In the emergency department he was noted to have a blood pressure of 243/91. A point-of-care troponin was positive at 0.13 and his EKG had changes from the prior tracing. Cardiology was called and felt the EKG changes were likely related to his significant hypertension rather than to acute myocardial ischemia. CT of the head did not reveal any acute changes from a recent CT during the previous admission. He was given IV antihypertensives and IV dilaudid for his blood pressure and pain. He was readmitted to the internal medicine teaching service for pain management as well as treatment of his hypertensive urgency.  This evening he notes continued pain in the head and in the eye. The IV Dilaudid was  helpful as was the oral Narco although he states the Narco was not as effective as the IV dilaudid. He denies any chest pain, abdominal pain, or shortness of breath. He again reiterated that he has not had cocaine in the past one month. As he makes very little urine obtaining a urine drug screen has been challenging and is pending at this time.  Physical Exam - key components related to admission:  Filed Vitals:   02/01/14 1702 02/01/14 1705 02/01/14 1743 02/01/14 1854  BP: 215/85 245/90 210/98 175/73  Pulse:    79  Temp:    98.1 F (36.7 C)  TempSrc:      Resp:      Height:      Weight:      SpO2:       General: Well-developed, well-nourished, man lying comfortably in bed in no acute distress. Lungs: Clear to auscultation anteriorly in both lungs without wheezes, rhonchi, or rales. Heart: Regular rate and rhythm with a 2/6 systolic murmur best heard at the left apex. Abdomen, soft, nontender, active bowel sounds. Extremities: Without edema.  Lab results:  Basic Metabolic Panel:  Recent Labs  02/01/14 0749 02/01/14 0758  NA 137 133*  K 4.7 4.3  CL 94* 105  CO2 24  --   GLUCOSE 153* 154*  BUN 64* 58*  CREATININE 7.28* 7.40*  CALCIUM 9.1  --    Liver Function Tests:  Recent Labs  02/01/14 0749  AST 36  ALT 48  ALKPHOS 61  BILITOT 0.3  PROT 8.2  ALBUMIN 3.5   CBC:  Recent Labs  02/01/14 0749 02/01/14 0758  WBC 9.9  --   NEUTROABS 7.5  --  HGB 13.6 15.3  HCT 40.4 45.0  MCV 87.4  --   PLT 214  --    Cardiac Enzymes:  Recent Labs  02/01/14 0749 02/01/14 1405 02/01/14 1852  TROPONINI <0.30 <0.30 <0.30   Urine Drug Screen:  Pending obtaining urine  Misc. Labs:  ESR 16  Imaging results:  Dg Chest 1 View  02/01/2014   CLINICAL DATA:  Headache.  EXAM: CHEST - 1 VIEW  COMPARISON:  01/19/2014  FINDINGS: Heart is upper limits normal in size. Lungs are clear. No effusions or acute bony abnormality.  IMPRESSION: No active disease.   Electronically  Signed   By: Rolm Baptise M.D.   On: 02/01/2014 08:55   Ct Head Wo Contrast  02/01/2014   CLINICAL DATA:  Diffuse headache, hypertension, and left eye swelling. Prior stroke with residual left-sided weakness.  EXAM: CT HEAD WITHOUT CONTRAST  CT MAXILLOFACIAL WITHOUT CONTRAST  TECHNIQUE: Multidetector CT imaging of the head and maxillofacial structures were performed using the standard protocol without intravenous contrast. Multiplanar CT image reconstructions of the maxillofacial structures were also generated.  COMPARISON:  Brain MRI 01/21/2014. Head CT 01/19/2014. Maxillofacial CT 01/20/2014.  FINDINGS: CT HEAD FINDINGS  There is no evidence of acute cortical infarct, intracranial hemorrhage, mass, midline shift, or extra-axial fluid collection. Ventricles and sulci are within normal limits for age. Patchy and confluent periventricular and subcortical white matter hypodensities are similar to the prior studies and nonspecific but compatible with moderate chronic small vessel ischemic disease. Mastoid air cells and visualized paranasal sinuses are clear. Moderate carotid siphon calcification is noted. No skull fracture is seen.  CT MAXILLOFACIAL FINDINGS  The globes appear intact. Mild left proptosis is not excluded. The extraocular muscles and lacrimal glands are unremarkable. No significant inflammatory changes are identified in the orbital fat. No significant preseptal inflammatory change/ soft tissue thickening is identified. There is no orbital mass. No fluid collection is identified. There is trace left maxillary sinus mucosal thickening. There is poor dentition with multiple periapical lucencies, most prominently involving the left maxillary second molar.  IMPRESSION: 1. No evidence of acute intracranial abnormality. 2. Moderate chronic small vessel ischemic disease. 3. No significant inflammatory changes involving the orbits. 4. Periodontal disease.   Electronically Signed   By: Logan Bores   On:  02/01/2014 09:07   Ct Maxillofacial Wo Cm  02/01/2014   CLINICAL DATA:  Diffuse headache, hypertension, and left eye swelling. Prior stroke with residual left-sided weakness.  EXAM: CT HEAD WITHOUT CONTRAST  CT MAXILLOFACIAL WITHOUT CONTRAST  TECHNIQUE: Multidetector CT imaging of the head and maxillofacial structures were performed using the standard protocol without intravenous contrast. Multiplanar CT image reconstructions of the maxillofacial structures were also generated.  COMPARISON:  Brain MRI 01/21/2014. Head CT 01/19/2014. Maxillofacial CT 01/20/2014.  FINDINGS: CT HEAD FINDINGS  There is no evidence of acute cortical infarct, intracranial hemorrhage, mass, midline shift, or extra-axial fluid collection. Ventricles and sulci are within normal limits for age. Patchy and confluent periventricular and subcortical white matter hypodensities are similar to the prior studies and nonspecific but compatible with moderate chronic small vessel ischemic disease. Mastoid air cells and visualized paranasal sinuses are clear. Moderate carotid siphon calcification is noted. No skull fracture is seen.  CT MAXILLOFACIAL FINDINGS  The globes appear intact. Mild left proptosis is not excluded. The extraocular muscles and lacrimal glands are unremarkable. No significant inflammatory changes are identified in the orbital fat. No significant preseptal inflammatory change/ soft tissue thickening is identified. There is  no orbital mass. No fluid collection is identified. There is trace left maxillary sinus mucosal thickening. There is poor dentition with multiple periapical lucencies, most prominently involving the left maxillary second molar.  IMPRESSION: 1. No evidence of acute intracranial abnormality. 2. Moderate chronic small vessel ischemic disease. 3. No significant inflammatory changes involving the orbits. 4. Periodontal disease.   Electronically Signed   By: Logan Bores   On: 02/01/2014 09:07   Other  results:  EKG: Normal sinus rhythm at 80 beats per minute, normal axis, normal intervals, left atrial enlargement, left ventricular hypertrophy by voltage, 2 mm ST elevation in V1 through V4 and inverted T wave in V6 in a strain pattern. The ST elevation in V1 through V4 is new and there is pseudonormalization of the inferolateral T waves with obvious lead placement differences in the precordium compared to the ECG from 01/19/2014.  Assessment & Plan by Problem:  [name] is a 58 year old man with end-stage renal disease, history of cocaine abuse, and hypertension, who was recently discharged on prednisone for a left optic perineuritis who presents with sudden worsening of left eye pain and headache associated with hypertensive urgency resulting in ECG changes. The cause of the hypertensive urgency is likely multifactorial including his prednisone use for the optic perineuritis, recent discontinuance of his antihypertensives secondary to previous orthostatic hypotension, and possible cocaine use although he denies it and we may not be able to confirm via a urine drug screen. He has ruled out for myocardial infarction with serial enzymes. [name] resting comfortably and we are achieving control of his blood pressure with IV medications. He will undergo hemodialysis tomorrow as scheduled which should also help with his blood pressure control. The epo is being held given his hemoglobin and this too should help to not exacerbate his hypertension.  1) Hypertensive urgency: We will continue to use IV antihypertensives overnight for any significantly elevated blood pressures. We will restart him on an oral regimen as he will require this long-term. [name] scheduled for hemodialysis tomorrow morning. At this point, other than the ECG changes, he has no evidence of any organ damage related to this acute event.  2) Left optic perineuritis: We are continuing the prednisone as recommended by ophthalmology.  3)  Headache and left eye pain: [name] being treated with oral narcotics. Should he have a spike in his pain he would be a candidate for IV narcotics. Of note, he did respond to the IV Dilaudid in the emergency room.  4) Disposition: I suspect once we obtain a decent oral antihypertensive regimen as well as reinitiate his hemodialysis and holds his epo he should be stable for discharge home within the next 24-72 hours.

## 2014-02-01 NOTE — Progress Notes (Signed)
Admission note:  Arrival Method: stretcher from ED Mental Orientation: A & O x 4 Telemetry: placed on telemetry box 22, CCMD notified Assessment: See Doc Flowsheets Skin: intact IV: right hand IV, saline locked Pain: patient states that he has a headache 8/10.  Awaiting pharmacy approval of medications to give PRN pain medication.  Patient resting and states that he can wait.   Tubes: none  Safety Measures: Educated patient on fall precautions, patient wearing non-slip socks [id number] Orientation: Patient has been oriented to the unit, staff and to the room.

## 2014-02-01 NOTE — Progress Notes (Signed)
Subjective:  Returned this morning with worsening headache and left eye pain, requests strong pain med; discharged 10/13 to [facility]- also inability to sleep and very high blood pressure  Objective: Vital signs in last 24 hours: Temp:  [98 F (36.7 C)] 98 F (36.7 C) (10/16 0728) Pulse Rate:  [75-89] 78 (10/16 1145) Resp:  [12-24] 12 (10/16 1145) BP: (166-247)/(70-101) 183/77 mmHg (10/16 1145) SpO2:  [90 %-99 %] 93 % (10/16 1145) Weight:  [62.596 kg (138 lb)] 62.596 kg (138 lb) (10/16 0728) Weight change:   Intake/Output from previous day:   Intake/Output this shift:   Lab Results:  Recent Labs  02/01/14 0749 02/01/14 0758  WBC 9.9  --   HGB 13.6 15.3  HCT 40.4 45.0  PLT 214  --    BMET:  Recent Labs  02/01/14 0749 02/01/14 0758  NA 137 133*  K 4.7 4.3  CL 94* 105  CO2 24  --   GLUCOSE 153* 154*  BUN 64* 58*  CREATININE 7.28* 7.40*  CALCIUM 9.1  --   ALBUMIN 3.5  --    No results found for this basename: PTH,  in the last 72 hours Iron Studies: No results found for this basename: IRON, TIBC, TRANSFERRIN, FERRITIN,  in the last 72 hours  Studies/Results: No results found.  Physical Exam:  General appearance:  Alert, in mild distress, [name] unable to open  Resp: CTA without rales, rhonchi, or wheezes Cardio: RRR no murmur or rub or gallop  GI: + BS, soft and nontender , nondistended  Extremities: No pedal edema  Access: AVF @ LUA with + bruit  Dialysis Orders: TTS @ [facility]  58.5 kg 4 hrs 2K/2.25Ca 400/A1.5 No Heparin AVF @ L[facility]  Hectorol 2Aranesp 80 mcg 9/29 and Venofer 50 mg on Thurs.   Assessment/Plan: 1. 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 2. ESRD - HD on TTS @ [facility] via aVF, K 4.3.  HD tomorrow. 3. HTN/volume - BP 183/77 on Hydralazine 25 mg q8h; wt 62.6 kg, 2.4 L over EDW post-HD yesterday. Needs more UF 4. Anemia - Hgb 15.3, Aranesp on hold, continue weekly Fe.   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  6. Nutrition - Alb 3.5, renal diet, multivitamin.  7. Hx Dm Type 2  8. Hepatitis C  9. Hx seizure disorder - on Keppra.  10. Hx cocaine abuse    LOS: 0 days   [name],Cletus 02/01/2014,4:12 PM  Patient seen and examined, agree with above note with above modifications. Patient just discharged earlier in the week after this bout with optic perineuritis.  Is back with worsening pain- malignant HTN- plan for HD in AM to assist with BP control and pain control per primary service [name], MD 02/01/2014

## 2014-02-01 NOTE — H&P (Signed)
Date: 02/01/2014               Patient Name:  [name] MRN: [id number]  DOB: 1955-05-24 Age / Sex: 58 y.o., male   PCP: Windy Kalata, MD         Medical Service: Internal Medicine Teaching Service         Attending Physician: Dr. Aldine Contes, MD    First Contact: Josiah Lobo    Pager: [phone]  Second Contact: Dr. Redmond Pulling Pager: [phone]       After Hours (After 5p/  First Contact Pager: [phone]  weekends / holidays): Second Contact Pager: [phone]   Chief Complaint: Headache, left eye pain  History of Present Illness:  [name] is a 58 year old man with PMH of ESRD on HD T/Th/Sat, HTN, substance abuse (cocaine), and DM2, who was discharged from the [facility] on 01/29/14 after hospitalization and treatment for left optic neuritis who presents to the ED with worsening left eye pain and left sided headache. He was discharged to the [facility] and underwent routine hemodialyses yesterday. He states that his eye pain and headache had improved but worsened about midnight prior to his presentation. He describes a 9/10 headache. His left eye continues to be shut with impairment of his EOM, just as seen prior to his discharge on Tuesday. He reports full compliance with prednisone which was prescribed to him at the time of his discharge with last dose yesteday. He was discharged with no antihypertensives due to symptomatic orthostatic hypotension.  In the ED his BP was elevated to 243/91. His poc troponin was elevated to 0.13 and his EKG had changes, ST elevation in the anterior leads concerning for LAD infarct. Cardiology, Dr. Martinique, was promptly consulted, and felt like the EKG changes were most likely consistent with the hypertensive state and did not recommend that a code STEMI be called. The patient remained chest pain free with no SOB, back pain, speech changes, or new focal Neurological changes. A CT head without contrast and a CT maxillofacial without contrast  revealed no No evidence of acute intracranial abnormality, no significant inflammatory changes involving the orbits, and periodontal disease. He was given labetalol 10mg  IV once with not much change in his BP. He was given hydralazine 10mg  IV x2 and his BP decreased to 188/71. He was also given Dilaudid 1mg  V for the headache with improvement of his eye pain and headache. The IMTS was called for his hospitalization for the treatment of his hypertensive urgency.    Meds: Current Facility-Administered Medications  Medication Dose Route Frequency Provider Last Rate Last Dose  . hydrALAZINE (APRESOLINE) tablet 25 mg  25 mg Oral 3 times per day Blain Pais, MD   25 mg at 02/01/14 1109   Current Outpatient Prescriptions  Medication Sig Dispense Refill  . calcium acetate (PHOSLO) 667 MG capsule Take 667-1,334 mg by mouth See admin instructions. Take 1334 mg by mouth three times a day with meals and take 667mg  a day with snacks      . feeding supplement, RESOURCE BREEZE, (RESOURCE BREEZE) LIQD Take 1 Container by mouth 3 (three) times daily between meals.  240 mL  0  . HYDROcodone-acetaminophen (NORCO/VICODIN) 5-325 MG per tablet Take 1-2 tablets by mouth every 6 (six) hours as needed for moderate pain.  30 tablet  0  . levETIRAcetam (KEPPRA) 1000 MG tablet Take 1,000 mg by mouth daily.       . predniSONE (DELTASONE) 20 MG  tablet Take 3 tablets (60 mg total) by mouth daily with breakfast.  90 tablet  0  . TUBERCULIN PPD ID Inject 0.1 mLs into the skin daily. For 10 days, to be given on evening  shift        Allergies: Allergies as of 02/01/2014 - Review Complete 02/01/2014  Allergen Reaction Noted  . Reglan [metoclopramide] Other (See Comments) 03/02/2012   Past Medical History  Diagnosis Date  . Hypertension   . Anemia, chronic disease   . CHF (congestive heart failure)     diastolic.  EF 60 - 65% per [facility] eco 11/2011  . Cocaine abuse     mentioned in notes from [place]  .  Hepatitis C antibody test positive     was HIV negative, 02/28/12  . Hepatitis B core antibody positive     03/01/10  . Positive QuantiFERON-TB Gold test     11/2011  . Helicobacter pylori gastritis     not defined if this was treated  . Polyp of colon, adenomatous     May 2012.  Dr Trenton Founds in [place]  . Shortness of breath   . Hematochezia   . Head injury, closed, with concussion   . History of blood transfusion     "last one was 2 days ago" (11/09/2012)  . Type II diabetes mellitus     on oral pills only  . Arthritis     "right shoulder" (11/09/2012)  . Seizure disorder     questionable history of - will need to clarify with PCP  . Headache(784.0)     "q other day" (11/09/2012)  . ESRD on hemodialysis since 2012    Patient says he started HD in 2012.  ESRD was due to "drugs", primarily used cocaine.  Has 3-5 year hx of HTN, no DM.  Gets HD on TTS schedule at [place].  [name] from [place] and recently moved here.    . Hypercalcemia   . Hyperpotassemia   . Seizures    Past Surgical History  Procedure Laterality Date  . Shoulder open rotator cuff repair Right   . Total knee arthroplasty Left   . Bascilic vein transposition  03/07/2012    Procedure: BASCILIC VEIN TRANSPOSITION;  Surgeon: Conrad Miamisburg, MD;  Location: [place];  Service: Vascular;  Laterality: Left;  First Stage  . Insertion of dialysis catheter      right chest  . Bascilic vein transposition Left 05/31/2012    Procedure: BASCILIC VEIN TRANSPOSITION;  Surgeon: Conrad Delton, MD;  Location: [place];  Service: Vascular;  Laterality: Left;  Left 2nd Stage Basilic Vein Transposition with gortex graft revision using 87mmx10cm graft  . Shoulder open rotator cuff repair Right   . [name] capsule study N/A 08/27/2013    Procedure: [name] CAPSULE STUDY;  Surgeon: Milus Banister, MD;  Location: [place];  Service: Endoscopy;  Laterality: N/A;   Family History  Problem Relation Age of Onset  . Diabetes Mother   .  Hypertension Mother   . Stroke Mother   . Kidney failure Mother   . Cancer Father    History   Social History  . Marital Status: Single    Spouse Name: N/A    Number of Children: 1  . Years of Education: N/A   Occupational History  . Unemployed    Social History Main Topics  . Smoking status: Current Every Day Smoker -- 0.25 packs/day for 25 years    Types:  Cigarettes  . Smokeless tobacco: Never Used  . Alcohol Use: No     Comment: 11/09/2012 "been stopped drinking 1-2 yr ago"  . Drug Use: Yes    Special: "Crack" cocaine, Marijuana     Comment: 11/09/2012 "used weed > 20 yr ago; smokes crack-cocaine- last time ~ 5 months ago"  . Sexual Activity: Yes    Birth Control/ Protection: None   Other Topics Concern  . Not on file   Social History Narrative  . No narrative on file    Review of Systems: Review of Systems  Constitutional: Positive for weight loss and malaise/fatigue. Negative for fever, chills and diaphoresis.  HENT: Negative for congestion, ear pain and hearing loss.   Eyes: Positive for double vision and pain. Negative for discharge.  Respiratory: Negative for cough, sputum production and shortness of breath.   Cardiovascular: Negative for chest pain, palpitations and leg swelling.  Gastrointestinal: Negative for nausea and vomiting.  Genitourinary: Negative for dysuria.  Musculoskeletal: Negative for myalgias.  Skin: Negative for itching and rash.  Neurological: Positive for headaches. Negative for dizziness, seizures and weakness.  Psychiatric/Behavioral: Negative for depression.    Physical Exam: Blood pressure 187/79, pulse 77, temperature 98 F (36.7 C), temperature source Oral, resp. rate 13, height 5\' 9"  (1.753 m), weight 138 lb (62.596 kg), SpO2 99.00%. Physical Exam  Nursing note and vitals reviewed. Constitutional: [name] oriented to person, place, and time. He appears well-developed. No distress.  Thin, tired appearing   HENT:  Several dental  caries with no noticeable abscess, pt report 13 teeth that need extraction  Eyes:  Complete ptosis of [name]; binocular diplopia, EOM in L eye continue to be limited and L abduction may be more grossly intact; L and R eye pupils are slowly reactive to light with left pupil more dilated ~24mm in comparison to right pupil of 30mm  Cardiovascular: Normal rate and regular rhythm.   Murmur heard. 3/6 murmur heard best at the LUSB  Respiratory: Effort normal and breath sounds normal. No respiratory distress. He has no wheezes.  Bibasilar crackles  GI: Soft. Bowel sounds are normal. He exhibits no distension. There is no tenderness. There is no rebound and no guarding.  Musculoskeletal: He exhibits no edema and no tenderness.  Neurological: [name] alert and oriented to person, place, and time.  Strength 5/5 in UE and LE with intact sensation  Skin: Skin is warm and dry. No rash noted. No erythema.  Skin dryness of bilateral lower extremities  Psychiatric: He has a normal mood and affect.     Lab results: Basic Metabolic Panel:  Recent Labs  02/01/14 0749 02/01/14 0758  NA 137 133*  K 4.7 4.3  CL 94* 105  CO2 24  --   GLUCOSE 153* 154*  BUN 64* 58*  CREATININE 7.28* 7.40*  CALCIUM 9.1  --    Liver Function Tests:  Recent Labs  02/01/14 0749  AST 36  ALT 48  ALKPHOS 61  BILITOT 0.3  PROT 8.2  ALBUMIN 3.5   CBC:  Recent Labs  02/01/14 0749 02/01/14 0758  WBC 9.9  --   NEUTROABS 7.5  --   HGB 13.6 15.3  HCT 40.4 45.0  MCV 87.4  --   PLT 214  --    Cardiac Enzymes:  Recent Labs  02/01/14 0749  TROPONINI <0.30   Urine Drug Screen: Drugs of Abuse     Component Value Date/Time   LABOPIA NONE DETECTED 08/24/2013 2115  COCAINSCRNUR POSITIVE* 08/24/2013 2115   LABBENZ NONE DETECTED 08/24/2013 2115   AMPHETMU NONE DETECTED 08/24/2013 2115   THCU NONE DETECTED 08/24/2013 2115   LABBARB NONE DETECTED 08/24/2013 2115     Imaging results:  Dg Chest 1 View  02/01/2014    CLINICAL DATA:  Headache.  EXAM: CHEST - 1 VIEW  COMPARISON:  01/19/2014  FINDINGS: Heart is upper limits normal in size. Lungs are clear. No effusions or acute bony abnormality.  IMPRESSION: No active disease.   Electronically Signed   By: Rolm Baptise M.D.   On: 02/01/2014 08:55   Ct Head Wo Contrast  02/01/2014   CLINICAL DATA:  Diffuse headache, hypertension, and left eye swelling. Prior stroke with residual left-sided weakness.  EXAM: CT HEAD WITHOUT CONTRAST  CT MAXILLOFACIAL WITHOUT CONTRAST  TECHNIQUE: Multidetector CT imaging of the head and maxillofacial structures were performed using the standard protocol without intravenous contrast. Multiplanar CT image reconstructions of the maxillofacial structures were also generated.  COMPARISON:  Brain MRI 01/21/2014. Head CT 01/19/2014. Maxillofacial CT 01/20/2014.  FINDINGS: CT HEAD FINDINGS  There is no evidence of acute cortical infarct, intracranial hemorrhage, mass, midline shift, or extra-axial fluid collection. Ventricles and sulci are within normal limits for age. Patchy and confluent periventricular and subcortical white matter hypodensities are similar to the prior studies and nonspecific but compatible with moderate chronic small vessel ischemic disease. Mastoid air cells and visualized paranasal sinuses are clear. Moderate carotid siphon calcification is noted. No skull fracture is seen.  CT MAXILLOFACIAL FINDINGS  The globes appear intact. Mild left proptosis is not excluded. The extraocular muscles and lacrimal glands are unremarkable. No significant inflammatory changes are identified in the orbital fat. No significant preseptal inflammatory change/ soft tissue thickening is identified. There is no orbital mass. No fluid collection is identified. There is trace left maxillary sinus mucosal thickening. There is poor dentition with multiple periapical lucencies, most prominently involving the left maxillary second molar.  IMPRESSION: 1. No  evidence of acute intracranial abnormality. 2. Moderate chronic small vessel ischemic disease. 3. No significant inflammatory changes involving the orbits. 4. Periodontal disease.   Electronically Signed   By: Logan Bores   On: 02/01/2014 09:07   Ct Maxillofacial Wo Cm  02/01/2014   CLINICAL DATA:  Diffuse headache, hypertension, and left eye swelling. Prior stroke with residual left-sided weakness.  EXAM: CT HEAD WITHOUT CONTRAST  CT MAXILLOFACIAL WITHOUT CONTRAST  TECHNIQUE: Multidetector CT imaging of the head and maxillofacial structures were performed using the standard protocol without intravenous contrast. Multiplanar CT image reconstructions of the maxillofacial structures were also generated.  COMPARISON:  Brain MRI 01/21/2014. Head CT 01/19/2014. Maxillofacial CT 01/20/2014.  FINDINGS: CT HEAD FINDINGS  There is no evidence of acute cortical infarct, intracranial hemorrhage, mass, midline shift, or extra-axial fluid collection. Ventricles and sulci are within normal limits for age. Patchy and confluent periventricular and subcortical white matter hypodensities are similar to the prior studies and nonspecific but compatible with moderate chronic small vessel ischemic disease. Mastoid air cells and visualized paranasal sinuses are clear. Moderate carotid siphon calcification is noted. No skull fracture is seen.  CT MAXILLOFACIAL FINDINGS  The globes appear intact. Mild left proptosis is not excluded. The extraocular muscles and lacrimal glands are unremarkable. No significant inflammatory changes are identified in the orbital fat. No significant preseptal inflammatory change/ soft tissue thickening is identified. There is no orbital mass. No fluid collection is identified. There is trace left maxillary sinus mucosal thickening. There is  poor dentition with multiple periapical lucencies, most prominently involving the left maxillary second molar.  IMPRESSION: 1. No evidence of acute intracranial  abnormality. 2. Moderate chronic small vessel ischemic disease. 3. No significant inflammatory changes involving the orbits. 4. Periodontal disease.   Electronically Signed   By: Logan Bores   On: 02/01/2014 09:07    Other results: EKG Interpretation  Date/Time: Friday February 01 2014 07:26:44 EDT  Ventricular Rate: 80  PR Interval: 189  QRS Duration: 102  QT Interval: 416  QTC Calculation: 480  R Axis: incomplete left axis deviation Text Interpretation: Sinus rhythm, biatrial enlargement, Left  ventricular hypertrophy, possible Anterior infarct, acute (LAD). Has LBBB  but seen in previous tracings, TWI in aVL and V6 seen in previous tracings.     Assessment & Plan by Problem: 58 year old man with PMH of ESRD on HD T/Th/Sat, HTN, cocaine abuse, recently discharged on 10/13 after dx of optic neuritis, on prednisone, presenting with worsening left eye pain and headache, and hypertensive urgency.   Accelerated Hypertension/Hypertensive urgency: BP of 243/91 initially, likely causing worsening of his headache.His BP improved to 188/71 after labetalol 10mg  IV and hydralazine 10mg  IV x2.  Denies back pain, chest pain, SOB. CXR with no acute pulmonary edema. Aortic dissection unlikely at this time with no back pain, no chest pain, no widening of the mediastinum (BP not accurate in both arms due to AVF in left UE). His poc troponin is elevated with questionable EKG changes but he has no chest pain, per Cardiology, STEMI unlikely at this time, hypertensive urgency could explain the EKG changes. He does not have focal Neurological changes with CT head with no acute intracranial changes. The etiology of his hypertensive urgency is likely multifactorial with current use of prednisone for his optic neuritis and recent discontinuation of his antihypertensives (due to his orthostatic hypotension), as well as pain. Recent cocaine should also be considered (pt denies use in past 3 weeks but has had multiple  recent UDS positive for cocaine).  -Admit to telemetry -hydralazine 25mg  PO TID -UDS STAT -repeat EKG PRN for chest pain and in AM -Cycle troponin  Optic Neuritis, left: He has had slow, gradual improvement of his symptoms. His acute worsening of the left eye pain and headache are likely due to his hypertensive urgency. CT head and maxillary without contrast shows no worsening inflammatory changes involving the orbits but he does have periodontal disease which could predispose him to orbital cellulitis. However, he has no evidence of orbital cellulitis at this time with no fever/chills, no leucocytosis, no changes in CT, no redness/swelling on physical exam. He reports that he has appointment with his dentist on 10/19 for 13 teeth extraction/drilling.  -Continue prednisone 60mg  daily -Consider consult with Ophthalmology if symptoms continue to worsen -Dr. Linus Salmons in [place] has been consulted from ED and will see the pt--he recommends continuing prednisone at this time -Norco PRN for pain  ESRD: On HD T/Th/Sat at [facility]. Has been compliant with last HD session yesterday. Does not appear volume overloaded on exam. Potassium stable at 4.7.  -Will need Nephrology consult for HD tomorrow -Continue home [place]  Hx of Anemia: Of chronic disease. Hgb stable at 13.6 but has fluctuated this past week between 11-12.   Protein-calorie malnutrition: Likely due to chronic medical disease. Denies melena, hematochezia, nightsweats.  -Continue supplemental shakes  Hx of cocaine use: Denies use after discharge from the hospital.  -UDS STAT--may be difficult to collect as pt void  only 1-2 per day  Hx of seizures: No recent seizure activity. Continue home [place].   DM2: Diet controlled. Last Hgb A1C 5.0% on 11/07/12.   Hepatitis C: Per Nephrology pt tested positive for hepatitis C on 02/28/12 but no record found in EPIC.  -Consider testing for hepatitis C again as he may be a candidate for tx   DVT  prophylaxis: heparin TID [place]  FEN:  NSL Renal panel in am Renal diet   Dispo: Disposition is deferred at this time, awaiting improvement of current medical problems. Anticipated discharge in approximately 1-2 day(s).   The patient does have a current PCP Windy Kalata, MD) and does not need an [facility] hospital follow-up appointment after discharge.  The patient does have transportation limitations that hinder transportation to clinic appointments.  Signed: Blain Pais, MD PGY3, IMTS 02/01/2014, 11:11 AM

## 2014-02-01 NOTE — Progress Notes (Signed)
Orthostatic vitals obtained on patient.  Patient blood pressure elevated at 245/90 on Dinamap.  Manual blood pressure 210/98.  MD paged.  IV Hydralazine ordered.  Will continue to monitor.

## 2014-02-02 LAB — CBC
HCT: 35.1 % — ABNORMAL LOW (ref 39.0–52.0)
HEMOGLOBIN: 12.1 g/dL — AB (ref 13.0–17.0)
MCH: 29.2 pg (ref 26.0–34.0)
MCHC: 34.5 g/dL (ref 30.0–36.0)
MCV: 84.6 fL (ref 78.0–100.0)
Platelets: 214 10*3/uL (ref 150–400)
RBC: 4.15 MIL/uL — ABNORMAL LOW (ref 4.22–5.81)
RDW: 15.3 % (ref 11.5–15.5)
WBC: 8.1 10*3/uL (ref 4.0–10.5)

## 2014-02-02 LAB — RENAL FUNCTION PANEL
ANION GAP: 19 — AB (ref 5–15)
Albumin: 2.9 g/dL — ABNORMAL LOW (ref 3.5–5.2)
BUN: 83 mg/dL — ABNORMAL HIGH (ref 6–23)
CO2: 23 meq/L (ref 19–32)
Calcium: 8.4 mg/dL (ref 8.4–10.5)
Chloride: 93 mEq/L — ABNORMAL LOW (ref 96–112)
Creatinine, Ser: 9.27 mg/dL — ABNORMAL HIGH (ref 0.50–1.35)
GFR, EST AFRICAN AMERICAN: 6 mL/min — AB (ref >90)
GFR, EST NON AFRICAN AMERICAN: 6 mL/min — AB (ref >90)
GLUCOSE: 95 mg/dL (ref 70–99)
POTASSIUM: 5.5 meq/L — AB (ref 3.7–5.3)
Phosphorus: 6.6 mg/dL — ABNORMAL HIGH (ref 2.3–4.6)
SODIUM: 135 meq/L — AB (ref 137–147)

## 2014-02-02 MED ORDER — HYDROMORPHONE HCL 1 MG/ML IJ SOLN
1.0000 mg | Freq: Once | INTRAMUSCULAR | Status: AC
  Administered 2014-02-02: 1 mg via INTRAVENOUS

## 2014-02-02 MED ORDER — HYDROMORPHONE HCL 1 MG/ML IJ SOLN
INTRAMUSCULAR | Status: AC
  Administered 2014-02-02: 1 mg via INTRAVENOUS
  Filled 2014-02-02: qty 1

## 2014-02-02 MED ORDER — DOXERCALCIFEROL 4 MCG/2ML IV SOLN
INTRAVENOUS | Status: AC
  Filled 2014-02-02: qty 2

## 2014-02-02 MED ORDER — DOXERCALCIFEROL 4 MCG/2ML IV SOLN
2.0000 ug | INTRAVENOUS | Status: DC
  Administered 2014-02-02 – 2014-02-05 (×2): 2 ug via INTRAVENOUS
  Filled 2014-02-02: qty 2

## 2014-02-02 NOTE — Progress Notes (Signed)
Subjective:  Seen on HD- pain control is still an issue as well as blood pressure Objective: Vital signs in last 24 hours: Temp:  [98 F (36.7 C)-98.6 F (37 C)] 98 F (36.7 C) (10/17 0821) Pulse Rate:  [74-89] 74 (10/17 0900) Resp:  [12-24] 14 (10/17 0821) BP: (166-245)/(70-118) 196/100 mmHg (10/17 0900) SpO2:  [90 %-99 %] 98 % (10/17 0521) Weight:  [59.3 kg (130 lb 11.7 oz)-60.9 kg (134 lb 4.2 oz)] 59.3 kg (130 lb 11.7 oz) (10/17 0821) Weight change:   Intake/Output from previous day:   Intake/Output this shift:   Lab Results:  Recent Labs  02/01/14 0749 02/01/14 0758 02/02/14 0835  WBC 9.9  --  8.1  HGB 13.6 15.3 12.1*  HCT 40.4 45.0 35.1*  PLT 214  --  214   BMET:   Recent Labs  02/01/14 0749 02/01/14 0758  NA 137 133*  K 4.7 4.3  CL 94* 105  CO2 24  --   GLUCOSE 153* 154*  BUN 64* 58*  CREATININE 7.28* 7.40*  CALCIUM 9.1  --   ALBUMIN 3.5  --    No results found for this basename: PTH,  in the last 72 hours Iron Studies: No results found for this basename: IRON, TIBC, TRANSFERRIN, FERRITIN,  in the last 72 hours  Studies/Results: Dg Chest 1 View  02/01/2014   CLINICAL DATA:  Headache.  EXAM: CHEST - 1 VIEW  COMPARISON:  01/19/2014  FINDINGS: Heart is upper limits normal in size. Lungs are clear. No effusions or acute bony abnormality.  IMPRESSION: No active disease.   Electronically Signed   By: Rolm Baptise M.D.   On: 02/01/2014 08:55   Ct Head Wo Contrast  02/01/2014   CLINICAL DATA:  Diffuse headache, hypertension, and left eye swelling. Prior stroke with residual left-sided weakness.  EXAM: CT HEAD WITHOUT CONTRAST  CT MAXILLOFACIAL WITHOUT CONTRAST  TECHNIQUE: Multidetector CT imaging of the head and maxillofacial structures were performed using the standard protocol without intravenous contrast. Multiplanar CT image reconstructions of the maxillofacial structures were also generated.  COMPARISON:  Brain MRI 01/21/2014. Head CT 01/19/2014.  Maxillofacial CT 01/20/2014.  FINDINGS: CT HEAD FINDINGS  There is no evidence of acute cortical infarct, intracranial hemorrhage, mass, midline shift, or extra-axial fluid collection. Ventricles and sulci are within normal limits for age. Patchy and confluent periventricular and subcortical white matter hypodensities are similar to the prior studies and nonspecific but compatible with moderate chronic small vessel ischemic disease. Mastoid air cells and visualized paranasal sinuses are clear. Moderate carotid siphon calcification is noted. No skull fracture is seen.  CT MAXILLOFACIAL FINDINGS  The globes appear intact. Mild left proptosis is not excluded. The extraocular muscles and lacrimal glands are unremarkable. No significant inflammatory changes are identified in the orbital fat. No significant preseptal inflammatory change/ soft tissue thickening is identified. There is no orbital mass. No fluid collection is identified. There is trace left maxillary sinus mucosal thickening. There is poor dentition with multiple periapical lucencies, most prominently involving the left maxillary second molar.  IMPRESSION: 1. No evidence of acute intracranial abnormality. 2. Moderate chronic small vessel ischemic disease. 3. No significant inflammatory changes involving the orbits. 4. Periodontal disease.   Electronically Signed   By: Logan Bores   On: 02/01/2014 09:07   Ct Maxillofacial Wo Cm  02/01/2014   CLINICAL DATA:  Diffuse headache, hypertension, and left eye swelling. Prior stroke with residual left-sided weakness.  EXAM: CT HEAD WITHOUT CONTRAST  CT  MAXILLOFACIAL WITHOUT CONTRAST  TECHNIQUE: Multidetector CT imaging of the head and maxillofacial structures were performed using the standard protocol without intravenous contrast. Multiplanar CT image reconstructions of the maxillofacial structures were also generated.  COMPARISON:  Brain MRI 01/21/2014. Head CT 01/19/2014. Maxillofacial CT 01/20/2014.  FINDINGS:  CT HEAD FINDINGS  There is no evidence of acute cortical infarct, intracranial hemorrhage, mass, midline shift, or extra-axial fluid collection. Ventricles and sulci are within normal limits for age. Patchy and confluent periventricular and subcortical white matter hypodensities are similar to the prior studies and nonspecific but compatible with moderate chronic small vessel ischemic disease. Mastoid air cells and visualized paranasal sinuses are clear. Moderate carotid siphon calcification is noted. No skull fracture is seen.  CT MAXILLOFACIAL FINDINGS  The globes appear intact. Mild left proptosis is not excluded. The extraocular muscles and lacrimal glands are unremarkable. No significant inflammatory changes are identified in the orbital fat. No significant preseptal inflammatory change/ soft tissue thickening is identified. There is no orbital mass. No fluid collection is identified. There is trace left maxillary sinus mucosal thickening. There is poor dentition with multiple periapical lucencies, most prominently involving the left maxillary second molar.  IMPRESSION: 1. No evidence of acute intracranial abnormality. 2. Moderate chronic small vessel ischemic disease. 3. No significant inflammatory changes involving the orbits. 4. Periodontal disease.   Electronically Signed   By: Logan Bores   On: 02/01/2014 09:07    Physical Exam:  General appearance:  Alert, in mild distress, [name] unable to open  Resp: CTA without rales, rhonchi, or wheezes Cardio: RRR no murmur or rub or gallop  GI: + BS, soft and nontender , nondistended  Extremities: No pedal edema  Access: AVF @ LUA with + bruit  Dialysis Orders: TTS @ AF  58.5 kg 4 hrs 2K/2.25Ca 400/A1.5 No Heparin AVF @ LUA  Hectorol 2Aranesp 80 mcg 9/29 and Venofer 50 mg on Thurs.   Assessment/Plan: 1. 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 Need to give a one time dilaudid in  HD- consider more liberal pain meds- especially the pills (something he could be discharged with)  2. ESRD - HD on TTS @ [facility] via aVF, K 4.3.  HD today 3. HTN/volume - BP 183/77 on Hydralazine 25 mg q8h; wt 62.6 kg, 2.4 L over EDW post-HD yesterday. Attempting more UF with HD today- then if BP still high may need modification of BP agents- currently on low dose hydralazine - probably contributing to his pain and prednisone is not helping- would prefer to use one time aday agent as might improve compliance 4. Anemia - Hgb 12, Aranesp on hold, continue weekly Fe.  5. Sec HPT - Ca 9.1 (9.5 corrected), P now 6.6 iPTH 221; Hectorol 2 mcg, Phoslo 3 with meals, one with snacks- phos should come down 6. Nutrition - Alb 3.5, renal diet, multivitamin.  7. Hx Dm Type 2  8. Hepatitis C  9. Hx seizure disorder - on Keppra.  10. Hx cocaine abuse    LOS: 1 day   Navaya Wiatrek A 02/02/2014,9:14 AM

## 2014-02-02 NOTE — Progress Notes (Signed)
MD on call informed of BP187/88 hr 79. No new orders. Arthor Captain LPN

## 2014-02-02 NOTE — Progress Notes (Signed)
Subjective: [name] was seen and examined this morning on HD.  Pain is better controlled with dilaudid.    Objective: Vital signs in last 24 hours: Filed Vitals:   02/01/14 1854 02/01/14 2127 02/02/14 0347 02/02/14 0521  BP: 175/73 184/76 187/88   Pulse: 79 78 79 80  Temp: 98.1 F (36.7 C) 98.3 F (36.8 C)  98.6 F (37 C)  TempSrc:  Oral  Oral  Resp:  14  18  Height:      Weight:  60.9 kg (134 lb 4.2 oz)    SpO2:  97%  98%   Weight change:   Intake/Output Summary (Last 24 hours) at 02/02/14 0750 Last data filed at 02/01/14 1852  Gross per 24 hour  Intake      0 ml  Output      0 ml  Net      0 ml   General: resting in bed on HD in NAD HEENT: pupils equal but NR, left eye with limited medial/upward/downward EOM (similar to prior exams), left eyelid slightly swollen and TTP Cardiac: RRR, no rubs, murmurs or gallops Pulm: clear to auscultation bilaterally, moving normal volumes of air Abd: soft, nontender, nondistended, BS present Ext: warm and well perfused, no pedal edema Neuro: alert and oriented X3, responding appropriately and able to move extremities  Lab Results: Basic Metabolic Panel:  Recent Labs Lab 01/29/14 0705 02/01/14 0749 02/01/14 0758  NA 131* 137 133*  K 4.5 4.7 4.3  CL 90* 94* 105  CO2 23 24  --   GLUCOSE 142* 153* 154*  BUN 81* 64* 58*  CREATININE 9.60* 7.28* 7.40*  CALCIUM 9.2 9.1  --   PHOS 2.7  --   --    Liver Function Tests:  Recent Labs Lab 01/29/14 0705 02/01/14 0749  AST  --  36  ALT  --  48  ALKPHOS  --  61  BILITOT  --  0.3  PROT  --  8.2  ALBUMIN 3.0* 3.5   CBC:  Recent Labs Lab 01/29/14 0705 02/01/14 0749 02/01/14 0758  WBC 9.6 9.9  --   NEUTROABS  --  7.5  --   HGB 11.4* 13.6 15.3  HCT 33.4* 40.4 45.0  MCV 83.7 87.4  --   PLT 185 214  --    Studies/Results: Dg Chest 1 View  02/01/2014   CLINICAL DATA:  Headache.  EXAM: CHEST - 1 VIEW  COMPARISON:  01/19/2014  FINDINGS: Heart is upper limits normal  in size. Lungs are clear. No effusions or acute bony abnormality.  IMPRESSION: No active disease.   Electronically Signed   By: Rolm Baptise M.D.   On: 02/01/2014 08:55   Ct Head Wo Contrast  02/01/2014   CLINICAL DATA:  Diffuse headache, hypertension, and left eye swelling. Prior stroke with residual left-sided weakness.  EXAM: CT HEAD WITHOUT CONTRAST  CT MAXILLOFACIAL WITHOUT CONTRAST  TECHNIQUE: Multidetector CT imaging of the head and maxillofacial structures were performed using the standard protocol without intravenous contrast. Multiplanar CT image reconstructions of the maxillofacial structures were also generated.  COMPARISON:  Brain MRI 01/21/2014. Head CT 01/19/2014. Maxillofacial CT 01/20/2014.  FINDINGS: CT HEAD FINDINGS  There is no evidence of acute cortical infarct, intracranial hemorrhage, mass, midline shift, or extra-axial fluid collection. Ventricles and sulci are within normal limits for age. Patchy and confluent periventricular and subcortical white matter hypodensities are similar to the prior studies and nonspecific but compatible with moderate chronic small vessel  ischemic disease. Mastoid air cells and visualized paranasal sinuses are clear. Moderate carotid siphon calcification is noted. No skull fracture is seen.  CT MAXILLOFACIAL FINDINGS  The globes appear intact. Mild left proptosis is not excluded. The extraocular muscles and lacrimal glands are unremarkable. No significant inflammatory changes are identified in the orbital fat. No significant preseptal inflammatory change/ soft tissue thickening is identified. There is no orbital mass. No fluid collection is identified. There is trace left maxillary sinus mucosal thickening. There is poor dentition with multiple periapical lucencies, most prominently involving the left maxillary second molar.  IMPRESSION: 1. No evidence of acute intracranial abnormality. 2. Moderate chronic small vessel ischemic disease. 3. No significant  inflammatory changes involving the orbits. 4. Periodontal disease.   Electronically Signed   By: Logan Bores   On: 02/01/2014 09:07   Ct Maxillofacial Wo Cm  02/01/2014   CLINICAL DATA:  Diffuse headache, hypertension, and left eye swelling. Prior stroke with residual left-sided weakness.  EXAM: CT HEAD WITHOUT CONTRAST  CT MAXILLOFACIAL WITHOUT CONTRAST  TECHNIQUE: Multidetector CT imaging of the head and maxillofacial structures were performed using the standard protocol without intravenous contrast. Multiplanar CT image reconstructions of the maxillofacial structures were also generated.  COMPARISON:  Brain MRI 01/21/2014. Head CT 01/19/2014. Maxillofacial CT 01/20/2014.  FINDINGS: CT HEAD FINDINGS  There is no evidence of acute cortical infarct, intracranial hemorrhage, mass, midline shift, or extra-axial fluid collection. Ventricles and sulci are within normal limits for age. Patchy and confluent periventricular and subcortical white matter hypodensities are similar to the prior studies and nonspecific but compatible with moderate chronic small vessel ischemic disease. Mastoid air cells and visualized paranasal sinuses are clear. Moderate carotid siphon calcification is noted. No skull fracture is seen.  CT MAXILLOFACIAL FINDINGS  The globes appear intact. Mild left proptosis is not excluded. The extraocular muscles and lacrimal glands are unremarkable. No significant inflammatory changes are identified in the orbital fat. No significant preseptal inflammatory change/ soft tissue thickening is identified. There is no orbital mass. No fluid collection is identified. There is trace left maxillary sinus mucosal thickening. There is poor dentition with multiple periapical lucencies, most prominently involving the left maxillary second molar.  IMPRESSION: 1. No evidence of acute intracranial abnormality. 2. Moderate chronic small vessel ischemic disease. 3. No significant inflammatory changes involving the  orbits. 4. Periodontal disease.   Electronically Signed   By: Logan Bores   On: 02/01/2014 09:07   Medications: I have reviewed the patient's current medications. Scheduled Meds: . aspirin EC  81 mg Oral Daily  . calcium acetate  1,334 mg Oral TID WC  . feeding supplement (NEPRO CARB STEADY)  237 mL Oral TID WC  . heparin  5,000 Units Subcutaneous 3 times per day  . hydrALAZINE  25 mg Oral 3 times per day  . levETIRAcetam  1,000 mg Oral Daily  . predniSONE  60 mg Oral Q breakfast  . sodium chloride  3 mL Intravenous Q12H  . sodium chloride  3 mL Intravenous Q12H   Continuous Infusions:  PRN Meds:.sodium chloride, calcium acetate, HYDROcodone-acetaminophen, HYDROmorphone (DILAUDID) injection, ondansetron (ZOFRAN) IV, ondansetron, sodium chloride Assessment/Plan: 58 year old man with PMH of ESRD on HD T/Th/Sat, HTN, cocaine abuse, recently discharged on 10/13 after dx of optic neuritis, on prednisone, presenting with worsening left eye pain and headache, and hypertensive urgency.   Accelerated Hypertension/Hypertensive urgency: SBPs 170-180s overnight treated with IV hydralazine.  He went to HD today and BPs have been normotensive  with po hydralazine 25mg .  poc Trop was 0.13 with LVH on EKG.  Subsequent trops have been negative.  - continue hydral po for now - will plan to switch to other oral agent, likely CCB  Optic perineuritis, left: He says his pain is controlled with Dilaudid. Eye exam is similar to prior admission without worsening of vision.   - continue prednisone 60mg  daily  - Ophthalmology has been informed he is re-hospitalized; he will need to follow-up with them at discharge - dilaudid and norco prn for pain  ESRD: On HD T/Th/Sat at [facility].   - HD per nephrology - continue home phoslo   Hx of Anemia: Of chronic disease. Stable.  Protein-calorie malnutrition: Likely due to chronic medical disease. Denies melena, hematochezia, nightsweats.  - continue supplemental  shakes   Hx of cocaine use: Denies use after discharge from the hospital. He was at SNF so he likely has not used.  - UDS pending UOP  Hx of seizures: No recent seizure activity. Continue home [place].   DM2: Diet controlled. Last Hgb A1C 5.0% on 11/07/12.   Hepatitis C: Per Nephrology pt tested positive for hepatitis C on 02/28/12 but no record found in EPIC.  -Consider testing for hepatitis C again as he may be a candidate for tx   Diet:  renal DVT prophylaxis: heparin TID [place]  Code: Full  Dispo: Disposition is deferred at this time, awaiting improvement of current medical problems.  Anticipated discharge in approximately 1-2 day(s).   The patient does have a current PCP Windy Kalata, MD) and does not need an [facility] hospital follow-up appointment after discharge.  The patient does not have transportation limitations that hinder transportation to clinic appointments.  .Services Needed at time of discharge: Y = Yes, Blank = No PT:   OT:   RN:   Equipment:   Other:     LOS: 1 day   Francesca Oman, DO 02/02/2014, 7:50 AM

## 2014-02-02 NOTE — Procedures (Signed)
Patient was seen on dialysis and the procedure was supervised.  BFR 400  Via AVF BP is  196/100.   Patient appears to be tolerating treatment well  [name] A 02/02/2014

## 2014-02-02 NOTE — Progress Notes (Signed)
OT Cancellation Note  Patient Details Name: [name] MRN: [id number] DOB: [birth year]   Cancelled Treatment:    Reason Eval/Treat Not Completed: Medical issues which prohibited therapy (HD)  Parke Poisson B 02/02/2014, 8:52 AM Pager: [phone]

## 2014-02-02 NOTE — Progress Notes (Signed)
PT Cancellation Note  Patient Details Name: [name] MRN: [id number] DOB: 02-07-1956   Cancelled Treatment:    Reason Eval/Treat Not Completed: Patient at procedure or test/unavailable (HD)   [facility] 02/02/2014, 11:16 AM  Suanne Marker PT [phone]

## 2014-02-03 MED ORDER — HYDRALAZINE HCL 25 MG PO TABS
25.0000 mg | ORAL_TABLET | Freq: Three times a day (TID) | ORAL | Status: DC
  Administered 2014-02-03 – 2014-02-04 (×2): 25 mg via ORAL
  Filled 2014-02-03 (×5): qty 1

## 2014-02-03 NOTE — Progress Notes (Signed)
Subjective: [name] was seen in his room this afternoon. He was sleeping peacefully.   Objective: Vital signs in last 24 hours: Filed Vitals:   02/02/14 2050 02/03/14 0539 02/03/14 0957 02/03/14 1428  BP: 101/64 139/71 115/70 104/63  Pulse: 84 78 79 83  Temp: 98.6 F (37 C) 98.2 F (36.8 C) 98 F (36.7 C)   TempSrc: Oral Oral Oral   Resp: 16 16 22    Height:      Weight: 55.2 kg (121 lb 11.1 oz)     SpO2: 97% 99% 96%    Weight change: -3.297 kg (-7 lb 4.3 oz) No intake or output data in the 24 hours ending 02/03/14 1623  General: resting in bed in NAD, sleeping    Lab Results:  Results for COBEN, [name] (MRN [id number]) as of 02/03/2014 15:45  Ref. Range 02/02/2014 08:35 02/02/2014 08:36  Sodium Latest Range: 136-145 mEq/L  135 (L)  Potassium Latest Range: 3.5-5.1 mEq/L  5.5 (H)  Chloride Latest Range: 96-112 mEq/L  93 (L)  CO2 Latest Range: 19-32 mEq/L  23  BUN Latest Range: 7.0-26.0 mg/dL  83 (H)  Creatinine Latest Range: 0.50-1.35 mg/dL  9.27 (H)  Calcium Latest Range: 8.4-10.5 mg/dL  8.4  GFR calc non Af Amer Latest Range: >90 mL/min  6 (L)  GFR calc Af Amer Latest Range: >90 mL/min  6 (L)  Glucose Latest Range: 70-99 mg/dL  95  Anion gap Latest Range: 5-15   19 (H)  Phosphorus Latest Range: 2.3-4.6 mg/dL  6.6 (H)  Albumin Latest Range: 3.5-5.2 g/dL  2.9 (L)  WBC Latest Range: 4.0-10.5 K/uL 8.1   RBC Latest Range: 4.22-5.81 MIL/uL 4.15 (L)   Hemoglobin Latest Range: 13.0-17.0 g/dL 12.1 (L)   HCT Latest Range: 39.0-52.0 % 35.1 (L)   MCV Latest Range: 78.0-100.0 fL 84.6   MCH Latest Range: 26.0-34.0 pg 29.2   MCHC Latest Range: 30.0-36.0 g/dL 34.5   RDW Latest Range: 11.5-15.5 % 15.3   Platelets Latest Range: 150-400 K/uL 214     Medications:  Scheduled Meds: . aspirin EC  81 mg Oral Daily  . calcium acetate  1,334 mg Oral TID WC  . doxercalciferol  2 mcg Intravenous Q T,Th,Sa-HD  . feeding supplement (NEPRO CARB STEADY)  237 mL Oral TID WC  . heparin   5,000 Units Subcutaneous 3 times per day  . hydrALAZINE  25 mg Oral 3 times per day  . levETIRAcetam  1,000 mg Oral Daily  . predniSONE  60 mg Oral Q breakfast  . sodium chloride  3 mL Intravenous Q12H  . sodium chloride  3 mL Intravenous Q12H   Continuous Infusions:  PRN Meds:.sodium chloride, calcium acetate, HYDROcodone-acetaminophen, HYDROmorphone (DILAUDID) injection, ondansetron (ZOFRAN) IV, ondansetron, sodium chloride  Assessment/Plan: Principal Problem:   Hypertensive urgency Active Problems:   Diabetes mellitus without complication   ESRD on hemodialysis   Seizure disorder   Cocaine abuse   Optic neuritis, left   Protein-calorie malnutrition, severe   Orthostatic hypotension   Accelerated hypertension  [name] is a 58 year old man with PMH of ESRD on HD T/Th/Sat, HTN, cocaine abuse, recently discharged on 10/13 after dx of optic perineuritis, on prednisone, presenting with worsening left eye pain and headache and hypertensive urgency.   Accelerated Hypertension/Hypertensive urgency: BPs normotensive in 120s-130s/70s after HD yesterday where he had 4L removed and continued on 25 mg PO hydralazine three times daily. BPs normotensive since HD yesterday (4L removed) and with 25mg  po hydralazine. His  new EDW is 55Kg. This afternoon, RN informed that his standing BP was 104/63 and patient reported feeling dizzy and lightheaded.  However, yesterday, he was able to walk with PT down the hallways but did experience lightheadedness upon returning to the room. During his last admission, hydralazine was held with concerns for hydralazine. Will need to switch to another oral agent before discharge. - Hold hydralazine for now  - ask RN to check standing pressures  - can consider switch to other oral agent, likely CCB to make compliance easier  - neuro checks q4h   Optic perineuritis, left: Less swelling compared to previous admission, but still reports of inability to open [name] and  binocular diplopia. Based on MRI/MRA of head and orbit on 10/05, no evidence of cavernous sinus thrombosis was seen and mild distention of L optic nerve sheath was noted. According to Dr. Posey Pronto, the significant inflammation of the perineural sheath of the L eye is likely consistent with optic perineuritis, a rare variant of orbital pseudotumor. ACE level is normal, RPR is NR, ANCA antibodies negative. Dr. Tessa Lerner had mentioned a case report of recurrent optic perineuritis after intranasal cocaine abuse (Coppens et al. Neuro-Ophthalmology 30(2): [phone]) though patient reports smoking cocaine and very occasional use of it intranasally. Patient's MRI had noted minimal edema in the base of L maxillary sinus and CT maxillary from 10/4 noted a soft density at L maxilla eye with destructive changes of posterior molar, suggesting peridontal inflammation and with a further discussion with radiologist on 10/5, there are likely other abnormalities in his oral cavity including dental caries in his R molars and loose dental roots and given this, one can consider this serving as nidus of infection for his presentation.  - informed Dr. Posey Pronto that patient is admitted  - continue 60 mg PO prednisone - continue Norco/Vicodin 5-325 mg q4h PRN pain  - continue 0.5 Dilaudid IV q8h PRN pain   Diabetes Mellitus: Hgb A1c 5.0 July 2014. Serum BG 165. [name] not currently on treatment. Has not needed SSI after initiation of steroids   ESRD on hemodialysis, TThS schedule: received HD yesterday, 10/17 but stopped early due to cramps. - HD per nephrology  - appreciate nephro's involvement   Cocaine abuse: patient has a long history of cocaine abuse with recent use being about three days ago before presentation on last admission. He admits to no use in between the discharge time between this and previous admission.  - counseled on cessation   Seizure disorder: No seizure activity during last hospitalization or during this  admission.  - continue home Keppra   VTE ppx: heparin   Diet: renal diet with 1200 mL fluid restriction   Disposition: OT recommends patient needs SNF placement and patient agrees to go. PT recommends home PT services, which patient has denied given that the people he live with are not conducive to having those services.  --consulted and appreciate SW to offer living options for patient  --patient has been approved for housing and may go after SNF placement   This is a Careers information officer Note.  The care of the patient was discussed with Dr. Megan Salon and Dr. Hayes Ludwig and the assessment and plan formulated with their assistance. Please see their attached note for official documentation of the daily encounter.   LOS: 2 days   Josiah Lobo, Med Student 02/03/2014, 4:23 PM

## 2014-02-03 NOTE — Evaluation (Signed)
Physical Therapy Evaluation Patient Details Name: [name] MRN: [id number] DOB: 1955-09-13 Today's Date: 02/03/2014   History of Present Illness  Pt. was admitted on 02/01/14 with worsening of L eye pain, headache and hypertensive urgency.  Pt. recently DC from hospital on 01/29/14 with dx. of left optic neuritis.  PMH inculdes ESRD on HD TTS. History of cocaine abuse, hep. C, anemia of chronic disease,  malnutrition and HTN.    Clinical Impression  Pt. Presents to PT with decreased independence in his functional mobility and gait and will benefit from a short duration of PT sessions to work toward maximizing his independence in the above for eventual return home.      Follow Up Recommendations SNF    Equipment Recommendations  Other (comment) (? RW depending on his progression)    Recommendations for Other Services OT consult     Precautions / Restrictions Precautions Precautions: Fall Precaution Comments: Pt. with unsteadiness while up ambulation; access AVF @LUA  Restrictions Weight Bearing Restrictions: No      Mobility  Bed Mobility Overal bed mobility: Modified Independent Bed Mobility: Supine to Sit     Supine to sit: Modified independent (Device/Increase time)        Transfers Overall transfer level: Needs assistance Equipment used: Straight cane Transfers: Sit to/from Stand Sit to Stand: Supervision         General transfer comment: relies on hands for transitions.   Ambulation/Gait Ambulation/Gait assistance: Supervision Ambulation Distance (Feet): 200 Feet Assistive device: Straight cane Gait Pattern/deviations: Shuffle;Step-through pattern;Narrow base of support (slight shuffle during swing phase) Gait velocity: decreased   General Gait Details: Mild unsteadiness with use of straight cane and foot shuffle due to weakness  Stairs            Wheelchair Mobility    Modified Rankin (Stroke Patients Only)       Balance Overall  balance assessment: Needs assistance Sitting-balance support: No upper extremity supported;Feet supported Sitting balance-Leahy Scale: Good     Standing balance support: Single extremity supported;During functional activity Standing balance-Leahy Scale: Poor Standing balance comment: due to need for single paint cane                             Pertinent Vitals/Pain Pain Assessment: No/denies pain    Home Living Family/patient expects to be discharged to:: Skilled nursing facility (back to [facility])                      Prior Function Level of Independence: Independent         Comments: Pt. reports he has been able to walk around the grounds at [facility] with his cane an no assist, though unsteady     Hand Dominance        Extremity/Trunk Assessment   Upper Extremity Assessment: Overall WFL for tasks assessed (for simple mobility tasks)           Lower Extremity Assessment: Generalized weakness (muscle atrophy noted; pt. cachetic)      Cervical / Trunk Assessment: Normal  Communication   Communication: No difficulties  Cognition Arousal/Alertness: Awake/alert Behavior During Therapy: WFL for tasks assessed/performed Overall Cognitive Status: No family/caregiver present to determine baseline cognitive functioning Area of Impairment: Safety/judgement         Safety/Judgement: Decreased awareness of safety     General Comments: Says he walks off the grounds at [facility] so he can smoke, as  it is a smoke free campus per pt.    General Comments      Exercises        Assessment/Plan    PT Assessment Patient needs continued PT services  PT Diagnosis Difficulty walking;Generalized weakness;Acute pain   PT Problem List Decreased strength;Decreased activity tolerance;Decreased balance;Decreased mobility;Decreased coordination;Decreased knowledge of use of DME;Pain  PT Treatment Interventions DME instruction;Gait  training;Functional mobility training;Therapeutic activities;Therapeutic exercise;Balance training;Patient/family education   PT Goals (Current goals can be found in the Care Plan section) Acute Rehab PT Goals Patient Stated Goal: back to [facility] PT Goal Formulation: With patient Time For Goal Achievement: 02/17/14 Potential to Achieve Goals: Good    Frequency Min 3X/week   Barriers to discharge   pt. expects he will retrun to [facility] at time of DC     Co-evaluation               End of Session Equipment Utilized During Treatment: Gait belt Activity Tolerance: Patient tolerated treatment well Patient left: in chair;with call bell/phone within reach Nurse Communication: Mobility status         Time: 2330-0762 PT Time Calculation (min): 14 min   Charges:   PT Evaluation $Initial PT Evaluation Tier I: 1 Procedure PT Treatments $Gait Training: 8-22 mins   PT G CodesLadona Ridgel 02/03/2014, 10:07 AM Gerlean Ren PT Acute Rehab Services [phone] Beeper [phone]

## 2014-02-03 NOTE — Progress Notes (Deleted)
Subjective: [name] was seen in his room this afternoon. He reported feeling dizzy upon standing this afternoon. He would like to leave soon. He reports some soreness in his L eye but does say that his pain is well controlled with the IV Dilaudid.   Objective: Vital signs in last 24 hours: Filed Vitals:   02/02/14 2050 02/03/14 0539 02/03/14 0957 02/03/14 1428  BP: 101/64 139/71 115/70 104/63  Pulse: 84 78 79 83  Temp: 98.6 F (37 C) 98.2 F (36.8 C) 98 F (36.7 C)   TempSrc: Oral Oral Oral   Resp: 16 16 22    Height:      Weight: 55.2 kg (121 lb 11.1 oz)     SpO2: 97% 99% 96%    Weight change: -3.297 kg (-7 lb 4.3 oz) No intake or output data in the 24 hours ending 02/03/14 1545  General: resting in bed in NAD  HEENT: Less periorbital swelling on L compared to R from initial presentation; complete ptosis of [name]; binocular diplopia, EOM in L eye continue to be limited and L abduction may be more grossly intact; L and R eye pupils are somewhat reactive to light today and R eye pupil is constricted compared to the previous days  Cardiac: RRR, no rubs, murmurs or gallops  Pulm: clear to auscultation bilaterally, moving normal volumes of air  Abd: soft, nontender, nondistended, BS present  Ext: warm and well perfused, no pedal edema.  Neuro: alert and oriented X3, responding appropriately, following commands. Gait not assessed today.    Lab Results:  Results for [name] (MRN [id number]) as of 02/03/2014 15:45  Ref. Range 02/02/2014 08:35 02/02/2014 08:36  Sodium Latest Range: 136-145 mEq/L  135 (L)  Potassium Latest Range: 3.5-5.1 mEq/L  5.5 (H)  Chloride Latest Range: 96-112 mEq/L  93 (L)  CO2 Latest Range: 19-32 mEq/L  23  BUN Latest Range: 7.0-26.0 mg/dL  83 (H)  Creatinine Latest Range: 0.50-1.35 mg/dL  9.27 (H)  Calcium Latest Range: 8.4-10.5 mg/dL  8.4  GFR calc non Af Amer Latest Range: >90 mL/min  6 (L)  GFR calc Af Amer Latest Range: >90 mL/min  6 (L)    Glucose Latest Range: 70-99 mg/dL  95  Anion gap Latest Range: 5-15   19 (H)  Phosphorus Latest Range: 2.3-4.6 mg/dL  6.6 (H)  Albumin Latest Range: 3.5-5.2 g/dL  2.9 (L)  WBC Latest Range: 4.0-10.5 K/uL 8.1   RBC Latest Range: 4.22-5.81 MIL/uL 4.15 (L)   Hemoglobin Latest Range: 13.0-17.0 g/dL 12.1 (L)   HCT Latest Range: 39.0-52.0 % 35.1 (L)   MCV Latest Range: 78.0-100.0 fL 84.6   MCH Latest Range: 26.0-34.0 pg 29.2   MCHC Latest Range: 30.0-36.0 g/dL 34.5   RDW Latest Range: 11.5-15.5 % 15.3   Platelets Latest Range: 150-400 K/uL 214     Medications:  Scheduled Meds: . aspirin EC  81 mg Oral Daily  . calcium acetate  1,334 mg Oral TID WC  . doxercalciferol  2 mcg Intravenous Q T,Th,Sa-HD  . feeding supplement (NEPRO CARB STEADY)  237 mL Oral TID WC  . heparin  5,000 Units Subcutaneous 3 times per day  . hydrALAZINE  25 mg Oral 3 times per day  . levETIRAcetam  1,000 mg Oral Daily  . predniSONE  60 mg Oral Q breakfast  . sodium chloride  3 mL Intravenous Q12H  . sodium chloride  3 mL Intravenous Q12H   Continuous Infusions:  PRN Meds:.sodium chloride, calcium acetate,  HYDROcodone-acetaminophen, HYDROmorphone (DILAUDID) injection, ondansetron (ZOFRAN) IV, ondansetron, sodium chloride  Assessment/Plan: Principal Problem:   Hypertensive urgency Active Problems:   Diabetes mellitus without complication   ESRD on hemodialysis   Seizure disorder   Cocaine abuse   Optic neuritis, left   Protein-calorie malnutrition, severe   Orthostatic hypotension   Accelerated hypertension  [name] is a 58 year old man with PMH of ESRD on HD T/Th/Sat, HTN, cocaine abuse, recently discharged on 10/13 after dx of optic perineuritis, on prednisone, presenting with worsening left eye pain and headache and hypertensive urgency.   Accelerated Hypertension/Hypertensive urgency: BPs normotensive in 120s-130s/70s after HD yesterday where he had 4L removed and continued on 25 mg PO hydralazine  three times daily. BPs normotensive since HD yesterday (4L removed) and with 25mg  po hydralazine. His new EDW is 55Kg. This afternoon, RN informed that his standing BP was 104/63 and patient reported feeling dizzy and lightheaded.  However, yesterday, he was able to walk with PT down the hallways but did experience lightheadedness upon returning to the room. During his last admission, hydralazine was held with concerns for hydralazine. Will need to switch to another oral agent before discharge. - Hold hydralazine for now  - ask RN to check standing pressures  - can consider switch to other oral agent, likely CCB to make compliance easier  - neuro checks q4h   Optic perineuritis, left: Less swelling compared to previous admission, but still reports of inability to open [name] and binocular diplopia. Based on MRI/MRA of head and orbit on 10/05, no evidence of cavernous sinus thrombosis was seen and mild distention of L optic nerve sheath was noted. According to Dr. Posey Pronto, the significant inflammation of the perineural sheath of the L eye is likely consistent with optic perineuritis, a rare variant of orbital pseudotumor. ACE level is normal, RPR is NR, ANCA antibodies negative. Dr. Tessa Lerner had mentioned a case report of recurrent optic perineuritis after intranasal cocaine abuse (Coppens et al. Neuro-Ophthalmology 30(2): [phone]) though patient reports smoking cocaine and very occasional use of it intranasally. Patient's MRI had noted minimal edema in the base of L maxillary sinus and CT maxillary from 10/4 noted a soft density at L maxilla eye with destructive changes of posterior molar, suggesting peridontal inflammation and with a further discussion with radiologist on 10/5, there are likely other abnormalities in his oral cavity including dental caries in his R molars and loose dental roots and given this, one can consider this serving as nidus of infection for his presentation.  - informed Dr. Posey Pronto that  patient is admitted  - continue 60 mg PO prednisone - continue Norco/Vicodin 5-325 mg q4h PRN pain  - continue 0.5 Dilaudid IV q8h PRN pain   Diabetes Mellitus: Hgb A1c 5.0 July 2014. Serum BG 165. [name] not currently on treatment. Has not needed SSI after initiation of steroids   ESRD on hemodialysis, TThS schedule: received HD yesterday, 10/17 but stopped early due to cramps. - HD per nephrology  - appreciate nephro's involvement   Cocaine abuse: patient has a long history of cocaine abuse with recent use being about three days ago before presentation on last admission. He admits to no use in between the discharge time between this and previous admission.  - counseled on cessation   Seizure disorder: No seizure activity during last hospitalization or during this admission.  - continue home Keppra   VTE ppx: heparin   Diet: renal diet with 1200 mL fluid restriction  Disposition: OT recommends patient needs SNF placement and patient agrees to go. PT recommends home PT services, which patient has denied given that the people he live with are not conducive to having those services.  --consulted and appreciate SW to offer living options for patient  --patient has been approved for housing and may go after SNF placement   This is a Careers information officer Note.  The care of the patient was discussed with Dr. Megan Salon and Dr. Hayes Ludwig and the assessment and plan formulated with their assistance. Please see their attached note for official documentation of the daily encounter.   LOS: 2 days   Josiah Lobo, Med Student 02/03/2014, 3:45 PM

## 2014-02-03 NOTE — Progress Notes (Signed)
Subjective: Mr.  Roback was seen and examined today.  His pain is controlled with IV pain med but he says it worsens after we examine the eye.  His appetite has returned today.  He was able to walk all the way down the hallway with PT today and experienced some lightheadedness when he got back to the room but otherwise tolerated well.   Objective: Vital signs in last 24 hours: Filed Vitals:   02/02/14 1239 02/02/14 2050 02/03/14 0539 02/03/14 0957  BP: 128/71 101/64 139/71 115/70  Pulse: 74 84 78 79  Temp: 98.5 F (36.9 C) 98.6 F (37 C) 98.2 F (36.8 C) 98 F (36.7 C)  TempSrc: Oral Oral Oral Oral  Resp: 18 16 16 22   Height:      Weight: 54 kg (119 lb 0.8 oz) 55.2 kg (121 lb 11.1 oz)    SpO2: 99% 97% 99% 96%   Weight change: -3.297 kg (-7 lb 4.3 oz)  Intake/Output Summary (Last 24 hours) at 02/03/14 1231 Last data filed at 02/02/14 1239  Gross per 24 hour  Intake      0 ml  Output   3861 ml  Net  -3861 ml   General: sitting up in chair with mostly eaten lunch tray at bedside; in NAD HEENT: pupils equal but NR, left eye with limited medial/upward/downward EOM (similar to prior exams), left eyelid slightly swollen and TTP Cardiac: RRR, no rubs, murmurs or gallops Pulm: clear to auscultation bilaterally, moving normal volumes of air Abd: soft, nontender, nondistended, BS present Ext: warm and well perfused, no pedal edema Neuro: alert and oriented X3, responding appropriately and able to move extremities  Lab Results: Basic Metabolic Panel:  Recent Labs Lab 01/29/14 0705 02/01/14 0749 02/01/14 0758 02/02/14 0836  NA 131* 137 133* 135*  K 4.5 4.7 4.3 5.5*  CL 90* 94* 105 93*  CO2 23 24  --  23  GLUCOSE 142* 153* 154* 95  BUN 81* 64* 58* 83*  CREATININE 9.60* 7.28* 7.40* 9.27*  CALCIUM 9.2 9.1  --  8.4  PHOS 2.7  --   --  6.6*   Liver Function Tests:  Recent Labs Lab 02/01/14 0749 02/02/14 0836  AST 36  --   ALT 48  --   ALKPHOS 61  --   BILITOT 0.3  --    PROT 8.2  --   ALBUMIN 3.5 2.9*   CBC:  Recent Labs Lab 02/01/14 0749 02/01/14 0758 02/02/14 0835  WBC 9.9  --  8.1  NEUTROABS 7.5  --   --   HGB 13.6 15.3 12.1*  HCT 40.4 45.0 35.1*  MCV 87.4  --  84.6  PLT 214  --  214   Studies/Results: No results found. Medications: I have reviewed the patient's current medications. Scheduled Meds: . aspirin EC  81 mg Oral Daily  . calcium acetate  1,334 mg Oral TID WC  . doxercalciferol  2 mcg Intravenous Q T,Th,Sa-HD  . feeding supplement (NEPRO CARB STEADY)  237 mL Oral TID WC  . heparin  5,000 Units Subcutaneous 3 times per day  . hydrALAZINE  25 mg Oral 3 times per day  . levETIRAcetam  1,000 mg Oral Daily  . predniSONE  60 mg Oral Q breakfast  . sodium chloride  3 mL Intravenous Q12H  . sodium chloride  3 mL Intravenous Q12H   Continuous Infusions:  PRN Meds:.sodium chloride, calcium acetate, HYDROcodone-acetaminophen, HYDROmorphone (DILAUDID) injection, ondansetron (ZOFRAN) IV, ondansetron, sodium chloride  Assessment/Plan: 58 year old man with PMH of ESRD on HD T/Th/Sat, HTN, cocaine abuse, recently discharged on 10/13 after dx of optic neuritis, on prednisone, presenting with worsening left eye pain and headache, and hypertensive urgency.   Accelerated Hypertension/Hypertensive urgency: BPs normotensive since HD yesterday (4L removed) and with 25mg  po hydralazine.  His new EDW is 55Kg.  He tolerated walk down the hallway with PT but experienced some lightheadedness upon returning to his room.  BP control without some orthostasis may be difficult to achieve. - continue hydral po for now - ask RN to check standing pressures  - can consider switch to other oral agent, likely CCB to make compliance easier   Optic perineuritis, left: He says his pain is controlled with Dilaudid. Eye exam is similar to prior admission without worsening of vision.   - continue prednisone 60mg  daily  - Ophthalmology has been informed he is  re-hospitalized; he will need to follow-up with them at discharge - dilaudid and norco prn for pain  ESRD: On HD T/Th/Sat at [facility].   - HD per nephrology - continue home phoslo   Hx of Anemia: Of chronic disease. Stable.  Protein-calorie malnutrition: Likely due to chronic medical disease. Denies melena, hematochezia, nightsweats.  - continue supplemental shakes   Hx of cocaine use: Denies use after discharge from the hospital. He was at SNF so he likely has not used.  - UDS pending UOP  Hx of seizures: No recent seizure activity. Continue home [place].   DM2: Diet controlled. Last Hgb A1C 5.0% on 11/07/12.   Hepatitis C: Per Nephrology pt tested positive for hepatitis C on 02/28/12 but no record found in EPIC.  -Consider testing for hepatitis C again as he may be a candidate for tx   Diet:  renal DVT prophylaxis: heparin TID [place]  Code: Full  Dispo: Disposition is deferred at this time, awaiting improvement of current medical problems.  Anticipated discharge in approximately 1-2 day(s).   The patient does have a current PCP Windy Kalata, MD) and does not need an [facility] hospital follow-up appointment after discharge.  The patient does not have transportation limitations that hinder transportation to clinic appointments.  .Services Needed at time of discharge: Y = Yes, Blank = No PT:   OT:   RN:   Equipment:   Other:     LOS: 2 days   Francesca Oman, DO 02/03/2014, 12:31 PM

## 2014-02-03 NOTE — Progress Notes (Signed)
  I have seen and examined the patient, and reviewed the daily progress note by Josiah Lobo, MS4 and discussed the care of the patient with them. Please see my progress note from 02/03/2014 for further details regarding assessment and plan.    Signed:  Francesca Oman, DO 02/03/2014, 5:12 PM

## 2014-02-03 NOTE — Progress Notes (Signed)
BP standing 104/63 HR 83, patient verbalized feeling dizzy and lightheaded.  MD notified.

## 2014-02-03 NOTE — Progress Notes (Signed)
Subjective:  Hungry this morning, but continues to have left eye pain, unable to open lid, frustrated by lack of progress.  With HD yest took off 4 liters BP is much better- 3 kg under his EDW  Objective: Vital signs in last 24 hours: Temp:  [98 F (36.7 C)-98.6 F (37 C)] 98.2 F (36.8 C) (10/18 0539) Pulse Rate:  [70-84] 78 (10/18 0539) Resp:  [14-18] 16 (10/18 0539) BP: (93-218)/(59-118) 139/71 mmHg (10/18 0539) SpO2:  [97 %-99 %] 99 % (10/18 0539) Weight:  [54 kg (119 lb 0.8 oz)-59.3 kg (130 lb 11.7 oz)] 55.2 kg (121 lb 11.1 oz) (10/17 2050) Weight change: -3.297 kg (-7 lb 4.3 oz)  Intake/Output from previous day: 10/17 0701 - 10/18 0700 In: -  Out: 3861  Intake/Output this shift:   Lab Results:  Recent Labs  02/01/14 0749 02/01/14 0758 02/02/14 0835  WBC 9.9  --  8.1  HGB 13.6 15.3 12.1*  HCT 40.4 45.0 35.1*  PLT 214  --  214   BMET:   Recent Labs  02/01/14 0749 02/01/14 0758 02/02/14 0836  NA 137 133* 135*  K 4.7 4.3 5.5*  CL 94* 105 93*  CO2 24  --  23  GLUCOSE 153* 154* 95  BUN 64* 58* 83*  CREATININE 7.28* 7.40* 9.27*  CALCIUM 9.1  --  8.4  ALBUMIN 3.5  --  2.9*   No results found for this basename: PTH,  in the last 72 hours Iron Studies: No results found for this basename: IRON, TIBC, TRANSFERRIN, FERRITIN,  in the last 72 hours  Studies/Results: No results found.  Physical Exam:  General appearance: Alert, in no apparent distress, [name] unable to open  Resp: CTA without rales, rhonchi, or wheezes  Cardio: RRR no murmur or rub or gallop  GI: + BS, soft and nontender , nondistended  Extremities: No pedal edema  Access: AVF @ LUA with + bruit   Dialysis Orders: TTS @ AF  58.5 kg 4 hrs 2K/2.25Ca 400/A1.5 No Heparin AVF @ LUA  Hectorol 2Aranesp 80 mcg 9/29 and Venofer 50 mg on Thurs.   Assessment/Plan: 1. Left eye pain - sec to optic perineuritis, per Ophthalmology and ID; on steroids, pain previously controlled; consider more liberal  pain meds- shorter interval ?  (something he could be discharged with).  2. ESRD - HD on TTS @ AF via AVF, K 5.5 pre-HDyesterday. HD on 10/20. Keep this new EDW of around 55 3. HTN/volume - BP 139/71 on Hydralazine 25 mg q8h; wt 55.2 kg s/p net UF 3.9 L yesterday, BP much better, but probably should consider qd or bid med to improve compliance.  4. Anemia - Hgb >12, Aranesp on hold, continue weekly Fe.  5. Sec HPT - Ca 8.4 (9.3 corrected), P 6.6, iPTH 221; Hectorol 2 mcg, Phoslo 3 with meals, added one with snacks, should improve. 6. Nutrition - Alb 2.9, renal diet, multivitamin.  7. Hx Dm Type 2  8. Hepatitis C  9. Hx seizure disorder - on Keppra.  10. Hx cocaine abuse      LOS: 2 days   [name],Berthel 02/03/2014,8:03 AM  Patient seen and examined, agree with above note with above modifications. He seems better but wont admit to such- BP is better so that should not be etiology for his pain- will defer pain management to primary team but consider decreased interval for oral narcotics- not sure if consult is planned for this hospitalization - next HD on Tuesday  [name], MD 02/03/2014

## 2014-02-04 DIAGNOSIS — E118 Type 2 diabetes mellitus with unspecified complications: Secondary | ICD-10-CM

## 2014-02-04 DIAGNOSIS — E46 Unspecified protein-calorie malnutrition: Secondary | ICD-10-CM

## 2014-02-04 DIAGNOSIS — I10 Essential (primary) hypertension: Secondary | ICD-10-CM

## 2014-02-04 DIAGNOSIS — N186 End stage renal disease: Secondary | ICD-10-CM

## 2014-02-04 DIAGNOSIS — B1920 Unspecified viral hepatitis C without hepatic coma: Secondary | ICD-10-CM

## 2014-02-04 DIAGNOSIS — F1410 Cocaine abuse, uncomplicated: Secondary | ICD-10-CM

## 2014-02-04 DIAGNOSIS — R569 Unspecified convulsions: Secondary | ICD-10-CM

## 2014-02-04 MED ORDER — BOOST / RESOURCE BREEZE PO LIQD: 1.0000 | Freq: Three times a day (TID) | ORAL | Status: DC

## 2014-02-04 MED ORDER — HYDROCODONE-ACETAMINOPHEN 5-325 MG PO TABS
1.0000 | ORAL_TABLET | Freq: Four times a day (QID) | ORAL | Status: DC | PRN
  Administered 2014-02-04 – 2014-02-05 (×5): 2 via ORAL
  Filled 2014-02-04 (×4): qty 2

## 2014-02-04 MED ORDER — RENA-VITE PO TABS
1.0000 | ORAL_TABLET | Freq: Every day | ORAL | Status: DC
  Administered 2014-02-04: 1 via ORAL
  Filled 2014-02-04: qty 1

## 2014-02-04 NOTE — Progress Notes (Signed)
  I have seen and examined the patient, and reviewed the daily progress note by Josiah Lobo, MS4 and discussed the care of the patient with them. Please see my progress note from 02/04/2014 for further details regarding assessment and plan.    Signed:  Francesca Oman, DO 02/04/2014, 8:53 PM

## 2014-02-04 NOTE — Progress Notes (Signed)
Subjective: [name] was seen and examined today.  He feels well and ready for discharge to SNF.  Objective: Vital signs in last 24 hours: Filed Vitals:   02/03/14 1625 02/03/14 1930 02/04/14 0549 02/04/14 0610  BP: 121/62 102/82 140/69 102/53  Pulse: 66 42 66 94  Temp: 97.5 F (36.4 C) 98.4 F (36.9 C) 97.4 F (36.3 C) 97.6 F (36.4 C)  TempSrc: Oral     Resp: 20 18 18 16   Height:      Weight:  56.8 kg (125 lb 3.5 oz)    SpO2: 100% 97% 100% 99%   Weight change: -2.5 kg (-5 lb 8.2 oz) No intake or output data in the 24 hours ending 02/04/14 0755 General: in bed in NAD HEENT: pupils equal but NR, left eye with limited medial/upward/downward EOM , left eyelid slightly swollen and TTP (exam unchanged) Cardiac: RRR, no rubs, murmurs or gallops Pulm: clear to auscultation bilaterally, moving normal volumes of air Abd: soft, nontender, nondistended, BS present Ext: warm and well perfused, no pedal edema Neuro: alert and oriented X3, responding appropriately and able to move extremities  Lab Results: Basic Metabolic Panel:  Recent Labs Lab 01/29/14 0705 02/01/14 0749 02/01/14 0758 02/02/14 0836  NA 131* 137 133* 135*  K 4.5 4.7 4.3 5.5*  CL 90* 94* 105 93*  CO2 23 24  --  23  GLUCOSE 142* 153* 154* 95  BUN 81* 64* 58* 83*  CREATININE 9.60* 7.28* 7.40* 9.27*  CALCIUM 9.2 9.1  --  8.4  PHOS 2.7  --   --  6.6*   Liver Function Tests:  Recent Labs Lab 02/01/14 0749 02/02/14 0836  AST 36  --   ALT 48  --   ALKPHOS 61  --   BILITOT 0.3  --   PROT 8.2  --   ALBUMIN 3.5 2.9*   CBC:  Recent Labs Lab 02/01/14 0749 02/01/14 0758 02/02/14 0835  WBC 9.9  --  8.1  NEUTROABS 7.5  --   --   HGB 13.6 15.3 12.1*  HCT 40.4 45.0 35.1*  MCV 87.4  --  84.6  PLT 214  --  214   Studies/Results: No results found. Medications: I have reviewed the patient's current medications. Scheduled Meds: . aspirin EC  81 mg Oral Daily  . calcium acetate  1,334 mg Oral TID WC   . doxercalciferol  2 mcg Intravenous Q T,Th,Sa-HD  . feeding supplement (NEPRO CARB STEADY)  237 mL Oral TID WC  . heparin  5,000 Units Subcutaneous 3 times per day  . hydrALAZINE  25 mg Oral 3 times per day  . levETIRAcetam  1,000 mg Oral Daily  . predniSONE  60 mg Oral Q breakfast  . sodium chloride  3 mL Intravenous Q12H  . sodium chloride  3 mL Intravenous Q12H   Continuous Infusions:  PRN Meds:.sodium chloride, calcium acetate, HYDROcodone-acetaminophen, HYDROmorphone (DILAUDID) injection, ondansetron (ZOFRAN) IV, ondansetron, sodium chloride  Assessment/Plan: 58 year old man with PMH of ESRD on HD T/Th/Sat, HTN, cocaine abuse, recently discharged on 10/13 after dx of optic neuritis, on prednisone, presenting with worsening left eye pain and headache, and hypertensive urgency.   Accelerated Hypertension/Hypertensive urgency: BPs are now too low.  He was orthostatic today and symptomatic when having standing BPs taken.  I will keep him overnight and d/c hydralazine.  I think new EDW may be too low.   - discuss new EDW with Nephrology - d/c hydralazine - consider  adding amlodipine low dose - he can likely d/c to SNF tomorrow if stable after HD  Optic perineuritis, left: He says his pain is controlled. Eye exam is similar to prior admission without worsening of vision.   - continue prednisone 60mg  daily  - Ophthalmology has been informed he is re-hospitalized; he will need to follow-up with them at discharge - d/c dilaudid and increased norco prn for pain control (in anticipation of discharge)  ESRD: On HD T/Th/Sat at [facility].   - HD per nephrology - continue home phoslo   Hx of Anemia: Of chronic disease. Stable.  Protein-calorie malnutrition: Likely due to chronic medical disease. Denies melena, hematochezia, nightsweats.  - continue supplemental shakes   Hx of cocaine use: Denies use after discharge from the hospital. He was at SNF so he likely has not used.  - UDS  pending UOP  Hx of seizures: No recent seizure activity. Continue home [place].   DM2: Diet controlled. Last Hgb A1C 5.0% on 11/07/12.   Hepatitis C: Per Nephrology pt tested positive for hepatitis C on 02/28/12 but no record found in EPIC.  -Consider testing for hepatitis C again as he may be a candidate for tx   Diet:  renal DVT prophylaxis: heparin TID Raymond  Code: Full  Dispo: Disposition is deferred at this time, awaiting improvement of current medical problems.  Anticipated discharge in approximately 1-2 day(s).   The patient does have a current PCP [name] Kalata, MD) and does not need an [facility] hospital follow-up appointment after discharge.  The patient does not have transportation limitations that hinder transportation to clinic appointments.  .Services Needed at time of discharge: Y = Yes, Blank = No PT:   OT:   RN:   Equipment:   Other:     LOS: 3 days   Francesca Oman, DO 02/04/2014, 7:55 AM

## 2014-02-04 NOTE — Progress Notes (Signed)
OT Cancellation Note  Patient Details Name: [name] MRN: [id number] DOB: 28-Feb-1956   Cancelled Treatment:    Reason Eval/Treat Not Completed: Other (comment) Pt is Medicare and current D/C plan is back to SNF. No apparent immediate acute care OT needs, therefore will defer OT to SNF. If OT eval is needed please call Acute Rehab Dept. at [phone] or text page OT at [phone].     Almon Register [phone] 02/04/2014, 7:40 AM

## 2014-02-04 NOTE — Clinical Social Work Psychosocial (Signed)
Clinical Social Work Department BRIEF PSYCHOSOCIAL ASSESSMENT 02/04/2014  Patient:  [name],[name]     Account Number:  [id number]     Admit date:  02/01/2014  Clinical Social Worker:  Frederico Hamman  Date/Time:  02/04/2014 04:37 AM  Referred by:  Physician  Date Referred:  02/04/2014 Referred for  SNF Placement   Other Referral:   Interview type:  Patient Other interview type:    PSYCHOSOCIAL DATA Living Status:  FACILITY Admitted from facility:  [facility] of care:  [place] Primary support name:   Primary support relationship to patient:   Degree of support available:   Patient has a male friend patient has mentioned. Unknown level of actual support.    CURRENT CONCERNS Current Concerns  Post-Acute Placement   Other Concerns:    SOCIAL WORK ASSESSMENT / PLAN CSW talked with patient to confirm return to [facility]. Patient awake, alert and receptive to talking with CSW. Mr. Reyez reported that therapy is going well at facility and he mentioned how far he is able to ambulate. Patient is able to go outside with other residents and they walk off the facility property to smoke, which patient enjoys being able to do. Mr. Zietlow indicated that he will be returning to SNF at discharge.   Assessment/plan status:  Psychosocial Support/Ongoing Assessment of Needs Other assessment/ plan:   Information/referral to community resources:   None needed or requested at this time.    PATIENT'S/FAMILY'S RESPONSE TO PLAN OF CARE: Mr. Alverio receptive to talking with CSW and was very verbal regarding his stay so far at [facility].

## 2014-02-04 NOTE — Progress Notes (Signed)
[facility] Progress Note  Assessment/Plan: 1. Left eye pain - sec to optic perineuritis, per Ophthalmology and ID; on steroids, pain previously controlled; consider more liberal pain meds- shorter interval ? (something he could be discharged with).  2. ESRD - HD on TTS next HD on 10/20. Keep this new EDW of around 55; has been on no heparin HD - probably due to low Hgb in May when he was transfused 6 units- see anemia. - getting SQ heparin here- However, since Hgb is ok without heparin - favor continuing to hold after d/c though could get prn if needed 3. HTN/volume with hypertensive urgency at the time of readmission - BP 139/71 on Hydralazine 25 mg q8h; wt 55.2 kg s/p net UF 3.9 L Sat, BP much better 4. Anemia - Hgb 12.1, Aranesp on hold, continue weekly Fe; Seen by Dr. Juliann Mule in July minor B cell pol (17%) s/p BM bx worrisome for B cell lymphoproliferative process - without constitutional sx - rec observe given normal Hgb and plts -  Seen by Dr. Henrene Pastor May 2015 - EGD mild gastritis/only small colonic adenoma - due for surveillance colonoscopy 2017- rec stop hemoculting stools unless overt GIB- also rec continue Fe, ESA and prn transfusions 5. Sec HPT - Ca 8.4 (9.3 corrected), P 6.6, iPTH 221; Hectorol 2 mcg, Phoslo 3 with meals, added one with snacks, should improve. 6. Nutrition - Alb 2.9, renal diet, add multivitamin.  7. Hx Dm Type 2 per primary 8. Hepatitis C  9. Hx seizure disorder - on Keppra.  10. Hx cocaine abuse - 11. Disp - Dr. Mercy Moore IS NOT PCP - he is his nephrologist  Myriam Jacobson, PA-C [facility] Beeper [phone] 02/04/2014,9:31 AM  LOS: 3 days   Pt seen, examined and agree w A/P as above.  Kelly Splinter MD pager [phone]    cell [phone] 02/04/2014, 1:39 PM    Subjective:   Still with eye pain - eating ok; still cannot open eye except manually  Objective Filed Vitals:   02/04/14 0855 02/04/14 0900 02/04/14 0902 02/04/14 0905   BP:      Pulse:      Temp: 97.8 F (36.6 C)     TempSrc: Oral     Resp: 18     Height:      Weight:      SpO2: 100% 98% 100% 100%   Physical Exam General: resting; left eye closed, mild proptosis Heart: RRR Lungs: no wheezes or rales Abdomen: soft NT Extremities: no LE edema Dialysis Access: left upper AVF- patent  Dialysis Orders: TTS @ AF  58.5 kg 4 hrs 2K/2.25Ca 400/A1.5 No Heparin AVF @ LUA  Hectorol 2Aranesp 80 mcg 9/29 and Venofer 50 mg on Thurs.   Additional Objective Labs: Basic Metabolic Panel:  Recent Labs Lab 01/29/14 0705 02/01/14 0749 02/01/14 0758 02/02/14 0836  NA 131* 137 133* 135*  K 4.5 4.7 4.3 5.5*  CL 90* 94* 105 93*  CO2 23 24  --  23  GLUCOSE 142* 153* 154* 95  BUN 81* 64* 58* 83*  CREATININE 9.60* 7.28* 7.40* 9.27*  CALCIUM 9.2 9.1  --  8.4  PHOS 2.7  --   --  6.6*   Liver Function Tests:  Recent Labs Lab 01/29/14 0705 02/01/14 0749 02/02/14 0836  AST  --  36  --   ALT  --  48  --   ALKPHOS  --  61  --   BILITOT  --  0.3  --   PROT  --  8.2  --   ALBUMIN 3.0* 3.5 2.9*  CBC:  Recent Labs Lab 01/29/14 0705 02/01/14 0749 02/01/14 0758 02/02/14 0835  WBC 9.6 9.9  --  8.1  NEUTROABS  --  7.5  --   --   HGB 11.4* 13.6 15.3 12.1*  HCT 33.4* 40.4 45.0 35.1*  MCV 83.7 87.4  --  84.6  PLT 185 214  --  214   Cardiac Enzymes:  Recent Labs Lab 02/01/14 0749 02/01/14 1405 02/01/14 1852  TROPONINI <0.30 <0.30 <0.30   CBG:  Recent Labs Lab 01/28/14 2124  GLUCAP 267*  Medications:   . aspirin EC  81 mg Oral Daily  . calcium acetate  1,334 mg Oral TID WC  . doxercalciferol  2 mcg Intravenous Q T,Th,Sa-HD  . feeding supplement (NEPRO CARB STEADY)  237 mL Oral TID WC  . heparin  5,000 Units Subcutaneous 3 times per day  . hydrALAZINE  25 mg Oral 3 times per day  . levETIRAcetam  1,000 mg Oral Daily  . predniSONE  60 mg Oral Q breakfast  . sodium chloride  3 mL Intravenous Q12H  . sodium chloride  3 mL Intravenous  Q12H

## 2014-02-04 NOTE — Progress Notes (Signed)
NUTRITION FOLLOW-UP  Pt meets criteria for SEVERE MALNUTRITION in the context of chronic illness as evidenced by severe fat and muscle mass loss.  DOCUMENTATION CODES Per approved criteria  -Severe malnutrition in the context of chronic illness   INTERVENTION: Provide Resource Breeze po TID, each supplement provides 250 kcal and 9 grams of protein.  Discontinue Nepro Shake.  NUTRITION DIAGNOSIS: Malnutrition related to chronic illness as evidenced by severe fat and muscle mass loss; ongoing  Goal: Pt to meet >/= 90% of their estimated nutrition needs; progressing  Monitor:  PO intake, weight trends, labs, I/O's  58 y.o. male  Admitting Dx: Hypertensive urgency  ASSESSMENT: Pt with PMH of ESRD on HD T/Th/Sat, HTN, substance abuse (cocaine), and DM2, who was discharged from the [facility] on 01/29/14 after hospitalization and treatment for left optic neuritis who presents to the ED with worsening left eye pain and left sided headache.   10/6-Pt is familiar to this RD. Pt reports having a good appetite currently and PTA. Pt reports he has been eating well at home with consumption of 3 full meals a day. Pt with no recent weight loss. Pt is currently NPO. Pt reports he does not like Nepro, but likes [employer]. Will order [employer] once diet advances. Will order snack for pt once diet advances.   10/19- Pt has been advanced to a renal diet. Pt with good appetite. Pt has been refusing Nepro Shake as he reports he does not like it. Pt likes [employer]. Will Order.   Height: Ht Readings from Last 1 Encounters:  02/01/14 5\' 9"  (1.753 m)    Weight: Wt Readings from Last 1 Encounters:  02/03/14 125 lb 3.5 oz (56.8 kg)   BMI:  Body mass index is 18.48 kg/(m^2).   Re-Estimated Nutritional Needs: Kcal: 1850 - 2100  Protein: at least 87 g daily  Fluid: 1.2 liters  Skin: intact  Diet Order: Renal with 1200 fluids  No intake or output data in the 24 hours ending 02/04/14  1456  Last BM: 10/18  Labs:   Recent Labs Lab 01/29/14 0705 02/01/14 0749 02/01/14 0758 02/02/14 0836  NA 131* 137 133* 135*  K 4.5 4.7 4.3 5.5*  CL 90* 94* 105 93*  CO2 23 24  --  23  BUN 81* 64* 58* 83*  CREATININE 9.60* 7.28* 7.40* 9.27*  CALCIUM 9.2 9.1  --  8.4  PHOS 2.7  --   --  6.6*  GLUCOSE 142* 153* 154* 95    CBG (last 3)  No results found for this basename: GLUCAP,  in the last 72 hours  Scheduled Meds: . aspirin EC  81 mg Oral Daily  . calcium acetate  1,334 mg Oral TID WC  . doxercalciferol  2 mcg Intravenous Q T,Th,Sa-HD  . feeding supplement (NEPRO CARB STEADY)  237 mL Oral TID WC  . heparin  5,000 Units Subcutaneous 3 times per day  . levETIRAcetam  1,000 mg Oral Daily  . multivitamin  1 tablet Oral QHS  . predniSONE  60 mg Oral Q breakfast  . sodium chloride  3 mL Intravenous Q12H  . sodium chloride  3 mL Intravenous Q12H    Continuous Infusions:   Past Medical History  Diagnosis Date  . Hypertension   . Anemia, chronic disease   . CHF (congestive heart failure)     diastolic.  EF 60 - 65% per [facility] eco 11/2011  . Cocaine abuse     mentioned in notes  from [place]  . Hepatitis C antibody test positive     was HIV negative, 02/28/12  . Hepatitis B core antibody positive     03/01/10  . Positive QuantiFERON-TB Gold test     11/2011  . Helicobacter pylori gastritis     not defined if this was treated  . Polyp of colon, adenomatous     May 2012.  Dr Trenton Founds in [place]  . Shortness of breath   . Hematochezia   . Head injury, closed, with concussion   . History of blood transfusion     "last one was 2 days ago" (11/09/2012)  . Type II diabetes mellitus     on oral pills only  . Arthritis     "right shoulder" (11/09/2012)  . Seizure disorder     questionable history of - will need to clarify with PCP  . Headache(784.0)     "q other day" (11/09/2012)  . ESRD on hemodialysis since 2012    Patient says he started HD in 2012.  ESRD  was due to "drugs", primarily used cocaine.  Has 3-5 year hx of HTN, no DM.  Gets HD on TTS schedule at [place].  [name] from [place] and recently moved here.    . Hypercalcemia   . Hyperpotassemia   . Seizures     Past Surgical History  Procedure Laterality Date  . Shoulder open rotator cuff repair Right   . Total knee arthroplasty Left   . Bascilic vein transposition  03/07/2012    Procedure: BASCILIC VEIN TRANSPOSITION;  Surgeon: Conrad LeRoy, MD;  Location: [place];  Service: Vascular;  Laterality: Left;  First Stage  . Insertion of dialysis catheter      right chest  . Bascilic vein transposition Left 05/31/2012    Procedure: BASCILIC VEIN TRANSPOSITION;  Surgeon: Conrad Manitou, MD;  Location: [place];  Service: Vascular;  Laterality: Left;  Left 2nd Stage Basilic Vein Transposition with gortex graft revision using 63mmx10cm graft  . Shoulder open rotator cuff repair Right   . [name] capsule study N/A 08/27/2013    Procedure: [name] CAPSULE STUDY;  Surgeon: Milus Banister, MD;  Location: [place];  Service: Endoscopy;  Laterality: N/A;    Kallie Locks, MS, RD, LDN Pager # [phone] After hours/ weekend pager # [phone]

## 2014-02-04 NOTE — Progress Notes (Signed)
Subjective: Mr. Brodhead was seen in his room this afternoon. He was still having some L eye soreness and pain and is not as dizzy as before when he stands up. He would like to go back to SNF today.   Objective: Vital signs in last 24 hours: Filed Vitals:   02/04/14 0855 02/04/14 0900 02/04/14 0902 02/04/14 0905  BP:      Pulse:      Temp: 97.8 F (36.6 C)     TempSrc: Oral     Resp: 18     Height:      Weight:      SpO2: 100% 98% 100% 100%   Weight change: -2.5 kg (-5 lb 8.2 oz) No intake or output data in the 24 hours ending 02/04/14 1144  General: resting in bed in NAD, sleeping   HEENT: Less periorbital swelling on L compared to R from initial presentation; complete ptosis of [name]; binocular diplopia, EOM in L eye continue to be limited and L abduction may be more grossly intact; L and R eye pupils are somewhat reactive to light today and R eye pupil is constricted compared to the previous days  Cardiac: RRR, no rubs, murmurs or gallops   Pulm: clear to auscultation bilaterally, moving normal volumes of air  Abd: soft, nontender, nondistended, BS present  Ext: warm and well perfused, no pedal edema.  Neuro: alert and oriented X3, responding appropriately, following commands. Gait not assessed today.   Lab Results: None  Medications:  Scheduled Meds: . aspirin EC  81 mg Oral Daily  . calcium acetate  1,334 mg Oral TID WC  . doxercalciferol  2 mcg Intravenous Q T,Th,Sa-HD  . feeding supplement (NEPRO CARB STEADY)  237 mL Oral TID WC  . heparin  5,000 Units Subcutaneous 3 times per day  . hydrALAZINE  25 mg Oral 3 times per day  . levETIRAcetam  1,000 mg Oral Daily  . multivitamin  1 tablet Oral QHS  . predniSONE  60 mg Oral Q breakfast  . sodium chloride  3 mL Intravenous Q12H  . sodium chloride  3 mL Intravenous Q12H   Continuous Infusions:  PRN Meds:.sodium chloride, calcium acetate, HYDROcodone-acetaminophen, ondansetron (ZOFRAN) IV, ondansetron, sodium  chloride  Assessment/Plan: Principal Problem:   Hypertensive urgency Active Problems:   Diabetes mellitus without complication   ESRD on hemodialysis   Seizure disorder   Cocaine abuse   Optic neuritis, left   Protein-calorie malnutrition, severe   Orthostatic hypotension   Accelerated hypertension  Mr. Fick is a 58 year old man with PMH of ESRD on HD T/Th/Sat, HTN, cocaine abuse, recently discharged on 10/13 after dx of optic perineuritis, on prednisone, presenting with worsening left eye pain and headache and hypertensive urgency.   Accelerated Hypertension/Hypertensive urgency: BPs in the 38-756E systolic today and hydralazine is continued to be held and patient feels less dizzy now. His new EDW is 55 Kg. Yesterday afternoon, RN informed that his standing BP was 104/63 and patient reported feeling dizzy and lightheaded.  However, yesterday, he was able to walk with PT down the hallways but did experience lightheadedness upon returning to the room.  - Continue holding hydralazine for now  - ask RN to check standing pressures  - will not discharge on anithypertensive   Optic perineuritis, left: Less swelling compared to previous admission, but still reports of inability to open [name] and binocular diplopia. Based on MRI/MRA of head and orbit on 10/05, no evidence of cavernous sinus  thrombosis was seen and mild distention of L optic nerve sheath was noted. According to Dr. Posey Pronto, the significant inflammation of the perineural sheath of the L eye is likely consistent with optic perineuritis, a rare variant of orbital pseudotumor. ACE level is normal, RPR is NR, ANCA antibodies negative. Dr. Tessa Lerner had mentioned a case report of recurrent optic perineuritis after intranasal cocaine abuse (Coppens et al. Neuro-Ophthalmology 30(2): [phone]) though patient reports smoking cocaine and very occasional use of it intranasally. Patient's MRI had noted minimal edema in the base of L maxillary sinus  and CT maxillary from 10/4 noted a soft density at L maxilla eye with destructive changes of posterior molar, suggesting peridontal inflammation and with a further discussion with radiologist on 10/5, there are likely other abnormalities in his oral cavity including dental caries in his R molars and loose dental roots and given this, one can consider this serving as nidus of infection for his presentation.  - informed Dr. Posey Pronto that patient is admitted  - continue 60 mg PO prednisone - continue Norco/Vicodin 5-325 mg q4h PRN pain  - discontinue 0.5 Dilaudid IV q8h PRN pain   Diabetes Mellitus: Hgb A1c 5.0 July 2014. Serum BG 165. [name] not currently on treatment. Has not needed SSI after initiation of steroids   ESRD on hemodialysis, TThS schedule: received HD yesterday, 10/17 but stopped early due to cramps. - HD per nephrology  - appreciate nephro's involvement   Cocaine abuse: patient has a long history of cocaine abuse with recent use being about three days ago before presentation on last admission. He admits to no use in between the discharge time between this and previous admission.  - counseled on cessation   Seizure disorder: No seizure activity during last hospitalization or during this admission.  - continue home Keppra   VTE ppx: heparin   Diet: renal diet with 1200 mL fluid restriction   Disposition: SNF   This is a Careers information officer Note.  The care of the patient was discussed with Dr. Megan Salon and Dr. Hayes Ludwig and the assessment and plan formulated with their assistance. Please see their attached note for official documentation of the daily encounter.   LOS: 3 days   Josiah Lobo, Med Student 02/04/2014, 11:44 AM

## 2014-02-04 NOTE — Discharge Summary (Signed)
Name: [name] MRN: [id number] DOB: [birth year] 58 y.o. PCP: Windy Kalata, MD  Date of Admission: 02/01/2014  7:16 AM Date of Discharge: 02/05/2014 Attending Physician: Aldine Contes, MD  Discharge Diagnosis: Principal Problem:   Hypertensive urgency Active Problems:   Diabetes mellitus without complication   ESRD on hemodialysis   Seizure disorder   Cocaine abuse   Optic neuritis, left   Protein-calorie malnutrition, severe   Orthostatic hypotension   Accelerated hypertension  Discharge Medications:   Medication List         amLODipine 5 MG tablet  Commonly known as:  NORVASC  Take one tablet per mouth on non dialysis days, on Monday, Wednesday, Friday, Sunday.     calcium acetate 667 MG capsule  Commonly known as:  PHOSLO  Take 1 capsule (667 mg total) by mouth as needed (with snacks).     calcium acetate 667 MG capsule  Commonly known as:  PHOSLO  Take 667-1,334 mg by mouth See admin instructions. Take 1334 mg by mouth three times a day with meals and take 667mg  a day with snacks     feeding supplement (RESOURCE BREEZE) Liqd  Take 1 Container by mouth 3 (three) times daily between meals.     HYDROcodone-acetaminophen 5-325 MG per tablet  Commonly known as:  NORCO/VICODIN  Take 1-2 tablets by mouth every 6 (six) hours as needed for moderate pain.     levETIRAcetam 1000 MG tablet  Commonly known as:  KEPPRA  Take 1,000 mg by mouth daily.     predniSONE 20 MG tablet  Commonly known as:  DELTASONE  Take 3 tablets (60 mg total) by mouth daily with breakfast.     TUBERCULIN PPD ID  Inject 0.1 mLs into the skin daily. For 10 days, to be given on evening  shift       Disposition and follow-up:   RandallRandall [name] was discharged from [facility] in stable condition.  At the hospital follow up visit please address:  1. How well have his blood pressures with his new hypertension regimen? Is he still experiencing lightheadedness  upon standing?   2. How well have his glucose levels been since he has been on daily steroids?   3. Status of his left eye.   2. Labs / imaging needed at time of follow-up: Glucose check, Hep C workup   3. Pending labs/ test needing follow-up: None.   Follow-up Appointments: Follow-up Information   Follow up with Lamonte Sakai, MD On 02/08/2014. [facility] Follow-up at 4:30 p.m. PLEASE SHOW UP AT 4:15 P.M )    Specialty:  Ophthalmology   Contact information:   [place] [id number] [phone]       Discharge Instructions: [name], you were admitted because your blood pressure was very high. During your last dialysis, you had 4 L of fluids removed and your pressures have been at range since you have been at the hospital even without taking any medication for your blood pressures. We would like you to continue on 60 mg of prednisone and see Dr. Posey Pronto of Pediatric Opthalmology this Friday, Oct 23 at 4:30 p.m.   A few recommendations:  1) CONTINUE 60 mg prednisone  2) You may continue to wear the eye patch to help with preventing yourself from seeing double vision.  3) STOP taking hydralazine, your medication to help your lower your blood pressure.  4) AVOID use of cocaine or other drugs  Discharge Instructions  Diet - low sodium heart healthy    Complete by:  As directed      Increase activity slowly    Complete by:  As directed           Consultations: Treatment Team:  Louis Meckel, MD  Procedures Performed:  Dg Chest 1 View  02/01/2014   CLINICAL DATA:  Headache.  EXAM: CHEST - 1 VIEW  COMPARISON:  01/19/2014  FINDINGS: Heart is upper limits normal in size. Lungs are clear. No effusions or acute bony abnormality.  IMPRESSION: No active disease.   Electronically Signed   By: Rolm Baptise M.D.   On: 02/01/2014 08:55   Ct Head Wo Contrast  02/01/2014   CLINICAL DATA:  Diffuse headache, hypertension, and left eye swelling. Prior stroke with  residual left-sided weakness.  EXAM: CT HEAD WITHOUT CONTRAST  CT MAXILLOFACIAL WITHOUT CONTRAST  TECHNIQUE: Multidetector CT imaging of the head and maxillofacial structures were performed using the standard protocol without intravenous contrast. Multiplanar CT image reconstructions of the maxillofacial structures were also generated.  COMPARISON:  Brain MRI 01/21/2014. Head CT 01/19/2014. Maxillofacial CT 01/20/2014.  FINDINGS: CT HEAD FINDINGS  There is no evidence of acute cortical infarct, intracranial hemorrhage, mass, midline shift, or extra-axial fluid collection. Ventricles and sulci are within normal limits for age. Patchy and confluent periventricular and subcortical white matter hypodensities are similar to the prior studies and nonspecific but compatible with moderate chronic small vessel ischemic disease. Mastoid air cells and visualized paranasal sinuses are clear. Moderate carotid siphon calcification is noted. No skull fracture is seen.  CT MAXILLOFACIAL FINDINGS  The globes appear intact. Mild left proptosis is not excluded. The extraocular muscles and lacrimal glands are unremarkable. No significant inflammatory changes are identified in the orbital fat. No significant preseptal inflammatory change/ soft tissue thickening is identified. There is no orbital mass. No fluid collection is identified. There is trace left maxillary sinus mucosal thickening. There is poor dentition with multiple periapical lucencies, most prominently involving the left maxillary second molar.  IMPRESSION: 1. No evidence of acute intracranial abnormality. 2. Moderate chronic small vessel ischemic disease. 3. No significant inflammatory changes involving the orbits. 4. Periodontal disease.   Electronically Signed   By: Logan Bores   On: 02/01/2014 09:07   Ct Head Wo Contrast  01/19/2014   CLINICAL DATA:  Acute headache and the left sided blurry vision. Lethargy. Initial encounter.  EXAM: CT HEAD WITHOUT CONTRAST   TECHNIQUE: Contiguous axial images were obtained from the base of the skull through the vertex without intravenous contrast.  COMPARISON:  None.  FINDINGS: Scattered periventricular hypodensities, most conspicuous about the anterior horn of the right lateral ventricle (image 19, series 201), compatible with microvascular ischemic disease. Gray-white differentiation is otherwise well maintained without CT evidence of acute large territory infarct. No intraparenchymal or extra-axial mass or hemorrhage. Normal size a configuration of the ventricles and basilar cisterns. No midline shift. Limited visualization of the paranasal sinuses and mastoid air cells is normal. Intracranial atherosclerosis. Regional soft tissues are normal. No displaced calvarial fracture.  IMPRESSION: Mild microvascular ischemic disease without acute intracranial process.   Electronically Signed   By: Sandi Mariscal M.D.   On: 01/19/2014 10:28   [name] Head Wo Contrast  01/21/2014   CLINICAL DATA:  Diabetic with LEFT proptosis.  EXAM: MRA HEAD WITHOUT CONTRAST  TECHNIQUE: Angiographic images of the Circle of Willis were obtained using MRA technique without intravenous contrast.  COMPARISON:  CT head 01/19/2014.  CT maxillofacial 01/20/2014. [name] of the head and orbits reported separately.  FINDINGS: The LEFT internal carotid artery is widely patent.  The RIGHT internal carotid artery demonstrates long segment 50% stenosis in its inferior cavernous segment. There is a similar 50% stenosis in the cavernous/supraclinoid junction. Both ICA termini are widely patent.  Basilar artery widely patent with vertebrals both contributing, RIGHT dominant. No proximal stenosis of the RIGHT or LEFT middle cerebral artery. No MCA branch occlusion.  Both posterior cerebral arteries widely patent in their proximal segments. Some mild irregularity in the LEFT P3 PCA segment distal to the ambient cistern.  Moderate irregularity of the RIGHT A1 ACA. The LEFT A1 ACA is  robust and supplies both anterior cerebrals as an azygos A2.  IMPRESSION: Non stenotic atheromatous change of the skull base and cavernous RIGHT internal carotid artery, likely manifestation of diabetic type atherosclerotic disease. Similar moderate irregularity intracranially affecting the proximal RIGHT anterior cerebral.  No flow reducing lesion of the carotid or basilar arteries is identified.   Electronically Signed   By: Rolla Flatten M.D.   On: 01/21/2014 15:41   [name] Brain Wo Contrast  01/21/2014   CLINICAL DATA:  Left eye pain and blurred vision for 3 days. Left eye drainage. Left-sided frontal headache. Diabetes and dialysis.  EXAM: MRI HEAD AND ORBITS WITHOUT CONTRAST  TECHNIQUE: Multiplanar, multiecho pulse sequences of the brain and surrounding structures were obtained without intravenous contrast. Multiplanar, multiecho pulse sequences of the orbits and surrounding structures were obtained including fat saturation techniques, without intravenous contrast administration.  : COMPARISON:  CT head 01/19/2014  FINDINGS: MRI HEAD FINDINGS  Mild atrophy. Moderate chronic ischemic changes throughout the cerebral white matter. 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 Extensive chronic ischemia in the pons. Cerebellum intact.  Negative for acute infarct.  Chronic hemorrhage in the right frontal lobe along the surface of the brain possibly due to prior subarachnoid hemorrhage or trauma. No mass or edema identified.  MRI ORBITS FINDINGS  Mild left-sided proptosis. The globe is normal in shape and size and is symmetric. Extraocular muscles are normal. No edema in the orbital fat.  The optic nerve sheath is mildly dilated on the left. Question papilledema. Optic nerve is normal in signal bilaterally. Lacrimal gland is normal. No mass is present within the orbit. No edema in the eyelid to suggest orbital cellulitis. Negative for abscess. Paranasal sinuses reveal minimal mucosal edema in the base  of left maxillary sinus.  IMPRESSION: Atrophy and moderately severe chronic microvascular ischemia. No acute infarct.  Chronic hemorrhage right frontal lobe, question prior trauma  Mild left-sided proptosis without mass lesion. Mildly distended optic nerve sheath on the left, question papilledema. No underlying mass edema or abscess identified   Electronically Signed   By: Franchot Gallo M.D.   On: 01/21/2014 15:51   Ct Maxillofacial W/cm  01/20/2014   CLINICAL DATA:  New onset of left orbital swelling 4 days ago without reported trauma ; dialysis dependent renal failure ; subsequent encounter  EXAM: CT MAXILLOFACIAL WITH CONTRAST  TECHNIQUE: Multidetector CT imaging of the maxillofacial structures was performed with intravenous contrast. Multiplanar CT image reconstructions were also generated. A small metallic BB was placed on the right temple in order to reliably differentiate right from left.  CONTRAST:  95mL OMNIPAQUE IOHEXOL 300 MG/ML  SOLN intravenous  COMPARISON:  Noncontrast CT scan of the brain of January 19, 2014  FINDINGS: The globes are intact. There is no significant pre or  postseptal edema. The periorbital soft tissues exhibit no evidence of abscess nor significant edema. The male are soft tissues are unremarkable.  The paranasal sinuses exhibit no air-fluid levels. There is a small amount of mucoperiosteal thickening in the left maxillary sinus. There is lucency within the posterior aspect of the left maxilla with small amount of increased density in the adjacent soft tissues along the lateral aspect of the alveolar ridge. A tiny focus of gas is demonstrated on image 42. The posterior-most molar appears to be partially destroyed. The temporomandibular joints exhibit no acute abnormality.  IMPRESSION: 1. There are no findings to suggest significant inflammatory changes of the orbit. The pre and postseptal soft tissues are fairly symmetric with those on the right. 2. There is abnormal soft tissue  density along the external surface of the left maxilla posteriorly with destructive changes of a posterior molar. The findings suggest periodontal inflammation without discrete abscess. 3. No significant inflammatory change within the paranasal sinuses is demonstrated.   Electronically Signed   By: David  Martinique   On: 01/20/2014 08:49   Dg Chest Portable 1 View  01/19/2014   CLINICAL DATA:  Dyspnea for 2 days. History of hypertension, diabetes and smoking. Initial encounter.  EXAM: PORTABLE CHEST - 1 VIEW  COMPARISON:  01/02/2013; 12/19/2012; 05/06/2012; 03/02/2012  FINDINGS: Grossly unchanged enlarged cardiac silhouette. Overall improved aeration of the lungs with minimal residual right mid lung heterogeneous opacities, stable since the 02/2012 examination and favored to represent atelectasis or scar. No new focal airspace opacities. No pleural effusion pneumothorax. No evidence of edema. Postsurgical change of the right glenohumeral joint.  IMPRESSION: 1.  No acute cardiopulmonary disease. 2. Improved aerated of lungs with persistent right mid lung atelectasis / scar.   Electronically Signed   By: Sandi Mariscal M.D.   On: 01/19/2014 08:12   Ct Maxillofacial Wo Cm  02/01/2014   CLINICAL DATA:  Diffuse headache, hypertension, and left eye swelling. Prior stroke with residual left-sided weakness.  EXAM: CT HEAD WITHOUT CONTRAST  CT MAXILLOFACIAL WITHOUT CONTRAST  TECHNIQUE: Multidetector CT imaging of the head and maxillofacial structures were performed using the standard protocol without intravenous contrast. Multiplanar CT image reconstructions of the maxillofacial structures were also generated.  COMPARISON:  Brain MRI 01/21/2014. Head CT 01/19/2014. Maxillofacial CT 01/20/2014.  FINDINGS: CT HEAD FINDINGS  There is no evidence of acute cortical infarct, intracranial hemorrhage, mass, midline shift, or extra-axial fluid collection. Ventricles and sulci are within normal limits for age. Patchy and confluent  periventricular and subcortical white matter hypodensities are similar to the prior studies and nonspecific but compatible with moderate chronic small vessel ischemic disease. Mastoid air cells and visualized paranasal sinuses are clear. Moderate carotid siphon calcification is noted. No skull fracture is seen.  CT MAXILLOFACIAL FINDINGS  The globes appear intact. Mild left proptosis is not excluded. The extraocular muscles and lacrimal glands are unremarkable. No significant inflammatory changes are identified in the orbital fat. No significant preseptal inflammatory change/ soft tissue thickening is identified. There is no orbital mass. No fluid collection is identified. There is trace left maxillary sinus mucosal thickening. There is poor dentition with multiple periapical lucencies, most prominently involving the left maxillary second molar.  IMPRESSION: 1. No evidence of acute intracranial abnormality. 2. Moderate chronic small vessel ischemic disease. 3. No significant inflammatory changes involving the orbits. 4. Periodontal disease.   Electronically Signed   By: Logan Bores   On: 02/01/2014 09:07   [name] Orbits Wo Cm  01/21/2014  CLINICAL DATA:  Left eye pain and blurred vision for 3 days. Left eye drainage. Left-sided frontal headache. Diabetes and dialysis.  EXAM: MRI HEAD AND ORBITS WITHOUT CONTRAST  TECHNIQUE: Multiplanar, multiecho pulse sequences of the brain and surrounding structures were obtained without intravenous contrast. Multiplanar, multiecho pulse sequences of the orbits and surrounding structures were obtained including fat saturation techniques, without intravenous contrast administration.  : COMPARISON:  CT head 01/19/2014  FINDINGS: MRI HEAD FINDINGS  Mild atrophy. Moderate chronic ischemic changes throughout the cerebral white matter. 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 Extensive chronic ischemia in the pons. Cerebellum intact.  Negative for acute infarct.   Chronic hemorrhage in the right frontal lobe along the surface of the brain possibly due to prior subarachnoid hemorrhage or trauma. No mass or edema identified.  MRI ORBITS FINDINGS  Mild left-sided proptosis. The globe is normal in shape and size and is symmetric. Extraocular muscles are normal. No edema in the orbital fat.  The optic nerve sheath is mildly dilated on the left. Question papilledema. Optic nerve is normal in signal bilaterally. Lacrimal gland is normal. No mass is present within the orbit. No edema in the eyelid to suggest orbital cellulitis. Negative for abscess. Paranasal sinuses reveal minimal mucosal edema in the base of left maxillary sinus.  IMPRESSION: Atrophy and moderately severe chronic microvascular ischemia. No acute infarct.  Chronic hemorrhage right frontal lobe, question prior trauma  Mild left-sided proptosis without mass lesion. Mildly distended optic nerve sheath on the left, question papilledema. No underlying mass edema or abscess identified   Electronically Signed   By: Franchot Gallo M.D.   On: 01/21/2014 15:51   Admission HPI: [name]. [name] is a 58 year old man with PMH of ESRD on HD T/Th/Sat, HTN, substance abuse (cocaine), and DM2, who was discharged from the [facility] on 01/29/14 after hospitalization and treatment for left optic neuritis who presents to the ED with worsening left eye pain and left sided headache. He was discharged to the [facility] and underwent routine hemodialyses yesterday. He states that his eye pain and headache had improved but worsened about midnight prior to his presentation. He describes a 9/10 headache. His left eye continues to be shut with impairment of his EOM, just as seen prior to his discharge on Tuesday. He reports full compliance with prednisone which was prescribed to him at the time of his discharge with last dose yesteday. He was discharged with no antihypertensives due to symptomatic orthostatic hypotension.   In the ED his BP was  elevated to 243/91. His poc troponin was elevated to 0.13 and his EKG had changes, ST elevation in the anterior leads concerning for LAD infarct. Cardiology, Dr. Martinique, was promptly consulted, and felt like the EKG changes were most likely consistent with the hypertensive state and did not recommend that a code STEMI be called. The patient remained chest pain free with no SOB, back pain, speech changes, or new focal Neurological changes. A CT head without contrast and a CT maxillofacial without contrast revealed no evidence of acute intracranial abnormality, no significant inflammatory changes involving the orbits, and periodontal disease. He was given labetalol 10mg  IV once with not much change in his BP. He was given hydralazine 10mg  IV x2 and his BP decreased to 188/71. He was also given Dilaudid 1mg  V for the headache with improvement of his eye pain and headache. The IMTS was called for his hospitalization for the treatment of his hypertensive urgency.  Hospital Course by problem list: Accelerated Hypertension/Hypertensive urgency: He initially presented to the ED with blood pressure of  243/91 and worsening headache and L eye pain. At the ED, he was given hydralazine 10mg  IV x2 and his BP decreased to 188/71. He was restarted on 25 mg PO hydralazine and his pressure were in the 150s-170/80s. After his HD session on 10/17, he had 4 L of fluid pulled off and his EDW was decreased to 55Kg. Following this session, his pressures were in the 737-106Y systolic and subsequently, hydralazine was held. He tolerated ambulation with PT on 10/18 but experienced some lightheadedness upon returning to his room. His vitals were consistent with orthostatic and reported lightheadedness upon standing. On day of discharge, his lying BP was 111/54, sitting BP was 98/53, standing was 83/40 at 0 minutes, and standing BP was 108/56 at 3 minutes. Given that his standing BP returned almost to his lying BP level, it is likely that  this autonomic dysfunction and it is is a chronic issue. He was advised to get up slowly and wear compression stockings. On day of discharge, he underwent HD and had 5 L removed. His blood pressure was highly variable during this and the previous hospitalization and was difficult to achieve good control. As per previous records, he was initially on 100 mg hydralazine and irbesartan but when he presented to [place] he was only on hydralazine. The hydralazine was discontinued due to concerns for orthostasis but temporarily restarted during this hospitalization due to his hypertensive crisis. Given that he undergoes HD on [facility] and to prevent another hypertensive crisis in the future, he will be prescribed 5 mg of amlodipine on non-HD days (Monday, Wednesday, Friday, and Sunday). His BP will be monitored in dialysis on a frequent basis as well as with his new PCP at the [facility] and [facility].   Optic perineuritis, left: He presented on 10/3 with progressive L eye pain, swelling, opthalmoplegia, and double vision and was diagnosed with L optic perineuritis per MRI findings and per Ophthalmologist assessment--see prior d/c summary for more details. He reported increase in eye pain prior to this current hospitalization and was given Dilaudid for pain control. His eye exam was similar to prior exams without worsening of vision. He was continued on 60 mg prednisone daily and Dr. Posey Pronto of Ophthalmology was informed. Ms. Slone will follow up with Dr. Posey Pronto this Friday (02/08/14) at 4:30 p.m and will resume his Norco/Vicodin at [facility] for pain control.   ESRD on hemodialysis, [facility] schedule: He rceived inpatient dialysis and was seen by Dr. Posey Pronto an Dr. Jonnie Finner in Nephrology. On his 10/17 HD session, he had 4 L taken off and his new EDW was dropped to 55. On 10/20 HD session, he had over 5 L taken off. He will follow up with his HD at [facility] after discharge.   Hx of Anemia: This was likely due to chronic disease and  ESRD. He did not require any transfusions during hospitalization.   Protein-calorie malnutrition: Likely due to chronic medical disease. He denied melena, hematochezia, or night sweats. He was continued on supplemental shakes.  Hx of cocaine use: She denied use after discharge from the hospital and at the SNF. We suspected his history of cocaine use played a role in his hypertension.   Hx of seizures: No seizure activity was observed during this hospitalization. He was continued on home Keppra.   DM2: Last Hgb A1C 5.0% on 11/07/12. [name] on diet control with stable  CBGs.   Hepatitis C: According to Nephrology, patient tested positive for hepatitis C on 02/28/12 but no record found in EPIC. We recommend for PCP to consider testing for hepatitis C again as he may be a candidate for treatment.   Discharge Vitals:   BP 117/80  Pulse 77  Temp(Src) 98 F (36.7 C) (Oral)  Resp 18  Ht 5\' 9"  (1.753 m)  Wt 126 lb 1.7 oz (57.2 kg)  BMI 18.61 kg/m2  SpO2 98%   Discharge Labs:  Results for orders placed during the hospital encounter of 02/01/14 (from the past 24 hour(s))  CBC     Status: Abnormal   Collection Time    02/05/14  7:20 AM      Result Value Ref Range   WBC 9.8  4.0 - 10.5 K/uL   RBC 3.87 (*) 4.22 - 5.81 MIL/uL   Hemoglobin 11.3 (*) 13.0 - 17.0 g/dL   HCT 32.6 (*) 39.0 - 52.0 %   MCV 84.2  78.0 - 100.0 fL   MCH 29.2  26.0 - 34.0 pg   MCHC 34.7  30.0 - 36.0 g/dL   RDW 15.4  11.5 - 15.5 %   Platelets 194  150 - 400 K/uL  RENAL FUNCTION PANEL     Status: Abnormal   Collection Time    02/05/14  7:20 AM      Result Value Ref Range   Sodium 137  137 - 147 mEq/L   Potassium 4.1  3.7 - 5.3 mEq/L   Chloride 94 (*) 96 - 112 mEq/L   CO2 21  19 - 32 mEq/L   Glucose, Bld 220 (*) 70 - 99 mg/dL   BUN 108 (*) 6 - 23 mg/dL   Creatinine, Ser 10.30 (*) 0.50 - 1.35 mg/dL   Calcium 8.5  8.4 - 10.5 mg/dL   Phosphorus 4.1  2.3 - 4.6 mg/dL   Albumin 2.9 (*) 3.5 - 5.2 g/dL   GFR calc non Af  Amer 5 (*) >90 mL/min   GFR calc Af Amer 6 (*) >90 mL/min   Anion gap 22 (*) 5 - 15    Signed: Blain Pais, MD PGY3, IMTS 02/05/2014, 2:56 PM    Services Ordered on Discharge: None Equipment Ordered on Discharge: None

## 2014-02-04 NOTE — Progress Notes (Signed)
Pt seen and examined with [facility] and Dr. Redmond Pulling. Please refer to resident note for details  Pt complains of persistent L eye pain. 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 HA now resolved - Pt now with orthostatic hypotension which was present on last admission possibly secondary to fluid removal at HD - Will monitor off BP meds for now - If persistent orthostasis will consider compression stockings  Optic perineuritis: - c/w prednisone. Outpatient optho f/u - c/w pain control  ESRD: - c/w HD per renal  D/c back to SNF once orthostasis resolves

## 2014-02-05 LAB — CBC
HCT: 32.6 % — ABNORMAL LOW (ref 39.0–52.0)
Hemoglobin: 11.3 g/dL — ABNORMAL LOW (ref 13.0–17.0)
MCH: 29.2 pg (ref 26.0–34.0)
MCHC: 34.7 g/dL (ref 30.0–36.0)
MCV: 84.2 fL (ref 78.0–100.0)
Platelets: 194 10*3/uL (ref 150–400)
RBC: 3.87 MIL/uL — ABNORMAL LOW (ref 4.22–5.81)
RDW: 15.4 % (ref 11.5–15.5)
WBC: 9.8 10*3/uL (ref 4.0–10.5)

## 2014-02-05 LAB — RENAL FUNCTION PANEL
Albumin: 2.9 g/dL — ABNORMAL LOW (ref 3.5–5.2)
Anion gap: 22 — ABNORMAL HIGH (ref 5–15)
BUN: 108 mg/dL — ABNORMAL HIGH (ref 6–23)
CO2: 21 mEq/L (ref 19–32)
Calcium: 8.5 mg/dL (ref 8.4–10.5)
Chloride: 94 mEq/L — ABNORMAL LOW (ref 96–112)
Creatinine, Ser: 10.3 mg/dL — ABNORMAL HIGH (ref 0.50–1.35)
GFR calc Af Amer: 6 mL/min — ABNORMAL LOW (ref >90)
GFR calc non Af Amer: 5 mL/min — ABNORMAL LOW (ref >90)
Glucose, Bld: 220 mg/dL — ABNORMAL HIGH (ref 70–99)
Phosphorus: 4.1 mg/dL (ref 2.3–4.6)
Potassium: 4.1 mEq/L (ref 3.7–5.3)
Sodium: 137 mEq/L (ref 137–147)

## 2014-02-05 MED ORDER — LIDOCAINE-PRILOCAINE 2.5-2.5 % EX CREA: 1.0000 "application " | TOPICAL_CREAM | CUTANEOUS | Status: DC | PRN

## 2014-02-05 MED ORDER — CALCIUM ACETATE 667 MG PO CAPS: 667.0000 mg | ORAL_CAPSULE | ORAL | Status: DC | PRN

## 2014-02-05 MED ORDER — AMLODIPINE BESYLATE 5 MG PO TABS
5.0000 mg | ORAL_TABLET | Freq: Every evening | ORAL | Status: DC
Start: 2014-02-05 — End: 2014-02-05
  Filled 2014-02-05: qty 1

## 2014-02-05 MED ORDER — HEPARIN SODIUM (PORCINE) 1000 UNIT/ML DIALYSIS
1000.0000 [IU] | INTRAMUSCULAR | Status: DC | PRN
Start: 2014-02-05 — End: 2014-02-05

## 2014-02-05 MED ORDER — DOXERCALCIFEROL 4 MCG/2ML IV SOLN
INTRAVENOUS | Status: AC
  Administered 2014-02-05: 2 ug via INTRAVENOUS
  Filled 2014-02-05: qty 2

## 2014-02-05 MED ORDER — NEPRO/CARBSTEADY PO LIQD
237.0000 mL | ORAL | Status: DC | PRN
Start: 2014-02-05 — End: 2014-02-05

## 2014-02-05 MED ORDER — AMLODIPINE BESYLATE 5 MG PO TABS: ORAL_TABLET | ORAL | Status: DC

## 2014-02-05 MED ORDER — ALTEPLASE 2 MG IJ SOLR
2.0000 mg | Freq: Once | INTRAMUSCULAR | Status: DC | PRN
Start: 2014-02-05 — End: 2014-02-05

## 2014-02-05 MED ORDER — LIDOCAINE HCL (PF) 1 % IJ SOLN
5.0000 mL | INTRAMUSCULAR | Status: DC | PRN
Start: 2014-02-05 — End: 2014-02-05

## 2014-02-05 MED ORDER — HYDROCODONE-ACETAMINOPHEN 5-325 MG PO TABS
ORAL_TABLET | ORAL | Status: AC
  Filled 2014-02-05: qty 2

## 2014-02-05 MED ORDER — SODIUM CHLORIDE 0.9 % IV SOLN: 100.0000 mL | INTRAVENOUS | Status: DC | PRN

## 2014-02-05 MED ORDER — PENTAFLUOROPROP-TETRAFLUOROETH EX AERO
1.0000 | INHALATION_SPRAY | CUTANEOUS | Status: DC | PRN
Start: 2014-02-05 — End: 2014-02-05

## 2014-02-05 NOTE — Progress Notes (Signed)
Pt prepared for d/c to [facility] health care (SNF). IV dc'c. Skin intact except as most recent charted. Vitals are stable. Report called to receiving facility. Pt to be transported by ambulance service.   Shelbie Hutching, RN

## 2014-02-05 NOTE — Progress Notes (Signed)
CARE MANAGEMENT NOTE 02/05/2014  Patient:  Sonnen,Kenyan   Account Number:  [id number]  Date Initiated:  02/01/2014  Documentation initiated by:  Lizabeth Leyden  Subjective/Objective Assessment:   admitted with hypertensive emergency  medical hx: inpt 10/3-10/13 cellulitis left eye     Action/Plan:   progression of care and discharge planning   Anticipated DC Date:  02/04/2014   Anticipated DC Plan:  [place]  In-house referral  Clinical Social Worker         Choice offered to / List presented to:             Status of service:  Completed, signed off Medicare Important Message given?  YES (If response is "NO", the following Medicare IM given date fields will be blank) Date Medicare IM given:  02/05/2014 Medicare IM given by:  Lizabeth Leyden Date Additional Medicare IM given:   Additional Medicare IM given by:    Discharge Disposition:  [place]  Per UR Regulation:    If discussed at Long Length of Stay Meetings, dates discussed:    Comments:  02/05/2014  [place], [place] [phone] patient to discharge to [facility]

## 2014-02-05 NOTE — Discharge Summary (Signed)
INTERNAL MEDICINE ATTENDING DISCHARGE COSIGN   I evaluated the patient on the day of discharge and discussed the discharge plan with my resident team. I agree with the discharge documentation and disposition.   Latonda Larrivee 02/05/2014, 7:47 PM

## 2014-02-05 NOTE — Progress Notes (Signed)
Subjective: [name] was seen in HD today. He said that the eye pain was controlled.   Objective: Vital signs in last 24 hours: Filed Vitals:   02/05/14 0845 02/05/14 0900 02/05/14 0930 02/05/14 1000  BP: 99/57 117/49 96/70 108/59  Pulse: 74 76 70 72  Temp:      TempSrc:      Resp:      Height:      Weight:      SpO2:       Weight change: -0.001 kg (-0 oz)  Intake/Output Summary (Last 24 hours) at 02/05/14 1021 Last data filed at 02/05/14 0525  Gross per 24 hour  Intake    480 ml  Output      0 ml  Net    480 ml   General: resting in bed in NAD, sleeping   HEENT: Less periorbital swelling on L compared to R from initial presentation; complete ptosis of [name]; binocular diplopia, EOM in L eye continue to be limited and L abduction may be more grossly intact; L and R eye pupils are somewhat reactive to light today and R eye pupil is constricted compared to the previous days  Cardiac: RRR, no rubs, murmurs or gallops   Pulm: clear to auscultation bilaterally, moving normal volumes of air  Abd: soft, nontender, nondistended, BS present  Ext: warm and well perfused, no pedal edema.  Neuro: alert and oriented X3, responding appropriately, following commands. Gait not assessed today.   Lab Results: None  Medications:  Scheduled Meds: . amLODipine  5 mg Oral QPM  . aspirin EC  81 mg Oral Daily  . calcium acetate  1,334 mg Oral TID WC  . doxercalciferol      . doxercalciferol  2 mcg Intravenous Q T,Th,Sa-HD  . feeding supplement (RESOURCE BREEZE)  1 Container Oral TID BM  . heparin  5,000 Units Subcutaneous 3 times per day  . HYDROcodone-acetaminophen      . levETIRAcetam  1,000 mg Oral Daily  . multivitamin  1 tablet Oral QHS  . predniSONE  60 mg Oral Q breakfast  . sodium chloride  3 mL Intravenous Q12H  . sodium chloride  3 mL Intravenous Q12H   Continuous Infusions:  PRN Meds:.sodium chloride, sodium chloride, sodium chloride, alteplase, calcium acetate, feeding  supplement (NEPRO CARB STEADY), heparin, HYDROcodone-acetaminophen, lidocaine (PF), lidocaine-prilocaine, ondansetron (ZOFRAN) IV, ondansetron, pentafluoroprop-tetrafluoroeth, sodium chloride  Assessment/Plan: Principal Problem:   Hypertensive urgency Active Problems:   Diabetes mellitus without complication   ESRD on hemodialysis   Seizure disorder   Cocaine abuse   Optic neuritis, left   Protein-calorie malnutrition, severe   Orthostatic hypotension   Accelerated hypertension  [name] is a 58 year old man with PMH of ESRD on HD T/Th/Sat, HTN, cocaine abuse, recently discharged on 10/13 after dx of optic perineuritis, on prednisone, presenting with worsening left eye pain and headache and hypertensive urgency.   Accelerated Hypertension/Hypertensive urgency: Continues to be orthostatic with increase in HR yesterday and so decided to observe for one more day. Will obtain vitals again today. Planning to start 2.5 mg amlodipine on non-dialysis days. BPs in the 24-580D systolic yesterday and hydralazine is continued to be held and patient feels less dizzy now. His new EDW is 55 Kg. On 10/18 afternoon, RN informed that his standing BP was 104/63 and patient reported feeling dizzy and lightheaded.  However, yesterday, he was able to walk with PT down the hallways but did experience lightheadedness upon returning to  the room.  - Continue holding hydralazine for now  - ask RN to check orthostatic vitals - will plan to discharge on 5 mg amlodipine on non-HD days   Optic perineuritis, left: Less swelling compared to previous admission, but still reports of inability to open [name] and binocular diplopia. Based on MRI/MRA of head and orbit on 10/05, no evidence of cavernous sinus thrombosis was seen and mild distention of L optic nerve sheath was noted. According to Dr. Posey Pronto, the significant inflammation of the perineural sheath of the L eye is likely consistent with optic perineuritis, a rare  variant of orbital pseudotumor. ACE level is normal, RPR is NR, ANCA antibodies negative. Dr. Tessa Lerner had mentioned a case report of recurrent optic perineuritis after intranasal cocaine abuse (Coppens et al. Neuro-Ophthalmology 30(2): [phone]) though patient reports smoking cocaine and very occasional use of it intranasally. Patient's MRI had noted minimal edema in the base of L maxillary sinus and CT maxillary from 10/4 noted a soft density at L maxilla eye with destructive changes of posterior molar, suggesting peridontal inflammation and with a further discussion with radiologist on 10/5, there are likely other abnormalities in his oral cavity including dental caries in his R molars and loose dental roots and given this, one can consider this serving as nidus of infection for his presentation.  - informed Dr. Posey Pronto that patient is admitted, will follow-up with her this Friday, 10/23 at 4:30 p.m.  - continue 60 mg PO prednisone - continue Norco/Vicodin 5-325 mg q4h PRN pain  - discontinued 0.5 Dilaudid IV q8h PRN pain   Diabetes Mellitus: Hgb A1c 5.0 July 2014. Serum BG 165. [name] not currently on treatment. Has not needed SSI after initiation of steroids   ESRD on hemodialysis, TThS schedule: receiving HD today, 5.3 L off.  - HD per nephrology  - appreciate nephro's involvement   Cocaine abuse: patient has a long history of cocaine abuse with recent use being about three days ago before presentation on last admission. He admits to no use in between the discharge time between this and previous admission.  - counseled on cessation   Seizure disorder: No seizure activity during last hospitalization or during this admission.  - continue home Keppra   VTE ppx: heparin   Diet: renal diet with 1200 mL fluid restriction   Disposition: SNF   This is a Careers information officer Note.  The care of the patient was discussed with Dr. Megan Salon and Dr. Hayes Ludwig and the assessment and plan formulated with their  assistance. Please see their attached note for official documentation of the daily encounter.   LOS: 4 days   [name], Med Student 02/05/2014, 10:21 AM

## 2014-02-05 NOTE — Progress Notes (Signed)
[facility] Progress Note  Assessment/Plan: 1. Left eye pain - sec to optic perineuritis, per Ophthalmology and ID; on steroids, pain previously controlled; consider more liberal pain meds- shorter interval ? (something he could be discharged with).  2. ESRD - HD on TTS  new EDW of around 49 - had been 58.5 - see #3; has been on no heparin HD - probably due to low Hgb in May when he was transfused 6 units- see anemia. - getting SQ heparin here- However, since Hgb is ok without heparin - favor continuing to hold after d/c though could get prn if needed 3. HTN/volume with hypertensive urgency at the time of readmission - BP 120 - 160s  on Hydralazine 25 mg q8h prior to admission; post HD wt 55.2 kg Sat,; pre HD standing weights was 59.8 which made his goal over 5 L today - BP dropped to 811 systolic with 1.8 L off and recheck BP 99 systolic - pt asymptomatic - plan lowered goal to 4 L today - at present he is NOT on any BP meds - ? Resume - not sure it can be controlled by volume removal alone - previously on hydralazine 100 bid/Irbesartan 150 qd - HE does not know the names of his meds other than he thinks it might be amlodipine; give outpt pre HD BPs in the 220 range, I suspect he wasn't taking any meds.- evaluate edw post HD Have added norvasc 5 mg qhs, will need lower dry wt prob around 55kg 4. Anemia - Hgb trending down - now 11.3, Aranesp on hold - redose if < 11, continue weekly Fe; Seen by Dr. 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 Henrene Pastor May 2015 - EGD mild gastritis/only small colonic adenoma - due for surveillance colonoscopy 2017- rec stop hemoculting stools unless overt GIB- also rec continue Fe, ESA and prn transfusions 5. Sec HPT - Ca 8.5 (9.3 corrected), P 4.1, iPTH 221; Hectorol 2 mcg, Phoslo 3 with meals, added one with snacks, should  improve. 6. Nutrition - Alb 2.9, renal diet, add multivitamin.  7. Hx Dm Type 2 per primary 8. Hepatitis C  9. Hx seizure disorder - on Keppra.  10. Hx cocaine abuse - 11. Disp - Dr. Mercy Moore IS NOT PCP - he is his nephrologist; pending SNF placement  Myriam Jacobson, PA-C [place] [phone] 02/05/2014,8:48 AM  LOS: 4 days   Pt seen, examined, agree w assess/plan as above with additions as indicated.  Kelly Splinter MD pager [phone]    cell [phone] 02/05/2014, 9:54 AM     Subjective:   Wants someone to help him clean his eye - feels grits; pain the same; otherwise no c/o  Objective Filed Vitals:   02/04/14 1714 02/04/14 2041 02/05/14 0519 02/05/14 0700  BP: 161/73 146/67 145/76 162/71  Pulse: 73 73 76   Temp: 98.1 F (36.7 C) 98.4 F (36.9 C) 98.1 F (36.7 C) 97.9 F (36.6 C)  TempSrc: Oral Oral Oral Oral  Resp: 20 20 20 16   Height:      Weight:  56.799 kg (125 lb 3.5 oz)  59.8 kg (131 lb 13.4 oz)  SpO2: 97% 97% 100% 100%   Physical Exam General: NAD supine on HD Heart: RRR ~75 Lungs: no  rales Abdomen: soft NT Extremities: no LE Dialysis Access: left  upper AVF at 325 - staff turned down due to alarming - to try to titrate back up  Dialysis Orders: TTS @ AF  58.5 kg 4 hrs 2K/2.25Ca 400/A1.5 No Heparin AVF @ LUA  Hectorol 2Aranesp 80 mcg 9/29 and Venofer 50 mg on Thurs.   Additional Objective Labs: Basic Metabolic Panel:  Recent Labs Lab 02/01/14 0749 02/01/14 0758 02/02/14 0836 02/05/14 0720  NA 137 133* 135* 137  K 4.7 4.3 5.5* 4.1  CL 94* 105 93* 94*  CO2 24  --  23 21  GLUCOSE 153* 154* 95 220*  BUN 64* 58* 83* 108*  CREATININE 7.28* 7.40* 9.27* 10.30*  CALCIUM 9.1  --  8.4 8.5  PHOS  --   --  6.6* 4.1   Liver Function Tests:  Recent Labs Lab 02/01/14 0749 02/02/14 0836 02/05/14 0720  AST 36  --   --   ALT 48  --   --   ALKPHOS 61  --   --   BILITOT 0.3  --   --   PROT 8.2  --   --   ALBUMIN 3.5 2.9*  2.9*   CBC:  Recent Labs Lab 02/01/14 0749 02/01/14 0758 02/02/14 0835 02/05/14 0720  WBC 9.9  --  8.1 9.8  NEUTROABS 7.5  --   --   --   HGB 13.6 15.3 12.1* 11.3*  HCT 40.4 45.0 35.1* 32.6*  MCV 87.4  --  84.6 84.2  PLT 214  --  214 194   Blood Culture    Component Value Date/Time   SDES BLOOD RIGHT ARM 01/19/2014 1937   SPECREQUEST BOTTLES DRAWN AEROBIC AND ANAEROBIC 10 CC 01/19/2014 1937   CULT  Value: NO GROWTH 5 DAYS Performed at [facility] 01/19/2014 1937   REPTSTATUS 01/26/2014 FINAL 01/19/2014 1937    Cardiac Enzymes:  Recent Labs Lab 02/01/14 0749 02/01/14 1405 02/01/14 1852  TROPONINI <0.30 <0.30 <0.30  Medications:   . aspirin EC  81 mg Oral Daily  . calcium acetate  1,334 mg Oral TID WC  . doxercalciferol      . doxercalciferol  2 mcg Intravenous Q T,Th,Sa-HD  . feeding supplement (RESOURCE BREEZE)  1 Container Oral TID BM  . heparin  5,000 Units Subcutaneous 3 times per day  . HYDROcodone-acetaminophen      . levETIRAcetam  1,000 mg Oral Daily  . multivitamin  1 tablet Oral QHS  . predniSONE  60 mg Oral Q breakfast  . sodium chloride  3 mL Intravenous Q12H  . sodium chloride  3 mL Intravenous Q12H

## 2014-02-05 NOTE — Procedures (Signed)
I was present at this dialysis session, have reviewed the session itself and made  appropriate changes  Kelly Splinter MD (pgr) [phone]    ([phone] 02/05/2014, 10:05 AM

## 2014-02-05 NOTE — Progress Notes (Signed)
I have seen the patient and reviewed the daily progress note by Josiah Lobo MS 4 and discussed the care of the patient with them.  See below for documentation of my findings, assessment, and plans.  Subjective: He was seen and examined this morning while he was undergoing HD. He still has left eye pain but this improves with pain medication.   Objective: Vital signs in last 24 hours: Filed Vitals:   02/05/14 0930 02/05/14 1000 02/05/14 1033 02/05/14 1205  BP: 96/70 108/59 136/63 117/80  Pulse: 70 72 74 77  Temp:   97.6 F (36.4 C) 98 F (36.7 C)  TempSrc:   Oral Oral  Resp:   20 18  Height:      Weight:   126 lb 1.7 oz (57.2 kg)   SpO2:   100% 98%   Weight change: -0 oz (-0.001 kg)  Intake/Output Summary (Last 24 hours) at 02/05/14 1408 Last data filed at 02/05/14 1300  Gross per 24 hour  Intake    960 ml  Output   2382 ml  Net  -1422 ml   Vitals reviewed. General: resting in bed, in NAD, undergoing HD HEENT: Left eye with minimum periorbital swelling with no redness; complete ptosis of [name]; binocular diplopia, EOM in L eye persistently limited; L and R eye pupils reactive to light but sluggish Cardiac: RRR, no rubs, soft murmur heard over LUSB, no gallops Pulm: clear to auscultation bilaterally, no wheezes, rales, or rhonchi Abd: soft, nontender, nondistended, BS present Ext: warm and well perfused, no pedal edema, dry skin Neuro: alert and oriented X3, following commands, moves all extremities voluntairly  Lab Results: Reviewed and documented in Electronic Record Micro Results: Reviewed and documented in Electronic Record Studies/Results: Reviewed and documented in Electronic Record Medications: I have reviewed the patient's current medications. Scheduled Meds: . amLODipine  5 mg Oral QPM  . aspirin EC  81 mg Oral Daily  . calcium acetate  1,334 mg Oral TID WC  . doxercalciferol  2 mcg Intravenous Q T,Th,Sa-HD  . feeding supplement (RESOURCE  BREEZE)  1 Container Oral TID BM  . heparin  5,000 Units Subcutaneous 3 times per day  . levETIRAcetam  1,000 mg Oral Daily  . multivitamin  1 tablet Oral QHS  . predniSONE  60 mg Oral Q breakfast  . sodium chloride  3 mL Intravenous Q12H   Continuous Infusions:  PRN Meds:.sodium chloride, calcium acetate, HYDROcodone-acetaminophen, ondansetron (ZOFRAN) IV, ondansetron, sodium chloride Assessment/Plan: 58 year old man with PMH of ESRD on HD T/Th/Sat, HTN, cocaine abuse, recently discharged on 10/13 after dx of optic neuritis, on prednisone, presenting with worsening left eye pain and headache, and hypertensive urgency.   Accelerated Hypertension/Hypertensive urgency: His blood pressure has been stable to elevated overnight from 145/76 to 162/71. He underwent HD today and tolerated it well. He still had orthostasis this afternoon but it improved with prolonged sitting, while standing with assistance for 3 minutes. He likely has autonomic dysfunction which may always be present and was educated on preventing falls by standing up gradually to allow BP adjustment.  He does need an antihypertensive and we will start a low dose on non HD days to prevent hypotension.  - Nephrology following, appreciate recommendations - d/c hydralazine  - Start amlodipine 5mg  daily on non dialysis days  - he can likely d/c to SNF today  Optic perineuritis, left: He says his pain is controlled. Eye exam is similar to the one on presentation without  worsening of vision.  - continue prednisone 60mg  daily  - Ophthalmology has been informed he is re-hospitalized; he will need to follow-up with them at discharge  - d/c dilaudid and increased norco prn for pain control (in anticipation of discharge)   ESRD: On HD T/Th/Sat at [facility].  - HD per nephrology  - continue home phoslo   Hx of Anemia: Of chronic disease. Stable.   Protein-calorie malnutrition: Likely due to chronic medical disease. Denies melena,  hematochezia, nightsweats.  - continue supplemental shakes   Hx of cocaine use: Denies use after discharge from the hospital. He was at SNF so he likely has not used. UDS could not be obtained as pt voids only 1-2 times per day.   Hx of seizures: No recent seizure activity. Continue home [place].   DM2: Diet controlled. Last Hgb A1C 5.0% on 11/07/12.   Hepatitis C: Per Nephrology pt tested positive for hepatitis C on 02/28/12 but no record found in EPIC.  -Consider testing for hepatitis C again as he may be a candidate for tx   Diet: renal   DVT prophylaxis: heparin TID [place]   Code: Full   Dispo: Disposition is deferred at this time, awaiting improvement of current medical problems. Anticipated discharge in approximately 1-2 day(s).   The patient does not have a current PCP and does need an [facility] hospital follow-up appointment after discharge. He will follow up with [facility].   The patient does not have transportation limitations that hinder transportation to clinic appointments.  .Services Needed at time of discharge: Y = Yes, Blank = No  PT:    OT:    RN:    Equipment:    Other:       LOS: 4 days   Blain Pais, MD PGY3. IMTS 02/05/2014, 2:08 PM

## 2014-02-05 NOTE — Progress Notes (Signed)
Pt seen and examined with [facility] and Dr. Hayes Ludwig. Please refer to resident note for details   Pt complains of persistent L eye pain. 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  - Pt with SBP of 160s yesterday. Start on norvasc 5 mg - Orthostasis improved today - Only mild today. Outpatient f/u.    Optic perineuritis:  - c/w prednisone. Outpatient optho f/u  - c/w pain control   ESRD:  - c/w HD per renal   Pt stable for d/c to SNF today

## 2014-02-05 NOTE — Discharge Instructions (Signed)
[name], you were admitted because your blood pressure was very high. During your last dialysis, you had 4 L of fluids removed and your pressures have been at range since you have been at the hospital even without taking any medication for your blood pressures. We would like you to continue on 60 mg of prednisone and see Dr. Posey Pronto of Pediatric Opthalmology this Friday, Oct 23 at 4:30 p.m.   A few recommendations:  1) CONTINUE 60 mg prednisone  2) You may continue to wear the eye patch to help with preventing yourself from seeing double vision.  3) STOP taking hydralazine, your medication to help your lower your blood pressure.  4) AVOID use of cocaine or other drugs  5) WHEN YOU STAND UP, PLEASE DO SO SLOWLY TO PREVENT YOURSELF FROM GETTING TOO LIGHTHEADED TOO QUICKLY. WE RECOMMEND TO USE A WALKER TO HELP WITH STABILIZING YOURSELF.

## 2014-02-15 ENCOUNTER — Encounter (HOSPITAL_COMMUNITY): Payer: Self-pay | Admitting: Emergency Medicine

## 2014-02-15 ENCOUNTER — Emergency Department (HOSPITAL_COMMUNITY): Payer: Medicare Other

## 2014-02-15 ENCOUNTER — Observation Stay (HOSPITAL_COMMUNITY)
Admission: EM | Admit: 2014-02-15 | Discharge: 2014-02-18 | Disposition: A | Discharge status: Skilled Nursing Facility | Payer: Medicare Other | Attending: Internal Medicine | Admitting: Internal Medicine

## 2014-02-15 DIAGNOSIS — F1410 Cocaine abuse, uncomplicated: Secondary | ICD-10-CM

## 2014-02-15 DIAGNOSIS — B181 Chronic viral hepatitis B without delta-agent: Secondary | ICD-10-CM | POA: Insufficient documentation

## 2014-02-15 DIAGNOSIS — H469 Unspecified optic neuritis: Secondary | ICD-10-CM | POA: Insufficient documentation

## 2014-02-15 DIAGNOSIS — E119 Type 2 diabetes mellitus without complications: Secondary | ICD-10-CM | POA: Insufficient documentation

## 2014-02-15 DIAGNOSIS — B182 Chronic viral hepatitis C: Secondary | ICD-10-CM | POA: Diagnosis present

## 2014-02-15 DIAGNOSIS — Z7952 Long term (current) use of systemic steroids: Secondary | ICD-10-CM | POA: Diagnosis not present

## 2014-02-15 DIAGNOSIS — Z992 Dependence on renal dialysis: Secondary | ICD-10-CM | POA: Diagnosis not present

## 2014-02-15 DIAGNOSIS — Z9115 Patient's noncompliance with renal dialysis: Secondary | ICD-10-CM | POA: Insufficient documentation

## 2014-02-15 DIAGNOSIS — I10 Essential (primary) hypertension: Secondary | ICD-10-CM | POA: Diagnosis present

## 2014-02-15 DIAGNOSIS — G40909 Epilepsy, unspecified, not intractable, without status epilepticus: Secondary | ICD-10-CM | POA: Insufficient documentation

## 2014-02-15 DIAGNOSIS — K625 Hemorrhage of anus and rectum: Secondary | ICD-10-CM

## 2014-02-15 DIAGNOSIS — F17210 Nicotine dependence, cigarettes, uncomplicated: Secondary | ICD-10-CM | POA: Diagnosis not present

## 2014-02-15 DIAGNOSIS — R079 Chest pain, unspecified: Secondary | ICD-10-CM | POA: Diagnosis not present

## 2014-02-15 DIAGNOSIS — Z79891 Long term (current) use of opiate analgesic: Secondary | ICD-10-CM | POA: Diagnosis not present

## 2014-02-15 DIAGNOSIS — I5032 Chronic diastolic (congestive) heart failure: Secondary | ICD-10-CM | POA: Diagnosis not present

## 2014-02-15 DIAGNOSIS — E875 Hyperkalemia: Secondary | ICD-10-CM

## 2014-02-15 DIAGNOSIS — I132 Hypertensive heart and chronic kidney disease with heart failure and with stage 5 chronic kidney disease, or end stage renal disease: Secondary | ICD-10-CM | POA: Diagnosis not present

## 2014-02-15 DIAGNOSIS — N186 End stage renal disease: Secondary | ICD-10-CM | POA: Diagnosis not present

## 2014-02-15 DIAGNOSIS — E440 Moderate protein-calorie malnutrition: Secondary | ICD-10-CM

## 2014-02-15 DIAGNOSIS — I119 Hypertensive heart disease without heart failure: Secondary | ICD-10-CM | POA: Diagnosis present

## 2014-02-15 DIAGNOSIS — R112 Nausea with vomiting, unspecified: Secondary | ICD-10-CM

## 2014-02-15 DIAGNOSIS — G4700 Insomnia, unspecified: Secondary | ICD-10-CM

## 2014-02-15 DIAGNOSIS — D631 Anemia in chronic kidney disease: Secondary | ICD-10-CM | POA: Insufficient documentation

## 2014-02-15 DIAGNOSIS — I160 Hypertensive urgency: Secondary | ICD-10-CM

## 2014-02-15 DIAGNOSIS — Z515 Encounter for palliative care: Secondary | ICD-10-CM

## 2014-02-15 DIAGNOSIS — R630 Anorexia: Secondary | ICD-10-CM

## 2014-02-15 HISTORY — DX: Tobacco use: Z72.0

## 2014-02-15 HISTORY — DX: Unspecified severe protein-calorie malnutrition: E43

## 2014-02-15 HISTORY — DX: Hypertensive heart disease without heart failure: I11.9

## 2014-02-15 HISTORY — DX: Chronic diastolic (congestive) heart failure: I50.32

## 2014-02-15 HISTORY — DX: Unspecified optic neuritis: H46.9

## 2014-02-15 LAB — I-STAT CHEM 8, ED
BUN: >140 mg/dL — ABNORMAL HIGH (ref 6–23)
Calcium, Ion: 1.14 mmol/L (ref 1.12–1.23)
Chloride: 107 mEq/L (ref 96–112)
Creatinine, Ser: 11.3 mg/dL — ABNORMAL HIGH (ref 0.50–1.35)
GLUCOSE: 137 mg/dL — AB (ref 70–99)
HCT: 28 % — ABNORMAL LOW (ref 39.0–52.0)
Hemoglobin: 9.5 g/dL — ABNORMAL LOW (ref 13.0–17.0)
POTASSIUM: 6.3 meq/L — AB (ref 3.7–5.3)
SODIUM: 140 meq/L (ref 137–147)
TCO2: 22 mmol/L (ref 0–100)

## 2014-02-15 LAB — CBC WITH DIFFERENTIAL/PLATELET
BASOS ABS: 0 10*3/uL (ref 0.0–0.1)
Basophils Relative: 0 % (ref 0–1)
EOS ABS: 0 10*3/uL (ref 0.0–0.7)
Eosinophils Relative: 0 % (ref 0–5)
HCT: 26.5 % — ABNORMAL LOW (ref 39.0–52.0)
Hemoglobin: 8.9 g/dL — ABNORMAL LOW (ref 13.0–17.0)
Lymphocytes Relative: 15 % (ref 12–46)
Lymphs Abs: 1.1 10*3/uL (ref 0.7–4.0)
MCH: 28.7 pg (ref 26.0–34.0)
MCHC: 33.6 g/dL (ref 30.0–36.0)
MCV: 85.5 fL (ref 78.0–100.0)
Monocytes Absolute: 0.3 10*3/uL (ref 0.1–1.0)
Monocytes Relative: 4 % (ref 3–12)
NEUTROS PCT: 81 % — AB (ref 43–77)
Neutro Abs: 6.1 10*3/uL (ref 1.7–7.7)
PLATELETS: 157 10*3/uL (ref 150–400)
RBC: 3.1 MIL/uL — ABNORMAL LOW (ref 4.22–5.81)
RDW: 15.7 % — AB (ref 11.5–15.5)
WBC: 7.6 10*3/uL (ref 4.0–10.5)

## 2014-02-15 LAB — POC OCCULT BLOOD, ED: Fecal Occult Bld: POSITIVE — AB

## 2014-02-15 LAB — CBG MONITORING, ED: GLUCOSE-CAPILLARY: 136 mg/dL — AB (ref 70–99)

## 2014-02-15 LAB — GLUCOSE, CAPILLARY: GLUCOSE-CAPILLARY: 149 mg/dL — AB (ref 70–99)

## 2014-02-15 LAB — TROPONIN I: Troponin I: <0.3 ng/mL (ref <0.30)

## 2014-02-15 MED ORDER — LEVETIRACETAM 500 MG PO TABS
1000.0000 mg | ORAL_TABLET | Freq: Every day | ORAL | Status: DC
  Administered 2014-02-15 – 2014-02-18 (×4): 1000 mg via ORAL
  Filled 2014-02-15 (×4): qty 2

## 2014-02-15 MED ORDER — DOXERCALCIFEROL 4 MCG/2ML IV SOLN
2.0000 ug | Freq: Once | INTRAVENOUS | Status: AC
  Administered 2014-02-15: 2 ug via INTRAVENOUS
  Filled 2014-02-15: qty 2

## 2014-02-15 MED ORDER — AMLODIPINE BESYLATE 5 MG PO TABS
5.0000 mg | ORAL_TABLET | ORAL | Status: DC
  Filled 2014-02-15: qty 1

## 2014-02-15 MED ORDER — INSULIN ASPART 100 UNIT/ML ~~LOC~~ SOLN
0.0000 [IU] | Freq: Three times a day (TID) | SUBCUTANEOUS | Status: DC
  Administered 2014-02-17 (×3): 2 [IU] via SUBCUTANEOUS
  Administered 2014-02-18: 1 [IU] via SUBCUTANEOUS
  Administered 2014-02-18: 8 [IU] via SUBCUTANEOUS

## 2014-02-15 MED ORDER — DOXERCALCIFEROL 4 MCG/2ML IV SOLN
2.0000 ug | INTRAVENOUS | Status: DC
  Administered 2014-02-16: 2 ug via INTRAVENOUS
  Filled 2014-02-15: qty 2

## 2014-02-15 MED ORDER — HYDRALAZINE HCL 50 MG PO TABS
100.0000 mg | ORAL_TABLET | Freq: Three times a day (TID) | ORAL | Status: DC
  Administered 2014-02-15 – 2014-02-18 (×6): 100 mg via ORAL
  Filled 2014-02-15 (×13): qty 2

## 2014-02-15 MED ORDER — HYDROCODONE-ACETAMINOPHEN 5-325 MG PO TABS
1.0000 | ORAL_TABLET | Freq: Four times a day (QID) | ORAL | Status: DC | PRN
  Administered 2014-02-15 – 2014-02-16 (×3): 2 via ORAL
  Administered 2014-02-17: 1 via ORAL
  Administered 2014-02-18: 2 via ORAL
  Filled 2014-02-15: qty 1
  Filled 2014-02-15 (×3): qty 2

## 2014-02-15 MED ORDER — LABETALOL HCL 200 MG PO TABS
200.0000 mg | ORAL_TABLET | ORAL | Status: DC
Start: 2014-02-17 — End: 2014-02-18
  Administered 2014-02-17 – 2014-02-18 (×2): 200 mg via ORAL
  Filled 2014-02-15 (×4): qty 1

## 2014-02-15 MED ORDER — NITROGLYCERIN 0.4 MG SL SUBL
SUBLINGUAL_TABLET | SUBLINGUAL | Status: AC
  Filled 2014-02-15: qty 1

## 2014-02-15 MED ORDER — SODIUM CHLORIDE 0.9 % IJ SOLN
3.0000 mL | Freq: Two times a day (BID) | INTRAMUSCULAR | Status: DC
  Administered 2014-02-15 – 2014-02-18 (×5): 3 mL via INTRAVENOUS

## 2014-02-15 MED ORDER — AMLODIPINE BESYLATE 5 MG PO TABS
5.0000 mg | ORAL_TABLET | Freq: Every day | ORAL | Status: DC
  Administered 2014-02-15: 5 mg via ORAL
  Filled 2014-02-15 (×3): qty 1

## 2014-02-15 MED ORDER — ACETAMINOPHEN 325 MG PO TABS
650.0000 mg | ORAL_TABLET | ORAL | Status: DC | PRN
  Administered 2014-02-16 – 2014-02-17 (×2): 650 mg via ORAL
  Filled 2014-02-15 (×2): qty 2

## 2014-02-15 MED ORDER — HYDRALAZINE HCL 20 MG/ML IJ SOLN: 5.0000 mg | Freq: Four times a day (QID) | INTRAMUSCULAR | Status: DC | PRN

## 2014-02-15 MED ORDER — CALCIUM ACETATE 667 MG PO CAPS
2001.0000 mg | ORAL_CAPSULE | Freq: Three times a day (TID) | ORAL | Status: DC
  Administered 2014-02-16 – 2014-02-18 (×5): 2001 mg via ORAL
  Filled 2014-02-15 (×11): qty 3

## 2014-02-15 MED ORDER — RENA-VITE PO TABS
1.0000 | ORAL_TABLET | Freq: Every day | ORAL | Status: DC
  Administered 2014-02-15 – 2014-02-17 (×3): 1 via ORAL
  Filled 2014-02-15 (×4): qty 1

## 2014-02-15 MED ORDER — HYDRALAZINE HCL 20 MG/ML IJ SOLN
20.0000 mg | Freq: Once | INTRAMUSCULAR | Status: AC
  Administered 2014-02-15: 20 mg via INTRAVENOUS
  Filled 2014-02-15 (×2): qty 1

## 2014-02-15 MED ORDER — ONDANSETRON HCL 4 MG/2ML IJ SOLN
4.0000 mg | Freq: Four times a day (QID) | INTRAMUSCULAR | Status: DC | PRN
  Administered 2014-02-15 – 2014-02-16 (×3): 4 mg via INTRAVENOUS
  Filled 2014-02-15 (×3): qty 2

## 2014-02-15 MED ORDER — SODIUM CHLORIDE 0.9 % IV SOLN
62.5000 mg | Freq: Once | INTRAVENOUS | Status: AC
  Administered 2014-02-15: 62.5 mg via INTRAVENOUS
  Filled 2014-02-15: qty 5

## 2014-02-15 MED ORDER — CALCIUM ACETATE 667 MG PO CAPS
1334.0000 mg | ORAL_CAPSULE | Freq: Three times a day (TID) | ORAL | Status: DC
  Filled 2014-02-15: qty 2

## 2014-02-15 MED ORDER — DARBEPOETIN ALFA-POLYSORBATE 25 MCG/0.42ML IJ SOLN
25.0000 ug | Freq: Once | INTRAMUSCULAR | Status: AC
  Administered 2014-02-15: 25 ug via INTRAVENOUS
  Filled 2014-02-15: qty 0.42

## 2014-02-15 MED ORDER — HEPARIN SODIUM (PORCINE) 5000 UNIT/ML IJ SOLN
5000.0000 [IU] | Freq: Three times a day (TID) | INTRAMUSCULAR | Status: DC
  Administered 2014-02-16 – 2014-02-17 (×4): 5000 [IU] via SUBCUTANEOUS
  Filled 2014-02-15 (×11): qty 1

## 2014-02-15 MED ORDER — PREDNISONE 50 MG PO TABS
60.0000 mg | ORAL_TABLET | Freq: Every day | ORAL | Status: DC
  Administered 2014-02-16 – 2014-02-18 (×3): 60 mg via ORAL
  Filled 2014-02-15 (×4): qty 1

## 2014-02-15 MED ORDER — DOXERCALCIFEROL 4 MCG/2ML IV SOLN
INTRAVENOUS | Status: AC
  Filled 2014-02-15: qty 2

## 2014-02-15 MED ORDER — CALCIUM ACETATE 667 MG PO CAPS
667.0000 mg | ORAL_CAPSULE | ORAL | Status: DC | PRN
  Filled 2014-02-15: qty 1

## 2014-02-15 MED ORDER — NITROGLYCERIN 0.4 MG SL SUBL
0.4000 mg | SUBLINGUAL_TABLET | SUBLINGUAL | Status: DC | PRN
  Administered 2014-02-15: 0.4 mg via SUBLINGUAL

## 2014-02-15 MED ORDER — LORAZEPAM 0.5 MG PO TABS
0.5000 mg | ORAL_TABLET | Freq: Every day | ORAL | Status: DC
  Administered 2014-02-15 – 2014-02-18 (×4): 0.5 mg via ORAL
  Filled 2014-02-15 (×4): qty 1

## 2014-02-15 MED ORDER — DARBEPOETIN ALFA-POLYSORBATE 25 MCG/0.42ML IJ SOLN
INTRAMUSCULAR | Status: AC
  Filled 2014-02-15: qty 0.42

## 2014-02-15 MED ORDER — HEPARIN SODIUM (PORCINE) 5000 UNIT/ML IJ SOLN
5000.0000 [IU] | Freq: Three times a day (TID) | INTRAMUSCULAR | Status: DC
Start: 2014-02-15 — End: 2014-02-15

## 2014-02-15 NOTE — ED Notes (Signed)
Ordered lunch tray 

## 2014-02-15 NOTE — ED Notes (Signed)
Error in charting, patient has not had chest xray completed yet

## 2014-02-15 NOTE — Procedures (Signed)
I was present at this dialysis session. I have reviewed the session itself and made appropriate changes.   Goal UF 4L.  Pt tolerating well.  Pearson Grippe  MD 02/15/2014, 2:26 PM

## 2014-02-15 NOTE — ED Provider Notes (Signed)
CSN: [id number]     Arrival date & time 02/15/14  4196 History   First MD Initiated Contact with Patient 02/15/14 [phone]     Chief Complaint  Patient presents with  . Hypertension     (Consider location/radiation/quality/duration/timing/severity/associated sxs/prior Treatment) HPI  [name] is a 58 y.o. male who complains of high blood pressure. He reportedly missed dialysis, Tuesday and Thursday this week, because his "blood pressure was high." [name] also being treated for left optic neuritis. He denies headache, weakness, dizziness, nausea or vomiting. [name] a reluctant historian. [name] apparently taking his usual medications. In addition, he received clonidine and labetalol, this morning for high blood pressure. There are no other known modifying factors.   Past Medical History  Diagnosis Date  . Hypertension   . Anemia, chronic disease   . CHF (congestive heart failure)     diastolic.  EF 60 - 65% per [facility] eco 11/2011  . Cocaine abuse     mentioned in notes from [place]  . Hepatitis C antibody test positive     was HIV negative, 02/28/12  . Hepatitis B core antibody positive     03/01/10  . Positive QuantiFERON-TB Gold test     11/2011  . Helicobacter pylori gastritis     not defined if this was treated  . Polyp of colon, adenomatous     May 2012.  Dr Trenton Founds in [place]  . Shortness of breath   . Hematochezia   . Head injury, closed, with concussion   . History of blood transfusion     "last one was 2 days ago" (11/09/2012)  . Type II diabetes mellitus     on oral pills only  . Arthritis     "right shoulder" (11/09/2012)  . Seizure disorder     questionable history of - will need to clarify with PCP  . Headache(784.0)     "q other day" (11/09/2012)  . ESRD on hemodialysis since 2012    Patient says he started HD in 2012.  ESRD was due to "drugs", primarily used cocaine.  Has 3-5 year hx of HTN, no DM.  Gets HD on TTS schedule at [facility].  [name] from  [place] and recently moved here.    . Hypercalcemia   . Hyperpotassemia   . Seizures    Past Surgical History  Procedure Laterality Date  . Shoulder open rotator cuff repair Right   . Total knee arthroplasty Left   . Bascilic vein transposition  03/07/2012    Procedure: BASCILIC VEIN TRANSPOSITION;  Surgeon: Conrad Oildale, MD;  Location: [place];  Service: Vascular;  Laterality: Left;  First Stage  . Insertion of dialysis catheter      right chest  . Bascilic vein transposition Left 05/31/2012    Procedure: BASCILIC VEIN TRANSPOSITION;  Surgeon: Conrad , MD;  Location: [place];  Service: Vascular;  Laterality: Left;  Left 2nd Stage Basilic Vein Transposition with gortex graft revision using 65mmx10cm graft  . Shoulder open rotator cuff repair Right   . [name] capsule study N/A 08/27/2013    Procedure: [name] CAPSULE STUDY;  Surgeon: Milus Banister, MD;  Location: [place];  Service: Endoscopy;  Laterality: N/A;   Family History  Problem Relation Age of Onset  . Diabetes Mother   . Hypertension Mother   . Stroke Mother   . Kidney failure Mother   . Cancer Father    History  Substance Use Topics  .  Smoking status: Current Every Day Smoker -- 0.25 packs/day for 25 years    Types: Cigarettes  . Smokeless tobacco: Never Used  . Alcohol Use: No     Comment: 11/09/2012 "been stopped drinking 1-2 yr ago"    Review of Systems  All other systems reviewed and are negative.     Allergies  Reglan  Home Medications   Prior to Admission medications   Medication Sig Start Date End Date Taking? Authorizing Provider  amLODipine (NORVASC) 5 MG tablet Take 5 mg by mouth 4 (four) times a week. Mon, wed, fri, sat   Yes Historical Provider, MD  calcium acetate (PHOSLO) 667 MG capsule Take 1,334 mg by mouth 3 (three) times daily with meals.    Yes Historical Provider, MD  calcium acetate (PHOSLO) 667 MG capsule Take 1 capsule (667 mg total) by mouth as needed (with snacks). 02/05/14  Yes  Blain Pais, MD  HYDROcodone-acetaminophen (NORCO/VICODIN) 5-325 MG per tablet Take 1-2 tablets by mouth every 6 (six) hours as needed for moderate pain.   Yes Historical Provider, MD  labetalol (NORMODYNE) 200 MG tablet Take 200 mg by mouth See admin instructions. Take 200 mg twice daily on sun, mon, wed, fri   Yes Historical Provider, MD  levETIRAcetam (KEPPRA) 1000 MG tablet Take 1,000 mg by mouth daily.    Yes Historical Provider, MD  LORazepam (ATIVAN) 0.5 MG tablet Take 0.5 mg by mouth daily.   Yes Historical Provider, MD  predniSONE (DELTASONE) 20 MG tablet Take 3 tablets (60 mg total) by mouth daily with breakfast. 01/29/14  Yes Francesca Oman, DO  TUBERCULIN PPD ID Inject 0.1 mLs into the skin daily. For 10 days, to be given on evening  shift   Yes Historical Provider, MD   BP 193/83  Pulse 86  Temp(Src) 97.8 F (36.6 C) (Oral)  Resp 13  Ht 5\' 9"  (1.753 m)  Wt 140 lb (63.504 kg)  BMI 20.67 kg/m2  SpO2 99% Physical Exam  Nursing note and vitals reviewed. Constitutional: [name] oriented to person, place, and time. He appears well-developed and well-nourished.  HENT:  Head: Normocephalic and atraumatic.  Right Ear: External ear normal.  Left Ear: External ear normal.  Eyes: Conjunctivae and EOM are normal. Pupils are equal, round, and reactive to light.  Left eye is deviated laterally. The patient will not let me evaluate his left eye because it is "messed up".  Neck: Normal range of motion and phonation normal. Neck supple.  Cardiovascular: Normal rate, regular rhythm and normal heart sounds.   Pulmonary/Chest: Effort normal and breath sounds normal. He exhibits no bony tenderness.  Abdominal: Soft. There is no tenderness.  Musculoskeletal: Normal range of motion.  Neurological: [name] alert and oriented to person, place, and time. No cranial nerve deficit or sensory deficit. He exhibits normal muscle tone. Coordination normal.  Skin: Skin is warm, dry and intact.   Psychiatric: He has a normal mood and affect. His behavior is normal. Judgment and thought content normal.    ED Course  Procedures (including critical care time) Medications - No data to display  Patient Vitals for the past 24 hrs:  BP Temp Temp src Pulse Resp SpO2 Height Weight  02/15/14 1115 193/83 mmHg - - 86 13 99 % - -  02/15/14 1100 196/81 mmHg - - 86 17 96 % - -  02/15/14 1039 198/81 mmHg - - 85 15 100 % - -  02/15/14 1000 195/85 mmHg - - 88  20 97 % - -  02/15/14 0945 195/86 mmHg - - 88 20 99 % - -  02/15/14 0930 192/82 mmHg - - 85 14 96 % - -  02/15/14 0915 197/83 mmHg - - 86 16 97 % - -  02/15/14 0900 195/83 mmHg - - 85 - 100 % - -  02/15/14 0845 203/79 mmHg - - 86 15 99 % - -  02/15/14 0843 221/90 mmHg - - 86 16 - - -  02/15/14 0838 221/90 mmHg 97.8 F (36.6 C) Oral 86 26 99 % 5\' 9"  (1.753 m) 140 lb (63.504 kg)   11:04- days. Discuss with Dr. Joelyn Oms, nephrology, who will arrange for dialysis of this patient.    11:49 AM Reevaluation with update and discussion. After initial assessment and treatment, an updated evaluation reveals patient passed a large dark stool that was guaiac positive. At this time he is comfortable, blood pressure is stable, and he is eating lunch. He continues to be moderately cantankerous. He states that he has had GI bleeding before." No murmurs, put anything up my butt again." Head. Arrangements have been made for dialysis, will admit to a medical service for further treatment as needed. Deandrew Hoecker L   Labs Review Labs Reviewed  CBC WITH DIFFERENTIAL - Abnormal; Notable for the following:    RBC 3.10 (*)    Hemoglobin 8.9 (*)    HCT 26.5 (*)    RDW 15.7 (*)    Neutrophils Relative % 81 (*)    All other components within normal limits  I-STAT CHEM 8, ED - Abnormal; Notable for the following:    Potassium 6.3 (*)    BUN >140 (*)    Creatinine, Ser 11.30 (*)    Glucose, Bld 137 (*)    Hemoglobin 9.5 (*)    HCT 28.0 (*)    All other  components within normal limits  POC OCCULT BLOOD, ED - Abnormal; Notable for the following:    Fecal Occult Bld POSITIVE (*)    All other components within normal limits  URINE RAPID DRUG SCREEN (HOSP PERFORMED)    Imaging Review Dg Chest 2 View  02/15/2014   CLINICAL DATA:  Hypertension; renal failure  EXAM: CHEST  2 VIEW  COMPARISON:  None.  FINDINGS: There is no edema or consolidation. Heart is borderline enlarged with pulmonary vascularity within normal limits. No adenopathy. There is evidence of prior trauma in the right shoulder region with arthropathy in this area.  IMPRESSION: Heart borderline enlarged.  No edema or consolidation.   Electronically Signed   By: Lowella Grip M.D.   On: 02/15/2014 10:53     EKG Interpretation   Date/Time:  Friday February 15 2014 08:40:12 EDT Ventricular Rate:  86 PR Interval:  164 QRS Duration: 100 QT Interval:  370 QTC Calculation: 442 R Axis:   26 Text Interpretation:  Sinus rhythm Probable left atrial enlargement LVH  with secondary repolarization abnormality Anterior ST elevation, probably  due to LVH since last tracing no significant change Confirmed by Eulis Foster   MD, Keona Bilyeu ([id number]) on 02/15/2014 8:45:28 AM      MDM   Final diagnoses:  Hypertension  Hyperkalemia  End stage renal disease  Rectal bleeding    Hypertension with end-stage renal disease and hyperkalemia, currently noncompliant of treatments with dialysis. Apparently ongoing left eye inflammation, being treated as optic neuritis. Incidental rectal bleeding, with drop in hemoglobin as compared to prior. Slowly, to be monitored after dialysis, as there may be a  dilutional effect. He has been evaluated in the past (5/15), by Dr. Hilarie Fredrickson, for occult GI bleeding. It is not clear what evaluation he has had done.  Nursing Notes Reviewed/ Care Coordinated, and agree without changes. Applicable Imaging Reviewed.  Interpretation of Laboratory Data incorporated into ED  treatment  Plan: Dialysis today  Richarda Blade, MD 02/17/14 1431

## 2014-02-15 NOTE — Progress Notes (Signed)
Patient arrived around 2145 suffering from nausea and head ache medicated both conditions, now resting comfortable will continue to monitor.

## 2014-02-15 NOTE — Consult Note (Signed)
Indication for Consultation:  Management of ESRD/hemodialysis; anemia, hypertension/volume and secondary hyperparathyroidism  HPI: [name] is a 58 y.o. male who presented to the ED from SNF because he was told his 'BP was not right.' He receives HD TTS @ [facility], last HD 10/27 ran for only 2 hours. He states HD hates HD because it makes him feel bad and that's why he didn't go yesterday. He was recently admitted last week for L eye pain, secondary to optic perineuritis- denies eye pain when asked now. He was hypertensive during that admission, norvasc 5 was started and edw lowered at [facility]. Pt is a poor historian and doesn't not know any of his medications. K+ 6.3 and hypertensive, will arrange HD today if pt will allow. Currently refusing physical exam and angry about being in hospital. Reports he wants to stop HD, but also states his mind is playing tricks on him, possibly confusion d/t uremia, also has history of cocaine abuse.  He states yesterday his facility got mad at him because he took a bus and was gone for hours just riding around Per outpt center pt has never talked about stopping HD before and that in the last few weeks is not at his baseline. For now pt agrees for HD today and discussion about goals later.   Past Medical History  Diagnosis Date  . Hypertension   . Anemia, chronic disease   . CHF (congestive heart failure)     diastolic.  EF 60 - 65% per [facility] eco 11/2011  . Cocaine abuse     mentioned in notes from [place]  . Hepatitis C antibody test positive     was HIV negative, 02/28/12  . Hepatitis B core antibody positive     03/01/10  . Positive QuantiFERON-TB Gold test     11/2011  . Helicobacter pylori gastritis     not defined if this was treated  . Polyp of colon, adenomatous     May 2012.  Dr Trenton Founds in [place]  . Shortness of breath   . Hematochezia   . Head injury, closed, with concussion   . History of blood transfusion     "last one was 2 days ago"  (11/09/2012)  . Type II diabetes mellitus     on oral pills only  . Arthritis     "right shoulder" (11/09/2012)  . Seizure disorder     questionable history of - will need to clarify with PCP  . Headache(784.0)     "q other day" (11/09/2012)  . ESRD on hemodialysis since 2012    Patient says he started HD in 2012.  ESRD was due to "drugs", primarily used cocaine.  Has 3-5 year hx of HTN, no DM.  Gets HD on TTS schedule at [facility].  [name] from [place] and recently moved here.    . Hypercalcemia   . Hyperpotassemia   . Seizures    Past Surgical History  Procedure Laterality Date  . Shoulder open rotator cuff repair Right   . Total knee arthroplasty Left   . Bascilic vein transposition  03/07/2012    Procedure: BASCILIC VEIN TRANSPOSITION;  Surgeon: Conrad Culebra, MD;  Location: [facility];  Service: Vascular;  Laterality: Left;  First Stage  . Insertion of dialysis catheter      right chest  . Bascilic vein transposition Left 05/31/2012    Procedure: BASCILIC VEIN TRANSPOSITION;  Surgeon: Conrad [place], MD;  Location: [place];  Service:  Vascular;  Laterality: Left;  Left 2nd Stage Basilic Vein Transposition with gortex graft revision using 56mmx10cm graft  . Shoulder open rotator cuff repair Right   . [name] capsule study N/A 08/27/2013    Procedure: [name] CAPSULE STUDY;  Surgeon: Milus Banister, MD;  Location: [place];  Service: Endoscopy;  Laterality: N/A;   Family History  Problem Relation Age of Onset  . Diabetes Mother   . Hypertension Mother   . Stroke Mother   . Kidney failure Mother   . Cancer Father    Social History:  reports that he has been smoking Cigarettes.  He has a 6.25 pack-year smoking history. He has never used smokeless tobacco. He reports that he uses illicit drugs ("Crack" cocaine and Marijuana). He reports that he does not drink alcohol. Allergies  Allergen Reactions  . Reglan [Metoclopramide] Other (See Comments)    Tardive dyskinesia in 11/2011 in  [place]   Prior to Admission medications   Medication Sig Start Date End Date Taking? Authorizing Provider  amLODipine (NORVASC) 5 MG tablet Take 5 mg by mouth 4 (four) times a week. Mon, wed, fri, sat   Yes Historical Provider, MD  calcium acetate (PHOSLO) 667 MG capsule Take 1,334 mg by mouth 3 (three) times daily with meals.    Yes Historical Provider, MD  calcium acetate (PHOSLO) 667 MG capsule Take 1 capsule (667 mg total) by mouth as needed (with snacks). 02/05/14  Yes Blain Pais, MD  HYDROcodone-acetaminophen (NORCO/VICODIN) 5-325 MG per tablet Take 1-2 tablets by mouth every 6 (six) hours as needed for moderate pain.   Yes Historical Provider, MD  labetalol (NORMODYNE) 200 MG tablet Take 200 mg by mouth See admin instructions. Take 200 mg twice daily on sun, mon, wed, fri   Yes Historical Provider, MD  levETIRAcetam (KEPPRA) 1000 MG tablet Take 1,000 mg by mouth daily.    Yes Historical Provider, MD  LORazepam (ATIVAN) 0.5 MG tablet Take 0.5 mg by mouth daily.   Yes Historical Provider, MD  predniSONE (DELTASONE) 20 MG tablet Take 3 tablets (60 mg total) by mouth daily with breakfast. 01/29/14  Yes Francesca Oman, DO  TUBERCULIN PPD ID Inject 0.1 mLs into the skin daily. For 10 days, to be given on evening  shift   Yes Historical Provider, MD   No current facility-administered medications for this encounter.   Current Outpatient Prescriptions  Medication Sig Dispense Refill  . amLODipine (NORVASC) 5 MG tablet Take 5 mg by mouth 4 (four) times a week. Mon, wed, fri, sat      . calcium acetate (PHOSLO) 667 MG capsule Take 1,334 mg by mouth 3 (three) times daily with meals.       . calcium acetate (PHOSLO) 667 MG capsule Take 1 capsule (667 mg total) by mouth as needed (with snacks).  90 capsule  0  . HYDROcodone-acetaminophen (NORCO/VICODIN) 5-325 MG per tablet Take 1-2 tablets by mouth every 6 (six) hours as needed for moderate pain.      Marland Kitchen labetalol (NORMODYNE) 200 MG tablet  Take 200 mg by mouth See admin instructions. Take 200 mg twice daily on sun, mon, wed, fri      . levETIRAcetam (KEPPRA) 1000 MG tablet Take 1,000 mg by mouth daily.       Marland Kitchen LORazepam (ATIVAN) 0.5 MG tablet Take 0.5 mg by mouth daily.      . predniSONE (DELTASONE) 20 MG tablet Take 3 tablets (60 mg total) by mouth daily with breakfast.  90 tablet  0  . TUBERCULIN PPD ID Inject 0.1 mLs into the skin daily. For 10 days, to be given on evening  shift       Labs: Basic Metabolic Panel:  Recent Labs Lab 02/15/14 0905  NA 140  K 6.3*  CL 107  GLUCOSE 137*  BUN >140*  CREATININE 11.30*   Liver Function Tests: No results found for this basename: AST, ALT, ALKPHOS, BILITOT, PROT, ALBUMIN,  in the last 168 hours No results found for this basename: LIPASE, AMYLASE,  in the last 168 hours No results found for this basename: AMMONIA,  in the last 168 hours CBC:  Recent Labs Lab 02/15/14 0847 02/15/14 0905  WBC 7.6  --   NEUTROABS 6.1  --   HGB 8.9* 9.5*  HCT 26.5* 28.0*  MCV 85.5  --   PLT 157  --    Cardiac Enzymes: No results found for this basename: CKTOTAL, CKMB, CKMBINDEX, TROPONINI,  in the last 168 hours CBG: No results found for this basename: GLUCAP,  in the last 168 hours Iron Studies: No results found for this basename: IRON, TIBC, TRANSFERRIN, FERRITIN,  in the last 72 hours Studies/Results: Dg Chest 2 View  02/15/2014   CLINICAL DATA:  Hypertension; renal failure  EXAM: CHEST  2 VIEW  COMPARISON:  None.  FINDINGS: There is no edema or consolidation. Heart is borderline enlarged with pulmonary vascularity within normal limits. No adenopathy. There is evidence of prior trauma in the right shoulder region with arthropathy in this area.  IMPRESSION: Heart borderline enlarged.  No edema or consolidation.   Electronically Signed   By: Lowella Grip M.D.   On: 02/15/2014 10:53    Review of Systems: refusing to answer most questions Denies SOB and chest pain.  Reports  'mind is playing tricks on me' but unable to explain how Denies eye pain    Physical Exam: Filed Vitals:   02/15/14 1000 02/15/14 1039 02/15/14 1100 02/15/14 1115  BP: 195/85 198/81 196/81 193/83  Pulse: 88 85 86 86  Temp:      TempSrc:      Resp: 20 15 17 13   Height:      Weight:      SpO2: 97% 100% 96% 99%     General: No acute distress. Eating lunch. Refusing most of exam Head: L eye closed/ edema Lungs:  Breathing is unlabored. Lower extremities:without edema Neuro: Oriented to person and place.  Psych:  Angry and agitated. Dialysis Access:  L AVF  Dialysis Orders:  TTS @ [facility] 4 hours   57kgs  2K/2.25  400/1.5   No Heparin  L AVF Hectorol 2   Venofer 50 q week (last dose 10/22)    No esa  Assessment/Plan: 1.  HTN- SBP >200 at SNF and when presented to the ED. secondary to missed HD. ? compliance with meds. History of cocaine abuse- drug screen pending. norvasc started last admission, hydralazine.  2.  ESRD -  TTS @ [facility], missed yesterday, ran only 2 hours Tuesday. HD pending today. K+ 6.3 3.  volume  - no edema on chest xray.  6.5kgs over EDW 4.  Anemia  - hgb 9.5- down from 11.3 on 10/20, start low dose ESA today and cont FE- watch CBC 5.  Metabolic bone disease - cont phoslo and hectorol- last Ca+ 8.5 and phos 4.1 6.  Nutrition -renal diet and multivit 7. Substance abuse Stool - heme +. No heparin in HD. Watch CBC  8. Left eye pain- was being followed by opthalmology  9. dispo- consult palliative care. From [place], NP [place] [phone] 02/15/2014, 12:02 PM

## 2014-02-15 NOTE — Progress Notes (Signed)
Patient experiencing sharp chest pain on hemodialysis. 4.8 liters of fluid removed at the onset of chest pain with a blood pressure of 212/106. Pain rated by patient at a level of 10. Dr Joelyn Oms paged. Nitro 0.4mg  x3 ordered, Stat troponin and ekg, with 20 mg of Hydralazine. Chest pain resolved with second dose of Nitro 0.4mg  at 1904. Blood pressure has improved 176/85 HR 89 bpm. No further complaints by patient. Rapid Response at bedside.

## 2014-02-15 NOTE — H&P (Signed)
[facility] History and Physical  Kenai Fluegel WUJ:[id number] DOB: 1955/06/14 DOA: 02/15/2014  Referring physician: Dr Joelyn Oms PCP: Windy Kalata, MD   Chief Complaint: Chest pain  HPI: [name] is a 58 y.o. male  SNF resident w/ PMH of HTN anemia, dCHF, polysubstance abuse, Hep C, Hep B, DM, Szrs, ESRD, presenting to the [facility] dialysis center afdter missing dialysis for more than 1 week. [name] given dialysis and noted to be hypertensive. This was managed in dialysis by Dr. Joelyn Oms. Prior to the end of his treatment the [name] stated that he developed chest pain. [name] given nitro adn hydralazine and CP resolved. Dr. Joelyn Oms has asked that the [name] be admitted to our service for overnight observation and CP r/o. He will get dialysis in the morning.   [name] currently complaining about being in the hospital and having so many people bothering him. He denies ever having chest pain but also states that his memory is not the best. He hates gettign dialysis but does not wish to die at this time. [name] understands the necessity of dialysis.    Review of Systems:  Constitutional:  No weight loss, night sweats, Fevers, chills HEENT:  No difficulty swallowing,  No sneezing, itching, ear ache, nasal congestion, post nasal drip,  Cardio-vascular:  chest pain, No Orthopnea, PND, swelling in lower extremities, anasarca, dizziness, palpitations  GI:  No heartburn, indigestion, abdominal pain, nausea, vomiting, diarrhea, change in bowel habits, loss of appetite  Resp:  No shortness of breath with exertion or at rest. No excess mucus, no productive cough, No non-productive cough, No coughing up of blood.No change in color of mucus.No wheezing.No chest wall deformity  Skin:  no rash or lesions.  GU:  no dysuria, change in color of urine, no urgency or frequency. No flank pain.  Musculoskeletal:  No joint pain or swelling. No decreased range of motion. No back pain.  Psych:  No change in mood or  affect. No depression or anxiety. No memory loss.   Past Medical History  Diagnosis Date  . Hypertension   . Anemia, chronic disease   . CHF (congestive heart failure)     diastolic.  EF 60 - 65% per [facility] 11/2011  . Cocaine abuse     mentioned in notes from [place]  . Hepatitis C antibody test positive     was HIV negative, 02/28/12  . Hepatitis B core antibody positive     03/01/10  . Positive QuantiFERON-TB Gold test     11/2011  . Helicobacter pylori gastritis     not defined if this was treated  . Polyp of colon, adenomatous     May 2012.  Dr Trenton Founds in [place]  . Shortness of breath   . Hematochezia   . Head injury, closed, with concussion   . History of blood transfusion     "last one was 2 days ago" (11/09/2012)  . Type II diabetes mellitus     on oral pills only  . Arthritis     "right shoulder" (11/09/2012)  . Seizure disorder     questionable history of - will need to clarify with PCP  . Headache(784.0)     "q other day" (11/09/2012)  . ESRD on hemodialysis since 2012    Patient says he started HD in 2012.  ESRD was due to "drugs", primarily used cocaine.  Has 3-5 year hx of HTN, no DM.  Gets HD on TTS schedule at [place].  [name]  from [place] and recently moved here.    . Hypercalcemia   . Hyperpotassemia   . Seizures    Past Surgical History  Procedure Laterality Date  . Shoulder open rotator cuff repair Right   . Total knee arthroplasty Left   . Bascilic vein transposition  03/07/2012    Procedure: BASCILIC VEIN TRANSPOSITION;  Surgeon: Conrad [place], MD;  Location: [place];  Service: Vascular;  Laterality: Left;  First Stage  . Insertion of dialysis catheter      right chest  . Bascilic vein transposition Left 05/31/2012    Procedure: BASCILIC VEIN TRANSPOSITION;  Surgeon: Conrad [place], MD;  Location: [place];  Service: Vascular;  Laterality: Left;  Left 2nd Stage Basilic Vein Transposition with gortex graft revision using 52mmx10cm graft  .  Shoulder open rotator cuff repair Right   . [name] capsule study N/A 08/27/2013    Procedure: [name] CAPSULE STUDY;  Surgeon: Milus Banister, MD;  Location: [place];  Service: Endoscopy;  Laterality: N/A;   Social History:  reports that he has been smoking Cigarettes.  He has a 6.25 pack-year smoking history. He has never used smokeless tobacco. He reports that he uses illicit drugs ("Crack" cocaine and Marijuana). He reports that he does not drink alcohol.  Allergies  Allergen Reactions  . Reglan [Metoclopramide] Other (See Comments)    Tardive dyskinesia in 11/2011 in [place]    Family History  Problem Relation Age of Onset  . Diabetes Mother   . Hypertension Mother   . Stroke Mother   . Kidney failure Mother   . Cancer Father      Prior to Admission medications   Medication Sig Start Date End Date Taking? Authorizing Provider  amLODipine (NORVASC) 5 MG tablet Take 5 mg by mouth 4 (four) times a week. Mon, wed, fri, sat   Yes Historical Provider, MD  calcium acetate (PHOSLO) 667 MG capsule Take 1,334 mg by mouth 3 (three) times daily with meals.    Yes Historical Provider, MD  calcium acetate (PHOSLO) 667 MG capsule Take 1 capsule (667 mg total) by mouth as needed (with snacks). 02/05/14  Yes Blain Pais, MD  HYDROcodone-acetaminophen (NORCO/VICODIN) 5-325 MG per tablet Take 1-2 tablets by mouth every 6 (six) hours as needed for moderate pain.   Yes Historical Provider, MD  labetalol (NORMODYNE) 200 MG tablet Take 200 mg by mouth See admin instructions. Take 200 mg twice daily on sun, mon, wed, fri   Yes Historical Provider, MD  levETIRAcetam (KEPPRA) 1000 MG tablet Take 1,000 mg by mouth daily.    Yes Historical Provider, MD  LORazepam (ATIVAN) 0.5 MG tablet Take 0.5 mg by mouth daily.   Yes Historical Provider, MD  predniSONE (DELTASONE) 20 MG tablet Take 3 tablets (60 mg total) by mouth daily with breakfast. 01/29/14  Yes Francesca Oman, DO  TUBERCULIN PPD ID Inject  0.1 mLs into the skin daily. For 10 days, to be given on evening  shift   Yes Historical Provider, MD   Physical Exam: Filed Vitals:   02/15/14 1853 02/15/14 1904 02/15/14 1908 02/15/14 1938  BP: 166/86 199/101 212/106 176/85  Pulse: 87  89 92  Temp:      TempSrc:   Oral Oral  Resp: 17  19 17   Height:      Weight:      SpO2:   100% 100%    Wt Readings from Last 3 Encounters:  02/15/14 63.504 kg (140 lb)  02/05/14 57.2 kg (126 lb 1.7 oz)  01/29/14 59.9 kg (132 lb 0.9 oz)    General: appears frail  Eyes: PERRL, L ptosis - baseline per [name]. , nml irises & conjunctiva [id number] normal hearing, lips & tongue Neck:  no LAD, masses or thyromegaly Cardiovascular:  RRR, II/VI systolic murmur. No LE edema. Telemetry:  SR, no arrhythmias  Respiratory:  CTA bilaterally, no w/r/r. Normal respiratory effort. Abdomen:  soft, ntnd Skin: Dry and scaly, no wounds Musculoskeletal:  grossly normal tone BUE/BLE Psychiatric:  grossly normal mood and affect, speech fluent and appropriate Neurologic:  grossly non-focal.          Labs on Admission:  Basic Metabolic Panel:  Recent Labs Lab 02/15/14 0905  NA 140  K 6.3*  CL 107  GLUCOSE 137*  BUN >140*  CREATININE 11.30*   Liver Function Tests: No results found for this basename: AST, ALT, ALKPHOS, BILITOT, PROT, ALBUMIN,  in the last 168 hours No results found for this basename: LIPASE, AMYLASE,  in the last 168 hours No results found for this basename: AMMONIA,  in the last 168 hours CBC:  Recent Labs Lab 02/15/14 0847 02/15/14 0905  WBC 7.6  --   NEUTROABS 6.1  --   HGB 8.9* 9.5*  HCT 26.5* 28.0*  MCV 85.5  --   PLT 157  --    Cardiac Enzymes:  Recent Labs Lab 02/15/14 1832  TROPONINI <0.30    BNP (last 3 results) No results found for this basename: PROBNP,  in the last 8760 hours CBG:  Recent Labs Lab 02/15/14 1804  GLUCAP 136*    Radiological Exams on Admission: Dg Chest 2 View  02/15/2014   CLINICAL  DATA:  Hypertension; renal failure  EXAM: CHEST  2 VIEW  COMPARISON:  None.  FINDINGS: There is no edema or consolidation. Heart is borderline enlarged with pulmonary vascularity within normal limits. No adenopathy. There is evidence of prior trauma in the right shoulder region with arthropathy in this area.  IMPRESSION: Heart borderline enlarged.  No edema or consolidation.   Electronically Signed   By: Lowella Grip M.D.   On: 02/15/2014 10:53    EKG: Independently reviewed.   Normal sinus rhythm Left ventricular hypertrophy with repolarization abnormality Possible Septal infarct , age undetermined  Unchanged from previous   Assessment/Plan Active Problems:   Chest pain ESRD Diastolic CHF Polysubstance abuse Medical non-compliance Hepatitis C and B Diabetes HTN Seizures Hyperkalemia  CHest pain: [name] w/ cardiac risk factors and in hypertensive urgency at time of possible chest pain. Troponin neg. EKG unchanged from previous w/o evidence of acute ischemia.  - Tele for obs - cycle troponins -   ESRD: dialysis today (after missing for 1wk). Dr. Joelyn Oms said [name] will likely receive one more round of dialysis on 02/16/14 prior to DC.  - Continue phoslo - CMET and DBD in am  Polysubstance abuse:  - UDS  HTN: 176/85. Hydralazine and nitro given in dialysis as well as 4-5 liters taken off during dialysis.  - restart home norvasc and labetalol. - hydralazine PRN SBP >180  DIabetes: historical Dx ???? Last A1c 2014 of 5.0. Not currently on medications - A1c - Sensitive - SSI if needed  Seizures:  - continue keppra  Code Status: FULL DVT Prophylaxis: Hep [place] TID Family Communication: none Disposition Plan: pending dialysis  Time spent: >70 min  [place] [place] [facility] [email or web address] Password [email or web address]

## 2014-02-15 NOTE — ED Notes (Signed)
Per [facility] Patient has a history of Hypertension in the morning. EMS states Faclity states BP was 206/90 and when EMS checked it was 224/100. Patient is a dialysis patient and missed yesterday because of hypertension. Patient was given lorazepam, clonopin, labetalol.

## 2014-02-15 NOTE — Consult Note (Signed)
I saw the patient and agree with the above assessment and plan.    Pt struggling with adherence to outpt HD.  Presented to ED today after not coming to HD yesterday at [facility]>  HTN and >6kg above EDW.  Has Hb below prev values, but doubt ESA given as written and underdialyzed.  FOBT positive.    He has mild hyperkalemia.  He has some interest in stopping HD but upon further discussion is just frustrated with his care and situation.  We discussed that full adherence and perhaps extended his time would likely make him better.  He will consider.  Pt likely can be dc after HD to SNF and f/u with Dr. Mercy Moore at [facility] next week.  Will check Hb at outpt center next Tx.

## 2014-02-15 NOTE — ED Notes (Signed)
Patient states he has had 3 loose stools since last night.

## 2014-02-15 NOTE — ED Notes (Addendum)
error 

## 2014-02-16 ENCOUNTER — Encounter (HOSPITAL_COMMUNITY): Payer: Self-pay | Admitting: Physician Assistant

## 2014-02-16 DIAGNOSIS — I119 Hypertensive heart disease without heart failure: Secondary | ICD-10-CM | POA: Diagnosis present

## 2014-02-16 DIAGNOSIS — R112 Nausea with vomiting, unspecified: Secondary | ICD-10-CM

## 2014-02-16 DIAGNOSIS — H469 Unspecified optic neuritis: Secondary | ICD-10-CM

## 2014-02-16 DIAGNOSIS — G4700 Insomnia, unspecified: Secondary | ICD-10-CM

## 2014-02-16 DIAGNOSIS — Z515 Encounter for palliative care: Secondary | ICD-10-CM

## 2014-02-16 DIAGNOSIS — R630 Anorexia: Secondary | ICD-10-CM

## 2014-02-16 DIAGNOSIS — R7989 Other specified abnormal findings of blood chemistry: Secondary | ICD-10-CM

## 2014-02-16 DIAGNOSIS — I132 Hypertensive heart and chronic kidney disease with heart failure and with stage 5 chronic kidney disease, or end stage renal disease: Secondary | ICD-10-CM | POA: Diagnosis not present

## 2014-02-16 DIAGNOSIS — N186 End stage renal disease: Secondary | ICD-10-CM

## 2014-02-16 DIAGNOSIS — I10 Essential (primary) hypertension: Secondary | ICD-10-CM

## 2014-02-16 LAB — RAPID URINE DRUG SCREEN, HOSP PERFORMED
AMPHETAMINES: NOT DETECTED
BARBITURATES: NOT DETECTED
Benzodiazepines: NOT DETECTED
COCAINE: NOT DETECTED
Opiates: POSITIVE — AB
Tetrahydrocannabinol: NOT DETECTED

## 2014-02-16 LAB — TROPONIN I: Troponin I: 0.33 ng/mL (ref <0.30)

## 2014-02-16 LAB — CBC
HCT: 26.5 % — ABNORMAL LOW (ref 39.0–52.0)
Hemoglobin: 8.8 g/dL — ABNORMAL LOW (ref 13.0–17.0)
MCH: 28.4 pg (ref 26.0–34.0)
MCHC: 33.2 g/dL (ref 30.0–36.0)
MCV: 85.5 fL (ref 78.0–100.0)
PLATELETS: 167 10*3/uL (ref 150–400)
RBC: 3.1 MIL/uL — ABNORMAL LOW (ref 4.22–5.81)
RDW: 15.3 % (ref 11.5–15.5)
WBC: 6.5 10*3/uL (ref 4.0–10.5)

## 2014-02-16 LAB — GLUCOSE, CAPILLARY
GLUCOSE-CAPILLARY: 100 mg/dL — AB (ref 70–99)
Glucose-Capillary: 121 mg/dL — ABNORMAL HIGH (ref 70–99)
Glucose-Capillary: 85 mg/dL (ref 70–99)
Glucose-Capillary: 101 mg/dL — ABNORMAL HIGH (ref 70–99)

## 2014-02-16 LAB — COMPREHENSIVE METABOLIC PANEL
ALT: 32 U/L (ref 0–53)
AST: 22 U/L (ref 0–37)
Albumin: 2.9 g/dL — ABNORMAL LOW (ref 3.5–5.2)
Alkaline Phosphatase: 45 U/L (ref 39–117)
Anion gap: 11 (ref 5–15)
BUN: 34 mg/dL — ABNORMAL HIGH (ref 6–23)
CO2: 29 meq/L (ref 19–32)
Calcium: 8.2 mg/dL — ABNORMAL LOW (ref 8.4–10.5)
Chloride: 99 mEq/L (ref 96–112)
Creatinine, Ser: 4.35 mg/dL — ABNORMAL HIGH (ref 0.50–1.35)
GFR, EST AFRICAN AMERICAN: 16 mL/min — AB (ref >90)
GFR, EST NON AFRICAN AMERICAN: 14 mL/min — AB (ref >90)
GLUCOSE: 90 mg/dL (ref 70–99)
Potassium: 4.7 mEq/L (ref 3.7–5.3)
SODIUM: 139 meq/L (ref 137–147)
TOTAL PROTEIN: 6.2 g/dL (ref 6.0–8.3)
Total Bilirubin: 0.4 mg/dL (ref 0.3–1.2)

## 2014-02-16 MED ORDER — HYDROCODONE-ACETAMINOPHEN 5-325 MG PO TABS
ORAL_TABLET | ORAL | Status: AC
  Filled 2014-02-16: qty 2

## 2014-02-16 MED ORDER — DOXERCALCIFEROL 4 MCG/2ML IV SOLN
INTRAVENOUS | Status: AC
  Administered 2014-02-16: 2 ug via INTRAVENOUS
  Filled 2014-02-16: qty 2

## 2014-02-16 MED ORDER — PROCHLORPERAZINE EDISYLATE 5 MG/ML IJ SOLN
10.0000 mg | Freq: Four times a day (QID) | INTRAMUSCULAR | Status: DC | PRN
  Filled 2014-02-16: qty 2

## 2014-02-16 MED ORDER — MIRTAZAPINE 15 MG PO TBDP
15.0000 mg | ORAL_TABLET | Freq: Every day | ORAL | Status: DC
  Administered 2014-02-16 – 2014-02-17 (×2): 15 mg via ORAL
  Filled 2014-02-16 (×3): qty 1

## 2014-02-16 NOTE — Progress Notes (Signed)
UR completed 

## 2014-02-16 NOTE — Progress Notes (Signed)
Critical troponin called and [facility] on call paged. Called back and will let attending know. Patient denies chest pain. Had an episode of nausea and vomiting. Relieved with Zofran.

## 2014-02-16 NOTE — Progress Notes (Signed)
Chaplain attempted visit with pt. Chaplain noticed pt appeared sleepy and had lights off and offered to come back at another time. Pt said coming at another time would be better. Chaplain will refer this to another chaplain. Page chaplain if needed sooner.  02/16/14 1700  Clinical Encounter Type  Visited With Patient  Visit Type Initial;Spiritual support  Referral From Palliative care team  Consult/Referral To Chaplain  Recommendations Check back another time  Marcelino Scot 02/16/2014 5:23 PM

## 2014-02-16 NOTE — Progress Notes (Signed)
Called to HD per HD RN for dialysis pt complaining of chest pain. Upon my arrival py found  in bed anxious, denies pain. Received NTG x 2 and hydralazine prior to my arrival per HD RN. Pt to be admitted overnight for observation.[facility] MD paged per Nephology to admit. Triad MD Dr.Merrel to see pt shortly in HD prior to transfer to floor.

## 2014-02-16 NOTE — Consult Note (Signed)
The patient was seen along with Mrs. Idolina Primer. He expressed no symptoms to suggest angina. He has a trivial elevation in troponin which requires no further evaluation. Please call if we can help further.

## 2014-02-16 NOTE — Progress Notes (Signed)
Admit: 02/15/2014 LOS: 1  7M ESRD THS at [facility] via AVF presented with uncontrolled HTN, uremia, dialysis nonadherence  Subjective:  Patient remained very hypertensive despite full dialysis with ultrafiltration yesterday. Develop chest pain unit with dialysis, improved with IV hydralazine and nitroglycerin. Chest pain-free this morning. First troponins were negative and last troponin mildly above upper limit of normal Blood pressures improved this morning Seen by palliative care, is dissatisfied with dialysis but uninterested in stopping at the current time. UDS negative for cocaine    10/30 0701 - 10/31 0700 In: 3 [I.V.:3] Out: 4872 [Urine:40]  Filed Weights   02/15/14 0838 02/16/14 0455  Weight: 63.504 kg (140 lb) 63.3 kg (139 lb 8.8 oz)    Scheduled Meds: . amLODipine  5 mg Oral QHS  . calcium acetate  2,001 mg Oral TID WC  . doxercalciferol  2 mcg Intravenous Q T,Th,Sa-HD  . heparin  5,000 Units Subcutaneous 3 times per day  . hydrALAZINE  100 mg Oral 3 times per day  . insulin aspart  0-9 Units Subcutaneous TID WC  . [START ON 02/17/2014] labetalol  200 mg Oral 2 times per day on Sun Mon Wed Fri  . levETIRAcetam  1,000 mg Oral Daily  . LORazepam  0.5 mg Oral Daily  . mirtazapine  15 mg Oral QHS  . multivitamin  1 tablet Oral QHS  . predniSONE  60 mg Oral Q breakfast  . sodium chloride  3 mL Intravenous Q12H   Continuous Infusions:  PRN Meds:.acetaminophen, calcium acetate, hydrALAZINE, HYDROcodone-acetaminophen, nitroGLYCERIN, nitroGLYCERIN, ondansetron (ZOFRAN) IV, prochlorperazine  Current Labs: reviewed    Physical Exam:  Blood pressure 152/77, pulse 86, temperature 98.3 F (36.8 C), temperature source Oral, resp. rate 18, height 5\' 9"  (1.753 m), weight 63.3 kg (139 lb 8.8 oz), SpO2 98.00%. General: No acute distress.  Head: L eye closed/ edema  Lungs: Breathing is unlabored.  Lower extremities:without edema  Neuro: Oriented to person and place.  Psych:  calmer  Dialysis Access: L AVF   Assessment/Plan 1. Uncontrolled HTN / Vol 1. Related to medication and dialysis nonadherence 2. Improved with restarting medications, ultrafiltration 2. Chest pain on dialysis 1. Likely related to hypertension 2. Not present this morning 3. Troponins are largely reassuring 3. ESRD, nonadherence, Tuesday Thursday [facility] 1. Keep on treatment schedule, including today 2. Goal UF is 4 L, eventually can start targeting weight-based UF goals but limited by nonadherence 4. MBD: Continue outpatient regimen 5. Anemia: Started yesterday. Stable. 6. 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

## 2014-02-16 NOTE — Progress Notes (Signed)
[facility] PROGRESS NOTE  [name] [id number]:[id number] DOB: 1955-12-28 DOA: 02/15/2014 PCP: Windy Kalata, MD Interim summary: [name] is a 58 y.o. male  SNF resident w/ PMH of HTN anemia, dCHF, polysubstance abuse, Hep C, Hep B, DM, Szrs, ESRD, presenting to the [facility] dialysis center afdter missing dialysis for more than 1 week. Pt given dialysis and noted to be hypertensive. This was managed in dialysis by Dr. Joelyn Oms. Prior to the end of his treatment the pt stated that he developed chest pain. Admitted to medical service for further evaluation.   Assessment/Plan: 1. Chest pain : slightly elevated troponin. Currently pain free. EKG unchagned.  ESRD on HD: Resume HD today as per renal.    Hypertensive urgency: Resume home meds.   Diet controlled DM: Stable.   Seizures: Resume keppra  Code Status: fullc ode.  Family Communication: none at bedside Disposition Plan: pending.    Consultants:  CARDIOLOGY  Palliative care  Nephrology.  Procedures:  HD  Antibiotics:  none  HPI/Subjective: No chest pain, slightly nauseated.   Objective: Filed Vitals:   02/16/14 1438  BP: 118/53  Pulse: 84  Temp:   Resp:     Intake/Output Summary (Last 24 hours) at 02/16/14 1542 Last data filed at 02/16/14 0537  Gross per 24 hour  Intake      3 ml  Output   4872 ml  Net  -4869 ml   Filed Weights   02/15/14 0838 02/16/14 0455 02/16/14 1137  Weight: 63.504 kg (140 lb) 63.3 kg (139 lb 8.8 oz) 52.9 kg (116 lb 10 oz)    Exam:   General:  Alert afebrile comfortable  Cardiovascular: s1s2  Respiratory: ctab  Abdomen: soft NT ND BS+  Musculoskeletal: PEDAL EDEMA.   Data Reviewed: Basic Metabolic Panel:  Recent Labs Lab 02/15/14 0905 02/16/14 0442  NA 140 139  K 6.3* 4.7  CL 107 99  CO2  --  29  GLUCOSE 137* 90  BUN >140* 34*  CREATININE 11.30* 4.35*  CALCIUM  --  8.2*   Liver Function Tests:  Recent Labs Lab 02/16/14 0442  AST 22   ALT 32  ALKPHOS 45  BILITOT 0.4  PROT 6.2  ALBUMIN 2.9*   No results found for this basename: LIPASE, AMYLASE,  in the last 168 hours No results found for this basename: AMMONIA,  in the last 168 hours CBC:  Recent Labs Lab 02/15/14 0847 02/15/14 0905 02/16/14 0442  WBC 7.6  --  6.5  NEUTROABS 6.1  --   --   HGB 8.9* 9.5* 8.8*  HCT 26.5* 28.0* 26.5*  MCV 85.5  --  85.5  PLT 157  --  167   Cardiac Enzymes:  Recent Labs Lab 02/15/14 1832 02/16/14 0048 02/16/14 0442  TROPONINI <0.30 <0.30 0.33*   BNP (last 3 results) No results found for this basename: PROBNP,  in the last 8760 hours CBG:  Recent Labs Lab 02/15/14 1804 02/15/14 2215 02/16/14 0608 02/16/14 1131  GLUCAP 136* 149* 100* 121*    No results found for this or any previous visit (from the past 240 hour(s)).   Studies: Dg Chest 2 View  02/15/2014   CLINICAL DATA:  Hypertension; renal failure  EXAM: CHEST  2 VIEW  COMPARISON:  None.  FINDINGS: There is no edema or consolidation. Heart is borderline enlarged with pulmonary vascularity within normal limits. No adenopathy. There is evidence of prior trauma in the right shoulder region with arthropathy in this area.  IMPRESSION: Heart  borderline enlarged.  No edema or consolidation.   Electronically Signed   By: Lowella Grip M.D.   On: 02/15/2014 10:53    Scheduled Meds: . amLODipine  5 mg Oral QHS  . calcium acetate  2,001 mg Oral TID WC  . doxercalciferol      . doxercalciferol  2 mcg Intravenous Q T,Th,Sa-HD  . heparin  5,000 Units Subcutaneous 3 times per day  . hydrALAZINE  100 mg Oral 3 times per day  . HYDROcodone-acetaminophen      . insulin aspart  0-9 Units Subcutaneous TID WC  . [START ON 02/17/2014] labetalol  200 mg Oral 2 times per day on Sun Mon Wed Fri  . levETIRAcetam  1,000 mg Oral Daily  . LORazepam  0.5 mg Oral Daily  . mirtazapine  15 mg Oral QHS  . multivitamin  1 tablet Oral QHS  . predniSONE  60 mg Oral Q breakfast  .  sodium chloride  3 mL Intravenous Q12H   Continuous Infusions:   Active Problems:   Chest pain    Time spent: 25 minutes.     [facility] Pager [phone]. If 7PM-7AM, please contact night-coverage at [email or web address], password [facility] 02/16/2014, 3:42 PM  LOS: 1 day

## 2014-02-16 NOTE — Progress Notes (Signed)
INITIAL NUTRITION ASSESSMENT  DOCUMENTATION CODES Per approved criteria  -Not Applicable   INTERVENTION: High Protein snack between meals.  RD to follow nutrition care  NUTRITION DIAGNOSIS: Increased protein-energy requirements related to ESRD with HD as evidenced by current nutrition guidelines.   Goal: Pt to meet >/= 90% of their estimated nutrition needs    Monitor: Po intake, labs and wt trends    Reason for Assessment: Malnutrition Screen Score = 2  58 y.o. male   ASSESSMENT: 66 yoM ESRD with HD ,CHF (diastolic), DM, h/o substance abuse who presented from dialysis hypertensive and with chest pain after his treatment session. Difficulty with tolerating dialysis.  Ambulates with cane. Feeds himself. Generalized weakness. Endorses good appetite. Recent intake meals 100%. Question etiology of 7% wt loss this month related to fluid status changes? Receiving HD today. Pt refuses supplements. Encouraged meal intake and snack between meals. Reluctant participation during visit by pt.   Pt has hx of severe malnutrition per RD assessment 01/25/14.    Height: Ht Readings from Last 1 Encounters:  02/16/14 5\' 9"  (1.753 m)    Weight: Wt Readings from Last 1 Encounters:  02/16/14 116 lb 10 oz (52.9 kg)    Ideal Body Weight: 160#  % Ideal Body Weight: 73%  Wt Readings from Last 10 Encounters:  02/16/14 116 lb 10 oz (52.9 kg)  02/05/14 126 lb 1.7 oz (57.2 kg)  01/29/14 132 lb 0.9 oz (59.9 kg)  11/09/13 127 lb 6.4 oz (57.788 kg)  11/07/13 131 lb 9.6 oz (59.693 kg)  08/31/13 129 lb 8 oz (58.741 kg)  08/26/13 132 lb 15 oz (60.3 kg)  08/26/13 132 lb 15 oz (60.3 kg)  08/01/13 128 lb 6.4 oz (58.242 kg)  07/25/13 140 lb (63.504 kg)    Usual Body Weight: 130-140#  % Usual Body Weight: 90%  BMI:  Body mass index is 17.21 kg/(m^2). underweight  Estimated Nutritional Needs: Kcal: 4193 Protein: 69-80 gr Fluid: 1200 ml daily  Skin: intact  Diet Order: Renal  EDUCATION  NEEDS: -No education needs identified at this time   Intake/Output Summary (Last 24 hours) at 02/16/14 1222 Last data filed at 02/16/14 0537  Gross per 24 hour  Intake      3 ml  Output   4872 ml  Net  -4869 ml    Last BM: 10/30   Labs:   Recent Labs Lab 02/15/14 0905 02/16/14 0442  NA 140 139  K 6.3* 4.7  CL 107 99  CO2  --  29  BUN >140* 34*  CREATININE 11.30* 4.35*  CALCIUM  --  8.2*  GLUCOSE 137* 90    CBG (last 3)   Recent Labs  02/15/14 2215 02/16/14 0608 02/16/14 1131  GLUCAP 149* 100* 121*    Scheduled Meds: . amLODipine  5 mg Oral QHS  . calcium acetate  2,001 mg Oral TID WC  . doxercalciferol  2 mcg Intravenous Q T,Th,Sa-HD  . heparin  5,000 Units Subcutaneous 3 times per day  . hydrALAZINE  100 mg Oral 3 times per day  . insulin aspart  0-9 Units Subcutaneous TID WC  . [START ON 02/17/2014] labetalol  200 mg Oral 2 times per day on Sun Mon Wed Fri  . levETIRAcetam  1,000 mg Oral Daily  . LORazepam  0.5 mg Oral Daily  . mirtazapine  15 mg Oral QHS  . multivitamin  1 tablet Oral QHS  . predniSONE  60 mg Oral Q breakfast  . sodium  chloride  3 mL Intravenous Q12H    Continuous Infusions:   Past Medical History  Diagnosis Date  . Hypertension   . Anemia, chronic disease   . CHF (congestive heart failure)     diastolic.  EF 60 - 65% per [facility] eco 11/2011  . Cocaine abuse     mentioned in notes from [place]  . Hepatitis C antibody test positive     was HIV negative, 02/28/12  . Hepatitis B core antibody positive     03/01/10  . Positive QuantiFERON-TB Gold test     11/2011  . Helicobacter pylori gastritis     not defined if this was treated  . Polyp of colon, adenomatous     May 2012.  Dr Trenton Founds in [place]  . Shortness of breath   . Hematochezia   . Head injury, closed, with concussion   . History of blood transfusion     "last one was 2 days ago" (11/09/2012)  . Type II diabetes mellitus     on oral pills only  .  Arthritis     "right shoulder" (11/09/2012)  . Seizure disorder     questionable history of - will need to clarify with PCP  . Headache(784.0)     "q other day" (11/09/2012)  . ESRD on hemodialysis since 2012    Patient says he started HD in 2012.  ESRD was due to "drugs", primarily used cocaine.  Has 3-5 year hx of HTN, no DM.  Gets HD on TTS schedule at [place].  [name] from [place] and recently moved here.    . Hypercalcemia   . Hyperpotassemia   . Seizures     Past Surgical History  Procedure Laterality Date  . Shoulder open rotator cuff repair Right   . Total knee arthroplasty Left   . Bascilic vein transposition  03/07/2012    Procedure: BASCILIC VEIN TRANSPOSITION;  Surgeon: Conrad [place], MD;  Location: [place];  Service: Vascular;  Laterality: Left;  First Stage  . Insertion of dialysis catheter      right chest  . Bascilic vein transposition Left 05/31/2012    Procedure: BASCILIC VEIN TRANSPOSITION;  Surgeon: Conrad [place], MD;  Location: [place];  Service: Vascular;  Laterality: Left;  Left 2nd Stage Basilic Vein Transposition with gortex graft revision using 41mmx10cm graft  . Shoulder open rotator cuff repair Right   . [name] capsule study N/A 08/27/2013    Procedure: [name] CAPSULE STUDY;  Surgeon: Milus Banister, MD;  Location: [place];  Service: Endoscopy;  Laterality: N/A;    Colman Cater MS,RD,CSG,LDN Office: [phone] Pager: [phone]

## 2014-02-16 NOTE — Consult Note (Signed)
Cardiology Consultation Note  Patient ID: [name], MRN: [id number], DOB/AGE: [birth year] 56 y.o. Admit date: 02/15/2014   Date of Consult: 02/16/2014 Primary Physician: Windy Kalata, MD Primary Cardiologist: New  Chief Complaint: chest pain Reason for Consult: chest pain  HPI: Mr. Gervasi is a 58 y/o M with history of polysubstance abuse including cocaine/THC/tobacco, ESRD on HD T/Th/Sat, HTN, anemia of chronic disease (abnormal BMB 2015 being monitored by heme, negative capsule endo 10/2013), hepatitis C, DM, optic neuritis, prior noncompliance who presented to [facility] with chest pain. His only cardiac hx includes reported hx of chronic diastolic CHF with echo 70/2637 showing severe LVH, EF 60-65%.  Yesterday he presented to [facility] dialysis center after missing dialysis for more than 1 week. He was noted to be hypertensive and was managed with HD. BUN >140, Cr 11.30 and K was 6.3. Hgb was 8.9, down from 11-13 earlier this month.  It appears he's had a prior GI workup for his anemia and FOBT is again positive this admission. He also had an abnormal bone marrow biopsy in 06/2013 and appears to just being followed by hematology for now. Per prior GI note in May, he has prior required transfusions and has a significant hsitory of noncompliance with suggested GI testing. He had a reportedly negative capsule endo 10/2013 per heme note.  Prior to the end of his treatment the patient stated that he developed chest pain. Per notes, he was given nitro and hydralazine and CP resolved. To myself he denies ever remembering having chest pain. He denied this to the admitting physician as well. He denies any prior history of CP. No recent dyspnea, nausea, vomiting, syncope, melena, BRBPR, hematemesis or vomiting. He does not exercise. He says he feels tired when he goes up steps but does not have CP/DOE while doing so. UDS + opiates, negative otherwise. He denies current drug or alcohol use but does  smoke cigarettes. CXR showed heart borderline enlarged, no edema or consolidate. Troponins <0.30-<0.30-0.33 thus we are asked to see. He was admitted two other times this month for optic neuritis. He was seen by palliative care for discussion of goals of care since he has not been compliant with dialysis and realizes without it that he would die. They do not think the patient will make any major decisions about his goals in coming days.  Past Medical History  Diagnosis Date  . Hypertension   . Anemia, chronic disease     a. Capsule endoscopy reportedly negative 08/2013. b. seen by heme with concern for minor B cell population on BMB, being observed.  . Chronic diastolic CHF (congestive heart failure)     a. EF 60 - 65% per [place] echo 11/2011. b. Echo 02/2012: severe LVH, focal basal hypertrophy, EF 60-65%, mild Mr.  . Cocaine abuse     mentioned in notes from [place]  . Hepatitis C antibody test positive     was HIV negative, 02/28/12  . Hepatitis B core antibody positive     03/01/10  . Positive QuantiFERON-TB Gold test     11/2011  . Helicobacter pylori gastritis     not defined if this was treated  . Polyp of colon, adenomatous     May 2012.  Dr Trenton Founds in [place]  . Hematochezia   . Head injury, closed, with concussion   . History of blood transfusion   . Type II diabetes mellitus   . Arthritis     "right shoulder" (11/09/2012)  .  Seizure disorder     questionable history of - will need to clarify with PCP  . Headache(784.0)   . ESRD on hemodialysis since 2012    a. Since 2012. ESRD was due to "drugs", primarily used cocaine.  Has 3-5 year hx of HTN, no DM.  Gets HD on TTS schedule at [place]. Originally from [place].   . Hypercalcemia   . Hyperpotassemia   . Tobacco abuse   . Protein-calorie malnutrition, severe   . Optic neuritis, left   . Hypertensive heart disease       Most Recent Cardiac Studies: 2D echo 02/2012 - Left ventricle: Wall thickness was  increased in a pattern of severe LVH. There was focal basal hypertrophy. Systolic function was normal. The estimated ejection fraction was in the range of 60% to 65%. There was an increased relative contribution of atrial contraction to ventricular filling. - Mitral valve: Mild regurgitation. - Left atrium: The atrium was mildly dilated.    Surgical History:  Past Surgical History  Procedure Laterality Date  . Shoulder open rotator cuff repair Right   . Total knee arthroplasty Left   . Bascilic vein transposition  03/07/2012    Procedure: BASCILIC VEIN TRANSPOSITION;  Surgeon: Conrad Butterfield, MD;  Location: [place];  Service: Vascular;  Laterality: Left;  First Stage  . Insertion of dialysis catheter      right chest  . Bascilic vein transposition Left 05/31/2012    Procedure: BASCILIC VEIN TRANSPOSITION;  Surgeon: Conrad [place], MD;  Location: [place];  Service: Vascular;  Laterality: Left;  Left 2nd Stage Basilic Vein Transposition with gortex graft revision using 41mx10cm graft  . Shoulder open rotator cuff repair Right   . [name] capsule study N/A 08/27/2013    Procedure: [name] CAPSULE STUDY;  Surgeon: DMilus Banister MD;  Location[place]  Service: Endoscopy;  Laterality: N/A;     Home Meds: Prior to Admission medications   Medication Sig Start Date End Date Taking? Authorizing Provider  amLODipine (NORVASC) 5 MG tablet Take 5 mg by mouth 4 (four) times a week. Mon, wed, fri, sat   Yes Historical Provider, MD  calcium acetate (PHOSLO) 667 MG capsule Take 1,334 mg by mouth 3 (three) times daily with meals.    Yes Historical Provider, MD  calcium acetate (PHOSLO) 667 MG capsule Take 1 capsule (667 mg total) by mouth as needed (with snacks). 02/05/14  Yes SBlain Pais MD  HYDROcodone-acetaminophen (NORCO/VICODIN) 5-325 MG per tablet Take 1-2 tablets by mouth every 6 (six) hours as needed for moderate pain.   Yes Historical Provider, MD  labetalol (NORMODYNE) 200 MG tablet  Take 200 mg by mouth See admin instructions. Take 200 mg twice daily on sun, mon, wed, fri   Yes Historical Provider, MD  levETIRAcetam (KEPPRA) 1000 MG tablet Take 1,000 mg by mouth daily.    Yes Historical Provider, MD  LORazepam (ATIVAN) 0.5 MG tablet Take 0.5 mg by mouth daily.   Yes Historical Provider, MD  predniSONE (DELTASONE) 20 MG tablet Take 3 tablets (60 mg total) by mouth daily with breakfast. 01/29/14  Yes AFrancesca Oman DO  TUBERCULIN PPD ID Inject 0.1 mLs into the skin daily. For 10 days, to be given on evening  shift   Yes Historical Provider, MD    Inpatient Medications:  . amLODipine  5 mg Oral QHS  . calcium acetate  2,001 mg Oral TID WC  . doxercalciferol      .  doxercalciferol  2 mcg Intravenous Q T,Th,Sa-HD  . heparin  5,000 Units Subcutaneous 3 times per day  . hydrALAZINE  100 mg Oral 3 times per day  . HYDROcodone-acetaminophen      . insulin aspart  0-9 Units Subcutaneous TID WC  . [START ON 02/17/2014] labetalol  200 mg Oral 2 times per day on Sun Mon Wed Fri  . levETIRAcetam  1,000 mg Oral Daily  . LORazepam  0.5 mg Oral Daily  . mirtazapine  15 mg Oral QHS  . multivitamin  1 tablet Oral QHS  . predniSONE  60 mg Oral Q breakfast  . sodium chloride  3 mL Intravenous Q12H      Allergies:  Allergies  Allergen Reactions  . Reglan [Metoclopramide] Other (See Comments)    Tardive dyskinesia in 11/2011 in [place]    History   Social History  . Marital Status: Single    Spouse Name: N/A    Number of Children: 1  . Years of Education: N/A   Occupational History  . Unemployed    Social History Main Topics  . Smoking status: Current Every Day Smoker -- 0.25 packs/day for 25 years    Types: Cigarettes  . Smokeless tobacco: Never Used  . Alcohol Use: No     Comment: 11/09/2012 "been stopped drinking 1-2 yr ago"  . Drug Use: Yes    Special: "Crack" cocaine, Marijuana     Comment: Prior crack cocaine, marijuana use  . Sexual Activity: Yes    Birth  Control/ Protection: None   Other Topics Concern  . Not on file   Social History Narrative  . No narrative on file     Family History  Problem Relation Age of Onset  . Diabetes Mother   . Hypertension Mother   . Stroke Mother   . Kidney failure Mother   . Cancer Father      Review of Systems: All other systems reviewed and are otherwise negative except as noted above.  Labs:  Recent Labs  02/15/14 1832 02/16/14 0048 02/16/14 0442  TROPONINI <0.30 <0.30 0.33*   Lab Results  Component Value Date   WBC 6.5 02/16/2014   HGB 8.8* 02/16/2014   HCT 26.5* 02/16/2014   MCV 85.5 02/16/2014   PLT 167 02/16/2014     Recent Labs Lab 02/16/14 0442  NA 139  K 4.7  CL 99  CO2 29  BUN 34*  CREATININE 4.35*  CALCIUM 8.2*  PROT 6.2  BILITOT 0.4  ALKPHOS 45  ALT 32  AST 22  GLUCOSE 90   Lab Results  Component Value Date   CHOL 113 03/02/2012   HDL 38* 03/02/2012   LDLCALC 62 03/02/2012   TRIG 65 03/02/2012   Radiology/Studies:  Dg Chest 1 View 02/01/2014   CLINICAL DATA:  Headache.  EXAM: CHEST - 1 VIEW  COMPARISON:  01/19/2014  FINDINGS: Heart is upper limits normal in size. Lungs are clear. No effusions or acute bony abnormality.  IMPRESSION: No active disease.   Electronically Signed   By: Rolm Baptise M.D.   On: 02/01/2014 08:55   Dg Chest 2 View 02/15/2014   CLINICAL DATA:  Hypertension; renal failure  EXAM: CHEST  2 VIEW  COMPARISON:  None.  FINDINGS: There is no edema or consolidation. Heart is borderline enlarged with pulmonary vascularity within normal limits. No adenopathy. There is evidence of prior trauma in the right shoulder region with arthropathy in this area.  IMPRESSION: Heart borderline enlarged.  No edema or consolidation.   Electronically Signed   By: Lowella Grip M.D.   On: 02/15/2014 10:53   EKG: NSR LVH with repolarization abnormality   Physical Exam: Blood pressure 118/53, pulse 84, temperature 98.3 F (36.8 C), temperature source  Oral, resp. rate 18, height _0  (1.753 m), weight 116 lb 10 oz (52.9 kg), SpO2 98.00%. General: Well developed, well nourished thin AAM in no acute distress. Head: Normocephalic, atraumatic, sclera non-icteric, no xanthomas, nares are without discharge.  Neck: Negative for carotid bruits. JVD not elevated. Lungs: Clear bilaterally to auscultation without wheezes, rales, or rhonchi. Breathing is unlabored. Heart: RRR with S1 S2. No murmurs, rubs, or gallops appreciated. Abdomen: Soft, non-tender, non-distended with normoactive bowel sounds. No hepatomegaly. No rebound/guarding. No obvious abdominal masses. Msk:  Strength and tone appear normal for age. Extremities: No clubbing or cyanosis. No edema.  Distal pedal pulses are 2+ and equal bilaterally. Neuro: Alert and oriented X 3. No facial asymmetry. No focal deficit. Moves all extremities spontaneously. Psych:  Responds to questions appropriately with a normal affect.   Assessment and Plan:   1. Accelerated hypertension 2. ESRD on HD with hyperkalemia in setting of HD noncompliance 3. Mildly elevated troponin with possible episode of chest pain 4. Hypertensive heart disease 5. Acute on chronic anemia with +FOBT 6. Diabetes mellitus 7. H/o polysubstance abuse with cocaine/THC/tobacco (only using tobacco now) 8. Hepatitis C 9. Seizure disorder  Mr. Day 3rd troponin was mildly elevated following an episode of accelerated hypertension up to 196/81 in the setting of volume overload. This is a nonspecific finding given his ESRD. [name] not currently having any chest pain. He would be a difficult candidate for invasive cardiac evaluation given his acute on chronic anemia with questionable GI losses as well as noncompliance. Symptomatically he is not behaving like an acute coronary syndrome. At this time we favor conservative management with blood pressure control and observation for further symptoms. It doesn't look like he is able to be on  aspirin at this time due to his +FOBT. I think it was an excellent idea involving palliative care to discuss what he can expect if he does decide to discontinue dialysis permanently. We will s/o. Please call with questions.  Signed, Melina Copa PA-C 02/16/2014, 3:41 PM

## 2014-02-16 NOTE — Consult Note (Signed)
Patient [name]      DOB: [birth year]      JQB:[id number]     Consult Note from the Palliative Medicine Team at [facility] Requested by: Shelle Iron NP    PCP: Windy Kalata, MD Reason for Consultation: goals of care surrounding HD     Phone Number:[phone]  Assessment/Recommendations: 58 yo male with PMHx of ESRD, CHF (diastolic), DM, h/o substance abuse who presented from dialysis hypertensive and with chest pain after his treatment session. Difficulty with tolerating dialysis. Palliative consulted for goals.   1.  Code Status: Full  2. Goals of Care:  Difficult situation for [facility].  Dialysis makes him feel bad (nausea, fatigue) but realizes without it he would die. Does not want to stop dialysis and plans to keep going but admits he has thoughts about stopping. [name] not there yet.  Poor social support and feeling depressed as well.  Reports not having any family/friends to help him.  His roommates prior to Rehab were not who he would consider friends.  Can not even identify someone who would be potential health care rep. He has not put much thought into how aggressive medical care should be should he have further decline in future. I do not think he will make any major decisions about his goals in coming days. He has difficult time discussing. We will follow while here. I would request our team be consulted in future admissions so we can continue to slowly have these discussions with him.    3. Symptom Management:  1. Nausea- on PRN zofran. Trouble even keeping pills down. I will add PRN IV compazine. Remeron will help as well as below  2. Insomnia/Appetite Loss- remeron ODT  3. Depression- start remeron. [name] willing to talk with chaplain about his social and spiritual angst.   4. Eye Pain- 2/2 optic neuritis. Continue PRN hydrocodone  4. Psychosocial/Spiritual: Has 2 children in [place] area, but not close with them.  Reports he moved here several  months ago for "trouble" back home.  States no friends, family, or church community.  Has a spiritual faith in god.  Open to chaplain support.    Brief HPI: 58 yo male with PMHx of ESRD, CHF (diastolic), DM, h/o substance abuse who presented from dialysis hypertensive and with chest pain after his treatment session.  He reportedly had missed prior dialysis sessions and has had difficulty with compliance.  This is his 3rd admission this month, one of which was for optic neuritis.  Has been frustrated with hospital care.  Tells me that dialysis makes him feel bad (nausea, fatigue) and that he feels better if he misses it.  Also understands that he would pass away without it.  Has nausea frequently and almost no appetite. Socially isolated and feeling depressed. States "I have no friends".  No family that he is close with.  Doesn't like to do much. Continues to have some eye pain.  Trouble sleeping at night. No other acute complaints.       Full ROS negative unless otherwise mentioned above  PMH:  Past Medical History  Diagnosis Date  . Hypertension   . Anemia, chronic disease   . CHF (congestive heart failure)     diastolic.  EF 60 - 65% per [facility] eco 11/2011  . Cocaine abuse     mentioned in notes from [facility]  . Hepatitis C antibody test positive     was HIV negative, 02/28/12  .  Hepatitis B core antibody positive     03/01/10  . Positive QuantiFERON-TB Gold test     11/2011  . Helicobacter pylori gastritis     not defined if this was treated  . Polyp of colon, adenomatous     May 2012.  Dr Trenton Founds in [place]  . Shortness of breath   . Hematochezia   . Head injury, closed, with concussion   . History of blood transfusion     "last one was 2 days ago" (11/09/2012)  . Type II diabetes mellitus     on oral pills only  . Arthritis     "right shoulder" (11/09/2012)  . Seizure disorder     questionable history of - will need to clarify with PCP  . Headache(784.0)     "q other  day" (11/09/2012)  . ESRD on hemodialysis since 2012    Patient says he started HD in 2012.  ESRD was due to "drugs", primarily used cocaine.  Has 3-5 year hx of HTN, no DM.  Gets HD on TTS schedule at [place].  [name] from [place] and recently moved here.    . Hypercalcemia   . Hyperpotassemia   . Seizures      PSH: Past Surgical History  Procedure Laterality Date  . Shoulder open rotator cuff repair Right   . Total knee arthroplasty Left   . Bascilic vein transposition  03/07/2012    Procedure: BASCILIC VEIN TRANSPOSITION;  Surgeon: Conrad Hawaiian [place], MD;  Location: [facility];  Service: Vascular;  Laterality: Left;  First Stage  . Insertion of dialysis catheter      right chest  . Bascilic vein transposition Left 05/31/2012    Procedure: BASCILIC VEIN TRANSPOSITION;  Surgeon: Conrad Plano, MD;  Location: [facility];  Service: Vascular;  Laterality: Left;  Left 2nd Stage Basilic Vein Transposition with gortex graft revision using 56mmx10cm graft  . Shoulder open rotator cuff repair Right   . [name] capsule study N/A 08/27/2013    Procedure: [name] CAPSULE STUDY;  Surgeon: Milus Banister, MD;  Location: [facility];  Service: Endoscopy;  Laterality: N/A;   I have reviewed the [place] and SH and  If appropriate update it with new information. Allergies  Allergen Reactions  . Reglan [Metoclopramide] Other (See Comments)    Tardive dyskinesia in 11/2011 in [place]   Scheduled Meds: . amLODipine  5 mg Oral QHS  . calcium acetate  2,001 mg Oral TID WC  . doxercalciferol  2 mcg Intravenous Q T,Th,Sa-HD  . heparin  5,000 Units Subcutaneous 3 times per day  . hydrALAZINE  100 mg Oral 3 times per day  . insulin aspart  0-9 Units Subcutaneous TID WC  . [START ON 02/17/2014] labetalol  200 mg Oral 2 times per day on Sun Mon Wed Fri  . levETIRAcetam  1,000 mg Oral Daily  . LORazepam  0.5 mg Oral Daily  . multivitamin  1 tablet Oral QHS  . predniSONE  60 mg Oral Q breakfast  . sodium chloride  3 mL  Intravenous Q12H   Continuous Infusions:  PRN Meds:.acetaminophen, calcium acetate, hydrALAZINE, HYDROcodone-acetaminophen, nitroGLYCERIN, nitroGLYCERIN, ondansetron (ZOFRAN) IV    BP 111/50  Pulse 85  Temp(Src) 98.7 F (37.1 C) (Oral)  Resp 18  Ht 5\' 9"  (1.753 m)  Wt 63.3 kg (139 lb 8.8 oz)  BMI 20.60 kg/m2  SpO2 100%   PPS: 60   Intake/Output Summary (Last 24 hours) at 02/16/14 0948 Last  data filed at 02/16/14 0537  Gross per 24 hour  Intake      3 ml  Output   4872 ml  Net  -4869 ml    Physical Exam:  General: Alert, NAD HEENT:  [place], sclera anicteric, left eye closed frequently Neck: supple Chest:   CTAB CVS: RRR Abdomen: soft, NT, ND Ext: no c/c/e Neuro: moves all ext Skin; Warm, scattered tattoos  Psych: flat affect  Labs: CBC    Component Value Date/Time   WBC 6.5 02/16/2014 0442   WBC 4.0 11/09/2013 0931   RBC 3.10* 02/16/2014 0442   RBC 5.05 11/09/2013 0931   RBC 2.62* 08/22/2013 1223   HGB 8.8* 02/16/2014 0442   HGB 14.1 11/09/2013 0931   HCT 26.5* 02/16/2014 0442   HCT 45.2 11/09/2013 0931   PLT 167 02/16/2014 0442   PLT 246 11/09/2013 0931   MCV 85.5 02/16/2014 0442   MCV 89.4 11/09/2013 0931   MCH 28.4 02/16/2014 0442   MCH 27.9 11/09/2013 0931   MCHC 33.2 02/16/2014 0442   MCHC 31.2* 11/09/2013 0931   RDW 15.3 02/16/2014 0442   RDW 17.5* 11/09/2013 0931   LYMPHSABS 1.1 02/15/2014 0847   LYMPHSABS 1.0 11/09/2013 0931   MONOABS 0.3 02/15/2014 0847   MONOABS 0.5 11/09/2013 0931   EOSABS 0.0 02/15/2014 0847   EOSABS 0.2 11/09/2013 0931   BASOSABS 0.0 02/15/2014 0847   BASOSABS 0.1 11/09/2013 0931    BMET    Component Value Date/Time   NA 139 02/16/2014 0442   NA 141 11/09/2013 0931   K 4.7 02/16/2014 0442   K 4.6 11/09/2013 0931   CL 99 02/16/2014 0442   CO2 29 02/16/2014 0442   CO2 32* 11/09/2013 0931   GLUCOSE 90 02/16/2014 0442   GLUCOSE 77 11/09/2013 0931   BUN 34* 02/16/2014 0442   BUN 28.5* 11/09/2013 0931   CREATININE 4.35* 02/16/2014  0442   CREATININE 6.6* 11/09/2013 0931   CALCIUM 8.2* 02/16/2014 0442   CALCIUM 9.4 11/09/2013 0931   GFRNONAA 14* 02/16/2014 0442   GFRAA 16* 02/16/2014 0442    CMP     Component Value Date/Time   NA 139 02/16/2014 0442   NA 141 11/09/2013 0931   K 4.7 02/16/2014 0442   K 4.6 11/09/2013 0931   CL 99 02/16/2014 0442   CO2 29 02/16/2014 0442   CO2 32* 11/09/2013 0931   GLUCOSE 90 02/16/2014 0442   GLUCOSE 77 11/09/2013 0931   BUN 34* 02/16/2014 0442   BUN 28.5* 11/09/2013 0931   CREATININE 4.35* 02/16/2014 0442   CREATININE 6.6* 11/09/2013 0931   CALCIUM 8.2* 02/16/2014 0442   CALCIUM 9.4 11/09/2013 0931   PROT 6.2 02/16/2014 0442   PROT 9.2* 11/09/2013 0931   ALBUMIN 2.9* 02/16/2014 0442   ALBUMIN 3.5 11/09/2013 0931   AST 22 02/16/2014 0442   AST 33 11/09/2013 0931   ALT 32 02/16/2014 0442   ALT 22 11/09/2013 0931   ALKPHOS 45 02/16/2014 0442   ALKPHOS 74 11/09/2013 0931   BILITOT 0.4 02/16/2014 0442   BILITOT 0.59 11/09/2013 0931   GFRNONAA 14* 02/16/2014 0442   GFRAA 16* 02/16/2014 0442    10/30 CT Head IMPRESSION:  Heart borderline enlarged. No edema or consolidation.   Total Time: 85 minutes  Greater than 50%  of this time was spent counseling and coordinating care related to the above assessment and plan.  Doran Clay D.O. Palliative Medicine Team at [facility]  Pager: [phone]  Team Phone: [phone]

## 2014-02-17 DIAGNOSIS — I119 Hypertensive heart disease without heart failure: Secondary | ICD-10-CM

## 2014-02-17 DIAGNOSIS — I132 Hypertensive heart and chronic kidney disease with heart failure and with stage 5 chronic kidney disease, or end stage renal disease: Secondary | ICD-10-CM | POA: Diagnosis not present

## 2014-02-17 LAB — GLUCOSE, CAPILLARY
GLUCOSE-CAPILLARY: 205 mg/dL — AB (ref 70–99)
Glucose-Capillary: 181 mg/dL — ABNORMAL HIGH (ref 70–99)

## 2014-02-17 MED ORDER — SODIUM CHLORIDE 0.9 % IV BOLUS (SEPSIS)
250.0000 mL | Freq: Once | INTRAVENOUS | Status: AC
  Administered 2014-02-17: 250 mL via INTRAVENOUS

## 2014-02-17 NOTE — Progress Notes (Signed)
Utilization review completed.  

## 2014-02-17 NOTE — Plan of Care (Signed)
Problem: Phase III Progression Outcomes Goal: Hemodynamically stable Outcome: Completed/Met Date Met:  02/17/14 Goal: No anginal pain Outcome: Completed/Met Date Met:  02/17/14 Goal: Tolerating diet Outcome: Completed/Met Date Met:  02/17/14

## 2014-02-17 NOTE — Plan of Care (Signed)
Problem: Discharge Progression Outcomes Goal: No anginal pain Outcome: Completed/Met Date Met:  02/17/14

## 2014-02-17 NOTE — Progress Notes (Signed)
Admit: 02/15/2014 LOS: 2  41M ESRD THS at [facility] via AVF presented with uncontrolled HTN, uremia, dialysis nonadherence  Subjective:  BP improved Pt hungry, wants to go home Saw cardiology, no further cardiac w/u necessary    10/31 0701 - 11/01 0700 In: 3 [I.V.:3] Out: 3518   Filed Weights   02/16/14 0455 02/16/14 1137 02/16/14 1556  Weight: 63.3 kg (139 lb 8.8 oz) 52.9 kg (116 lb 10 oz) 48.1 kg (106 lb 0.7 oz)    Scheduled Meds: . amLODipine  5 mg Oral QHS  . calcium acetate  2,001 mg Oral TID WC  . doxercalciferol  2 mcg Intravenous Q T,Th,Sa-HD  . heparin  5,000 Units Subcutaneous 3 times per day  . hydrALAZINE  100 mg Oral 3 times per day  . insulin aspart  0-9 Units Subcutaneous TID WC  . labetalol  200 mg Oral 2 times per day on Sun Mon Wed Fri  . levETIRAcetam  1,000 mg Oral Daily  . LORazepam  0.5 mg Oral Daily  . mirtazapine  15 mg Oral QHS  . multivitamin  1 tablet Oral QHS  . predniSONE  60 mg Oral Q breakfast  . sodium chloride  3 mL Intravenous Q12H   Continuous Infusions:  PRN Meds:.acetaminophen, calcium acetate, hydrALAZINE, HYDROcodone-acetaminophen, nitroGLYCERIN, nitroGLYCERIN, ondansetron (ZOFRAN) IV, prochlorperazine  Current Labs: reviewed    Physical Exam:  Blood pressure 100/53, pulse 87, temperature 98.2 F (36.8 C), temperature source Oral, resp. rate 20, height 5\' 9"  (1.753 m), weight 48.1 kg (106 lb 0.7 oz), SpO2 97 %. General: No acute distress.  Head: L eye closed/ edema  Lungs: Breathing is unlabored.  Lower extremities:without edema  Neuro: Oriented to person and place.  Psych: calmer  Dialysis Access: L AVF   Assessment/Plan 1. Uncontrolled HTN / Vol 1. Related to medication and dialysis nonadherence 2. Improved with restarting medications, ultrafiltration 3. Out EDW is 57kg, Post was 48kg, should be new EDW 2. Chest pain on dialysis 1. Likely related to hypertension 2. Not present this morning 3. Troponins are largely  reassuring 4. Cardiology evaluated 3. ESRD, nonadherence, Tuesday Thursday [facility] 1. Keep on treatment schedule 2. Goal UF is 4 L, eventually can start targeting weight-based UF goals but limited by nonadherence 4. MBD: Continue outpatient regimen 5. Anemia: Stable. 6. 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

## 2014-02-17 NOTE — Progress Notes (Addendum)
[facility] PROGRESS NOTE  [name] VPX:[id number] DOB: Jun 15, 1955 DOA: 02/15/2014 PCP: Windy Kalata, MD Interim summary: [name] is a 58 y.o. male  SNF resident w/ PMH of HTN anemia, dCHF, polysubstance abuse, Hep C, Hep B, DM, Szrs, ESRD, presenting to the [facility] dialysis center afdter missing dialysis for more than 1 week. Pt given dialysis and noted to be hypertensive. This was managed in dialysis by Dr. Joelyn Oms. Prior to the end of his treatment the pt stated that he developed chest pain. Admitted to medical service for further evaluation.   Assessment/Plan: 1. Chest pain : slightly elevated troponin. Currently pain free. EKG unchagned. Cardiology consulted and no further work planned .   ESRD on HD: Resume HD  as per renal.    Hypertensive urgency: Resolved. And he is hypotensive today. Small bolus ordered. Holding hydralazine tonight.   Diet controlled DM: Stable.   Seizures: Resume keppra  Code Status: fullc ode.  Family Communication: none at bedside Disposition Plan: d/c in am.    Consultants:  CARDIOLOGY  Palliative care  Nephrology.  Procedures:  HD  Antibiotics:  none  HPI/Subjective: No chest pain, slightly nauseated.   Objective: Filed Vitals:   02/17/14 1444  BP: 81/34  Pulse:   Temp:   Resp:     Intake/Output Summary (Last 24 hours) at 02/17/14 1810 Last data filed at 02/17/14 1438  Gross per 24 hour  Intake    480 ml  Output      0 ml  Net    480 ml   Filed Weights   02/16/14 0455 02/16/14 1137 02/16/14 1556  Weight: 63.3 kg (139 lb 8.8 oz) 52.9 kg (116 lb 10 oz) 48.1 kg (106 lb 0.7 oz)    Exam:   General:  Alert afebrile comfortable  Cardiovascular: s1s2  Respiratory: ctab  Abdomen: soft NT ND BS+  Musculoskeletal: PEDAL EDEMA.   Data Reviewed: Basic Metabolic Panel:  Recent Labs Lab 02/15/14 0905 02/16/14 0442  NA 140 139  K 6.3* 4.7  CL 107 99  CO2  --  29  GLUCOSE 137* 90  BUN >140*  34*  CREATININE 11.30* 4.35*  CALCIUM  --  8.2*   Liver Function Tests:  Recent Labs Lab 02/16/14 0442  AST 22  ALT 32  ALKPHOS 45  BILITOT 0.4  PROT 6.2  ALBUMIN 2.9*   No results for input(s): LIPASE, AMYLASE in the last 168 hours. No results for input(s): AMMONIA in the last 168 hours. CBC:  Recent Labs Lab 02/15/14 0847 02/15/14 0905 02/16/14 0442  WBC 7.6  --  6.5  NEUTROABS 6.1  --   --   HGB 8.9* 9.5* 8.8*  HCT 26.5* 28.0* 26.5*  MCV 85.5  --  85.5  PLT 157  --  167   Cardiac Enzymes:  Recent Labs Lab 02/15/14 1832 02/16/14 0048 02/16/14 0442  TROPONINI <0.30 <0.30 0.33*   BNP (last 3 results) No results for input(s): PROBNP in the last 8760 hours. CBG:  Recent Labs Lab 02/16/14 0608 02/16/14 1131 02/16/14 1838 02/16/14 2142 02/17/14 1608  GLUCAP 100* 121* 85 101* 181*    No results found for this or any previous visit (from the past 240 hour(s)).   Studies: No results found.  Scheduled Meds: . amLODipine  5 mg Oral QHS  . calcium acetate  2,001 mg Oral TID WC  . doxercalciferol  2 mcg Intravenous Q T,Th,Sa-HD  . heparin  5,000 Units Subcutaneous 3 times per day  .  hydrALAZINE  100 mg Oral 3 times per day  . insulin aspart  0-9 Units Subcutaneous TID WC  . labetalol  200 mg Oral 2 times per day on Sun Mon Wed Fri  . levETIRAcetam  1,000 mg Oral Daily  . LORazepam  0.5 mg Oral Daily  . mirtazapine  15 mg Oral QHS  . multivitamin  1 tablet Oral QHS  . predniSONE  60 mg Oral Q breakfast  . sodium chloride  3 mL Intravenous Q12H   Continuous Infusions:   Active Problems:   ESRD on hemodialysis   Chest pain   Hypertensive heart disease    Time spent: 25 minutes.     [facility] Pager [phone]. If 7PM-7AM, please contact night-coverage at [email or web address], password [facility] 02/17/2014, 6:10 PM  LOS: 2 days

## 2014-02-18 DIAGNOSIS — F329 Major depressive disorder, single episode, unspecified: Secondary | ICD-10-CM

## 2014-02-18 DIAGNOSIS — I132 Hypertensive heart and chronic kidney disease with heart failure and with stage 5 chronic kidney disease, or end stage renal disease: Secondary | ICD-10-CM | POA: Diagnosis not present

## 2014-02-18 LAB — GLUCOSE, CAPILLARY
GLUCOSE-CAPILLARY: 181 mg/dL — AB (ref 70–99)
GLUCOSE-CAPILLARY: 175 mg/dL — AB (ref 70–99)
GLUCOSE-CAPILLARY: 383 mg/dL — AB (ref 70–99)
Glucose-Capillary: 145 mg/dL — ABNORMAL HIGH (ref 70–99)

## 2014-02-18 MED ORDER — HYDROCODONE-ACETAMINOPHEN 5-325 MG PO TABS: 1.0000 | ORAL_TABLET | Freq: Four times a day (QID) | ORAL | Status: DC | PRN

## 2014-02-18 MED ORDER — MIRTAZAPINE 15 MG PO TBDP: 15.0000 mg | ORAL_TABLET | Freq: Every day | ORAL | Status: DC

## 2014-02-18 MED ORDER — RENA-VITE PO TABS: 1.0000 | ORAL_TABLET | Freq: Every day | ORAL | Status: DC

## 2014-02-18 MED ORDER — LORAZEPAM 0.5 MG PO TABS: 0.5000 mg | ORAL_TABLET | Freq: Every day | ORAL | Status: DC

## 2014-02-18 MED ORDER — PANTOPRAZOLE SODIUM 20 MG PO TBEC: 20.0000 mg | DELAYED_RELEASE_TABLET | Freq: Every day | ORAL | Status: DC

## 2014-02-18 MED ORDER — INSULIN ASPART 100 UNIT/ML ~~LOC~~ SOLN: SUBCUTANEOUS | Status: DC

## 2014-02-18 NOTE — Progress Notes (Signed)
  [facility] Progress Note   Subjective: says he is going home  Filed Vitals:   02/17/14 1444 02/17/14 2130 02/18/14 0401 02/18/14 0634  BP: 81/34 121/48 136/65 100/58  Pulse:  80 78 79  Temp:  97.9 F (36.6 C) 98.3 F (36.8 C) 98 F (36.7 C)  TempSrc:  Oral Oral Oral  Resp:  20 18 18   Height:      Weight:      SpO2:  100% 100% 100%   Exam: Alert, no distress No jvd L eye closed Chest clear bilat RRR no MRG Abd soft, NTND No LE edema L AVF +bruit Neuro is stable, ox 3, nf  HD: TTS AF 4 hours 57kgs 2K/2.25 400/1.5 No Heparin L AVF Hectorol 2 Venofer 50 q week (last dose 10/22) No esa       Assessment: 1 Uncont HTN/ vol - due to medication and dialysis nonadherence, improved. BP normal. Has new lower dry wt of 48 kg. On labetalol and hydralazine. Talked w pt (again) about keeping HD appts.  2 Chest pain - no further w/u per cards 3 ESRD on HD 4 Anemia stable 5 HPTH cont meds 6 Chronic hep C and hep B infections 7 Optic perineuritis 8 Dispo - poss DC home today, stable from renal standpoint  Plan - per primary    Kelly Splinter MD  pager [phone]    cell [phone]  02/18/2014, 10:30 AM     Recent Labs Lab 02/15/14 0905 02/16/14 0442  NA 140 139  K 6.3* 4.7  CL 107 99  CO2  --  29  GLUCOSE 137* 90  BUN >140* 34*  CREATININE 11.30* 4.35*  CALCIUM  --  8.2*    Recent Labs Lab 02/16/14 0442  AST 22  ALT 32  ALKPHOS 45  BILITOT 0.4  PROT 6.2  ALBUMIN 2.9*    Recent Labs Lab 02/15/14 0847 02/15/14 0905 02/16/14 0442  WBC 7.6  --  6.5  NEUTROABS 6.1  --   --   HGB 8.9* 9.5* 8.8*  HCT 26.5* 28.0* 26.5*  MCV 85.5  --  85.5  PLT 157  --  167   . calcium acetate  2,001 mg Oral TID WC  . doxercalciferol  2 mcg Intravenous Q T,Th,Sa-HD  . heparin  5,000 Units Subcutaneous 3 times per day  . hydrALAZINE  100 mg Oral 3 times per day  . insulin aspart  0-9 Units Subcutaneous TID WC  . labetalol  200 mg Oral 2 times  per day on Sun Mon Wed Fri  . levETIRAcetam  1,000 mg Oral Daily  . LORazepam  0.5 mg Oral Daily  . mirtazapine  15 mg Oral QHS  . multivitamin  1 tablet Oral QHS  . predniSONE  60 mg Oral Q breakfast  . sodium chloride  3 mL Intravenous Q12H     acetaminophen, calcium acetate, hydrALAZINE, HYDROcodone-acetaminophen, nitroGLYCERIN, nitroGLYCERIN, ondansetron (ZOFRAN) IV, prochlorperazine

## 2014-02-18 NOTE — Clinical Social Work Note (Addendum)
Clinical Social Work Department BRIEF PSYCHOSOCIAL ASSESSMENT 02/18/2014  Patient:  [name],[name]     Account Number:  [id number]     Admit date:  02/15/2014  Clinical Social Worker:  Dian Queen  Date/Time:  02/18/2014 05:07 PM  Referred by:  Physician  Date Referred:  02/18/2014 Referred for  SNF Placement   Other Referral:   Interview type:  Patient Other interview type:    PSYCHOSOCIAL DATA Living Status:  FACILITY Admitted from facility:  [facility] Level of care:  [place] Primary support name:   Primary support relationship to patient:   Degree of support available:    CURRENT CONCERNS  Other Concerns:    SOCIAL WORK ASSESSMENT / PLAN Patient is a 58 year old male who was living at [facility].  Patient plans to return back to SNF once discharge orders have been given and received.  Patient plans to go back to SNF in order to get his strength back up and eventually return back home.   Assessment/plan status:   Other assessment/ plan:   Information/referral to community resources:    PATIENT'S/FAMILY'S RESPONSE TO PLAN OF CARE: Patient in agreement to plan to return back to [place].    [name] Broom. Kings, MSW, [place] 02/18/2014 5:11 PM

## 2014-02-18 NOTE — Progress Notes (Signed)
   02/18/14 1100  Clinical Encounter Type  Visited With Patient  Visit Type Follow-up;Spiritual support;Social support  Referral From Palliative care team  Spiritual Encounters  Spiritual Needs Emotional  Stress Factors  Patient Stress Factors Other (Comment) (Lack of Support)   Chaplain was referred to patient via spiritual care consult from palliative care team. Patient has a lot of anger. Patient is angry because of his living situation. Patient explained that he will return to a nursing home facility for rehab but after a few weeks will have to return to the house he was living in. This housing situation is what has made the patient angry. Patient explained that he rented a house with 3 other persons several months ago. Patient feels that his roommates do not treat him well and do not provide him support. Patient essentially has to keep to himself in the house and his only social interaction everyday is during his trips to dialysis. Patient said he has no family to rely on. Anger and loneliness became prominent themes during visit. Patient hopes to change his housing situation in the future. Chaplain will continue to provide emotional and spiritual support for patient as needed. Gar Ponto, Chaplain  11:47 AM

## 2014-02-18 NOTE — Progress Notes (Signed)
Patient [name]      DOB: [birth year]      RAQ:[id number]   Palliative Medicine Team at [facility] Progress Note    Subjective: Feeling much better today. pain resolved. Appetite improved. No more nausea.  Mood better.      Filed Vitals:   02/18/14 0634  BP: 100/58  Pulse: 79  Temp: 98 F (36.7 C)  Resp: 18   Physical exam: GEN: alert, NAD HEENT: [place], mmm CV: RRR LUNGS: CTAB Abd; Soft, ND EXT: no edema  CBC    Component Value Date/Time   WBC 6.5 02/16/2014 0442   WBC 4.0 11/09/2013 0931   RBC 3.10* 02/16/2014 0442   RBC 5.05 11/09/2013 0931   RBC 2.62* 08/22/2013 1223   HGB 8.8* 02/16/2014 0442   HGB 14.1 11/09/2013 0931   HCT 26.5* 02/16/2014 0442   HCT 45.2 11/09/2013 0931   PLT 167 02/16/2014 0442   PLT 246 11/09/2013 0931   MCV 85.5 02/16/2014 0442   MCV 89.4 11/09/2013 0931   MCH 28.4 02/16/2014 0442   MCH 27.9 11/09/2013 0931   MCHC 33.2 02/16/2014 0442   MCHC 31.2* 11/09/2013 0931   RDW 15.3 02/16/2014 0442   RDW 17.5* 11/09/2013 0931   LYMPHSABS 1.1 02/15/2014 0847   LYMPHSABS 1.0 11/09/2013 0931   MONOABS 0.3 02/15/2014 0847   MONOABS 0.5 11/09/2013 0931   EOSABS 0.0 02/15/2014 0847   EOSABS 0.2 11/09/2013 0931   BASOSABS 0.0 02/15/2014 0847   BASOSABS 0.1 11/09/2013 0931    CMP     Component Value Date/Time   NA 139 02/16/2014 0442   NA 141 11/09/2013 0931   K 4.7 02/16/2014 0442   K 4.6 11/09/2013 0931   CL 99 02/16/2014 0442   CO2 29 02/16/2014 0442   CO2 32* 11/09/2013 0931   GLUCOSE 90 02/16/2014 0442   GLUCOSE 77 11/09/2013 0931   BUN 34* 02/16/2014 0442   BUN 28.5* 11/09/2013 0931   CREATININE 4.35* 02/16/2014 0442   CREATININE 6.6* 11/09/2013 0931   CALCIUM 8.2* 02/16/2014 0442   CALCIUM 9.4 11/09/2013 0931   PROT 6.2 02/16/2014 0442   PROT 9.2* 11/09/2013 0931   ALBUMIN 2.9* 02/16/2014 0442   ALBUMIN 3.5 11/09/2013 0931   AST 22 02/16/2014 0442   AST 33 11/09/2013 0931   ALT 32 02/16/2014 0442   ALT 22  11/09/2013 0931   ALKPHOS 45 02/16/2014 0442   ALKPHOS 74 11/09/2013 0931   BILITOT 0.4 02/16/2014 0442   BILITOT 0.59 11/09/2013 0931   GFRNONAA 14* 02/16/2014 0442   GFRAA 16* 02/16/2014 0442       Assessment and plan: 58 yo male with PMHx of ESRD, CHF (diastolic), DM, h/o substance abuse who presented from dialysis hypertensive and with chest pain after his treatment session. Difficulty with tolerating dialysis. Palliative consulted for goals.   1. Code Status: Full- not discussed today  2. Goals of Care:  See initial consult.  Loran talks a bit more today. In much better mood. Feels dialysis is going well now.  Clear that he wants to continue.  Talked a little about advance care planning. Needs to think more.   Believes he will name his friend Tommy Rainwater as [place], but not sure yet. Please consult our team in future admissions.    3. Symptom Management:- all much improved. May be more related to dialysis, htn treatment than meds at this point 1. Nausea- resolved. Cont remeron 2. Insomnia/Appetite Loss- improving, remeron 3. Depression-  improved, cont remeron 4. Eye Pain- 2/2 optic neuritis. Continue PRN hydrocodone  4. Psychosocial/Spiritual: Has 2 children in [place] area, but not close with them. Reports he moved here several months ago for "trouble" back home. States no friends, family, or church community. Has a spiritual faith in god. Open to chaplain support.    Doran Clay D.O. Palliative Medicine Team at [facility]  Pager: [phone] Team Phone: [phone]

## 2014-02-18 NOTE — Discharge Summary (Signed)
Physician Discharge Summary  [name] QIO:[id number] DOB: [birth year] DOA: 02/15/2014  PCP: Windy Kalata, MD  Admit date: 02/15/2014 Discharge date: 02/18/2014  Time spent: 30 minutes  Recommendations for Outpatient Follow-up:  Follow up with neuro opthalmologist as recommended Follow up with PCP in 1 to 2 weeks.  Discharge Diagnoses:  Active Problems:   ESRD on hemodialysis   Chest pain   Hypertensive heart disease   Discharge Condition: improved  Diet recommendation: low sodium/ renal diet   Filed Weights   02/16/14 0455 02/16/14 1137 02/16/14 1556  Weight: 63.3 kg (139 lb 8.8 oz) 52.9 kg (116 lb 10 oz) 48.1 kg (106 lb 0.7 oz)    History of present illness:  [name] is a 58 y.o. male  SNF resident w/ PMH of HTN anemia, dCHF, polysubstance abuse, Hep C, Hep B, DM, Szrs, ESRD, presenting to the [facility] dialysis center afdter missing dialysis for more than 1 week. He also reported some chest pain and he was admitted to medical service for further evaluation.   Hospital Course:  1. Chest pain : slightly elevated troponin. Currently pain free. EKG unchagned. Cardiology consulted and no further work planned .  ESRD on HD: Resume HD as per renal.    Hypertensive urgency: Resolved. Changed medications.   Diet controlled DM: Stable.   Seizures: Resume keppra  Peri neuritis; On 60 mg of prednisone and spoke with Dr Posey Pronto of pediatric opthalmology, who recommended continue the same dose of prednisone and she plans to refer him to Dr Hassell Done neuro ophthalmologist at [facility] for further work up.   Procedures:  none  Consultations:  Palliative  Renal    Discharge Exam: Filed Vitals:   02/18/14 1349  BP: 99/54  Pulse: 81  Temp: 98.1 F (36.7 C)  Resp: 16    General: alert afebrile comfortable Cardiovascular: s1s2 Respiratory: ctab  Discharge Instructions You were cared for by a hospitalist during your hospital stay. If you have any questions  about your discharge medications or the care you received while you were in the hospital after you are discharged, you can call the unit and asked to speak with the hospitalist on call if the hospitalist that took care of you is not available. Once you are discharged, your primary care physician will handle any further medical issues. Please note that NO REFILLS for any discharge medications will be authorized once you are discharged, as it is imperative that you return to your primary care physician (or establish a relationship with a primary care physician if you do not have one) for your aftercare needs so that they can reassess your need for medications and monitor your lab values.  Discharge Instructions    Diet - low sodium heart healthy    Complete by:  As directed      Discharge instructions    Complete by:  As directed   Follow up with opthalmology as recommended Follow up with Dr Hassell Done at neuro ophthalmologist as recommended.          Current Discharge Medication List    START taking these medications   Details  mirtazapine (REMERON SOL-TAB) 15 MG disintegrating tablet Take 1 tablet (15 mg total) by mouth at bedtime. Qty: 30 tablet, Refills: 0    multivitamin (RENA-VIT) TABS tablet Take 1 tablet by mouth at bedtime. Qty: 30 tablet, Refills: 0      CONTINUE these medications which have CHANGED   Details  HYDROcodone-acetaminophen (NORCO/VICODIN) 5-325 MG per tablet Take 1-2 tablets  by mouth every 6 (six) hours as needed for moderate pain. Qty: 10 tablet, Refills: 0    LORazepam (ATIVAN) 0.5 MG tablet Take 1 tablet (0.5 mg total) by mouth daily. Qty: 10 tablet, Refills: 0      CONTINUE these medications which have NOT CHANGED   Details  !! calcium acetate (PHOSLO) 667 MG capsule Take 1,334 mg by mouth 3 (three) times daily with meals.     !! calcium acetate (PHOSLO) 667 MG capsule Take 1 capsule (667 mg total) by mouth as needed (with snacks). Qty: 90 capsule, Refills: 0     labetalol (NORMODYNE) 200 MG tablet Take 200 mg by mouth See admin instructions. Take 200 mg twice daily on sun, mon, wed, fri    levETIRAcetam (KEPPRA) 1000 MG tablet Take 1,000 mg by mouth daily.     predniSONE (DELTASONE) 20 MG tablet Take 3 tablets (60 mg total) by mouth daily with breakfast. Qty: 90 tablet, Refills: 0     !! - Potential duplicate medications found. Please discuss with provider.    STOP taking these medications     amLODipine (NORVASC) 5 MG tablet      TUBERCULIN PPD ID        Allergies  Allergen Reactions  . Reglan [Metoclopramide] Other (See Comments)    Tardive dyskinesia in 11/2011 in [place]    Follow up with MATTINGLY,MICHAEL T, MD. Schedule an appointment as soon as possible for a visit in 1 week.   Specialty:  Nephrology   Contact information:   [place] [address] [phone]        The results of significant diagnostics from this hospitalization (including imaging, microbiology, ancillary and laboratory) are listed below for reference.    Significant Diagnostic Studies: Dg Chest 1 View  02/01/2014   CLINICAL DATA:  Headache.  EXAM: CHEST - 1 VIEW  COMPARISON:  01/19/2014  FINDINGS: Heart is upper limits normal in size. Lungs are clear. No effusions or acute bony abnormality.  IMPRESSION: No active disease.   Electronically Signed   By: Rolm Baptise M.D.   On: 02/01/2014 08:55   Dg Chest 2 View  02/15/2014   CLINICAL DATA:  Hypertension; renal failure  EXAM: CHEST  2 VIEW  COMPARISON:  None.  FINDINGS: There is no edema or consolidation. Heart is borderline enlarged with pulmonary vascularity within normal limits. No adenopathy. There is evidence of prior trauma in the right shoulder region with arthropathy in this area.  IMPRESSION: Heart borderline enlarged.  No edema or consolidation.   Electronically Signed   By: Lowella Grip M.D.   On: 02/15/2014 10:53   Ct Head Wo Contrast  02/01/2014    CLINICAL DATA:  Diffuse headache, hypertension, and left eye swelling. Prior stroke with residual left-sided weakness.  EXAM: CT HEAD WITHOUT CONTRAST  CT MAXILLOFACIAL WITHOUT CONTRAST  TECHNIQUE: Multidetector CT imaging of the head and maxillofacial structures were performed using the standard protocol without intravenous contrast. Multiplanar CT image reconstructions of the maxillofacial structures were also generated.  COMPARISON:  Brain MRI 01/21/2014. Head CT 01/19/2014. Maxillofacial CT 01/20/2014.  FINDINGS: CT HEAD FINDINGS  There is no evidence of acute cortical infarct, intracranial hemorrhage, mass, midline shift, or extra-axial fluid collection. Ventricles and sulci are within normal limits for age. Patchy and confluent periventricular and subcortical white matter hypodensities are similar to the prior studies and nonspecific but compatible with moderate chronic small vessel ischemic disease. Mastoid air cells and visualized  paranasal sinuses are clear. Moderate carotid siphon calcification is noted. No skull fracture is seen.  CT MAXILLOFACIAL FINDINGS  The globes appear intact. Mild left proptosis is not excluded. The extraocular muscles and lacrimal glands are unremarkable. No significant inflammatory changes are identified in the orbital fat. No significant preseptal inflammatory change/ soft tissue thickening is identified. There is no orbital mass. No fluid collection is identified. There is trace left maxillary sinus mucosal thickening. There is poor dentition with multiple periapical lucencies, most prominently involving the left maxillary second molar.  IMPRESSION: 1. No evidence of acute intracranial abnormality. 2. Moderate chronic small vessel ischemic disease. 3. No significant inflammatory changes involving the orbits. 4. Periodontal disease.   Electronically Signed   By: Logan Bores   On: 02/01/2014 09:07   [name] Head Wo Contrast  01/21/2014   CLINICAL DATA:  Diabetic with LEFT  proptosis.  EXAM: MRA HEAD WITHOUT CONTRAST  TECHNIQUE: Angiographic images of the Circle of Willis were obtained using MRA technique without intravenous contrast.  COMPARISON:  CT head 01/19/2014. CT maxillofacial 01/20/2014. MR of the head and orbits reported separately.  FINDINGS: The LEFT internal carotid artery is widely patent.  The RIGHT internal carotid artery demonstrates long segment 50% stenosis in its inferior cavernous segment. There is a similar 50% stenosis in the cavernous/supraclinoid junction. Both ICA termini are widely patent.  Basilar artery widely patent with vertebrals both contributing, RIGHT dominant. No proximal stenosis of the RIGHT or LEFT middle cerebral artery. No MCA branch occlusion.  Both posterior cerebral arteries widely patent in their proximal segments. Some mild irregularity in the LEFT P3 PCA segment distal to the ambient cistern.  Moderate irregularity of the RIGHT A1 ACA. The LEFT A1 ACA is robust and supplies both anterior cerebrals as an azygos A2.  IMPRESSION: Non stenotic atheromatous change of the skull base and cavernous RIGHT internal carotid artery, likely manifestation of diabetic type atherosclerotic disease. Similar moderate irregularity intracranially affecting the proximal RIGHT anterior cerebral.  No flow reducing lesion of the carotid or basilar arteries is identified.   Electronically Signed   By: Rolla Flatten M.D.   On: 01/21/2014 15:41   Mr Brain Wo Contrast  01/21/2014   CLINICAL DATA:  Left eye pain and blurred vision for 3 days. Left eye drainage. Left-sided frontal headache. Diabetes and dialysis.  EXAM: MRI HEAD AND ORBITS WITHOUT CONTRAST  TECHNIQUE: Multiplanar, multiecho pulse sequences of the brain and surrounding structures were obtained without intravenous contrast. Multiplanar, multiecho pulse sequences of the orbits and surrounding structures were obtained including fat saturation techniques, without intravenous contrast administration.  :  COMPARISON:  CT head 01/19/2014  FINDINGS: MRI HEAD FINDINGS  Mild atrophy. Moderate chronic ischemic changes throughout the cerebral white matter. 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 Extensive chronic ischemia in the pons. Cerebellum intact.  Negative for acute infarct.  Chronic hemorrhage in the right frontal lobe along the surface of the brain possibly due to prior subarachnoid hemorrhage or trauma. No mass or edema identified.  MRI ORBITS FINDINGS  Mild left-sided proptosis. The globe is normal in shape and size and is symmetric. Extraocular muscles are normal. No edema in the orbital fat.  The optic nerve sheath is mildly dilated on the left. Question papilledema. Optic nerve is normal in signal bilaterally. Lacrimal gland is normal. No mass is present within the orbit. No edema in the eyelid to suggest orbital cellulitis. Negative for abscess. Paranasal sinuses reveal minimal mucosal edema  in the base of left maxillary sinus.  IMPRESSION: Atrophy and moderately severe chronic microvascular ischemia. No acute infarct.  Chronic hemorrhage right frontal lobe, question prior trauma  Mild left-sided proptosis without mass lesion. Mildly distended optic nerve sheath on the left, question papilledema. No underlying mass edema or abscess identified   Electronically Signed   By: Franchot Gallo M.D.   On: 01/21/2014 15:51   Ct Maxillofacial W/cm  01/20/2014   CLINICAL DATA:  New onset of left orbital swelling 4 days ago without reported trauma ; dialysis dependent renal failure ; subsequent encounter  EXAM: CT MAXILLOFACIAL WITH CONTRAST  TECHNIQUE: Multidetector CT imaging of the maxillofacial structures was performed with intravenous contrast. Multiplanar CT image reconstructions were also generated. A small metallic BB was placed on the right temple in order to reliably differentiate right from left.  CONTRAST:  11mL OMNIPAQUE IOHEXOL 300 MG/ML  SOLN intravenous  COMPARISON:  Noncontrast  CT scan of the brain of January 19, 2014  FINDINGS: The globes are intact. There is no significant pre or postseptal edema. The periorbital soft tissues exhibit no evidence of abscess nor significant edema. The male are soft tissues are unremarkable.  The paranasal sinuses exhibit no air-fluid levels. There is a small amount of mucoperiosteal thickening in the left maxillary sinus. There is lucency within the posterior aspect of the left maxilla with small amount of increased density in the adjacent soft tissues along the lateral aspect of the alveolar ridge. A tiny focus of gas is demonstrated on image 42. The posterior-most molar appears to be partially destroyed. The temporomandibular joints exhibit no acute abnormality.  IMPRESSION: 1. There are no findings to suggest significant inflammatory changes of the orbit. The pre and postseptal soft tissues are fairly symmetric with those on the right. 2. There is abnormal soft tissue density along the external surface of the left maxilla posteriorly with destructive changes of a posterior molar. The findings suggest periodontal inflammation without discrete abscess. 3. No significant inflammatory change within the paranasal sinuses is demonstrated.   Electronically Signed   By: David  Martinique   On: 01/20/2014 08:49   Ct Maxillofacial Wo Cm  02/01/2014   CLINICAL DATA:  Diffuse headache, hypertension, and left eye swelling. Prior stroke with residual left-sided weakness.  EXAM: CT HEAD WITHOUT CONTRAST  CT MAXILLOFACIAL WITHOUT CONTRAST  TECHNIQUE: Multidetector CT imaging of the head and maxillofacial structures were performed using the standard protocol without intravenous contrast. Multiplanar CT image reconstructions of the maxillofacial structures were also generated.  COMPARISON:  Brain MRI 01/21/2014. Head CT 01/19/2014. Maxillofacial CT 01/20/2014.  FINDINGS: CT HEAD FINDINGS  There is no evidence of acute cortical infarct, intracranial hemorrhage, mass,  midline shift, or extra-axial fluid collection. Ventricles and sulci are within normal limits for age. Patchy and confluent periventricular and subcortical white matter hypodensities are similar to the prior studies and nonspecific but compatible with moderate chronic small vessel ischemic disease. Mastoid air cells and visualized paranasal sinuses are clear. Moderate carotid siphon calcification is noted. No skull fracture is seen.  CT MAXILLOFACIAL FINDINGS  The globes appear intact. Mild left proptosis is not excluded. The extraocular muscles and lacrimal glands are unremarkable. No significant inflammatory changes are identified in the orbital fat. No significant preseptal inflammatory change/ soft tissue thickening is identified. There is no orbital mass. No fluid collection is identified. There is trace left maxillary sinus mucosal thickening. There is poor dentition with multiple periapical lucencies, most prominently involving the left maxillary second  molar.  IMPRESSION: 1. No evidence of acute intracranial abnormality. 2. Moderate chronic small vessel ischemic disease. 3. No significant inflammatory changes involving the orbits. 4. Periodontal disease.   Electronically Signed   By: Logan Bores   On: 02/01/2014 09:07   Mr Darnelle Catalan Wo Cm  01/21/2014   CLINICAL DATA:  Left eye pain and blurred vision for 3 days. Left eye drainage. Left-sided frontal headache. Diabetes and dialysis.  EXAM: MRI HEAD AND ORBITS WITHOUT CONTRAST  TECHNIQUE: Multiplanar, multiecho pulse sequences of the brain and surrounding structures were obtained without intravenous contrast. Multiplanar, multiecho pulse sequences of the orbits and surrounding structures were obtained including fat saturation techniques, without intravenous contrast administration.  : COMPARISON:  CT head 01/19/2014  FINDINGS: MRI HEAD FINDINGS  Mild atrophy. Moderate chronic ischemic changes throughout the cerebral white matter. Chronic infarct in the  anterior body of the corpus callosum. Extensive chronic ischemia in the pons. Cerebellum intact.  Negative for acute infarct.  Chronic hemorrhage in the right frontal lobe along the surface of the brain possibly due to prior subarachnoid hemorrhage or trauma. No mass or edema identified.  MRI ORBITS FINDINGS  Mild left-sided proptosis. The globe is normal in shape and size and is symmetric. Extraocular muscles are normal. No edema in the orbital fat.  The optic nerve sheath is mildly dilated on the left. Question papilledema. Optic nerve is normal in signal bilaterally. Lacrimal gland is normal. No mass is present within the orbit. No edema in the eyelid to suggest orbital cellulitis. Negative for abscess. Paranasal sinuses reveal minimal mucosal edema in the base of left maxillary sinus.  IMPRESSION: Atrophy and moderately severe chronic microvascular ischemia. No acute infarct.  Chronic hemorrhage right frontal lobe, question prior trauma  Mild left-sided proptosis without mass lesion. Mildly distended optic nerve sheath on the left, question papilledema. No underlying mass edema or abscess identified   Electronically Signed   By: Franchot Gallo M.D.   On: 01/21/2014 15:51    Microbiology: No results found for this or any previous visit (from the past 240 hour(s)).   Labs: Basic Metabolic Panel:  Recent Labs Lab 02/15/14 0905 02/16/14 0442  NA 140 139  K 6.3* 4.7  CL 107 99  CO2  --  29  GLUCOSE 137* 90  BUN >140* 34*  CREATININE 11.30* 4.35*  CALCIUM  --  8.2*   Liver Function Tests:  Recent Labs Lab 02/16/14 0442  AST 22  ALT 32  ALKPHOS 45  BILITOT 0.4  PROT 6.2  ALBUMIN 2.9*   No results for input(s): LIPASE, AMYLASE in the last 168 hours. No results for input(s): AMMONIA in the last 168 hours. CBC:  Recent Labs Lab 02/15/14 0847 02/15/14 0905 02/16/14 0442  WBC 7.6  --  6.5  NEUTROABS 6.1  --   --   HGB 8.9* 9.5* 8.8*  HCT 26.5* 28.0* 26.5*  MCV 85.5  --  85.5   PLT 157  --  167   Cardiac Enzymes:  Recent Labs Lab 02/15/14 1832 02/16/14 0048 02/16/14 0442  TROPONINI <0.30 <0.30 0.33*   BNP: BNP (last 3 results) No results for input(s): PROBNP in the last 8760 hours. CBG:  Recent Labs Lab 02/17/14 1226 02/17/14 1608 02/17/14 2145 02/18/14 0606 02/18/14 1120  GLUCAP 175* 181* 205* 145* 383*       Signed:  Pearl Bents  [facility] 02/18/2014, 2:27 PM

## 2014-02-18 NOTE — Progress Notes (Signed)
Inpatient Diabetes Program Recommendations  AACE/ADA: New Consensus Statement on Inpatient Glycemic Control (2013)  Target Ranges:  Prepandial:   less than 140 mg/dL      Peak postprandial:   less than 180 mg/dL (1-2 hours)      Critically ill patients:  140 - 180 mg/dL   Results for [name], [name] (MRN [id number]) as of 02/18/2014 13:51  Ref. Range 02/17/2014 07:20 02/17/2014 12:26 02/17/2014 16:08 02/17/2014 21:45 02/18/2014 06:06 02/18/2014 11:20  Glucose-Capillary Latest Range: 70-99 mg/dL 181 (H) 175 (H) 181 (H) 205 (H) 145 (H) 383 (H)   Diabetes history: DM2 Outpatient Diabetes medications: No DM meds; diet controlled Current orders for Inpatient glycemic control: Novolog 0-9 units AC  Inpatient Diabetes Program Recommendations Insulin - Meal Coverage: While inpatient and ordered steroids, may want to consider ordering Novolog 3 units TID with meals if pt eats at least 50% of meals.  Thanks, Barnie Alderman, RN, MSN, CCRN Diabetes Coordinator Inpatient Diabetes Program [phone] (Team Pager) [phone] (AP office) [phone] [facility] office)

## 2014-02-18 NOTE — Clinical Social Work Note (Signed)
Patient to be d/c'ed today back to [facility].  Patient agreeable to plans will transport via ems RN to call report. Evette Cristal, MSW, [place]

## 2014-02-28 ENCOUNTER — Encounter (HOSPITAL_COMMUNITY): Payer: Self-pay

## 2014-02-28 ENCOUNTER — Encounter (HOSPITAL_COMMUNITY): Payer: Self-pay | Admitting: Vascular Surgery

## 2014-02-28 ENCOUNTER — Emergency Department (HOSPITAL_COMMUNITY)
Admission: EM | Admit: 2014-02-28 | Discharge: 2014-02-28 | Disposition: A | Discharge status: Home / Self Care | Payer: Medicare Other | Source: Home / Self Care | Attending: Emergency Medicine | Admitting: Emergency Medicine

## 2014-02-28 ENCOUNTER — Emergency Department (HOSPITAL_COMMUNITY)
Admission: EM | Admit: 2014-02-28 | Discharge: 2014-02-28 | Disposition: A | Discharge status: Home / Self Care | Payer: Medicare Other | Attending: Emergency Medicine | Admitting: Emergency Medicine

## 2014-02-28 ENCOUNTER — Emergency Department (HOSPITAL_COMMUNITY): Payer: Medicare Other

## 2014-02-28 DIAGNOSIS — Z79899 Other long term (current) drug therapy: Secondary | ICD-10-CM | POA: Insufficient documentation

## 2014-02-28 DIAGNOSIS — I5032 Chronic diastolic (congestive) heart failure: Secondary | ICD-10-CM

## 2014-02-28 DIAGNOSIS — G40909 Epilepsy, unspecified, not intractable, without status epilepticus: Secondary | ICD-10-CM | POA: Insufficient documentation

## 2014-02-28 DIAGNOSIS — Z8619 Personal history of other infectious and parasitic diseases: Secondary | ICD-10-CM

## 2014-02-28 DIAGNOSIS — R531 Weakness: Secondary | ICD-10-CM | POA: Diagnosis present

## 2014-02-28 DIAGNOSIS — I120 Hypertensive chronic kidney disease with stage 5 chronic kidney disease or end stage renal disease: Secondary | ICD-10-CM | POA: Insufficient documentation

## 2014-02-28 DIAGNOSIS — H538 Other visual disturbances: Secondary | ICD-10-CM | POA: Insufficient documentation

## 2014-02-28 DIAGNOSIS — Z8669 Personal history of other diseases of the nervous system and sense organs: Secondary | ICD-10-CM

## 2014-02-28 DIAGNOSIS — Z86010 Personal history of colonic polyps: Secondary | ICD-10-CM | POA: Insufficient documentation

## 2014-02-28 DIAGNOSIS — R0602 Shortness of breath: Secondary | ICD-10-CM

## 2014-02-28 DIAGNOSIS — E119 Type 2 diabetes mellitus without complications: Secondary | ICD-10-CM | POA: Diagnosis not present

## 2014-02-28 DIAGNOSIS — Z794 Long term (current) use of insulin: Secondary | ICD-10-CM | POA: Diagnosis not present

## 2014-02-28 DIAGNOSIS — F32A Depression, unspecified: Secondary | ICD-10-CM

## 2014-02-28 DIAGNOSIS — H578 Other specified disorders of eye and adnexa: Secondary | ICD-10-CM | POA: Diagnosis not present

## 2014-02-28 DIAGNOSIS — N186 End stage renal disease: Secondary | ICD-10-CM | POA: Diagnosis not present

## 2014-02-28 DIAGNOSIS — I132 Hypertensive heart and chronic kidney disease with heart failure and with stage 5 chronic kidney disease, or end stage renal disease: Secondary | ICD-10-CM | POA: Diagnosis not present

## 2014-02-28 DIAGNOSIS — Z720 Tobacco use: Secondary | ICD-10-CM | POA: Insufficient documentation

## 2014-02-28 DIAGNOSIS — Z862 Personal history of diseases of the blood and blood-forming organs and certain disorders involving the immune mechanism: Secondary | ICD-10-CM | POA: Insufficient documentation

## 2014-02-28 DIAGNOSIS — Z992 Dependence on renal dialysis: Secondary | ICD-10-CM | POA: Insufficient documentation

## 2014-02-28 DIAGNOSIS — Z8719 Personal history of other diseases of the digestive system: Secondary | ICD-10-CM

## 2014-02-28 DIAGNOSIS — F329 Major depressive disorder, single episode, unspecified: Secondary | ICD-10-CM

## 2014-02-28 DIAGNOSIS — Z87828 Personal history of other (healed) physical injury and trauma: Secondary | ICD-10-CM | POA: Insufficient documentation

## 2014-02-28 DIAGNOSIS — M1990 Unspecified osteoarthritis, unspecified site: Secondary | ICD-10-CM | POA: Insufficient documentation

## 2014-02-28 DIAGNOSIS — N19 Unspecified kidney failure: Secondary | ICD-10-CM

## 2014-02-28 DIAGNOSIS — E875 Hyperkalemia: Secondary | ICD-10-CM | POA: Insufficient documentation

## 2014-02-28 LAB — BASIC METABOLIC PANEL
Anion gap: 17 — ABNORMAL HIGH (ref 5–15)
Anion gap: 13 (ref 5–15)
BUN: 60 mg/dL — AB (ref 6–23)
BUN: 17 mg/dL (ref 6–23)
CO2: 28 mEq/L (ref 19–32)
CO2: 34 mEq/L — ABNORMAL HIGH (ref 19–32)
CREATININE: 7.72 mg/dL — AB (ref 0.50–1.35)
Calcium: 7.5 mg/dL — ABNORMAL LOW (ref 8.4–10.5)
Calcium: 8.2 mg/dL — ABNORMAL LOW (ref 8.4–10.5)
Chloride: 98 mEq/L (ref 96–112)
Chloride: 92 mEq/L — ABNORMAL LOW (ref 96–112)
Creatinine, Ser: 3.45 mg/dL — ABNORMAL HIGH (ref 0.50–1.35)
GFR calc Af Amer: 21 mL/min — ABNORMAL LOW (ref >90)
GFR calc non Af Amer: 7 mL/min — ABNORMAL LOW (ref >90)
GFR calc non Af Amer: 18 mL/min — ABNORMAL LOW (ref >90)
GFR, EST AFRICAN AMERICAN: 8 mL/min — AB (ref >90)
Glucose, Bld: 69 mg/dL — ABNORMAL LOW (ref 70–99)
Glucose, Bld: 105 mg/dL — ABNORMAL HIGH (ref 70–99)
Potassium: 4.4 mEq/L (ref 3.7–5.3)
Potassium: 3.7 mEq/L (ref 3.7–5.3)
Sodium: 143 mEq/L (ref 137–147)
Sodium: 139 mEq/L (ref 137–147)

## 2014-02-28 LAB — CBC WITH DIFFERENTIAL/PLATELET
Basophils Absolute: 0 10*3/uL (ref 0.0–0.1)
Basophils Relative: 0 % (ref 0–1)
Eosinophils Absolute: 0.1 10*3/uL (ref 0.0–0.7)
Eosinophils Relative: 1 % (ref 0–5)
HCT: 28.5 % — ABNORMAL LOW (ref 39.0–52.0)
Hemoglobin: 9.4 g/dL — ABNORMAL LOW (ref 13.0–17.0)
Lymphocytes Relative: 12 % (ref 12–46)
Lymphs Abs: 0.7 10*3/uL (ref 0.7–4.0)
MCH: 29.1 pg (ref 26.0–34.0)
MCHC: 33 g/dL (ref 30.0–36.0)
MCV: 88.2 fL (ref 78.0–100.0)
Monocytes Absolute: 0.5 10*3/uL (ref 0.1–1.0)
Monocytes Relative: 8 % (ref 3–12)
Neutro Abs: 4.8 10*3/uL (ref 1.7–7.7)
Neutrophils Relative %: 79 % — ABNORMAL HIGH (ref 43–77)
Platelets: 158 10*3/uL (ref 150–400)
RBC: 3.23 MIL/uL — ABNORMAL LOW (ref 4.22–5.81)
RDW: 15.6 % — ABNORMAL HIGH (ref 11.5–15.5)
WBC: 6 10*3/uL (ref 4.0–10.5)

## 2014-02-28 LAB — CBG MONITORING, ED: Glucose-Capillary: 70 mg/dL (ref 70–99)

## 2014-02-28 LAB — CBC
HCT: 27.6 % — ABNORMAL LOW (ref 39.0–52.0)
Hemoglobin: 9 g/dL — ABNORMAL LOW (ref 13.0–17.0)
MCH: 29.7 pg (ref 26.0–34.0)
MCHC: 32.6 g/dL (ref 30.0–36.0)
MCV: 91.1 fL (ref 78.0–100.0)
Platelets: 170 10*3/uL (ref 150–400)
RBC: 3.03 MIL/uL — ABNORMAL LOW (ref 4.22–5.81)
RDW: 15.9 % — AB (ref 11.5–15.5)
WBC: 6.1 10*3/uL (ref 4.0–10.5)

## 2014-02-28 LAB — TROPONIN I: Troponin I: <0.3 ng/mL (ref <0.30)

## 2014-02-28 MED ORDER — MORPHINE SULFATE 4 MG/ML IJ SOLN
6.0000 mg | Freq: Once | INTRAMUSCULAR | Status: AC
  Administered 2014-02-28: 6 mg via INTRAVENOUS
  Filled 2014-02-28: qty 2

## 2014-02-28 NOTE — ED Notes (Signed)
Pt reports not having being able to get home. Informed pt of PTAR surfaces. Pt sts "I don't know how I'm going to get in my home when I get there. I don't have my keys and no one is there"

## 2014-02-28 NOTE — ED Notes (Signed)
Pt reports to the ED via [facility] from dialysis for eval of generalized weakness, abd pain, and chest soreness. Pt was seen here earlier today for the same. REports he has dialysis Tues, Thurs, and Saturday and was supposed to have an additional dialysis Wens but was unable to go related to transportation issues. Pt was d/c from here today and sent to dialysis. He normaly has 14 L taken off and he was only able to have 3 taken off before he became very weak and had generalized body cramping. Pt also reporting some SOB while he was on dialysis and decreased PO intake x the past several days. Denies any N/V/D. Pt A&Ox4, resp e/u, and skin warm and dry.

## 2014-02-28 NOTE — ED Notes (Signed)
Returned from xray

## 2014-02-28 NOTE — Discharge Instructions (Signed)
Workup today for the shortness of breath and the renal failure without any acute findings. No significant pulmonary edema and no hypoxia.  Discussed with Dr. Mercy Moore. He has arrange dialysis for you today at [facility] at 12:30 in the afternoon.

## 2014-02-28 NOTE — ED Notes (Signed)
Pt requesting cab voucher; Charge RN aware pt does not want to leave

## 2014-02-28 NOTE — BH Assessment (Signed)
Tele Assessment Note   [name] is an 58 y.o. male.  -Clinician called Dr. Wilson Singer at [facility] to see why patient had TTS referral.  Patient came to [facility] for 2nd time today.  [name] a dialysis patient and was seen earlier at the ED and he came back this evening complaining of chest pain.  Patient told Dr. Wilson Singer that he has some SI off & on but no plan.  Dr. Wilson Singer said that he did not think paitent needed commitment.  Patient does admit to some "crazy thought" of wanting to kill himself.  [name] quick to say, "But I would not do anything."  Patient denies current SI and has no plan or intention to kill self.  Patient denies any HI or A/V hallucinations.  Patient does have depression and it is characterized by insomnia, isolating & tearfulness.  Patient lives in a home with other non-relatives but has little interaction with them.  Patient has no previous mental health care.  [name] hoping to get into an low-income apartment of his own.  Patient has a dialysis case worker but she is not available until next Tuesday.  When asked if he could be safe at home patient says yes that he won't harm himself but says "I don't want to leave."  He wants to stay at the hospital tonight.  -Patient care was discussed with Dr. Wilson Singer.  He does not believe that patient needs inpatient care at this time.  Patient will be given outpatient resources to follow up on.  Patient did express understanding of calling and getting an appointment set up with outpatient provider of his choice.  Referral inforamation was sent to [facility]  Axis I: Major Depression, single episode Axis II: Deferred Axis III:  Past Medical History  Diagnosis Date  . Hypertension   . Anemia, chronic disease     a. Capsule endoscopy reportedly negative 08/2013. b. seen by heme with concern for minor B cell population on BMB, being observed.  . Chronic diastolic CHF (congestive heart failure)     a. EF 60 - 65% per [place] echo 11/2011. b. Echo 02/2012:  severe LVH, focal basal hypertrophy, EF 60-65%, mild Mr.  . Cocaine abuse     mentioned in notes from [place]  . Hepatitis C antibody test positive     was HIV negative, 02/28/12  . Hepatitis B core antibody positive     03/01/10  . Positive QuantiFERON-TB Gold test     11/2011  . Helicobacter pylori gastritis     not defined if this was treated  . Polyp of colon, adenomatous     May 2012.  Dr Trenton Founds in [place]  . Hematochezia   . Head injury, closed, with concussion   . History of blood transfusion   . Type II diabetes mellitus   . Arthritis     "right shoulder" (11/09/2012)  . Seizure disorder     questionable history of - will need to clarify with PCP  . Headache(784.0)   . ESRD on hemodialysis since 2012    a. Since 2012. ESRD was due to "drugs", primarily used cocaine.  Has 3-5 year hx of HTN, no DM.  Gets HD on TTS schedule at [facility]. Originally from [place].   . Hypercalcemia   . Hyperpotassemia   . Tobacco abuse   . Protein-calorie malnutrition, severe   . Optic neuritis, left   . Hypertensive heart disease    Axis IV: other psychosocial  or environmental problems and problems with primary support group Axis V: 41-50 serious symptoms  Past Medical History:  Past Medical History  Diagnosis Date  . Hypertension   . Anemia, chronic disease     a. Capsule endoscopy reportedly negative 08/2013. b. seen by heme with concern for minor B cell population on BMB, being observed.  . Chronic diastolic CHF (congestive heart failure)     a. EF 60 - 65% per [place] echo 11/2011. b. Echo 02/2012: severe LVH, focal basal hypertrophy, EF 60-65%, mild Mr.  . Cocaine abuse     mentioned in notes from [place]  . Hepatitis C antibody test positive     was HIV negative, 02/28/12  . Hepatitis B core antibody positive     03/01/10  . Positive QuantiFERON-TB Gold test     11/2011  . Helicobacter pylori gastritis     not defined if this was treated  . Polyp of colon,  adenomatous     May 2012.  Dr Trenton Founds in [place]  . Hematochezia   . Head injury, closed, with concussion   . History of blood transfusion   . Type II diabetes mellitus   . Arthritis     "right shoulder" (11/09/2012)  . Seizure disorder     questionable history of - will need to clarify with PCP  . Headache(784.0)   . ESRD on hemodialysis since 2012    a. Since 2012. ESRD was due to "drugs", primarily used cocaine.  Has 3-5 year hx of HTN, no DM.  Gets HD on TTS schedule at [place]. Originally from [place].   . Hypercalcemia   . Hyperpotassemia   . Tobacco abuse   . Protein-calorie malnutrition, severe   . Optic neuritis, left   . Hypertensive heart disease     Past Surgical History  Procedure Laterality Date  . Shoulder open rotator cuff repair Right   . Total knee arthroplasty Left   . Bascilic vein transposition  03/07/2012    Procedure: BASCILIC VEIN TRANSPOSITION;  Surgeon: Conrad Simpson, MD;  Location: [place];  Service: Vascular;  Laterality: Left;  First Stage  . Insertion of dialysis catheter      right chest  . Bascilic vein transposition Left 05/31/2012    Procedure: BASCILIC VEIN TRANSPOSITION;  Surgeon: Conrad [place], MD;  Location: [place];  Service: Vascular;  Laterality: Left;  Left 2nd Stage Basilic Vein Transposition with gortex graft revision using 17mmx10cm graft  . Shoulder open rotator cuff repair Right   . [name] capsule study N/A 08/27/2013    Procedure: [name] CAPSULE STUDY;  Surgeon: Milus Banister, MD;  Location: [place];  Service: Endoscopy;  Laterality: N/A;    Family History:  Family History  Problem Relation Age of Onset  . Diabetes Mother   . Hypertension Mother   . Stroke Mother   . Kidney failure Mother   . Cancer Father     Social History:  reports that he has been smoking Cigarettes.  He has a 6.25 pack-year smoking history. He has never used smokeless tobacco. He reports that he uses illicit drugs ("Crack" cocaine and  Marijuana). He reports that he does not drink alcohol.  Additional Social History:  Alcohol / Drug Use Pain Medications: See PTA medication list Prescriptions: See PTA medication list Over the Counter: See PTA medication list History of alcohol / drug use?: No history of alcohol / drug abuse  CIWA: CIWA-Ar BP: 157/76 mmHg Pulse Rate:  69 COWS:    PATIENT STRENGTHS: (choose at least two) Capable of independent living Communication skills  Allergies:  Allergies  Allergen Reactions  . Reglan [Metoclopramide] Other (See Comments)    Tardive dyskinesia in 11/2011 in [facility] Medications:  (Not in a hospital admission)  OB/GYN Status:  No LMP for male patient.  General Assessment Data Location of Assessment: [facility] ED Is this a Tele or Face-to-Face Assessment?: Tele Assessment Is this an Initial Assessment or a Re-assessment for this encounter?: Initial Assessment Living Arrangements: Non-relatives/Friends (Has housemates but no real interaction.) Can pt return to current living arrangement?: Yes Admission Status: Voluntary Is patient capable of signing voluntary admission?: Yes Transfer from: [facility] Referral Source: Self/Family/Friend     [place] Living Arrangements: Non-relatives/Friends (Has housemates but no real interaction.) Name of Psychiatrist: N/A Name of Therapist: N/A  Education Status Highest grade of school patient has completed: 10th grade  Risk to self with the past 6 months Suicidal Ideation: Yes-Currently Present (SI thoughts but no plan or intention.) Suicidal Intent: No Is patient at risk for suicide?: No Suicidal Plan?: No-Not Currently/Within Last 6 Months Access to Means: No What has been your use of drugs/alcohol within the last 12 months?: None Previous Attempts/Gestures: No How many times?: 0 Other Self Harm Risks: N/A Triggers for Past Attempts: None known Intentional Self Injurious Behavior: None Family Suicide  History: No Recent stressful life event(s): Recent negative physical changes (Pt says lately having bad days w/ dialysis) Persecutory voices/beliefs?: No Depression: Yes Depression Symptoms: Despondent, Insomnia, Isolating, Loss of interest in usual pleasures, Feeling worthless/self pity, Feeling angry/irritable Substance abuse history and/or treatment for substance abuse?: No Suicide prevention information given to non-admitted patients: Not applicable  Risk to Others within the past 6 months Homicidal Ideation: No Thoughts of Harm to Others: No Current Homicidal Intent: No Current Homicidal Plan: No Access to Homicidal Means: No Identified Victim: No one History of harm to others?: No Assessment of Violence: In distant past Violent Behavior Description: No current thoughts Does patient have access to weapons?: No Criminal Charges Pending?: No Does patient have a court date: No  Psychosis Hallucinations: None noted Delusions: None noted  Mental Status Report Appear/Hygiene: Disheveled, In scrubs Eye Contact: Fair Motor Activity: Freedom of movement, Unremarkable Speech: Logical/coherent, Soft Level of Consciousness: Alert Mood: Anxious, Despair, Helpless, Sad Affect: Blunted, Depressed, Sad Anxiety Level: Minimal Thought Processes: Relevant, Coherent Judgement: Unimpaired Orientation: Person, Place, Time, Situation Obsessive Compulsive Thoughts/Behaviors: None  Cognitive Functioning Concentration: Decreased Memory: Recent Impaired, Remote Intact IQ: Average Insight: Fair Impulse Control: Good Appetite: Poor Weight Loss:  (Has only eaten once in last 3 days.) Weight Gain: 0 Sleep: No Change Total Hours of Sleep: 5 Vegetative Symptoms: Staying in bed  ADLScreening [facility] Assessment Services) Patient's cognitive ability adequate to safely complete daily activities?: Yes Patient able to express need for assistance with ADLs?: Yes Independently performs ADLs?: Yes  (appropriate for developmental age)  Prior Inpatient Therapy Prior Inpatient Therapy: No Prior Therapy Dates: None Prior Therapy Facilty/Provider(s): None Reason for Treatment: NOne  Prior Outpatient Therapy Prior Outpatient Therapy: No Prior Therapy Dates: N/A Prior Therapy Facilty/Provider(s): N/a Reason for Treatment: N/A  ADL Screening (condition at time of admission) Patient's cognitive ability adequate to safely complete daily activities?: Yes Is the patient deaf or have difficulty hearing?: No Does the patient have difficulty seeing, even when wearing glasses/contacts?: No (Pt does not have glasses but needs them.) Does the patient have  difficulty concentrating, remembering, or making decisions?: No Patient able to express need for assistance with ADLs?: Yes Does the patient have difficulty dressing or bathing?: No Independently performs ADLs?: Yes (appropriate for developmental age) Does the patient have difficulty walking or climbing stairs?: Yes Weakness of Legs: Both Weakness of Arms/Hands: Right  Home Assistive Devices/Equipment Home Assistive Devices/Equipment: Cane (specify quad or straight)    Abuse/Neglect Assessment (Assessment to be complete while patient is alone) Physical Abuse: Denies Verbal Abuse: Denies Sexual Abuse: Denies Exploitation of patient/patient's resources: Denies Self-Neglect: Denies     Regulatory affairs officer (For Healthcare) Does patient have an advance directive?: No Would patient like information on creating an advanced directive?: No - patient declined information    Additional Information 1:1 In Past 12 Months?: No CIRT Risk: No Elopement Risk: No Does patient have medical clearance?: Yes     Disposition:  Disposition Initial Assessment Completed for this Encounter: Yes Disposition of Patient: Outpatient treatment Type of outpatient treatment: Adult (Pt given outpatient resources.for follow up.)  Curlene Dolphin  [place] 02/28/2014 11:02 PM

## 2014-02-28 NOTE — Discharge Instructions (Signed)

## 2014-02-28 NOTE — ED Provider Notes (Signed)
CSN: [id number]     Arrival date & time 02/28/14  3557 History   First MD Initiated Contact with Patient 02/28/14 [phone]     Chief Complaint  Patient presents with  . Weakness     (Consider location/radiation/quality/duration/timing/severity/associated sxs/prior Treatment) Patient is a 58 y.o. male presenting with weakness. The history is provided by the patient.  Weakness Associated symptoms include shortness of breath. Pertinent negatives include no chest pain and no abdominal pain.  patient here with complaint of shortness of breath feeling like his get too much fluid on board. Patient states he is post to be dialyzed today. Missed his ride. Patient only get dialyzed [facility]. Followed by [facility] [place] [facility]. Patient has a history of being noncompliant.  Past Medical History  Diagnosis Date  . Hypertension   . Anemia, chronic disease     a. Capsule endoscopy reportedly negative 08/2013. b. seen by heme with concern for minor B cell population on BMB, being observed.  . Chronic diastolic CHF (congestive heart failure)     a. EF 60 - 65% per [place] echo 11/2011. b. Echo 02/2012: severe LVH, focal basal hypertrophy, EF 60-65%, mild Mr.  . Cocaine abuse     mentioned in notes from [place]  . Hepatitis C antibody test positive     was HIV negative, 02/28/12  . Hepatitis B core antibody positive     03/01/10  . Positive QuantiFERON-TB Gold test     11/2011  . Helicobacter pylori gastritis     not defined if this was treated  . Polyp of colon, adenomatous     May 2012.  Dr Trenton Founds in [place]  . Hematochezia   . Head injury, closed, with concussion   . History of blood transfusion   . Type II diabetes mellitus   . Arthritis     "right shoulder" (11/09/2012)  . Seizure disorder     questionable history of - will need to clarify with PCP  . Headache(784.0)   . ESRD on hemodialysis since 2012    a. Since 2012. ESRD was due to "drugs", primarily used cocaine.  Has 3-5  year hx of HTN, no DM.  Gets HD on TTS schedule at [place]. Originally from [place].   . Hypercalcemia   . Hyperpotassemia   . Tobacco abuse   . Protein-calorie malnutrition, severe   . Optic neuritis, left   . Hypertensive heart disease    Past Surgical History  Procedure Laterality Date  . Shoulder open rotator cuff repair Right   . Total knee arthroplasty Left   . Bascilic vein transposition  03/07/2012    Procedure: BASCILIC VEIN TRANSPOSITION;  Surgeon: Conrad [place], MD;  Location: [place];  Service: Vascular;  Laterality: Left;  First Stage  . Insertion of dialysis catheter      right chest  . Bascilic vein transposition Left 05/31/2012    Procedure: BASCILIC VEIN TRANSPOSITION;  Surgeon: Conrad , MD;  Location: [place];  Service: Vascular;  Laterality: Left;  Left 2nd Stage Basilic Vein Transposition with gortex graft revision using 3mmx10cm graft  . Shoulder open rotator cuff repair Right   . [name] capsule study N/A 08/27/2013    Procedure: [name] CAPSULE STUDY;  Surgeon: Milus Banister, MD;  Location: [place];  Service: Endoscopy;  Laterality: N/A;   Family History  Problem Relation Age of Onset  . Diabetes Mother   . Hypertension Mother   . Stroke Mother   .  Kidney failure Mother   . Cancer Father    History  Substance Use Topics  . Smoking status: Current Every Day Smoker -- 0.25 packs/day for 25 years    Types: Cigarettes  . Smokeless tobacco: Never Used  . Alcohol Use: No     Comment: 11/09/2012 "been stopped drinking 1-2 yr ago"    Review of Systems  Constitutional: Negative for fever.  HENT: Negative for congestion.   Eyes: Positive for discharge.  Respiratory: Positive for shortness of breath.   Cardiovascular: Negative for chest pain.  Gastrointestinal: Negative for nausea, vomiting and abdominal pain.  Genitourinary: Negative for dysuria.  Musculoskeletal: Negative for back pain.  Skin: Negative for rash.  Neurological: Positive for  weakness.  Hematological: Bruises/bleeds easily.  Psychiatric/Behavioral: Negative for confusion.      Allergies  Reglan  Home Medications   Prior to Admission medications   Medication Sig Start Date End Date Taking? Authorizing Provider  calcium acetate (PHOSLO) 667 MG capsule Take 1,334 mg by mouth 3 (three) times daily with meals.     Historical Provider, MD  calcium acetate (PHOSLO) 667 MG capsule Take 1 capsule (667 mg total) by mouth as needed (with snacks). 02/05/14   Blain Pais, MD  HYDROcodone-acetaminophen (NORCO/VICODIN) 5-325 MG per tablet Take 1-2 tablets by mouth every 6 (six) hours as needed for moderate pain. 02/18/14   Hosie Poisson, MD  insulin aspart (NOVOLOG) 100 UNIT/ML injection CBG 70 - 120: 0 units CBG 121 - 150: 1 unit CBG 151 - 200: 2 units CBG 201 - 250: 3 units CBG 251 - 300: 5 units CBG 301 - 350: 7 units CBG 351 - 400: 9 units 02/18/14   Hosie Poisson, MD  labetalol (NORMODYNE) 200 MG tablet Take 200 mg by mouth See admin instructions. Take 200 mg twice daily on sun, mon, wed, fri    Historical Provider, MD  levETIRAcetam (KEPPRA) 1000 MG tablet Take 1,000 mg by mouth daily.     Historical Provider, MD  LORazepam (ATIVAN) 0.5 MG tablet Take 1 tablet (0.5 mg total) by mouth daily. 02/18/14   Hosie Poisson, MD  mirtazapine (REMERON SOL-TAB) 15 MG disintegrating tablet Take 1 tablet (15 mg total) by mouth at bedtime. 02/18/14   Hosie Poisson, MD  multivitamin (RENA-VIT) TABS tablet Take 1 tablet by mouth at bedtime. 02/18/14   Hosie Poisson, MD  pantoprazole (PROTONIX) 20 MG tablet Take 1 tablet (20 mg total) by mouth daily. 02/18/14   Hosie Poisson, MD  predniSONE (DELTASONE) 20 MG tablet Take 3 tablets (60 mg total) by mouth daily with breakfast. 01/29/14   Francesca Oman, DO   BP 217/82 mmHg  Pulse 82  Temp(Src) 98.2 F (36.8 C) (Oral)  Resp 18  Ht 5\' 9"  (1.753 m)  Wt 140 lb (63.504 kg)  BMI 20.67 kg/m2  SpO2 97% Physical Exam  Constitutional: [name]  oriented to person, place, and time. He appears well-developed and well-nourished. No distress.  HENT:  Head: Normocephalic and atraumatic.  Mouth/Throat: Oropharynx is clear and moist.  Eyes: Conjunctivae and EOM are normal. Pupils are equal, round, and reactive to light. Left eye exhibits discharge.  Some swelling in the blurring of the left eye that is chronic.  Neck: Normal range of motion.  Cardiovascular: Normal rate and normal heart sounds.   No murmur heard. Pulmonary/Chest: Effort normal and breath sounds normal.  Abdominal: Soft. Bowel sounds are normal. There is no tenderness.  Musculoskeletal: Normal range of motion. He exhibits  no edema.  Neurological: [name] alert and oriented to person, place, and time. No cranial nerve deficit. He exhibits normal muscle tone. Coordination normal.  Skin: Skin is warm. No rash noted.  Nursing note and vitals reviewed.   ED Course  Procedures (including critical care time) Labs Review Labs Reviewed  CBC - Abnormal; Notable for the following:    RBC 3.03 (*)    Hemoglobin 9.0 (*)    HCT 27.6 (*)    RDW 15.9 (*)    All other components within normal limits  BASIC METABOLIC PANEL - Abnormal; Notable for the following:    Glucose, Bld 69 (*)    BUN 60 (*)    Creatinine, Ser 7.72 (*)    Calcium 7.5 (*)    GFR calc non Af Amer 7 (*)    GFR calc Af Amer 8 (*)    Anion gap 17 (*)    All other components within normal limits  CBG MONITORING, ED    Imaging Review Dg Chest 2 View  02/28/2014   CLINICAL DATA:  Shortness of breath  EXAM: CHEST  2 VIEW  COMPARISON:  02/15/2014  FINDINGS: Unchanged borderline cardiomegaly. Negative aortic and hilar contours. There is no edema, consolidation, effusion, or pneumothorax. Right glenohumeral osteoarthritis, possibly from remote dislocation given evidence of anterior glenoid repair. Advanced disc narrowing and endplate sclerosis at C37-S2, also noted on abdominal CT 08/25/2013.  IMPRESSION: No active  cardiopulmonary disease.   Electronically Signed   By: Jorje Guild M.D.   On: 02/28/2014 08:47     EKG Interpretation   Date/Time:  Thursday February 28 2014 07:45:04 EST Ventricular Rate:  83 PR Interval:  154 QRS Duration: 91 QT Interval:  425 QTC Calculation: 499 R Axis:   14 Text Interpretation:  Sinus rhythm Probable left atrial enlargement LVH  with secondary repolarization abnormality Anterior Q waves, possibly due  to LVH No significant change since last tracing Confirmed by Nevada Kirchner   MD, [place] ([id number]) on 02/28/2014 7:57:09 AM      MDM   Final diagnoses:  SOB (shortness of breath)  Renal failure    Patient is a dialysis patient. Was according to nephrology scheduled for a follow-up dialysis yesterday and no normally he is to see Thursdays and Saturdays. Patient missed yesterday dialysis. Patient thought he missed today's. But was not scheduled. Patient without hypoxia patient without any evidence of pulmonary edema or fluid overload. Patient's potassium was 4.4. Discuss with nephrology. They have arranged dialysis for him today at 12:30 at [facility]. Arranged by Dr. Mercy Moore.  Patient's hypertension is long-standing probably due to noncompliance. No hypertensive emergency.    Fredia Sorrow, MD 02/28/14 [phone]

## 2014-02-28 NOTE — ED Notes (Signed)
TTS in process 

## 2014-02-28 NOTE — ED Provider Notes (Signed)
CSN: [id number]     Arrival date & time 02/28/14  1800 History   First MD Initiated Contact with Patient 02/28/14 1805     Chief Complaint  Patient presents with  . Weakness  . Abdominal Pain     (Consider location/radiation/quality/duration/timing/severity/associated sxs/prior Treatment) HPI   58 year old male with chest soreness, abdominal pain and generalized weakness. Patient was just seen in the emergency room earlier today. Discharge to receive dialysis. [name] actually coming back to the emergency room from dialysis. His pain complaints are vague. He cannot give an exact onset. He seems pretty not concerned about them/disinterested in discussing them further. He seems quite depressed. He does endorse this when specifically asked. Essentially has no friends or family. He does live in a house with some other people but really doesn't have much contact with them though. He says his only interaction he actually has with people is when he gets transported to and from dialysis.  Past Medical History  Diagnosis Date  . Hypertension   . Anemia, chronic disease     a. Capsule endoscopy reportedly negative 08/2013. b. seen by heme with concern for minor B cell population on BMB, being observed.  . Chronic diastolic CHF (congestive heart failure)     a. EF 60 - 65% per [place] echo 11/2011. b. Echo 02/2012: severe LVH, focal basal hypertrophy, EF 60-65%, mild Mr.  . Cocaine abuse     mentioned in notes from [facility]  . Hepatitis C antibody test positive     was HIV negative, 02/28/12  . Hepatitis B core antibody positive     03/01/10  . Positive QuantiFERON-TB Gold test     11/2011  . Helicobacter pylori gastritis     not defined if this was treated  . Polyp of colon, adenomatous     May 2012.  Dr Trenton Founds in [place]  . Hematochezia   . Head injury, closed, with concussion   . History of blood transfusion   . Type II diabetes mellitus   . Arthritis     "right shoulder"  (11/09/2012)  . Seizure disorder     questionable history of - will need to clarify with PCP  . Headache(784.0)   . ESRD on hemodialysis since 2012    a. Since 2012. ESRD was due to "drugs", primarily used cocaine.  Has 3-5 year hx of HTN, no DM.  Gets HD on TTS schedule at [place]. Originally from [place].   . Hypercalcemia   . Hyperpotassemia   . Tobacco abuse   . Protein-calorie malnutrition, severe   . Optic neuritis, left   . Hypertensive heart disease    Past Surgical History  Procedure Laterality Date  . Shoulder open rotator cuff repair Right   . Total knee arthroplasty Left   . Bascilic vein transposition  03/07/2012    Procedure: BASCILIC VEIN TRANSPOSITION;  Surgeon: Conrad McDonough, MD;  Location: [facility];  Service: Vascular;  Laterality: Left;  First Stage  . Insertion of dialysis catheter      right chest  . Bascilic vein transposition Left 05/31/2012    Procedure: BASCILIC VEIN TRANSPOSITION;  Surgeon: Conrad Denison, MD;  Location: [facility];  Service: Vascular;  Laterality: Left;  Left 2nd Stage Basilic Vein Transposition with gortex graft revision using 49mmx10cm graft  . Shoulder open rotator cuff repair Right   . [name] capsule study N/A 08/27/2013    Procedure: [name] CAPSULE STUDY;  Surgeon: Milus Banister, MD;  Location: [facility];  Service: Endoscopy;  Laterality: N/A;   Family History  Problem Relation Age of Onset  . Diabetes Mother   . Hypertension Mother   . Stroke Mother   . Kidney failure Mother   . Cancer Father    History  Substance Use Topics  . Smoking status: Current Every Day Smoker -- 0.25 packs/day for 25 years    Types: Cigarettes  . Smokeless tobacco: Never Used  . Alcohol Use: No     Comment: 11/09/2012 "been stopped drinking 1-2 yr ago"    Review of Systems  All systems reviewed and negative, other than as noted in HPI.   Allergies  Reglan  Home Medications   Prior to Admission medications   Medication Sig Start Date End  Date Taking? Authorizing Provider  calcium acetate (PHOSLO) 667 MG capsule Take 2,001 mg by mouth 3 (three) times daily with meals.    Yes Historical Provider, MD  hydrALAZINE (APRESOLINE) 100 MG tablet Take 100 mg by mouth 2 (two) times daily. DON'T TAKE ON DIALYSIS DAYS   Yes Historical Provider, MD  levETIRAcetam (KEPPRA) 1000 MG tablet Take 1,000 mg by mouth 2 (two) times daily.    Yes Historical Provider, MD   BP 157/76 mmHg  Pulse 82  Temp(Src) 97.7 F (36.5 C) (Oral)  Resp 13  SpO2 100% Physical Exam  Constitutional: He appears well-developed and well-nourished. No distress.  Laying in bed. No acute distress.  HENT:  Head: Normocephalic and atraumatic.  Eyes: Conjunctivae are normal. Right eye exhibits no discharge. Left eye exhibits no discharge.  Neck: Neck supple.  Cardiovascular: Normal rate, regular rhythm and normal heart sounds.  Exam reveals no gallop and no friction rub.   No murmur heard. Pulmonary/Chest: Effort normal and breath sounds normal. No respiratory distress.  Abdominal: Soft. He exhibits no distension. There is no tenderness.  Musculoskeletal: He exhibits no edema or tenderness.  Neurological: [name] alert.  Skin: Skin is warm and dry. [name] not diaphoretic.  Psychiatric: He has a normal mood and affect. His behavior is normal. Thought content normal.  Nursing note and vitals reviewed.   ED Course  Procedures (including critical care time) Labs Review Labs Reviewed  CBC WITH DIFFERENTIAL - Abnormal; Notable for the following:    RBC 3.23 (*)    Hemoglobin 9.4 (*)    HCT 28.5 (*)    RDW 15.6 (*)    Neutrophils Relative % 79 (*)    All other components within normal limits  BASIC METABOLIC PANEL - Abnormal; Notable for the following:    Chloride 92 (*)    CO2 34 (*)    Glucose, Bld 105 (*)    Creatinine, Ser 3.45 (*)    Calcium 8.2 (*)    GFR calc non Af Amer 18 (*)    GFR calc Af Amer 21 (*)    All other components within normal limits   TROPONIN I    Imaging Review Dg Chest 2 View  02/28/2014   CLINICAL DATA:  Shortness of breath with chest soreness after dialysis today.  EXAM: CHEST  2 VIEW  COMPARISON:  02/28/2014.  FINDINGS: Trachea is midline. Heart size normal. Lungs are clear. No pleural fluid.  IMPRESSION: No acute findings.   Electronically Signed   By: Lorin Picket M.D.   On: 02/28/2014 19:29   Dg Chest 2 View  02/28/2014   CLINICAL DATA:  Shortness of breath  EXAM: CHEST  2 VIEW  COMPARISON:  02/15/2014  FINDINGS: Unchanged borderline cardiomegaly. Negative aortic and hilar contours. There is no edema, consolidation, effusion, or pneumothorax. Right glenohumeral osteoarthritis, possibly from remote dislocation given evidence of anterior glenoid repair. Advanced disc narrowing and endplate sclerosis at T88-E2, also noted on abdominal CT 08/25/2013.  IMPRESSION: No active cardiopulmonary disease.   Electronically Signed   By: Jorje Guild M.D.   On: 02/28/2014 08:47     EKG Interpretation   Date/Time:  Thursday February 28 2014 18:20:37 EST Ventricular Rate:  83 PR Interval:  149 QRS Duration: 102 QT Interval:  429 QTC Calculation: 504 R Axis:   47 Text Interpretation:  Sinus rhythm Left atrial enlargement LVH with  secondary repolarization abnormality Anterior ST elevation, probably due  to LVH Prolonged QT interval since last tracing no significant change  Confirmed by Wilson Singer  MD, Carder Yin ([id number]) on 02/28/2014 7:35:26 PM      MDM   Final diagnoses:  SOB (shortness of breath)  ESRD (end stage renal disease)  Depression    58yM with numerous complaints (CP, fatigue, SOB, etc.). I think essentially he is severely depressed though. With regards to his somatic complaints, I have a very low suspicion for serious condition. Just seen in ED earlier today for the same. Had dialysis but says dyspnea is no different. He looks well. No increased WOB. o2 sats normal on RA. CXR fine. EKG w/o acute change.    Pt crying at times. Feels hopeless. Estranged from family in [place], [place]. Has four children. Has no contact with them or their mother. Lives with other people in a house but "they do there thing and I do mine." Only interaction he has with others is medical personel at dialysis or when comes to ED. Has no one to talk to. Somewhat evasive when asked about SI. "Yeah. I have all kinds of crazy thoughts running through my head." Will not provide specifics though. Says he needs someone to talk to. Agreeable to talk with TTS.    Virgel Manifold, MD 03/07/14 [phone]

## 2014-02-28 NOTE — ED Notes (Signed)
Pt here for weakness, onset yesterday, pt discharged from rehab for eye yesterday, pt did receive dialysis on Tuesday but per staff all fluid was not taken off of him. Pt is not currently taking htn meds.

## 2014-02-28 NOTE — ED Notes (Signed)
Pt sleeping, oxygen saturation noted to be 88%, pt aroused and oxygen saturation increased to 92%. Pt placed on 2L nasal cannula and oxygen sats 96-98%.

## 2014-02-28 NOTE — ED Notes (Signed)
Informed pt of discharge to waiting room. Pt unwilling to wait in the lobby for ride home. sts "I'm not waiting in the waiting room, just get me ride."

## 2014-04-05 ENCOUNTER — Encounter (HOSPITAL_COMMUNITY): Payer: Self-pay | Admitting: *Deleted

## 2014-04-05 ENCOUNTER — Inpatient Hospital Stay (HOSPITAL_COMMUNITY)
Admission: EM | Admit: 2014-04-05 | Discharge: 2014-04-07 | DRG: 811 | Disposition: A | Discharge status: Home / Self Care | Payer: Medicare Other | Attending: Internal Medicine | Admitting: Internal Medicine

## 2014-04-05 ENCOUNTER — Emergency Department (HOSPITAL_COMMUNITY): Payer: Medicare Other

## 2014-04-05 DIAGNOSIS — I120 Hypertensive chronic kidney disease with stage 5 chronic kidney disease or end stage renal disease: Secondary | ICD-10-CM | POA: Diagnosis present

## 2014-04-05 DIAGNOSIS — Z833 Family history of diabetes mellitus: Secondary | ICD-10-CM

## 2014-04-05 DIAGNOSIS — D649 Anemia, unspecified: Principal | ICD-10-CM | POA: Diagnosis present

## 2014-04-05 DIAGNOSIS — D500 Iron deficiency anemia secondary to blood loss (chronic): Secondary | ICD-10-CM | POA: Diagnosis present

## 2014-04-05 DIAGNOSIS — K922 Gastrointestinal hemorrhage, unspecified: Secondary | ICD-10-CM | POA: Diagnosis present

## 2014-04-05 DIAGNOSIS — K921 Melena: Secondary | ICD-10-CM | POA: Insufficient documentation

## 2014-04-05 DIAGNOSIS — E119 Type 2 diabetes mellitus without complications: Secondary | ICD-10-CM | POA: Diagnosis present

## 2014-04-05 DIAGNOSIS — M1990 Unspecified osteoarthritis, unspecified site: Secondary | ICD-10-CM | POA: Diagnosis present

## 2014-04-05 DIAGNOSIS — Z590 Homelessness: Secondary | ICD-10-CM

## 2014-04-05 DIAGNOSIS — R0602 Shortness of breath: Secondary | ICD-10-CM | POA: Diagnosis not present

## 2014-04-05 DIAGNOSIS — G40909 Epilepsy, unspecified, not intractable, without status epilepticus: Secondary | ICD-10-CM

## 2014-04-05 DIAGNOSIS — Z8249 Family history of ischemic heart disease and other diseases of the circulatory system: Secondary | ICD-10-CM

## 2014-04-05 DIAGNOSIS — Z992 Dependence on renal dialysis: Secondary | ICD-10-CM

## 2014-04-05 DIAGNOSIS — I951 Orthostatic hypotension: Secondary | ICD-10-CM | POA: Diagnosis present

## 2014-04-05 DIAGNOSIS — E875 Hyperkalemia: Secondary | ICD-10-CM | POA: Diagnosis present

## 2014-04-05 DIAGNOSIS — N2581 Secondary hyperparathyroidism of renal origin: Secondary | ICD-10-CM | POA: Diagnosis present

## 2014-04-05 DIAGNOSIS — Z720 Tobacco use: Secondary | ICD-10-CM

## 2014-04-05 DIAGNOSIS — D479 Neoplasm of uncertain behavior of lymphoid, hematopoietic and related tissue, unspecified: Secondary | ICD-10-CM | POA: Diagnosis present

## 2014-04-05 DIAGNOSIS — R195 Other fecal abnormalities: Secondary | ICD-10-CM

## 2014-04-05 DIAGNOSIS — Z823 Family history of stroke: Secondary | ICD-10-CM

## 2014-04-05 DIAGNOSIS — N186 End stage renal disease: Secondary | ICD-10-CM | POA: Insufficient documentation

## 2014-04-05 DIAGNOSIS — F1290 Cannabis use, unspecified, uncomplicated: Secondary | ICD-10-CM | POA: Diagnosis present

## 2014-04-05 DIAGNOSIS — Z96652 Presence of left artificial knee joint: Secondary | ICD-10-CM | POA: Diagnosis present

## 2014-04-05 DIAGNOSIS — Z888 Allergy status to other drugs, medicaments and biological substances status: Secondary | ICD-10-CM

## 2014-04-05 DIAGNOSIS — I10 Essential (primary) hypertension: Secondary | ICD-10-CM | POA: Diagnosis present

## 2014-04-05 DIAGNOSIS — I5032 Chronic diastolic (congestive) heart failure: Secondary | ICD-10-CM | POA: Diagnosis present

## 2014-04-05 DIAGNOSIS — F1410 Cocaine abuse, uncomplicated: Secondary | ICD-10-CM | POA: Diagnosis present

## 2014-04-05 DIAGNOSIS — Z9115 Patient's noncompliance with renal dialysis: Secondary | ICD-10-CM | POA: Diagnosis present

## 2014-04-05 LAB — COMPREHENSIVE METABOLIC PANEL
ALBUMIN: 2.7 g/dL — AB (ref 3.5–5.2)
ALT: 15 U/L (ref 0–53)
AST: 18 U/L (ref 0–37)
Alkaline Phosphatase: 72 U/L (ref 39–117)
Anion gap: 20 — ABNORMAL HIGH (ref 5–15)
BUN: 69 mg/dL — ABNORMAL HIGH (ref 6–23)
CALCIUM: 7.7 mg/dL — AB (ref 8.4–10.5)
CO2: 22 mEq/L (ref 19–32)
Chloride: 98 mEq/L (ref 96–112)
Creatinine, Ser: 8.08 mg/dL — ABNORMAL HIGH (ref 0.50–1.35)
GFR calc Af Amer: 8 mL/min — ABNORMAL LOW (ref >90)
GFR calc non Af Amer: 7 mL/min — ABNORMAL LOW (ref >90)
Glucose, Bld: 167 mg/dL — ABNORMAL HIGH (ref 70–99)
Potassium: 5.4 mEq/L — ABNORMAL HIGH (ref 3.7–5.3)
SODIUM: 140 meq/L (ref 137–147)
TOTAL PROTEIN: 6.3 g/dL (ref 6.0–8.3)
Total Bilirubin: <0.2 mg/dL — ABNORMAL LOW (ref 0.3–1.2)

## 2014-04-05 LAB — CBC
HEMATOCRIT: 19.9 % — AB (ref 39.0–52.0)
Hemoglobin: 6.1 g/dL — CL (ref 13.0–17.0)
MCH: 29.6 pg (ref 26.0–34.0)
MCHC: 30.7 g/dL (ref 30.0–36.0)
MCV: 96.6 fL (ref 78.0–100.0)
PLATELETS: 219 10*3/uL (ref 150–400)
RBC: 2.06 MIL/uL — ABNORMAL LOW (ref 4.22–5.81)
RDW: 16.4 % — AB (ref 11.5–15.5)
WBC: 7.3 10*3/uL (ref 4.0–10.5)

## 2014-04-05 LAB — POC OCCULT BLOOD, ED: FECAL OCCULT BLD: POSITIVE — AB

## 2014-04-05 LAB — PREPARE RBC (CROSSMATCH)

## 2014-04-05 MED ORDER — SODIUM CHLORIDE 0.9 % IV SOLN
Freq: Once | INTRAVENOUS | Status: AC
  Administered 2014-04-05: 22:00:00 via INTRAVENOUS

## 2014-04-05 NOTE — ED Notes (Signed)
Pt reports going to dialysis today due to large amount of fluid retained, pt was sent here due to low hgb, pt reports sob and fatigue, still wasn't able to finish his entire dialysis treatment today.

## 2014-04-05 NOTE — H&P (Signed)
Date: 04/05/2014               Patient Name:  [name] MRN: [id number]  DOB: Oct 06, 1955 Age / Sex: 59 y.o., male   PCP: Windy Kalata, MD         Medical Service: Internal Medicine Teaching Service         Attending Physician: Dr. Gilles Chiquito    First Contact: Dr. Venita Lick Pager: [phone]  Second Contact: Dr. Michail Jewels Pager: [phone]       After Hours (After 5p/  First Contact Pager: [phone]  weekends / holidays): Second Contact Pager: [phone]   Chief Complaint: Fatigue   History of Present Illness: [name] is a 58yo man w/ PMHx of HTN, normocytic anemia requiring multiple transfusions, hx of GI bleed, diastolic CHF, seizure disorder, and ESRD on HD (TThS) who presents to the ED with fatigue and lightheadedness. Patient reports he went to HD on Tuesday and was told that his hemoglobin was low and that he should go to the ED. He states he waited to come to the ED until today. Patient notes he has become progressively more lightheaded, fatigued, and short of breath when walking. He reports he went to HD yesterday and that his session was an extra 2 hours longer because his weight was up 10 kg. He denies any blood loss including melena, hematochezia, hemoptysis, hematemesis, and hematuria. He also denies any chest pain, abdominal pain, nausea, vomiting.   In the ED, he was found to have a positive orthostatics, hemoglobin of 6.1, and FOBT positive. He was transfused 2 units of PRBCs.   Meds: No current facility-administered medications for this encounter.   Current Outpatient Prescriptions  Medication Sig Dispense Refill  . calcium acetate (PHOSLO) 667 MG capsule Take 2,001 mg by mouth 3 (three) times daily with meals.     . hydrALAZINE (APRESOLINE) 100 MG tablet Take 100 mg by mouth 2 (two) times daily. DON'T TAKE ON DIALYSIS DAYS    . levETIRAcetam (KEPPRA) 1000 MG tablet Take 1,000 mg by mouth 2 (two) times daily.       Allergies: Allergies as of  04/05/2014 - Review Complete 04/05/2014  Allergen Reaction Noted  . Reglan [metoclopramide] Other (See Comments) 03/02/2012   Past Medical History  Diagnosis Date  . Hypertension   . Anemia, chronic disease     a. Capsule endoscopy reportedly negative 08/2013. b. seen by heme with concern for minor B cell population on BMB, being observed.  . Chronic diastolic CHF (congestive heart failure)     a. EF 60 - 65% per [place] echo 11/2011. b. Echo 02/2012: severe LVH, focal basal hypertrophy, EF 60-65%, mild Mr.  . Cocaine abuse     mentioned in notes from [facility]  . Hepatitis C antibody test positive     was HIV negative, 02/28/12  . Hepatitis B core antibody positive     03/01/10  . Positive QuantiFERON-TB Gold test     11/2011  . Helicobacter pylori gastritis     not defined if this was treated  . Polyp of colon, adenomatous     May 2012.  Dr Trenton Founds in [place]  . Hematochezia   . Head injury, closed, with concussion   . History of blood transfusion   . Type II diabetes mellitus   . Arthritis     "right shoulder" (11/09/2012)  . Seizure disorder     questionable history of - will need to clarify with PCP  .  Headache(784.0)   . ESRD on hemodialysis since 2012    a. Since 2012. ESRD was due to "drugs", primarily used cocaine.  Has 3-5 year hx of HTN, no DM.  Gets HD on TTS schedule at [facility]. Originally from [place].   . Hypercalcemia   . Hyperpotassemia   . Tobacco abuse   . Protein-calorie malnutrition, severe   . Optic neuritis, left   . Hypertensive heart disease    Past Surgical History  Procedure Laterality Date  . Shoulder open rotator cuff repair Right   . Total knee arthroplasty Left   . Bascilic vein transposition  03/07/2012    Procedure: BASCILIC VEIN TRANSPOSITION;  Surgeon: Conrad Silerton, MD;  Location: [facility];  Service: Vascular;  Laterality: Left;  First Stage  . Insertion of dialysis catheter      right chest  . Bascilic vein transposition Left  05/31/2012    Procedure: BASCILIC VEIN TRANSPOSITION;  Surgeon: Conrad Albion, MD;  Location: [facility];  Service: Vascular;  Laterality: Left;  Left 2nd Stage Basilic Vein Transposition with gortex graft revision using 69mmx10cm graft  . Shoulder open rotator cuff repair Right   . [name] capsule study N/A 08/27/2013    Procedure: [name] CAPSULE STUDY;  Surgeon: Milus Banister, MD;  Location: [facility];  Service: Endoscopy;  Laterality: N/A;   Family History  Problem Relation Age of Onset  . Diabetes Mother   . Hypertension Mother   . Stroke Mother   . Kidney failure Mother   . Cancer Father    History   Social History  . Marital Status: Single    Spouse Name: N/A    Number of Children: 1  . Years of Education: N/A   Occupational History  . Unemployed    Social History Main Topics  . Smoking status: Current Every Day Smoker -- 0.25 packs/day for 25 years    Types: Cigarettes  . Smokeless tobacco: Never Used  . Alcohol Use: No     Comment: 11/09/2012 "been stopped drinking 1-2 yr ago"  . Drug Use: Yes    Special: "Crack" cocaine, Marijuana     Comment: Prior crack cocaine, marijuana use  . Sexual Activity: Yes    Birth Control/ Protection: None   Other Topics Concern  . Not on file   Social History Narrative    Review of Systems: General: Denies fever, chills, night sweats, changes in appetite HEENT: Denies headaches, ear pain, changes in vision, rhinorrhea, sore throat CV: Denies palpitations, orthopnea Pulm: Denies cough, wheezing GI: See HPI GU: Denies dysuria, frequency Msk: Denies muscle cramps, joint pains Neuro: Denies weakness, numbness, tingling Skin: Denies rashes, bruising  Physical Exam: Blood pressure 111/56, pulse 81, temperature 98.7 F (37.1 C), temperature source Oral, resp. rate 18, SpO2 99 %. General: alert, sitting up in bed, NAD HEENT: [place]/AT, EOMI, sclera anicteric, conjunctiva pale, tongue pale Neck: no JVD CV: RRR, 2/6 systolic  murmur Pulm: CTA bilaterally, breaths non-labored Abd: BS+, soft, non-tender, non-distended Ext: warm, no edema, moves all Neuro: alert and oriented x 3, CNs II-XII intact, strength 5/5 in all extremities    Lab results: Basic Metabolic Panel:  Recent Labs  04/05/14 1950  NA 140  K 5.4*  CL 98  CO2 22  GLUCOSE 167*  BUN 69*  CREATININE 8.08*  CALCIUM 7.7*   Liver Function Tests:  Recent Labs  04/05/14 1950  AST 18  ALT 15  ALKPHOS 72  BILITOT <0.2*  PROT  6.3  ALBUMIN 2.7*   No results for input(s): LIPASE, AMYLASE in the last 72 hours. No results for input(s): AMMONIA in the last 72 hours. CBC:  Recent Labs  04/05/14 1950  WBC 7.3  HGB 6.1*  HCT 19.9*  MCV 96.6  PLT 219   Cardiac Enzymes: No results for input(s): CKTOTAL, CKMB, CKMBINDEX, TROPONINI in the last 72 hours. BNP: No results for input(s): PROBNP in the last 72 hours. D-Dimer: No results for input(s): DDIMER in the last 72 hours. CBG: No results for input(s): GLUCAP in the last 72 hours. Hemoglobin A1C: No results for input(s): HGBA1C in the last 72 hours. Fasting Lipid Panel: No results for input(s): CHOL, HDL, LDLCALC, TRIG, CHOLHDL, LDLDIRECT in the last 72 hours. Thyroid Function Tests: No results for input(s): TSH, T4TOTAL, FREET4, T3FREE, THYROIDAB in the last 72 hours. Anemia Panel: No results for input(s): VITAMINB12, FOLATE, FERRITIN, TIBC, IRON, RETICCTPCT in the last 72 hours. Coagulation: No results for input(s): LABPROT, INR in the last 72 hours. Urine Drug Screen: Drugs of Abuse     Component Value Date/Time   LABOPIA POSITIVE* 02/16/2014 0534   COCAINSCRNUR NONE DETECTED 02/16/2014 0534   LABBENZ NONE DETECTED 02/16/2014 0534   AMPHETMU NONE DETECTED 02/16/2014 0534   THCU NONE DETECTED 02/16/2014 0534   LABBARB NONE DETECTED 02/16/2014 0534    Alcohol Level: No results for input(s): ETH in the last 72 hours. Urinalysis: No results for input(s): COLORURINE,  LABSPEC, PHURINE, GLUCOSEU, HGBUR, BILIRUBINUR, KETONESUR, PROTEINUR, UROBILINOGEN, NITRITE, LEUKOCYTESUR in the last 72 hours.  Invalid input(s): APPERANCEUR   Imaging results:  Dg Chest 2 View  04/05/2014   CLINICAL DATA:  Low hemoglobin with shortness of breath. History of dialysis. History of hypertension.  EXAM: CHEST  2 VIEW  COMPARISON:  02/28/2014  FINDINGS: Normal cardiomediastinal silhouette. Calcified calcified aortic knob. Slight vascular congestion without overt infiltrates or failure. No effusion or pneumothorax. Degenerative change with previous surgery RIGHT shoulder.  IMPRESSION: Mild vascular congestion.  Slight worsening compared with priors.   Electronically Signed   By: Rolla Flatten M.D.   On: 04/05/2014 20:41    Other results: EKG: normal sinus rhythm, LVH, prolonged QTc, unchanged from prior   Assessment & Plan by Problem: Principal Problem:   Normocytic anemia Active Problems:   Hypertension   ESRD on hemodialysis   Seizure disorder   GI bleed   Orthostatic hypotension   Hyperkalemia Mr. Hinshaw is a 57yo man w/ PMHx of HTN, normocytic anemia requiring multiple transfusions, hx of GI bleed, diastolic CHF, seizure disorder, and ESRD on HD (TThS) who presents with fatigue and lightheadedness 2/2 anemia.   Symptomatic Anemia: Patient presents with lightheadedness and fatigue for last 4 days. Hbg found to be 6.1 in ED. FOBT positive. He denies any bleeding. Patient was transfused 2 units in the ED. He has a history of normocytic anemia, which has been worked up extensively and likely multifactorial.   He was hospitalized in 02/2012 and had a Hbg 5.3, requiring blood transfusion. Records were obtained from [facility] from Dec 2012 which showed he was FOBT positive at that time, but no source of bleeding found on EGD, capsule, or colonoscopy. He does have a history of H. pylori. Labs showed elevated LDH, normal haptoglobin, low retic count, high ferritin and TIBC, neg direct  coombs, and nonspecific SPEP. Hematology was consulted during that 2013 admission and suspected a BM process given his low retic count and no evidence of active hemolysis (elevated LDH but  normal haptoglobin). Patient refused BM biopsy at that time. Patient then saw Dr. Juliann Mule (oncology) in Oct 2014 and noted to have a BM biopsy (12/2012), which showed a minor B cell population (14% of lymphocytes) concerning for a B cell lymphoproliferative process. A repeat BM biopsy was done on 06/25/13 that showed atypical B cells worrisome for a follicular lymphoma or B cell lymphoproliferative process. Oncology believes his anemia to be most likely related to his ESRD.   Patient had a repeat capsule endoscopy on 08/28/13 which showed mild erythema and edema in the mid-small bowel, not felt to be cause of his anemia. His baseline Hbg 8.5-9.5 over last few months. Anemia likely multifactorial given his hx of ESRD, occult GI bleed (hematochezia, FOBT+), iron deficiency anemia, BM disorder, history of H.pylori, hx of use of NSAIDs, and he has been on and off of erythropoietin. Acute GI bleed less likely since recent capsule endoscopy normal and patient denies any bleeding. Most likely a result of his ESRD and BM disorder. Will repeat anemia studies.  Plan - f/u post-transfusion CBC - Transfuse additionally if Hbg < 7.0 - Iron, TIBC, ferritin - Haptoglobin, LDH - Retic count - Peripheral smear - Folate and Vitamin B12 - Can consider GI consult, but likely not necessary given recent study - Pt should f/u with heme/onc Dr.Chism  ESRD on HD (TThS): Patient reports he is up 10 kg from his baseline weight. Appears that his baseline weight is ~130 lbs (60 kg). He had an extended session of dialysis yesterday.  - Consult Renal in AM for HD  Hyperkalemia: K 5.4 on admission. Last had HD yesterday, which was an extended session (2 hours longer).  - Kayexalate 30 g once - Repeat bmet in AM  HTN: BP 107/53 on admission, now  stable in 665-993T systolic. Patient normally has systolic pressures in 701X. Currently takes Hydralazine 100 mg BID on non-dialysis days. Not very compliant with his medications. He was recently taken off of Labetalol and Amlodipine, unclear why.  - Hold Hydralazine for now   Seizure Disorder: He takes Keppra 1000 mg BID at home. - Continue home Keppra  Diet: Renal with 1200 ml fluid restriction, NPO at midnight VTE PPx: SCDs Dispo: Disposition is deferred at this time, awaiting improvement of current medical problems. Anticipated discharge in approximately 2-3 day(s).   The patient does have a current PCP Windy Kalata, MD) and does need an [facility] hospital follow-up appointment after discharge.  The patient does not have transportation limitations that hinder transportation to clinic appointments.  Signed: Albin Felling, MD 04/05/2014, 10:35 PM

## 2014-04-05 NOTE — ED Notes (Signed)
Admitting at bedside 

## 2014-04-05 NOTE — ED Provider Notes (Addendum)
CSN: [id number]     Arrival date & time 04/05/14  1854 History   First MD Initiated Contact with Patient 04/05/14 1911     Chief Complaint  Patient presents with  . Fatigue  . Abnormal Lab     (Consider location/radiation/quality/duration/timing/severity/associated sxs/prior Treatment) The history is provided by the patient.  pt with hx esrd on hd, presents from HD, pt reported was told his hgb was low and that he needed to come to ED.  Pt states he has been feeling generally weak lately, occasionally lightheaded when stands. States hx anemia, w multiple transfusions in past. Denies recent bleeding or blood loss. No rectal bleeding or melena. Normally gets hd Tues/Thurs/Sat, had normal HD yesterday, and had 2 additional hours today 'due to fluid build up'.  Goes to [facility] for HD.  Pt denies cp or sob. No abd pain or nvd. No fever or chills.      Past Medical History  Diagnosis Date  . Hypertension   . Anemia, chronic disease     a. Capsule endoscopy reportedly negative 08/2013. b. seen by heme with concern for minor B cell population on BMB, being observed.  . Chronic diastolic CHF (congestive heart failure)     a. EF 60 - 65% per [place] echo 11/2011. b. Echo 02/2012: severe LVH, focal basal hypertrophy, EF 60-65%, mild Mr.  . Cocaine abuse     mentioned in notes from [place]  . Hepatitis C antibody test positive     was HIV negative, 02/28/12  . Hepatitis B core antibody positive     03/01/10  . Positive QuantiFERON-TB Gold test     11/2011  . Helicobacter pylori gastritis     not defined if this was treated  . Polyp of colon, adenomatous     May 2012.  Dr Trenton Founds in [place]  . Hematochezia   . Head injury, closed, with concussion   . History of blood transfusion   . Type II diabetes mellitus   . Arthritis     "right shoulder" (11/09/2012)  . Seizure disorder     questionable history of - will need to clarify with PCP  . Headache(784.0)   . ESRD on hemodialysis  since 2012    a. Since 2012. ESRD was due to "drugs", primarily used cocaine.  Has 3-5 year hx of HTN, no DM.  Gets HD on TTS schedule at [facility]. Originally from [place].   . Hypercalcemia   . Hyperpotassemia   . Tobacco abuse   . Protein-calorie malnutrition, severe   . Optic neuritis, left   . Hypertensive heart disease    Past Surgical History  Procedure Laterality Date  . Shoulder open rotator cuff repair Right   . Total knee arthroplasty Left   . Bascilic vein transposition  03/07/2012    Procedure: BASCILIC VEIN TRANSPOSITION;  Surgeon: Conrad Trinity, MD;  Location: [facility];  Service: Vascular;  Laterality: Left;  First Stage  . Insertion of dialysis catheter      right chest  . Bascilic vein transposition Left 05/31/2012    Procedure: BASCILIC VEIN TRANSPOSITION;  Surgeon: Conrad Channahon, MD;  Location: [facility];  Service: Vascular;  Laterality: Left;  Left 2nd Stage Basilic Vein Transposition with gortex graft revision using 75mmx10cm graft  . Shoulder open rotator cuff repair Right   . [name] capsule study N/A 08/27/2013    Procedure: [name] CAPSULE STUDY;  Surgeon: Milus Banister, MD;  Location: [facility];  Service: Endoscopy;  Laterality: N/A;   Family History  Problem Relation Age of Onset  . Diabetes Mother   . Hypertension Mother   . Stroke Mother   . Kidney failure Mother   . Cancer Father    History  Substance Use Topics  . Smoking status: Current Every Day Smoker -- 0.25 packs/day for 25 years    Types: Cigarettes  . Smokeless tobacco: Never Used  . Alcohol Use: No     Comment: 11/09/2012 "been stopped drinking 1-2 yr ago"    Review of Systems  Constitutional: Negative for fever and chills.  HENT: Negative for nosebleeds.   Eyes: Negative for redness.  Respiratory: Negative for cough and shortness of breath.   Cardiovascular: Negative for chest pain.  Gastrointestinal: Negative for vomiting, abdominal pain, diarrhea and blood in stool.  Genitourinary:  Negative for hematuria and flank pain.  Musculoskeletal: Negative for back pain and neck pain.  Skin: Negative for rash.  Neurological: Negative for headaches.  Hematological: Does not bruise/bleed easily.  Psychiatric/Behavioral: Negative for confusion.      Allergies  Reglan  Home Medications   Prior to Admission medications   Medication Sig Start Date End Date Taking? Authorizing Provider  calcium acetate (PHOSLO) 667 MG capsule Take 2,001 mg by mouth 3 (three) times daily with meals.     Historical Provider, MD  hydrALAZINE (APRESOLINE) 100 MG tablet Take 100 mg by mouth 2 (two) times daily. DON'T TAKE ON DIALYSIS DAYS    Historical Provider, MD  levETIRAcetam (KEPPRA) 1000 MG tablet Take 1,000 mg by mouth 2 (two) times daily.     Historical Provider, MD   BP 115/56 mmHg  Pulse 63  Temp(Src) 97.7 F (36.5 C) (Oral)  Resp 19  SpO2 100% Physical Exam  Constitutional: [name] oriented to person, place, and time. He appears well-developed and well-nourished. No distress.  HENT:  Head: Atraumatic.  Mouth/Throat: Oropharynx is clear and moist.  Eyes: Conjunctivae are normal. No scleral icterus.  Neck: Neck supple. No tracheal deviation present.  Cardiovascular: Normal rate, regular rhythm, normal heart sounds and intact distal pulses.   Pulmonary/Chest: Effort normal and breath sounds normal. No accessory muscle usage. No respiratory distress.  Abdominal: Soft. Bowel sounds are normal. He exhibits no distension and no mass. There is no tenderness. There is no rebound and no guarding.  Musculoskeletal: Normal range of motion. He exhibits no edema or tenderness.  Left upper arm hd graft w thrill  Neurological: [name] alert and oriented to person, place, and time.  Skin: Skin is warm and dry. [name] not diaphoretic. There is pallor.  Psychiatric: He has a normal mood and affect.  Nursing note and vitals reviewed.   ED Course  Procedures (including critical care time) Labs  Review  Results for orders placed or performed during the hospital encounter of 04/05/14  CBC  Result Value Ref Range   WBC 7.3 4.0 - 10.5 K/uL   RBC 2.06 (L) 4.22 - 5.81 MIL/uL   Hemoglobin 6.1 (LL) 13.0 - 17.0 g/dL   HCT 19.9 (L) 39.0 - 52.0 %   MCV 96.6 78.0 - 100.0 fL   MCH 29.6 26.0 - 34.0 pg   MCHC 30.7 30.0 - 36.0 g/dL   RDW 16.4 (H) 11.5 - 15.5 %   Platelets 219 150 - 400 K/uL  Comprehensive metabolic panel  Result Value Ref Range   Sodium 140 137 - 147 mEq/L   Potassium 5.4 (H) 3.7 - 5.3 mEq/L  Chloride 98 96 - 112 mEq/L   CO2 22 19 - 32 mEq/L   Glucose, Bld 167 (H) 70 - 99 mg/dL   BUN 69 (H) 6 - 23 mg/dL   Creatinine, Ser 8.08 (H) 0.50 - 1.35 mg/dL   Calcium 7.7 (L) 8.4 - 10.5 mg/dL   Total Protein 6.3 6.0 - 8.3 g/dL   Albumin 2.7 (L) 3.5 - 5.2 g/dL   AST 18 0 - 37 U/L   ALT 15 0 - 53 U/L   Alkaline Phosphatase 72 39 - 117 U/L   Total Bilirubin <0.2 (L) 0.3 - 1.2 mg/dL   GFR calc non Af Amer 7 (L) >90 mL/min   GFR calc Af Amer 8 (L) >90 mL/min   Anion gap 20 (H) 5 - 15  POC occult blood, ED Provider will collect  Result Value Ref Range   Fecal Occult Bld POSITIVE (A) NEGATIVE  Type and screen  Result Value Ref Range   ABO/RH(D) A POS    Antibody Screen NEG    Sample Expiration 04/08/2014    Unit Number [id number]    Blood Component Type RED CELLS,LR    Unit division 00    Status of Unit ALLOCATED    Transfusion Status OK TO TRANSFUSE    Crossmatch Result Compatible   Prepare RBC  Result Value Ref Range   Order Confirmation ORDER PROCESSED BY BLOOD BANK    Dg Chest 2 View  04/05/2014   CLINICAL DATA:  Low hemoglobin with shortness of breath. History of dialysis. History of hypertension.  EXAM: CHEST  2 VIEW  COMPARISON:  02/28/2014  FINDINGS: Normal cardiomediastinal silhouette. Calcified calcified aortic knob. Slight vascular congestion without overt infiltrates or failure. No effusion or pneumothorax. Degenerative change with previous surgery RIGHT  shoulder.  IMPRESSION: Mild vascular congestion.  Slight worsening compared with priors.   Electronically Signed   By: Rolla Flatten M.D.   On: 04/05/2014 20:41        EKG Interpretation   Date/Time:  Friday April 05 2014 19:03:47 EST Ventricular Rate:  94 PR Interval:  152 QRS Duration: 96 QT Interval:  384 QTC Calculation: 480 R Axis:   61 Text Interpretation:  Normal sinus rhythm Left ventricular hypertrophy  with repolarization abnormality Non-specific ST-t changes Prolonged QT  Confirmed by Ashok Cordia  MD, Lennette Bihari ([id number]) on 04/05/2014 7:16:10 PM      MDM   Iv ns .  Labs.  Reviewed nursing notes and prior charts for additional history.   hgb 6.1, approx 3 gm decreased from prior.  Pt symptomatic, light headed upon standing, sitting upright bp 88/50, heme pos stool  Will transfuse 1 unit and admit to med service.    Admit to unassigned medicine (pt indicates has no pcp).  They can consult renal in AM for his HD.  Discussed w fpc - capped. Discussed with int med roc - will admit.   Recheck pt alert, abd soft nt. No cp or sob.      Mirna Mires, MD 04/05/14 [phone]

## 2014-04-05 NOTE — ED Notes (Signed)
Called [facility] where pt resides per pt request so pt does not lose bed

## 2014-04-06 DIAGNOSIS — Z992 Dependence on renal dialysis: Secondary | ICD-10-CM

## 2014-04-06 DIAGNOSIS — R0602 Shortness of breath: Secondary | ICD-10-CM | POA: Diagnosis present

## 2014-04-06 DIAGNOSIS — Z823 Family history of stroke: Secondary | ICD-10-CM | POA: Diagnosis not present

## 2014-04-06 DIAGNOSIS — Z888 Allergy status to other drugs, medicaments and biological substances status: Secondary | ICD-10-CM | POA: Diagnosis not present

## 2014-04-06 DIAGNOSIS — F1290 Cannabis use, unspecified, uncomplicated: Secondary | ICD-10-CM | POA: Diagnosis present

## 2014-04-06 DIAGNOSIS — Z833 Family history of diabetes mellitus: Secondary | ICD-10-CM | POA: Diagnosis not present

## 2014-04-06 DIAGNOSIS — E119 Type 2 diabetes mellitus without complications: Secondary | ICD-10-CM | POA: Diagnosis present

## 2014-04-06 DIAGNOSIS — N186 End stage renal disease: Secondary | ICD-10-CM | POA: Diagnosis present

## 2014-04-06 DIAGNOSIS — R569 Unspecified convulsions: Secondary | ICD-10-CM

## 2014-04-06 DIAGNOSIS — I120 Hypertensive chronic kidney disease with stage 5 chronic kidney disease or end stage renal disease: Secondary | ICD-10-CM

## 2014-04-06 DIAGNOSIS — Z96652 Presence of left artificial knee joint: Secondary | ICD-10-CM | POA: Diagnosis present

## 2014-04-06 DIAGNOSIS — M1990 Unspecified osteoarthritis, unspecified site: Secondary | ICD-10-CM | POA: Diagnosis present

## 2014-04-06 DIAGNOSIS — D649 Anemia, unspecified: Secondary | ICD-10-CM | POA: Diagnosis present

## 2014-04-06 DIAGNOSIS — Z720 Tobacco use: Secondary | ICD-10-CM | POA: Diagnosis not present

## 2014-04-06 DIAGNOSIS — D479 Neoplasm of uncertain behavior of lymphoid, hematopoietic and related tissue, unspecified: Secondary | ICD-10-CM | POA: Diagnosis present

## 2014-04-06 DIAGNOSIS — K922 Gastrointestinal hemorrhage, unspecified: Secondary | ICD-10-CM | POA: Diagnosis present

## 2014-04-06 DIAGNOSIS — R195 Other fecal abnormalities: Secondary | ICD-10-CM | POA: Diagnosis present

## 2014-04-06 DIAGNOSIS — F1410 Cocaine abuse, uncomplicated: Secondary | ICD-10-CM | POA: Diagnosis present

## 2014-04-06 DIAGNOSIS — Z8249 Family history of ischemic heart disease and other diseases of the circulatory system: Secondary | ICD-10-CM | POA: Diagnosis not present

## 2014-04-06 DIAGNOSIS — D500 Iron deficiency anemia secondary to blood loss (chronic): Secondary | ICD-10-CM

## 2014-04-06 DIAGNOSIS — N2581 Secondary hyperparathyroidism of renal origin: Secondary | ICD-10-CM | POA: Diagnosis present

## 2014-04-06 DIAGNOSIS — E875 Hyperkalemia: Secondary | ICD-10-CM

## 2014-04-06 DIAGNOSIS — Z9115 Patient's noncompliance with renal dialysis: Secondary | ICD-10-CM | POA: Diagnosis present

## 2014-04-06 DIAGNOSIS — I951 Orthostatic hypotension: Secondary | ICD-10-CM | POA: Diagnosis present

## 2014-04-06 DIAGNOSIS — Z590 Homelessness: Secondary | ICD-10-CM | POA: Diagnosis not present

## 2014-04-06 DIAGNOSIS — K921 Melena: Secondary | ICD-10-CM | POA: Insufficient documentation

## 2014-04-06 DIAGNOSIS — G40909 Epilepsy, unspecified, not intractable, without status epilepticus: Secondary | ICD-10-CM | POA: Diagnosis present

## 2014-04-06 DIAGNOSIS — I5032 Chronic diastolic (congestive) heart failure: Secondary | ICD-10-CM | POA: Diagnosis present

## 2014-04-06 LAB — CBC
HCT: 19.5 % — ABNORMAL LOW (ref 39.0–52.0)
HCT: 27.5 % — ABNORMAL LOW (ref 39.0–52.0)
HEMOGLOBIN: 8.9 g/dL — AB (ref 13.0–17.0)
Hemoglobin: 6.1 g/dL — CL (ref 13.0–17.0)
MCH: 29.6 pg (ref 26.0–34.0)
MCH: 29.4 pg (ref 26.0–34.0)
MCHC: 31.3 g/dL (ref 30.0–36.0)
MCHC: 32.4 g/dL (ref 30.0–36.0)
MCV: 94.7 fL (ref 78.0–100.0)
MCV: 90.8 fL (ref 78.0–100.0)
Platelets: 203 10*3/uL (ref 150–400)
Platelets: 207 10*3/uL (ref 150–400)
RBC: 2.06 MIL/uL — ABNORMAL LOW (ref 4.22–5.81)
RBC: 3.03 MIL/uL — ABNORMAL LOW (ref 4.22–5.81)
RDW: 16.5 % — ABNORMAL HIGH (ref 11.5–15.5)
RDW: 16 % — AB (ref 11.5–15.5)
WBC: 7.7 10*3/uL (ref 4.0–10.5)
WBC: 8.2 10*3/uL (ref 4.0–10.5)

## 2014-04-06 LAB — IRON AND TIBC
Iron: 94 ug/dL (ref 42–135)
SATURATION RATIOS: 45 % (ref 20–55)
TIBC: 210 ug/dL — ABNORMAL LOW (ref 215–435)
UIBC: 116 ug/dL — ABNORMAL LOW (ref 125–400)

## 2014-04-06 LAB — BASIC METABOLIC PANEL
Anion gap: 18 — ABNORMAL HIGH (ref 5–15)
BUN: 76 mg/dL — ABNORMAL HIGH (ref 6–23)
CO2: 24 mEq/L (ref 19–32)
Calcium: 7.3 mg/dL — ABNORMAL LOW (ref 8.4–10.5)
Chloride: 102 mEq/L (ref 96–112)
Creatinine, Ser: 9.45 mg/dL — ABNORMAL HIGH (ref 0.50–1.35)
GFR calc Af Amer: 6 mL/min — ABNORMAL LOW (ref >90)
GFR, EST NON AFRICAN AMERICAN: 5 mL/min — AB (ref >90)
GLUCOSE: 88 mg/dL (ref 70–99)
POTASSIUM: 4.5 meq/L (ref 3.7–5.3)
SODIUM: 144 meq/L (ref 137–147)

## 2014-04-06 LAB — VITAMIN B12: Vitamin B-12: 557 pg/mL (ref 211–911)

## 2014-04-06 LAB — PREPARE RBC (CROSSMATCH)

## 2014-04-06 LAB — RETICULOCYTES
RBC.: 2.16 MIL/uL — ABNORMAL LOW (ref 4.22–5.81)
RETIC CT PCT: 4.4 % — AB (ref 0.4–3.1)
Retic Count, Absolute: 95 10*3/uL (ref 19.0–186.0)

## 2014-04-06 LAB — FERRITIN: Ferritin: 514 ng/mL — ABNORMAL HIGH (ref 22–322)

## 2014-04-06 LAB — TECHNOLOGIST SMEAR REVIEW

## 2014-04-06 LAB — FOLATE: Folate: 11.8 ng/mL

## 2014-04-06 LAB — LACTATE DEHYDROGENASE: LDH: 218 U/L (ref 94–250)

## 2014-04-06 MED ORDER — RENA-VITE PO TABS
1.0000 | ORAL_TABLET | Freq: Every day | ORAL | Status: DC
  Administered 2014-04-06: 1 via ORAL
  Filled 2014-04-06 (×2): qty 1

## 2014-04-06 MED ORDER — SODIUM POLYSTYRENE SULFONATE 15 GM/60ML PO SUSP
30.0000 g | Freq: Once | ORAL | Status: AC
  Administered 2014-04-06: 30 g via ORAL
  Filled 2014-04-06: qty 120

## 2014-04-06 MED ORDER — SODIUM CHLORIDE 0.9 % IV SOLN: Freq: Once | INTRAVENOUS | Status: DC

## 2014-04-06 MED ORDER — CALCIUM ACETATE 667 MG PO CAPS
2001.0000 mg | ORAL_CAPSULE | Freq: Three times a day (TID) | ORAL | Status: DC
  Administered 2014-04-06 – 2014-04-07 (×4): 2001 mg via ORAL
  Filled 2014-04-06 (×7): qty 3

## 2014-04-06 MED ORDER — DOXERCALCIFEROL 4 MCG/2ML IV SOLN
3.0000 ug | INTRAVENOUS | Status: DC
  Administered 2014-04-06: 3 ug via INTRAVENOUS
  Filled 2014-04-06: qty 2

## 2014-04-06 MED ORDER — LEVETIRACETAM 500 MG PO TABS
1000.0000 mg | ORAL_TABLET | Freq: Two times a day (BID) | ORAL | Status: DC
  Administered 2014-04-06 – 2014-04-07 (×3): 1000 mg via ORAL
  Filled 2014-04-06 (×5): qty 2

## 2014-04-06 MED ORDER — DARBEPOETIN ALFA 200 MCG/0.4ML IJ SOSY: 200.0000 ug | PREFILLED_SYRINGE | INTRAMUSCULAR | Status: DC

## 2014-04-06 MED ORDER — NEPRO/CARBSTEADY PO LIQD
237.0000 mL | Freq: Two times a day (BID) | ORAL | Status: DC
  Administered 2014-04-07: 237 mL via ORAL

## 2014-04-06 MED ORDER — DOXERCALCIFEROL 4 MCG/2ML IV SOLN
INTRAVENOUS | Status: AC
  Administered 2014-04-06: 3 ug via INTRAVENOUS
  Filled 2014-04-06: qty 2

## 2014-04-06 NOTE — Progress Notes (Signed)
  Date: 04/06/2014  Patient name: [name]  Medical record number: [id number]  Date of birth: December 01, 1955   I have seen and evaluated [name] and discussed their care with the Residency Team. [name] is a 57yo man with PMH of HTN, DM2, ESRD on HD, Seizure disorder and a long history of normocytic anemia requiring blood transfusions.  He has required blood transfusions on multiple occasions.  He has had imaging including endoscopies multiple times without an acute source of bleeding found.  He has a history of ESRD which is likely contributing to his anemia.  He has further been worked up by Oncology (Dr. Juliann Mule) and had a BM biopsy X 2 which show atypical B cells. He reports that he has been having symptoms of fatigue and lightheadedness at home.  He reported to dialysis on Thursday and was told that he was orthostatic and his Hgb was low.  They advised him to seek care at the hospital, however, he did not report to the ED until Friday night.  He was found at that time to be orthostatic, FOBT + and with a Hgb of 6.1.  He was given 1 unit of PRBC.    On exam, the patient is alert and answering questions appropriately, he is breathing comfortably, no wheezing or SOB, NR, RR, no murmur noted, abdomen soft with + BS, legs are thin.  He does not have SCDs on.    Assessment and Plan: I have seen and evaluated the patient as outlined above. I agree with the formulated Assessment and Plan as detailed in the residents' admission note, with the following changes:   1. Symptomatic chronic anemia, acute loss with underlying ESRD and possibly related to an acute lymphoproliferative process - Trend CBC and transfuse for < 7 - Iron panel, haptoglobin, LDH pending - Retic count 4.4 (high) - Hold on GI consult for now, given extensive work up with out cause found - Will need outpatient follow up with GI.   2. ESRD on HD - consult renal, due for HD today  3. Hyperkalemia - Mild, can likely hold off on  further kayexalate and expect improvement post HD  4. H/O HTN - BP fluctuating - Holding home hydralazine for now until H/H improve  5. Seizure d/o, continue keppra.   For further details, please see resident admission note.   Sid Falcon, MD 12/19/201510:17 AM

## 2014-04-06 NOTE — Consult Note (Signed)
[facility] Renal Consultation Note    Indication for Consultation:  Management of ESRD/hemodialysis; anemia, hypertension/volume and secondary hyperparathyroidism PCP:?? (Dr. Mercy Moore is nephrologist NOT PCP)  HPI: [name] is a 58 y.o. male with ESRD on TTS dialysis at [facility] who presented to the ED yesterday with fatigue and dizziness.  He was encouraged to go to the ED 12/12 for transfusion when his Hgb was found to be 7.2, but he refused.  He subsequently clotted his AVF and had a declot 12/18 and was sent to the ED afterwards due to aforementioned reasons.   His Aranesp was increased from 50 to 200 12/15 and repeat Hgb was down to 6.3 12/17.  hgb was 6.1 when he presented to [facility] ED. He denies SOB, CP, BRBPR, melana, N, V, abdominal pain or hematuria, just dizziness at times when walking.  Hungry now, wants something else to eat - ate all of breakfast.  Past Medical History  Diagnosis Date  . Hypertension   . Anemia, chronic disease     a. Capsule endoscopy reportedly negative 08/2013. b. seen by heme with concern for minor B cell population on BMB, being observed.  . Chronic diastolic CHF (congestive heart failure)     a. EF 60 - 65% per [place] echo 11/2011. b. Echo 02/2012: severe LVH, focal basal hypertrophy, EF 60-65%, mild Mr.  . Cocaine abuse     mentioned in notes from [place]  . Hepatitis C antibody test positive     was HIV negative, 02/28/12  . Hepatitis B core antibody positive     03/01/10  . Positive QuantiFERON-TB Gold test     11/2011  . Helicobacter pylori gastritis     not defined if this was treated  . Polyp of colon, adenomatous     May 2012.  Dr Trenton Founds in [place]  . Hematochezia   . Head injury, closed, with concussion   . History of blood transfusion   . Type II diabetes mellitus   . Arthritis     "right shoulder" (11/09/2012)  . Seizure disorder     questionable history of - will need to clarify with PCP  .  Headache(784.0)   . ESRD on hemodialysis since 2012    a. Since 2012. ESRD was due to "drugs", primarily used cocaine.  Has 3-5 year hx of HTN, no DM.  Gets HD on TTS schedule at [facility]. Originally from [place].   . Hypercalcemia   . Hyperpotassemia   . Tobacco abuse   . Protein-calorie malnutrition, severe   . Optic neuritis, left   . Hypertensive heart disease    Past Surgical History  Procedure Laterality Date  . Shoulder open rotator cuff repair Right   . Total knee arthroplasty Left   . Bascilic vein transposition  03/07/2012    Procedure: BASCILIC VEIN TRANSPOSITION;  Surgeon: Conrad Othello, MD;  Location: [facility];  Service: Vascular;  Laterality: Left;  First Stage  . Insertion of dialysis catheter      right chest  . Bascilic vein transposition Left 05/31/2012    Procedure: BASCILIC VEIN TRANSPOSITION;  Surgeon: Conrad Milton, MD;  Location: [facility];  Service: Vascular;  Laterality: Left;  Left 2nd Stage Basilic Vein Transposition with gortex graft revision using 65mmx10cm graft  . Shoulder open rotator cuff repair Right   . [name] capsule study N/A 08/27/2013    Procedure: [name] CAPSULE STUDY;  Surgeon: Milus Banister, MD;  [facility]  ENDOSCOPY;  Service: Endoscopy;  Laterality: N/A;   Family History  Problem Relation Age of Onset  . Diabetes Mother   . Hypertension Mother   . Stroke Mother   . Kidney failure Mother   . Cancer Father    Social History:  reports that he has been smoking Cigarettes.  He has a 6.25 pack-year smoking history. He has never used smokeless tobacco. He reports that he uses illicit drugs ("Crack" cocaine and Marijuana). He reports that he does not drink alcohol. Allergies  Allergen Reactions  . Reglan [Metoclopramide] Other (See Comments)    Tardive dyskinesia in 11/2011 in [place]   Prior to Admission medications   Medication Sig Start Date End Date Taking? Authorizing Provider  calcium acetate (PHOSLO) 667 MG capsule Take 2,001 mg by  mouth 3 (three) times daily with meals.    Yes Historical Provider, MD  hydrALAZINE (APRESOLINE) 100 MG tablet Take 100 mg by mouth 2 (two) times daily. DON'T TAKE ON DIALYSIS DAYS   Yes Historical Provider, MD  levETIRAcetam (KEPPRA) 1000 MG tablet Take 1,000 mg by mouth 2 (two) times daily.    Yes Historical Provider, MD   Current Facility-Administered Medications  Medication Dose Route Frequency Provider Last Rate Last Dose  . 0.9 %  sodium chloride infusion   Intravenous Once Albin Felling, MD      . calcium acetate (PHOSLO) capsule 2,001 mg  2,001 mg Oral TID WC Jessee Avers, MD   2,001 mg at 04/06/14 1100  . [START ON 04/09/2014] Darbepoetin Alfa (ARANESP) injection 200 mcg  200 mcg Intravenous Q Tue-HD Myriam Jacobson, PA-C      . doxercalciferol (HECTOROL) injection 3 mcg  3 mcg Intravenous Q T,Th,Sa-HD Myriam Jacobson, PA-C      . levETIRAcetam (KEPPRA) tablet 1,000 mg  1,000 mg Oral BID Jessee Avers, MD   1,000 mg at 04/06/14 0117   Labs: Basic Metabolic Panel:  Recent Labs Lab 04/05/14 1950  NA 140  K 5.4*  CL 98  CO2 22  GLUCOSE 167*  BUN 69*  CREATININE 8.08*  CALCIUM 7.7*   Liver Function Tests:  Recent Labs Lab 04/05/14 1950  AST 18  ALT 15  ALKPHOS 72  BILITOT <0.2*  PROT 6.3  ALBUMIN 2.7*  CBC:  Recent Labs Lab 04/05/14 1950 04/06/14 0445  WBC 7.3 7.7  HGB 6.1* 6.1*  HCT 19.9* 19.5*  MCV 96.6 94.7  PLT 219 203   Studies/Results: Dg Chest 2 View  04/05/2014   CLINICAL DATA:  Low hemoglobin with shortness of breath. History of dialysis. History of hypertension.  EXAM: CHEST  2 VIEW  COMPARISON:  02/28/2014  FINDINGS: Normal cardiomediastinal silhouette. Calcified calcified aortic knob. Slight vascular congestion without overt infiltrates or failure. No effusion or pneumothorax. Degenerative change with previous surgery RIGHT shoulder.  IMPRESSION: Mild vascular congestion.  Slight worsening compared with priors.   Electronically Signed   By:  Rolla Flatten M.D.   On: 04/05/2014 20:41   ROS: As per HPI with pertinent positives and negatives  Physical Exam: Filed Vitals:   04/06/14 0030 04/06/14 0113 04/06/14 0136 04/06/14 0425  BP: 123/59 113/45 146/58 114/51  Pulse: 81 79 85 80  Temp:   97.6 F (36.4 C) 98.8 F (37.1 C)  TempSrc:   Oral Oral  Resp:  20 18 18   Height:   5\' 9"  (1.753 m)   Weight:   68.266 kg (150 lb 8 oz)   SpO2: 97% 95% 100% 97%  General:  Slender AA male  in no acute distress.  Head: Normocephalic, atraumatic, sclera non-icteric, mucus membranes are moist Neck: Supple. JVD not elevated. Lungs: Clear bilaterally to auscultation without wheezes, rales, or rhonchi. Breathing is unlabored. Heart: RRR with S1 S2. 2/6 murmur Abdomen: Soft, non-tender, non-distended with normoactive bowel sounds. No rebound/guarding. No obvious abdominal masses. M-S:  Strength and tone appear normal for markedly diminished for age. Lower extremities:without edema or ischemic changes, no open wounds / skin cracked /dry on shins Neuro: Alert and oriented X 3. Moves all extremities spontaneously. Psych:  Responds to questions appropriately with a normal affect. Dialysis Access: left AVF with 3 retention sutures + bruit  Dialysis Orders:  TTS @ AF 180 4 hours 60kgs 2K/2.25 400/1.5 No Heparin L AVF Hectorol 3 Venofer none Aranesp 200 last given 12 /15 - prior to that was on Aranesp 5012/8 and 11/23 Recent labs:  Hgb 9 11/19, 8.4 11/23, 7.2 12/10 6.3 12/17   32% sat 11/23  Other: recent delot: 12/18 PTA 64% outflow baslic vein stenosis and 68% inflow basilic vein  Assessment/Plan: 1. Symtomatic anemia with + FOBT - Hgb 6.1 from 7.2 12/10 - s/p 1 unit in ED - to receive another 2 units on HD today; Hgb drop consistant with slow GI blood loss; continue ESA - await Fe studies; previous work-up well summarized by Internal Medicine; 2. ESRD -  TTS - HD today per routine with recent intervention for declot as per above;  chronically  Not running full treatments and not getting to edw usual post weight ~ 65 kg 3. Hypertension/volume  - BP ok here;  highly variable pre and post HD -  CXR on adm mild vasc congestion; Dr. Mercy Moore stopped BP meds 11/23; BP control via volume removal- only getting to within 5 kg of EDW due to high goals, may need to titrate down with extra treatment - see how he does today 4. Metabolic bone disease -  Continue Hectorol 3, continue binders 3 phsolo ac 5. Nutrition - add supplements to augment kcal 6. Substance abuse 7. Sz disorder - on keppra 8. Medical and dialysis noncompliance - ongoing complicating issue 9. Homelessness - resides at [place], [place] [phone] 04/06/2014, 11:00 AM   Pt seen, examined and agree w A/P as above.  Kelly Splinter MD pager [phone]    cell [phone] 04/06/2014, 6:59 PM

## 2014-04-06 NOTE — Progress Notes (Signed)
New Admission Note:   Arrival Method:  Via stretcher Mental Orientation: A&Ox4 Telemetry: NSR Assessment: Completed Skin:INTACT IV: NSL Pain: NONE Tubes: NONE Safety Measures: Safety Fall Prevention Plan has been given, discussed and signed Admission: Completed [facility] [place] Orientation: Patient has been orientated to the room, unit and staff.  Family: PT HOMELESS. NONE PRESENT  Orders have been reviewed and implemented. Will continue to monitor the patient. Call light has been placed within reach and bed alarm has been activated.   Dudley Major BSN, Consulting civil engineer number: [phone]

## 2014-04-06 NOTE — Procedures (Signed)
I was present at this dialysis session, have reviewed the session itself and made  appropriate changes  Kelly Splinter MD (pgr) [phone]    [phone] 04/06/2014, 6:59 PM

## 2014-04-06 NOTE — Progress Notes (Signed)
Subjective: [name] feels "tired" this morning. [name] also very hungry. He denies chest pain or shortness of breath. He admits to dizziness yesterday, but none currently.  Objective: Vital signs in last 24 hours: Filed Vitals:   04/06/14 0030 04/06/14 0113 04/06/14 0136 04/06/14 0425  BP: 123/59 113/45 146/58 114/51  Pulse: 81 79 85 80  Temp:   97.6 F (36.4 C) 98.8 F (37.1 C)  TempSrc:   Oral Oral  Resp:  20 18 18   Height:   5' 9"  (1.753 m)   Weight:   150 lb 8 oz (68.266 kg)   SpO2: 97% 95% 100% 97%   Weight change:   Intake/Output Summary (Last 24 hours) at 04/06/14 0850 Last data filed at 04/06/14 0018  Gross per 24 hour  Intake    340 ml  Output      0 ml  Net    340 ml   Physical Exam: General: alert, sitting up in bed, NAD HEENT: [place]/AT, EOMI, sclera anicteric, conjunctiva pale, tongue pale Neck: no JVD CV: RRR, 2/6 systolic murmur Pulm: CTAB, breaths non-labored Abd: BS+, soft, non-tender, non-distended Ext: warm, no edema, extremely dry, flaking skin on legs Neuro: A&O x 3, CNs II-XII intact, strength 5/5 in all extremities   Lab Results: Basic Metabolic Panel:  Recent Labs Lab 04/05/14 1950  NA 140  K 5.4*  CL 98  CO2 22  GLUCOSE 167*  BUN 69*  CREATININE 8.08*  CALCIUM 7.7*   Liver Function Tests:  Recent Labs Lab 04/05/14 1950  AST 18  ALT 15  ALKPHOS 72  BILITOT <0.2*  PROT 6.3  ALBUMIN 2.7*   CBC:  Recent Labs Lab 04/05/14 1950 04/06/14 0445  WBC 7.3 7.7  HGB 6.1* 6.1*  HCT 19.9* 19.5*  MCV 96.6 94.7  PLT 219 203   Anemia Panel:  Recent Labs Lab 04/06/14 0106  RETICCTPCT 4.4*   Urine Drug Screen: Drugs of Abuse     Component Value Date/Time   LABOPIA POSITIVE* 02/16/2014 0534   COCAINSCRNUR NONE DETECTED 02/16/2014 0534   LABBENZ NONE DETECTED 02/16/2014 0534   AMPHETMU NONE DETECTED 02/16/2014 0534   THCU NONE DETECTED 02/16/2014 0534   LABBARB NONE DETECTED 02/16/2014 0534    Micro Results: No results  found for this or any previous visit (from the past 240 hour(s)). Studies/Results: Dg Chest 2 View  04/05/2014   CLINICAL DATA:  Low hemoglobin with shortness of breath. History of dialysis. History of hypertension.  EXAM: CHEST  2 VIEW  COMPARISON:  02/28/2014  FINDINGS: Normal cardiomediastinal silhouette. Calcified calcified aortic knob. Slight vascular congestion without overt infiltrates or failure. No effusion or pneumothorax. Degenerative change with previous surgery RIGHT shoulder.  IMPRESSION: Mild vascular congestion.  Slight worsening compared with priors.   Electronically Signed   By: Rolla Flatten M.D.   On: 04/05/2014 20:41   Medications: I have reviewed the patient's current medications. Scheduled Meds: . sodium chloride   Intravenous Once  . calcium acetate  2,001 mg Oral TID WC  . levETIRAcetam  1,000 mg Oral BID   Continuous Infusions:  PRN Meds:. Assessment/Plan: Principal Problem:   Normocytic anemia Active Problems:   Hypertension   ESRD on hemodialysis   Seizure disorder   GI bleed   Orthostatic hypotension   Hyperkalemia   Anemia  Mr. Dobek is a 58yo man w/ PMHx of HTN, normocytic anemia requiring multiple transfusions, hx of GI bleed, diastolic CHF, seizure disorder, and ESRD on HD (  T/R/Sa) who presented with fatigue and lightheadedness secondary to anemia.   Symptomatic Anemia: Patient presents with lightheadedness and fatigue for last 4 days. Hbg found to be 6.1 in ED. FOBT positive. He denies any bleeding. Patient was transfused 2 units in the ED. He has a history of normocytic anemia, which has been worked up extensively and is likely multifactorial. Extensive workup in 2012 (in [place]) revealed no source of bleeding on EGD, capsule or colonoscopy in context of elevated LDH, normal haptoglobin, low retics, high ferritin, high TIBC, neg direct coombs, nonspecific SPEP; hematology suspected bone marrow process. Patient had a bone marrow biopsy in 2014 with Dr.  Juliann Mule (oncology) which showed minor B cell population (14% lymphocytes) c/f B cell lymphoproliferative process. Repeat bone marrow biopsy 3/15 showed atypical B cells worrisome for a follicular lymphoma or B cell lymphoproliferative process. Oncology believes his anemia to be most likely related to his ESRD.  Patient had another full GI workup in 08/2013 when he had a repeat capsule endoscopy which showed mild erythema and edema in the mid-small bowel, not felt to be cause of his anemia. His baseline Hbg 8.5-9.5 over last few months. Anemia likely multifactorial given his hx of ESRD, occult GI bleed (hematochezia, FOBT+), iron deficiency anemia, BM disorder, history of H.pylori, hx of use of NSAIDs, and he has been on and off of erythropoietin. Acute GI bleed less likely since recent capsule endoscopy normal and patient denies any bleeding. Most likely a result of his ESRD and BM disorder. Will repeat anemia studies. Iron 103, TIBC 237, ferritin 244 (H), haptoglobin pending, LDH 218, reticulocyte count 4.4 (H), peripheral smear marked polychromasia, folate pending, vitamin B12 pending. - f/u post-transfusion CBC (ordered for 6PM) - Transfuse additionally if Hbg < 7.0 - Needs outpatient f/u with heme/onc, Dr.Chism - Needs outpatient f/u with GI - Continue hectorol 3 and binders 3 phsolo ac  ESRD on HD (T/R/Sa): Patient reports he is up 10 kg from his baseline weight. Appears that his baseline weight is ~130 lbs (60 kg). He had an extended session of dialysis on Thursday. History of noncompliance with HD. - Appreciate renal consult - On schedule for dialysis today  Hyperkalemia: K 5.4 on admission-->4.5. Was given kayexelate x1. Last had HD on Thursday, which was an extended session (2 hours longer). Will likely continue to remain stable with dialysis. - Hold on further kayexelate  History of Hypertension: Mild vascular congestion on CXR. BP 107/53 on admission, now fluctuating 107/53 to 146/63. Patient  normally has systolic pressures in 407W. Previously took Hydralazine 100 mg BID on non-dialysis days, but was noncompliant and Dr. Mercy Moore stopped his amlodipine and labetolol medications 03/11/14.  - Hold Hydralazine for now   Seizure Disorder: He takes Keppra 1000 mg BID at home. - Continue home Keppra  Diet: Renal with 1200 ml fluid restriction; added feeding supplement  VTE PPx: SCDs; have reordered as they were not in place on exam  Dispo: Disposition is deferred at this time, awaiting improvement of current medical problems.  Anticipated discharge in approximately 1 day(s).   The patient does not have a current PCP Windy Kalata, MD) and does not need an [facility] hospital follow-up appointment after discharge.  The patient does not know have transportation limitations that hinder transportation to clinic appointments.  .Services Needed at time of discharge: Y = Yes, Blank = No PT:   OT:   RN:   Equipment:   Other:     LOS: 1 day  Drucilla Schmidt, MD 04/06/2014, 8:50 AM

## 2014-04-06 NOTE — ED Notes (Signed)
Attempted report 

## 2014-04-07 ENCOUNTER — Encounter (HOSPITAL_COMMUNITY): Payer: Self-pay | Admitting: Nephrology

## 2014-04-07 DIAGNOSIS — D649 Anemia, unspecified: Principal | ICD-10-CM

## 2014-04-07 LAB — CBC
HEMATOCRIT: 27.9 % — AB (ref 39.0–52.0)
Hemoglobin: 9.1 g/dL — ABNORMAL LOW (ref 13.0–17.0)
MCH: 29.9 pg (ref 26.0–34.0)
MCHC: 32.6 g/dL (ref 30.0–36.0)
MCV: 91.8 fL (ref 78.0–100.0)
Platelets: 193 10*3/uL (ref 150–400)
RBC: 3.04 MIL/uL — ABNORMAL LOW (ref 4.22–5.81)
RDW: 16.7 % — ABNORMAL HIGH (ref 11.5–15.5)
WBC: 7.6 10*3/uL (ref 4.0–10.5)

## 2014-04-07 LAB — TYPE AND SCREEN
ABO/RH(D): A POS
ANTIBODY SCREEN: NEGATIVE
UNIT DIVISION: 0
UNIT DIVISION: 0
Unit division: 0

## 2014-04-07 LAB — RENAL FUNCTION PANEL
Albumin: 2.8 g/dL — ABNORMAL LOW (ref 3.5–5.2)
Anion gap: 15 (ref 5–15)
BUN: 41 mg/dL — ABNORMAL HIGH (ref 6–23)
CALCIUM: 7.7 mg/dL — AB (ref 8.4–10.5)
CO2: 28 mEq/L (ref 19–32)
Chloride: 98 mEq/L (ref 96–112)
Creatinine, Ser: 5.86 mg/dL — ABNORMAL HIGH (ref 0.50–1.35)
GFR calc non Af Amer: 10 mL/min — ABNORMAL LOW (ref >90)
GFR, EST AFRICAN AMERICAN: 11 mL/min — AB (ref >90)
GLUCOSE: 79 mg/dL (ref 70–99)
PHOSPHORUS: 5.7 mg/dL — AB (ref 2.3–4.6)
Potassium: 3.8 mEq/L (ref 3.7–5.3)
SODIUM: 141 meq/L (ref 137–147)

## 2014-04-07 MED ORDER — DARBEPOETIN ALFA 200 MCG/0.4ML IJ SOSY: 200.0000 ug | PREFILLED_SYRINGE | INTRAMUSCULAR | Status: DC

## 2014-04-07 MED ORDER — DOXERCALCIFEROL 4 MCG/2ML IV SOLN: 3.0000 ug | INTRAVENOUS | Status: DC

## 2014-04-07 MED ORDER — NEPRO/CARBSTEADY PO LIQD: 237.0000 mL | Freq: Two times a day (BID) | ORAL | Status: DC

## 2014-04-07 NOTE — Progress Notes (Signed)
Subjective:   Day of hospitalization: 2  VSS.  No overnight events.  Pt is resting comfortably and feeling better this AM but still reports having some dizziness which he says has been ongoing for several months.  He received 3 units of prbcs with hgb now  9.1 (on admission was 6.1).  No s/s of bleeding.  Denies CP, SOB.      Objective:   Vital signs in last 24 hours: Filed Vitals:   04/07/14 1051 04/07/14 1100 04/07/14 1130 04/07/14 1200  BP: 129/75 178/81 192/85 168/91  Pulse: 78 73 73 73  Temp:      TempSrc:      Resp:      Height:      Weight:      SpO2:        Weight: Filed Weights   04/06/14 1449 04/06/14 1800 04/07/14 1032  Weight: 147 lb 11.3 oz (67 kg) 144 lb 13.5 oz (65.7 kg) 146 lb 6.2 oz (66.4 kg)    I/Os:  Intake/Output Summary (Last 24 hours) at 04/07/14 1216 Last data filed at 04/07/14 0600  Gross per 24 hour  Intake    910 ml  Output   2480 ml  Net  -1570 ml    Physical Exam: Constitutional: Vital signs reviewed.  Patient is sitting up in bed in no acute distress and cooperative with exam.   HEENT: [place]/AT; PERRL, EOMI, conjunctivae normal, no scleral icterus  Cardiovascular: RRR, no MRG Pulmonary/Chest: normal respiratory effort, no accessory muscle use, CTAB, no wheezes, rales, or rhonchi Abdominal: Soft. +BS, NT/ND Neurological: A&O x3, CN II-XII grossly intact; non-focal exam Extremities: 2+DP b/l, no C/C/E  Skin: Warm, dry and intact. Xerosis.  Lab Results:  BMP:  Recent Labs Lab 04/06/14 0920 04/07/14 0503  NA 144 141  K 4.5 3.8  CL 102 98  CO2 24 28  GLUCOSE 88 79  BUN 76* 41*  CREATININE 9.45* 5.86*  CALCIUM 7.3* 7.7*  PHOS  --  5.7*   CBC:  Recent Labs Lab 04/06/14 2108 04/07/14 0503  WBC 8.2 7.6  HGB 8.9* 9.1*  HCT 27.5* 27.9*  MCV 90.8 91.8  PLT 207 193    Coagulation: No results for input(s): LABPROT, INR in the last 168 hours.  CBG:           No results for input(s): GLUCAP in the last 168  hours.         HA1C:      No results for input(s): HGBA1C in the last 168 hours.  Lipid Panel: No results for input(s): CHOL, HDL, LDLCALC, TRIG, CHOLHDL, LDLDIRECT in the last 168 hours.  LFTs:  Recent Labs Lab 04/05/14 1950 04/07/14 0503  AST 18  --   ALT 15  --   ALKPHOS 72  --   BILITOT <0.2*  --   PROT 6.3  --   ALBUMIN 2.7* 2.8*    Pancreatic Enzymes: No results for input(s): LIPASE, AMYLASE in the last 168 hours.  Lactic Acid/Procalcitonin: No results for input(s): LATICACIDVEN, PROCALCITON in the last 168 hours.  Ammonia: No results for input(s): AMMONIA in the last 168 hours.  Cardiac Enzymes: No results for input(s): CKTOTAL, CKMB, CKMBINDEX, TROPONINI in the last 168 hours.  EKG: EKG Interpretation  Date/Time:  Friday April 05 2014 19:03:47 EST Ventricular Rate:  94 PR Interval:  152 QRS Duration: 96 QT Interval:  384 QTC Calculation: 480 R Axis:   61 Text Interpretation:  Normal sinus rhythm  Left ventricular hypertrophy with repolarization abnormality Non-specific ST-t changes Prolonged QT Confirmed by Ashok Cordia  MD, Lennette Bihari ([id number]) on 04/05/2014 7:16:10 PM   BNP: No results for input(s): PROBNP in the last 168 hours.  D-Dimer: No results for input(s): DDIMER in the last 168 hours.  Urinalysis: No results for input(s): COLORURINE, LABSPEC, PHURINE, GLUCOSEU, HGBUR, BILIRUBINUR, KETONESUR, PROTEINUR, UROBILINOGEN, NITRITE, LEUKOCYTESUR in the last 168 hours.  Invalid input(s): APPERANCEUR  Micro Results: No results found for this or any previous visit (from the past 240 hour(s)).  Blood Culture:    Component Value Date/Time   SDES BLOOD RIGHT ARM 01/19/2014 1937   SPECREQUEST BOTTLES DRAWN AEROBIC AND ANAEROBIC 10 CC 01/19/2014 1937   CULT  01/19/2014 1937    NO GROWTH 5 DAYS Performed at [facility] 01/26/2014 FINAL 01/19/2014 1937    Studies/Results: Dg Chest 2 View  04/05/2014   CLINICAL DATA:  Low hemoglobin  with shortness of breath. History of dialysis. History of hypertension.  EXAM: CHEST  2 VIEW  COMPARISON:  02/28/2014  FINDINGS: Normal cardiomediastinal silhouette. Calcified calcified aortic knob. Slight vascular congestion without overt infiltrates or failure. No effusion or pneumothorax. Degenerative change with previous surgery RIGHT shoulder.  IMPRESSION: Mild vascular congestion.  Slight worsening compared with priors.   Electronically Signed   By: Rolla Flatten M.D.   On: 04/05/2014 20:41    Medications:  Scheduled Meds: . sodium chloride   Intravenous Once  . calcium acetate  2,001 mg Oral TID WC  . [START ON 04/09/2014] darbepoetin (ARANESP) injection - DIALYSIS  200 mcg Intravenous Q Tue-HD  . doxercalciferol  3 mcg Intravenous Q T,Th,Sa-HD  . feeding supplement (NEPRO CARB STEADY)  237 mL Oral BID BM  . levETIRAcetam  1,000 mg Oral BID  . multivitamin  1 tablet Oral QHS   Continuous Infusions:  PRN Meds:   Antibiotics: Antibiotics Given (last 72 hours)    None      Day of Hospitalization: 2  Consults: Treatment Team:  Sol Blazing, MD  Assessment/Plan:   Principal Problem:   Normocytic anemia Active Problems:   Hypertension   ESRD on hemodialysis   Seizure disorder   GI bleed   Orthostatic hypotension   Hyperkalemia   Anemia   ESRD on dialysis   Heme positive stool   Symptomatic anemia  Acute on chronic anemia  Resolved; s/p 3 units of prbcs, hgb back up to 9.1 today from 6.1 on admission. -pt should f/u with heme/onc Dr.Chism -will recheck orthostatics this AM   ESRD HD on TTS.  Received HD yesterday.  Does not appear volume overloaded.   -continue HD TTS as outpatient  -HD today??  HTN -resume hydralazine home dose   Seizure Disorder -continue home keppra  Disposition Anticipated discharge today.    LOS: 2 days   Jones Bales, MD PGY-2, Internal Medicine Teaching Service 04/07/2014, 12:16 PM

## 2014-04-07 NOTE — Progress Notes (Signed)
Patient Discharge: Disposition: Patient discharged, homeless, social worker to arrange transportation. Education: Patient educated about medications, follow-up appointment, and discharge instruction. IV: Discontinued IV before discharge. Transportation: [name] transported in w/c with staff accompanying. Belongings: Patient took all his belongings with him.

## 2014-04-07 NOTE — Discharge Summary (Signed)
Name: [name] MRN: [id number] DOB: 12/20/1955 58 y.o. PCP: Windy Kalata, MD ______________________________________________________________  Date of Admission: 04/05/2014  7:03 PM Date of Discharge: 04/07/2014 Attending Physician: Sid Falcon, MD   Discharge Diagnosis: Normocytic anemia  Other Chronic Problems: ESRD Seizure disorder Hypertension  Discharge Medications:   Medication List    TAKE these medications        calcium acetate 667 MG capsule  Commonly known as:  PHOSLO  Take 2,001 mg by mouth 3 (three) times daily with meals.     Darbepoetin Alfa 200 MCG/0.4ML Sosy injection  Commonly known as:  ARANESP  Inject 0.4 mLs (200 mcg total) into the vein every Tuesday with hemodialysis.  Start taking on:  04/09/2014     doxercalciferol 4 MCG/2ML injection  Commonly known as:  HECTOROL  Inject 1.5 mLs (3 mcg total) into the vein Every Tuesday,Thursday,and Saturday with dialysis.     feeding supplement (NEPRO CARB STEADY) Liqd  Take 237 mLs by mouth 2 (two) times daily between meals.     hydrALAZINE 100 MG tablet  Commonly known as:  APRESOLINE  Take 100 mg by mouth 2 (two) times daily. DON'T TAKE ON DIALYSIS DAYS     levETIRAcetam 1000 MG tablet  Commonly known as:  KEPPRA  Take 1,000 mg by mouth 2 (two) times daily.        Disposition and follow-up:   RandallPlacido [name] was discharged from [facility] in Stable condition to home.  Please address the following problems post-discharge:  1.[name] presented with symptomatic normocytic anemia and a hemoglobin of 6.1; please monitor. Multiple GI workups in last 2 years have not found a source of bleeding.  2. Bone marrow process is being investigated by oncology as an outpatient (with Dr. Juliann Mule); was not worked up here.  Labs / imaging needed at time of follow-up: CBC  Pending labs/ test needing follow-up: none  Follow-up Appointments: Follow-up Information    Follow  up with [facility].   Specialty:  Internal Medicine   Contact information:   [address] [place] [address] [phone]       Discharge Instructions: Discharge Instructions    Call MD for:  difficulty breathing, headache or visual disturbances    Complete by:  As directed      Call MD for:  persistant dizziness or light-headedness    Complete by:  As directed      Diet - low sodium heart healthy    Complete by:  As directed      Discharge instructions    Complete by:  As directed   Please continue to attend your dialysis sessions and take all medications as prescribed.     Increase activity slowly    Complete by:  As directed            Consultations: Treatment Team:  Sol Blazing, MD  Procedures Performed:  Dg Chest 2 View  04/05/2014   CLINICAL DATA:  Low hemoglobin with shortness of breath. History of dialysis. History of hypertension.  EXAM: CHEST  2 VIEW  COMPARISON:  02/28/2014  FINDINGS: Normal cardiomediastinal silhouette. Calcified calcified aortic knob. Slight vascular congestion without overt infiltrates or failure. No effusion or pneumothorax. Degenerative change with previous surgery RIGHT shoulder.  IMPRESSION: Mild vascular congestion.  Slight worsening compared with priors.   Electronically Signed   By: Rolla Flatten M.D.   On: 04/05/2014 20:41   Admission HPI:  [name]  is a 58yo man w/ PMHx of HTN, normocytic anemia requiring multiple transfusions, hx of GI bleed, diastolic CHF, seizure disorder, and ESRD on HD (TThS) who presents to the ED with fatigue and lightheadedness. Patient reports he went to HD on Tuesday and was told that his hemoglobin was low and that he should go to the ED. He states he waited to come to the ED until today. Patient notes he has become progressively more lightheaded, fatigued, and short of breath when walking. He reports he went to HD yesterday and that his session was an extra 2 hours longer because his weight was  up 10 kg. He denies any blood loss including melena, hematochezia, hemoptysis, hematemesis, and hematuria. He also denies any chest pain, abdominal pain, nausea, vomiting.   In the ED, he was found to have a positive orthostatics, hemoglobin of 6.1, and FOBT positive. He was transfused 2 units of PRBCs.   Hospital Course by problem list: [name] is a 58yo man w/ PMHx of HTN, normocytic anemia requiring multiple transfusions, hx of GI bleed, diastolic CHF, seizure disorder, and ESRD on HD who presented with fatigue and lightheadedness secondary to anemia (found to have a hemoglobin of 6.1).  Symptomatic Normocytic Anemia: Patient presented with lightheadedness and fatigue for last 4 days. Hbg found to be 6.1 in ED. FOBT positive. He denies any bleeding. Patient was transfused 2 units PRBCs in the ED. He has a history of normocytic anemia, which has been worked up extensively and is likely multifactorial. Extensive workup in 2012 (in [place]) revealed no source of bleeding on EGD, capsule or colonoscopy in context of elevated LDH, normal haptoglobin, low retics, high ferritin, high TIBC, neg direct coombs, nonspecific SPEP; hematology suspected bone marrow process. Patient had a bone marrow biopsy in 2014 with Dr. Juliann Mule (oncology) which showed minor B cell population (14% lymphocytes) c/f B cell lymphoproliferative process. Repeat bone marrow biopsy 3/15 showed atypical B cells worrisome for a follicular lymphoma or B cell lymphoproliferative process. Oncology believes his anemia to be most likely related to his ESRD.  Patient had another full GI workup in 08/2013 when he had a repeat capsule endoscopy which showed mild erythema and edema in the mid-small bowel, not felt to be cause of his anemia. His baseline Hbg 8.5-9.5 over last few months. Anemia likely multifactorial given his hx of ESRD, occult GI bleed (hematochezia, FOBT+), iron deficiency anemia, BM disorder, history of H.pylori, hx of use of  NSAIDs, and he has been on and off of erythropoietin. Acute GI bleed less likely since recent capsule endoscopy normal and patient denies any bleeding. Most likely a result of his ESRD and BM disorder. On this admission: Iron 103, TIBC 237, ferritin 244 (H), haptoglobin 82, LDH 218, reticulocyte count 4.4 (H), peripheral smear marked polychromasia, folate 11.8, vitamin B12 557.  ESRD on HD (T/R/Sa): Patient reported he is up 10 kg from his baseline weight. Appeared that his baseline weight is ~130 lbs (60 kg). He had an extended session of dialysis day prior to admission. History of noncompliance with HD. Received Saturday session in the hospital.  Hyperkalemia: K 5.4 on admission normalized with kayexelate x1 and hemodialysis -->3.8.   Discharge Vitals:   BP 168/91 mmHg  Pulse 73  Temp(Src) 98.7 F (37.1 C) (Oral)  Resp 16  Ht 5' 9" (1.753 m)  Wt 146 lb 6.2 oz (66.4 kg)  BMI 21.61 kg/m2  SpO2 99%  Discharge Labs:  Results for orders placed or performed  during the hospital encounter of 04/05/14 (from the past 24 hour(s))  CBC     Status: Abnormal   Collection Time: 04/06/14  9:08 PM  Result Value Ref Range   WBC 8.2 4.0 - 10.5 K/uL   RBC 3.03 (L) 4.22 - 5.81 MIL/uL   Hemoglobin 8.9 (L) 13.0 - 17.0 g/dL   HCT 27.5 (L) 39.0 - 52.0 %   MCV 90.8 78.0 - 100.0 fL   MCH 29.4 26.0 - 34.0 pg   MCHC 32.4 30.0 - 36.0 g/dL   RDW 16.0 (H) 11.5 - 15.5 %   Platelets 207 150 - 400 K/uL  Renal function panel     Status: Abnormal   Collection Time: 04/07/14  5:03 AM  Result Value Ref Range   Sodium 141 137 - 147 mEq/L   Potassium 3.8 3.7 - 5.3 mEq/L   Chloride 98 96 - 112 mEq/L   CO2 28 19 - 32 mEq/L   Glucose, Bld 79 70 - 99 mg/dL   BUN 41 (H) 6 - 23 mg/dL   Creatinine, Ser 5.86 (H) 0.50 - 1.35 mg/dL   Calcium 7.7 (L) 8.4 - 10.5 mg/dL   Phosphorus 5.7 (H) 2.3 - 4.6 mg/dL   Albumin 2.8 (L) 3.5 - 5.2 g/dL   GFR calc non Af Amer 10 (L) >90 mL/min   GFR calc Af Amer 11 (L) >90 mL/min   Anion  gap 15 5 - 15  CBC     Status: Abnormal   Collection Time: 04/07/14  5:03 AM  Result Value Ref Range   WBC 7.6 4.0 - 10.5 K/uL   RBC 3.04 (L) 4.22 - 5.81 MIL/uL   Hemoglobin 9.1 (L) 13.0 - 17.0 g/dL   HCT 27.9 (L) 39.0 - 52.0 %   MCV 91.8 78.0 - 100.0 fL   MCH 29.9 26.0 - 34.0 pg   MCHC 32.6 30.0 - 36.0 g/dL   RDW 16.7 (H) 11.5 - 15.5 %   Platelets 193 150 - 400 K/uL    Signed: Jones Bales, MD 04/07/2014, 12:25 PM   Services Ordered on Discharge: None Equipment Ordered on Discharge: None

## 2014-04-07 NOTE — Progress Notes (Signed)
Social worker supplied patient with taxi voucher. Patient was discharged.

## 2014-04-07 NOTE — Progress Notes (Signed)
Orthostatic vital signs:   Sitting BP:  131/59  HR: 80  R 18  Lying BP: 137/61  HR 84  R 19  Standing:  115/55  HR:  79  R 18

## 2014-04-07 NOTE — Progress Notes (Addendum)
[facility] Progress Note  Assessment/Plan: 1. Symtomatic anemia with + FOBT - Hgb up to 9.1 stable ;  Adm Hgb 6.1 from 7.2 12/10 - s/p 1 unit in ED - plusanother 2 units on HD Sat; Hgb drop consistant with slow GI blood loss though could have a component of ESA deficiency given history of administration; continue weekly ESA - 45% sat Fe 94 ferritin 514 - surprisingly high; previous work-up well summarized by Internal Medicine; follow with weekly hgb at outpt HD 2. ESRD - TTS - HD Sat with recent intervention for declot as per above; chronicallynot running full treatments as outpt and not getting to edw usual post weight ~ 65 kg; will use 16 gauge and qb 300 today due to access discomfort yesterday and tmt today is for UF primarily 3. Hypertension/volume - BP 170 - 190 ;outpt highly variable pre and post HD - CXR on adm mild vasc congestion; Dr. Mercy Moore stopped BP meds 11/23; BP control via volume removal- only getting to within 5 kg of EDW due to high goals, may need to titrate down with extra treatment - net UF 2.48 Saturday with post weight of 65.7; favor extra treatment here today to titrate volume down closer to edw and then possible discharge - he is agreeable to 3 hr tmt 4. Metabolic bone disease - Continue Hectorol 3, continue binders 3 phsolo ac 5. Nutrition - add supplements to augment kcal 6. Substance abuse 7. Sz disorder - on keppra 8. Medical and dialysis noncompliance - ongoing complicating issue 9. Homelessness - resides at [facility] 10. Disp - d/c today or tomorrow  Myriam Jacobson, PA-C [place] [phone] 04/07/2014,9:13 AM  LOS: 2 days   Pt seen, examined and agree w A/P as above. Still vol overloaded, plan extra HD today for volume. OK for dc after HD from our standpoint it stable.  Kelly Splinter MD pager [phone]    cell [phone] 04/07/2014, 11:16 AM    Subjective:   Agrees to extra tmt today - said he had pain  access at the end of tmt yesterday  Objective Filed Vitals:   04/06/14 1800 04/06/14 1949 04/06/14 2127 04/07/14 0448  BP: 193/93 174/60 178/69 180/74  Pulse: 79 79 81 76  Temp: 98.6 F (37 C)  98.8 F (37.1 C) 98.8 F (37.1 C)  TempSrc: Oral  Oral Oral  Resp: 18  18 16   Height:      Weight: 65.7 kg (144 lb 13.5 oz)     SpO2: 100%  97% 99%   Physical Exam General: NAD sleeping Heart: RRR Lungs: no rales Abdomen: soft NT Extremities: no edema Dialysis Access: left AVF patent retention sutures in place  Dialysis Orders: TTS @ AF 180 4 hours 60kgs 2K/2.25 400/1.5 No Heparin L AVF Hectorol 3 Venofer none Aranesp 200 last given 12 /15 - prior to that was on Aranesp 501 2/8 and 11/23 Recent labs: Hgb 9 11/19, 8.4 11/23, 7.2 12/10 6.3 12/17 32% sat 11/23  Other: recent delot: 12/18 PTA 53% outflow baslic vein stenosis and 61% inflow basilic vein  Additional Objective Labs: Basic Metabolic Panel:  Recent Labs Lab 04/05/14 1950 04/06/14 0920 04/07/14 0503  NA 140 144 141  K 5.4* 4.5 3.8  CL 98 102 98  CO2 22 24 28   GLUCOSE 167* 88 79  BUN 69* 76* 41*  CREATININE 8.08* 9.45* 5.86*  CALCIUM 7.7* 7.3* 7.7*  PHOS  --   --  5.7*  Liver Function Tests:  Recent Labs Lab 04/05/14 1950 04/07/14 0503  AST 18  --   ALT 15  --   ALKPHOS 72  --   BILITOT <0.2*  --   PROT 6.3  --   ALBUMIN 2.7* 2.8*   CBC:  Recent Labs Lab 04/05/14 1950 04/06/14 0445 04/06/14 2108 04/07/14 0503  WBC 7.3 7.7 8.2 7.6  HGB 6.1* 6.1* 8.9* 9.1*  HCT 19.9* 19.5* 27.5* 27.9*  MCV 96.6 94.7 90.8 91.8  PLT 219 203 207 193  Iron Studies:  Recent Labs  04/06/14 0106  IRON 94  TIBC 210*  FERRITIN 514*    Studies/Results: Dg Chest 2 View  04/05/2014   CLINICAL DATA:  Low hemoglobin with shortness of breath. History of dialysis. History of hypertension.  EXAM: CHEST  2 VIEW  COMPARISON:  02/28/2014  FINDINGS: Normal cardiomediastinal silhouette. Calcified calcified  aortic knob. Slight vascular congestion without overt infiltrates or failure. No effusion or pneumothorax. Degenerative change with previous surgery RIGHT shoulder.  IMPRESSION: Mild vascular congestion.  Slight worsening compared with priors.   Electronically Signed   By: Rolla Flatten M.D.   On: 04/05/2014 20:41   Medications:   . sodium chloride   Intravenous Once  . calcium acetate  2,001 mg Oral TID WC  . [START ON 04/09/2014] darbepoetin (ARANESP) injection - DIALYSIS  200 mcg Intravenous Q Tue-HD  . doxercalciferol  3 mcg Intravenous Q T,Th,Sa-HD  . feeding supplement (NEPRO CARB STEADY)  237 mL Oral BID BM  . levETIRAcetam  1,000 mg Oral BID  . multivitamin  1 tablet Oral QHS

## 2014-04-07 NOTE — Progress Notes (Signed)
  Date: 04/07/2014  Patient name: [name]  Medical record number: [id number]  Date of birth: 1956-03-10   This patient's plan of care was discussed with the house staff. Please see their note for complete details. I concur with their findings.  [name] has a long and complicated history of anemia with no clear source of bleeding and which is likely multifactorial.  His H/H are stable.  If he is no longer orthostatic and can tolerate walking he may be able to be discharged today.  If orthostatic hypotension persistent, he may benefit from evaluation for mineralocorticoid deficiency, but this does not need to be done as an inpatient.     Sid Falcon, MD 04/07/2014, 12:24 PM

## 2014-04-08 LAB — HAPTOGLOBIN: Haptoglobin: 82 mg/dL (ref 45–215)

## 2014-04-20 ENCOUNTER — Encounter (HOSPITAL_COMMUNITY): Payer: Self-pay | Admitting: Emergency Medicine

## 2014-04-20 DIAGNOSIS — F17210 Nicotine dependence, cigarettes, uncomplicated: Secondary | ICD-10-CM | POA: Diagnosis present

## 2014-04-20 DIAGNOSIS — I5032 Chronic diastolic (congestive) heart failure: Secondary | ICD-10-CM | POA: Diagnosis present

## 2014-04-20 DIAGNOSIS — Z96652 Presence of left artificial knee joint: Secondary | ICD-10-CM | POA: Diagnosis present

## 2014-04-20 DIAGNOSIS — J189 Pneumonia, unspecified organism: Secondary | ICD-10-CM | POA: Diagnosis present

## 2014-04-20 DIAGNOSIS — E875 Hyperkalemia: Secondary | ICD-10-CM | POA: Diagnosis present

## 2014-04-20 DIAGNOSIS — E611 Iron deficiency: Secondary | ICD-10-CM | POA: Diagnosis present

## 2014-04-20 DIAGNOSIS — A419 Sepsis, unspecified organism: Secondary | ICD-10-CM | POA: Diagnosis not present

## 2014-04-20 DIAGNOSIS — Y95 Nosocomial condition: Secondary | ICD-10-CM | POA: Diagnosis present

## 2014-04-20 DIAGNOSIS — E119 Type 2 diabetes mellitus without complications: Secondary | ICD-10-CM | POA: Diagnosis present

## 2014-04-20 DIAGNOSIS — E861 Hypovolemia: Secondary | ICD-10-CM | POA: Diagnosis present

## 2014-04-20 DIAGNOSIS — E871 Hypo-osmolality and hyponatremia: Secondary | ICD-10-CM | POA: Diagnosis present

## 2014-04-20 DIAGNOSIS — Z590 Homelessness: Secondary | ICD-10-CM

## 2014-04-20 DIAGNOSIS — Z992 Dependence on renal dialysis: Secondary | ICD-10-CM

## 2014-04-20 DIAGNOSIS — R569 Unspecified convulsions: Secondary | ICD-10-CM | POA: Diagnosis present

## 2014-04-20 DIAGNOSIS — Z9114 Patient's other noncompliance with medication regimen: Secondary | ICD-10-CM | POA: Diagnosis present

## 2014-04-20 DIAGNOSIS — Z87820 Personal history of traumatic brain injury: Secondary | ICD-10-CM

## 2014-04-20 DIAGNOSIS — Z5320 Procedure and treatment not carried out because of patient's decision for unspecified reasons: Secondary | ICD-10-CM | POA: Diagnosis present

## 2014-04-20 DIAGNOSIS — N186 End stage renal disease: Secondary | ICD-10-CM | POA: Diagnosis present

## 2014-04-20 DIAGNOSIS — Z9119 Patient's noncompliance with other medical treatment and regimen: Secondary | ICD-10-CM | POA: Diagnosis present

## 2014-04-20 DIAGNOSIS — I132 Hypertensive heart and chronic kidney disease with heart failure and with stage 5 chronic kidney disease, or end stage renal disease: Secondary | ICD-10-CM | POA: Diagnosis present

## 2014-04-20 DIAGNOSIS — R05 Cough: Secondary | ICD-10-CM | POA: Diagnosis not present

## 2014-04-20 DIAGNOSIS — D638 Anemia in other chronic diseases classified elsewhere: Secondary | ICD-10-CM | POA: Diagnosis present

## 2014-04-20 LAB — CBC WITH DIFFERENTIAL/PLATELET
BASOS ABS: 0 10*3/uL (ref 0.0–0.1)
BASOS PCT: 0 % (ref 0–1)
EOS ABS: 0 10*3/uL (ref 0.0–0.7)
Eosinophils Relative: 0 % (ref 0–5)
HEMATOCRIT: 23.7 % — AB (ref 39.0–52.0)
HEMOGLOBIN: 7.7 g/dL — AB (ref 13.0–17.0)
Lymphocytes Relative: 6 % — ABNORMAL LOW (ref 12–46)
Lymphs Abs: 0.7 10*3/uL (ref 0.7–4.0)
MCH: 29.5 pg (ref 26.0–34.0)
MCHC: 32.5 g/dL (ref 30.0–36.0)
MCV: 90.8 fL (ref 78.0–100.0)
MONO ABS: 0.5 10*3/uL (ref 0.1–1.0)
Monocytes Relative: 5 % (ref 3–12)
Neutro Abs: 9.4 10*3/uL — ABNORMAL HIGH (ref 1.7–7.7)
Neutrophils Relative %: 89 % — ABNORMAL HIGH (ref 43–77)
Platelets: 193 10*3/uL (ref 150–400)
RBC: 2.61 MIL/uL — ABNORMAL LOW (ref 4.22–5.81)
RDW: 15.2 % (ref 11.5–15.5)
WBC: 10.6 10*3/uL — ABNORMAL HIGH (ref 4.0–10.5)

## 2014-04-20 LAB — COMPREHENSIVE METABOLIC PANEL
ALBUMIN: 2.7 g/dL — AB (ref 3.5–5.2)
ALT: 22 U/L (ref 0–53)
AST: 31 U/L (ref 0–37)
Alkaline Phosphatase: 61 U/L (ref 39–117)
Anion gap: 24 — ABNORMAL HIGH (ref 5–15)
BILIRUBIN TOTAL: 0.7 mg/dL (ref 0.3–1.2)
BUN: 155 mg/dL — ABNORMAL HIGH (ref 6–23)
CHLORIDE: 94 meq/L — AB (ref 96–112)
CO2: 17 mmol/L — ABNORMAL LOW (ref 19–32)
Calcium: 5.6 mg/dL — CL (ref 8.4–10.5)
Creatinine, Ser: 13.27 mg/dL — ABNORMAL HIGH (ref 0.50–1.35)
GFR calc Af Amer: 4 mL/min — ABNORMAL LOW (ref >90)
GFR, EST NON AFRICAN AMERICAN: 4 mL/min — AB (ref >90)
Glucose, Bld: 102 mg/dL — ABNORMAL HIGH (ref 70–99)
Potassium: 6.8 mmol/L (ref 3.5–5.1)
SODIUM: 135 mmol/L (ref 135–145)
Total Protein: 6.6 g/dL (ref 6.0–8.3)

## 2014-04-20 NOTE — ED Notes (Signed)
Pt. reports productive cough for 3 days with mild SOB and chest congestion , no fever or chills. Hemodialysis q Tues/Thurs/Sat . Last hemodialysis Thursday .

## 2014-04-21 ENCOUNTER — Inpatient Hospital Stay (HOSPITAL_COMMUNITY)
Admission: EM | Admit: 2014-04-21 | Discharge: 2014-04-25 | DRG: 871 | Disposition: A | Discharge status: Skilled Nursing Facility | Payer: Medicare Other | Attending: Oncology | Admitting: Oncology

## 2014-04-21 ENCOUNTER — Emergency Department (HOSPITAL_COMMUNITY): Payer: Medicare Other

## 2014-04-21 DIAGNOSIS — A419 Sepsis, unspecified organism: Principal | ICD-10-CM

## 2014-04-21 DIAGNOSIS — Z992 Dependence on renal dialysis: Secondary | ICD-10-CM

## 2014-04-21 DIAGNOSIS — G40909 Epilepsy, unspecified, not intractable, without status epilepticus: Secondary | ICD-10-CM

## 2014-04-21 DIAGNOSIS — Y95 Nosocomial condition: Secondary | ICD-10-CM | POA: Diagnosis present

## 2014-04-21 DIAGNOSIS — R7989 Other specified abnormal findings of blood chemistry: Secondary | ICD-10-CM | POA: Diagnosis present

## 2014-04-21 DIAGNOSIS — J189 Pneumonia, unspecified organism: Secondary | ICD-10-CM

## 2014-04-21 DIAGNOSIS — R569 Unspecified convulsions: Secondary | ICD-10-CM

## 2014-04-21 DIAGNOSIS — N186 End stage renal disease: Secondary | ICD-10-CM | POA: Diagnosis present

## 2014-04-21 DIAGNOSIS — D508 Other iron deficiency anemias: Secondary | ICD-10-CM

## 2014-04-21 DIAGNOSIS — E875 Hyperkalemia: Secondary | ICD-10-CM | POA: Diagnosis present

## 2014-04-21 DIAGNOSIS — R079 Chest pain, unspecified: Secondary | ICD-10-CM | POA: Diagnosis present

## 2014-04-21 DIAGNOSIS — Z9119 Patient's noncompliance with other medical treatment and regimen: Secondary | ICD-10-CM | POA: Diagnosis present

## 2014-04-21 DIAGNOSIS — Z9114 Patient's other noncompliance with medication regimen: Secondary | ICD-10-CM | POA: Diagnosis present

## 2014-04-21 DIAGNOSIS — Z5320 Procedure and treatment not carried out because of patient's decision for unspecified reasons: Secondary | ICD-10-CM | POA: Diagnosis present

## 2014-04-21 DIAGNOSIS — E46 Unspecified protein-calorie malnutrition: Secondary | ICD-10-CM

## 2014-04-21 DIAGNOSIS — E119 Type 2 diabetes mellitus without complications: Secondary | ICD-10-CM | POA: Diagnosis present

## 2014-04-21 DIAGNOSIS — E611 Iron deficiency: Secondary | ICD-10-CM | POA: Diagnosis present

## 2014-04-21 DIAGNOSIS — Z720 Tobacco use: Secondary | ICD-10-CM | POA: Diagnosis present

## 2014-04-21 DIAGNOSIS — R778 Other specified abnormalities of plasma proteins: Secondary | ICD-10-CM | POA: Diagnosis present

## 2014-04-21 DIAGNOSIS — I10 Essential (primary) hypertension: Secondary | ICD-10-CM

## 2014-04-21 DIAGNOSIS — Z87820 Personal history of traumatic brain injury: Secondary | ICD-10-CM | POA: Diagnosis not present

## 2014-04-21 DIAGNOSIS — Z590 Homelessness: Secondary | ICD-10-CM | POA: Diagnosis not present

## 2014-04-21 DIAGNOSIS — I5032 Chronic diastolic (congestive) heart failure: Secondary | ICD-10-CM | POA: Diagnosis present

## 2014-04-21 DIAGNOSIS — Z96652 Presence of left artificial knee joint: Secondary | ICD-10-CM | POA: Diagnosis present

## 2014-04-21 DIAGNOSIS — E861 Hypovolemia: Secondary | ICD-10-CM | POA: Diagnosis present

## 2014-04-21 DIAGNOSIS — R05 Cough: Secondary | ICD-10-CM | POA: Diagnosis present

## 2014-04-21 DIAGNOSIS — F17210 Nicotine dependence, cigarettes, uncomplicated: Secondary | ICD-10-CM | POA: Diagnosis present

## 2014-04-21 DIAGNOSIS — R0602 Shortness of breath: Secondary | ICD-10-CM

## 2014-04-21 DIAGNOSIS — I132 Hypertensive heart and chronic kidney disease with heart failure and with stage 5 chronic kidney disease, or end stage renal disease: Secondary | ICD-10-CM | POA: Diagnosis present

## 2014-04-21 DIAGNOSIS — E871 Hypo-osmolality and hyponatremia: Secondary | ICD-10-CM | POA: Diagnosis present

## 2014-04-21 DIAGNOSIS — D649 Anemia, unspecified: Secondary | ICD-10-CM

## 2014-04-21 DIAGNOSIS — D638 Anemia in other chronic diseases classified elsewhere: Secondary | ICD-10-CM | POA: Diagnosis present

## 2014-04-21 LAB — CBC WITH DIFFERENTIAL/PLATELET
Basophils Absolute: 0 10*3/uL (ref 0.0–0.1)
Basophils Relative: 0 % (ref 0–1)
EOS PCT: 0 % (ref 0–5)
Eosinophils Absolute: 0 10*3/uL (ref 0.0–0.7)
HCT: 20.2 % — ABNORMAL LOW (ref 39.0–52.0)
Hemoglobin: 6.6 g/dL — CL (ref 13.0–17.0)
LYMPHS PCT: 7 % — AB (ref 12–46)
Lymphs Abs: 0.6 10*3/uL — ABNORMAL LOW (ref 0.7–4.0)
MCH: 29.3 pg (ref 26.0–34.0)
MCHC: 32.7 g/dL (ref 30.0–36.0)
MCV: 89.8 fL (ref 78.0–100.0)
Monocytes Absolute: 0.5 10*3/uL (ref 0.1–1.0)
Monocytes Relative: 6 % (ref 3–12)
Neutro Abs: 7.5 10*3/uL (ref 1.7–7.7)
Neutrophils Relative %: 88 % — ABNORMAL HIGH (ref 43–77)
PLATELETS: 166 10*3/uL (ref 150–400)
RBC: 2.25 MIL/uL — AB (ref 4.22–5.81)
RDW: 15.3 % (ref 11.5–15.5)
WBC: 8.5 10*3/uL (ref 4.0–10.5)

## 2014-04-21 LAB — LIPID PANEL
Cholesterol: 130 mg/dL (ref 0–200)
HDL: 33 mg/dL — AB (ref >39)
LDL Cholesterol: 78 mg/dL (ref 0–99)
Total CHOL/HDL Ratio: 3.9 RATIO
Triglycerides: 93 mg/dL (ref <150)
VLDL: 19 mg/dL (ref 0–40)

## 2014-04-21 LAB — PREPARE RBC (CROSSMATCH)

## 2014-04-21 LAB — BASIC METABOLIC PANEL
ANION GAP: 29 — AB (ref 5–15)
BUN: 160 mg/dL — ABNORMAL HIGH (ref 6–23)
CHLORIDE: 99 meq/L (ref 96–112)
CO2: 12 mmol/L — AB (ref 19–32)
CREATININE: 13.08 mg/dL — AB (ref 0.50–1.35)
Calcium: 5.4 mg/dL — CL (ref 8.4–10.5)
GFR calc Af Amer: 4 mL/min — ABNORMAL LOW (ref >90)
GFR calc non Af Amer: 4 mL/min — ABNORMAL LOW (ref >90)
Glucose, Bld: 75 mg/dL (ref 70–99)
POTASSIUM: 4.8 mmol/L (ref 3.5–5.1)
Sodium: 140 mmol/L (ref 135–145)

## 2014-04-21 LAB — MRSA PCR SCREENING: MRSA BY PCR: NEGATIVE

## 2014-04-21 LAB — HIV ANTIBODY (ROUTINE TESTING W REFLEX): HIV 1&2 Ab, 4th Generation: NONREACTIVE

## 2014-04-21 LAB — BRAIN NATRIURETIC PEPTIDE: B Natriuretic Peptide: 1641.4 pg/mL — ABNORMAL HIGH (ref 0.0–100.0)

## 2014-04-21 LAB — TROPONIN I
Troponin I: 0.7 ng/mL (ref <0.031)
Troponin I: 0.63 ng/mL (ref <0.031)

## 2014-04-21 LAB — PHOSPHORUS: PHOSPHORUS: 8.7 mg/dL — AB (ref 2.3–4.6)

## 2014-04-21 LAB — HEPATITIS B SURFACE ANTIGEN: HEP B S AG: NEGATIVE

## 2014-04-21 LAB — MAGNESIUM: MAGNESIUM: 2.3 mg/dL (ref 1.5–2.5)

## 2014-04-21 MED ORDER — SODIUM CHLORIDE 0.9 % IV SOLN: 100.0000 mL | INTRAVENOUS | Status: DC | PRN

## 2014-04-21 MED ORDER — PENTAFLUOROPROP-TETRAFLUOROETH EX AERO: 1.0000 "application " | INHALATION_SPRAY | CUTANEOUS | Status: DC | PRN

## 2014-04-21 MED ORDER — LIDOCAINE-PRILOCAINE 2.5-2.5 % EX CREA: 1.0000 "application " | TOPICAL_CREAM | CUTANEOUS | Status: DC | PRN

## 2014-04-21 MED ORDER — VANCOMYCIN HCL IN DEXTROSE 750-5 MG/150ML-% IV SOLN
750.0000 mg | Freq: Once | INTRAVENOUS | Status: AC
  Administered 2014-04-21: 750 mg via INTRAVENOUS
  Filled 2014-04-21: qty 150

## 2014-04-21 MED ORDER — HEPARIN SODIUM (PORCINE) 1000 UNIT/ML DIALYSIS: 1000.0000 [IU] | INTRAMUSCULAR | Status: DC | PRN

## 2014-04-21 MED ORDER — LEVETIRACETAM 500 MG PO TABS: 1000.0000 mg | ORAL_TABLET | Freq: Two times a day (BID) | ORAL | Status: DC

## 2014-04-21 MED ORDER — DOXERCALCIFEROL 4 MCG/2ML IV SOLN
3.0000 ug | INTRAVENOUS | Status: DC
  Administered 2014-04-23 – 2014-04-25 (×2): 3 ug via INTRAVENOUS
  Filled 2014-04-21 (×2): qty 2

## 2014-04-21 MED ORDER — LIDOCAINE HCL (PF) 1 % IJ SOLN: 5.0000 mL | INTRAMUSCULAR | Status: DC | PRN

## 2014-04-21 MED ORDER — NEPRO/CARBSTEADY PO LIQD: 237.0000 mL | ORAL | Status: DC | PRN

## 2014-04-21 MED ORDER — DEXTROSE 50 % IV SOLN
1.0000 | Freq: Once | INTRAVENOUS | Status: AC
  Administered 2014-04-21: 50 mL via INTRAVENOUS
  Filled 2014-04-21: qty 50

## 2014-04-21 MED ORDER — DEXTROSE 5 % IV SOLN: 1.0000 g | Freq: Three times a day (TID) | INTRAVENOUS | Status: DC

## 2014-04-21 MED ORDER — ALTEPLASE 2 MG IJ SOLR: 2.0000 mg | Freq: Once | INTRAMUSCULAR | Status: DC | PRN

## 2014-04-21 MED ORDER — CEFEPIME HCL 2 G IJ SOLR: 2.0000 g | Freq: Once | INTRAMUSCULAR | Status: DC

## 2014-04-21 MED ORDER — AMLODIPINE BESYLATE 5 MG PO TABS
5.0000 mg | ORAL_TABLET | Freq: Every day | ORAL | Status: DC
  Administered 2014-04-21 – 2014-04-23 (×3): 5 mg via ORAL
  Filled 2014-04-21 (×4): qty 1

## 2014-04-21 MED ORDER — DEXTROSE 5 % IV SOLN
1.0000 g | Freq: Once | INTRAVENOUS | Status: AC
  Administered 2014-04-21: 1 g via INTRAVENOUS
  Filled 2014-04-21: qty 1

## 2014-04-21 MED ORDER — LIDOCAINE-PRILOCAINE 2.5-2.5 % EX CREA
1.0000 "application " | TOPICAL_CREAM | CUTANEOUS | Status: DC | PRN
  Filled 2014-04-21: qty 5

## 2014-04-21 MED ORDER — VANCOMYCIN HCL 10 G IV SOLR
1500.0000 mg | Freq: Once | INTRAVENOUS | Status: AC
  Administered 2014-04-21: 1500 mg via INTRAVENOUS
  Filled 2014-04-21: qty 1500

## 2014-04-21 MED ORDER — HEPARIN SODIUM (PORCINE) 1000 UNIT/ML DIALYSIS
1000.0000 [IU] | INTRAMUSCULAR | Status: DC | PRN
  Filled 2014-04-21: qty 1

## 2014-04-21 MED ORDER — ALBUTEROL (5 MG/ML) CONTINUOUS INHALATION SOLN
10.0000 mg/h | INHALATION_SOLUTION | Freq: Once | RESPIRATORY_TRACT | Status: AC
  Administered 2014-04-21: 10 mg/h via RESPIRATORY_TRACT
  Filled 2014-04-21: qty 20

## 2014-04-21 MED ORDER — SODIUM CHLORIDE 0.9 % IV SOLN
1.0000 g | Freq: Once | INTRAVENOUS | Status: AC
  Administered 2014-04-21: 1 g via INTRAVENOUS
  Filled 2014-04-21: qty 10

## 2014-04-21 MED ORDER — SODIUM BICARBONATE 8.4 % IV SOLN
50.0000 meq | Freq: Once | INTRAVENOUS | Status: AC
  Administered 2014-04-21: 50 meq via INTRAVENOUS
  Filled 2014-04-21: qty 50

## 2014-04-21 MED ORDER — SODIUM CHLORIDE 0.9 % IJ SOLN
3.0000 mL | Freq: Two times a day (BID) | INTRAMUSCULAR | Status: DC
  Administered 2014-04-22 – 2014-04-23 (×3): 3 mL via INTRAVENOUS

## 2014-04-21 MED ORDER — PROMETHAZINE HCL 25 MG/ML IJ SOLN
12.5000 mg | Freq: Three times a day (TID) | INTRAMUSCULAR | Status: DC | PRN
  Administered 2014-04-22: 12.5 mg via INTRAVENOUS
  Filled 2014-04-21: qty 1

## 2014-04-21 MED ORDER — HYDRALAZINE HCL 20 MG/ML IJ SOLN
10.0000 mg | Freq: Four times a day (QID) | INTRAMUSCULAR | Status: DC | PRN
  Administered 2014-04-21 – 2014-04-23 (×2): 10 mg via INTRAVENOUS
  Filled 2014-04-21 (×2): qty 1

## 2014-04-21 MED ORDER — ONDANSETRON HCL 4 MG/2ML IJ SOLN
4.0000 mg | Freq: Four times a day (QID) | INTRAMUSCULAR | Status: DC | PRN
Start: 2014-04-21 — End: 2014-04-24
  Administered 2014-04-21 – 2014-04-22 (×2): 4 mg via INTRAVENOUS
  Filled 2014-04-21 (×2): qty 2

## 2014-04-21 MED ORDER — DARBEPOETIN ALFA 200 MCG/0.4ML IJ SOSY
200.0000 ug | PREFILLED_SYRINGE | INTRAMUSCULAR | Status: DC
  Administered 2014-04-23: 200 ug via INTRAVENOUS
  Filled 2014-04-21: qty 0.4

## 2014-04-21 MED ORDER — INSULIN ASPART 100 UNIT/ML IV SOLN
10.0000 [IU] | Freq: Once | INTRAVENOUS | Status: AC
  Administered 2014-04-21: 10 [IU] via INTRAVENOUS

## 2014-04-21 MED ORDER — SODIUM CHLORIDE 0.9 % IV SOLN
Freq: Once | INTRAVENOUS | Status: DC
Start: 2014-04-21 — End: 2014-04-21

## 2014-04-21 MED ORDER — SODIUM POLYSTYRENE SULFONATE 15 GM/60ML PO SUSP
30.0000 g | Freq: Once | ORAL | Status: AC
  Administered 2014-04-21: 30 g via ORAL
  Filled 2014-04-21: qty 120

## 2014-04-21 MED ORDER — HYDROCODONE-ACETAMINOPHEN 5-325 MG PO TABS
1.0000 | ORAL_TABLET | Freq: Four times a day (QID) | ORAL | Status: DC | PRN
  Administered 2014-04-21: 1 via ORAL
  Filled 2014-04-21: qty 1

## 2014-04-21 MED ORDER — CARVEDILOL 6.25 MG PO TABS
6.2500 mg | ORAL_TABLET | Freq: Two times a day (BID) | ORAL | Status: DC
  Administered 2014-04-21 – 2014-04-24 (×6): 6.25 mg via ORAL
  Filled 2014-04-21 (×5): qty 1

## 2014-04-21 MED ORDER — CARVEDILOL 6.25 MG PO TABS: 6.2500 mg | ORAL_TABLET | Freq: Two times a day (BID) | ORAL | Status: DC

## 2014-04-21 MED ORDER — HYDROCODONE-ACETAMINOPHEN 5-325 MG PO TABS
1.0000 | ORAL_TABLET | ORAL | Status: DC | PRN
  Administered 2014-04-21 – 2014-04-22 (×6): 2 via ORAL
  Filled 2014-04-21 (×6): qty 2

## 2014-04-21 MED ORDER — HYDRALAZINE HCL 20 MG/ML IJ SOLN
10.0000 mg | Freq: Once | INTRAMUSCULAR | Status: AC
  Administered 2014-04-21: 10 mg via INTRAVENOUS
  Filled 2014-04-21: qty 1

## 2014-04-21 MED ORDER — ALTEPLASE 2 MG IJ SOLR
2.0000 mg | Freq: Once | INTRAMUSCULAR | Status: AC | PRN
  Filled 2014-04-21: qty 2

## 2014-04-21 MED ORDER — ALBUTEROL SULFATE (2.5 MG/3ML) 0.083% IN NEBU: 10.0000 mg | INHALATION_SOLUTION | Freq: Once | RESPIRATORY_TRACT | Status: DC

## 2014-04-21 MED ORDER — SODIUM CHLORIDE 0.9 % IV SOLN
Freq: Once | INTRAVENOUS | Status: AC
  Administered 2014-04-21: 09:00:00 via INTRAVENOUS

## 2014-04-21 MED ORDER — NEPRO/CARBSTEADY PO LIQD
237.0000 mL | Freq: Two times a day (BID) | ORAL | Status: DC
  Filled 2014-04-21 (×12): qty 237

## 2014-04-21 MED ORDER — CALCIUM ACETATE 667 MG PO CAPS
2001.0000 mg | ORAL_CAPSULE | Freq: Three times a day (TID) | ORAL | Status: DC
  Administered 2014-04-21 – 2014-04-24 (×7): 2001 mg via ORAL
  Filled 2014-04-21 (×17): qty 3

## 2014-04-21 MED ORDER — HEPARIN SODIUM (PORCINE) 5000 UNIT/ML IJ SOLN
5000.0000 [IU] | Freq: Three times a day (TID) | INTRAMUSCULAR | Status: DC
  Administered 2014-04-21 – 2014-04-23 (×4): 5000 [IU] via SUBCUTANEOUS
  Filled 2014-04-21 (×4): qty 1

## 2014-04-21 MED ORDER — ACETAMINOPHEN 325 MG PO TABS
650.0000 mg | ORAL_TABLET | Freq: Four times a day (QID) | ORAL | Status: DC | PRN
  Administered 2014-04-21: 650 mg via ORAL
  Filled 2014-04-21: qty 2

## 2014-04-21 MED ORDER — DEXTROSE 5 % IV SOLN
2.0000 g | Freq: Once | INTRAVENOUS | Status: AC
  Administered 2014-04-21: 2 g via INTRAVENOUS
  Filled 2014-04-21: qty 2

## 2014-04-21 NOTE — ED Notes (Signed)
Went into round on pt and update him on his bed status. Pt was asking why he was still in the ED and was upset that he didn't have any pain relief. Explained to pt that we're working very hard to get his room and that I would check with the nurse if he can have a different pain medication. Pt fell asleep when explaining what was going on woke up again as I was leaving asking me the same questions.

## 2014-04-21 NOTE — Progress Notes (Signed)
CRITICAL VALUE ALERT  Critical value received: Hemoglobin  Date of notification:  04/21/14  Time of notification:  7:47 am  Critical value read back:Yes.    Nurse who received alert:  Valarie Merino  MD notified (1st page): Dr. Posey Pronto  Time of first page: 7:48   MD notified (2nd page):  Time of second page:  Responding MD:    Time MD responded:

## 2014-04-21 NOTE — ED Notes (Signed)
Teaching service at bedside

## 2014-04-21 NOTE — ED Notes (Signed)
Panic labs reported from the main lab

## 2014-04-21 NOTE — Progress Notes (Signed)
ANTIBIOTIC CONSULT NOTE - FOLLOW UP  Pharmacy Consult for Vancomycin and Cefepime Indication: R/O Sepsis, HCAP  Allergies  Allergen Reactions  . Reglan [Metoclopramide] Other (See Comments)    Tardive dyskinesia in 11/2011 in [place]    Patient Measurements: Height: 5\' 9"  (175.3 cm) Weight: 158 lb 4.6 oz (71.8 kg) (bed) IBW/kg (Calculated) : 70.7   Vital Signs: Temp: 97.6 F (36.4 C) (01/03 1145) Temp Source: Oral (01/03 1145) BP: 190/81 mmHg (01/03 1345) Pulse Rate: 104 (01/03 1345) Intake/Output from previous day: 01/02 0701 - 01/03 0700 In: 550 [I.V.:550] Out: -  Intake/Output from this shift: Total I/O In: 120 [P.O.:120] Out: -   Labs:  Recent Labs  04/20/14 2303 04/21/14 0250 04/21/14 0645  WBC 10.6*  --  8.5  HGB 7.7*  --  6.6*  PLT 193  --  166  CREATININE 13.27* 13.08*  --    Estimated Creatinine Clearance: 6.3 mL/min (by C-G formula based on Cr of 13.08). No results for input(s): VANCOTROUGH, VANCOPEAK, VANCORANDOM, GENTTROUGH, GENTPEAK, GENTRANDOM, TOBRATROUGH, TOBRAPEAK, TOBRARND, AMIKACINPEAK, AMIKACINTROU, AMIKACIN in the last 72 hours.   Microbiology: Recent Results (from the past 720 hour(s))  MRSA PCR Screening     Status: None   Collection Time: 04/21/14  6:26 AM  Result Value Ref Range Status   MRSA by PCR NEGATIVE NEGATIVE Final    Comment:        The GeneXpert MRSA Assay (FDA approved for NASAL specimens only), is one component of a comprehensive MRSA colonization surveillance program. It is not intended to diagnose MRSA infection nor to guide or monitor treatment for MRSA infections.     Anti-infectives    Start     Dose/Rate Route Frequency Ordered Stop   04/21/14 0600  ceFEPIme (MAXIPIME) 1 g in dextrose 5 % 50 mL IVPB  Status:  Discontinued     1 g100 mL/hr over 30 Minutes Intravenous 3 times per day 04/21/14 0434 04/21/14 0455   04/21/14 0500  ceFEPIme (MAXIPIME) 2 g in dextrose 5 % 50 mL IVPB     2 g100 mL/hr over 30  Minutes Intravenous  Once 04/21/14 0456     04/21/14 0145  vancomycin (VANCOCIN) 1,500 mg in sodium chloride 0.9 % 500 mL IVPB     1,500 mg250 mL/hr over 120 Minutes Intravenous  Once 04/21/14 0133 04/21/14 0528   04/21/14 0115  ceFEPIme (MAXIPIME) 1 g in dextrose 5 % 50 mL IVPB     1 g100 mL/hr over 30 Minutes Intravenous  Once 04/21/14 0114 04/21/14 0245      Assessment: [name] with h/o ESRD on T/T/S HD admitted with fever, chills, and SOB. Pharmacy consulted to start Vancomycin and Cefepime for R/O Sepsis, PNA.  Patient received 1500 mg dose of Vancomycin in the ED and 1 g of Cefepime in the ED (2 g dose was never given). WBC is WNL, patient is afebrile. Blood and sputum cultures are pending. Patient currently in dialysis and appears to be tolerating a BFR of 400.   Goal of Therapy:  Target pre-HD level 15-25 mcg/mL Target post-HD level 5-15 mcg/mL Resolution of infection and clinical improvement  Plan:  - Give Cefepime 2 g X 1 after HD today. Follow up plans for future HD and schedule repeat doses. - Follow-up total duration and tolerance of HD today.  If tolerates full session, give Vancomycin 750 mg X 1. - Follow-up plans for dialysis schedule - Monitor CBC, cultures, and patient's clinical progress.  [name],  PharmD Clinical Pharmacist - Resident Pager: [phone] 1/3/20162:17 PM

## 2014-04-21 NOTE — Progress Notes (Signed)
Subjective: Patient complaining of headache and cough. Denies chest pain. No nausea, vomiting, fever, or chills. Does admit to dizziness, and lightheadedness, weakness and fatigue.   Objective: Vital signs in last 24 hours: Filed Vitals:   04/21/14 0515 04/21/14 0545 04/21/14 0658 04/21/14 0800  BP: 160/38 156/54 166/56 128/52  Pulse: 108 107 106 105  Temp:   97.7 F (36.5 C) 97.9 F (36.6 C)  TempSrc:   Oral Oral  Resp:   20 20  Height:   5' 9" (1.753 m)   Weight:   156 lb 6.4 oz (70.943 kg)   SpO2:  95% 90% 94%   Weight change:   Intake/Output Summary (Last 24 hours) at 04/21/14 1013 Last data filed at 04/21/14 0829  Gross per 24 hour  Intake    670 ml  Output      0 ml  Net    670 ml   Physical Exam: General: AA male, alert, cooperative, NAD. HEENT: PERRL, EOMI. Moist mucus membranes Neck: Full range of motion without pain, supple, no lymphadenopathy or carotid bruits Lungs: Coarse breath sounds throughout. No wheezes. Air entry equal bilaterally.  Heart: Tachycardic, no murmurs, gallops, or rubs Abdomen: Soft, non-tender, non-distended, BS + Extremities: No cyanosis, clubbing, or edema. Significant dry skin and flaking on LE's.  Neurologic: Alert & oriented x3, cranial nerves II-XII intact, strength grossly intact, sensation intact to light touch   Lab Results: Basic Metabolic Panel:  Recent Labs Lab 04/20/14 2303 04/21/14 0250 04/21/14 0645  NA 135 140  --   K 6.8* 4.8  --   CL 94* 99  --   CO2 17* 12*  --   GLUCOSE 102* 75  --   BUN 155* 160*  --   CREATININE 13.27* 13.08*  --   CALCIUM 5.6* 5.4*  --   MG  --   --  2.3  PHOS  --   --  8.7*   Liver Function Tests:  Recent Labs Lab 04/20/14 2303  AST 31  ALT 22  ALKPHOS 61  BILITOT 0.7  PROT 6.6  ALBUMIN 2.7*   CBC:  Recent Labs Lab 04/20/14 2303 04/21/14 0645  WBC 10.6* 8.5  NEUTROABS 9.4* 7.5  HGB 7.7* 6.6*  HCT 23.7* 20.2*  MCV 90.8 89.8  PLT 193 166   Cardiac  Enzymes:  Recent Labs Lab 04/21/14 0645  TROPONINI 0.70*   Micro Results: Recent Results (from the past 240 hour(s))  MRSA PCR Screening     Status: None   Collection Time: 04/21/14  6:26 AM  Result Value Ref Range Status   MRSA by PCR NEGATIVE NEGATIVE Final    Comment:        The GeneXpert MRSA Assay (FDA approved for NASAL specimens only), is one component of a comprehensive MRSA colonization surveillance program. It is not intended to diagnose MRSA infection nor to guide or monitor treatment for MRSA infections.    Studies/Results: Dg Chest Port 1 View  04/21/2014   CLINICAL DATA:  Cough and fever for 3 days.  EXAM: PORTABLE CHEST - 1 VIEW  COMPARISON:  04/05/2014  FINDINGS: Cardiac enlargement without vascular congestion. New development of airspace consolidation in the right lower lung and left mid lung most likely representing pneumonia. No blunting of costophrenic angles. No pneumothorax. Postoperative changes and degenerative change in the right shoulder.  IMPRESSION: Developing airspace consolidation in the right lower lung and left mid lung most likely represent pneumonia.   Electronically Signed  By: Lucienne Capers M.D.   On: 04/21/2014 01:16   Medications: I have reviewed the patient's current medications. Scheduled Meds: . calcium acetate  2,001 mg Oral TID WC  . ceFEPime (MAXIPIME) IV  2 g Intravenous Once  . [START ON 04/23/2014] Darbepoetin Alfa  200 mcg Intravenous Q Tue-HD  . [START ON 04/23/2014] doxercalciferol  3 mcg Intravenous Q T,Th,Sa-HD  . feeding supplement (NEPRO CARB STEADY)  237 mL Oral BID BM  . heparin  5,000 Units Subcutaneous 3 times per day  . [START ON 04/22/2014] levETIRAcetam  1,000 mg Oral BID  . sodium chloride  3 mL Intravenous Q12H   Continuous Infusions:  PRN Meds:.HYDROcodone-acetaminophen   Assessment/Plan: 59 y.o. male w/ PMHx of HTN, anemia, ESRD on HD (TTS), chronic dCHF, seizure disorder, and polysubstance abuse, admitted for  HCAP and hyperkalemia.   HCAP: Recently hospitalized, now with cough, SOB, and CXR suggestive of RLL and LML infiltrates.May also be contributed to by volume overload as discussed below. Started on Cefepime and Vancomycin in the ER for HCAP coverage.Leukocytosis resolved.  -Continue Vancomycin and Cefepime for now.  -Repeat CXR 2v in AM to assess after HD -Respiratory virus panel, legionella and strep antigen pending -Blood and sputum cultures pending -HIV pending  NSTEMI: Troponin peaked at 0.70 this AM, most recently 0.63. Repeat EKG shows prolonged QTc, and LVH, w/ normalization of t-waves in inferior leads when compared to EKG on 04/05/14. Previous EKG's looking back to 02/15/14 appear similar, however, patient has shown significant changes in t-waves through most leads looking back. Had mild troponin elevation (0.33) on 02/16/14 w/ no classical signs of angina at that time, cardiology underwent no further treatment. Patient denies chest pain currently. May have troponin elevation in the setting of ESRD.  -Repeat EKG tomorrow AM -Continue to trend troponins  ESRD on HD (TTS): ESRD thought to be due to cocaine use. Last hemodialysis on Thursday. Lungs w/ coarse breath sounds throughout, however, appears somewhat dry on exam. BP stable.  -Renal consulted, appreciate recs; for HD this AM.  -Continue home Phoslo and Hectorol  Hyperkalemia: Resolved. K 6.8 on admission w/ peaked T waves on EKG. Received kayexalate, insulin/glucose, albuterol, NaHCO3, and calcium gluconate. K most recently 4.8. For HD this AM.  -HD as above -Repeat BMP in AM  Normocytic anemia: Required multiple transfusion in past. Extensive work-up recently, including recent hospitalization without finding source of GI bleeding and bone marrow biopsy with concern for B cell lymphoproliferative process, but oncology feels anemia is secondary to ESRD with some ongoing GI bleeding. Repeat Hb 6.6 this AM. Patient admits to  lightheadedness and weakness.  -Transfuse 1 unit w/ HD; target Hb > 7 -Continue to monitor CBC -Continue Aranesp w/ HD  HTN: Normotensive currently.  -Hold Hydralazine for now -HD this AM  Seizure disorder: Stable. Reported by patient, no recent seizures. -Continue home Keppra 1000 mg bid  DVT/PE PPx: Heparin [place]  Dispo: Disposition is deferred at this time, awaiting improvement of current medical problems.  Anticipated discharge in approximately 1-2 day(s).   The patient does have a current PCP Windy Kalata, MD) and does not need an [facility] hospital follow-up appointment after discharge.  The patient does not have transportation limitations that hinder transportation to clinic appointments.  .Services Needed at time of discharge: Y = Yes, Blank = No PT:   OT:   RN:   Equipment:   Other:     LOS: 0 days   Corky Sox, MD  04/21/2014, 10:13 AM  

## 2014-04-21 NOTE — Progress Notes (Addendum)
ANTIBIOTIC CONSULT NOTE - INITIAL  Pharmacy Consult for Vancomycin Indication: pneumonia  Allergies  Allergen Reactions  . Reglan [Metoclopramide] Other (See Comments)    Tardive dyskinesia in 11/2011 in [place]    Patient Measurements:   Adjusted Body Weight: n/a   Vital Signs: Temp: 98 F (36.7 C) (01/02 2306) Temp Source: Oral (01/02 2306) BP: 157/71 mmHg (01/03 0012) Pulse Rate: 88 (01/03 0118) Intake/Output from previous day:   Intake/Output from this shift:    Labs:  Recent Labs  04/20/14 2303  WBC 10.6*  HGB 7.7*  PLT 193  CREATININE 13.27*   Estimated Creatinine Clearance: 5.8 mL/min (by C-G formula based on Cr of 13.27). No results for input(s): VANCOTROUGH, VANCOPEAK, VANCORANDOM, GENTTROUGH, GENTPEAK, GENTRANDOM, TOBRATROUGH, TOBRAPEAK, TOBRARND, AMIKACINPEAK, AMIKACINTROU, AMIKACIN in the last 72 hours.   Microbiology: No results found for this or any previous visit (from the past 720 hour(s)).  Medical History: Past Medical History  Diagnosis Date  . Hypertension   . Anemia, chronic disease     a. Capsule endoscopy reportedly negative 08/2013. b. seen by heme with concern for minor B cell population on BMB, being observed.  . Chronic diastolic CHF (congestive heart failure)     a. EF 60 - 65% per [place] echo 11/2011. b. Echo 02/2012: severe LVH, focal basal hypertrophy, EF 60-65%, mild Mr.  . Cocaine abuse     mentioned in notes from [place]  . Hepatitis C antibody test positive     was HIV negative, 02/28/12  . Hepatitis B core antibody positive     03/01/10  . Positive QuantiFERON-TB Gold test     11/2011  . Helicobacter pylori gastritis     not defined if this was treated  . Polyp of colon, adenomatous     May 2012.  Dr Trenton Founds in [place]  . Hematochezia   . Head injury, closed, with concussion   . History of blood transfusion   . Arthritis     "right shoulder" (11/09/2012)  . Seizure disorder     questionable history of - will  need to clarify with PCP  . Headache(784.0)   . ESRD on hemodialysis since 2012    a. Since 2012. ESRD was due to "drugs", primarily used cocaine.  Has 3-5 year hx of HTN, no DM.  Gets HD on TTS schedule at [place]. Originally from [place].   . Hypercalcemia   . Hyperpotassemia   . Tobacco abuse   . Protein-calorie malnutrition, severe   . Optic neuritis, left   . Hypertensive heart disease     Medications:   (Not in a hospital admission) Assessment: 50 YOM with h/o ESRD on T/T/S HD admitted with fever, chills, and SOB. Pharmacy consulted to start Vancomycin for pneumonia. Patient also has an order for Cefepime while in the ED. WBC 10.6, Pt is afebrile.  Goal of Therapy:  Pre-HD Vancomycin level 15-25 mcg/ml  Plan:  -Vancomycin 1500 mg IV x 1 dose. F/u HD schedule  -Schedule Vancomycin 750 mg IV Q HD  -Monitor CBC, cultures and patients clinical progress -F/u gram negative coverage on admission   Albertina Parr, PharmD., BCPS Clinical Pharmacist Pager [phone]  Addendum: Cefepime added for gram negative coverage. Give Cefepime 2 gm IV x 1 dose. Schedule Cefepime 2 gm IV Q HD once HD schedule is determined.   Albertina Parr, PharmD., BCPS Clinical Pharmacist Pager [phone]

## 2014-04-21 NOTE — ED Notes (Signed)
Attempted report 

## 2014-04-21 NOTE — ED Notes (Signed)
Per Dr. Tamera Punt, hold blood until plan for dialysis is in place.

## 2014-04-21 NOTE — Progress Notes (Signed)
CRITICAL VALUE ALERT  Critical value received: Troponin   Date of notification:  04/21/14  Time of notification:  8:50 am  Critical value read back:Yes.    Nurse who received alert:  Valarie Merino  MD notified (1st page):  Dr. Posey Pronto  Time of first page:  09:00 am  MD notified (2nd page):  Time of second page:  Responding MD:  Dr. Posey Pronto  Time MD responded:  09:05 am

## 2014-04-21 NOTE — ED Provider Notes (Signed)
CSN: [id number]     Arrival date & time 04/20/14  2252 History   First MD Initiated Contact with Patient 04/21/14 0006    This chart was scribed for Malvin Johns, MD by Terressa Koyanagi, ED Scribe. This patient was seen in room A05C/A05C and the patient's care was started at 12:14 AM.  Chief Complaint  Patient presents with  . Cough   Patient is a 59 y.o. male presenting with cough. The history is provided by the patient. No language interpreter was used.  Cough Cough characteristics:  Productive Onset quality:  Sudden Duration:  3 days Timing:  Intermittent Progression:  Unchanged Smoker: yes   Associated symptoms: shortness of breath   Associated symptoms: no chest pain, no chills, no diaphoresis, no fever, no headaches, no rash and no rhinorrhea   Associated symptoms comment:  Chest congestion    PCP: Windy Kalata, MD HPI Comments: [name] is a 59 y.o. male, with medical Hx noted below including esrd on hd (pt is normally on hemodialysis T/R/Sat, however, pt did not complete his course this past Thursday and did not attend his dialysis this past Saturday, was scheduled for tomorrow instead due to holiday), presents to the Emergency Department complaining of intermittent productive cough with associated SOB and chest congestion onset 3 days ago. Pt denies fever, chills, melena, SOB at rest, n/v, or diarrhea.  He feels fatigued and feels that his blood counts are low.  Has had to get blood transfusions in the past.  Past Medical History  Diagnosis Date  . Hypertension   . Anemia, chronic disease     a. Capsule endoscopy reportedly negative 08/2013. b. seen by heme with concern for minor B cell population on BMB, being observed.  . Chronic diastolic CHF (congestive heart failure)     a. EF 60 - 65% per [place] echo 11/2011. b. Echo 02/2012: severe LVH, focal basal hypertrophy, EF 60-65%, mild Mr.  . Cocaine abuse     mentioned in notes from [place]  . Hepatitis C antibody  test positive     was HIV negative, 02/28/12  . Hepatitis B core antibody positive     03/01/10  . Positive QuantiFERON-TB Gold test     11/2011  . Helicobacter pylori gastritis     not defined if this was treated  . Polyp of colon, adenomatous     May 2012.  Dr Trenton Founds in [place]  . Hematochezia   . Head injury, closed, with concussion   . History of blood transfusion   . Arthritis     "right shoulder" (11/09/2012)  . Seizure disorder     questionable history of - will need to clarify with PCP  . Headache(784.0)   . ESRD on hemodialysis since 2012    a. Since 2012. ESRD was due to "drugs", primarily used cocaine.  Has 3-5 year hx of HTN, no DM.  Gets HD on TTS schedule at [facility]. Originally from [place].   . Hypercalcemia   . Hyperpotassemia   . Tobacco abuse   . Protein-calorie malnutrition, severe   . Optic neuritis, left   . Hypertensive heart disease    Past Surgical History  Procedure Laterality Date  . Shoulder open rotator cuff repair Right   . Total knee arthroplasty Left   . Bascilic vein transposition  03/07/2012    Procedure: BASCILIC VEIN TRANSPOSITION;  Surgeon: Conrad , MD;  Location: [facility];  Service: Vascular;  Laterality: Left;  First Stage  .  Insertion of dialysis catheter      right chest  . Bascilic vein transposition Left 05/31/2012    Procedure: BASCILIC VEIN TRANSPOSITION;  Surgeon: Conrad Delano, MD;  Location: [facility];  Service: Vascular;  Laterality: Left;  Left 2nd Stage Basilic Vein Transposition with gortex graft revision using 36mmx10cm graft  . Shoulder open rotator cuff repair Right   . [name] capsule study N/A 08/27/2013    Procedure: [name] CAPSULE STUDY;  Surgeon: Milus Banister, MD;  Location: [facility];  Service: Endoscopy;  Laterality: N/A;   Family History  Problem Relation Age of Onset  . Diabetes Mother   . Hypertension Mother   . Stroke Mother   . Kidney failure Mother   . Cancer Father    History  Substance  Use Topics  . Smoking status: Current Every Day Smoker -- 0.25 packs/day for 25 years    Types: Cigarettes  . Smokeless tobacco: Never Used  . Alcohol Use: No     Comment: 11/09/2012 "been stopped drinking 1-2 yr ago"    Review of Systems  Constitutional: Positive for fatigue. Negative for fever, chills and diaphoresis.  HENT: Positive for congestion. Negative for rhinorrhea and sneezing.   Eyes: Negative.   Respiratory: Positive for cough and shortness of breath. Negative for chest tightness.   Cardiovascular: Negative for chest pain and leg swelling.  Gastrointestinal: Negative for nausea, vomiting, abdominal pain, diarrhea and blood in stool.  Genitourinary: Negative for frequency, hematuria, flank pain and difficulty urinating.  Musculoskeletal: Negative for back pain and arthralgias.  Skin: Negative for rash.  Neurological: Negative for dizziness, speech difficulty, weakness, numbness and headaches.  Psychiatric/Behavioral: Negative for confusion.      Allergies  Reglan  Home Medications   Prior to Admission medications   Medication Sig Start Date End Date Taking? Authorizing Provider  calcium acetate (PHOSLO) 667 MG capsule Take 2,001 mg by mouth 3 (three) times daily with meals.     Historical Provider, MD  Darbepoetin Alfa (ARANESP) 200 MCG/0.4ML SOSY injection Inject 0.4 mLs (200 mcg total) into the vein every Tuesday with hemodialysis. 04/09/14   Jones Bales, MD  doxercalciferol (HECTOROL) 4 MCG/2ML injection Inject 1.5 mLs (3 mcg total) into the vein Every Tuesday,Thursday,and Saturday with dialysis. 04/07/14   Jones Bales, MD  hydrALAZINE (APRESOLINE) 100 MG tablet Take 100 mg by mouth 2 (two) times daily. DON'T TAKE ON DIALYSIS DAYS    Historical Provider, MD  levETIRAcetam (KEPPRA) 1000 MG tablet Take 1,000 mg by mouth 2 (two) times daily.     Historical Provider, MD  Nutritional Supplements (FEEDING SUPPLEMENT, NEPRO CARB STEADY,) LIQD Take 237 mLs by mouth  2 (two) times daily between meals. 04/07/14   Jones Bales, MD   Triage Vitals: BP 148/98 mmHg  Pulse 86  Temp(Src) 98 F (36.7 C) (Oral)  Resp 18  SpO2 96% Physical Exam  Constitutional: [name] oriented to person, place, and time. He appears well-developed and well-nourished.  HENT:  Head: Normocephalic and atraumatic.  Eyes: Pupils are equal, round, and reactive to light.  Neck: Normal range of motion. Neck supple.  Cardiovascular: Normal rate, regular rhythm and normal heart sounds.   Pulmonary/Chest: Effort normal and breath sounds normal. No respiratory distress. He has no wheezes. He has no rales. He exhibits no tenderness.  Few crackles on bases, bilatearlly.   Abdominal: Soft. Bowel sounds are normal. There is no tenderness. There is no rebound and no guarding.  Musculoskeletal: Normal range  of motion. He exhibits no edema.  Lymphadenopathy:    He has no cervical adenopathy.  Neurological: [name] alert and oriented to person, place, and time.  Skin: Skin is warm and dry. No rash noted.  Psychiatric: He has a normal mood and affect.    ED Course  Procedures (including critical care time) DIAGNOSTIC STUDIES: Oxygen Saturation is 96% on RA, adequate by my interpretation.    COORDINATION OF CARE: 12:20 AM-Discussed treatment plan which includes EKG, meds, labs, imaging, likely hospital admittance with pt at bedside and pt agreed to plan.   Labs Review Labs Reviewed  CBC WITH DIFFERENTIAL - Abnormal; Notable for the following:    WBC 10.6 (*)    RBC 2.61 (*)    Hemoglobin 7.7 (*)    HCT 23.7 (*)    Neutrophils Relative % 89 (*)    Neutro Abs 9.4 (*)    Lymphocytes Relative 6 (*)    All other components within normal limits  COMPREHENSIVE METABOLIC PANEL - Abnormal; Notable for the following:    Potassium 6.8 (*)    Chloride 94 (*)    CO2 17 (*)    Glucose, Bld 102 (*)    BUN 155 (*)    Creatinine, Ser 13.27 (*)    Calcium 5.6 (*)    Albumin 2.7 (*)    GFR  calc non Af Amer 4 (*)    GFR calc Af Amer 4 (*)    Anion gap 24 (*)    All other components within normal limits  BRAIN NATRIURETIC PEPTIDE  POC OCCULT BLOOD, ED  PREPARE RBC (CROSSMATCH)  TYPE AND SCREEN    Imaging Review No results found.   EKG Interpretation   Date/Time:  Saturday April 20 2014 22:57:36 EST Ventricular Rate:  89 PR Interval:  190 QRS Duration: 112 QT Interval:  426 QTC Calculation: 518 R Axis:   78 Text Interpretation:  Normal sinus rhythm Possible Left atrial enlargement  Incomplete left bundle branch block Left ventricular hypertrophy with  repolarization abnormality Prolonged QT Abnormal ECG Confirmed by Luzelena Heeg   MD, Soley Harriss ([id number]) on 04/21/2014 12:09:04 AM      MDM   Final diagnoses:  SOB (shortness of breath)    Pt with elevated K.  Will give meds for hyperkalema including kayexelate, insulin/glucose, albuterol, sodium bicarb.  Will type and cross 2 units or PRBCs.  Consult nephrology for dialysis.  CXR shows pneumonia.  Will start abx for HCAP.  sats okay.  1:45: spoke to the IM teaching service who will admit pt (pt is bounce back to their service).  2:38 spoke with Dr. Jonnie Finner who says that the nephrology team will see the pt in the am  CRITICAL CARE Performed by: Lincoln Ginley Total critical care time: 45 Critical care time was exclusive of separately billable procedures and treating other patients. Critical care was necessary to treat or prevent imminent or life-threatening deterioration. Critical care was time spent personally by me on the following activities: development of treatment plan with patient and/or surrogate as well as nursing, discussions with consultants, evaluation of patient's response to treatment, examination of patient, obtaining history from patient or surrogate, ordering and performing treatments and interventions, ordering and review of laboratory studies, ordering and review of radiographic studies, pulse oximetry  and re-evaluation of patient's condition.   I personally performed the services described in this documentation, which was scribed in my presence.  The recorded information has been reviewed and considered.    Threasa Beards  Tamera Punt, MD 04/21/14 [phone]

## 2014-04-21 NOTE — H&P (Signed)
Date: 04/21/2014               Patient Name:  [name] MRN: [id number]  DOB: June 28, 1955 Age / Sex: 59 y.o., male   PCP: Windy Kalata, MD              Medical Service: Internal Medicine Teaching Service              Attending Physician: Dr. Malvin Johns, MD    First Contact: Dr. Raelene Bott Pager: [phone]  Second Contact: Dr. Ronnald Ramp Pager: [phone]            After Hours (After 5p/  First Contact Pager: [phone]  weekends / holidays): Second Contact Pager: [phone]   Chief Complaint:  Cough  History of Present Illness: [name] is a 59 year old man with history of HTN, normocytic anemia requiring multiple tranfusions, diastolic CHF, seizure disorder, ESRD on HD (TThS), severe protein-calorie malnutrition and polysubstance abuse presenting with cough.  He was recently admitted from 04/05/14 through 04/07/14 for symptomatic anemia that was attributed to his ESRD.  He reports developing a cough 3 days ago associated with shortness of breath and fatigue.  His cough has been productive of sputum, but he denies hemoptysis.  He also reports fevers, chills, diaphoresis, and a headache.  He says his chest feels tight all over, and he has sharp left sided chest pain that is worse with deep breaths.  He says he initially thought he had a cold because he had a sore throat, congestion, and rhinorrhea, but his shortness of breath has gotten worse.  [name] currently living at a skilled nursing facility, and he says that many of the other residents also have coughs and cold-like symptoms.  He denies abdominal pain, melena, nausea, vomiting, or diarrhea.  He last had dialysis on Thursday,and his Saturday dialysis was moved to today due to the [place].  Review of Systems: Review of Systems  Constitutional: Positive for fever, chills, malaise/fatigue and diaphoresis.  HENT: Positive for congestion and sore throat.   Eyes: Negative for blurred vision.  Respiratory: Positive for cough,  sputum production and shortness of breath. Negative for hemoptysis.   Cardiovascular: Positive for chest pain and palpitations. Negative for orthopnea and leg swelling.  Gastrointestinal: Negative for nausea, vomiting, abdominal pain, diarrhea, constipation, blood in stool and melena.  Genitourinary: Negative for dysuria and hematuria.  Musculoskeletal: Positive for myalgias. Negative for joint pain.  Skin: Negative for itching and rash.  Neurological: Positive for dizziness, weakness and headaches. Negative for sensory change and focal weakness.    Meds:  (Not in a hospital admission) Current Facility-Administered Medications  Medication Dose Route Frequency Provider Last Rate Last Dose  . 0.9 %  sodium chloride infusion   Intravenous Once Malvin Johns, MD      . vancomycin (VANCOCIN) 1,500 mg in sodium chloride 0.9 % 500 mL IVPB  1,500 mg Intravenous Once Lavenia Atlas, RPH 250 mL/hr at 04/21/14 0254 1,500 mg at 04/21/14 0254   Current Outpatient Prescriptions  Medication Sig Dispense Refill  . calcium acetate (PHOSLO) 667 MG capsule Take 2,001 mg by mouth 3 (three) times daily with meals.     . Darbepoetin Alfa (ARANESP) 200 MCG/0.4ML SOSY injection Inject 0.4 mLs (200 mcg total) into the vein every Tuesday with hemodialysis. 1.68 mL   . doxercalciferol (HECTOROL) 4 MCG/2ML injection Inject 1.5 mLs (3 mcg total) into the vein Every Tuesday,Thursday,and Saturday with dialysis. 2 mL   .  hydrALAZINE (APRESOLINE) 100 MG tablet Take 100 mg by mouth 2 (two) times daily. DON'T TAKE ON DIALYSIS DAYS    . levETIRAcetam (KEPPRA) 1000 MG tablet Take 1,000 mg by mouth 2 (two) times daily.     . Nutritional Supplements (FEEDING SUPPLEMENT, NEPRO CARB STEADY,) LIQD Take 237 mLs by mouth 2 (two) times daily between meals.  0    Allergies: Allergies as of 04/20/2014 - Review Complete 04/20/2014  Allergen Reaction Noted  . Reglan [metoclopramide] Other (See Comments) 03/02/2012   Past  Medical History  Diagnosis Date  . Hypertension   . Anemia, chronic disease     a. Capsule endoscopy reportedly negative 08/2013. b. seen by heme with concern for minor B cell population on BMB, being observed.  . Chronic diastolic CHF (congestive heart failure)     a. EF 60 - 65% per [place] echo 11/2011. b. Echo 02/2012: severe LVH, focal basal hypertrophy, EF 60-65%, mild Mr.  . Cocaine abuse     mentioned in notes from [facility]  . Hepatitis C antibody test positive     was HIV negative, 02/28/12  . Hepatitis B core antibody positive     03/01/10  . Positive QuantiFERON-TB Gold test     11/2011  . Helicobacter pylori gastritis     not defined if this was treated  . Polyp of colon, adenomatous     May 2012.  Dr Trenton Founds in [place]  . Hematochezia   . Head injury, closed, with concussion   . History of blood transfusion   . Arthritis     "right shoulder" (11/09/2012)  . Seizure disorder     questionable history of - will need to clarify with PCP  . Headache(784.0)   . ESRD on hemodialysis since 2012    a. Since 2012. ESRD was due to "drugs", primarily used cocaine.  Has 3-5 year hx of HTN, no DM.  Gets HD on TTS schedule at [facility]. Originally from [place].   . Hypercalcemia   . Hyperpotassemia   . Tobacco abuse   . Protein-calorie malnutrition, severe   . Optic neuritis, left   . Hypertensive heart disease    Past Surgical History  Procedure Laterality Date  . Shoulder open rotator cuff repair Right   . Total knee arthroplasty Left   . Bascilic vein transposition  03/07/2012    Procedure: BASCILIC VEIN TRANSPOSITION;  Surgeon: Conrad Boyle, MD;  Location: [facility];  Service: Vascular;  Laterality: Left;  First Stage  . Insertion of dialysis catheter      right chest  . Bascilic vein transposition Left 05/31/2012    Procedure: BASCILIC VEIN TRANSPOSITION;  Surgeon: Conrad Oglala, MD;  Location: [facility];  Service: Vascular;  Laterality: Left;  Left 2nd Stage Basilic  Vein Transposition with gortex graft revision using 92mx10cm graft  . Shoulder open rotator cuff repair Right   . [name] capsule study N/A 08/27/2013    Procedure: [name] CAPSULE STUDY;  Surgeon: DMilus Banister MD;  Location: [facility]  Service: Endoscopy;  Laterality: N/A;   Family History  Problem Relation Age of Onset  . Diabetes Mother   . Hypertension Mother   . Stroke Mother   . Kidney failure Mother   . Cancer Father    History   Social History  . Marital Status: Single    Spouse Name: N/A    Number of Children: 1  . Years of Education: N/A   Occupational History  .  Unemployed    Social History Main Topics  . Smoking status: Current Every Day Smoker -- 0.25 packs/day for 25 years    Types: Cigarettes  . Smokeless tobacco: Never Used  . Alcohol Use: No     Comment: 11/09/2012 "been stopped drinking 1-2 yr ago"  . Drug Use: Yes    Special: "Crack" cocaine, Marijuana     Comment: Prior crack cocaine, marijuana use  . Sexual Activity: Yes    Birth Control/ Protection: None   Other Topics Concern  . Not on file   Social History Narrative    Physical Exam: Filed Vitals:   04/21/14 0215  BP: 137/44  Pulse: 111  Temp: 98.0  Resp: 18  SpO2: 94% on RA, 98% on 2L [place].  Physical Exam  Constitutional: [name] oriented to person, place, and time. No distress.  Cachectic, puffy face.  HENT:  Head: Normocephalic and atraumatic.  Eyes: Conjunctivae and EOM are normal. Pupils are equal, round, and reactive to light. No scleral icterus.  Neck: Normal range of motion. Neck supple.  Cardiovascular: Regular rhythm, normal heart sounds and intact distal pulses.   Tachycardic.  Pulmonary/Chest: Effort normal. No respiratory distress. He has wheezes (Expiratory wheezing greater on left.). He has rales (Bilaterally at lung bases.). He exhibits tenderness (Left sided.).  Diffuse rhonchi, greater on L.  Abdominal: Soft. Bowel sounds are normal. He exhibits distension  (Mild.). He exhibits no mass. There is no tenderness. There is no rebound and no guarding.  Musculoskeletal: Normal range of motion. He exhibits no edema or tenderness.  Neurological: [name] alert and oriented to person, place, and time. No cranial nerve deficit. He exhibits normal muscle tone.  Skin: Skin is warm and dry. [name] not diaphoretic. No erythema.  Very dry, flaky skin.    Lab results: Basic Metabolic Panel:  Recent Labs  04/20/14 2303  NA 135  K 6.8*  CL 94*  CO2 17*  GLUCOSE 102*  BUN 155*  CREATININE 13.27*  CALCIUM 5.6*   Liver Function Tests:  Recent Labs  04/20/14 2303  AST 31  ALT 22  ALKPHOS 61  BILITOT 0.7  PROT 6.6  ALBUMIN 2.7*   CBC:  Recent Labs  04/20/14 2303  WBC 10.6*  NEUTROABS 9.4*  HGB 7.7*  HCT 23.7*  MCV 90.8  PLT 193    Imaging results:  Dg Chest Port 1 View  04/21/2014   CLINICAL DATA:  Cough and fever for 3 days.  EXAM: PORTABLE CHEST - 1 VIEW  COMPARISON:  04/05/2014  FINDINGS: Cardiac enlargement without vascular congestion. New development of airspace consolidation in the right lower lung and left mid lung most likely representing pneumonia. No blunting of costophrenic angles. No pneumothorax. Postoperative changes and degenerative change in the right shoulder.  IMPRESSION: Developing airspace consolidation in the right lower lung and left mid lung most likely represent pneumonia.   Electronically Signed   By: Lucienne Capers M.D.   On: 04/21/2014 01:16    Other results: EKG: normal sinus rhythm, prolonged QT interval, QTc 518, peaked T waves.  Assessment & Plan by Problem: Active Problems:   Hypertension   ESRD on hemodialysis   Hyperkalemia   Anemia   #HCAP with sepsis Recently hospitalized, now with cough, SOB, and RLL and LML opacities consistent with pneumonia.  2/4 SIRS with tachycardia and elevated WBC.  Started on Cefepime and vancomycin in the ER for HCAP coverage.  More tachycardic, but recently received  albuterol.  Potentially  viral with many sick contacts.  Low suspicion for PE, given evident pneumonia.  Chest pain reproducible on exam and worse with inspiration, so potentially related to pneumonia. -Admit to telemetry. -Continue vanc and cefepime. -Hold fluids given dialysis patient. -Respiratory virus panel, legionella and strep antigens. -Procalcitonin. -Cycle troponins. -Blood and sputum cultures. -UDS and HIV.  #ESRD on TThS HD ESRD thought to be due to cocaine use.  Last hemodialysis on Thursday, now with hyperkalemia and peaked T waves.  Received given kayexalate, insulin/glucose, albuterol, and sodium bicarb in the ER to bring down potassium, likely much lower now.  Will need dialysis tomorrow. -Nephrology consulted for hemodialysis. -They plan to dialyze in the morning. -Recheck EKG now, calcium gluconate if T waves still peaked. -Recheck K in morning. -Continue home Phoslo and Hectorol.  #Normocytic anemia Required multiple transfusion in past.  Extensive work-up recently, including recent hospitalization without finding source of GI bleeding and bone marrow biopsy with concern for B cell lymphoproliferative process, but oncology feels anemia is secondary to ESRD with some ongoing GI bleeding..  2 units of PRBCs ordered in the ER for Hgb 7.7. Patient says he feels fatigued, but most likely secondary to sepsis. -Will hold off on transfusion from now. -Continue to monitor CBC. -Continue home darbepoetin with dialysis weekly.  #HTN BP elevated in the ER. -Hold home hydralazine in the setting of sepsis.  #Seizure disorder Reported by patient, no recent seizures. -Continue home Keppra 1000 mg BID.Marland Kitchen  #Protein-calorie malnutrition -Continue home nutritional supplement.  #DVT prophylaxis -Heparin.  Dispo: Disposition is deferred at this time, awaiting improvement of current medical problems. Anticipated discharge in approximately 2-3 day(s).   The patient does have a  current PCP Windy Kalata, MD), therefore will not be require OPC follow-up after discharge.   The patient does have transportation limitations that hinder transportation to clinic appointments.   Signed:  Arman Filter, MD, PhD PGY-1 Internal Medicine Teaching Service Pager: [phone] 04/21/2014, 3:36 AM

## 2014-04-21 NOTE — Consult Note (Addendum)
Renal Service Consult Note [name]  [name] 04/21/2014 [name] D Requesting Physician:  [name]  Reason for Consult:  ESRD pt with PNA and high K HPI: The patient is a 59 y.o. year-old with hx of DM, HTN, drug abuse, ESRD and chronic anemia.  Presented to ED with 3d SOB, chest congestion, chills/subj fever. Last HD Thurs, had not missed any HD. Chest sore from coughing. Admitted for HCAP, CXR +RLL and LML opactities. On vanc and cefipime. K 6.8 in ED rec'd IV meds for high K.  Renal asked to see for HD. Repeat K 4.8 at 3 am.   Ptno c/o's, chest sore from coughing. No abd pain, n/v/d.    Chart review: 11/13 - SOB, vol excess, ESRD on HD, high K, DM2, tobacco/cocaine use 1/14 - anemia, Lower GIB,  HTNm, ESRD o nHD 2/14 - 2nd stage BVT admitted d/t snowstorm 7/14 - anemia, FOBT +, recent LGIB in Jan, hx hep C, ESRD, HTN > anemia multifactorial, GI blood loss, esrd, etc.  EGD and colon done in May 2012.  9/14 - Anemia, acuteon chornic , Hb 3.9 > BM bx showed possible B cell lymphoproliferative process. Pt set up for OP hem/onc appt.  Hemolysis labs werenegative. FOBT +, GI felt ptdid not need repeat w/u.  Transfused up to 9.8.  +TB test in [place], had bronch with neg AFB smears in late August. Repeated Quantiferon tb gold assay and was negative. 3/15 -  Hb 5.0 in ED, weak, fatigued > Onc recommeneded repeat BM bx d/t concern for follicular lymphoma. GI suggested OP capsule endo. Hb transfused up to 8. HD TTS, cocaine use, seizure d/o on Keppra 3/15 - readmitted for anemia with Hb 6.5 > rec'd one uint PRBC, GI called and rec'd OP f/u for capsule endo, pt encouraged to f/u w Onc MD, had missed f/u last time. ESRD on HD.  5/15 - anemia, weak , hb 6.2 > admitted, transfused 4u prbc. GI rec'd OP capsule endo. Pt said he would probably not go to the appt for capsule endo, then he said he would.  Onc rec'd max EPO.  ESRDon HD 10/3 - 01/27/14 > 3d L eye pain and swelling  > MRI showed optic perineuritis.  Autoimmune w/u was negative, treated with IV then po steroids.  Pain meds. Stop intranasal cocaine as have been reported to cause optic perineuritis in a case report. ESRD< DM, HTN 10/16 - 02/04/14 > HTN crisis, L eye optic perineuritis on pred, ESRD, DM 10/30 - 02/18/14 > SNF resident presented with missing HD x 1 week , chest pain , slight ^trop > cards consult no further w/u needed, HD per renal, DM, keppra, HTN urgency and meds adjusted.  12/18 - 04/07/14 > fatigue, low Hb at HD > Hb 6.1, FOBT +, transfused . Had capsule endo in May '15 w mild findings midsmall bowel not believed to be cause of anemia. Hb multifactorial. ESRD on HD. [name]    Past Medical History  Past Medical History  Diagnosis Date  . Hypertension   . Anemia, chronic disease     a. Capsule endoscopy reportedly negative 08/2013. b. seen by heme with concern for minor B cell population on BMB, being observed.  . Chronic diastolic CHF (congestive heart failure)     a. EF 60 - 65% per [place] echo 11/2011. b. Echo 02/2012: severe LVH, focal basal hypertrophy, EF 60-65%, mild Mr.  . Cocaine abuse     mentioned in notes from  [place]  . Hepatitis C antibody test positive     was HIV negative, 02/28/12  . Hepatitis B core antibody positive     03/01/10  . Positive QuantiFERON-TB Gold test     11/2011  . Helicobacter pylori gastritis     not defined if this was treated  . Polyp of colon, adenomatous     May 2012.  [name] in [place]  . Hematochezia   . Head injury, closed, with concussion   . History of blood transfusion   . Arthritis     "right shoulder" (11/09/2012)  . Seizure disorder     questionable history of - will need to clarify with PCP  . Headache(784.0)   . ESRD on hemodialysis since 2012    a. Since 2012. ESRD was due to "drugs", primarily used cocaine.  Has 3-5 year hx of HTN, no DM.  Gets HD on TTS schedule at [facility]. Originally from [place].   .  Hypercalcemia   . Hyperpotassemia   . Tobacco abuse   . Protein-calorie malnutrition, severe   . Optic neuritis, left   . Hypertensive heart disease    Past Surgical History  Past Surgical History  Procedure Laterality Date  . Shoulder open rotator cuff repair Right   . Total knee arthroplasty Left   . Bascilic vein transposition  03/07/2012    Procedure: BASCILIC VEIN TRANSPOSITION;  Surgeon: [name] Salamonia, MD;  Location: [facility];  Service: Vascular;  Laterality: Left;  First Stage  . Insertion of dialysis catheter      right chest  . Bascilic vein transposition Left 05/31/2012    Procedure: BASCILIC VEIN TRANSPOSITION;  Surgeon: [name] Clarence, MD;  Location: [facility];  Service: Vascular;  Laterality: Left;  Left 2nd Stage Basilic Vein Transposition with gortex graft revision using 22mmx10cm graft  . Shoulder open rotator cuff repair Right   . [name] capsule study N/A 08/27/2013    Procedure: [name] CAPSULE STUDY;  Surgeon: [name] Banister, MD;  Location: [facility];  Service: Endoscopy;  Laterality: N/A;   Family History  Family History  Problem Relation Age of Onset  . Diabetes Mother   . Hypertension Mother   . Stroke Mother   . Kidney failure Mother   . Cancer Father    Social History  reports that he has been smoking Cigarettes.  He has a 6.25 pack-year smoking history. He has never used smokeless tobacco. He reports that he uses illicit drugs ("Crack" cocaine and Marijuana). He reports that he does not drink alcohol. Allergies  Allergies  Allergen Reactions  . Reglan [Metoclopramide] Other (See Comments)    Tardive dyskinesia in 11/2011 in [name] medications Prior to Admission medications   Medication Sig Start Date End Date Taking? Authorizing Provider  calcium acetate (PHOSLO) 667 MG capsule Take 2,001 mg by mouth 3 (three) times daily with meals.    Yes Historical Provider, MD  Darbepoetin Alfa (ARANESP) 200 MCG/0.4ML SOSY injection Inject 0.4 mLs (200 mcg total)  into the vein every Tuesday with hemodialysis. 04/09/14  Yes [name] Bales, MD  doxercalciferol (HECTOROL) 4 MCG/2ML injection Inject 1.5 mLs (3 mcg total) into the vein Every Tuesday,Thursday,and Saturday with dialysis. 04/07/14  Yes [name] Bales, MD  hydrALAZINE (APRESOLINE) 100 MG tablet Take 100 mg by mouth 2 (two) times daily. DON'T TAKE ON DIALYSIS DAYS   Yes Historical Provider, MD  levETIRAcetam (KEPPRA) 1000 MG tablet Take 1,000 mg by mouth  2 (two) times daily.    Yes Historical Provider, MD  Nutritional Supplements (FEEDING SUPPLEMENT, NEPRO CARB STEADY,) LIQD Take 237 mLs by mouth 2 (two) times daily between meals. 04/07/14  Yes [name] Bales, MD   Liver Function Tests  Recent Labs Lab 04/20/14 2303  AST 31  ALT 22  ALKPHOS 61  BILITOT 0.7  PROT 6.6  ALBUMIN 2.7*   No results for input(s): LIPASE, AMYLASE in the last 168 hours. CBC  Recent Labs Lab 04/20/14 2303  WBC 10.6*  NEUTROABS 9.4*  HGB 7.7*  HCT 23.7*  MCV 90.8  PLT 244   Basic Metabolic Panel  Recent Labs Lab 04/20/14 2303 04/21/14 0250  NA 135 140  K 6.8* 4.8  CL 94* 99  CO2 17* 12*  GLUCOSE 102* 75  BUN 155* 160*  CREATININE 13.27* 13.08*  CALCIUM 5.6* 5.4*    Filed Vitals:   04/21/14 0452 04/21/14 0515 04/21/14 0545 04/21/14 0658  BP: 90/55 160/38 156/54 166/56  Pulse: 110 108 107 106  Temp:    97.7 F (36.5 C)  TempSrc:    Oral  Resp: 26   20  Height:    5\' 9"  (1.753 m)  Weight:    70.943 kg (156 lb 6.4 oz)  SpO2: 98%  95% 90%   Exam Alert, looks tired, coughing occ, no distress No rash, cyanosis or gangrene Sclera anicteric, throat clear No jvd , slight bruits bilat Tachy, RRR no MRG, displaced PMI laterally Abd soft, NTND, no ascites or HSM Normal GU No LE edema, joint effusion or wounds Neuro is alert, Ox 3, nf  HD: TTS AF 4h   65kg   2/2.25 [place]  No heparin  L AVF Aranesp 200/wk,  Hectorol 3 ug TIW, Venofer 50/ wk Hb 9.1, tsat 24%, pth 514^,Ca 8.2, P  6.5  Assessment: 1. Cough / SOB/ chest pain - PNA by CXR, on emp abx 2. ESRD on HD 3. Hyperkalemia 4. Anemia, chronic and multifactorial. Extensive w/u in last few years 5. Seizure d/o 6. Hx optic perineuritis earlier this year - treated w steroids 7. Hx drug abuse 8. Homelessness - stays at [facility] today  Kelly Splinter MD (pgr) [phone]    ([phone] 04/21/2014, 7:15 AM

## 2014-04-22 ENCOUNTER — Inpatient Hospital Stay (HOSPITAL_COMMUNITY): Payer: Medicare Other

## 2014-04-22 DIAGNOSIS — R778 Other specified abnormalities of plasma proteins: Secondary | ICD-10-CM | POA: Diagnosis present

## 2014-04-22 DIAGNOSIS — G244 Idiopathic orofacial dystonia: Secondary | ICD-10-CM

## 2014-04-22 DIAGNOSIS — R0602 Shortness of breath: Secondary | ICD-10-CM | POA: Insufficient documentation

## 2014-04-22 DIAGNOSIS — R7989 Other specified abnormal findings of blood chemistry: Secondary | ICD-10-CM

## 2014-04-22 DIAGNOSIS — I214 Non-ST elevation (NSTEMI) myocardial infarction: Secondary | ICD-10-CM

## 2014-04-22 LAB — CBC WITH DIFFERENTIAL/PLATELET
BASOS ABS: 0 10*3/uL (ref 0.0–0.1)
BASOS PCT: 0 % (ref 0–1)
Eosinophils Absolute: 0 10*3/uL (ref 0.0–0.7)
Eosinophils Relative: 0 % (ref 0–5)
HCT: 22.8 % — ABNORMAL LOW (ref 39.0–52.0)
HEMOGLOBIN: 7.6 g/dL — AB (ref 13.0–17.0)
Lymphocytes Relative: 9 % — ABNORMAL LOW (ref 12–46)
Lymphs Abs: 0.5 10*3/uL — ABNORMAL LOW (ref 0.7–4.0)
MCH: 28.9 pg (ref 26.0–34.0)
MCHC: 33.3 g/dL (ref 30.0–36.0)
MCV: 86.7 fL (ref 78.0–100.0)
MONOS PCT: 8 % (ref 3–12)
Monocytes Absolute: 0.5 10*3/uL (ref 0.1–1.0)
NEUTROS ABS: 5 10*3/uL (ref 1.7–7.7)
Neutrophils Relative %: 83 % — ABNORMAL HIGH (ref 43–77)
Platelets: 192 10*3/uL (ref 150–400)
RBC: 2.63 MIL/uL — ABNORMAL LOW (ref 4.22–5.81)
RDW: 15.4 % (ref 11.5–15.5)
WBC: 6.1 10*3/uL (ref 4.0–10.5)

## 2014-04-22 LAB — PHOSPHORUS: Phosphorus: 6.6 mg/dL — ABNORMAL HIGH (ref 2.3–4.6)

## 2014-04-22 LAB — BASIC METABOLIC PANEL
ANION GAP: 15 (ref 5–15)
BUN: 51 mg/dL — ABNORMAL HIGH (ref 6–23)
CO2: 27 mmol/L (ref 19–32)
Calcium: 6.7 mg/dL — ABNORMAL LOW (ref 8.4–10.5)
Chloride: 99 mEq/L (ref 96–112)
Creatinine, Ser: 7.05 mg/dL — ABNORMAL HIGH (ref 0.50–1.35)
GFR calc Af Amer: 9 mL/min — ABNORMAL LOW (ref >90)
GFR, EST NON AFRICAN AMERICAN: 8 mL/min — AB (ref >90)
Glucose, Bld: 72 mg/dL (ref 70–99)
Potassium: 4.5 mmol/L (ref 3.5–5.1)
Sodium: 141 mmol/L (ref 135–145)

## 2014-04-22 LAB — TROPONIN I
Troponin I: 0.76 ng/mL (ref <0.031)
Troponin I: 0.9 ng/mL (ref <0.031)
Troponin I: 0.9 ng/mL (ref <0.031)

## 2014-04-22 LAB — CALCIUM, IONIZED: CALCIUM ION: 0.62 mmol/L — AB (ref 1.12–1.32)

## 2014-04-22 MED ORDER — GUAIFENESIN-CODEINE 100-10 MG/5ML PO SOLN: 5.0000 mL | Freq: Four times a day (QID) | ORAL | Status: DC | PRN

## 2014-04-22 MED ORDER — VANCOMYCIN HCL IN DEXTROSE 750-5 MG/150ML-% IV SOLN
750.0000 mg | INTRAVENOUS | Status: DC
  Administered 2014-04-23: 750 mg via INTRAVENOUS
  Filled 2014-04-22 (×2): qty 150

## 2014-04-22 MED ORDER — DEXTROSE 5 % IV SOLN
2.0000 g | INTRAVENOUS | Status: DC
  Administered 2014-04-23 – 2014-04-25 (×2): 2 g via INTRAVENOUS
  Filled 2014-04-22 (×3): qty 2

## 2014-04-22 MED ORDER — RENA-VITE PO TABS
1.0000 | ORAL_TABLET | Freq: Every day | ORAL | Status: DC
  Administered 2014-04-22: 1 via ORAL
  Filled 2014-04-22: qty 1

## 2014-04-22 MED ORDER — LEVETIRACETAM 500 MG PO TABS
500.0000 mg | ORAL_TABLET | ORAL | Status: DC
  Administered 2014-04-25: 500 mg via ORAL
  Filled 2014-04-22 (×2): qty 1

## 2014-04-22 MED ORDER — LEVETIRACETAM 500 MG PO TABS
1000.0000 mg | ORAL_TABLET | Freq: Every day | ORAL | Status: DC
  Administered 2014-04-22 – 2014-04-24 (×2): 1000 mg via ORAL
  Filled 2014-04-22 (×3): qty 2

## 2014-04-22 NOTE — Progress Notes (Signed)
Pt muscle twitching has improved since this morning. Pt still continues to have twitching upon arousal. Pt states he needs rest, pt also is still having trouble keeping nasal cannula on with sats dropping into the 70s. Pt refused mask. Will continue to monitor. Etta Quill, RN

## 2014-04-22 NOTE — Progress Notes (Signed)
PT Cancellation Note  Patient Details Name: Dearis Danis MRN: [id number] DOB: 1955/08/06   Cancelled Treatment:    Reason Eval/Treat Not Completed: Other (comment) (Medical issues early am and pt refusing at 1200.  )Will return tomorrow.  Thanks.   Irwin Brakeman F 04/22/2014, 12:04 PM  Shah Insley,PT Acute Rehabilitation [phone] (pager)

## 2014-04-22 NOTE — Progress Notes (Signed)
Pt is on 3L 02. Pt takes nasal cannula on and off, sats dropping into the 60s. Pt advised to keep oxygen on. Will continue to monitor. Etta Quill, RN
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
## 2014-04-22 NOTE — Progress Notes (Signed)
Pt is having facial twitching, no other complaints, neuro exam complete. MD notified. Will continue to monitor

## 2014-04-22 NOTE — Progress Notes (Signed)
OT Cancellation Note  Patient Details Name: [name] MRN: [id number] DOB: 1955/06/29   Cancelled Treatment:    Reason Eval/Treat Not Completed: Medical issues which prohibited therapy. Pt with troponin at 0.9.   Benito Mccreedy OTR/L [phone] 04/22/2014, 1:06 PM

## 2014-04-22 NOTE — Consult Note (Signed)
CARDIOLOGY CONSULT NOTE   Patient ID: [name] MRN: [id number] DOB/AGE: 1955/10/03 59 y.o.  Admit date: 04/21/2014  Primary Physician   Windy Kalata, MD Primary Cardiologist   Dr. Tamala Julian Reason for Consultation   Elevated Troponin  QQI:[id number] [name] is a 59 y.o. male with no history of CAD. He has a history of history of polysubstance abuse including cocaine/THC/tobacco, ESRD on HD T/Th/Sat, HTN, anemia of chronic disease (abnormal BMB 2015 being monitored by heme, negative capsule endo 10/2013), hepatitis C, DM, optic neuritis, severe LVH on echo 2013, and prior noncompliance.    He was seen by Dr. Tamala Julian 02/16/2014 for troponin 0.33 in the setting of missing HD and coming in with SOB/HTN. He was having no symptoms to suggest angina and no further workup was indicated.  He was admitted to the hospital 12/18-12/20 weakness, felt secondary to normocytic anemia. He was discharged to a facility. His cardiac enzymes were not checked during that admission.  He was admitted 01/03 with shortness of breath, fatigue, productive cough, fever, chills and chest tightness. His troponins were elevated and cardiology was asked to evaluate him. His initial potassium was 6.8 and his hemoglobin was 7.7. His chest x-ray showed pneumonia, HCAP.  His troponins were cycled and were elevated and cardiology was asked to evaluate him. Upon conversation with the patient, he is agitated and anxious about problems with speech and a jaw tremor that is possibly secondary to low calcium levels. He denies chest pain. He denies shortness of breath. [name] oriented to name and after some thought, remembers the day of the week. He keeps repeating the phrase "help me".  Past Medical History  Diagnosis Date  . Hypertension   . Anemia, chronic disease     a. Capsule endoscopy reportedly negative 08/2013. b. seen by heme with concern for minor B cell population on BMB, being observed.  . Chronic diastolic CHF  (congestive heart failure)     a. EF 60 - 65% per [place] echo 11/2011. b. Echo 02/2012: severe LVH, focal basal hypertrophy, EF 60-65%, mild Mr.  . Cocaine abuse     mentioned in notes from [place]  . Hepatitis C antibody test positive     was HIV negative, 02/28/12  . Hepatitis B core antibody positive     03/01/10  . Positive QuantiFERON-TB Gold test     11/2011  . Helicobacter pylori gastritis     not defined if this was treated  . Polyp of colon, adenomatous     May 2012.  Dr Trenton Founds in [place]  . Hematochezia   . Head injury, closed, with concussion   . History of blood transfusion   . Arthritis     "right shoulder" (11/09/2012)  . Seizure disorder     questionable history of - will need to clarify with PCP  . Headache(784.0)   . ESRD on hemodialysis since 2012    a. Since 2012. ESRD was due to "drugs", primarily used cocaine.  Has 3-5 year hx of HTN, no DM.  Gets HD on TTS schedule at [facility]. Originally from [place].   . Hypercalcemia   . Hyperpotassemia   . Tobacco abuse   . Protein-calorie malnutrition, severe   . Optic neuritis, left   . Hypertensive heart disease      Past Surgical History  Procedure Laterality Date  . Shoulder open rotator cuff repair Right   . Total knee arthroplasty Left   . Bascilic vein  transposition  03/07/2012    Procedure: BASCILIC VEIN TRANSPOSITION;  Surgeon: Conrad [place], MD;  Location: [facility];  Service: Vascular;  Laterality: Left;  First Stage  . Insertion of dialysis catheter      right chest  . Bascilic vein transposition Left 05/31/2012    Procedure: BASCILIC VEIN TRANSPOSITION;  Surgeon: Conrad [place], MD;  Location: [facility];  Service: Vascular;  Laterality: Left;  Left 2nd Stage Basilic Vein Transposition with gortex graft revision using 4mmx10cm graft  . Shoulder open rotator cuff repair Right   . [name] capsule study N/A 08/27/2013    Procedure: [name] CAPSULE STUDY;  Surgeon: Milus Banister, MD;  Location: [facility];  Service: Endoscopy;  Laterality: N/A;    Allergies  Allergen Reactions  . Reglan [Metoclopramide] Other (See Comments)    Tardive dyskinesia in 11/2011 in [facility] [place]   I have reviewed the patient's current medications . amLODipine  5 mg Oral Daily  . calcium acetate  2,001 mg Oral TID WC  . carvedilol  6.25 mg Oral BID WC  . [START ON 04/23/2014] Darbepoetin Alfa  200 mcg Intravenous Q Tue-HD  . [START ON 04/23/2014] doxercalciferol  3 mcg Intravenous Q T,Th,Sa-HD  . feeding supplement (NEPRO CARB STEADY)  237 mL Oral BID BM  . heparin  5,000 Units Subcutaneous 3 times per day  . levETIRAcetam  1,000 mg Oral Daily  . [START ON 04/23/2014] levETIRAcetam  500 mg Oral Q T,Th,Sa-HD  . multivitamin  1 tablet Oral QHS  . sodium chloride  3 mL Intravenous Q12H     sodium chloride, sodium chloride, sodium chloride, sodium chloride, sodium chloride, sodium chloride, sodium chloride, sodium chloride, acetaminophen, guaiFENesin-codeine, heparin, hydrALAZINE, HYDROcodone-acetaminophen, lidocaine (PF), lidocaine-prilocaine, ondansetron (ZOFRAN) IV, pentafluoroprop-tetrafluoroeth, promethazine  Prior to Admission medications   Medication Sig Start Date End Date Taking? Authorizing Provider  calcium acetate (PHOSLO) 667 MG capsule Take 2,001 mg by mouth 3 (three) times daily with meals.    Yes Historical Provider, MD  Darbepoetin Alfa (ARANESP) 200 MCG/0.4ML SOSY injection Inject 0.4 mLs (200 mcg total) into the vein every Tuesday with hemodialysis. 04/09/14  Yes Jones Bales, MD  doxercalciferol (HECTOROL) 4 MCG/2ML injection Inject 1.5 mLs (3 mcg total) into the vein Every Tuesday,Thursday,and Saturday with dialysis. 04/07/14  Yes Jones Bales, MD  hydrALAZINE (APRESOLINE) 100 MG tablet Take 100 mg by mouth 2 (two) times daily. DON'T TAKE ON DIALYSIS DAYS   Yes Historical Provider, MD  levETIRAcetam (KEPPRA) 1000 MG tablet Take 1,000 mg by mouth 2 (two) times daily.    Yes Historical  Provider, MD  Nutritional Supplements (FEEDING SUPPLEMENT, NEPRO CARB STEADY,) LIQD Take 237 mLs by mouth 2 (two) times daily between meals. 04/07/14  Yes Jones Bales, MD     History   Social History  . Marital Status: Single    Spouse Name: N/A    Number of Children: 1  . Years of Education: N/A   Occupational History  . Unemployed    Social History Main Topics  . Smoking status: Current Every Day Smoker -- 0.25 packs/day for 25 years    Types: Cigarettes  . Smokeless tobacco: Never Used  . Alcohol Use: No     Comment: 11/09/2012 "been stopped drinking 1-2 yr ago"  . Drug Use: Yes    Special: "Crack" cocaine, Marijuana     Comment: Prior crack cocaine, marijuana use  . Sexual Activity: Yes    Birth Control/ Protection: None  Other Topics Concern  . Not on file   Social History Narrative    Family Status  Relation Status Death Age  . Mother Deceased   . Father Deceased    Family History  Problem Relation Age of Onset  . Diabetes Mother   . Hypertension Mother   . Stroke Mother   . Kidney failure Mother   . Cancer Father      ROS:  Full 14 point review of systems complete and found to be negative unless listed above.  Physical Exam: Blood pressure 155/64, pulse 79, temperature 98.8 F (37.1 C), temperature source Oral, resp. rate 13, height 5\' 9"  (1.753 m), weight 149 lb 14.6 oz (68 kg), SpO2 99 %.  General: Well developed, well nourished, male with anxiety, moderate distress Head: Eyes PERRLA, No xanthomas.   Normocephalic and atraumatic, oropharynx without edema or exudate. Dentition: Poor Lungs: Bibasilar [place] Heart: HRRR S1 S2, no rub/gallop, no murmur. pulses are 2+ right upper extrem, decreased in both LE, and LUE (has HD graft) Neck: No carotid bruits. No lymphadenopathy.  JVD elevated, L>R. Abdomen: Bowel sounds present, abdomen soft and non-tender without masses or hernias noted. Msk:  No spine or cva tenderness. No weakness, no joint deformities  or effusions. Extremities: No clubbing or cyanosis. No edema.  Neuro: Alert and oriented X 3. No focal deficits noted. Psych:  Good affect, responds appropriately Skin: No rashes or lesions noted.  Labs:   Lab Results  Component Value Date   WBC 6.1 04/22/2014   HGB 7.6* 04/22/2014   HCT 22.8* 04/22/2014   MCV 86.7 04/22/2014   PLT 192 04/22/2014    Recent Labs Lab 04/20/14 2303  04/22/14 0346  NA 135  < > 141  K 6.8*  < > 4.5  CL 94*  < > 99  CO2 17*  < > 27  BUN 155*  < > 51*  CREATININE 13.27*  < > 7.05*  CALCIUM 5.6*  < > 6.7*  PROT 6.6  --   --   BILITOT 0.7  --   --   ALKPHOS 61  --   --   ALT 22  --   --   AST 31  --   --   GLUCOSE 102*  < > 72  ALBUMIN 2.7*  --   --   < > = values in this interval not displayed. MAGNESIUM  Date Value Ref Range Status  04/21/2014 2.3 1.5 - 2.5 mg/dL Final    Recent Labs  04/21/14 0957 04/21/14 1600 04/21/14 2250 04/22/14 0845  TROPONINI 0.63* 0.76* 0.90* 0.90*   B NATRIURETIC PEPTIDE  Date Value Ref Range Status  04/21/2014 1641.4* 0.0 - 100.0 pg/mL Final    Comment:    Please note change in reference range.   Lab Results  Component Value Date   CHOL 130 04/21/2014   HDL 33* 04/21/2014   LDLCALC 78 04/21/2014   TRIG 93 04/21/2014   Drugs of Abuse     Component Value Date/Time   LABOPIA POSITIVE* 02/16/2014 0534   COCAINSCRNUR NONE DETECTED 02/16/2014 0534   LABBENZ NONE DETECTED 02/16/2014 0534   AMPHETMU NONE DETECTED 02/16/2014 0534   THCU NONE DETECTED 02/16/2014 0534   LABBARB NONE DETECTED 02/16/2014 0534     Echo: 03/03/2012 - Left ventricle: Wall thickness was increased in a pattern of severe LVH. There was focal basal hypertrophy. Systolic function was normal. The estimated ejection fraction was in the range of 60% to 65%.  There was an increased relative contribution of atrial contraction to ventricular filling. - Mitral valve: Mild regurgitation. - Left atrium: The atrium was  mildly dilated.  ECG:  04/21/2014 ST, LVH 110  Radiology:  Dg Chest Port 1 View 04/22/2014   CLINICAL DATA:  Short of breath  EXAM: PORTABLE CHEST - 1 VIEW  COMPARISON:  04/21/2014  FINDINGS: There is moderate cardiac enlargement. Diffuse bilateral airspace opacities are again identified and do not appear significantly improved from previous exam.  IMPRESSION: Moderate diffuse pulmonary edema.   Electronically Signed   By: Kerby Moors M.D.   On: 04/22/2014 08:20   Dg Chest Port 1 View 04/21/2014   CLINICAL DATA:  Cough and fever for 3 days.  EXAM: PORTABLE CHEST - 1 VIEW  COMPARISON:  04/05/2014  FINDINGS: Cardiac enlargement without vascular congestion. New development of airspace consolidation in the right lower lung and left mid lung most likely representing pneumonia. No blunting of costophrenic angles. No pneumothorax. Postoperative changes and degenerative change in the right shoulder.  IMPRESSION: Developing airspace consolidation in the right lower lung and left mid lung most likely represent pneumonia.   Electronically Signed   By: Lucienne Capers M.D.   On: 04/21/2014 01:16    ASSESSMENT AND PLAN:   The patient was seen today by Dr Johnsie Cancel, the patient evaluated and the data reviewed.  Principal Problem:   HCAP (healthcare-associated pneumonia) - per IM  Active Problems:   Elevated troponin - no real significance in the setting of an acute illness and missed dialysis appointments. He has/had significant electrolyte abnormalities, being managed by the internal medicine and nephrology teams. M.D. advise if 2-D echocardiogram or any further inpatient evaluation is needed. Otherwise, will arrange outpatient follow-up in the office with Dr. Tamala Julian or an associate.  Otherwise, per IM, Nephrology   Hypertension   ESRD on hemodialysis   Seizure disorder   Tobacco abuse   Chest pain   Hyperkalemia   Anemia   Hypocalcemia      Signed: Rosaria Ferries, PA-C 04/22/2014 11:39 AM [facility]  [phone]  Co-Sign MD  Patient examined chart reviewed.  Currently tremulous with severe nausea ? Due to hypocalcemia.  In setting of CRF and missed dialysis don't think troponin less than one is signficant.  No need for further inpatient cardiac w/u. Outpatient f/u [facility].  ECG nonspecific ST chagnes LVH.  Exam remarkable for chronically ill black male Tremulous with no seizures , dialysis fistula and SEM.  Not having any chest pain.  Jenkins Rouge

## 2014-04-22 NOTE — Progress Notes (Addendum)
Subjective:  Patient is reporting some nausea with vomiting overnight. Patient also reporting that he has been having shaking in his arms and in his mouth. States that he hasn't had these symptoms before. Patient denies any associated abdominal pain. Otherwise, patientreporting no complaints. Patient denying any chest pain or new dyspnea.  Objective: Vital signs in last 24 hours: Filed Vitals:   04/22/14 0400 04/22/14 0535 04/22/14 0605 04/22/14 0752  BP:  147/64 121/52   Pulse:  89 84   Temp: 98.9 F (37.2 C)   98.2 F (36.8 C)  TempSrc:    Oral  Resp:  15 13   Height:      Weight: 68 kg (149 lb 14.6 oz)     SpO2: 97% 93% 96% 96%   Weight change: 0.857 kg (1 lb 14.2 oz)  Intake/Output Summary (Last 24 hours) at 04/22/14 0805 Last data filed at 04/22/14 0500  Gross per 24 hour  Intake    680 ml  Output   3600 ml  Net  -2920 ml   Physical Exam: General: AA male, alert, cooperative, mild discomfort from nausea  HEENT: PERRL, EOMI. Moist mucus membranes,  Lungs: Mild bibasilar Rales, no wheezes, good air movement Heart: Tachycardic, no murmurs, gallops, or rubs Abdomen: Soft, non-tender, non-distended, BS + Extremities: No cyanosis, clubbing, or edema. Significant dry skin and flaking on LE's.  Neurologic: Alert & oriented x3, paroxysmal nondescript oral movements, tremulousness, cranial nerves II-XII grossly intact, 2+ DTR throughout, negative [place], no carpopedal spasms  Lab Results: Basic Metabolic Panel:  Recent Labs Lab 04/21/14 0250 04/21/14 0645 04/22/14 0346  NA 140  --  141  K 4.8  --  4.5  CL 99  --  99  CO2 12*  --  27  GLUCOSE 75  --  72  BUN 160*  --  51*  CREATININE 13.08*  --  7.05*  CALCIUM 5.4*  --  6.7*  MG  --  2.3  --   PHOS  --  8.7*  --    Liver Function Tests:  Recent Labs Lab 04/20/14 2303  AST 31  ALT 22  ALKPHOS 61  BILITOT 0.7  PROT 6.6  ALBUMIN 2.7*   CBC:  Recent Labs Lab 04/21/14 0645 04/22/14 0346  WBC 8.5  6.1  NEUTROABS 7.5 5.0  HGB 6.6* 7.6*  HCT 20.2* 22.8*  MCV 89.8 86.7  PLT 166 192   Cardiac Enzymes:  Recent Labs Lab 04/21/14 0957 04/21/14 1600 04/21/14 2250  TROPONINI 0.63* 0.76* 0.90*   Micro Results: Recent Results (from the past 240 hour(s))  MRSA PCR Screening     Status: None   Collection Time: 04/21/14  6:26 AM  Result Value Ref Range Status   MRSA by PCR NEGATIVE NEGATIVE Final    Comment:        The GeneXpert MRSA Assay (FDA approved for NASAL specimens only), is one component of a comprehensive MRSA colonization surveillance program. It is not intended to diagnose MRSA infection nor to guide or monitor treatment for MRSA infections.    Studies/Results: Dg Chest Port 1 View  04/21/2014   CLINICAL DATA:  Cough and fever for 3 days.  EXAM: PORTABLE CHEST - 1 VIEW  COMPARISON:  04/05/2014  FINDINGS: Cardiac enlargement without vascular congestion. New development of airspace consolidation in the right lower lung and left mid lung most likely representing pneumonia. No blunting of costophrenic angles. No pneumothorax. Postoperative changes and degenerative change in the right shoulder.  IMPRESSION: Developing airspace consolidation in the right lower lung and left mid lung most likely represent pneumonia.   Electronically Signed   By: Lucienne Capers M.D.   On: 04/21/2014 01:16   Medications: I have reviewed the patient's current medications. Scheduled Meds: . amLODipine  5 mg Oral Daily  . calcium acetate  2,001 mg Oral TID WC  . carvedilol  6.25 mg Oral BID WC  . [START ON 04/23/2014] Darbepoetin Alfa  200 mcg Intravenous Q Tue-HD  . [START ON 04/23/2014] doxercalciferol  3 mcg Intravenous Q T,Th,Sa-HD  . feeding supplement (NEPRO CARB STEADY)  237 mL Oral BID BM  . heparin  5,000 Units Subcutaneous 3 times per day  . levETIRAcetam  1,000 mg Oral Daily  . [START ON 04/23/2014] levETIRAcetam  500 mg Oral Q T,Th,Sa-HD  . multivitamin  1 tablet Oral QHS  . sodium  chloride  3 mL Intravenous Q12H   Continuous Infusions:  PRN Meds:.sodium chloride, sodium chloride, sodium chloride, sodium chloride, sodium chloride, sodium chloride, sodium chloride, sodium chloride, acetaminophen, heparin, hydrALAZINE, HYDROcodone-acetaminophen, lidocaine (PF), lidocaine-prilocaine, ondansetron (ZOFRAN) IV, pentafluoroprop-tetrafluoroeth, promethazine   Assessment/Plan:  Patient is a 59 year old male with a history of hypertension, anemia, end-stage renal disease on hemodialysis (Tuesday, Thursday, Saturday), heart failure with preserved ejection fraction, seizure disorder, polysubstance abuse who presented with healthcare associated pneumonia and hyperkalemia.  HCAP: Recently hospitalized and with regular exposure to dialysis center. Initially presented with cough, dyspnea, and chest x-ray suggestive of right lower lobe and left middle lobe infiltrates, now with improving dyspnea. Patient initially started on cefepime and vancomycin. Initial leukocytosis has resolved. Patient has remained afebrile. -Continue vancomycin and cefepime. Consider narrowing tomorrow given clinical stability. -Blood cultures sent -Sputum cultures not yet collected  NSTEMI: Troponin trending up to 0.9 from 0.76 yesterday. Troponins before that were 0.63 and 0.7. Patient denies any history of heart disease. Patient not currently reporting any chest pain or new dyspnea. Patient with end-stage renal disease, though troponins are trending up. EKG only remarkable for sinus tachycardia without any ST-T wave abnormalities. -Continue trending troponins -Consider cardiology consult  Hypocalcemia: Ionized calcium of 0.62 yesterday. Corrected calcium of 7.7 Prolonged QT interval of 506. Patient status post calcium gluconate during this hospitalization. Possible etiology of oral and upper extremity twitching.  -Repeat phosphorous today -Continue with phoslo as below.   Oral dystonia and tremulousness:  Possibly secondary to hypocalcemia as above, though there are no signs of tetany or hyperreflexia. No medications on patient's list associated with dystonia. No previous history of the same. Possible focal seizure. Most other etiologies would be chronic. -Treat hypocalcemia as above -Continue to monitor and consider neurology consult if there is no resolution.  ESRD on HD (TTS): ESRD thought to be due to cocaine use. Last hemodialysis on yesterday. -Renal consulted, dialysis per renal. -Continue home Phoslo and Hectorol  Normocytic anemia: Required multiple transfusion in past. Extensive work-up recently, including recent hospitalization without finding source of GI bleeding and bone marrow biopsy with concern for B cell lymphoproliferative process, but oncology feels anemia is secondary to ESRD with some ongoing GI bleeding. Hemoglobin trended up to 7.6 from 6.6 status post 1 unit of transfusion yesterday with hemodialysis. -Continue to monitor CBC -Continue Aranesp w/ HD  HTN: Normotensive currently.  -Resume home hydralazine  Seizure disorder: Stable. Reported by patient, no recent seizures. Hemodialysis dose of Keppra should be 1000 mg daily with 200-500 mg supplementation with hemodialysis. Patient has been on Keppra 1000 mg twice a day  at home. -Consider check of Keppra level -Reduce Keppra to appropriate hemodialysis levels as above.  DVT/PE PPx: Heparin [place]  Screening: -HIV nonreactive  Resolved: -Hyperkalemia  Dispo: Disposition is deferred at this time, awaiting improvement of current medical problems.  Anticipated discharge in approximately 1-2 day(s).   The patient does have a current PCP Windy Kalata, MD) and does need an [facility] hospital follow-up appointment after discharge.  The patient does not have transportation limitations that hinder transportation to clinic appointments.  .Services Needed at time of discharge: Y = Yes, Blank = No PT:   OT:   RN:     Equipment:   Other:     LOS: 1 day   Luan Moore, MD 04/22/2014, 8:05 AM

## 2014-04-22 NOTE — Progress Notes (Signed)
S:no SOB, appetite poor  + cough O:BP 121/52 mmHg  Pulse 84  Temp(Src) 98.9 F (37.2 C) (Oral)  Resp 13  Ht 5\' 9"  (1.753 m)  Wt 68 kg (149 lb 14.6 oz)  BMI 22.13 kg/m2  SpO2 96%  Intake/Output Summary (Last 24 hours) at 04/22/14 0721 Last data filed at 04/22/14 0500  Gross per 24 hour  Intake    680 ml  Output   3600 ml  Net  -2920 ml   Weight change: 0.857 kg (1 lb 14.2 oz) JJH:ERDEY and alert CVS: RRR Resp:bil crackles, rhonchi Abd:+ BS NTND Ext:no edema  L AVF + bruit NEURO: CNI, Ox3 no asterixis   . amLODipine  5 mg Oral Daily  . calcium acetate  2,001 mg Oral TID WC  . carvedilol  6.25 mg Oral BID WC  . [START ON 04/23/2014] Darbepoetin Alfa  200 mcg Intravenous Q Tue-HD  . [START ON 04/23/2014] doxercalciferol  3 mcg Intravenous Q T,Th,Sa-HD  . feeding supplement (NEPRO CARB STEADY)  237 mL Oral BID BM  . heparin  5,000 Units Subcutaneous 3 times per day  . levETIRAcetam  1,000 mg Oral Daily  . [START ON 04/23/2014] levETIRAcetam  500 mg Oral Q T,Th,Sa-HD  . sodium chloride  3 mL Intravenous Q12H   Dg Chest Port 1 View  04/21/2014   CLINICAL DATA:  Cough and fever for 3 days.  EXAM: PORTABLE CHEST - 1 VIEW  COMPARISON:  04/05/2014  FINDINGS: Cardiac enlargement without vascular congestion. New development of airspace consolidation in the right lower lung and left mid lung most likely representing pneumonia. No blunting of costophrenic angles. No pneumothorax. Postoperative changes and degenerative change in the right shoulder.  IMPRESSION: Developing airspace consolidation in the right lower lung and left mid lung most likely represent pneumonia.   Electronically Signed   By: Lucienne Capers M.D.   On: 04/21/2014 01:16   BMET    Component Value Date/Time   NA 141 04/22/2014 0346   NA 141 11/09/2013 0931   K 4.5 04/22/2014 0346   K 4.6 11/09/2013 0931   CL 99 04/22/2014 0346   CO2 27 04/22/2014 0346   CO2 32* 11/09/2013 0931   GLUCOSE 72 04/22/2014 0346   GLUCOSE 77  11/09/2013 0931   BUN 51* 04/22/2014 0346   BUN 28.5* 11/09/2013 0931   CREATININE 7.05* 04/22/2014 0346   CREATININE 6.6* 11/09/2013 0931   CALCIUM 6.7* 04/22/2014 0346   CALCIUM 9.4 11/09/2013 0931   GFRNONAA 8* 04/22/2014 0346   GFRAA 9* 04/22/2014 0346   CBC    Component Value Date/Time   WBC 6.1 04/22/2014 0346   WBC 4.0 11/09/2013 0931   RBC 2.63* 04/22/2014 0346   RBC 2.16* 04/06/2014 0106   RBC 5.05 11/09/2013 0931   HGB 7.6* 04/22/2014 0346   HGB 14.1 11/09/2013 0931   HCT 22.8* 04/22/2014 0346   HCT 45.2 11/09/2013 0931   PLT 192 04/22/2014 0346   PLT 246 11/09/2013 0931   MCV 86.7 04/22/2014 0346   MCV 89.4 11/09/2013 0931   MCH 28.9 04/22/2014 0346   MCH 27.9 11/09/2013 0931   MCHC 33.3 04/22/2014 0346   MCHC 31.2* 11/09/2013 0931   RDW 15.4 04/22/2014 0346   RDW 17.5* 11/09/2013 0931   LYMPHSABS 0.5* 04/22/2014 0346   LYMPHSABS 1.0 11/09/2013 0931   MONOABS 0.5 04/22/2014 0346   MONOABS 0.5 11/09/2013 0931   EOSABS 0.0 04/22/2014 0346   EOSABS 0.2 11/09/2013 0931  BASOSABS 0.0 04/22/2014 0346   BASOSABS 0.1 11/09/2013 0931     Assessment: 1. PNA 2. Hyperkalemia, resolved 3. Anemia, partly related to recurrent GI bleeds   Plan: 1. Cont AB 2. Add renavite 3. HD in AM   Darbi Chandran T

## 2014-04-23 LAB — BASIC METABOLIC PANEL
ANION GAP: 13 (ref 5–15)
Anion gap: 17 — ABNORMAL HIGH (ref 5–15)
BUN: 64 mg/dL — AB (ref 6–23)
BUN: 15 mg/dL (ref 6–23)
CALCIUM: 6.7 mg/dL — AB (ref 8.4–10.5)
CALCIUM: 9.8 mg/dL (ref 8.4–10.5)
CO2: 24 mmol/L (ref 19–32)
CO2: 27 mmol/L (ref 19–32)
CREATININE: 9.46 mg/dL — AB (ref 0.50–1.35)
Chloride: 96 mEq/L (ref 96–112)
Chloride: 98 mEq/L (ref 96–112)
Creatinine, Ser: 3.95 mg/dL — ABNORMAL HIGH (ref 0.50–1.35)
GFR calc Af Amer: 6 mL/min — ABNORMAL LOW (ref >90)
GFR, EST AFRICAN AMERICAN: 18 mL/min — AB (ref >90)
GFR, EST NON AFRICAN AMERICAN: 5 mL/min — AB (ref >90)
GFR, EST NON AFRICAN AMERICAN: 16 mL/min — AB (ref >90)
Glucose, Bld: 46 mg/dL — ABNORMAL LOW (ref 70–99)
Glucose, Bld: 90 mg/dL (ref 70–99)
POTASSIUM: 4.1 mmol/L (ref 3.5–5.1)
Potassium: 5.2 mmol/L — ABNORMAL HIGH (ref 3.5–5.1)
Sodium: 137 mmol/L (ref 135–145)
Sodium: 138 mmol/L (ref 135–145)

## 2014-04-23 LAB — CBC
HEMATOCRIT: 25.8 % — AB (ref 39.0–52.0)
HEMOGLOBIN: 8.2 g/dL — AB (ref 13.0–17.0)
MCH: 28.7 pg (ref 26.0–34.0)
MCHC: 31.8 g/dL (ref 30.0–36.0)
MCV: 90.2 fL (ref 78.0–100.0)
PLATELETS: 244 10*3/uL (ref 150–400)
RBC: 2.86 MIL/uL — ABNORMAL LOW (ref 4.22–5.81)
RDW: 15.7 % — ABNORMAL HIGH (ref 11.5–15.5)
WBC: 7.2 10*3/uL (ref 4.0–10.5)

## 2014-04-23 LAB — ALBUMIN: Albumin: 2.5 g/dL — ABNORMAL LOW (ref 3.5–5.2)

## 2014-04-23 MED ORDER — DOXERCALCIFEROL 4 MCG/2ML IV SOLN
INTRAVENOUS | Status: AC
  Administered 2014-04-23: 3 ug via INTRAVENOUS
  Filled 2014-04-23: qty 2

## 2014-04-23 MED ORDER — DARBEPOETIN ALFA 200 MCG/0.4ML IJ SOSY
PREFILLED_SYRINGE | INTRAMUSCULAR | Status: AC
  Administered 2014-04-23: 200 ug via INTRAVENOUS
  Filled 2014-04-23: qty 0.4

## 2014-04-23 NOTE — Procedures (Signed)
Pt seen on HD.  Ap 210 Vp 150  BFR 400. Tolerating HD well so far.

## 2014-04-23 NOTE — Progress Notes (Signed)
Patient ID: [name], male   DOB: 1956/02/23, 59 y.o.   MRN: [id number] Medicine attending: I examined this patient today together with with the resident team and I concur with the evaluation and management plan as outlined by resident physician Dr. Purcell Nails NGO. Atypical perioral muscle spasms have resolved. We have researched details related to possible seizure disorder and we have not been able to document the circumstances under which he was started on anticonvulsant drugs. We will consider an EEG. If this is normal then discontinue his Keppra. With respect to his presenting symptoms of cough and dyspnea and bilateral pulmonary infiltrates, he is responding to current antibiotic therapy. Antibiotic regimen will now be modified to single agent cefepime to be continued for a total of 10 days as an outpatient at time of dialysis treatments when he is otherwise stable for discharge.

## 2014-04-23 NOTE — Progress Notes (Signed)
PT Cancellation Note  Patient Details Name: [name] MRN: [id number] DOB: 16-Oct-1955   Cancelled Treatment:    Reason Eval/Treat Not Completed: Patient declined, no reason specified. When PT entered, pt immediately closed eyes and refused to acknowledge that PT was speaking to him. Encouraged participation but pt continued to ignore therapist. Will continue to attempt PT eval as schedule allows.    Rolinda Roan 04/23/2014, 2:30 PM   Rolinda Roan, PT, DPT Acute Rehabilitation Services Pager: [phone]

## 2014-04-23 NOTE — Progress Notes (Signed)
Pt asleep, has agreed to put 02 on. Pt refusing all evening medications. MD aware. Will continue to monitor.

## 2014-04-23 NOTE — Progress Notes (Signed)
PT Cancellation Note  Patient Details Name: [name] MRN: [id number] DOB: May 16, 1955   Cancelled Treatment:    Reason Eval/Treat Not Completed: Patient at procedure or test/unavailable (in HD.  Will check back as able.  Thanks.)   Irwin Brakeman F 04/23/2014, 10:42 AM  [facility] [phone] (pager)

## 2014-04-23 NOTE — Progress Notes (Signed)
Subjective:  Patient is overall quite somnolent upon interview this morning. Patient not reporting any acute issues this morning. Patient denies any chest pain or dyspnea.  Objective: Vital signs in last 24 hours: Filed Vitals:   04/22/14 2304 04/23/14 0020 04/23/14 0430 04/23/14 0526  BP: 167/64   182/67  Pulse: 82   84  Temp:  99.6 F (37.6 C) 97.9 F (36.6 C)   TempSrc:  Oral Oral   Resp: 14   18  Height:      Weight:   68.1 kg (150 lb 2.1 oz)   SpO2: 100% 100% 98% 98%   Weight change: -3.7 kg (-8 lb 2.5 oz)  Intake/Output Summary (Last 24 hours) at 04/23/14 0174 Last data filed at 04/22/14 2000  Gross per 24 hour  Intake    980 ml  Output      0 ml  Net    980 ml   Physical Exam: General: AA male, somnolent, cooperative  HEENT: PERRL, EOMI. Moist mucus membranes,  Lungs: Clear to auscultation bilaterally, no wheezes, good air movement Heart: Tachycardic, no murmurs, gallops, or rubs Abdomen: Soft, non-tender, non-distended, BS + Extremities: No cyanosis, clubbing, or edema. Significant dry skin and flaking on LE's.  Neurologic: Somnolent, paroxysmal nondescript oral movements tremulousness improved over the interval  Lab Results: Basic Metabolic Panel:  Recent Labs Lab 04/21/14 0645 04/22/14 0346 04/22/14 0845 04/23/14 0429  NA  --  141  --  137  K  --  4.5  --  5.2*  CL  --  99  --  96  CO2  --  27  --  24  GLUCOSE  --  72  --  46*  BUN  --  51*  --  64*  CREATININE  --  7.05*  --  9.46*  CALCIUM  --  6.7*  --  6.7*  MG 2.3  --   --   --   PHOS 8.7*  --  6.6*  --    Liver Function Tests:  Recent Labs Lab 04/20/14 2303  AST 31  ALT 22  ALKPHOS 61  BILITOT 0.7  PROT 6.6  ALBUMIN 2.7*   CBC:  Recent Labs Lab 04/21/14 0645 04/22/14 0346 04/23/14 0429  WBC 8.5 6.1 7.2  NEUTROABS 7.5 5.0  --   HGB 6.6* 7.6* 8.2*  HCT 20.2* 22.8* 25.8*  MCV 89.8 86.7 90.2  PLT 166 192 244   Cardiac Enzymes:  Recent Labs Lab 04/21/14 1600  04/21/14 2250 04/22/14 0845  TROPONINI 0.76* 0.90* 0.90*   Micro Results: Recent Results (from the past 240 hour(s))  MRSA PCR Screening     Status: None   Collection Time: 04/21/14  6:26 AM  Result Value Ref Range Status   MRSA by PCR NEGATIVE NEGATIVE Final    Comment:        The GeneXpert MRSA Assay (FDA approved for NASAL specimens only), is one component of a comprehensive MRSA colonization surveillance program. It is not intended to diagnose MRSA infection nor to guide or monitor treatment for MRSA infections.   Culture, blood (routine x 2) Call MD if unable to obtain prior to antibiotics being given     Status: None (Preliminary result)   Collection Time: 04/21/14  6:45 AM  Result Value Ref Range Status   Specimen Description BLOOD RIGHT HAND  Final   Special Requests BOTTLES DRAWN AEROBIC AND ANAEROBIC 10CC EA  Final   Culture   Final  BLOOD CULTURE RECEIVED NO GROWTH TO DATE CULTURE WILL BE HELD FOR 5 DAYS BEFORE ISSUING A FINAL NEGATIVE REPORT Performed at [facility]    Report Status PENDING  Incomplete  Culture, blood (routine x 2) Call MD if unable to obtain prior to antibiotics being given     Status: None (Preliminary result)   Collection Time: 04/21/14  7:00 AM  Result Value Ref Range Status   Specimen Description BLOOD RIGHT FOREARM  Final   Special Requests BOTTLES DRAWN AEROBIC AND ANAEROBIC 10CC EA  Final   Culture   Final           BLOOD CULTURE RECEIVED NO GROWTH TO DATE CULTURE WILL BE HELD FOR 5 DAYS BEFORE ISSUING A FINAL NEGATIVE REPORT Performed at [facility]    Report Status PENDING  Incomplete   Studies/Results: Dg Chest Port 1 View  04/22/2014   CLINICAL DATA:  Short of breath  EXAM: PORTABLE CHEST - 1 VIEW  COMPARISON:  04/21/2014  FINDINGS: There is moderate cardiac enlargement. Diffuse bilateral airspace opacities are again identified and do not appear significantly improved from previous exam.  IMPRESSION:  Moderate diffuse pulmonary edema.   Electronically Signed   By: Kerby Moors M.D.   On: 04/22/2014 08:20   Medications: I have reviewed the patient's current medications. Scheduled Meds: . amLODipine  5 mg Oral Daily  . calcium acetate  2,001 mg Oral TID WC  . carvedilol  6.25 mg Oral BID WC  . ceFEPime (MAXIPIME) IV  2 g Intravenous Q T,Th,Sa-HD  . Darbepoetin Alfa  200 mcg Intravenous Q Tue-HD  . doxercalciferol  3 mcg Intravenous Q T,Th,Sa-HD  . feeding supplement (NEPRO CARB STEADY)  237 mL Oral BID BM  . heparin  5,000 Units Subcutaneous 3 times per day  . levETIRAcetam  1,000 mg Oral Daily  . levETIRAcetam  500 mg Oral Q T,Th,Sa-HD  . multivitamin  1 tablet Oral QHS  . sodium chloride  3 mL Intravenous Q12H  . vancomycin  750 mg Intravenous Q T,Th,Sa-HD   Continuous Infusions:  PRN Meds:.sodium chloride, sodium chloride, sodium chloride, sodium chloride, sodium chloride, sodium chloride, sodium chloride, sodium chloride, acetaminophen, guaiFENesin-codeine, heparin, hydrALAZINE, HYDROcodone-acetaminophen, lidocaine (PF), lidocaine-prilocaine, ondansetron (ZOFRAN) IV, pentafluoroprop-tetrafluoroeth, promethazine   Assessment/Plan:  Patient is a 59 year old male with a history of hypertension, anemia, end-stage renal disease on hemodialysis (Tuesday, Thursday, Saturday), heart failure with preserved ejection fraction, seizure disorder, polysubstance abuse who presented with healthcare associated pneumonia, though complicated by demand ischemia, though now improved.  HCAP: Recently hospitalized and with regular exposure to dialysis center. Initially presented with cough, dyspnea, and chest x-ray suggestive of right lower lobe and left middle lobe infiltrates, now with improving dyspnea. Repeat chest x-ray yesterday showing moderate diffuse pulmonary edema. Patient has remained afebrile. No leukocytosis. Patient has remained clinically stable. -Discontinue vancomycin -Continue cefepime  for Pseudomonas coverage. Will plan on administering cefepime with dialysis sessions as an outpatient. -Blood cultures collected 04/21/2014 no growth to date -Sputum cultures not collected  Hypocalcemia: Corrected calcium still at 7.7. Patient with an EKG this morning with a QTC of 494.  -Continue with PhosLo as below. -Calcium bath with dialysis per nephrology. -We will repeat a calcium level this afternoon (after dialysis) and supplement as necessary.  Oral dystonia and tremulousness: Improved over the interval. Etiology is unclear though possibly secondary to hypocalcemia. However, there are no concurrent signs of tetany or hyperreflexia. Patient is also on Keppra which has a very rare  incidence of dyskinesias as an adverse effect. Please see below regarding further information on Keppra therapy. -Continue to treat hypocalcemia as above -Consider discontinuation of Keppra as below.  ESRD on HD (TTS): ESRD thought to be due to cocaine use. Patient is due for dialysis today. -Dialysis per renal. -Continue home Phoslo and Hectorol  Normocytic anemia: Required multiple transfusion in past. Extensive work-up recently, including recent hospitalization without finding source of GI bleeding and bone marrow biopsy with concern for B cell lymphoproliferative process, but oncology feels anemia is secondary to ESRD with some ongoing GI bleeding. Patient's hemoglobin now trended up to 8.2 from 7.6 yesterday. -Continue to monitor CBC -Continue Aranesp w/ HD  HTN: Patient's blood pressures have been elevated today with dialysis in the 096K to 383K systolic. Patient has refused medications 1 today, though will not state reason why he is refusing. -We will try to offer medications again: Amlodipine 5 mg, Coreg 6.25 mg twice a day, hydralazine 10 mg every 6 hours when necessary  Seizure disorder: Stable. Reported by patient, no recent seizures. Thorough review of the chart does not reveal any documentation  regarding the initiation of this therapy. Patient does not have any notes from neurology in our system. Patient initially came in on Keppra 1000 mg twice a day and was reduced to 1000 mg daily with 500 mg supplementation with hemodialysis. Patient is unclear as to the reason of starting his Keppra dose notes a prior history of traumatic head injury. -Keppra level check with morning labs -Consider discontinuation of Keppra.  DVT/PE PPx: Heparin [place]  Screening: -HIV nonreactive  Resolved: -Hyperkalemia -NSTEMI secondary to demand ischemia  Dispo: Disposition is deferred at this time, awaiting improvement of current medical problems.  Anticipated discharge in approximately 1-2 day(s).   The patient does have a current PCP Windy Kalata, MD) and does need an [facility] hospital follow-up appointment after discharge.  The patient does not have transportation limitations that hinder transportation to clinic appointments.  .Services Needed at time of discharge: Y = Yes, Blank = No PT:   OT:   RN:   Equipment:   Other:     LOS: 2 days   Luan Moore, MD 04/23/2014, 6:33 AM

## 2014-04-23 NOTE — Progress Notes (Signed)
Pt offered daily medications, pt refused x2. MD notified and are aware. Etta Quill, RN

## 2014-04-23 NOTE — Progress Notes (Addendum)
Pt refusing most of medications upon arrival back to the floor.  Will continue to educate on the importance of the medications and the importance of wearing oxygen. Etta Quill, RN

## 2014-04-23 NOTE — Progress Notes (Signed)
Pt refusing to wear oxygen. Pt educated on the importance of wearing oxygen. Sats currently in the 80s. Etta Quill, RN

## 2014-04-23 NOTE — Care Management Note (Signed)
    Page 1 of 1   04/25/2014     2:45:37 PM CARE MANAGEMENT NOTE 04/25/2014  Patient:  [name],[name]   Account Number:  [id number]  Date Initiated:  04/23/2014  Documentation initiated by:  [name],[name]  Subjective/Objective Assessment:   Pt admitted for HCAP- Started vancomycin and cefepime, HD dosing.     Action/Plan:   Per progression: pt he is staying at [facility]. CM will discuss disposition needs with pt.   Anticipated DC Date:  04/25/2014   Anticipated DC Plan:  HOMELESS [place]  CM consult      Choice offered to / List presented to:             Status of service:  Completed, signed off Medicare Important Message given?  YES (If response is "NO", the following Medicare IM given date fields will be blank) Date Medicare IM given:  04/22/2014 Medicare IM given by:  [name],[name] Date Additional Medicare IM given:  04/25/2014 Additional Medicare IM given by:  [name] [name]  Discharge Disposition:  [place]  Per UR Regulation:  Reviewed for med. necessity/level of care/duration of stay  If discussed at [place] of Stay Meetings, dates discussed:   04/25/2014    Comments:   04-25-14 [name], [name],[name] [phone] Pt was discussed in the [place] meeting today. Pt is agreeaable to SNF placement at [facility]. CSW assisting with disposition needs. No needs from CM at this time.   04-24-14 1450 [name], [name] [phone] Pt plan for HD in am and d/c afterwards to [facility]. No further needs from CM at this time.

## 2014-04-23 NOTE — Progress Notes (Signed)
OT cancellation    04/23/14 1049  OT Visit Information  Last OT Received On 04/23/14  Reason Eval/Treat Not Completed Patient at procedure or test/ unavailable. Pt at HD.   Roseanne Reno, OTR/L [phone]

## 2014-04-23 NOTE — Progress Notes (Signed)
S:no SOB, appetite still poor ,  Cough a little better O:BP 182/67 mmHg  Pulse 84  Temp(Src) 97.9 F (36.6 C) (Oral)  Resp 18  Ht 5\' 9"  (1.753 m)  Wt 68.1 kg (150 lb 2.1 oz)  BMI 22.16 kg/m2  SpO2 98%  Intake/Output Summary (Last 24 hours) at 04/23/14 0727 Last data filed at 04/22/14 2000  Gross per 24 hour  Intake    980 ml  Output      0 ml  Net    980 ml   Weight change: -3.7 kg (-8 lb 2.5 oz) WJX:BJYNW and alert CVS: RRR Resp:bil crackles, rhonchi Abd:+ BS NTND Ext:no edema  L AVF + bruit NEURO: CNI, Ox3 no asterixis   . amLODipine  5 mg Oral Daily  . calcium acetate  2,001 mg Oral TID WC  . carvedilol  6.25 mg Oral BID WC  . ceFEPime (MAXIPIME) IV  2 g Intravenous Q [name],Th,Sa-HD  . Darbepoetin Alfa  200 mcg Intravenous Q Tue-HD  . doxercalciferol  3 mcg Intravenous Q [name],Th,Sa-HD  . feeding supplement (NEPRO CARB STEADY)  237 mL Oral BID BM  . heparin  5,000 Units Subcutaneous 3 times per day  . levETIRAcetam  1,000 mg Oral Daily  . levETIRAcetam  500 mg Oral Q [name],Th,Sa-HD  . multivitamin  1 tablet Oral QHS  . sodium chloride  3 mL Intravenous Q12H  . vancomycin  750 mg Intravenous Q [name],Th,Sa-HD   Dg Chest Port 1 View  04/22/2014   CLINICAL DATA:  Short of breath  EXAM: PORTABLE CHEST - 1 VIEW  COMPARISON:  04/21/2014  FINDINGS: There is moderate cardiac enlargement. Diffuse bilateral airspace opacities are again identified and do not appear significantly improved from previous exam.  IMPRESSION: Moderate diffuse pulmonary edema.   Electronically Signed   By: Kerby Moors M.D.   On: 04/22/2014 08:20   BMET    Component Value Date/Time   NA 137 04/23/2014 0429   NA 141 11/09/2013 0931   K 5.2* 04/23/2014 0429   K 4.6 11/09/2013 0931   CL 96 04/23/2014 0429   CO2 24 04/23/2014 0429   CO2 32* 11/09/2013 0931   GLUCOSE 46* 04/23/2014 0429   GLUCOSE 77 11/09/2013 0931   BUN 64* 04/23/2014 0429   BUN 28.5* 11/09/2013 0931   CREATININE 9.46* 04/23/2014 0429   CREATININE 6.6* 11/09/2013 0931   CALCIUM 6.7* 04/23/2014 0429   CALCIUM 9.4 11/09/2013 0931   GFRNONAA 5* 04/23/2014 0429   GFRAA 6* 04/23/2014 0429   CBC    Component Value Date/Time   WBC 7.2 04/23/2014 0429   WBC 4.0 11/09/2013 0931   RBC 2.86* 04/23/2014 0429   RBC 2.16* 04/06/2014 0106   RBC 5.05 11/09/2013 0931   HGB 8.2* 04/23/2014 0429   HGB 14.1 11/09/2013 0931   HCT 25.8* 04/23/2014 0429   HCT 45.2 11/09/2013 0931   PLT 244 04/23/2014 0429   PLT 246 11/09/2013 0931   MCV 90.2 04/23/2014 0429   MCV 89.4 11/09/2013 0931   MCH 28.7 04/23/2014 0429   MCH 27.9 11/09/2013 0931   MCHC 31.8 04/23/2014 0429   MCHC 31.2* 11/09/2013 0931   RDW 15.7* 04/23/2014 0429   RDW 17.5* 11/09/2013 0931   LYMPHSABS 0.5* 04/22/2014 0346   LYMPHSABS 1.0 11/09/2013 0931   MONOABS 0.5 04/22/2014 0346   MONOABS 0.5 11/09/2013 0931   EOSABS 0.0 04/22/2014 0346   EOSABS 0.2 11/09/2013 0931   BASOSABS 0.0 04/22/2014 0346  BASOSABS 0.1 11/09/2013 0931     Assessment: 1. PNA 2. Hyperkalemia, resolved 3. Anemia, partly related to recurrent GI bleeds, Hg better   Plan: 1. HD today.  Will dialyze on higher ca bath 2. Check albumin to get better idea of true calcium level 3. Could switch to PO AB  [name]

## 2014-04-24 DIAGNOSIS — J811 Chronic pulmonary edema: Secondary | ICD-10-CM

## 2014-04-24 DIAGNOSIS — D509 Iron deficiency anemia, unspecified: Secondary | ICD-10-CM

## 2014-04-24 DIAGNOSIS — D508 Other iron deficiency anemias: Secondary | ICD-10-CM | POA: Insufficient documentation

## 2014-04-24 DIAGNOSIS — I248 Other forms of acute ischemic heart disease: Secondary | ICD-10-CM

## 2014-04-24 LAB — COMPREHENSIVE METABOLIC PANEL
ALBUMIN: 2.4 g/dL — AB (ref 3.5–5.2)
ALT: 16 U/L (ref 0–53)
AST: 24 U/L (ref 0–37)
Alkaline Phosphatase: 62 U/L (ref 39–117)
Anion gap: 10 (ref 5–15)
BUN: 26 mg/dL — AB (ref 6–23)
CO2: 29 mmol/L (ref 19–32)
Calcium: 8.5 mg/dL (ref 8.4–10.5)
Chloride: 97 mEq/L (ref 96–112)
Creatinine, Ser: 5.69 mg/dL — ABNORMAL HIGH (ref 0.50–1.35)
GFR calc Af Amer: 12 mL/min — ABNORMAL LOW (ref >90)
GFR calc non Af Amer: 10 mL/min — ABNORMAL LOW (ref >90)
Glucose, Bld: 125 mg/dL — ABNORMAL HIGH (ref 70–99)
Potassium: 4.1 mmol/L (ref 3.5–5.1)
Sodium: 136 mmol/L (ref 135–145)
TOTAL PROTEIN: 6.6 g/dL (ref 6.0–8.3)
Total Bilirubin: 0.6 mg/dL (ref 0.3–1.2)

## 2014-04-24 LAB — CBC
HCT: 29.5 % — ABNORMAL LOW (ref 39.0–52.0)
Hemoglobin: 9.3 g/dL — ABNORMAL LOW (ref 13.0–17.0)
MCH: 28.9 pg (ref 26.0–34.0)
MCHC: 31.5 g/dL (ref 30.0–36.0)
MCV: 91.6 fL (ref 78.0–100.0)
Platelets: 270 10*3/uL (ref 150–400)
RBC: 3.22 MIL/uL — ABNORMAL LOW (ref 4.22–5.81)
RDW: 15.2 % (ref 11.5–15.5)
WBC: 4.9 10*3/uL (ref 4.0–10.5)

## 2014-04-24 MED ORDER — HYDRALAZINE HCL 50 MG PO TABS
100.0000 mg | ORAL_TABLET | Freq: Two times a day (BID) | ORAL | Status: DC
  Administered 2014-04-24: 100 mg via ORAL
  Filled 2014-04-24: qty 2

## 2014-04-24 NOTE — Progress Notes (Signed)
S: Feels better O:BP 154/67 mmHg  Pulse 77  Temp(Src) 97.4 F (36.3 C) (Oral)  Resp 22  Ht 5\' 9"  (1.753 m)  Wt 65.772 kg (145 lb)  BMI 21.40 kg/m2  SpO2 99%  Intake/Output Summary (Last 24 hours) at 04/24/14 9702 Last data filed at 04/23/14 1800  Gross per 24 hour  Intake    480 ml  Output   3844 ml  Net  -3364 ml   Weight change: -0.8 kg (-1 lb 12.2 oz) OVZ:CHYIF and alert CVS: RRR Resp:bil crackles Abd:+ BS NTND Ext:no edema  L AVF + bruit NEURO: CNI, Ox3 no asterixis   . amLODipine  5 mg Oral Daily  . calcium acetate  2,001 mg Oral TID WC  . carvedilol  6.25 mg Oral BID WC  . ceFEPime (MAXIPIME) IV  2 g Intravenous Q [name],Th,Sa-HD  . Darbepoetin Alfa  200 mcg Intravenous Q Tue-HD  . doxercalciferol  3 mcg Intravenous Q [name],Th,Sa-HD  . feeding supplement (NEPRO CARB STEADY)  237 mL Oral BID BM  . heparin  5,000 Units Subcutaneous 3 times per day  . levETIRAcetam  1,000 mg Oral Daily  . levETIRAcetam  500 mg Oral Q [name],Th,Sa-HD  . multivitamin  1 tablet Oral QHS  . sodium chloride  3 mL Intravenous Q12H   Dg Chest Port 1 View  04/22/2014   CLINICAL DATA:  Short of breath  EXAM: PORTABLE CHEST - 1 VIEW  COMPARISON:  04/21/2014  FINDINGS: There is moderate cardiac enlargement. Diffuse bilateral airspace opacities are again identified and do not appear significantly improved from previous exam.  IMPRESSION: Moderate diffuse pulmonary edema.   Electronically Signed   By: Kerby Moors M.D.   On: 04/22/2014 08:20   BMET    Component Value Date/Time   NA 136 04/24/2014 0402   NA 141 11/09/2013 0931   K 4.1 04/24/2014 0402   K 4.6 11/09/2013 0931   CL 97 04/24/2014 0402   CO2 29 04/24/2014 0402   CO2 32* 11/09/2013 0931   GLUCOSE 125* 04/24/2014 0402   GLUCOSE 77 11/09/2013 0931   BUN 26* 04/24/2014 0402   BUN 28.5* 11/09/2013 0931   CREATININE 5.69* 04/24/2014 0402   CREATININE 6.6* 11/09/2013 0931   CALCIUM 8.5 04/24/2014 0402   CALCIUM 9.4 11/09/2013 0931   GFRNONAA  10* 04/24/2014 0402   GFRAA 12* 04/24/2014 0402   CBC    Component Value Date/Time   WBC 4.9 04/24/2014 0402   WBC 4.0 11/09/2013 0931   RBC 3.22* 04/24/2014 0402   RBC 2.16* 04/06/2014 0106   RBC 5.05 11/09/2013 0931   HGB 9.3* 04/24/2014 0402   HGB 14.1 11/09/2013 0931   HCT 29.5* 04/24/2014 0402   HCT 45.2 11/09/2013 0931   PLT 270 04/24/2014 0402   PLT 246 11/09/2013 0931   MCV 91.6 04/24/2014 0402   MCV 89.4 11/09/2013 0931   MCH 28.9 04/24/2014 0402   MCH 27.9 11/09/2013 0931   MCHC 31.5 04/24/2014 0402   MCHC 31.2* 11/09/2013 0931   RDW 15.2 04/24/2014 0402   RDW 17.5* 11/09/2013 0931   LYMPHSABS 0.5* 04/22/2014 0346   LYMPHSABS 1.0 11/09/2013 0931   MONOABS 0.5 04/22/2014 0346   MONOABS 0.5 11/09/2013 0931   EOSABS 0.0 04/22/2014 0346   EOSABS 0.2 11/09/2013 0931   BASOSABS 0.0 04/22/2014 0346   BASOSABS 0.1 11/09/2013 0931     Assessment: 1. PNA 2. Hyperkalemia, resolved 3. Anemia, partly related to recurrent GI bleeds, Hg better  4. HTN   Plan: 1.  Will switch BP meds back to hydralazine as this is what he was on at home.  Pulled 4.4L with HD last night.  Cont to lower DW 2. Plan HD in AM 3. Could Change to po AB  [name]

## 2014-04-24 NOTE — Evaluation (Signed)
Physical Therapy Evaluation Patient Details Name: [name] MRN: [id number] DOB: May 24, 1955 Today's Date: 04/24/2014   History of Present Illness  Pt is a 59 year old male admitted due to complaints of fatigue, SOB, chest tightness, productive cough and fever.  PMH includes HTN, normocytic anemia, dCHF, seizure disorder, ESRD on HD, and malnutrition.     Clinical Impression  Pt willing to get up and ambulate with PT today. Pt limited by complaint of feeling lightheaded upon standing. PT took pt's BP which was 129/57. Pt needed to sit down before attempting to stand again. Ultimately, pt able to stand and ambulate to sink where he washed his face before ambulating to recliner where he sat. Pt educated on importance of sitting up to allow his body to adjust to being upright. Pt stated he has mostly been laying in the bed since admission. PT took pt off supplemental O2 to transition to sitting EOB and pt's SpO2 dropped to 86%. Pt was placed on 2L O2 prior to attempting standing. Pt will continue to benefit acutely from ambulation and proper use of straight cane.     Follow Up Recommendations SNF    Equipment Recommendations  Rolling walker with 5" wheels    Recommendations for Other Services       Precautions / Restrictions Precautions Precautions: Fall Restrictions Weight Bearing Restrictions: No      Mobility  Bed Mobility Overal bed mobility: Needs Assistance Bed Mobility: Supine to Sit     Supine to sit: Supervision     General bed mobility comments: Pt sitting EOB with PT when OT arrived.   Transfers Overall transfer level: Needs assistance Equipment used: Straight cane Transfers: Sit to/from Stand Sit to Stand: Supervision         General transfer comment: Pt supervision for transition to standing from lowered bed. Pt with loss of balance but reported feeling lightheaded and needed to sit down upon reaching standing.   Ambulation/Gait Ambulation/Gait  assistance: Min assist Ambulation Distance (Feet): 15 Feet Assistive device: Straight cane Gait Pattern/deviations: Step-through pattern;Decreased step length - right;Decreased step length - left;Decreased stride length Gait velocity: Decreased Gait velocity interpretation: Below normal speed for age/gender General Gait Details: Pt unsteady with ambulation but without loss of balance. Pt ambulated to sink and then around bed to end in recliner. Pt with decreased use of cane in tight spaces, and will benefit from education on proper use of SPC cane for safety. Unable to ambulate farther despite pt willing to ambulate in halls due to complaint of lightheadedness.   Stairs            Wheelchair Mobility    Modified Rankin (Stroke Patients Only)       Balance                                             Pertinent Vitals/Pain Pain Assessment: Faces Faces Pain Scale: Hurts a little bit Pain Location: HA Pain Descriptors / Indicators: Discomfort Pain Intervention(s): Limited activity within patient's tolerance;Monitored during session    Home Living Family/patient expects to be discharged to:: Shelter/Homeless Living Arrangements: Alone               Additional Comments: Pt stated that he is currently stating at [employer]. Pt stated that he owns a [facility] which he does not use all the time. Pt stated  he has no stairs to negotiate at [facility] as there is an Media planner and stated he may be able to receive some help as there are so many people there.     Prior Function Level of Independence: Independent with assistive device(s)         Comments: Pt reports use of SPC at times     Hand Dominance   Dominant Hand: Right    Extremity/Trunk Assessment   Upper Extremity Assessment: Generalized weakness           Lower Extremity Assessment: Defer to PT evaluation      Cervical / Trunk Assessment: Normal  Communication    Communication: No difficulties  Cognition Arousal/Alertness: Awake/alert Behavior During Therapy: WFL for tasks assessed/performed Overall Cognitive Status: Within Functional Limits for tasks assessed                      General Comments General comments (skin integrity, edema, etc.):   Pt desated to 86% while sitting on EOB on RA. Increased O2 to 2L and was able to maintain SpO2 of 95%.      Exercises        Assessment/Plan    PT Assessment Patient needs continued PT services  PT Diagnosis Difficulty walking   PT Problem List Decreased strength;Decreased activity tolerance;Decreased balance;Decreased mobility;Decreased knowledge of use of DME;Decreased safety awareness;Cardiopulmonary status limiting activity  PT Treatment Interventions DME instruction;Gait training;Functional mobility training;Therapeutic activities;Therapeutic exercise;Balance training;Patient/family education   PT Goals (Current goals can be found in the Care Plan section) Acute Rehab PT Goals Patient Stated Goal: None stated PT Goal Formulation: With patient Time For Goal Achievement: 05/08/14 Potential to Achieve Goals: Good    Frequency Min 3X/week   Barriers to discharge Other (comment) (Limited support and no stable home environment ) Pt living at homeless shelter, without support system and necessary assistance.     Co-evaluation PT/OT/SLP Co-Evaluation/Treatment: Yes Reason for Co-Treatment: Other (comment) (Pt c/o lightheadedness limiting ability for prolonged therapy) PT goals addressed during session: Mobility/safety with mobility;Proper use of DME;Balance OT goals addressed during session: ADL's and self-care       End of Session Equipment Utilized During Treatment: Gait belt Activity Tolerance: Other (comment) (Limited due to complaint of lightheadedness, limiting OOB) Patient left: in chair;with call bell/phone within reach;with chair alarm set Nurse Communication: Other  (comment) (EKG lines not reading properly)         Time: 5916-3846 PT Time Calculation (min) (ACUTE ONLY): 31 min   Charges:   PT Evaluation $Initial PT Evaluation Tier I: 1 Procedure PT Treatments $Gait Training: 8-22 mins   PT G CodesIrwin Brakeman F SPT 04/24/2014, 12:33 PM  Jearld Shines, SPT  Acute Rehabilitation [phone] [facility] Acute Rehabilitation [phone] (pager)

## 2014-04-24 NOTE — Progress Notes (Signed)
Pt refusing to wear heart monitor, he also adamently requested to have his IV removed. MD text paged and made aware. RN coming onshift made aware also.
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
## 2014-04-24 NOTE — Progress Notes (Signed)
SATURATION QUALIFICATIONS: (This note is used to comply with regulatory documentation for home oxygen)  Patient Saturations on Room Air at Rest = 86%  Patient Saturations on Room Air while Ambulating = Unable to test due to drop with RA at rest  Patient Saturations on 2 Liters of oxygen while Ambulating = 95%  Please briefly explain why patient needs home oxygen: Pt desaturated to 86% on RA while sitting EOB at rest. Pt put on 2L and immediately increased to 95% and remained there during ambulation.

## 2014-04-24 NOTE — Progress Notes (Signed)
Occupational Therapy Evaluation Patient Details Name: [name] MRN: [id number] DOB: 04/28/1955 Today's Date: 04/24/2014    History of Present Illness Pt is a 59 year old male admitted due to complaints of fatigue, SOB, chest tightness, productive cough and fever.    Clinical Impression   PTA pt lived in homeless shelter [facility]) and was independent with occasional use of SPC. Pt currently requires Supervision for ADLs and min (A) for functional mobility due to c/o lightheadedness when standing. Feel that pt will require 24/7 Supervision and would benefit from SNF at d/c for safety. Pt will benefit from acute OT to address functional mobility and safety with ADLs.     Follow Up Recommendations  SNF;Supervision/Assistance - 24 hour    Equipment Recommendations  None recommended by OT    Recommendations for Other Services       Precautions / Restrictions Precautions Precautions: Fall Restrictions Weight Bearing Restrictions: No      Mobility Bed Mobility Overal bed mobility: Needs Assistance Bed Mobility: Supine to Sit     Supine to sit: Supervision     General bed mobility comments: Pt sitting EOB with PT when OT arrived.   Transfers Overall transfer level: Needs assistance Equipment used: Straight cane Transfers: Sit to/from Stand Sit to Stand: Supervision         General transfer comment: Pt supervision for transition to standing from lowered bed. Pt with loss of balance but reported feeling lightheaded and needed to sit down upon reaching standing.          ADL Overall ADL's : Needs assistance/impaired Eating/Feeding: Independent;Sitting   Grooming: Supervision/safety;Standing;Wash/dry face   Upper Body Bathing: Supervision/ safety;Sitting;Set up   Lower Body Bathing: Supervison/ safety;Set up;Sit to/from stand   Upper Body Dressing : Set up;Supervision/safety;Sitting   Lower Body Dressing: Supervision/safety;Set up;Sit to/from stand Lower  Body Dressing Details (indicate cue type and reason): pt able to reach Bil LEs to adjust socks Toilet Transfer: Supervision/safety;Ambulation [facility])   Toileting- Clothing Manipulation and Hygiene: Supervision/safety;Sit to/from stand       Functional mobility during ADLs: Minimal assistance;Cane General ADL Comments: Pt with no difficulty reaching LB for ADLs, however c/o lightheadedness when standing which limits safety with functional mobility. Feel that SNF would be safest option due to limited mobility.      Vision  Pt reports no change from baseline.                    Perception Perception Perception Tested?: No   Praxis Praxis Praxis tested?: Within functional limits    Pertinent Vitals/Pain Pain Assessment: Faces Faces Pain Scale: Hurts a little bit Pain Location: HA Pain Descriptors / Indicators: Discomfort Pain Intervention(s): Limited activity within patient's tolerance;Monitored during session     Hand Dominance Right   Extremity/Trunk Assessment Upper Extremity Assessment Upper Extremity Assessment: Generalized weakness   Lower Extremity Assessment Lower Extremity Assessment: Defer to PT evaluation   Cervical / Trunk Assessment Cervical / Trunk Assessment: Normal   Communication Communication Communication: No difficulties   Cognition Arousal/Alertness: Awake/alert Behavior During Therapy: WFL for tasks assessed/performed Overall Cognitive Status: Within Functional Limits for tasks assessed                                Home Living Family/patient expects to be discharged to:: Shelter/Homeless Living Arrangements: Alone  Additional Comments: Pt stated that he is currently stating at [facility] homeless shelter. Pt stated that he owns a [facility] which he does not use all the time. Pt stated he has no stairs to negotiate at [facility] as there is an Media planner and stated he may be able to receive  some help as there are so many people there.       Prior Functioning/Environment Level of Independence: Independent with assistive device(s)        Comments: Pt reports use of SPC at times    OT Diagnosis: Generalized weakness   OT Problem List: Decreased strength;Decreased activity tolerance;Impaired balance (sitting and/or standing);Decreased safety awareness   OT Treatment/Interventions: Self-care/ADL training;Therapeutic exercise;Energy conservation;DME and/or AE instruction;Therapeutic activities;Patient/family education;Balance training    OT Goals(Current goals can be found in the care plan section) Acute Rehab OT Goals Patient Stated Goal: None stated OT Goal Formulation: With patient Time For Goal Achievement: 05/08/14 Potential to Achieve Goals: Good  OT Frequency: Min 1X/week   Barriers to D/C: Decreased caregiver support          Co-evaluation PT/OT/SLP Co-Evaluation/Treatment: Yes Reason for Co-Treatment: Other (comment) (Pt c/o lightheadedness limiting ability for prolonged therap) PT goals addressed during session: Mobility/safety with mobility;Proper use of DME;Balance OT goals addressed during session: ADL's and self-care      End of Session Equipment Utilized During Treatment: Gait belt;Other (comment);Oxygen (2L ; SPC) Nurse Communication: Other (comment) (pt leads not reading, but connected well)  Activity Tolerance: Other (comment) (limited by c/o lightheadedness) Patient left: in chair;with call bell/phone within reach;with chair alarm set   Time: [phone] OT Time Calculation (min): 11 min Charges:  OT General Charges $OT Visit: 1 Procedure OT Evaluation $Initial OT Evaluation Tier I: 1 Procedure G-Codes:    Juluis Rainier May 24, 2014, 11:16 AM   Cyndie Chime, OTR/L Occupational Therapist [phone] (pager)

## 2014-04-24 NOTE — Progress Notes (Signed)
Subjective:  Patient was refusing medications throughout yesterday afternoon but has been more compliant since last night. Patient states that he is feeling a little better since yesterday. Otherwise, patient is only complaining that he is not receiving french fries with his lunch.  Objective: Vital signs in last 24 hours: Filed Vitals:   04/23/14 2000 04/23/14 2346 04/24/14 0400 04/24/14 0822  BP: 223/74 136/52 154/67 155/64  Pulse: 90 78 77 73  Temp: 98.2 F (36.8 C) 98.9 F (37.2 C) 97.4 F (36.3 C) 97.5 F (36.4 C)  TempSrc: Oral Oral Oral Oral  Resp: 18 18 22    Height:      Weight:   65.772 kg (145 lb)   SpO2: 100% 92% 99% 95%   Weight change: -0.8 kg (-1 lb 12.2 oz)  Intake/Output Summary (Last 24 hours) at 04/24/14 1122 Last data filed at 04/24/14 1610  Gross per 24 hour  Intake    240 ml  Output   3844 ml  Net  -3604 ml   Physical Exam: General: AA male, somnolent, cooperative  HEENT: PERRL, EOMI. Moist mucus membranes,  Lungs: Bibasilar Rales, no wheezes, good air movement Heart: Regular rate and rhythm, no murmurs, gallops, or rubs Abdomen: Soft, non-tender, non-distended, BS + Extremities: No cyanosis, clubbing, or edema. Significant dry skin and flaking on LE's.  Neurologic: alert and oriented 3, no more oral dystonia  Lab Results: Basic Metabolic Panel:  Recent Labs Lab 04/21/14 0645  04/22/14 0845  04/23/14 1522 04/24/14 0402  NA  --   < >  --   < > 138 136  K  --   < >  --   < > 4.1 4.1  CL  --   < >  --   < > 98 97  CO2  --   < >  --   < > 27 29  GLUCOSE  --   < >  --   < > 90 125*  BUN  --   < >  --   < > 15 26*  CREATININE  --   < >  --   < > 3.95* 5.69*  CALCIUM  --   < >  --   < > 9.8 8.5  MG 2.3  --   --   --   --   --   PHOS 8.7*  --  6.6*  --   --   --   < > = values in this interval not displayed. Liver Function Tests:  Recent Labs Lab 04/20/14 2303 04/23/14 0429 04/24/14 0402  AST 31  --  24  ALT 22  --  16  ALKPHOS 61   --  62  BILITOT 0.7  --  0.6  PROT 6.6  --  6.6  ALBUMIN 2.7* 2.5* 2.4*   CBC:  Recent Labs Lab 04/21/14 0645 04/22/14 0346 04/23/14 0429 04/24/14 0402  WBC 8.5 6.1 7.2 4.9  NEUTROABS 7.5 5.0  --   --   HGB 6.6* 7.6* 8.2* 9.3*  HCT 20.2* 22.8* 25.8* 29.5*  MCV 89.8 86.7 90.2 91.6  PLT 166 192 244 270   Cardiac Enzymes:  Recent Labs Lab 04/21/14 1600 04/21/14 2250 04/22/14 0845  TROPONINI 0.76* 0.90* 0.90*   Micro Results: Recent Results (from the past 240 hour(s))  MRSA PCR Screening     Status: None   Collection Time: 04/21/14  6:26 AM  Result Value Ref Range Status   MRSA by PCR NEGATIVE  NEGATIVE Final    Comment:        The GeneXpert MRSA Assay (FDA approved for NASAL specimens only), is one component of a comprehensive MRSA colonization surveillance program. It is not intended to diagnose MRSA infection nor to guide or monitor treatment for MRSA infections.   Culture, blood (routine x 2) Call MD if unable to obtain prior to antibiotics being given     Status: None (Preliminary result)   Collection Time: 04/21/14  6:45 AM  Result Value Ref Range Status   Specimen Description BLOOD RIGHT HAND  Final   Special Requests BOTTLES DRAWN AEROBIC AND ANAEROBIC 10CC EA  Final   Culture   Final           BLOOD CULTURE RECEIVED NO GROWTH TO DATE CULTURE WILL BE HELD FOR 5 DAYS BEFORE ISSUING A FINAL NEGATIVE REPORT Performed at [facility]    Report Status PENDING  Incomplete  Culture, blood (routine x 2) Call MD if unable to obtain prior to antibiotics being given     Status: None (Preliminary result)   Collection Time: 04/21/14  7:00 AM  Result Value Ref Range Status   Specimen Description BLOOD RIGHT FOREARM  Final   Special Requests BOTTLES DRAWN AEROBIC AND ANAEROBIC 10CC EA  Final   Culture   Final           BLOOD CULTURE RECEIVED NO GROWTH TO DATE CULTURE WILL BE HELD FOR 5 DAYS BEFORE ISSUING A FINAL NEGATIVE REPORT Performed at [facility]    Report Status PENDING  Incomplete   Studies/Results: No results found. Medications: I have reviewed the patient's current medications. Scheduled Meds: . calcium acetate  2,001 mg Oral TID WC  . ceFEPime (MAXIPIME) IV  2 g Intravenous Q T,Th,Sa-HD  . Darbepoetin Alfa  200 mcg Intravenous Q Tue-HD  . doxercalciferol  3 mcg Intravenous Q T,Th,Sa-HD  . feeding supplement (NEPRO CARB STEADY)  237 mL Oral BID BM  . heparin  5,000 Units Subcutaneous 3 times per day  . hydrALAZINE  100 mg Oral BID  . levETIRAcetam  1,000 mg Oral Daily  . levETIRAcetam  500 mg Oral Q T,Th,Sa-HD  . multivitamin  1 tablet Oral QHS  . sodium chloride  3 mL Intravenous Q12H   Continuous Infusions:  PRN Meds:.acetaminophen, guaiFENesin-codeine, HYDROcodone-acetaminophen, promethazine   Assessment/Plan:  Patient is a 59 year old male with a history of hypertension, anemia, end-stage renal disease on hemodialysis (Tuesday, Thursday, Saturday), heart failure with preserved ejection fraction, seizure disorder, polysubstance abuse who originally presented with healthcare associated pneumonia, complicated by demand ischemia, and now with some fluid overload on hemodialysis.  HCAP: Patient admitted with recent exposure to a healthcare setting through dialysis. Patient initially presented with cough and dyspnea along with chest x-ray with right lower lobe and left middle lobe infiltrates, now with symptoms improved. Serial x-ray is now more remarkable for pulmonary edema which is consistent with physical exam findings of bibasilar rales. Patient remains on 2 L nasal cannula which is above his baseline which is no oxygen requirement. -Continue cefepime for Pseudomonas coverage with plans of administration as an outpatient with dialysis sessions (planned end date of 05/01/14). -Blood cultures collected 04/21/2014 with no growth to date -Sputum cultures not collected  ESRD on HD (TTS): ESRD thought to be due to  cocaine use. Patient received dialysis yesterday and is 3.3 L net negative over the last 24 hours. However, patient with bibasilar Rales with corroborating evidence from prior  chest x-ray. Nephrology planning for a lower dry weight with dialysis tomorrow. -Dialysis per renal. -Continue home Phoslo and Hectorol  Normocytic anemia: Required multiple transfusion in past. Extensive work-up recently, including recent hospitalization without finding source of GI bleeding and bone marrow biopsy with concern for B cell lymphoproliferative process, but oncology feels anemia is secondary to ESRD with some ongoing GI bleeding. Patient's hemoglobin has continued to trend up, 9.3 from 8.2 yesterday. -Continue to monitor CBC -Continue Aranesp w/ HD  HTN: Patient with severe elevations in blood pressure throughout yesterday afternoon though has been much better controlled overnight in the 408X to 448J systolic. These elevations were likely in the setting of refusal of medications. -Discontinue amlodipine and Coreg which were started during this hospitalization in the context of demand ischemia -Resume home hydralazine 100 mg twice a day. -Appreciate nephrology recommendations.  Seizure disorder: Stable. Thorough review of the chart does not reveal any documentation regarding the initiation of this therapy. Patient's home dose of Keppra 1000 mg twice a day was reduced to 1000 mg daily with 500 mg supplementation with hemodialysis as this is the appropriate renal dosing. Patient is unclear as to the reason of starting his Keppra dose notes a prior history of traumatic head injury. -Keppra level pending -Consider discontinuation of Keppra.  DVT/PE PPx: Heparin [place]  Screening: -HIV nonreactive  Resolved: -Hyperkalemia -NSTEMI secondary to demand ischemia -Hypocalcemia -Oral dystonia and tremulousness  Dispo: Disposition is deferred at this time, awaiting improvement of current medical problems.  Anticipated  discharge in approximately 1 day(s).   The patient does have a current PCP Windy Kalata, MD) and does need an [facility] hospital follow-up appointment after discharge.  The patient does not have transportation limitations that hinder transportation to clinic appointments.  .Services Needed at time of discharge: Y = Yes, Blank = No PT:   OT:   RN:   Equipment:   Other:     LOS: 3 days   Luan Moore, MD 04/24/2014, 11:22 AM

## 2014-04-24 NOTE — Progress Notes (Signed)
ANTIBIOTIC CONSULT NOTE - Follow up  Pharmacy Consult for Cefepime Indication: pneumonia  Allergies  Allergen Reactions  . Reglan [Metoclopramide] Other (See Comments)    Tardive dyskinesia in 11/2011 in [place]    Patient Measurements: Height: 5\' 9"  (175.3 cm) Weight: 145 lb (65.772 kg) IBW/kg (Calculated) : 70.7 Adjusted Body Weight: n/a   Vital Signs: Temp: 97.9 F (36.6 C) (01/06 1155) Temp Source: Oral (01/06 1155) BP: 106/42 mmHg (01/06 1155) Pulse Rate: 73 (01/06 1155) Intake/Output from previous day: 01/05 0701 - 01/06 0700 In: 480 [P.O.:480] Out: 3844  Intake/Output from this shift: Total I/O In: 240 [P.O.:240] Out: -   Labs:  Recent Labs  04/22/14 0346 04/23/14 0429 04/23/14 1522 04/24/14 0402  WBC 6.1 7.2  --  4.9  HGB 7.6* 8.2*  --  9.3*  PLT 192 244  --  270  CREATININE 7.05* 9.46* 3.95* 5.69*   Estimated Creatinine Clearance: 13.5 mL/min (by C-G formula based on Cr of 5.69). No results for input(s): VANCOTROUGH, VANCOPEAK, VANCORANDOM, GENTTROUGH, GENTPEAK, GENTRANDOM, TOBRATROUGH, TOBRAPEAK, TOBRARND, AMIKACINPEAK, AMIKACINTROU, AMIKACIN in the last 72 hours.   Microbiology: Recent Results (from the past 720 hour(s))  MRSA PCR Screening     Status: None   Collection Time: 04/21/14  6:26 AM  Result Value Ref Range Status   MRSA by PCR NEGATIVE NEGATIVE Final    Comment:        The GeneXpert MRSA Assay (FDA approved for NASAL specimens only), is one component of a comprehensive MRSA colonization surveillance program. It is not intended to diagnose MRSA infection nor to guide or monitor treatment for MRSA infections.   Culture, blood (routine x 2) Call MD if unable to obtain prior to antibiotics being given     Status: None (Preliminary result)   Collection Time: 04/21/14  6:45 AM  Result Value Ref Range Status   Specimen Description BLOOD RIGHT HAND  Final   Special Requests BOTTLES DRAWN AEROBIC AND ANAEROBIC 10CC EA  Final   Culture    Final           BLOOD CULTURE RECEIVED NO GROWTH TO DATE CULTURE WILL BE HELD FOR 5 DAYS BEFORE ISSUING A FINAL NEGATIVE REPORT Performed at [facility]    Report Status PENDING  Incomplete  Culture, blood (routine x 2) Call MD if unable to obtain prior to antibiotics being given     Status: None (Preliminary result)   Collection Time: 04/21/14  7:00 AM  Result Value Ref Range Status   Specimen Description BLOOD RIGHT FOREARM  Final   Special Requests BOTTLES DRAWN AEROBIC AND ANAEROBIC 10CC EA  Final   Culture   Final           BLOOD CULTURE RECEIVED NO GROWTH TO DATE CULTURE WILL BE HELD FOR 5 DAYS BEFORE ISSUING A FINAL NEGATIVE REPORT Performed at [facility]    Report Status PENDING  Incomplete    Medical History: Past Medical History  Diagnosis Date  . Hypertension   . Anemia, chronic disease     a. Capsule endoscopy reportedly negative 08/2013. b. seen by heme with concern for minor B cell population on BMB, being observed.  . Chronic diastolic CHF (congestive heart failure)     a. EF 60 - 65% per [place] echo 11/2011. b. Echo 02/2012: severe LVH, focal basal hypertrophy, EF 60-65%, mild Mr.  . Cocaine abuse     mentioned in notes from [place]  . Hepatitis C antibody test positive  was HIV negative, 02/28/12  . Hepatitis B core antibody positive     03/01/10  . Positive QuantiFERON-TB Gold test     11/2011  . Helicobacter pylori gastritis     not defined if this was treated  . Polyp of colon, adenomatous     May 2012.  Dr Trenton Founds in [place]  . Hematochezia   . Head injury, closed, with concussion   . History of blood transfusion   . Arthritis     "right shoulder" (11/09/2012)  . Seizure disorder     questionable history of - will need to clarify with PCP  . Headache(784.0)   . ESRD on hemodialysis since 2012    a. Since 2012. ESRD was due to "drugs", primarily used cocaine.  Has 3-5 year hx of HTN, no DM.  Gets HD on TTS schedule at  [facility]. Originally from [place].   . Hypercalcemia   . Hyperpotassemia   . Tobacco abuse   . Protein-calorie malnutrition, severe   . Optic neuritis, left   . Hypertensive heart disease     Medications:  Prescriptions prior to admission  Medication Sig Dispense Refill Last Dose  . calcium acetate (PHOSLO) 667 MG capsule Take 2,001 mg by mouth 3 (three) times daily with meals.    04/20/2014 at Unknown time  . Darbepoetin Alfa (ARANESP) 200 MCG/0.4ML SOSY injection Inject 0.4 mLs (200 mcg total) into the vein every Tuesday with hemodialysis. 1.68 mL  Past Week at Unknown time  . doxercalciferol (HECTOROL) 4 MCG/2ML injection Inject 1.5 mLs (3 mcg total) into the vein Every Tuesday,Thursday,and Saturday with dialysis. 2 mL  04/20/2014 at Unknown time  . hydrALAZINE (APRESOLINE) 100 MG tablet Take 100 mg by mouth 2 (two) times daily. DON'T TAKE ON DIALYSIS DAYS   Past Week at Unknown time  . levETIRAcetam (KEPPRA) 1000 MG tablet Take 1,000 mg by mouth 2 (two) times daily.    04/20/2014 at Unknown time  . Nutritional Supplements (FEEDING SUPPLEMENT, NEPRO CARB STEADY,) LIQD Take 237 mLs by mouth 2 (two) times daily between meals.  0 04/20/2014 at Unknown time   Assessment: 63 YOM with  ESRD on HD T/T/S HD. Admitted with fever, chills, and SOB. Currently on Cefepime Day 3. WBC 4.9, Pt is afebrile. Pt received 4 hrs of dialysis yesterday (BFR 400) with cefepime dose given after session.   1/3 Vancomycin >>1/5 1/3 Cefepime >>   1/3 Blood Cx2>> ngtd 1/3 MRSA PCR neg  Plan:  - Continue Cefepime 2 gm IV TTS-HD - Monitor CBC, C & S, LOT, clinical progression  Enid Skeens, PharmD. Candidate 04/24/13 2:11

## 2014-04-24 NOTE — Progress Notes (Addendum)
Clinical Social Work Department BRIEF PSYCHOSOCIAL ASSESSMENT 04/24/2014  Patient:  [name],[name]     Account Number:  [id number]     Admit date:  04/20/2014  Clinical Social Worker:  Adair Laundry  Date/Time:  04/24/2014 11:35 AM  Referred by:  Physician  Date Referred:  04/24/2014  Other Referral:   Interview type:  Patient Other interview type:    PSYCHOSOCIAL DATA Living Status:  OTHER Admitted from facility:   Level of care:   Primary support name:  Tommy Rainwater Primary support relationship to patient:  FRIEND Degree of support available:   Pt has very limited support    CURRENT CONCERNS Current Concerns  Post-Acute Placement   Other Concerns:    SOCIAL WORK ASSESSMENT / PLAN CSW (Clinical Education officer, museum) made aware of PT/OT recommendation. CSW visited pt room and discussed recommendation with pt. Pt informed CSW that he has been to [facility] in the past but does not want to dc to SNF again. Pt worried he will not have bed at [facility] if he goes to SNF. CSW continued to discuss SNF benefits with pt but pt continues to refuse and wants to return to [facility]. Pt informed CSW he does not have to leave during the day and is allowed to stay in the lobby. Pt not concerned about safety at [facility]. Pt has reliable transportation to and from dialysis. CSW will arrange transport for pt return to [facility] [employer].   Assessment/plan status:  Psychosocial Support/Ongoing Assessment of Needs Other assessment/ plan:   Information/referral to community resources:   SNF list denied    PATIENT'S/FAMILY'S RESPONSE TO PLAN OF CARE: Pt extremely worried about long term needs and not having a place to go if he discharges to SNF right now.         [place], [place]

## 2014-04-24 NOTE — Progress Notes (Signed)
CSW (Clinical Education officer, museum) did visit pt yesterday 01/05 but pt did not speak with CSW. CSW spoke with [facility] and was notified pt uses [facility] to get to and from dialysis. RNCM and nursing staff were able to confirm with pt that plan is to return to [facility]. CSW working with transport company on [place] with transport at discharge.   [place], [place]

## 2014-04-24 NOTE — Progress Notes (Signed)
Upon assessment pt is refusing telemetry and will not wear 2L nasal cannula or any pulse oximetry. Pt is A/Ox2, disoriented to time and situation. Pt is currently lying in bed naked and refusing to wear a gown while continuously fidgeting with genitalia. Pt will desat into 70's when off oxygen. Expressed concern and educated pt about the importance of wearing oxygen. I told pt, "If you do not keep your oxygen on, you will be at risk of being intubated. This is when a machine breathes for you".Pt then asked for candy and was mad when I told him we did not have candy on the unit. MD's on call notified for change in mental status and refusal to wear monitoring devices/oxygen. MD's came to floor to assess pt, no new orders given. Will continue to monitor pt.   [place]illa Sours, Therapist, sports

## 2014-04-25 LAB — BASIC METABOLIC PANEL
Anion gap: 10 (ref 5–15)
BUN: 41 mg/dL — AB (ref 6–23)
CHLORIDE: 95 meq/L — AB (ref 96–112)
CO2: 26 mmol/L (ref 19–32)
Calcium: 7.9 mg/dL — ABNORMAL LOW (ref 8.4–10.5)
Creatinine, Ser: 7.68 mg/dL — ABNORMAL HIGH (ref 0.50–1.35)
GFR calc Af Amer: 8 mL/min — ABNORMAL LOW (ref >90)
GFR calc non Af Amer: 7 mL/min — ABNORMAL LOW (ref >90)
GLUCOSE: 136 mg/dL — AB (ref 70–99)
POTASSIUM: 4.2 mmol/L (ref 3.5–5.1)
SODIUM: 131 mmol/L — AB (ref 135–145)

## 2014-04-25 LAB — TYPE AND SCREEN
ABO/RH(D): A POS
ANTIBODY SCREEN: NEGATIVE
Unit division: 0
Unit division: 0

## 2014-04-25 LAB — CBC
HCT: 27.7 % — ABNORMAL LOW (ref 39.0–52.0)
Hemoglobin: 9.1 g/dL — ABNORMAL LOW (ref 13.0–17.0)
MCH: 29.9 pg (ref 26.0–34.0)
MCHC: 32.9 g/dL (ref 30.0–36.0)
MCV: 91.1 fL (ref 78.0–100.0)
Platelets: 274 10*3/uL (ref 150–400)
RBC: 3.04 MIL/uL — ABNORMAL LOW (ref 4.22–5.81)
RDW: 14.9 % (ref 11.5–15.5)
WBC: 6.8 10*3/uL (ref 4.0–10.5)

## 2014-04-25 MED ORDER — GUAIFENESIN-CODEINE 100-10 MG/5ML PO SOLN: 5.0000 mL | Freq: Four times a day (QID) | ORAL | Status: DC | PRN

## 2014-04-25 MED ORDER — LEVETIRACETAM 500 MG PO TABS: 500.0000 mg | ORAL_TABLET | ORAL | Status: DC

## 2014-04-25 MED ORDER — LEVETIRACETAM 1000 MG PO TABS: 1000.0000 mg | ORAL_TABLET | Freq: Every day | ORAL | Status: DC

## 2014-04-25 MED ORDER — DEXTROSE 5 % IV SOLN: 2.0000 g | INTRAVENOUS | Status: AC

## 2014-04-25 MED ORDER — RENA-VITE PO TABS: 1.0000 | ORAL_TABLET | Freq: Every day | ORAL | Status: DC

## 2014-04-25 NOTE — Progress Notes (Signed)
Subjective:  Patient refused medications again over night with no reasons given to overnight team. There were also reports have some mild changes in mental status though overnight team did no signs upon interview and examination. Otherwise, patient states that he is doing well and enjoyed his french fries yesterday.  Objective: Vital signs in last 24 hours: Filed Vitals:   04/25/14 1130 04/25/14 1200 04/25/14 1315 04/25/14 1356  BP: 107/47 95/69 143/63 120/50  Pulse: 75 71 72 75  Temp:   98.4 F (36.9 C) 98 F (36.7 C)  TempSrc:   Oral Oral  Resp:   18 18  Height:      Weight:   64.2 kg (141 lb 8.6 oz)   SpO2:   96% 92%   Weight change: -0.667 kg (-1 lb 7.5 oz)  Intake/Output Summary (Last 24 hours) at 04/25/14 1510 Last data filed at 04/25/14 1315  Gross per 24 hour  Intake    240 ml  Output   2720 ml  Net  -2480 ml   Physical Exam: General: AA male, somnolent, cooperative  HEENT: PERRL, EOMI. Moist mucus membranes,  Lungs: Clear to auscultation the right lung base, no wheezes, good air movement Heart: Regular rate and rhythm, no murmurs, gallops, or rubs Abdomen: Soft, non-tender, non-distended, BS + Extremities: No cyanosis, clubbing, or edema. Significant dry skin and flaking on LE's.  Neurologic: alert and oriented 3, resolved oral dystonia  Lab Results: Basic Metabolic Panel:  Recent Labs Lab 04/21/14 0645  04/22/14 0845  04/24/14 0402 04/25/14 0504  NA  --   < >  --   < > 136 131*  K  --   < >  --   < > 4.1 4.2  CL  --   < >  --   < > 97 95*  CO2  --   < >  --   < > 29 26  GLUCOSE  --   < >  --   < > 125* 136*  BUN  --   < >  --   < > 26* 41*  CREATININE  --   < >  --   < > 5.69* 7.68*  CALCIUM  --   < >  --   < > 8.5 7.9*  MG 2.3  --   --   --   --   --   PHOS 8.7*  --  6.6*  --   --   --   < > = values in this interval not displayed. Liver Function Tests:  Recent Labs Lab 04/20/14 2303 04/23/14 0429 04/24/14 0402  AST 31  --  24  ALT 22   --  16  ALKPHOS 61  --  62  BILITOT 0.7  --  0.6  PROT 6.6  --  6.6  ALBUMIN 2.7* 2.5* 2.4*   CBC:  Recent Labs Lab 04/21/14 0645 04/22/14 0346  04/24/14 0402 04/25/14 0504  WBC 8.5 6.1  < > 4.9 6.8  NEUTROABS 7.5 5.0  --   --   --   HGB 6.6* 7.6*  < > 9.3* 9.1*  HCT 20.2* 22.8*  < > 29.5* 27.7*  MCV 89.8 86.7  < > 91.6 91.1  PLT 166 192  < > 270 274  < > = values in this interval not displayed. Cardiac Enzymes:  Recent Labs Lab 04/21/14 1600 04/21/14 2250 04/22/14 0845  TROPONINI 0.76* 0.90* 0.90*   Micro Results: Recent Results (from  the past 240 hour(s))  MRSA PCR Screening     Status: None   Collection Time: 04/21/14  6:26 AM  Result Value Ref Range Status   MRSA by PCR NEGATIVE NEGATIVE Final    Comment:        The GeneXpert MRSA Assay (FDA approved for NASAL specimens only), is one component of a comprehensive MRSA colonization surveillance program. It is not intended to diagnose MRSA infection nor to guide or monitor treatment for MRSA infections.   Culture, blood (routine x 2) Call MD if unable to obtain prior to antibiotics being given     Status: None (Preliminary result)   Collection Time: 04/21/14  6:45 AM  Result Value Ref Range Status   Specimen Description BLOOD RIGHT HAND  Final   Special Requests BOTTLES DRAWN AEROBIC AND ANAEROBIC 10CC EA  Final   Culture   Final           BLOOD CULTURE RECEIVED NO GROWTH TO DATE CULTURE WILL BE HELD FOR 5 DAYS BEFORE ISSUING A FINAL NEGATIVE REPORT Performed at [facility]    Report Status PENDING  Incomplete  Culture, blood (routine x 2) Call MD if unable to obtain prior to antibiotics being given     Status: None (Preliminary result)   Collection Time: 04/21/14  7:00 AM  Result Value Ref Range Status   Specimen Description BLOOD RIGHT FOREARM  Final   Special Requests BOTTLES DRAWN AEROBIC AND ANAEROBIC 10CC EA  Final   Culture   Final           BLOOD CULTURE RECEIVED NO GROWTH TO DATE  CULTURE WILL BE HELD FOR 5 DAYS BEFORE ISSUING A FINAL NEGATIVE REPORT Performed at [facility]    Report Status PENDING  Incomplete   Studies/Results: No results found. Medications: I have reviewed the patient's current medications. Scheduled Meds: . calcium acetate  2,001 mg Oral TID WC  . ceFEPime (MAXIPIME) IV  2 g Intravenous Q T,Th,Sa-HD  . Darbepoetin Alfa  200 mcg Intravenous Q Tue-HD  . doxercalciferol  3 mcg Intravenous Q T,Th,Sa-HD  . feeding supplement (NEPRO CARB STEADY)  237 mL Oral BID BM  . heparin  5,000 Units Subcutaneous 3 times per day  . hydrALAZINE  100 mg Oral BID  . levETIRAcetam  1,000 mg Oral Daily  . levETIRAcetam  500 mg Oral Q T,Th,Sa-HD  . multivitamin  1 tablet Oral QHS  . sodium chloride  3 mL Intravenous Q12H   Continuous Infusions:  PRN Meds:.acetaminophen, guaiFENesin-codeine, HYDROcodone-acetaminophen, promethazine   Assessment/Plan:  Patient is a 59 year old male with a history of hypertension, anemia, end-stage renal disease on hemodialysis (Tuesday, Thursday, Saturday), heart failure with preserved ejection fraction, seizure disorder, polysubstance abuse who originally presented with healthcare associated pneumonia, complicated by demand ischemia, and now with improving pulmonary edema on hemodialysis.  HCAP: Patient continues to improve in terms of his initial admission symptoms of dyspnea and cough. Patient still needs intermittent supplemental oxygenation. Patient's physical exam findings are also moderately improved with decreased Rales. Repeat chest x-ray with evidence of pulmonary edema with no infiltrate, given initial presenting symptoms, will continue to treat for full course coverage. -Patient to continue cefepime with plans for end date on 05/01/2014. Patient will receive cefepime with dialysis. Avoiding fluoroquinolones secondary to QT prolongation. -Blood cultures no growth to date (from 04/21/2014) -Sputum cultures not  collected.  ESRD on HD (TTS): ESRD thought to be due to cocaine use. Patient received dialysis yesterday and  is 2.4 L net negative over the last 24 hours. Fluid status improved over the interval. -Dialysis per renal. -Continue home Phoslo and Hectorol  Hyponatremia: Likely secondary to renal failure given hypovolemic state.  -Due for dialysis today.  Medication non-compliance: Refused all medications yesterday evening. Intermittently takes off his nasal cannula. However, upon interview during rounds, patient does not report remembering any of this. Further care in a skilled nursing facility will be best in terms of further compliance with his medications.   Normocytic anemia: Required multiple transfusion in past. Extensive work-up recently, including recent hospitalization without finding source of GI bleeding and bone marrow biopsy with concern for B cell lymphoproliferative process, but oncology feels anemia is secondary to ESRD with some ongoing GI bleeding. Patient's hemoglobin stable at 9.1 from 9.3 yesterday. -Continue to monitor CBC -Continue Aranesp w/ HD  HTN: Blood pressure control much improved over the last 24 hours. Patient running a little bit low today but secondary to hemodialysis. -Continue home hydralazine 100 mg twice a day.  Seizure disorder: Stable. Thorough review of the chart does not reveal any documentation regarding the initiation of this therapy. Patient's home dose of Keppra 1000 mg twice a day was reduced to 1000 mg daily with 500 mg supplementation with hemodialysis as this is the appropriate renal dosing. Patient is unclear as to the reason of starting his Keppra dose notes a prior history of traumatic head injury. -Keppra level pending -Consider discontinuation of Keppra as an outpatient along with possible neurology follow up.   DVT/PE PPx: Heparin [place]  Screening: -HIV nonreactive  Resolved: -Hyperkalemia -NSTEMI secondary to demand  ischemia -Hypocalcemia -Oral dystonia and tremulousness  Dispo: Disposition is deferred at this time, awaiting improvement of current medical problems.  Anticipated discharge in approximately 0 day(s).   The patient does have a current PCP Gari Crown Lorin Glass, MD) and does need an [facility] hospital follow-up appointment after discharge.  The patient does not have transportation limitations that hinder transportation to clinic appointments.  .Services Needed at time of discharge: Y = Yes, Blank = No PT:   OT:   RN:   Equipment:   Other:     LOS: 4 days   Luan Moore, MD 04/25/2014, 3:10 PM

## 2014-04-25 NOTE — Procedures (Signed)
Pt seen on HD.  Pt finishing up tx.  Difficult stick to begin with but tolerated procedure well.

## 2014-04-25 NOTE — Clinical Social Work Placement (Addendum)
    Clinical Social Work Department CLINICAL SOCIAL WORK PLACEMENT NOTE 04/25/2014  Patient:  [name],[name]  Account Number:  [id number] Admit date:  04/20/2014  Clinical Social Worker:  Berton Mount, Latanya Presser  Date/time:  04/25/2014 02:02 PM  Clinical Social Work is seeking post-discharge placement for this patient at the following level of care:   SKILLED NURSING   (*CSW will update this form in Epic as items are completed)   04/25/2014  Patient/family provided with [facility] Department of Clinical Social Work's list of facilities offering this level of care within the geographic area requested by the patient (or if unable, by the patient's family).  04/25/2014  Patient/family informed of their freedom to choose among providers that offer the needed level of care, that participate in Medicare, Medicaid or managed care program needed by the patient, have an available bed and are willing to accept the patient.  04/25/2014  Patient/family informed of [facility]' ownership interest in [facility], as well as of the fact that they are under no obligation to receive care at this facility.  PASARR submitted to EDS on Existing PASARR number received on   FL2 transmitted to all facilities in geographic area requested by pt/family on  04/25/2014 FL2 transmitted to all facilities within larger geographic area on   Patient informed that his/her managed care company has contracts with or will negotiate with  certain facilities, including the following:     Patient/family informed of bed offers received:  04/25/2014 Patient chooses bed at [facility] Physician recommends and patient chooses bed at    Patient to be transferred to [place] on  04/25/2014 Patient to be transferred to facility by [facility] Patient and family notified of transfer on 04/25/2014 Name of family member notified:  Pt notified; no family to notify  The following  physician request were entered in Epic: Physician Request  Please sign FL2.    Additional CommentsBerton Mount, [place]

## 2014-04-25 NOTE — Progress Notes (Signed)
OT Cancellation Note  Patient Details Name: [name] MRN: [id number] DOB: [birth year]   Cancelled Treatment:    Reason Eval/Treat Not Completed: Patient at procedure or test/ unavailable. Pt at HD. OT will follow up as available.   Villa Herb M 04/25/2014, 9:33 AM   Cyndie Chime, OTR/L Occupational Therapist [phone] (pager)

## 2014-04-25 NOTE — Progress Notes (Signed)
S: No new CO.  Up walking yest O:BP 138/71 mmHg  Pulse 69  Temp(Src) 98.2 F (36.8 C) (Oral)  Resp 18  Ht 5\' 9"  (1.753 m)  Wt 66.633 kg (146 lb 14.4 oz)  BMI 21.68 kg/m2  SpO2 96%  Intake/Output Summary (Last 24 hours) at 04/25/14 0725 Last data filed at 04/24/14 0752  Gross per 24 hour  Intake    240 ml  Output      0 ml  Net    240 ml   Weight change: -0.667 kg (-1 lb 7.5 oz) HWE:XHBZJ and alert CVS: RRR Resp:bil crackles, somewhat improved Abd:+ BS NTND Ext:no edema  L AVF + bruit NEURO: CNI, Ox3 no asterixis   . calcium acetate  2,001 mg Oral TID WC  . ceFEPime (MAXIPIME) IV  2 g Intravenous Q T,Th,Sa-HD  . Darbepoetin Alfa  200 mcg Intravenous Q Tue-HD  . doxercalciferol  3 mcg Intravenous Q T,Th,Sa-HD  . feeding supplement (NEPRO CARB STEADY)  237 mL Oral BID BM  . heparin  5,000 Units Subcutaneous 3 times per day  . hydrALAZINE  100 mg Oral BID  . levETIRAcetam  1,000 mg Oral Daily  . levETIRAcetam  500 mg Oral Q T,Th,Sa-HD  . multivitamin  1 tablet Oral QHS  . sodium chloride  3 mL Intravenous Q12H   No results found. BMET    Component Value Date/Time   NA 131* 04/25/2014 0504   NA 141 11/09/2013 0931   K 4.2 04/25/2014 0504   K 4.6 11/09/2013 0931   CL 95* 04/25/2014 0504   CO2 26 04/25/2014 0504   CO2 32* 11/09/2013 0931   GLUCOSE 136* 04/25/2014 0504   GLUCOSE 77 11/09/2013 0931   BUN 41* 04/25/2014 0504   BUN 28.5* 11/09/2013 0931   CREATININE 7.68* 04/25/2014 0504   CREATININE 6.6* 11/09/2013 0931   CALCIUM 7.9* 04/25/2014 0504   CALCIUM 9.4 11/09/2013 0931   GFRNONAA 7* 04/25/2014 0504   GFRAA 8* 04/25/2014 0504   CBC    Component Value Date/Time   WBC 6.8 04/25/2014 0504   WBC 4.0 11/09/2013 0931   RBC 3.04* 04/25/2014 0504   RBC 2.16* 04/06/2014 0106   RBC 5.05 11/09/2013 0931   HGB 9.1* 04/25/2014 0504   HGB 14.1 11/09/2013 0931   HCT 27.7* 04/25/2014 0504   HCT 45.2 11/09/2013 0931   PLT 274 04/25/2014 0504   PLT 246  11/09/2013 0931   MCV 91.1 04/25/2014 0504   MCV 89.4 11/09/2013 0931   MCH 29.9 04/25/2014 0504   MCH 27.9 11/09/2013 0931   MCHC 32.9 04/25/2014 0504   MCHC 31.2* 11/09/2013 0931   RDW 14.9 04/25/2014 0504   RDW 17.5* 11/09/2013 0931   LYMPHSABS 0.5* 04/22/2014 0346   LYMPHSABS 1.0 11/09/2013 0931   MONOABS 0.5 04/22/2014 0346   MONOABS 0.5 11/09/2013 0931   EOSABS 0.0 04/22/2014 0346   EOSABS 0.2 11/09/2013 0931   BASOSABS 0.0 04/22/2014 0346   BASOSABS 0.1 11/09/2013 0931     Assessment: 1. PNA 2. Hyperkalemia, resolved 3. Anemia, partly related to recurrent GI bleeds, Hg better 4. HTN 5. Hypocalcemia, improved   Plan: 1.  HD today.  He will need to be O2 free prior to DC as home O2 will not work as he lives in a shelter  [place]

## 2014-04-25 NOTE — Progress Notes (Signed)
CSW (Clinical Education officer, museum) spoke with [facility] and they informed CSW that pt only has one Medicare day and they cannot offer a bed after finding out this information. CSW spoke with [facility] representative and they confirmed the [facility] building can offer pt a bed. CSW notified that [place] supervisor will be assisting with pt dc from SNF to insure pt can return to [facility]. CSW also notified that pt used [facility] transport to get to dialysis. Transport company was also notified that pt will need to be picked up from [facility] for Saturday dialysis. CSW spoke with pt and he is agreeable to this plan. CSW confirmed with pt that [facility] will assist in getting his possessions from [facility].   CSW (Clinical Education officer, museum) prepared pt dc packet and placed with shadow chart. CSW arranged non-emergent ambulance transport. Pt, pt nurse, and facility informed. Pt declined for CSW to contact anyone about discharge. CSW signing off.  [place], [place]

## 2014-04-25 NOTE — Discharge Instructions (Signed)
Please follow up with cardiology, your newly established primary care provider, and nephrology as indicated in the follow up section. Please continue on antibiotic therapy as specified until 05/01/14.    Pneumonia, Adult Pneumonia is an infection of the lungs. It may be caused by a germ (virus or bacteria). Some types of pneumonia can spread easily from person to person. This can happen when you cough or sneeze. HOME CARE  Only take medicine as told by your doctor.  Take your medicine (antibiotics) as told. Finish it even if you start to feel better.  Do not smoke.  You may use a vaporizer or humidifier in your room. This can help loosen thick spit (mucus).  Sleep so you are almost sitting up (semi-upright). This helps reduce coughing.  Rest. A shot (vaccine) can help prevent pneumonia. Shots are often advised for:  People over 84 years old.  Patients on chemotherapy.  People with long-term (chronic) lung problems.  People with immune system problems. GET HELP RIGHT AWAY IF:   You are getting worse.  You cannot control your cough, and you are losing sleep.  You cough up blood.  Your pain gets worse, even with medicine.  You have a fever.  Any of your problems are getting worse, not better.  You have shortness of breath or chest pain. MAKE SURE YOU:   Understand these instructions.  Will watch your condition.  Will get help right away if you are not doing well or get worse. Document Released: 09/22/2007 Document Revised: 06/28/2011 Document Reviewed: 06/26/2010 [facility] Patient Information 2015 [place], [place]. This information is not intended to replace advice given to you by your health care provider. Make sure you discuss any questions you have with your health care provider.
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
## 2014-04-25 NOTE — Progress Notes (Signed)
PT Cancellation Note  Patient Details Name: [name] Age MRN: [id number] DOB: [birth year]   Cancelled Treatment:    Reason Eval/Treat Not Completed: Patient at procedure or test/unavailable (Pt in HD.  Will return as able.  Thanks.)   Irwin Brakeman F 04/25/2014, 10:57 AM [facility] [phone] (pager)

## 2014-04-26 ENCOUNTER — Non-Acute Institutional Stay (SKILLED_NURSING_FACILITY): Payer: Medicare Other | Admitting: Adult Health

## 2014-04-26 ENCOUNTER — Encounter: Payer: Self-pay | Admitting: Adult Health

## 2014-04-26 DIAGNOSIS — J189 Pneumonia, unspecified organism: Secondary | ICD-10-CM

## 2014-04-26 DIAGNOSIS — E43 Unspecified severe protein-calorie malnutrition: Secondary | ICD-10-CM

## 2014-04-26 DIAGNOSIS — Z992 Dependence on renal dialysis: Secondary | ICD-10-CM

## 2014-04-26 DIAGNOSIS — N185 Chronic kidney disease, stage 5: Secondary | ICD-10-CM

## 2014-04-26 DIAGNOSIS — N189 Chronic kidney disease, unspecified: Secondary | ICD-10-CM

## 2014-04-26 DIAGNOSIS — N186 End stage renal disease: Secondary | ICD-10-CM

## 2014-04-26 DIAGNOSIS — G40909 Epilepsy, unspecified, not intractable, without status epilepticus: Secondary | ICD-10-CM

## 2014-04-26 DIAGNOSIS — I119 Hypertensive heart disease without heart failure: Secondary | ICD-10-CM

## 2014-04-26 DIAGNOSIS — D631 Anemia in chronic kidney disease: Secondary | ICD-10-CM | POA: Insufficient documentation

## 2014-04-26 DIAGNOSIS — N2581 Secondary hyperparathyroidism of renal origin: Secondary | ICD-10-CM

## 2014-04-26 NOTE — Progress Notes (Signed)
Patient ID: [name], male   DOB: 09/18/1955, 59 y.o.   MRN: [id number]  [facility]     Allergies  Allergen Reactions  . Reglan [Metoclopramide] Other (See Comments)    Tardive dyskinesia in 11/2011 in [facility] Complaint  Patient presents with  . Hospitalization Follow-up    HPI:  He has been hospitalized for health care associated pneumonia. [name] a hemodialysis patient. [name] here for short term rehab. He had been in another assisted living prior to his hospitalization; but [facility] to that. He states that he is feeling much better.   Past Medical History  Diagnosis Date  . Hypertension   . Anemia, chronic disease     a. Capsule endoscopy reportedly negative 08/2013. b. seen by heme with concern for minor B cell population on BMB, being observed.  . Chronic diastolic CHF (congestive heart failure)     a. EF 60 - 65% per [place] echo 11/2011. b. Echo 02/2012: severe LVH, focal basal hypertrophy, EF 60-65%, mild Mr.  . Cocaine abuse     mentioned in notes from [place]  . Hepatitis C antibody test positive     was HIV negative, 02/28/12  . Hepatitis B core antibody positive     03/01/10  . Positive QuantiFERON-TB Gold test     11/2011  . Helicobacter pylori gastritis     not defined if this was treated  . Polyp of colon, adenomatous     May 2012.  Dr Trenton Founds in [place]  . Hematochezia   . Head injury, closed, with concussion   . History of blood transfusion   . Arthritis     "right shoulder" (11/09/2012)  . Seizure disorder     questionable history of - will need to clarify with PCP  . Headache(784.0)   . ESRD on hemodialysis since 2012    a. Since 2012. ESRD was due to "drugs", primarily used cocaine.  Has 3-5 year hx of HTN, no DM.  Gets HD on TTS schedule at [place]. Originally from [place].   . Hypercalcemia   . Hyperpotassemia   . Tobacco abuse   . Protein-calorie malnutrition, severe   . Optic  neuritis, left   . Hypertensive heart disease   . GI bleed 05/04/2012    Past Surgical History  Procedure Laterality Date  . Shoulder open rotator cuff repair Right   . Total knee arthroplasty Left   . Bascilic vein transposition  03/07/2012    Procedure: BASCILIC VEIN TRANSPOSITION;  Surgeon: Conrad Narcissa, MD;  Location: [facility];  Service: Vascular;  Laterality: Left;  First Stage  . Insertion of dialysis catheter      right chest  . Bascilic vein transposition Left 05/31/2012    Procedure: BASCILIC VEIN TRANSPOSITION;  Surgeon: Conrad Simpson, MD;  Location: [facility];  Service: Vascular;  Laterality: Left;  Left 2nd Stage Basilic Vein Transposition with gortex graft revision using 36mmx10cm graft  . Shoulder open rotator cuff repair Right   . [name] capsule study N/A 08/27/2013    Procedure: [name] CAPSULE STUDY;  Surgeon: Milus Banister, MD;  Location: [facility];  Service: Endoscopy;  Laterality: N/A;    VITAL SIGNS BP 137/69 mmHg  Pulse 70  Ht 5\' 9"  (1.753 m)  Wt 142 lb (64.411 kg)  BMI 20.96 kg/m2   Outpatient Encounter Prescriptions as of 04/26/2014  Medication Sig  . calcium acetate (  PHOSLO) 667 MG capsule Take 2,001 mg by mouth 3 (three) times daily with meals.   Marland Kitchen ceFEPIme 2 g in dextrose 5 % 50 mL Inject 2 g into the vein Every Tuesday,Thursday,and Saturday with dialysis.  . Darbepoetin Alfa (ARANESP) 200 MCG/0.4ML SOSY injection Inject 0.4 mLs (200 mcg total) into the vein every Tuesday with hemodialysis.  Marland Kitchen doxercalciferol (HECTOROL) 4 MCG/2ML injection Inject 1.5 mLs (3 mcg total) into the vein Every Tuesday,Thursday,and Saturday with dialysis.  Marland Kitchen guaiFENesin-codeine 100-10 MG/5ML syrup Take 5 mLs by mouth every 6 (six) hours as needed for cough.  . hydrALAZINE (APRESOLINE) 100 MG tablet Take 100 mg by mouth 2 (two) times daily. DON'T TAKE ON DIALYSIS DAYS  . levETIRAcetam (KEPPRA) 1000 MG tablet Take 1 tablet (1,000 mg total) by mouth daily.  Marland Kitchen levETIRAcetam (KEPPRA) 500 MG  tablet Take 1 tablet (500 mg total) by mouth Every Tuesday,Thursday,and Saturday with dialysis.  Marland Kitchen multivitamin (RENA-VIT) TABS tablet Take 1 tablet by mouth at bedtime.  . Nutritional Supplements (FEEDING SUPPLEMENT, NEPRO CARB STEADY,) LIQD Take 237 mLs by mouth 2 (two) times daily between meals.     SIGNIFICANT DIAGNOSTIC EXAMS  04-21-14: chest x-ray; Developing airspace consolidation in the right lower lung and left mid lung most likely represent pneumonia.  04-22-14: chest x-ray: Moderate diffuse pulmonary edema.    LABS REVIEWED:  04-20-14: wbc 10.6; hgb 7.7; hct 23.7;mcv 90.8 plt 193; glucose 102; bun 155; creat 13.27; k+ 6.8; na++135; ca++ 5.6  04-21-14: wbc 8.5; hgb 6.6; hct 20.2; mcv 90.8; plt 166; glucose 95; bun 160; creat 13.08; k+4.8; na++140; ca++5.4; phos 8.7; mag 2.3; ionized ca++ 0.62; chol 130; ldl 78; trig 93; hdl 33 04-23-14: wbc 7.2; hgb 8.2; hct 25.8; mcv 90.2; plt 244; glucose 90; bun 15; creat 3.95; k+4.4; na++138; ca++ 9.8 04-24-14: glucose 125; bun 26; creat 5.69; k+4.1; na++136; liver normal albumin 2.4  04-25-14: wbc 6.8; hgb9.1; hct 27.7 ;mcv 91.1.; plt 274; glucose 136; bun 41; creat 7.68; k+4.2; na+= 131; ca++ 7.9      Review of Systems  Constitutional: Negative for weight loss and malaise/fatigue.  Respiratory: Positive for cough. Negative for sputum production, shortness of breath and wheezing.   Cardiovascular: Negative for chest pain, palpitations and leg swelling.  Gastrointestinal: Negative for heartburn, abdominal pain and constipation.  Musculoskeletal: Negative for myalgias.  Skin: Negative.   Psychiatric/Behavioral: Negative for depression. The patient is not nervous/anxious.      Physical Exam  Constitutional: [name] oriented to person, place, and time. No distress.  Neck: Neck supple. No JVD present. No thyromegaly present.  Cardiovascular: Normal rate, regular rhythm and intact distal pulses.   Respiratory: Effort normal and breath sounds normal.  No respiratory distress.  GI: Soft. Bowel sounds are normal. He exhibits no distension. There is no tenderness.  Musculoskeletal: Normal range of motion. He exhibits no edema.  Lymphadenopathy:    He has no cervical adenopathy.  Neurological: [name] alert and oriented to person, place, and time.  Skin: Skin is warm and dry. [name] not diaphoretic.  Left upper arm a/v shunt +thril +bruit        ASSESSMENT/ PLAN:  1. Health associated pneumonia: will complete his cefepime 1 gm at dialysis on 05-01-14. Will continue robitussin with codeine 5 cc every 6 hours as needed  Will not make changes will monitor his status.   2. ESRD on hemodialysis: is followed by nephrology; will continue hemodialysis three days per week;will conitnue phoslo  2001 mg  three times daily will monitor   3. Anemia in chronic kidney disease: he is status post transfusion in hospital; will continue aranesp at dialysis; will to monitor his status  4. Hyperparathyroidism: will continue hectorol at hemodialysis  5. Hypertension: will continue apresoline 100 mg twice daily   6. Seizures: no reports of seizure activity present; will continue keppra 1 gm daily with 500 mg 3 times weekly with hemodialysis. Will monitor   7. Malnutrition: will continue supplement via facility protocol and will monitor his status.    Time spent with patient 50 minutes.       Ok Edwards NP [facility]  Contact [phone] Monday through Friday 8am- 5pm  After hours call [phone]

## 2014-04-27 LAB — LEVETIRACETAM LEVEL: Levetiracetam Lvl: 15.2 ug/mL

## 2014-04-27 LAB — CULTURE, BLOOD (ROUTINE X 2)
CULTURE: NO GROWTH
CULTURE: NO GROWTH

## 2014-05-03 ENCOUNTER — Encounter: Payer: Medicare Other | Admitting: Physician Assistant

## 2014-05-06 ENCOUNTER — Non-Acute Institutional Stay (SKILLED_NURSING_FACILITY): Payer: Medicare Other | Admitting: Internal Medicine

## 2014-05-06 DIAGNOSIS — N185 Chronic kidney disease, stage 5: Secondary | ICD-10-CM

## 2014-05-06 DIAGNOSIS — D631 Anemia in chronic kidney disease: Secondary | ICD-10-CM

## 2014-05-06 DIAGNOSIS — G40909 Epilepsy, unspecified, not intractable, without status epilepticus: Secondary | ICD-10-CM

## 2014-05-06 DIAGNOSIS — E43 Unspecified severe protein-calorie malnutrition: Secondary | ICD-10-CM

## 2014-05-06 DIAGNOSIS — J189 Pneumonia, unspecified organism: Secondary | ICD-10-CM

## 2014-05-06 DIAGNOSIS — I119 Hypertensive heart disease without heart failure: Secondary | ICD-10-CM

## 2014-05-06 DIAGNOSIS — G4700 Insomnia, unspecified: Secondary | ICD-10-CM

## 2014-05-06 DIAGNOSIS — Z992 Dependence on renal dialysis: Secondary | ICD-10-CM

## 2014-05-06 DIAGNOSIS — N186 End stage renal disease: Secondary | ICD-10-CM

## 2014-05-11 ENCOUNTER — Encounter: Payer: Self-pay | Admitting: Internal Medicine

## 2014-05-11 NOTE — Progress Notes (Signed)
Patient ID: [name], male   DOB: Aug 14, 1955, 59 y.o.   MRN: [id number]    HISTORY AND PHYSICAL  Location:  [facility]    Place of Service: SNF [phone])   Extended Emergency Contact Information Primary Emergency Contact: Davis,Eric Address: [address] [place] [address] Johnnette Litter of [place] Phone: [phone] Mobile Phone: [phone] Relation: Friend  Advanced Directive information  Full Code  Chief Complaint  Patient presents with  . New Admit To SNF    HCAP, ESRD/HD TThSat, HTN, seizure d/o, tobacco abuse, chest pain with elevated troponin, hx anemia, elevated K/decreased Ca, SOB, hx diatolic HF, polysubstance abuse    HPI:  59 yo male seen today as a new admission to SNF for above. He c/o insomnia and has always been a light sleeper. He has not tried any OTC sleep aides in the past. No Other concerns. Appetite is excellent. [name] receiving PT/OT and tolerating activity. He goes to HD as scheduled. SOB improved. No CP, f/c. He still makes a little urine. [name] on Cefepime with HD through left arm AVF. No nursing issues  Past Medical History  Diagnosis Date  . Hypertension   . Anemia, chronic disease     a. Capsule endoscopy reportedly negative 08/2013. b. seen by heme with concern for minor B cell population on BMB, being observed.  . Chronic diastolic CHF (congestive heart failure)     a. EF 60 - 65% per [place] echo 11/2011. b. Echo 02/2012: severe LVH, focal basal hypertrophy, EF 60-65%, mild Mr.  . Cocaine abuse     mentioned in notes from [place]  . Hepatitis C antibody test positive     was HIV negative, 02/28/12  . Hepatitis B core antibody positive     03/01/10  . Positive QuantiFERON-TB Gold test     11/2011  . Helicobacter pylori gastritis     not defined if this was treated  . Polyp of colon, adenomatous     May 2012.  Dr [place] Founds in [place]  . Hematochezia   . Head injury, closed, with concussion   .  History of blood transfusion   . Arthritis     "right shoulder" (11/09/2012)  . Seizure disorder     questionable history of - will need to clarify with PCP  . Headache(784.0)   . ESRD on hemodialysis since 2012    a. Since 2012. ESRD was due to "drugs", primarily used cocaine.  Has 3-5 year hx of HTN, no DM.  Gets HD on TTS schedule at [facility]. Originally from [place].   . Hypercalcemia   . Hyperpotassemia   . Tobacco abuse   . Protein-calorie malnutrition, severe   . Optic neuritis, left   . Hypertensive heart disease   . GI bleed 05/04/2012    Past Surgical History  Procedure Laterality Date  . Shoulder open rotator cuff repair Right   . Total knee arthroplasty Left   . Bascilic vein transposition  03/07/2012    Procedure: BASCILIC VEIN TRANSPOSITION;  Surgeon: Conrad Mannsville, MD;  Location: [facility];  Service: Vascular;  Laterality: Left;  First Stage  . Insertion of dialysis catheter      right chest  . Bascilic vein transposition Left 05/31/2012    Procedure: BASCILIC VEIN TRANSPOSITION;  Surgeon: Conrad Eureka, MD;  Location: [facility];  Service: Vascular;  Laterality: Left;  Left 2nd Stage Basilic  Vein Transposition with gortex graft revision using 57mmx10cm graft  . Shoulder open rotator cuff repair Right   . [name] capsule study N/A 08/27/2013    Procedure: [name] CAPSULE STUDY;  Surgeon: Milus Banister, MD;  Location: [facility];  Service: Endoscopy;  Laterality: N/A;    Patient Care Team: Lynne Logan, MD as PCP - General (Family Medicine) Elmarie Shiley, MD (Nephrology)  History   Social History  . Marital Status: Single    Spouse Name: N/A    Number of Children: 1  . Years of Education: N/A   Occupational History  . Unemployed    Social History Main Topics  . Smoking status: Current Every Day Smoker -- 0.25 packs/day for 25 years    Types: Cigarettes  . Smokeless tobacco: Never Used  . Alcohol Use: No     Comment: 11/09/2012 "been stopped drinking 1-2 yr ago"    . Drug Use: Yes    Special: "Crack" cocaine, Marijuana     Comment: Prior crack cocaine, marijuana use  . Sexual Activity: Yes    Birth Control/ Protection: None   Other Topics Concern  . Not on file   Social History Narrative     reports that he has been smoking Cigarettes.  He has a 6.25 pack-year smoking history. He has never used smokeless tobacco. He reports that he uses illicit drugs ("Crack" cocaine and Marijuana). He reports that he does not drink alcohol.  Family History  Problem Relation Age of Onset  . Diabetes Mother   . Hypertension Mother   . Stroke Mother   . Kidney failure Mother   . Cancer Father    Family Status  Relation Status Death Age  . Mother Deceased   . Father Deceased      There is no immunization history on file for this patient.  Allergies  Allergen Reactions  . Reglan [Metoclopramide] Other (See Comments)    Tardive dyskinesia in 11/2011 in [place]    Medications: Patient's Medications  New Prescriptions   No medications on file  Previous Medications   CALCIUM ACETATE (PHOSLO) 667 MG CAPSULE    Take 2,001 mg by mouth 3 (three) times daily with meals.    DARBEPOETIN ALFA (ARANESP) 200 MCG/0.4ML SOSY INJECTION    Inject 0.4 mLs (200 mcg total) into the vein every Tuesday with hemodialysis.   DOXERCALCIFEROL (HECTOROL) 4 MCG/2ML INJECTION    Inject 1.5 mLs (3 mcg total) into the vein Every Tuesday,Thursday,and Saturday with dialysis.   GUAIFENESIN-CODEINE 100-10 MG/5ML SYRUP    Take 5 mLs by mouth every 6 (six) hours as needed for cough.   HYDRALAZINE (APRESOLINE) 100 MG TABLET    Take 100 mg by mouth 2 (two) times daily. DON'T TAKE ON DIALYSIS DAYS   LEVETIRACETAM (KEPPRA) 1000 MG TABLET    Take 1 tablet (1,000 mg total) by mouth daily.   LEVETIRACETAM (KEPPRA) 500 MG TABLET    Take 1 tablet (500 mg total) by mouth Every Tuesday,Thursday,and Saturday with dialysis.   MULTIVITAMIN (RENA-VIT) TABS TABLET    Take 1 tablet by mouth at  bedtime.   NUTRITIONAL SUPPLEMENTS (FEEDING SUPPLEMENT, NEPRO CARB STEADY,) LIQD    Take 237 mLs by mouth 2 (two) times daily between meals.  Modified Medications   No medications on file  Discontinued Medications   No medications on file    Review of Systems As above. All other systems reviewed are negative  **Past medical, surgical, family and social history reviewed  Danley Danker  Vitals:   05/06/14 2035  BP: 123/68  Pulse: 75  Temp: 98.1 F (36.7 C)  Weight: 148 lb (67.132 kg)  SpO2: 98%   Body mass index is 21.85 kg/(m^2).  Physical Exam CONSTITUTIONAL: Looks well in NAD. Awake, alert and oriented x 3 HEENT: PERRLA. No scleral icterus.  Oropharynx clear and without exudate. MMM NECK: Supple. Nontender. No palpable cervical or supraclavicular lymph nodes. No carotid bruit b/l.  CVS: Regular rate with 1/6 SEmurmur. No gallop or rub. LUNGS: CTA b/l no wheezing, rales or rhonchi. ABDOMEN: Bowel sounds present x 4. Soft, nontender, nondistended. No palpable mass or bruit EXTREMITIES: No edema b/l. Distal pulses palpable. No calf tenderness. Left arm AVF with palpable thrill and audible bruit. PSYCH: Affect, behavior and mood normal SKIN: dry with excoriations. No rash. Intact   Labs reviewed: Admission on 04/21/2014, Discharged on 04/25/2014  No results displayed because visit has over 200 results.     CMP Latest Ref Rng 04/25/2014 04/24/2014 04/23/2014  Glucose 70 - 99 mg/dL 136(H) 125(H) 90  BUN 6 - 23 mg/dL 41(H) 26(H) 15  Creatinine 0.50 - 1.35 mg/dL 7.68(H) 5.69(H) 3.95(H)  Sodium 135 - 145 mmol/L 131(L) 136 138  Potassium 3.5 - 5.1 mmol/L 4.2 4.1 4.1  Chloride 96 - 112 mEq/L 95(L) 97 98  CO2 19 - 32 mmol/L 26 29 27   Calcium 8.4 - 10.5 mg/dL 7.9(L) 8.5 9.8  Total Protein 6.0 - 8.3 g/dL - 6.6 -  Total Bilirubin 0.3 - 1.2 mg/dL - 0.6 -  Alkaline Phos 39 - 117 U/L - 62 -  AST 0 - 37 U/L - 24 -  ALT 0 - 53 U/L - 16 -    CBC Latest Ref Rng 04/25/2014 04/24/2014 04/23/2014  WBC  4.0 - 10.5 K/uL 6.8 4.9 7.2  Hemoglobin 13.0 - 17.0 g/dL 9.1(L) 9.3(L) 8.2(L)  Hematocrit 39.0 - 52.0 % 27.7(L) 29.5(L) 25.8(L)  Platelets 150 - 400 K/uL 274 270 244     Dg Chest Port 1 View  04/22/2014   CLINICAL DATA:  Short of breath  EXAM: PORTABLE CHEST - 1 VIEW  COMPARISON:  04/21/2014  FINDINGS: There is moderate cardiac enlargement. Diffuse bilateral airspace opacities are again identified and do not appear significantly improved from previous exam.  IMPRESSION: Moderate diffuse pulmonary edema.   Electronically Signed   By: Kerby Moors M.D.   On: 04/22/2014 08:20   Dg Chest Port 1 View  04/21/2014   CLINICAL DATA:  Cough and fever for 3 days.  EXAM: PORTABLE CHEST - 1 VIEW  COMPARISON:  04/05/2014  FINDINGS: Cardiac enlargement without vascular congestion. New development of airspace consolidation in the right lower lung and left mid lung most likely representing pneumonia. No blunting of costophrenic angles. No pneumothorax. Postoperative changes and degenerative change in the right shoulder.  IMPRESSION: Developing airspace consolidation in the right lower lung and left mid lung most likely represent pneumonia.   Electronically Signed   By: Lucienne Capers M.D.   On: 04/21/2014 01:16   [facility] records reviewed  Assessment/Plan   ICD-9-CM ICD-10-CM   1. HCAP (healthcare-associated pneumonia)- improving 486 J18.9   2. Protein-calorie malnutrition, severe 262 E43   3. ESRD on hemodialysis 585.6 N18.6    V45.11 Z99.2   4. Hypertensive heart disease without heart failure- stable 402.90 I11.9   5. Seizure disorder - stable 345.90 G40.909   6. Anemia of chronic kidney failure, stage 5 285.21 N18.5    585.5 D63.1   7.  Insomnia 780.52 G47.00    --Rx melatonin 3mg  qhs for insomnia.  --continue all other medications as ordered  --continue HA on TThSat per nephrology  --continue PT/OT as ordered  --complete Cefepime 2gm  with HD  GOAL: short term rehab for deconditioning. D/c  home once medically appropriate.   Natassia Guthridge S. Perlie Gold  [facility] [address] [place], [place] [address] [phone] Office (Wednesdays and Fridays 8 AM - 5 PM) [phone] Cell (Monday-Friday 8 AM - 5 PM)

## 2014-06-07 ENCOUNTER — Ambulatory Visit: Payer: Medicare Other | Admitting: Internal Medicine

## 2015-04-12 ENCOUNTER — Other Ambulatory Visit (HOSPITAL_COMMUNITY): Payer: Medicare Other

## 2015-04-12 ENCOUNTER — Emergency Department (HOSPITAL_COMMUNITY): Payer: Medicare Other

## 2015-04-12 ENCOUNTER — Inpatient Hospital Stay (HOSPITAL_COMMUNITY)
Admission: EM | Admit: 2015-04-12 | Discharge: 2015-04-12 | DRG: 377 | Discharge status: Against Medical Advice | Payer: Medicare Other | Attending: Internal Medicine | Admitting: Internal Medicine

## 2015-04-12 ENCOUNTER — Encounter (HOSPITAL_COMMUNITY): Payer: Self-pay

## 2015-04-12 DIAGNOSIS — E43 Unspecified severe protein-calorie malnutrition: Secondary | ICD-10-CM | POA: Diagnosis present

## 2015-04-12 DIAGNOSIS — Z992 Dependence on renal dialysis: Secondary | ICD-10-CM

## 2015-04-12 DIAGNOSIS — R946 Abnormal results of thyroid function studies: Secondary | ICD-10-CM | POA: Diagnosis present

## 2015-04-12 DIAGNOSIS — N189 Chronic kidney disease, unspecified: Secondary | ICD-10-CM

## 2015-04-12 DIAGNOSIS — N186 End stage renal disease: Secondary | ICD-10-CM | POA: Diagnosis not present

## 2015-04-12 DIAGNOSIS — B1920 Unspecified viral hepatitis C without hepatic coma: Secondary | ICD-10-CM | POA: Diagnosis present

## 2015-04-12 DIAGNOSIS — F17210 Nicotine dependence, cigarettes, uncomplicated: Secondary | ICD-10-CM | POA: Diagnosis present

## 2015-04-12 DIAGNOSIS — D649 Anemia, unspecified: Secondary | ICD-10-CM | POA: Diagnosis present

## 2015-04-12 DIAGNOSIS — R627 Adult failure to thrive: Secondary | ICD-10-CM | POA: Diagnosis present

## 2015-04-12 DIAGNOSIS — D631 Anemia in chronic kidney disease: Secondary | ICD-10-CM | POA: Diagnosis present

## 2015-04-12 DIAGNOSIS — K922 Gastrointestinal hemorrhage, unspecified: Secondary | ICD-10-CM | POA: Diagnosis present

## 2015-04-12 DIAGNOSIS — Z9115 Patient's noncompliance with renal dialysis: Secondary | ICD-10-CM

## 2015-04-12 DIAGNOSIS — Z96652 Presence of left artificial knee joint: Secondary | ICD-10-CM | POA: Diagnosis present

## 2015-04-12 DIAGNOSIS — R9431 Abnormal electrocardiogram [ECG] [EKG]: Secondary | ICD-10-CM | POA: Diagnosis present

## 2015-04-12 DIAGNOSIS — K921 Melena: Secondary | ICD-10-CM | POA: Diagnosis present

## 2015-04-12 DIAGNOSIS — D62 Acute posthemorrhagic anemia: Secondary | ICD-10-CM | POA: Diagnosis not present

## 2015-04-12 DIAGNOSIS — I4581 Long QT syndrome: Secondary | ICD-10-CM | POA: Diagnosis present

## 2015-04-12 DIAGNOSIS — N2581 Secondary hyperparathyroidism of renal origin: Secondary | ICD-10-CM | POA: Diagnosis present

## 2015-04-12 DIAGNOSIS — T68XXXA Hypothermia, initial encounter: Secondary | ICD-10-CM | POA: Diagnosis present

## 2015-04-12 DIAGNOSIS — G40909 Epilepsy, unspecified, not intractable, without status epilepticus: Secondary | ICD-10-CM | POA: Diagnosis present

## 2015-04-12 DIAGNOSIS — R680 Hypothermia, not associated with low environmental temperature: Secondary | ICD-10-CM | POA: Diagnosis present

## 2015-04-12 DIAGNOSIS — T68XXXD Hypothermia, subsequent encounter: Secondary | ICD-10-CM

## 2015-04-12 DIAGNOSIS — I10 Essential (primary) hypertension: Secondary | ICD-10-CM | POA: Diagnosis not present

## 2015-04-12 DIAGNOSIS — Z79899 Other long term (current) drug therapy: Secondary | ICD-10-CM | POA: Diagnosis not present

## 2015-04-12 DIAGNOSIS — B1910 Unspecified viral hepatitis B without hepatic coma: Secondary | ICD-10-CM | POA: Diagnosis present

## 2015-04-12 DIAGNOSIS — E1122 Type 2 diabetes mellitus with diabetic chronic kidney disease: Secondary | ICD-10-CM | POA: Diagnosis present

## 2015-04-12 DIAGNOSIS — Z720 Tobacco use: Secondary | ICD-10-CM | POA: Diagnosis present

## 2015-04-12 DIAGNOSIS — I5032 Chronic diastolic (congestive) heart failure: Secondary | ICD-10-CM | POA: Diagnosis present

## 2015-04-12 DIAGNOSIS — Z681 Body mass index (BMI) 19 or less, adult: Secondary | ICD-10-CM | POA: Diagnosis not present

## 2015-04-12 DIAGNOSIS — I132 Hypertensive heart and chronic kidney disease with heart failure and with stage 5 chronic kidney disease, or end stage renal disease: Secondary | ICD-10-CM | POA: Diagnosis present

## 2015-04-12 DIAGNOSIS — I119 Hypertensive heart disease without heart failure: Secondary | ICD-10-CM | POA: Diagnosis present

## 2015-04-12 LAB — PREPARE RBC (CROSSMATCH)

## 2015-04-12 LAB — APTT: APTT: 34 s (ref 24–37)

## 2015-04-12 LAB — BASIC METABOLIC PANEL
ANION GAP: 16 — AB (ref 5–15)
BUN: 109 mg/dL — ABNORMAL HIGH (ref 6–20)
CALCIUM: 7.2 mg/dL — AB (ref 8.9–10.3)
CO2: 22 mmol/L (ref 22–32)
CREATININE: 8.6 mg/dL — AB (ref 0.61–1.24)
Chloride: 105 mmol/L (ref 101–111)
GFR calc Af Amer: 7 mL/min — ABNORMAL LOW (ref >60)
GFR, EST NON AFRICAN AMERICAN: 6 mL/min — AB (ref >60)
Glucose, Bld: 92 mg/dL (ref 65–99)
Potassium: 5 mmol/L (ref 3.5–5.1)
SODIUM: 143 mmol/L (ref 135–145)

## 2015-04-12 LAB — HEPATIC FUNCTION PANEL
ALBUMIN: 2.1 g/dL — AB (ref 3.5–5.0)
ALT: 9 U/L — ABNORMAL LOW (ref 17–63)
AST: 14 U/L — ABNORMAL LOW (ref 15–41)
Alkaline Phosphatase: 35 U/L — ABNORMAL LOW (ref 38–126)
Bilirubin, Direct: <0.1 mg/dL — ABNORMAL LOW (ref 0.1–0.5)
TOTAL PROTEIN: 5 g/dL — AB (ref 6.5–8.1)
Total Bilirubin: 0.5 mg/dL (ref 0.3–1.2)

## 2015-04-12 LAB — CBC WITH DIFFERENTIAL/PLATELET
BASOS ABS: 0 10*3/uL (ref 0.0–0.1)
Basophils Relative: 1 %
EOS ABS: 0 10*3/uL (ref 0.0–0.7)
Eosinophils Relative: 1 %
HCT: 10.9 % — ABNORMAL LOW (ref 39.0–52.0)
Hemoglobin: 3.5 g/dL — CL (ref 13.0–17.0)
LYMPHS ABS: 0.7 10*3/uL (ref 0.7–4.0)
Lymphocytes Relative: 17 %
MCH: 29.2 pg (ref 26.0–34.0)
MCHC: 32.1 g/dL (ref 30.0–36.0)
MCV: 90.8 fL (ref 78.0–100.0)
Monocytes Absolute: 0.3 10*3/uL (ref 0.1–1.0)
Monocytes Relative: 7 %
NEUTROS ABS: 2.9 10*3/uL (ref 1.7–7.7)
Neutrophils Relative %: 74 %
Platelets: 155 10*3/uL (ref 150–400)
RBC: 1.2 MIL/uL — ABNORMAL LOW (ref 4.22–5.81)
RDW: 23 % — AB (ref 11.5–15.5)
WBC: 3.9 10*3/uL — AB (ref 4.0–10.5)

## 2015-04-12 LAB — RETICULOCYTES
RBC.: 1.2 MIL/uL — ABNORMAL LOW (ref 4.22–5.81)
Retic Count, Absolute: 99.6 10*3/uL (ref 19.0–186.0)
Retic Ct Pct: 8.3 % — ABNORMAL HIGH (ref 0.4–3.1)

## 2015-04-12 LAB — TSH: TSH: 6.152 u[IU]/mL — AB (ref 0.350–4.500)

## 2015-04-12 LAB — TROPONIN I: TROPONIN I: 0.03 ng/mL (ref <0.031)

## 2015-04-12 LAB — MRSA PCR SCREENING: MRSA BY PCR: NEGATIVE

## 2015-04-12 LAB — MAGNESIUM: Magnesium: 2 mg/dL (ref 1.7–2.4)

## 2015-04-12 LAB — POC OCCULT BLOOD, ED: Fecal Occult Bld: POSITIVE — AB

## 2015-04-12 LAB — PROTIME-INR
INR: 1.26 (ref 0.00–1.49)
Prothrombin Time: 16 seconds — ABNORMAL HIGH (ref 11.6–15.2)

## 2015-04-12 LAB — PHOSPHORUS: PHOSPHORUS: 7.7 mg/dL — AB (ref 2.5–4.6)

## 2015-04-12 MED ORDER — LIDOCAINE-PRILOCAINE 2.5-2.5 % EX CREA
1.0000 "application " | TOPICAL_CREAM | CUTANEOUS | Status: DC | PRN
  Filled 2015-04-12: qty 5

## 2015-04-12 MED ORDER — LORAZEPAM 2 MG/ML IJ SOLN
0.0000 mg | Freq: Four times a day (QID) | INTRAMUSCULAR | Status: DC
Start: 2015-04-12 — End: 2015-04-12

## 2015-04-12 MED ORDER — SODIUM CHLORIDE 0.9 % IV SOLN: 100.0000 mL | INTRAVENOUS | Status: DC | PRN

## 2015-04-12 MED ORDER — PEG-KCL-NACL-NASULF-NA ASC-C 100 G PO SOLR
0.5000 | Freq: Once | ORAL | Status: DC
  Filled 2015-04-12: qty 1

## 2015-04-12 MED ORDER — DARBEPOETIN ALFA 200 MCG/0.4ML IJ SOSY: 200.0000 ug | PREFILLED_SYRINGE | INTRAMUSCULAR | Status: DC

## 2015-04-12 MED ORDER — LORAZEPAM 1 MG PO TABS: 1.0000 mg | ORAL_TABLET | Freq: Four times a day (QID) | ORAL | Status: DC | PRN

## 2015-04-12 MED ORDER — SODIUM CHLORIDE 0.9 % IJ SOLN: 3.0000 mL | Freq: Two times a day (BID) | INTRAMUSCULAR | Status: DC

## 2015-04-12 MED ORDER — PENTAFLUOROPROP-TETRAFLUOROETH EX AERO: 1.0000 "application " | INHALATION_SPRAY | CUTANEOUS | Status: DC | PRN

## 2015-04-12 MED ORDER — LOSARTAN POTASSIUM 50 MG PO TABS: 100.0000 mg | ORAL_TABLET | Freq: Every evening | ORAL | Status: DC

## 2015-04-12 MED ORDER — SODIUM CHLORIDE 0.9 % IV SOLN
80.0000 mg | Freq: Once | INTRAVENOUS | Status: AC
  Administered 2015-04-12: 80 mg via INTRAVENOUS
  Filled 2015-04-12: qty 80

## 2015-04-12 MED ORDER — LORAZEPAM 2 MG/ML IJ SOLN: 1.0000 mg | Freq: Four times a day (QID) | INTRAMUSCULAR | Status: DC | PRN

## 2015-04-12 MED ORDER — SODIUM CHLORIDE 0.9 % IV SOLN: 10.0000 mL/h | Freq: Once | INTRAVENOUS | Status: DC

## 2015-04-12 MED ORDER — HYDRALAZINE HCL 100 MG PO TABS: 100.0000 mg | ORAL_TABLET | Freq: Two times a day (BID) | ORAL | Status: DC

## 2015-04-12 MED ORDER — FOLIC ACID 1 MG PO TABS: 1.0000 mg | ORAL_TABLET | Freq: Every day | ORAL | Status: DC

## 2015-04-12 MED ORDER — ALTEPLASE 2 MG IJ SOLR
2.0000 mg | Freq: Once | INTRAMUSCULAR | Status: DC | PRN
  Filled 2015-04-12: qty 2

## 2015-04-12 MED ORDER — BOOST / RESOURCE BREEZE PO LIQD: 1.0000 | Freq: Two times a day (BID) | ORAL | Status: DC

## 2015-04-12 MED ORDER — DOXERCALCIFEROL 4 MCG/2ML IV SOLN
3.0000 ug | INTRAVENOUS | Status: DC
  Administered 2015-04-12: 3 ug via INTRAVENOUS
  Filled 2015-04-12 (×2): qty 2

## 2015-04-12 MED ORDER — DOXERCALCIFEROL 4 MCG/2ML IV SOLN
INTRAVENOUS | Status: AC
  Filled 2015-04-12: qty 2

## 2015-04-12 MED ORDER — DIPHENHYDRAMINE HCL 50 MG/ML IJ SOLN
12.5000 mg | Freq: Once | INTRAMUSCULAR | Status: AC
  Administered 2015-04-12: 12.5 mg via INTRAVENOUS
  Filled 2015-04-12: qty 1

## 2015-04-12 MED ORDER — NICOTINE 21 MG/24HR TD PT24
21.0000 mg | MEDICATED_PATCH | TRANSDERMAL | Status: DC
  Administered 2015-04-12: 21 mg via TRANSDERMAL
  Filled 2015-04-12: qty 1

## 2015-04-12 MED ORDER — SODIUM CHLORIDE 0.9 % IV SOLN
500.0000 mg | Freq: Two times a day (BID) | INTRAVENOUS | Status: DC
  Administered 2015-04-12: 500 mg via INTRAVENOUS
  Filled 2015-04-12 (×2): qty 5

## 2015-04-12 MED ORDER — RENA-VITE PO TABS: 1.0000 | ORAL_TABLET | Freq: Every day | ORAL | Status: DC

## 2015-04-12 MED ORDER — HYDRALAZINE HCL 50 MG PO TABS
100.0000 mg | ORAL_TABLET | Freq: Two times a day (BID) | ORAL | Status: DC
  Administered 2015-04-12: 100 mg via ORAL
  Filled 2015-04-12: qty 2

## 2015-04-12 MED ORDER — PEG-KCL-NACL-NASULF-NA ASC-C 100 G PO SOLR: 1.0000 | Freq: Once | ORAL | Status: DC

## 2015-04-12 MED ORDER — LORAZEPAM 2 MG/ML IJ SOLN: 0.0000 mg | Freq: Two times a day (BID) | INTRAMUSCULAR | Status: DC

## 2015-04-12 MED ORDER — THIAMINE HCL 100 MG/ML IJ SOLN: 100.0000 mg | Freq: Every day | INTRAMUSCULAR | Status: DC

## 2015-04-12 MED ORDER — DESMOPRESSIN ACETATE 4 MCG/ML IJ SOLN
20.0000 ug | Freq: Once | INTRAMUSCULAR | Status: AC
  Administered 2015-04-12: 20 ug via INTRAVENOUS
  Filled 2015-04-12: qty 5

## 2015-04-12 MED ORDER — SODIUM CHLORIDE 0.9 % IV SOLN
8.0000 mg/h | INTRAVENOUS | Status: DC
  Administered 2015-04-12: 8 mg/h via INTRAVENOUS
  Filled 2015-04-12 (×3): qty 80

## 2015-04-12 MED ORDER — LIDOCAINE HCL (PF) 1 % IJ SOLN: 5.0000 mL | INTRAMUSCULAR | Status: DC | PRN

## 2015-04-12 MED ORDER — ADULT MULTIVITAMIN W/MINERALS CH: 1.0000 | ORAL_TABLET | Freq: Every day | ORAL | Status: DC

## 2015-04-12 MED ORDER — PANTOPRAZOLE SODIUM 40 MG IV SOLR: 40.0000 mg | Freq: Two times a day (BID) | INTRAVENOUS | Status: DC

## 2015-04-12 MED ORDER — SODIUM CHLORIDE 0.9 % IV SOLN
Freq: Once | INTRAVENOUS | Status: AC
  Administered 2015-04-12: 10:00:00 via INTRAVENOUS

## 2015-04-12 MED ORDER — SODIUM CHLORIDE 0.9 % IV BOLUS (SEPSIS): 1000.0000 mL | Freq: Once | INTRAVENOUS | Status: DC

## 2015-04-12 MED ORDER — VITAMIN B-1 100 MG PO TABS: 100.0000 mg | ORAL_TABLET | Freq: Every day | ORAL | Status: DC

## 2015-04-12 MED ORDER — DOXERCALCIFEROL 4 MCG/2ML IV SOLN
5.0000 ug | INTRAVENOUS | Status: DC
  Filled 2015-04-12: qty 4

## 2015-04-12 MED ORDER — PANTOPRAZOLE SODIUM 40 MG IV SOLR
40.0000 mg | Freq: Once | INTRAVENOUS | Status: AC
  Administered 2015-04-12: 40 mg via INTRAVENOUS
  Filled 2015-04-12: qty 40

## 2015-04-12 NOTE — Progress Notes (Signed)
Note blood pressure creeping up. Has received transfusion during hemodialysis and is stable post-hemodialysis so we'll resume hydralazine but continue to hold Cozaar for now. TSH noted to be slightly elevated at 6.152 so we'll check free T4 and T3. Suspect secondary to sick euthyroid syndrome.  Erin Hearing, ANP

## 2015-04-12 NOTE — Progress Notes (Signed)
Was called by nursing that patient was adamant to leave post hemodialysis. Came and spoke to patient advised him that it was not advisable for him to leave as was having an ongoing GI bleed and could bleed to death. Patient also noted to have systolic blood pressures in the 200s. Patient is currently competent and understands the risks of leaving including of worsening medical condition and death. Patient understands and states that he still is going to leave [place].

## 2015-04-12 NOTE — Progress Notes (Addendum)
Dialysis treatment completed.  2843 mL ultrafiltrated and net fluid removal 1600 mL.    Tx terminated early per pt.  Physician at bedside, verbal order received to terminate early. Patient status unchanged. Lung sounds diminished to ausculation in all fields. No edema. Cardiac: Regular R&R.  Disconnected lines and removed needles.  Pressure held for 10 minutes and band aid/gauze dressing applied.  Report given to bedside RN, Carolynn Serve.

## 2015-04-12 NOTE — Progress Notes (Signed)
Pt received from HD very irritable and insisting that he wants to go home. Informed pt that he could not leave yet, still need one more unit of blood. Pt observed trying to get oob x2. MD paged. Asked to come to bedside to speak with pt and reassess. Reiterated the need for pt to stay and receive treatment.. Explained still needs one more unit of PRBC and bp is high in the 200's. And that he could possibly have a stroke or die  If he leaves.Pt verbalized  understanding. MD at bedside. Spoke with Pt and pt insists on leaving. MD stated to give pt  AMA form. AMA form signed by pt. Elink and [place] notified. IV access discontinued, pressure held to site for few minutes. Will cont to monitor   Lab here to get post transfusion CBC, pt refused. Pt remains alert and oriented prior to leaving. Asked how he was going to get home, pt stated that he had a ride. Pt transported via wheelchair and discharged home.

## 2015-04-12 NOTE — ED Notes (Addendum)
Pt from dialysis with [facility], pt had blood drawn yesterday and got results this morning before dialysis and his hgb was 6. Pt was immediately transported here. Pt states he feels weak but denies pain or dizziness. Pt also reports having blood in stools recently

## 2015-04-12 NOTE — Discharge Summary (Signed)
Physician Discharge Summary  [name] MPN:[id number] DOB: 1955-06-13 DOA: 04/12/2015  PCP: No primary care provider on file.  Admit date: 04/12/2015 Discharge date: 04/12/2015  Time spent: 30 minutes  Recommendations for Outpatient Follow-up:  LEFT AMA  Discharge Diagnoses:  Principal Problem:   Acute GI bleeding Active Problems:   Acute blood loss anemia   Anemia of chronic kidney failure   Hypothermia   Hypertension   ESRD on hemodialysis (HCC)   LVH (left ventricular hypertrophy) due to hypertensive disease   Prolonged Q-T interval on ECG   Seizure disorder (HCC)   Tobacco abuse   Protein-calorie malnutrition, severe (HCC)   Bleeding gastrointestinal   Symptomatic anemia    Filed Weights   04/12/15 0920 04/12/15 1202 04/12/15 1448  Weight: 118 lb 13.3 oz (53.9 kg) 120 lb 5.9 oz (54.6 kg) 116 lb 13.5 oz (53 kg)    History of present illness:  59 year old male patient with multiple medical problems including chronic kidney disease on dialysis, severe LVH in setting of hypertension, history of polysubstance abuse including tobacco and cocaine, anemia of chronic kidney disease, extensive anemia workup to rule out GI causes in 2015 with abnormal bone marrow biopsy with minor B cell felt not to represent malignancy, severe protein calorie malnutrition, history of hepatitis C antibody and hepatitis B core antibody positive, history of prior H pylori gastritis previous episodic GI bleed, seizure disorder who was sent to the ER after being formed his hemoglobin had dropped (labs obtained at dialysis Center). Several days ago the patient's hemoglobin was around 6 and repeat today revealed a hemoglobin of 3.5. Patient reports that over the past week he has had dark stools although he did not pay much attention to them. The past 2 days he has had melena as well as bright red blood per rectum with some clotting but small volume. Patient currently denying chest pain, shortness of breath  or abdominal pain. He reports weight loss of 10 pounds over the past one week. Reports poor appetite. Typically eats soup and sandwich type meals  ER Evaluation and treatment: Portable chest x-ray unremarkable EKG sinus rhythm with ventricular rate 68 bpm, QTC 585 ms, marked voltage criteria for LVH, no ischemic changes Normotensive but initial rectal temperature low at 94.5 with repeat check after Bair warmer applied 97.4 orally BUN 109 and creatinine 8.6 with baseline BUN 41 and creatinine 7.68 Hemoglobin 3.5, hematocrit 10.9, MCV 90.8, WBCs 3900 with normal absolute neutrophils Platelets 155,000, PT 16 with INR 1.26 and PTT 34 2 units PRBCs ordered Protonix 40 mg IV  Hospital Course:  Patient was admitted. Please refer to H/P assessment and plan for details. Was evaluated by gastroenterology who recommended DDAVP for uremic bleeding and a Protonix drip with plans to pursue EGD and colonoscopy under anesthesia during the hospitalization. Because of significant anemia 4 units of blood were ordered for transfusion at time of admission. In review of intake and output patient received a total of 2-1/2 units of blood during hemodialysis. Patient did go to hemodialysis but requested to be removed from the machine early. A total of 2843 mL was ultrafiltrate it with a net fluid removal of 1600 mL. Patient returned to unit 2H and it was noted that his blood pressures were progressively increasing. Orders were placed to resume patient's hydralazine with consideration for resuming Cozaar if blood pressure continued to increase. My attending physician Dr. Grandville Silos received a phone call from nursing staff the patient was adamant to leave [place]  post-hemodialysis. The physician came to the unit and directly spoke with the patient and advised him that given his current physical status including uncontrolled high blood pressure and ongoing GI bleed that it was inappropriate for him to leave and would  put his life in danger. Risks of uncontrolled hypertension as well as ongoing GI bleeding and possibility of hemorrhaging to death were discussed with the patient. Patient was deemed competent and understood that the risk of leaving included worsening medical condition and death and still stated he requested to leave [place]. Paperwork was signed and patient left the hospital. Suspect patient's desire to leave [place] contributed to lack of appropriate blood transfusion completion. Of note patient was found to have an abnormal TSH this admission of 6.152. Free T4 and T3 were ordered but were never obtained.  Procedures:  Hemodialysis as described above  Consultations: [facility]; [place]/ Nephrology  Discharge Exam: Filed Vitals:   04/12/15 1530 04/12/15 1545  BP: 212/71 215/71  Pulse: 85 85  Temp:    Resp: 19 21     Discharge Instructions None given since patient left AMA       The results of significant diagnostics from this hospitalization (including imaging, microbiology, ancillary and laboratory) are listed below for reference.    Significant Diagnostic Studies: Dg Chest Port 1 View  04/12/2015  CLINICAL DATA:  Hypothermia, chronic diastolic congestive heart failure. EXAM: PORTABLE CHEST 1 VIEW COMPARISON:  April 22, 2014 FINDINGS: The heart size and mediastinal contours are stable. Both lungs are clear. The visualized skeletal structures are stable. IMPRESSION: No active cardiopulmonary disease. Electronically Signed   By: Abelardo Diesel M.D.   On: 04/12/2015 08:34    Microbiology: Recent Results (from the past 240 hour(s))  MRSA PCR Screening     Status: None   Collection Time: 04/12/15  9:19 AM  Result Value Ref Range Status   MRSA by PCR NEGATIVE NEGATIVE Final    Comment:        The GeneXpert MRSA Assay (FDA approved for NASAL specimens only), is one component of a comprehensive MRSA colonization surveillance  program. It is not intended to diagnose MRSA infection nor to guide or monitor treatment for MRSA infections.      Labs: Basic Metabolic Panel:  Recent Labs Lab 04/12/15 0657  NA 143  K 5.0  CL 105  CO2 22  GLUCOSE 92  BUN 109*  CREATININE 8.60*  CALCIUM 7.2*  MG 2.0  PHOS 7.7*   Liver Function Tests:  Recent Labs Lab 04/12/15 0657  AST 14*  ALT 9*  ALKPHOS 35*  BILITOT 0.5  PROT 5.0*  ALBUMIN 2.1*   No results for input(s): LIPASE, AMYLASE in the last 168 hours. No results for input(s): AMMONIA in the last 168 hours. CBC:  Recent Labs Lab 04/12/15 0657  WBC 3.9*  NEUTROABS 2.9  HGB 3.5*  HCT 10.9*  MCV 90.8  PLT 155   Cardiac Enzymes:  Recent Labs Lab 04/12/15 0657  TROPONINI 0.03   BNP: BNP (last 3 results)  Recent Labs  04/21/14 0033  BNP 1641.4*    ProBNP (last 3 results) No results for input(s): PROBNP in the last 8760 hours.  CBG: No results for input(s): GLUCAP in the last 168 hours.     Signed:  ELLIS,ALLISON L. ANP  [facility] 04/12/2015, 4:39 PM

## 2015-04-12 NOTE — Consult Note (Signed)
[facility] Renal Consultation Note    Indication for Consultation:  Management of ESRD/hemodialysis; anemia, hypertension/volume and secondary hyperparathyroidism PCP: None  HPI: [name] is a 59 y.o. male on TTS dialysis at [facility] who presented to HD this am weak, dizzy and unstable for dialysis.  He last dialyzed 12/22 for 2.75 hours and got to post weight of 55.5 net UF 2.6.  Pre and post BP were in the usual range. Hgb upon presentation to the ED today was 3.5. K was 5 BUN 109 consistent with acute GIB.  He has had significant workup by hematology and GI well documented by the admitting MD. Outpt Hgb trend the past month has been:  8.7 12/3, 9.5 12/10, 8.2 12/15 and 6 12/22.  Last tsat was 34% in November  Ferritin 1166 12/03. He has been on increasing dosing of Mircera 100 11/25, 150 11/29, 150 12/15 and 200 12/22.  His last IV Fe was given in November. He denies N, V, D, fever, chills.  He has no SOB at rest, but has DOE with fatigue, feels cold, was dizzy, ears were ringing earlier today. Stools have been melanotic the past few days but he admits to not looking at them before. [name] due for HD today   Past Medical History  Diagnosis Date  . Hypertension   . Anemia, chronic disease     a. Capsule endoscopy reportedly negative 08/2013. b. seen by heme with concern for minor B cell population on BMB, being observed.  . Chronic diastolic CHF (congestive heart failure) ([place])     a. EF 60 - 65% per [place] echo 11/2011. b. Echo 02/2012: severe LVH, focal basal hypertrophy, EF 60-65%, mild Mr.  . Cocaine abuse     mentioned in notes from [place]  . Hepatitis C antibody test positive     was HIV negative, 02/28/12  . Hepatitis B core antibody positive     03/01/10  . Positive QuantiFERON-TB Gold test     11/2011  . Helicobacter pylori gastritis     not defined if this was treated  . Polyp of colon, adenomatous     May 2012.  Dr Trenton Founds in [place]  .  Hematochezia   . Head injury, closed, with concussion ([place])   . History of blood transfusion   . Arthritis     "right shoulder" (11/09/2012)  . Seizure disorder [facility])     questionable history of - will need to clarify with PCP  . Headache(784.0)   . ESRD on hemodialysis ([place]) since 2012    a. Since 2012. ESRD was due to "drugs", primarily used cocaine.  Has 3-5 year hx of HTN, no DM.  Gets HD on TTS schedule at [place]. Originally from [place].   . Hypercalcemia   . Hyperpotassemia   . Tobacco abuse   . Protein-calorie malnutrition, severe ([place])   . Optic neuritis, left   . Hypertensive heart disease   . GI bleed 05/04/2012   Past Surgical History  Procedure Laterality Date  . Shoulder open rotator cuff repair Right   . Total knee arthroplasty Left   . Bascilic vein transposition  03/07/2012    Procedure: BASCILIC VEIN TRANSPOSITION;  Surgeon: Conrad Milner, MD;  Location: [place];  Service: Vascular;  Laterality: Left;  First Stage  . Insertion of dialysis catheter      right chest  . Bascilic vein transposition Left 05/31/2012    Procedure: [place];  Surgeon: Conrad [place], MD;  Location: [place];  Service: Vascular;  Laterality: Left;  Left 2nd Stage Basilic Vein Transposition with gortex graft revision using 88mx10cm graft  . Shoulder open rotator cuff repair Right   . [name] capsule study N/A 08/27/2013    Procedure: [name] CAPSULE STUDY;  Surgeon: DMilus Banister MD;  Location: [place]  Service: Endoscopy;  Laterality: N/A;   Family History  Problem Relation Age of Onset  . Diabetes Mother   . Hypertension Mother   . Stroke Mother   . Kidney failure Mother   . Cancer Father    Social History:  reports that he has been smoking Cigarettes.  He has a 25 pack-year smoking history. He has never used smokeless tobacco. He reports that he does not drink alcohol or use illicit drugs. Allergies  Allergen Reactions  . Reglan [Metoclopramide] Other (See  Comments)    Tardive dyskinesia in 11/2011 in [place]  Prior to Admission medications   Medication Sig Start Date End Date Taking? Authorizing Provider  calcium acetate (PHOSLO) 667 MG capsule Take 2,001 mg by mouth 3 (three) times daily with meals.    Yes Historical Provider, MD  hydrALAZINE (APRESOLINE) 100 MG tablet Take 100 mg by mouth 2 (two) times daily. DON'T TAKE ON DIALYSIS DAYS   Yes Historical Provider, MD  levETIRAcetam (KEPPRA) 1000 MG tablet Take 1 tablet (1,000 mg total) by mouth daily. 04/25/14  Yes LLuan Moore MD  levETIRAcetam (KEPPRA) 500 MG tablet Take 1 tablet (500 mg total) by mouth Every Tuesday,Thursday,and Saturday with dialysis. 04/25/14  Yes LLuan Moore MD  losartan (COZAAR) 100 MG tablet Take 100 mg by mouth every evening. 10/02/14  Yes Historical Provider, MD  multivitamin (RENA-VIT) TABS tablet Take 1 tablet by mouth at bedtime. 04/25/14  Yes LLuan Moore MD  Darbepoetin Alfa (ARANESP) 200 MCG/0.4ML SOSY injection Inject 0.4 mLs (200 mcg total) into the vein every Tuesday with hemodialysis. 04/09/14   JJones Bales MD  doxercalciferol (HECTOROL) 4 MCG/2ML injection Inject 1.5 mLs (3 mcg total) into the vein Every Tuesday,Thursday,and Saturday with dialysis. 04/07/14   JJones Bales MD  Nutritional Supplements (FEEDING SUPPLEMENT, NEPRO CARB STEADY,) LIQD Take 237 mLs by mouth 2 (two) times daily between meals. Patient not taking: Reported on 04/12/2015 04/07/14   JJones Bales MD   Current Facility-Administered Medications  Medication Dose Route Frequency Provider Last Rate Last Dose  . 0.9 %  sodium chloride infusion  10 mL/hr Intravenous Once DLeo Grosser MD      . [Derrill MemoON 04/15/2015] Darbepoetin Alfa (ARANESP) injection 200 mcg  200 mcg Intravenous Q Tue-HD ASamella Parr NP      . doxercalciferol (HECTOROL) injection 3 mcg  3 mcg Intravenous Q T,Th,Sa-HD ASamella Parr NP      . levETIRAcetam (KEPPRA) 500 mg in sodium chloride 0.9 % 100 mL IVPB   500 mg Intravenous Q12H ASamella Parr NP      . multivitamin (RENA-VIT) tablet 1 tablet  1 tablet Oral QHS ASamella Parr NP      . nicotine (NICODERM CQ - dosed in mg/24 hours) patch 21 mg  21 mg Transdermal Q24H DEugenie Filler MD      . pantoprazole (PROTONIX) 80 mg in sodium chloride 0.9 % 100 mL IVPB  80 mg Intravenous Once Kavitha Nandigam V, MD      . pantoprazole (PROTONIX) 80 mg in sodium chloride 0.9 % 250 mL (0.32 mg/mL) infusion  8 mg/hr Intravenous Continuous Mauri Pole, MD      . Derrill Memo ON 04/15/2015] pantoprazole (PROTONIX) injection 40 mg  40 mg Intravenous Q12H Kavitha Nandigam V, MD      . peg 3350 powder (MOVIPREP) kit 100 g  0.5 kit Oral Once [employer], RPH       And  . Derrill Memo ON 04/13/2015] peg 3350 powder (MOVIPREP) kit 100 g  0.5 kit Oral Once [employer], RPH      . sodium chloride 0.9 % injection 3 mL  3 mL Intravenous Q12H Samella Parr, NP       Labs: Basic Metabolic Panel:  Recent Labs Lab 04/12/15 0657  NA 143  K 5.0  CL 105  CO2 22  GLUCOSE 92  BUN 109*  CREATININE 8.60*  CALCIUM 7.2*  PHOS 7.7*   Liver Function Tests:  Recent Labs Lab 04/12/15 0657  AST 14*  ALT 9*  ALKPHOS 35*  BILITOT 0.5  PROT 5.0*  ALBUMIN 2.1*   CBC:  Recent Labs Lab 04/12/15 0657  WBC 3.9*  NEUTROABS 2.9  HGB 3.5*  HCT 10.9*  MCV 90.8  PLT 155   Cardiac Enzymes:  Recent Labs Lab 04/12/15 0657  TROPONINI 0.03   Studies/Results: Dg Chest Port 1 View  04/12/2015  CLINICAL DATA:  Hypothermia, chronic diastolic congestive heart failure. EXAM: PORTABLE CHEST 1 VIEW COMPARISON:  April 22, 2014 FINDINGS: The heart size and mediastinal contours are stable. Both lungs are clear. The visualized skeletal structures are stable. IMPRESSION: No active cardiopulmonary disease. Electronically Signed   By: Abelardo Diesel M.D.   On: 04/12/2015 08:34    ROS: As per HPI otherwise negative. Physical Exam: Filed Vitals:   04/12/15 1015  04/12/15 1030 04/12/15 1045 04/12/15 1100  BP: 132/65 142/59 140/57 142/70  Pulse: 71 69 71 70  Temp:      TempSrc:      Resp:      Height:      Weight:      SpO2: 100% 100% 100% 100%     General:  Thin AA M on HD NAD Head: Normocephalic, atraumatic, sclera non-icteric, mucus membranes are moist Neck: Supple. JVD not elevated. Lungs: Clear bilaterally to auscultation without wheezes, rales, or rhonchi. Breathing is unlabored. Heart: RRR 2-3/6 murmur Abdomen: Soft, non-tender, non-distended with normoactive bowel sounds. No rebound/guarding.  Lower extremities: without edema or ischemic changes, no open wounds  Neuro: Alert and oriented X 3. Moves all extremities spontaneously. Psych:  Responds to questions appropriately with a normal affect. Dialysis Access: left upper AVF Qb 400  Dialysis Orders: Center:[facility] 4 hr 180 400/A 1.5 EDW 51.5 3 K 2.25 Ca profile 2 heparin NONE Hectorol 5 Mircera 225 - last given 12/22 - no Fe Recent labs;  Hgb 6 12/22 34% sat 11/25 ferritin 1266 - last venofer 11/25 corr Ca ~8.2 P 2.3 12/15 iPTH 146 11/22   Assessment/Plan: 1. Acute GIB -Hgb has been steadily trending down the past 2 weeks despite being on steadily increasing doses of Mircera over the past month; he didn't notice change in stools until yesterday but "didn't look" GI consulted. Have ordered CBC about 2 hr post HD to c/w am CBC 2. ESRD -  TTS - normally on 3 K but K is high from GIB - so using 2 K bath Baseline BPs 140 - 180 pre HD and 120s post HD - attendance is erratic and RARELY staying on full treatment - Has  missed the last 3 of 6 treatments and stays on 2.75 -3.5 of 4 hour treatments- plan redrawn BMP and CBC 2 hour post HD and again in the am. He normally does use heparin during HD 3. Hypertension/volume  - BP stable; CXR neg upon admission;  EDW may need to be adjusted up at the time of discharge; Intermittently admits to taking BP meds at home: hydralazine 100 bid and losartan 100  q d - from outpt med list ; BP may ^^ once Hgb up 4. Metabolic bone disease -  Continue Hectorol; does not take binders 5. Nutrition - CL for now due to acute GIB - added resource supplements 6. Nonadherence to dialysis prescription 7. Substance abuse- contributing to poor attendance; denies tobacco or etoh, admits to cocaine  Myriam Jacobson, PA-C [place] [phone] 04/12/2015, 12:16 PM

## 2015-04-12 NOTE — Procedures (Signed)
I have seen and examined this patient and agree with the plan of care   No complaints  Appears to be doing well with no issues on dialysis  AVF  No flow issues today  Christeen Lai W 04/12/2015, 12:54 PM

## 2015-04-12 NOTE — Progress Notes (Signed)
Pt adamant about signing off dialysis.    Has gotten 3 of 4 units PRBC.  Can run the last unit in on the floor.  Repeat labs ordered.  Volume status ok.  Amalia Hailey, PA-C

## 2015-04-12 NOTE — ED Notes (Signed)
MD at bedside. 

## 2015-04-12 NOTE — H&P (Signed)
[facility] History and Physical                                                                                    [name], is a 59 y.o. male  MRN: [id number]   DOB - 03/24/1956  Admit Date - 04/12/2015  Outpatient Primary MD Althia Forts  Referring MD: Laneta Simmers / ER  Consulting M.D: [facility] Gastroenterology (called by EDP); [place]/ Nephrology  PMH: Past Medical History  Diagnosis Date  . Hypertension   . Anemia, chronic disease     a. Capsule endoscopy reportedly negative 08/2013. b. seen by heme with concern for minor B cell population on BMB, being observed.  . Chronic diastolic CHF (congestive heart failure) ([place])     a. EF 60 - 65% per [place] echo 11/2011. b. Echo 02/2012: severe LVH, focal basal hypertrophy, EF 60-65%, mild Mr.  . Cocaine abuse     mentioned in notes from [place]  . Hepatitis C antibody test positive     was HIV negative, 02/28/12  . Hepatitis B core antibody positive     03/01/10  . Positive QuantiFERON-TB Gold test     11/2011  . Helicobacter pylori gastritis     not defined if this was treated  . Polyp of colon, adenomatous     May 2012.  Dr Trenton Founds in [place]  . Hematochezia   . Head injury, closed, with concussion ([place])   . History of blood transfusion   . Arthritis     "right shoulder" (11/09/2012)  . Seizure disorder [facility])     questionable history of - will need to clarify with PCP  . Headache(784.0)   . ESRD on hemodialysis ([place]) since 2012    a. Since 2012. ESRD was due to "drugs", primarily used cocaine.  Has 3-5 year hx of HTN, no DM.  Gets HD on TTS schedule at [employer]. Originally from [place].   . Hypercalcemia   . Hyperpotassemia   . Tobacco abuse   . Protein-calorie malnutrition, severe ([place])   . Optic neuritis, left   . Hypertensive heart disease   . GI bleed 05/04/2012      PSH: Past Surgical History  Procedure Laterality Date  . Shoulder open rotator cuff repair Right   . Total knee  arthroplasty Left   . Bascilic vein transposition  03/07/2012    Procedure: BASCILIC VEIN TRANSPOSITION;  Surgeon: Conrad [place], MD;  Location: [facility];  Service: Vascular;  Laterality: Left;  First Stage  . Insertion of dialysis catheter      right chest  . Bascilic vein transposition Left 05/31/2012    Procedure: BASCILIC VEIN TRANSPOSITION;  Surgeon: Conrad [place], MD;  Location: [place];  Service: Vascular;  Laterality: Left;  Left 2nd Stage Basilic Vein Transposition with gortex graft revision using 9mx10cm graft  . Shoulder open rotator cuff repair Right   . [name] capsule study N/A 08/27/2013    Procedure: [name] CAPSULE STUDY;  Surgeon: DMilus Banister MD;  [place]  Service: Endoscopy;  Laterality: N/A;     CC:  Chief Complaint  Patient presents with  . low hemoglobin     .  Abnormal Lab  . Anemia     HPI: 59 year old male patient with multiple medical problems including chronic kidney disease on dialysis, severe LVH in setting of hypertension, history of polysubstance abuse including tobacco and cocaine, anemia of chronic kidney disease, extensive anemia workup to rule out GI causes in 2015 with abnormal bone marrow biopsy with minor B cell felt not to represent malignancy, severe protein calorie malnutrition, history of hepatitis C antibody and hepatitis B core antibody positive, history of prior H pylori gastritis previous episodic GI bleed, seizure disorder who was sent to the ER after being formed his hemoglobin had dropped (labs obtained at dialysis Center). Several days ago the patient's hemoglobin was around 6 and repeat today revealed a hemoglobin of 3.5. Patient reports that over the past week he has had dark stools although he did not pay much attention to them. The past 2 days he has had melena as well as bright red blood per rectum with some clotting but small volume. Patient currently denying chest pain, shortness of breath or abdominal pain. He reports weight  loss of 10 pounds over the past one week. Reports poor appetite. Typically eats soup and sandwich type meals  ER Evaluation and treatment: Portable chest x-ray unremarkable EKG sinus rhythm with ventricular rate 68 bpm, QTC 585 ms, marked voltage criteria for LVH, no ischemic changes Normotensive but initial rectal temperature low at 94.5 with repeat check after Bair warmer applied 97.4 orally BUN 109 and creatinine 8.6 with baseline BUN 41 and creatinine 7.68 Hemoglobin 3.5, hematocrit 10.9, MCV 90.8, WBCs 3900 with normal absolute neutrophils Platelets 155,000, PT 16 with INR 1.26 and PTT 34 2 units PRBCs ordered Protonix 40 mg IV     Review of Systems   In addition to the HPI above,  No Fever-chills, myalgias or other constitutional symptoms No Headache, changes with Vision or hearing, new weakness, tingling, numbness in any extremity, No reported problems swallowing food or Liquids, indigestion/reflux No Chest pain, Cough or Shortness of Breath, palpitations, orthopnea or DOE No Abdominal pain, N/V No dysuria, hematuria or flank pain No new skin rashes, lesions, masses or bruises, No new joints pains-aches No polyuria, polydypsia or polyphagia,  *A full 10 point Review of Systems was done, except as stated above, all other Review of Systems were negative.  Social History Social History  Substance Use Topics  . Smoking status: Current Every Day Smoker -- 1.00 packs/day for 25 years    Types: Cigarettes  . Smokeless tobacco: Never Used  . Alcohol Use: No     Comment: 11/09/2012 "been stopped drinking 1-2 yr ago"    Resides at: Private residence  Lives with: Lives alone  Ambulatory status: With a cane   Family History Family History  Problem Relation Age of Onset  . Diabetes Mother   . Hypertension Mother   . Stroke Mother   . Kidney failure Mother   . Cancer Father      Prior to Admission medications   Medication Sig Start Date End Date Taking? Authorizing  Provider  calcium acetate (PHOSLO) 667 MG capsule Take 2,001 mg by mouth 3 (three) times daily with meals.    Yes Historical Provider, MD  hydrALAZINE (APRESOLINE) 100 MG tablet Take 100 mg by mouth 2 (two) times daily. DON'T TAKE ON DIALYSIS DAYS   Yes Historical Provider, MD  levETIRAcetam (KEPPRA) 1000 MG tablet Take 1 tablet (1,000 mg total) by mouth daily. 04/25/14  Yes Luan Moore, MD  levETIRAcetam (KEPPRA) 500  MG tablet Take 1 tablet (500 mg total) by mouth Every Tuesday,Thursday,and Saturday with dialysis. 04/25/14  Yes Luan Moore, MD  losartan (COZAAR) 100 MG tablet Take 100 mg by mouth every evening. 10/02/14  Yes Historical Provider, MD  multivitamin (RENA-VIT) TABS tablet Take 1 tablet by mouth at bedtime. 04/25/14  Yes Luan Moore, MD  Darbepoetin Alfa (ARANESP) 200 MCG/0.4ML SOSY injection Inject 0.4 mLs (200 mcg total) into the vein every Tuesday with hemodialysis. 04/09/14   Jones Bales, MD  doxercalciferol (HECTOROL) 4 MCG/2ML injection Inject 1.5 mLs (3 mcg total) into the vein Every Tuesday,Thursday,and Saturday with dialysis. 04/07/14   Jones Bales, MD  Nutritional Supplements (FEEDING SUPPLEMENT, NEPRO CARB STEADY,) LIQD Take 237 mLs by mouth 2 (two) times daily between meals. Patient not taking: Reported on 04/12/2015 04/07/14   Jones Bales, MD    Allergies  Allergen Reactions  . Reglan [Metoclopramide] Other (See Comments)    Tardive dyskinesia in 11/2011 in [place]    Physical Exam  Vitals  Blood pressure 133/56, pulse 69, temperature 97.4 F (36.3 C), temperature source Oral, resp. rate 13, height 5' 9"  (1.753 m), weight 135 lb (61.236 kg), SpO2 100 %.   General:  In no acute distress, appears chronically ill and quite malnourished  Psych: Flat affect, Denies Suicidal or Homicidal ideations, Awake, Oriented X 3. Speech and thought patterns are clear and appropriate, no apparent short term memory deficits  Neuro:   No focal neurological deficits, CN  II through XII intact, Strength 5/5 all 4 extremities, Sensation intact all 4 extremities.  ENT:  Ears and Eyes appear Normal, Conjunctivae clear, sclerae yellow discolored PER. Dry oral mucosa without erythema or exudates.  Neck:  Supple, No lymphadenopathy appreciated  Respiratory:  Symmetrical chest wall movement, Good air movement bilaterally, CTAB. Room Air  Cardiac:  RRR, No Murmurs, no LE edema noted, no JVD, No carotid bruits, peripheral pulses palpable at 2+  Abdomen:  Positive bowel sounds, Soft, Non tender, Non distended,  No masses appreciated, no obvious hepatosplenomegaly; noted with melanotic stool with clots during my exam  Skin:  No Cyanosis, Normal Skin Turgor, No Skin Rash or Bruise. Scalene appearance to bilateral lower extremities consistent with prior edema  Extremities: Symmetrical without obvious trauma or injury,  no effusions.  Data Review  CBC  Recent Labs Lab 04/12/15 0657  WBC 3.9*  HGB 3.5*  HCT 10.9*  PLT 155  MCV 90.8  MCH 29.2  MCHC 32.1  RDW 23.0*  LYMPHSABS 0.7  MONOABS 0.3  EOSABS 0.0  BASOSABS 0.0    Chemistries   Recent Labs Lab 04/12/15 0657  NA 143  K 5.0  CL 105  CO2 22  GLUCOSE 92  BUN 109*  CREATININE 8.60*  CALCIUM 7.2*  AST 14*  ALT 9*  ALKPHOS 35*  BILITOT 0.5    estimated creatinine clearance is 8.2 mL/min (by C-G formula based on Cr of 8.6).  No results for input(s): TSH, T4TOTAL, T3FREE, THYROIDAB in the last 72 hours.  Invalid input(s): FREET3  Coagulation profile  Recent Labs Lab 04/12/15 0657  INR 1.26    No results for input(s): DDIMER in the last 72 hours.  Cardiac Enzymes  Recent Labs Lab 04/12/15 0657  TROPONINI 0.03    Invalid input(s): POCBNP  Urinalysis    Component Value Date/Time   COLORURINE YELLOW 11/07/2012 1903   APPEARANCEUR CLEAR 11/07/2012 1903   LABSPEC 1.012 11/07/2012 1903   PHURINE 8.5* 11/07/2012 1903  GLUCOSEU 250* 11/07/2012 1903   HGBUR NEGATIVE  11/07/2012 1903   BILIRUBINUR NEGATIVE 11/07/2012 1903   KETONESUR NEGATIVE 11/07/2012 1903   PROTEINUR >300* 11/07/2012 1903   UROBILINOGEN 0.2 11/07/2012 1903   NITRITE NEGATIVE 11/07/2012 1903   LEUKOCYTESUR NEGATIVE 11/07/2012 1903    Imaging results:   Dg Chest Port 1 View  04/12/2015  CLINICAL DATA:  Hypothermia, chronic diastolic congestive heart failure. EXAM: PORTABLE CHEST 1 VIEW COMPARISON:  April 22, 2014 FINDINGS: The heart size and mediastinal contours are stable. Both lungs are clear. The visualized skeletal structures are stable. IMPRESSION: No active cardiopulmonary disease. Electronically Signed   By: Abelardo Diesel M.D.   On: 04/12/2015 08:34     EKG: (Independently reviewed)  sinus rhythm with ventricular rate 68 bpm, QTC 585 ms, marked voltage criteria for LVH, no ischemic changes   Assessment & Plan  Principal Problem:   Acute lower GI bleeding -SDU -Painless lower GI bleeding so suspect diverticular etiology -BUN also markedly elevated so as precaution initiate PPI twice a day -h/o H Pylori so ck serologies -Await GI evaluation -NPO -Hemodynamically stable so no IV fluids since dialysis patient  Active Problems:   Acute blood loss anemia -Transfuse 2 units PRBCs -CBC posttransfusion and in am -Baseline hemoglobin between 8 and 9 in January 2016 -Check anemia panel and TSH    Anemia of chronic kidney failure -Continue preadmission Aranesp    Hypothermia -Secondary to hypoperfusion in the setting of profound anemia -Resolved with warming blanket monitor closely    Hypertension -Blood pressure well controlled and in setting of anemia we'll hold any preadmission hypertensive medications initially (Cozaar)    ESRD on hemodialysis  -Dialyzes at [facility] -Nephrology notified of admission    LVH (left ventricular hypertrophy) due to hypertensive disease -Last echo 2013 so we will repeat this admission    Prolonged Q-T interval on  ECG -Repeat EKG in a.m. -Previous QTCs have been greater than 450 but less than 500 -Avoid offending medications -Possibly related to cardiac remodeling with known LVH    Seizure disorder  -Note 2 different drug dosages for Keppra on medication reconciliation -We'll start with lower dose and give 500 mg IV every 12 hours -Check Levetiracetam levels    Tobacco abuse/? Ongoing polysubstance abuse -Admits to 1 pack per day -Patient denied current use crack cocaine or other substances    Protein-calorie malnutrition, severe  -Chronic ongoing issue -Once GI evaluation complete will need nutritional evaluation -ck Mg and phosphorus    DVT Prophylaxis: SCDs  Family Communication:   No family at bedside  Code Status:  Full code  Condition:  Guarded  Discharge disposition: Anticipate discharge back to home environment pending medical stability-given appearance of failure to thrive and severe protein calorie malnutrition likely will benefit from formal PT/OT evaluation prior to discharge  Time spent in minutes : 60      ELLIS,ALLISON L. ANP on 04/12/2015 at 9:02 AM  You may contact me by going to [email or web address] - password [email or web address]  I am available from 7a-7p but please confirm I am on the schedule by going to Amion as above.   After 7p please contact night coverage person covering me after hours  [facility]

## 2015-04-12 NOTE — Consult Note (Signed)
Consultation  Referring Provider:   ER   Primary Care Physician:  Lynne Logan, MD Primary Gastroenterologist:        Dr Henrene Pastor Reason for Consultation:     Melena, anemia            HPI:   [name] is a 59 y.o. male  with multiple significant medical problems including end-stage renal disease  on hemodialysis [facility], hepatitis C, diabetes, alcohol and cocaine abuse, and medical noncompliance. [name] known to have chronic anemia. . Prior multiple GI evaluations due to the profound nature of his anemia and intermittent documentation of Hemoccult-positive stool. Prior endoscopic evaluations elsewhere have included EGD in 2011 (gastritis), and 2012 (small hiatal hernia). Colonoscopy in May of 2012 with small tubular adenoma, push enteroscopy December 2012 which was unremarkable and capsule endoscopy in may 2015 showed non specific erythema and edema in mid small bowel  . Seen several times in the hospital for profound anemia. Last seen by Hem Onc in July 2015.bone marrow biopsy with concern for B cell lymphoproliferative process,  and saw Dr Henrene Pastor in July 2015 as well. Patient's not sure about all of his medications. He does miss dialysis sessions a few times per month, according to him. He has been lost to follow up as well. He reports having dark stools with increased stool frequency for past few days.  No abdominal pain, nausea or vomiting. Denies NSAID's  Past Medical History  Diagnosis Date  . Hypertension   . Anemia, chronic disease     a. Capsule endoscopy reportedly negative 08/2013. b. seen by heme with concern for minor B cell population on BMB, being observed.  . Chronic diastolic CHF (congestive heart failure) ([place])     a. EF 60 - 65% per [place] echo 11/2011. b. Echo 02/2012: severe LVH, focal basal hypertrophy, EF 60-65%, mild Mr.  . Cocaine abuse     mentioned in notes from [place]  . Hepatitis C antibody test positive     was HIV negative, 02/28/12  . Hepatitis B core  antibody positive     03/01/10  . Positive QuantiFERON-TB Gold test     11/2011  . Helicobacter pylori gastritis     not defined if this was treated  . Polyp of colon, adenomatous     May 2012.  Dr Trenton Founds in [place]  . Hematochezia   . Head injury, closed, with concussion ([place])   . History of blood transfusion   . Arthritis     "right shoulder" (11/09/2012)  . Seizure disorder [facility])     questionable history of - will need to clarify with PCP  . Headache(784.0)   . ESRD on hemodialysis ([place]) since 2012    a. Since 2012. ESRD was due to "drugs", primarily used cocaine.  Has 3-5 year hx of HTN, no DM.  Gets HD on TTS schedule at [facility]. Originally from [place].   . Hypercalcemia   . Hyperpotassemia   . Tobacco abuse   . Protein-calorie malnutrition, severe ([place])   . Optic neuritis, left   . Hypertensive heart disease   . GI bleed 05/04/2012    Past Surgical History  Procedure Laterality Date  . Shoulder open rotator cuff repair Right   . Total knee arthroplasty Left   . Bascilic vein transposition  03/07/2012    Procedure: BASCILIC VEIN TRANSPOSITION;  Surgeon: Conrad Brookston, MD;  Location: [facility];  Service: Vascular;  Laterality: Left;  First  Stage  . Insertion of dialysis catheter      right chest  . Bascilic vein transposition Left 05/31/2012    Procedure: BASCILIC VEIN TRANSPOSITION;  Surgeon: Conrad Norwich, MD;  Location: [facility];  Service: Vascular;  Laterality: Left;  Left 2nd Stage Basilic Vein Transposition with gortex graft revision using 73mx10cm graft  . Shoulder open rotator cuff repair Right   . [name] capsule study N/A 08/27/2013    Procedure: [name] CAPSULE STUDY;  Surgeon: DMilus Banister MD;  [place]  Service: Endoscopy;  Laterality: N/A;    Family History  Problem Relation Age of Onset  . Diabetes Mother   . Hypertension Mother   . Stroke Mother   . Kidney failure Mother   . Cancer Father      Social History  Substance Use  Topics  . Smoking status: Current Every Day Smoker -- 1.00 packs/day for 25 years    Types: Cigarettes  . Smokeless tobacco: Never Used  . Alcohol Use: No     Comment: 11/09/2012 "been stopped drinking 1-2 yr ago"    Prior to Admission medications   Medication Sig Start Date End Date Taking? Authorizing Provider  calcium acetate (PHOSLO) 667 MG capsule Take 2,001 mg by mouth 3 (three) times daily with meals.    Yes Historical Provider, MD  hydrALAZINE (APRESOLINE) 100 MG tablet Take 100 mg by mouth 2 (two) times daily. DON'T TAKE ON DIALYSIS DAYS   Yes Historical Provider, MD  levETIRAcetam (KEPPRA) 1000 MG tablet Take 1 tablet (1,000 mg total) by mouth daily. 04/25/14  Yes LLuan Moore MD  levETIRAcetam (KEPPRA) 500 MG tablet Take 1 tablet (500 mg total) by mouth Every Tuesday,Thursday,and Saturday with dialysis. 04/25/14  Yes LLuan Moore MD  losartan (COZAAR) 100 MG tablet Take 100 mg by mouth every evening. 10/02/14  Yes Historical Provider, MD  multivitamin (RENA-VIT) TABS tablet Take 1 tablet by mouth at bedtime. 04/25/14  Yes LLuan Moore MD  Darbepoetin Alfa (ARANESP) 200 MCG/0.4ML SOSY injection Inject 0.4 mLs (200 mcg total) into the vein every Tuesday with hemodialysis. 04/09/14   JJones Bales MD  doxercalciferol (HECTOROL) 4 MCG/2ML injection Inject 1.5 mLs (3 mcg total) into the vein Every Tuesday,Thursday,and Saturday with dialysis. 04/07/14   JJones Bales MD  Nutritional Supplements (FEEDING SUPPLEMENT, NEPRO CARB STEADY,) LIQD Take 237 mLs by mouth 2 (two) times daily between meals. Patient not taking: Reported on 04/12/2015 04/07/14   JJones Bales MD    Current Facility-Administered Medications  Medication Dose Route Frequency Provider Last Rate Last Dose  . 0.9 %  sodium chloride infusion  10 mL/hr Intravenous Once DLeo Grosser MD      . 0.9 %  sodium chloride infusion   Intravenous Once ASamella Parr NP      . [Derrill MemoON 04/15/2015] Darbepoetin Alfa (ARANESP)  injection 200 mcg  200 mcg Intravenous Q Tue-HD ASamella Parr NP      . diphenhydrAMINE (BENADRYL) injection 12.5 mg  12.5 mg Intravenous Once ASamella Parr NP      . doxercalciferol (HECTOROL) injection 3 mcg  3 mcg Intravenous Q T,Th,Sa-HD ASamella Parr NP      . levETIRAcetam (KEPPRA) 500 mg in sodium chloride 0.9 % 100 mL IVPB  500 mg Intravenous Q12H ASamella Parr NP      . multivitamin (RENA-VIT) tablet 1 tablet  1 tablet Oral QHS ASamella Parr NP      . pantoprazole (  PROTONIX) 80 mg in sodium chloride 0.9 % 100 mL IVPB  80 mg Intravenous Once Kavitha Nandigam V, MD      . pantoprazole (PROTONIX) 80 mg in sodium chloride 0.9 % 250 mL (0.32 mg/mL) infusion  8 mg/hr Intravenous Continuous Kavitha Nandigam V, MD      . Derrill Memo ON 04/15/2015] pantoprazole (PROTONIX) injection 40 mg  40 mg Intravenous Q12H Kavitha Nandigam V, MD      . sodium chloride 0.9 % injection 3 mL  3 mL Intravenous Q12H Samella Parr, NP        Allergies as of 04/12/2015 - Review Complete 04/12/2015  Allergen Reaction Noted  . Reglan [metoclopramide] Other (See Comments) 03/02/2012     Review of Systems:    This is positive for those things mentioned in the HPI All other review of systems are negative.       Physical Exam:  Vital signs in last 24 hours: Temp:  [94.5 F (34.7 C)-98 F (36.7 C)] 98 F (36.7 C) (12/24 0920) Pulse Rate:  [67-70] 70 (12/24 0940) Resp:  [13-18] 13 (12/24 0830) BP: (129-143)/(50-66) 138/66 mmHg (12/24 0920) SpO2:  [100 %] 100 % (12/24 0940) Weight:  [118 lb 13.3 oz (53.9 kg)-135 lb (61.236 kg)] 118 lb 13.3 oz (53.9 kg) (12/24 0920)    General:  Cachetic and in no acute distress Eyes:  anicteric. ENT:   Mouth and posterior pharynx free of lesions.  Neck:   supple w/o thyromegaly or mass.  Lungs: Clear to auscultation bilaterally. Heart:  S1S2, no rubs, murmurs, gallops. Abdomen:  soft, non-tender, no hepatosplenomegaly, hernia, or mass and BS+.  Lymph:  no  cervical or supraclavicular adenopathy. Extremities:   no edema, L arm AV fistula Skin   no rash. Neuro:  A&O x 3.  Psych:  appropriate mood and  Affect.   Data Reviewed:   LAB RESULTS:  Recent Labs  04/12/15 0657  WBC 3.9*  HGB 3.5*  HCT 10.9*  PLT 155   BMET  Recent Labs  04/12/15 0657  NA 143  K 5.0  CL 105  CO2 22  GLUCOSE 92  BUN 109*  CREATININE 8.60*  CALCIUM 7.2*   LFT  Recent Labs  04/12/15 0657  PROT 5.0*  ALBUMIN 2.1*  AST 14*  ALT 9*  ALKPHOS 35*  BILITOT 0.5  BILIDIR <0.1*  IBILI NOT CALCULATED   PT/INR  Recent Labs  04/12/15 0657  LABPROT 16.0*  INR 1.26    STUDIES: Dg Chest Port 1 View  04/12/2015  CLINICAL DATA:  Hypothermia, chronic diastolic congestive heart failure. EXAM: PORTABLE CHEST 1 VIEW COMPARISON:  April 22, 2014 FINDINGS: The heart size and mediastinal contours are stable. Both lungs are clear. The visualized skeletal structures are stable. IMPRESSION: No active cardiopulmonary disease. Electronically Signed   By: Abelardo Diesel M.D.   On: 04/12/2015 08:34     PREVIOUS ENDOSCOPIES:            As per HPI    Impression / Plan:  59 year old male with history of end-stage renal disease, cocaine abuse and chronic anemia which is likely multifactorial admitted with dark heme positive stool and subacute drop in hemoglobin He appears to have pancytopenia with drop in all 3 cell lines, ?hemodilution. Recheck CBC andTransfuse to Hgb>7 DDAVP for uremic bleeding PPI drip ?ongoing cocaine use: Urine tox screen we'll continue to monitor and tentatively plan for EGD and colonoscopy; timing based on anesthesia availability  Damaris Hippo , MD [phone] Mon-Fri 8a-5p [phone] after 5p, weekends, holidays  @  04/12/2015, 9:56 AM

## 2015-04-12 NOTE — ED Provider Notes (Signed)
CSN: [id number]     Arrival date & time 04/12/15  [id number] History   First MD Initiated Contact with Patient 04/12/15 [phone]     Chief Complaint  Patient presents with  . low hemoglobin     . Abnormal Lab  . Anemia     (Consider location/radiation/quality/duration/timing/severity/associated sxs/prior Treatment) Patient is a 59 y.o. male presenting with anemia. The history is provided by the patient.  Anemia This is a recurrent problem. The current episode started 2 days ago. The problem occurs constantly. The problem has not changed since onset.Pertinent negatives include no chest pain, no abdominal pain and no shortness of breath. Associated symptoms comments: Cough, BRBPR, weakness. The symptoms are aggravated by walking and exertion. Nothing relieves the symptoms. He has tried nothing for the symptoms.    Past Medical History  Diagnosis Date  . Hypertension   . Anemia, chronic disease     a. Capsule endoscopy reportedly negative 08/2013. b. seen by heme with concern for minor B cell population on BMB, being observed.  . Chronic diastolic CHF (congestive heart failure) ([place])     a. EF 60 - 65% per [place] echo 11/2011. b. Echo 02/2012: severe LVH, focal basal hypertrophy, EF 60-65%, mild Mr.  . Cocaine abuse     mentioned in notes from [place]  . Hepatitis C antibody test positive     was HIV negative, 02/28/12  . Hepatitis B core antibody positive     03/01/10  . Positive QuantiFERON-TB Gold test     11/2011  . Helicobacter pylori gastritis     not defined if this was treated  . Polyp of colon, adenomatous     May 2012.  Dr Trenton Founds in [place]  . Hematochezia   . Head injury, closed, with concussion ([place])   . History of blood transfusion   . Arthritis     "right shoulder" (11/09/2012)  . Seizure disorder [facility])     questionable history of - will need to clarify with PCP  . Headache(784.0)   . ESRD on hemodialysis ([place]) since 2012    a. Since 2012. ESRD was due to "drugs",  primarily used cocaine.  Has 3-5 year hx of HTN, no DM.  Gets HD on TTS schedule at [place]. Originally from [place].   . Hypercalcemia   . Hyperpotassemia   . Tobacco abuse   . Protein-calorie malnutrition, severe ([place])   . Optic neuritis, left   . Hypertensive heart disease   . GI bleed 05/04/2012   Past Surgical History  Procedure Laterality Date  . Shoulder open rotator cuff repair Right   . Total knee arthroplasty Left   . Bascilic vein transposition  03/07/2012    Procedure: BASCILIC VEIN TRANSPOSITION;  Surgeon: Conrad Douglass, MD;  Location: [facility];  Service: Vascular;  Laterality: Left;  First Stage  . Insertion of dialysis catheter      right chest  . Bascilic vein transposition Left 05/31/2012    Procedure: BASCILIC VEIN TRANSPOSITION;  Surgeon: Conrad Minco, MD;  Location: [facility];  Service: Vascular;  Laterality: Left;  Left 2nd Stage Basilic Vein Transposition with gortex graft revision using 42mmx10cm graft  . Shoulder open rotator cuff repair Right   . [name] capsule study N/A 08/27/2013    Procedure: [name] CAPSULE STUDY;  Surgeon: Milus Banister, MD;  Location: [facility];  Service: Endoscopy;  Laterality: N/A;   Family History  Problem Relation Age of Onset  . Diabetes Mother   .  Hypertension Mother   . Stroke Mother   . Kidney failure Mother   . Cancer Father    Social History  Substance Use Topics  . Smoking status: Current Every Day Smoker -- 1.00 packs/day for 25 years    Types: Cigarettes  . Smokeless tobacco: Never Used  . Alcohol Use: No     Comment: 11/09/2012 "been stopped drinking 1-2 yr ago"    Review of Systems  Respiratory: Negative for shortness of breath.   Cardiovascular: Negative for chest pain.  Gastrointestinal: Negative for abdominal pain.  All other systems reviewed and are negative.     Allergies  Reglan  Home Medications   Prior to Admission medications   Medication Sig Start Date End Date Taking? Authorizing Provider   calcium acetate (PHOSLO) 667 MG capsule Take 2,001 mg by mouth 3 (three) times daily with meals.     Historical Provider, MD  Darbepoetin Alfa (ARANESP) 200 MCG/0.4ML SOSY injection Inject 0.4 mLs (200 mcg total) into the vein every Tuesday with hemodialysis. 04/09/14   Jones Bales, MD  doxercalciferol (HECTOROL) 4 MCG/2ML injection Inject 1.5 mLs (3 mcg total) into the vein Every Tuesday,Thursday,and Saturday with dialysis. 04/07/14   Jones Bales, MD  guaiFENesin-codeine 100-10 MG/5ML syrup Take 5 mLs by mouth every 6 (six) hours as needed for cough. 04/25/14   Luan Moore, MD  hydrALAZINE (APRESOLINE) 100 MG tablet Take 100 mg by mouth 2 (two) times daily. DON'T TAKE ON DIALYSIS DAYS    Historical Provider, MD  levETIRAcetam (KEPPRA) 1000 MG tablet Take 1 tablet (1,000 mg total) by mouth daily. 04/25/14   Luan Moore, MD  levETIRAcetam (KEPPRA) 500 MG tablet Take 1 tablet (500 mg total) by mouth Every Tuesday,Thursday,and Saturday with dialysis. 04/25/14   Luan Moore, MD  multivitamin (RENA-VIT) TABS tablet Take 1 tablet by mouth at bedtime. 04/25/14   Luan Moore, MD  Nutritional Supplements (FEEDING SUPPLEMENT, NEPRO CARB STEADY,) LIQD Take 237 mLs by mouth 2 (two) times daily between meals. 04/07/14   Jones Bales, MD   BP 129/50 mmHg  Pulse 69  Temp(Src) 94.5 F (34.7 C) (Rectal)  Resp 13  Ht 5\' 9"  (1.753 m)  Wt 135 lb (61.236 kg)  BMI 19.93 kg/m2  SpO2 100% Physical Exam  Constitutional: [name] oriented to person, place, and time. He appears well-developed and well-nourished. He appears listless. He has a sickly appearance.  HENT:  Head: Normocephalic and atraumatic.  Eyes:  Conjunctival pallor  Neck: Neck supple. No tracheal deviation present.  Cardiovascular: Normal rate, regular rhythm and normal heart sounds.   Pulmonary/Chest: Effort normal and breath sounds normal. No respiratory distress.  Abdominal: Soft. He exhibits no distension. There is no tenderness.   Genitourinary: Guaiac positive stool.  Neurological: [name] oriented to person, place, and time. He appears listless.  Skin: Skin is warm and dry. There is pallor.  Psychiatric: He has a normal mood and affect.    ED Course  Procedures (including critical care time)  CRITICAL CARE Performed by: Leo Grosser Total critical care time: 30 minutes Critical care time was exclusive of separately billable procedures and treating other patients. Critical care was necessary to treat or prevent imminent or life-threatening deterioration. Critical care was time spent personally by me on the following activities: development of treatment plan with patient and/or surrogate as well as nursing, discussions with consultants, evaluation of patient's response to treatment, examination of patient, obtaining history from patient or surrogate, ordering and performing treatments  and interventions, ordering and review of laboratory studies, ordering and review of radiographic studies, pulse oximetry and re-evaluation of patient's condition.   Labs Review Labs Reviewed  CBC WITH DIFFERENTIAL/PLATELET - Abnormal; Notable for the following:    WBC 3.9 (*)    RBC 1.20 (*)    Hemoglobin 3.5 (*)    HCT 10.9 (*)    RDW 23.0 (*)    All other components within normal limits  BASIC METABOLIC PANEL - Abnormal; Notable for the following:    BUN 109 (*)    Creatinine, Ser 8.60 (*)    Calcium 7.2 (*)    GFR calc non Af Amer 6 (*)    GFR calc Af Amer 7 (*)    Anion gap 16 (*)    All other components within normal limits  RETICULOCYTES - Abnormal; Notable for the following:    Retic Ct Pct 8.3 (*)    RBC. 1.20 (*)    All other components within normal limits  HEPATIC FUNCTION PANEL - Abnormal; Notable for the following:    Total Protein 5.0 (*)    Albumin 2.1 (*)    AST 14 (*)    ALT 9 (*)    Alkaline Phosphatase 35 (*)    Bilirubin, Direct <0.1 (*)    All other components within normal limits  POC OCCULT  BLOOD, ED - Abnormal; Notable for the following:    Fecal Occult Bld POSITIVE (*)    All other components within normal limits  VITAMIN B12  IRON AND TIBC  FERRITIN  FOLATE  TROPONIN I  APTT  PROTIME-INR  POC OCCULT BLOOD, ED  TYPE AND SCREEN  PREPARE RBC (CROSSMATCH)    Imaging Review No results found. I have personally reviewed and evaluated these images and lab results as part of my medical decision-making.   EKG Interpretation   Date/Time:  Saturday April 12 2015 07:11:37 EST Ventricular Rate:  68 PR Interval:  184 QRS Duration: 118 QT Interval:  550 QTC Calculation: 585 R Axis:   60 Text Interpretation:  Sinus rhythm Probable left atrial enlargement Left  ventricular hypertrophy Nonspecific T abnormalities, lateral leads  Prolonged QT interval Confirmed by Issa Luster MD, Quillian Quince [id number]) on 04/12/2015  7:30:25 AM      MDM   Final diagnoses:  Gastrointestinal hemorrhage, unspecified gastritis, unspecified gastrointestinal hemorrhage type  Symptomatic anemia  Hypothermia, initial encounter  Acute blood loss anemia  Hypothermia    59 y.o. male presents with weakness from dialysis center with notable hemoglobin of 6 drawn 2 days ago. He states that he has had some mild cough over the last 2 days and has noticed bright red blood per rectum and small quantities since yesterday. He has a history of previous chronic anemia secondary to end-stage renal and recurrent GI bleed without an identified source. He has been previously scoped and had pelvic endoscopy performed by [facility]. His hemoglobin here today is 3.5. Screening labs sent for signs of end organ damage secondary to traumatic drop in hemoglobin. 2 units of packed red blood cells were ordered for transfusion and 2 place on standby. Hospitalist was consulted for admission and will see the patient in the emergency department. Discussed case with [facility] who will see the patient during  admission.  Patient was initially hypothermic and initiated external warming. This is likely secondary to acute blood loss but infectious etiologies were considered. No indication for empiric in about X. Given Protonix for GI prophylaxis.    Quillian Quince  Laneta Simmers, MD 04/12/15 [phone]

## 2015-04-13 LAB — TYPE AND SCREEN
ABO/RH(D): A POS
Antibody Screen: NEGATIVE
UNIT DIVISION: 0
Unit division: 0
Unit division: 0
Unit division: 0
Unit division: 0

## 2015-04-13 LAB — LEVETIRACETAM LEVEL: LEVETIRACETAM: 44.5 ug/mL — AB (ref 10.0–40.0)

## 2015-04-14 SURGERY — ESOPHAGOGASTRODUODENOSCOPY (EGD) WITH PROPOFOL
Anesthesia: Monitor Anesthesia Care

## 2015-04-16 LAB — H PYLORI, IGM, IGG, IGA AB
H PYLORI IGG: 1.2 U/mL — AB (ref 0.0–0.8)
H. Pylogi, Iga Abs: <9 units (ref 0.0–8.9)

## 2015-06-19 ENCOUNTER — Encounter (HOSPITAL_COMMUNITY): Payer: Self-pay | Admitting: *Deleted

## 2015-06-19 ENCOUNTER — Inpatient Hospital Stay (HOSPITAL_COMMUNITY)
Admission: EM | Admit: 2015-06-19 | Discharge: 2015-06-20 | DRG: 896 | Disposition: A | Discharge status: Home / Self Care | Payer: Medicare Other | Attending: Internal Medicine | Admitting: Internal Medicine

## 2015-06-19 ENCOUNTER — Emergency Department (HOSPITAL_COMMUNITY): Payer: Medicare Other

## 2015-06-19 DIAGNOSIS — F1410 Cocaine abuse, uncomplicated: Secondary | ICD-10-CM | POA: Diagnosis present

## 2015-06-19 DIAGNOSIS — I5032 Chronic diastolic (congestive) heart failure: Secondary | ICD-10-CM | POA: Diagnosis present

## 2015-06-19 DIAGNOSIS — Z8619 Personal history of other infectious and parasitic diseases: Secondary | ICD-10-CM | POA: Diagnosis present

## 2015-06-19 DIAGNOSIS — G40909 Epilepsy, unspecified, not intractable, without status epilepticus: Secondary | ICD-10-CM

## 2015-06-19 DIAGNOSIS — N19 Unspecified kidney failure: Secondary | ICD-10-CM | POA: Insufficient documentation

## 2015-06-19 DIAGNOSIS — F10239 Alcohol dependence with withdrawal, unspecified: Principal | ICD-10-CM | POA: Diagnosis present

## 2015-06-19 DIAGNOSIS — R9431 Abnormal electrocardiogram [ECG] [EKG]: Secondary | ICD-10-CM | POA: Diagnosis present

## 2015-06-19 DIAGNOSIS — N2581 Secondary hyperparathyroidism of renal origin: Secondary | ICD-10-CM | POA: Diagnosis present

## 2015-06-19 DIAGNOSIS — Z992 Dependence on renal dialysis: Secondary | ICD-10-CM

## 2015-06-19 DIAGNOSIS — F1290 Cannabis use, unspecified, uncomplicated: Secondary | ICD-10-CM | POA: Diagnosis present

## 2015-06-19 DIAGNOSIS — K922 Gastrointestinal hemorrhage, unspecified: Secondary | ICD-10-CM | POA: Diagnosis present

## 2015-06-19 DIAGNOSIS — I132 Hypertensive heart and chronic kidney disease with heart failure and with stage 5 chronic kidney disease, or end stage renal disease: Secondary | ICD-10-CM | POA: Diagnosis present

## 2015-06-19 DIAGNOSIS — N189 Chronic kidney disease, unspecified: Secondary | ICD-10-CM

## 2015-06-19 DIAGNOSIS — F10939 Alcohol use, unspecified with withdrawal, unspecified: Secondary | ICD-10-CM | POA: Diagnosis present

## 2015-06-19 DIAGNOSIS — E875 Hyperkalemia: Secondary | ICD-10-CM | POA: Diagnosis not present

## 2015-06-19 DIAGNOSIS — Z720 Tobacco use: Secondary | ICD-10-CM | POA: Diagnosis present

## 2015-06-19 DIAGNOSIS — F17210 Nicotine dependence, cigarettes, uncomplicated: Secondary | ICD-10-CM | POA: Diagnosis present

## 2015-06-19 DIAGNOSIS — Z9115 Patient's noncompliance with renal dialysis: Secondary | ICD-10-CM

## 2015-06-19 DIAGNOSIS — Z9119 Patient's noncompliance with other medical treatment and regimen: Secondary | ICD-10-CM

## 2015-06-19 DIAGNOSIS — I10 Essential (primary) hypertension: Secondary | ICD-10-CM | POA: Diagnosis present

## 2015-06-19 DIAGNOSIS — F10930 Alcohol use, unspecified with withdrawal, uncomplicated: Secondary | ICD-10-CM

## 2015-06-19 DIAGNOSIS — D631 Anemia in chronic kidney disease: Secondary | ICD-10-CM | POA: Diagnosis present

## 2015-06-19 DIAGNOSIS — G9340 Encephalopathy, unspecified: Secondary | ICD-10-CM | POA: Diagnosis present

## 2015-06-19 DIAGNOSIS — N186 End stage renal disease: Secondary | ICD-10-CM

## 2015-06-19 DIAGNOSIS — F10230 Alcohol dependence with withdrawal, uncomplicated: Secondary | ICD-10-CM

## 2015-06-19 LAB — CBC WITH DIFFERENTIAL/PLATELET
Basophils Absolute: 0 10*3/uL (ref 0.0–0.1)
Basophils Relative: 1 %
EOS PCT: 4 %
Eosinophils Absolute: 0.2 10*3/uL (ref 0.0–0.7)
HCT: 32.4 % — ABNORMAL LOW (ref 39.0–52.0)
Hemoglobin: 10.1 g/dL — ABNORMAL LOW (ref 13.0–17.0)
LYMPHS ABS: 1.2 10*3/uL (ref 0.7–4.0)
LYMPHS PCT: 21 %
MCH: 27.2 pg (ref 26.0–34.0)
MCHC: 31.2 g/dL (ref 30.0–36.0)
MCV: 87.3 fL (ref 78.0–100.0)
MONO ABS: 0.3 10*3/uL (ref 0.1–1.0)
MONOS PCT: 6 %
NEUTROS ABS: 3.7 10*3/uL (ref 1.7–7.7)
Neutrophils Relative %: 68 %
PLATELETS: 249 10*3/uL (ref 150–400)
RBC: 3.71 MIL/uL — ABNORMAL LOW (ref 4.22–5.81)
RDW: 18.4 % — ABNORMAL HIGH (ref 11.5–15.5)
WBC: 5.4 10*3/uL (ref 4.0–10.5)

## 2015-06-19 LAB — RENAL FUNCTION PANEL
ALBUMIN: 2.6 g/dL — AB (ref 3.5–5.0)
ANION GAP: 24 — AB (ref 5–15)
Albumin: 2.7 g/dL — ABNORMAL LOW (ref 3.5–5.0)
Anion gap: 16 — ABNORMAL HIGH (ref 5–15)
BUN: 147 mg/dL — AB (ref 6–20)
BUN: 47 mg/dL — ABNORMAL HIGH (ref 6–20)
CALCIUM: 6.1 mg/dL — AB (ref 8.9–10.3)
CO2: 17 mmol/L — ABNORMAL LOW (ref 22–32)
CO2: 23 mmol/L (ref 22–32)
CREATININE: 15.35 mg/dL — AB (ref 0.61–1.24)
Calcium: 7.1 mg/dL — ABNORMAL LOW (ref 8.9–10.3)
Chloride: 107 mmol/L (ref 101–111)
Chloride: 101 mmol/L (ref 101–111)
Creatinine, Ser: 6.8 mg/dL — ABNORMAL HIGH (ref 0.61–1.24)
GFR calc Af Amer: 3 mL/min — ABNORMAL LOW (ref >60)
GFR calc Af Amer: 9 mL/min — ABNORMAL LOW (ref >60)
GFR calc non Af Amer: 3 mL/min — ABNORMAL LOW (ref >60)
GFR calc non Af Amer: 8 mL/min — ABNORMAL LOW (ref >60)
GLUCOSE: 128 mg/dL — AB (ref 65–99)
Glucose, Bld: 65 mg/dL (ref 65–99)
PHOSPHORUS: 14.1 mg/dL — AB (ref 2.5–4.6)
Phosphorus: 5 mg/dL — ABNORMAL HIGH (ref 2.5–4.6)
Potassium: 6.3 mmol/L (ref 3.5–5.1)
Potassium: 4 mmol/L (ref 3.5–5.1)
SODIUM: 148 mmol/L — AB (ref 135–145)
Sodium: 140 mmol/L (ref 135–145)

## 2015-06-19 LAB — I-STAT CHEM 8, ED
BUN: 133 mg/dL — AB (ref 6–20)
CALCIUM ION: 0.72 mmol/L — AB (ref 1.12–1.23)
Chloride: 109 mmol/L (ref 101–111)
Creatinine, Ser: 14.4 mg/dL — ABNORMAL HIGH (ref 0.61–1.24)
Glucose, Bld: 132 mg/dL — ABNORMAL HIGH (ref 65–99)
HEMATOCRIT: 36 % — AB (ref 39.0–52.0)
HEMOGLOBIN: 12.2 g/dL — AB (ref 13.0–17.0)
Potassium: 6.3 mmol/L (ref 3.5–5.1)
SODIUM: 147 mmol/L — AB (ref 135–145)
TCO2: 21 mmol/L (ref 0–100)

## 2015-06-19 LAB — COMPREHENSIVE METABOLIC PANEL
ALK PHOS: 87 U/L (ref 38–126)
ALT: 28 U/L (ref 17–63)
ANION GAP: 22 — AB (ref 5–15)
AST: 27 U/L (ref 15–41)
Albumin: 2.8 g/dL — ABNORMAL LOW (ref 3.5–5.0)
BUN: 144 mg/dL — ABNORMAL HIGH (ref 6–20)
CALCIUM: 6.3 mg/dL — AB (ref 8.9–10.3)
CO2: 19 mmol/L — AB (ref 22–32)
CREATININE: 15.16 mg/dL — AB (ref 0.61–1.24)
Chloride: 107 mmol/L (ref 101–111)
GFR, EST AFRICAN AMERICAN: 4 mL/min — AB (ref >60)
GFR, EST NON AFRICAN AMERICAN: 3 mL/min — AB (ref >60)
Glucose, Bld: 146 mg/dL — ABNORMAL HIGH (ref 65–99)
Potassium: 6.8 mmol/L (ref 3.5–5.1)
SODIUM: 148 mmol/L — AB (ref 135–145)
TOTAL PROTEIN: 7.2 g/dL (ref 6.5–8.1)
Total Bilirubin: 0.5 mg/dL (ref 0.3–1.2)

## 2015-06-19 LAB — CBC
HCT: 30.1 % — ABNORMAL LOW (ref 39.0–52.0)
Hemoglobin: 9.4 g/dL — ABNORMAL LOW (ref 13.0–17.0)
MCH: 26.9 pg (ref 26.0–34.0)
MCHC: 31.2 g/dL (ref 30.0–36.0)
MCV: 86 fL (ref 78.0–100.0)
Platelets: 239 10*3/uL (ref 150–400)
RBC: 3.5 MIL/uL — ABNORMAL LOW (ref 4.22–5.81)
RDW: 18.3 % — ABNORMAL HIGH (ref 11.5–15.5)
WBC: 4.9 10*3/uL (ref 4.0–10.5)

## 2015-06-19 LAB — ETHANOL

## 2015-06-19 LAB — MRSA PCR SCREENING: MRSA BY PCR: NEGATIVE

## 2015-06-19 LAB — I-STAT TROPONIN, ED: TROPONIN I, POC: 0.05 ng/mL (ref 0.00–0.08)

## 2015-06-19 LAB — TSH: TSH: 2.822 u[IU]/mL (ref 0.350–4.500)

## 2015-06-19 MED ORDER — SODIUM CHLORIDE 0.9 % IV SOLN: 100.0000 mL | INTRAVENOUS | Status: DC | PRN

## 2015-06-19 MED ORDER — HEPARIN SODIUM (PORCINE) 1000 UNIT/ML DIALYSIS: 1000.0000 [IU] | INTRAMUSCULAR | Status: DC | PRN

## 2015-06-19 MED ORDER — ALTEPLASE 2 MG IJ SOLR: 2.0000 mg | Freq: Once | INTRAMUSCULAR | Status: DC | PRN

## 2015-06-19 MED ORDER — INSULIN ASPART 100 UNIT/ML IV SOLN
5.0000 [IU] | Freq: Once | INTRAVENOUS | Status: AC
  Administered 2015-06-19: 5 [IU] via INTRAVENOUS
  Filled 2015-06-19: qty 1

## 2015-06-19 MED ORDER — HEPARIN SODIUM (PORCINE) 5000 UNIT/ML IJ SOLN
5000.0000 [IU] | Freq: Three times a day (TID) | INTRAMUSCULAR | Status: DC
  Administered 2015-06-19 – 2015-06-20 (×4): 5000 [IU] via SUBCUTANEOUS
  Filled 2015-06-19 (×4): qty 1

## 2015-06-19 MED ORDER — DOXERCALCIFEROL 4 MCG/2ML IV SOLN
INTRAVENOUS | Status: AC
  Filled 2015-06-19: qty 4

## 2015-06-19 MED ORDER — HYDRALAZINE HCL 100 MG PO TABS: 100.0000 mg | ORAL_TABLET | Freq: Two times a day (BID) | ORAL | Status: DC

## 2015-06-19 MED ORDER — LIDOCAINE-PRILOCAINE 2.5-2.5 % EX CREA: 1.0000 "application " | TOPICAL_CREAM | CUTANEOUS | Status: DC | PRN

## 2015-06-19 MED ORDER — LEVETIRACETAM 500 MG PO TABS: 500.0000 mg | ORAL_TABLET | ORAL | Status: DC

## 2015-06-19 MED ORDER — PROMETHAZINE HCL 25 MG/ML IJ SOLN: 12.5000 mg | Freq: Four times a day (QID) | INTRAMUSCULAR | Status: DC | PRN

## 2015-06-19 MED ORDER — LOSARTAN POTASSIUM 50 MG PO TABS: 100.0000 mg | ORAL_TABLET | Freq: Every evening | ORAL | Status: DC

## 2015-06-19 MED ORDER — DOXERCALCIFEROL 4 MCG/2ML IV SOLN
6.0000 ug | INTRAVENOUS | Status: DC
  Administered 2015-06-19: 6 ug via INTRAVENOUS

## 2015-06-19 MED ORDER — LORAZEPAM 1 MG PO TABS: 0.0000 mg | ORAL_TABLET | Freq: Four times a day (QID) | ORAL | Status: DC

## 2015-06-19 MED ORDER — CLONIDINE HCL 0.2 MG PO TABS
0.2000 mg | ORAL_TABLET | Freq: Once | ORAL | Status: DC
  Filled 2015-06-19: qty 1

## 2015-06-19 MED ORDER — CALCIUM ACETATE (PHOS BINDER) 667 MG PO CAPS
2001.0000 mg | ORAL_CAPSULE | Freq: Three times a day (TID) | ORAL | Status: DC
  Administered 2015-06-19 – 2015-06-20 (×4): 2001 mg via ORAL
  Filled 2015-06-19 (×4): qty 3

## 2015-06-19 MED ORDER — HYDRALAZINE HCL 25 MG PO TABS: 25.0000 mg | ORAL_TABLET | Freq: Once | ORAL | Status: DC

## 2015-06-19 MED ORDER — LOSARTAN POTASSIUM 50 MG PO TABS
100.0000 mg | ORAL_TABLET | Freq: Every evening | ORAL | Status: DC
  Administered 2015-06-19 – 2015-06-20 (×2): 100 mg via ORAL
  Filled 2015-06-19 (×2): qty 2

## 2015-06-19 MED ORDER — HYDRALAZINE HCL 20 MG/ML IJ SOLN: 10.0000 mg | Freq: Once | INTRAMUSCULAR | Status: DC

## 2015-06-19 MED ORDER — LORAZEPAM 2 MG/ML IJ SOLN: 0.0000 mg | Freq: Two times a day (BID) | INTRAMUSCULAR | Status: DC

## 2015-06-19 MED ORDER — HYDRALAZINE HCL 25 MG PO TABS
100.0000 mg | ORAL_TABLET | Freq: Two times a day (BID) | ORAL | Status: DC
  Administered 2015-06-19: 100 mg via ORAL
  Filled 2015-06-19 (×3): qty 4

## 2015-06-19 MED ORDER — DARBEPOETIN ALFA 200 MCG/0.4ML IJ SOSY: 200.0000 ug | PREFILLED_SYRINGE | INTRAMUSCULAR | Status: DC

## 2015-06-19 MED ORDER — THIAMINE HCL 100 MG/ML IJ SOLN
100.0000 mg | Freq: Every day | INTRAMUSCULAR | Status: DC
  Administered 2015-06-19: 100 mg via INTRAVENOUS
  Filled 2015-06-19 (×2): qty 2

## 2015-06-19 MED ORDER — LIDOCAINE HCL (PF) 1 % IJ SOLN: 5.0000 mL | INTRAMUSCULAR | Status: DC | PRN

## 2015-06-19 MED ORDER — LORAZEPAM 2 MG/ML IJ SOLN
1.0000 mg | Freq: Once | INTRAMUSCULAR | Status: AC
Start: 2015-06-19 — End: 2015-06-19
  Administered 2015-06-19: 1 mg via INTRAVENOUS
  Filled 2015-06-19: qty 1

## 2015-06-19 MED ORDER — ACETAMINOPHEN 650 MG RE SUPP: 650.0000 mg | Freq: Four times a day (QID) | RECTAL | Status: DC | PRN

## 2015-06-19 MED ORDER — LEVETIRACETAM 500 MG PO TABS
1000.0000 mg | ORAL_TABLET | Freq: Every day | ORAL | Status: DC
  Administered 2015-06-19 – 2015-06-20 (×2): 1000 mg via ORAL
  Filled 2015-06-19 (×2): qty 2

## 2015-06-19 MED ORDER — DEXTROSE 50 % IV SOLN
50.0000 mL | Freq: Once | INTRAVENOUS | Status: AC
  Administered 2015-06-19: 50 mL via INTRAVENOUS
  Filled 2015-06-19: qty 50

## 2015-06-19 MED ORDER — SODIUM CHLORIDE 0.9 % IV SOLN
1.0000 g | Freq: Once | INTRAVENOUS | Status: DC
  Filled 2015-06-19: qty 10

## 2015-06-19 MED ORDER — ACETAMINOPHEN 325 MG PO TABS: 650.0000 mg | ORAL_TABLET | Freq: Four times a day (QID) | ORAL | Status: DC | PRN

## 2015-06-19 MED ORDER — SODIUM CHLORIDE 0.9 % IV SOLN: 62.5000 mg | INTRAVENOUS | Status: DC

## 2015-06-19 MED ORDER — VITAMIN B-1 100 MG PO TABS: 100.0000 mg | ORAL_TABLET | Freq: Every day | ORAL | Status: DC

## 2015-06-19 MED ORDER — RENA-VITE PO TABS
1.0000 | ORAL_TABLET | Freq: Every day | ORAL | Status: DC
  Administered 2015-06-19: 1 via ORAL
  Filled 2015-06-19: qty 1

## 2015-06-19 MED ORDER — PENTAFLUOROPROP-TETRAFLUOROETH EX AERO: 1.0000 "application " | INHALATION_SPRAY | CUTANEOUS | Status: DC | PRN

## 2015-06-19 MED ORDER — LORAZEPAM 2 MG/ML IJ SOLN
0.0000 mg | Freq: Four times a day (QID) | INTRAMUSCULAR | Status: DC
  Administered 2015-06-19 – 2015-06-20 (×2): 2 mg via INTRAVENOUS
  Filled 2015-06-19 (×2): qty 1

## 2015-06-19 MED ORDER — VITAMIN B-1 100 MG PO TABS
100.0000 mg | ORAL_TABLET | Freq: Every day | ORAL | Status: DC
  Administered 2015-06-20: 100 mg via ORAL
  Filled 2015-06-19: qty 1

## 2015-06-19 MED ORDER — SODIUM BICARBONATE 8.4 % IV SOLN
50.0000 meq | Freq: Once | INTRAVENOUS | Status: AC
  Administered 2015-06-19: 50 meq via INTRAVENOUS
  Filled 2015-06-19: qty 50

## 2015-06-19 MED ORDER — LORAZEPAM 1 MG PO TABS: 0.0000 mg | ORAL_TABLET | Freq: Two times a day (BID) | ORAL | Status: DC

## 2015-06-19 NOTE — Progress Notes (Signed)
Patient arrived on unit from hemodialysis.  No family at bedside.  Telemetry placed per MD order and CMT notified. Skin assessed and charted in EPIC.

## 2015-06-19 NOTE — Progress Notes (Signed)
06/19/2015 3:18 PM  Nurse received report from [facility] about a last BP taken of 223/97. Patient has a bed on [facility] and not been officially admitted. Informed Rapid Response. Informed attending MD Posey Pronto. MD stated he was unaware that the patient had been running that high of blood pressures during his hemo treatment. Stated he would take a look and let us know if patient was still appropriate for med-surg floor.   [employer], RN-BC, [facility] Phone [phone]

## 2015-06-19 NOTE — ED Notes (Signed)
CA Gluconate not up from pharmacy.

## 2015-06-19 NOTE — Progress Notes (Signed)
06/19/2015 9:34 AM  Report received. Patient will go to [facility] first then transfer to [facility]E28. Room is ready for patient.  [employer], RN-BC, [facility] Phone [phone]

## 2015-06-19 NOTE — Consult Note (Signed)
[facility] Renal Consultation Note    Indication for Consultation:  Management of ESRD/hemodialysis; anemia, hypertension/volume and secondary hyperparathyroidism PCP: None  HPI: [name] is a 60 y.o. male with ESRD on TTS dialysis at [place] who presented there today having missed HD since 06/12/15 .  He was found to be unable to walk, trembling and with slurred speech. He admitted to using crack/etoh x several days .  The staff didn't feel he was stable enough for outpt HD and subsequently called EMS.  He was transferred to [facility] ED where evaluation showed K 6.8 BUN 144 Cr 15 CO2 18. Na 148.  Hgb 10.1 WBC 5.4  CXR showed NAD.  He was transferred emergently to the inpatient HD unit before his hyperkalemia could be treated chemically.  Pre HD wt was 64 at outpt unit. He was evaluated in the ED and given IV ativan and is currently sleeping but somewhat rousable.  Past Medical History  Diagnosis Date  . Hypertension   . Anemia, chronic disease     a. Capsule endoscopy reportedly negative 08/2013. b. seen by heme with concern for minor B cell population on BMB, being observed.  . Chronic diastolic CHF (congestive heart failure) ([place])     a. EF 60 - 65% per [place] echo 11/2011. b. Echo 02/2012: severe LVH, focal basal hypertrophy, EF 60-65%, mild Mr.  . Cocaine abuse     mentioned in notes from [place]  . Hepatitis C antibody test positive     was HIV negative, 02/28/12  . Hepatitis B core antibody positive     03/01/10  . Positive QuantiFERON-TB Gold test     11/2011  . Helicobacter pylori gastritis     not defined if this was treated  . Polyp of colon, adenomatous     May 2012.  Dr Trenton Founds in [place]  . Hematochezia   . Head injury, closed, with concussion ([place])   . History of blood transfusion   . Arthritis     "right shoulder" (11/09/2012)  . Seizure disorder [facility])     questionable history of - will need to clarify with PCP  . Headache(784.0)   . ESRD on  hemodialysis ([place]) since 2012    a. Since 2012. ESRD was due to "drugs", primarily used cocaine.  Has 3-5 year hx of HTN, no DM.  Gets HD on TTS schedule at [place]. Originally from [place].   . Hypercalcemia   . Hyperpotassemia   . Tobacco abuse   . Protein-calorie malnutrition, severe ([place])   . Optic neuritis, left   . Hypertensive heart disease   . GI bleed 05/04/2012   Past Surgical History  Procedure Laterality Date  . Shoulder open rotator cuff repair Right   . Total knee arthroplasty Left   . Bascilic vein transposition  03/07/2012    Procedure: BASCILIC VEIN TRANSPOSITION;  Surgeon: Conrad Bigelow, MD;  Location: [place];  Service: Vascular;  Laterality: Left;  First Stage  . Insertion of dialysis catheter      right chest  . Bascilic vein transposition Left 05/31/2012    Procedure: BASCILIC VEIN TRANSPOSITION;  Surgeon: Conrad Ambler, MD;  Location: [place];  Service: Vascular;  Laterality: Left;  Left 2nd Stage Basilic Vein Transposition with gortex graft revision using 23mmx10cm graft  . Shoulder open rotator cuff repair Right   . [name] capsule study N/A 08/27/2013    Procedure: [name] CAPSULE STUDY;  Surgeon: Milus Banister, MD;  [facility];  Service: Endoscopy;  Laterality: N/A;   Family History  Problem Relation Age of Onset  . Diabetes Mother   . Hypertension Mother   . Stroke Mother   . Kidney failure Mother   . Cancer Father    Social History:  reports that he has been smoking Cigarettes.  He has a 25 pack-year smoking history. He has never used smokeless tobacco. He reports that he uses illicit drugs ("Crack" cocaine, Cocaine, and Marijuana). He reports that he does not drink alcohol. Allergies  Allergen Reactions  . Reglan [Metoclopramide] Other (See Comments)    Tardive dyskinesia in 11/2011 in [place]   Prior to Admission medications   Medication Sig Start Date End Date Taking? Authorizing Provider  calcium acetate (PHOSLO) 667 MG capsule Take  2,001 mg by mouth 3 (three) times daily with meals.     Historical Provider, MD  Darbepoetin Alfa (ARANESP) 200 MCG/0.4ML SOSY injection Inject 0.4 mLs (200 mcg total) into the vein every Tuesday with hemodialysis. 04/09/14   Jones Bales, MD  doxercalciferol (HECTOROL) 4 MCG/2ML injection Inject 1.5 mLs (3 mcg total) into the vein Every Tuesday,Thursday,and Saturday with dialysis. 04/07/14   Jones Bales, MD  hydrALAZINE (APRESOLINE) 100 MG tablet Take 100 mg by mouth 2 (two) times daily. DON'T TAKE ON DIALYSIS DAYS    Historical Provider, MD  levETIRAcetam (KEPPRA) 1000 MG tablet Take 1 tablet (1,000 mg total) by mouth daily. 04/25/14   Luan Moore, MD  levETIRAcetam (KEPPRA) 500 MG tablet Take 1 tablet (500 mg total) by mouth Every Tuesday,Thursday,and Saturday with dialysis. 04/25/14   Luan Moore, MD  losartan (COZAAR) 100 MG tablet Take 100 mg by mouth every evening. 10/02/14   Historical Provider, MD  multivitamin (RENA-VIT) TABS tablet Take 1 tablet by mouth at bedtime. 04/25/14   Luan Moore, MD  Nutritional Supplements (FEEDING SUPPLEMENT, NEPRO CARB STEADY,) LIQD Take 237 mLs by mouth 2 (two) times daily between meals. Patient not taking: Reported on 04/12/2015 04/07/14   Jones Bales, MD   Current Facility-Administered Medications  Medication Dose Route Frequency Provider Last Rate Last Dose  . acetaminophen (TYLENOL) tablet 650 mg  650 mg Oral Q6H PRN Juluis Mire, MD       Or  . acetaminophen (TYLENOL) suppository 650 mg  650 mg Rectal Q6H PRN Juluis Mire, MD      . calcium acetate (PHOSLO) capsule 2,001 mg  2,001 mg Oral TID WC Marjan Rabbani, MD      . calcium gluconate 1 g in sodium chloride 0.9 % 100 mL IVPB  1 g Intravenous Once Wandra Arthurs, MD      . doxercalciferol (HECTOROL) injection 6 mcg  6 mcg Intravenous Q T,Th,Sa-HD Alric Seton, PA-C      . heparin injection 5,000 Units  5,000 Units Subcutaneous 3 times per day Juluis Mire, MD   5,000 Units at  06/19/15 0937  . [START ON 06/20/2015] hydrALAZINE (APRESOLINE) tablet 100 mg  100 mg Oral BID Marjan Rabbani, MD      . levETIRAcetam (KEPPRA) tablet 1,000 mg  1,000 mg Oral Daily Marjan Rabbani, MD      . levETIRAcetam (KEPPRA) tablet 500 mg  500 mg Oral Q T,Th,Sa-HD Marjan Rabbani, MD      . LORazepam (ATIVAN) injection 0-4 mg  0-4 mg Intravenous 4 times per day Wandra Arthurs, MD      . Derrill Memo ON 06/20/2015] losartan (COZAAR) tablet 100 mg  100 mg Oral  QPM Marjan Rabbani, MD      . multivitamin (RENA-VIT) tablet 1 tablet  1 tablet Oral QHS Marjan Rabbani, MD      . promethazine (PHENERGAN) injection 12.5 mg  12.5 mg Intravenous Q6H PRN Marjan Rabbani, MD      . thiamine (B-1) injection 100 mg  100 mg Intravenous Daily Wandra Arthurs, MD   100 mg at 06/19/15 [id number]  . [START ON 06/20/2015] thiamine (VITAMIN B-1) tablet 100 mg  100 mg Oral Daily Juluis Mire, MD       Current Outpatient Prescriptions  Medication Sig Dispense Refill  . calcium acetate (PHOSLO) 667 MG capsule Take 2,001 mg by mouth 3 (three) times daily with meals.     . Darbepoetin Alfa (ARANESP) 200 MCG/0.4ML SOSY injection Inject 0.4 mLs (200 mcg total) into the vein every Tuesday with hemodialysis. 1.68 mL   . doxercalciferol (HECTOROL) 4 MCG/2ML injection Inject 1.5 mLs (3 mcg total) into the vein Every Tuesday,Thursday,and Saturday with dialysis. 2 mL   . hydrALAZINE (APRESOLINE) 100 MG tablet Take 100 mg by mouth 2 (two) times daily. DON'T TAKE ON DIALYSIS DAYS    . levETIRAcetam (KEPPRA) 1000 MG tablet Take 1 tablet (1,000 mg total) by mouth daily. 30 tablet 0  . levETIRAcetam (KEPPRA) 500 MG tablet Take 1 tablet (500 mg total) by mouth Every Tuesday,Thursday,and Saturday with dialysis. 30 tablet 0  . losartan (COZAAR) 100 MG tablet Take 100 mg by mouth every evening.    . multivitamin (RENA-VIT) TABS tablet Take 1 tablet by mouth at bedtime. 30 each 0  . Nutritional Supplements (FEEDING SUPPLEMENT, NEPRO CARB STEADY,) LIQD Take 237  mLs by mouth 2 (two) times daily between meals. (Patient not taking: Reported on 04/12/2015)  0   Labs: Basic Metabolic Panel:  Recent Labs Lab 06/19/15 0753 06/19/15 0756  NA 148* 147*  K 6.8* 6.3*  CL 107 109  CO2 19*  --   GLUCOSE 146* 132*  BUN 144* 133*  CREATININE 15.16* 14.40*  CALCIUM 6.3*  --    Liver Function Tests:  Recent Labs Lab 06/19/15 0753  AST 27  ALT 28  ALKPHOS 87  BILITOT 0.5  PROT 7.2  ALBUMIN 2.8*  CBC:  Recent Labs Lab 06/19/15 0753 06/19/15 0756  WBC 5.4  --   NEUTROABS 3.7  --   HGB 10.1* 12.2*  HCT 32.4* 36.0*  MCV 87.3  --   PLT 249  --   Studies/Results: Dg Chest 2 View  06/19/2015  CLINICAL DATA:  End-stage renal disease on dialysis, missed dialysis for a week, shortness of breath, feeling bad, chronic cough, hypertension, chronic diastolic CHF, smoker EXAM: CHEST  2 VIEW COMPARISON:  04/12/2015 FINDINGS: Enlargement of cardiac silhouette with pulmonary vascular congestion. Atherosclerotic calcification aorta. No definite acute fifth pulmonary edema or consolidation. No pleural effusion or pneumothorax. Bones demineralized. IMPRESSION: Enlargement of cardiac silhouette with pulmonary venous hypertension. No definite acute pulmonary edema. Electronically Signed   By: Lavonia Dana M.D.   On: 06/19/2015 08:14    ROS: As per HPI; too drowsy now to give history  Physical Exam: Filed Vitals:   06/19/15 0755 06/19/15 0756 06/19/15 0900 06/19/15 0945  BP:  200/92 184/80 184/74  Pulse:  83 77 76  Temp:      TempSrc:      Resp:   19 17  Height: 5\' 8"  (1.727 m)     Weight: 63.504 kg (140 lb)     SpO2:   99%  96%     General: disheveled slender AAM snoring, poorly coop Head: Normocephalic, atraumatic, sclera non-icteric, rouses then falls back to sleep Fundi  Art narrowing, silver wiring Neck: Supple. JVD not elevated. PCL Lungs: coarse BS throughout. Decreased bs Heart: RRR Gr 2/6 M , LV lift, PMI 13 cm lat to MSL Abdomen: Soft,  non-tender Liver down 5 cm Lower extremities:without edema or ischemic changes, skin thickened/scaly, poor hygeine Neuro: sedated.  Dialysis Access: left upper AVF + bruit  Dialysis Orders:  TTS AF 4 hr EDW 57.5 - gets to 58 - 58 3K 2.23 Ca profile 2 left upper AVF 400/A1.5 no heparin hectorol 6 venofer 100 through 3/11 Micera 225 q 2 weeks - last 2/23 Recent labs: Hgb 10 26% sat ferriitn 1166 Dec iPTH 635 Ca 7/7 P 8.9  Assessment/Plan: 1.  Hyperkalemia K 6.8 and hyperkalemia with elevated BUN/CR  secondary to missed HD/etoh abuse- discussed with Dr. Joelyn Oms - will treat gently with 1K bath Qb 250 - 3 hours - avoid large shifts; serial HD treatments- next HD Friday am- usual orders will ^ vol off 2. Substance abuse/agitation- crack/etoh -per dialysis staff, pt reported he has been on a binge; agitated - given IV ativan; probably withdrawal sx- primary to manage 3. Dialysis noncompliance - ongoing issue - Dr. Mercy Moore has continued to discussed considering stopping dialysis, but pt has elected to continue with varying compliance 4.  ESRD -  TTS - serial HD as per #1, he has been on a 3 K bath - given his erratic attendance - he probably needs to be on a 2 K bath at d/c 5.  Hypertension/volume  - usual range - about 6-7 above edw (goal today more modest 2 L) - on hydralazine and losartan - BP always in this range at discharge 6.  Anemia  - continue ESA due next week - changed Fe to weekly - doesn't need short course (100 x 5) with ferritin of 1166- 7.  Metabolic bone disease -  Continue hectorol, binders- noncompliant with binders as outpt 8.  Nutrition - renal diet/vit 9. Seizure disorder - on keppra 10. Hep C + 11. Hx GIB - on no heparin HD  Myriam Jacobson, PA-C [facility] [phone]. 06/19/2015, 10:14 AM I have seen and examined this patient and agree with the plan of care  Seen,eval, examined. .  Arnetha Silverthorne L 06/19/2015, 2:00 PM

## 2015-06-19 NOTE — ED Provider Notes (Signed)
CSN: [id number]     Arrival date & time 06/19/15  0731 History   First MD Initiated Contact with Patient 06/19/15 [phone]     Chief Complaint  Patient presents with  . Shaking     (Consider location/radiation/quality/duration/timing/severity/associated sxs/prior Treatment) The history is provided by the patient.  [name] is a 60 y.o. male hx of HTN, CHF, ESRD on HD (last HD was a week ago), chronic alcoholic, here with tremors. Patient states that he skipped dialysis for a week because he "went out partying". He drinks alcohol every day, he states that he doesn't drink that much but drinks about a fifth a day. Last drink was 2 days ago. For the last 2 days he has been having diffuse tremors in his hands. Denies any seizure activity but he is on Keppra. Patient finally went to dialysis this morning and the dialysis center saw his tremors and was concerned that he may have a stroke. Denies any trouble speaking states that he does have some tremors. Denies actual seizure activity.     Past Medical History  Diagnosis Date  . Hypertension   . Anemia, chronic disease     a. Capsule endoscopy reportedly negative 08/2013. b. seen by heme with concern for minor B cell population on BMB, being observed.  . Chronic diastolic CHF (congestive heart failure) ([place])     a. EF 60 - 65% per [place] echo 11/2011. b. Echo 02/2012: severe LVH, focal basal hypertrophy, EF 60-65%, mild Mr.  . Cocaine abuse     mentioned in notes from [place]  . Hepatitis C antibody test positive     was HIV negative, 02/28/12  . Hepatitis B core antibody positive     03/01/10  . Positive QuantiFERON-TB Gold test     11/2011  . Helicobacter pylori gastritis     not defined if this was treated  . Polyp of colon, adenomatous     May 2012.  Dr Trenton Founds in [place]  . Hematochezia   . Head injury, closed, with concussion ([place])   . History of blood transfusion   . Arthritis     "right shoulder" (11/09/2012)  . Seizure  disorder [facility])     questionable history of - will need to clarify with PCP  . Headache(784.0)   . ESRD on hemodialysis ([place]) since 2012    a. Since 2012. ESRD was due to "drugs", primarily used cocaine.  Has 3-5 year hx of HTN, no DM.  Gets HD on TTS schedule at [place]. Originally from [place].   . Hypercalcemia   . Hyperpotassemia   . Tobacco abuse   . Protein-calorie malnutrition, severe ([place])   . Optic neuritis, left   . Hypertensive heart disease   . GI bleed 05/04/2012   Past Surgical History  Procedure Laterality Date  . Shoulder open rotator cuff repair Right   . Total knee arthroplasty Left   . Bascilic vein transposition  03/07/2012    Procedure: BASCILIC VEIN TRANSPOSITION;  Surgeon: Conrad [place], MD;  Location: [facility];  Service: Vascular;  Laterality: Left;  First Stage  . Insertion of dialysis catheter      right chest  . Bascilic vein transposition Left 05/31/2012    Procedure: BASCILIC VEIN TRANSPOSITION;  Surgeon: Conrad [place], MD;  Location: [facility];  Service: Vascular;  Laterality: Left;  Left 2nd Stage Basilic Vein Transposition with gortex graft revision using 29mmx10cm graft  . Shoulder open rotator cuff repair Right   .  [name] capsule study N/A 08/27/2013    Procedure: [name] CAPSULE STUDY;  Surgeon: Milus Banister, MD;  Location: [facility];  Service: Endoscopy;  Laterality: N/A;   Family History  Problem Relation Age of Onset  . Diabetes Mother   . Hypertension Mother   . Stroke Mother   . Kidney failure Mother   . Cancer Father    Social History  Substance Use Topics  . Smoking status: Current Every Day Smoker -- 1.00 packs/day for 25 years    Types: Cigarettes  . Smokeless tobacco: Never Used  . Alcohol Use: No     Comment: 2 40's per day    Review of Systems  Neurological: Positive for tremors.  All other systems reviewed and are negative.     Allergies  Reglan  Home Medications   Prior to Admission medications   Medication Sig  Start Date End Date Taking? Authorizing Provider  calcium acetate (PHOSLO) 667 MG capsule Take 2,001 mg by mouth 3 (three) times daily with meals.     Historical Provider, MD  Darbepoetin Alfa (ARANESP) 200 MCG/0.4ML SOSY injection Inject 0.4 mLs (200 mcg total) into the vein every Tuesday with hemodialysis. 04/09/14   Jones Bales, MD  doxercalciferol (HECTOROL) 4 MCG/2ML injection Inject 1.5 mLs (3 mcg total) into the vein Every Tuesday,Thursday,and Saturday with dialysis. 04/07/14   Jones Bales, MD  hydrALAZINE (APRESOLINE) 100 MG tablet Take 100 mg by mouth 2 (two) times daily. DON'T TAKE ON DIALYSIS DAYS    Historical Provider, MD  levETIRAcetam (KEPPRA) 1000 MG tablet Take 1 tablet (1,000 mg total) by mouth daily. 04/25/14   Luan Moore, MD  levETIRAcetam (KEPPRA) 500 MG tablet Take 1 tablet (500 mg total) by mouth Every Tuesday,Thursday,and Saturday with dialysis. 04/25/14   Luan Moore, MD  losartan (COZAAR) 100 MG tablet Take 100 mg by mouth every evening. 10/02/14   Historical Provider, MD  multivitamin (RENA-VIT) TABS tablet Take 1 tablet by mouth at bedtime. 04/25/14   Luan Moore, MD  Nutritional Supplements (FEEDING SUPPLEMENT, NEPRO CARB STEADY,) LIQD Take 237 mLs by mouth 2 (two) times daily between meals. Patient not taking: Reported on 04/12/2015 04/07/14   Jones Bales, MD   BP 200/92 mmHg  Pulse 83  Temp(Src) 98 F (36.7 C) (Oral)  Resp 16  Ht 5\' 8"  (1.727 m)  Wt 140 lb (63.504 kg)  BMI 21.29 kg/m2  SpO2 99% Physical Exam  Constitutional: [name] oriented to person, place, and time. He appears well-developed and well-nourished.  HENT:  Head: Normocephalic.  Mouth/Throat: Oropharynx is clear and moist.  Eyes: Conjunctivae are normal. Pupils are equal, round, and reactive to light.  Neck: Normal range of motion. Neck supple.  Cardiovascular: Normal rate, regular rhythm and normal heart sounds.   Pulmonary/Chest: Effort normal.  Crackles bilateral bases    Abdominal: Soft. Bowel sounds are normal. He exhibits no distension. There is no tenderness. There is no rebound.  Musculoskeletal: Normal range of motion. He exhibits no edema or tenderness.  Neurological: [name] alert and oriented to person, place, and time. No cranial nerve deficit. Coordination normal.  Skin: Skin is warm and dry.  Psychiatric: He has a normal mood and affect. His behavior is normal. Judgment and thought content normal.  Nursing note and vitals reviewed.   ED Course  Procedures (including critical care time) Labs Review Labs Reviewed  CBC WITH DIFFERENTIAL/PLATELET - Abnormal; Notable for the following:    RBC 3.71 (*)  Hemoglobin 10.1 (*)    HCT 32.4 (*)    RDW 18.4 (*)    All other components within normal limits  I-STAT CHEM 8, ED - Abnormal; Notable for the following:    Sodium 147 (*)    Potassium 6.3 (*)    BUN 133 (*)    Creatinine, Ser 14.40 (*)    Glucose, Bld 132 (*)    Calcium, Ion 0.72 (*)    Hemoglobin 12.2 (*)    HCT 36.0 (*)    All other components within normal limits  COMPREHENSIVE METABOLIC PANEL  ETHANOL  URINE RAPID DRUG SCREEN, HOSP PERFORMED  I-STAT TROPOININ, ED    Imaging Review Dg Chest 2 View  06/19/2015  CLINICAL DATA:  End-stage renal disease on dialysis, missed dialysis for a week, shortness of breath, feeling bad, chronic cough, hypertension, chronic diastolic CHF, smoker EXAM: CHEST  2 VIEW COMPARISON:  04/12/2015 FINDINGS: Enlargement of cardiac silhouette with pulmonary vascular congestion. Atherosclerotic calcification aorta. No definite acute fifth pulmonary edema or consolidation. No pleural effusion or pneumothorax. Bones demineralized. IMPRESSION: Enlargement of cardiac silhouette with pulmonary venous hypertension. No definite acute pulmonary edema. Electronically Signed   By: Lavonia Dana M.D.   On: 06/19/2015 08:14   I have personally reviewed and evaluated these images and lab results as part of my medical  decision-making.   EKG Interpretation   Date/Time:  Thursday June 19 2015 07:32:25 EST Ventricular Rate:  82 PR Interval:  200 QRS Duration: 101 QT Interval:  439 QTC Calculation: 513 R Axis:   74 Text Interpretation:  Sinus rhythm Left atrial enlargement Left  ventricular hypertrophy Anterior Q waves, possibly due to LVH Nonspecific  T abnormalities, lateral leads Prolonged QT interval No significant change  since last tracing Confirmed by Twylia Oka  MD, Debralee Braaksma ([id number]) on 06/19/2015 7:39:43  AM      MDM   Final diagnoses:  None   [name] is a 60 y.o. male here with tremors. Likely alcohol withdrawal. [name] alert and awake, no seizures at home. Also hasn't been dialyzed for a week so will check electrolytes. Will start on CIWA for alcohol withdrawal. Likely need admission.   8:17 AM K 6.3. CXR showed pulmonary hypertension, never hypoxic. Patient started on CIWA. Called Dr. Carmina Miller, who will dialyze today. Will admit for alcohol withdrawal and will get dialyzed inpatient.    Wandra Arthurs, MD 06/19/15 [phone]

## 2015-06-19 NOTE — Procedures (Signed)
I was present at this session.  I have reviewed the session itself and made appropriate changes.  HD via LUA avf, bp^^^, ^ vol off. Access press ok.  Marshell Dilauro L 3/2/20171:57 PM

## 2015-06-19 NOTE — ED Notes (Addendum)
Pt was at dialysis after skipping it for a week d/t "partying" - per staff, cocaine.  Dialysis staff called EMS b/c pt was having tremmors and they thought he was having a stroke.  Pt has no complaints.  No focal deficits.  Pt ambulated to the stretcher using his cane, which is normal.  cbg 213.

## 2015-06-19 NOTE — H&P (Signed)
Date: 06/19/2015               Patient Name:  [name] MRN: [id number]  DOB: [birth year] Age / Sex: 60 y.o., male   PCP: No primary care provider on file.         Medical Service: Internal Medicine Teaching Service         Attending Physician: Dr. Oval Linsey, MD    First Contact: Dr. Sherrye Payor  Pager: [phone]  Second Contact: Dr. Randell Patient Pager: [phone]       After Hours (After 5p/  First Contact Pager: [phone]  weekends / holidays): Second Contact Pager: [phone]   Chief Complaint: tremors   History of Present Illness:   [name] is a 60 year old man with past medical history of ESRD on HD, hypertension, secondary hyperparathyroidism, chronic diastolic CHF, H pylori gastritis, alcohol/tobacoo abuse, and seizure disorder who presents for evaluation of tremor by his outpatient HD site.  History obtained from chart review as pt currently drowsy after ativan administration in ED.   He was noted to be partying last week with cocaine, THC, and alcohol use and consequently was unable to attend HD. He drinks one to two 40 oz beers daily and was unable to afford to buy alcohol with last drink two days ago. He went to HD today and was noted to have tremors with concern for stroke so he was sent to [facility] for further evaluation. [name] reportably compliant with taking keppra for seizure disorder. No seizure activity was witnessed.   On arrival to the ED he was found to be hypertensive to 200/92 with no tachycardia. His ethanol level was negative. He had hyperkalemia with K of 6.8 with no EKG changes. He was given 2 g calcium gluconate, 1 amp bicarb, 5 U of Novolog, and 1 amp of D50 for hyperkalemia treatment and is to have HD this morning. He was found to have mild hand tremor but no neurological deficits. His CIWA score was 2 and was given 1 mg of Ativan with resulting drowsiness.    Meds:  No current facility-administered medications on file prior to encounter.    Current Outpatient Prescriptions on File Prior to Encounter  Medication Sig Dispense Refill  . calcium acetate (PHOSLO) 667 MG capsule Take 2,001 mg by mouth 3 (three) times daily with meals.     . Darbepoetin Alfa (ARANESP) 200 MCG/0.4ML SOSY injection Inject 0.4 mLs (200 mcg total) into the vein every Tuesday with hemodialysis. 1.68 mL   . doxercalciferol (HECTOROL) 4 MCG/2ML injection Inject 1.5 mLs (3 mcg total) into the vein Every Tuesday,Thursday,and Saturday with dialysis. 2 mL   . hydrALAZINE (APRESOLINE) 100 MG tablet Take 100 mg by mouth 2 (two) times daily. DON'T TAKE ON DIALYSIS DAYS    . levETIRAcetam (KEPPRA) 1000 MG tablet Take 1 tablet (1,000 mg total) by mouth daily. 30 tablet 0  . levETIRAcetam (KEPPRA) 500 MG tablet Take 1 tablet (500 mg total) by mouth Every Tuesday,Thursday,and Saturday with dialysis. 30 tablet 0  . losartan (COZAAR) 100 MG tablet Take 100 mg by mouth every evening.    . multivitamin (RENA-VIT) TABS tablet Take 1 tablet by mouth at bedtime. 30 each 0  . Nutritional Supplements (FEEDING SUPPLEMENT, NEPRO CARB STEADY,) LIQD Take 237 mLs by mouth 2 (two) times daily between meals. (Patient not taking: Reported on 04/12/2015)  0      Allergies: Allergies as of 06/19/2015 - Review Complete 06/19/2015  Allergen Reaction  Noted  . Reglan [metoclopramide] Other (See Comments) 03/02/2012   Past Medical History  Diagnosis Date  . Hypertension   . Anemia, chronic disease     a. Capsule endoscopy reportedly negative 08/2013. b. seen by heme with concern for minor B cell population on BMB, being observed.  . Chronic diastolic CHF (congestive heart failure) ([place])     a. EF 60 - 65% per [place] echo 11/2011. b. Echo 02/2012: severe LVH, focal basal hypertrophy, EF 60-65%, mild Mr.  . Cocaine abuse     mentioned in notes from [place]  . Hepatitis C antibody test positive     was HIV negative, 02/28/12  . Hepatitis B core antibody positive     03/01/10  .  Positive QuantiFERON-TB Gold test     11/2011  . Helicobacter pylori gastritis     not defined if this was treated  . Polyp of colon, adenomatous     May 2012.  Dr Trenton Founds in [place]  . Hematochezia   . Head injury, closed, with concussion ([place])   . History of blood transfusion   . Arthritis     "right shoulder" (11/09/2012)  . Seizure disorder [facility])     questionable history of - will need to clarify with PCP  . Headache(784.0)   . ESRD on hemodialysis ([place]) since 2012    a. Since 2012. ESRD was due to "drugs", primarily used cocaine.  Has 3-5 year hx of HTN, no DM.  Gets HD on TTS schedule at [place]. Originally from [place].   . Hypercalcemia   . Hyperpotassemia   . Tobacco abuse   . Protein-calorie malnutrition, severe ([place])   . Optic neuritis, left   . Hypertensive heart disease   . GI bleed 05/04/2012   Past Surgical History  Procedure Laterality Date  . Shoulder open rotator cuff repair Right   . Total knee arthroplasty Left   . Bascilic vein transposition  03/07/2012    Procedure: BASCILIC VEIN TRANSPOSITION;  Surgeon: Conrad [place], MD;  Location: [facility];  Service: Vascular;  Laterality: Left;  First Stage  . Insertion of dialysis catheter      right chest  . Bascilic vein transposition Left 05/31/2012    Procedure: BASCILIC VEIN TRANSPOSITION;  Surgeon: Conrad [place], MD;  Location: [facility];  Service: Vascular;  Laterality: Left;  Left 2nd Stage Basilic Vein Transposition with gortex graft revision using 80mmx10cm graft  . Shoulder open rotator cuff repair Right   . [name] capsule study N/A 08/27/2013    Procedure: [name] CAPSULE STUDY;  Surgeon: Milus Banister, MD;  Location: [facility];  Service: Endoscopy;  Laterality: N/A;   Family History  Problem Relation Age of Onset  . Diabetes Mother   . Hypertension Mother   . Stroke Mother   . Kidney failure Mother   . Cancer Father    Social History   Social History  . Marital Status: Single    Spouse Name:  N/A  . Number of Children: 1  . Years of Education: N/A   Occupational History  . Unemployed    Social History Main Topics  . Smoking status: Current Every Day Smoker -- 1.00 packs/day for 25 years    Types: Cigarettes  . Smokeless tobacco: Never Used  . Alcohol Use: No     Comment: 2 40's per day  . Drug Use: Yes    Special: "Crack" cocaine, Cocaine, Marijuana     Comment: crack cocaine  .  Sexual Activity: Yes    Birth Control/ Protection: None   Other Topics Concern  . Not on file   Social History Narrative    Review of Systems: Review of Systems  Unable to perform ROS: mental status change    Physical Exam: Blood pressure 200/92, pulse 83, temperature 98 F (36.7 C), temperature source Oral, resp. rate 16, height 5\' 8"  (1.727 m), weight 140 lb (63.504 kg), SpO2 99 %.  Physical Exam  Constitutional: He appears well-developed and well-nourished. No distress.  HENT:  Head: Normocephalic and atraumatic.  Right Ear: External ear normal.  Left Ear: External ear normal.  Nose: Nose normal.  Mouth/Throat: Oropharynx is clear and moist. No oropharyngeal exudate.  Eyes: EOM are normal. Right eye exhibits no discharge. Left eye exhibits no discharge. No scleral icterus.  Neck: Normal range of motion. Neck supple.  Cardiovascular: Normal rate and regular rhythm.   Murmur heard. Pulmonary/Chest: Effort normal and breath sounds normal. No respiratory distress. He has no wheezes. He has no rales.  Clear to ausculation in anterior fields.   Abdominal: Soft. Bowel sounds are normal. He exhibits no distension. There is no tenderness. There is no rebound and no guarding.  Musculoskeletal: Normal range of motion. He exhibits no edema or tenderness.  Neurological:  Drowsy, unable to follow eyes, intermittently opens eyes with tactile stimulation   Skin: Skin is warm and dry. No rash noted. [name] not diaphoretic. No erythema. No pallor.     Lab results: Basic Metabolic  Panel:  Recent Labs  06/19/15 0753 06/19/15 0756  NA 148* 147*  K 6.8* 6.3*  CL 107 109  CO2 19*  --   GLUCOSE 146* 132*  BUN 144* 133*  CREATININE 15.16* 14.40*  CALCIUM 6.3*  --    Liver Function Tests:  Recent Labs  06/19/15 0753  AST 27  ALT 28  ALKPHOS 87  BILITOT 0.5  PROT 7.2  ALBUMIN 2.8*   CBC:  Recent Labs  06/19/15 0753 06/19/15 0756  WBC 5.4  --   NEUTROABS 3.7  --   HGB 10.1* 12.2*  HCT 32.4* 36.0*  MCV 87.3  --   PLT 249  --     Alcohol Level:  Recent Labs  06/19/15 0754  ETH <5    Imaging results:  Dg Chest 2 View  06/19/2015  CLINICAL DATA:  End-stage renal disease on dialysis, missed dialysis for a week, shortness of breath, feeling bad, chronic cough, hypertension, chronic diastolic CHF, smoker EXAM: CHEST  2 VIEW COMPARISON:  04/12/2015 FINDINGS: Enlargement of cardiac silhouette with pulmonary vascular congestion. Atherosclerotic calcification aorta. No definite acute fifth pulmonary edema or consolidation. No pleural effusion or pneumothorax. Bones demineralized. IMPRESSION: Enlargement of cardiac silhouette with pulmonary venous hypertension. No definite acute pulmonary edema. Electronically Signed   By: Lavonia Dana M.D.   On: 06/19/2015 08:14    Other results: EKG:   Ventricular Rate: 82 PR Interval: 200 QRS Duration: 101 QT Interval: 439 QTC Calculation: 513 R Axis: 74 Text Interpretation: Sinus rhythm Left atrial enlargement Left ventricular hypertrophy Anterior Q waves, possibly due to LVH Nonspecific T abnormalities, lateral leads Prolonged QT interval No significant change since last tracing     Assessment & Plan by Problem:  Alcohol Withdrawal - Pt with daily alcohol use (1-2 40 oz beers) with no use in last 2 days due to inability to afford it. Pt with hand tremors on arrival with CIWA score of 2 and received 1 mg of  Ativan. Pt with no reported seizure activity.  -CIWA protocol -SW consult -Counsel on  cessation   Hyperkalemia in setting of ESRD on HD - K 6.3 without EKG changes in setting of missing HD for past 1 week. Pt was treated with Novolog 5 U, 1 amp D50, calcium gluconate, and 1 amp bicarb in the ED.   -HD today -Monitor renal function panel  -Renal diet and rena-vit daily  -Hectoral with HD  -Restart home phoslo 2001 mg TID with meals   Hypertension - BP in 200's on arrival. Unclear if pt compliant with home meds.  -HD today -Restart home losartan 100 mg daily and hydralazine 100 mg BID on non-HD  days (tomm)  Seizure disorder - Pt with no reported seizure activity.  -Obtain keppra level  -Restart home keppra 1000 mg daily and 500 mg TTS with HD  Anemia of renal disease -Hg 10.1 at baseline with no active bleeding. Pt has history of GI bleed in December 2016.  -Obtain iron and TIBC -If TIBC< 20% needs iron repletion -Continue weekly aranesop with HD (Tue) to keep Hg 10-11  Elevated TSH level - Pt with elevated TSH level of 6.152 on 1224/16 possibly due to sick euthyroid.   -Repeat TSH level  History of H. Pylori gastritis - Pt with positive IgG level to H pylori on 04/12/15. Unclear if was ever treated with medical therapy.  -Obtain stool H pylori to ensure resolution  Substance Abuse - Pt with reported cocaine and THC use.  -Follow-up UDS -SW consult -Counsel on cessation    Diet: Renal  DVT Ppx: SQ heparin TID Code: Full (need to confirm)   Dispo: Disposition is deferred at this time, awaiting improvement of current medical problems. Anticipated discharge in approximately 1-3 day(s).   The patient does have a current PCP (No primary care provider on file.) and does need an [facility] hospital follow-up appointment after discharge.  The patient does not have transportation limitations that hinder transportation to clinic appointments.  Signed: Juluis Mire, MD 06/19/2015, 9:36 AM

## 2015-06-20 DIAGNOSIS — N2581 Secondary hyperparathyroidism of renal origin: Secondary | ICD-10-CM | POA: Diagnosis present

## 2015-06-20 DIAGNOSIS — G9340 Encephalopathy, unspecified: Secondary | ICD-10-CM | POA: Diagnosis present

## 2015-06-20 DIAGNOSIS — N186 End stage renal disease: Secondary | ICD-10-CM | POA: Diagnosis present

## 2015-06-20 DIAGNOSIS — F17210 Nicotine dependence, cigarettes, uncomplicated: Secondary | ICD-10-CM | POA: Diagnosis present

## 2015-06-20 DIAGNOSIS — N19 Unspecified kidney failure: Secondary | ICD-10-CM | POA: Insufficient documentation

## 2015-06-20 DIAGNOSIS — F10239 Alcohol dependence with withdrawal, unspecified: Secondary | ICD-10-CM | POA: Diagnosis present

## 2015-06-20 DIAGNOSIS — Z9115 Patient's noncompliance with renal dialysis: Secondary | ICD-10-CM | POA: Diagnosis not present

## 2015-06-20 DIAGNOSIS — Z992 Dependence on renal dialysis: Secondary | ICD-10-CM | POA: Diagnosis not present

## 2015-06-20 DIAGNOSIS — Z8619 Personal history of other infectious and parasitic diseases: Secondary | ICD-10-CM | POA: Diagnosis present

## 2015-06-20 DIAGNOSIS — D631 Anemia in chronic kidney disease: Secondary | ICD-10-CM | POA: Diagnosis present

## 2015-06-20 DIAGNOSIS — E875 Hyperkalemia: Secondary | ICD-10-CM | POA: Diagnosis present

## 2015-06-20 DIAGNOSIS — R4182 Altered mental status, unspecified: Secondary | ICD-10-CM | POA: Diagnosis not present

## 2015-06-20 DIAGNOSIS — F1410 Cocaine abuse, uncomplicated: Secondary | ICD-10-CM | POA: Diagnosis present

## 2015-06-20 DIAGNOSIS — I5032 Chronic diastolic (congestive) heart failure: Secondary | ICD-10-CM | POA: Diagnosis present

## 2015-06-20 DIAGNOSIS — G40909 Epilepsy, unspecified, not intractable, without status epilepticus: Secondary | ICD-10-CM | POA: Diagnosis present

## 2015-06-20 DIAGNOSIS — I132 Hypertensive heart and chronic kidney disease with heart failure and with stage 5 chronic kidney disease, or end stage renal disease: Secondary | ICD-10-CM | POA: Diagnosis present

## 2015-06-20 DIAGNOSIS — F1290 Cannabis use, unspecified, uncomplicated: Secondary | ICD-10-CM | POA: Diagnosis present

## 2015-06-20 DIAGNOSIS — Z9119 Patient's noncompliance with other medical treatment and regimen: Secondary | ICD-10-CM | POA: Diagnosis not present

## 2015-06-20 LAB — RENAL FUNCTION PANEL
Albumin: 2.5 g/dL — ABNORMAL LOW (ref 3.5–5.0)
Anion gap: 17 — ABNORMAL HIGH (ref 5–15)
BUN: 60 mg/dL — ABNORMAL HIGH (ref 6–20)
CO2: 22 mmol/L (ref 22–32)
Calcium: 7 mg/dL — ABNORMAL LOW (ref 8.9–10.3)
Chloride: 102 mmol/L (ref 101–111)
Creatinine, Ser: 9.29 mg/dL — ABNORMAL HIGH (ref 0.61–1.24)
GFR calc Af Amer: 6 mL/min — ABNORMAL LOW (ref >60)
GFR calc non Af Amer: 6 mL/min — ABNORMAL LOW (ref >60)
Glucose, Bld: 142 mg/dL — ABNORMAL HIGH (ref 65–99)
Phosphorus: 8.4 mg/dL — ABNORMAL HIGH (ref 2.5–4.6)
Potassium: 4.4 mmol/L (ref 3.5–5.1)
Sodium: 141 mmol/L (ref 135–145)

## 2015-06-20 LAB — HIV ANTIBODY (ROUTINE TESTING W REFLEX): HIV Screen 4th Generation wRfx: NONREACTIVE

## 2015-06-20 LAB — IRON AND TIBC
Iron: 45 ug/dL (ref 45–182)
SATURATION RATIOS: 21 % (ref 17.9–39.5)
TIBC: 210 ug/dL — AB (ref 250–450)
UIBC: 165 ug/dL

## 2015-06-20 LAB — HEPATITIS B CORE ANTIBODY, TOTAL: Hep B Core Total Ab: POSITIVE — AB

## 2015-06-20 LAB — CBC
HCT: 31.1 % — ABNORMAL LOW (ref 39.0–52.0)
Hemoglobin: 9.6 g/dL — ABNORMAL LOW (ref 13.0–17.0)
MCH: 27.7 pg (ref 26.0–34.0)
MCHC: 30.9 g/dL (ref 30.0–36.0)
MCV: 89.9 fL (ref 78.0–100.0)
Platelets: 200 K/uL (ref 150–400)
RBC: 3.46 MIL/uL — ABNORMAL LOW (ref 4.22–5.81)
RDW: 18.8 % — ABNORMAL HIGH (ref 11.5–15.5)
WBC: 6.4 K/uL (ref 4.0–10.5)

## 2015-06-20 LAB — MAGNESIUM: Magnesium: 2.1 mg/dL (ref 1.7–2.4)

## 2015-06-20 LAB — HEPATITIS B SURFACE ANTIGEN: Hepatitis B Surface Ag: NEGATIVE

## 2015-06-20 LAB — HEPATITIS B SURFACE ANTIBODY,QUALITATIVE: Hep B S Ab: NONREACTIVE

## 2015-06-20 NOTE — Discharge Summary (Signed)
Name: [name] MRN: [id number] DOB: 04/15/1956 60 y.o. PCP: No primary care provider on file.  Date of Admission: 06/19/2015  7:31 AM Date of Discharge: 06/20/2015 Attending Physician: Oval Linsey, MD  Discharge Diagnosis: 1. AMS 2/2 Metabolic Derangement after missed dialysis   Principal Problem:   Alcohol withdrawal ([place]) Active Problems:   Hypertension   ESRD on hemodialysis ([place])   Seizure disorder ([place])   Cocaine abuse   Tobacco abuse   Anemia of chronic kidney failure   Hyperparathyroidism, secondary renal (HCC)   Prolonged Q-T interval on ECG   Bleeding gastrointestinal   History of hepatitis B virus infection   Acute encephalopathy   Acute uremia   Hyperkalemia  Discharge Medications:   Medication List    TAKE these medications        calcium acetate 667 MG capsule  Commonly known as:  PHOSLO  Take 2,001 mg by mouth 3 (three) times daily with meals.     Darbepoetin Alfa 200 MCG/0.4ML Sosy injection  Commonly known as:  ARANESP  Inject 0.4 mLs (200 mcg total) into the vein every Tuesday with hemodialysis.     doxercalciferol 4 MCG/2ML injection  Commonly known as:  HECTOROL  Inject 1.5 mLs (3 mcg total) into the vein Every Tuesday,Thursday,and Saturday with dialysis.     feeding supplement (NEPRO CARB STEADY) Liqd  Take 237 mLs by mouth 2 (two) times daily between meals.     hydrALAZINE 100 MG tablet  Commonly known as:  APRESOLINE  Take 100 mg by mouth 2 (two) times daily. DON'T TAKE ON DIALYSIS DAYS     levETIRAcetam 1000 MG tablet  Commonly known as:  KEPPRA  Take 1 tablet (1,000 mg total) by mouth daily.     levETIRAcetam 500 MG tablet  Commonly known as:  KEPPRA  Take 1 tablet (500 mg total) by mouth Every Tuesday,Thursday,and Saturday with dialysis.     losartan 100 MG tablet  Commonly known as:  COZAAR  Take 100 mg by mouth every evening.     multivitamin Tabs tablet  Take 1 tablet by mouth at bedtime.        Disposition and  follow-up:   [name] was discharged from [facility] in Stable condition.  At the hospital follow up visit please address:  1.  Alcohol cessation, dialysis adherence, medication adherence  2.  Labs / imaging needed at time of follow-up: none  3.  Pending labs/ test needing follow-up: none  Follow-up Appointments: Follow-up Information    Follow up with [facility]. Schedule an appointment as soon as possible for a visit in 2 weeks.   Why:  Establish care with PCP. [facility] will contact patient for as follow up appt. Thank you.    Contact information:   [address] [place] [phone]      Discharge Instructions: Discharge Instructions    Call MD for:  extreme fatigue    Complete by:  As directed      Call MD for:  persistant dizziness or light-headedness    Complete by:  As directed      Call MD for:  persistant nausea and vomiting    Complete by:  As directed      Diet - low sodium heart healthy    Complete by:  As directed      Increase activity slowly    Complete by:  As directed  Consultations: Treatment Team:  Rexene Agent, MD  Procedures Performed:  Dg Chest 2 View  06/19/2015  CLINICAL DATA:  End-stage renal disease on dialysis, missed dialysis for a week, shortness of breath, feeling bad, chronic cough, hypertension, chronic diastolic CHF, smoker EXAM: CHEST  2 VIEW COMPARISON:  04/12/2015 FINDINGS: Enlargement of cardiac silhouette with pulmonary vascular congestion. Atherosclerotic calcification aorta. No definite acute fifth pulmonary edema or consolidation. No pleural effusion or pneumothorax. Bones demineralized. IMPRESSION: Enlargement of cardiac silhouette with pulmonary venous hypertension. No definite acute pulmonary edema. Electronically Signed   By: Lavonia Dana M.D.   On: 06/19/2015 08:14    2D Echo:   Cardiac Cath:   Admission HPI: [name] is a 61 year old man with past medical history of ESRD on HD, hypertension, secondary hyperparathyroidism, chronic diastolic CHF, H pylori gastritis, alcohol/tobacoo abuse, and seizure disorder who presents for evaluation of tremor by his outpatient HD site.  History obtained from chart review as pt currently drowsy after ativan administration in ED.   He was noted to be partying last week with cocaine, THC, and alcohol use and consequently was unable to attend HD. He drinks one to two 40 oz beers daily and was unable to afford to buy alcohol with last drink two days ago. He went to HD today and was noted to have tremors with concern for stroke so he was sent to [facility] for further evaluation. [name] reportably compliant with taking keppra for seizure disorder. No seizure activity was witnessed.   On arrival to the ED he was found to be hypertensive to 200/92 with no tachycardia. His ethanol level was negative. He had hyperkalemia with K of 6.8 with no EKG changes. He was given 2 g calcium gluconate, 1 amp bicarb, 5 U of Novolog, and 1 amp of D50 for hyperkalemia treatment and is to have HD this morning. He was found to have mild hand tremor but no neurological deficits. His CIWA score was 2 and was given 1 mg of Ativan with resulting drowsiness.    Hospital Course by problem list: Principal Problem:   Alcohol withdrawal ([place]) Active Problems:   Hypertension   ESRD on hemodialysis (H[facility])   Seizure disorder (H[facility])   Cocaine abuse   Tobacco abuse   Anemia of chronic kidney failure   Hyperparathyroidism, secondary renal (HCC)   Prolonged Q-T interval on ECG   Bleeding gastrointestinal   History of hepatitis B virus infection   Acute encephalopathy   Acute uremia   Hyperkalemia   AMS: Patient presented with AMS and elevated potassium and BUN after missing dialysis.  His mental status normalized following his dialysis sessions.  Due to his alcohol history, there was concern for  alcohol withdrawal.  However, his CIWA scores were minimally elevated (max 6).  Therefore, his symptoms were felt to be most consistent with metabolic derangement.  At follow up, please reinforce alcohol cessation and dialysis adherence.  ESRD on TTS HD: Nephrology consulted, patient had HD session on Thurs 06/19/2015 with short off-schedule session on Friday 06/20/2015. Patient felt improved after dialysis and discharged after short HD completed.  Discharge Vitals:   BP 133/67 mmHg  Pulse 85  Temp(Src) 97.9 F (36.6 C) (Oral)  Resp 18  Ht 5\' 9"  (1.753 m)  Wt 138 lb 14.2 oz (63 kg)  BMI 20.50 kg/m2  SpO2 95%  Discharge Labs:  Results for orders placed or performed during the hospital encounter of 06/19/15 (from the past 24 hour(s))  Renal function panel     Status: Abnormal   Collection Time: 06/19/15  4:18 PM  Result Value Ref Range   Sodium 140 135 - 145 mmol/L   Potassium 4.0 3.5 - 5.1 mmol/L   Chloride 101 101 - 111 mmol/L   CO2 23 22 - 32 mmol/L   Glucose, Bld 65 65 - 99 mg/dL   BUN 47 (H) 6 - 20 mg/dL   Creatinine, Ser 6.80 (H) 0.61 - 1.24 mg/dL   Calcium 7.1 (L) 8.9 - 10.3 mg/dL   Phosphorus 5.0 (H) 2.5 - 4.6 mg/dL   Albumin 2.7 (L) 3.5 - 5.0 g/dL   GFR calc non Af Amer 8 (L) >60 mL/min   GFR calc Af Amer 9 (L) >60 mL/min   Anion gap 16 (H) 5 - 15  CBC     Status: Abnormal   Collection Time: 06/19/15  4:18 PM  Result Value Ref Range   WBC 4.9 4.0 - 10.5 K/uL   RBC 3.50 (L) 4.22 - 5.81 MIL/uL   Hemoglobin 9.4 (L) 13.0 - 17.0 g/dL   HCT 30.1 (L) 39.0 - 52.0 %   MCV 86.0 78.0 - 100.0 fL   MCH 26.9 26.0 - 34.0 pg   MCHC 31.2 30.0 - 36.0 g/dL   RDW 18.3 (H) 11.5 - 15.5 %   Platelets 239 150 - 400 K/uL  MRSA PCR Screening     Status: None   Collection Time: 06/19/15  4:43 PM  Result Value Ref Range   MRSA by PCR NEGATIVE NEGATIVE  Magnesium     Status: None   Collection Time: 06/20/15  6:41 AM  Result Value Ref Range   Magnesium 2.1 1.7 - 2.4 mg/dL  Iron and TIBC      Status: Abnormal   Collection Time: 06/20/15  6:41 AM  Result Value Ref Range   Iron 45 45 - 182 ug/dL   TIBC 210 (L) 250 - 450 ug/dL   Saturation Ratios 21 17.9 - 39.5 %   UIBC 165 ug/dL  HIV antibody     Status: None   Collection Time: 06/20/15  6:41 AM  Result Value Ref Range   HIV Screen 4th Generation wRfx Non Reactive Non Reactive  CBC     Status: Abnormal   Collection Time: 06/20/15  2:13 PM  Result Value Ref Range   WBC 6.4 4.0 - 10.5 K/uL   RBC 3.46 (L) 4.22 - 5.81 MIL/uL   Hemoglobin 9.6 (L) 13.0 - 17.0 g/dL   HCT 31.1 (L) 39.0 - 52.0 %   MCV 89.9 78.0 - 100.0 fL   MCH 27.7 26.0 - 34.0 pg   MCHC 30.9 30.0 - 36.0 g/dL   RDW 18.8 (H) 11.5 - 15.5 %   Platelets 200 150 - 400 K/uL  Renal function panel     Status: Abnormal   Collection Time: 06/20/15  2:14 PM  Result Value Ref Range   Sodium 141 135 - 145 mmol/L   Potassium 4.4 3.5 - 5.1 mmol/L   Chloride 102 101 - 111 mmol/L   CO2 22 22 - 32 mmol/L   Glucose, Bld 142 (H) 65 - 99 mg/dL   BUN 60 (H) 6 - 20 mg/dL   Creatinine, Ser 9.29 (H) 0.61 - 1.24 mg/dL   Calcium 7.0 (L) 8.9 - 10.3 mg/dL   Phosphorus 8.4 (H) 2.5 - 4.6 mg/dL   Albumin 2.5 (L) 3.5 - 5.0 g/dL   GFR calc non Af Amer 6 (L) >  60 mL/min   GFR calc Af Amer 6 (L) >60 mL/min   Anion gap 17 (H) 5 - 15    Signed: Zada Finders, MD 06/20/2015, 3:01 PM    Services Ordered on Discharge: none Equipment Ordered on Discharge: none

## 2015-06-20 NOTE — Discharge Instructions (Signed)
1. Decrease alcohol intake and abstain from other drugs. 2. Go to your dialysis sessions as scheduled.  They are very important.

## 2015-06-20 NOTE — Progress Notes (Signed)
Patient noticed walking by himself to the bathroom without his cane. After patient walked out of bathroom. He began to fall backwards and was caught by the RN. Patient was then guided back to his bed and seated down. RN encouraged him to use his call bell to let RN know he needs to go to the bathroom; patient stated, "I'll try." bed alarm turned on and will continue to monitor.

## 2015-06-20 NOTE — H&P (Signed)
Internal Medicine Attending Admission Note Date: 06/20/2015  Patient name: [name] Medical record number: [id number] Date of birth: 01/31/1956 Age: 60 y.o. Gender: male  I saw and evaluated the patient. I reviewed the resident's note and I agree with the resident's findings and plan as documented in the resident's note.  Chief Complaint(s): Tremors and hyperkalemia.  History - key components related to admission:  [name] is a 60 year old man with end-stage renal disease requiring hemodialysis, alcohol and tobacco abuse, hypertension, and chronic diastolic heart failure who presents to the emergency department after being sent there by his hemodialysis unit in [facility] because of concerns for a stroke. Apparently, the patient missed a weeks worth of hemodialysis sessions because he was out partying with cocaine. When he presented to the hemodialysis unit he was noted to be weak with tremors. EMS was therefore called and he was transferred to the emergency department. In the emergency department he was noted to be hypertensive but not tachycardic, or diaphoretic. He did have a tremor but the patient states this is old. His alcohol level was negative but was noted to have hyperkalemia at 6.8 with peaked T waves on EKG. He was therefore admitted to the internal medicine teaching service and underwent dialysis.  When seen on rounds the morning following dialysis he felt well. He was somnolent but arousable having just received Ativan. He was without any complaints.  Physical Exam - key components related to admission:  Filed Vitals:   06/19/15 1649 06/19/15 2145 06/20/15 0351 06/20/15 0900  BP: 189/75 156/68 128/55 152/70  Pulse: 88 86 78 79  Temp: 98.9 F (37.2 C) 98.8 F (37.1 C) 99.3 F (37.4 C) 98.9 F (37.2 C)  TempSrc: Oral Oral Oral Oral  Resp:  18 18 18   Height: 5\' 9"  (1.753 m)     Weight:      SpO2: 95% 97% 98% 96%   Gen.: Well-developed, well-nourished, man lying  comfortably in bed in no acute distress. He was somnolent but easily arousable. There were no noted tremors or diaphoresis. Lungs: Clear to auscultation bilaterally without wheezes, rhonchi, or rales. Heart: Regular rate and rhythm without murmurs, rubs, or gallops. Abdomen: Soft, nontender, active bowel sounds. Extremities: Dry skin over the shins without edema.  Lab results:  Basic Metabolic Panel:  Recent Labs  06/19/15 0943 06/19/15 1618 06/20/15 0641  NA 148* 140  --   K 6.3* 4.0  --   CL 107 101  --   CO2 17* 23  --   GLUCOSE 128* 65  --   BUN 147* 47*  --   CREATININE 15.35* 6.80*  --   CALCIUM 6.1* 7.1*  --   MG  --   --  2.1  PHOS 14.1* 5.0*  --    Liver Function Tests:  Recent Labs  06/19/15 0753 06/19/15 0943 06/19/15 1618  AST 27  --   --   ALT 28  --   --   ALKPHOS 87  --   --   BILITOT 0.5  --   --   PROT 7.2  --   --   ALBUMIN 2.8* 2.6* 2.7*   CBC:  Recent Labs  06/19/15 0753 06/19/15 0756 06/19/15 1618  WBC 5.4  --  4.9  NEUTROABS 3.7  --   --   HGB 10.1* 12.2* 9.4*  HCT 32.4* 36.0* 30.1*  MCV 87.3  --  86.0  PLT 249  --  239   Thyroid Function Tests:  Recent Labs  06/19/15 0943  TSH 2.822   Anemia Panel:  Recent Labs  06/20/15 0641  TIBC 210*  IRON 45   Alcohol Level:  Recent Labs  06/19/15 [place] <5   Misc. Labs:  Hepatitis B surface antigen and surface antibody negative Hepatitis B core antibody positive Hepatitis C antibody pending HIV antibody pending  Imaging results:  Dg Chest 2 View  06/19/2015  CLINICAL DATA:  End-stage renal disease on dialysis, missed dialysis for a week, shortness of breath, feeling bad, chronic cough, hypertension, chronic diastolic CHF, smoker EXAM: CHEST  2 VIEW COMPARISON:  04/12/2015 FINDINGS: Enlargement of cardiac silhouette with pulmonary vascular congestion. Atherosclerotic calcification aorta. No definite acute fifth pulmonary edema or consolidation. No pleural effusion or  pneumothorax. Bones demineralized. IMPRESSION: Enlargement of cardiac silhouette with pulmonary venous hypertension. No definite acute pulmonary edema. Electronically Signed   By: Lavonia Dana M.D.   On: 06/19/2015 08:14   Other results:  EKG: Normal sinus rhythm at 82 bpm, normal axis, normal intervals, deep septal Q waves in V1 and V2, LVH by voltage in the anterior leads, lead reversal of V5 and V6, delayed R wave progression, no ST segment changes, peaked T waves. Other than the lead reversal and peaked T waves there are no other significant changes in the ECG compared to 04/12/2015.  Assessment & Plan by Problem:  [name] is a 60 year old man with end-stage renal disease requiring hemodialysis, alcohol and tobacco abuse, hypertension, and chronic diastolic heart failure who presents to the emergency department after being sent there by his hemodialysis unit in [facility] because of concerns for a stroke. Apparently, the patient missed a weeks worth of hemodialysis sessions because he was out partying with cocaine. The patient did not have a stroke as he had no clinical syndrome consistent with such. There was the concern he may have had some alcohol withdrawal because of the tremors and hypertension but he was not diaphoretic, tachycardic, or hyper sympathetic. He felt improved after receiving hemodialysis and likely had a mild toxic encephalopathy with uremia as the cause of most of his symptoms. He was uremic because he missed a weeks worth of hemodialysis.  1) Uremia secondary to hemodialysis noncompliance: Resolved with hemodialysis. [name] back to baseline and is stable for discharge home after another run of dialysis today.  2) Disposition: He will be discharged home today after hemodialysis and will restart his usual schedule of hemodialysis at [facility] after discharge.

## 2015-06-20 NOTE — Clinical Social Work Note (Addendum)
Patient medically stable for discharge and will be transported home this evening via taxi.   Jaquisha Frech [name], MSW, LCSW Licensed Clinical Social Worker [place] [phone]

## 2015-06-20 NOTE — Progress Notes (Signed)
[facility] Progress Note  Assessment/Plan: 1. Hyperkalemia with elevated BUN/CR: Noncompliance with HD/Polysubstance abuse. K+ 4.0 now. Resolved.  2. Substance abuse/agitation- crack/etoh -per dialysis staff, pt reported he has been on a binge; Currently awake, lucid, CIWA protocol DC'd. Possible DC today.  3. Dialysis noncompliance - ongoing issue - Dr. Mercy Moore has continued to discussed considering stopping dialysis, but pt has elected to continue with varying compliance 4. ESRD - TTS -Will have short HD today then regular HD at [place] tomorrow.  5. Hypertension/volume - HD yesterday net UF 2541. Post wt 61.5 kg approx 4 kgs above OP EDW. Attempt 3.5-4 liters today.  6. Anemia - continue ESA due next week - changed Fe to weekly. HGB 9.4 03/02. Recheck today.  7. Metabolic bone disease - Continue hectorol, binders- noncompliant with binders as outpt however phos 5.0. Ca 7.1 C Ca 8.14 8. Nutrition -Albumin 2.7 Renal diet/renal vit.  9. Seizure disorder - on keppra 10. Hep C + 11. Hx GIB - on no heparin HD  Gerald Kuehl H. Aerin Delany NP-C 06/20/2015, 9:37 AM  [facility] [phone]  Subjective: "Can you bring me some graham crackers" Alert, oriented to person, place. Appears calm, following instructions. Denies chest pain/Sob. No perspiration, visible tremors. Lying quietly in bed.   Objective Filed Vitals:   06/19/15 1430 06/19/15 1649 06/19/15 2145 06/20/15 0351  BP: 223/97 189/75 156/68 128/55  Pulse: 89 88 86 78  Temp: 98.7 F (37.1 C) 98.9 F (37.2 C) 98.8 F (37.1 C) 99.3 F (37.4 C)  TempSrc: Oral Oral Oral Oral  Resp: 20  18 18   Height:  5\' 9"  (1.753 m)    Weight: 61.5 kg (135 lb 9.3 oz)     SpO2:  95% 97% 98%   Physical Exam General: Chronically ill appearing male in NAD Heart: S1, S2 II'/VI systolic M  Lungs: Bilateral breath sounds with coarse crackles in bases 1/4 ^. No WOB Abdomen: soft, nontender with active BS Extremities: No LE  Edema. Skin dry, excoriated. Dialysis Access: LUA AVF + bruit  Dialysis Orders: [facility] TueThuSat, 4 hrs 0 min, 180NRe Optiflux, BFR 400, DFR Autoflow 1.5, EDW 57.5 (kg), Dialysate 3.0 K, 2.25 Ca, UFR Profile: Profile 2, Sodium Model: None, Access: AV Fistula Heparin: no heparin Mircera: 225 mcg IV q 2 weeks (last dose 06/12/15) Venofer: 50 mg IV weekly   Additional Objective Labs: Basic Metabolic Panel:  Recent Labs Lab 06/19/15 0753 06/19/15 0756 06/19/15 0943 06/19/15 1618  NA 148* 147* 148* 140  K 6.8* 6.3* 6.3* 4.0  CL 107 109 107 101  CO2 19*  --  17* 23  GLUCOSE 146* 132* 128* 65  BUN 144* 133* 147* 47*  CREATININE 15.16* 14.40* 15.35* 6.80*  CALCIUM 6.3*  --  6.1* 7.1*  PHOS  --   --  14.1* 5.0*   Liver Function Tests:  Recent Labs Lab 06/19/15 0753 06/19/15 0943 06/19/15 1618  AST 27  --   --   ALT 28  --   --   ALKPHOS 87  --   --   BILITOT 0.5  --   --   PROT 7.2  --   --   ALBUMIN 2.8* 2.6* 2.7*   No results for input(s): LIPASE, AMYLASE in the last 168 hours. CBC:  Recent Labs Lab 06/19/15 0753 06/19/15 0756 06/19/15 1618  WBC 5.4  --  4.9  NEUTROABS 3.7  --   --   HGB 10.1* 12.2* 9.4*  HCT  32.4* 36.0* 30.1*  MCV 87.3  --  86.0  PLT 249  --  239   Blood Culture    Component Value Date/Time   SDES BLOOD RIGHT FOREARM 04/21/2014 0700   SPECREQUEST BOTTLES DRAWN AEROBIC AND ANAEROBIC 10CC EA 04/21/2014 0700   CULT  04/21/2014 0700    NO GROWTH 5 DAYS Performed at [facility] 04/27/2014 FINAL 04/21/2014 0700    Cardiac Enzymes: No results for input(s): CKTOTAL, CKMB, CKMBINDEX, TROPONINI in the last 168 hours. CBG: No results for input(s): GLUCAP in the last 168 hours. Iron Studies:  Recent Labs  06/20/15 0641  IRON 45  TIBC 210*   @[email or web address] @ Studies/Results: Dg Chest 2 View  06/19/2015  CLINICAL DATA:  End-stage renal disease on dialysis, missed dialysis for a week, shortness of breath, feeling  bad, chronic cough, hypertension, chronic diastolic CHF, smoker EXAM: CHEST  2 VIEW COMPARISON:  04/12/2015 FINDINGS: Enlargement of cardiac silhouette with pulmonary vascular congestion. Atherosclerotic calcification aorta. No definite acute fifth pulmonary edema or consolidation. No pleural effusion or pneumothorax. Bones demineralized. IMPRESSION: Enlargement of cardiac silhouette with pulmonary venous hypertension. No definite acute pulmonary edema. Electronically Signed   By: Lavonia Dana M.D.   On: 06/19/2015 08:14   Medications:   . calcium acetate  2,001 mg Oral TID WC  . [START ON 06/26/2015] darbepoetin (ARANESP) injection - DIALYSIS  200 mcg Intravenous Q Thu-HD  . doxercalciferol  6 mcg Intravenous Q T,Th,Sa-HD  . [START ON 06/21/2015] ferric gluconate (FERRLECIT/NULECIT) IV  62.5 mg Intravenous Q Sat-HD  . heparin  5,000 Units Subcutaneous 3 times per day  . hydrALAZINE  100 mg Oral BID  . levETIRAcetam  1,000 mg Oral Daily  . [START ON 06/21/2015] levETIRAcetam  500 mg Oral Q T,Th,Sa-HD  . losartan  100 mg Oral QPM  . multivitamin  1 tablet Oral QHS  . thiamine  100 mg Intravenous Daily  . thiamine  100 mg Oral Daily

## 2015-06-20 NOTE — Progress Notes (Signed)
   Subjective: Patient feels better today after receiving dialysis yesterday. He says he is not having anymore tremors. He ate dinner yesterday and has breakfast during rounds. He denies any N/V/D/C. He feels ready to go home after dialysis.  Objective: Vital signs in last 24 hours: Filed Vitals:   06/19/15 1649 06/19/15 2145 06/20/15 0351 06/20/15 0900  BP: 189/75 156/68 128/55 152/70  Pulse: 88 86 78 79  Temp: 98.9 F (37.2 C) 98.8 F (37.1 C) 99.3 F (37.4 C) 98.9 F (37.2 C)  TempSrc: Oral Oral Oral Oral  Resp:  18 18 18   Height: 5\' 9"  (1.753 m)     Weight:      SpO2: 95% 97% 98% 96%   Weight change:   Intake/Output Summary (Last 24 hours) at 06/20/15 1103 Last data filed at 06/20/15 0900  Gross per 24 hour  Intake    240 ml  Output   2541 ml  Net  -2301 ml   General: resting in bed, calm Cardiac: RRR, systolic murmur at upper sternal borders Pulm: clear to auscultation bilaterally, moving normal volumes of air Abd: soft, nontender, nondistended, BS present Ext: dry cracked skin b/l LE, no pedal edema Neuro: alert and oriented X3  Assessment/Plan: Principal Problem:   Alcohol withdrawal (HCC) Active Problems:   Hypertension   ESRD on hemodialysis (HCC)   Seizure disorder (HCC)   Cocaine abuse   Tobacco abuse   Anemia of chronic kidney failure   Hyperparathyroidism, secondary renal (HCC)   Prolonged Q-T interval on ECG   Bleeding gastrointestinal   History of hepatitis B virus infection   Acute encephalopathy  AMS 2/2 Metabolic Derangement, resolved: Patient's tremors and normalization of mental status after dialysis lend toward metabolic derangement in the setting of missed dialysis.  Patient calm and oriented this morning.  CIWA scores have remained overall low (6).  Therefore, his AMS does not appear to be due to alcohol withdrawal.  Currently calm, oriented, and without agitation, but drowsy likely secondary to Ativan. Patient desires to go home today.   -d/c CIWA   Hypertension - BP in 200's on arrival. Unclear if pt compliant with home meds, improved after dialysis and initiation of home meds.  -Losartan 100 mg daily -Hydralazine 100 mg BID  ESRD on TTS HD: Nephrology following, plan for short HD today then outpatient dialysis tomorrow -HD per Nephrology, discharge after dialysis today   Seizure disorder - Pt with no reported seizure activity.  -f/u keppra level  -Continue home keppra 1000 mg daily and 500 mg TTS with HD    Dispo:  Anticipated discharge after dialysis today.    LOS: 1 day   Zada Finders, MD 06/20/2015, 11:03 AM

## 2015-06-21 LAB — COMMENT2 - HEP PANEL

## 2015-06-21 LAB — HEPATITIS C ANTIBODY (REFLEX)

## 2015-06-21 NOTE — Progress Notes (Signed)
Patient ate late dinner after hemodialysis.  Telemetry and PIV removed on prior shift.  Discharge instructions given on prior shift and all questions answered.  Patient given cab voucher for taxi ride to his home in [place], [place].  Taxi was called and patient was taken by wheelchair to the emergency room waiting room.  [employer] RN-BC, WTA.

## 2015-06-23 ENCOUNTER — Encounter: Payer: Self-pay | Admitting: Internal Medicine

## 2015-06-23 DIAGNOSIS — R768 Other specified abnormal immunological findings in serum: Secondary | ICD-10-CM | POA: Insufficient documentation

## 2015-06-23 LAB — LEVETIRACETAM LEVEL: LEVETIRACETAM: 33.5 ug/mL (ref 10.0–40.0)

## 2015-08-16 ENCOUNTER — Inpatient Hospital Stay (HOSPITAL_COMMUNITY)
Admission: EM | Admit: 2015-08-16 | Discharge: 2015-08-26 | DRG: 304 | Disposition: A | Discharge status: Skilled Nursing Facility | Payer: Medicare Other | Attending: Internal Medicine | Admitting: Internal Medicine

## 2015-08-16 ENCOUNTER — Encounter (HOSPITAL_COMMUNITY): Payer: Self-pay

## 2015-08-16 ENCOUNTER — Emergency Department (HOSPITAL_COMMUNITY): Payer: Medicare Other

## 2015-08-16 ENCOUNTER — Encounter (HOSPITAL_COMMUNITY)
Admission: EM | Disposition: A | Discharge status: Skilled Nursing Facility | Payer: Self-pay | Source: Home / Self Care | Attending: Internal Medicine

## 2015-08-16 DIAGNOSIS — IMO0001 Reserved for inherently not codable concepts without codable children: Secondary | ICD-10-CM | POA: Diagnosis present

## 2015-08-16 DIAGNOSIS — Z992 Dependence on renal dialysis: Secondary | ICD-10-CM

## 2015-08-16 DIAGNOSIS — E43 Unspecified severe protein-calorie malnutrition: Secondary | ICD-10-CM | POA: Diagnosis present

## 2015-08-16 DIAGNOSIS — Z66 Do not resuscitate: Secondary | ICD-10-CM | POA: Diagnosis not present

## 2015-08-16 DIAGNOSIS — F1410 Cocaine abuse, uncomplicated: Secondary | ICD-10-CM | POA: Diagnosis not present

## 2015-08-16 DIAGNOSIS — F17210 Nicotine dependence, cigarettes, uncomplicated: Secondary | ICD-10-CM | POA: Diagnosis present

## 2015-08-16 DIAGNOSIS — K5900 Constipation, unspecified: Secondary | ICD-10-CM | POA: Insufficient documentation

## 2015-08-16 DIAGNOSIS — Z8249 Family history of ischemic heart disease and other diseases of the circulatory system: Secondary | ICD-10-CM

## 2015-08-16 DIAGNOSIS — N186 End stage renal disease: Secondary | ICD-10-CM

## 2015-08-16 DIAGNOSIS — I10 Essential (primary) hypertension: Secondary | ICD-10-CM | POA: Diagnosis present

## 2015-08-16 DIAGNOSIS — N189 Chronic kidney disease, unspecified: Secondary | ICD-10-CM

## 2015-08-16 DIAGNOSIS — I161 Hypertensive emergency: Secondary | ICD-10-CM | POA: Diagnosis not present

## 2015-08-16 DIAGNOSIS — R9431 Abnormal electrocardiogram [ECG] [EKG]: Secondary | ICD-10-CM

## 2015-08-16 DIAGNOSIS — R079 Chest pain, unspecified: Secondary | ICD-10-CM

## 2015-08-16 DIAGNOSIS — Z681 Body mass index (BMI) 19 or less, adult: Secondary | ICD-10-CM

## 2015-08-16 DIAGNOSIS — G40909 Epilepsy, unspecified, not intractable, without status epilepticus: Secondary | ICD-10-CM

## 2015-08-16 DIAGNOSIS — F1020 Alcohol dependence, uncomplicated: Secondary | ICD-10-CM | POA: Diagnosis present

## 2015-08-16 DIAGNOSIS — D631 Anemia in chronic kidney disease: Secondary | ICD-10-CM | POA: Diagnosis present

## 2015-08-16 DIAGNOSIS — R5381 Other malaise: Secondary | ICD-10-CM | POA: Insufficient documentation

## 2015-08-16 DIAGNOSIS — N2581 Secondary hyperparathyroidism of renal origin: Secondary | ICD-10-CM | POA: Diagnosis present

## 2015-08-16 DIAGNOSIS — I5032 Chronic diastolic (congestive) heart failure: Secondary | ICD-10-CM | POA: Diagnosis present

## 2015-08-16 DIAGNOSIS — R52 Pain, unspecified: Secondary | ICD-10-CM

## 2015-08-16 DIAGNOSIS — I132 Hypertensive heart and chronic kidney disease with heart failure and with stage 5 chronic kidney disease, or end stage renal disease: Secondary | ICD-10-CM | POA: Diagnosis present

## 2015-08-16 DIAGNOSIS — N185 Chronic kidney disease, stage 5: Secondary | ICD-10-CM | POA: Diagnosis not present

## 2015-08-16 DIAGNOSIS — I214 Non-ST elevation (NSTEMI) myocardial infarction: Secondary | ICD-10-CM | POA: Diagnosis not present

## 2015-08-16 DIAGNOSIS — Z720 Tobacco use: Secondary | ICD-10-CM | POA: Diagnosis present

## 2015-08-16 DIAGNOSIS — I248 Other forms of acute ischemic heart disease: Secondary | ICD-10-CM | POA: Diagnosis present

## 2015-08-16 DIAGNOSIS — Z9119 Patient's noncompliance with other medical treatment and regimen: Secondary | ICD-10-CM

## 2015-08-16 DIAGNOSIS — I150 Renovascular hypertension: Secondary | ICD-10-CM

## 2015-08-16 DIAGNOSIS — Z841 Family history of disorders of kidney and ureter: Secondary | ICD-10-CM

## 2015-08-16 DIAGNOSIS — Z823 Family history of stroke: Secondary | ICD-10-CM

## 2015-08-16 LAB — LIPID PANEL
Cholesterol: 165 mg/dL (ref 0–200)
HDL: 49 mg/dL (ref >40)
LDL CALC: 98 mg/dL (ref 0–99)
Total CHOL/HDL Ratio: 3.4 RATIO
Triglycerides: 90 mg/dL (ref <150)
VLDL: 18 mg/dL (ref 0–40)

## 2015-08-16 LAB — COMPREHENSIVE METABOLIC PANEL
ALK PHOS: 65 U/L (ref 38–126)
ALT: 8 U/L — ABNORMAL LOW (ref 17–63)
AST: 17 U/L (ref 15–41)
Albumin: 2.9 g/dL — ABNORMAL LOW (ref 3.5–5.0)
Anion gap: 11 (ref 5–15)
BILIRUBIN TOTAL: 0.6 mg/dL (ref 0.3–1.2)
BUN: 11 mg/dL (ref 6–20)
CALCIUM: 8.2 mg/dL — AB (ref 8.9–10.3)
CO2: 25 mmol/L (ref 22–32)
Chloride: 96 mmol/L — ABNORMAL LOW (ref 101–111)
Creatinine, Ser: 6.02 mg/dL — ABNORMAL HIGH (ref 0.61–1.24)
GFR calc Af Amer: 11 mL/min — ABNORMAL LOW (ref >60)
GFR, EST NON AFRICAN AMERICAN: 9 mL/min — AB (ref >60)
Glucose, Bld: 92 mg/dL (ref 65–99)
POTASSIUM: 4 mmol/L (ref 3.5–5.1)
Sodium: 132 mmol/L — ABNORMAL LOW (ref 135–145)
TOTAL PROTEIN: 7.2 g/dL (ref 6.5–8.1)

## 2015-08-16 LAB — DIFFERENTIAL
Basophils Absolute: 0 10*3/uL (ref 0.0–0.1)
Basophils Relative: 1 %
EOS ABS: 0.2 10*3/uL (ref 0.0–0.7)
EOS PCT: 5 %
LYMPHS ABS: 0.9 10*3/uL (ref 0.7–4.0)
Lymphocytes Relative: 22 %
MONO ABS: 0.5 10*3/uL (ref 0.1–1.0)
Monocytes Relative: 12 %
NEUTROS PCT: 60 %
Neutro Abs: 2.4 10*3/uL (ref 1.7–7.7)

## 2015-08-16 LAB — CBC
HCT: 38.8 % — ABNORMAL LOW (ref 39.0–52.0)
Hemoglobin: 12.2 g/dL — ABNORMAL LOW (ref 13.0–17.0)
MCH: 26.9 pg (ref 26.0–34.0)
MCHC: 31.4 g/dL (ref 30.0–36.0)
MCV: 85.7 fL (ref 78.0–100.0)
PLATELETS: 162 10*3/uL (ref 150–400)
RBC: 4.53 MIL/uL (ref 4.22–5.81)
RDW: 15.6 % — ABNORMAL HIGH (ref 11.5–15.5)
WBC: 4 10*3/uL (ref 4.0–10.5)

## 2015-08-16 LAB — APTT: aPTT: 32 seconds (ref 24–37)

## 2015-08-16 LAB — TROPONIN I: TROPONIN I: 0.13 ng/mL — AB (ref <0.031)

## 2015-08-16 LAB — PROTIME-INR
INR: 0.96 (ref 0.00–1.49)
PROTHROMBIN TIME: 13 s (ref 11.6–15.2)

## 2015-08-16 SURGERY — LEFT HEART CATH AND CORONARY ANGIOGRAPHY
Anesthesia: LOCAL

## 2015-08-16 MED ORDER — CALCIUM ACETATE (PHOS BINDER) 667 MG PO CAPS
2001.0000 mg | ORAL_CAPSULE | Freq: Three times a day (TID) | ORAL | Status: DC
  Administered 2015-08-17 – 2015-08-20 (×11): 2001 mg via ORAL
  Filled 2015-08-16 (×13): qty 3

## 2015-08-16 MED ORDER — LORAZEPAM 2 MG/ML IJ SOLN: 2.0000 mg | INTRAMUSCULAR | Status: DC | PRN

## 2015-08-16 MED ORDER — ONDANSETRON HCL 4 MG/2ML IJ SOLN: 4.0000 mg | Freq: Four times a day (QID) | INTRAMUSCULAR | Status: DC | PRN

## 2015-08-16 MED ORDER — ALPRAZOLAM 0.25 MG PO TABS
0.2500 mg | ORAL_TABLET | Freq: Two times a day (BID) | ORAL | Status: DC | PRN
  Administered 2015-08-16 – 2015-08-17 (×2): 0.25 mg via ORAL
  Filled 2015-08-16 (×2): qty 1

## 2015-08-16 MED ORDER — FOLIC ACID 5 MG/ML IJ SOLN
1.0000 mg | Freq: Every day | INTRAMUSCULAR | Status: DC
  Filled 2015-08-16: qty 0.2

## 2015-08-16 MED ORDER — LOSARTAN POTASSIUM 50 MG PO TABS
100.0000 mg | ORAL_TABLET | Freq: Every evening | ORAL | Status: DC
  Administered 2015-08-16 – 2015-08-26 (×9): 100 mg via ORAL
  Filled 2015-08-16 (×11): qty 2

## 2015-08-16 MED ORDER — HEPARIN SODIUM (PORCINE) 1000 UNIT/ML IJ SOLN
4000.0000 [IU] | Freq: Once | INTRAMUSCULAR | Status: DC
  Filled 2015-08-16: qty 4

## 2015-08-16 MED ORDER — ATORVASTATIN CALCIUM 40 MG PO TABS
40.0000 mg | ORAL_TABLET | Freq: Every day | ORAL | Status: DC
  Administered 2015-08-16 – 2015-08-26 (×10): 40 mg via ORAL
  Filled 2015-08-16 (×11): qty 1

## 2015-08-16 MED ORDER — VERAPAMIL HCL 80 MG PO TABS
80.0000 mg | ORAL_TABLET | Freq: Three times a day (TID) | ORAL | Status: DC
  Administered 2015-08-16 – 2015-08-18 (×6): 80 mg via ORAL
  Filled 2015-08-16 (×7): qty 1

## 2015-08-16 MED ORDER — ACETAMINOPHEN 325 MG PO TABS
650.0000 mg | ORAL_TABLET | ORAL | Status: DC | PRN
  Administered 2015-08-22 – 2015-08-24 (×2): 650 mg via ORAL
  Filled 2015-08-16 (×2): qty 2

## 2015-08-16 MED ORDER — HYDRALAZINE HCL 50 MG PO TABS
100.0000 mg | ORAL_TABLET | Freq: Two times a day (BID) | ORAL | Status: DC
  Administered 2015-08-16 – 2015-08-17 (×2): 100 mg via ORAL
  Filled 2015-08-16 (×2): qty 2

## 2015-08-16 MED ORDER — SODIUM CHLORIDE 0.9 % IV SOLN: 250.0000 mL | INTRAVENOUS | Status: DC | PRN

## 2015-08-16 MED ORDER — ADULT MULTIVITAMIN W/MINERALS CH
1.0000 | ORAL_TABLET | Freq: Every day | ORAL | Status: DC
  Administered 2015-08-17 – 2015-08-26 (×9): 1 via ORAL
  Filled 2015-08-16 (×11): qty 1

## 2015-08-16 MED ORDER — PANTOPRAZOLE SODIUM 40 MG IV SOLR
40.0000 mg | INTRAVENOUS | Status: DC
  Administered 2015-08-16 – 2015-08-17 (×2): 40 mg via INTRAVENOUS
  Filled 2015-08-16 (×2): qty 40

## 2015-08-16 MED ORDER — THIAMINE HCL 100 MG/ML IJ SOLN
100.0000 mg | Freq: Every day | INTRAMUSCULAR | Status: DC
  Filled 2015-08-16: qty 2

## 2015-08-16 MED ORDER — SODIUM CHLORIDE 0.9% FLUSH
3.0000 mL | Freq: Two times a day (BID) | INTRAVENOUS | Status: DC
  Administered 2015-08-16 – 2015-08-18 (×4): 3 mL via INTRAVENOUS

## 2015-08-16 MED ORDER — NITROGLYCERIN IN D5W 200-5 MCG/ML-% IV SOLN
3.0000 ug/min | INTRAVENOUS | Status: DC
  Administered 2015-08-16: 30 ug/min via INTRAVENOUS

## 2015-08-16 MED ORDER — HEPARIN SODIUM (PORCINE) 5000 UNIT/ML IJ SOLN: 60.0000 [IU]/kg | Freq: Once | INTRAMUSCULAR | Status: DC

## 2015-08-16 MED ORDER — SODIUM CHLORIDE 0.9% FLUSH: 3.0000 mL | INTRAVENOUS | Status: DC | PRN

## 2015-08-16 MED ORDER — HEPARIN SODIUM (PORCINE) 5000 UNIT/ML IJ SOLN: 4000.0000 [IU] | Freq: Once | INTRAMUSCULAR | Status: DC

## 2015-08-16 MED ORDER — LABETALOL HCL 5 MG/ML IV SOLN
20.0000 mg | Freq: Once | INTRAVENOUS | Status: AC
  Administered 2015-08-16: 20 mg via INTRAVENOUS
  Filled 2015-08-16: qty 4

## 2015-08-16 MED ORDER — LEVETIRACETAM 500 MG PO TABS
1000.0000 mg | ORAL_TABLET | Freq: Every day | ORAL | Status: DC
  Administered 2015-08-17 – 2015-08-26 (×9): 1000 mg via ORAL
  Filled 2015-08-16 (×10): qty 2

## 2015-08-16 MED ORDER — ASPIRIN 81 MG PO CHEW: 324.0000 mg | CHEWABLE_TABLET | Freq: Once | ORAL | Status: DC

## 2015-08-16 MED ORDER — NITROGLYCERIN IN D5W 200-5 MCG/ML-% IV SOLN
10.0000 ug/min | INTRAVENOUS | Status: DC
  Administered 2015-08-16: 10 ug/min via INTRAVENOUS
  Filled 2015-08-16: qty 250

## 2015-08-16 MED ORDER — ASPIRIN EC 81 MG PO TBEC
81.0000 mg | DELAYED_RELEASE_TABLET | Freq: Every day | ORAL | Status: DC
  Administered 2015-08-17 – 2015-08-26 (×9): 81 mg via ORAL
  Filled 2015-08-16 (×10): qty 1

## 2015-08-16 MED ORDER — LABETALOL HCL 5 MG/ML IV SOLN: 20.0000 mg | Freq: Once | INTRAVENOUS | Status: DC

## 2015-08-16 MED ORDER — HEPARIN SODIUM (PORCINE) 5000 UNIT/ML IJ SOLN
5000.0000 [IU] | Freq: Three times a day (TID) | INTRAMUSCULAR | Status: DC
  Administered 2015-08-18 – 2015-08-24 (×14): 5000 [IU] via SUBCUTANEOUS
  Filled 2015-08-16 (×17): qty 1

## 2015-08-16 NOTE — ED Notes (Signed)
[facility] paged out code stemi @ [id number]

## 2015-08-16 NOTE — ED Notes (Signed)
EDP advised to hold Labetalol at this time.

## 2015-08-16 NOTE — ED Notes (Signed)
Dr. Susy Manor at bedside.

## 2015-08-16 NOTE — ED Notes (Signed)
MD advised of patients BP verbal labetalol 20mg  IVP.

## 2015-08-16 NOTE — ED Notes (Signed)
Cardiology Fellow paged to Dr. Reather Converse, MD.

## 2015-08-16 NOTE — ED Notes (Signed)
PER EMS: pt from Dialysis; pt reports 5 day crack cocaine binge. At dialysis he started to have left sided CP there. EMS arrived, CODE STEMI activated. Signifcant depression to V2, V3, AVF, V5, V6. 324 asa given en route an 2 SL nitro tabs. Pt CP free upon arrival. BP- 186/96, HR-86, 100% 4L [place], CBG-88

## 2015-08-16 NOTE — ED Provider Notes (Addendum)
CSN: [id number]     Arrival date & time 08/16/15  [id number] History   First MD Initiated Contact with Patient 08/16/15 1823     Chief Complaint  Patient presents with  . Code STEMI     (Consider location/radiation/quality/duration/timing/severity/associated sxs/prior Treatment) HPI Comments: 60 year old male with history of high blood pressure, end-stage renal disease on dialysis, last dialyzed earlier today, seizure disorder, anemia, malnutrition, LVH, prolonged QT, cocaine abuse presents as code STEMI from the field.  Patient had brief sharp chest pain lasting partially one minute 2 episodes today. The similar to episodes is had before. No vomiting or diaphoresis with it.  The history is provided by the patient.    Past Medical History  Diagnosis Date  . Hypertension   . Anemia, chronic disease     a. Capsule endoscopy reportedly negative 08/2013. b. seen by heme with concern for minor B cell population on BMB, being observed.  . Chronic diastolic CHF (congestive heart failure) ([place])     a. EF 60 - 65% per [place] echo 11/2011. b. Echo 02/2012: severe LVH, focal basal hypertrophy, EF 60-65%, mild Mr.  . Cocaine abuse     mentioned in notes from [place]  . Hepatitis C antibody test positive     was HIV negative, 02/28/12  . Hepatitis B core antibody positive     03/01/10  . Positive QuantiFERON-TB Gold test     11/2011  . Helicobacter pylori gastritis     not defined if this was treated  . Polyp of colon, adenomatous     May 2012.  Dr Trenton Founds in [place]  . Hematochezia   . Head injury, closed, with concussion ([place])   . History of blood transfusion   . Arthritis     "right shoulder" (11/09/2012)  . Seizure disorder [facility])     questionable history of - will need to clarify with PCP  . Headache(784.0)   . Hypercalcemia   . Hyperpotassemia   . Tobacco abuse   . Protein-calorie malnutrition, severe ([place])   . Optic neuritis, left   . Hypertensive heart disease   . GI bleed  05/04/2012  . ESRD on hemodialysis ([place]) since 2012    a. Since 2012. ESRD was due to "drugs", primarily used cocaine.  Has 3-5 year hx of HTN, no DM.  Gets HD on TTS schedule at [place]. Originally from [place].   Marland Kitchen ESRD (end stage renal disease) on dialysis [facility])     "TTS; [address]" (06/19/2015)   Past Surgical History  Procedure Laterality Date  . Shoulder open rotator cuff repair Right   . Total knee arthroplasty Left   . Bascilic vein transposition  03/07/2012    Procedure: BASCILIC VEIN TRANSPOSITION;  Surgeon: Conrad Sellersburg, MD;  Location: [place];  Service: Vascular;  Laterality: Left;  First Stage  . Insertion of dialysis catheter      right chest  . Bascilic vein transposition Left 05/31/2012    Procedure: BASCILIC VEIN TRANSPOSITION;  Surgeon: Conrad Keokea, MD;  Location: [place];  Service: Vascular;  Laterality: Left;  Left 2nd Stage Basilic Vein Transposition with gortex graft revision using 16mmx10cm graft  . Shoulder open rotator cuff repair Right   . [name] capsule study N/A 08/27/2013    Procedure: [name] CAPSULE STUDY;  Surgeon: Milus Banister, MD;  Location: [place];  Service: Endoscopy;  Laterality: N/A;  . Joint replacement     Family History  Problem Relation Age of  Onset  . Diabetes Mother   . Hypertension Mother   . Stroke Mother   . Kidney failure Mother   . Cancer Father    Social History  Substance Use Topics  . Smoking status: Current Every Day Smoker -- 1.50 packs/day for 45 years    Types: Cigarettes  . Smokeless tobacco: Never Used  . Alcohol Use: 14.4 oz/week    24 Cans of beer per week     Comment: 1 40's per day    Review of Systems  Constitutional: Negative for fever and chills.  HENT: Negative for congestion.   Eyes: Negative for visual disturbance.  Respiratory: Negative for shortness of breath.   Cardiovascular: Positive for chest pain.  Gastrointestinal: Negative for vomiting and abdominal pain.  Genitourinary: Negative for  dysuria and flank pain.  Musculoskeletal: Negative for back pain, neck pain and neck stiffness.  Skin: Negative for rash.  Neurological: Negative for light-headedness and headaches.      Allergies  Reglan  Home Medications   Prior to Admission medications   Medication Sig Start Date End Date Taking? Authorizing Provider  calcium acetate (PHOSLO) 667 MG capsule Take 2,001 mg by mouth 3 (three) times daily with meals.     Historical Provider, MD  Darbepoetin Alfa (ARANESP) 200 MCG/0.4ML SOSY injection Inject 0.4 mLs (200 mcg total) into the vein every Tuesday with hemodialysis. Patient not taking: Reported on 06/19/2015 04/09/14   Jones Bales, MD  doxercalciferol (HECTOROL) 4 MCG/2ML injection Inject 1.5 mLs (3 mcg total) into the vein Every Tuesday,Thursday,and Saturday with dialysis. Patient not taking: Reported on 06/19/2015 04/07/14   Jones Bales, MD  hydrALAZINE (APRESOLINE) 100 MG tablet Take 100 mg by mouth 2 (two) times daily. DON'T TAKE ON DIALYSIS DAYS    Historical Provider, MD  levETIRAcetam (KEPPRA) 1000 MG tablet Take 1 tablet (1,000 mg total) by mouth daily. 04/25/14   Luan Moore, MD  levETIRAcetam (KEPPRA) 500 MG tablet Take 1 tablet (500 mg total) by mouth Every Tuesday,Thursday,and Saturday with dialysis. 04/25/14   Luan Moore, MD  losartan (COZAAR) 100 MG tablet Take 100 mg by mouth every evening. 10/02/14   Historical Provider, MD  multivitamin (RENA-VIT) TABS tablet Take 1 tablet by mouth at bedtime. 04/25/14   Luan Moore, MD  Nutritional Supplements (FEEDING SUPPLEMENT, NEPRO CARB STEADY,) LIQD Take 237 mLs by mouth 2 (two) times daily between meals. 04/07/14   Jones Bales, MD   BP 232/100 mmHg  Pulse 86  Temp(Src) 98.7 F (37.1 C) (Oral)  Resp 24  SpO2 100% Physical Exam  Constitutional: [name] oriented to person, place, and time. He appears well-developed and well-nourished.  HENT:  Head: Normocephalic and atraumatic.  Eyes: Right eye exhibits no  discharge. Left eye exhibits no discharge.  Neck: Normal range of motion. Neck supple. No tracheal deviation present.  Cardiovascular: Normal rate and regular rhythm.   Pulmonary/Chest: Effort normal and breath sounds normal.  Abdominal: Soft. He exhibits no distension. There is no tenderness. There is no guarding.  Musculoskeletal: He exhibits no edema.  Neurological: [name] alert and oriented to person, place, and time.  Skin: Skin is warm. No rash noted.  Psychiatric: He has a normal mood and affect.  Nursing note and vitals reviewed.   ED Course  Procedures (including critical care time) CRITICAL CARE Performed by: Mariea Clonts   Total critical care time: 75 minutes  Critical care time was exclusive of separately billable procedures and treating other patients.  Critical care was necessary to treat or prevent imminent or life-threatening deterioration.  Critical care was time spent personally by me on the following activities: development of treatment plan with patient and/or surrogate as well as nursing, discussions with consultants, evaluation of patient's response to treatment, examination of patient, obtaining history from patient or surrogate, ordering and performing treatments and interventions, ordering and review of laboratory studies, ordering and review of radiographic studies, pulse oximetry and re-evaluation of patient's condition.  Labs Review Labs Reviewed  CBC - Abnormal; Notable for the following:    Hemoglobin 12.2 (*)    HCT 38.8 (*)    RDW 15.6 (*)    All other components within normal limits  COMPREHENSIVE METABOLIC PANEL - Abnormal; Notable for the following:    Sodium 132 (*)    Chloride 96 (*)    Creatinine, Ser 6.02 (*)    Calcium 8.2 (*)    Albumin 2.9 (*)    ALT 8 (*)    GFR calc non Af Amer 9 (*)    GFR calc Af Amer 11 (*)    All other components within normal limits  TROPONIN I - Abnormal; Notable for the following:    Troponin I 0.13 (*)     All other components within normal limits  DIFFERENTIAL  PROTIME-INR  APTT  LIPID PANEL  I-STAT TROPOININ, ED    Imaging Review Dg Chest Portable 1 View  08/16/2015  CLINICAL DATA:  Patient from dialysis.  Five day crack cocaine binge EXAM: PORTABLE CHEST 1 VIEW COMPARISON:  3/2/ 17 FINDINGS: Mild cardiac enlargement. No pleural effusion or edema. No airspace consolidation. Degenerative changes are noted within both glenohumeral joints. IMPRESSION: 1. No acute cardiopulmonary abnormalities. Electronically Signed   By: Kerby Moors M.D.   On: 08/16/2015 19:04   I have personally reviewed and evaluated these images and lab results as part of my medical decision-making.   EKG Interpretation   Date/Time:  Saturday August 16 2015 18:26:40 EDT Ventricular Rate:  89 PR Interval:  169 QRS Duration: 104 QT Interval:  385 QTC Calculation: 468 R Axis:   75 Text Interpretation:  Sinus rhythm Left atrial enlargement RSR' in V1 or  V2, probably normal variant LVH with secondary repolarization abnormality  T wave abnormalities inferior and lateral Confirmed by Kawanna Christley  MD, Alecsander Hattabaugh  ([id number]) on 08/16/2015 6:32:47 PM      MDM   Final diagnoses:  EKG, abnormal  NSTEMI (non-ST elevated myocardial infarction) [facility])  Cocaine abuse  Hypertensive emergency   Patient with renal disease and drug abuse history presents as possible ST elevation heart attack. Patient had EKG and arrival showing significant changes T-wave inversions inferiorly and laterally. Also had LVH component. Heparin ordered. Patient had aspirin and is pain-free currently. Cardiology evaluated recommends medical management. Blood pressure IV meds ordered, drip ordered. Plan for stepdown admission  Patient's blood pressure continued to increase despite transient improvements. Nitro drip, repeat labetalol. Discussed with both internal medicine and critical care for admission to either stepdown or ICU. Patient chest pain-free.  The  patients results and plan were reviewed and discussed.   Any x-rays performed were independently reviewed by myself.   Differential diagnosis were considered with the presenting HPI.  Medications  aspirin chewable tablet 324 mg (324 mg Oral Not Given 08/16/15 1834)  nitroGLYCERIN 50 mg in dextrose 5 % 250 mL (0.2 mg/mL) infusion (10 mcg/min Intravenous New Bag/Given 08/16/15 2014)  labetalol (NORMODYNE,TRANDATE) injection 20 mg (20 mg Intravenous Given 08/16/15 1951)  Filed Vitals:   08/16/15 1940 08/16/15 1945 08/16/15 2000 08/16/15 2015  BP: 232/100 236/149 190/78 208/82  Pulse: 86 88 76 74  Temp:      TempSrc:      Resp: 24 17 18 20   SpO2: 100% 100% 100% 100%    Final diagnoses:  EKG, abnormal  NSTEMI (non-ST elevated myocardial infarction) ([place])  Cocaine abuse  Hypertensive emergency    Admission/ observation were discussed with the admitting physician, patient and/or family and they are comfortable with the plan.    Elnora Morrison, MD 08/16/15 [id number]  Elnora Morrison, MD 08/16/15 2053

## 2015-08-16 NOTE — ED Notes (Signed)
EDP advised goal for BP [id number] Systolic

## 2015-08-16 NOTE — H&P (Signed)
History and Physical    [name] [id number] DOB: [birth year] DOA: 08/16/2015  Referring Provider: Dr. Reather Converse ([place])  PCP: No primary care provider on file.  Outpatient Specialists: Dr. Juliann Mule (oncology), Dr. Henrene Pastor (GI)   Patient coming from: Home  Chief Complaint: Chest pain, generalized weakness   HPI: [name] is a 60 y.o. male with medical history significant for end-stage renal disease on hemodialysis, cocaine abuse, alcohol abuse, and GI bleed who presents to the ED from his dialysis center with severe acute hypertension, chest pain, and generalized weakness. Patient reports that he has been using crack cocaine daily for the past 5-7 days, but is otherwise been in his usual state of health. He went to his dialysis center today for his regularly scheduled session and completed it without incident. Upon completion, however, he was noted to be hypertensive to the 230s over 140s and with chest pain complaints. He was directed to the emergency department for evaluation of this. Patient denies any recent fevers, chills, abdominal pain, or diarrhea. He denies headache, focal numbness or weakness, or loss of coordination. He does endorse chest pain which she describes as severe, constant, achy, localized to the left anterior chest, and with no relieving or exacerbating factors identified. He was given a 324 mg aspirin and nitroglycerin 2 in route to the hospital with EMS. There were deep T-wave inversions diffusely on EKG performed with EMS and patient was brought in under code STEMI.  ED Course: Upon arrival to the ED, patient is found to be afebrile, saturating well on 2 L/m, and with blood pressure as high as 236/149. EKG reveals a sinus rhythm with LVH by voltage criteria and T-wave inversions in the inferolateral leads. Initial troponin is 0.13. CMP features a mild hyponatremia and hypochloremia and CBC features a mild normocytic anemia which is consistent with his baseline. 20 mg  labetalol IV push was administered in the ED and nitroglycerin drip was initiated. Cardiology was consulted by the EDP and evaluated the patient in the emergency department. Per cardiology, there is no plans for urgent intervention. Cardiology consultant recommends avoiding beta blockers, starting verapamil 80 mg 3 times a day, and continuing the patient's home antihypertensives of losartan and hydralazine. Admission to the medicine service was advised. EDP was asked to consult critical care for evaluation as the patient's BP persisted in the [id number] systolic on the nitroglycerin drip and there was persistent chest pain. Pain resolved however, and blood pressure improved dramatically. Patient will be admitted to the stepdown unit for ongoing evaluation and management of an apparent hypertensive emergency.  Review of Systems:  All other systems reviewed and apart from HPI, are negative.  Past Medical History  Diagnosis Date  . Hypertension   . Anemia, chronic disease     a. Capsule endoscopy reportedly negative 08/2013. b. seen by heme with concern for minor B cell population on BMB, being observed.  . Chronic diastolic CHF (congestive heart failure) ([place])     a. EF 60 - 65% per [place] echo 11/2011. b. Echo 02/2012: severe LVH, focal basal hypertrophy, EF 60-65%, mild Mr.  . Cocaine abuse     mentioned in notes from [place]  . Hepatitis C antibody test positive     was HIV negative, 02/28/12  . Hepatitis B core antibody positive     03/01/10  . Positive QuantiFERON-TB Gold test     11/2011  . Helicobacter pylori gastritis     not defined if this was treated  . Polyp  of colon, adenomatous     May 2012.  Dr Trenton Founds in [place]  . Hematochezia   . Head injury, closed, with concussion ([place])   . History of blood transfusion   . Arthritis     "right shoulder" (11/09/2012)  . Seizure disorder [facility])     questionable history of - will need to clarify with PCP  . Headache(784.0)   .  Hypercalcemia   . Hyperpotassemia   . Tobacco abuse   . Protein-calorie malnutrition, severe ([place])   . Optic neuritis, left   . Hypertensive heart disease   . GI bleed 05/04/2012  . ESRD on hemodialysis ([place]) since 2012    a. Since 2012. ESRD was due to "drugs", primarily used cocaine.  Has 3-5 year hx of HTN, no DM.  Gets HD on TTS schedule at [place]. Originally from [place].   Marland Kitchen ESRD (end stage renal disease) on dialysis [facility])     "TTS; [address]" (06/19/2015)    Past Surgical History  Procedure Laterality Date  . Shoulder open rotator cuff repair Right   . Total knee arthroplasty Left   . Bascilic vein transposition  03/07/2012    Procedure: BASCILIC VEIN TRANSPOSITION;  Surgeon: Conrad Pettit, MD;  Location: [place];  Service: Vascular;  Laterality: Left;  First Stage  . Insertion of dialysis catheter      right chest  . Bascilic vein transposition Left 05/31/2012    Procedure: BASCILIC VEIN TRANSPOSITION;  Surgeon: Conrad Cope, MD;  Location: [place];  Service: Vascular;  Laterality: Left;  Left 2nd Stage Basilic Vein Transposition with gortex graft revision using 84mmx10cm graft  . Shoulder open rotator cuff repair Right   . [name] capsule study N/A 08/27/2013    Procedure: [name] CAPSULE STUDY;  Surgeon: Milus Banister, MD;  Location: [place];  Service: Endoscopy;  Laterality: N/A;  . Joint replacement       reports that he has been smoking Cigarettes.  He has a 67.5 pack-year smoking history. He has never used smokeless tobacco. He reports that he drinks about 14.4 oz of alcohol per week. He reports that he uses illicit drugs ("Crack" cocaine, Cocaine, and Marijuana).  Allergies  Allergen Reactions  . Reglan [Metoclopramide] Other (See Comments)    Tardive dyskinesia in 11/2011 in [place]    Family History  Problem Relation Age of Onset  . Diabetes Mother   . Hypertension Mother   . Stroke Mother   . Kidney failure Mother   . Cancer Father      Prior to  Admission medications   Medication Sig Start Date End Date Taking? Authorizing Provider  calcium acetate (PHOSLO) 667 MG capsule Take 2,001 mg by mouth 3 (three) times daily with meals.    Yes Historical Provider, MD  doxercalciferol (HECTOROL) 4 MCG/2ML injection Inject 1.5 mLs (3 mcg total) into the vein Every Tuesday,Thursday,and Saturday with dialysis. 04/07/14  Yes Jones Bales, MD  hydrALAZINE (APRESOLINE) 100 MG tablet Take 100 mg by mouth 2 (two) times daily. DON'T TAKE ON DIALYSIS DAYS   Yes Historical Provider, MD  levETIRAcetam (KEPPRA) 1000 MG tablet Take 1 tablet (1,000 mg total) by mouth daily. 04/25/14  Yes Luan Moore, MD  losartan (COZAAR) 100 MG tablet Take 100 mg by mouth every evening. 10/02/14  Yes Historical Provider, MD    Physical Exam: Filed Vitals:   08/16/15 2055 08/16/15 2100 08/16/15 2115 08/16/15 2130  BP: 187/81 187/85 175/82 188/80  Pulse:  79 78 78 79  Temp:      TempSrc:      Resp: 24 16 15 19   SpO2: 100% 100% 100% 100%      Constitutional: NAD, calm, comfortable. Very thin with bitemporal wasting Eyes: PERTLA, lids and conjunctivae normal ENMT: Mucous membranes are moist. Posterior pharynx clear of any exudate or lesions.   Neck: normal, supple, no masses, no thyromegaly Respiratory: clear to auscultation bilaterally, no wheezing, no crackles. Normal respiratory effort.   Cardiovascular: S1 & S2 heard, regular rate and rhythm, grade III systolic murmur at apex, no rubs / gallops. No extremity edema.  Abdomen: No distension, no tenderness, no masses palpated. No hepatosplenomegaly. Bowel sounds normal.  Musculoskeletal: no clubbing / cyanosis. No joint deformity upper and lower extremities. Normal muscle tone.  Skin: no rashes, lesions, ulcers. No induration Neurologic: CN 2-12 grossly intact. Sensation intact, DTR normal. Strength 5/5 in all 4 limbs.  Psychiatric: Normal judgment and insight. Alert and oriented x 3. Normal mood.     Labs on  Admission: I have personally reviewed following labs and imaging studies  CBC:  Recent Labs Lab 08/16/15 1830  WBC 4.0  NEUTROABS 2.4  HGB 12.2*  HCT 38.8*  MCV 85.7  PLT 0000000   Basic Metabolic Panel:  Recent Labs Lab 08/16/15 1830  NA 132*  K 4.0  CL 96*  CO2 25  GLUCOSE 92  BUN 11  CREATININE 6.02*  CALCIUM 8.2*   GFR: CrCl cannot be calculated (Unknown ideal weight.). Liver Function Tests:  Recent Labs Lab 08/16/15 1830  AST 17  ALT 8*  ALKPHOS 65  BILITOT 0.6  PROT 7.2  ALBUMIN 2.9*   No results for input(s): LIPASE, AMYLASE in the last 168 hours. No results for input(s): AMMONIA in the last 168 hours. Coagulation Profile:  Recent Labs Lab 08/16/15 1830  INR 0.96   Cardiac Enzymes:  Recent Labs Lab 08/16/15 1830  TROPONINI 0.13*   BNP (last 3 results) No results for input(s): PROBNP in the last 8760 hours. HbA1C: No results for input(s): HGBA1C in the last 72 hours. CBG: No results for input(s): GLUCAP in the last 168 hours. Lipid Profile:  Recent Labs  08/16/15 1835  CHOL 165  HDL 49  LDLCALC 98  TRIG 90  CHOLHDL 3.4   Thyroid Function Tests: No results for input(s): TSH, T4TOTAL, FREET4, T3FREE, THYROIDAB in the last 72 hours. Anemia Panel: No results for input(s): VITAMINB12, FOLATE, FERRITIN, TIBC, IRON, RETICCTPCT in the last 72 hours. Urine analysis:    Component Value Date/Time   COLORURINE YELLOW 11/07/2012 1903   APPEARANCEUR CLEAR 11/07/2012 1903   LABSPEC 1.012 11/07/2012 1903   PHURINE 8.5* 11/07/2012 1903   GLUCOSEU 250* 11/07/2012 1903   HGBUR NEGATIVE 11/07/2012 1903   BILIRUBINUR NEGATIVE 11/07/2012 1903   KETONESUR NEGATIVE 11/07/2012 1903   PROTEINUR >300* 11/07/2012 1903   UROBILINOGEN 0.2 11/07/2012 1903   NITRITE NEGATIVE 11/07/2012 1903   LEUKOCYTESUR NEGATIVE 11/07/2012 1903   Sepsis Labs: @LABRCNTIP (procalcitonin:4,lacticidven:4) )No results found for this or any previous visit (from the past  240 hour(s)).   Radiological Exams on Admission: Dg Chest Portable 1 View  08/16/2015  CLINICAL DATA:  Patient from dialysis.  Five day crack cocaine binge EXAM: PORTABLE CHEST 1 VIEW COMPARISON:  3/2/ 17 FINDINGS: Mild cardiac enlargement. No pleural effusion or edema. No airspace consolidation. Degenerative changes are noted within both glenohumeral joints. IMPRESSION: 1. No acute cardiopulmonary abnormalities. Electronically Signed   By: Kerby Moors  M.D.   On: 08/16/2015 19:04    EKG: Independently reviewed. Sinus rhythm, LVH, T-wave inversions anterolateral leads   Assessment/Plan  1. Chest pain   - Cardiology consulting and much appreciated  - EKG with deep T-wave inversions in anterolateral leads  - Initial troponin 0.13  - Given ASA 324, placed on NTG infusion  - CP has resolved with NTG infusion  - Avoiding beta-blockers in setting of recent cocaine use  - Trend serial troponin measurements overnight for 2 more  - Monitor on telemetry for ischemic changes   2. Hypertensive emergency  - Appreciate cardiology assistance  - BP 230/150 range in ED; improved with NTG infusion  - Continue home-dose losartan and hydralazine  - Avoid beta-blocker in setting of recent cocaine abuse  - Start verapamil 80 mg q8h per cardiology recs  - Monitor on telemetry, evaluate for ACS as above    3. ESRD - Underwent full HD treatment without incident on 08/16/15  - No indications for urgent HD now  - SLIV, fluid-restricted renal diet when appropriate    4. Polysubstance abuse - Pt endorses recent binge on crack cocaine, using every day for past wk  - Pt reports significant and ongoing daily alcohol use and has hx of withdrawal seizure  - Counseled toward cessation  - Monitor on CIWA with prn Ativan   5. Seizure disorder  - Continue current-dose Keppra  - Monitoring on CIWA with prn Ativan as above   6. Anemia of CKD  - Hgb is 12.2 on admission, stable relative to priors  - No sign of  active blood loss     DVT prophylaxis: sq heparin  Code Status: Full   Family Communication: None available   Disposition Plan: Admit to stepdown Consults called: Cardiology, PCCM  Admission status: Observation    Vianne Bulls MD [facility] Pager [phone]  If 7PM-7AM, please contact night-coverage [email or web address] Password [email or web address]  08/16/2015, 10:20 PM

## 2015-08-16 NOTE — ED Notes (Signed)
Pt placed on ZOLL upon arrival to room.

## 2015-08-16 NOTE — ED Notes (Signed)
[name] 2H will take.

## 2015-08-16 NOTE — Consult Note (Signed)
Referring Physician: Dr. Reather Converse Reason for Consultation: CP, abnormal ECG   HPI: 60 yo AA man with history of ESRD, on HD, ongoing cocaine abuse and multiple other medical problems noted below, sent from dialysis center for evaluation. Patient reports that he has been using cocaine daily for the past week or so. He lives alone in an apartment.Today after his dialysis he was feeling week and had high BP and was also having some chest discomfort therefore he was sent to ER. EMS obtained ECG which reportedly was concerning for ST depression in multiple leads. He denies fever, chills, N/V, syncope, palpitations, diaphoresis, orthopnea. ECG in the ER reveals voltage criteria for LVH and diffuse T inversion in I, inferior leads and and V5 V6.Marland Kitchen No ST elevation.   Upon my evaluation, he reports no more CP. No SOB. He reports that CP was intermittent, happened 2 or three times since last night, lasted for only a minute or two.   In ER SBP has been over 200 or so.    Review of Systems:  10 systems reviewed unremarkable except as noted in HPI    Past Medical History  Diagnosis Date  . Hypertension   . Anemia, chronic disease     a. Capsule endoscopy reportedly negative 08/2013. b. seen by heme with concern for minor B cell population on BMB, being observed.  . Chronic diastolic CHF (congestive heart failure) ([place])     a. EF 60 - 65% per [place] echo 11/2011. b. Echo 02/2012: severe LVH, focal basal hypertrophy, EF 60-65%, mild Mr.  . Cocaine abuse     mentioned in notes from [place]  . Hepatitis C antibody test positive     was HIV negative, 02/28/12  . Hepatitis B core antibody positive     03/01/10  . Positive QuantiFERON-TB Gold test     11/2011  . Helicobacter pylori gastritis     not defined if this was treated  . Polyp of colon, adenomatous     May 2012.  Dr Trenton Founds in [place]  . Hematochezia   . Head injury, closed, with concussion ([place])   . History of blood transfusion     . Arthritis     "right shoulder" (11/09/2012)  . Seizure disorder [facility])     questionable history of - will need to clarify with PCP  . Headache(784.0)   . Hypercalcemia   . Hyperpotassemia   . Tobacco abuse   . Protein-calorie malnutrition, severe ([place])   . Optic neuritis, left   . Hypertensive heart disease   . GI bleed 05/04/2012  . ESRD on hemodialysis ([place]) since 2012    a. Since 2012. ESRD was due to "drugs", primarily used cocaine.  Has 3-5 year hx of HTN, no DM.  Gets HD on TTS schedule at [facility]. Originally from [place].   Marland Kitchen ESRD (end stage renal disease) on dialysis [facility])     "TTS; [place]" (06/19/2015)     (Not in a hospital admission)   . aspirin  324 mg Oral Once    No current facility-administered medications on file prior to encounter.   Current Outpatient Prescriptions on File Prior to Encounter  Medication Sig Dispense Refill  . calcium acetate (PHOSLO) 667 MG capsule Take 2,001 mg by mouth 3 (three) times daily with meals.     . Darbepoetin Alfa (ARANESP) 200 MCG/0.4ML SOSY injection Inject 0.4 mLs (200 mcg total) into the vein every Tuesday with hemodialysis. (Patient not  taking: Reported on 06/19/2015) 1.68 mL   . doxercalciferol (HECTOROL) 4 MCG/2ML injection Inject 1.5 mLs (3 mcg total) into the vein Every Tuesday,Thursday,and Saturday with dialysis. (Patient not taking: Reported on 06/19/2015) 2 mL   . hydrALAZINE (APRESOLINE) 100 MG tablet Take 100 mg by mouth 2 (two) times daily. DON'T TAKE ON DIALYSIS DAYS    . levETIRAcetam (KEPPRA) 1000 MG tablet Take 1 tablet (1,000 mg total) by mouth daily. 30 tablet 0  . levETIRAcetam (KEPPRA) 500 MG tablet Take 1 tablet (500 mg total) by mouth Every Tuesday,Thursday,and Saturday with dialysis. 30 tablet 0  . losartan (COZAAR) 100 MG tablet Take 100 mg by mouth every evening.    . multivitamin (RENA-VIT) TABS tablet Take 1 tablet by mouth at bedtime. 30 each 0  . Nutritional Supplements (FEEDING SUPPLEMENT,  NEPRO CARB STEADY,) LIQD Take 237 mLs by mouth 2 (two) times daily between meals.  0     Infusions:    Allergies  Allergen Reactions  . Reglan [Metoclopramide] Other (See Comments)    Tardive dyskinesia in 11/2011 in [place] History  . Marital Status: Single    Spouse Name: N/A  . Number of Children: 1  . Years of Education: N/A   Occupational History  . Unemployed    Social History Main Topics  . Smoking status: Current Every Day Smoker -- 1.50 packs/day for 45 years    Types: Cigarettes  . Smokeless tobacco: Never Used  . Alcohol Use: 14.4 oz/week    24 Cans of beer per week     Comment: 1 40's per day  . Drug Use: Yes    Special: "Crack" cocaine, Cocaine, Marijuana     Comment: 06/19/2015 "still use but don't use drugs very much"  . Sexual Activity: Yes    Birth Control/ Protection: None   Other Topics Concern  . Not on file   Social History Narrative    Family History  Problem Relation Age of Onset  . Diabetes Mother   . Hypertension Mother   . Stroke Mother   . Kidney failure Mother   . Cancer Father     PHYSICAL EXAM: Filed Vitals:   08/16/15 1833  BP: 191/85  Pulse: 90  Temp: 98.7 F (37.1 C)  Resp: 16    No intake or output data in the 24 hours ending 08/16/15 1902  General:  Disheveled, No respiratory difficulty HEENT: normal Neck: supple. no JVD. Carotids 2+ bilat; no bruits. No lymphadenopathy or thryomegaly appreciated. Cor: PMI nondisplaced. Regular rate & rhythm. No rubs, gallops or murmurs. Lungs: clear Abdomen: soft, nontender, nondistended. No hepatosplenomegaly. No bruits or masses. Good bowel sounds. Extremities: no cyanosis, clubbing, rash, edema Neuro: alert & oriented x 3, cranial nerves grossly intact. moves all 4 extremities w/o difficulty. Affect pleasant.   Results for orders placed or performed during the hospital encounter of 08/16/15 (from the past 24 hour(s))  CBC     Status: Abnormal    Collection Time: 08/16/15  6:30 PM  Result Value Ref Range   WBC 4.0 4.0 - 10.5 K/uL   RBC 4.53 4.22 - 5.81 MIL/uL   Hemoglobin 12.2 (L) 13.0 - 17.0 g/dL   HCT 38.8 (L) 39.0 - 52.0 %   MCV 85.7 78.0 - 100.0 fL   MCH 26.9 26.0 - 34.0 pg   MCHC 31.4 30.0 - 36.0 g/dL   RDW 15.6 (H) 11.5 - 15.5 %   Platelets 162 150 -  400 K/uL  Differential     Status: None   Collection Time: 08/16/15  6:30 PM  Result Value Ref Range   Neutrophils Relative % 60 %   Neutro Abs 2.4 1.7 - 7.7 K/uL   Lymphocytes Relative 22 %   Lymphs Abs 0.9 0.7 - 4.0 K/uL   Monocytes Relative 12 %   Monocytes Absolute 0.5 0.1 - 1.0 K/uL   Eosinophils Relative 5 %   Eosinophils Absolute 0.2 0.0 - 0.7 K/uL   Basophils Relative 1 %   Basophils Absolute 0.0 0.0 - 0.1 K/uL  Protime-INR     Status: None   Collection Time: 08/16/15  6:30 PM  Result Value Ref Range   Prothrombin Time 13.0 11.6 - 15.2 seconds   INR 0.96 0.00 - 1.49  APTT     Status: None   Collection Time: 08/16/15  6:30 PM  Result Value Ref Range   aPTT 32 24 - 37 seconds   No results found.   ASSESSMENT:  1. Chest discomfort, probable angina  - likely related to cocaine binge and very high BP -- demand-supply mismatch  - CP free at this time; don't think ACS - He could have underlying CAD given multiple CAD risk factors   PLAN/DISCUSSION: Agree with inpatient admission- appreciate internal medicine  No need for emergent invasive cardiac procedures Abstinence from cocaine is crucial - he was counseled Start Verapamil 80 mg po tid (would avoid beta blockers now)  May need Nitro infusion for additional BP control while titrating his oral meds Continue Hydralazine and Losartan   Serial Trop x 2 Low dose ASA 81 mg po qd No need for heparin infusion unless rules in for MI   Cardiology will follow  Discussed with ER attending Dr. Pablo Ledger, MD Cardiology

## 2015-08-17 DIAGNOSIS — D631 Anemia in chronic kidney disease: Secondary | ICD-10-CM | POA: Diagnosis present

## 2015-08-17 DIAGNOSIS — Z66 Do not resuscitate: Secondary | ICD-10-CM | POA: Diagnosis not present

## 2015-08-17 DIAGNOSIS — I5032 Chronic diastolic (congestive) heart failure: Secondary | ICD-10-CM | POA: Diagnosis present

## 2015-08-17 DIAGNOSIS — Z681 Body mass index (BMI) 19 or less, adult: Secondary | ICD-10-CM | POA: Diagnosis not present

## 2015-08-17 DIAGNOSIS — Z9119 Patient's noncompliance with other medical treatment and regimen: Secondary | ICD-10-CM | POA: Diagnosis not present

## 2015-08-17 DIAGNOSIS — K5900 Constipation, unspecified: Secondary | ICD-10-CM | POA: Diagnosis not present

## 2015-08-17 DIAGNOSIS — I10 Essential (primary) hypertension: Secondary | ICD-10-CM | POA: Diagnosis not present

## 2015-08-17 DIAGNOSIS — R5381 Other malaise: Secondary | ICD-10-CM | POA: Diagnosis not present

## 2015-08-17 DIAGNOSIS — N185 Chronic kidney disease, stage 5: Secondary | ICD-10-CM | POA: Diagnosis not present

## 2015-08-17 DIAGNOSIS — I214 Non-ST elevation (NSTEMI) myocardial infarction: Secondary | ICD-10-CM | POA: Diagnosis not present

## 2015-08-17 DIAGNOSIS — Z992 Dependence on renal dialysis: Secondary | ICD-10-CM

## 2015-08-17 DIAGNOSIS — I161 Hypertensive emergency: Secondary | ICD-10-CM | POA: Diagnosis not present

## 2015-08-17 DIAGNOSIS — IMO0001 Reserved for inherently not codable concepts without codable children: Secondary | ICD-10-CM | POA: Diagnosis present

## 2015-08-17 DIAGNOSIS — Z841 Family history of disorders of kidney and ureter: Secondary | ICD-10-CM | POA: Diagnosis not present

## 2015-08-17 DIAGNOSIS — I132 Hypertensive heart and chronic kidney disease with heart failure and with stage 5 chronic kidney disease, or end stage renal disease: Secondary | ICD-10-CM | POA: Diagnosis present

## 2015-08-17 DIAGNOSIS — F1020 Alcohol dependence, uncomplicated: Secondary | ICD-10-CM | POA: Diagnosis present

## 2015-08-17 DIAGNOSIS — Z823 Family history of stroke: Secondary | ICD-10-CM | POA: Diagnosis not present

## 2015-08-17 DIAGNOSIS — F17210 Nicotine dependence, cigarettes, uncomplicated: Secondary | ICD-10-CM | POA: Diagnosis present

## 2015-08-17 DIAGNOSIS — R079 Chest pain, unspecified: Secondary | ICD-10-CM | POA: Diagnosis not present

## 2015-08-17 DIAGNOSIS — E43 Unspecified severe protein-calorie malnutrition: Secondary | ICD-10-CM | POA: Diagnosis present

## 2015-08-17 DIAGNOSIS — N186 End stage renal disease: Secondary | ICD-10-CM

## 2015-08-17 DIAGNOSIS — R9431 Abnormal electrocardiogram [ECG] [EKG]: Secondary | ICD-10-CM | POA: Diagnosis present

## 2015-08-17 DIAGNOSIS — F1410 Cocaine abuse, uncomplicated: Secondary | ICD-10-CM | POA: Diagnosis present

## 2015-08-17 DIAGNOSIS — I248 Other forms of acute ischemic heart disease: Secondary | ICD-10-CM

## 2015-08-17 DIAGNOSIS — N2581 Secondary hyperparathyroidism of renal origin: Secondary | ICD-10-CM | POA: Diagnosis present

## 2015-08-17 DIAGNOSIS — G40909 Epilepsy, unspecified, not intractable, without status epilepticus: Secondary | ICD-10-CM | POA: Diagnosis present

## 2015-08-17 DIAGNOSIS — Z8249 Family history of ischemic heart disease and other diseases of the circulatory system: Secondary | ICD-10-CM | POA: Diagnosis not present

## 2015-08-17 LAB — MRSA PCR SCREENING: MRSA by PCR: NEGATIVE

## 2015-08-17 LAB — TROPONIN I
TROPONIN I: 0.11 ng/mL — AB (ref <0.031)
TROPONIN I: 0.1 ng/mL — AB (ref <0.031)

## 2015-08-17 LAB — TSH: TSH: 3.101 u[IU]/mL (ref 0.350–4.500)

## 2015-08-17 LAB — BRAIN NATRIURETIC PEPTIDE: B NATRIURETIC PEPTIDE 5: 157.1 pg/mL — AB (ref 0.0–100.0)

## 2015-08-17 LAB — T4, FREE: FREE T4: 0.9 ng/dL (ref 0.61–1.12)

## 2015-08-17 MED ORDER — NITROGLYCERIN IN D5W 200-5 MCG/ML-% IV SOLN: 0.0000 ug/min | INTRAVENOUS | Status: DC

## 2015-08-17 MED ORDER — ISOSORBIDE MONONITRATE ER 30 MG PO TB24
30.0000 mg | ORAL_TABLET | Freq: Every day | ORAL | Status: DC
  Administered 2015-08-17: 30 mg via ORAL
  Filled 2015-08-17: qty 1

## 2015-08-17 MED ORDER — FOLIC ACID 1 MG PO TABS
1.0000 mg | ORAL_TABLET | Freq: Every day | ORAL | Status: DC
  Administered 2015-08-17 – 2015-08-26 (×9): 1 mg via ORAL
  Filled 2015-08-17 (×10): qty 1

## 2015-08-17 MED ORDER — HYDRALAZINE HCL 50 MG PO TABS: 100.0000 mg | ORAL_TABLET | Freq: Four times a day (QID) | ORAL | Status: DC

## 2015-08-17 MED ORDER — HYDRALAZINE HCL 50 MG PO TABS: 50.0000 mg | ORAL_TABLET | Freq: Four times a day (QID) | ORAL | Status: DC

## 2015-08-17 MED ORDER — VITAMIN B-1 100 MG PO TABS
100.0000 mg | ORAL_TABLET | Freq: Every day | ORAL | Status: DC
  Administered 2015-08-17 – 2015-08-26 (×9): 100 mg via ORAL
  Filled 2015-08-17 (×10): qty 1

## 2015-08-17 MED ORDER — HYDRALAZINE HCL 25 MG PO TABS: 25.0000 mg | ORAL_TABLET | Freq: Four times a day (QID) | ORAL | Status: DC

## 2015-08-17 MED ORDER — HYDRALAZINE HCL 25 MG PO TABS
25.0000 mg | ORAL_TABLET | Freq: Four times a day (QID) | ORAL | Status: DC
  Administered 2015-08-17 – 2015-08-19 (×9): 25 mg via ORAL
  Filled 2015-08-17 (×9): qty 1

## 2015-08-17 NOTE — Consult Note (Signed)
Name: [name] MRN: [id number] DOB: [birth year]    ADMISSION DATE:  08/16/2015 CONSULTATION DATE:  08/17/2015  REFERRING MD :  Dr. Myna Hidalgo  CHIEF COMPLAINT:  Chest pain  HISTORY OF PRESENT ILLNESS:   60 yo male with h/o ESRD on HD, cocaine, EtOH abuse.  After completing HD today he began having sharp substernal chest pain which was intermittent lasting for about 1-2 min.  This was relieved by sub-lingual nitro in the ambulance.  He was found to have hypertensive emergency in the ED and started on a nitro gtt.    He reports using crack yesterday and not taking his antihypertensives today.  PAST MEDICAL HISTORY :   has a past medical history of Hypertension; Anemia, chronic disease; Chronic diastolic CHF (congestive heart failure) ([place]); Cocaine abuse; Hepatitis C antibody test positive; Hepatitis B core antibody positive; Positive QuantiFERON-TB Gold test; Helicobacter pylori gastritis; Polyp of colon, adenomatous; Hematochezia; Head injury, closed, with concussion ([place]); History of blood transfusion; Arthritis; Seizure disorder ([place]); Headache(784.0); Hypercalcemia; Hyperpotassemia; Tobacco abuse; Protein-calorie malnutrition, severe ([place]); Optic neuritis, left; Hypertensive heart disease; GI bleed (05/04/2012); ESRD on hemodialysis ([place]) (since 2012); and ESRD (end stage renal disease) on dialysis ([place]).  has past surgical history that includes Shoulder open rotator cuff repair (Right); Total knee arthroplasty (Left); Bascilic vein transposition (03/07/2012); Insertion of dialysis catheter; Bascilic vein transposition (Left, 05/31/2012); Shoulder open rotator cuff repair (Right); [name] capsule study (N/A, 08/27/2013); and Joint replacement. Prior to Admission medications   Medication Sig Start Date End Date Taking? Authorizing Provider  calcium acetate (PHOSLO) 667 MG capsule Take 2,001 mg by mouth 3 (three) times daily with meals.    Yes Historical Provider, MD  doxercalciferol (HECTOROL) 4  MCG/2ML injection Inject 1.5 mLs (3 mcg total) into the vein Every Tuesday,Thursday,and Saturday with dialysis. 04/07/14  Yes Jones Bales, MD  hydrALAZINE (APRESOLINE) 100 MG tablet Take 100 mg by mouth 2 (two) times daily. DON'T TAKE ON DIALYSIS DAYS   Yes Historical Provider, MD  levETIRAcetam (KEPPRA) 1000 MG tablet Take 1 tablet (1,000 mg total) by mouth daily. 04/25/14  Yes Luan Moore, MD  losartan (COZAAR) 100 MG tablet Take 100 mg by mouth every evening. 10/02/14  Yes Historical Provider, MD   Allergies  Allergen Reactions  . Reglan [Metoclopramide] Other (See Comments)    Tardive dyskinesia in 11/2011 in [place]:  family history includes Cancer in his father; Diabetes in his mother; Hypertension in his mother; Kidney failure in his mother; Stroke in his mother. SOCIAL HISTORY:  reports that he has been smoking Cigarettes.  He has a 67.5 pack-year smoking history. He has never used smokeless tobacco. He reports that he drinks about 14.4 oz of alcohol per week. He reports that he uses illicit drugs ("Crack" cocaine, Cocaine, and Marijuana).  REVIEW OF SYSTEMS:   Constitutional: Negative for fever, chills, weight loss, malaise/fatigue and diaphoresis.  HENT: Negative for hearing loss, ear pain, nosebleeds, congestion, sore throat, neck pain, tinnitus and ear discharge.   Eyes: Negative for blurred vision, double vision, photophobia, pain, discharge and redness.  Respiratory: Negative for cough, hemoptysis, sputum production, shortness of breath, wheezing and stridor.   Cardiovascular: Negative for chest pain, palpitations, orthopnea, claudication, leg swelling and PND.  Gastrointestinal: Negative for heartburn, nausea, vomiting, abdominal pain, diarrhea, constipation, blood in stool and melena.  Genitourinary: Negative for dysuria, urgency, frequency, hematuria and flank pain.  Musculoskeletal: Negative for myalgias, back pain, joint pain and falls.  Skin:  Negative  for itching and rash.  Neurological: Negative for dizziness, tingling, tremors, sensory change, speech change, focal weakness, seizures, loss of consciousness, weakness and headaches.  Endo/Heme/Allergies: Negative for environmental allergies and polydipsia. Does not bruise/bleed easily.  SUBJECTIVE:   VITAL SIGNS: Temp:  [98 F (36.7 C)-98.7 F (37.1 C)] 98 F (36.7 C) (04/29 2320) Pulse Rate:  [74-90] 81 (04/29 2320) Resp:  [15-24] 21 (04/29 2320) BP: (175-236)/(78-188) 194/176 mmHg (04/29 2320) SpO2:  [98 %-100 %] 100 % (04/29 2320) Weight:  [51.2 kg (112 lb 14 oz)] 51.2 kg (112 lb 14 oz) (04/29 2203)  PHYSICAL EXAMINATION: General:  NAD Neuro:  CN II-XII intact, no focal deficits.  HEENT:  Poor dentition Cardiovascular:  RRR, 99991111, 3/6 holosystolic murmur across precordium Lungs:  CTA b/l no w/r/r Abdomen:  Soft, non tender, normal bowel sounds Musculoskeletal:  Normal bulk and tone Skin:  No c/c/e   Recent Labs Lab 08/16/15 1830  NA 132*  K 4.0  CL 96*  CO2 25  BUN 11  CREATININE 6.02*  GLUCOSE 92    Recent Labs Lab 08/16/15 1830  HGB 12.2*  HCT 38.8*  WBC 4.0  PLT 162   Dg Chest Portable 1 View  08/16/2015  CLINICAL DATA:  Patient from dialysis.  Five day crack cocaine binge EXAM: PORTABLE CHEST 1 VIEW COMPARISON:  3/2/ 17 FINDINGS: Mild cardiac enlargement. No pleural effusion or edema. No airspace consolidation. Degenerative changes are noted within both glenohumeral joints. IMPRESSION: 1. No acute cardiopulmonary abnormalities. Electronically Signed   By: Kerby Moors M.D.   On: 08/16/2015 19:04    ASSESSMENT / PLAN:  60 yo male with ESRD on HD admitted with accelerated hypertension 2/2 crack/cocaine use and nonadherent to home BP regimen on nitroglycerin gtt.    - Appropriate for stepdown  - continue nitro gtt for now  - resume home BP meds  - Cardiology doubts ACS  - currently chest pain free  PCCM will sign off, please call with further  questions or re consult if we can be of assistance.   Total consult time: 30 min   Meribeth Mattes, DO., MS [facility]   Pulmonary and [place] Pager: [phone]  08/17/2015, 12:14 AM

## 2015-08-17 NOTE — Progress Notes (Addendum)
[facility] TEAM 1 - Stepdown/ICU TEAM PROGRESS NOTE  [name] [id number] DOB: 1955/09/05 DOA: 08/16/2015 PCP: No primary care provider on file.  Admit HPI / Brief Narrative: 60 y.o. male with history significant for end-stage renal disease on hemodialysis, ongoing cocaine abuse, alcohol abuse, and GI bleed who presented to the ED from his dialysis center with severe acute hypertension, chest pain, and generalized weakness. Patient reported he had been using crack cocaine daily for 5-7 days. He went to his dialysis center for his regularly scheduled session and completed it without incident. Upon completion he was noted to be hypertensive to the 230s over 140s with chest pain. He was directed to the emergency department.   Upon arrival to the ED blood pressure was 236/149. EKG revealed sinus rhythm with LVH by voltage criteria and T-wave inversions in the inferolateral leads. Initial troponin is 0.13. Nitroglycerin drip was initiated. Cardiology was consulted.  HPI/Subjective: Pt is sleeping comfortably.  He easily awakens and denies current CP, sob, or n/v.    Assessment/Plan:  Chest pain - Demand ischemia  -Avoiding beta-blockers in setting of recent cocaine use - Cards following - no plans for intervention/eval - pt MUST stop cocaine abuse or he will likely die   Hypertensive emergency  -HTN rebounding w/ attempts to wean off nitro gtt - cont nitro for now, and titrate oral meds upward  ESRD on T/Th/Sat HD  -continue usual HD schedule   Cocaine / Polysubstance abuse -counseled toward cessation, and fact that ongoing use will likely kill him   Seizure disorder  -Continue Keppra   Anemia of CKD  -no sign of active blood loss   Code Status: FULL Family Communication: no family present at time of exam Disposition Plan: SDU   Consultants: [facility] Cardiology   Procedures: none  Antibiotics: none  DVT prophylaxis: SQ heparin   Objective: Blood pressure 172/81,  pulse 76, temperature 98.3 F (36.8 C), temperature source Oral, resp. rate 13, height 5\' 8"  (1.727 m), weight 51.6 kg (113 lb 12.1 oz), SpO2 100 %.  Intake/Output Summary (Last 24 hours) at 08/17/15 1407 Last data filed at 08/17/15 1220  Gross per 24 hour  Intake    467 ml  Output      0 ml  Net    467 ml     Exam: General: No acute respiratory distress Lungs: Clear to auscultation bilaterally without wheezes or crackles Cardiovascular: Regular rate and rhythm without murmur gallop or rub normal S1 and S2 Abdomen: Nontender, nondistended, soft, bowel sounds positive, no rebound, no ascites, no appreciable mass Extremities: No significant cyanosis, clubbing, or edema bilateral lower extremities  Data Reviewed:  Basic Metabolic Panel:  Recent Labs Lab 08/16/15 1830  NA 132*  K 4.0  CL 96*  CO2 25  GLUCOSE 92  BUN 11  CREATININE 6.02*  CALCIUM 8.2*    CBC:  Recent Labs Lab 08/16/15 1830  WBC 4.0  NEUTROABS 2.4  HGB 12.2*  HCT 38.8*  MCV 85.7  PLT 162    Liver Function Tests:  Recent Labs Lab 08/16/15 1830  AST 17  ALT 8*  ALKPHOS 65  BILITOT 0.6  PROT 7.2  ALBUMIN 2.9*   Coags:  Recent Labs Lab 08/16/15 1830  INR 0.96    Recent Labs Lab 08/16/15 1830  APTT 32    Cardiac Enzymes:  Recent Labs Lab 08/16/15 1830 08/16/15 2256 08/17/15 0437  TROPONINI 0.13* 0.11* 0.10*    Recent Results (from the past 240 hour(s))  MRSA PCR Screening     Status: None   Collection Time: 08/16/15 10:13 PM  Result Value Ref Range Status   MRSA by PCR NEGATIVE NEGATIVE Final    Comment:        The GeneXpert MRSA Assay (FDA approved for NASAL specimens only), is one component of a comprehensive MRSA colonization surveillance program. It is not intended to diagnose MRSA infection nor to guide or monitor treatment for MRSA infections.      Studies:   Recent x-ray studies have been reviewed in detail by the Attending Physician  Scheduled  Meds:  Scheduled Meds: . aspirin  324 mg Oral Once  . aspirin EC  81 mg Oral Daily  . atorvastatin  40 mg Oral q1800  . calcium acetate  2,001 mg Oral TID WC  . folic acid  1 mg Oral Daily  . heparin  5,000 Units Subcutaneous Q8H  . hydrALAZINE  100 mg Oral BID  . isosorbide mononitrate  30 mg Oral Daily  . levETIRAcetam  1,000 mg Oral Daily  . losartan  100 mg Oral QPM  . multivitamin with minerals  1 tablet Oral Daily  . pantoprazole (PROTONIX) IV  40 mg Intravenous Q24H  . sodium chloride flush  3 mL Intravenous Q12H  . thiamine  100 mg Oral Daily  . verapamil  80 mg Oral Q8H    Time spent on care of this patient: 35 mins   MCCLUNG,JEFFREY T , MD   [facility] Office  [phone] Pager - Text Page per Shea Evans as per below:  On-Call/Text Page:      [email or web address]      password [email or web address]  If 7PM-7AM, please contact night-coverage [email or web address] Password [email or web address] 08/17/2015, 2:07 PM

## 2015-08-17 NOTE — Progress Notes (Addendum)
Cardiologist: Dr. Tamala Julian (according to consult note in 2016 from Dr. Johnsie Cancel) Subjective:  No CP, no SOB +cocaine  Objective:  Vital Signs in the last 24 hours: Temp:  [98 F (36.7 C)-98.7 F (37.1 C)] 98.1 F (36.7 C) (04/30 0800) Pulse Rate:  [72-90] 72 (04/30 0800) Resp:  [12-25] 12 (04/30 0800) BP: (140-236)/(70-192) 140/70 mmHg (04/30 0800) SpO2:  [94 %-100 %] 94 % (04/30 0800) Weight:  [112 lb 14 oz (51.2 kg)-113 lb 12.1 oz (51.6 kg)] 113 lb 12.1 oz (51.6 kg) (04/30 0344)  Intake/Output from previous day: 04/29 0701 - 04/30 0700 In: 133 [I.V.:133] Out: 0    Physical Exam: General: Well developed, well nourished, in no acute distress. Head:  Normocephalic and atraumatic. Lungs: Clear to auscultation and percussion. Heart: Normal S1 and S2.  No murmur, rubs or gallops.  Abdomen: soft, non-tender, positive bowel sounds. Extremities: No clubbing or cyanosis. No edema. Neurologic: Alert and oriented x 3.    Lab Results:  Recent Labs  08/16/15 1830  WBC 4.0  HGB 12.2*  PLT 162    Recent Labs  08/16/15 1830  NA 132*  K 4.0  CL 96*  CO2 25  GLUCOSE 92  BUN 11  CREATININE 6.02*    Recent Labs  08/16/15 2256 08/17/15 0437  TROPONINI 0.11* 0.10*   Hepatic Function Panel  Recent Labs  08/16/15 1830  PROT 7.2  ALBUMIN 2.9*  AST 17  ALT 8*  ALKPHOS 65  BILITOT 0.6    Recent Labs  08/16/15 1835  CHOL 165   No results for input(s): PROTIME in the last 72 hours.  Imaging: Dg Chest Portable 1 View  08/16/2015  CLINICAL DATA:  Patient from dialysis.  Five day crack cocaine binge EXAM: PORTABLE CHEST 1 VIEW COMPARISON:  3/2/ 17 FINDINGS: Mild cardiac enlargement. No pleural effusion or edema. No airspace consolidation. Degenerative changes are noted within both glenohumeral joints. IMPRESSION: 1. No acute cardiopulmonary abnormalities. Electronically Signed   By: Kerby Moors M.D.   On: 08/16/2015 19:04   Personally viewed.   Telemetry: No  adverse rhythms Personally viewed.   EKG:  08/17/15 - NSR, LVH repol, J point elevation ant. TWI noted inf V5-6.  Personally viewed.  Cardiac Studies:  ECHO 03/03/12: - Left ventricle: Wall thickness was increased in a pattern of severe LVH. There was focal basal hypertrophy. Systolic function was normal. The estimated ejection fraction was in the range of 60% to 65%. There was an increased relative contribution of atrial contraction to ventricular filling. - Mitral valve: Mild regurgitation. - Left atrium: The atrium was mildly dilated.  Meds: Scheduled Meds: . aspirin  324 mg Oral Once  . aspirin EC  81 mg Oral Daily  . atorvastatin  40 mg Oral q1800  . calcium acetate  2,001 mg Oral TID WC  . folic acid  1 mg Oral Daily  . heparin  5,000 Units Subcutaneous Q8H  . hydrALAZINE  100 mg Oral BID  . levETIRAcetam  1,000 mg Oral Daily  . losartan  100 mg Oral QPM  . multivitamin with minerals  1 tablet Oral Daily  . pantoprazole (PROTONIX) IV  40 mg Intravenous Q24H  . sodium chloride flush  3 mL Intravenous Q12H  . thiamine  100 mg Oral Daily  . verapamil  80 mg Oral Q8H   Continuous Infusions: . nitroGLYCERIN 20 mcg/min (08/16/15 2057)  . nitroGLYCERIN 20 mcg/min (08/17/15 0000)   PRN Meds:.sodium chloride, acetaminophen, ALPRAZolam, LORazepam, ondansetron (  ZOFRAN) IV, sodium chloride flush  Assessment/Plan:   Demand ischemia in the setting of cocaine and severe HTN  - Mild low level troponin elevation  - No beta blocker cocaine  - NTG, verap, hydral. Will stop NTG IV and change to Imdur 30 QD  - No invasive work up.  - Await ECHO  ESRD  Cocaine  - may lead to sudden death  - encourage cessation  - he asked if he could get some help with this   SKAINS, [place] 08/17/2015, 10:28 AM

## 2015-08-18 ENCOUNTER — Inpatient Hospital Stay (HOSPITAL_COMMUNITY): Payer: Medicare Other

## 2015-08-18 DIAGNOSIS — I214 Non-ST elevation (NSTEMI) myocardial infarction: Secondary | ICD-10-CM

## 2015-08-18 DIAGNOSIS — F1410 Cocaine abuse, uncomplicated: Secondary | ICD-10-CM

## 2015-08-18 DIAGNOSIS — R9431 Abnormal electrocardiogram [ECG] [EKG]: Secondary | ICD-10-CM

## 2015-08-18 LAB — BASIC METABOLIC PANEL
ANION GAP: 11 (ref 5–15)
BUN: 23 mg/dL — ABNORMAL HIGH (ref 6–20)
CALCIUM: 8.3 mg/dL — AB (ref 8.9–10.3)
CHLORIDE: 97 mmol/L — AB (ref 101–111)
CO2: 26 mmol/L (ref 22–32)
CREATININE: 8.7 mg/dL — AB (ref 0.61–1.24)
GFR calc non Af Amer: 6 mL/min — ABNORMAL LOW (ref >60)
GFR, EST AFRICAN AMERICAN: 7 mL/min — AB (ref >60)
Glucose, Bld: 119 mg/dL — ABNORMAL HIGH (ref 65–99)
Potassium: 4.3 mmol/L (ref 3.5–5.1)
SODIUM: 134 mmol/L — AB (ref 135–145)

## 2015-08-18 LAB — CBC
HCT: 31.1 % — ABNORMAL LOW (ref 39.0–52.0)
HEMOGLOBIN: 9.7 g/dL — AB (ref 13.0–17.0)
MCH: 26.6 pg (ref 26.0–34.0)
MCHC: 31.2 g/dL (ref 30.0–36.0)
MCV: 85.4 fL (ref 78.0–100.0)
PLATELETS: 137 10*3/uL — AB (ref 150–400)
RBC: 3.64 MIL/uL — AB (ref 4.22–5.81)
RDW: 15.6 % — ABNORMAL HIGH (ref 11.5–15.5)
WBC: 3.9 10*3/uL — AB (ref 4.0–10.5)

## 2015-08-18 MED ORDER — NITROGLYCERIN IN D5W 200-5 MCG/ML-% IV SOLN
0.0000 ug/min | INTRAVENOUS | Status: DC
  Administered 2015-08-19: 15 ug/min via INTRAVENOUS
  Filled 2015-08-18: qty 250

## 2015-08-18 MED ORDER — VERAPAMIL HCL 120 MG PO TABS
120.0000 mg | ORAL_TABLET | Freq: Three times a day (TID) | ORAL | Status: DC
  Administered 2015-08-18 – 2015-08-26 (×17): 120 mg via ORAL
  Filled 2015-08-18 (×27): qty 1

## 2015-08-18 MED ORDER — LORAZEPAM 2 MG/ML IJ SOLN
2.0000 mg | INTRAMUSCULAR | Status: DC | PRN
  Administered 2015-08-18: 2 mg via INTRAVENOUS
  Administered 2015-08-19: 3 mg via INTRAVENOUS
  Administered 2015-08-19: 2 mg via INTRAVENOUS
  Administered 2015-08-19: 3 mg via INTRAVENOUS
  Filled 2015-08-18 (×2): qty 1
  Filled 2015-08-18 (×2): qty 2

## 2015-08-18 NOTE — Progress Notes (Signed)
Paged Dr. Thereasa Solo about pt's SBP being consistently 180.  Order to increase nitro if needed.  Also increased dosage of verapamil.  Will continue to monitor pt.

## 2015-08-18 NOTE — Progress Notes (Signed)
       Patient Name: [name] Date of Encounter: 08/18/2015    SUBJECTIVE: Denies chest pain. Rested well.   TELEMETRY:  NSR Filed Vitals:   08/18/15 0800 08/18/15 0849 08/18/15 0900 08/18/15 1000  BP: 155/71 177/80 160/79 146/74  Pulse: 84 85 80 78  Temp:  98.7 F (37.1 C)    TempSrc:  Oral    Resp: 18 26 12 16   Height:      Weight:      SpO2: 96% 98% 96% 97%    Intake/Output Summary (Last 24 hours) at 08/18/15 1145 Last data filed at 08/18/15 1140  Gross per 24 hour  Intake 1088.7 ml  Output      0 ml  Net 1088.7 ml   LABS: Basic Metabolic Panel:  Recent Labs  08/16/15 1830 08/18/15 0328  NA 132* 134*  K 4.0 4.3  CL 96* 97*  CO2 25 26  GLUCOSE 92 119*  BUN 11 23*  CREATININE 6.02* 8.70*  CALCIUM 8.2* 8.3*   CBC:  Recent Labs  08/16/15 1830 08/18/15 0328  WBC 4.0 3.9*  NEUTROABS 2.4  --   HGB 12.2* 9.7*  HCT 38.8* 31.1*  MCV 85.7 85.4  PLT 162 137*   Cardiac Enzymes:  Recent Labs  08/16/15 1830 08/16/15 2256 08/17/15 0437  TROPONINI 0.13* 0.11* 0.10*   BNP: Invalid input(s): POCBNP Hemoglobin A1C: No results for input(s): HGBA1C in the last 72 hours. Fasting Lipid Panel:  Recent Labs  08/16/15 1835  CHOL 165  HDL 49  LDLCALC 98  TRIG 90  CHOLHDL 3.4    Radiology/Studies:  Okay  Physical Exam: Blood pressure 146/74, pulse 78, temperature 98.7 F (37.1 C), temperature source Oral, resp. rate 16, height 5\' 8"  (1.727 m), weight 119 lb 14.9 oz (54.4 kg), SpO2 97 %. Weight change: 7 lb 0.9 oz (3.2 kg)  Wt Readings from Last 3 Encounters:  08/18/15 119 lb 14.9 oz (54.4 kg)  06/20/15 132 lb 15 oz (60.3 kg)  04/12/15 116 lb 13.5 oz (53 kg)    S4 gallop. Chest is clear Ext without edema.  ASSESSMENT:  1. Trace elevation troponin in setting of Cocaine and LVH  Plan:  1. Avoid cocaine 2. RFM 3. Dialysis 4. Call if we can help further.  Demetrios Isaacs 08/18/2015, 11:45 AM

## 2015-08-18 NOTE — Progress Notes (Signed)
[name] TEAM 1 - Stepdown/ICU TEAM PROGRESS NOTE  [name] [id number] DOB: 1955/10/22 DOA: 08/16/2015 PCP: No primary care provider on file.  Admit HPI / Brief Narrative: 60 y.o. male with history significant for end-stage renal disease on hemodialysis, ongoing cocaine abuse, alcohol abuse, and GI bleed who presented to the ED from his dialysis center with severe acute hypertension, chest pain, and generalized weakness. Patient reported he had been using crack cocaine daily for 5-7 days. He went to his dialysis center for his regularly scheduled session and completed it without incident. Upon completion he was noted to be hypertensive to the 230s over 140s with chest pain. He was directed to the emergency department.   Upon arrival to the ED blood pressure was 236/149. EKG revealed sinus rhythm with LVH by voltage criteria and T-wave inversions in the inferolateral leads. Initial troponin is 0.13. Nitroglycerin drip was initiated. Cardiology was consulted.  HPI/Subjective: Pt denies any further CP.  [name] highly motivated to stop using cocaine, but says he needs help w/ this.  He denies n/v, sob, abdom pain, or HA.    Assessment/Plan:  Chest pain - Demand ischemia  -Avoiding beta-blockers in setting of recent cocaine use - Cards following - no plans for intervention/eval   Hypertensive emergency  -HTN rebounded 4/30 w/ attempts to wean off nitro gtt - BP now better controlled - attempt to wean from nitro again today - encouraged medication compliance and cocaine abstinence   ESRD on T/Th/Sat HD  -continue usual HD schedule - left message on Nephrology VM to alert them to need for HD tomorrow   Cocaine / Polysubstance abuse -counseled toward cessation, and fact that ongoing use will likely kill him - he states he is motivated to stop but needs help - consulted CSW to help assist in any way possible   Seizure disorder  -Continue Keppra   Anemia of CKD  -no sign of active  blood loss   Code Status: FULL Family Communication: no family present at time of exam Disposition Plan: SDU until off nitro gtt then transfer to renal bed   Consultants: [facility]   Procedures: 4/29 TTE - pending   Antibiotics: none  DVT prophylaxis: SQ heparin   Objective: Blood pressure 160/79, pulse 80, temperature 98.7 F (37.1 C), temperature source Oral, resp. rate 12, height 5\' 8"  (1.727 m), weight 54.4 kg (119 lb 14.9 oz), SpO2 96 %.  Intake/Output Summary (Last 24 hours) at 08/18/15 1031 Last data filed at 08/18/15 0900  Gross per 24 hour  Intake 1071.45 ml  Output      0 ml  Net 1071.45 ml    Exam: General: No acute respiratory distress - alert and conversant  Lungs: Clear to auscultation bilaterally without wheezes or crackles Cardiovascular: Regular rate and rhythm without murmur Abdomen: Nontender, nondistended, soft, bowel sounds positive, no rebound, no ascites, no appreciable mass Extremities: No significant cyanosis, clubbing, edema bilateral lower extremities  Data Reviewed:  Basic Metabolic Panel:  Recent Labs Lab 08/16/15 1830 08/18/15 0328  NA 132* 134*  K 4.0 4.3  CL 96* 97*  CO2 25 26  GLUCOSE 92 119*  BUN 11 23*  CREATININE 6.02* 8.70*  CALCIUM 8.2* 8.3*    CBC:  Recent Labs Lab 08/16/15 1830 08/18/15 0328  WBC 4.0 3.9*  NEUTROABS 2.4  --   HGB 12.2* 9.7*  HCT 38.8* 31.1*  MCV 85.7 85.4  PLT 162 137*    Liver Function Tests:  Recent Labs  Lab 08/16/15 1830  AST 17  ALT 8*  ALKPHOS 65  BILITOT 0.6  PROT 7.2  ALBUMIN 2.9*   Coags:  Recent Labs Lab 08/16/15 1830  INR 0.96    Recent Labs Lab 08/16/15 1830  APTT 32    Cardiac Enzymes:  Recent Labs Lab 08/16/15 1830 08/16/15 2256 08/17/15 0437  TROPONINI 0.13* 0.11* 0.10*    Recent Results (from the past 240 hour(s))  MRSA PCR Screening     Status: None   Collection Time: 08/16/15 10:13 PM  Result Value Ref Range Status   MRSA by PCR  NEGATIVE NEGATIVE Final    Comment:        The GeneXpert MRSA Assay (FDA approved for NASAL specimens only), is one component of a comprehensive MRSA colonization surveillance program. It is not intended to diagnose MRSA infection nor to guide or monitor treatment for MRSA infections.      Studies:   Recent x-ray studies have been reviewed in detail by the Attending Physician  Scheduled Meds:  Scheduled Meds: . aspirin EC  81 mg Oral Daily  . atorvastatin  40 mg Oral q1800  . calcium acetate  2,001 mg Oral TID WC  . folic acid  1 mg Oral Daily  . heparin  5,000 Units Subcutaneous Q8H  . hydrALAZINE  25 mg Oral Q6H  . levETIRAcetam  1,000 mg Oral Daily  . losartan  100 mg Oral QPM  . multivitamin with minerals  1 tablet Oral Daily  . pantoprazole (PROTONIX) IV  40 mg Intravenous Q24H  . sodium chloride flush  3 mL Intravenous Q12H  . thiamine  100 mg Oral Daily  . verapamil  80 mg Oral Q8H    Time spent on care of this patient: 35 mins   Keianna Signer T , MD   [facility] Office  [phone] Pager - Text Page per Shea Evans as per below:  On-Call/Text Page:      [email or web address]      password [email or web address]  If 7PM-7AM, please contact night-coverage [email or web address] Password [email or web address] 08/18/2015, 10:31 AM   LOS: 1 day

## 2015-08-19 ENCOUNTER — Inpatient Hospital Stay (HOSPITAL_COMMUNITY): Payer: Medicare Other

## 2015-08-19 DIAGNOSIS — R079 Chest pain, unspecified: Secondary | ICD-10-CM

## 2015-08-19 DIAGNOSIS — Z720 Tobacco use: Secondary | ICD-10-CM

## 2015-08-19 LAB — GLUCOSE, CAPILLARY: Glucose-Capillary: 80 mg/dL (ref 65–99)

## 2015-08-19 LAB — ECHOCARDIOGRAM COMPLETE
HEIGHTINCHES: 68 in
Weight: 1918.88 oz

## 2015-08-19 LAB — CBC
HCT: 31.1 % — ABNORMAL LOW (ref 39.0–52.0)
Hemoglobin: 9.8 g/dL — ABNORMAL LOW (ref 13.0–17.0)
MCH: 26.3 pg (ref 26.0–34.0)
MCHC: 31.5 g/dL (ref 30.0–36.0)
MCV: 83.6 fL (ref 78.0–100.0)
Platelets: 147 10*3/uL — ABNORMAL LOW (ref 150–400)
RBC: 3.72 MIL/uL — ABNORMAL LOW (ref 4.22–5.81)
RDW: 15.4 % (ref 11.5–15.5)
WBC: 3.4 K/uL — ABNORMAL LOW (ref 4.0–10.5)

## 2015-08-19 LAB — RENAL FUNCTION PANEL
Albumin: 2.4 g/dL — ABNORMAL LOW (ref 3.5–5.0)
Anion gap: 10 (ref 5–15)
BUN: 45 mg/dL — ABNORMAL HIGH (ref 6–20)
CO2: 24 mmol/L (ref 22–32)
Calcium: 8.4 mg/dL — ABNORMAL LOW (ref 8.9–10.3)
Chloride: 98 mmol/L — ABNORMAL LOW (ref 101–111)
Creatinine, Ser: 10.19 mg/dL — ABNORMAL HIGH (ref 0.61–1.24)
GFR calc Af Amer: 6 mL/min — ABNORMAL LOW (ref >60)
GFR calc non Af Amer: 5 mL/min — ABNORMAL LOW (ref >60)
Glucose, Bld: 86 mg/dL (ref 65–99)
Phosphorus: 4.6 mg/dL (ref 2.5–4.6)
Potassium: 4.3 mmol/L (ref 3.5–5.1)
Sodium: 132 mmol/L — ABNORMAL LOW (ref 135–145)

## 2015-08-19 LAB — HEPATITIS B SURFACE ANTIGEN: Hepatitis B Surface Ag: NEGATIVE

## 2015-08-19 MED ORDER — LORAZEPAM 2 MG/ML IJ SOLN
INTRAMUSCULAR | Status: AC
  Administered 2015-08-19: 3 mg via INTRAVENOUS
  Filled 2015-08-19: qty 2

## 2015-08-19 MED ORDER — SODIUM CHLORIDE 0.9 % IV SOLN: 100.0000 mL | INTRAVENOUS | Status: DC | PRN

## 2015-08-19 MED ORDER — PENTAFLUOROPROP-TETRAFLUOROETH EX AERO: 1.0000 "application " | INHALATION_SPRAY | CUTANEOUS | Status: DC | PRN

## 2015-08-19 MED ORDER — ALTEPLASE 2 MG IJ SOLR: 2.0000 mg | Freq: Once | INTRAMUSCULAR | Status: DC | PRN

## 2015-08-19 MED ORDER — LIDOCAINE-PRILOCAINE 2.5-2.5 % EX CREA
1.0000 "application " | TOPICAL_CREAM | CUTANEOUS | Status: DC | PRN
  Filled 2015-08-19: qty 5

## 2015-08-19 MED ORDER — LIDOCAINE HCL (PF) 1 % IJ SOLN: 5.0000 mL | INTRAMUSCULAR | Status: DC | PRN

## 2015-08-19 MED ORDER — HEPARIN SODIUM (PORCINE) 1000 UNIT/ML DIALYSIS: 1000.0000 [IU] | INTRAMUSCULAR | Status: DC | PRN

## 2015-08-19 NOTE — Progress Notes (Signed)
Echocardiogram 2D Echocardiogram has been performed.  [name] 08/19/2015, 12:58 PM

## 2015-08-19 NOTE — Progress Notes (Signed)
[place] TEAM 1 - Stepdown/ICU TEAM Progress Note  [name] [id number] DOB: [birth year] DOA: 08/16/2015 PCP: No primary care provider on file.  Admit HPI / Brief Narrative: 60 y.o. BM PMHx ESRD on T/Th/Sat HD , Cocaine Abuse, EtOH abuse, and GI bleed   Who presented to the ED from his dialysis center with severe acute hypertension, chest pain, and generalized weakness. Patient reported he had been using crack cocaine daily for 5-7 days. He went to his dialysis center for his regularly scheduled session and completed it without incident. Upon completion he was noted to be hypertensive to the 230s over 140s with chest pain. He was directed to the emergency department.   Upon arrival to the ED blood pressure was 236/149. EKG revealed sinus rhythm with LVH by voltage criteria and T-wave inversions in the inferolateral leads. Initial troponin is 0.13. Nitroglycerin drip was initiated. Cardiology was consulted.  HPI/Subjective: 5/2 A/O 4, NAD. Negative CP, negative SOB, negative N/V.  Assessment/Plan:  Chest pain - Demand ischemia  -Avoiding beta-blockers in setting of recent cocaine use - Cards following - no plans for intervention/eval   Hypertensive emergency  -HTN rebounded 4/30 w/ attempts to wean off nitro gtt  - Hydralazine 25 mg QID -Losartan 100 mg daily  - encouraged medication compliance and cocaine abstinence. Patient not interested   ESRD on T/Th/Sat HD  -continue usual HD schedule - left message on Nephrology VM to alert them to need for HD tomorrow   Cocaine / Polysubstance abuse -counseled toward cessation, and fact that ongoing use will likely kill him  - Patient disinterested in discontinuing EtOH or Cocaine. -Continue CIWA protocol  Seizure disorder  -Continue Keppra 1000 mg daily  Anemia of CKD  -no sign of active blood loss   Goals of Care - -Discussed at length difference between DO NOT RESUSCITATE and full code. After answering all questions  Patient decided on DO NOT RESUSCITATE.   Code Status: DO NOT RESUSCITATE Family Communication: no family present at time of exam Disposition Plan: Per nephrology    Consultants: Dr.Robert Schertz GI  Procedure/Significant Events: 5/2 echocardiogram;Left ventricle: mildly dilated.  mild concentric hypertrophy. -LVEF=f 55% to 60%.  -(grade 1 diastolic dysfunction).- Left atrium: severely dilated.- Right atrium: mildly dilated. -Pulmonary arteries: PA peak pressure: 31 mm Hg (S).  Culture NA  Antibiotics: NA  DVT prophylaxis: Subcutaneous heparin   Devices NA   LINES / TUBES:  NA    Continuous Infusions: . nitroGLYCERIN Stopped (08/19/15 0720)    Objective: VITAL SIGNS: Temp: 97.4 F (36.3 C) (05/02 1655) Temp Source: Oral (05/02 1655) BP: 152/69 mmHg (05/02 1800) Pulse Rate: 78 (05/02 1800) SPO2; FIO2:   Intake/Output Summary (Last 24 hours) at 08/19/15 1812 Last data filed at 08/19/15 1300  Gross per 24 hour  Intake    835 ml  Output      0 ml  Net    835 ml     Exam: General: A/O 4, NAD, No acute respiratory distress Eyes: negative scleral hemorrhage, negative icterus ENT: Negative Runny nose, negative gingival bleeding, Neck:  Negative scars, masses, torticollis, lymphadenopathy, JVD Lungs: Clear to auscultation bilaterally without wheezes or crackles Cardiovascular: Regular rate and rhythm without murmur gallop or rub normal S1 and S2 Abdomen:negative abdominal pain, nondistended, positive soft, bowel sounds, no rebound, no ascites, no appreciable mass Extremities: No significant cyanosis, clubbing, or edema bilateral lower extremities Psychiatric:  Negative depression, negative anxiety, negative fatigue, negative mania  Neurologic:  Cranial nerves II  through XII intact, tongue/uvula midline, all extremities muscle strength 5/5, sensation intact throughout, negative dysarthria, negative expressive aphasia, negative receptive aphasia.   Data  Reviewed: Basic Metabolic Panel:  Recent Labs Lab 08/16/15 1830 08/18/15 0328 08/19/15 1633  NA 132* 134* 132*  K 4.0 4.3 4.3  CL 96* 97* 98*  CO2 25 26 24   GLUCOSE 92 119* 86  BUN 11 23* 45*  CREATININE 6.02* 8.70* 10.19*  CALCIUM 8.2* 8.3* 8.4*  PHOS  --   --  4.6   Liver Function Tests:  Recent Labs Lab 08/16/15 1830 08/19/15 1633  AST 17  --   ALT 8*  --   ALKPHOS 65  --   BILITOT 0.6  --   PROT 7.2  --   ALBUMIN 2.9* 2.4*   No results for input(s): LIPASE, AMYLASE in the last 168 hours. No results for input(s): AMMONIA in the last 168 hours. CBC:  Recent Labs Lab 08/16/15 1830 08/18/15 0328 08/19/15 1633  WBC 4.0 3.9* 3.4*  NEUTROABS 2.4  --   --   HGB 12.2* 9.7* 9.8*  HCT 38.8* 31.1* 31.1*  MCV 85.7 85.4 83.6  PLT 162 137* 147*   Cardiac Enzymes:  Recent Labs Lab 08/16/15 1830 08/16/15 2256 08/17/15 0437  TROPONINI 0.13* 0.11* 0.10*   BNP (last 3 results)  Recent Labs  08/16/15 2256  BNP 157.1*    ProBNP (last 3 results) No results for input(s): PROBNP in the last 8760 hours.  CBG: No results for input(s): GLUCAP in the last 168 hours.  Recent Results (from the past 240 hour(s))  MRSA PCR Screening     Status: None   Collection Time: 08/16/15 10:13 PM  Result Value Ref Range Status   MRSA by PCR NEGATIVE NEGATIVE Final    Comment:        The GeneXpert MRSA Assay (FDA approved for NASAL specimens only), is one component of a comprehensive MRSA colonization surveillance program. It is not intended to diagnose MRSA infection nor to guide or monitor treatment for MRSA infections.      Studies:  Recent x-ray studies have been reviewed in detail by the Attending Physician  Scheduled Meds:  Scheduled Meds: . aspirin EC  81 mg Oral Daily  . atorvastatin  40 mg Oral q1800  . calcium acetate  2,001 mg Oral TID WC  . folic acid  1 mg Oral Daily  . heparin  5,000 Units Subcutaneous Q8H  . hydrALAZINE  25 mg Oral Q6H  .  levETIRAcetam  1,000 mg Oral Daily  . losartan  100 mg Oral QPM  . multivitamin with minerals  1 tablet Oral Daily  . thiamine  100 mg Oral Daily  . verapamil  120 mg Oral Q8H    Time spent on care of this patient: 40 mins   Norely Schlick, Geraldo Docker , MD  [facility] Office  [phone] Pager [phone]  On-Call/Text Page:      [email or web address]      password [email or web address]  If 7PM-7AM, please contact night-coverage [email or web address] Password [email or web address] 08/19/2015, 6:12 PM   LOS: 2 days   Care during the described time interval was provided by me .  I have reviewed this patient's available data, including medical history, events of note, physical examination, and all test results as part of my evaluation. I have personally reviewed and interpreted all radiology studies.   Dia Crawford, MD [phone] Pager

## 2015-08-19 NOTE — Consult Note (Signed)
[name] Note    Indication for Consultation:  Management of ESRD/hemodialysis; anemia, hypertension/volume and secondary hyperparathyroidism PCP: No PCP on file  HPI: [name] is a 60 y.o. male with ESRD on hemodialysis TTS at [facility]. PMH significant for hypertensive, hepatitis C, hepatitis B core antibody positive, GI bleed, Chronic diastolic HF, medical noncompliance, seizure disorder, cocaine abuse.  He presented to ED 08/16/15 with hypertensive emergency, weakness and chest pain. Patient admitted that he has been doing crack for last 5-7 days. Did attend HD 08/16/15 but was found to be hypertensive post HD and was sent to ED for evaluation. Patient presented with SBP 240s DBP 140s, elevated troponin 0.13. He was started on NTG drip for hypertension and cardiology was consulted. No cardiac intervention has been planned, cardiology has made recommendation to stop using crack and has signed off. We have been asked to see patient for hemodialysis. Had issues weaning NTG drip d/t rebound hypertension but patient has been off NTG drip since 0720 this AM.   Currently patient is awake, alert, denies chest pain/SOB, fever, chills, nausea, vomiting, diarrhea, bloody tarry stools, abdominal pain, headache, vision changes, hearing changes. Was told he will go for hemodialysis today and he said, "Not until I get some food".  Patient has HD at [facility]. Last in-center HD was 08/16/15. Has history of missed treatments/early sign offs.   Past Medical History  Diagnosis Date  . Hypertension   . Anemia, chronic disease     a. Capsule endoscopy reportedly negative 08/2013. b. seen by heme with concern for minor B cell population on BMB, being observed.  . Chronic diastolic CHF (congestive heart failure) ([place])     a. EF 60 - 65% per [place] echo 11/2011. b. Echo 02/2012: severe LVH, focal basal hypertrophy, EF 60-65%, mild Mr.  . Cocaine abuse     mentioned in notes from [address]  . Hepatitis C antibody test positive     was HIV negative, 02/28/12  . Hepatitis B core antibody positive     03/01/10  . Positive QuantiFERON-TB Gold test     11/2011  . Helicobacter pylori gastritis     not defined if this was treated  . Polyp of colon, adenomatous     May 2012.  Dr Trenton Founds in [place]  . Hematochezia   . Head injury, closed, with concussion ([place])   . History of blood transfusion   . Arthritis     "right shoulder" (11/09/2012)  . Seizure disorder [facility])     questionable history of - will need to clarify with PCP  . Headache(784.0)   . Hypercalcemia   . Hyperpotassemia   . Tobacco abuse   . Protein-calorie malnutrition, severe ([place])   . Optic neuritis, left   . Hypertensive heart disease   . GI bleed 05/04/2012  . ESRD on hemodialysis ([name]) since 2012    a. Since 2012. ESRD was due to "drugs", primarily used cocaine.  Has 3-5 year hx of HTN, no DM.  Gets HD on TTS schedule at [place]. Originally from [place].   Marland Kitchen ESRD (end stage renal disease) on dialysis [facility])     "TTS; [address]" (06/19/2015)   Past Surgical History  Procedure Laterality Date  . Shoulder open rotator cuff repair Right   . Total knee arthroplasty Left   . Bascilic vein transposition  03/07/2012    Procedure: BASCILIC VEIN TRANSPOSITION;  Surgeon: Conrad Dibble, MD;  Location: [facility]  OR;  Service: Vascular;  Laterality: Left;  First Stage  . Insertion of dialysis catheter      right chest  . Bascilic vein transposition Left 05/31/2012    Procedure: BASCILIC VEIN TRANSPOSITION;  Surgeon: Conrad Emington, MD;  Location: [place];  Service: Vascular;  Laterality: Left;  Left 2nd Stage Basilic Vein Transposition with gortex graft revision using 23mmx10cm graft  . Shoulder open rotator cuff repair Right   . [name] capsule study N/A 08/27/2013    Procedure: [name] CAPSULE STUDY;  Surgeon: Milus Banister, MD;  Location: [place];  Service: Endoscopy;   Laterality: N/A;  . Joint replacement     Family History  Problem Relation Age of Onset  . Diabetes Mother   . Hypertension Mother   . Stroke Mother   . Kidney failure Mother   . Cancer Father    Social History:  reports that he has been smoking Cigarettes.  He has a 67.5 pack-year smoking history. He has never used smokeless tobacco. He reports that he drinks about 14.4 oz of alcohol per week. He reports that he uses illicit drugs ("Crack" cocaine, Cocaine, and Marijuana). Allergies  Allergen Reactions  . Reglan [Metoclopramide] Other (See Comments)    Tardive dyskinesia in 11/2011 in [place]   Prior to Admission medications   Medication Sig Start Date End Date Taking? Authorizing Provider  calcium acetate (PHOSLO) 667 MG capsule Take 2,001 mg by mouth 3 (three) times daily with meals.    Yes Historical Provider, MD  doxercalciferol (HECTOROL) 4 MCG/2ML injection Inject 1.5 mLs (3 mcg total) into the vein Every Tuesday,Thursday,and Saturday with dialysis. 04/07/14  Yes Jones Bales, MD  hydrALAZINE (APRESOLINE) 100 MG tablet Take 100 mg by mouth 2 (two) times daily. DON'T TAKE ON DIALYSIS DAYS   Yes Historical Provider, MD  levETIRAcetam (KEPPRA) 1000 MG tablet Take 1 tablet (1,000 mg total) by mouth daily. 04/25/14  Yes Luan Moore, MD  losartan (COZAAR) 100 MG tablet Take 100 mg by mouth every evening. 10/02/14  Yes Historical Provider, MD   Current Facility-Administered Medications  Medication Dose Route Frequency Provider Last Rate Last Dose  . acetaminophen (TYLENOL) tablet 650 mg  650 mg Oral Q4H PRN Vianne Bulls, MD      . aspirin EC tablet 81 mg  81 mg Oral Daily Vianne Bulls, MD   81 mg at 08/19/15 0752  . atorvastatin (LIPITOR) tablet 40 mg  40 mg Oral q1800 Vianne Bulls, MD   40 mg at 08/18/15 1708  . calcium acetate (PHOSLO) capsule 2,001 mg  2,001 mg Oral TID WC Vianne Bulls, MD   2,001 mg at 08/19/15 0751  . folic acid (FOLVITE) tablet 1 mg  1 mg Oral Daily  Cherene Altes, MD   1 mg at 08/19/15 0752  . heparin injection 5,000 Units  5,000 Units Subcutaneous Q8H Vianne Bulls, MD   5,000 Units at 08/19/15 [phone]  . hydrALAZINE (APRESOLINE) tablet 25 mg  25 mg Oral Q6H Cherene Altes, MD   25 mg at 08/19/15 [phone]  . levETIRAcetam (KEPPRA) tablet 1,000 mg  1,000 mg Oral Daily Vianne Bulls, MD   1,000 mg at 08/19/15 0752  . LORazepam (ATIVAN) injection 2-3 mg  2-3 mg Intravenous Q4H PRN Cherene Altes, MD   2 mg at 08/18/15 2008  . losartan (COZAAR) tablet 100 mg  100 mg Oral QPM Vianne Bulls, MD   100 mg at 08/18/15  1707  . multivitamin with minerals tablet 1 tablet  1 tablet Oral Daily Vianne Bulls, MD   1 tablet at 08/19/15 0752  . nitroGLYCERIN 50 mg in dextrose 5 % 250 mL (0.2 mg/mL) infusion  0-100 mcg/min Intravenous Titrated Cherene Altes, MD   Stopped at 08/19/15 0720  . ondansetron (ZOFRAN) injection 4 mg  4 mg Intravenous Q6H PRN Vianne Bulls, MD      . thiamine (VITAMIN B-1) tablet 100 mg  100 mg Oral Daily Cherene Altes, MD   100 mg at 08/19/15 0751  . verapamil (CALAN) tablet 120 mg  120 mg Oral Q8H Cherene Altes, MD   120 mg at 08/19/15 [id number]   Labs: Basic Metabolic Panel:  Recent Labs Lab 08/16/15 1830 08/18/15 0328  NA 132* 134*  K 4.0 4.3  CL 96* 97*  CO2 25 26  GLUCOSE 92 119*  BUN 11 23*  CREATININE 6.02* 8.70*  CALCIUM 8.2* 8.3*   Liver Function Tests:  Recent Labs Lab 08/16/15 1830  AST 17  ALT 8*  ALKPHOS 65  BILITOT 0.6  PROT 7.2  ALBUMIN 2.9*   No results for input(s): LIPASE, AMYLASE in the last 168 hours. No results for input(s): AMMONIA in the last 168 hours. CBC:  Recent Labs Lab 08/16/15 1830 08/18/15 0328  WBC 4.0 3.9*  NEUTROABS 2.4  --   HGB 12.2* 9.7*  HCT 38.8* 31.1*  MCV 85.7 85.4  PLT 162 137*   Cardiac Enzymes:  Recent Labs Lab 08/16/15 1830 08/16/15 2256 08/17/15 0437  TROPONINI 0.13* 0.11* 0.10*    Studies/Results: No results found.  ROS: As  per HPI otherwise negative.   Physical Exam: Filed Vitals:   08/19/15 0900 08/19/15 1000 08/19/15 1100 08/19/15 1109  BP: 130/71 149/69 158/61 158/61  Pulse:      Temp:    98.2 F (36.8 C)  TempSrc:    Oral  Resp: 16 15 18 20   Height:      Weight:      SpO2:    96%     General: Chronically ill appearing male, lying flat in bed, NAD Head: Normocephalic, atraumatic, sclera non-icteric, mucus membranes are moist Neck: Supple. JVD not elevated. Lungs: Clear bilaterally to auscultation without wheezes, rales, or rhonchi. Breathing is unlabored. Heart: RRR with S1 S2. No murmurs, rubs, or gallops appreciated. Abdomen: Soft, non-tender, non-distended with normoactive bowel sounds. No rebound/guarding. No obvious abdominal masses. M-S:  Strength and tone appear normal for age. Lower extremities:without edema or ischemic changes, no open wounds  Neuro: Alert and oriented X 3. Moves all extremities spontaneously. Psych:  Responds to questions appropriately with a normal affect. Dialysis Access: LUA AVF + bruit  Dialysis Orders:  [place] TTS BFR 400, DFR Autoflow 1.5 EDW 55 (kg),  3.0 K, 2.25 Ca,  UFR Profile: Profile 2 LUA AVF Hectoral 7 mcg IV q treatment  Assessment/Plan: 1.  Hypertensive emergency d/t cocaine abuse: Per primary. Off NTG drip. Avoiding BB. BP 158/61 at present 2.  Chest Pain: Troponin 0.13, 0.11, 0.10. Cardiology consulted. Demand ischemia d/t cocaine abuse. Echo pending. No intervention planned. Cardiology signed off.  3.  ESRD -  TTS @ [facility] Will have HD today on schedule. K+ 4.3 Use 2.0 K bath.  4.  Hypertension/volume  - Has been started on losartan, hydralazine and verapamil. Vasodilator drip Dc'd. Wt 54.5 kg. Attempt UFG 1-1.5 liters today.  5.  Anemia  - HGB 9.7. Not on OP ESA,  last in-center HGB 13.2 (08/07/15). Will give aranesp 40 mcg IV today.  6.  Metabolic bone disease -  Ca 8.3 C Ca 9.18 Last in-center Phos 9.2 (08/01/15). Cont VDRA, binders 7.   Nutrition - Albumin 2.9. Renal diet, renal vit/supplement 8. Seizure disorder: Per primary. Cont keppra 9. Polysubstance abuse: on CIWA protocol per primary   Rita H. Owens Shark, NP-C 08/19/2015, 12:52 PM  [facility] Beeper [phone]  Pt seen, examined and agree w A/P as above.  Kelly Splinter MD [employer] pager [phone]    cell [phone] 08/19/2015, 2:34 PM

## 2015-08-20 ENCOUNTER — Other Ambulatory Visit: Payer: Self-pay

## 2015-08-20 DIAGNOSIS — I739 Peripheral vascular disease, unspecified: Secondary | ICD-10-CM

## 2015-08-20 LAB — RENAL FUNCTION PANEL
Albumin: 2.6 g/dL — ABNORMAL LOW (ref 3.5–5.0)
Anion gap: 10 (ref 5–15)
BUN: 26 mg/dL — ABNORMAL HIGH (ref 6–20)
CO2: 27 mmol/L (ref 22–32)
Calcium: 8.7 mg/dL — ABNORMAL LOW (ref 8.9–10.3)
Chloride: 96 mmol/L — ABNORMAL LOW (ref 101–111)
Creatinine, Ser: 6.16 mg/dL — ABNORMAL HIGH (ref 0.61–1.24)
GFR calc Af Amer: 10 mL/min — ABNORMAL LOW (ref >60)
GFR calc non Af Amer: 9 mL/min — ABNORMAL LOW (ref >60)
Glucose, Bld: 127 mg/dL — ABNORMAL HIGH (ref 65–99)
Phosphorus: 2.9 mg/dL (ref 2.5–4.6)
Potassium: 3.8 mmol/L (ref 3.5–5.1)
Sodium: 133 mmol/L — ABNORMAL LOW (ref 135–145)

## 2015-08-20 LAB — CBC
HCT: 33.9 % — ABNORMAL LOW (ref 39.0–52.0)
Hemoglobin: 10.6 g/dL — ABNORMAL LOW (ref 13.0–17.0)
MCH: 26.2 pg (ref 26.0–34.0)
MCHC: 31.3 g/dL (ref 30.0–36.0)
MCV: 83.9 fL (ref 78.0–100.0)
Platelets: 160 10*3/uL (ref 150–400)
RBC: 4.04 MIL/uL — ABNORMAL LOW (ref 4.22–5.81)
RDW: 15.4 % (ref 11.5–15.5)
WBC: 2.7 10*3/uL — ABNORMAL LOW (ref 4.0–10.5)

## 2015-08-20 MED ORDER — HYDRALAZINE HCL 50 MG PO TABS
50.0000 mg | ORAL_TABLET | Freq: Three times a day (TID) | ORAL | Status: DC
  Administered 2015-08-20 – 2015-08-26 (×16): 50 mg via ORAL
  Filled 2015-08-20 (×17): qty 1

## 2015-08-20 MED ORDER — BOOST / RESOURCE BREEZE PO LIQD: 1.0000 | Freq: Two times a day (BID) | ORAL | Status: DC

## 2015-08-20 MED ORDER — ASPIRIN 81 MG PO TBEC: 81.0000 mg | DELAYED_RELEASE_TABLET | Freq: Every day | ORAL | Status: AC

## 2015-08-20 MED ORDER — ZOLPIDEM TARTRATE 5 MG PO TABS
5.0000 mg | ORAL_TABLET | Freq: Every evening | ORAL | Status: DC | PRN
  Administered 2015-08-20 – 2015-08-25 (×5): 5 mg via ORAL
  Filled 2015-08-20 (×5): qty 1

## 2015-08-20 MED ORDER — ATORVASTATIN CALCIUM 40 MG PO TABS: 40.0000 mg | ORAL_TABLET | Freq: Every day | ORAL | Status: AC

## 2015-08-20 MED ORDER — SODIUM CHLORIDE 0.9 % IV SOLN: 100.0000 mL | INTRAVENOUS | Status: DC | PRN

## 2015-08-20 MED ORDER — VERAPAMIL HCL 120 MG PO TABS: 120.0000 mg | ORAL_TABLET | Freq: Three times a day (TID) | ORAL | Status: AC

## 2015-08-20 NOTE — Clinical Social Work Note (Signed)
CSW received consult for substance abuse treatment programs for patient. Met with patient and he expressed being tired of using drugs and wanting help.  CSW provided patient with inpatient and outpatient treatment programs. Also talked with patient that his dialysis treatment may impact his being able to be accepted by an inpatient program, however encouraged patient to call both to determine who can accept him. Patient also informed that the list also informs him of what type insurance each program accepts and reminded patient that he has Medicare and Medicaid. When asked, patient had no questions of CSW at this time. Patient also provided with CSW's name and contact information if he has any questions.  CSW will continue to follow through discharge.   Almalik Weissberg [name], MSW, LCSW Licensed Clinical Social Worker [place] [phone]

## 2015-08-20 NOTE — Progress Notes (Signed)
08/20/2015 1700 Pt. Resting comfortably in bed at this time. No issues with combativeness or trying to get OOB during day shift. Posey belt loosened from bed, but kept around waist in case need arises or patient becomes combative. Bed alarm in place. Restraint discontinuation criteria explained to patient. Pt. Verbalized understanding at this time.  Will continue to closely monitor patient.  Gianno Volner, Arville Lime

## 2015-08-20 NOTE — Progress Notes (Signed)
Late entry: 5/2  2200 Pt returned from hemo, alert to self but disoriented to place, time and situation.Attempting to get out of bed wanting to go home.  Notified MD   Of actions and requesting posey restraints.No safety sitters available at this time

## 2015-08-20 NOTE — Progress Notes (Addendum)
[facility]                                                                              Patient Demographics  [name], is a 60 y.o. male, DOB - [birth year], [id number]  Admit date - 08/16/2015   Admitting Physician Vianne Bulls, MD  Outpatient Primary MD for the patient is No primary care provider on file.  Outpatient specialists:   LOS - 3  days    Chief Complaint  Patient presents with  . Code STEMI       Brief summary   Patient is a 60 year old male with ESRD on HD, polysubstance abuse, hypertension, seizure disorder presented to the ED from his dialysis center with severe acute hypertension, chest pain, and generalized weakness. Patient reported he had been using crack cocaine daily for 5-7 days. He went to his dialysis center for his regularly scheduled session and completed it without incident. Upon completion he was noted to be hypertensive to the 230s/140s with chest pain. He was directed to the emergency department.  Upon arrival to the ED blood pressure was 236/149. EKG revealed sinus rhythm with LVH by voltage criteria and T-wave inversions in the inferolateral leads. Initial troponin was 0.13. Nitroglycerin drip was initiated. Cardiology was consulted. Urine drug change showed cocaine positive Patient was transferred to the floor on 08/19/15   Assessment & Plan    Chest pain With elevated troponins likely due to Demand ischemia from hypertensive emergency -Patient was admitted to stepdown unit, was placed on hydralazine, losartan and nitro drip  - Avoid beta blockers due to cocaine use  - Cardiology was consulted, recommended no further interventions or ischemia workup.  - 2-D echo showed EF of 55-60% with grade 1 diastolic dysfunction. Patient was strongly counseled to quit cocaine  Hypertensive emergency : BP currently improving -HTN rebounded 4/30 w/ attempts to wean off nitro gtt  - Increased hydralazine to 50 mg q8hours, continue  losartanand verapamil - encouraged medication compliance and cocaine abstinence. Patient not interested.   ESRD on T/Th/Sat HD  - Nephrology consulted, patient underwent hemodialysis per his scheduled on 5/2, next in a.m.  Cocaine / Polysubstance abuse - Patient was strongly counseled to quit cocaine - Patient disinterested in discontinuing EtOH or Cocaine. -Continue CIWA protocol  Seizure disorder  -Continue Keppra 1000 mg daily  Anemia of CKD  -no sign of active blood loss   Code Status: DO NOT RESUSCITATE  Family Communication: Discussed in detail with the patient, all imaging results, lab results explained to the patient    Disposition Plan: PT evaluation pending  Time Spent in minutes  25 minutes  Procedures  Hemodialysis  Consults   Nephrology  DVT Prophylaxis   SCD's  Medications  Scheduled Meds: . aspirin EC  81 mg Oral Daily  . atorvastatin  40 mg Oral q1800  . calcium acetate  2,001 mg Oral TID WC  . folic acid  1 mg Oral Daily  . heparin  5,000 Units Subcutaneous Q8H  . hydrALAZINE  50 mg Oral Q8H  . levETIRAcetam  1,000 mg Oral Daily  . losartan  100 mg Oral  QPM  . multivitamin with minerals  1 tablet Oral Daily  . thiamine  100 mg Oral Daily  . verapamil  120 mg Oral Q8H   Continuous Infusions: . nitroGLYCERIN Stopped (08/19/15 0720)   PRN Meds:.sodium chloride, sodium chloride, sodium chloride, sodium chloride, acetaminophen, alteplase, heparin, lidocaine (PF), lidocaine-prilocaine, LORazepam, ondansetron (ZOFRAN) IV, pentafluoroprop-tetrafluoroeth   Antibiotics   Anti-infectives    None        Subjective:   [name] was seen and examined today.  Patient seen and examined, feeling weak, does not feel ready today, wants more food. Patient denies dizziness, chest pain, shortness of breath, abdominal pain, N/V/D/C, new weakness, numbess, tingling. No acute events overnight.  One BP overnight was elevated otherwise  improving.  Objective:   Filed Vitals:   08/19/15 2015 08/19/15 2226 08/20/15 0609 08/20/15 0848  BP: 137/61 217/81 158/74 130/67  Pulse: 83 65 78 80  Temp: 97 F (36.1 C) 98.3 F (36.8 C) 98.7 F (37.1 C) 98.6 F (37 C)  TempSrc: Oral Oral Oral Oral  Resp: 24 16 18 18   Height:      Weight: 54.3 kg (119 lb 11.4 oz)     SpO2:  99% 100% 100%    Intake/Output Summary (Last 24 hours) at 08/20/15 1149 Last data filed at 08/20/15 1122  Gross per 24 hour  Intake    720 ml  Output   2000 ml  Net  -1280 ml     Wt Readings from Last 3 Encounters:  08/19/15 54.3 kg (119 lb 11.4 oz)  06/20/15 60.3 kg (132 lb 15 oz)  04/12/15 53 kg (116 lb 13.5 oz)     Exam  General: Alert and oriented x 3, NAD  HEENT:    Neck:   CVS: S1 S2 auscultated, no rubs, murmurs or gallops. Regular rate and rhythm.  Respiratory: Clear to auscultation bilaterally, no wheezing, rales or rhonchi  Abdomen: Soft, nontender, nondistended, + bowel sounds  Ext: no cyanosis clubbing or edema  Neuro: AAOx3, Cr N's II- XII. Strength 5/5 upper and lower extremities bilaterally  Skin: No rashes  Psych: Normal affect and demeanor, alert and oriented x3    Data Reviewed:  I have personally reviewed following labs and imaging studies  Micro Results Recent Results (from the past 240 hour(s))  MRSA PCR Screening     Status: None   Collection Time: 08/16/15 10:13 PM  Result Value Ref Range Status   MRSA by PCR NEGATIVE NEGATIVE Final    Comment:        The GeneXpert MRSA Assay (FDA approved for NASAL specimens only), is one component of a comprehensive MRSA colonization surveillance program. It is not intended to diagnose MRSA infection nor to guide or monitor treatment for MRSA infections.     Radiology Reports Dg Chest Portable 1 View  08/16/2015  CLINICAL DATA:  Patient from dialysis.  Five day crack cocaine binge EXAM: PORTABLE CHEST 1 VIEW COMPARISON:  3/2/ 17 FINDINGS: Mild cardiac  enlargement. No pleural effusion or edema. No airspace consolidation. Degenerative changes are noted within both glenohumeral joints. IMPRESSION: 1. No acute cardiopulmonary abnormalities. Electronically Signed   By: Kerby Moors M.D.   On: 08/16/2015 19:04    CBC  Recent Labs Lab 08/16/15 1830 08/18/15 0328 08/19/15 1633  WBC 4.0 3.9* 3.4*  HGB 12.2* 9.7* 9.8*  HCT 38.8* 31.1* 31.1*  PLT 162 137* 147*  MCV 85.7 85.4 83.6  MCH 26.9 26.6 26.3  MCHC 31.4 31.2 31.5  RDW 15.6* 15.6* 15.4  LYMPHSABS 0.9  --   --   MONOABS 0.5  --   --   EOSABS 0.2  --   --   BASOSABS 0.0  --   --     Chemistries   Recent Labs Lab 08/16/15 1830 08/18/15 0328 08/19/15 1633  NA 132* 134* 132*  K 4.0 4.3 4.3  CL 96* 97* 98*  CO2 25 26 24   GLUCOSE 92 119* 86  BUN 11 23* 45*  CREATININE 6.02* 8.70* 10.19*  CALCIUM 8.2* 8.3* 8.4*  AST 17  --   --   ALT 8*  --   --   ALKPHOS 65  --   --   BILITOT 0.6  --   --    ------------------------------------------------------------------------------------------------------------------ estimated creatinine clearance is 6.1 mL/min (by C-G formula based on Cr of 10.19). ------------------------------------------------------------------------------------------------------------------ No results for input(s): HGBA1C in the last 72 hours. ------------------------------------------------------------------------------------------------------------------ No results for input(s): CHOL, HDL, LDLCALC, TRIG, CHOLHDL, LDLDIRECT in the last 72 hours. ------------------------------------------------------------------------------------------------------------------ No results for input(s): TSH, T4TOTAL, T3FREE, THYROIDAB in the last 72 hours.  Invalid input(s): FREET3 ------------------------------------------------------------------------------------------------------------------ No results for input(s): VITAMINB12, FOLATE, FERRITIN, TIBC, IRON, RETICCTPCT in the  last 72 hours.  Coagulation profile  Recent Labs Lab 08/16/15 1830  INR 0.96    No results for input(s): DDIMER in the last 72 hours.  Cardiac Enzymes  Recent Labs Lab 08/16/15 1830 08/16/15 2256 08/17/15 0437  TROPONINI 0.13* 0.11* 0.10*   ------------------------------------------------------------------------------------------------------------------ Invalid input(s): POCBNP   Recent Labs  08/19/15 2221  GLUCAP 68     [name] M.D. [facility] 08/20/2015, 11:49 AM  Pager: [phone] Between 7am to 7pm - call Pager - [phone]  After 7pm go to [email or web address] - password [email or web address]  Call night coverage person covering after 7pm

## 2015-08-20 NOTE — Care Management Important Message (Signed)
Important Message  Patient Details  Name: [name] MRN: [id number] Date of Birth: 11-13-1955   Medicare Important Message Given:  Yes    [name] May 08/20/2015, 12:06 PM

## 2015-08-20 NOTE — Progress Notes (Signed)
[facility] Progress Note    Subjective: "I want some food...all they give me is french toast" Denies chest pain/SOB.   Objective Filed Vitals:   08/19/15 2015 08/19/15 2226 08/20/15 0609 08/20/15 0848  BP: 137/61 217/81 158/74 130/67  Pulse: 83 65 78 80  Temp: 97 F (36.1 C) 98.3 F (36.8 C) 98.7 F (37.1 C) 98.6 F (37 C)  TempSrc: Oral Oral Oral Oral  Resp: 24 16 18 18   Height:      Weight: 54.3 kg (119 lb 11.4 oz)     SpO2:  99% 100% 100%   Physical Exam General: Slightly lethargic, cooperative Heart: S1,S2, RRR No R/G/M Lungs: BBS CTA Abdomen: active bs nontender Extremities: No LE edema  Dialysis Access: LUA AVF + bruit  Additional Objective Labs: Basic Metabolic Panel:  Recent Labs Lab 08/16/15 1830 08/18/15 0328 08/19/15 1633  NA 132* 134* 132*  K 4.0 4.3 4.3  CL 96* 97* 98*  CO2 25 26 24   GLUCOSE 92 119* 86  BUN 11 23* 45*  CREATININE 6.02* 8.70* 10.19*  CALCIUM 8.2* 8.3* 8.4*  PHOS  --   --  4.6   Liver Function Tests:  Recent Labs Lab 08/16/15 1830 08/19/15 1633  AST 17  --   ALT 8*  --   ALKPHOS 65  --   BILITOT 0.6  --   PROT 7.2  --   ALBUMIN 2.9* 2.4*   No results for input(s): LIPASE, AMYLASE in the last 168 hours. CBC:  Recent Labs Lab 08/16/15 1830 08/18/15 0328 08/19/15 1633  WBC 4.0 3.9* 3.4*  NEUTROABS 2.4  --   --   HGB 12.2* 9.7* 9.8*  HCT 38.8* 31.1* 31.1*  MCV 85.7 85.4 83.6  PLT 162 137* 147*   Blood Culture    Component Value Date/Time   SDES BLOOD RIGHT FOREARM 04/21/2014 0700   SPECREQUEST BOTTLES DRAWN AEROBIC AND ANAEROBIC 10CC EA 04/21/2014 0700   CULT  04/21/2014 0700    NO GROWTH 5 DAYS Performed at [place] 04/27/2014 FINAL 04/21/2014 0700    Cardiac Enzymes:  Recent Labs Lab 08/16/15 1830 08/16/15 2256 08/17/15 0437  TROPONINI 0.13* 0.11* 0.10*   CBG:  Recent Labs Lab 08/19/15 2221  GLUCAP 80   Iron Studies: No results for input(s):  IRON, TIBC, TRANSFERRIN, FERRITIN in the last 72 hours. @lablastinr3 @ Studies/Results: No results found. Medications: . nitroGLYCERIN Stopped (08/19/15 0720)   . aspirin EC  81 mg Oral Daily  . atorvastatin  40 mg Oral q1800  . calcium acetate  2,001 mg Oral TID WC  . folic acid  1 mg Oral Daily  . heparin  5,000 Units Subcutaneous Q8H  . hydrALAZINE  50 mg Oral Q8H  . levETIRAcetam  1,000 mg Oral Daily  . losartan  100 mg Oral QPM  . multivitamin with minerals  1 tablet Oral Daily  . thiamine  100 mg Oral Daily  . verapamil  120 mg Oral Q8H    Dialysis Orders:  [place] TTS BFR 400, DFR Autoflow 1.5 EDW 55 (kg),  3.0 K, 2.25 Ca,  UFR Profile: Profile 2 LUA AVF Hectoral 7 mcg IV q treatment  Assessment/Plan: 1. Hypertensive emergency d/t cocaine abuse: Per primary. Off NTG drip. Avoiding BB. BP controlled.  2. Chest Pain: Troponin 0.13, 0.11, 0.10. Cardiology consulted. Demand ischemia d/t cocaine abuse. Echo 05/03 showed EF 55-60%. Mild concentric LVH. Cardiology signed off. No further CP. 3. ESRD - TTS @  [facility] HD 08/19/15 Next tx tomorrow. K+ 4.3 No heparin.  4. Hypertension/volume - Has been started on losartan, hydralazine and verapamil. NTG drip Dc'd 08/19/15. HD 08/19/15 Pre wt 56 Net UF 2000 Post wt 54.3. Is under OP EDW. Will lower EDW on DC. 0.5-1 liter UFG tomorrow.  5. Anemia - HGB 9.7. Not on OP ESA, last in-center HGB 13.2 (08/07/15). Will give aranesp 40 mcg IV today.  6. Metabolic bone disease - Ca 8.3 C Ca 9.18 Last in-center Phos 9.2 (08/01/15). Cont VDRA, binders 7. Nutrition - Albumin 2.9. Renal diet, renal vit/supplement.  8. Seizure disorder: Per primary. Cont keppra 9. Polysubstance abuse: on CIWA protocol per primary. Pt requesting referral for substance abuse treatment program.  Have d/w SW, they do not do referral for inpatient, they will give him program names to call.    Rita H. Owens Shark, NP-C 08/19/2015, 12:52 PM  [facility] Bee[facility] [phone]  Pt seen, examined, agree w assess/plan as above with additions as indicated. Still confused. HD tomorrow.  Kelly Splinter MD [employer] pager [phone]    cell [phone] 08/20/2015, 2:22 PM

## 2015-08-20 NOTE — Progress Notes (Signed)
Initial Nutrition Assessment  DOCUMENTATION CODES:   Severe malnutrition in context of chronic illness, Underweight  INTERVENTION:  Provide Boost Breeze po BID, each supplement provides 250 kcal and 9 grams of protein  Encourage adequate PO intake.  NUTRITION DIAGNOSIS:   Malnutrition related to chronic illness as evidenced by severe depletion of body fat, severe depletion of muscle mass.  GOAL:   Patient will meet greater than or equal to 90% of their needs  MONITOR:   PO intake, Supplement acceptance, Weight trends, Labs, I & O's  REASON FOR ASSESSMENT:    (Low BMI)    ASSESSMENT:   60 y.o. male with ESRD on hemodialysis TTS at [facility]. PMH significant for hypertensive, hepatitis C, hepatitis B core antibody positive, GI bleed, Chronic diastolic HF, medical noncompliance, seizure disorder, cocaine abuse. He presented to ED 08/16/15 with hypertensive emergency, weakness and chest pain. Patient admitted that he has been doing crack for last 5-7 days. Did attend HD 08/16/15 but was found to be hypertensive post HD and was sent to ED for evaluation.   Meal completion has been 100%. Pt reports appetite has improved. He reports poor po intake for 1 week PTA due to abdominal pains. He reports he has mostly been consuming fluids. Usual body weight reported to be ~145 lbs. Pt with a 19.5% weight loss in 4 months. Weight loss may be related to fluids. During time of visit, pt with no abdominal discomfort. Noted, a Nepro Shake opened at bedside. Pt reports dislike of Nepro. RD to order Boost Breeze instead.  Nutrition-Focused physical exam completed. Findings are severe fat depletion, severe muscle depletion, and no edema.   Labs and medications reviewed.   Diet Order:  Diet renal with fluid restriction Fluid restriction:: 1500 mL Fluid; Room service appropriate?: Yes; Fluid consistency:: Thin  Skin:  Reviewed, no issues  Last BM:  5/1  Height:   Ht  Readings from Last 1 Encounters:  08/16/15 5\' 8"  (1.727 m)    Weight:   Wt Readings from Last 1 Encounters:  08/19/15 119 lb 11.4 oz (54.3 kg)    Ideal Body Weight:  70 kg  BMI:  Body mass index is 18.21 kg/(m^2).  Estimated Nutritional Needs:   Kcal:  1800-2000  Protein:  85-95 grams  Fluid:  1.5 L/day  EDUCATION NEEDS:   No education needs identified at this time  [name] Parker, MS, RD, LDN Pager # [phone] After hours/ weekend pager # [phone]

## 2015-08-21 LAB — CBC
HCT: 31.4 % — ABNORMAL LOW (ref 39.0–52.0)
Hemoglobin: 9.4 g/dL — ABNORMAL LOW (ref 13.0–17.0)
MCH: 25.9 pg — ABNORMAL LOW (ref 26.0–34.0)
MCHC: 29.9 g/dL — ABNORMAL LOW (ref 30.0–36.0)
MCV: 86.5 fL (ref 78.0–100.0)
Platelets: 140 10*3/uL — ABNORMAL LOW (ref 150–400)
RBC: 3.63 MIL/uL — ABNORMAL LOW (ref 4.22–5.81)
RDW: 15.7 % — ABNORMAL HIGH (ref 11.5–15.5)
WBC: 4.9 10*3/uL (ref 4.0–10.5)

## 2015-08-21 LAB — RENAL FUNCTION PANEL
Albumin: 2.6 g/dL — ABNORMAL LOW (ref 3.5–5.0)
Anion gap: 13 (ref 5–15)
BUN: 51 mg/dL — ABNORMAL HIGH (ref 6–20)
CO2: 25 mmol/L (ref 22–32)
Calcium: 8.5 mg/dL — ABNORMAL LOW (ref 8.9–10.3)
Chloride: 98 mmol/L — ABNORMAL LOW (ref 101–111)
Creatinine, Ser: 7.81 mg/dL — ABNORMAL HIGH (ref 0.61–1.24)
GFR calc Af Amer: 8 mL/min — ABNORMAL LOW (ref >60)
GFR calc non Af Amer: 7 mL/min — ABNORMAL LOW (ref >60)
Glucose, Bld: 114 mg/dL — ABNORMAL HIGH (ref 65–99)
Phosphorus: 1.8 mg/dL — ABNORMAL LOW (ref 2.5–4.6)
Potassium: 3.8 mmol/L (ref 3.5–5.1)
Sodium: 136 mmol/L (ref 135–145)

## 2015-08-21 MED ORDER — POLYETHYLENE GLYCOL 3350 17 G PO PACK: 17.0000 g | PACK | Freq: Every day | ORAL | Status: DC | PRN

## 2015-08-21 MED ORDER — POLYETHYLENE GLYCOL 3350 17 G PO PACK
17.0000 g | PACK | Freq: Every day | ORAL | Status: DC | PRN
  Administered 2015-08-21: 17 g via ORAL
  Filled 2015-08-21: qty 1

## 2015-08-21 MED ORDER — POLYETHYLENE GLYCOL 3350 17 G PO PACK
17.0000 g | PACK | Freq: Once | ORAL | Status: AC
  Administered 2015-08-21: 17 g via ORAL
  Filled 2015-08-21: qty 1

## 2015-08-21 MED ORDER — SENNOSIDES-DOCUSATE SODIUM 8.6-50 MG PO TABS: 1.0000 | ORAL_TABLET | Freq: Two times a day (BID) | ORAL | Status: AC

## 2015-08-21 MED ORDER — SENNOSIDES-DOCUSATE SODIUM 8.6-50 MG PO TABS
1.0000 | ORAL_TABLET | Freq: Two times a day (BID) | ORAL | Status: DC
  Administered 2015-08-21 – 2015-08-26 (×12): 1 via ORAL
  Filled 2015-08-21 (×13): qty 1

## 2015-08-21 MED ORDER — HYDRALAZINE HCL 50 MG PO TABS: 50.0000 mg | ORAL_TABLET | Freq: Three times a day (TID) | ORAL | Status: DC

## 2015-08-21 NOTE — Progress Notes (Signed)
PT Cancellation Note  Patient Details Name: [name] MRN: [id number] DOB: February 03, 1956   Cancelled Treatment:    Reason Eval/Treat Not Completed: Fatigue/lethargy limiting ability to participate;Patient declined, no reason specified.  Will return at later date.   Despina Pole 08/21/2015, 1:29 PM Carita Pian Sanjuana Kava, [place] Pager [phone]

## 2015-08-21 NOTE — Progress Notes (Signed)
08/21/2015  6:32 PM  Pt came back from HD lethargic but arousable.  Pt states he didn't sleep much last night.  At this time, pt is refusing his medications.  BP also significantly elevated at 190/62.  Pt missed his 2pm dose of BP med and has another dose due tonight at ten.  Pt currently not wanting to take anything.  Dr. Tana Coast made aware.  Will monitor. Princella Pellegrini

## 2015-08-21 NOTE — Progress Notes (Signed)
Patient complaining of constipation this morning and requesting medication.  Notified Baltazar Najjar, NP.  Will continue to monitor.

## 2015-08-21 NOTE — Progress Notes (Signed)
PT Cancellation Note  Patient Details Name: [name] MRN: [id number] DOB: 1955-07-16   Cancelled Treatment:    Reason Eval/Treat Not Completed: Fatigue/lethargy limiting ability to participate.  Patient declined due to fatigue/sleeping.  Will return later today for PT evaluation.   Despina Pole 08/21/2015, 10:39 AM Carita Pian. Sanjuana Kava, [place] Pager [phone]

## 2015-08-21 NOTE — Progress Notes (Signed)
Subjective:  Eating brk , no cos, for hd today on schedule  Objective Vital signs in last 24 hours: Filed Vitals:   08/20/15 1711 08/20/15 1809 08/20/15 2009 08/21/15 0442  BP: 131/50 140/70 133/59 144/53  Pulse: 79 75 83 81  Temp:  98.6 F (37 C) 98.8 F (37.1 C) 98.4 F (36.9 C)  TempSrc:  Oral Oral Oral  Resp:  18 17 18   Height:      Weight:   54.4 kg (119 lb 14.9 oz)   SpO2:  100% 97% 97%   Weight change: -1.6 kg (-3 lb 8.4 oz) Physical Exam General: alert /  OX3 / cooperative  Heart: RRR No R/G/M Lungs: CTA Abdomen: soft , Nondistend / nontender Extremities: No pedal  edema  Dialysis Access: LUA AVF + bruit    Dialysis Orders:  [place] TTS BFR 400, DFR Autoflow 1.5 EDW 55 (kg),  3.0 K, 2.25 Ca,  UFR Profile: Profile 2 LUA AVF Hectoral 7 mcg IV q treatment  Problem/Plan 1. Hypertensive emergency d/t cocaine abuse:  bp now controlled with HD/  and bp meds.  2. Chest Pain: Troponin 0.13, 0.11, 0.10. No further CP/ Cardiology consulted= Demand ischemia d/t cocaine abuse. Echo 05/03 showed EF 55-60%. Mild concentric LVH.  3. ESRD - TTS @ [facility] HD  Now on schedule  Hd today. K+ 3.8 yest /No heparin.  4. Hypertension/volume - Has been started on losartan, hydralazine and verapamil. NTG drip Dc'd 08/19/15. Under edw /Will lower EDW on DC 5. Anemia - HGB 9.7. >10.6 Not on OP ESA, last in-center HGB 13.2 (08/07/15).  aranesp 40 mcg IV yest wed  6. Metabolic bone disease - Ca /phos stable now in hosp/ Cont VDRA, binders 7. Nutrition - Albumin 2.9. Renal diet, renal vit/supplement.  8. Seizure disorder: Per primary. Cont keppra 9. Polysubstance abuse: on CIWA protocol per primary   Ernest Haber, PA-C [place] [phone] 08/21/2015,7:58 AM  LOS: 4 days   Pt seen, examined, agree w assess/plan as above.  Kelly Splinter MD [facility] pager [phone]    cell [phone] 08/21/2015, 12:33 PM     Labs: Basic Metabolic  Panel:  Recent Labs Lab 08/18/15 0328 08/19/15 1633 08/20/15 1247  NA 134* 132* 133*  K 4.3 4.3 3.8  CL 97* 98* 96*  CO2 26 24 27   GLUCOSE 119* 86 127*  BUN 23* 45* 26*  CREATININE 8.70* 10.19* 6.16*  CALCIUM 8.3* 8.4* 8.7*  PHOS  --  4.6 2.9   Liver Function Tests:  Recent Labs Lab 08/16/15 1830 08/19/15 1633 08/20/15 1247  AST 17  --   --   ALT 8*  --   --   ALKPHOS 65  --   --   BILITOT 0.6  --   --   PROT 7.2  --   --   ALBUMIN 2.9* 2.4* 2.6*   No results for input(s): LIPASE, AMYLASE in the last 168 hours. No results for input(s): AMMONIA in the last 168 hours. CBC:  Recent Labs Lab 08/16/15 1830 08/18/15 0328 08/19/15 1633 08/20/15 1247  WBC 4.0 3.9* 3.4* 2.7*  NEUTROABS 2.4  --   --   --   HGB 12.2* 9.7* 9.8* 10.6*  HCT 38.8* 31.1* 31.1* 33.9*  MCV 85.7 85.4 83.6 83.9  PLT 162 137* 147* 160   Cardiac Enzymes:  Recent Labs Lab 08/16/15 1830 08/16/15 2256 08/17/15 0437  TROPONINI 0.13* 0.11* 0.10*   CBG:  Recent Labs Lab 08/19/15  [place]    Studies/Results: No results found. Medications: . nitroGLYCERIN Stopped (08/19/15 0720)   . aspirin EC  81 mg Oral Daily  . atorvastatin  40 mg Oral q1800  . calcium acetate  2,001 mg Oral TID WC  . feeding supplement  1 Container Oral BID BM  . folic acid  1 mg Oral Daily  . heparin  5,000 Units Subcutaneous Q8H  . hydrALAZINE  50 mg Oral Q8H  . levETIRAcetam  1,000 mg Oral Daily  . losartan  100 mg Oral QPM  . multivitamin with minerals  1 tablet Oral Daily  . senna-docusate  1 tablet Oral BID  . thiamine  100 mg Oral Daily  . verapamil  120 mg Oral Q8H

## 2015-08-21 NOTE — Progress Notes (Signed)
[facility]                                                                              Patient Demographics  [name], is a 60 y.o. male, DOB - Mar 17, 1956, ZO:[id number]  Admit date - 08/16/2015   Admitting Physician Vianne Bulls, MD  Outpatient Primary MD for the patient is No primary care provider on file.  Outpatient specialists:   LOS - 4  days    Chief Complaint  Patient presents with  . Code STEMI       Brief summary   Patient is a 60 year old male with ESRD on HD, polysubstance abuse, hypertension, seizure disorder presented to the ED from his dialysis center with severe acute hypertension, chest pain, and generalized weakness. Patient reported he had been using crack cocaine daily for 5-7 days. He went to his dialysis center for his regularly scheduled session and completed it without incident. Upon completion he was noted to be hypertensive to the 230s/140s with chest pain. He was directed to the emergency department.  Upon arrival to the ED blood pressure was 236/149. EKG revealed sinus rhythm with LVH by voltage criteria and T-wave inversions in the inferolateral leads. Initial troponin was 0.13. Nitroglycerin drip was initiated. Cardiology was consulted. Urine drug change showed cocaine positive Patient was transferred to the floor on 08/19/15   Assessment & Plan    Chest pain With elevated troponins likely due to Demand ischemia from hypertensive emergency -Patient was admitted to stepdown unit, was placed on hydralazine, losartan and nitro drip  - Avoid beta blockers due to cocaine use  - Cardiology was consulted, recommended no further interventions or ischemia workup.  - 2-D echo showed EF of 55-60% with grade 1 diastolic dysfunction. Patient was strongly counseled to quit cocaine  Hypertensive emergency : BP currently improving -HTN rebounded 4/30 with attempts to wean off nitro gtt  - Increased hydralazine to 50 mg q8hours, continue  losartanand verapamil - encouraged medication compliance and cocaine abstinence. Patient is not interested.   ESRD on T/Th/Sat HD  - Nephrology consulted, patient underwent hemodialysis per his scheduled on 5/2, HD planned today  Cocaine / Polysubstance abuse - Patient was strongly counseled to quit cocaine - Patient disinterested in discontinuing EtOH or Cocaine. -Continue CIWA protocol  Constipation - Placed on Senokot, MiraLAX and tap water enema if no BM with stool softener/laxative   Seizure disorder  -Continue Keppra 1000 mg daily  Anemia of CKD  -no sign of active blood loss   Code Status: DO NOT RESUSCITATE  Family Communication: Discussed in detail with the patient, all imaging results, lab results explained to the patient    Disposition Plan: PT evaluation pending, possible DC later this evening or in am  Time Spent in minutes  25 minutes  Procedures  Hemodialysis  Consults   Nephrology  DVT Prophylaxis   SCD's  Medications  Scheduled Meds: . aspirin EC  81 mg Oral Daily  . atorvastatin  40 mg Oral q1800  . calcium acetate  2,001 mg Oral TID WC  . feeding supplement  1 Container Oral BID BM  . folic  acid  1 mg Oral Daily  . heparin  5,000 Units Subcutaneous Q8H  . hydrALAZINE  50 mg Oral Q8H  . levETIRAcetam  1,000 mg Oral Daily  . losartan  100 mg Oral QPM  . multivitamin with minerals  1 tablet Oral Daily  . polyethylene glycol  17 g Oral Once  . senna-docusate  1 tablet Oral BID  . thiamine  100 mg Oral Daily  . verapamil  120 mg Oral Q8H   Continuous Infusions:   PRN Meds:.sodium chloride, sodium chloride, sodium chloride, sodium chloride, acetaminophen, alteplase, heparin, lidocaine (PF), lidocaine-prilocaine, LORazepam, ondansetron (ZOFRAN) IV, pentafluoroprop-tetrafluoroeth, zolpidem   Antibiotics   Anti-infectives    None        Subjective:   [name] was seen and examined today. States that he feels "blocked up"  constipated, weak, "I am not ready to go home". Hemodialysis pending today. Patient denies dizziness, chest pain, shortness of breath, abdominal pain, N/V. numbess, tingling. BP is improving, no fevers or chills. Not interested in talking, states he's too fatigued.  Objective:   Filed Vitals:   08/20/15 1809 08/20/15 2009 08/21/15 0442 08/21/15 0850  BP: 140/70 133/59 144/53 165/59  Pulse: 75 83 81 79  Temp: 98.6 F (37 C) 98.8 F (37.1 C) 98.4 F (36.9 C) 98.6 F (37 C)  TempSrc: Oral Oral Oral Oral  Resp: 18 17 18 18   Height:      Weight:  54.4 kg (119 lb 14.9 oz)    SpO2: 100% 97% 97% 96%    Intake/Output Summary (Last 24 hours) at 08/21/15 1137 Last data filed at 08/21/15 0850  Gross per 24 hour  Intake   1320 ml  Output      0 ml  Net   1320 ml     Wt Readings from Last 3 Encounters:  08/20/15 54.4 kg (119 lb 14.9 oz)  06/20/15 60.3 kg (132 lb 15 oz)  04/12/15 53 kg (116 lb 13.5 oz)     Exam  General: Alert and oriented x 3, NAD, irritable   HEENT:    Neck:   CVS: S1 S2 clear, RRR  Respiratory: CTAB  Abdomen: Soft, nontender, nondistended, + bowel sounds  Ext: no cyanosis clubbing or edema  Neuro:   Skin: No rashes  Psych: irritable   Data Reviewed:  I have personally reviewed following labs and imaging studies  Micro Results Recent Results (from the past 240 hour(s))  MRSA PCR Screening     Status: None   Collection Time: 08/16/15 10:13 PM  Result Value Ref Range Status   MRSA by PCR NEGATIVE NEGATIVE Final    Comment:        The GeneXpert MRSA Assay (FDA approved for NASAL specimens only), is one component of a comprehensive MRSA colonization surveillance program. It is not intended to diagnose MRSA infection nor to guide or monitor treatment for MRSA infections.     Radiology Reports Dg Chest Portable 1 View  08/16/2015  CLINICAL DATA:  Patient from dialysis.  Five day crack cocaine binge EXAM: PORTABLE CHEST 1 VIEW COMPARISON:   3/2/ 17 FINDINGS: Mild cardiac enlargement. No pleural effusion or edema. No airspace consolidation. Degenerative changes are noted within both glenohumeral joints. IMPRESSION: 1. No acute cardiopulmonary abnormalities. Electronically Signed   By: Kerby Moors M.D.   On: 08/16/2015 19:04    CBC  Recent Labs Lab 08/16/15 1830 08/18/15 0328 08/19/15 1633 08/20/15 1247 08/21/15 1043  WBC 4.0 3.9* 3.4* 2.7* 4.9  HGB 12.2* 9.7* 9.8* 10.6* 9.4*  HCT 38.8* 31.1* 31.1* 33.9* 31.4*  PLT 162 137* 147* 160 140*  MCV 85.7 85.4 83.6 83.9 86.5  MCH 26.9 26.6 26.3 26.2 25.9*  MCHC 31.4 31.2 31.5 31.3 29.9*  RDW 15.6* 15.6* 15.4 15.4 15.7*  LYMPHSABS 0.9  --   --   --   --   MONOABS 0.5  --   --   --   --   EOSABS 0.2  --   --   --   --   BASOSABS 0.0  --   --   --   --     Chemistries   Recent Labs Lab 08/16/15 1830 08/18/15 0328 08/19/15 1633 08/20/15 1247  NA 132* 134* 132* 133*  K 4.0 4.3 4.3 3.8  CL 96* 97* 98* 96*  CO2 25 26 24 27   GLUCOSE 92 119* 86 127*  BUN 11 23* 45* 26*  CREATININE 6.02* 8.70* 10.19* 6.16*  CALCIUM 8.2* 8.3* 8.4* 8.7*  AST 17  --   --   --   ALT 8*  --   --   --   ALKPHOS 65  --   --   --   BILITOT 0.6  --   --   --    ------------------------------------------------------------------------------------------------------------------ estimated creatinine clearance is 10.1 mL/min (by C-G formula based on Cr of 6.16). ------------------------------------------------------------------------------------------------------------------ No results for input(s): HGBA1C in the last 72 hours. ------------------------------------------------------------------------------------------------------------------ No results for input(s): CHOL, HDL, LDLCALC, TRIG, CHOLHDL, LDLDIRECT in the last 72 hours. ------------------------------------------------------------------------------------------------------------------ No results for input(s): TSH, T4TOTAL, T3FREE,  THYROIDAB in the last 72 hours.  Invalid input(s): FREET3 ------------------------------------------------------------------------------------------------------------------ No results for input(s): VITAMINB12, FOLATE, FERRITIN, TIBC, IRON, RETICCTPCT in the last 72 hours.  Coagulation profile  Recent Labs Lab 08/16/15 1830  INR 0.96    No results for input(s): DDIMER in the last 72 hours.  Cardiac Enzymes  Recent Labs Lab 08/16/15 1830 08/16/15 2256 08/17/15 0437  TROPONINI 0.13* 0.11* 0.10*   ------------------------------------------------------------------------------------------------------------------ Invalid input(s): POCBNP   Recent Labs  08/19/15 2221  GLUCAP 35     Ziyan Hillmer M.D. [facility] 08/21/2015, 11:37 AM  Pager: [phone] Between 7am to 7pm - call Pager - [phone]  After 7pm go to [email or web address] - password [email or web address]  Call night coverage person covering after 7pm

## 2015-08-22 ENCOUNTER — Inpatient Hospital Stay (HOSPITAL_COMMUNITY): Payer: Medicare Other

## 2015-08-22 DIAGNOSIS — I10 Essential (primary) hypertension: Secondary | ICD-10-CM

## 2015-08-22 DIAGNOSIS — K5900 Constipation, unspecified: Secondary | ICD-10-CM

## 2015-08-22 DIAGNOSIS — F1020 Alcohol dependence, uncomplicated: Secondary | ICD-10-CM

## 2015-08-22 NOTE — Progress Notes (Signed)
[facility]                                                                              Patient Demographics  [name], is a 60 y.o. male, DOB - 1955/11/09, ZO:[id number]  Admit date - 08/16/2015   Admitting Physician Vianne Bulls, MD  Outpatient Primary MD for the patient is No primary care provider on file.  Outpatient specialists:   LOS - 5  days    Chief Complaint  Patient presents with  . Code STEMI       Brief summary   Patient is a 60 year old male with ESRD on HD, polysubstance abuse, hypertension, seizure disorder presented to the ED from his dialysis center with severe acute hypertension, chest pain, and generalized weakness. Patient reported he had been using crack cocaine daily for 5-7 days. He went to his dialysis center for his regularly scheduled session and completed it without incident. Upon completion he was noted to be hypertensive to the 230s/140s with chest pain. He was directed to the emergency department.  Upon arrival to the ED blood pressure was 236/149. EKG revealed sinus rhythm with LVH by voltage criteria and T-wave inversions in the inferolateral leads. Initial troponin was 0.13. Nitroglycerin drip was initiated. Cardiology was consulted. Urine drug change showed cocaine positive Patient was transferred to the floor on 08/19/15   Assessment & Plan    Chest pain With elevated troponins likely due to Demand ischemia from hypertensive emergency - Continue to avoid beta blockers due to cocaine use  - 2-D echo showed EF of 55-60% with grade 1 diastolic dysfunction. Patient was strongly counseled to quit cocaine -Hypertensive emergency : Optimize.   -ESRD on T/Th/Sat HD - HD in am. Likely DC after HD. Patient was not keen on being discharged back home today. -Cocaine / Polysubstance abuse -  Patient was strongly counseled to quit cocaine. Patient disinterested in discontinuing EtOH or Cocaine. Continue CIWA protocol -Constipation -  Placed on Senokot, MiraLAX and tap water enema if no BM with stool softener/laxative  -Seizure disorder - Continue Keppra 1000 mg daily - Anemia of CKD    Code Status: DO NOT RESUSCITATE  Disposition Plan: Patient was not keen on being discharged back home today.   Procedures  Hemodialysis  Consults   Nephrology  DVT Prophylaxis   SCD's  Medications  Scheduled Meds: . aspirin EC  81 mg Oral Daily  . atorvastatin  40 mg Oral q1800  . feeding supplement  1 Container Oral BID BM  . folic acid  1 mg Oral Daily  . heparin  5,000 Units Subcutaneous Q8H  . hydrALAZINE  50 mg Oral Q8H  . levETIRAcetam  1,000 mg Oral Daily  . losartan  100 mg Oral QPM  . multivitamin with minerals  1 tablet Oral Daily  . senna-docusate  1 tablet Oral BID  . thiamine  100 mg Oral Daily  . verapamil  120 mg Oral Q8H   Continuous Infusions:   PRN Meds:.sodium chloride, sodium chloride, acetaminophen, alteplase, heparin, lidocaine (PF), lidocaine-prilocaine, LORazepam, ondansetron (ZOFRAN) IV, pentafluoroprop-tetrafluoroeth, zolpidem   Antibiotics   Anti-infectives  None        Subjective:   [name] was seen and examined today. States that he feels "blocked up" constipated, weak, "I am not ready to go home". Hemodialysis pending today. Patient denies dizziness, chest pain, shortness of breath, abdominal pain, N/V. numbess, tingling. BP is improving, no fevers or chills. Not interested in talking, states he's too fatigued.  Objective:   Filed Vitals:   08/21/15 1940 08/22/15 0615 08/22/15 0810 08/22/15 1715  BP: 106/42 158/54 107/47 131/54  Pulse: 89 82 71 78  Temp: 98.5 F (36.9 C) 100.2 F (37.9 C) 99.6 F (37.6 C) 98.7 F (37.1 C)  TempSrc: Oral  Oral Oral  Resp: 19 18 17 18   Height:      Weight:      SpO2: 97% 100% 97% 97%    Intake/Output Summary (Last 24 hours) at 08/22/15 1813 Last data filed at 08/22/15 1400  Gross per 24 hour  Intake   1164 ml  Output       0 ml  Net   1164 ml     Wt Readings from Last 3 Encounters:  06/20/15 60.3 kg (132 lb 15 oz)  04/12/15 53 kg (116 lb 13.5 oz)  05/06/14 67.132 kg (148 lb)     Exam  General: Alert and oriented x 3, NAD, irritable   HEENT:    Neck:   CVS: S1 S2 clear, RRR  Respiratory: CTAB  Abdomen: Soft, nontender, nondistended, + bowel sounds  Ext: no cyanosis clubbing or edema  Neuro:   Skin: No rashes  Psych: irritable   Data Reviewed:  I have personally reviewed following labs and imaging studies  Micro Results Recent Results (from the past 240 hour(s))  MRSA PCR Screening     Status: None   Collection Time: 08/16/15 10:13 PM  Result Value Ref Range Status   MRSA by PCR NEGATIVE NEGATIVE Final    Comment:        The GeneXpert MRSA Assay (FDA approved for NASAL specimens only), is one component of a comprehensive MRSA colonization surveillance program. It is not intended to diagnose MRSA infection nor to guide or monitor treatment for MRSA infections.     Radiology Reports Dg Chest Portable 1 View  08/16/2015  CLINICAL DATA:  Patient from dialysis.  Five day crack cocaine binge EXAM: PORTABLE CHEST 1 VIEW COMPARISON:  3/2/ 17 FINDINGS: Mild cardiac enlargement. No pleural effusion or edema. No airspace consolidation. Degenerative changes are noted within both glenohumeral joints. IMPRESSION: 1. No acute cardiopulmonary abnormalities. Electronically Signed   By: Kerby Moors M.D.   On: 08/16/2015 19:04    CBC  Recent Labs Lab 08/16/15 1830 08/18/15 0328 08/19/15 1633 08/20/15 1247 08/21/15 1043  WBC 4.0 3.9* 3.4* 2.7* 4.9  HGB 12.2* 9.7* 9.8* 10.6* 9.4*  HCT 38.8* 31.1* 31.1* 33.9* 31.4*  PLT 162 137* 147* 160 140*  MCV 85.7 85.4 83.6 83.9 86.5  MCH 26.9 26.6 26.3 26.2 25.9*  MCHC 31.4 31.2 31.5 31.3 29.9*  RDW 15.6* 15.6* 15.4 15.4 15.7*  LYMPHSABS 0.9  --   --   --   --   MONOABS 0.5  --   --   --   --   EOSABS 0.2  --   --   --   --   BASOSABS  0.0  --   --   --   --     Chemistries   Recent Labs Lab 08/16/15 1830 08/18/15 0328 08/19/15 1633  08/20/15 1247 08/21/15 1043  NA 132* 134* 132* 133* 136  K 4.0 4.3 4.3 3.8 3.8  CL 96* 97* 98* 96* 98*  CO2 25 26 24 27 25   GLUCOSE 92 119* 86 127* 114*  BUN 11 23* 45* 26* 51*  CREATININE 6.02* 8.70* 10.19* 6.16* 7.81*  CALCIUM 8.2* 8.3* 8.4* 8.7* 8.5*  AST 17  --   --   --   --   ALT 8*  --   --   --   --   ALKPHOS 65  --   --   --   --   BILITOT 0.6  --   --   --   --    ------------------------------------------------------------------------------------------------------------------ estimated creatinine clearance is 7.9 mL/min (by C-G formula based on Cr of 7.81). ------------------------------------------------------------------------------------------------------------------ No results for input(s): HGBA1C in the last 72 hours. ------------------------------------------------------------------------------------------------------------------ No results for input(s): CHOL, HDL, LDLCALC, TRIG, CHOLHDL, LDLDIRECT in the last 72 hours. ------------------------------------------------------------------------------------------------------------------ No results for input(s): TSH, T4TOTAL, T3FREE, THYROIDAB in the last 72 hours.  Invalid input(s): FREET3 ------------------------------------------------------------------------------------------------------------------ No results for input(s): VITAMINB12, FOLATE, FERRITIN, TIBC, IRON, RETICCTPCT in the last 72 hours.  Coagulation profile  Recent Labs Lab 08/16/15 1830  INR 0.96    No results for input(s): DDIMER in the last 72 hours.  Cardiac Enzymes  Recent Labs Lab 08/16/15 1830 08/16/15 2256 08/17/15 0437  TROPONINI 0.13* 0.11* 0.10*   ------------------------------------------------------------------------------------------------------------------ Invalid input(s): POCBNP   Recent Labs  08/19/15 2221    GLUCAP 80     Bonnell Public M.D. [facility] 08/22/2015, 6:13 PM  Pager: [phone] Between 7am to 7pm - call Pager - [phone]  After 7pm go to [email or web address] - password [email or web address]  Call night coverage person covering after 7pm

## 2015-08-22 NOTE — Progress Notes (Signed)
08/22/2015 9:50 AM  Pt very upset about his diet restrictions.  Pt ate his full breakfast and demanded an extra helping of french toast, becoming verbally beligerent.  Spoke with Juanell Fairly, NP of his noncompliance with diet. Pt currently awaiting second breakfast tray.  I re-educated patient about his dietary restrictions, however, he declined to acknowledge this and is continuing to demand an extra tray.  NP aware.  Will montior. Princella Pellegrini

## 2015-08-22 NOTE — Evaluation (Signed)
Physical Therapy Evaluation Patient Details Name: [name] MRN: [id number] DOB: 08/12/1955 Today's Date: 08/22/2015   History of Present Illness  Patient is a 60 year old male with ESRD on HD, polysubstance abuse, hypertension, seizure disorder presented to the ED from his dialysis center with severe acute hypertension, chest pain, and generalized weakness. Patient reported he had been using crack cocaine daily for 5-7 days. He went to his dialysis center for his regularly scheduled session and completed it without incident. Upon completion he was noted to be hypertensive to the 230s/140s with chest pain.    Clinical Impression  Patient presents with problems listed below.  Will benefit from acute PT to maximize functional mobility prior to discharge home alone.    Follow Up Recommendations Home health PT;Supervision - Intermittent    Equipment Recommendations  Rolling walker with 5" wheels    Recommendations for Other Services       Precautions / Restrictions Precautions Precautions: Fall Restrictions Weight Bearing Restrictions: No      Mobility  Bed Mobility Overal bed mobility: Modified Independent             General bed mobility comments: Increased time to move to EOB.  Once in sitting, patient with good balance.  Patient reports feeling lightheaded.  Sat EOB x 7 min.  Transfers Overall transfer level: Needs assistance Equipment used: Rolling walker (2 wheeled) Transfers: Sit to/from Stand Sit to Stand: Min assist         General transfer comment: Verbal cues for hand placement.  Stood with min assist to power up to stance.  Patient stood x 45 seconds, became lightheaded, and returned to sitting.  Ambulation/Gait             General Gait Details: Declined  Stairs            Wheelchair Mobility    Modified Rankin (Stroke Patients Only)       Balance Overall balance assessment: Needs assistance Sitting-balance support: No upper  extremity supported;Feet supported Sitting balance-Leahy Scale: Good     Standing balance support: Bilateral upper extremity supported Standing balance-Leahy Scale: Poor                               Pertinent Vitals/Pain Pain Assessment: Faces Faces Pain Scale: Hurts a little bit Pain Location: Bil feet and fingers Pain Descriptors / Indicators: Numbness;Pins and needles Pain Intervention(s): Monitored during session    Home Living Family/patient expects to be discharged to:: Private residence Living Arrangements: Alone Available Help at Discharge: Friend(s);Available PRN/intermittently Type of Home: Apartment Home Access: Stairs to enter Entrance Stairs-Rails: Right Entrance Stairs-Number of Steps: 4 Home Layout: One level Home Equipment: Cane - single point      Prior Function Level of Independence: Independent with assistive device(s)         Comments: Patient uses cane for ambulation.  Friend helps with errands.  Uses transportation to HD.     Hand Dominance        Extremity/Trunk Assessment   Upper Extremity Assessment: Overall WFL for tasks assessed           Lower Extremity Assessment: Generalized weakness         Communication   Communication: No difficulties  Cognition Arousal/Alertness: Awake/alert Behavior During Therapy: Flat affect (More cooperative today) Overall Cognitive Status: Within Functional Limits for tasks assessed  General Comments      Exercises        Assessment/Plan    PT Assessment Patient needs continued PT services  PT Diagnosis Difficulty walking;Generalized weakness;Acute pain   PT Problem List Decreased strength;Decreased activity tolerance;Decreased balance;Decreased mobility;Decreased knowledge of use of DME;Pain  PT Treatment Interventions DME instruction;Gait training;Functional mobility training;Therapeutic activities;Stair training;Therapeutic  exercise;Patient/family education   PT Goals (Current goals can be found in the Care Plan section) Acute Rehab PT Goals Patient Stated Goal: To get some help at home PT Goal Formulation: With patient Time For Goal Achievement: 08/29/15 Potential to Achieve Goals: Good    Frequency Min 3X/week   Barriers to discharge Decreased caregiver support Patient lives alone    Co-evaluation               End of Session Equipment Utilized During Treatment: Gait belt Activity Tolerance: Patient limited by fatigue Patient left: in bed;with call bell/phone within reach;with bed alarm set           Time: 1049-1101 PT Time Calculation (min) (ACUTE ONLY): 12 min   Charges:   PT Evaluation $PT Eval Moderate Complexity: 1 Procedure     PT G CodesDespina Pole Aug 31, 2015, 12:36 PM Carita Pian. Sanjuana Kava, [place] Pager [phone]

## 2015-08-22 NOTE — Progress Notes (Signed)
[facility] Progress Note    Subjective:   Patient denies chest pain-is telling me to order his breakfast (has already eaten one breakfast). Patient has been rude/demanding to staff.   Objective Filed Vitals:   08/21/15 1830 08/21/15 1940 08/22/15 0615 08/22/15 0810  BP: 196/63 106/42 158/54 107/47  Pulse: 88 89 82 71  Temp: 99.8 F (37.7 C) 98.5 F (36.9 C) 100.2 F (37.9 C) 99.6 F (37.6 C)  TempSrc: Oral Oral  Oral  Resp: 18 19 18 17   Height:      Weight:      SpO2: 97% 97% 100% 97%   Physical Exam General: alert, NAD.  Heart: RRR No R/G/M Lungs: CTA Abdomen: soft , Nondistend / nontender active BS Extremities: No LE edema Dialysis Access: LUA AVF + bruit   Additional Objective Labs: Basic Metabolic Panel:  Recent Labs Lab 08/19/15 1633 08/20/15 1247 08/21/15 1043  NA 132* 133* 136  K 4.3 3.8 3.8  CL 98* 96* 98*  CO2 24 27 25   GLUCOSE 86 127* 114*  BUN 45* 26* 51*  CREATININE 10.19* 6.16* 7.81*  CALCIUM 8.4* 8.7* 8.5*  PHOS 4.6 2.9 1.8*   Liver Function Tests:  Recent Labs Lab 08/16/15 1830 08/19/15 1633 08/20/15 1247 08/21/15 1043  AST 17  --   --   --   ALT 8*  --   --   --   ALKPHOS 65  --   --   --   BILITOT 0.6  --   --   --   PROT 7.2  --   --   --   ALBUMIN 2.9* 2.4* 2.6* 2.6*   CBC:  Recent Labs Lab 08/16/15 1830 08/18/15 0328 08/19/15 1633 08/20/15 1247 08/21/15 1043  WBC 4.0 3.9* 3.4* 2.7* 4.9  NEUTROABS 2.4  --   --   --   --   HGB 12.2* 9.7* 9.8* 10.6* 9.4*  HCT 38.8* 31.1* 31.1* 33.9* 31.4*  MCV 85.7 85.4 83.6 83.9 86.5  PLT 162 137* 147* 160 140*   Blood Culture    Component Value Date/Time   SDES BLOOD RIGHT FOREARM 04/21/2014 0700   SPECREQUEST BOTTLES DRAWN AEROBIC AND ANAEROBIC 10CC EA 04/21/2014 0700   CULT  04/21/2014 0700    NO GROWTH 5 DAYS Performed at [facility] 04/27/2014 FINAL 04/21/2014 0700    Cardiac Enzymes:  Recent Labs Lab 08/16/15 1830  08/16/15 2256 08/17/15 0437  TROPONINI 0.13* 0.11* 0.10*   CBG:  Recent Labs Lab 08/19/15 2221  GLUCAP 80   Studies/Results: No results found. Medications:   . aspirin EC  81 mg Oral Daily  . atorvastatin  40 mg Oral q1800  . calcium acetate  2,001 mg Oral TID WC  . feeding supplement  1 Container Oral BID BM  . folic acid  1 mg Oral Daily  . heparin  5,000 Units Subcutaneous Q8H  . hydrALAZINE  50 mg Oral Q8H  . levETIRAcetam  1,000 mg Oral Daily  . losartan  100 mg Oral QPM  . multivitamin with minerals  1 tablet Oral Daily  . senna-docusate  1 tablet Oral BID  . thiamine  100 mg Oral Daily  . verapamil  120 mg Oral Q8H      Problem/Plan 1. Hypertensive emergency d/t cocaine abuse: bp now controlled with HD/ and bp meds.  2. Chest Pain: Troponin 0.13, 0.11, 0.10. No further CP/ Cardiology consulted= Demand ischemia d/t cocaine abuse. Echo  05/03 showed EF 55-60%. Mild concentric LVH.  3. ESRD - TTS @ [facility] HD For HD in OP center if Dc'd today.  4. Hypertension/volume - on home meds losartan, hydralazine; also verapamil appears to be new. HD yesterday Net UF 1000 post wt not done. Get wt today. Under edw /Will lower EDW on DC 5. Anemia - HGB 9.7. >10.6 Not on OP ESA, last in-center HGB 13.2 (08/07/15). aranesp 40 mcg IV yest wed  6. Metabolic bone disease - Ca /phos stable now in hosp Phos now 1.8 as he is actually taking medications. Hold binders today/ Cont VDRA 7. Nutrition - Albumin 2.9. Renal diet, renal vit/supplement.  8. Seizure disorder: Per primary. Cont keppra 9. Polysubstance abuse:  Ongoing issues. Patient has been counseled multiple times to stop. Patient himself has not sought OP treatment. Asked patient if he'd ever attended NA/AA meetings. "They didn't do nothin' for me".   Disposition: Per primary note either DC last evening or this AM.    Jimmye Norman. Brown NP-C 08/22/2015, 10:07 AM  [facility] [phone]  Pt seen, examined  and agree w A/P as above.  Kelly Splinter MD [employer] pager [phone]    cell [phone] 08/22/2015, 12:52 PM

## 2015-08-23 DIAGNOSIS — R5381 Other malaise: Secondary | ICD-10-CM

## 2015-08-23 LAB — CBC
HCT: 27.2 % — ABNORMAL LOW (ref 39.0–52.0)
HEMOGLOBIN: 8.4 g/dL — AB (ref 13.0–17.0)
MCH: 26.7 pg (ref 26.0–34.0)
MCHC: 30.9 g/dL (ref 30.0–36.0)
MCV: 86.3 fL (ref 78.0–100.0)
Platelets: 139 10*3/uL — ABNORMAL LOW (ref 150–400)
RBC: 3.15 MIL/uL — ABNORMAL LOW (ref 4.22–5.81)
RDW: 15.8 % — ABNORMAL HIGH (ref 11.5–15.5)
WBC: 4.7 10*3/uL (ref 4.0–10.5)

## 2015-08-23 LAB — RENAL FUNCTION PANEL
ALBUMIN: 2.4 g/dL — AB (ref 3.5–5.0)
ANION GAP: 12 (ref 5–15)
BUN: 42 mg/dL — ABNORMAL HIGH (ref 6–20)
CHLORIDE: 96 mmol/L — AB (ref 101–111)
CO2: 26 mmol/L (ref 22–32)
Calcium: 8.4 mg/dL — ABNORMAL LOW (ref 8.9–10.3)
Creatinine, Ser: 6.87 mg/dL — ABNORMAL HIGH (ref 0.61–1.24)
GFR calc Af Amer: 9 mL/min — ABNORMAL LOW (ref >60)
GFR calc non Af Amer: 8 mL/min — ABNORMAL LOW (ref >60)
GLUCOSE: 128 mg/dL — AB (ref 65–99)
PHOSPHORUS: 2.2 mg/dL — AB (ref 2.5–4.6)
POTASSIUM: 4.3 mmol/L (ref 3.5–5.1)
Sodium: 134 mmol/L — ABNORMAL LOW (ref 135–145)

## 2015-08-23 MED ORDER — SENNOSIDES-DOCUSATE SODIUM 8.6-50 MG PO TABS
2.0000 | ORAL_TABLET | Freq: Every evening | ORAL | Status: DC | PRN
  Administered 2015-08-23: 2 via ORAL
  Filled 2015-08-23: qty 2

## 2015-08-23 MED ORDER — POLYETHYLENE GLYCOL 3350 17 G PO PACK
17.0000 g | PACK | Freq: Every day | ORAL | Status: DC
  Administered 2015-08-23 – 2015-08-26 (×4): 17 g via ORAL
  Filled 2015-08-23 (×4): qty 1

## 2015-08-23 NOTE — Progress Notes (Signed)
[facility]                                                                              Patient Demographics  [name], is a 60 y.o. male, DOB - 11-May-1955, ZO:[id number]  Admit date - 08/16/2015   Admitting Physician Vianne Bulls, MD  Outpatient Primary MD for the patient is No primary care provider on file.  Outpatient specialists:   LOS - 6  days    Chief Complaint  Patient presents with  . Code STEMI       Brief summary   Patient is a 60 year old male with ESRD on HD, polysubstance abuse, hypertension, seizure disorder presented to the ED from his dialysis center with severe acute hypertension, chest pain, and generalized weakness. Patient reported he had been using crack cocaine daily for 5-7 days. He went to his dialysis center for his regularly scheduled session and completed it without incident. Upon completion he was noted to be hypertensive to the 230s/140s with chest pain. He was directed to the emergency department. Upon arrival to the ED blood pressure was 236/149. EKG revealed sinus rhythm with LVH by voltage criteria and T-wave inversions in the inferolateral leads. Initial troponin was 0.13. Nitroglycerin drip was initiated. Cardiology was consulted. Urine drug change showed cocaine positive. Patient reports constipation, and not feeling himself. Patient is asking for short term rehabilitation. Patient was transferred to the floor on 08/19/15   Assessment & Plan    Chest pain With elevated troponins likely due to Demand ischemia from hypertensive emergency - Continue to avoid beta blockers due to cocaine use  - 2-D echo showed EF of 55-60% with grade 1 diastolic dysfunction. Patient was strongly counseled to quit cocaine -Hypertensive emergency : Optimize.   -ESRD on T/Th/Sat HD - Seen on HD today. Possible short term rehab. Will consult PT/OT/Case management.  -Cocaine / Polysubstance abuse -  Patient was strongly counseled to quit cocaine.  Patient disinterested in discontinuing EtOH or Cocaine. Continue CIWA protocol -Constipation - Placed on Senokot, MiraLAX and tap water enema if no BM with stool softener/laxative  -Seizure disorder - Continue Keppra 1000 mg daily - Anemia of CKD   - Deconditioning - PT/OT/Case Management consult - Assess for possible short term rehabilitation.  Code Status: DO NOT RESUSCITATE  Disposition Plan: Patient was not keen on being discharged back home.   Procedures  Hemodialysis  Consults   Nephrology  DVT Prophylaxis   SCD's  Medications  Scheduled Meds: . aspirin EC  81 mg Oral Daily  . atorvastatin  40 mg Oral q1800  . feeding supplement  1 Container Oral BID BM  . folic acid  1 mg Oral Daily  . heparin  5,000 Units Subcutaneous Q8H  . hydrALAZINE  50 mg Oral Q8H  . levETIRAcetam  1,000 mg Oral Daily  . losartan  100 mg Oral QPM  . multivitamin with minerals  1 tablet Oral Daily  . polyethylene glycol  17 g Oral Daily  . senna-docusate  1 tablet Oral BID  . thiamine  100 mg Oral Daily  . verapamil  120 mg Oral Q8H  Continuous Infusions:   PRN Meds:.sodium chloride, sodium chloride, acetaminophen, alteplase, heparin, lidocaine (PF), lidocaine-prilocaine, LORazepam, ondansetron (ZOFRAN) IV, pentafluoroprop-tetrafluoroeth, senna-docusate, zolpidem   Antibiotics   Anti-infectives    None        Subjective:  Seen on dialysis. Reports not being himself. Asking to be discharged to a short term rehab facility. Still reports constipation.  Objective:   Filed Vitals:   08/23/15 1100 08/23/15 1130 08/23/15 1200 08/23/15 1230  BP: 169/68 155/62 178/67 171/68  Pulse: 79 97 97 97  Temp:      TempSrc:      Resp: 16 17 15 18   Height:      Weight:      SpO2:        Intake/Output Summary (Last 24 hours) at 08/23/15 1258 Last data filed at 08/23/15 0600  Gross per 24 hour  Intake    942 ml  Output      0 ml  Net    942 ml     Wt Readings from Last 3  Encounters:  08/23/15 55.4 kg (122 lb 2.2 oz)  06/20/15 60.3 kg (132 lb 15 oz)  04/12/15 53 kg (116 lb 13.5 oz)     Exam  General: Alert and oriented x 3, NAD, irritable   HEENT:    Neck:   CVS: S1 S2 clear, RRR  Respiratory: CTAB  Abdomen: Soft, nontender, nondistended, + bowel sounds  Ext: no cyanosis clubbing or edema  Neuro:   Skin: No rashes  Psych: irritable   Data Reviewed:  I have personally reviewed following labs and imaging studies  Micro Results Recent Results (from the past 240 hour(s))  MRSA PCR Screening     Status: None   Collection Time: 08/16/15 10:13 PM  Result Value Ref Range Status   MRSA by PCR NEGATIVE NEGATIVE Final    Comment:        The GeneXpert MRSA Assay (FDA approved for NASAL specimens only), is one component of a comprehensive MRSA colonization surveillance program. It is not intended to diagnose MRSA infection nor to guide or monitor treatment for MRSA infections.     Radiology Reports Dg Chest Portable 1 View  08/16/2015  CLINICAL DATA:  Patient from dialysis.  Five day crack cocaine binge EXAM: PORTABLE CHEST 1 VIEW COMPARISON:  3/2/ 17 FINDINGS: Mild cardiac enlargement. No pleural effusion or edema. No airspace consolidation. Degenerative changes are noted within both glenohumeral joints. IMPRESSION: 1. No acute cardiopulmonary abnormalities. Electronically Signed   By: Kerby Moors M.D.   On: 08/16/2015 19:04   Dg Abd Portable 1v  08/22/2015  CLINICAL DATA:  Abdominal pain and constipation EXAM: PORTABLE ABDOMEN - 1 VIEW COMPARISON:  CT abdomen and pelvis Aug 25, 2013 FINDINGS: There is fairly diffuse stool throughout the colon. Stomach is mildly distended with air. There is no small or large bowel dilatation. No air-fluid level. No free air evident. Lung bases clear. Surgical clips are noted in the right upper quadrant. There is arterial vascular calcification in the pelvis. IMPRESSION: Fairly diffuse stool throughout  colon. No bowel obstruction or free air. Stomach is mildly distended with air. Lung bases clear. Electronically Signed   By: Lowella Grip III M.D.   On: 08/22/2015 18:53    CBC  Recent Labs Lab 08/16/15 1830 08/18/15 0328 08/19/15 1633 08/20/15 1247 08/21/15 1043 08/23/15 0927  WBC 4.0 3.9* 3.4* 2.7* 4.9 4.7  HGB 12.2* 9.7* 9.8* 10.6* 9.4* 8.4*  HCT 38.8* 31.1* 31.1* 33.9* 31.4*  27.2*  PLT 162 137* 147* 160 140* 139*  MCV 85.7 85.4 83.6 83.9 86.5 86.3  MCH 26.9 26.6 26.3 26.2 25.9* 26.7  MCHC 31.4 31.2 31.5 31.3 29.9* 30.9  RDW 15.6* 15.6* 15.4 15.4 15.7* 15.8*  LYMPHSABS 0.9  --   --   --   --   --   MONOABS 0.5  --   --   --   --   --   EOSABS 0.2  --   --   --   --   --   BASOSABS 0.0  --   --   --   --   --     Chemistries   Recent Labs Lab 08/16/15 1830 08/18/15 0328 08/19/15 1633 08/20/15 1247 08/21/15 1043 08/23/15 0927  NA 132* 134* 132* 133* 136 134*  K 4.0 4.3 4.3 3.8 3.8 4.3  CL 96* 97* 98* 96* 98* 96*  CO2 25 26 24 27 25 26   GLUCOSE 92 119* 86 127* 114* 128*  BUN 11 23* 45* 26* 51* 42*  CREATININE 6.02* 8.70* 10.19* 6.16* 7.81* 6.87*  CALCIUM 8.2* 8.3* 8.4* 8.7* 8.5* 8.4*  AST 17  --   --   --   --   --   ALT 8*  --   --   --   --   --   ALKPHOS 65  --   --   --   --   --   BILITOT 0.6  --   --   --   --   --    ------------------------------------------------------------------------------------------------------------------ estimated creatinine clearance is 9.2 mL/min (by C-G formula based on Cr of 6.87). ------------------------------------------------------------------------------------------------------------------ No results for input(s): HGBA1C in the last 72 hours. ------------------------------------------------------------------------------------------------------------------ No results for input(s): CHOL, HDL, LDLCALC, TRIG, CHOLHDL, LDLDIRECT in the last 72  hours. ------------------------------------------------------------------------------------------------------------------ No results for input(s): TSH, T4TOTAL, T3FREE, THYROIDAB in the last 72 hours.  Invalid input(s): FREET3 ------------------------------------------------------------------------------------------------------------------ No results for input(s): VITAMINB12, FOLATE, FERRITIN, TIBC, IRON, RETICCTPCT in the last 72 hours.  Coagulation profile  Recent Labs Lab 08/16/15 1830  INR 0.96    No results for input(s): DDIMER in the last 72 hours.  Cardiac Enzymes  Recent Labs Lab 08/16/15 1830 08/16/15 2256 08/17/15 0437  TROPONINI 0.13* 0.11* 0.10*   ------------------------------------------------------------------------------------------------------------------ Invalid input(s): POCBNP  No results for input(s): GLUCAP in the last 72 hours.   Bonnell Public M.D. [facility] 08/23/2015, 12:58 PM  Pager: [phone] Between 7am to 7pm - call Pager - [phone]  After 7pm go to [email or web address] - password [email or web address]  Call night coverage person covering after 7pm

## 2015-08-23 NOTE — Progress Notes (Signed)
Late entry:  Patient had been complaining of constipation earlier in shift.  Notified NP on-call.  New orders received.  Medication administered.  Will continue to monitor.

## 2015-08-23 NOTE — Progress Notes (Signed)
[facility] Progress Note   Subjective:    Seen on HD patient is refusing to stand up to be weighed. "Honey, I'm just too weak-I tried to walk yesterday but I had to sit down. Patient says he is willing to go to SNF if needed because "I think I need some help". No chest pain/SOB  Objective Filed Vitals:   08/23/15 0930 08/23/15 1000 08/23/15 1030 08/23/15 1100  BP: 159/67 147/87 167/70 169/68  Pulse: 96 97 97 79  Temp:      TempSrc:      Resp: 17 21 17 16   Height:      Weight:      SpO2:       Physical Exam General: alert, NAD.  Heart: RRR No R/G/M Lungs: CTA Abdomen: soft , Nondistend / nontender active BS Extremities: No LE edema Dialysis Access: LUA AVF + bruit  HD: [facility] BFR 400, DFR Autoflow 1.5 EDW 55 (kg),  3.0 K, 2.25 Ca,  UFR Profile: Profile 2 LUA AVF Hectoral 7 mcg IV q treatment   Additional Objective Labs: Basic Metabolic Panel:  Recent Labs Lab 08/20/15 1247 08/21/15 1043 08/23/15 0927  NA 133* 136 134*  K 3.8 3.8 4.3  CL 96* 98* 96*  CO2 27 25 26   GLUCOSE 127* 114* 128*  BUN 26* 51* 42*  CREATININE 6.16* 7.81* 6.87*  CALCIUM 8.7* 8.5* 8.4*  PHOS 2.9 1.8* 2.2*   Liver Function Tests:  Recent Labs Lab 08/16/15 1830  08/20/15 1247 08/21/15 1043 08/23/15 0927  AST 17  --   --   --   --   ALT 8*  --   --   --   --   ALKPHOS 65  --   --   --   --   BILITOT 0.6  --   --   --   --   PROT 7.2  --   --   --   --   ALBUMIN 2.9*  < > 2.6* 2.6* 2.4*  < > = values in this interval not displayed. No results for input(s): LIPASE, AMYLASE in the last 168 hours. CBC:  Recent Labs Lab 08/16/15 1830 08/18/15 0328 08/19/15 1633 08/20/15 1247 08/21/15 1043 08/23/15 0927  WBC 4.0 3.9* 3.4* 2.7* 4.9 4.7  NEUTROABS 2.4  --   --   --   --   --   HGB 12.2* 9.7* 9.8* 10.6* 9.4* 8.4*  HCT 38.8* 31.1* 31.1* 33.9* 31.4* 27.2*  MCV 85.7 85.4 83.6 83.9 86.5 86.3  PLT 162 137* 147* 160 140* 139*   Blood Culture    Component  Value Date/Time   SDES BLOOD RIGHT FOREARM 04/21/2014 0700   SPECREQUEST BOTTLES DRAWN AEROBIC AND ANAEROBIC 10CC EA 04/21/2014 0700   CULT  04/21/2014 0700    NO GROWTH 5 DAYS Performed at [place] 04/27/2014 FINAL 04/21/2014 0700    Cardiac Enzymes:  Recent Labs Lab 08/16/15 1830 08/16/15 2256 08/17/15 0437  TROPONINI 0.13* 0.11* 0.10*   CBG:  Recent Labs Lab 08/19/15 2221  GLUCAP 80   Iron Studies: No results for input(s): IRON, TIBC, TRANSFERRIN, FERRITIN in the last 72 hours. @lablastinr3 @ Studies/Results: Dg Abd Portable 1v  08/22/2015  CLINICAL DATA:  Abdominal pain and constipation EXAM: PORTABLE ABDOMEN - 1 VIEW COMPARISON:  CT abdomen and pelvis Aug 25, 2013 FINDINGS: There is fairly diffuse stool throughout the colon. Stomach is mildly distended with air. There is no small  or large bowel dilatation. No air-fluid level. No free air evident. Lung bases clear. Surgical clips are noted in the right upper quadrant. There is arterial vascular calcification in the pelvis. IMPRESSION: Fairly diffuse stool throughout colon. No bowel obstruction or free air. Stomach is mildly distended with air. Lung bases clear. Electronically Signed   By: Lowella Grip III M.D.   On: 08/22/2015 18:53   Medications:   . aspirin EC  81 mg Oral Daily  . atorvastatin  40 mg Oral q1800  . feeding supplement  1 Container Oral BID BM  . folic acid  1 mg Oral Daily  . heparin  5,000 Units Subcutaneous Q8H  . hydrALAZINE  50 mg Oral Q8H  . levETIRAcetam  1,000 mg Oral Daily  . losartan  100 mg Oral QPM  . multivitamin with minerals  1 tablet Oral Daily  . senna-docusate  1 tablet Oral BID  . thiamine  100 mg Oral Daily  . verapamil  120 mg Oral Q8H    Problem/Plan 1. Hypertensive emergency d/t cocaine abuse: bp now controlled with HD/ and bp meds. Cardiology signed off. No further chest pain.  2. Chest Pain: Troponin 0.13, 0.11, 0.10. No further CP/ Cardiology  consulted= Demand ischemia d/t cocaine abuse. Echo 05/03 showed EF 55-60%. Mild concentric LVH.  3. ESRD - TTS @ [facility]. On HD today per schedule. K+4.3 4. Hypertension/volume - on home meds losartan, hydralazine; also verapamil appears to be new. UFG today 1.5 liters. Did not stand for wt. BP stable, tolerating HD well. Will lower EDW on DC 5. Anemia - HGB 9.7. >10.6 Not on OP ESA, last in-center HGB 13.2 (08/07/15). aranesp 40 mcg IV yest wed  6. Metabolic bone disease - Ca 8.4 C Ca 9.68 Phos 2.2. Hold binders/ Cont VDRA 7. Nutrition - Albumin 2.9. Renal diet, renal vit/supplement.  8. Seizure disorder: Per primary. Cont keppra 9. Polysubstance abuse: Ongoing issues. Patient has been counseled multiple times to stop. Patient himself has not sought OP treatment. Asked patient if he'd ever attended NA/AA meetings. "They didn't do nothin' for me".  Disposition: Per primary note either DC last evening or this AM however patient was not discharged. Now with new C/O weakness, being unable to stand. May need SNF placement. Ask case management to see pt.    Rita H. Brown NP-C 08/22/2015, 10:07 AM  [facility] [phone]  Pt seen, examined and agree w A/P as above. HD today.  Multiple complaints, abd pain, dizziness on standing. Check orthostatics after HD.   Kelly Splinter MD [employer] pager [phone]    cell [phone] 08/23/2015, 12:23 PM

## 2015-08-24 DIAGNOSIS — E43 Unspecified severe protein-calorie malnutrition: Secondary | ICD-10-CM

## 2015-08-24 NOTE — Progress Notes (Signed)
[facility]                                                                              Patient Demographics  [name], is a 60 y.o. male, DOB - 1955-11-29, [id number]  Admit date - 08/16/2015   Admitting Physician Vianne Bulls, MD  Outpatient Primary MD for the patient is No primary care provider on file.  Outpatient specialists:   LOS - 7  days    Chief Complaint  Patient presents with  . Code STEMI       Brief summary   Patient is a 60 year old male with ESRD on HD, polysubstance abuse, hypertension, seizure disorder presented to the ED from his dialysis center with severe acute hypertension, chest pain, and generalized weakness. Patient reported he had been using crack cocaine daily for 5-7 days. He went to his dialysis center for his regularly scheduled session and completed it without incident. Upon completion he was noted to be hypertensive to the 230s/140s with chest pain. He was directed to the emergency department. Upon arrival to the ED blood pressure was 236/149. EKG revealed sinus rhythm with LVH by voltage criteria and T-wave inversions in the inferolateral leads. Initial troponin was 0.13. Nitroglycerin drip was initiated. Cardiology was consulted. Urine drug change showed cocaine positive. Patient reports constipation, and not feeling himself. Patient is asking for short term rehabilitation. Patient was transferred to the floor on 08/19/15   Assessment & Plan    Chest pain With elevated troponins likely due to Demand ischemia from hypertensive emergency - Continue to avoid beta blockers due to cocaine use  - 2-D echo showed EF of 55-60% with grade 1 diastolic dysfunction. Patient was strongly counseled to quit cocaine. - Chest pain has resolved. -Hypertensive emergency : Optimize.   -ESRD on T/Th/Sat HD - For HD on Tuesday if patient is still in the hospital. -Cocaine / Polysubstance abuse -  Patient was strongly counseled to quit  cocaine. Patient disinterested in discontinuing EtOH or Cocaine. Continue CIWA protocol -Constipation - Laxatives. Enemas PRN.  -Seizure disorder - Continue Keppra 1000 mg daily - Anemia of CKD   - Deconditioning - PT/OT/Case Management consult - Assess for possible short term rehabilitation.  Code Status: DO NOT RESUSCITATE  Disposition Plan: Possible short term rehab. PT/OT/Case management consulted.     Procedures  Hemodialysis  Consults   Nephrology  DVT Prophylaxis   SCD's  Medications  Scheduled Meds: . aspirin EC  81 mg Oral Daily  . atorvastatin  40 mg Oral q1800  . feeding supplement  1 Container Oral BID BM  . folic acid  1 mg Oral Daily  . heparin  5,000 Units Subcutaneous Q8H  . hydrALAZINE  50 mg Oral Q8H  . levETIRAcetam  1,000 mg Oral Daily  . losartan  100 mg Oral QPM  . multivitamin with minerals  1 tablet Oral Daily  . polyethylene glycol  17 g Oral Daily  . senna-docusate  1 tablet Oral BID  . thiamine  100 mg Oral Daily  . verapamil  120 mg Oral Q8H   Continuous Infusions:   PRN Meds:.sodium chloride, sodium  chloride, acetaminophen, alteplase, heparin, lidocaine (PF), lidocaine-prilocaine, LORazepam, ondansetron (ZOFRAN) IV, pentafluoroprop-tetrafluoroeth, senna-docusate, zolpidem   Antibiotics   Anti-infectives    None        Subjective:  Seen on dialysis. Reports not being himself. Asking to be discharged to a short term rehab facility. Still reports constipation.  Objective:   Filed Vitals:   08/23/15 1712 08/23/15 2150 08/24/15 0500 08/24/15 0754  BP: 139/74 159/57 151/49 106/47  Pulse: 74 85 77 64  Temp: 98.3 F (36.8 C) 98.4 F (36.9 C) 97.6 F (36.4 C) 98.7 F (37.1 C)  TempSrc: Oral Oral Oral Oral  Resp:  20 19   Height:      Weight:      SpO2: 96% 98% 100% 100%    Intake/Output Summary (Last 24 hours) at 08/24/15 1619 Last data filed at 08/24/15 0841  Gross per 24 hour  Intake    660 ml  Output      0 ml  Net     660 ml     Wt Readings from Last 3 Encounters:  08/23/15 55.4 kg (122 lb 2.2 oz)  06/20/15 60.3 kg (132 lb 15 oz)  04/12/15 53 kg (116 lb 13.5 oz)     Exam  General: Alert and oriented x 3, NAD, irritable   HEENT:    Neck:   CVS: S1 S2 clear, RRR  Respiratory: CTAB  Abdomen: Soft, nontender, nondistended, + bowel sounds  Ext: no cyanosis clubbing or edema  Neuro:   Skin: No rashes  Psych: irritable   Data Reviewed:  I have personally reviewed following labs and imaging studies  Micro Results Recent Results (from the past 240 hour(s))  MRSA PCR Screening     Status: None   Collection Time: 08/16/15 10:13 PM  Result Value Ref Range Status   MRSA by PCR NEGATIVE NEGATIVE Final    Comment:        The GeneXpert MRSA Assay (FDA approved for NASAL specimens only), is one component of a comprehensive MRSA colonization surveillance program. It is not intended to diagnose MRSA infection nor to guide or monitor treatment for MRSA infections.     Radiology Reports Dg Chest Portable 1 View  08/16/2015  CLINICAL DATA:  Patient from dialysis.  Five day crack cocaine binge EXAM: PORTABLE CHEST 1 VIEW COMPARISON:  3/2/ 17 FINDINGS: Mild cardiac enlargement. No pleural effusion or edema. No airspace consolidation. Degenerative changes are noted within both glenohumeral joints. IMPRESSION: 1. No acute cardiopulmonary abnormalities. Electronically Signed   By: Kerby Moors M.D.   On: 08/16/2015 19:04   Dg Abd Portable 1v  08/22/2015  CLINICAL DATA:  Abdominal pain and constipation EXAM: PORTABLE ABDOMEN - 1 VIEW COMPARISON:  CT abdomen and pelvis Aug 25, 2013 FINDINGS: There is fairly diffuse stool throughout the colon. Stomach is mildly distended with air. There is no small or large bowel dilatation. No air-fluid level. No free air evident. Lung bases clear. Surgical clips are noted in the right upper quadrant. There is arterial vascular calcification in the pelvis.  IMPRESSION: Fairly diffuse stool throughout colon. No bowel obstruction or free air. Stomach is mildly distended with air. Lung bases clear. Electronically Signed   By: Lowella Grip III M.D.   On: 08/22/2015 18:53    CBC  Recent Labs Lab 08/18/15 0328 08/19/15 1633 08/20/15 1247 08/21/15 1043 08/23/15 0927  WBC 3.9* 3.4* 2.7* 4.9 4.7  HGB 9.7* 9.8* 10.6* 9.4* 8.4*  HCT 31.1* 31.1* 33.9* 31.4* 27.2*  PLT 137* 147* 160 140* 139*  MCV 85.4 83.6 83.9 86.5 86.3  MCH 26.6 26.3 26.2 25.9* 26.7  MCHC 31.2 31.5 31.3 29.9* 30.9  RDW 15.6* 15.4 15.4 15.7* 15.8*    Chemistries   Recent Labs Lab 08/18/15 0328 08/19/15 1633 08/20/15 1247 08/21/15 1043 08/23/15 0927  NA 134* 132* 133* 136 134*  K 4.3 4.3 3.8 3.8 4.3  CL 97* 98* 96* 98* 96*  CO2 26 24 27 25 26   GLUCOSE 119* 86 127* 114* 128*  BUN 23* 45* 26* 51* 42*  CREATININE 8.70* 10.19* 6.16* 7.81* 6.87*  CALCIUM 8.3* 8.4* 8.7* 8.5* 8.4*   ------------------------------------------------------------------------------------------------------------------ estimated creatinine clearance is 9.2 mL/min (by C-G formula based on Cr of 6.87). ------------------------------------------------------------------------------------------------------------------ No results for input(s): HGBA1C in the last 72 hours. ------------------------------------------------------------------------------------------------------------------ No results for input(s): CHOL, HDL, LDLCALC, TRIG, CHOLHDL, LDLDIRECT in the last 72 hours. ------------------------------------------------------------------------------------------------------------------ No results for input(s): TSH, T4TOTAL, T3FREE, THYROIDAB in the last 72 hours.  Invalid input(s): FREET3 ------------------------------------------------------------------------------------------------------------------ No results for input(s): VITAMINB12, FOLATE, FERRITIN, TIBC, IRON, RETICCTPCT in the last 72  hours.  Coagulation profile No results for input(s): INR, PROTIME in the last 168 hours.  No results for input(s): DDIMER in the last 72 hours.  Cardiac Enzymes No results for input(s): CKMB, TROPONINI, MYOGLOBIN in the last 168 hours.  Invalid input(s): CK ------------------------------------------------------------------------------------------------------------------ Invalid input(s): POCBNP  No results for input(s): GLUCAP in the last 72 hours.   Bonnell Public M.D. [facility] 08/24/2015, 4:19 PM  Pager: [phone] Between 7am to 7pm - call Pager - [phone]  After 7pm go to [email or web address] - password [email or web address]  Call night coverage person covering after 7pm

## 2015-08-24 NOTE — Progress Notes (Signed)
[facility] Progress Note   Subjective:  Multiple daily complaints  Objective Filed Vitals:   08/23/15 1712 08/23/15 2150 08/24/15 0500 08/24/15 0754  BP: 139/74 159/57 151/49 106/47  Pulse: 74 85 77 64  Temp: 98.3 F (36.8 C) 98.4 F (36.9 C) 97.6 F (36.4 C) 98.7 F (37.1 C)  TempSrc: Oral Oral Oral Oral  Resp:  20 19   Height:      Weight:      SpO2: 96% 98% 100% 100%   Physical Exam General: alert, NAD.  Heart: RRR No R/G/M Lungs: CTA Abdomen: soft , Nondistend / nontender active BS Extremities: No LE edema Dialysis Access: LUA AVF + bruit  HD: [place] [facility] BFR 400, DFR Autoflow 1.5 EDW 55 (kg),  3.0 K, 2.25 Ca,  UFR Profile: Profile 2 LUA AVF Hectoral 7 mcg IV q treatment   Additional Objective Labs: Basic Metabolic Panel:  Recent Labs Lab 08/20/15 1247 08/21/15 1043 08/23/15 0927  NA 133* 136 134*  K 3.8 3.8 4.3  CL 96* 98* 96*  CO2 27 25 26   GLUCOSE 127* 114* 128*  BUN 26* 51* 42*  CREATININE 6.16* 7.81* 6.87*  CALCIUM 8.7* 8.5* 8.4*  PHOS 2.9 1.8* 2.2*   Liver Function Tests:  Recent Labs Lab 08/20/15 1247 08/21/15 1043 08/23/15 0927  ALBUMIN 2.6* 2.6* 2.4*   No results for input(s): LIPASE, AMYLASE in the last 168 hours. CBC:  Recent Labs Lab 08/18/15 0328 08/19/15 1633 08/20/15 1247 08/21/15 1043 08/23/15 0927  WBC 3.9* 3.4* 2.7* 4.9 4.7  HGB 9.7* 9.8* 10.6* 9.4* 8.4*  HCT 31.1* 31.1* 33.9* 31.4* 27.2*  MCV 85.4 83.6 83.9 86.5 86.3  PLT 137* 147* 160 140* 139*   Blood Culture    Component Value Date/Time   SDES BLOOD RIGHT FOREARM 04/21/2014 0700   SPECREQUEST BOTTLES DRAWN AEROBIC AND ANAEROBIC 10CC EA 04/21/2014 0700   CULT  04/21/2014 0700    NO GROWTH 5 DAYS Performed at [facility] 04/27/2014 FINAL 04/21/2014 0700    Cardiac Enzymes: No results for input(s): CKTOTAL, CKMB, CKMBINDEX, TROPONINI in the last 168 hours. CBG:  Recent Labs Lab 08/19/15 2221  GLUCAP 80    Iron Studies: No results for input(s): IRON, TIBC, TRANSFERRIN, FERRITIN in the last 72 hours. @lablastinr3 @ Studies/Results: Dg Abd Portable 1v  08/22/2015  CLINICAL DATA:  Abdominal pain and constipation EXAM: PORTABLE ABDOMEN - 1 VIEW COMPARISON:  CT abdomen and pelvis Aug 25, 2013 FINDINGS: There is fairly diffuse stool throughout the colon. Stomach is mildly distended with air. There is no small or large bowel dilatation. No air-fluid level. No free air evident. Lung bases clear. Surgical clips are noted in the right upper quadrant. There is arterial vascular calcification in the pelvis. IMPRESSION: Fairly diffuse stool throughout colon. No bowel obstruction or free air. Stomach is mildly distended with air. Lung bases clear. Electronically Signed   By: Lowella Grip III M.D.   On: 08/22/2015 18:53   Medications:   . aspirin EC  81 mg Oral Daily  . atorvastatin  40 mg Oral q1800  . feeding supplement  1 Container Oral BID BM  . folic acid  1 mg Oral Daily  . heparin  5,000 Units Subcutaneous Q8H  . hydrALAZINE  50 mg Oral Q8H  . levETIRAcetam  1,000 mg Oral Daily  . losartan  100 mg Oral QPM  . multivitamin with minerals  1 tablet Oral Daily  . polyethylene glycol  17 g Oral Daily  . senna-docusate  1 tablet Oral BID  . thiamine  100 mg Oral Daily  . verapamil  120 mg Oral Q8H    Assessment: 1. Hypertensive emergency d/t cocaine abuse: bp now controlled with HD/ and bp meds. Cardiology signed off. No further chest pain.  2. Chest Pain: Troponin 0.13, 0.11, 0.10. No further CP/ Cardiology consulted= Demand ischemia d/t cocaine abuse. Echo 05/03 showed EF 55-60%. Mild concentric LVH.  3. ESRD - TTS @ [facility]. On HD today per schedule. K+4.3 4. Hypertension/volume - on home meds losartan, hydralazine; also verapamil appears to be new. UFG today 1.5 liters. Did not stand for wt. BP stable, tolerating HD well. Will lower EDW on DC 5. Anemia - HGB 9.7. >10.6 Not on OP ESA, last  in-center HGB 13.2 (08/07/15). aranesp 40 mcg IV yest wed  6. Metabolic bone disease - Ca 8.4 C Ca 9.68 Phos 2.2. Hold binders/ Cont VDRA 7. Nutrition - Albumin 2.9. Renal diet, renal vit/supplement.  8. Seizure disorder: Per primary. Cont keppra 9. Polysubstance abuse: Ongoing issues. Patient has been counseled multiple times to stop. Patient himself has not sought OP treatment. Asked patient if he'd ever attended NA/AA meetings. "They didn't do nothin' for me" 10. Disposition - considering SNF placement  Plan - HD again Tuesday.     Kelly Splinter MD [employer] pager [phone]    cell [phone] 08/24/2015, 4:21 PM

## 2015-08-25 DIAGNOSIS — D631 Anemia in chronic kidney disease: Secondary | ICD-10-CM

## 2015-08-25 DIAGNOSIS — N185 Chronic kidney disease, stage 5: Secondary | ICD-10-CM

## 2015-08-25 LAB — IRON AND TIBC
IRON: 90 ug/dL (ref 45–182)
Saturation Ratios: 44 % — ABNORMAL HIGH (ref 17.9–39.5)
TIBC: 206 ug/dL — AB (ref 250–450)
UIBC: 116 ug/dL

## 2015-08-25 LAB — DRUG SCREEN 10 W/CONF, SERUM
AMPHETAMINES, IA: NEGATIVE ng/mL
Barbiturates, IA: NEGATIVE ug/mL
Benzodiazepines, IA: NEGATIVE ng/mL
Cocaine & Metabolite, IA: POSITIVE ng/mL
METHADONE, IA: NEGATIVE ng/mL
OPIATES, IA: NEGATIVE ng/mL
OXYCODONES, IA: NEGATIVE ng/mL
Phencyclidine, IA: NEGATIVE ng/mL
Propoxyphene, IA: NEGATIVE ng/mL
THC(MARIJUANA) METABOLITE, IA: NEGATIVE ng/mL

## 2015-08-25 LAB — COCAINE,MS,WB/SP RFX
Benzoylecgonine: >1500 ng/mL
COCAINE CONFIRMATION: POSITIVE
Cocaine: NEGATIVE ng/mL

## 2015-08-25 MED ORDER — DARBEPOETIN ALFA 100 MCG/0.5ML IJ SOSY
100.0000 ug | PREFILLED_SYRINGE | INTRAMUSCULAR | Status: DC
  Administered 2015-08-26: 100 ug via INTRAVENOUS

## 2015-08-25 NOTE — Progress Notes (Signed)
Physical Therapy Treatment Patient Details Name: [name] MRN: [id number] DOB: 1956-01-28 Today's Date: 08/25/2015    History of Present Illness Patient is a 60 year old male with ESRD on HD, polysubstance abuse, hypertension, seizure disorder presented to the ED from his dialysis center with severe acute hypertension, chest pain, and generalized weakness. Patient reported he had been using crack cocaine daily for 5-7 days. He went to his dialysis center for his regularly scheduled session and completed it without incident. Upon completion he was noted to be hypertensive to the 230s/140s with chest pain.    PT Comments    Pt progressing slowly with PT regarding mobility. Pt able to tolerate stand pivot transfers today but declining to ambulate due to reports of feeling too weak. The pt did have knee buckling with initial standing but was able to perform transfers with +1 assistance. Based upon the patient's currently mobility level, recommending SNF for further rehabilitation at D/C. PT to continue to follow and advance mobility as tolerated.   Follow Up Recommendations  Home health PT;SNF     Equipment Recommendations  Rolling walker with 5" wheels    Recommendations for Other Services       Precautions / Restrictions Precautions Precautions: Fall Restrictions Weight Bearing Restrictions: No    Mobility  Bed Mobility Overal bed mobility: Needs Assistance Bed Mobility: Supine to Sit     Supine to sit: Min assist     General bed mobility comments: min assist at trunk to come to sitting, pt using rail to assist.   Transfers Overall transfer level: Needs assistance Equipment used: 1 person hand held assist Transfers: Sit to/from Stand;Stand Pivot Transfers Sit to Stand: Min assist Stand pivot transfers: Min assist       General transfer comment: Pt transferring from bed to [facility] to chair. Pt refusing ambulation at this time. Knees buckling with initial stand but stable  with pivot. Pt refuses use of rw for transfers.  Ambulation/Gait                 Stairs            Wheelchair Mobility    Modified Rankin (Stroke Patients Only)       Balance Overall balance assessment: Needs assistance Sitting-balance support: No upper extremity supported Sitting balance-Leahy Scale: Good     Standing balance support: Single extremity supported Standing balance-Leahy Scale: Poor Standing balance comment: needing assist with transfers                    Cognition Arousal/Alertness: Awake/alert Behavior During Therapy: Flat affect Overall Cognitive Status: Within Functional Limits for tasks assessed                      Exercises      General Comments        Pertinent Vitals/Pain Pain Assessment: No/denies pain    Home Living                      Prior Function            PT Goals (current goals can now be found in the care plan section) Acute Rehab PT Goals Patient Stated Goal: Get my strength back PT Goal Formulation: With patient Time For Goal Achievement: 08/29/15 Potential to Achieve Goals: Good Progress towards PT goals: Progressing toward goals    Frequency  Min 3X/week    PT Plan Discharge plan needs to be updated  Co-evaluation             End of Session Equipment Utilized During Treatment: Gait belt Activity Tolerance: Patient tolerated treatment well (pt reports fatigue with activity) Patient left: in chair;with call bell/phone within reach     Time: 0913-0927 PT Time Calculation (min) (ACUTE ONLY): 14 min  Charges:  $Gait Training: 8-22 mins                    G Codes:      Cassell Clement, PT, CSCS Pager [phone] Office [phone]  08/25/2015, 10:49 AM

## 2015-08-25 NOTE — Progress Notes (Signed)
[facility] ROUNDING NOTE   Subjective:   Interval History: feels weak today and dizzy on standing   Objective:  Vital signs in last 24 hours:  Temp:  [98.2 F (36.8 C)-98.7 F (37.1 C)] 98.2 F (36.8 C) (05/08 0434) Pulse Rate:  [69-75] 69 (05/08 0434) Resp:  [18-20] 18 (05/08 0434) BP: (128-149)/(48-52) 149/52 mmHg (05/08 0434) SpO2:  [96 %-99 %] 99 % (05/08 0434)  Weight change:  Filed Weights   08/20/15 2009 08/23/15 0900  Weight: 54.4 kg (119 lb 14.9 oz) 55.4 kg (122 lb 2.2 oz)    Intake/Output: I/O last 3 completed shifts: In: 420 [P.O.:420] Out: 0    Intake/Output this shift:  Total I/O In: 120 [P.O.:120] Out: 1 [Stool:1]  General: alert, NAD.  Heart: RRR No R/G/M Lungs: CTA Abdomen: soft , Nondistend / nontender active BS Extremities: No LE edema Dialysis Access: LUA AVF + bruit  HD: [place] TTS BFR 400, DFR Autoflow 1.5 EDW 55 (kg),  3.0 K, 2.25 Ca,  UFR Profile: Profile 2 LUA AVF Hectoral 7 mcg IV q treatment   Basic Metabolic Panel:  Recent Labs Lab 08/19/15 1633 08/20/15 1247 08/21/15 1043 08/23/15 0927  NA 132* 133* 136 134*  K 4.3 3.8 3.8 4.3  CL 98* 96* 98* 96*  CO2 24 27 25 26   GLUCOSE 86 127* 114* 128*  BUN 45* 26* 51* 42*  CREATININE 10.19* 6.16* 7.81* 6.87*  CALCIUM 8.4* 8.7* 8.5* 8.4*  PHOS 4.6 2.9 1.8* 2.2*    Liver Function Tests:  Recent Labs Lab 08/19/15 1633 08/20/15 1247 08/21/15 1043 08/23/15 0927  ALBUMIN 2.4* 2.6* 2.6* 2.4*   No results for input(s): LIPASE, AMYLASE in the last 168 hours. No results for input(s): AMMONIA in the last 168 hours.  CBC:  Recent Labs Lab 08/19/15 1633 08/20/15 1247 08/21/15 1043 08/23/15 0927  WBC 3.4* 2.7* 4.9 4.7  HGB 9.8* 10.6* 9.4* 8.4*  HCT 31.1* 33.9* 31.4* 27.2*  MCV 83.6 83.9 86.5 86.3  PLT 147* 160 140* 139*    Cardiac Enzymes: No results for input(s): CKTOTAL, CKMB, CKMBINDEX, TROPONINI in the last 168 hours.  BNP: Invalid input(s):  POCBNP  CBG:  Recent Labs Lab 08/19/15 2221  GLUCAP 50    Microbiology: Results for orders placed or performed during the hospital encounter of 08/16/15  MRSA PCR Screening     Status: None   Collection Time: 08/16/15 10:13 PM  Result Value Ref Range Status   MRSA by PCR NEGATIVE NEGATIVE Final    Comment:        The GeneXpert MRSA Assay (FDA approved for NASAL specimens only), is one component of a comprehensive MRSA colonization surveillance program. It is not intended to diagnose MRSA infection nor to guide or monitor treatment for MRSA infections.     Coagulation Studies: No results for input(s): LABPROT, INR in the last 72 hours.  Urinalysis: No results for input(s): COLORURINE, LABSPEC, PHURINE, GLUCOSEU, HGBUR, BILIRUBINUR, KETONESUR, PROTEINUR, UROBILINOGEN, NITRITE, LEUKOCYTESUR in the last 72 hours.  Invalid input(s): APPERANCEUR    Imaging: No results found.   Medications:     . aspirin EC  81 mg Oral Daily  . atorvastatin  40 mg Oral q1800  . feeding supplement  1 Container Oral BID BM  . folic acid  1 mg Oral Daily  . heparin  5,000 Units Subcutaneous Q8H  . hydrALAZINE  50 mg Oral Q8H  . levETIRAcetam  1,000 mg Oral Daily  . losartan  100  mg Oral QPM  . multivitamin with minerals  1 tablet Oral Daily  . polyethylene glycol  17 g Oral Daily  . senna-docusate  1 tablet Oral BID  . thiamine  100 mg Oral Daily  . verapamil  120 mg Oral Q8H   sodium chloride, sodium chloride, acetaminophen, alteplase, heparin, lidocaine (PF), lidocaine-prilocaine, LORazepam, ondansetron (ZOFRAN) IV, pentafluoroprop-tetrafluoroeth, senna-docusate, zolpidem  Assessment/ Plan:  1. Hypertensive emergency d/t cocaine abuse: bp now controlled with HD  2. Chest Pain: Troponin 0.13, 0.11, 0.10. No further CP/ Cardiology consulted= Demand ischemia d/t cocaine abuse. Echo 05/03 showed EF 55-60%. Mild concentric LVH.  3. ESRD - TTS @ [facility]. On HD today per schedule.  K+4.3 4. Hypertension/volume - on home meds losartan, hydralazine; also verapamil appears to be new. UFG today 1.5 liters. Did not stand for wt. BP stable, tolerating HD well. Will lower EDW on DC 5. Anemia - Hb 8.4  Drop in hospital --  Will need evaluation check stool and iron stores 6. Metabolic bone disease - Ca 8.4 C Ca 9.68 Phos 2.2. Hold binders/ Cont VDRA 7. Nutrition - Albumin 2.9. Renal diet, renal vit/supplement.  8. Seizure disorder: Per primary. Cont keppra 9. Polysubstance abuse: Ongoing issues. Patient has been counseled multiple times to stop. Patient himself has not sought OP treatment. Asked patient if he'd ever attended NA/AA meetings. "They didn't do nothin' for me" 10. Disposition - considering SNF placement  Plan - HD again Tuesday.     LOS: 8 [place] W @TODAY @9 :31 AM

## 2015-08-25 NOTE — Progress Notes (Signed)
[facility]                                                                              Patient Demographics  [name], is a 60 y.o. male, DOB - [birth year], [id number]  Admit date - 08/16/2015   Admitting Physician Vianne Bulls, MD  Outpatient Primary MD for the patient is No primary care provider on file.  Outpatient specialists:   LOS - 8  days    Chief Complaint  Patient presents with  . Code STEMI       Brief summary   Patient is a 60 year old male with ESRD on HD, polysubstance abuse, hypertension, seizure disorder presented to the ED from his dialysis center with severe acute hypertension, chest pain, and generalized weakness. Patient reported he had been using crack cocaine daily for 5-7 days. He went to his dialysis center for his regularly scheduled session and completed it without incident. Upon completion he was noted to be hypertensive to the 230s/140s with chest pain. He was directed to the emergency department. Upon arrival to the ED blood pressure was 236/149. EKG revealed sinus rhythm with LVH by voltage criteria and T-wave inversions in the inferolateral leads. Initial troponin was 0.13. Nitroglycerin drip was initiated. Cardiology was consulted. Urine drug change showed cocaine positive. Patient reports constipation, and not feeling himself. Patient is asking for short term rehabilitation. Patient was transferred to the floor on 08/19/15   Assessment & Plan    Chest pain With elevated troponins likely due to Demand ischemia from hypertensive emergency - Continue to avoid beta blockers due to cocaine use  - 2-D echo showed EF of 55-60% with grade 1 diastolic dysfunction. Patient was strongly counseled to quit cocaine. - Chest pain has resolved.   -Hypertensive emergency : resolved.   -ESRD on T/Th/Sat HD - For HD on Tuesday if patient is still in the hospital.   -Cocaine / Polysubstance abuse -  Patient was strongly counseled to  quit cocaine. Patient disinterested in discontinuing EtOH or Cocaine. Continue CIWA protocol  -Constipation - Laxatives. Enemas PRN.   -Seizure disorder - Continue Keppra 1000 mg daily, no seizures activity this admission.  - Anemia of CKD   - Deconditioning - PT/OT/Case Management consult - Assess for possible short term rehabilitation.  Code Status: DO NOT RESUSCITATE  Disposition Plan: Possible short term rehab. PT/OT/Case management consulted.     Procedures  Hemodialysis  Consults   Nephrology  DVT Prophylaxis   SCD's  Medications  Scheduled Meds: . aspirin EC  81 mg Oral Daily  . atorvastatin  40 mg Oral q1800  . [START ON 08/26/2015] darbepoetin (ARANESP) injection - DIALYSIS  100 mcg Intravenous Q Tue-HD  . feeding supplement  1 Container Oral BID BM  . folic acid  1 mg Oral Daily  . heparin  5,000 Units Subcutaneous Q8H  . hydrALAZINE  50 mg Oral Q8H  . levETIRAcetam  1,000 mg Oral Daily  . losartan  100 mg Oral QPM  . multivitamin with minerals  1 tablet Oral Daily  . polyethylene glycol  17 g Oral Daily  . senna-docusate  1 tablet  Oral BID  . thiamine  100 mg Oral Daily  . verapamil  120 mg Oral Q8H   Continuous Infusions:   PRN Meds:.sodium chloride, sodium chloride, acetaminophen, alteplase, heparin, lidocaine (PF), lidocaine-prilocaine, LORazepam, ondansetron (ZOFRAN) IV, pentafluoroprop-tetrafluoroeth, senna-docusate, zolpidem   Antibiotics   Anti-infectives    None        Subjective:  Feels better and agrees to go to rehab.  Objective:   Filed Vitals:   08/24/15 1703 08/24/15 2002 08/25/15 0434 08/25/15 0900  BP: 128/51 133/48 149/52 132/48  Pulse: 74 75 69 66  Temp: 98.7 F (37.1 C) 98.4 F (36.9 C) 98.2 F (36.8 C) 98.2 F (36.8 C)  TempSrc: Axillary Oral Oral Oral  Resp:  20 18 20   Height:      Weight:      SpO2: 96% 97% 99% 98%    Intake/Output Summary (Last 24 hours) at 08/25/15 1907 Last data filed at 08/25/15 [id number]   Gross per 24 hour  Intake    120 ml  Output      1 ml  Net    119 ml     Wt Readings from Last 3 Encounters:  08/23/15 55.4 kg (122 lb 2.2 oz)  06/20/15 60.3 kg (132 lb 15 oz)  04/12/15 53 kg (116 lb 13.5 oz)     Exam  General: Alert and oriented x 3, NAD,   CVS: S1 S2 clear, RRR  Respiratory: CTAB  Abdomen: Soft, nontender, nondistended, + bowel sounds  Ext: no cyanosis clubbing or edema  Skin: No rashes    Data Reviewed:  I have personally reviewed following labs and imaging studies  Micro Results Recent Results (from the past 240 hour(s))  MRSA PCR Screening     Status: None   Collection Time: 08/16/15 10:13 PM  Result Value Ref Range Status   MRSA by PCR NEGATIVE NEGATIVE Final    Comment:        The GeneXpert MRSA Assay (FDA approved for NASAL specimens only), is one component of a comprehensive MRSA colonization surveillance program. It is not intended to diagnose MRSA infection nor to guide or monitor treatment for MRSA infections.     Radiology Reports Dg Chest Portable 1 View  08/16/2015  CLINICAL DATA:  Patient from dialysis.  Five day crack cocaine binge EXAM: PORTABLE CHEST 1 VIEW COMPARISON:  3/2/ 17 FINDINGS: Mild cardiac enlargement. No pleural effusion or edema. No airspace consolidation. Degenerative changes are noted within both glenohumeral joints. IMPRESSION: 1. No acute cardiopulmonary abnormalities. Electronically Signed   By: Kerby Moors M.D.   On: 08/16/2015 19:04   Dg Abd Portable 1v  08/22/2015  CLINICAL DATA:  Abdominal pain and constipation EXAM: PORTABLE ABDOMEN - 1 VIEW COMPARISON:  CT abdomen and pelvis Aug 25, 2013 FINDINGS: There is fairly diffuse stool throughout the colon. Stomach is mildly distended with air. There is no small or large bowel dilatation. No air-fluid level. No free air evident. Lung bases clear. Surgical clips are noted in the right upper quadrant. There is arterial vascular calcification in the pelvis.  IMPRESSION: Fairly diffuse stool throughout colon. No bowel obstruction or free air. Stomach is mildly distended with air. Lung bases clear. Electronically Signed   By: Lowella Grip III M.D.   On: 08/22/2015 18:53    CBC  Recent Labs Lab 08/19/15 1633 08/20/15 1247 08/21/15 1043 08/23/15 0927  WBC 3.4* 2.7* 4.9 4.7  HGB 9.8* 10.6* 9.4* 8.4*  HCT 31.1* 33.9* 31.4* 27.2*  PLT  147* 160 140* 139*  MCV 83.6 83.9 86.5 86.3  MCH 26.3 26.2 25.9* 26.7  MCHC 31.5 31.3 29.9* 30.9  RDW 15.4 15.4 15.7* 15.8*    Chemistries   Recent Labs Lab 08/19/15 1633 08/20/15 1247 08/21/15 1043 08/23/15 0927  NA 132* 133* 136 134*  K 4.3 3.8 3.8 4.3  CL 98* 96* 98* 96*  CO2 24 27 25 26   GLUCOSE 86 127* 114* 128*  BUN 45* 26* 51* 42*  CREATININE 10.19* 6.16* 7.81* 6.87*  CALCIUM 8.4* 8.7* 8.5* 8.4*   ------------------------------------------------------------------------------------------------------------------ estimated creatinine clearance is 9.2 mL/min (by C-G formula based on Cr of 6.87). ------------------------------------------------------------------------------------------------------------------ No results for input(s): HGBA1C in the last 72 hours. ------------------------------------------------------------------------------------------------------------------ No results for input(s): CHOL, HDL, LDLCALC, TRIG, CHOLHDL, LDLDIRECT in the last 72 hours. ------------------------------------------------------------------------------------------------------------------ No results for input(s): TSH, T4TOTAL, T3FREE, THYROIDAB in the last 72 hours.  Invalid input(s): FREET3 ------------------------------------------------------------------------------------------------------------------  Recent Labs  08/25/15 0958  TIBC 206*  IRON 90    Coagulation profile No results for input(s): INR, PROTIME in the last 168 hours.  No results for input(s): DDIMER in the last 72  hours.  Cardiac Enzymes No results for input(s): CKMB, TROPONINI, MYOGLOBIN in the last 168 hours.  Invalid input(s): CK ------------------------------------------------------------------------------------------------------------------ Invalid input(s): POCBNP  No results for input(s): GLUCAP in the last 72 hours.   Areyana Leoni M.D. [facility] 08/25/2015, 7:07 PM   Between 7am to 7pm - call Pager - [phone]  After 7pm go to [email or web address] - password [email or web address]  Call night coverage person covering after 7pm

## 2015-08-25 NOTE — NC FL2 (Signed)
[place] LEVEL OF CARE SCREENING TOOL     IDENTIFICATION  Patient Name: [name] Birthdate: [birth year] Sex: male Admission Date (Current Location): 08/16/2015  [place] and [place] Number:  Kathleen Argue [id number] [place] and Address:  [facility], [address], [place], [place] [address]      Provider Number: [id number]  Attending Physician Name and Address:  Hosie Poisson, MD  Relative Name and Phone Number:       Current Level of Care:   Recommended Level of Care:   Prior Approval Number:    Date Approved/Denied:   PASRR Number:  (Submitted for PASRR 08/25/15)  Discharge Plan: SNF    Current Diagnoses: Patient Active Problem List   Diagnosis Date Noted  . Physical deconditioning   . Constipation   . EKG, abnormal   . Alcoholism /alcohol abuse ([place])   . NSTEMI (non-ST elevated myocardial infarction) ([place]) 08/16/2015  . Hypertensive emergency 08/16/2015  . Hepatitis C antibody test positive 06/23/2015  . History of hepatitis B virus infection 06/20/2015  . Acute encephalopathy 06/20/2015  . Acute uremia   . Hyperkalemia   . Alcohol withdrawal ([place]) 06/19/2015  . Acute GI bleeding 04/12/2015  . LVH (left ventricular hypertrophy) due to hypertensive disease 04/12/2015  . Prolonged Q-T interval on ECG 04/12/2015  . Bleeding gastrointestinal   . Anemia of chronic kidney failure 04/26/2014  . Hyperparathyroidism, secondary renal ([place]) 04/26/2014  . Protein-calorie malnutrition, severe ([place]) 01/25/2014  . Optic neuritis, left 01/19/2014  . Acute blood loss anemia 08/21/2013  . Cocaine abuse 03/02/2012  . Tobacco abuse 03/02/2012  . Hypertension   . ESRD on hemodialysis ([place])   . Seizure disorder ([place])     Orientation RESPIRATION BLADDER Height & Weight     Self, Time, Situation, Place  Normal Continent Weight: 122 lb 2.2 oz (55.4 kg) (Bed-weighed. ) Height:  5\' 8"  (172.7 cm)  BEHAVIORAL SYMPTOMS/MOOD NEUROLOGICAL  BOWEL NUTRITION STATUS    Convulsions/Seizures (Patient has history of seizure disorder) Continent Diet (Renal with 1500 mL fluid restriction)  AMBULATORY STATUS COMMUNICATION OF NEEDS Skin   Extensive Assist (Patient declined ambulating on 5/5 (evaluation) and on 5/8) Verbally Normal                       Personal Care Assistance Level of Assistance  Bathing, Feeding, Dressing Bathing Assistance: Maximum assistance Feeding assistance: Independent Dressing Assistance: Maximum assistance     Functional Limitations Info  Sight, Hearing, Speech Sight Info: Adequate Hearing Info: Adequate Speech Info: Adequate    SPECIAL CARE FACTORS FREQUENCY  PT (By licensed PT), OT (By licensed OT)     PT Frequency: PT evlauation 5/5 and a minimum of 3X per week recommended OT Frequency: OT evaluation 5/8 and a minimum of 2X per week recmmended            Contractures Contractures Info: Not present    Additional Factors Info  Code Status, Allergies Code Status Info: DNR Allergies Info: Reglan           Current Medications (08/25/2015):  This is the current hospital active medication list Current Facility-Administered Medications  Medication Dose Route Frequency Provider Last Rate Last Dose  . 0.9 %  sodium chloride infusion  100 mL Intravenous PRN Valentina Gu, NP      . 0.9 %  sodium chloride infusion  100 mL Intravenous PRN Valentina Gu, NP      . acetaminophen (TYLENOL)  tablet 650 mg  650 mg Oral Q4H PRN Vianne Bulls, MD   650 mg at 08/24/15 0252  . alteplase (CATHFLO ACTIVASE) injection 2 mg  2 mg Intracatheter Once PRN Valentina Gu, NP      . aspirin EC tablet 81 mg  81 mg Oral Daily Vianne Bulls, MD   81 mg at 08/25/15 1013  . atorvastatin (LIPITOR) tablet 40 mg  40 mg Oral q1800 Vianne Bulls, MD   40 mg at 08/25/15 1637  . [START ON 08/26/2015] Darbepoetin Alfa (ARANESP) injection 100 mcg  100 mcg Intravenous Q Tue-HD Edrick Oh, MD      . feeding  supplement (BOOST / RESOURCE BREEZE) liquid 1 Container  1 Container Oral BID BM Ripudeep Krystal Eaton, MD   1 Container at 08/20/15 1545  . folic acid (FOLVITE) tablet 1 mg  1 mg Oral Daily Cherene Altes, MD   1 mg at 08/25/15 1012  . heparin injection 1,000 Units  1,000 Units Dialysis PRN Valentina Gu, NP      . heparin injection 5,000 Units  5,000 Units Subcutaneous Q8H Vianne Bulls, MD   5,000 Units at 08/24/15 2201  . hydrALAZINE (APRESOLINE) tablet 50 mg  50 mg Oral Q8H Ripudeep K Rai, MD   50 mg at 08/25/15 1638  . levETIRAcetam (KEPPRA) tablet 1,000 mg  1,000 mg Oral Daily Vianne Bulls, MD   1,000 mg at 08/25/15 1012  . lidocaine (PF) (XYLOCAINE) 1 % injection 5 mL  5 mL Intradermal PRN Valentina Gu, NP      . lidocaine-prilocaine (EMLA) cream 1 application  1 application Topical PRN Valentina Gu, NP      . LORazepam (ATIVAN) injection 2-3 mg  2-3 mg Intravenous Q4H PRN Cherene Altes, MD   3 mg at 08/19/15 2218  . losartan (COZAAR) tablet 100 mg  100 mg Oral QPM Vianne Bulls, MD   100 mg at 08/25/15 1639  . multivitamin with minerals tablet 1 tablet  1 tablet Oral Daily Vianne Bulls, MD   1 tablet at 08/25/15 1012  . ondansetron (ZOFRAN) injection 4 mg  4 mg Intravenous Q6H PRN Vianne Bulls, MD      . pentafluoroprop-tetrafluoroeth (GEBAUERS) aerosol 1 application  1 application Topical PRN Valentina Gu, NP      . polyethylene glycol (MIRALAX / GLYCOLAX) packet 17 g  17 g Oral Daily Bonnell Public, MD   17 g at 08/25/15 1011  . senna-docusate (Senokot-S) tablet 1 tablet  1 tablet Oral BID Ripudeep Krystal Eaton, MD   1 tablet at 08/25/15 1017  . senna-docusate (Senokot-S) tablet 2 tablet  2 tablet Oral QHS PRN Ritta Slot, NP   2 tablet at 08/23/15 0034  . thiamine (VITAMIN B-1) tablet 100 mg  100 mg Oral Daily Cherene Altes, MD   100 mg at 08/25/15 1013  . verapamil (CALAN) tablet 120 mg  120 mg Oral Q8H Cherene Altes, MD   120 mg at 08/25/15 1638   . zolpidem (AMBIEN) tablet 5 mg  5 mg Oral QHS PRN Gardiner Barefoot, NP   5 mg at 08/25/15 0022     Discharge Medications: Please see discharge summary for a list of discharge medications.  Relevant Imaging Results:  Relevant Lab Results:   Additional Information ss#[id number]. Patient has dialysis TTS at [facility], [facility], [place]

## 2015-08-25 NOTE — Care Management Note (Signed)
Case Management Note  Patient Details  Name: [name] MRN: [id number] Date of Birth: October 23, 1955  Subjective/Objective:  ESRD, Hypertension                  Action/Plan: Discharge Planning:  NCM spoke to pt at bedside. Pt states he lives alone and prefers to go to SNF-rehab. CSW referral for SNF placement.      Expected Discharge Date:  08/23/15               Expected Discharge Plan:  Skilled Nursing Facility  In-House Referral:  Clinical Social Work  Discharge planning Services  CM Consult  Post Acute Care Choice:  NA Choice offered to:  NA  DME Arranged:  N/A DME Agency:  NA  HH Arranged:  NA HH Agency:  NA  Status of Service:  Completed, signed off  Medicare Important Message Given:  Yes Date Medicare IM Given:    Medicare IM give by:    Date Additional Medicare IM Given:    Additional Medicare Important Message give by:     If discussed at [place] of Stay Meetings, dates discussed:    Additional Comments:  Erenest Rasher, RN 08/25/2015, 5:04 PM
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
## 2015-08-26 LAB — RENAL FUNCTION PANEL
Albumin: 2.4 g/dL — ABNORMAL LOW (ref 3.5–5.0)
Anion gap: 13 (ref 5–15)
BUN: 69 mg/dL — AB (ref 6–20)
CHLORIDE: 97 mmol/L — AB (ref 101–111)
CO2: 25 mmol/L (ref 22–32)
Calcium: 8.7 mg/dL — ABNORMAL LOW (ref 8.9–10.3)
Creatinine, Ser: 7.55 mg/dL — ABNORMAL HIGH (ref 0.61–1.24)
GFR calc Af Amer: 8 mL/min — ABNORMAL LOW (ref >60)
GFR calc non Af Amer: 7 mL/min — ABNORMAL LOW (ref >60)
Glucose, Bld: 120 mg/dL — ABNORMAL HIGH (ref 65–99)
Phosphorus: 2.3 mg/dL — ABNORMAL LOW (ref 2.5–4.6)
Potassium: 4.2 mmol/L (ref 3.5–5.1)
Sodium: 135 mmol/L (ref 135–145)

## 2015-08-26 LAB — CBC
HCT: 25.9 % — ABNORMAL LOW (ref 39.0–52.0)
Hemoglobin: 8.2 g/dL — ABNORMAL LOW (ref 13.0–17.0)
MCH: 27.1 pg (ref 26.0–34.0)
MCHC: 31.7 g/dL (ref 30.0–36.0)
MCV: 85.5 fL (ref 78.0–100.0)
PLATELETS: 206 10*3/uL (ref 150–400)
RBC: 3.03 MIL/uL — ABNORMAL LOW (ref 4.22–5.81)
RDW: 15.9 % — AB (ref 11.5–15.5)
WBC: 4.3 10*3/uL (ref 4.0–10.5)

## 2015-08-26 MED ORDER — LIDOCAINE-PRILOCAINE 2.5-2.5 % EX CREA: 1.0000 "application " | TOPICAL_CREAM | CUTANEOUS | Status: DC | PRN

## 2015-08-26 MED ORDER — DARBEPOETIN ALFA 100 MCG/0.5ML IJ SOSY
PREFILLED_SYRINGE | INTRAMUSCULAR | Status: AC
  Administered 2015-08-26: 100 ug via INTRAVENOUS
  Filled 2015-08-26: qty 0.5

## 2015-08-26 MED ORDER — PENTAFLUOROPROP-TETRAFLUOROETH EX AERO: 1.0000 "application " | INHALATION_SPRAY | CUTANEOUS | Status: DC | PRN

## 2015-08-26 MED ORDER — POLYETHYLENE GLYCOL 3350 17 G PO PACK: 17.0000 g | PACK | Freq: Every day | ORAL | Status: AC

## 2015-08-26 MED ORDER — SODIUM CHLORIDE 0.9 % IV SOLN: 100.0000 mL | INTRAVENOUS | Status: DC | PRN

## 2015-08-26 MED ORDER — LIDOCAINE HCL (PF) 1 % IJ SOLN: 5.0000 mL | INTRAMUSCULAR | Status: DC | PRN

## 2015-08-26 MED ORDER — HEPARIN SODIUM (PORCINE) 1000 UNIT/ML DIALYSIS: 1000.0000 [IU] | INTRAMUSCULAR | Status: DC | PRN

## 2015-08-26 MED ORDER — ALTEPLASE 2 MG IJ SOLR: 2.0000 mg | Freq: Once | INTRAMUSCULAR | Status: DC | PRN

## 2015-08-26 NOTE — Clinical Social Work Placement (Signed)
   CLINICAL SOCIAL WORK PLACEMENT  NOTE 08/26/15 - DISCHARGED TO [facility]  Date:  08/26/2015  Patient Details  Name: [name] MRN: [id number] Date of Birth: [birth year]  Clinical Social Work is seeking post-discharge placement for this patient at the [place] level of care (*CSW will initial, date and re-position this form in  chart as items are completed):  Yes   Patient/family provided with [facility] Work Department's list of facilities offering this level of care within the geographic area requested by the patient (or if unable, by the patient's family).  Yes   Patient/family informed of their freedom to choose among providers that offer the needed level of care, that participate in Medicare, Medicaid or managed care program needed by the patient, have an available bed and are willing to accept the patient.  Yes   Patient/family informed of [facility]'s ownership interest in [facility] and [facility], as well as of the fact that they are under no obligation to receive care at these facilities.  PASRR submitted to EDS on 08/25/15     PASRR number received on 08/26/15 ([id number], effective 5/9-09/25/15.)     Existing PASRR number confirmed on       FL2 transmitted to all facilities in geographic area requested by pt/family on 08/25/15     FL2 transmitted to all facilities within larger geographic area on       Patient informed that his/her managed care company has contracts with or will negotiate with certain facilities, including the following:        Yes   Patient/family informed of bed offers received.  Patient chooses bed at [facility]     Physician recommends and patient chooses bed at      Patient to be transferred to [facility] on 08/26/15.  Patient to be transferred to facility by Ambulance [facility])     Patient family notified on   of transfer.  Name of family member notified:         PHYSICIAN       Additional Comment:    _______________________________________________ Sable Feil, LCSW 08/26/2015, 4:32 PM

## 2015-08-26 NOTE — Clinical Social Work Note (Signed)
Clinical Social Work Assessment  Patient Details  Name: [name] MRN: [id number] Date of Birth: 28-Jun-1955  Date of referral:  08/25/15               Reason for consult:  Facility Placement                Permission sought to share information with:  Customer service manager (t) Permission granted to share information::  Yes, Verbal Permission Granted  Name::        Agency::  [place] facilities in [place]  Relationship::     Contact Information:     Housing/Transportation Living arrangements for the past 2 months:  [place] of Information:  Patient Patient Interpreter Needed:  None Criminal Activity/Legal Involvement Pertinent to Current Situation/Hospitalization:  No - Comment as needed Significant Relationships:  Friend [name] [phone] (mobile)) Lives with:  Self Do you feel safe going back to the place where you live?  Yes (Patient feels safe/comfortable at home, but is in agreement with ST rehab) Need for family participation in patient care:  No (Coment)  Care giving concerns:  Patient expressed that he needed to go to a facility for rehab as he lives alone.   Social Worker assessment / plan:  CSW talked with patient on 5/8 regarding recommendation of ST rehab. CSW clarified patient's address as face sheet has [place], [place]. Patient reported that he lives in [facility] [address] [place], and goes to dialysis at [facility], using [place] transportation.  Facility search process explained and [name] provided with list of skilled facilities in [facility]. Patient amenable to [facility] or [facility].  Employment status:  Disabled (Comment on whether or not currently receiving Disability) Insurance information:  Medicare, Medicaid In [place] PT Recommendations:  [place] / Referral to community resources:  [place] (Skilled facility list for [facility]  provided.)  Patient/Family's Response to care:  Patient expressed no concerns regarding care during hospitalization.  Patient/Family's Understanding of and Emotional Response to Diagnosis, Current Treatment, and Prognosis:  Not discussed.  Emotional Assessment Appearance:  Appears older than stated age Attitude/Demeanor/Rapport:  Other (Appropriate) Affect (typically observed):  Calm, Appropriate, Pleasant Orientation:  Oriented to Self, Oriented to Place, Oriented to  Time, Oriented to Situation Alcohol / Substance use:  Tobacco Use, Alcohol Use, Illicit Drugs (Patient smokes cigarettes, drinks approx. 14.4 ounces of alcohol per week and uses crack, cocaine and marijuana ) Psych involvement (Current and /or in the community):  No (Comment)  Discharge Needs  Concerns to be addressed:  Discharge Planning Concerns Readmission within the last 30 days:  No Current discharge risk:  Substance Abuse Barriers to Discharge:  No Barriers Identified   [name] Feil, LCSW 08/26/2015, 12:26 PM

## 2015-08-26 NOTE — Discharge Summary (Signed)
Physician Discharge Summary  Patient ID: [name] MRN: [id number] DOB/AGE: January 30, 1956 60 y.o.  Admit date: 08/16/2015 Discharge date: 08/26/2015  Admission Diagnoses:  Discharge Diagnoses:  Principal Problem:   NSTEMI (non-ST elevated myocardial infarction) ([place]) Active Problems:   Hypertension   ESRD on hemodialysis ([place])   Seizure disorder ([place])   Tobacco abuse   Protein-calorie malnutrition, severe ([place])   Anemia of chronic kidney failure   Hypertensive emergency   EKG, abnormal   Alcoholism /alcohol abuse ([place])   Constipation   Physical deconditioning   Discharged Condition: stable  Hospital Course:  60 year old male with ESRD on HD, polysubstance abuse, hypertension, seizure disorder presented to the ED from his dialysis center with severe acute hypertension, chest pain, and generalized weakness. Patient reported he had been using crack cocaine daily for 5-7 days. He went to his dialysis center for his regularly scheduled session and completed it without incident. Upon completion he was noted to be hypertensive to the 230s/140s with chest pain. He was directed to the emergency department. Upon arrival to the ED blood pressure was 236/149. EKG revealed sinus rhythm with LVH by voltage criteria and T-wave inversions in the inferolateral leads. Initial troponin was 0.13. Nitroglycerin drip was initiated. Cardiology was consulted. Urine drug change showed cocaine positive. Patient was admitted for further assessment and management. Chest pain was thought to be secondary to cocaine use anda accelerated hypertension. Patient was counseled to quit cocaine use. Blood pressure was gradually controlled. Patient is chest pain free. Blood pressure control has improved signficantly.  During the hospital stay, patient reported being constipated. Patient has been started on laxatives. Patient continued to feel weak during the hospital stay. PT/OT was called, and short term rehab was  recommended. Patient will continue hemodialysis on Tuesday, Thursday and Saturday. Hemodialysis was continued during the hospital stay.  Patient will be discharged after hemodialysis today.  Consults: Nephrology Discharge medication - Please see the Med. Rec.  Discharge Exam: Blood pressure 167/84, pulse 89, temperature 98.8 F (37.1 C), temperature source Oral, resp. rate 16, height 5\' 8"  (1.727 m), weight 63.4 kg (139 lb 12.4 oz), SpO2 96 %.   Disposition: ED Dismiss - Never Arrived  Discharge Instructions    Call MD for:    Complete by:  As directed   Call MD for worsening symptoms     Diet - low sodium heart healthy    Complete by:  As directed      Increase activity slowly    Complete by:  As directed             Medication List    TAKE these medications        aspirin 81 MG EC tablet  Take 1 tablet (81 mg total) by mouth daily.     atorvastatin 40 MG tablet  Commonly known as:  LIPITOR  Take 1 tablet (40 mg total) by mouth at bedtime.     calcium acetate 667 MG capsule  Commonly known as:  PHOSLO  Take 2,001 mg by mouth 3 (three) times daily with meals.     doxercalciferol 4 MCG/2ML injection  Commonly known as:  HECTOROL  Inject 1.5 mLs (3 mcg total) into the vein Every Tuesday,Thursday,and Saturday with dialysis.     hydrALAZINE 50 MG tablet  Commonly known as:  APRESOLINE  Take 1 tablet (50 mg total) by mouth 3 (three) times daily.     levETIRAcetam 1000 MG tablet  Commonly known as:  KEPPRA  Take 1  tablet (1,000 mg total) by mouth daily.     losartan 100 MG tablet  Commonly known as:  COZAAR  Take 100 mg by mouth every evening.     polyethylene glycol packet  Commonly known as:  MIRALAX / GLYCOLAX  Take 17 g by mouth daily as needed for moderate constipation.     polyethylene glycol packet  Commonly known as:  MIRALAX / GLYCOLAX  Take 17 g by mouth daily.     senna-docusate 8.6-50 MG tablet  Commonly known as:  Senokot-S  Take 1 tablet by  mouth 2 (two) times daily. For constipation     verapamil 120 MG tablet  Commonly known as:  CALAN  Take 1 tablet (120 mg total) by mouth every 8 (eight) hours.           Follow-up Information    Follow up with [facility]. Schedule an appointment as soon as possible for a visit in 10 days.   Why:  for hospital follow-up   Contact information:   [place] [phone]      Follow up In 1 week.   Why:  Follow up with PCP and Nephrology. Continue Hemodialysis TTS      Signed: Bonnell Public 08/26/2015, 3:52 PM

## 2015-08-26 NOTE — Progress Notes (Signed)
Physical Therapy Treatment Patient Details Name: [name] MRN: [id number] DOB: 08/09/1955 Today's Date: 08/26/2015    History of Present Illness Patient is a 60 year old male with ESRD on HD, polysubstance abuse, hypertension, seizure disorder presented to the ED from his dialysis center with severe acute hypertension, chest pain, and generalized weakness. Patient reported he had been using crack cocaine daily for 5-7 days. He went to his dialysis center for his regularly scheduled session and completed it without incident. Upon completion he was noted to be hypertensive to the 230s/140s with chest pain.    PT Comments    Pt progressing gradually during PT sessions. Able to ambulate 25 feet today with rw and min guard assistance. Pt had 1 episode of Lt knee buckling with independent recovery. Based upon the patient's current mobility, recommending SNF for further rehabilitation prior to D/C to home.   Follow Up Recommendations  SNF     Equipment Recommendations  Rolling walker with 5" wheels    Recommendations for Other Services       Precautions / Restrictions Precautions Precautions: Fall Restrictions Weight Bearing Restrictions: No    Mobility  Bed Mobility Overal bed mobility: Needs Assistance Bed Mobility: Supine to Sit     Supine to sit: Min assist     General bed mobility comments: Pt requesting assist with trunk with sitting.   Transfers Overall transfer level: Needs assistance Equipment used: Rolling walker (2 wheeled) Transfers: Sit to/from Stand Sit to Stand: Min guard Stand pivot transfers: Min guard (bed to [facility])       General transfer comment: min guard for safety, no knee buckling. Pt transferring from bed and from [facility].   Ambulation/Gait Ambulation/Gait assistance: Min guard Ambulation Distance (Feet): 25 Feet Assistive device: Rolling walker (2 wheeled) Gait Pattern/deviations: Step-through pattern;Decreased step length - right;Decreased step  length - left Gait velocity: decreased   General Gait Details: 1 episode of lt knee buckling with independent recovery, pt unwilling to attempt any further ambulation following.    Stairs            Wheelchair Mobility    Modified Rankin (Stroke Patients Only)       Balance Overall balance assessment: Needs assistance Sitting-balance support: No upper extremity supported Sitting balance-Leahy Scale: Good     Standing balance support: Bilateral upper extremity supported Standing balance-Leahy Scale: Poor Standing balance comment: using rw for support.                     Cognition Arousal/Alertness: Awake/alert Behavior During Therapy: WFL for tasks assessed/performed Overall Cognitive Status: Within Functional Limits for tasks assessed                      Exercises      General Comments General comments (skin integrity, edema, etc.): Pt resistant to encouragement to advance activity.       Pertinent Vitals/Pain Pain Assessment: No/denies pain    Home Living                      Prior Function            PT Goals (current goals can now be found in the care plan section) Acute Rehab PT Goals Patient Stated Goal: get some sleep.  PT Goal Formulation: With patient Time For Goal Achievement: 08/29/15 Potential to Achieve Goals: Good Progress towards PT goals: Progressing toward goals    Frequency  Min 3X/week    PT  Plan Discharge plan needs to be updated    Co-evaluation             End of Session Equipment Utilized During Treatment: Gait belt Activity Tolerance: Patient tolerated treatment well Patient left: in bed;with call bell/phone within reach;with bed alarm set (pt refusing chair, requests return to bed)     Time: [id number] PT Time Calculation (min) (ACUTE ONLY): 15 min  Charges:  $Gait Training: 8-22 mins                    G Codes:      Cassell Clement, PT, CSCS Pager [phone] Office [phone]  08/26/2015, 12:43 PM

## 2015-08-26 NOTE — Progress Notes (Signed)
Nutrition Follow-up  DOCUMENTATION CODES:   Severe malnutrition in context of chronic illness, Underweight  INTERVENTION:  Encourage adequate PO intake.   RD to continue to monitor for needs.   NUTRITION DIAGNOSIS:   Malnutrition related to chronic illness as evidenced by severe depletion of body fat, severe depletion of muscle mass; ongoing  GOAL:   Patient will meet greater than or equal to 90% of their needs; met  MONITOR:   PO intake, Supplement acceptance, Weight trends, Labs, I & O's  REASON FOR ASSESSMENT:    (Low BMI)    ASSESSMENT:   60 y.o. male with ESRD on hemodialysis TTS at [facility]. PMH significant for hypertensive, hepatitis C, hepatitis B core antibody positive, GI bleed, Chronic diastolic HF, medical noncompliance, seizure disorder, cocaine abuse. He presented to ED 08/16/15 with hypertensive emergency, weakness and chest pain. Patient admitted that he has been doing crack for last 5-7 days. Did attend HD 08/16/15 but was found to be hypertensive post HD and was sent to ED for evaluation.   Meal completion has been 100%. Intake has been adequate. Pt has Boost Breeze ordered and has been refusing them. RD to discontinue order. Labs and medications reviewed. Plans for short term rehabilitation.   Diet Order:  Diet renal with fluid restriction Fluid restriction:: 1500 mL Fluid; Room service appropriate?: Yes; Fluid consistency:: Thin  Skin:  Reviewed, no issues  Last BM:  5/9  Height:   Ht Readings from Last 1 Encounters:  08/16/15 5' 8"  (1.727 m)    Weight:   Wt Readings from Last 1 Encounters:  08/23/15 122 lb 2.2 oz (55.4 kg)    Ideal Body Weight:  70 kg  BMI:  Body mass index is 18.57 kg/(m^2).  Estimated Nutritional Needs:   Kcal:  1800-2000  Protein:  85-95 grams  Fluid:  1.5 L/day  EDUCATION NEEDS:   No education needs identified at this time  Corrin Parker, MS, RD, LDN Pager # [phone] After  hours/ weekend pager # [phone]

## 2015-08-26 NOTE — Progress Notes (Signed)
Pt discharged to [facility] via [place]. Two peripheral right forearm and right wrist IV's removed. Tele DC'd. All belongings sent with patient.  Shelbie Hutching, RN, BSN

## 2015-08-26 NOTE — Progress Notes (Signed)
Subjective:  No cos  For hd today/ ??rehab admit "I can't  Walk past my door ,live alone " Objective Vital signs in last 24 hours: Filed Vitals:   08/25/15 0900 08/25/15 2001 08/26/15 0545 08/26/15 0750  BP: 132/48 130/57 165/62 176/67  Pulse: 66 71 78 83  Temp: 98.2 F (36.8 C) 98.7 F (37.1 C) 97.9 F (36.6 C) 98.8 F (37.1 C)  TempSrc: Oral Oral Oral Oral  Resp: 20 18 17 18   Height:      Weight:      SpO2: 98% 98% 98% 94%   Weight change:  Physical Exam General: alert, thin chronically Ill thin AAM NAD.  Heart: RRR No R/G/M Lungs: CTA Abdomen: soft , Nondistend / nontender active BS Extremities: No LE edema Dialysis Access: LUA AVF + bruit   OP HD: [place] TTS/ EDW 55 (kg),  3.0 K, 2.25 Ca, UFR Profile: Profile 2/ LUA AVF Hectoral 7 mcg IV q treatment   Problem/Plan: 1. Hypertensive emergency d/t cocaine abuse: bp now controlled with HD/ and bp meds. Cardiology signed off. No further chest pain.  2. Chest Pain: Demand ischemia d/t cocaine abuse. Echo 05/03 showed EF 55-60%. Mild concentric LVH. / Cardiology , signed off 3. ESRD - TTS @ [facility]. On HD today per schedule. K+4.3 4. Hypertension/volume - on home meds losartan, hydralazine; also verapamil appears to be new. UFG today 1.5 liters. Did not stand for wt. BP stable, tolerating HD well. Will lower EDW on DC 5. AnemiaOF ESRD - HGB 9.7. >10.6> 8.6  Not on OP ESA, last in-center HGB 13.2 (08/07/15). aranesp 40 mcg IV yest wed was incre to 100 / Iron ok  6. Metabolic bone disease - Ca 8.4 C Ca 9.68 Phos 2.2. Hold binders/ Cont VDRA 7. Nutrition - Albumin 2.4 Renal diet, renal vit/supplement.  8. Seizure disorder: Per primary. Cont keppra 9. Debilitation = needs Rehab vs SNHP 10. Polysubstance abuse: Ongoing issues. Patient has been counseled multiple times to stop. Patient himself has not sought OP treatment.  Ernest Haber, PA-C [facility] Beeper [phone] 08/26/2015,10:44 AM  LOS: 9 days    Labs: Basic Metabolic Panel:  Recent Labs Lab 08/20/15 1247 08/21/15 1043 08/23/15 0927  NA 133* 136 134*  K 3.8 3.8 4.3  CL 96* 98* 96*  CO2 27 25 26   GLUCOSE 127* 114* 128*  BUN 26* 51* 42*  CREATININE 6.16* 7.81* 6.87*  CALCIUM 8.7* 8.5* 8.4*  PHOS 2.9 1.8* 2.2*   Liver Function Tests:  Recent Labs Lab 08/20/15 1247 08/21/15 1043 08/23/15 0927  ALBUMIN 2.6* 2.6* 2.4*   No results for input(s): LIPASE, AMYLASE in the last 168 hours. No results for input(s): AMMONIA in the last 168 hours. CBC:  Recent Labs Lab 08/19/15 1633 08/20/15 1247 08/21/15 1043 08/23/15 0927  WBC 3.4* 2.7* 4.9 4.7  HGB 9.8* 10.6* 9.4* 8.4*  HCT 31.1* 33.9* 31.4* 27.2*  MCV 83.6 83.9 86.5 86.3  PLT 147* 160 140* 139*   Cardiac Enzymes: No results for input(s): CKTOTAL, CKMB, CKMBINDEX, TROPONINI in the last 168 hours. CBG:  Recent Labs Lab 08/19/15 2221  GLUCAP 80    Studies/Results: No results found. Medications:   . aspirin EC  81 mg Oral Daily  . atorvastatin  40 mg Oral q1800  . darbepoetin (ARANESP) injection - DIALYSIS  100 mcg Intravenous Q Tue-HD  . feeding supplement  1 Container Oral BID BM  . folic acid  1 mg Oral Daily  . heparin  5,000 Units Subcutaneous Q8H  . hydrALAZINE  50 mg Oral Q8H  . levETIRAcetam  1,000 mg Oral Daily  . losartan  100 mg Oral QPM  . multivitamin with minerals  1 tablet Oral Daily  . polyethylene glycol  17 g Oral Daily  . senna-docusate  1 tablet Oral BID  . thiamine  100 mg Oral Daily  . verapamil  120 mg Oral Q8H

## 2015-09-08 ENCOUNTER — Encounter: Payer: Self-pay | Admitting: Internal Medicine

## 2015-09-17 ENCOUNTER — Encounter (HOSPITAL_COMMUNITY): Payer: Self-pay

## 2015-09-17 ENCOUNTER — Observation Stay (HOSPITAL_COMMUNITY)
Admission: EM | Admit: 2015-09-17 | Discharge: 2015-09-19 | Disposition: A | Discharge status: Home / Self Care | Payer: Medicare Other | Attending: Internal Medicine | Admitting: Internal Medicine

## 2015-09-17 DIAGNOSIS — N186 End stage renal disease: Secondary | ICD-10-CM | POA: Diagnosis present

## 2015-09-17 DIAGNOSIS — R5381 Other malaise: Secondary | ICD-10-CM | POA: Diagnosis present

## 2015-09-17 DIAGNOSIS — Z833 Family history of diabetes mellitus: Secondary | ICD-10-CM | POA: Insufficient documentation

## 2015-09-17 DIAGNOSIS — Z87820 Personal history of traumatic brain injury: Secondary | ICD-10-CM | POA: Insufficient documentation

## 2015-09-17 DIAGNOSIS — Z8619 Personal history of other infectious and parasitic diseases: Secondary | ICD-10-CM | POA: Insufficient documentation

## 2015-09-17 DIAGNOSIS — I132 Hypertensive heart and chronic kidney disease with heart failure and with stage 5 chronic kidney disease, or end stage renal disease: Secondary | ICD-10-CM | POA: Diagnosis not present

## 2015-09-17 DIAGNOSIS — Z720 Tobacco use: Secondary | ICD-10-CM | POA: Diagnosis present

## 2015-09-17 DIAGNOSIS — F1910 Other psychoactive substance abuse, uncomplicated: Secondary | ICD-10-CM | POA: Diagnosis present

## 2015-09-17 DIAGNOSIS — D649 Anemia, unspecified: Secondary | ICD-10-CM

## 2015-09-17 DIAGNOSIS — Z86010 Personal history of colonic polyps: Secondary | ICD-10-CM | POA: Insufficient documentation

## 2015-09-17 DIAGNOSIS — N189 Chronic kidney disease, unspecified: Secondary | ICD-10-CM

## 2015-09-17 DIAGNOSIS — Z7982 Long term (current) use of aspirin: Secondary | ICD-10-CM | POA: Insufficient documentation

## 2015-09-17 DIAGNOSIS — D638 Anemia in other chronic diseases classified elsewhere: Secondary | ICD-10-CM | POA: Insufficient documentation

## 2015-09-17 DIAGNOSIS — E875 Hyperkalemia: Secondary | ICD-10-CM | POA: Diagnosis not present

## 2015-09-17 DIAGNOSIS — Z992 Dependence on renal dialysis: Secondary | ICD-10-CM | POA: Diagnosis not present

## 2015-09-17 DIAGNOSIS — M1990 Unspecified osteoarthritis, unspecified site: Secondary | ICD-10-CM | POA: Diagnosis not present

## 2015-09-17 DIAGNOSIS — K922 Gastrointestinal hemorrhage, unspecified: Secondary | ICD-10-CM | POA: Insufficient documentation

## 2015-09-17 DIAGNOSIS — F17210 Nicotine dependence, cigarettes, uncomplicated: Secondary | ICD-10-CM | POA: Diagnosis not present

## 2015-09-17 DIAGNOSIS — G40909 Epilepsy, unspecified, not intractable, without status epilepticus: Secondary | ICD-10-CM

## 2015-09-17 DIAGNOSIS — H469 Unspecified optic neuritis: Secondary | ICD-10-CM | POA: Insufficient documentation

## 2015-09-17 DIAGNOSIS — E785 Hyperlipidemia, unspecified: Secondary | ICD-10-CM | POA: Diagnosis not present

## 2015-09-17 DIAGNOSIS — R7989 Other specified abnormal findings of blood chemistry: Secondary | ICD-10-CM | POA: Diagnosis present

## 2015-09-17 DIAGNOSIS — D631 Anemia in chronic kidney disease: Secondary | ICD-10-CM | POA: Diagnosis present

## 2015-09-17 DIAGNOSIS — K921 Melena: Secondary | ICD-10-CM | POA: Insufficient documentation

## 2015-09-17 DIAGNOSIS — I5032 Chronic diastolic (congestive) heart failure: Secondary | ICD-10-CM | POA: Insufficient documentation

## 2015-09-17 DIAGNOSIS — Z79899 Other long term (current) drug therapy: Secondary | ICD-10-CM | POA: Insufficient documentation

## 2015-09-17 DIAGNOSIS — E43 Unspecified severe protein-calorie malnutrition: Secondary | ICD-10-CM | POA: Diagnosis present

## 2015-09-17 LAB — BASIC METABOLIC PANEL
ANION GAP: 21 — AB (ref 5–15)
BUN: 163 mg/dL — AB (ref 6–20)
CHLORIDE: 95 mmol/L — AB (ref 101–111)
CO2: 16 mmol/L — ABNORMAL LOW (ref 22–32)
Calcium: 9.5 mg/dL (ref 8.9–10.3)
Creatinine, Ser: 11.18 mg/dL — ABNORMAL HIGH (ref 0.61–1.24)
GFR calc Af Amer: 5 mL/min — ABNORMAL LOW (ref >60)
GFR, EST NON AFRICAN AMERICAN: 4 mL/min — AB (ref >60)
Glucose, Bld: 75 mg/dL (ref 65–99)
SODIUM: 132 mmol/L — AB (ref 135–145)

## 2015-09-17 LAB — GLUCOSE, RANDOM: GLUCOSE: 76 mg/dL (ref 65–99)

## 2015-09-17 LAB — GLUCOSE, CAPILLARY
GLUCOSE-CAPILLARY: 65 mg/dL (ref 65–99)
Glucose-Capillary: 63 mg/dL — ABNORMAL LOW (ref 65–99)

## 2015-09-17 LAB — CBG MONITORING, ED: Glucose-Capillary: 70 mg/dL (ref 65–99)

## 2015-09-17 MED ORDER — GABAPENTIN 100 MG PO CAPS
100.0000 mg | ORAL_CAPSULE | Freq: Two times a day (BID) | ORAL | Status: DC
  Administered 2015-09-17 – 2015-09-18 (×3): 100 mg via ORAL
  Filled 2015-09-17 (×4): qty 1

## 2015-09-17 MED ORDER — DEXTROSE 10 % IV SOLN
Freq: Once | INTRAVENOUS | Status: AC
  Administered 2015-09-17: 14:00:00 via INTRAVENOUS

## 2015-09-17 MED ORDER — ATORVASTATIN CALCIUM 40 MG PO TABS
40.0000 mg | ORAL_TABLET | Freq: Every day | ORAL | Status: DC
  Administered 2015-09-17 – 2015-09-18 (×2): 40 mg via ORAL
  Filled 2015-09-17 (×2): qty 1

## 2015-09-17 MED ORDER — ASPIRIN EC 81 MG PO TBEC
81.0000 mg | DELAYED_RELEASE_TABLET | Freq: Every day | ORAL | Status: DC
  Administered 2015-09-17 – 2015-09-18 (×2): 81 mg via ORAL
  Filled 2015-09-17 (×2): qty 1

## 2015-09-17 MED ORDER — LEVETIRACETAM 500 MG PO TABS
1000.0000 mg | ORAL_TABLET | Freq: Every day | ORAL | Status: DC
  Administered 2015-09-17 – 2015-09-18 (×2): 1000 mg via ORAL
  Filled 2015-09-17 (×2): qty 2

## 2015-09-17 MED ORDER — CALCIUM ACETATE (PHOS BINDER) 667 MG PO CAPS
2001.0000 mg | ORAL_CAPSULE | Freq: Three times a day (TID) | ORAL | Status: DC
  Administered 2015-09-18 (×3): 2001 mg via ORAL
  Filled 2015-09-17 (×4): qty 3

## 2015-09-17 MED ORDER — SODIUM CHLORIDE 0.9% FLUSH
3.0000 mL | Freq: Two times a day (BID) | INTRAVENOUS | Status: DC
  Administered 2015-09-17: 3 mL via INTRAVENOUS

## 2015-09-17 MED ORDER — LOSARTAN POTASSIUM 50 MG PO TABS
100.0000 mg | ORAL_TABLET | Freq: Every evening | ORAL | Status: DC
  Administered 2015-09-17 – 2015-09-18 (×2): 100 mg via ORAL
  Filled 2015-09-17 (×2): qty 2

## 2015-09-17 MED ORDER — CALCIUM GLUCONATE 10 % IV SOLN
1.0000 g | Freq: Once | INTRAVENOUS | Status: AC
  Administered 2015-09-17: 1 g via INTRAVENOUS
  Filled 2015-09-17: qty 10

## 2015-09-17 MED ORDER — DEXTROSE 50 % IV SOLN
INTRAVENOUS | Status: AC
  Administered 2015-09-17: 50 mL
  Filled 2015-09-17: qty 50

## 2015-09-17 MED ORDER — MELATONIN 3 MG PO TABS
3.0000 mg | ORAL_TABLET | Freq: Every day | ORAL | Status: DC
  Administered 2015-09-17 – 2015-09-18 (×2): 3 mg via ORAL
  Filled 2015-09-17 (×2): qty 1

## 2015-09-17 MED ORDER — DARBEPOETIN ALFA 200 MCG/0.4ML IJ SOSY
200.0000 ug | PREFILLED_SYRINGE | Freq: Once | INTRAMUSCULAR | Status: AC
  Administered 2015-09-17: 200 ug via INTRAVENOUS

## 2015-09-17 MED ORDER — SENNOSIDES-DOCUSATE SODIUM 8.6-50 MG PO TABS
1.0000 | ORAL_TABLET | Freq: Two times a day (BID) | ORAL | Status: DC
  Administered 2015-09-17 – 2015-09-18 (×2): 1 via ORAL
  Filled 2015-09-17 (×3): qty 1

## 2015-09-17 MED ORDER — INSULIN ASPART 100 UNIT/ML IV SOLN
10.0000 [IU] | Freq: Once | INTRAVENOUS | Status: AC
  Administered 2015-09-17: 10 [IU] via INTRAVENOUS
  Filled 2015-09-17: qty 1

## 2015-09-17 MED ORDER — PROMETHAZINE HCL 25 MG PO TABS: 12.5000 mg | ORAL_TABLET | Freq: Four times a day (QID) | ORAL | Status: DC | PRN

## 2015-09-17 MED ORDER — HEPARIN SODIUM (PORCINE) 5000 UNIT/ML IJ SOLN
5000.0000 [IU] | Freq: Three times a day (TID) | INTRAMUSCULAR | Status: DC
  Administered 2015-09-17: 5000 [IU] via SUBCUTANEOUS
  Filled 2015-09-17 (×2): qty 1

## 2015-09-17 MED ORDER — POLYETHYLENE GLYCOL 3350 17 G PO PACK
17.0000 g | PACK | Freq: Every day | ORAL | Status: DC
  Administered 2015-09-17: 17 g via ORAL
  Filled 2015-09-17 (×2): qty 1

## 2015-09-17 MED ORDER — DARBEPOETIN ALFA 200 MCG/0.4ML IJ SOSY
PREFILLED_SYRINGE | INTRAMUSCULAR | Status: AC
Start: 2015-09-17 — End: 2015-09-18
  Filled 2015-09-17: qty 0.4

## 2015-09-17 MED ORDER — HYDROCODONE-ACETAMINOPHEN 5-325 MG PO TABS
1.0000 | ORAL_TABLET | Freq: Four times a day (QID) | ORAL | Status: DC | PRN
  Administered 2015-09-18 (×2): 1 via ORAL
  Filled 2015-09-17 (×3): qty 1

## 2015-09-17 MED ORDER — VERAPAMIL HCL 120 MG PO TABS
120.0000 mg | ORAL_TABLET | Freq: Three times a day (TID) | ORAL | Status: DC
  Administered 2015-09-18: 120 mg via ORAL
  Filled 2015-09-17 (×5): qty 1

## 2015-09-17 MED ORDER — NICOTINE 14 MG/24HR TD PT24
14.0000 mg | MEDICATED_PATCH | Freq: Every day | TRANSDERMAL | Status: DC
  Filled 2015-09-17 (×2): qty 1

## 2015-09-17 MED ORDER — HYDRALAZINE HCL 50 MG PO TABS
50.0000 mg | ORAL_TABLET | Freq: Three times a day (TID) | ORAL | Status: DC
  Administered 2015-09-17 – 2015-09-18 (×3): 50 mg via ORAL
  Filled 2015-09-17 (×3): qty 1

## 2015-09-17 NOTE — ED Provider Notes (Signed)
CSN: [id number]     Arrival date & time 09/17/15  1254 History   First MD Initiated Contact with Patient 09/17/15 1311     Chief Complaint  Patient presents with  . Abnormal Lab     (Consider location/radiation/quality/duration/timing/severity/associated sxs/prior Treatment) HPI  60 year old male with a history of ESRD on dialysis presents with hyperkalemia and anemia. Some from his facility. Lab work there shows potassium of 7.7 and hemoglobin of 7.9 from yesterday. Patient normally is dialysis on Tuesday, Thursday, and Saturday. Today is Wednesday and he states he last got dialysis on Saturday. Patient states she's been feeling weak and had some vomiting over the last couple days. He states because he was weak he did not go to dialysis. He otherwise does not feel ill including no shortness of breath or chest pain.  Past Medical History  Diagnosis Date  . Hypertension   . Anemia, chronic disease     a. Capsule endoscopy reportedly negative 08/2013. b. seen by heme with concern for minor B cell population on BMB, being observed.  . Chronic diastolic CHF (congestive heart failure) ([place])     a. EF 60 - 65% per [place] echo 11/2011. b. Echo 02/2012: severe LVH, focal basal hypertrophy, EF 60-65%, mild Mr.  . Cocaine abuse     mentioned in notes from [place]  . Hepatitis C antibody test positive     was HIV negative, 02/28/12  . Hepatitis B core antibody positive     03/01/10  . Positive QuantiFERON-TB Gold test     11/2011  . Helicobacter pylori gastritis     not defined if this was treated  . Polyp of colon, adenomatous     May 2012.  Dr Trenton Founds in [place]  . Hematochezia   . Head injury, closed, with concussion ([place])   . History of blood transfusion   . Arthritis     "right shoulder" (11/09/2012)  . Seizure disorder [facility])     questionable history of - will need to clarify with PCP  . Headache(784.0)   . Hypercalcemia   . Hyperpotassemia   . Tobacco abuse   .  Protein-calorie malnutrition, severe ([place])   . Optic neuritis, left   . Hypertensive heart disease   . GI bleed 05/04/2012  . ESRD on hemodialysis ([facility]) since 2012    a. Since 2012. ESRD was due to "drugs", primarily used cocaine.  Has 3-5 year hx of HTN, no DM.  Gets HD on TTS schedule at [place]. Originally from [place].   Marland Kitchen ESRD (end stage renal disease) on dialysis [facility])     "TTS; [address]" (06/19/2015)   Past Surgical History  Procedure Laterality Date  . Shoulder open rotator cuff repair Right   . Total knee arthroplasty Left   . Bascilic vein transposition  03/07/2012    Procedure: BASCILIC VEIN TRANSPOSITION;  Surgeon: Conrad [place], MD;  Location: [place];  Service: Vascular;  Laterality: Left;  First Stage  . Insertion of dialysis catheter      right chest  . Bascilic vein transposition Left 05/31/2012    Procedure: BASCILIC VEIN TRANSPOSITION;  Surgeon: Conrad , MD;  Location: [place];  Service: Vascular;  Laterality: Left;  Left 2nd Stage Basilic Vein Transposition with gortex graft revision using 43mmx10cm graft  . Shoulder open rotator cuff repair Right   . [name] capsule study N/A 08/27/2013    Procedure: [name] CAPSULE STUDY;  Surgeon: Milus Banister, MD;  Location:  [place] [facility];  Service: Endoscopy;  Laterality: N/A;  . Joint replacement     Family History  Problem Relation Age of Onset  . Diabetes Mother   . Hypertension Mother   . Stroke Mother   . Kidney failure Mother   . Cancer Father    Social History  Substance Use Topics  . Smoking status: Current Every Day Smoker -- 1.50 packs/day for 45 years    Types: Cigarettes  . Smokeless tobacco: Never Used  . Alcohol Use: 14.4 oz/week    24 Cans of beer per week     Comment: 1 40's per day    Review of Systems  Constitutional: Negative for fever.  Respiratory: Negative for shortness of breath.   Cardiovascular: Negative for chest pain.  Gastrointestinal: Positive for vomiting. Negative for nausea.   Neurological: Positive for weakness.  All other systems reviewed and are negative.     Allergies  Reglan  Home Medications   Prior to Admission medications   Medication Sig Start Date End Date Taking? Authorizing Provider  aspirin EC 81 MG EC tablet Take 1 tablet (81 mg total) by mouth daily. 08/20/15  Yes Ripudeep Krystal Eaton, MD  atorvastatin (LIPITOR) 40 MG tablet Take 1 tablet (40 mg total) by mouth at bedtime. 08/20/15  Yes Ripudeep Krystal Eaton, MD  calcium acetate (PHOSLO) 667 MG capsule Take 2,001 mg by mouth 3 (three) times daily with meals.    Yes Historical Provider, MD  gabapentin (NEURONTIN) 100 MG capsule Take 100 mg by mouth every 12 (twelve) hours.   Yes Historical Provider, MD  hydrALAZINE (APRESOLINE) 50 MG tablet Take 1 tablet (50 mg total) by mouth 3 (three) times daily. 08/21/15  Yes Ripudeep Krystal Eaton, MD  HYDROcodone-acetaminophen (NORCO/VICODIN) 5-325 MG tablet Take 1 tablet by mouth every 6 (six) hours as needed for moderate pain.   Yes Historical Provider, MD  levETIRAcetam (KEPPRA) 1000 MG tablet Take 1 tablet (1,000 mg total) by mouth daily. 04/25/14  Yes Luan Moore, MD  losartan (COZAAR) 100 MG tablet Take 100 mg by mouth every evening. 10/02/14  Yes Historical Provider, MD  Melatonin 3 MG TABS Take 3 mg by mouth at bedtime.   Yes Historical Provider, MD  metoprolol (LOPRESSOR) 50 MG tablet Take 50 mg by mouth once.   Yes Historical Provider, MD  ondansetron (ZOFRAN-ODT) 4 MG disintegrating tablet Take 4 mg by mouth every 8 (eight) hours as needed for nausea or vomiting.   Yes Historical Provider, MD  polyethylene glycol (MIRALAX / GLYCOLAX) packet Take 17 g by mouth daily. 08/26/15  Yes Bonnell Public, MD  senna-docusate (SENOKOT-S) 8.6-50 MG tablet Take 1 tablet by mouth 2 (two) times daily. For constipation 08/21/15  Yes Ripudeep K Rai, MD  sodium polystyrene (KAYEXALATE) 15 GM/60ML suspension Take 15 g by mouth once.   Yes Historical Provider, MD  verapamil (CALAN) 120 MG tablet  Take 1 tablet (120 mg total) by mouth every 8 (eight) hours. 08/20/15  Yes Ripudeep Krystal Eaton, MD  polyethylene glycol (MIRALAX / GLYCOLAX) packet Take 17 g by mouth daily as needed for moderate constipation. Patient not taking: Reported on 09/17/2015 08/21/15   Ripudeep Krystal Eaton, MD   SpO2 93% Physical Exam  Constitutional: [name] oriented to person, place, and time. He appears well-developed and well-nourished. No distress.  HENT:  Head: Normocephalic and atraumatic.  Right Ear: External ear normal.  Left Ear: External ear normal.  Nose: Nose normal.  Eyes: Right eye exhibits no  discharge. Left eye exhibits no discharge.  Neck: Neck supple.  Cardiovascular: Normal rate, regular rhythm, normal heart sounds and intact distal pulses.   Pulmonary/Chest: Effort normal. He has rales (bibasilar).  Abdominal: Soft. There is no tenderness.  Musculoskeletal: He exhibits no edema.  Left AV graft with positive thrill  Neurological: [name] alert and oriented to person, place, and time.  Skin: Skin is warm and dry. [name] not diaphoretic.  Nursing note and vitals reviewed.   ED Course  Procedures (including critical care time) Labs Review Labs Reviewed  BASIC METABOLIC PANEL - Abnormal; Notable for the following:    Sodium 132 (*)    Potassium >7.5 (*)    Chloride 95 (*)    CO2 16 (*)    BUN 163 (*)    Creatinine, Ser 11.18 (*)    GFR calc non Af Amer 4 (*)    GFR calc Af Amer 5 (*)    Anion gap 21 (*)    All other components within normal limits  GLUCOSE, RANDOM  CBC WITH DIFFERENTIAL/PLATELET  CBC  CREATININE, SERUM  COMPREHENSIVE METABOLIC PANEL  CBC  URINE RAPID DRUG SCREEN, HOSP PERFORMED  PREALBUMIN  I-STAT CHEM 8, ED  CBG MONITORING, ED    Imaging Review No results found. I have personally reviewed and evaluated these images and lab results as part of my medical decision-making.   EKG Interpretation   Date/Time:  Wednesday Sep 17 2015 13:05:27 EDT Ventricular Rate:  59 PR  Interval:  258 QRS Duration: 132 QT Interval:  476 QTC Calculation: 472 R Axis:   78 Text Interpretation:  Sinus rhythm Prolonged PR interval Probable left  atrial enlargement Left ventricular hypertrophy Abnormal T, consider  ischemia, lateral leads Tall T, consider metabolic/ischemic abnrm T waves  are peaked, new since May 1 Confirmed by Viggo Perko MD, Brown Dunlap [phone]) on  09/17/2015 1:10:13 PM Also confirmed by Regenia Skeeter MD, Peter Keyworth [phone]), editor  Gilford Rile, CCT, [place] ([id number])  on 09/17/2015 1:40:26 PM      CRITICAL CARE Performed by: Sherwood Gambler T   Total critical care time: 35 minutes  Critical care time was exclusive of separately billable procedures and treating other patients.  Critical care was necessary to treat or prevent imminent or life-threatening deterioration.  Critical care was time spent personally by me on the following activities: development of treatment plan with patient and/or surrogate as well as nursing, discussions with consultants, evaluation of patient's response to treatment, examination of patient, obtaining history from patient or surrogate, ordering and performing treatments and interventions, ordering and review of laboratory studies, ordering and review of radiographic studies, pulse oximetry and re-evaluation of patient's condition.  MDM   Final diagnoses:  Acute hyperkalemia  ESRD on dialysis ([place])  Anemia, unspecified anemia type    Patient is currently asymptomatic but his potassium is quite elevated, over 8.5. Very peaked T waves but no QRS widening from baseline. IV placed by me. Patient is hypertensive but no distress. Will give IV calcium, insulin and glucose. D/w Dr. Jonnie Finner, he will dialyze emergently. Dr. Marily Memos to admit but he is going to dialysis first.     Sherwood Gambler, MD 09/17/15 [phone]

## 2015-09-17 NOTE — Progress Notes (Signed)
Patient refused MRSA screening test. A.Davi Kroon, RN

## 2015-09-17 NOTE — ED Notes (Signed)
CBG-[id number] 

## 2015-09-17 NOTE — Progress Notes (Signed)
[id number]. CBG = 63. TRH notified. RN will continue to monitor. A.Jakolby Sedivy, RN

## 2015-09-17 NOTE — Consult Note (Signed)
Renal Service Consult Note [facility]  [name] 09/17/2015 [name] Requesting Physician:  Dr. Regenia Skeeter  Reason for Consult:  ESRD pt with hyperkalemia, missed HD HPI: The patient is a 59 y.o. year-old with hx of cocaine abuse, seizure d/o, HTN and ESRD on HD at [facility] TTS.  He presented today feeling "weak", missed last HD on Sat 3 days ago.  K here is very high > 7.5, EKG shows peaked T waves , mild IVCD. Asked to see for HD.    Patient reports begin very "weak" yesterday and also N/V.  N/V better now.  No abd pain, fever, chills or sweats.  No CP or SOB.    Patient on HD at [place].  On HD 5 yrs. +HTN, no DM, no MI / stents.  Grew up in [place], family (brother, daughter) still live there. Not sure how long in [place].  Problems w homelesness.  Was at [facility], then found a place with "some girls from [place]" to live with , but that didn't work out.  The asked [employer] to help him find a place and they found him an apt in [name] where he is living now.  Takes transport to HD.  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  On disability now w Medicaid.        Chart review: Mar '15 - Hb 5 anemia, recent BMbx ?lymphoma  Mar 15 - Hb 6 anemia May 15 - Hb 6 > 7.6 w prbc's Oct 15 - L optic perineuritis, rx w steroids Oct 15 - HTN urgency, cocaine Oct 15 - chest pain Dec 15 - anemia Jan 16 - HCAP Dec 16 - Melena, Hb 3 Mar 17  - AMS/ etoh withdrawal, missed HD Apr 17 -  NSTEMI/ CP due to cocaine abuse    ROS  denies CP  no joint pain   no HA  no blurry vision  no rash  no difficulty voiding  no change in urine color    Past Medical History  Past Medical History  Diagnosis Date  . Hypertension   . Anemia, chronic disease     a. Capsule endoscopy reportedly negative 08/2013. b. seen by heme with concern for minor B cell population on BMB, being observed.  . Chronic diastolic CHF (congestive heart failure) ([place])     a. EF  60 - 65% per [place] echo 11/2011. b. Echo 02/2012: severe LVH, focal basal hypertrophy, EF 60-65%, mild Mr.  . Cocaine abuse     mentioned in notes from [place]  . Hepatitis C antibody test positive     was HIV negative, 02/28/12  . Hepatitis B core antibody positive     03/01/10  . Positive QuantiFERON-TB Gold test     11/2011  . Helicobacter pylori gastritis     not defined if this was treated  . Polyp of colon, adenomatous     May 2012.  Dr Trenton Founds in [place]  . Hematochezia   . Head injury, closed, with concussion ([place])   . History of blood transfusion   . Arthritis     "right shoulder" (11/09/2012)  . Seizure disorder [facility])     questionable history of - will need to clarify with PCP  . Headache(784.0)   . Hypercalcemia   . Hyperpotassemia   . Tobacco abuse   . Protein-calorie malnutrition, severe ([place])   . Optic neuritis, left   . Hypertensive heart disease   .  GI bleed 05/04/2012  . ESRD on hemodialysis ([place]) since 2012    a. Since 2012. ESRD was due to "drugs", primarily used cocaine.  Has 3-5 year hx of HTN, no DM.  Gets HD on TTS schedule at [place]. Originally from [place].   Marland Kitchen ESRD (end stage renal disease) on dialysis [facility])     "TTS; [address]" (06/19/2015)   Past Surgical History  Past Surgical History  Procedure Laterality Date  . Shoulder open rotator cuff repair Right   . Total knee arthroplasty Left   . Bascilic vein transposition  03/07/2012    Procedure: BASCILIC VEIN TRANSPOSITION;  Surgeon: Conrad [place], MD;  Location: [place];  Service: Vascular;  Laterality: Left;  First Stage  . Insertion of dialysis catheter      right chest  . Bascilic vein transposition Left 05/31/2012    Procedure: BASCILIC VEIN TRANSPOSITION;  Surgeon: Conrad [place], MD;  Location: [place];  Service: Vascular;  Laterality: Left;  Left 2nd Stage Basilic Vein Transposition with gortex graft revision using 100mmx10cm graft  . Shoulder open rotator cuff repair Right   .  [name] capsule study N/A 08/27/2013    Procedure: [name] CAPSULE STUDY;  Surgeon: Milus Banister, MD;  Location: [place];  Service: Endoscopy;  Laterality: N/A;  . Joint replacement     Family History  Family History  Problem Relation Age of Onset  . Diabetes Mother   . Hypertension Mother   . Stroke Mother   . Kidney failure Mother   . Cancer Father    Social History  reports that he has been smoking Cigarettes.  He has a 67.5 pack-year smoking history. He has never used smokeless tobacco. He reports that he drinks about 14.4 oz of alcohol per week. He reports that he uses illicit drugs ("Crack" cocaine, Cocaine, and Marijuana). Allergies  Allergies  Allergen Reactions  . Reglan [Metoclopramide] Other (See Comments)    Tardive dyskinesia in 11/2011 in [facility] medications Prior to Admission medications   Medication Sig Start Date End Date Taking? Authorizing Provider  aspirin EC 81 MG EC tablet Take 1 tablet (81 mg total) by mouth daily. 08/20/15  Yes Ripudeep Krystal Eaton, MD  atorvastatin (LIPITOR) 40 MG tablet Take 1 tablet (40 mg total) by mouth at bedtime. 08/20/15  Yes Ripudeep Krystal Eaton, MD  calcium acetate (PHOSLO) 667 MG capsule Take 2,001 mg by mouth 3 (three) times daily with meals.    Yes Historical Provider, MD  gabapentin (NEURONTIN) 100 MG capsule Take 100 mg by mouth every 12 (twelve) hours.   Yes Historical Provider, MD  hydrALAZINE (APRESOLINE) 50 MG tablet Take 1 tablet (50 mg total) by mouth 3 (three) times daily. 08/21/15  Yes Ripudeep Krystal Eaton, MD  HYDROcodone-acetaminophen (NORCO/VICODIN) 5-325 MG tablet Take 1 tablet by mouth every 6 (six) hours as needed for moderate pain.   Yes Historical Provider, MD  levETIRAcetam (KEPPRA) 1000 MG tablet Take 1 tablet (1,000 mg total) by mouth daily. 04/25/14  Yes Luan Moore, MD  losartan (COZAAR) 100 MG tablet Take 100 mg by mouth every evening. 10/02/14  Yes Historical Provider, MD  Melatonin 3 MG TABS Take 3 mg by mouth at  bedtime.   Yes Historical Provider, MD  metoprolol (LOPRESSOR) 50 MG tablet Take 50 mg by mouth once.   Yes Historical Provider, MD  ondansetron (ZOFRAN-ODT) 4 MG disintegrating tablet Take 4 mg by mouth every 8 (eight) hours as needed for  nausea or vomiting.   Yes Historical Provider, MD  polyethylene glycol (MIRALAX / GLYCOLAX) packet Take 17 g by mouth daily. 08/26/15  Yes Bonnell Public, MD  senna-docusate (SENOKOT-S) 8.6-50 MG tablet Take 1 tablet by mouth 2 (two) times daily. For constipation 08/21/15  Yes Ripudeep K Rai, MD  sodium polystyrene (KAYEXALATE) 15 GM/60ML suspension Take 15 g by mouth once.   Yes Historical Provider, MD  verapamil (CALAN) 120 MG tablet Take 1 tablet (120 mg total) by mouth every 8 (eight) hours. 08/20/15  Yes Ripudeep Krystal Eaton, MD  polyethylene glycol (MIRALAX / GLYCOLAX) packet Take 17 g by mouth daily as needed for moderate constipation. Patient not taking: Reported on 09/17/2015 08/21/15   Ripudeep Krystal Eaton, MD   Liver Function Tests No results for input(s): AST, ALT, ALKPHOS, BILITOT, PROT, ALBUMIN in the last 168 hours. No results for input(s): LIPASE, AMYLASE in the last 168 hours. CBC No results for input(s): WBC, NEUTROABS, HGB, HCT, MCV, PLT in the last 168 hours. Basic Metabolic Panel No results for input(s): NA, K, CL, CO2, GLUCOSE, BUN, CREATININE, CALCIUM, PHOS in the last 168 hours.  Filed Vitals:   09/17/15 1257  SpO2: 93%   Exam Awake, fatigues, no distress No rash, cyanosis or gangrene Sclera anicteric, throat clear  No jvd or bruits Chest clear bilat RRR 2/6 SEM , PMI displaced and sustained, no S3 Abd soft ntnd no mass or ascites +bs  GU normal male MS no joint effusions or deformity Ext no sig LE or UE edema Neuro is alert, Ox 3 , nf LUA AVF +bruit    Dialysis:  TTS AF 4h    3K/2.0 bath   LUA AVF  Hep none  Assessment: 1   Gen weaknees / severe hyperkalemia - for acute HD asap 2   ESRD missed HD yest 3   HTN on verap/ MTP/  losartan/ hydral 4   Hx drug abuse 5   Hx anemia, recurrent - CBC pending 6   Seizure on keppra   Plan - HD asap, get K down  Kelly Splinter MD [facility] pager [phone]    cell [phone] 09/17/2015, 2:02 PM

## 2015-09-17 NOTE — ED Notes (Signed)
Pt.BIB [facility] for evaluation of hyperkalemia and low hemoglobin. Pt. Missed dialysis yesterday. Pt. Lives at [facility], pt. Is a DNR. Pt. Is lethargic but AxO x4.

## 2015-09-17 NOTE — H&P (Signed)
History and Physical    [name] [id number] DOB: [birth year] DOA: 09/17/2015  PCP: No primary care provider on file. Patient coming from: NH  Chief Complaint: Generalized weakness and missed dialysis.  HPI: [name] is a 60 y.o. male with medical history significant of HTN, CHF, anemia, prior substance abuse, hepatitis C, hepatitis B, tuberculosis, seizures, headaches, ESRD, HTN presenting from rehabilitation/nursing home facility due to generalized weakness, elevated potassium, anemia, and nausea and vomiting.  Patient saw reticent to provide past medical history information at this time. Patient does not appear to be in a very good mood is not very conversive. Wanting to be left alone so he can rest. Patient is alert and oriented 3. Per report and from what basic history I can ascertain from the patient lab work at the nursing home showed potassium 7.7 and hemoglobin of 7.9. [facility] after those results came back today. Patient typically gets dialysis Tuesday Thursday and Saturday. Patient missed his dialysis session yesterday, Tuesday, due to not feeling well enough to go to dialysis. Patient states that yesterday he did feel nauseous and vomited 1 that was nonbloody nonbilious. No further nausea or vomiting since then. Denies any palpitations, chest pain, shortness of breath, headache, abdominal pain, dysuria, frequency, diarrhea, back pain, rash.  Denies any recent drug use.  ED Course: Patient's lab values were again noted with repeat labs today in the ED. Patient received dextrose, insulin in the ED. No cardiac arrhythmias appreciated. Nephrology was consult for emergent dialysis.  Review of Systems: As per HPI otherwise 10 point review of systems negative.   Ambulatory Status: minimally ambulatory. PT came from cardiac rehab facility  Past Medical History  Diagnosis Date  . Hypertension   . Anemia, chronic disease     a. Capsule endoscopy  reportedly negative 08/2013. b. seen by heme with concern for minor B cell population on BMB, being observed.  . Chronic diastolic CHF (congestive heart failure) ([place])     a. EF 60 - 65% per [place] echo 11/2011. b. Echo 02/2012: severe LVH, focal basal hypertrophy, EF 60-65%, mild Mr.  . Cocaine abuse     mentioned in notes from [facility]  . Hepatitis C antibody test positive     was HIV negative, 02/28/12  . Hepatitis B core antibody positive     03/01/10  . Positive QuantiFERON-TB Gold test     11/2011  . Helicobacter pylori gastritis     not defined if this was treated  . Polyp of colon, adenomatous     May 2012.  Dr Trenton Founds in [place]  . Hematochezia   . Head injury, closed, with concussion ([place])   . History of blood transfusion   . Arthritis     "right shoulder" (11/09/2012)  . Seizure disorder [facility])     questionable history of - will need to clarify with PCP  . Headache(784.0)   . Hypercalcemia   . Hyperpotassemia   . Tobacco abuse   . Protein-calorie malnutrition, severe ([place])   . Optic neuritis, left   . Hypertensive heart disease   . GI bleed 05/04/2012  . ESRD on hemodialysis ([place]) since 2012    a. Since 2012. ESRD was due to "drugs", primarily used cocaine.  Has 3-5 year hx of HTN, no DM.  Gets HD on TTS schedule at [facility]. Originally from [place].   Marland Kitchen ESRD (end stage renal disease) on dialysis ([place])     "TTS; [address]" (06/19/2015)    Past Surgical History  Procedure Laterality Date  . Shoulder open rotator cuff repair Right   . Total knee arthroplasty Left   . Bascilic vein transposition  03/07/2012    Procedure: BASCILIC VEIN TRANSPOSITION;  Surgeon: Conrad Hayden, MD;  Location: [facility];  Service: Vascular;  Laterality: Left;  First Stage  . Insertion of dialysis catheter      right chest  . Bascilic vein transposition Left 05/31/2012    Procedure: BASCILIC VEIN TRANSPOSITION;  Surgeon: Conrad [place], MD;  Location: [facility];  Service: Vascular;   Laterality: Left;  Left 2nd Stage Basilic Vein Transposition with gortex graft revision using 97mmx10cm graft  . Shoulder open rotator cuff repair Right   . [name] capsule study N/A 08/27/2013    Procedure: [name] CAPSULE STUDY;  Surgeon: Milus Banister, MD;  Location: [facility];  Service: Endoscopy;  Laterality: N/A;  . Joint replacement       reports that he has been smoking Cigarettes.  He has a 67.5 pack-year smoking history. He has never used smokeless tobacco. He reports that he drinks about 14.4 oz of alcohol per week. He reports that he uses illicit drugs ("Crack" cocaine, Cocaine, and Marijuana).  Allergies  Allergen Reactions  . Reglan [Metoclopramide] Other (See Comments)    Tardive dyskinesia in 11/2011 in [place]    Family History  Problem Relation Age of Onset  . Diabetes Mother   . Hypertension Mother   . Stroke Mother   . Kidney failure Mother   . Cancer Father     Prior to Admission medications   Medication Sig Start Date End Date Taking? Authorizing Provider  aspirin EC 81 MG EC tablet Take 1 tablet (81 mg total) by mouth daily. 08/20/15  Yes Ripudeep Krystal Eaton, MD  atorvastatin (LIPITOR) 40 MG tablet Take 1 tablet (40 mg total) by mouth at bedtime. 08/20/15  Yes Ripudeep Krystal Eaton, MD  calcium acetate (PHOSLO) 667 MG capsule Take 2,001 mg by mouth 3 (three) times daily with meals.    Yes Historical Provider, MD  gabapentin (NEURONTIN) 100 MG capsule Take 100 mg by mouth every 12 (twelve) hours.   Yes Historical Provider, MD  hydrALAZINE (APRESOLINE) 50 MG tablet Take 1 tablet (50 mg total) by mouth 3 (three) times daily. 08/21/15  Yes Ripudeep Krystal Eaton, MD  HYDROcodone-acetaminophen (NORCO/VICODIN) 5-325 MG tablet Take 1 tablet by mouth every 6 (six) hours as needed for moderate pain.   Yes Historical Provider, MD  levETIRAcetam (KEPPRA) 1000 MG tablet Take 1 tablet (1,000 mg total) by mouth daily. 04/25/14  Yes Luan Moore, MD  losartan (COZAAR) 100 MG tablet Take 100 mg by mouth  every evening. 10/02/14  Yes Historical Provider, MD  Melatonin 3 MG TABS Take 3 mg by mouth at bedtime.   Yes Historical Provider, MD  metoprolol (LOPRESSOR) 50 MG tablet Take 50 mg by mouth once.   Yes Historical Provider, MD  ondansetron (ZOFRAN-ODT) 4 MG disintegrating tablet Take 4 mg by mouth every 8 (eight) hours as needed for nausea or vomiting.   Yes Historical Provider, MD  polyethylene glycol (MIRALAX / GLYCOLAX) packet Take 17 g by mouth daily. 08/26/15  Yes Bonnell Public, MD  senna-docusate (SENOKOT-S) 8.6-50 MG tablet Take 1 tablet by mouth 2 (two) times daily. For constipation 08/21/15  Yes Ripudeep K Rai, MD  sodium polystyrene (KAYEXALATE) 15 GM/60ML suspension Take 15 g by mouth once.   Yes Historical Provider, MD  verapamil (CALAN) 120 MG tablet Take 1 tablet (120 mg total) by mouth every 8 (eight) hours. 08/20/15  Yes Ripudeep Krystal Eaton, MD  polyethylene glycol (MIRALAX / GLYCOLAX) packet Take 17 g by mouth daily as needed for moderate constipation. Patient not taking: Reported on 09/17/2015 08/21/15   Ripudeep Krystal Eaton, MD    Physical Exam: Filed Vitals:   09/17/15 1530 09/17/15 1600 09/17/15 1630 09/17/15 1700  BP: 180/83 169/77 155/74 159/74  Pulse: 78 78 78 79  Temp:      TempSrc:      Resp:      SpO2:          Constitutional: NAD, calm, comfortable Eyes:  PERRL, lids and conjunctivae normal ENMT:  Mucous membranes are moist. Poor dentition Neck:  normal, supple, no masses, no thyromegaly Respiratory:  clear to auscultation bilaterally, no wheezing, no crackles. Normal respiratory effort. No accessory muscle use.  Cardiovascular: [id number] systolic murmur, regular rate and rhythm, carotid bruits present on both side though likely transmitted from fistula, left AV fistula patent. Abdomen:  no tenderness, no masses palpated. No hepatosplenomegaly. Bowel sounds positive.  Musculoskeletal:  no clubbing / cyanosis. No joint deformity upper and lower extremities. Good ROM, no  contractures. Normal muscle tone.  Skin:  no rashes, lesions, ulcers. No induration Neurologic:  CN 2-12 grossly intact. Sensation intact, Strength 5/5 in all 4.  Psychiatric:  Normal judgment and insight. Alert and oriented x 3. Normal mood.    Labs on Admission: I have personally reviewed following labs and imaging studies  CBC: No results for input(s): WBC, NEUTROABS, HGB, HCT, MCV, PLT in the last 168 hours. Basic Metabolic Panel:  Recent Labs Lab 09/17/15 1306 09/17/15 1326  NA 132*  --   K >7.5*  --   CL 95*  --   CO2 16*  --   GLUCOSE 75 76  BUN 163*  --   CREATININE 11.18*  --   CALCIUM 9.5  --    GFR: CrCl cannot be calculated (Unknown ideal weight.). Liver Function Tests: No results for input(s): AST, ALT, ALKPHOS, BILITOT, PROT, ALBUMIN in the last 168 hours. No results for input(s): LIPASE, AMYLASE in the last 168 hours. No results for input(s): AMMONIA in the last 168 hours. Coagulation Profile: No results for input(s): INR, PROTIME in the last 168 hours. Cardiac Enzymes: No results for input(s): CKTOTAL, CKMB, CKMBINDEX, TROPONINI in the last 168 hours. BNP (last 3 results) No results for input(s): PROBNP in the last 8760 hours. HbA1C: No results for input(s): HGBA1C in the last 72 hours. CBG:  Recent Labs Lab 09/17/15 1401  GLUCAP 70   Lipid Profile: No results for input(s): CHOL, HDL, LDLCALC, TRIG, CHOLHDL, LDLDIRECT in the last 72 hours. Thyroid Function Tests: No results for input(s): TSH, T4TOTAL, FREET4, T3FREE, THYROIDAB in the last 72 hours. Anemia Panel: No results for input(s): VITAMINB12, FOLATE, FERRITIN, TIBC, IRON, RETICCTPCT in the last 72 hours. Urine analysis:    Component Value Date/Time   COLORURINE YELLOW 11/07/2012 1903   APPEARANCEUR CLEAR 11/07/2012 1903   LABSPEC 1.012 11/07/2012 1903   PHURINE 8.5* 11/07/2012 1903   GLUCOSEU 250* 11/07/2012 1903   HGBUR NEGATIVE 11/07/2012 1903   BILIRUBINUR NEGATIVE 11/07/2012 1903     KETONESUR NEGATIVE 11/07/2012 1903   PROTEINUR >300* 11/07/2012 1903   UROBILINOGEN 0.2 11/07/2012 1903   NITRITE NEGATIVE 11/07/2012 1903   LEUKOCYTESUR NEGATIVE 11/07/2012 1903    Creatinine Clearance: CrCl cannot be calculated (Unknown ideal weight.).  Sepsis Labs: @LABRCNTIP (procalcitonin:4,lacticidven:4) )No results found for this or any previous visit (from the past 240 hour(s)).   Radiological Exams on Admission: No results found.  EKG: Independently reviewed.   Assessment/Plan Active Problems:   ESRD on hemodialysis (HCC)   Seizure disorder (HCC)   Tobacco abuse   Protein-calorie malnutrition, severe (HCC)   Hyperkalemia   Physical deconditioning   Anemia in chronic kidney disease   Polysubstance abuse   HLD (hyperlipidemia)   Hyperkalemia: Likely from ongoing ESRD and missed dialysis session. EKG with peaked T waves. Nephrology consult by EDP. Ca/gluc and Dextrose and insulin given in ED - Emergent dialysis - Follow-up chemistries after dialysis, BMET 00:00 and QAM  Anemia: likely from chronic disease/ESRD. Baseline 9.6-10. Currently 8.2. No reported melena/hematochezia/BRBPR. Aranesp given in ED - continue Aranesp - FOBT - CBC in am  HTN: - continu hydralazine, losartan, verapamil - DC home Metop given cocaine abuse  Protein calorie malnutrition/physical deconditioning: pt came from rehab facility. (cardiac rehab per pt). Generalized weakness over past several days. Barely ambulatory at this point. Low albumin - Prealbumin - Pt/Ot  Polysubstance abuse:  - nicotine patch - UDS  Seizures: No recent seizures - Continue Keppra  HLD: - continue lipitor   DVT prophylaxis: Hep  Code Status: FULL  Family Communication: none  Disposition Plan: pending improvement after dialysis, suspect transfer back to rehab facility/SNF  Consults called: nephrology  Admission status: obs    Kaylei Frink J MD [facility]  If 7PM-7AM, please contact  night-coverage [email or web address] Password [facility]  09/17/2015, 5:26 PM

## 2015-09-18 DIAGNOSIS — Z720 Tobacco use: Secondary | ICD-10-CM

## 2015-09-18 DIAGNOSIS — F1910 Other psychoactive substance abuse, uncomplicated: Secondary | ICD-10-CM | POA: Diagnosis not present

## 2015-09-18 DIAGNOSIS — D631 Anemia in chronic kidney disease: Secondary | ICD-10-CM

## 2015-09-18 DIAGNOSIS — E875 Hyperkalemia: Secondary | ICD-10-CM

## 2015-09-18 DIAGNOSIS — E43 Unspecified severe protein-calorie malnutrition: Secondary | ICD-10-CM

## 2015-09-18 DIAGNOSIS — N186 End stage renal disease: Secondary | ICD-10-CM | POA: Diagnosis not present

## 2015-09-18 DIAGNOSIS — R531 Weakness: Secondary | ICD-10-CM

## 2015-09-18 DIAGNOSIS — N189 Chronic kidney disease, unspecified: Secondary | ICD-10-CM

## 2015-09-18 DIAGNOSIS — Z992 Dependence on renal dialysis: Secondary | ICD-10-CM

## 2015-09-18 LAB — COMPREHENSIVE METABOLIC PANEL
ALT: 26 U/L (ref 17–63)
ANION GAP: 11 (ref 5–15)
AST: 28 U/L (ref 15–41)
Albumin: 2.7 g/dL — ABNORMAL LOW (ref 3.5–5.0)
Alkaline Phosphatase: 56 U/L (ref 38–126)
BILIRUBIN TOTAL: 0.3 mg/dL (ref 0.3–1.2)
BUN: 35 mg/dL — AB (ref 6–20)
CHLORIDE: 95 mmol/L — AB (ref 101–111)
CO2: 29 mmol/L (ref 22–32)
Calcium: 8.5 mg/dL — ABNORMAL LOW (ref 8.9–10.3)
Creatinine, Ser: 4.76 mg/dL — ABNORMAL HIGH (ref 0.61–1.24)
GFR, EST AFRICAN AMERICAN: 14 mL/min — AB (ref >60)
GFR, EST NON AFRICAN AMERICAN: 12 mL/min — AB (ref >60)
Glucose, Bld: 72 mg/dL (ref 65–99)
POTASSIUM: 4.1 mmol/L (ref 3.5–5.1)
Sodium: 135 mmol/L (ref 135–145)
TOTAL PROTEIN: 6.7 g/dL (ref 6.5–8.1)

## 2015-09-18 LAB — CBC
HEMATOCRIT: 22.7 % — AB (ref 39.0–52.0)
Hemoglobin: 7.2 g/dL — ABNORMAL LOW (ref 13.0–17.0)
MCH: 26.5 pg (ref 26.0–34.0)
MCHC: 31.7 g/dL (ref 30.0–36.0)
MCV: 83.5 fL (ref 78.0–100.0)
Platelets: 146 10*3/uL — ABNORMAL LOW (ref 150–400)
RBC: 2.72 MIL/uL — ABNORMAL LOW (ref 4.22–5.81)
RDW: 18.1 % — AB (ref 11.5–15.5)
WBC: 3.5 10*3/uL — ABNORMAL LOW (ref 4.0–10.5)

## 2015-09-18 LAB — PREALBUMIN: Prealbumin: 29.6 mg/dL (ref 18–38)

## 2015-09-18 LAB — MRSA PCR SCREENING: MRSA BY PCR: NEGATIVE

## 2015-09-18 MED ORDER — CALCITRIOL 0.5 MCG PO CAPS
ORAL_CAPSULE | ORAL | Status: AC
  Filled 2015-09-18: qty 2

## 2015-09-18 MED ORDER — DARBEPOETIN ALFA 60 MCG/0.3ML IJ SOSY
PREFILLED_SYRINGE | INTRAMUSCULAR | Status: AC
  Filled 2015-09-18: qty 0.3

## 2015-09-18 NOTE — Progress Notes (Signed)
09/18/2015 1:10 PM  Patient complaining of being light headed. Vital signs taken. Will inform the attending RN. Attempted to order another hamburger for the patient   [employer], RN-BC, [facility] Phone [phone]

## 2015-09-18 NOTE — Evaluation (Signed)
Physical Therapy Evaluation Patient Details Name: [name] MRN: [id number] DOB: 1955/10/05 Today's Date: 09/18/2015   History of Present Illness  [name] is a 60 y.o. male with medical history significant of HTN, CHF, anemia, prior substance abuse, hepatitis C, hepatitis B, tuberculosis, seizures, headaches, ESRD, HTN presenting from rehabilitation/nursing home facility due to generalized weakness, elevated potassium, anemia, and nausea and vomiting.  Clinical Impression  Pt admitted with above diagnosis. Pt currently with functional limitations due to the deficits listed below (see PT Problem List).  Pt will benefit from skilled PT to increase their independence and safety with mobility to allow discharge to the venue listed below.  Recommend returning to SNF for continued rehab.     Follow Up Recommendations SNF    Equipment Recommendations       Recommendations for Other Services       Precautions / Restrictions Precautions Precautions: Fall Restrictions Weight Bearing Restrictions: No      Mobility  Bed Mobility Overal bed mobility: Modified Independent             General bed mobility comments: Pt came to EOB without A.  he donned shorts in supine and donned socks without A on EOB.  Transfers Overall transfer level: Needs assistance Equipment used: Rolling walker (2 wheeled) Transfers: Sit to/from Stand Sit to Stand: Min guard         General transfer comment: cues for hand placement  Ambulation/Gait Ambulation/Gait assistance: Min guard Ambulation Distance (Feet): 90 Feet Assistive device: Rolling walker (2 wheeled) Gait Pattern/deviations: Decreased step length - right;Decreased step length - left;Trunk flexed     General Gait Details: Cues for posture.  Pt with one episode of slight LOB after turning which pt relates was from feeling lightheaded for a brief moment, but did not continue.  Stairs            Wheelchair Mobility     Modified Rankin (Stroke Patients Only)       Balance Overall balance assessment: Needs assistance Sitting-balance support: Feet supported Sitting balance-Leahy Scale: Good       Standing balance-Leahy Scale: Fair                               Pertinent Vitals/Pain Pain Assessment: No/denies pain    Home Living Family/patient expects to be discharged to:: Skilled nursing facility                 Additional Comments: Has been at [facility] since last dc from hospital ~ 3 weeks ago    Prior Function Level of Independence: Needs assistance   Gait / Transfers Assistance Needed: Going to therapy and walking short distances with a RW.  Propels self throughout facility with UE/LE in w/c.           Hand Dominance   Dominant Hand: Right    Extremity/Trunk Assessment   Upper Extremity Assessment: Overall WFL for tasks assessed           Lower Extremity Assessment: Overall WFL for tasks assessed      Cervical / Trunk Assessment: Normal  Communication   Communication: No difficulties  Cognition Arousal/Alertness: Awake/alert Behavior During Therapy: WFL for tasks assessed/performed Overall Cognitive Status: Within Functional Limits for tasks assessed                      General Comments      Exercises  Assessment/Plan    PT Assessment Patient needs continued PT services  PT Diagnosis Difficulty walking   PT Problem List Decreased strength;Decreased activity tolerance;Decreased balance;Decreased mobility  PT Treatment Interventions Gait training;Functional mobility training;Therapeutic activities;Therapeutic exercise;Balance training;Patient/family education   PT Goals (Current goals can be found in the Care Plan section) Acute Rehab PT Goals Patient Stated Goal: To return to [facility] care PT Goal Formulation: With patient Time For Goal Achievement: 09/25/15 Potential to Achieve Goals: Good     Frequency Min 3X/week   Barriers to discharge        Co-evaluation               End of Session Equipment Utilized During Treatment: Gait belt Activity Tolerance: Patient tolerated treatment well Patient left: in chair;with call bell/phone within reach;with chair alarm set Nurse Communication: Mobility status    Functional Assessment Tool Used: clinical judgement and objective findings. Functional Limitation: Mobility: Walking and moving around Mobility: Walking and Moving Around Current Status [phone]): At least 1 percent but less than 20 percent impaired, limited or restricted Mobility: Walking and Moving Around Goal Status [phone]): At least 1 percent but less than 20 percent impaired, limited or restricted    Time: 0904-0929 PT Time Calculation (min) (ACUTE ONLY): 25 min   Charges:   PT Evaluation $PT Eval Low Complexity: 1 Procedure PT Treatments $Gait Training: 8-22 mins   PT G Codes:   PT G-Codes **NOT FOR INPATIENT CLASS** Functional Assessment Tool Used: clinical judgement and objective findings. Functional Limitation: Mobility: Walking and moving around Mobility: Walking and Moving Around Current Status [phone]): At least 1 percent but less than 20 percent impaired, limited or restricted Mobility: Walking and Moving Around Goal Status [phone]): At least 1 percent but less than 20 percent impaired, limited or restricted    [facility] 09/18/2015, 9:46 AM

## 2015-09-18 NOTE — Discharge Summary (Signed)
Physician Discharge Summary  [name] [id number] DOB: [birth year] DOA: 09/17/2015  PCP: [name] with M.D. at [place] date: 09/17/2015 Discharge date: 09/18/2015  Time spent: 25 minutes  Recommendations for Outpatient Follow-up:  Discharged back to [facility] facility. Follow-up with outpatient dialysis.  Discharge Diagnoses:  Principal Problem:   Acute hyperkalemia   Active Problems:   Anemia in chronic kidney disease   ESRD on hemodialysis (HCC)   Seizure disorder (HCC)   Tobacco abuse   Protein-calorie malnutrition, severe (HCC)   Hyperkalemia   Physical deconditioning   Polysubstance abuse   HLD (hyperlipidemia)   ESRD on dialysis [facility])   Discharge Condition: Fair  Diet recommendation: Renal  Filed Weights   09/17/15 2047  Weight: 61.2 kg (134 lb 14.7 oz)    History of present illness:  60 year old male with history of ESRD on dialysis, hypertension, CHF, prior substance abuse, hep C, hep B, history of seizures sent from skilled nursing facility with generalized weakness with elevated potassium, nausea and vomiting. Patient reported being weak and missing his dialysis on the day prior to admission stating that he felt nauseous and vomited and did not feel well to go for dialysis. He denied any headache, blurred vision, dizziness, chest, palpitations, shortness of breath, abdominal pain, dysuria or diarrhea. Denied any fevers or chills. Denies recent illness or sick contact. Potassium at the nursing facility was 7.7. He was sent to the ED. In the ED vitals were stable. Blood work showed potassium >7.5, BUN of 163 and creatinine of 11.1. Renal consulted and patient taken for urgent dialysis.  Hospital Course:  Acute hyperkalemia   likely due to missed dialysis session. Patient underwent urgent dialysis on 5/31 and repeat today. Follow-up potassium normalized. Patient clearly instructed that he should not miss his scheduled  dialysis. He can be discharged back to skilled nursing facility. Will resume his routine hemodialysis as outpatient.   End-stage renal disease Received dialysis on 5/31 and 6/1. Resume scheduled dialysis as outpatient (Tu. Th sat)  Protein calorie malnutrition with physical deconditioning Report generalized weakness for several days. Seen by physical therapy and recommended follow-up at skilled nursing facility.  Essential hypertension Resume all home medications.   Polysubstance abuse Counseled on smoking cessation. Urine drug screen done 4 weeks back was positive for cocaine. Patient counseled.  Seizure disorder Continue Keppra.   Hyperlipidemia Continue statin.  Anemia of chronic kidney disease No history of signs of GI bleed. Receiving Aranesp as outpatient.     Procedures:  Hemodialysis  Consultations:  renal  Discharge Exam: Filed Vitals:   09/18/15 0900 09/18/15 1310  BP: 160/70 126/54  Pulse: 85 86  Temp: 98 F (36.7 C)   Resp: 33 70    General:Middle aged male not in distress  HEENT: Moist mucosa, supple neck chest: Clear bilaterally  CVS: Normal S1 and S2, no murmurs GI: Soft, nondistended, nontender, bowel sounds present Musculoskeletal: Warm, no edema  CNS: Alert and oriented   Discharge Instructions    Current Discharge Medication List    CONTINUE these medications which have NOT CHANGED   Details  aspirin EC 81 MG EC tablet Take 1 tablet (81 mg total) by mouth daily. Qty: 30 tablet, Refills: 3    atorvastatin (LIPITOR) 40 MG tablet Take 1 tablet (40 mg total) by mouth at bedtime. Qty: 30 tablet, Refills: 3    calcium acetate (PHOSLO) 667 MG capsule Take 2,001 mg by mouth 3 (three) times daily with meals.  gabapentin (NEURONTIN) 100 MG capsule Take 100 mg by mouth every 12 (twelve) hours.    hydrALAZINE (APRESOLINE) 50 MG tablet Take 1 tablet (50 mg total) by mouth 3 (three) times daily. Qty: 90 tablet, Refills: 3     HYDROcodone-acetaminophen (NORCO/VICODIN) 5-325 MG tablet Take 1 tablet by mouth every 6 (six) hours as needed for moderate pain.    levETIRAcetam (KEPPRA) 1000 MG tablet Take 1 tablet (1,000 mg total) by mouth daily. Qty: 30 tablet, Refills: 0    losartan (COZAAR) 100 MG tablet Take 100 mg by mouth every evening.    Melatonin 3 MG TABS Take 3 mg by mouth at bedtime.    ondansetron (ZOFRAN-ODT) 4 MG disintegrating tablet Take 4 mg by mouth every 8 (eight) hours as needed for nausea or vomiting.    polyethylene glycol (MIRALAX / GLYCOLAX) packet Take 17 g by mouth daily. Qty: 14 each, Refills: 0    senna-docusate (SENOKOT-S) 8.6-50 MG tablet Take 1 tablet by mouth 2 (two) times daily. For constipation Qty: 60 tablet, Refills: 3    sodium polystyrene (KAYEXALATE) 15 GM/60ML suspension Take 15 g by mouth once.    verapamil (CALAN) 120 MG tablet Take 1 tablet (120 mg total) by mouth every 8 (eight) hours. Qty: 60 tablet, Refills: 3       Allergies  Allergen Reactions  . Reglan [Metoclopramide] Other (See Comments)    Tardive dyskinesia in 11/2011 in [place]   Follow-up Information    Please follow up.   Why:  MD at SNF in 1 week      Please follow up.   Why:  outpatient HD       The results of significant diagnostics from this hospitalization (including imaging, microbiology, ancillary and laboratory) are listed below for reference.    Significant Diagnostic Studies: Dg Abd Portable 1v  08/22/2015  CLINICAL DATA:  Abdominal pain and constipation EXAM: PORTABLE ABDOMEN - 1 VIEW COMPARISON:  CT abdomen and pelvis Aug 25, 2013 FINDINGS: There is fairly diffuse stool throughout the colon. Stomach is mildly distended with air. There is no small or large bowel dilatation. No air-fluid level. No free air evident. Lung bases clear. Surgical clips are noted in the right upper quadrant. There is arterial vascular calcification in the pelvis. IMPRESSION: Fairly diffuse stool throughout  colon. No bowel obstruction or free air. Stomach is mildly distended with air. Lung bases clear. Electronically Signed   By: Lowella Grip III M.D.   On: 08/22/2015 18:53    Microbiology: No results found for this or any previous visit (from the past 240 hour(s)).   Labs: Basic Metabolic Panel:  Recent Labs Lab 09/17/15 1306 09/17/15 1326 09/18/15 0450  NA 132*  --  135  K >7.5*  --  4.1  CL 95*  --  95*  CO2 16*  --  29  GLUCOSE 75 76 72  BUN 163*  --  35*  CREATININE 11.18*  --  4.76*  CALCIUM 9.5  --  8.5*   Liver Function Tests:  Recent Labs Lab 09/18/15 0450  AST 28  ALT 26  ALKPHOS 56  BILITOT 0.3  PROT 6.7  ALBUMIN 2.7*   No results for input(s): LIPASE, AMYLASE in the last 168 hours. No results for input(s): AMMONIA in the last 168 hours. CBC:  Recent Labs Lab 09/18/15 0450  WBC 3.5*  HGB 7.2*  HCT 22.7*  MCV 83.5  PLT 146*   Cardiac Enzymes: No results for input(s): CKTOTAL, CKMB,  CKMBINDEX, TROPONINI in the last 168 hours. BNP: BNP (last 3 results)  Recent Labs  08/16/15 2256  BNP 157.1*    ProBNP (last 3 results) No results for input(s): PROBNP in the last 8760 hours.  CBG:  Recent Labs Lab 09/17/15 1401 09/17/15 2044 09/17/15 2143  GLUCAP 70 65 63*       Signed:  Louellen Molder MD.  [facility] 09/18/2015, 2:17 PM

## 2015-09-18 NOTE — NC FL2 (Signed)
[place] MEDICAID FL2 LEVEL OF CARE SCREENING TOOL     IDENTIFICATION  Patient Name: [name] Birthdate: May 09, 1955 Sex: male Admission Date (Current Location): 09/17/2015  [facility] and [place] Number:  Herbalist and Address:  The . [facility], [place] [address], [place], [place] [address]      Provider Number: [id number]  Attending Physician Name and Address:  Louellen Molder, MD  Relative Name and Phone Number:  Belinda Block, friend.  Phone [phone]    Current Level of Care: Hospital Recommended Level of Care: [place] Prior Approval Number:    Date Approved/Denied:   PASRR Number: [id number]  (Eff. 08/26/15-09/25/15)  Discharge Plan: SNF    Current Diagnoses: Patient Active Problem List   Diagnosis Date Noted  . Anemia in chronic kidney disease 09/17/2015  . Polysubstance abuse 09/17/2015  . HLD (hyperlipidemia) 09/17/2015  . Acute hyperkalemia   . ESRD on dialysis ([place])   . Physical deconditioning   . Constipation   . EKG, abnormal   . Alcoholism /alcohol abuse ([place])   . NSTEMI (non-ST elevated myocardial infarction) ([place]) 08/16/2015  . Hypertensive emergency 08/16/2015  . Hepatitis C antibody test positive 06/23/2015  . History of hepatitis B virus infection 06/20/2015  . Acute encephalopathy 06/20/2015  . Acute uremia   . Hyperkalemia   . Alcohol withdrawal ([place]) 06/19/2015  . Acute GI bleeding 04/12/2015  . LVH (left ventricular hypertrophy) due to hypertensive disease 04/12/2015  . Prolonged Q-T interval on ECG 04/12/2015  . Bleeding gastrointestinal   . Anemia of chronic kidney failure 04/26/2014  . Hyperparathyroidism, secondary renal ([place]) 04/26/2014  . Protein-calorie malnutrition, severe ([place]) 01/25/2014  . Optic neuritis, left 01/19/2014  . Acute blood loss anemia 08/21/2013  . Cocaine abuse 03/02/2012  . Tobacco abuse 03/02/2012  . Hypertension   . ESRD on hemodialysis  ([place])   . Seizure disorder ([place])     Orientation RESPIRATION BLADDER Height & Weight     Self, Time, Situation, Place  Normal Continent Weight: 134 lb 14.7 oz (61.2 kg) Height:     BEHAVIORAL SYMPTOMS/MOOD NEUROLOGICAL BOWEL NUTRITION STATUS    Convulsions/Seizures (History of seizure disorder) Continent Diet (Renal with 1200 mL fluid restriction)  AMBULATORY STATUS COMMUNICATION OF NEEDS Skin   Limited Assist Verbally Normal                       Personal Care Assistance Level of Assistance  Bathing, Feeding, Dressing Bathing Assistance: Limited assistance Feeding assistance: Independent Dressing Assistance: Limited assistance     Functional Limitations Info  Sight, Hearing, Speech Sight Info: Adequate Hearing Info: Adequate Speech Info: Adequate    SPECIAL CARE FACTORS FREQUENCY  PT (By licensed PT)     PT Frequency: Comment: Evaluated 6/1 and a minimum of 3X per week recommended for therapy              Contractures Contractures Info: Not present    Additional Factors Info  Code Status, Allergies Code Status Info: Full code Allergies Info: Reglan           Current Medications (09/18/2015):  This is the current hospital active medication list Current Facility-Administered Medications  Medication Dose Route Frequency Provider Last Rate Last Dose  . aspirin EC tablet 81 mg  81 mg Oral Daily Waldemar Dickens, MD   81 mg at 09/18/15 0900  . atorvastatin (LIPITOR) tablet 40 mg  40 mg Oral QHS Shanon Brow  Nada Maclachlan, MD   40 mg at 09/17/15 2108  . calcium acetate (PHOSLO) capsule 2,001 mg  2,001 mg Oral TID WC Waldemar Dickens, MD   2,001 mg at 09/18/15 0900  . gabapentin (NEURONTIN) capsule 100 mg  100 mg Oral Q12H Waldemar Dickens, MD   100 mg at 09/18/15 0900  . heparin injection 5,000 Units  5,000 Units Subcutaneous Q8H Waldemar Dickens, MD   5,000 Units at 09/17/15 2110  . hydrALAZINE (APRESOLINE) tablet 50 mg  50 mg Oral TID Waldemar Dickens, MD   50 mg at 09/18/15  0900  . HYDROcodone-acetaminophen (NORCO/VICODIN) 5-325 MG per tablet 1 tablet  1 tablet Oral Q6H PRN Waldemar Dickens, MD   1 tablet at 09/18/15 0941  . levETIRAcetam (KEPPRA) tablet 1,000 mg  1,000 mg Oral Daily Waldemar Dickens, MD   1,000 mg at 09/18/15 0900  . losartan (COZAAR) tablet 100 mg  100 mg Oral QPM Waldemar Dickens, MD   100 mg at 09/17/15 2115  . Melatonin TABS 3 mg  3 mg Oral QHS Waldemar Dickens, MD   3 mg at 09/17/15 2108  . nicotine (NICODERM CQ - dosed in mg/24 hours) patch 14 mg  14 mg Transdermal Daily Waldemar Dickens, MD   14 mg at 09/17/15 2100  . polyethylene glycol (MIRALAX / GLYCOLAX) packet 17 g  17 g Oral Daily Waldemar Dickens, MD   17 g at 09/17/15 2117  . promethazine (PHENERGAN) tablet 12.5 mg  12.5 mg Oral Q6H PRN Waldemar Dickens, MD      . senna-docusate (Senokot-S) tablet 1 tablet  1 tablet Oral BID Waldemar Dickens, MD   1 tablet at 09/17/15 2108  . sodium chloride flush (NS) 0.9 % injection 3 mL  3 mL Intravenous Q12H Waldemar Dickens, MD   3 mL at 09/17/15 2111  . verapamil (CALAN) tablet 120 mg  120 mg Oral Q8H Waldemar Dickens, MD   120 mg at 09/17/15 2100     Discharge Medications: Please see discharge summary for a list of discharge medications.  Relevant Imaging Results:  Relevant Lab Results:   Additional Information ss#[id number]. Patient receives dialysis TTS at [facility] kidney center.  Sable Feil, LCSW

## 2015-09-18 NOTE — Care Management Obs Status (Signed)
[place] NOTIFICATION   Patient Details  Name: [name] MRN: [id number] Date of Birth: 01-Feb-1956   Medicare Observation Status Notification Given:  Yes    Jaima Janney, Rory Percy, RN 09/18/2015, 11:33 AM

## 2015-09-18 NOTE — Clinical Social Work Note (Signed)
Clinical Social Work Assessment  Patient Details  Name: [name] MRN: [id number] Date of Birth: [birth year]  Date of referral:  09/18/15               Reason for consult:  Facility Placement                Permission sought to share information with:  Facility Sport and exercise psychologist, Family Supports Permission granted to share information::  Yes, Verbal Permission Granted  Name::     [name]  Agency::     Relationship::  Friend  Contact Information:  [phone]  Housing/Transportation Living arrangements for the past 2 months:  [place] ([place]) Source of Information:  Patient Patient Interpreter Needed:  None Criminal Activity/Legal Involvement Pertinent to Current Situation/Hospitalization:  No - Comment as needed Significant Relationships:  Friend [name]) Lives with:  Facility Resident Do you feel safe going back to the place where you live?  Yes Need for family participation in patient care:  No (Coment)  Care giving concerns:  Patient lives alone and has been at [facility] since 08/26/15. His initial admit date to facility was 01/29/14.   Social Worker assessment / plan:  CSW talked with patient at the bedside regarding discharge planning as he is ready for d/c today. Patient was sitting up in a chair and is alert and oriented X4. [name] confirmed that he plans to return to [facility]  Employment status:  Disabled (Comment on whether or not currently receiving Disability) Insurance information:  Medicare, Medicaid In [place] PT Recommendations:  [place] / Referral to community resources:  Other (Comment Required) (None needed or requested as patient returning to [facility])  Patient/Family's Response to care:  No concerns expressed by patient regarding his care during hospitalization.  Patient/Family's Understanding of and Emotional Response to Diagnosis, Current Treatment, and Prognosis:    Not discussed.  Emotional Assessment Appearance:  Appears older than stated age Attitude/Demeanor/Rapport:  Other (Appropriate) Affect (typically observed):  Accepting, Pleasant, Appropriate Orientation:  Oriented to Self, Oriented to Place, Oriented to  Time, Oriented to Situation Alcohol / Substance use:  Tobacco Use, Illicit Drugs, Alcohol Use (Patient reported that he smokes cigarettes and last used illicit drugs (crack cocaine  months ago). Patient denied alcohol use.) Psych involvement (Current and /or in the community):  No (Comment)  Discharge Needs  Concerns to be addressed:  Discharge Planning Concerns Readmission within the last 30 days:  No Current discharge risk:  None Barriers to Discharge:  No Barriers Identified   Sable Feil, LCSW 09/18/2015, 1:37 PM

## 2015-09-18 NOTE — Clinical Social Work Note (Signed)
CSW spoke with Santiago Glad (admissions Mudlogger) at [facility] regarding time patient will be leaving hospital. As of call to [facility] (3:14 pm) patient had not been taken to dialysis. Patient's nurse contacted dialysis center and was advised that it would be late when patient taken for treatment. Per Santiago Glad, patient can come tonight after dialysis, even if it is after 8 pm. She will inform nursing staff.  Patient's nurse advised.  Kelli Robeck [name], MSW, LCSW Licensed Clinical Social Worker [place] [phone]

## 2015-09-18 NOTE — Progress Notes (Addendum)
  [facility] Progress Note   Subjective: K down, 4.5  Filed Vitals:   09/17/15 1933 09/17/15 2047 09/18/15 0435 09/18/15 0900  BP: 180/74 183/65 165/69 160/70  Pulse: 87 91 84 85  Temp: 97.6 F (36.4 C) 98.4 F (36.9 C) 98.2 F (36.8 C) 98 F (36.7 C)  TempSrc: Oral   Oral  Resp: 19 18 20 30   Weight:  61.2 kg (134 lb 14.7 oz)    SpO2: 98% 98% 98% 98%    Inpatient medications: . aspirin EC  81 mg Oral Daily  . atorvastatin  40 mg Oral QHS  . calcium acetate  2,001 mg Oral TID WC  . gabapentin  100 mg Oral Q12H  . heparin  5,000 Units Subcutaneous Q8H  . hydrALAZINE  50 mg Oral TID  . levETIRAcetam  1,000 mg Oral Daily  . losartan  100 mg Oral QPM  . Melatonin  3 mg Oral QHS  . nicotine  14 mg Transdermal Daily  . polyethylene glycol  17 g Oral Daily  . senna-docusate  1 tablet Oral BID  . sodium chloride flush  3 mL Intravenous Q12H  . verapamil  120 mg Oral Q8H     HYDROcodone-acetaminophen, promethazine  Exam: Awake, fatigues, no distress No jvd or bruits Chest clear bilat RRR 2/6 SEM , PMI displaced and sustained, no S3 Abd soft ntnd no mass or ascites +bs  GU normal male MS no joint effusions or deformity Ext no sig LE or UE edema Neuro is alert, Ox 3 , nf LUA AVF +bruit   Dialysis: TTS AF 4h 3K/2.0 bath LUA AVF Hep none  EDW 56kg   Assessment: 1 Gen weakness / severe hyperkalemia - better after HD 2 ESRD missed HD, usual TTS 3 HTN on verap/ losartan/ hydral  4 Hx drug abuse 5 Hx anemia, recurrent - Hb 7.2 6 Seizure on keppra 7.  Lightheadness - check orthostatics 8.  Volume - gaining sig wt at SNF, raise dry wt to 62kg, have d/w unit manager   Plan - HD again today to get back on sched. OK for dc after HD, raise edw 62kg  Kelly Splinter MD [place] pager [phone]    cell [phone] 09/18/2015, 10:32 AM    Recent Labs Lab 09/17/15 1306 09/17/15 1326 09/18/15 0450  NA 132*  --  135  K  >7.5*  --  4.1  CL 95*  --  95*  CO2 16*  --  29  GLUCOSE 75 76 72  BUN 163*  --  35*  CREATININE 11.18*  --  4.76*  CALCIUM 9.5  --  8.5*    Recent Labs Lab 09/18/15 0450  AST 28  ALT 26  ALKPHOS 56  BILITOT 0.3  PROT 6.7  ALBUMIN 2.7*    Recent Labs Lab 09/18/15 0450  WBC 3.5*  HGB 7.2*  HCT 22.7*  MCV 83.5  PLT 146*

## 2015-09-18 NOTE — Progress Notes (Signed)
OT Cancellation Note  Patient Details Name: [name] MRN: [id number] DOB: Aug 10, 1955   Cancelled Treatment:    Reason Eval/Treat Not Completed:  (Pt admitted from SNF and returning after HD today per PT.) Will defer OT evaluation to SNF.  Malka So 09/18/2015, 11:22 AM  [phone]

## 2015-09-19 DIAGNOSIS — E875 Hyperkalemia: Secondary | ICD-10-CM | POA: Diagnosis not present

## 2015-11-14 ENCOUNTER — Encounter: Payer: Self-pay | Admitting: Vascular Surgery

## 2015-11-20 ENCOUNTER — Encounter (HOSPITAL_COMMUNITY): Payer: Medicare Other

## 2015-11-20 ENCOUNTER — Encounter: Payer: Medicare Other | Admitting: Vascular Surgery

## 2015-12-03 ENCOUNTER — Encounter (HOSPITAL_COMMUNITY): Payer: Self-pay

## 2015-12-03 ENCOUNTER — Inpatient Hospital Stay (HOSPITAL_COMMUNITY)
Admission: EM | Admit: 2015-12-03 | Discharge: 2015-12-08 | DRG: 377 | Disposition: A | Discharge status: Skilled Nursing Facility | Payer: Medicare Other | Attending: Internal Medicine | Admitting: Internal Medicine

## 2015-12-03 ENCOUNTER — Emergency Department (HOSPITAL_COMMUNITY): Payer: Medicare Other

## 2015-12-03 DIAGNOSIS — G9340 Encephalopathy, unspecified: Secondary | ICD-10-CM | POA: Diagnosis present

## 2015-12-03 DIAGNOSIS — R34 Anuria and oliguria: Secondary | ICD-10-CM | POA: Diagnosis present

## 2015-12-03 DIAGNOSIS — F1910 Other psychoactive substance abuse, uncomplicated: Secondary | ICD-10-CM | POA: Diagnosis not present

## 2015-12-03 DIAGNOSIS — I160 Hypertensive urgency: Secondary | ICD-10-CM | POA: Diagnosis present

## 2015-12-03 DIAGNOSIS — I2510 Atherosclerotic heart disease of native coronary artery without angina pectoris: Secondary | ICD-10-CM | POA: Diagnosis present

## 2015-12-03 DIAGNOSIS — Z7982 Long term (current) use of aspirin: Secondary | ICD-10-CM | POA: Diagnosis not present

## 2015-12-03 DIAGNOSIS — K921 Melena: Secondary | ICD-10-CM | POA: Diagnosis present

## 2015-12-03 DIAGNOSIS — E1122 Type 2 diabetes mellitus with diabetic chronic kidney disease: Secondary | ICD-10-CM | POA: Diagnosis present

## 2015-12-03 DIAGNOSIS — N2581 Secondary hyperparathyroidism of renal origin: Secondary | ICD-10-CM | POA: Diagnosis present

## 2015-12-03 DIAGNOSIS — D631 Anemia in chronic kidney disease: Secondary | ICD-10-CM | POA: Diagnosis present

## 2015-12-03 DIAGNOSIS — G40909 Epilepsy, unspecified, not intractable, without status epilepticus: Secondary | ICD-10-CM | POA: Diagnosis present

## 2015-12-03 DIAGNOSIS — Z720 Tobacco use: Secondary | ICD-10-CM | POA: Diagnosis present

## 2015-12-03 DIAGNOSIS — N189 Chronic kidney disease, unspecified: Secondary | ICD-10-CM

## 2015-12-03 DIAGNOSIS — Z992 Dependence on renal dialysis: Secondary | ICD-10-CM | POA: Diagnosis not present

## 2015-12-03 DIAGNOSIS — Z9119 Patient's noncompliance with other medical treatment and regimen: Secondary | ICD-10-CM | POA: Diagnosis not present

## 2015-12-03 DIAGNOSIS — R768 Other specified abnormal immunological findings in serum: Secondary | ICD-10-CM | POA: Diagnosis present

## 2015-12-03 DIAGNOSIS — R894 Abnormal immunological findings in specimens from other organs, systems and tissues: Secondary | ICD-10-CM | POA: Diagnosis not present

## 2015-12-03 DIAGNOSIS — D62 Acute posthemorrhagic anemia: Secondary | ICD-10-CM | POA: Diagnosis present

## 2015-12-03 DIAGNOSIS — Z96652 Presence of left artificial knee joint: Secondary | ICD-10-CM | POA: Diagnosis present

## 2015-12-03 DIAGNOSIS — I5032 Chronic diastolic (congestive) heart failure: Secondary | ICD-10-CM | POA: Diagnosis present

## 2015-12-03 DIAGNOSIS — Z79899 Other long term (current) drug therapy: Secondary | ICD-10-CM

## 2015-12-03 DIAGNOSIS — I132 Hypertensive heart and chronic kidney disease with heart failure and with stage 5 chronic kidney disease, or end stage renal disease: Secondary | ICD-10-CM | POA: Diagnosis present

## 2015-12-03 DIAGNOSIS — E11649 Type 2 diabetes mellitus with hypoglycemia without coma: Secondary | ICD-10-CM | POA: Diagnosis present

## 2015-12-03 DIAGNOSIS — Z89511 Acquired absence of right leg below knee: Secondary | ICD-10-CM

## 2015-12-03 DIAGNOSIS — Z888 Allergy status to other drugs, medicaments and biological substances status: Secondary | ICD-10-CM | POA: Diagnosis not present

## 2015-12-03 DIAGNOSIS — K922 Gastrointestinal hemorrhage, unspecified: Secondary | ICD-10-CM

## 2015-12-03 DIAGNOSIS — R4182 Altered mental status, unspecified: Secondary | ICD-10-CM

## 2015-12-03 DIAGNOSIS — E785 Hyperlipidemia, unspecified: Secondary | ICD-10-CM | POA: Diagnosis present

## 2015-12-03 DIAGNOSIS — F17210 Nicotine dependence, cigarettes, uncomplicated: Secondary | ICD-10-CM | POA: Diagnosis present

## 2015-12-03 DIAGNOSIS — N186 End stage renal disease: Secondary | ICD-10-CM | POA: Diagnosis present

## 2015-12-03 DIAGNOSIS — B1920 Unspecified viral hepatitis C without hepatic coma: Secondary | ICD-10-CM | POA: Diagnosis present

## 2015-12-03 LAB — I-STAT CG4 LACTIC ACID, ED
Lactic Acid, Venous: 0.39 mmol/L — ABNORMAL LOW (ref 0.5–1.9)
Lactic Acid, Venous: 0.38 mmol/L — ABNORMAL LOW (ref 0.5–1.9)

## 2015-12-03 LAB — COMPREHENSIVE METABOLIC PANEL
ALK PHOS: 62 U/L (ref 38–126)
ALT: 10 U/L — AB (ref 17–63)
AST: 18 U/L (ref 15–41)
Albumin: 1.9 g/dL — ABNORMAL LOW (ref 3.5–5.0)
Anion gap: 7 (ref 5–15)
BUN: 21 mg/dL — AB (ref 6–20)
CALCIUM: 8.5 mg/dL — AB (ref 8.9–10.3)
CO2: 31 mmol/L (ref 22–32)
CREATININE: 5.25 mg/dL — AB (ref 0.61–1.24)
Chloride: 99 mmol/L — ABNORMAL LOW (ref 101–111)
GFR, EST AFRICAN AMERICAN: 13 mL/min — AB (ref >60)
GFR, EST NON AFRICAN AMERICAN: 11 mL/min — AB (ref >60)
Glucose, Bld: 59 mg/dL — ABNORMAL LOW (ref 65–99)
Potassium: 4.3 mmol/L (ref 3.5–5.1)
Sodium: 137 mmol/L (ref 135–145)
Total Bilirubin: 0.7 mg/dL (ref 0.3–1.2)
Total Protein: 5.4 g/dL — ABNORMAL LOW (ref 6.5–8.1)

## 2015-12-03 LAB — PREPARE RBC (CROSSMATCH)

## 2015-12-03 LAB — PROTIME-INR
INR: 1.23
PROTHROMBIN TIME: 15.6 s — AB (ref 11.4–15.2)

## 2015-12-03 LAB — I-STAT TROPONIN, ED: TROPONIN I, POC: 0.05 ng/mL (ref 0.00–0.08)

## 2015-12-03 LAB — APTT: APTT: 43 s — AB (ref 24–36)

## 2015-12-03 MED ORDER — SODIUM CHLORIDE 0.9 % IV SOLN: 10.0000 mL/h | Freq: Once | INTRAVENOUS | Status: DC

## 2015-12-03 MED ORDER — VANCOMYCIN HCL IN DEXTROSE 1-5 GM/200ML-% IV SOLN
1000.0000 mg | Freq: Once | INTRAVENOUS | Status: AC
  Administered 2015-12-03: 1000 mg via INTRAVENOUS
  Filled 2015-12-03: qty 200

## 2015-12-03 MED ORDER — SODIUM CHLORIDE 0.9 % IV SOLN
80.0000 mg | Freq: Once | INTRAVENOUS | Status: AC
  Administered 2015-12-03: 80 mg via INTRAVENOUS
  Filled 2015-12-03 (×2): qty 80

## 2015-12-03 MED ORDER — PIPERACILLIN-TAZOBACTAM 3.375 G IVPB 30 MIN
3.3750 g | Freq: Once | INTRAVENOUS | Status: AC
  Administered 2015-12-03: 3.375 g via INTRAVENOUS
  Filled 2015-12-03: qty 50

## 2015-12-03 MED ORDER — HYDROCODONE-ACETAMINOPHEN 5-325 MG PO TABS
1.0000 | ORAL_TABLET | Freq: Once | ORAL | Status: AC
  Administered 2015-12-03: 1 via ORAL
  Filled 2015-12-03: qty 1

## 2015-12-03 MED ORDER — SODIUM CHLORIDE 0.9 % IV SOLN
8.0000 mg/h | INTRAVENOUS | Status: DC
  Filled 2015-12-03 (×2): qty 80

## 2015-12-03 NOTE — ED Provider Notes (Signed)
[facility] DEPT Provider Note   CSN: [id number] Arrival date & time: 12/03/15  1425     History   Chief Complaint Chief Complaint  Patient presents with  . Fever    HPI [name] is a 60 y.o. male.  HPI 60 year old male who presents with GI bleeding and fever. He presents from a skilled nursing facility. He has a history of end-stage renal disease on hemodialysis, diastolic heart failure, CAD, hypertension, and seizure disorder. He was recently admitted at the beginning of August for septic shock due to necrotic right foot infection status post left BKA by Dr. Georgia Lopes from [facility] health care. He was discharged to a skilled nursing facility. He was noted to have bloody stools today, which she reports 3 episodes of. [name] not on blood thinners. States that he has had rectal bleeding in the past and has had workup for this with no clear source of bleeding. Last evaluated in the hospital in 2015 by Dr. Hilarie Fredrickson and has had negative capsule endoscopy in 2015. He was also noted to have fever of 102 Fahrenheit at his facility today. He reports feeling weak recently. His had mild cough productive of sputum. Denies any chest pain, dyspnea, syncope or near syncope, abdominal pain, urinary complaints, nausea or vomiting. Past Medical History:  Diagnosis Date  . Anemia, chronic disease    a. Capsule endoscopy reportedly negative 08/2013. b. seen by heme with concern for minor B cell population on BMB, being observed.  . Arthritis    "right shoulder" (11/09/2012)  . Chronic diastolic CHF (congestive heart failure) ([place])    a. EF 60 - 65% per [place] echo 11/2011. b. Echo 02/2012: severe LVH, focal basal hypertrophy, EF 60-65%, mild Mr.  . Cocaine abuse    mentioned in notes from [facility]  . ESRD (end stage renal disease) on dialysis [facility])    "TTS; [address]" (06/19/2015)  . ESRD on hemodialysis ([place]) since 2012   a. Since 2012. ESRD was due to "drugs", primarily used cocaine.  Has 3-5 year hx of  HTN, no DM.  Gets HD on TTS schedule at [place]. Originally from [place].   . GI bleed 05/04/2012  . Head injury, closed, with concussion ([place])   . Headache(784.0)   . Helicobacter pylori gastritis    not defined if this was treated  . Hematochezia   . Hepatitis B core antibody positive    03/01/10  . Hepatitis C antibody test positive    was HIV negative, 02/28/12  . History of blood transfusion   . Hypercalcemia   . Hyperpotassemia   . Hypertension   . Hypertensive heart disease   . Optic neuritis, left   . Polyp of colon, adenomatous    May 2012.  Dr Trenton Founds in [place]  . Positive QuantiFERON-TB Gold test    11/2011  . Protein-calorie malnutrition, severe ([place])   . Seizure disorder [facility])    questionable history of - will need to clarify with PCP  . Tobacco abuse     Patient Active Problem List   Diagnosis Date Noted  . GI bleed 12/03/2015  . Anemia in chronic kidney disease 09/17/2015  . Polysubstance abuse 09/17/2015  . HLD (hyperlipidemia) 09/17/2015  . Acute hyperkalemia   . ESRD on dialysis ([place])   . Physical deconditioning   . Constipation   . EKG, abnormal   . Alcoholism /alcohol abuse ([place])   . NSTEMI (non-ST elevated myocardial infarction) ([place]) 08/16/2015  . Hypertensive emergency  08/16/2015  . Hepatitis C antibody test positive 06/23/2015  . History of hepatitis B virus infection 06/20/2015  . Acute encephalopathy 06/20/2015  . Acute uremia   . Hyperkalemia   . Alcohol withdrawal ([place]) 06/19/2015  . Acute GI bleeding 04/12/2015  . LVH (left ventricular hypertrophy) due to hypertensive disease 04/12/2015  . Prolonged Q-T interval on ECG 04/12/2015  . Bleeding gastrointestinal   . Anemia of chronic kidney failure 04/26/2014  . Hyperparathyroidism, secondary renal ([place]) 04/26/2014  . Protein-calorie malnutrition, severe ([place]) 01/25/2014  . Optic neuritis, left 01/19/2014  . Acute blood loss anemia 08/21/2013  . Cocaine abuse 03/02/2012  .  Tobacco abuse 03/02/2012  . Hypertension   . ESRD on hemodialysis ([place])   . Seizure disorder [facility])     Past Surgical History:  Procedure Laterality Date  . [place] TRANSPOSITION  03/07/2012   Procedure: BASCILIC VEIN TRANSPOSITION;  Surgeon: Conrad Springerton, MD;  Location: [facility];  Service: Vascular;  Laterality: Left;  First Stage  . BASCILIC VEIN TRANSPOSITION Left 05/31/2012   Procedure: BASCILIC VEIN TRANSPOSITION;  Surgeon: Conrad Ravenwood, MD;  Location: [facility];  Service: Vascular;  Laterality: Left;  Left 2nd Stage Basilic Vein Transposition with gortex graft revision using 29mmx10cm graft  . [name] CAPSULE STUDY N/A 08/27/2013   Procedure: [name] CAPSULE STUDY;  Surgeon: Milus Banister, MD;  Location: [facility];  Service: Endoscopy;  Laterality: N/A;  . INSERTION OF DIALYSIS CATHETER     right chest  . JOINT REPLACEMENT    . SHOULDER OPEN ROTATOR CUFF REPAIR Right   . SHOULDER OPEN ROTATOR CUFF REPAIR Right   . TOTAL KNEE ARTHROPLASTY Left        Home Medications    Prior to Admission medications   Medication Sig Start Date End Date Taking? Authorizing Provider  amantadine (SYMMETREL) 100 MG capsule Take 100 mg by mouth 3 (three) times daily.   Yes Historical Provider, MD  aspirin EC 81 MG EC tablet Take 1 tablet (81 mg total) by mouth daily. 08/20/15  Yes Ripudeep Krystal Eaton, MD  atorvastatin (LIPITOR) 40 MG tablet Take 1 tablet (40 mg total) by mouth at bedtime. 08/20/15  Yes Ripudeep Krystal Eaton, MD  B Complex-C-Folic Acid (RENA-VITE PO) Take 1 tablet by mouth daily.   Yes Historical Provider, MD  calcium acetate (PHOSLO) 667 MG capsule Take 667 mg by mouth 3 (three) times daily with meals.    Yes Historical Provider, MD  gabapentin (NEURONTIN) 100 MG capsule Take 200 mg by mouth every 12 (twelve) hours.    Yes Historical Provider, MD  hydrALAZINE (APRESOLINE) 100 MG tablet Take 100 mg by mouth 2 (two) times daily. Sunday, Monday,wednesday,friday   Yes Historical Provider, MD    levETIRAcetam (KEPPRA) 500 MG tablet Take 500 mg by mouth 2 (two) times daily.   Yes Historical Provider, MD  levofloxacin (LEVAQUIN) 750 MG tablet Take 750 mg by mouth daily. For 7 days. Started on 11-27-15   Yes Historical Provider, MD  losartan (COZAAR) 100 MG tablet Take 100 mg by mouth every evening. 10/02/14  Yes Historical Provider, MD  Melatonin 3 MG TABS Take 3 mg by mouth at bedtime.   Yes Historical Provider, MD  ondansetron (ZOFRAN) 4 MG tablet Take 4 mg by mouth every 8 (eight) hours as needed for nausea or vomiting.   Yes Historical Provider, MD  oxyCODONE-acetaminophen (PERCOCET/ROXICET) 5-325 MG tablet Take 1 tablet by mouth every 4 (four) hours as needed for severe pain.  Yes Historical Provider, MD  polyethylene glycol (MIRALAX / GLYCOLAX) packet Take 17 g by mouth daily. 08/26/15  Yes Bonnell Public, MD  saccharomyces boulardii (FLORASTOR) 250 MG capsule Take 250 mg by mouth daily.   Yes Historical Provider, MD  senna-docusate (SENOKOT-S) 8.6-50 MG tablet Take 1 tablet by mouth 2 (two) times daily. For constipation 08/21/15  Yes Ripudeep K Rai, MD  sertraline (ZOLOFT) 25 MG tablet Take 25 mg by mouth daily.   Yes Historical Provider, MD  verapamil (CALAN) 120 MG tablet Take 1 tablet (120 mg total) by mouth every 8 (eight) hours. 08/20/15  Yes Ripudeep Krystal Eaton, MD  hydrALAZINE (APRESOLINE) 50 MG tablet Take 1 tablet (50 mg total) by mouth 3 (three) times daily. 08/21/15   Ripudeep Krystal Eaton, MD  HYDROcodone-acetaminophen (NORCO/VICODIN) 5-325 MG tablet Take 1 tablet by mouth every 6 (six) hours as needed for moderate pain.    Historical Provider, MD  ondansetron (ZOFRAN-ODT) 4 MG disintegrating tablet Take 4 mg by mouth every 8 (eight) hours as needed for nausea or vomiting.    Historical Provider, MD  sodium polystyrene (KAYEXALATE) 15 GM/60ML suspension Take 15 g by mouth once.    Historical Provider, MD    Family History Family History  Problem Relation Age of Onset  . Diabetes Mother    . Hypertension Mother   . Stroke Mother   . Kidney failure Mother   . Cancer Father     Social History Social History  Substance Use Topics  . Smoking status: Current Every Day Smoker    Packs/day: 1.50    Years: 45.00    Types: Cigarettes  . Smokeless tobacco: Never Used  . Alcohol use 14.4 oz/week    24 Cans of beer per week     Comment: 1 40's per day     Allergies   Reglan [metoclopramide]   Review of Systems Review of Systems 10/14 systems reviewed and are negative other than those stated in the HPI   Physical Exam Updated Vital Signs BP 181/75   Pulse 76   Temp 98.5 F (36.9 C) (Oral)   Resp 19   SpO2 100%   Physical Exam Physical Exam  Nursing note and vitals reviewed. Constitutional: Chronically ill appearing, non-toxic, and in no acute distress Head: Normocephalic and atraumatic.  Mouth/Throat: Oropharynx is clear and moist.  Neck: Normal range of motion. Neck supple.  Cardiovascular: Normal rate and regular rhythm.   Pulmonary/Chest: Effort normal and breath sounds normal.  Abdominal: Soft. There is no tenderness. There is no rebound and no guarding. Rectal exam with hematochezia. Musculoskeletal: right BKA, with surgical incision well approximated by staples. No significant erythema, swelling, induration, or any purulent drainage.  Neurological: Alert, no facial droop, fluent speech, moves all extremities symmetrically Skin: Skin is warm and dry.  Psychiatric: Cooperative   ED Treatments / Results  Labs (all labs ordered are listed, but only abnormal results are displayed) Labs Reviewed  COMPREHENSIVE METABOLIC PANEL - Abnormal; Notable for the following:       Result Value   Chloride 99 (*)    Glucose, Bld 59 (*)    BUN 21 (*)    Creatinine, Ser 5.25 (*)    Calcium 8.5 (*)    Total Protein 5.4 (*)    Albumin 1.9 (*)    ALT 10 (*)    GFR calc non Af Amer 11 (*)    GFR calc Af Amer 13 (*)    All other components  within normal limits   CBC WITH DIFFERENTIAL/PLATELET - Abnormal; Notable for the following:    RBC 0.85 (*)    Hemoglobin 2.3 (*)    HCT 7.7 (*)    MCHC 29.9 (*)    RDW 17.1 (*)    All other components within normal limits  PROTIME-INR - Abnormal; Notable for the following:    Prothrombin Time 15.6 (*)    All other components within normal limits  APTT - Abnormal; Notable for the following:    aPTT 43 (*)    All other components within normal limits  I-STAT CG4 LACTIC ACID, ED - Abnormal; Notable for the following:    Lactic Acid, Venous 0.39 (*)    All other components within normal limits  CULTURE, BLOOD (ROUTINE X 2)  CULTURE, BLOOD (ROUTINE X 2)  URINE CULTURE  URINALYSIS, ROUTINE W REFLEX MICROSCOPIC (NOT AT [facility])  I-STAT TROPOININ, ED  TYPE AND SCREEN  PREPARE RBC (CROSSMATCH)    EKG  EKG Interpretation None       Radiology Dg Chest Port 1 View  Result Date: 12/03/2015 CLINICAL DATA:  Fever and fatigue. EXAM: PORTABLE CHEST 1 VIEW COMPARISON:  08/16/2015 FINDINGS: 1514 hours. Lungs are hyperexpanded. The lungs are clear wiithout focal pneumonia, edema, pneumothorax or pleural effusion. The cardio pericardial silhouette is enlarged. The visualized bony structures of the thorax are intact. Degenerative changes are seen in the glenohumeral joints bilaterally. IMPRESSION: No active disease. Electronically Signed   By: Misty Stanley M.D.   On: 12/03/2015 15:28    Procedures Procedures (including critical care time)  Medications Ordered in ED Medications  0.9 %  sodium chloride infusion (not administered)  pantoprazole (PROTONIX) 80 mg in sodium chloride 0.9 % 100 mL IVPB (not administered)  piperacillin-tazobactam (ZOSYN) IVPB 3.375 g (3.375 g Intravenous New Bag/Given 12/03/15 1641)  vancomycin (VANCOCIN) IVPB 1000 mg/200 mL premix (1,000 mg Intravenous New Bag/Given 12/03/15 1641)      Initial Impression / Assessment and Plan / ED Course  I have reviewed the triage vital signs and  the nursing notes.  Pertinent labs & imaging results that were available during my care of the patient were reviewed by me and considered in my medical decision making (see chart for details).  Clinical Course   prsenting with GIB and fever. With active lower GI bleed here and episode of hematochezia. Hemoglobin of 2.3. Transfusing 4 units of blood, with 4 units on hold. Has been hemodynamically stable since he's been in the ED, and has been normotensive and not tachycardic. I does have an oxygen requirement likely from his severe anemia. Afebrile here, but has been having some clinical symptoms of pneumonia as well as fever at his facility of 102. Chest x-ray is clear, but will empirically cover him with antibiotics including vancomycin and Zosyn for possible hospital-acquired pneumonia. Blood cultures obtained prior to antibiotics. He has no significant leukocytosis or elevated lactate. I discussed with gastroenterology who will come to evaluate patient. Discussed with Dr. Carles Collet who will admit to stepdown.     CRITICAL CARE Performed by: Forde Dandy   Total critical care time: 45 minutes  Critical care time was exclusive of separately billable procedures and treating other patients.  Critical care was necessary to treat or prevent imminent or life-threatening deterioration.  Critical care was time spent personally by me on the following activities: development of treatment plan with patient and/or surrogate as well as nursing, discussions with consultants, evaluation of patient's response to treatment,  examination of patient, obtaining history from patient or surrogate, ordering and performing treatments and interventions, ordering and review of laboratory studies, ordering and review of radiographic studies, pulse oximetry and re-evaluation of patient's condition.   Final Clinical Impressions(s) / ED Diagnoses   Final diagnoses:  Lower GI bleed  Acute blood loss anemia    New  Prescriptions New Prescriptions   No medications on file     Forde Dandy, MD 12/03/15 1704

## 2015-12-03 NOTE — Consult Note (Signed)
[facility] Consult: 9:20 AM 12/04/2015  LOS: 1 day    Referring Provider: Dr Oleta Mouse in ED  Primary Care Physician:  No primary care provider on file. Primary Gastroenterologist:  Dr. Henrene Pastor     Reason for Consultation:  hematochezia   HPI: [name] is a 60 y.o. male.  Medical problems include end-stage renal disease on hemodialysis TTS, diabetes, hepatitis C, alcohol abuse, chronic anemia, medical noncompliance, polysubstance abuse active just prior to recent admissions.  Evaluations for chronic anemia in past including several upper endoscopies, enteroscopy, and colonoscopy .  Mild gastritis and adenomatous colon polyps.   EGD in 2011 (gastritis), and 2012 (small hiatal hernia). Colonoscopy in May of 2012 with small tubular adenoma. Push enteroscopy December 2012  was unremarkable.  Capsule endo in 08/2013 was unremarkable.  Hematology evaluations included bone marrow biopsy and questioned abnormal B cell population.  His anemia was felt to be multifactorial, but mostly on the basis of his renal disease.  Has not followed up with hematology for what sounds like possible bone marrow biopsy repeat.  Past hgbs as low as 3.8.  Transfused on many occasions. In past has gotten Aranesp at dialysis, awaiting renal input to verify if still getting epo and or iron infusions.  Admission at [facility] for right foot necrosis, sepsis 8/1-8/9.  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  Opioid induced resp failure during stay.  Looks to have received PRBCs during his stay as I see orders to transfuse on 4 separate days.  Also had leukopenia to 1.9.  Discharged to a SNF.  Do not see PPI on diischarge med list.  On 81 mg ASA.  No recent NSAIDs  Presents with acute onset hematochezia.  3 episodes at his rehab SNF.  Brought to ED at [facility].  Hgb 2.3.. 9.8 after PRBC  x 4.  Was 7.2 on 09/18/15, 8.6 on 8/9.   Coags, platelets ok Fevers to 102 of undetermined source.  Vanco/Cefepime in place. Hypertensive urgency in ED.  Pt deies n/v but has had anorexia.  However today he is hungry.  No dysphagia.  Denies recent use of cocaine, etoh, etc.  Has leg pain, no abdominal pain.  No nose bleeds    Past Medical History:  Diagnosis Date  . Anemia, chronic disease    a. Capsule endoscopy reportedly negative 08/2013. b. seen by heme with concern for minor B cell population on BMB, being observed.  . Arthritis    "right shoulder" (11/09/2012)  . Chronic diastolic CHF (congestive heart failure) ([place])    a. EF 60 - 65% per [place] echo 11/2011. b. Echo 02/2012: severe LVH, focal basal hypertrophy, EF 60-65%, mild Mr.  . Cocaine abuse    mentioned in notes from [place]  . ESRD (end stage renal disease) on dialysis [facility])    "TTS; [address]" (06/19/2015)  . ESRD on hemodialysis ([place]) since 2012   a. Since 2012. ESRD was due to "drugs", primarily used cocaine.  Has 3-5 year hx of HTN, no DM.  Gets HD on TTS schedule at [place]. Originally  from [place].   . GI bleed 05/04/2012  . Head injury, closed, with concussion ([place])   . Headache(784.0)   . Helicobacter pylori gastritis    not defined if this was treated  . Hematochezia   . Hepatitis B core antibody positive    03/01/10  . Hepatitis C antibody test positive    was HIV negative, 02/28/12  . History of blood transfusion   . Hypercalcemia   . Hyperpotassemia   . Hypertension   . Hypertensive heart disease   . Optic neuritis, left   . Polyp of colon, adenomatous    May 2012.  Dr Trenton Founds in [place]  . Positive QuantiFERON-TB Gold test    11/2011  . Protein-calorie malnutrition, severe ([place])   . Seizure disorder [facility]    questionable history of - will need to clarify with PCP  . Tobacco abuse     Past Surgical History:  Procedure Laterality Date  . [place] TRANSPOSITION  03/07/2012    Procedure: BASCILIC VEIN TRANSPOSITION;  Surgeon: Conrad Willoughby, MD;  Location: [place];  Service: Vascular;  Laterality: Left;  First Stage  . BASCILIC VEIN TRANSPOSITION Left 05/31/2012   Procedure: BASCILIC VEIN TRANSPOSITION;  Surgeon: Conrad Beverly Shores, MD;  Location: [place];  Service: Vascular;  Laterality: Left;  Left 2nd Stage Basilic Vein Transposition with gortex graft revision using 3mx10cm graft  . [name] CAPSULE STUDY N/A 08/27/2013   Procedure: [name] CAPSULE STUDY;  Surgeon: DMilus Banister MD;  [place]  Service: Endoscopy;  Laterality: N/A;  . INSERTION OF DIALYSIS CATHETER     right chest  . JOINT REPLACEMENT    . SHOULDER OPEN ROTATOR CUFF REPAIR Right   . TOTAL KNEE ARTHROPLASTY Left     Prior to Admission medications   Medication Sig Start Date End Date Taking? Authorizing Provider  amantadine (SYMMETREL) 100 MG capsule Take 100 mg by mouth 3 (three) times daily.   Yes Historical Provider, MD  aspirin EC 81 MG EC tablet Take 1 tablet (81 mg total) by mouth daily. 08/20/15  Yes Ripudeep KKrystal Eaton MD  atorvastatin (LIPITOR) 40 MG tablet Take 1 tablet (40 mg total) by mouth at bedtime. 08/20/15  Yes Ripudeep KKrystal Eaton MD  B Complex-C-Folic Acid (RENA-VITE PO) Take 1 tablet by mouth daily.   Yes Historical Provider, MD  calcium acetate (PHOSLO) 667 MG capsule Take 667 mg by mouth 3 (three) times daily with meals.    Yes Historical Provider, MD  gabapentin (NEURONTIN) 100 MG capsule Take 200 mg by mouth every 12 (twelve) hours.    Yes Historical Provider, MD  hydrALAZINE (APRESOLINE) 100 MG tablet Take 100 mg by mouth 2 (two) times daily. Sunday, Monday,wednesday,friday   Yes Historical Provider, MD  levETIRAcetam (KEPPRA) 500 MG tablet Take 500 mg by mouth 2 (two) times daily.   Yes Historical Provider, MD  levofloxacin (LEVAQUIN) 750 MG tablet Take 750 mg by mouth daily. For 7 days. Started on 11-27-15   Yes Historical Provider, MD  losartan (COZAAR) 100 MG tablet Take 100 mg by  mouth every evening. 10/02/14  Yes Historical Provider, MD  Melatonin 3 MG TABS Take 3 mg by mouth at bedtime.   Yes Historical Provider, MD  ondansetron (ZOFRAN) 4 MG tablet Take 4 mg by mouth every 8 (eight) hours as needed for nausea or vomiting.   Yes Historical Provider, MD  oxyCODONE-acetaminophen (PERCOCET/ROXICET) 5-325 MG tablet Take 1 tablet by mouth every 4 (four) hours as  needed for severe pain.   Yes Historical Provider, MD  polyethylene glycol (MIRALAX / GLYCOLAX) packet Take 17 g by mouth daily. 08/26/15  Yes Bonnell Public, MD  saccharomyces boulardii (FLORASTOR) 250 MG capsule Take 250 mg by mouth daily.   Yes Historical Provider, MD  senna-docusate (SENOKOT-S) 8.6-50 MG tablet Take 1 tablet by mouth 2 (two) times daily. For constipation 08/21/15  Yes Ripudeep K Rai, MD  sertraline (ZOLOFT) 25 MG tablet Take 25 mg by mouth daily.   Yes Historical Provider, MD  verapamil (CALAN) 120 MG tablet Take 1 tablet (120 mg total) by mouth every 8 (eight) hours. 08/20/15  Yes Ripudeep Krystal Eaton, MD  hydrALAZINE (APRESOLINE) 50 MG tablet Take 1 tablet (50 mg total) by mouth 3 (three) times daily. 08/21/15   Ripudeep Krystal Eaton, MD  HYDROcodone-acetaminophen (NORCO/VICODIN) 5-325 MG tablet Take 1 tablet by mouth every 6 (six) hours as needed for moderate pain.    Historical Provider, MD  ondansetron (ZOFRAN-ODT) 4 MG disintegrating tablet Take 4 mg by mouth every 8 (eight) hours as needed for nausea or vomiting.    Historical Provider, MD  sodium polystyrene (KAYEXALATE) 15 GM/60ML suspension Take 15 g by mouth once.    Historical Provider, MD    Scheduled Meds: . sodium chloride  10 mL/hr Intravenous Once  . atorvastatin  40 mg Oral QHS  . calcium acetate  667 mg Oral TID WC  . ceFEPime (MAXIPIME) IV  2 g Intravenous Q T,Th,Sat-1800  . gabapentin  200 mg Oral Q12H  . hydrALAZINE  100 mg Oral 2 times per day on Sun Mon Wed Fri  . levETIRAcetam  500 mg Oral BID  . losartan  100 mg Oral QPM  . Melatonin   3 mg Oral QHS  . multivitamin  1 tablet Oral QHS  . pantoprazole  40 mg Oral BID  . polyethylene glycol  17 g Oral Daily  . saccharomyces boulardii  250 mg Oral Daily  . senna-docusate  1 tablet Oral BID  . sertraline  25 mg Oral Daily  . sodium chloride flush  3 mL Intravenous Q12H  . vancomycin  750 mg Intravenous Q T,Th,Sa-HD  . verapamil  120 mg Oral Q8H   Infusions:   PRN Meds:    Allergies as of 12/03/2015 - Review Complete 12/03/2015  Allergen Reaction Noted  . Reglan [metoclopramide] Other (See Comments) 03/02/2012    Family History  Problem Relation Age of Onset  . Diabetes Mother   . Hypertension Mother   . Stroke Mother   . Kidney failure Mother   . Cancer Father     Social History   Social History  . Marital status: Single    Spouse name: N/A  . Number of children: 1  . Years of education: N/A   Occupational History  . Unemployed    Social History Main Topics  . Smoking status: Current Every Day Smoker    Packs/day: 1.50    Years: 45.00    Types: Cigarettes  . Smokeless tobacco: Never Used  . Alcohol use 14.4 oz/week    24 Cans of beer per week     Comment: 1 40's per day  . Drug use:     Types: "Crack" cocaine, Cocaine, Marijuana     Comment: 06/19/2015 "still use but don't use drugs very much"  . Sexual activity: Yes    Birth control/ protection: None   Other Topics Concern  . Not on file   Social  History Narrative  . No narrative on file    REVIEW OF SYSTEMS: Constitutional:  Weakness.  No recent falls ENT:  No nose bleeds Pulm:  Chronic cough, clear sputum CV:  No palpitations, no LE edema.  GU:  anuric GI:  Per HPI Heme:  No unusual bleeding or bruising   Transfusions:  4 overnight and in past years Neuro:  No headaches, no peripheral tingling or numbness Derm:  No itching, no rash or sores.  Endocrine:  No sweats or chills. Immunization:  Not queried Travel:  None beyond local counties in last few months.    PHYSICAL  EXAM: Vital signs in last 24 hours: Vitals:   12/04/15 0534 12/04/15 0821  BP:  (!) 145/82  Pulse:  63  Resp:  17  Temp: 98.4 F (36.9 C) 98.8 F (37.1 C)   Wt Readings from Last 3 Encounters:  12/04/15 60.8 kg (134 lb 0.6 oz)  09/18/15 64.1 kg (141 lb 5 oz)  08/26/15 60.4 kg (133 lb 2.5 oz)    General: thin, chronically ill looking.  Letargic but arouseable Head:  No swelling or asymmetry.  Eyes:  No icterus.  + conj pallor Ears:  Not HOH  Nose:  No congestion or discharge Mouth:  Moist, pale oral mucosa.  No teeth, no lesions.  Tongue midline Neck:  No mass Lungs:  Clear bil.  Reduced BS.  + wet cough Heart: RRR.  No mrg.  S1/S2 present Abdomen:  Soft, NT, ND.  BS active.  No mass.  No HSM.  No bruits.   Rectal: deepmaroon red stool.  Enlarged prostate, no masses   Musc/Skeltl: no joint swelling or redness Extremities:  S/p right AKA  Neurologic:  Oriented to place, self, time.  Moves all 4s.  No tremor.  Lethargic but easily aroused Skin:  No sores or rash.  Dry.   Psych:  Cooperative.  Distant affect.   Intake/Output from previous day: 08/16 0701 - 08/17 0700 In: 1577.5 [P.O.:240; Blood:1337.5] Out: -  Intake/Output this shift: No intake/output data recorded.  LAB RESULTS:  Recent Labs  12/03/15 1450 12/04/15 0737  WBC 6.0 6.6  HGB 2.3* 9.3*  HCT 7.7* 29.1*  PLT 258 183   BMET Lab Results  Component Value Date   NA 136 12/04/2015   NA 137 12/03/2015   NA 135 09/18/2015   K 4.3 12/04/2015   K 4.3 12/03/2015   K 4.1 09/18/2015   CL 98 (L) 12/04/2015   CL 99 (L) 12/03/2015   CL 95 (L) 09/18/2015   CO2 28 12/04/2015   CO2 31 12/03/2015   CO2 29 09/18/2015   GLUCOSE 50 (L) 12/04/2015   GLUCOSE 59 (L) 12/03/2015   GLUCOSE 72 09/18/2015   BUN 24 (H) 12/04/2015   BUN 21 (H) 12/03/2015   BUN 35 (H) 09/18/2015   CREATININE 5.94 (H) 12/04/2015   CREATININE 5.25 (H) 12/03/2015   CREATININE 4.76 (H) 09/18/2015   CALCIUM 8.6 (L) 12/04/2015   CALCIUM  8.5 (L) 12/03/2015   CALCIUM 8.5 (L) 09/18/2015   LFT  Recent Labs  12/03/15 1450 12/04/15 0737  PROT 5.4* 5.6*  ALBUMIN 1.9* 2.0*  AST 18 19  ALT 10* 10*  ALKPHOS 62 59  BILITOT 0.7 1.2   PT/INR Lab Results  Component Value Date   INR 1.23 12/03/2015   INR 0.96 08/16/2015   INR 1.26 04/12/2015   Hepatitis Panel No results for input(s): HEPBSAG, HCVAB, HEPAIGM, HEPBIGM in the last 72 hours.  C-Diff No components found for: CDIFF Lipase     Component Value Date/Time   LIPASE 61 (H) 05/05/2012 0147    Drugs of Abuse     Component Value Date/Time   LABOPIA POSITIVE (A) 02/16/2014 0534   COCAINSCRNUR NONE DETECTED 02/16/2014 0534   LABBENZ NONE DETECTED 02/16/2014 0534   AMPHETMU NONE DETECTED 02/16/2014 0534   THCU NONE DETECTED 02/16/2014 0534   LABBARB NONE DETECTED 02/16/2014 0534     RADIOLOGY STUDIES: Dg Chest Port 1 View  Result Date: 12/03/2015 CLINICAL DATA:  Fever and fatigue. EXAM: PORTABLE CHEST 1 VIEW COMPARISON:  08/16/2015 FINDINGS: 1514 hours. Lungs are hyperexpanded. The lungs are clear wiithout focal pneumonia, edema, pneumothorax or pleural effusion. The cardio pericardial silhouette is enlarged. The visualized bony structures of the thorax are intact. Degenerative changes are seen in the glenohumeral joints bilaterally. IMPRESSION: No active disease. Electronically Signed   By: Misty Stanley M.D.   On: 12/03/2015 15:28    ENDOSCOPIC STUDIES: Per HPI  IMPRESSION:   *  GI bleed with painless hematochezia.  Stool burgundy red on rectal today.   ? Lower vs upper GI bleed.   Extensive previous endoscopic studies for overt and occult GI bleeding: HH, gastritis, adenomatous colon polyp.     *  Acute on chronic anemia. ? Of abnormal B cell population on previous bone marrow biopsy. S/p PRBC x 4.   *  Hep C +.  In substance abuser, cocaine.    *  ESRD.    *  Fever.  Normal WBCs but had leukopenia during recent admission in [facility].  *  S/p  8/3 BKA for infected foot.    PLAN:     *  Dr Loletha Carrow to figure out what studies pt should have.  Continue oral Protonix for now.  Note that diet orders for clears placed.    Azucena Freed  12/04/2015, 9:20 AM Pager: [phone]    I have reviewed the entire case in detail with the above APP and discussed the plan in detail.  Therefore, I agree with the diagnoses recorded above. In addition,  I have personally interviewed and examined the patient and have personally reviewed any abdominal/pelvic CT scan images.  My additional thoughts are as follows:  This is a very complicated patient with a long history of multifactorial anemia.  There is now acute GI bleeding of unclear source (?upper or lower) with maroon blood after recent foot infection requiring BKA. His Hgb rose nicely after transfusion, and he is hemodynamically stable. [name] going for HD today.  However, he is somnolent and confused. He had a fever to 102F upon admission, no clear source at this time.  I inspected his right BKA stump, and the wound is closed and healthy appearing.  There is also no toxicology screen in a patient with demonstrated illicit drug use and medical noncompliance.   He needs an EGD and colonoscopy to possibly localize the source of bleeding.  However, he is not capable of giving informed consent at this time.  As long as he remains stable, the procedures will have to wait until his mental status clears and proper informed consent can be given.  Transfuse PRBCs as needed, and we will follow.     Nelida Meuse III Pager [phone]  Mon-Fri 8a-5p [phone] after 5p, weekends, holidays

## 2015-12-03 NOTE — ED Triage Notes (Signed)
Pt presents s/o R BKA  X 1 week; noted at [facility] to have black tarry stools today.  Pt reports h/o same.  Pt also febrile at 102 reported today; reports HGB of 5.9 today.  Pt has altered mental status; is on HD with last treatment yesterday.

## 2015-12-03 NOTE — H&P (Signed)
History and Physical  [name] [id number] DOB: 12-Oct-1955 DOA: 12/03/2015   PCP: No primary care provider on file.  Referring Physician: ED/ Dr. Oleta Mouse   Patient coming from: [facility]  Chief Complaint: rectal bleed  HPI:  [name] is a 60 y.o. male with medical history of ESRD, hypertension, seizure disorder, diastolic CHF, polysubstance abuse including cocaine and tobacco presented with 3 episodes of hematochezia at his nursing facility on 12/03/2015.  Notably, the patient was recently discharged from [facility] after a stay from 8/1-8/9 for sepsis secondary to necrotic right foot. 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 During the hospitalization, the patient also developed respiratory failure secondary to opioid therapy and improved with naloxone. In addition, the patient has had several upper endoscopies, enteroscopy, and colonoscopy. Most recently, the patient had an EGD in 2012 showing small hiatus hernia and colonoscopy in May 2012 with small tubular and normal. Enteroscopy in December 2012 was unremarkable. Capsule endoscopy in May 2015 was unremarkable.  Patient denies fevers, chills, headache, chest pain, dyspnea, nausea, vomiting, diarrhea, abdominal pain, dysuria, hematuria, hematochezia, and melena. In the emergency department, the patient was noted to have temperature 99.30F with blood pressure 181/75 with oxygen saturation 100 percent on 2 L nasal cannula. Hemoglobin was noted to be 2.3 in ED. On 11/26/2015, the patient's hemoglobin was 8.6 at [facility].  The patient was started on intravenous Protonix. The patient was also given a dose of vancomycin and Zosyn due to a noted history of temperature up to 102F at his skilled nursing facility. Blood cultures were obtained in the emergency department, and 4 units PRBCs were ordered.  Assessment/Plan: Acute blood loss anemia -Secondary to likely lower GI bleed -[place] [facility] has been  consulted -transfuse 4 units PRBC -INR 1.23 -PTT 43 -protonix bid -clear liquid diet for now  Hematochezia -per GI -admit to stepdown -hold ASA  Fever -rectal temp on 99.9 in ED;  EDP reports temp of 102 at SNF -CXR--no infiltrates -pt is anuric -Blood cultures x 2 -empiric vanc and cefepime pending cultures -R-stump does not look infected on exam  HTN urgency -Continue hydralazine, losartan, verapamil -hydralazine prn BP > 180  ESRD -Nephrology has been consulted for maintenance dialysis  Polysubstance abuse -pt claims he smoked cocaine 2 days prior to admission -continues to smoke tobacco -defers nicotine patch  Seizure disorder -continue Keppra  Hyperlipidemia -continue statin      Past Medical History:  Diagnosis Date  . Anemia, chronic disease    a. Capsule endoscopy reportedly negative 08/2013. b. seen by heme with concern for minor B cell population on BMB, being observed.  . Arthritis    "right shoulder" (11/09/2012)  . Chronic diastolic CHF (congestive heart failure) ([place])    a. EF 60 - 65% per [place] echo 11/2011. b. Echo 02/2012: severe LVH, focal basal hypertrophy, EF 60-65%, mild Mr.  . Cocaine abuse    mentioned in notes from [place]  . ESRD (end stage renal disease) on dialysis [facility])    "TTS; [address]" (06/19/2015)  . ESRD on hemodialysis ([place]) since 2012   a. Since 2012. ESRD was due to "drugs", primarily used cocaine.  Has 3-5 year hx of HTN, no DM.  Gets HD on TTS schedule at [facility]. Originally from [place].   . GI bleed 05/04/2012  . Head injury, closed, with concussion ([place])   . Headache(784.0)   . Helicobacter pylori gastritis    not  defined if this was treated  . Hematochezia   . Hepatitis B core antibody positive    03/01/10  . Hepatitis C antibody test positive    was HIV negative, 02/28/12  . History of blood transfusion   . Hypercalcemia   . Hyperpotassemia   . Hypertension   . Hypertensive heart disease   .  Optic neuritis, left   . Polyp of colon, adenomatous    May 2012.  Dr Trenton Founds in [place]  . Positive QuantiFERON-TB Gold test    11/2011  . Protein-calorie malnutrition, severe ([place])   . Seizure disorder [facility])    questionable history of - will need to clarify with PCP  . Tobacco abuse    Past Surgical History:  Procedure Laterality Date  . [place] TRANSPOSITION  03/07/2012   Procedure: BASCILIC VEIN TRANSPOSITION;  Surgeon: Conrad Bernie, MD;  Location: [place];  Service: Vascular;  Laterality: Left;  First Stage  . BASCILIC VEIN TRANSPOSITION Left 05/31/2012   Procedure: BASCILIC VEIN TRANSPOSITION;  Surgeon: Conrad Taholah, MD;  Location: [place];  Service: Vascular;  Laterality: Left;  Left 2nd Stage Basilic Vein Transposition with gortex graft revision using 64mmx10cm graft  . [name] CAPSULE STUDY N/A 08/27/2013   Procedure: [name] CAPSULE STUDY;  Surgeon: Milus Banister, MD;  Location: [place];  Service: Endoscopy;  Laterality: N/A;  . INSERTION OF DIALYSIS CATHETER     right chest  . JOINT REPLACEMENT    . SHOULDER OPEN ROTATOR CUFF REPAIR Right   . SHOULDER OPEN ROTATOR CUFF REPAIR Right   . TOTAL KNEE ARTHROPLASTY Left    Social History:  reports that he has been smoking Cigarettes.  He has a 67.50 pack-year smoking history. He has never used smokeless tobacco. He reports that he drinks about 14.4 oz of alcohol per week . He reports that he uses drugs, including "Crack" cocaine, Cocaine, and Marijuana.   Family History  Problem Relation Age of Onset  . Diabetes Mother   . Hypertension Mother   . Stroke Mother   . Kidney failure Mother   . Cancer Father      Allergies  Allergen Reactions  . Reglan [Metoclopramide] Other (See Comments)    Tardive dyskinesia in 11/2011 in [place]     Prior to Admission medications   Medication Sig Start Date End Date Taking? Authorizing Provider  amantadine (SYMMETREL) 100 MG capsule Take 100 mg by mouth 3 (three) times  daily.   Yes Historical Provider, MD  aspirin EC 81 MG EC tablet Take 1 tablet (81 mg total) by mouth daily. 08/20/15  Yes Ripudeep Krystal Eaton, MD  atorvastatin (LIPITOR) 40 MG tablet Take 1 tablet (40 mg total) by mouth at bedtime. 08/20/15  Yes Ripudeep Krystal Eaton, MD  B Complex-C-Folic Acid (RENA-VITE PO) Take 1 tablet by mouth daily.   Yes Historical Provider, MD  calcium acetate (PHOSLO) 667 MG capsule Take 667 mg by mouth 3 (three) times daily with meals.    Yes Historical Provider, MD  gabapentin (NEURONTIN) 100 MG capsule Take 200 mg by mouth every 12 (twelve) hours.    Yes Historical Provider, MD  hydrALAZINE (APRESOLINE) 100 MG tablet Take 100 mg by mouth 2 (two) times daily. Sunday, Monday,wednesday,friday   Yes Historical Provider, MD  levETIRAcetam (KEPPRA) 500 MG tablet Take 500 mg by mouth 2 (two) times daily.   Yes Historical Provider, MD  levofloxacin (LEVAQUIN) 750 MG tablet Take 750 mg by mouth daily. For 7  days. Started on 11-27-15   Yes Historical Provider, MD  losartan (COZAAR) 100 MG tablet Take 100 mg by mouth every evening. 10/02/14  Yes Historical Provider, MD  Melatonin 3 MG TABS Take 3 mg by mouth at bedtime.   Yes Historical Provider, MD  ondansetron (ZOFRAN) 4 MG tablet Take 4 mg by mouth every 8 (eight) hours as needed for nausea or vomiting.   Yes Historical Provider, MD  oxyCODONE-acetaminophen (PERCOCET/ROXICET) 5-325 MG tablet Take 1 tablet by mouth every 4 (four) hours as needed for severe pain.   Yes Historical Provider, MD  polyethylene glycol (MIRALAX / GLYCOLAX) packet Take 17 g by mouth daily. 08/26/15  Yes Bonnell Public, MD  saccharomyces boulardii (FLORASTOR) 250 MG capsule Take 250 mg by mouth daily.   Yes Historical Provider, MD  senna-docusate (SENOKOT-S) 8.6-50 MG tablet Take 1 tablet by mouth 2 (two) times daily. For constipation 08/21/15  Yes Ripudeep K Rai, MD  sertraline (ZOLOFT) 25 MG tablet Take 25 mg by mouth daily.   Yes Historical Provider, MD  verapamil  (CALAN) 120 MG tablet Take 1 tablet (120 mg total) by mouth every 8 (eight) hours. 08/20/15  Yes Ripudeep Krystal Eaton, MD  hydrALAZINE (APRESOLINE) 50 MG tablet Take 1 tablet (50 mg total) by mouth 3 (three) times daily. 08/21/15   Ripudeep Krystal Eaton, MD  HYDROcodone-acetaminophen (NORCO/VICODIN) 5-325 MG tablet Take 1 tablet by mouth every 6 (six) hours as needed for moderate pain.    Historical Provider, MD  ondansetron (ZOFRAN-ODT) 4 MG disintegrating tablet Take 4 mg by mouth every 8 (eight) hours as needed for nausea or vomiting.    Historical Provider, MD  sodium polystyrene (KAYEXALATE) 15 GM/60ML suspension Take 15 g by mouth once.    Historical Provider, MD    Review of Systems:  Constitutional:  No weight loss, night sweats Head&Eyes: No headache.  No vision loss.  No eye pain or scotoma ENT:  No Difficulty swallowing,Tooth/dental problems,Sore throat,  No ear ache, post nasal drip,  Cardio-vascular:  No chest pain, Orthopnea, PND, swelling in lower extremities,  Palpitations;  He complains of dizziness GI:  No  abdominal pain, nausea, vomiting, diarrhea, loss of appetite, hematochezia, melena, heartburn, indigestion, Resp:  No shortness of breath with exertion or at rest. No cough. No coughing up of blood .No wheezing.No chest wall deformity  Skin:  no rash or lesions.  GU:  no dysuria, change in color of urine, no urgency or frequency. No flank pain.  Musculoskeletal:  No joint pain or swelling. No decreased range of motion. No back pain.  Psych:  No change in mood or affect. No depression or anxiety. Neurologic: No headache, no dysesthesia, no focal weakness, no vision loss. No syncope  Physical Exam: Vitals:   12/03/15 1642 12/03/15 1645 12/03/15 1700 12/03/15 1730  BP: 181/75 181/75 (!) 201/70 (!) 201/77  Pulse: 76 75 77 77  Resp: 19 15 17 17   Temp: 98.5 F (36.9 C)     TempSrc: Oral     SpO2: 100% 100% 100% 100%   General:  A&O x 2, NAD, nontoxic,  pleasant/cooperative Head/Eye: No conjunctival hemorrhage, no icterus, [place]/AT, No nystagmus ENT:  No icterus,  No thrush, good dentition, no pharyngeal exudate Neck:  No masses, no lymphadenpathy, no bruits CV:  RRR, no rub, no gallop, no S3 Lung:   bibasilar rales. No wheezing. Good air movement Abdomen: soft/NT, +BS, nondistended, no peritoneal signs Ext: No cyanosis, No rashes, No petechiae, No lymphangitis,  No edema; right BKA site without any erythema or drainage or bleeding Neuro: CNII-XII intact, strength 4/5 in bilateral upper and lower extremities, no dysmetria  Labs on Admission:  Basic Metabolic Panel:  Recent Labs Lab 12/03/15 1450  NA 137  K 4.3  CL 99*  CO2 31  GLUCOSE 59*  BUN 21*  CREATININE 5.25*  CALCIUM 8.5*   Liver Function Tests:  Recent Labs Lab 12/03/15 1450  AST 18  ALT 10*  ALKPHOS 62  BILITOT 0.7  PROT 5.4*  ALBUMIN 1.9*   No results for input(s): LIPASE, AMYLASE in the last 168 hours. No results for input(s): AMMONIA in the last 168 hours. CBC:  Recent Labs Lab 12/03/15 1450  WBC 6.0  NEUTROABS 4.1  HGB 2.3*  HCT 7.7*  MCV 90.6  PLT 258   Coagulation Profile:  Recent Labs Lab 12/03/15 1450  INR 1.23   Cardiac Enzymes: No results for input(s): CKTOTAL, CKMB, CKMBINDEX, TROPONINI in the last 168 hours. BNP: Invalid input(s): POCBNP CBG: No results for input(s): GLUCAP in the last 168 hours. Urine analysis:    Component Value Date/Time   COLORURINE YELLOW 11/07/2012 1903   APPEARANCEUR CLEAR 11/07/2012 1903   LABSPEC 1.012 11/07/2012 1903   PHURINE 8.5 (H) 11/07/2012 1903   GLUCOSEU 250 (A) 11/07/2012 1903   HGBUR NEGATIVE 11/07/2012 1903   BILIRUBINUR NEGATIVE 11/07/2012 1903   KETONESUR NEGATIVE 11/07/2012 1903   PROTEINUR >300 (A) 11/07/2012 1903   UROBILINOGEN 0.2 11/07/2012 1903   NITRITE NEGATIVE 11/07/2012 1903   LEUKOCYTESUR NEGATIVE 11/07/2012 1903   Sepsis  Labs: @LABRCNTIP (procalcitonin:4,lacticidven:4) )No results found for this or any previous visit (from the past 240 hour(s)).   Radiological Exams on Admission: Dg Chest Port 1 View  Result Date: 12/03/2015 CLINICAL DATA:  Fever and fatigue. EXAM: PORTABLE CHEST 1 VIEW COMPARISON:  08/16/2015 FINDINGS: 1514 hours. Lungs are hyperexpanded. The lungs are clear wiithout focal pneumonia, edema, pneumothorax or pleural effusion. The cardio pericardial silhouette is enlarged. The visualized bony structures of the thorax are intact. Degenerative changes are seen in the glenohumeral joints bilaterally. IMPRESSION: No active disease. Electronically Signed   By: Misty Stanley M.D.   On: 12/03/2015 15:28        Time spent:60 minutes Code Status:   FULL Family Communication:  No Family at bedside Disposition Plan: expect 2-3day hospitalization Consults called: [facility], Nephrology DVT Prophylaxis:  SCDs  [name] Nored, DO  [facility] Pager [phone]  If 7PM-7AM, please contact night-coverage [email or web address] Password [facility] 12/03/2015, 5:33 PM

## 2015-12-03 NOTE — Progress Notes (Signed)
Aware of pt's arrival in ed with hematochezia.  Hgb is 2.3 BPs of 180s/70s/80s.  Consult started.  Several GI procedures for blood loss and anemia in past.  Has anemia of CKD.    Needs to receive the 4 units of blood that has been ordered.   No need of drip protonix,  Either po or BID IV protonix is sufficient.  Will plan to complete evaluation in AM.  Dr Loletha Carrow is aware of pt.   Pt safe to have clear diet  Azucena Freed PAc-

## 2015-12-04 ENCOUNTER — Encounter (HOSPITAL_COMMUNITY): Payer: Self-pay | Admitting: Physician Assistant

## 2015-12-04 DIAGNOSIS — N189 Chronic kidney disease, unspecified: Secondary | ICD-10-CM

## 2015-12-04 DIAGNOSIS — D62 Acute posthemorrhagic anemia: Secondary | ICD-10-CM

## 2015-12-04 DIAGNOSIS — K921 Melena: Principal | ICD-10-CM

## 2015-12-04 DIAGNOSIS — D631 Anemia in chronic kidney disease: Secondary | ICD-10-CM

## 2015-12-04 LAB — COMPREHENSIVE METABOLIC PANEL
ALBUMIN: 2 g/dL — AB (ref 3.5–5.0)
ALK PHOS: 59 U/L (ref 38–126)
ALT: 10 U/L — ABNORMAL LOW (ref 17–63)
ANION GAP: 10 (ref 5–15)
AST: 19 U/L (ref 15–41)
BILIRUBIN TOTAL: 1.2 mg/dL (ref 0.3–1.2)
BUN: 24 mg/dL — ABNORMAL HIGH (ref 6–20)
CALCIUM: 8.6 mg/dL — AB (ref 8.9–10.3)
CO2: 28 mmol/L (ref 22–32)
Chloride: 98 mmol/L — ABNORMAL LOW (ref 101–111)
Creatinine, Ser: 5.94 mg/dL — ABNORMAL HIGH (ref 0.61–1.24)
GFR, EST AFRICAN AMERICAN: 11 mL/min — AB (ref >60)
GFR, EST NON AFRICAN AMERICAN: 9 mL/min — AB (ref >60)
Glucose, Bld: 50 mg/dL — ABNORMAL LOW (ref 65–99)
POTASSIUM: 4.3 mmol/L (ref 3.5–5.1)
Sodium: 136 mmol/L (ref 135–145)
Total Protein: 5.6 g/dL — ABNORMAL LOW (ref 6.5–8.1)

## 2015-12-04 LAB — CBC WITH DIFFERENTIAL/PLATELET
BASOS PCT: 1 %
Basophils Absolute: 0 10*3/uL (ref 0.0–0.1)
EOS ABS: 0.2 10*3/uL (ref 0.0–0.7)
EOS PCT: 3 %
HCT: 7.7 % — ABNORMAL LOW (ref 39.0–52.0)
HEMOGLOBIN: 2.3 g/dL — AB (ref 13.0–17.0)
LYMPHS ABS: 1.1 10*3/uL (ref 0.7–4.0)
Lymphocytes Relative: 19 %
MCH: 27.1 pg (ref 26.0–34.0)
MCHC: 29.9 g/dL — AB (ref 30.0–36.0)
MCV: 90.6 fL (ref 78.0–100.0)
MONO ABS: 0.7 10*3/uL (ref 0.1–1.0)
MONOS PCT: 11 %
Neutro Abs: 4.1 10*3/uL (ref 1.7–7.7)
Neutrophils Relative %: 67 %
PLATELETS: 258 10*3/uL (ref 150–400)
RBC: <1 MIL/uL — ABNORMAL LOW (ref 4.22–5.81)
RDW: 17.1 % — AB (ref 11.5–15.5)
WBC: 6 10*3/uL (ref 4.0–10.5)

## 2015-12-04 LAB — CBC
HEMATOCRIT: 29.1 % — AB (ref 39.0–52.0)
HEMOGLOBIN: 9.3 g/dL — AB (ref 13.0–17.0)
MCH: 28.2 pg (ref 26.0–34.0)
MCHC: 32 g/dL (ref 30.0–36.0)
MCV: 88.2 fL (ref 78.0–100.0)
Platelets: 183 10*3/uL (ref 150–400)
RBC: 3.3 MIL/uL — ABNORMAL LOW (ref 4.22–5.81)
RDW: 16.7 % — AB (ref 11.5–15.5)
WBC: 6.6 10*3/uL (ref 4.0–10.5)

## 2015-12-04 LAB — GLUCOSE, CAPILLARY
GLUCOSE-CAPILLARY: 128 mg/dL — AB (ref 65–99)
Glucose-Capillary: 59 mg/dL — ABNORMAL LOW (ref 65–99)

## 2015-12-04 LAB — MRSA PCR SCREENING: MRSA by PCR: NEGATIVE

## 2015-12-04 MED ORDER — LEVETIRACETAM 500 MG PO TABS
500.0000 mg | ORAL_TABLET | Freq: Two times a day (BID) | ORAL | Status: DC
  Administered 2015-12-04 (×3): 500 mg via ORAL
  Filled 2015-12-04 (×3): qty 1

## 2015-12-04 MED ORDER — HYDROCODONE-ACETAMINOPHEN 5-325 MG PO TABS
1.0000 | ORAL_TABLET | Freq: Once | ORAL | Status: AC
  Administered 2015-12-04: 1 via ORAL
  Filled 2015-12-04: qty 1

## 2015-12-04 MED ORDER — CEFEPIME HCL 2 G IJ SOLR
2.0000 g | INTRAMUSCULAR | Status: DC
  Administered 2015-12-04: 2 g via INTRAVENOUS
  Filled 2015-12-04 (×2): qty 2

## 2015-12-04 MED ORDER — LOSARTAN POTASSIUM 50 MG PO TABS
100.0000 mg | ORAL_TABLET | Freq: Every evening | ORAL | Status: DC
  Administered 2015-12-04 – 2015-12-08 (×5): 100 mg via ORAL
  Filled 2015-12-04 (×6): qty 2

## 2015-12-04 MED ORDER — HYDRALAZINE HCL 20 MG/ML IJ SOLN
10.0000 mg | Freq: Once | INTRAMUSCULAR | Status: AC
  Administered 2015-12-04: 10 mg via INTRAVENOUS

## 2015-12-04 MED ORDER — HYDRALAZINE HCL 20 MG/ML IJ SOLN
10.0000 mg | Freq: Four times a day (QID) | INTRAMUSCULAR | Status: DC | PRN
  Administered 2015-12-04 – 2015-12-08 (×7): 10 mg via INTRAVENOUS
  Filled 2015-12-04 (×8): qty 1

## 2015-12-04 MED ORDER — VANCOMYCIN HCL IN DEXTROSE 750-5 MG/150ML-% IV SOLN
750.0000 mg | INTRAVENOUS | Status: DC
  Administered 2015-12-04 – 2015-12-06 (×2): 750 mg via INTRAVENOUS
  Filled 2015-12-04 (×2): qty 150

## 2015-12-04 MED ORDER — SODIUM CHLORIDE 0.9 % IV SOLN
500.0000 mg | Freq: Once | INTRAVENOUS | Status: AC
  Administered 2015-12-04: 500 mg via INTRAVENOUS
  Filled 2015-12-04: qty 500

## 2015-12-04 MED ORDER — ATORVASTATIN CALCIUM 40 MG PO TABS
40.0000 mg | ORAL_TABLET | Freq: Every day | ORAL | Status: DC
  Administered 2015-12-04 – 2015-12-08 (×6): 40 mg via ORAL
  Filled 2015-12-04 (×6): qty 1

## 2015-12-04 MED ORDER — DEXTROSE 50 % IV SOLN
INTRAVENOUS | Status: AC
  Filled 2015-12-04: qty 50

## 2015-12-04 MED ORDER — CEFEPIME HCL 2 G IJ SOLR
2.0000 g | Freq: Once | INTRAMUSCULAR | Status: AC
  Administered 2015-12-04: 2 g via INTRAVENOUS
  Filled 2015-12-04: qty 2

## 2015-12-04 MED ORDER — HYDRALAZINE HCL 20 MG/ML IJ SOLN
INTRAMUSCULAR | Status: AC
  Administered 2015-12-04: 10 mg via INTRAVENOUS
  Filled 2015-12-04: qty 1

## 2015-12-04 MED ORDER — MELATONIN 3 MG PO TABS
3.0000 mg | ORAL_TABLET | Freq: Every day | ORAL | Status: DC
  Administered 2015-12-04: 3 mg via ORAL
  Filled 2015-12-04 (×2): qty 1

## 2015-12-04 MED ORDER — ACETAMINOPHEN 650 MG RE SUPP: 650.0000 mg | Freq: Four times a day (QID) | RECTAL | Status: DC | PRN

## 2015-12-04 MED ORDER — DARBEPOETIN ALFA 150 MCG/0.3ML IJ SOSY
PREFILLED_SYRINGE | INTRAMUSCULAR | Status: AC
  Administered 2015-12-04: 150 ug via INTRAVENOUS
  Filled 2015-12-04: qty 0.3

## 2015-12-04 MED ORDER — RENA-VITE PO TABS
1.0000 | ORAL_TABLET | Freq: Every day | ORAL | Status: DC
  Administered 2015-12-04 – 2015-12-08 (×6): 1 via ORAL
  Filled 2015-12-04 (×6): qty 1

## 2015-12-04 MED ORDER — PANTOPRAZOLE SODIUM 40 MG PO TBEC
40.0000 mg | DELAYED_RELEASE_TABLET | Freq: Two times a day (BID) | ORAL | Status: DC
  Administered 2015-12-04 – 2015-12-08 (×10): 40 mg via ORAL
  Filled 2015-12-04 (×11): qty 1

## 2015-12-04 MED ORDER — DEXTROSE 50 % IV SOLN
50.0000 mL | Freq: Once | INTRAVENOUS | Status: AC
  Administered 2015-12-04: 50 mL via INTRAVENOUS

## 2015-12-04 MED ORDER — HYDRALAZINE HCL 50 MG PO TABS
100.0000 mg | ORAL_TABLET | ORAL | Status: DC
  Administered 2015-12-04: 100 mg via ORAL
  Filled 2015-12-04 (×2): qty 2

## 2015-12-04 MED ORDER — SODIUM CHLORIDE 0.9% FLUSH
3.0000 mL | Freq: Two times a day (BID) | INTRAVENOUS | Status: DC
  Administered 2015-12-04 – 2015-12-08 (×9): 3 mL via INTRAVENOUS

## 2015-12-04 MED ORDER — SENNOSIDES-DOCUSATE SODIUM 8.6-50 MG PO TABS
1.0000 | ORAL_TABLET | Freq: Two times a day (BID) | ORAL | Status: DC
  Administered 2015-12-04 – 2015-12-08 (×7): 1 via ORAL
  Filled 2015-12-04 (×8): qty 1

## 2015-12-04 MED ORDER — LABETALOL HCL 100 MG PO TABS
100.0000 mg | ORAL_TABLET | Freq: Two times a day (BID) | ORAL | Status: DC
  Administered 2015-12-04: 100 mg via ORAL
  Filled 2015-12-04: qty 1

## 2015-12-04 MED ORDER — ACETAMINOPHEN 325 MG PO TABS
650.0000 mg | ORAL_TABLET | Freq: Four times a day (QID) | ORAL | Status: DC | PRN
  Administered 2015-12-04 – 2015-12-08 (×2): 650 mg via ORAL
  Filled 2015-12-04: qty 2

## 2015-12-04 MED ORDER — GABAPENTIN 100 MG PO CAPS
200.0000 mg | ORAL_CAPSULE | Freq: Two times a day (BID) | ORAL | Status: DC
  Administered 2015-12-04 (×3): 200 mg via ORAL
  Filled 2015-12-04 (×3): qty 2

## 2015-12-04 MED ORDER — VERAPAMIL HCL 120 MG PO TABS
120.0000 mg | ORAL_TABLET | Freq: Three times a day (TID) | ORAL | Status: DC
  Administered 2015-12-04 – 2015-12-08 (×12): 120 mg via ORAL
  Filled 2015-12-04 (×17): qty 1

## 2015-12-04 MED ORDER — ONDANSETRON HCL 4 MG PO TABS: 4.0000 mg | ORAL_TABLET | Freq: Four times a day (QID) | ORAL | Status: DC | PRN

## 2015-12-04 MED ORDER — DOXERCALCIFEROL 4 MCG/2ML IV SOLN
INTRAVENOUS | Status: AC
  Filled 2015-12-04: qty 4

## 2015-12-04 MED ORDER — DARBEPOETIN ALFA 150 MCG/0.3ML IJ SOSY
150.0000 ug | PREFILLED_SYRINGE | INTRAMUSCULAR | Status: DC
  Administered 2015-12-04: 150 ug via INTRAVENOUS

## 2015-12-04 MED ORDER — DOXERCALCIFEROL 4 MCG/2ML IV SOLN
7.0000 ug | INTRAVENOUS | Status: DC
  Administered 2015-12-06: 7 ug via INTRAVENOUS
  Filled 2015-12-04 (×2): qty 4

## 2015-12-04 MED ORDER — SACCHAROMYCES BOULARDII 250 MG PO CAPS
250.0000 mg | ORAL_CAPSULE | Freq: Every day | ORAL | Status: DC
Start: 2015-12-04 — End: 2015-12-09
  Administered 2015-12-04 – 2015-12-08 (×4): 250 mg via ORAL
  Filled 2015-12-04 (×4): qty 1

## 2015-12-04 MED ORDER — SERTRALINE HCL 50 MG PO TABS
25.0000 mg | ORAL_TABLET | Freq: Every day | ORAL | Status: DC
Start: 2015-12-04 — End: 2015-12-09
  Administered 2015-12-04 – 2015-12-08 (×4): 25 mg via ORAL
  Filled 2015-12-04 (×4): qty 1

## 2015-12-04 MED ORDER — ONDANSETRON HCL 4 MG/2ML IJ SOLN: 4.0000 mg | Freq: Four times a day (QID) | INTRAMUSCULAR | Status: DC | PRN

## 2015-12-04 MED ORDER — CALCIUM ACETATE (PHOS BINDER) 667 MG PO CAPS
667.0000 mg | ORAL_CAPSULE | Freq: Three times a day (TID) | ORAL | Status: DC
  Administered 2015-12-04 – 2015-12-08 (×8): 667 mg via ORAL
  Filled 2015-12-04 (×9): qty 1

## 2015-12-04 MED ORDER — POLYETHYLENE GLYCOL 3350 17 G PO PACK
17.0000 g | PACK | Freq: Every day | ORAL | Status: DC
  Administered 2015-12-06: 17 g via ORAL
  Filled 2015-12-04 (×4): qty 1

## 2015-12-04 NOTE — Procedures (Signed)
I was present at this dialysis session. I have reviewed the session itself and made appropriate changes.   Filed Weights   12/04/15 0700  Weight: 60.8 kg (134 lb 0.6 oz)     Recent Labs Lab 12/04/15 0737  NA 136  K 4.3  CL 98*  CO2 28  GLUCOSE 50*  BUN 24*  CREATININE 5.94*  CALCIUM 8.6*     Recent Labs Lab 12/03/15 1450 12/04/15 0737  WBC 6.0 6.6  NEUTROABS 4.1  --   HGB 2.3* 9.3*  HCT 7.7* 29.1*  MCV 90.6 88.2  PLT 258 183    Scheduled Meds: . sodium chloride  10 mL/hr Intravenous Once  . atorvastatin  40 mg Oral QHS  . calcium acetate  667 mg Oral TID WC  . ceFEPime (MAXIPIME) IV  2 g Intravenous Q T,Th,Sat-1800  . darbepoetin (ARANESP) injection - DIALYSIS  150 mcg Intravenous Q Thu-HD  . doxercalciferol  7 mcg Intravenous Q T,Th,Sa-HD  . gabapentin  200 mg Oral Q12H  . hydrALAZINE  100 mg Oral 2 times per day on Sun Mon Wed Fri  . levETIRAcetam  500 mg Oral BID  . losartan  100 mg Oral QPM  . Melatonin  3 mg Oral QHS  . multivitamin  1 tablet Oral QHS  . pantoprazole  40 mg Oral BID  . polyethylene glycol  17 g Oral Daily  . saccharomyces boulardii  250 mg Oral Daily  . senna-docusate  1 tablet Oral BID  . sertraline  25 mg Oral Daily  . sodium chloride flush  3 mL Intravenous Q12H  . vancomycin  750 mg Intravenous Q T,Th,Sa-HD  . verapamil  120 mg Oral Q8H   Continuous Infusions:  PRN Meds:.acetaminophen **OR** acetaminophen, hydrALAZINE, ondansetron **OR** ondansetron (ZOFRAN) IV   [name] Grippe  MD 12/04/2015, 3:01 PM

## 2015-12-04 NOTE — Progress Notes (Signed)
PROGRESS NOTE  [name] [id number] DOB: [birth year] DOA: 12/03/2015 PCP: No primary care provider on file.  Brief History:   60 y.o. male with medical history of ESRD, hypertension, seizure disorder, diastolic CHF, polysubstance abuse including cocaine and tobacco presented with 3 episodes of hematochezia at his nursing facility on 12/03/2015.  Notably, the patient was recently discharged from [facility] after a stay from 8/1-8/9 for sepsis secondary to necrotic right foot. 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 During the hospitalization, the patient also developed respiratory failure secondary to opioid therapy and improved with naloxone. In addition, the patient has had several upper endoscopies, enteroscopy, and colonoscopy. Most recently, the patient had an EGD in 2012 showing small hiatus hernia and colonoscopy in May 2012 with small tubular and normal. Enteroscopy in December 2012 was unremarkable. Capsule endoscopy in May 2015 was unremarkable.  Patient denies fevers, chills, headache, chest pain, dyspnea, nausea, vomiting, diarrhea, abdominal pain, dysuria, hematuria.  Assessment/Plan: Acute blood loss anemia -Secondary to likely lower GI bleed -transfused 4 units PRBC -INR 1.23 -PTT 43 -protonix bid -clear liquid diet for now -am CBC  Hematochezia -appreciate GI consult -remain in stepdown x 24 more hours -hold ASA -plans noted for possible colonoscopy and EGD  Acute Encephalopathy -mulitfactorial including recent illicit substance use, HTN encephalopathy, and profound anemia from ABLA -care taker--Deborah at bedside today states pt is near his baseline today (12/04/15) -suspect he may have some baseline cognitive impairment -pt's family not involved in his life  Fever -rectal temp on 99.9 in ED;  EDP reports temp of 102 at SNF -CXR--no infiltrates -pt is anuric -Blood cultures x 2--neg to date -empiric vanc and cefepime pending  cultures -R-stump does not look infected on exam  HTN urgency -Continue hydralazine, losartan, verapamil -hydralazine prn BP > 180  ESRD -Nephrology has been consulted for maintenance dialysis  Polysubstance abuse -pt claims he smoked cocaine 2 days prior to admission -continues to smoke tobacco -defers nicotine patch  Seizure disorder -continue Keppra  Hyperlipidemia -continue statin   Disposition Plan:   SNF 2-3 days  Family Communication:   [place], Merchant [employer] officer updated at bedside  Consultants:  [facility]  Code Status:  FULL   DVT Prophylaxis:  SCDs   Procedures: As Listed in Progress Note Above  Antibiotics: None    Subjective: Patient denies fevers, chills, headache, chest pain, dyspnea, nausea, vomiting, diarrhea, abdominal pain, dysuria, hematuria, hematochezia, and melena.   Objective: Vitals:   12/04/15 0534 12/04/15 0700 12/04/15 0821 12/04/15 1140  BP:   (!) 145/82 (!) 170/74  Pulse:   63 71  Resp:   17 (!) 21  Temp: 98.4 F (36.9 C)  98.8 F (37.1 C) 98.5 F (36.9 C)  TempSrc: Oral  Axillary Oral  SpO2:   100% 100%  Weight:  60.8 kg (134 lb 0.6 oz)    Height:  5\' 8"  (1.727 m)      Intake/Output Summary (Last 24 hours) at 12/04/15 1215 Last data filed at 12/04/15 0600  Gross per 24 hour  Intake           1577.5 ml  Output                0 ml  Net           1577.5 ml   Weight change:  Exam:   General:  Pt is alert, follows commands appropriately, not in acute distress  HEENT:  No icterus, No thrush, No neck mass, [place]/AT  Cardiovascular: RRR, S1/S2, no rubs, no gallops  Respiratory: CTA bilaterally, no wheezing, no crackles, no rhonchi  Abdomen: Soft/+BS, non tender, non distended, no guarding  Extremities: No edema, No lymphangitis, No petechiae, No rashes, no synovitis; RBKA without erythema or drainage   Data Reviewed: I have personally reviewed following labs and imaging studies Basic Metabolic Panel:  Recent  Labs Lab 12/03/15 1450 12/04/15 0737  NA 137 136  K 4.3 4.3  CL 99* 98*  CO2 31 28  GLUCOSE 59* 50*  BUN 21* 24*  CREATININE 5.25* 5.94*  CALCIUM 8.5* 8.6*   Liver Function Tests:  Recent Labs Lab 12/03/15 1450 12/04/15 0737  AST 18 19  ALT 10* 10*  ALKPHOS 62 59  BILITOT 0.7 1.2  PROT 5.4* 5.6*  ALBUMIN 1.9* 2.0*   No results for input(s): LIPASE, AMYLASE in the last 168 hours. No results for input(s): AMMONIA in the last 168 hours. Coagulation Profile:  Recent Labs Lab 12/03/15 1450  INR 1.23   CBC:  Recent Labs Lab 12/03/15 1450 12/04/15 0737  WBC 6.0 6.6  NEUTROABS 4.1  --   HGB 2.3* 9.3*  HCT 7.7* 29.1*  MCV 90.6 88.2  PLT 258 183   Cardiac Enzymes: No results for input(s): CKTOTAL, CKMB, CKMBINDEX, TROPONINI in the last 168 hours. BNP: Invalid input(s): POCBNP CBG: No results for input(s): GLUCAP in the last 168 hours. HbA1C: No results for input(s): HGBA1C in the last 72 hours. Urine analysis:    Component Value Date/Time   COLORURINE YELLOW 11/07/2012 1903   APPEARANCEUR CLEAR 11/07/2012 1903   LABSPEC 1.012 11/07/2012 1903   PHURINE 8.5 (H) 11/07/2012 1903   GLUCOSEU 250 (A) 11/07/2012 1903   HGBUR NEGATIVE 11/07/2012 1903   BILIRUBINUR NEGATIVE 11/07/2012 1903   KETONESUR NEGATIVE 11/07/2012 1903   PROTEINUR >300 (A) 11/07/2012 1903   UROBILINOGEN 0.2 11/07/2012 1903   NITRITE NEGATIVE 11/07/2012 1903   LEUKOCYTESUR NEGATIVE 11/07/2012 1903   Sepsis Labs: @LABRCNTIP (procalcitonin:4,lacticidven:4) ) Recent Results (from the past 240 hour(s))  MRSA PCR Screening     Status: None   Collection Time: 12/04/15 12:52 AM  Result Value Ref Range Status   MRSA by PCR NEGATIVE NEGATIVE Final    Comment:        The GeneXpert MRSA Assay (FDA approved for NASAL specimens only), is one component of a comprehensive MRSA colonization surveillance program. It is not intended to diagnose MRSA infection nor to guide or monitor treatment  for MRSA infections.      Scheduled Meds: . sodium chloride  10 mL/hr Intravenous Once  . atorvastatin  40 mg Oral QHS  . calcium acetate  667 mg Oral TID WC  . ceFEPime (MAXIPIME) IV  2 g Intravenous Q T,Th,Sat-1800  . gabapentin  200 mg Oral Q12H  . hydrALAZINE  100 mg Oral 2 times per day on Sun Mon Wed Fri  . levETIRAcetam  500 mg Oral BID  . losartan  100 mg Oral QPM  . Melatonin  3 mg Oral QHS  . multivitamin  1 tablet Oral QHS  . pantoprazole  40 mg Oral BID  . polyethylene glycol  17 g Oral Daily  . saccharomyces boulardii  250 mg Oral Daily  . senna-docusate  1 tablet Oral BID  . sertraline  25 mg Oral Daily  . sodium chloride flush  3 mL Intravenous Q12H  . vancomycin  750 mg Intravenous Q T,Th,Sa-HD  . verapamil  120 mg Oral Q8H   Continuous Infusions:   Procedures/Studies: Dg Chest Port 1 View  Result Date: 12/03/2015 CLINICAL DATA:  Fever and fatigue. EXAM: PORTABLE CHEST 1 VIEW COMPARISON:  08/16/2015 FINDINGS: 1514 hours. Lungs are hyperexpanded. The lungs are clear wiithout focal pneumonia, edema, pneumothorax or pleural effusion. The cardio pericardial silhouette is enlarged. The visualized bony structures of the thorax are intact. Degenerative changes are seen in the glenohumeral joints bilaterally. IMPRESSION: No active disease. Electronically Signed   By: Misty Stanley M.D.   On: 12/03/2015 15:28    Casilda Pickerill, DO  [facility] Pager [phone]  If 7PM-7AM, please contact night-coverage [email or web address] Password [email or web address] 12/04/2015, 12:15 PM   LOS: 1 day

## 2015-12-04 NOTE — Progress Notes (Signed)
Paged Dr. Shanon Brow Tat to inform of sustained HTN in 200's for pt; he received hydralazine 10mg  IV at 1310; he gave a  one time order of hydralazine 10mg  IV . I also challenged the HD goal from 1027ml to 1569ml to help lower the BPs. Both interventions did not resolve the BPs; Austin Miles, RN made aware during report.

## 2015-12-04 NOTE — Progress Notes (Signed)
Pharmacy Antibiotic Note  [name] is a 60 y.o. male admitted on 12/03/2015 with fever on uknown origin.  Pharmacy has been consulted for Vancomycin/Cefepime dosing. WBC WNL. Pt has ESRD on HD Tues/Thurs/Sat.   Plan: -Vancomycin 1500 mg IV load, then give 750 mg IV qHD-Tues/Thurs/Sat -Cefepime 2g IV x 1, then q1800 on HD days-Tues/Thurs/Sat -Trend WBC, temp, HD schedule -Drug levels as indicated   Temp (24hrs), Avg:98.5 F (36.9 C), Min:98 F (36.7 C), Max:99.9 F (37.7 C)   Recent Labs Lab 12/03/15 1450 12/03/15 1511 12/03/15 2101  WBC 6.0  --   --   CREATININE 5.25*  --   --   LATICACIDVEN  --  0.39* 0.38*    CrCl cannot be calculated (Unknown ideal weight.).    Allergies  Allergen Reactions  . Reglan [Metoclopramide] Other (See Comments)    Tardive dyskinesia in 11/2011 in [place] [facility] 12/04/2015 12:14 AM

## 2015-12-04 NOTE — Progress Notes (Signed)
Pt returned from HD as scheduled. Pt SBP 204, PO medications ordered, pt refuses to take them. Education around BP meds and risks associated with not taking them provided to the patient.   10mg  IV hydralazine given per the order, will continue to monitor.

## 2015-12-04 NOTE — Consult Note (Signed)
[facility] Renal Consultation Note    Indication for Consultation:  Management of ESRD/hemodialysis; anemia, hypertension/volume and secondary hyperparathyroidism  HPI: [name] is a 60 y.o. male with ESRD on hemodialysis. His medical history is significant for hypertensive, hepatitis C, hepatitis B core antibody positive, GI bleed, Chronic diastolic HF, medical noncompliance, seizure disorder, cocaine abuse. He was recently discharged from [facility] after a stay from 8/1-11/26/15 for sepsis d/t necrotic right foot. 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 He presented to ED yesterday 12/03/15 from SNF with fever and 3 episodes of hematochezia. He has had multiple upper endoscopies, enteroscopy, and colonoscopy d/t chronic anemia. EGD in 2011 (gastritis), and 2012 (small hiatal hernia). Colonoscopy in May of 2012 with small tubular adenoma.  Hgb 2.3 in ED yesterday.  Temp 102F reported at SNF 99.9 in Ed. Blood cultures obtained and transfused 4 units PRBCs. GI following  HD at [facility]. EDW 62.5 Last center HD on 12/02/15. Left under EDW at 61.9kg Currently pt is awake but confused to location and time. [name] seen putting imaginary food in mouth. He denies chest pain/SOB, chills, N/V, abdominal pain diarrhea.   Past Medical History:  Diagnosis Date  . Anemia, chronic disease    a. Capsule endoscopy reportedly negative 08/2013. b. seen by heme with concern for minor B cell population on BMB, being observed.  . Arthritis    "right shoulder" (11/09/2012)  . Chronic diastolic CHF (congestive heart failure) ([place])    a. EF 60 - 65% per [place] echo 11/2011. b. Echo 02/2012: severe LVH, focal basal hypertrophy, EF 60-65%, mild Mr.  . Cocaine abuse    mentioned in notes from [place]  . ESRD (end stage renal disease) on dialysis [facility])    "TTS; [address]" (06/19/2015)  . ESRD on hemodialysis ([place]) since 2012   a. Since 2012. ESRD was due to "drugs", primarily used  cocaine.  Has 3-5 year hx of HTN, no DM.  Gets HD on TTS schedule at [place]. Originally from [place].   . GI bleed 05/04/2012  . Head injury, closed, with concussion ([place])   . Headache(784.0)   . Helicobacter pylori gastritis    not defined if this was treated  . Hematochezia   . Hepatitis B core antibody positive    03/01/10  . Hepatitis C antibody test positive    was HIV negative, 02/28/12  . History of blood transfusion   . Hypercalcemia   . Hyperpotassemia   . Hypertension   . Hypertensive heart disease   . Optic neuritis, left   . Polyp of colon, adenomatous    May 2012.  Dr Trenton Founds in [place]  . Positive QuantiFERON-TB Gold test    11/2011  . Protein-calorie malnutrition, severe ([place])   . Seizure disorder [facility])    questionable history of - will need to clarify with PCP  . Tobacco abuse    Past Surgical History:  Procedure Laterality Date  . [place] TRANSPOSITION  03/07/2012   Procedure: BASCILIC VEIN TRANSPOSITION;  Surgeon: Conrad [place], MD;  Location: [place];  Service: Vascular;  Laterality: Left;  First Stage  . BASCILIC VEIN TRANSPOSITION Left 05/31/2012   Procedure: BASCILIC VEIN TRANSPOSITION;  Surgeon: Conrad , MD;  Location: [place];  Service: Vascular;  Laterality: Left;  Left 2nd Stage Basilic Vein Transposition with gortex graft revision using 65mmx10cm graft  . [name] CAPSULE STUDY N/A 08/27/2013   Procedure: [name] CAPSULE STUDY;  Surgeon: Milus Banister, MD;  Location: [place];  Service: Endoscopy;  Laterality: N/A;  . INSERTION OF DIALYSIS CATHETER     right chest  . JOINT REPLACEMENT    . SHOULDER OPEN ROTATOR CUFF REPAIR Right   . TOTAL KNEE ARTHROPLASTY Left    Family History  Problem Relation Age of Onset  . Diabetes Mother   . Hypertension Mother   . Stroke Mother   . Kidney failure Mother   . Cancer Father    Social History:  reports that he has been smoking Cigarettes.  He has a 67.50 pack-year smoking history. He  has never used smokeless tobacco. He reports that he drinks about 14.4 oz of alcohol per week . He reports that he uses drugs, including "Crack" cocaine, Cocaine, and Marijuana. Allergies  Allergen Reactions  . Reglan [Metoclopramide] Other (See Comments)    Tardive dyskinesia in 11/2011 in [place]   Prior to Admission medications   Medication Sig Start Date End Date Taking? Authorizing Provider  amantadine (SYMMETREL) 100 MG capsule Take 100 mg by mouth 3 (three) times daily.   Yes Historical Provider, MD  aspirin EC 81 MG EC tablet Take 1 tablet (81 mg total) by mouth daily. 08/20/15  Yes Ripudeep Krystal Eaton, MD  atorvastatin (LIPITOR) 40 MG tablet Take 1 tablet (40 mg total) by mouth at bedtime. 08/20/15  Yes Ripudeep Krystal Eaton, MD  B Complex-C-Folic Acid (RENA-VITE PO) Take 1 tablet by mouth daily.   Yes Historical Provider, MD  calcium acetate (PHOSLO) 667 MG capsule Take 667 mg by mouth 3 (three) times daily with meals.    Yes Historical Provider, MD  gabapentin (NEURONTIN) 100 MG capsule Take 200 mg by mouth every 12 (twelve) hours.    Yes Historical Provider, MD  hydrALAZINE (APRESOLINE) 100 MG tablet Take 100 mg by mouth 2 (two) times daily. Sunday, Monday,wednesday,friday   Yes Historical Provider, MD  levETIRAcetam (KEPPRA) 500 MG tablet Take 500 mg by mouth 2 (two) times daily.   Yes Historical Provider, MD  levofloxacin (LEVAQUIN) 750 MG tablet Take 750 mg by mouth daily. For 7 days. Started on 11-27-15   Yes Historical Provider, MD  losartan (COZAAR) 100 MG tablet Take 100 mg by mouth every evening. 10/02/14  Yes Historical Provider, MD  Melatonin 3 MG TABS Take 3 mg by mouth at bedtime.   Yes Historical Provider, MD  ondansetron (ZOFRAN) 4 MG tablet Take 4 mg by mouth every 8 (eight) hours as needed for nausea or vomiting.   Yes Historical Provider, MD  oxyCODONE-acetaminophen (PERCOCET/ROXICET) 5-325 MG tablet Take 1 tablet by mouth every 4 (four) hours as needed for severe pain.   Yes  Historical Provider, MD  polyethylene glycol (MIRALAX / GLYCOLAX) packet Take 17 g by mouth daily. 08/26/15  Yes Bonnell Public, MD  saccharomyces boulardii (FLORASTOR) 250 MG capsule Take 250 mg by mouth daily.   Yes Historical Provider, MD  senna-docusate (SENOKOT-S) 8.6-50 MG tablet Take 1 tablet by mouth 2 (two) times daily. For constipation 08/21/15  Yes Ripudeep K Rai, MD  sertraline (ZOLOFT) 25 MG tablet Take 25 mg by mouth daily.   Yes Historical Provider, MD  verapamil (CALAN) 120 MG tablet Take 1 tablet (120 mg total) by mouth every 8 (eight) hours. 08/20/15  Yes Ripudeep Krystal Eaton, MD  hydrALAZINE (APRESOLINE) 50 MG tablet Take 1 tablet (50 mg total) by mouth 3 (three) times daily. 08/21/15   Ripudeep Krystal Eaton, MD  HYDROcodone-acetaminophen (NORCO/VICODIN) [phone]  MG tablet Take 1 tablet by mouth every 6 (six) hours as needed for moderate pain.    Historical Provider, MD  ondansetron (ZOFRAN-ODT) 4 MG disintegrating tablet Take 4 mg by mouth every 8 (eight) hours as needed for nausea or vomiting.    Historical Provider, MD  sodium polystyrene (KAYEXALATE) 15 GM/60ML suspension Take 15 g by mouth once.    Historical Provider, MD   Current Facility-Administered Medications  Medication Dose Route Frequency Provider Last Rate Last Dose  . 0.9 %  sodium chloride infusion  10 mL/hr Intravenous Once Forde Dandy, MD      . acetaminophen (TYLENOL) tablet 650 mg  650 mg Oral Q6H PRN Orson Eva, MD   650 mg at 12/04/15 0543   Or  . acetaminophen (TYLENOL) suppository 650 mg  650 mg Rectal Q6H PRN Orson Eva, MD      . atorvastatin (LIPITOR) tablet 40 mg  40 mg Oral QHS Orson Eva, MD   40 mg at 12/04/15 0203  . calcium acetate (PHOSLO) capsule 667 mg  667 mg Oral TID WC Orson Eva, MD   667 mg at 12/04/15 0816  . ceFEPIme (MAXIPIME) 2 g in dextrose 5 % 50 mL IVPB  2 g Intravenous Q T,Th,Sat-1800 James Elise Benne, RPH      . gabapentin (NEURONTIN) capsule 200 mg  200 mg Oral Q12H Orson Eva, MD   200 mg at 12/04/15  1005  . hydrALAZINE (APRESOLINE) injection 10 mg  10 mg Intravenous Q6H PRN Orson Eva, MD      . hydrALAZINE (APRESOLINE) tablet 100 mg  100 mg Oral 2 times per day on Sun Mon Wed Fri Orson Eva, MD   100 mg at 12/04/15 0205  . levETIRAcetam (KEPPRA) tablet 500 mg  500 mg Oral BID Orson Eva, MD   500 mg at 12/04/15 1005  . losartan (COZAAR) tablet 100 mg  100 mg Oral QPM Orson Eva, MD      . Melatonin TABS 3 mg  3 mg Oral QHS Orson Eva, MD      . multivitamin (RENA-VIT) tablet 1 tablet  1 tablet Oral QHS Orson Eva, MD   1 tablet at 12/04/15 0203  . ondansetron (ZOFRAN) tablet 4 mg  4 mg Oral Q6H PRN Orson Eva, MD       Or  . ondansetron [facility]) injection 4 mg  4 mg Intravenous Q6H PRN Orson Eva, MD      . pantoprazole (PROTONIX) EC tablet 40 mg  40 mg Oral BID Orson Eva, MD   40 mg at 12/04/15 1005  . polyethylene glycol (MIRALAX / GLYCOLAX) packet 17 g  17 g Oral Daily Shanon Brow Tat, MD      . saccharomyces boulardii (FLORASTOR) capsule 250 mg  250 mg Oral Daily Orson Eva, MD   250 mg at 12/04/15 1005  . senna-docusate (Senokot-S) tablet 1 tablet  1 tablet Oral BID Orson Eva, MD      . sertraline (ZOLOFT) tablet 25 mg  25 mg Oral Daily Orson Eva, MD   25 mg at 12/04/15 1005  . sodium chloride flush (NS) 0.9 % injection 3 mL  3 mL Intravenous Q12H Orson Eva, MD   3 mL at 12/04/15 1000  . vancomycin (VANCOCIN) IVPB 750 mg/150 ml premix  750 mg Intravenous Q T,Th,Sa-HD Erenest Blank, RPH      . verapamil (CALAN) tablet 120 mg  120 mg Oral Q8H Orson Eva, MD   120 mg at 12/04/15  0543   Labs: Basic Metabolic Panel:  Recent Labs Lab 12/03/15 1450 12/04/15 0737  NA 137 136  K 4.3 4.3  CL 99* 98*  CO2 31 28  GLUCOSE 59* 50*  BUN 21* 24*  CREATININE 5.25* 5.94*  CALCIUM 8.5* 8.6*   Liver Function Tests:  Recent Labs Lab 12/03/15 1450 12/04/15 0737  AST 18 19  ALT 10* 10*  ALKPHOS 62 59  BILITOT 0.7 1.2  PROT 5.4* 5.6*  ALBUMIN 1.9* 2.0*   No results for input(s): LIPASE, AMYLASE  in the last 168 hours. No results for input(s): AMMONIA in the last 168 hours. CBC:  Recent Labs Lab 12/03/15 1450 12/04/15 0737  WBC 6.0 6.6  NEUTROABS 4.1  --   HGB 2.3* 9.3*  HCT 7.7* 29.1*  MCV 90.6 88.2  PLT 258 183   Cardiac Enzymes: No results for input(s): CKTOTAL, CKMB, CKMBINDEX, TROPONINI in the last 168 hours. CBG: No results for input(s): GLUCAP in the last 168 hours. Iron Studies: No results for input(s): IRON, TIBC, TRANSFERRIN, FERRITIN in the last 72 hours. Studies/Results: Dg Chest Port 1 View  Result Date: 12/03/2015 CLINICAL DATA:  Fever and fatigue. EXAM: PORTABLE CHEST 1 VIEW COMPARISON:  08/16/2015 FINDINGS: 1514 hours. Lungs are hyperexpanded. The lungs are clear wiithout focal pneumonia, edema, pneumothorax or pleural effusion. The cardio pericardial silhouette is enlarged. The visualized bony structures of the thorax are intact. Degenerative changes are seen in the glenohumeral joints bilaterally. IMPRESSION: No active disease. Electronically Signed   By: Misty Stanley M.D.   On: 12/03/2015 15:28    ROS: As per HPI otherwise negative.  Physical Exam: Vitals:   12/04/15 0534 12/04/15 0700 12/04/15 0821 12/04/15 1140  BP:   (!) 145/82 (!) 170/74  Pulse:   63 71  Resp:   17 (!) 21  Temp: 98.4 F (36.9 C)  98.8 F (37.1 C) 98.5 F (36.9 C)  TempSrc: Oral  Axillary Oral  SpO2:   100% 100%  Weight:  60.8 kg (134 lb 0.6 oz)    Height:  5\' 8"  (1.727 m)       General: Appears older than stated age, awake but disoriented  Head: NCAT sclera not icteric MMM Neck: Supple.  Lungs: CTA bilaterally without wheezes, rales, or rhonchi. Breathing is unlabored. Heart: RRR with S1 S2.  Abdomen: soft NT + BS Lower extremities: R BKA stump wraped; no L LE edema Neuro: Disoriented to time and place  Psych:  Responds to questions appropriately with a normal affect. Dialysis Access: L AVF + thrill/bruit   Dialysis Orders:  [facility] TTS BFR 400, DFR  Autoflow 1.5 EDW 62.5 (kg),  2 K, 2.25 Ca,  UFR Profile: Profile 2 LUA AVF NO Heparin Hectorol 7 mcg IV q treatment (last dose 12/02/15) Venofer 100 mg IV q week (last dose 12/02/15)  Mircera 150 mcg IV q 2 weeks (last dose 11/11/15)    Assessment/Plan: 1.  Acute blood loss anemia/hematochezia - per GI needs EGD and colonoscopy to localize source of bleeding, will hold procedures as long as patient stable until mental status improves 2.  ESRD -  [place] TTS - Will have HD today on schedule, no UF K 4.3  3. Hypertension/volume  - volume status ok, Continue hydralazine, losartan, verapamil IV hydralazine PRN  4.  Anemia  - Hgb 9.3 up from 2.3 at admission- transfused 4 units PRBCs yesterday - will administer ESA with HD today Tsat 14% in center - last Fe 12/02/15  5.  Metabolic bone disease -  Ca 8.1 Phos 5 iPTH 402 on Hectorol 7 mcg, calcium acetate binder, continue VDRA, binders  6.  Nutrition - Albumin 2.9, on clear liquids, renal vitamin, add protein supplement  7. Altered mental status/fever - unclear of etiology and baseline status -  will monitor Blood cultures pending  Lynnda Child PA-C [facility] 12/04/2015, 11:55 AM

## 2015-12-04 NOTE — Progress Notes (Signed)
Pt transferred from ED to 3s13 with blood transfusing, 1 more unit to be infused after.  Pt states 8/10 pain, only tylenol PRN scheduled.  NP Baltazar Najjar on call notified.  Orders given.  Will continue to monitor.

## 2015-12-04 NOTE — Progress Notes (Signed)
Hypoglycemic Event  CBG: 59  Treatment: D50 IV 50 mL  Symptoms: Lethargic  Follow-up CBG: Time:2335 CBG Result:128  Possible Reasons for Event: Inadequate meal intake  Comments/MD notified:K. Sofia of [facility] notified. No orders received. Will continue to monitor.    Sharlotte Alamo

## 2015-12-05 ENCOUNTER — Inpatient Hospital Stay (HOSPITAL_COMMUNITY): Payer: Medicare Other

## 2015-12-05 DIAGNOSIS — Z720 Tobacco use: Secondary | ICD-10-CM

## 2015-12-05 DIAGNOSIS — G40909 Epilepsy, unspecified, not intractable, without status epilepticus: Secondary | ICD-10-CM

## 2015-12-05 DIAGNOSIS — R4182 Altered mental status, unspecified: Secondary | ICD-10-CM

## 2015-12-05 LAB — TYPE AND SCREEN
ABO/RH(D): A POS
ANTIBODY SCREEN: NEGATIVE
UNIT DIVISION: 0
Unit division: 0
Unit division: 0
Unit division: 0

## 2015-12-05 LAB — GLUCOSE, CAPILLARY
GLUCOSE-CAPILLARY: 75 mg/dL (ref 65–99)
GLUCOSE-CAPILLARY: 85 mg/dL (ref 65–99)
GLUCOSE-CAPILLARY: 87 mg/dL (ref 65–99)
Glucose-Capillary: 66 mg/dL (ref 65–99)
Glucose-Capillary: 139 mg/dL — ABNORMAL HIGH (ref 65–99)
Glucose-Capillary: 80 mg/dL (ref 65–99)
Glucose-Capillary: 75 mg/dL (ref 65–99)
Glucose-Capillary: 75 mg/dL (ref 65–99)
Glucose-Capillary: 79 mg/dL (ref 65–99)
Glucose-Capillary: 84 mg/dL (ref 65–99)
Glucose-Capillary: 92 mg/dL (ref 65–99)

## 2015-12-05 LAB — COMPREHENSIVE METABOLIC PANEL
ALT: 11 U/L — ABNORMAL LOW (ref 17–63)
AST: 17 U/L (ref 15–41)
Albumin: 2.1 g/dL — ABNORMAL LOW (ref 3.5–5.0)
Alkaline Phosphatase: 68 U/L (ref 38–126)
Anion gap: 8 (ref 5–15)
BILIRUBIN TOTAL: 0.8 mg/dL (ref 0.3–1.2)
BUN: 10 mg/dL (ref 6–20)
CO2: 28 mmol/L (ref 22–32)
Calcium: 8.9 mg/dL (ref 8.9–10.3)
Chloride: 101 mmol/L (ref 101–111)
Creatinine, Ser: 4.18 mg/dL — ABNORMAL HIGH (ref 0.61–1.24)
GFR, EST AFRICAN AMERICAN: 17 mL/min — AB (ref >60)
GFR, EST NON AFRICAN AMERICAN: 14 mL/min — AB (ref >60)
Glucose, Bld: 77 mg/dL (ref 65–99)
POTASSIUM: 3.6 mmol/L (ref 3.5–5.1)
Sodium: 137 mmol/L (ref 135–145)
TOTAL PROTEIN: 6.1 g/dL — AB (ref 6.5–8.1)

## 2015-12-05 LAB — CBC
HEMATOCRIT: 32 % — AB (ref 39.0–52.0)
HEMOGLOBIN: 10 g/dL — AB (ref 13.0–17.0)
MCH: 27.8 pg (ref 26.0–34.0)
MCHC: 31.3 g/dL (ref 30.0–36.0)
MCV: 88.9 fL (ref 78.0–100.0)
Platelets: 193 10*3/uL (ref 150–400)
RBC: 3.6 MIL/uL — AB (ref 4.22–5.81)
RDW: 16.3 % — ABNORMAL HIGH (ref 11.5–15.5)
WBC: 6.2 10*3/uL (ref 4.0–10.5)

## 2015-12-05 LAB — BLOOD GAS, ARTERIAL
Acid-Base Excess: 5.2 mmol/L — ABNORMAL HIGH (ref 0.0–2.0)
BICARBONATE: 29.8 meq/L — AB (ref 20.0–24.0)
Drawn by: 22766
FIO2: 28
O2 Content: 2 L/min
O2 SAT: 98.6 %
PATIENT TEMPERATURE: 99
PCO2 ART: 49.5 mmHg — AB (ref 35.0–45.0)
PH ART: 7.398 (ref 7.350–7.450)
PO2 ART: 122 mmHg — AB (ref 80.0–100.0)
TCO2: 31.3 mmol/L (ref 0–100)

## 2015-12-05 LAB — VITAMIN B12: VITAMIN B 12: 744 pg/mL (ref 180–914)

## 2015-12-05 LAB — AMMONIA: Ammonia: 30 umol/L (ref 9–35)

## 2015-12-05 LAB — CORTISOL-AM, BLOOD: CORTISOL - AM: 7.5 ug/dL (ref 6.7–22.6)

## 2015-12-05 LAB — TSH: TSH: 5.878 u[IU]/mL — ABNORMAL HIGH (ref 0.350–4.500)

## 2015-12-05 MED ORDER — DEXTROSE 10 % IV SOLN
INTRAVENOUS | Status: DC
  Administered 2015-12-05 – 2015-12-07 (×2): via INTRAVENOUS

## 2015-12-05 MED ORDER — HYDRALAZINE HCL 20 MG/ML IJ SOLN
5.0000 mg | Freq: Three times a day (TID) | INTRAMUSCULAR | Status: DC
  Administered 2015-12-05 – 2015-12-06 (×4): 5 mg via INTRAVENOUS
  Filled 2015-12-05 (×4): qty 1

## 2015-12-05 MED ORDER — DEXTROSE 50 % IV SOLN
INTRAVENOUS | Status: AC
  Administered 2015-12-05: 50 mL
  Filled 2015-12-05: qty 50

## 2015-12-05 MED ORDER — PRO-STAT SUGAR FREE PO LIQD
30.0000 mL | Freq: Two times a day (BID) | ORAL | Status: DC
  Administered 2015-12-05 – 2015-12-08 (×5): 30 mL via ORAL
  Filled 2015-12-05 (×6): qty 30

## 2015-12-05 MED ORDER — LABETALOL HCL 5 MG/ML IV SOLN
10.0000 mg | Freq: Four times a day (QID) | INTRAVENOUS | Status: DC
  Administered 2015-12-05 – 2015-12-06 (×4): 10 mg via INTRAVENOUS
  Filled 2015-12-05 (×4): qty 4

## 2015-12-05 MED ORDER — HYDRALAZINE HCL 20 MG/ML IJ SOLN
INTRAMUSCULAR | Status: AC
  Filled 2015-12-05: qty 1

## 2015-12-05 MED ORDER — CETYLPYRIDINIUM CHLORIDE 0.05 % MT LIQD
7.0000 mL | Freq: Two times a day (BID) | OROMUCOSAL | Status: DC
  Administered 2015-12-05 – 2015-12-08 (×6): 7 mL via OROMUCOSAL

## 2015-12-05 MED ORDER — LEVETIRACETAM 500 MG/5ML IV SOLN
500.0000 mg | Freq: Two times a day (BID) | INTRAVENOUS | Status: DC
  Administered 2015-12-05 – 2015-12-06 (×3): 500 mg via INTRAVENOUS
  Filled 2015-12-05 (×4): qty 5

## 2015-12-05 MED ORDER — DEXTROSE 50 % IV SOLN
INTRAVENOUS | Status: AC
  Filled 2015-12-05: qty 50

## 2015-12-05 MED ORDER — LABETALOL HCL 5 MG/ML IV SOLN
INTRAVENOUS | Status: AC
Start: 2015-12-05 — End: 2015-12-05
  Filled 2015-12-05: qty 4

## 2015-12-05 NOTE — Procedures (Signed)
ELECTROENCEPHALOGRAM REPORT  Date of Study: 12/05/2015  Patient's Name: [name] MRN: [id number] Date of Birth: 05/26/1955  Referring Provider: Dr. Shanon Brow Tat  Clinical History: This is a 60 year old man with altered mental status  Medications: levETIRAcetam (KEPPRA) 500 mg in sodium chloride 0.9 % 100 mL IVPB  acetaminophen (TYLENOL) tablet 650 mg  atorvastatin (LIPITOR) tablet 40 mg  calcium acetate (PHOSLO) capsule 667 mg  ceFEPIme (MAXIPIME) 2 g in dextrose 5 % 50 mL IVPB  Darbepoetin Alfa (ARANESP) injection 150 mcg  doxercalciferol (HECTOROL) injection 7 mcg  losartan (COZAAR) tablet 100 mg  multivitamin (RENA-VIT) tablet 1 tablet  sertraline (ZOLOFT) tablet 25 mg  vancomycin (VANCOCIN) IVPB 750 mg/150 ml premix  verapamil (CALAN) tablet 120 mg   Technical Summary: A multichannel digital EEG recording measured by the international 10-20 system with electrodes applied with paste and impedances below 5000 ohms performed as portable with EKG monitoring in an awake and asleep patient.  Hyperventilation and photic stimulation were not performed.  The digital EEG was referentially recorded, reformatted, and digitally filtered in a variety of bipolar and referential montages for optimal display.   Description: The patient is awake and asleep during the recording.  During maximal wakefulness, there is a symmetric, medium voltage 8 Hz posterior dominant rhythm that attenuates with eye opening. This is admixed with a small amount of diffuse 5-7 Hz theta slowing of the waking background.  During drowsiness and stage I  sleep, there is an increase in theta slowing of the background with rare vertex waves seen. Hyperventilation and photic stimulation were not performed. There were no epileptiform discharges or electrographic seizures seen.    EKG lead was unremarkable.  Impression: This awake and asleep EEG is abnormal due to mild diffuse slowing of the waking background.  Clinical  Correlation of the above findings indicates diffuse cerebral dysfunction that is non-specific in etiology and can be seen with hypoxic/ischemic injury, toxic/metabolic encephalopathies, neurodegenerative disorders, medication effect, or from excessive drowsiness.  The absence of epileptiform discharges does not rule out a clinical diagnosis of epilepsy.  Clinical correlation is advised.   Ellouise Newer, M.D.

## 2015-12-05 NOTE — Care Management Note (Signed)
Case Management Note  Patient Details  Name: [name] MRN: [id number] Date of Birth: Mar 03, 1956  Subjective/Objective:   Patient is from [facility] SNF, presents with GIB, has friend who was wanting to know about getting gurardianship for patient yesterday, NCM informed her to talk to her attorney about paper for that per CSW.  CSW referral for SNF.               Action/Plan:   Expected Discharge Date:                  Expected Discharge Plan:  Skilled Nursing Facility  In-House Referral:  Clinical Social Work  Discharge planning Services  CM Consult  Post Acute Care Choice:    Choice offered to:     DME Arranged:    DME Agency:     HH Arranged:    [place] Agency:     Status of Service:  Completed, signed off  If discussed at [employer], dates discussed:    Additional Comments:  Zenon Mayo, RN 12/05/2015, 4:45 PM

## 2015-12-05 NOTE — Progress Notes (Signed)
[facility] Progress Note  Assessment/Plan: 1. Acute blood loss anemia/hematochezia - per GI needs EGD and colonoscopy to localize source of bleeding, will hold procedures as long as patient stable until AMS improves 2. ESRD -  [facility] TTS - HD 8/17 will place orders for 8/19 3. Hypertension/volume  - HD 8/17 net UF 1.5L  Post weight 61.2 kg below OP EDW BP elevated no edema-  Continue hydralazine, losartan, verapamil IV hydralazine PRN  4. Anemia  - Hgb 10.0 up from 2.3 at admission- transfused 4 units PRBCs 8/16 - Aranesp 150 mcg on 8/17   5. Metabolic bone disease -  Ca 8.6 Hectorol 7 mcg, calcium acetate binder, continue VDRA, binders 6. Nutrition - Albumin 2.0, on clear liquids, renal vitamin,  protein supplement  7. Altered mental status/fever - unclear of etiology and baseline status - brain CT 8/18 - no acute findings Blood cultures pending on vanc/cefepime  Lynnda Child PA-C [place] 12/05/2015,10:31 AM  LOS: 2 days   Subjective:   Lethargic difficult to wake. Oriented to place, knows in hospital and name. HD yesterday - tolerated. No c/os today  Objective Vitals:   12/05/15 0545 12/05/15 0620 12/05/15 0640 12/05/15 0735  BP: (!) 188/72 (!) 192/84 (!) 186/80 (!) 187/74  Pulse: 76 76 76 76  Resp: 18 18 17 17   Temp:    97.5 F (36.4 C)  TempSrc:    Axillary  SpO2: 100% 100% 100% 100%  Weight:      Height:       Physical Exam General: Lethargic difficult to wake  Heart: RRR with S1 S2.  Lungs: Breathing unlabored. Lungs CTAB Abdomen: soft NT +BS  Extremities: R BKA stump wrapped; no L LE edema Dialysis Access:  LAVF+ thrill/bruit  Dialysis Orders: [facility] TTS BFR 400, DFR Autoflow 1.5  EDW 62.5 (kg),  2 K, 2.25 Ca, UFR Profile: Profile 2 LUA AVF NO Heparin Hectorol 7 mcg IV q treatment (last dose 12/02/15) Venofer 100 mg IV q week (last dose 12/02/15)  Mircera 150 mcg IV q 2 weeks (last dose 11/11/15)  OP Labs: Hgb 8.0  Tsat 14% K 4.9 Ca 8.1 Phos 5 iPTH 402  Additional Objective Labs: Basic Metabolic Panel:  Recent Labs Lab 12/03/15 1450 12/04/15 0737  NA 137 136  K 4.3 4.3  CL 99* 98*  CO2 31 28  GLUCOSE 59* 50*  BUN 21* 24*  CREATININE 5.25* 5.94*  CALCIUM 8.5* 8.6*   Liver Function Tests:  Recent Labs Lab 12/03/15 1450 12/04/15 0737  AST 18 19  ALT 10* 10*  ALKPHOS 62 59  BILITOT 0.7 1.2  PROT 5.4* 5.6*  ALBUMIN 1.9* 2.0*   No results for input(s): LIPASE, AMYLASE in the last 168 hours. CBC:  Recent Labs Lab 12/03/15 1450 12/04/15 0737 12/05/15 0417  WBC 6.0 6.6 6.2  NEUTROABS 4.1  --   --   HGB 2.3* 9.3* 10.0*  HCT 7.7* 29.1* 32.0*  MCV 90.6 88.2 88.9  PLT 258 183 193   Blood Culture    Component Value Date/Time   SDES BLOOD LEFT HAND 12/03/2015 1518   SPECREQUEST BOTTLES DRAWN AEROBIC AND ANAEROBIC 5CC 12/03/2015 1518   CULT NO GROWTH < 24 HOURS 12/03/2015 1518   REPTSTATUS PENDING 12/03/2015 1518    Cardiac Enzymes: No results for input(s): CKTOTAL, CKMB, CKMBINDEX, TROPONINI in the last 168 hours. CBG:  Recent Labs Lab 12/05/15 0325 12/05/15 0357 12/05/15 0545 12/05/15 0736 12/05/15 0914  GLUCAP 66  139* 80 75 75   Iron Studies: No results for input(s): IRON, TIBC, TRANSFERRIN, FERRITIN in the last 72 hours. Lab Results  Component Value Date   INR 1.23 12/03/2015   INR 0.96 08/16/2015   INR 1.26 04/12/2015   Studies/Results: Ct Head Wo Contrast  Result Date: 12/05/2015 CLINICAL DATA:  Altered mental status.  Chronic renal failure EXAM: CT HEAD WITHOUT CONTRAST TECHNIQUE: Contiguous axial images were obtained from the base of the skull through the vertex without intravenous contrast. COMPARISON:  February 01, 2014 FINDINGS: Brain: Ventricles and sulci are within normal limits for age. There is no intracranial mass, hemorrhage, extra-axial fluid collection, or midline shift. There is patchy small vessel disease in the centra semiovale bilaterally,  most notably in the anterior centra semiovale bilaterally, stable. Small vessel disease is also noted in the mid pons in the basilar perforator distribution. Elsewhere gray-white compartments appear normal. No acute infarct evident. Vascular: There is no hyperdense vessel. There are foci of calcification in each carotid siphon and distal vertebral artery region. Skull: The bony calvarium appears intact. Sinuses/Orbits: Orbits appear symmetric bilaterally. Visualized paranasal sinuses are clear. Other: Mastoid air cells are clear. IMPRESSION: Stable supratentorial infratentorial small vessel disease. No acute infarct evident. No hemorrhage or mass effect. Foci of arterial vascular calcification noted. Electronically Signed   By: Lowella Grip III M.D.   On: 12/05/2015 09:06   Dg Chest Port 1 View  Result Date: 12/03/2015 CLINICAL DATA:  Fever and fatigue. EXAM: PORTABLE CHEST 1 VIEW COMPARISON:  08/16/2015 FINDINGS: 1514 hours. Lungs are hyperexpanded. The lungs are clear wiithout focal pneumonia, edema, pneumothorax or pleural effusion. The cardio pericardial silhouette is enlarged. The visualized bony structures of the thorax are intact. Degenerative changes are seen in the glenohumeral joints bilaterally. IMPRESSION: No active disease. Electronically Signed   By: Misty Stanley M.D.   On: 12/03/2015 15:28   Medications:   . antiseptic oral rinse  7 mL Mouth Rinse BID  . atorvastatin  40 mg Oral QHS  . calcium acetate  667 mg Oral TID WC  . ceFEPime (MAXIPIME) IV  2 g Intravenous Q T,Th,Sat-1800  . darbepoetin (ARANESP) injection - DIALYSIS  150 mcg Intravenous Q Thu-HD  . dextrose      . doxercalciferol  7 mcg Intravenous Q T,Th,Sa-HD  . hydrALAZINE  5 mg Intravenous Q8H  . labetalol  10 mg Intravenous Q6H  . levETIRAcetam  500 mg Intravenous BID  . losartan  100 mg Oral QPM  . multivitamin  1 tablet Oral QHS  . pantoprazole  40 mg Oral BID  . polyethylene glycol  17 g Oral Daily  .  saccharomyces boulardii  250 mg Oral Daily  . senna-docusate  1 tablet Oral BID  . sertraline  25 mg Oral Daily  . sodium chloride flush  3 mL Intravenous Q12H  . vancomycin  750 mg Intravenous Q T,Th,Sa-HD  . verapamil  120 mg Oral Q8H    I have seen and examined this patient and agree with plan and assessment in the above note with renal recommendations/intervention highlighted. Still trying to ascertain etiology of AMS.  Continue with IHD on schedule for now.  If his mental status does not improve, transition to comfort measures and palliative care consult would be very appropriate. Governor Rooks Merinda Victorino,MD 12/05/2015 12:56 PM

## 2015-12-05 NOTE — Progress Notes (Signed)
Patient is currently responding minimally to painful stimuli, this is a significant change from previous assessments.  Dr. Carles Collet updated on patients change in conditions, new orders placed at this time.  Will continue to monitor.

## 2015-12-05 NOTE — Progress Notes (Signed)
Pharmacy Antibiotic Note [name] is a 60 y.o. male admitted on 12/03/2015 with fever on uknown origin. Currently on day 2 of Cefepime and vancomycin.   Plan: -Continue Vancomycin 750 mg IV qHD-Tues/Thurs/Sat -Cefepime 2g IV days-Tues/Thurs/Sat -Trend WBC, temp, HD schedule -Drug levels as indicated   Temp (24hrs), Avg:98.9 F (37.2 C), Min:98.4 F (36.9 C), Max:100.1 F (37.8 C)   Recent Labs Lab 12/03/15 1450 12/03/15 1511 12/03/15 2101 12/04/15 0737 12/05/15 0417  WBC 6.0  --   --  6.6 6.2  CREATININE 5.25*  --   --  5.94*  --   LATICACIDVEN  --  0.39* 0.38*  --   --     Estimated Creatinine Clearance: 11.7 mL/min (by C-G formula based on SCr of 5.94 mg/dL).    Allergies  Allergen Reactions  . Reglan [Metoclopramide] Other (See Comments)    Tardive dyskinesia in 11/2011 in [place]   Antibiotics:  8/17 Cefepime >> 8/17 Vancomycin >>    Microbiology  8/16 BCx: ngtd 8/17 MRSA PCR: neg   Vincenza Hews, PharmD, BCPS 12/05/2015, 9:47 AM Pager: [phone]

## 2015-12-05 NOTE — Progress Notes (Signed)
@  [id number] paged K. Sofia of [facility] regarding pt's increasing lethargy throughout night (at 11pm confused but responsive to verbal stimulation and now only responsive to painful stimuli). Pt follows simple commands but falls back asleep immediately. Grips equal tongue midline, and no focal deficits noted in any extremities. Paged retuned @0610 . No orders received but will convey to day team.

## 2015-12-05 NOTE — Consult Note (Signed)
Daily Rounding Note  12/05/2015, 9:42 AM  LOS: 2 days   SUBJECTIVE:   Chief complaint: bloody stool Large, non-bloody stool last night.  Is obtunded this am and blood sugar as low as 50.  OBJECTIVE:         Vital signs in last 24 hours:    Temp:  [98.4 F (36.9 C)-100.1 F (37.8 C)] 98.8 F (37.1 C) (08/18 0327) Pulse Rate:  [66-80] 76 (08/18 0735) Resp:  [13-22] 17 (08/18 0735) BP: (157-224)/(65-98) 187/74 (08/18 0735) SpO2:  [95 %-100 %] 100 % (08/18 0735) Weight:  [61.2 kg (134 lb 14.7 oz)-63.4 kg (139 lb 12.4 oz)] 61.2 kg (134 lb 14.7 oz) (08/17 1827) Last BM Date: 12/03/15 Filed Weights   12/04/15 0700 12/04/15 1430 12/04/15 1827  Weight: 60.8 kg (134 lb 0.6 oz) 63.4 kg (139 lb 12.4 oz) 61.2 kg (134 lb 14.7 oz)   General: obtunded.  Not anxious or restless   Heart: RRR Chest: clear bil.  No cough or dyspnea Abdomen: NT, ND.  Active BS  Extremities: no CCE.  Right BKA Neuro/Psych:  Not even opening eyes to loud voice.  Deeply obtunded.   Intake/Output from previous day: 08/17 0701 - 08/18 0700 In: 200 [IV Piggyback:200] Out: 1500   Intake/Output this shift: No intake/output data recorded.  Lab Results:  Recent Labs  12/03/15 1450 12/04/15 0737 12/05/15 0417  WBC 6.0 6.6 6.2  HGB 2.3* 9.3* 10.0*  HCT 7.7* 29.1* 32.0*  PLT 258 183 193   BMET  Recent Labs  12/03/15 1450 12/04/15 0737  NA 137 136  K 4.3 4.3  CL 99* 98*  CO2 31 28  GLUCOSE 59* 50*  BUN 21* 24*  CREATININE 5.25* 5.94*  CALCIUM 8.5* 8.6*   LFT  Recent Labs  12/03/15 1450 12/04/15 0737  PROT 5.4* 5.6*  ALBUMIN 1.9* 2.0*  AST 18 19  ALT 10* 10*  ALKPHOS 62 59  BILITOT 0.7 1.2   PT/INR  Recent Labs  12/03/15 1450  LABPROT 15.6*  INR 1.23   Hepatitis Panel No results for input(s): HEPBSAG, HCVAB, HEPAIGM, HEPBIGM in the last 72 hours.  Studies/Results: Ct Head Wo Contrast  Result Date:  12/05/2015 CLINICAL DATA:  Altered mental status.  Chronic renal failure EXAM: CT HEAD WITHOUT CONTRAST TECHNIQUE: Contiguous axial images were obtained from the base of the skull through the vertex without intravenous contrast. COMPARISON:  February 01, 2014 FINDINGS: Brain: Ventricles and sulci are within normal limits for age. There is no intracranial mass, hemorrhage, extra-axial fluid collection, or midline shift. There is patchy small vessel disease in the centra semiovale bilaterally, most notably in the anterior centra semiovale bilaterally, stable. Small vessel disease is also noted in the mid pons in the basilar perforator distribution. Elsewhere gray-white compartments appear normal. No acute infarct evident. Vascular: There is no hyperdense vessel. There are foci of calcification in each carotid siphon and distal vertebral artery region. Skull: The bony calvarium appears intact. Sinuses/Orbits: Orbits appear symmetric bilaterally. Visualized paranasal sinuses are clear. Other: Mastoid air cells are clear. IMPRESSION: Stable supratentorial infratentorial small vessel disease. No acute infarct evident. No hemorrhage or mass effect. Foci of arterial vascular calcification noted. Electronically Signed   By: Lowella Grip III M.D.   On: 12/05/2015 09:06   Dg Chest Port 1 View  Result Date: 12/03/2015 CLINICAL DATA:  Fever and fatigue. EXAM: PORTABLE CHEST 1 VIEW COMPARISON:  08/16/2015 FINDINGS: 1514 hours. Lungs  are hyperexpanded. The lungs are clear wiithout focal pneumonia, edema, pneumothorax or pleural effusion. The cardio pericardial silhouette is enlarged. The visualized bony structures of the thorax are intact. Degenerative changes are seen in the glenohumeral joints bilaterally. IMPRESSION: No active disease. Electronically Signed   By: Misty Stanley M.D.   On: 12/03/2015 15:28   Scheduled Meds: . antiseptic oral rinse  7 mL Mouth Rinse BID  . atorvastatin  40 mg Oral QHS  . calcium  acetate  667 mg Oral TID WC  . ceFEPime (MAXIPIME) IV  2 g Intravenous Q T,Th,Sat-1800  . darbepoetin (ARANESP) injection - DIALYSIS  150 mcg Intravenous Q Thu-HD  . dextrose      . doxercalciferol  7 mcg Intravenous Q T,Th,Sa-HD  . hydrALAZINE  5 mg Intravenous Q8H  . labetalol  10 mg Intravenous Q6H  . levETIRAcetam  500 mg Intravenous BID  . losartan  100 mg Oral QPM  . multivitamin  1 tablet Oral QHS  . pantoprazole  40 mg Oral BID  . polyethylene glycol  17 g Oral Daily  . saccharomyces boulardii  250 mg Oral Daily  . senna-docusate  1 tablet Oral BID  . sertraline  25 mg Oral Daily  . sodium chloride flush  3 mL Intravenous Q12H  . vancomycin  750 mg Intravenous Q T,Th,Sa-HD  . verapamil  120 mg Oral Q8H   Continuous Infusions:  PRN Meds:.acetaminophen **OR** acetaminophen, hydrALAZINE, ondansetron **OR** ondansetron (ZOFRAN) IV   ASSESMENT:   *  GI bleed with painless hematochezia.  Stool burgundy red on rectal.  Large, non-bloody stool last night.  No abdominal tenderness ? Lower vs upper GI bleed.   Extensive previous endoscopic studies for overt and occult GI bleeding: HH, gastritis, adenomatous colon polyp.     *  Acute on chronic anemia. ? Of abnormal B cell population on previous bone marrow biopsy. S/p PRBC x 4.    *  Hep C +.  In substance abuser   *  Hypertension.     *  ESRD.    *  Fever.  No recurrence.  Blood clxs no growth.  On Vanc/cefepime   *  Encephalopathy, severe.  Multifactorial.  Not clear what is his baseline and suspect baseline cognitive impairment.  Head CT: chronic small vessell disease.  Hydrocodone discontinued.    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    *  Protein malnutrition.    PLAN   *  EGD, Colonoscopy.  When stable.  Will recheck pt early next week  *  Might want to involve palliative care.  Last consult I find with PC is from 01/2014.         Azucena Freed  12/05/2015, 9:42 AM Pager: [phone]  I have discussed the  case with the PA, and that is the plan I formulated. I personally interviewed and examined the patient.  [name] stuporous this AM, worsened mental status compared to yesterday. He opens eyes a little and swats my hand away with stimulation.  He also scratches face, and gross motor function of arms appears normal and symmetrical.  [name] breathing well, VS normal.  There has been no further overt GI bleeding since the day of admission.  This patient does not have ongoing GI bleeding, his Hgb rose well with tfx, and he had HD yesterday.   His poor mental status has not lear explanation at present. CT head neg, EEG and ABG done.  I have no  current plans for endoscopic procedures in this patient.  We will sign off, please re-consult [place] as needed. Nurse plans to get medicine service to re-evaluate patient later today and at least get a tox screen.    Nelida Meuse III Pager [phone]  Mon-Fri 8a-5p [phone] after 5p, weekends, holidays

## 2015-12-05 NOTE — Progress Notes (Signed)
Hypoglycemic Event  CBG: 66  Treatment: D50 IV 50 mL  Symptoms: Lethargic  Follow-up CBG: [id number] CBG Result: 139  Possible Reasons for Event: Inadequate meal intake and Unknown  Comments/MD notified:K Encinal of [facility]    Wilder, Jerrye Noble

## 2015-12-05 NOTE — Progress Notes (Signed)
PROGRESS NOTE  [name] [id number] DOB: [birth year] DOA: 12/03/2015 PCP: No primary care provider on file.  Brief History:  60 y.o.malewith medical history of ESRD, hypertension, seizure disorder, diastolic CHF, polysubstance abuse including cocaine and tobacco presented with 3 episodes of hematochezia at his nursing facility on 12/03/2015. Notably, the patient was recently discharged from [facility] after a stay from 8/1-8/95for sepsis secondary to necrotic right foot. 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 During the hospitalization, the patient also developed respiratory failure secondary to opioid therapy and improved with naloxone. In addition, the patient has had several upper endoscopies, enteroscopy, and colonoscopy. Most recently, the patient had an EGD in 2012showing small hiatus hernia and colonoscopy in May 2012 with small tubular and normal. Enteroscopy in December 2012 was unremarkable. Capsule endoscopy in May 2015 was unremarkable. On the morning of 12/05/2015, the patient was more encephalopathic but arousable and followed one-step commands. Assessment/Plan: Acute blood loss anemia -Secondary to likely lower GI bleed -transfused 4 units PRBC -INR 1.23 -PTT 43 -protonix bid -clear liquid diet for now -am CBC  Hematochezia -appreciate GI consult -Hgb has remained stable after transfusion -hold ASA -plans noted for possible colonoscopy and EGD  Acute Encephalopathy -mulitfactorial including recent illicit substance use, HTN encephalopathy, and profound anemia from ABLA -12/05/2015--more confused although arousable and followed one-step commands -Suspect a component of hypertensive encephalopathy and possibly PRES -EEG -12/05/15--CT brain--no acute findings -8/18--ABG--7.38/49/122/29 on 2L -ammonia, B12 -suspect he may have some baseline cognitive impairment -pt's family not involved in his life  Fever -rectal temp on 99.9 in  ED; EDP reports temp of 102 at SNF -CXR--no infiltrates -pt is anuric -Blood cultures x 2--neg to date -empiric vanc and cefepime pending cultures -R-stump does not look infected on exam  HTN urgency -Continue hydralazine, losartan, verapamil -change hydralazine and labetolol to IV -hydralazine prn BP > 180  Hypoglycemia -am cortisol   ESRD -Nephrology has been consulted for maintenance dialysis  Polysubstance abuse -pt claims he smoked cocaine 2 days prior to admission -continues to smoke tobacco -defers nicotine patch  Seizure disorder -continue Keppra  Hyperlipidemia -continue statin   Disposition Plan:   SNF 2-3 days  Family Communication:   Caretaker, Neoma Laming updated at bedside 12/04/15  Consultants:  [facility]  Code Status:  FULL   DVT Prophylaxis:  SCDs   Procedures: As Listed in Progress Note Above  Antibiotics: None    Subjective:  patient is confused but arouses and follows one-step commands. Denies any headache, chest pain, short of breath, abdominal pain. Remainder of review of systems unobtainable secondary to patient's encephalopathy   Objective: Vitals:   12/05/15 0545 12/05/15 0620 12/05/15 0640 12/05/15 0735  BP: (!) 188/72 (!) 192/84 (!) 186/80 (!) 187/74  Pulse: 76 76 76 76  Resp: 18 18 17 17   Temp:      TempSrc:      SpO2: 100% 100% 100% 100%  Weight:      Height:        Intake/Output Summary (Last 24 hours) at 12/05/15 0917 Last data filed at 12/04/15 2300  Gross per 24 hour  Intake              200 ml  Output             1500 ml  Net            -1300 ml   Weight change: 2.6 kg (5 lb  11.7 oz) Exam:   General:  Pt is alert, follows commands appropriately, not in acute distress  HEENT: No icterus, No thrush, No neck mass, [place]/AT  Cardiovascular: RRR, S1/S2, no rubs, no gallops  Respiratory: Fine bibasilar rales. Good air movement. No wheeze.   Abdomen: Soft/+BS, non tender, non distended, no  guarding  Extremities: No edema, No lymphangitis, No petechiae, No rashes, no synovitis   Data Reviewed: I have personally reviewed following labs and imaging studies Basic Metabolic Panel:  Recent Labs Lab 12/03/15 1450 12/04/15 0737  NA 137 136  K 4.3 4.3  CL 99* 98*  CO2 31 28  GLUCOSE 59* 50*  BUN 21* 24*  CREATININE 5.25* 5.94*  CALCIUM 8.5* 8.6*   Liver Function Tests:  Recent Labs Lab 12/03/15 1450 12/04/15 0737  AST 18 19  ALT 10* 10*  ALKPHOS 62 59  BILITOT 0.7 1.2  PROT 5.4* 5.6*  ALBUMIN 1.9* 2.0*   No results for input(s): LIPASE, AMYLASE in the last 168 hours. No results for input(s): AMMONIA in the last 168 hours. Coagulation Profile:  Recent Labs Lab 12/03/15 1450  INR 1.23   CBC:  Recent Labs Lab 12/03/15 1450 12/04/15 0737 12/05/15 0417  WBC 6.0 6.6 6.2  NEUTROABS 4.1  --   --   HGB 2.3* 9.3* 10.0*  HCT 7.7* 29.1* 32.0*  MCV 90.6 88.2 88.9  PLT 258 183 193   Cardiac Enzymes: No results for input(s): CKTOTAL, CKMB, CKMBINDEX, TROPONINI in the last 168 hours. BNP: Invalid input(s): POCBNP CBG:  Recent Labs Lab 12/05/15 0325 12/05/15 0357 12/05/15 0545 12/05/15 0736 12/05/15 0914  GLUCAP 66 139* 80 75 75   HbA1C: No results for input(s): HGBA1C in the last 72 hours. Urine analysis:    Component Value Date/Time   COLORURINE YELLOW 11/07/2012 1903   APPEARANCEUR CLEAR 11/07/2012 1903   LABSPEC 1.012 11/07/2012 1903   PHURINE 8.5 (H) 11/07/2012 1903   GLUCOSEU 250 (A) 11/07/2012 1903   HGBUR NEGATIVE 11/07/2012 1903   BILIRUBINUR NEGATIVE 11/07/2012 1903   KETONESUR NEGATIVE 11/07/2012 1903   PROTEINUR >300 (A) 11/07/2012 1903   UROBILINOGEN 0.2 11/07/2012 1903   NITRITE NEGATIVE 11/07/2012 1903   LEUKOCYTESUR NEGATIVE 11/07/2012 1903   Sepsis Labs: @LABRCNTIP (procalcitonin:4,lacticidven:4) ) Recent Results (from the past 240 hour(s))  Blood Culture (routine x 2)     Status: None (Preliminary result)    Collection Time: 12/03/15  2:50 PM  Result Value Ref Range Status   Specimen Description BLOOD BLOOD RIGHT FOREARM  Final   Special Requests BOTTLES DRAWN AEROBIC AND ANAEROBIC 5ML  Final   Culture NO GROWTH < 24 HOURS  Final   Report Status PENDING  Incomplete  Blood Culture (routine x 2)     Status: None (Preliminary result)   Collection Time: 12/03/15  3:18 PM  Result Value Ref Range Status   Specimen Description BLOOD LEFT HAND  Final   Special Requests BOTTLES DRAWN AEROBIC AND ANAEROBIC 5CC  Final   Culture NO GROWTH < 24 HOURS  Final   Report Status PENDING  Incomplete  MRSA PCR Screening     Status: None   Collection Time: 12/04/15 12:52 AM  Result Value Ref Range Status   MRSA by PCR NEGATIVE NEGATIVE Final    Comment:        The GeneXpert MRSA Assay (FDA approved for NASAL specimens only), is one component of a comprehensive MRSA colonization surveillance program. It is not intended to diagnose MRSA infection nor to  guide or monitor treatment for MRSA infections.      Scheduled Meds: . antiseptic oral rinse  7 mL Mouth Rinse BID  . atorvastatin  40 mg Oral QHS  . calcium acetate  667 mg Oral TID WC  . ceFEPime (MAXIPIME) IV  2 g Intravenous Q T,Th,Sat-1800  . darbepoetin (ARANESP) injection - DIALYSIS  150 mcg Intravenous Q Thu-HD  . dextrose      . doxercalciferol  7 mcg Intravenous Q T,Th,Sa-HD  . hydrALAZINE  5 mg Intravenous Q8H  . labetalol  10 mg Intravenous Q6H  . levETIRAcetam  500 mg Intravenous BID  . losartan  100 mg Oral QPM  . multivitamin  1 tablet Oral QHS  . pantoprazole  40 mg Oral BID  . polyethylene glycol  17 g Oral Daily  . saccharomyces boulardii  250 mg Oral Daily  . senna-docusate  1 tablet Oral BID  . sertraline  25 mg Oral Daily  . sodium chloride flush  3 mL Intravenous Q12H  . vancomycin  750 mg Intravenous Q T,Th,Sa-HD  . verapamil  120 mg Oral Q8H   Continuous Infusions:   Procedures/Studies: Ct Head Wo Contrast  Result  Date: 12/05/2015 CLINICAL DATA:  Altered mental status.  Chronic renal failure EXAM: CT HEAD WITHOUT CONTRAST TECHNIQUE: Contiguous axial images were obtained from the base of the skull through the vertex without intravenous contrast. COMPARISON:  February 01, 2014 FINDINGS: Brain: Ventricles and sulci are within normal limits for age. There is no intracranial mass, hemorrhage, extra-axial fluid collection, or midline shift. There is patchy small vessel disease in the centra semiovale bilaterally, most notably in the anterior centra semiovale bilaterally, stable. Small vessel disease is also noted in the mid pons in the basilar perforator distribution. Elsewhere gray-white compartments appear normal. No acute infarct evident. Vascular: There is no hyperdense vessel. There are foci of calcification in each carotid siphon and distal vertebral artery region. Skull: The bony calvarium appears intact. Sinuses/Orbits: Orbits appear symmetric bilaterally. Visualized paranasal sinuses are clear. Other: Mastoid air cells are clear. IMPRESSION: Stable supratentorial infratentorial small vessel disease. No acute infarct evident. No hemorrhage or mass effect. Foci of arterial vascular calcification noted. Electronically Signed   By: Lowella Grip III M.D.   On: 12/05/2015 09:06   Dg Chest Port 1 View  Result Date: 12/03/2015 CLINICAL DATA:  Fever and fatigue. EXAM: PORTABLE CHEST 1 VIEW COMPARISON:  08/16/2015 FINDINGS: 1514 hours. Lungs are hyperexpanded. The lungs are clear wiithout focal pneumonia, edema, pneumothorax or pleural effusion. The cardio pericardial silhouette is enlarged. The visualized bony structures of the thorax are intact. Degenerative changes are seen in the glenohumeral joints bilaterally. IMPRESSION: No active disease. Electronically Signed   By: Misty Stanley M.D.   On: 12/03/2015 15:28    Memory Heinrichs, DO  [facility] Pager [phone]  If 7PM-7AM, please contact  night-coverage [email or web address] Password [email or web address] 12/05/2015, 9:17 AM   LOS: 2 days

## 2015-12-05 NOTE — Progress Notes (Signed)
EEG completed, results pending. 

## 2015-12-05 NOTE — Progress Notes (Signed)
Pt's BP elevated despite PRNs (SBP 180s-190s). Brandon Melnick of [place] paged. No orders received. Will continue to monitor.

## 2015-12-06 LAB — RENAL FUNCTION PANEL
ALBUMIN: 1.8 g/dL — AB (ref 3.5–5.0)
Anion gap: 7 (ref 5–15)
BUN: 14 mg/dL (ref 6–20)
CALCIUM: 8.7 mg/dL — AB (ref 8.9–10.3)
CO2: 28 mmol/L (ref 22–32)
CREATININE: 5.51 mg/dL — AB (ref 0.61–1.24)
Chloride: 99 mmol/L — ABNORMAL LOW (ref 101–111)
GFR, EST AFRICAN AMERICAN: 12 mL/min — AB (ref >60)
GFR, EST NON AFRICAN AMERICAN: 10 mL/min — AB (ref >60)
Glucose, Bld: 98 mg/dL (ref 65–99)
PHOSPHORUS: 5.3 mg/dL — AB (ref 2.5–4.6)
Potassium: 4 mmol/L (ref 3.5–5.1)
Sodium: 134 mmol/L — ABNORMAL LOW (ref 135–145)

## 2015-12-06 LAB — GLUCOSE, CAPILLARY
GLUCOSE-CAPILLARY: 84 mg/dL (ref 65–99)
GLUCOSE-CAPILLARY: 121 mg/dL — AB (ref 65–99)
GLUCOSE-CAPILLARY: 86 mg/dL (ref 65–99)
GLUCOSE-CAPILLARY: 104 mg/dL — AB (ref 65–99)
Glucose-Capillary: 110 mg/dL — ABNORMAL HIGH (ref 65–99)
Glucose-Capillary: 130 mg/dL — ABNORMAL HIGH (ref 65–99)
Glucose-Capillary: 80 mg/dL (ref 65–99)
Glucose-Capillary: 85 mg/dL (ref 65–99)
Glucose-Capillary: 86 mg/dL (ref 65–99)
Glucose-Capillary: 90 mg/dL (ref 65–99)

## 2015-12-06 LAB — CBC
HCT: 31.4 % — ABNORMAL LOW (ref 39.0–52.0)
Hemoglobin: 9.7 g/dL — ABNORMAL LOW (ref 13.0–17.0)
MCH: 28 pg (ref 26.0–34.0)
MCHC: 30.9 g/dL (ref 30.0–36.0)
MCV: 90.5 fL (ref 78.0–100.0)
PLATELETS: 175 10*3/uL (ref 150–400)
RBC: 3.47 MIL/uL — AB (ref 4.22–5.81)
RDW: 16.4 % — AB (ref 11.5–15.5)
WBC: 5.9 10*3/uL (ref 4.0–10.5)

## 2015-12-06 MED ORDER — LABETALOL HCL 100 MG PO TABS
100.0000 mg | ORAL_TABLET | Freq: Three times a day (TID) | ORAL | Status: DC
  Administered 2015-12-06 – 2015-12-08 (×8): 100 mg via ORAL
  Filled 2015-12-06 (×8): qty 1

## 2015-12-06 MED ORDER — LEVETIRACETAM 500 MG PO TABS
500.0000 mg | ORAL_TABLET | Freq: Two times a day (BID) | ORAL | Status: DC
  Administered 2015-12-06 – 2015-12-08 (×5): 500 mg via ORAL
  Filled 2015-12-06 (×5): qty 1

## 2015-12-06 MED ORDER — HYDRALAZINE HCL 50 MG PO TABS
50.0000 mg | ORAL_TABLET | Freq: Two times a day (BID) | ORAL | Status: DC
  Administered 2015-12-06 – 2015-12-07 (×2): 50 mg via ORAL
  Filled 2015-12-06 (×2): qty 1

## 2015-12-06 MED ORDER — VANCOMYCIN HCL IN DEXTROSE 750-5 MG/150ML-% IV SOLN
INTRAVENOUS | Status: AC
  Administered 2015-12-06: 750 mg via INTRAVENOUS
  Filled 2015-12-06: qty 150

## 2015-12-06 MED ORDER — SODIUM CHLORIDE 0.9 % IV SOLN: 100.0000 mL | INTRAVENOUS | Status: DC | PRN

## 2015-12-06 MED ORDER — PENTAFLUOROPROP-TETRAFLUOROETH EX AERO: 1.0000 "application " | INHALATION_SPRAY | CUTANEOUS | Status: DC | PRN

## 2015-12-06 MED ORDER — DOXERCALCIFEROL 4 MCG/2ML IV SOLN
INTRAVENOUS | Status: AC
  Administered 2015-12-06: 7 ug via INTRAVENOUS
  Filled 2015-12-06: qty 4

## 2015-12-06 MED ORDER — LIDOCAINE-PRILOCAINE 2.5-2.5 % EX CREA: 1.0000 "application " | TOPICAL_CREAM | CUTANEOUS | Status: DC | PRN

## 2015-12-06 MED ORDER — LIDOCAINE HCL (PF) 1 % IJ SOLN: 5.0000 mL | INTRAMUSCULAR | Status: DC | PRN

## 2015-12-06 NOTE — Progress Notes (Signed)
Gave report to 6E

## 2015-12-06 NOTE — Progress Notes (Signed)
PROGRESS NOTE  [name] [id number] DOB: 1955-11-22 DOA: 12/03/2015 PCP: No primary care provider on file.  Brief History:  60 y.o.malewith medical history of ESRD, hypertension, seizure disorder, diastolic CHF, polysubstance abuse including cocaine and tobacco presented with 3 episodes of hematochezia at his nursing facility on 12/03/2015. Notably, the patient was recently discharged from [facility] after a stay from 8/1-8/29for sepsis secondary to necrotic right foot. 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 During the hospitalization, the patient also developed respiratory failure secondary to opioid therapy and improved with naloxone. In addition, the patient has had several upper endoscopies, enteroscopy, and colonoscopy. Most recently, the patient had an EGD in 2012showing small hiatus hernia and colonoscopy in May 2012 with small tubular and normal. Enteroscopy in December 2012 was unremarkable. Capsule endoscopy in May 2015 was unremarkable.  Assessment/Plan: Acute blood loss anemia -Secondary to likely lower GI bleed -transfused4 units PRBC-->Hgb remains stable -INR 1.23 -PTT 43 -protonix bid -advance diet -am CBC  Hematochezia -appreciate GI consult -Hgb has remained stable after transfusion -hold ASA -No plans for colonoscopy and EGD at this point with stable Hgb and previous work up -continue to trend CBC  Acute Encephalopathy -mulitfactorial including recent illicit substance use, HTN encephalopathy, and profound anemia from ABLA with greatest contribution likely due to HTN encephalopathy -Episodes of worsening confusion seem to correlate when BP is at its highest -EEG--diffuse cerebral dysfunction -12/05/15--CT brain--no acute findings -8/18--ABG--7.38/49/122/29 on 2L -ammonia, B12--unremarkable -suspect he may have some baseline cognitive impairment as indicated by his advocate, Neoma Laming -pt's family not involved in his  life -12/06/15--much more alert, near baseline  Fever -rectal temp on 99.9 in ED; EDP reports temp of 102 at SNF -CXR--no infiltrates -pt is anuric -Blood cultures x 2--neg to date -empiric vanc and cefepime-->d/c on 8/19 -R-stump does not look infected on exam  HTN urgency -Continue hydralazine, losartan, verapamil -change hydralazine and labetolol to IV -hydralazine prn BP > 180  Hypoglycemia -am cortisol--7.5 -in part due to poor po intake  ESRD -Appreciate nephrology  Polysubstance abuse -pt claims he smoked cocaine 2 days prior to admission -continues to smoke tobacco -defers nicotine patch  Seizure disorder -continue Keppra  Hyperlipidemia -continue statin   Disposition Plan: SNF 1-2 days  Family Communication: Silva Bandy updatedat bedside 12/04/15  Consultants: [facility]  Code Status: FULL   DVT Prophylaxis: SCDs   Procedures: As Listed in Progress Note Above  Antibiotics: None   Subjective: Patient denies fevers, chills, headache, chest pain, dyspnea, nausea, vomiting, diarrhea, abdominal pain, dysuria, hematuria, hematochezia, and melena.   Objective: Vitals:   12/06/15 1212 12/06/15 1230 12/06/15 1242 12/06/15 1500  BP: (!) 165/93 (!) 164/70 (!) 170/70   Pulse: 67 67 68   Resp: 14 (!) 23 15   Temp:   98.2 F (36.8 C) 98.9 F (37.2 C)  TempSrc:   Oral Axillary  SpO2:   98%   Weight:   59.7 kg (131 lb 9.8 oz)   Height:        Intake/Output Summary (Last 24 hours) at 12/06/15 1518 Last data filed at 12/06/15 1242  Gross per 24 hour  Intake           797.08 ml  Output             1000 ml  Net          -202.92 ml   Weight change:  Exam:   General:  Pt is alert, follows commands appropriately, not in acute distress  HEENT: No icterus, No thrush, No neck mass, [place]/AT  Cardiovascular: RRR, S1/S2, no rubs, no gallops  Respiratory: bibasilar crackles, no wheeze.  Good air movement  Abdomen:  Soft/+BS, non tender, non distended, no guarding  Extremities: No edema, No lymphangitis, No petechiae, No rashes, no synovitis   Data Reviewed: I have personally reviewed following labs and imaging studies Basic Metabolic Panel:  Recent Labs Lab 12/03/15 1450 12/04/15 0737 12/05/15 0948 12/06/15 0918  NA 137 136 137 134*  K 4.3 4.3 3.6 4.0  CL 99* 98* 101 99*  CO2 31 28 28 28   GLUCOSE 59* 50* 77 98  BUN 21* 24* 10 14  CREATININE 5.25* 5.94* 4.18* 5.51*  CALCIUM 8.5* 8.6* 8.9 8.7*  PHOS  --   --   --  5.3*   Liver Function Tests:  Recent Labs Lab 12/03/15 1450 12/04/15 0737 12/05/15 0948 12/06/15 0918  AST 18 19 17   --   ALT 10* 10* 11*  --   ALKPHOS 62 59 68  --   BILITOT 0.7 1.2 0.8  --   PROT 5.4* 5.6* 6.1*  --   ALBUMIN 1.9* 2.0* 2.1* 1.8*   No results for input(s): LIPASE, AMYLASE in the last 168 hours.  Recent Labs Lab 12/05/15 0948  AMMONIA 30   Coagulation Profile:  Recent Labs Lab 12/03/15 1450  INR 1.23   CBC:  Recent Labs Lab 12/03/15 1450 12/04/15 0737 12/05/15 0417 12/06/15 0918  WBC 6.0 6.6 6.2 5.9  NEUTROABS 4.1  --   --   --   HGB 2.3* 9.3* 10.0* 9.7*  HCT 7.7* 29.1* 32.0* 31.4*  MCV 90.6 88.2 88.9 90.5  PLT 258 183 193 175   Cardiac Enzymes: No results for input(s): CKTOTAL, CKMB, CKMBINDEX, TROPONINI in the last 168 hours. BNP: Invalid input(s): POCBNP CBG:  Recent Labs Lab 12/06/15 0253 12/06/15 0446 12/06/15 0806 12/06/15 1055 12/06/15 1310  GLUCAP 85 86 110* 84 121*   HbA1C: No results for input(s): HGBA1C in the last 72 hours. Urine analysis:    Component Value Date/Time   COLORURINE YELLOW 11/07/2012 1903   APPEARANCEUR CLEAR 11/07/2012 1903   LABSPEC 1.012 11/07/2012 1903   PHURINE 8.5 (H) 11/07/2012 1903   GLUCOSEU 250 (A) 11/07/2012 1903   HGBUR NEGATIVE 11/07/2012 1903   BILIRUBINUR NEGATIVE 11/07/2012 1903   KETONESUR NEGATIVE 11/07/2012 1903   PROTEINUR >300 (A) 11/07/2012 1903    UROBILINOGEN 0.2 11/07/2012 1903   NITRITE NEGATIVE 11/07/2012 1903   LEUKOCYTESUR NEGATIVE 11/07/2012 1903   Sepsis Labs: @LABRCNTIP (procalcitonin:4,lacticidven:4) ) Recent Results (from the past 240 hour(s))  Blood Culture (routine x 2)     Status: None (Preliminary result)   Collection Time: 12/03/15  2:50 PM  Result Value Ref Range Status   Specimen Description BLOOD BLOOD RIGHT FOREARM  Final   Special Requests BOTTLES DRAWN AEROBIC AND ANAEROBIC 5ML  Final   Culture NO GROWTH 3 DAYS  Final   Report Status PENDING  Incomplete  Blood Culture (routine x 2)     Status: None (Preliminary result)   Collection Time: 12/03/15  3:18 PM  Result Value Ref Range Status   Specimen Description BLOOD LEFT HAND  Final   Special Requests BOTTLES DRAWN AEROBIC AND ANAEROBIC 5CC  Final   Culture NO GROWTH 3 DAYS  Final   Report Status PENDING  Incomplete  MRSA PCR Screening     Status: None   Collection  Time: 12/04/15 12:52 AM  Result Value Ref Range Status   MRSA by PCR NEGATIVE NEGATIVE Final    Comment:        The GeneXpert MRSA Assay (FDA approved for NASAL specimens only), is one component of a comprehensive MRSA colonization surveillance program. It is not intended to diagnose MRSA infection nor to guide or monitor treatment for MRSA infections.      Scheduled Meds: . antiseptic oral rinse  7 mL Mouth Rinse BID  . atorvastatin  40 mg Oral QHS  . calcium acetate  667 mg Oral TID WC  . ceFEPime (MAXIPIME) IV  2 g Intravenous Q T,Th,Sat-1800  . darbepoetin (ARANESP) injection - DIALYSIS  150 mcg Intravenous Q Thu-HD  . doxercalciferol  7 mcg Intravenous Q T,Th,Sa-HD  . feeding supplement (PRO-STAT SUGAR FREE 64)  30 mL Oral BID  . hydrALAZINE  5 mg Intravenous Q8H  . labetalol  10 mg Intravenous Q6H  . levETIRAcetam  500 mg Intravenous BID  . losartan  100 mg Oral QPM  . multivitamin  1 tablet Oral QHS  . pantoprazole  40 mg Oral BID  . polyethylene glycol  17 g Oral Daily   . saccharomyces boulardii  250 mg Oral Daily  . senna-docusate  1 tablet Oral BID  . sertraline  25 mg Oral Daily  . sodium chloride flush  3 mL Intravenous Q12H  . vancomycin  750 mg Intravenous Q T,Th,Sa-HD  . verapamil  120 mg Oral Q8H   Continuous Infusions: . dextrose 25 mL/hr at 12/05/15 1139    Procedures/Studies: Ct Head Wo Contrast  Result Date: 12/05/2015 CLINICAL DATA:  Altered mental status.  Chronic renal failure EXAM: CT HEAD WITHOUT CONTRAST TECHNIQUE: Contiguous axial images were obtained from the base of the skull through the vertex without intravenous contrast. COMPARISON:  February 01, 2014 FINDINGS: Brain: Ventricles and sulci are within normal limits for age. There is no intracranial mass, hemorrhage, extra-axial fluid collection, or midline shift. There is patchy small vessel disease in the centra semiovale bilaterally, most notably in the anterior centra semiovale bilaterally, stable. Small vessel disease is also noted in the mid pons in the basilar perforator distribution. Elsewhere gray-white compartments appear normal. No acute infarct evident. Vascular: There is no hyperdense vessel. There are foci of calcification in each carotid siphon and distal vertebral artery region. Skull: The bony calvarium appears intact. Sinuses/Orbits: Orbits appear symmetric bilaterally. Visualized paranasal sinuses are clear. Other: Mastoid air cells are clear. IMPRESSION: Stable supratentorial infratentorial small vessel disease. No acute infarct evident. No hemorrhage or mass effect. Foci of arterial vascular calcification noted. Electronically Signed   By: Lowella Grip III M.D.   On: 12/05/2015 09:06   Dg Chest Port 1 View  Result Date: 12/03/2015 CLINICAL DATA:  Fever and fatigue. EXAM: PORTABLE CHEST 1 VIEW COMPARISON:  08/16/2015 FINDINGS: 1514 hours. Lungs are hyperexpanded. The lungs are clear wiithout focal pneumonia, edema, pneumothorax or pleural effusion. The cardio  pericardial silhouette is enlarged. The visualized bony structures of the thorax are intact. Degenerative changes are seen in the glenohumeral joints bilaterally. IMPRESSION: No active disease. Electronically Signed   By: Misty Stanley M.D.   On: 12/03/2015 15:28    Ricca Melgarejo, DO  [facility] Pager [phone]  If 7PM-7AM, please contact night-coverage [email or web address] Password [email or web address] 12/06/2015, 3:18 PM   LOS: 3 days

## 2015-12-06 NOTE — Procedures (Signed)
I was present at this dialysis session. I have reviewed the session itself and made appropriate changes.  [name] remarkably regained consciousness and is alert, oriented to person and place.  His main c/o is "I'm hungry" but otherwise doing much better.  Filed Weights   12/04/15 0700 12/04/15 1430 12/04/15 1827  Weight: 60.8 kg (134 lb 0.6 oz) 63.4 kg (139 lb 12.4 oz) 61.2 kg (134 lb 14.7 oz)     Recent Labs Lab 12/05/15 0948  NA 137  K 3.6  CL 101  CO2 28  GLUCOSE 77  BUN 10  CREATININE 4.18*  CALCIUM 8.9     Recent Labs Lab 12/03/15 1450 12/04/15 0737 12/05/15 0417  WBC 6.0 6.6 6.2  NEUTROABS 4.1  --   --   HGB 2.3* 9.3* 10.0*  HCT 7.7* 29.1* 32.0*  MCV 90.6 88.2 88.9  PLT 258 183 193    Scheduled Meds: . antiseptic oral rinse  7 mL Mouth Rinse BID  . atorvastatin  40 mg Oral QHS  . calcium acetate  667 mg Oral TID WC  . ceFEPime (MAXIPIME) IV  2 g Intravenous Q T,Th,Sat-1800  . darbepoetin (ARANESP) injection - DIALYSIS  150 mcg Intravenous Q Thu-HD  . doxercalciferol  7 mcg Intravenous Q T,Th,Sa-HD  . feeding supplement (PRO-STAT SUGAR FREE 64)  30 mL Oral BID  . hydrALAZINE  5 mg Intravenous Q8H  . labetalol  10 mg Intravenous Q6H  . levETIRAcetam  500 mg Intravenous BID  . losartan  100 mg Oral QPM  . multivitamin  1 tablet Oral QHS  . pantoprazole  40 mg Oral BID  . polyethylene glycol  17 g Oral Daily  . saccharomyces boulardii  250 mg Oral Daily  . senna-docusate  1 tablet Oral BID  . sertraline  25 mg Oral Daily  . sodium chloride flush  3 mL Intravenous Q12H  . vancomycin  750 mg Intravenous Q T,Th,Sa-HD  . verapamil  120 mg Oral Q8H   Continuous Infusions: . dextrose 25 mL/hr at 12/05/15 1139   PRN Meds:.acetaminophen **OR** acetaminophen, hydrALAZINE, ondansetron **OR** ondansetron (ZOFRAN) IV    Donetta Potts,  MD 12/06/2015, 8:42 AM

## 2015-12-07 LAB — GLUCOSE, CAPILLARY
GLUCOSE-CAPILLARY: 121 mg/dL — AB (ref 65–99)
GLUCOSE-CAPILLARY: 97 mg/dL (ref 65–99)
GLUCOSE-CAPILLARY: 115 mg/dL — AB (ref 65–99)
GLUCOSE-CAPILLARY: 106 mg/dL — AB (ref 65–99)
Glucose-Capillary: 111 mg/dL — ABNORMAL HIGH (ref 65–99)

## 2015-12-07 LAB — CBC
HCT: 32.7 % — ABNORMAL LOW (ref 39.0–52.0)
Hemoglobin: 10 g/dL — ABNORMAL LOW (ref 13.0–17.0)
MCH: 27.8 pg (ref 26.0–34.0)
MCHC: 30.6 g/dL (ref 30.0–36.0)
MCV: 90.8 fL (ref 78.0–100.0)
Platelets: 164 10*3/uL (ref 150–400)
RBC: 3.6 MIL/uL — ABNORMAL LOW (ref 4.22–5.81)
RDW: 15.9 % — ABNORMAL HIGH (ref 11.5–15.5)
WBC: 5.5 10*3/uL (ref 4.0–10.5)

## 2015-12-07 MED ORDER — HYDRALAZINE HCL 50 MG PO TABS
100.0000 mg | ORAL_TABLET | Freq: Two times a day (BID) | ORAL | Status: DC
  Administered 2015-12-07 – 2015-12-08 (×2): 100 mg via ORAL
  Filled 2015-12-07 (×2): qty 2

## 2015-12-07 NOTE — Progress Notes (Signed)
PROGRESS NOTE  [name] [id number] DOB: [birth year] DOA: 12/03/2015 PCP: No primary care provider on file.  Brief History: 60 y.o.malewith medical history of ESRD, hypertension, seizure disorder, diastolic CHF, polysubstance abuse including cocaine and tobacco presented with 3 episodes of hematochezia at his nursing facility on 12/03/2015. Notably, the patient was recently discharged from [facility] after a stay from 8/1-8/72for sepsis secondary to necrotic right foot. 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 During the hospitalization, the patient also developed respiratory failure secondary to opioid therapy and improved with naloxone. In addition, the patient has had several upper endoscopies, enteroscopy, and colonoscopy. Most recently, the patient had an EGD in 2012showing small hiatus hernia and colonoscopy in May 2012 with small tubular and normal. Enteroscopy in December 2012 was unremarkable. Capsule endoscopy in May 2015 was unremarkable.  Assessment/Plan: Acute blood loss anemia -Secondary to likely lower GI bleed -transfused4 units PRBC-->Hgb remains stable -INR 1.23 -PTT 43 -protonix bid -advance diet-->tolerating dysphagia 3 -am CBC  Hematochezia -appreciate GI consult -Hgb has remained stable after transfusion -hold ASA -No plans for colonoscopy and EGD at this point with stable Hgb and previous work up -continue to trend CBC  Acute Encephalopathy -mulitfactorial including recent illicit substance use, HTN encephalopathy, and profound anemia from ABLA with greatest contribution likely due to HTN encephalopathy -Episodes of worsening confusion seem to correlate when BP is at its highest -EEG--diffuse cerebral dysfunction -12/05/15--CT brain--no acute findings -8/18--ABG--7.38/49/122/29 on 2L -ammonia, B12--unremarkable -suspect he may have some baseline cognitive impairment as indicated by his advocate, Neoma Laming -pt's family not  involved in his life -12/06/15--much more alert, near baseline -12/07/15--sleepy but awakens and answers appropriately  Fever -rectal temp on 99.9 in ED; EDP reports temp of 102 at SNF -CXR--no infiltrates -pt is anuric -Blood cultures x 2--neg to date -empiric vanc and cefepime-->d/c on 8/19 -R-stump does not look infected on exam  HTN urgency -Continue hydralazine, losartan, verapamil -increase hydralazine to 100 bid and continue labetalol -hydralazine prn BP > 180  Hypoglycemia -am cortisol--7.5 -in part due to poor po intake  ESRD -Appreciate nephrology -HD on TTS  Polysubstance abuse -pt claims he smoked cocaine 2 days prior to admission -continues to smoke tobacco -defers nicotine patch  Seizure disorder -continue Keppra  Hyperlipidemia -continue statin   Disposition Plan: SNF 12/08/15 Family Communication: Silva Bandy updatedat bedside 12/04/15  Consultants: [facility], nephrology  Code Status: FULL   DVT Prophylaxis: SCDs   Procedures: As Listed in Progress Note Above  Antibiotics: None    Subjective: Patient denies fevers, chills, headache, chest pain, dyspnea, nausea, vomiting, diarrhea, abdominal pain, dysuria, hematuria, hematochezia, and melena.   Objective: Vitals:   12/06/15 2040 12/07/15 0423 12/07/15 0930 12/07/15 1326  BP: (!) 185/66 (!) 192/61 (!) 127/51 (!) 147/69  Pulse:  73 61 67  Resp: 15 18 18    Temp: 98.7 F (37.1 C) 99.2 F (37.3 C) 99.2 F (37.3 C)   TempSrc: Axillary Oral Oral   SpO2: 98% 96% 94%   Weight:  60.4 kg (133 lb 2.5 oz)    Height:        Intake/Output Summary (Last 24 hours) at 12/07/15 1613 Last data filed at 12/07/15 1400  Gross per 24 hour  Intake             1050 ml  Output                0 ml  Net  1050 ml   Weight change:  Exam:   General:  Pt is alert, follows commands appropriately, not in acute distress  HEENT: No icterus, No thrush, No neck  mass, [place]/AT  Cardiovascular: RRR, S1/S2, no rubs, no gallops  Respiratory: Bibasilar rales. No wheezing. Good air movement.  Abdomen: Soft/+BS, non tender, non distended, no guarding  Extremities: No edema, No lymphangitis, No petechiae, No rashes, no synovitis   Data Reviewed: I have personally reviewed following labs and imaging studies Basic Metabolic Panel:  Recent Labs Lab 12/03/15 1450 12/04/15 0737 12/05/15 0948 12/06/15 0918  NA 137 136 137 134*  K 4.3 4.3 3.6 4.0  CL 99* 98* 101 99*  CO2 31 28 28 28   GLUCOSE 59* 50* 77 98  BUN 21* 24* 10 14  CREATININE 5.25* 5.94* 4.18* 5.51*  CALCIUM 8.5* 8.6* 8.9 8.7*  PHOS  --   --   --  5.3*   Liver Function Tests:  Recent Labs Lab 12/03/15 1450 12/04/15 0737 12/05/15 0948 12/06/15 0918  AST 18 19 17   --   ALT 10* 10* 11*  --   ALKPHOS 62 59 68  --   BILITOT 0.7 1.2 0.8  --   PROT 5.4* 5.6* 6.1*  --   ALBUMIN 1.9* 2.0* 2.1* 1.8*   No results for input(s): LIPASE, AMYLASE in the last 168 hours.  Recent Labs Lab 12/05/15 0948  AMMONIA 30   Coagulation Profile:  Recent Labs Lab 12/03/15 1450  INR 1.23   CBC:  Recent Labs Lab 12/03/15 1450 12/04/15 0737 12/05/15 0417 12/06/15 0918 12/07/15 0505  WBC 6.0 6.6 6.2 5.9 5.5  NEUTROABS 4.1  --   --   --   --   HGB 2.3* 9.3* 10.0* 9.7* 10.0*  HCT 7.7* 29.1* 32.0* 31.4* 32.7*  MCV 90.6 88.2 88.9 90.5 90.8  PLT 258 183 193 175 164   Cardiac Enzymes: No results for input(s): CKTOTAL, CKMB, CKMBINDEX, TROPONINI in the last 168 hours. BNP: Invalid input(s): POCBNP CBG:  Recent Labs Lab 12/06/15 2311 12/07/15 0123 12/07/15 0420 12/07/15 0741 12/07/15 1154  GLUCAP 104* 121* 111* 97 115*   HbA1C: No results for input(s): HGBA1C in the last 72 hours. Urine analysis:    Component Value Date/Time   COLORURINE YELLOW 11/07/2012 1903   APPEARANCEUR CLEAR 11/07/2012 1903   LABSPEC 1.012 11/07/2012 1903   PHURINE 8.5 (H) 11/07/2012 1903   GLUCOSEU  250 (A) 11/07/2012 1903   HGBUR NEGATIVE 11/07/2012 1903   BILIRUBINUR NEGATIVE 11/07/2012 1903   KETONESUR NEGATIVE 11/07/2012 1903   PROTEINUR >300 (A) 11/07/2012 1903   UROBILINOGEN 0.2 11/07/2012 1903   NITRITE NEGATIVE 11/07/2012 1903   LEUKOCYTESUR NEGATIVE 11/07/2012 1903   Sepsis Labs: @LABRCNTIP (procalcitonin:4,lacticidven:4) ) Recent Results (from the past 240 hour(s))  Blood Culture (routine x 2)     Status: None (Preliminary result)   Collection Time: 12/03/15  2:50 PM  Result Value Ref Range Status   Specimen Description BLOOD BLOOD RIGHT FOREARM  Final   Special Requests BOTTLES DRAWN AEROBIC AND ANAEROBIC 5ML  Final   Culture NO GROWTH 3 DAYS  Final   Report Status PENDING  Incomplete  Blood Culture (routine x 2)     Status: None (Preliminary result)   Collection Time: 12/03/15  3:18 PM  Result Value Ref Range Status   Specimen Description BLOOD LEFT HAND  Final   Special Requests BOTTLES DRAWN AEROBIC AND ANAEROBIC 5CC  Final   Culture NO GROWTH 3 DAYS  Final   Report Status PENDING  Incomplete  MRSA PCR Screening     Status: None   Collection Time: 12/04/15 12:52 AM  Result Value Ref Range Status   MRSA by PCR NEGATIVE NEGATIVE Final    Comment:        The GeneXpert MRSA Assay (FDA approved for NASAL specimens only), is one component of a comprehensive MRSA colonization surveillance program. It is not intended to diagnose MRSA infection nor to guide or monitor treatment for MRSA infections.      Scheduled Meds: . antiseptic oral rinse  7 mL Mouth Rinse BID  . atorvastatin  40 mg Oral QHS  . calcium acetate  667 mg Oral TID WC  . darbepoetin (ARANESP) injection - DIALYSIS  150 mcg Intravenous Q Thu-HD  . doxercalciferol  7 mcg Intravenous Q T,Th,Sa-HD  . feeding supplement (PRO-STAT SUGAR FREE 64)  30 mL Oral BID  . hydrALAZINE  100 mg Oral BID  . labetalol  100 mg Oral TID  . levETIRAcetam  500 mg Oral BID  . losartan  100 mg Oral QPM  .  multivitamin  1 tablet Oral QHS  . pantoprazole  40 mg Oral BID  . polyethylene glycol  17 g Oral Daily  . saccharomyces boulardii  250 mg Oral Daily  . senna-docusate  1 tablet Oral BID  . sertraline  25 mg Oral Daily  . sodium chloride flush  3 mL Intravenous Q12H  . verapamil  120 mg Oral Q8H   Continuous Infusions: . dextrose 25 mL/hr at 12/07/15 1051    Procedures/Studies: Ct Head Wo Contrast  Result Date: 12/05/2015 CLINICAL DATA:  Altered mental status.  Chronic renal failure EXAM: CT HEAD WITHOUT CONTRAST TECHNIQUE: Contiguous axial images were obtained from the base of the skull through the vertex without intravenous contrast. COMPARISON:  February 01, 2014 FINDINGS: Brain: Ventricles and sulci are within normal limits for age. There is no intracranial mass, hemorrhage, extra-axial fluid collection, or midline shift. There is patchy small vessel disease in the centra semiovale bilaterally, most notably in the anterior centra semiovale bilaterally, stable. Small vessel disease is also noted in the mid pons in the basilar perforator distribution. Elsewhere gray-white compartments appear normal. No acute infarct evident. Vascular: There is no hyperdense vessel. There are foci of calcification in each carotid siphon and distal vertebral artery region. Skull: The bony calvarium appears intact. Sinuses/Orbits: Orbits appear symmetric bilaterally. Visualized paranasal sinuses are clear. Other: Mastoid air cells are clear. IMPRESSION: Stable supratentorial infratentorial small vessel disease. No acute infarct evident. No hemorrhage or mass effect. Foci of arterial vascular calcification noted. Electronically Signed   By: Lowella Grip III M.D.   On: 12/05/2015 09:06   Dg Chest Port 1 View  Result Date: 12/03/2015 CLINICAL DATA:  Fever and fatigue. EXAM: PORTABLE CHEST 1 VIEW COMPARISON:  08/16/2015 FINDINGS: 1514 hours. Lungs are hyperexpanded. The lungs are clear wiithout focal pneumonia,  edema, pneumothorax or pleural effusion. The cardio pericardial silhouette is enlarged. The visualized bony structures of the thorax are intact. Degenerative changes are seen in the glenohumeral joints bilaterally. IMPRESSION: No active disease. Electronically Signed   By: Misty Stanley M.D.   On: 12/03/2015 15:28    Liddie Chichester, DO  [facility] Pager [phone]  If 7PM-7AM, please contact night-coverage [email or web address] Password [email or web address] 12/07/2015, 4:13 PM   LOS: 4 days

## 2015-12-07 NOTE — Progress Notes (Signed)
Patient ID: [name], male   DOB: 1955/12/22, 60 y.o.   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, no drainage, LAVF +T/B  Labs: BMET  Recent Labs Lab 12/03/15 1450 12/04/15 0737 12/05/15 0948 12/06/15 0918  NA 137 136 137 134*  K 4.3 4.3 3.6 4.0  CL 99* 98* 101 99*  CO2 31 28 28 28   GLUCOSE 59* 50* 77 98  BUN 21* 24* 10 14  CREATININE 5.25* 5.94* 4.18* 5.51*  ALBUMIN 1.9* 2.0* 2.1* 1.8*  CALCIUM 8.5* 8.6* 8.9 8.7*  PHOS  --   --   --  5.3*   CBC  Recent Labs Lab 12/03/15 1450 12/04/15 0737 12/05/15 0417 12/06/15 0918 12/07/15 0505  WBC 6.0 6.6 6.2 5.9 5.5  NEUTROABS 4.1  --   --   --   --   HGB 2.3* 9.3* 10.0* 9.7* 10.0*  HCT 7.7* 29.1* 32.0* 31.4* 32.7*  MCV 90.6 88.2 88.9 90.5 90.8  PLT 258 183 193 175 164    @IMGRELPRIORS @ Medications:    . antiseptic oral rinse  7 mL Mouth Rinse BID  . atorvastatin  40 mg Oral QHS  . calcium acetate  667 mg Oral TID WC  . darbepoetin (ARANESP) injection - DIALYSIS  150 mcg Intravenous Q Thu-HD  . doxercalciferol  7 mcg Intravenous Q T,Th,Sa-HD  . feeding supplement (PRO-STAT SUGAR FREE 64)  30 mL Oral BID  . hydrALAZINE  50 mg Oral BID  . labetalol  100 mg Oral TID  . levETIRAcetam  500 mg Oral BID  . losartan  100 mg Oral QPM  . multivitamin  1 tablet Oral QHS  . pantoprazole  40 mg Oral BID  . polyethylene glycol  17 g Oral  Daily  . saccharomyces boulardii  250 mg Oral Daily  . senna-docusate  1 tablet Oral BID  . sertraline  25 mg Oral Daily  . sodium chloride flush  3 mL Intravenous Q12H  . verapamil  120 mg Oral Q8H   Dialysis Orders:  [facility] TTS BFR 400, DFR Autoflow 1.5 EDW 62.5 (kg),  2 K, 2.25 Ca,  UFR Profile: Profile 2 LUA AVF NO Heparin Hectorol 7 mcg IV q treatment (last dose 12/02/15) Venofer 100 mg IV q week (last dose 12/02/15)  Mircera 150 mcg IV q 2 weeks (last dose 11/11/15)   Assessment/ Plan:   1. ABLA due to hematechezia.- for EGD and colonoscopy per GI.  [name] now stable for procedure as his mental status has improved.  2. ESRD cont with TTS schedule. 3. 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 Nutrition:clear liquid. Will need to be npo prior to egd and colonoscopy as well as bowel prep 6. Hypertension:stable 7. AMS- unclear etiology  but markedly improved.  Donetta Potts, MD [place] Pager [phone] 12/07/2015, 11:32 AM

## 2015-12-07 NOTE — Discharge Summary (Signed)
Physician Discharge Summary  [name] [id number] DOB: 05/23/1955 DOA: 12/03/2015  PCP: No primary care provider on file.  Admit date: 12/03/2015 Discharge date:12/08/2015  Admitted From: SNF Disposition:  SNF  Recommendations for Outpatient Follow-up:  1. Follow up with PCP in 1-2 weeks 2. Please obtain BMP/CBC in one week    Discharge Condition:stable CODE STATUS: FULL Diet recommendation: Renal with 1200 cc fluid restrict   Brief/Interim Summary: 60 y.o.malewith medical history of ESRD, hypertension, seizure disorder, diastolic CHF, polysubstance abuse including cocaine and tobacco presented with 3 episodes of hematochezia at his nursing facility on 12/03/2015. Notably, the patient was recently discharged from [facility] after a stay from 8/1-8/60for sepsis secondary to necrotic right foot. 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 During the hospitalization, the patient also developed respiratory failure secondary to opioid therapy and improved with naloxone. In addition, the patient has had several upper endoscopies, enteroscopy, and colonoscopy. Most recently, the patient had an EGD in 2012showing small hiatus hernia and colonoscopy in May 2012 with small tubular and normal. Enteroscopy in December 2012 was unremarkable. Capsule endoscopy in May 2015 was unremarkable.   Discharge Diagnoses:  Acute blood loss anemia -Secondary to likely lower GI bleed -transfused4 units PRBC-->Hgb remains stable -INR 1.23 -PTT 43 -protonix bid -advance diet-->tolerating dysphagia 3 -am CBC--Hgn has remained stable and he remains hemodynamically stable  Hematochezia -appreciate GI consult -Hgb has remained stable after transfusion -hold ASA -No plans forcolonoscopy and EGD at this point with stable Hgb and previous work up -continue to trend CBC -no further hematochezia since admission  Acute Encephalopathy -mulitfactorial including recent illicit  substance use, HTN encephalopathy, and profound anemia from ABLA with greatest contribution likely due to HTN encephalopathy -Episodes of worsening confusion seem to correlate when BP is at its highest -EEG--diffuse cerebral dysfunction -12/05/15--CT brain--no acute findings -8/18--ABG--7.38/49/122/29 on 2L -ammonia, B12--unremarkable -suspect he may have some baseline cognitive impairment as indicated by his advocate, Neoma Laming -pt's family not involved in his life -12/06/15--much more alert, near baseline -12/07/15--sleepy but awakens and answers appropriately -12/08/15--conversant, back to baseline  Fever -rectal temp on 99.9 in ED; EDP reports temp of 102 at SNF -CXR--no infiltrates -pt is anuric -Blood cultures x 2--neg to date -empiric vanc and cefepime-->d/c on 8/19--remained stable -R-stump does not look infected on exam  HTN urgency -Continue hydralazine, losartan, verapamil -increase hydralazine to 100 tid and continue labetalol -hydralazine prn BP > 180 -bradycardia has limited labetalol dose  Hypoglycemia -am cortisol--7.5 -in part due to poor po intake -improved with improved po intake  ESRD -Appreciate nephrology -HD on TTS  Polysubstance abuse -pt claims he smoked cocaine 2 days prior to admission -continues to smoke tobacco -defers nicotine patch  Seizure disorder -continue Keppra  Hyperlipidemia -continue statin    Discharge Instructions  Discharge Instructions    Diet - low sodium heart healthy    Complete by:  As directed   Increase activity slowly    Complete by:  As directed       Medication List    STOP taking these medications   levofloxacin 750 MG tablet Commonly known as:  LEVAQUIN   ondansetron 4 MG disintegrating tablet Commonly known as:  ZOFRAN-ODT   oxyCODONE-acetaminophen 5-325 MG tablet Commonly known as:  PERCOCET/ROXICET   sodium polystyrene 15 GM/60ML suspension Commonly known as:  KAYEXALATE     TAKE these  medications   amantadine 100 MG capsule Commonly known as:  SYMMETREL Take 100 mg by mouth 3 (three) times  daily.   aspirin 81 MG EC tablet Take 1 tablet (81 mg total) by mouth daily.   atorvastatin 40 MG tablet Commonly known as:  LIPITOR Take 1 tablet (40 mg total) by mouth at bedtime.   calcium acetate 667 MG capsule Commonly known as:  PHOSLO Take 667 mg by mouth 3 (three) times daily with meals.   gabapentin 100 MG capsule Commonly known as:  NEURONTIN Take 200 mg by mouth every 12 (twelve) hours.   hydrALAZINE 100 MG tablet Commonly known as:  APRESOLINE Take 1 tablet (100 mg total) by mouth every 8 (eight) hours. What changed:  medication strength  how much to take  when to take this  Another medication with the same name was removed. Continue taking this medication, and follow the directions you see here.   HYDROcodone-acetaminophen 5-325 MG tablet Commonly known as:  NORCO/VICODIN Take 1 tablet by mouth every 6 (six) hours as needed for moderate pain.   labetalol 100 MG tablet Commonly known as:  NORMODYNE Take 1 tablet (100 mg total) by mouth 3 (three) times daily.   levETIRAcetam 500 MG tablet Commonly known as:  KEPPRA Take 500 mg by mouth 2 (two) times daily.   losartan 100 MG tablet Commonly known as:  COZAAR Take 100 mg by mouth every evening.   Melatonin 3 MG Tabs Take 3 mg by mouth at bedtime.   ondansetron 4 MG tablet Commonly known as:  ZOFRAN Take 4 mg by mouth every 8 (eight) hours as needed for nausea or vomiting.   polyethylene glycol packet Commonly known as:  MIRALAX / GLYCOLAX Take 17 g by mouth daily.   RENA-VITE PO Take 1 tablet by mouth daily.   saccharomyces boulardii 250 MG capsule Commonly known as:  FLORASTOR Take 250 mg by mouth daily.   senna-docusate 8.6-50 MG tablet Commonly known as:  Senokot-S Take 1 tablet by mouth 2 (two) times daily. For constipation   sertraline 25 MG tablet Commonly known as:   ZOLOFT Take 25 mg by mouth daily.   verapamil 120 MG tablet Commonly known as:  CALAN Take 1 tablet (120 mg total) by mouth every 8 (eight) hours.       Allergies  Allergen Reactions  . Reglan [Metoclopramide] Other (See Comments)    Tardive dyskinesia in 11/2011 in [place]    Consultations:  Nephrology  [facility]   Procedures/Studies: Ct Head Wo Contrast  Result Date: 12/05/2015 CLINICAL DATA:  Altered mental status.  Chronic renal failure EXAM: CT HEAD WITHOUT CONTRAST TECHNIQUE: Contiguous axial images were obtained from the base of the skull through the vertex without intravenous contrast. COMPARISON:  February 01, 2014 FINDINGS: Brain: Ventricles and sulci are within normal limits for age. There is no intracranial mass, hemorrhage, extra-axial fluid collection, or midline shift. There is patchy small vessel disease in the centra semiovale bilaterally, most notably in the anterior centra semiovale bilaterally, stable. Small vessel disease is also noted in the mid pons in the basilar perforator distribution. Elsewhere gray-white compartments appear normal. No acute infarct evident. Vascular: There is no hyperdense vessel. There are foci of calcification in each carotid siphon and distal vertebral artery region. Skull: The bony calvarium appears intact. Sinuses/Orbits: Orbits appear symmetric bilaterally. Visualized paranasal sinuses are clear. Other: Mastoid air cells are clear. IMPRESSION: Stable supratentorial infratentorial small vessel disease. No acute infarct evident. No hemorrhage or mass effect. Foci of arterial vascular calcification noted. Electronically Signed   By: Lowella Grip III M.D.  On: 12/05/2015 09:06   Dg Chest Port 1 View  Result Date: 12/03/2015 CLINICAL DATA:  Fever and fatigue. EXAM: PORTABLE CHEST 1 VIEW COMPARISON:  08/16/2015 FINDINGS: 1514 hours. Lungs are hyperexpanded. The lungs are clear wiithout focal pneumonia, edema, pneumothorax or pleural  effusion. The cardio pericardial silhouette is enlarged. The visualized bony structures of the thorax are intact. Degenerative changes are seen in the glenohumeral joints bilaterally. IMPRESSION: No active disease. Electronically Signed   By: Misty Stanley M.D.   On: 12/03/2015 15:28        Discharge Exam: Vitals:   12/08/15 0411 12/08/15 0826  BP: (!) 195/62 (!) 153/55  Pulse: 74 64  Resp: 18 18  Temp: 98.4 F (36.9 C) 98 F (36.7 C)   Vitals:   12/07/15 1700 12/07/15 2127 12/08/15 0411 12/08/15 0826  BP: (!) 152/56 (!) 188/61 (!) 195/62 (!) 153/55  Pulse: 96 70 74 64  Resp: 18 18 18 18   Temp: 97.4 F (36.3 C) 98.1 F (36.7 C) 98.4 F (36.9 C) 98 F (36.7 C)  TempSrc: Oral Oral Oral Oral  SpO2: 93% 94% 95% 93%  Weight:  60.7 kg (133 lb 13.1 oz)    Height:        General: Pt is alert, awake, not in acute distress Cardiovascular: RRR, S1/S2 +, no rubs, no gallops Respiratory: CTA bilaterally, no wheezing, no rhonchi Abdominal: Soft, NT, ND, bowel sounds + Extremities: no edema, no cyanosis   The results of significant diagnostics from this hospitalization (including imaging, microbiology, ancillary and laboratory) are listed below for reference.    Significant Diagnostic Studies: Ct Head Wo Contrast  Result Date: 12/05/2015 CLINICAL DATA:  Altered mental status.  Chronic renal failure EXAM: CT HEAD WITHOUT CONTRAST TECHNIQUE: Contiguous axial images were obtained from the base of the skull through the vertex without intravenous contrast. COMPARISON:  February 01, 2014 FINDINGS: Brain: Ventricles and sulci are within normal limits for age. There is no intracranial mass, hemorrhage, extra-axial fluid collection, or midline shift. There is patchy small vessel disease in the centra semiovale bilaterally, most notably in the anterior centra semiovale bilaterally, stable. Small vessel disease is also noted in the mid pons in the basilar perforator distribution. Elsewhere  gray-white compartments appear normal. No acute infarct evident. Vascular: There is no hyperdense vessel. There are foci of calcification in each carotid siphon and distal vertebral artery region. Skull: The bony calvarium appears intact. Sinuses/Orbits: Orbits appear symmetric bilaterally. Visualized paranasal sinuses are clear. Other: Mastoid air cells are clear. IMPRESSION: Stable supratentorial infratentorial small vessel disease. No acute infarct evident. No hemorrhage or mass effect. Foci of arterial vascular calcification noted. Electronically Signed   By: Lowella Grip III M.D.   On: 12/05/2015 09:06   Dg Chest Port 1 View  Result Date: 12/03/2015 CLINICAL DATA:  Fever and fatigue. EXAM: PORTABLE CHEST 1 VIEW COMPARISON:  08/16/2015 FINDINGS: 1514 hours. Lungs are hyperexpanded. The lungs are clear wiithout focal pneumonia, edema, pneumothorax or pleural effusion. The cardio pericardial silhouette is enlarged. The visualized bony structures of the thorax are intact. Degenerative changes are seen in the glenohumeral joints bilaterally. IMPRESSION: No active disease. Electronically Signed   By: Misty Stanley M.D.   On: 12/03/2015 15:28     Microbiology: Recent Results (from the past 240 hour(s))  Blood Culture (routine x 2)     Status: None (Preliminary result)   Collection Time: 12/03/15  2:50 PM  Result Value Ref Range Status   Specimen Description BLOOD  BLOOD RIGHT FOREARM  Final   Special Requests BOTTLES DRAWN AEROBIC AND ANAEROBIC 5ML  Final   Culture NO GROWTH 4 DAYS  Final   Report Status PENDING  Incomplete  Blood Culture (routine x 2)     Status: None (Preliminary result)   Collection Time: 12/03/15  3:18 PM  Result Value Ref Range Status   Specimen Description BLOOD LEFT HAND  Final   Special Requests BOTTLES DRAWN AEROBIC AND ANAEROBIC 5CC  Final   Culture NO GROWTH 4 DAYS  Final   Report Status PENDING  Incomplete  MRSA PCR Screening     Status: None   Collection Time:  12/04/15 12:52 AM  Result Value Ref Range Status   MRSA by PCR NEGATIVE NEGATIVE Final    Comment:        The GeneXpert MRSA Assay (FDA approved for NASAL specimens only), is one component of a comprehensive MRSA colonization surveillance program. It is not intended to diagnose MRSA infection nor to guide or monitor treatment for MRSA infections.      Labs: Basic Metabolic Panel:  Recent Labs Lab 12/03/15 1450 12/04/15 0737 12/05/15 0948 12/06/15 0918 12/08/15 1104  NA 137 136 137 134* 133*  K 4.3 4.3 3.6 4.0 3.5  CL 99* 98* 101 99* 96*  CO2 31 28 28 28 29   GLUCOSE 59* 50* 77 98 118*  BUN 21* 24* 10 14 12   CREATININE 5.25* 5.94* 4.18* 5.51* 5.26*  CALCIUM 8.5* 8.6* 8.9 8.7* 9.9  PHOS  --   --   --  5.3* 4.4   Liver Function Tests:  Recent Labs Lab 12/03/15 1450 12/04/15 0737 12/05/15 0948 12/06/15 0918 12/08/15 1104  AST 18 19 17   --   --   ALT 10* 10* 11*  --   --   ALKPHOS 62 59 68  --   --   BILITOT 0.7 1.2 0.8  --   --   PROT 5.4* 5.6* 6.1*  --   --   ALBUMIN 1.9* 2.0* 2.1* 1.8* 1.9*   No results for input(s): LIPASE, AMYLASE in the last 168 hours.  Recent Labs Lab 12/05/15 0948  AMMONIA 30   CBC:  Recent Labs Lab 12/03/15 1450 12/04/15 0737 12/05/15 0417 12/06/15 0918 12/07/15 0505 12/08/15 1104  WBC 6.0 6.6 6.2 5.9 5.5 6.3  NEUTROABS 4.1  --   --   --   --   --   HGB 2.3* 9.3* 10.0* 9.7* 10.0* 10.0*  HCT 7.7* 29.1* 32.0* 31.4* 32.7* 32.7*  MCV 90.6 88.2 88.9 90.5 90.8 91.9  PLT 258 183 193 175 164 175   Cardiac Enzymes: No results for input(s): CKTOTAL, CKMB, CKMBINDEX, TROPONINI in the last 168 hours. BNP: Invalid input(s): POCBNP CBG:  Recent Labs Lab 12/07/15 1636 12/08/15 0002 12/08/15 0407 12/08/15 0747 12/08/15 1158  GLUCAP 106* 110* 102* 104* 116*    Time coordinating discharge:  Greater than 30 minutes  Signed:  Kincaid Tiger, DO [facility] Pager: [phone] 12/08/2015, 12:41 PM

## 2015-12-08 LAB — GLUCOSE, CAPILLARY
GLUCOSE-CAPILLARY: 107 mg/dL — AB (ref 65–99)
GLUCOSE-CAPILLARY: 102 mg/dL — AB (ref 65–99)
GLUCOSE-CAPILLARY: 116 mg/dL — AB (ref 65–99)
GLUCOSE-CAPILLARY: 122 mg/dL — AB (ref 65–99)
Glucose-Capillary: 104 mg/dL — ABNORMAL HIGH (ref 65–99)
Glucose-Capillary: 110 mg/dL — ABNORMAL HIGH (ref 65–99)

## 2015-12-08 LAB — CBC
HEMATOCRIT: 32.7 % — AB (ref 39.0–52.0)
HEMOGLOBIN: 10 g/dL — AB (ref 13.0–17.0)
MCH: 28.1 pg (ref 26.0–34.0)
MCHC: 30.6 g/dL (ref 30.0–36.0)
MCV: 91.9 fL (ref 78.0–100.0)
Platelets: 175 10*3/uL (ref 150–400)
RBC: 3.56 MIL/uL — ABNORMAL LOW (ref 4.22–5.81)
RDW: 16 % — ABNORMAL HIGH (ref 11.5–15.5)
WBC: 6.3 10*3/uL (ref 4.0–10.5)

## 2015-12-08 LAB — CULTURE, BLOOD (ROUTINE X 2)
Culture: NO GROWTH
Culture: NO GROWTH

## 2015-12-08 LAB — RENAL FUNCTION PANEL
ALBUMIN: 1.9 g/dL — AB (ref 3.5–5.0)
ANION GAP: 8 (ref 5–15)
BUN: 12 mg/dL (ref 6–20)
CHLORIDE: 96 mmol/L — AB (ref 101–111)
CO2: 29 mmol/L (ref 22–32)
Calcium: 9.9 mg/dL (ref 8.9–10.3)
Creatinine, Ser: 5.26 mg/dL — ABNORMAL HIGH (ref 0.61–1.24)
GFR calc Af Amer: 13 mL/min — ABNORMAL LOW (ref >60)
GFR calc non Af Amer: 11 mL/min — ABNORMAL LOW (ref >60)
GLUCOSE: 118 mg/dL — AB (ref 65–99)
PHOSPHORUS: 4.4 mg/dL (ref 2.5–4.6)
POTASSIUM: 3.5 mmol/L (ref 3.5–5.1)
Sodium: 133 mmol/L — ABNORMAL LOW (ref 135–145)

## 2015-12-08 MED ORDER — HYDRALAZINE HCL 50 MG PO TABS
100.0000 mg | ORAL_TABLET | Freq: Three times a day (TID) | ORAL | Status: DC
  Administered 2015-12-08: 100 mg via ORAL
  Filled 2015-12-08: qty 2

## 2015-12-08 MED ORDER — LABETALOL HCL 100 MG PO TABS: 100.0000 mg | ORAL_TABLET | Freq: Three times a day (TID) | ORAL | 0 refills | Status: AC

## 2015-12-08 MED ORDER — HYDROCODONE-ACETAMINOPHEN 5-325 MG PO TABS: 1.0000 | ORAL_TABLET | Freq: Four times a day (QID) | ORAL | 0 refills | Status: DC | PRN

## 2015-12-08 MED ORDER — HYDRALAZINE HCL 100 MG PO TABS: 100.0000 mg | ORAL_TABLET | Freq: Three times a day (TID) | ORAL | 0 refills | Status: DC

## 2015-12-08 NOTE — Progress Notes (Signed)
[facility] Progress Note  Assessment/Plan: 1. Acute blood loss anemia d/t hematochezia - hgb stable no further plans for EGD and colonoscopy 2. ESRD - TTS - HD on 8/19 on schedule  3. Anemia - Hgb 10.0 Last ESA - Aranesp 150 mcg on 8/17  4. Secondary hyperparathyroidism - cont VDRA, binders 5. HTN/volume -  HD on 9/19 net UF 1000 L post weight 59.7 kg BP stable cont meds  6. Nutrition - advance as tolerated, cont renal vitamins, protein supplement 7. AMS - greatly improved seems to be back to baseline 8. Fever - Remains afebrile W/u negative BC x2 neg to date   Lynnda Child PA-C [facility] 12/08/2015,11:47 AM  LOS: 5 days   Pt seen, examined and agree w A/P as above.  Kelly Splinter MD [facility] pager [phone]    cell [phone] 12/08/2015, 3:41 PM    Subjective:   Alert, sitting in chair talking. No c/os other than pain in R knee at incision site. Likely discharge today   Objective Vitals:   12/07/15 1700 12/07/15 2127 12/08/15 0411 12/08/15 0826  BP: (!) 152/56 (!) 188/61 (!) 195/62 (!) 153/55  Pulse: 96 70 74 64  Resp: 18 18 18 18   Temp: 97.4 F (36.3 C) 98.1 F (36.7 C) 98.4 F (36.9 C) 98 F (36.7 C)  TempSrc: Oral Oral Oral Oral  SpO2: 93% 94% 95% 93%  Weight:  60.7 kg (133 lb 13.1 oz)    Height:       Physical Exam General: Alert oriented x3 NAD Heart: RRR S1 S2 Lungs: Breathing unlabored. Lungs CTAB Abdomen:soft NT +BS Extremities: s/p R BKA staples in place, no drainage or erythema no L LE edema Dialysis Access: LAVF +thrill   [facility] BFR 400, DFR Autoflow 1.5 EDW 62.5(kg),  2K, 2.25 Ca,  UFR Profile: Profile 2 LUA AVF NO Heparin Hectorol 7 mcg IV q treatment (last dose 12/02/15) Venofer 100 mg IV q week (last dose 12/02/15)  Mircera 150 mcg IV q 2 weeks (last dose 11/11/15)   Additional Objective Labs: Basic Metabolic Panel:  Recent Labs Lab 12/04/15 0737  12/05/15 0948 12/06/15 0918  NA 136 137 134*  K 4.3 3.6 4.0  CL 98* 101 99*  CO2 28 28 28   GLUCOSE 50* 77 98  BUN 24* 10 14  CREATININE 5.94* 4.18* 5.51*  CALCIUM 8.6* 8.9 8.7*  PHOS  --   --  5.3*   Liver Function Tests:  Recent Labs Lab 12/03/15 1450 12/04/15 0737 12/05/15 0948 12/06/15 0918  AST 18 19 17   --   ALT 10* 10* 11*  --   ALKPHOS 62 59 68  --   BILITOT 0.7 1.2 0.8  --   PROT 5.4* 5.6* 6.1*  --   ALBUMIN 1.9* 2.0* 2.1* 1.8*   No results for input(s): LIPASE, AMYLASE in the last 168 hours. CBC:  Recent Labs Lab 12/03/15 1450 12/04/15 0737 12/05/15 0417 12/06/15 0918 12/07/15 0505  WBC 6.0 6.6 6.2 5.9 5.5  NEUTROABS 4.1  --   --   --   --   HGB 2.3* 9.3* 10.0* 9.7* 10.0*  HCT 7.7* 29.1* 32.0* 31.4* 32.7*  MCV 90.6 88.2 88.9 90.5 90.8  PLT 258 183 193 175 164   Blood Culture    Component Value Date/Time   SDES BLOOD LEFT HAND 12/03/2015 1518   SPECREQUEST BOTTLES DRAWN AEROBIC AND ANAEROBIC 5CC 12/03/2015 1518   CULT NO GROWTH 4  DAYS 12/03/2015 1518   REPTSTATUS PENDING 12/03/2015 1518    Cardiac Enzymes: No results for input(s): CKTOTAL, CKMB, CKMBINDEX, TROPONINI in the last 168 hours. CBG:  Recent Labs Lab 12/07/15 1154 12/07/15 1636 12/08/15 0002 12/08/15 0407 12/08/15 0747  GLUCAP 115* 106* 110* 102* 104*   Iron Studies: No results for input(s): IRON, TIBC, TRANSFERRIN, FERRITIN in the last 72 hours. Lab Results  Component Value Date   INR 1.23 12/03/2015   INR 0.96 08/16/2015   INR 1.26 04/12/2015   Studies/Results: No results found. Medications: . dextrose 25 mL/hr at 12/07/15 1051   . antiseptic oral rinse  7 mL Mouth Rinse BID  . atorvastatin  40 mg Oral QHS  . calcium acetate  667 mg Oral TID WC  . darbepoetin (ARANESP) injection - DIALYSIS  150 mcg Intravenous Q Thu-HD  . doxercalciferol  7 mcg Intravenous Q T,Th,Sa-HD  . feeding supplement (PRO-STAT SUGAR FREE 64)  30 mL Oral BID  . hydrALAZINE  100 mg Oral BID   . labetalol  100 mg Oral TID  . levETIRAcetam  500 mg Oral BID  . losartan  100 mg Oral QPM  . multivitamin  1 tablet Oral QHS  . pantoprazole  40 mg Oral BID  . polyethylene glycol  17 g Oral Daily  . saccharomyces boulardii  250 mg Oral Daily  . senna-docusate  1 tablet Oral BID  . sertraline  25 mg Oral Daily  . sodium chloride flush  3 mL Intravenous Q12H  . verapamil  120 mg Oral Q8H

## 2015-12-08 NOTE — Evaluation (Signed)
Physical Therapy Evaluation Patient Details Name: [name] MRN: [id number] DOB: 1955-10-31 Today's Date: 12/08/2015   History of Present Illness  60 y.o. male with medical history of ESRD, hypertension, seizure disorder, diastolic CHF, polysubstance abuse including cocaine and tobacco presented with 3 episodes of hematochezia at his nursing facility on 12/03/2015.  Notably, the patient was recently discharged from [facility] after a stay from 8/1-8/9 for sepsis secondary to necrotic right foot. 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  Clinical Impression  Pt admitted with above diagnosis. Pt currently with functional limitations due to the deficits listed below (see PT Problem List). Pt transferred bed to chair with max A, squat pivot transfer. Pt with generalized weakness and able to participate minimally with transfer.  Pt will benefit from skilled PT to increase their independence and safety with mobility to allow discharge to the venue listed below.       Follow Up Recommendations SNF;Supervision/Assistance - 24 hour    Equipment Recommendations  None recommended by PT    Recommendations for Other Services       Precautions / Restrictions Precautions Precautions: Fall Restrictions Weight Bearing Restrictions: No      Mobility  Bed Mobility Overal bed mobility: Needs Assistance Bed Mobility: Supine to Sit     Supine to sit: Min assist     General bed mobility comments: min A for LE's all the way off bed and elevation of trunk as well as scooting hips to EOB  Transfers Overall transfer level: Needs assistance Equipment used: None Transfers: Squat Pivot Transfers     Squat pivot transfers: Max assist     General transfer comment: max A given for squat pivot transfer and pt able to provide very little push with LLE. Was able to reach for arm of chair with vc's  Ambulation/Gait             General Gait Details: unable at this  point  Stairs            Wheelchair Mobility    Modified Rankin (Stroke Patients Only)       Balance Overall balance assessment: Needs assistance Sitting-balance support: Single extremity supported Sitting balance-Leahy Scale: Fair                                       Pertinent Vitals/Pain Pain Assessment: Faces Faces Pain Scale: Hurts even more Pain Location: RLE Pain Descriptors / Indicators: Burning Pain Intervention(s): Patient requesting pain meds-RN notified    Home Living Family/patient expects to be discharged to:: Skilled nursing facility                      Prior Function Level of Independence: Needs assistance   Gait / Transfers Assistance Needed: reports that staff has been helping him transfer to w/c  ADL's / Homemaking Assistance Needed: needs assistance        Hand Dominance   Dominant Hand: Right    Extremity/Trunk Assessment   Upper Extremity Assessment: Generalized weakness           Lower Extremity Assessment: Generalized weakness      Cervical / Trunk Assessment: Kyphotic  Communication   Communication: No difficulties  Cognition Arousal/Alertness: Awake/alert Behavior During Therapy: Flat affect Overall Cognitive Status: History of cognitive impairments - at baseline  General Comments General comments (skin integrity, edema, etc.): pt with RLE pain after transfer, RN notified    Exercises        Assessment/Plan    PT Assessment Patient needs continued PT services  PT Diagnosis Difficulty walking;Generalized weakness;Acute pain   PT Problem List Decreased strength;Decreased activity tolerance;Decreased balance;Decreased mobility;Decreased knowledge of use of DME;Decreased knowledge of precautions;Pain  PT Treatment Interventions DME instruction;Functional mobility training;Therapeutic activities;Therapeutic exercise;Balance training;Patient/family education   PT  Goals (Current goals can be found in the Care Plan section) Acute Rehab PT Goals Patient Stated Goal: get leg for RLE PT Goal Formulation: With patient Time For Goal Achievement: 12/22/15 Potential to Achieve Goals: Fair    Frequency Min 2X/week   Barriers to discharge        Co-evaluation               End of Session Equipment Utilized During Treatment: Gait belt Activity Tolerance: Patient tolerated treatment well Patient left: in chair;with call bell/phone within reach Nurse Communication: Mobility status;Patient requests pain meds         Time: 1052-1109 PT Time Calculation (min) (ACUTE ONLY): 17 min   Charges:   PT Evaluation $PT Eval Moderate Complexity: 1 Procedure     PT G Codes:       Leighton Roach, PT  Acute Rehab Services  [place], [place] 12/08/2015, 12:41 PM

## 2015-12-08 NOTE — Clinical Social Work Note (Signed)
Clinical Social Work Assessment  Patient Details  Name: [name] MRN: [id number] Date of Birth: 1955/07/23  Date of referral:  12/08/15               Reason for consult:  Facility Placement                Permission sought to share information with:  Chartered certified accountant granted to share information::  Yes, Verbal Permission Granted  Name::        Agency::  [place]  Relationship::     Contact Information:     Housing/Transportation Living arrangements for the past 2 months:  [place] of Information:  Patient, Other (Comment Required) (Chart) Patient Interpreter Needed:  None Criminal Activity/Legal Involvement Pertinent to Current Situation/Hospitalization:  No - Comment as needed Significant Relationships:  Friend Lives with:  Facility Resident ([place]) Do you feel safe going back to the place where you live?  Yes Need for family participation in patient care:  Yes (Comment)  Care giving concerns:  No concerns expressed by patient.   Social Worker assessment / plan:  CSW talked with patient and confirmed his decision to return to [facility]. Patient was asleep and did not want to talk with CSW, but did indicate that he was returning to facility.  Employment status:  Disabled (Comment on whether or not currently receiving Disability) Insurance information:  Medicare, Medicaid In [place] PT Recommendations:  [place] / Referral to community resources:  Other (Comment Required) (None needed or requested as patient from skilled facility)  Patient/Family's Response to care:  No concerns expressed by patient regarding care during hospitalization.  Patient/Family's Understanding of and Emotional Response to Diagnosis, Current Treatment, and Prognosis:  Not discussed.  Emotional Assessment Appearance:  Appears older than stated age Attitude/Demeanor/Rapport:  Other  (Patient was a bit agitated as he was asleep) Affect (typically observed):  Agitated Orientation:  Oriented to Self, Oriented to Place, Oriented to  Time, Oriented to Situation Alcohol / Substance use:  Tobacco Use, Alcohol Use, Illicit Drugs (Patient smokes, drink approx. 14.4 oz of alcohol per week and uses illicit drugs: crack, cocaine and marijuana) Psych involvement (Current and /or in the community):     Discharge Needs  Concerns to be addressed:  Discharge Planning Concerns Readmission within the last 30 days:  No Current discharge risk:  None Barriers to Discharge:  No Barriers Identified   Sable Feil, LCSW 12/08/2015, 5:40 PM

## 2015-12-08 NOTE — Clinical Social Work Note (Addendum)
Mr. Kassis is medically stable for discharge back to [facility] today. Discharge clinicals transmitted to facility and patient will be transported by ambulance. Contact made with friend Belinda Block and advised her of discharge back to SNF.    Sivan Quast [name], MSW, LCSW Licensed Clinical Social Worker [place] [phone]

## 2015-12-08 NOTE — NC FL2 (Signed)
[place] LEVEL OF CARE SCREENING TOOL     IDENTIFICATION  Patient Name: [name] Birthdate: 1955/05/11 Sex: male Admission Date (Current Location): 12/03/2015  [place] and [place] Number:  [place] [id number] N Facility and Address:  [facility] [place] [address], [place], [place] [address]      Provider Number: [id number]  Attending Physician Name and Address:  Orson Eva, MD  Relative Name and Phone Number:  Belinda Block - friend.  [phone]    Current Level of Care: Hospital Recommended Level of Care: [place] Prior Approval Number:    Date Approved/Denied:   PASRR Number: [id number] (11/24/15 - 01/23/16)  Discharge Plan: SNF    Current Diagnoses: Patient Active Problem List   Diagnosis Date Noted  . Mental status change   . GI bleed 12/03/2015  . Lower GI bleed 12/03/2015  . Anemia in chronic kidney disease 09/17/2015  . Polysubstance abuse 09/17/2015  . HLD (hyperlipidemia) 09/17/2015  . Acute hyperkalemia   . ESRD on dialysis ([place])   . Physical deconditioning   . Constipation   . EKG, abnormal   . Alcoholism /alcohol abuse ([place])   . NSTEMI (non-ST elevated myocardial infarction) ([place]) 08/16/2015  . Hypertensive emergency 08/16/2015  . Hepatitis C antibody test positive 06/23/2015  . History of hepatitis B virus infection 06/20/2015  . Acute encephalopathy 06/20/2015  . Acute uremia   . Hyperkalemia   . Alcohol withdrawal ([place]) 06/19/2015  . Acute GI bleeding 04/12/2015  . LVH (left ventricular hypertrophy) due to hypertensive disease 04/12/2015  . Prolonged Q-T interval on ECG 04/12/2015  . Bleeding gastrointestinal   . Anemia of chronic kidney failure 04/26/2014  . Hyperparathyroidism, secondary renal ([place]) 04/26/2014  . Hematochezia   . Protein-calorie malnutrition, severe ([place]) 01/25/2014  . Optic neuritis, left 01/19/2014  . Acute blood loss anemia 08/21/2013  . Cocaine abuse 03/02/2012  .  Tobacco abuse 03/02/2012  . Hypertension   . ESRD on hemodialysis ([place])   . Seizure disorder ([place])     Orientation RESPIRATION BLADDER Height & Weight     Self, Time, Situation, Place    Continent Weight: 133 lb 13.1 oz (60.7 kg) Height:  5\' 8"  (172.7 cm)  BEHAVIORAL SYMPTOMS/MOOD NEUROLOGICAL BOWEL NUTRITION STATUS      Continent Diet (Low sodium - Heart healthy)  AMBULATORY STATUS COMMUNICATION OF NEEDS Skin   Total Care (Patient was unable to ambulate with PT on 8/21 during evaluation) Verbally Skin abrasions (Left knee)                       Personal Care Assistance Level of Assistance  Bathing, Feeding, Dressing Bathing Assistance: Limited assistance Feeding assistance: Independent Dressing Assistance: Limited assistance     Functional Limitations Info  Sight, Hearing, Speech Sight Info: Adequate Hearing Info: Adequate Speech Info: Adequate    SPECIAL CARE FACTORS FREQUENCY  PT (By licensed PT)     PT Frequency: Evaluation 8/21 and a minimum of 2X per week therapy recommended              Contractures Contractures Info: Not present    Additional Factors Info  Code Status, Allergies Code Status Info: Full Allergies Info: Reglan           Current Medications (12/08/2015):  This is the current hospital active medication list Current Facility-Administered Medications  Medication Dose Route Frequency Provider Last Rate Last Dose  . acetaminophen (TYLENOL) tablet 650 mg  650  mg Oral Q6H PRN Orson Eva, MD   650 mg at 12/08/15 1245   Or  . acetaminophen (TYLENOL) suppository 650 mg  650 mg Rectal Q6H PRN Orson Eva, MD      . antiseptic oral rinse (CPC / CETYLPYRIDINIUM CHLORIDE 0.05%) solution 7 mL  7 mL Mouth Rinse BID Orson Eva, MD   7 mL at 12/08/15 1033  . atorvastatin (LIPITOR) tablet 40 mg  40 mg Oral QHS Orson Eva, MD   40 mg at 12/07/15 2200  . calcium acetate (PHOSLO) capsule 667 mg  667 mg Oral TID WC Orson Eva, MD   667 mg at 12/08/15 1032  .  Darbepoetin Alfa (ARANESP) injection 150 mcg  150 mcg Intravenous Q Thu-HD Lynnda Child, PA-C   150 mcg at 12/04/15 1722  . dextrose 10 % infusion   Intravenous Continuous Lynnda Child, PA-C 25 mL/hr at 12/07/15 1051    . doxercalciferol (HECTOROL) injection 7 mcg  7 mcg Intravenous Q T,Th,Sa-HD Lynnda Child, PA-C   7 mcg at 12/06/15 1052  . feeding supplement (PRO-STAT SUGAR FREE 64) liquid 30 mL  30 mL Oral BID Thomos Lemons Ejigiri, PA-C   30 mL at 12/08/15 1031  . hydrALAZINE (APRESOLINE) injection 10 mg  10 mg Intravenous Q6H PRN Orson Eva, MD   10 mg at 12/08/15 [id number]  . hydrALAZINE (APRESOLINE) tablet 100 mg  100 mg Oral Q8H David Tat, MD      . labetalol (NORMODYNE) tablet 100 mg  100 mg Oral TID Orson Eva, MD   100 mg at 12/08/15 1032  . levETIRAcetam (KEPPRA) tablet 500 mg  500 mg Oral BID Orson Eva, MD   500 mg at 12/08/15 1030  . losartan (COZAAR) tablet 100 mg  100 mg Oral QPM Orson Eva, MD   100 mg at 12/07/15 1802  . multivitamin (RENA-VIT) tablet 1 tablet  1 tablet Oral QHS Orson Eva, MD   1 tablet at 12/07/15 2200  . ondansetron (ZOFRAN) tablet 4 mg  4 mg Oral Q6H PRN Orson Eva, MD       Or  . ondansetron [facility]) injection 4 mg  4 mg Intravenous Q6H PRN Orson Eva, MD      . pantoprazole (PROTONIX) EC tablet 40 mg  40 mg Oral BID Orson Eva, MD   40 mg at 12/08/15 1030  . polyethylene glycol (MIRALAX / GLYCOLAX) packet 17 g  17 g Oral Daily Orson Eva, MD   17 g at 12/06/15 1452  . saccharomyces boulardii (FLORASTOR) capsule 250 mg  250 mg Oral Daily Orson Eva, MD   250 mg at 12/08/15 1030  . senna-docusate (Senokot-S) tablet 1 tablet  1 tablet Oral BID Orson Eva, MD   1 tablet at 12/08/15 1031  . sertraline (ZOLOFT) tablet 25 mg  25 mg Oral Daily Orson Eva, MD   25 mg at 12/08/15 1032  . sodium chloride flush (NS) 0.9 % injection 3 mL  3 mL Intravenous Q12H Orson Eva, MD   3 mL at 12/08/15 1033  . verapamil (CALAN) tablet 120 mg  120 mg Oral Q8H Orson Eva, MD    120 mg at 12/08/15 [id number]     Discharge Medications: Please see discharge summary for a list of discharge medications.  Relevant Imaging Results:  Relevant Lab Results:   Additional Information [id number].  Dialysis patient TTS at [facility], [place], [place]

## 2015-12-08 NOTE — Care Management Important Message (Signed)
Important Message  Patient Details  Name: [name] MRN: [id number] Date of Birth: 03/20/1956   Medicare Important Message Given:  Yes    Orbie Pyo 12/08/2015, 12:08 PM

## 2015-12-10 ENCOUNTER — Inpatient Hospital Stay (HOSPITAL_COMMUNITY)
Admission: EM | Admit: 2015-12-10 | Discharge: 2015-12-12 | DRG: 070 | Disposition: A | Discharge status: Skilled Nursing Facility | Payer: Medicare Other | Attending: Internal Medicine | Admitting: Internal Medicine

## 2015-12-10 ENCOUNTER — Emergency Department (HOSPITAL_COMMUNITY): Payer: Medicare Other

## 2015-12-10 ENCOUNTER — Inpatient Hospital Stay (HOSPITAL_COMMUNITY): Payer: Medicare Other

## 2015-12-10 ENCOUNTER — Encounter (HOSPITAL_COMMUNITY): Payer: Self-pay | Admitting: Emergency Medicine

## 2015-12-10 DIAGNOSIS — R4182 Altered mental status, unspecified: Secondary | ICD-10-CM | POA: Diagnosis present

## 2015-12-10 DIAGNOSIS — Z8249 Family history of ischemic heart disease and other diseases of the circulatory system: Secondary | ICD-10-CM | POA: Diagnosis not present

## 2015-12-10 DIAGNOSIS — R400 Somnolence: Secondary | ICD-10-CM

## 2015-12-10 DIAGNOSIS — G40909 Epilepsy, unspecified, not intractable, without status epilepticus: Secondary | ICD-10-CM | POA: Diagnosis present

## 2015-12-10 DIAGNOSIS — R609 Edema, unspecified: Secondary | ICD-10-CM

## 2015-12-10 DIAGNOSIS — E785 Hyperlipidemia, unspecified: Secondary | ICD-10-CM

## 2015-12-10 DIAGNOSIS — Z888 Allergy status to other drugs, medicaments and biological substances status: Secondary | ICD-10-CM | POA: Diagnosis not present

## 2015-12-10 DIAGNOSIS — I132 Hypertensive heart and chronic kidney disease with heart failure and with stage 5 chronic kidney disease, or end stage renal disease: Secondary | ICD-10-CM | POA: Diagnosis present

## 2015-12-10 DIAGNOSIS — I5033 Acute on chronic diastolic (congestive) heart failure: Secondary | ICD-10-CM | POA: Diagnosis present

## 2015-12-10 DIAGNOSIS — J811 Chronic pulmonary edema: Secondary | ICD-10-CM

## 2015-12-10 DIAGNOSIS — G9340 Encephalopathy, unspecified: Principal | ICD-10-CM | POA: Diagnosis present

## 2015-12-10 DIAGNOSIS — Z7982 Long term (current) use of aspirin: Secondary | ICD-10-CM | POA: Diagnosis not present

## 2015-12-10 DIAGNOSIS — I252 Old myocardial infarction: Secondary | ICD-10-CM

## 2015-12-10 DIAGNOSIS — R0989 Other specified symptoms and signs involving the circulatory and respiratory systems: Secondary | ICD-10-CM

## 2015-12-10 DIAGNOSIS — Z79899 Other long term (current) drug therapy: Secondary | ICD-10-CM | POA: Diagnosis not present

## 2015-12-10 DIAGNOSIS — Z992 Dependence on renal dialysis: Secondary | ICD-10-CM

## 2015-12-10 DIAGNOSIS — I10 Essential (primary) hypertension: Secondary | ICD-10-CM | POA: Diagnosis not present

## 2015-12-10 DIAGNOSIS — N186 End stage renal disease: Secondary | ICD-10-CM

## 2015-12-10 DIAGNOSIS — Z96652 Presence of left artificial knee joint: Secondary | ICD-10-CM | POA: Diagnosis present

## 2015-12-10 DIAGNOSIS — F1410 Cocaine abuse, uncomplicated: Secondary | ICD-10-CM | POA: Diagnosis present

## 2015-12-10 DIAGNOSIS — Z89511 Acquired absence of right leg below knee: Secondary | ICD-10-CM | POA: Diagnosis not present

## 2015-12-10 DIAGNOSIS — Z720 Tobacco use: Secondary | ICD-10-CM | POA: Diagnosis present

## 2015-12-10 DIAGNOSIS — D62 Acute posthemorrhagic anemia: Secondary | ICD-10-CM | POA: Diagnosis present

## 2015-12-10 DIAGNOSIS — R9431 Abnormal electrocardiogram [ECG] [EKG]: Secondary | ICD-10-CM | POA: Diagnosis present

## 2015-12-10 DIAGNOSIS — F17210 Nicotine dependence, cigarettes, uncomplicated: Secondary | ICD-10-CM | POA: Diagnosis present

## 2015-12-10 DIAGNOSIS — D638 Anemia in other chronic diseases classified elsewhere: Secondary | ICD-10-CM | POA: Diagnosis present

## 2015-12-10 DIAGNOSIS — R768 Other specified abnormal immunological findings in serum: Secondary | ICD-10-CM | POA: Diagnosis present

## 2015-12-10 DIAGNOSIS — Z8619 Personal history of other infectious and parasitic diseases: Secondary | ICD-10-CM | POA: Diagnosis present

## 2015-12-10 DIAGNOSIS — E162 Hypoglycemia, unspecified: Secondary | ICD-10-CM | POA: Diagnosis present

## 2015-12-10 LAB — I-STAT CHEM 8, ED
BUN: 26 mg/dL — ABNORMAL HIGH (ref 6–20)
CREATININE: 8.6 mg/dL — AB (ref 0.61–1.24)
Calcium, Ion: 1.26 mmol/L (ref 1.13–1.30)
Chloride: 92 mmol/L — ABNORMAL LOW (ref 101–111)
Glucose, Bld: 60 mg/dL — ABNORMAL LOW (ref 65–99)
HEMATOCRIT: 34 % — AB (ref 39.0–52.0)
HEMOGLOBIN: 11.6 g/dL — AB (ref 13.0–17.0)
Potassium: 4.2 mmol/L (ref 3.5–5.1)
Sodium: 134 mmol/L — ABNORMAL LOW (ref 135–145)
TCO2: 28 mmol/L (ref 0–100)

## 2015-12-10 LAB — COMPREHENSIVE METABOLIC PANEL
ALBUMIN: 2.2 g/dL — AB (ref 3.5–5.0)
ALK PHOS: 66 U/L (ref 38–126)
ALT: 7 U/L — AB (ref 17–63)
AST: 13 U/L — AB (ref 15–41)
Anion gap: 11 (ref 5–15)
BILIRUBIN TOTAL: 0.9 mg/dL (ref 0.3–1.2)
BUN: 25 mg/dL — AB (ref 6–20)
CO2: 25 mmol/L (ref 22–32)
CREATININE: 8.28 mg/dL — AB (ref 0.61–1.24)
Calcium: 10 mg/dL (ref 8.9–10.3)
Chloride: 95 mmol/L — ABNORMAL LOW (ref 101–111)
GFR calc Af Amer: 7 mL/min — ABNORMAL LOW (ref >60)
GFR calc non Af Amer: 6 mL/min — ABNORMAL LOW (ref >60)
GLUCOSE: 60 mg/dL — AB (ref 65–99)
POTASSIUM: 4.3 mmol/L (ref 3.5–5.1)
Sodium: 131 mmol/L — ABNORMAL LOW (ref 135–145)
TOTAL PROTEIN: 5.9 g/dL — AB (ref 6.5–8.1)

## 2015-12-10 LAB — CBC WITH DIFFERENTIAL/PLATELET
Basophils Absolute: 0 10*3/uL (ref 0.0–0.1)
Basophils Relative: 1 %
Eosinophils Absolute: 0.3 10*3/uL (ref 0.0–0.7)
Eosinophils Relative: 6 %
HEMATOCRIT: 32.7 % — AB (ref 39.0–52.0)
HEMOGLOBIN: 10.2 g/dL — AB (ref 13.0–17.0)
Lymphocytes Relative: 21 %
Lymphs Abs: 1.1 10*3/uL (ref 0.7–4.0)
MCH: 28.3 pg (ref 26.0–34.0)
MCHC: 31.2 g/dL (ref 30.0–36.0)
MCV: 90.6 fL (ref 78.0–100.0)
Monocytes Absolute: 0.7 10*3/uL (ref 0.1–1.0)
Monocytes Relative: 14 %
NEUTROS ABS: 3 10*3/uL (ref 1.7–7.7)
Neutrophils Relative %: 58 %
Platelets: 170 10*3/uL (ref 150–400)
RBC: 3.61 MIL/uL — AB (ref 4.22–5.81)
RDW: 16.1 % — ABNORMAL HIGH (ref 11.5–15.5)
WBC: 5.2 10*3/uL (ref 4.0–10.5)

## 2015-12-10 LAB — I-STAT TROPONIN, ED: Troponin i, poc: 0.06 ng/mL (ref 0.00–0.08)

## 2015-12-10 LAB — CBG MONITORING, ED
GLUCOSE-CAPILLARY: 75 mg/dL (ref 65–99)
Glucose-Capillary: 56 mg/dL — ABNORMAL LOW (ref 65–99)
Glucose-Capillary: 140 mg/dL — ABNORMAL HIGH (ref 65–99)

## 2015-12-10 LAB — I-STAT ARTERIAL BLOOD GAS, ED
Acid-Base Excess: 3 mmol/L — ABNORMAL HIGH (ref 0.0–2.0)
Bicarbonate: 29.3 mEq/L — ABNORMAL HIGH (ref 20.0–24.0)
O2 SAT: 94 %
PCO2 ART: 49.8 mmHg — AB (ref 35.0–45.0)
PH ART: 7.377 (ref 7.350–7.450)
PO2 ART: 74 mmHg — AB (ref 80.0–100.0)
Patient temperature: 98.6
TCO2: 31 mmol/L (ref 0–100)

## 2015-12-10 LAB — ACETAMINOPHEN LEVEL: Acetaminophen (Tylenol), Serum: <10 ug/mL — ABNORMAL LOW (ref 10–30)

## 2015-12-10 LAB — AMMONIA: Ammonia: 21 umol/L (ref 9–35)

## 2015-12-10 LAB — GLUCOSE, CAPILLARY: Glucose-Capillary: 57 mg/dL — ABNORMAL LOW (ref 65–99)

## 2015-12-10 LAB — I-STAT CG4 LACTIC ACID, ED: Lactic Acid, Venous: 0.46 mmol/L — ABNORMAL LOW (ref 0.5–1.9)

## 2015-12-10 MED ORDER — LOSARTAN POTASSIUM 50 MG PO TABS
100.0000 mg | ORAL_TABLET | Freq: Every evening | ORAL | Status: DC
  Administered 2015-12-11 – 2015-12-12 (×2): 100 mg via ORAL
  Filled 2015-12-10 (×3): qty 2

## 2015-12-10 MED ORDER — LEVETIRACETAM 500 MG PO TABS
500.0000 mg | ORAL_TABLET | Freq: Two times a day (BID) | ORAL | Status: DC
  Administered 2015-12-11 – 2015-12-12 (×5): 500 mg via ORAL
  Filled 2015-12-10 (×5): qty 1

## 2015-12-10 MED ORDER — BISACODYL 10 MG RE SUPP: 10.0000 mg | Freq: Every day | RECTAL | Status: DC | PRN

## 2015-12-10 MED ORDER — SODIUM CHLORIDE 0.9 % IV SOLN: 100.0000 mL | INTRAVENOUS | Status: DC | PRN

## 2015-12-10 MED ORDER — ATORVASTATIN CALCIUM 40 MG PO TABS
40.0000 mg | ORAL_TABLET | Freq: Every day | ORAL | Status: DC
  Administered 2015-12-11 – 2015-12-12 (×3): 40 mg via ORAL
  Filled 2015-12-10 (×3): qty 1

## 2015-12-10 MED ORDER — ACETAMINOPHEN 650 MG RE SUPP: 650.0000 mg | Freq: Four times a day (QID) | RECTAL | Status: DC | PRN

## 2015-12-10 MED ORDER — MAGNESIUM CITRATE PO SOLN: 1.0000 | Freq: Once | ORAL | Status: DC | PRN

## 2015-12-10 MED ORDER — NALOXONE HCL 0.4 MG/ML IJ SOLN
0.4000 mg | Freq: Once | INTRAMUSCULAR | Status: AC
  Administered 2015-12-10: 0.4 mg via INTRAVENOUS
  Filled 2015-12-10: qty 1

## 2015-12-10 MED ORDER — VERAPAMIL HCL 120 MG PO TABS
120.0000 mg | ORAL_TABLET | Freq: Three times a day (TID) | ORAL | Status: DC
  Administered 2015-12-11 – 2015-12-12 (×5): 120 mg via ORAL
  Filled 2015-12-10 (×8): qty 1

## 2015-12-10 MED ORDER — CALCIUM ACETATE (PHOS BINDER) 667 MG PO CAPS
667.0000 mg | ORAL_CAPSULE | Freq: Three times a day (TID) | ORAL | Status: DC
  Administered 2015-12-11 – 2015-12-12 (×4): 667 mg via ORAL
  Filled 2015-12-10 (×9): qty 1

## 2015-12-10 MED ORDER — ACETAMINOPHEN 325 MG PO TABS
650.0000 mg | ORAL_TABLET | Freq: Four times a day (QID) | ORAL | Status: DC | PRN
  Administered 2015-12-10: 650 mg via ORAL

## 2015-12-10 MED ORDER — HEPARIN SODIUM (PORCINE) 1000 UNIT/ML DIALYSIS: 1000.0000 [IU] | INTRAMUSCULAR | Status: DC | PRN

## 2015-12-10 MED ORDER — SACCHAROMYCES BOULARDII 250 MG PO CAPS
250.0000 mg | ORAL_CAPSULE | Freq: Every day | ORAL | Status: DC
  Administered 2015-12-11 – 2015-12-12 (×2): 250 mg via ORAL
  Filled 2015-12-10 (×2): qty 1

## 2015-12-10 MED ORDER — ASPIRIN EC 81 MG PO TBEC
81.0000 mg | DELAYED_RELEASE_TABLET | Freq: Every day | ORAL | Status: DC
  Administered 2015-12-11 – 2015-12-12 (×2): 81 mg via ORAL
  Filled 2015-12-10 (×2): qty 1

## 2015-12-10 MED ORDER — GABAPENTIN 100 MG PO CAPS
200.0000 mg | ORAL_CAPSULE | Freq: Two times a day (BID) | ORAL | Status: DC
  Administered 2015-12-11 – 2015-12-12 (×5): 200 mg via ORAL
  Filled 2015-12-10 (×5): qty 2

## 2015-12-10 MED ORDER — PENTAFLUOROPROP-TETRAFLUOROETH EX AERO: 1.0000 "application " | INHALATION_SPRAY | CUTANEOUS | Status: DC | PRN

## 2015-12-10 MED ORDER — LABETALOL HCL 100 MG PO TABS
100.0000 mg | ORAL_TABLET | Freq: Three times a day (TID) | ORAL | Status: DC
  Administered 2015-12-11 – 2015-12-12 (×4): 100 mg via ORAL
  Filled 2015-12-10 (×4): qty 1

## 2015-12-10 MED ORDER — ACETAMINOPHEN 325 MG PO TABS
ORAL_TABLET | ORAL | Status: AC
  Administered 2015-12-10: 650 mg via ORAL
  Filled 2015-12-10: qty 2

## 2015-12-10 MED ORDER — DEXTROSE 50 % IV SOLN
1.0000 | Freq: Once | INTRAVENOUS | Status: AC
  Administered 2015-12-10: 50 mL via INTRAVENOUS
  Filled 2015-12-10: qty 50

## 2015-12-10 MED ORDER — LIDOCAINE-PRILOCAINE 2.5-2.5 % EX CREA: 1.0000 "application " | TOPICAL_CREAM | CUTANEOUS | Status: DC | PRN

## 2015-12-10 MED ORDER — LIDOCAINE HCL (PF) 1 % IJ SOLN: 5.0000 mL | INTRAMUSCULAR | Status: DC | PRN

## 2015-12-10 MED ORDER — SENNOSIDES-DOCUSATE SODIUM 8.6-50 MG PO TABS
1.0000 | ORAL_TABLET | Freq: Every evening | ORAL | Status: DC | PRN
  Filled 2015-12-10 (×2): qty 1

## 2015-12-10 MED ORDER — ALTEPLASE 2 MG IJ SOLR: 2.0000 mg | Freq: Once | INTRAMUSCULAR | Status: DC | PRN

## 2015-12-10 MED ORDER — HEPARIN SODIUM (PORCINE) 5000 UNIT/ML IJ SOLN
5000.0000 [IU] | Freq: Three times a day (TID) | INTRAMUSCULAR | Status: DC
  Administered 2015-12-11 – 2015-12-12 (×3): 5000 [IU] via SUBCUTANEOUS
  Filled 2015-12-10 (×2): qty 1

## 2015-12-10 NOTE — ED Notes (Signed)
Attempted report 

## 2015-12-10 NOTE — ED Notes (Signed)
[name] (friend) [phone]

## 2015-12-10 NOTE — H&P (Signed)
History and Physical    [name] [id number] DOB: 24-Jun-1955 DOA: 12/10/2015   PCP: No primary care provider on file.   Patient coming from:   SNF   Chief Complaint: Acute Encephalopathy  HPI: [name] is a 60 y.o. male with medical history significant for ESRD on hemodialysis Tuesday Thursday Saturday, last on 8/21, hypertension, seizure disorder, diastolic heart failure, chronic anemia on Aranesp Q Thursday  with HD polysubstance abuse including cocaine and tobacco,recently history of sepsis secondary to necrotic right foot undergoing right BKA, discharged on 11/26/2015, recently hospitalized from 37 through 12/07/2015 due to GI bleed requiring transfusion of 4 units of blood, brought from his skilled nursing facility due to acute mental status changes. According to the nurse at the facility, the patient was in his usual state of health until this morning. He was apparently at his baseline behavioral health status. Of note, during prior to admission, the patient deet have an episode of acute encephalopathy, which was felt to be multifactorial, including recent illicit substance abuse, elevated blood pressure, and profound anemia from ABLA. Studies at the time included EEG, showing diffuse cerebral dysfunction, negative CT, unremarkable ammonia B-12. At the time of discharge, the patient was baseline alert and conversant. Today, he appears to have similar symptoms .Patient was unresponsive at the time of admission, and continues to be unable to engage in conversation. He does respond to verbal and tactile stimulation. Other ROS cannot be obtained at this time   ED Course:  BP 180/69   Pulse 65   Temp 97.4 F (36.3 C) (Oral)   Resp 12   SpO2 100%      Review of Systems: As per HPI otherwise other ROS unable to be obtained   Past Medical History:  Diagnosis Date  . Anemia, chronic disease    a. Capsule endoscopy reportedly negative 08/2013. b. seen by heme with concern for  minor B cell population on BMB, being observed.  . Arthritis    "right shoulder" (11/09/2012)  . Chronic diastolic CHF (congestive heart failure) ([place])    a. EF 60 - 65% per [place] echo 11/2011. b. Echo 02/2012: severe LVH, focal basal hypertrophy, EF 60-65%, mild Mr.  . Cocaine abuse    mentioned in notes from [place]  . ESRD (end stage renal disease) on dialysis [facility])    "TTS; [address]" (06/19/2015)  . ESRD on hemodialysis ([place]) since 2012   a. Since 2012. ESRD was due to "drugs", primarily used cocaine.  Has 3-5 year hx of HTN, no DM.  Gets HD on TTS schedule at [facility]. Originally from [place].   . GI bleed 05/04/2012  . Head injury, closed, with concussion ([place])   . Headache(784.0)   . Helicobacter pylori gastritis    not defined if this was treated  . Hematochezia   . Hepatitis B core antibody positive    03/01/10  . Hepatitis C antibody test positive    was HIV negative, 02/28/12  . History of blood transfusion   . Hypercalcemia   . Hyperpotassemia   . Hypertension   . Hypertensive heart disease   . Optic neuritis, left   . Polyp of colon, adenomatous    May 2012.  Dr Trenton Founds in [place]  . Positive QuantiFERON-TB Gold test    11/2011  . Protein-calorie malnutrition, severe ([place])   . Seizure disorder [facility])    questionable history of - will need to clarify with PCP  . Tobacco abuse  Past Surgical History:  Procedure Laterality Date  . [place] TRANSPOSITION  03/07/2012   Procedure: BASCILIC VEIN TRANSPOSITION;  Surgeon: Conrad Bleckley, MD;  Location: [place];  Service: Vascular;  Laterality: Left;  First Stage  . BASCILIC VEIN TRANSPOSITION Left 05/31/2012   Procedure: BASCILIC VEIN TRANSPOSITION;  Surgeon: Conrad Hinsdale, MD;  Location: [place];  Service: Vascular;  Laterality: Left;  Left 2nd Stage Basilic Vein Transposition with gortex graft revision using 29mmx10cm graft  . [name] CAPSULE STUDY N/A 08/27/2013   Procedure: [name] CAPSULE STUDY;   Surgeon: Milus Banister, MD;  Location: [place];  Service: Endoscopy;  Laterality: N/A;  . INSERTION OF DIALYSIS CATHETER     right chest  . JOINT REPLACEMENT    . SHOULDER OPEN ROTATOR CUFF REPAIR Right   . TOTAL KNEE ARTHROPLASTY Left     Social History Social History   Social History  . Marital status: Single    Spouse name: N/A  . Number of children: 1  . Years of education: N/A   Occupational History  . Unemployed    Social History Main Topics  . Smoking status: Current Every Day Smoker    Packs/day: 1.50    Years: 45.00    Types: Cigarettes  . Smokeless tobacco: Never Used  . Alcohol use 14.4 oz/week    24 Cans of beer per week     Comment: 1 40's per day  . Drug use:     Types: "Crack" cocaine, Cocaine, Marijuana     Comment: 06/19/2015 "still use but don't use drugs very much"  . Sexual activity: Yes    Birth control/ protection: None   Other Topics Concern  . Not on file   Social History Narrative  . No narrative on file     Allergies  Allergen Reactions  . Reglan [Metoclopramide] Other (See Comments)    Tardive dyskinesia in 11/2011 in [place]    Family History  Problem Relation Age of Onset  . Diabetes Mother   . Hypertension Mother   . Stroke Mother   . Kidney failure Mother   . Cancer Father       Prior to Admission medications   Medication Sig Start Date End Date Taking? Authorizing Provider  amantadine (SYMMETREL) 100 MG capsule Take 100 mg by mouth 3 (three) times daily.    Historical Provider, MD  aspirin EC 81 MG EC tablet Take 1 tablet (81 mg total) by mouth daily. 08/20/15   Ripudeep Krystal Eaton, MD  atorvastatin (LIPITOR) 40 MG tablet Take 1 tablet (40 mg total) by mouth at bedtime. 08/20/15   Ripudeep Krystal Eaton, MD  B Complex-C-Folic Acid (RENA-VITE PO) Take 1 tablet by mouth daily.    Historical Provider, MD  calcium acetate (PHOSLO) 667 MG capsule Take 667 mg by mouth 3 (three) times daily with meals.     Historical Provider, MD    gabapentin (NEURONTIN) 100 MG capsule Take 200 mg by mouth every 12 (twelve) hours.     Historical Provider, MD  hydrALAZINE (APRESOLINE) 100 MG tablet Take 1 tablet (100 mg total) by mouth every 8 (eight) hours. 12/08/15   Orson Eva, MD  HYDROcodone-acetaminophen (NORCO/VICODIN) 5-325 MG tablet Take 1 tablet by mouth every 6 (six) hours as needed for moderate pain. 12/08/15   Orson Eva, MD  labetalol (NORMODYNE) 100 MG tablet Take 1 tablet (100 mg total) by mouth 3 (three) times daily. 12/08/15   Orson Eva, MD  levETIRAcetam (KEPPRA) 500  MG tablet Take 500 mg by mouth 2 (two) times daily.    Historical Provider, MD  losartan (COZAAR) 100 MG tablet Take 100 mg by mouth every evening. 10/02/14   Historical Provider, MD  Melatonin 3 MG TABS Take 3 mg by mouth at bedtime.    Historical Provider, MD  ondansetron (ZOFRAN) 4 MG tablet Take 4 mg by mouth every 8 (eight) hours as needed for nausea or vomiting.    Historical Provider, MD  polyethylene glycol (MIRALAX / GLYCOLAX) packet Take 17 g by mouth daily. 08/26/15   Bonnell Public, MD  saccharomyces boulardii (FLORASTOR) 250 MG capsule Take 250 mg by mouth daily.    Historical Provider, MD  senna-docusate (SENOKOT-S) 8.6-50 MG tablet Take 1 tablet by mouth 2 (two) times daily. For constipation 08/21/15   Ripudeep Krystal Eaton, MD  sertraline (ZOLOFT) 25 MG tablet Take 25 mg by mouth daily.    Historical Provider, MD  verapamil (CALAN) 120 MG tablet Take 1 tablet (120 mg total) by mouth every 8 (eight) hours. 08/20/15   Ripudeep Krystal Eaton, MD    Physical Exam:    Vitals:   12/10/15 1530 12/10/15 1545 12/10/15 1600 12/10/15 1615  BP: 182/70 183/73 188/73 180/69  Pulse: 65 66 66 65  Resp: 11 (!) 0 11 12  Temp:      TempSrc:      SpO2: 100% 91% 100% 100%       Constitutional: NAD,lethargc, responds to touch and voice  Vitals:   12/10/15 1530 12/10/15 1545 12/10/15 1600 12/10/15 1615  BP: 182/70 183/73 188/73 180/69  Pulse: 65 66 66 65  Resp: 11 (!) 0  11 12  Temp:      TempSrc:      SpO2: 100% 91% 100% 100%   Eyes: PERRL, lids and conjunctivae normal ENMT: Mucous membranes are moist. Posterior pharynx clear of any exudate or lesions.Normal dentition.  Neck: normal, supple, no masses, no thyromegaly Respiratory: no wheezing,bilateral crackles. Normal respiratory effort. No accessory muscle use.  Cardiovascular: Regular rate and rhythm, no murmurs / rubs / gallops. No extremity edema on left, R BKA . 2+ pedal pulses. No carotid bruits.  Abdomen: no tenderness, no masses palpated. No hepatosplenomegaly. Bowel sounds positive.  Musculoskeletal: no clubbing / cyanosis. No joint deformity upper and lower extremities. Good ROM, no contractures. Normal muscle tone.  Skin: no rashes, lesions, ulcers. Right stump healing well, staples present  Neurologic: CN 2-12 grossly intact. Sensation intact, DTR normal. Strength 5/5 in all 4.  Psychiatric: Normal judgment and insight. Alert and oriented x 3. Normal mood.     Labs on Admission: I have personally reviewed following labs and imaging studies  CBC:  Recent Labs Lab 12/05/15 0417 12/06/15 0918 12/07/15 0505 12/08/15 1104 12/10/15 1203 12/10/15 1223  WBC 6.2 5.9 5.5 6.3 5.2  --   NEUTROABS  --   --   --   --  3.0  --   HGB 10.0* 9.7* 10.0* 10.0* 10.2* 11.6*  HCT 32.0* 31.4* 32.7* 32.7* 32.7* 34.0*  MCV 88.9 90.5 90.8 91.9 90.6  --   PLT 193 175 164 175 170  --     Basic Metabolic Panel:  Recent Labs Lab 12/04/15 0737 12/05/15 0948 12/06/15 0918 12/08/15 1104 12/10/15 1203 12/10/15 1223  NA 136 137 134* 133* 131* 134*  K 4.3 3.6 4.0 3.5 4.3 4.2  CL 98* 101 99* 96* 95* 92*  CO2 28 28 28 29 25   --  GLUCOSE 50* 77 98 118* 60* 60*  BUN 24* 10 14 12  25* 26*  CREATININE 5.94* 4.18* 5.51* 5.26* 8.28* 8.60*  CALCIUM 8.6* 8.9 8.7* 9.9 10.0  --   PHOS  --   --  5.3* 4.4  --   --     GFR: Estimated Creatinine Clearance: 8 mL/min (by C-G formula based on SCr of 8.6  mg/dL).  Liver Function Tests:  Recent Labs Lab 12/04/15 0737 12/05/15 0948 12/06/15 0918 12/08/15 1104 12/10/15 1203  AST 19 17  --   --  13*  ALT 10* 11*  --   --  7*  ALKPHOS 59 68  --   --  66  BILITOT 1.2 0.8  --   --  0.9  PROT 5.6* 6.1*  --   --  5.9*  ALBUMIN 2.0* 2.1* 1.8* 1.9* 2.2*   No results for input(s): LIPASE, AMYLASE in the last 168 hours.  Recent Labs Lab 12/05/15 0948 12/10/15 1256  AMMONIA 30 21    Coagulation Profile: No results for input(s): INR, PROTIME in the last 168 hours.  Cardiac Enzymes: No results for input(s): CKTOTAL, CKMB, CKMBINDEX, TROPONINI in the last 168 hours.  BNP (last 3 results) No results for input(s): PROBNP in the last 8760 hours.  HbA1C: No results for input(s): HGBA1C in the last 72 hours.  CBG:  Recent Labs Lab 12/08/15 0407 12/08/15 0747 12/08/15 1158 12/08/15 1654 12/10/15 1454  GLUCAP 102* 104* 116* 122* 75    Lipid Profile: No results for input(s): CHOL, HDL, LDLCALC, TRIG, CHOLHDL, LDLDIRECT in the last 72 hours.  Thyroid Function Tests: No results for input(s): TSH, T4TOTAL, FREET4, T3FREE, THYROIDAB in the last 72 hours.  Anemia Panel: No results for input(s): VITAMINB12, FOLATE, FERRITIN, TIBC, IRON, RETICCTPCT in the last 72 hours.  Urine analysis:    Component Value Date/Time   COLORURINE YELLOW 11/07/2012 1903   APPEARANCEUR CLEAR 11/07/2012 1903   LABSPEC 1.012 11/07/2012 1903   PHURINE 8.5 (H) 11/07/2012 1903   GLUCOSEU 250 (A) 11/07/2012 1903   HGBUR NEGATIVE 11/07/2012 1903   BILIRUBINUR NEGATIVE 11/07/2012 1903   KETONESUR NEGATIVE 11/07/2012 1903   PROTEINUR >300 (A) 11/07/2012 1903   UROBILINOGEN 0.2 11/07/2012 1903   NITRITE NEGATIVE 11/07/2012 1903   LEUKOCYTESUR NEGATIVE 11/07/2012 1903    Sepsis Labs: @LABRCNTIP (procalcitonin:4,lacticidven:4) ) Recent Results (from the past 240 hour(s))  Blood Culture (routine x 2)     Status: None   Collection Time: 12/03/15  2:50  PM  Result Value Ref Range Status   Specimen Description BLOOD BLOOD RIGHT FOREARM  Final   Special Requests BOTTLES DRAWN AEROBIC AND ANAEROBIC 5ML  Final   Culture NO GROWTH 5 DAYS  Final   Report Status 12/08/2015 FINAL  Final  Blood Culture (routine x 2)     Status: None   Collection Time: 12/03/15  3:18 PM  Result Value Ref Range Status   Specimen Description BLOOD LEFT HAND  Final   Special Requests BOTTLES DRAWN AEROBIC AND ANAEROBIC 5CC  Final   Culture NO GROWTH 5 DAYS  Final   Report Status 12/08/2015 FINAL  Final  MRSA PCR Screening     Status: None   Collection Time: 12/04/15 12:52 AM  Result Value Ref Range Status   MRSA by PCR NEGATIVE NEGATIVE Final    Comment:        The GeneXpert MRSA Assay (FDA approved for NASAL specimens only), is one component of a comprehensive MRSA colonization surveillance  program. It is not intended to diagnose MRSA infection nor to guide or monitor treatment for MRSA infections.      Radiological Exams on Admission: Ct Head Wo Contrast  Result Date: 12/10/2015 CLINICAL DATA:  Altered mental status.  Chronic renal failure EXAM: CT HEAD WITHOUT CONTRAST TECHNIQUE: Contiguous axial images were obtained from the base of the skull through the vertex without intravenous contrast. COMPARISON:  December 05, 2015 FINDINGS: Brain: The ventricles and sulci remain within normal limits for age. There is no intracranial mass, hemorrhage, extra-axial fluid collection, or midline shift. There is stable small vessel disease in the centra semiovale bilaterally, most notably anteriorly. Small vessel disease in the pons is also present and unchanged. There is no new gray-white compartment lesion evident. No acute infarct evident. Vascular: No hyperdense vessels are evident. Calcification in each carotid siphon and distal vertebral artery regions remains. Skull: Bony calvarium appears intact. Sinuses/Orbits: Visualized paranasal sinuses are clear. Orbits appear  symmetric bilaterally. Orbits Other: Mastoid air cells are clear. IMPRESSION: Stable small vessel disease in supratentorial and infratentorial regions. No acute infarct evident. No mass, hemorrhage, or extra-axial fluid collection. There are multiple foci of arterial vascular calcification. Electronically Signed   By: Lowella Grip III M.D.   On: 12/10/2015 14:03   Dg Chest Port 1 View  Result Date: 12/10/2015 CLINICAL DATA:  Weakness and lethargy.  Mostly unresponsive. EXAM: PORTABLE CHEST 1 VIEW COMPARISON:  12/03/2015 FINDINGS: The cardio pericardial silhouette is enlarged. There is pulmonary vascular congestion without overt pulmonary edema. Superimposed interstitial pulmonary edema likely. The visualized bony structures of the thorax are intact. Telemetry leads overlie the chest. IMPRESSION: Cardiomegaly with interstitial pulmonary edema. Electronically Signed   By: Misty Stanley M.D.   On: 12/10/2015 12:48   Dg Knee Complete 4 Views Right  Result Date: 12/10/2015 CLINICAL DATA:  History of right below-the-knee amputation. Recent fevers. Question osteomyelitis. EXAM: RIGHT KNEE - COMPLETE 4+ VIEW COMPARISON:  None. FINDINGS: Surgical staples are in place at the patient's below-the-knee amputation. No soft tissue gas collection is identified. No bony destructive change is seen. There is no fracture. Atherosclerosis is noted. IMPRESSION: Status post right BKA.  No acute abnormality. Electronically Signed   By: Inge Rise M.D.   On: 12/10/2015 16:44    EKG: Independently reviewed.  Assessment/Plan Active Problems:   Hypertension   ESRD on hemodialysis (HCC)   Seizure disorder (HCC)   Cocaine abuse   Tobacco abuse   Prolonged Q-T interval on ECG   History of hepatitis B virus infection   Acute encephalopathy   Hepatitis C antibody test positive   HLD (hyperlipidemia)   ESRD on dialysis [facility])   Mental status change   Encephalopathy     Acute Confusional State , unclear  etiology at this time. He was recently discharged from hospital on 8/21. His confusional state may be similar in presentation to the one from prior admission. HTN may need to be taken into consideration, versus unwitnessed seizure, polypharmacy. Doubt infection  Doubt hypoglycemia as when corrected, he continued to experience acute encephalopathy.  Glucose was low on admission,   one partly due to poor oral intake. WBC normal. Afebrile. Bili 0.9, transaminases normal Lactic acid normal . CT head is negative for acute intracranial abnormalities. No seizures noted here. Possible baseline dementia. History of Hep B and C per chart  Admit to med-surg  Seizure precautions Frequent neuro check   Urine Drug Screen CHeck ETOH levels  BCx , urine cultures pending  BMET and CBG monitoring CBC in am  CXR in am   Hold home sedative medications Continue Keppra  Monitor CBG    Hypertension BP 183/77 . Monitor carefully for PRES Posterior Reverse Encephalopathy Syndrome  Controlled Continue home anti-hypertensive medications  Add Hydralazine Q6 hours as needed for BP 210/110    Anemia of chronic disease  Hemoglobin 10.2, at baseline.  No bleding issues are present at this time  -Transfuse 1 unit packed red blood cells if Hb less than 8 or acutely bleeding   ESRD on HDD on hemodialysis Tuesday Thursday Saturday, last on 8/21. Creatinine 8.28, baseline around 5.5  Nephrology involved, notified as he will need dialysis for fluid overload as evidenced in CXR  Renal Diet. Other plans as per Nephrology Check CMET in am    DVT prophylaxis:   Heparin   Code Status:   Full   Family Communication:  None Disposition Plan: Expect patient to be discharged to SNF after condition improves Consults called Renal informed of patient admission for future dialysis Admission status:  Inpatient  Medsurg     [facility] E, PA-C [facility]   If 7PM-7AM, please contact  night-coverage [email or web address] Password [facility]  12/10/2015, 5:05 PM

## 2015-12-10 NOTE — ED Notes (Signed)
Nursing home contacted in regards to last dialysis-- nurse states pt was discharged from hospital on Monday. Also states that only visitor should be Belinda Block-- [phone]. This nurse called this number and left a message.

## 2015-12-10 NOTE — ED Notes (Signed)
Pt returned from xray

## 2015-12-10 NOTE — ED Provider Notes (Addendum)
[facility] DEPT Provider Note   CSN: [id number] Arrival date & time: 12/10/15  1148     History   Chief Complaint Chief Complaint  Patient presents with  . Weakness  . Altered Mental Status    HPI [name] is a 60 y.o. male.  He presents due to decreased mental status. Complex history including recent BKA of nonhealing right foot ulcer. Recent GI bleed down to a hemoglobin of 2.8. Discharged 3 days ago. Dialyzed yesterday. Is been speaking last since "sorting" at his facility. Unable offer complaints. No noted fever. No additional GI bleeding. No complaints of pain.  HPI  Past Medical History:  Diagnosis Date  . Anemia, chronic disease    a. Capsule endoscopy reportedly negative 08/2013. b. seen by heme with concern for minor B cell population on BMB, being observed.  . Arthritis    "right shoulder" (11/09/2012)  . Chronic diastolic CHF (congestive heart failure) ([place])    a. EF 60 - 65% per [place] echo 11/2011. b. Echo 02/2012: severe LVH, focal basal hypertrophy, EF 60-65%, mild Mr.  . Cocaine abuse    mentioned in notes from [place]  . ESRD (end stage renal disease) on dialysis [facility])    "TTS; [address]" (06/19/2015)  . ESRD on hemodialysis ([place]) since 2012   a. Since 2012. ESRD was due to "drugs", primarily used cocaine.  Has 3-5 year hx of HTN, no DM.  Gets HD on TTS schedule at [place]. Originally from [place].   . GI bleed 05/04/2012  . Head injury, closed, with concussion ([place])   . Headache(784.0)   . Helicobacter pylori gastritis    not defined if this was treated  . Hematochezia   . Hepatitis B core antibody positive    03/01/10  . Hepatitis C antibody test positive    was HIV negative, 02/28/12  . History of blood transfusion   . Hypercalcemia   . Hyperpotassemia   . Hypertension   . Hypertensive heart disease   . Optic neuritis, left   . Polyp of colon, adenomatous    May 2012.  Dr Trenton Founds in [place]  . Positive QuantiFERON-TB Gold  test    11/2011  . Protein-calorie malnutrition, severe ([place])   . Seizure disorder [facility])    questionable history of - will need to clarify with PCP  . Tobacco abuse     Patient Active Problem List   Diagnosis Date Noted  . Encephalopathy 12/10/2015  . Mental status change   . GI bleed 12/03/2015  . Lower GI bleed 12/03/2015  . Anemia in chronic kidney disease 09/17/2015  . Polysubstance abuse 09/17/2015  . HLD (hyperlipidemia) 09/17/2015  . Acute hyperkalemia   . ESRD on dialysis ([place])   . Physical deconditioning   . Constipation   . EKG, abnormal   . Alcoholism /alcohol abuse ([place])   . NSTEMI (non-ST elevated myocardial infarction) ([place]) 08/16/2015  . Hypertensive emergency 08/16/2015  . Hepatitis C antibody test positive 06/23/2015  . History of hepatitis B virus infection 06/20/2015  . Acute encephalopathy 06/20/2015  . Acute uremia   . Hyperkalemia   . Alcohol withdrawal ([place]) 06/19/2015  . Acute GI bleeding 04/12/2015  . LVH (left ventricular hypertrophy) due to hypertensive disease 04/12/2015  . Prolonged Q-T interval on ECG 04/12/2015  . Bleeding gastrointestinal   . Anemia of chronic kidney failure 04/26/2014  . Hyperparathyroidism, secondary renal ([place]) 04/26/2014  . Hematochezia   . Protein-calorie malnutrition, severe ([place]) 01/25/2014  .  Optic neuritis, left 01/19/2014  . Acute blood loss anemia 08/21/2013  . Cocaine abuse 03/02/2012  . Tobacco abuse 03/02/2012  . Hypertension   . ESRD on hemodialysis ([place])   . Seizure disorder [facility])     Past Surgical History:  Procedure Laterality Date  . [place] TRANSPOSITION  03/07/2012   Procedure: BASCILIC VEIN TRANSPOSITION;  Surgeon: Conrad [place], MD;  Location: [place];  Service: Vascular;  Laterality: Left;  First Stage  . BASCILIC VEIN TRANSPOSITION Left 05/31/2012   Procedure: BASCILIC VEIN TRANSPOSITION;  Surgeon: Conrad Benedict, MD;  Location: [facility];  Service: Vascular;  Laterality: Left;  Left 2nd Stage  Basilic Vein Transposition with gortex graft revision using 52mmx10cm graft  . [name] CAPSULE STUDY N/A 08/27/2013   Procedure: [name] CAPSULE STUDY;  Surgeon: Milus Banister, MD;  Location: [place];  Service: Endoscopy;  Laterality: N/A;  . INSERTION OF DIALYSIS CATHETER     right chest  . JOINT REPLACEMENT    . SHOULDER OPEN ROTATOR CUFF REPAIR Right   . TOTAL KNEE ARTHROPLASTY Left        Home Medications    Prior to Admission medications   Medication Sig Start Date End Date Taking? Authorizing Provider  amantadine (SYMMETREL) 100 MG capsule Take 100 mg by mouth 3 (three) times daily.    Historical Provider, MD  aspirin EC 81 MG EC tablet Take 1 tablet (81 mg total) by mouth daily. 08/20/15   Ripudeep Krystal Eaton, MD  atorvastatin (LIPITOR) 40 MG tablet Take 1 tablet (40 mg total) by mouth at bedtime. 08/20/15   Ripudeep Krystal Eaton, MD  B Complex-C-Folic Acid (RENA-VITE PO) Take 1 tablet by mouth daily.    Historical Provider, MD  calcium acetate (PHOSLO) 667 MG capsule Take 667 mg by mouth 3 (three) times daily with meals.     Historical Provider, MD  gabapentin (NEURONTIN) 100 MG capsule Take 200 mg by mouth every 12 (twelve) hours.     Historical Provider, MD  hydrALAZINE (APRESOLINE) 100 MG tablet Take 1 tablet (100 mg total) by mouth every 8 (eight) hours. 12/08/15   Orson Eva, MD  HYDROcodone-acetaminophen (NORCO/VICODIN) 5-325 MG tablet Take 1 tablet by mouth every 6 (six) hours as needed for moderate pain. 12/08/15   Orson Eva, MD  labetalol (NORMODYNE) 100 MG tablet Take 1 tablet (100 mg total) by mouth 3 (three) times daily. 12/08/15   Orson Eva, MD  levETIRAcetam (KEPPRA) 500 MG tablet Take 500 mg by mouth 2 (two) times daily.    Historical Provider, MD  losartan (COZAAR) 100 MG tablet Take 100 mg by mouth every evening. 10/02/14   Historical Provider, MD  Melatonin 3 MG TABS Take 3 mg by mouth at bedtime.    Historical Provider, MD  ondansetron (ZOFRAN) 4 MG tablet Take 4 mg by mouth  every 8 (eight) hours as needed for nausea or vomiting.    Historical Provider, MD  polyethylene glycol (MIRALAX / GLYCOLAX) packet Take 17 g by mouth daily. 08/26/15   Bonnell Public, MD  saccharomyces boulardii (FLORASTOR) 250 MG capsule Take 250 mg by mouth daily.    Historical Provider, MD  senna-docusate (SENOKOT-S) 8.6-50 MG tablet Take 1 tablet by mouth 2 (two) times daily. For constipation 08/21/15   Ripudeep Krystal Eaton, MD  sertraline (ZOLOFT) 25 MG tablet Take 25 mg by mouth daily.    Historical Provider, MD  verapamil (CALAN) 120 MG tablet Take 1 tablet (120 mg total) by mouth  every 8 (eight) hours. 08/20/15   Ripudeep Krystal Eaton, MD    Family History Family History  Problem Relation Age of Onset  . Diabetes Mother   . Hypertension Mother   . Stroke Mother   . Kidney failure Mother   . Cancer Father     Social History Social History  Substance Use Topics  . Smoking status: Current Every Day Smoker    Packs/day: 1.50    Years: 45.00    Types: Cigarettes  . Smokeless tobacco: Never Used  . Alcohol use 14.4 oz/week    24 Cans of beer per week     Comment: 1 40's per day     Allergies   Reglan [metoclopramide]   Review of Systems Review of Systems  Unable to perform ROS: Mental status change     Physical Exam Updated Vital Signs BP 180/69   Pulse 65   Temp 97.4 F (36.3 C) (Oral)   Resp 12   SpO2 100%   Physical Exam  Constitutional: He appears well-developed. No distress.  Thin and frail  HENT:  Head: Normocephalic.  Eyes: Pupils are equal, round, and reactive to light. No scleral icterus.  Pale conjunctiva  Neck: Normal range of motion. Neck supple. No thyromegaly present.  Cardiovascular: Normal rate and regular rhythm.  Exam reveals no gallop and no friction rub.   No murmur heard. Pulmonary/Chest: Effort normal and breath sounds normal. No respiratory distress. He has no wheezes. He has no rales.  Abdominal: Soft. Bowel sounds are normal. He exhibits no  distension. There is no tenderness. There is no rebound.  Musculoskeletal: Normal range of motion.  Neurological:  Lethargic. Moans. No appreciable words.  +gag.  Skin: Skin is warm and dry. No rash noted.  Psychiatric: He has a normal mood and affect. His behavior is normal.     ED Treatments / Results  Labs (all labs ordered are listed, but only abnormal results are displayed) Labs Reviewed  CBC WITH DIFFERENTIAL/PLATELET - Abnormal; Notable for the following:       Result Value   RBC 3.61 (*)    Hemoglobin 10.2 (*)    HCT 32.7 (*)    RDW 16.1 (*)    All other components within normal limits  COMPREHENSIVE METABOLIC PANEL - Abnormal; Notable for the following:    Sodium 131 (*)    Chloride 95 (*)    Glucose, Bld 60 (*)    BUN 25 (*)    Creatinine, Ser 8.28 (*)    Total Protein 5.9 (*)    Albumin 2.2 (*)    AST 13 (*)    ALT 7 (*)    GFR calc non Af Amer 6 (*)    GFR calc Af Amer 7 (*)    All other components within normal limits  ACETAMINOPHEN LEVEL - Abnormal; Notable for the following:    Acetaminophen (Tylenol), Serum <10 (*)    All other components within normal limits  I-STAT CHEM 8, ED - Abnormal; Notable for the following:    Sodium 134 (*)    Chloride 92 (*)    BUN 26 (*)    Creatinine, Ser 8.60 (*)    Glucose, Bld 60 (*)    Hemoglobin 11.6 (*)    HCT 34.0 (*)    All other components within normal limits  I-STAT CG4 LACTIC ACID, ED - Abnormal; Notable for the following:    Lactic Acid, Venous 0.46 (*)    All other components within  normal limits  I-STAT ARTERIAL BLOOD GAS, ED - Abnormal; Notable for the following:    pCO2 arterial 49.8 (*)    pO2, Arterial 74.0 (*)    Bicarbonate 29.3 (*)    Acid-Base Excess 3.0 (*)    All other components within normal limits  CULTURE, BLOOD (ROUTINE X 2)  CULTURE, BLOOD (ROUTINE X 2)  AMMONIA  BLOOD GAS, ARTERIAL  I-STAT CHEM 8, ED  I-STAT TROPOININ, ED  CBG MONITORING, ED    EKG  EKG  Interpretation  Date/Time:  Wednesday December 10 2015 12:02:58 EDT Ventricular Rate:  62 PR Interval:    QRS Duration: 116 QT Interval:  501 QTC Calculation: 509 R Axis:   90 Text Interpretation:  Sinus rhythm Borderline prolonged PR interval Probable left atrial enlargement Peaked T waves, ? HyperK+ Prolonged QT interval Confirmed by Jeneen Rinks  MD, [place] ([id number]) on 12/10/2015 12:23:09 PM       Radiology Ct Head Wo Contrast  Result Date: 12/10/2015 CLINICAL DATA:  Altered mental status.  Chronic renal failure EXAM: CT HEAD WITHOUT CONTRAST TECHNIQUE: Contiguous axial images were obtained from the base of the skull through the vertex without intravenous contrast. COMPARISON:  December 05, 2015 FINDINGS: Brain: The ventricles and sulci remain within normal limits for age. There is no intracranial mass, hemorrhage, extra-axial fluid collection, or midline shift. There is stable small vessel disease in the centra semiovale bilaterally, most notably anteriorly. Small vessel disease in the pons is also present and unchanged. There is no new gray-white compartment lesion evident. No acute infarct evident. Vascular: No hyperdense vessels are evident. Calcification in each carotid siphon and distal vertebral artery regions remains. Skull: Bony calvarium appears intact. Sinuses/Orbits: Visualized paranasal sinuses are clear. Orbits appear symmetric bilaterally. Orbits Other: Mastoid air cells are clear. IMPRESSION: Stable small vessel disease in supratentorial and infratentorial regions. No acute infarct evident. No mass, hemorrhage, or extra-axial fluid collection. There are multiple foci of arterial vascular calcification. Electronically Signed   By: Lowella Grip III M.D.   On: 12/10/2015 14:03   Dg Chest Port 1 View  Result Date: 12/10/2015 CLINICAL DATA:  Weakness and lethargy.  Mostly unresponsive. EXAM: PORTABLE CHEST 1 VIEW COMPARISON:  12/03/2015 FINDINGS: The cardio pericardial silhouette is  enlarged. There is pulmonary vascular congestion without overt pulmonary edema. Superimposed interstitial pulmonary edema likely. The visualized bony structures of the thorax are intact. Telemetry leads overlie the chest. IMPRESSION: Cardiomegaly with interstitial pulmonary edema. Electronically Signed   By: Misty Stanley M.D.   On: 12/10/2015 12:48    Procedures Procedures (including critical care time)  Medications Ordered in ED Medications  naloxone (NARCAN) injection 0.4 mg (0.4 mg Intravenous Given 12/10/15 1320)     Initial Impression / Assessment and Plan / ED Course  I have reviewed the triage vital signs and the nursing notes.  Pertinent labs & imaging results that were available during my care of the patient were reviewed by me and considered in my medical decision making (see chart for details).  Clinical Course    No clear explanation for patient's somnolence. I discussed the case with [facility] hospitalist. They're planning admission. Possiblities would include medication affects coupled with his hemodialysis. Has no acidosis. Is not febrile. Not probably septic. Not hypoxemic.  Final Clinical Impressions(s) / ED Diagnoses   Final diagnoses:  Somnolence    New Prescriptions New Prescriptions   No medications on file     Tanna Furry, MD 12/10/15 1650    Tanna Furry, MD  04/03/16 0706  

## 2015-12-10 NOTE — Consult Note (Signed)
Renal Service Consult Note [facility]  [name] 12/10/2015 [name] Requesting Physician:  Dr Marily Memos  Reason for Consult:  ESRD pt AMS and pulm edema HPI: The patient is a 60 y.o. year-old with hx of ESRD, seizure d/o, polysubstance abuse including ETOH, recurrent GIB, recent episodes of AMS in hospital, NSTEMI, HTN urgency.  DC'd 2 days ago and now returns with AMS. Sent from SNF.  No seizure activity.  In ED CXR shows sig pulm edema. Asked to see for HD.  Pt not giving any history.     Dec 2016 - GIB, left AMA Mar 2017 - AMS, chron etoh abuse, THC/ tobacco, etoh withdrawal/ tremors Apr 2017 - NSTEMI, seizure d/o, HTN urgency, etoh abuse, esrd May 2017 - hyperkalemia, ESRD, seizure d/o, severe PCM, deconditioning, polysubstance abuse Aug 16- 20, 2017 > recent R BKA at [facility], had GIB w EGD/ enteroscopy and colonscopy, and resp failure from narc OD in hospital. Admitted with ABL anemia, transfused 4u prbc, likely LGIB, no intervention. AMS likely multifact including PSA, HTN and severe anemia. MS worse with higher BP's.  CT head neg, NH3 and B12 ok.  May be developing some baseline cognitive impairment.  Improved to baseline and dc'd    ROS  denies CP  no joint pain   no HA  no blurry vision  no rash  no diarrhea  no nausea/ vomiting  no dysuria  no difficulty voiding  no change in urine color    Past Medical History  Past Medical History:  Diagnosis Date  . Anemia, chronic disease    a. Capsule endoscopy reportedly negative 08/2013. b. seen by heme with concern for minor B cell population on BMB, being observed.  . Arthritis    "right shoulder" (11/09/2012)  . Chronic diastolic CHF (congestive heart failure) ([place])    a. EF 60 - 65% per [place] echo 11/2011. b. Echo 02/2012: severe LVH, focal basal hypertrophy, EF 60-65%, mild Mr.  . Cocaine abuse    mentioned in notes from [place]  . ESRD (end stage renal disease) on dialysis [facility])     "TTS; [address]" (06/19/2015)  . ESRD on hemodialysis ([place]) since 2012   a. Since 2012. ESRD was due to "drugs", primarily used cocaine.  Has 3-5 year hx of HTN, no DM.  Gets HD on TTS schedule at [place]. Originally from [place].   . GI bleed 05/04/2012  . Head injury, closed, with concussion ([place])   . Headache(784.0)   . Helicobacter pylori gastritis    not defined if this was treated  . Hematochezia   . Hepatitis B core antibody positive    03/01/10  . Hepatitis C antibody test positive    was HIV negative, 02/28/12  . History of blood transfusion   . Hypercalcemia   . Hyperpotassemia   . Hypertension   . Hypertensive heart disease   . Optic neuritis, left   . Polyp of colon, adenomatous    May 2012.  Dr Trenton Founds in [place]  . Positive QuantiFERON-TB Gold test    11/2011  . Protein-calorie malnutrition, severe ([place])   . Seizure disorder [facility])    questionable history of - will need to clarify with PCP  . Tobacco abuse    Past Surgical History  Past Surgical History:  Procedure Laterality Date  . [place] TRANSPOSITION  03/07/2012   Procedure: BASCILIC VEIN TRANSPOSITION;  Surgeon: Conrad [place], MD;  Location: [place];  Service:  Vascular;  Laterality: Left;  First Stage  . BASCILIC VEIN TRANSPOSITION Left 05/31/2012   Procedure: BASCILIC VEIN TRANSPOSITION;  Surgeon: Conrad [place], MD;  Location: [place];  Service: Vascular;  Laterality: Left;  Left 2nd Stage Basilic Vein Transposition with gortex graft revision using 89mmx10cm graft  . [name] CAPSULE STUDY N/A 08/27/2013   Procedure: [name] CAPSULE STUDY;  Surgeon: Milus Banister, MD;  Location: [place];  Service: Endoscopy;  Laterality: N/A;  . INSERTION OF DIALYSIS CATHETER     right chest  . JOINT REPLACEMENT    . SHOULDER OPEN ROTATOR CUFF REPAIR Right   . TOTAL KNEE ARTHROPLASTY Left    Family History  Family History  Problem Relation Age of Onset  . Diabetes Mother   . Hypertension Mother   .  Stroke Mother   . Kidney failure Mother   . Cancer Father    Social History  reports that he has been smoking Cigarettes.  He has a 67.50 pack-year smoking history. He has never used smokeless tobacco. He reports that he drinks about 14.4 oz of alcohol per week . He reports that he uses drugs, including "Crack" cocaine, Cocaine, and Marijuana. Allergies  Allergies  Allergen Reactions  . Reglan [Metoclopramide] Other (See Comments)    Tardive dyskinesia in 11/2011 in [facility] medications Prior to Admission medications   Medication Sig Start Date End Date Taking? Authorizing Provider  amantadine (SYMMETREL) 100 MG capsule Take 100 mg by mouth 3 (three) times daily.    Historical Provider, MD  aspirin EC 81 MG EC tablet Take 1 tablet (81 mg total) by mouth daily. 08/20/15   Ripudeep Krystal Eaton, MD  atorvastatin (LIPITOR) 40 MG tablet Take 1 tablet (40 mg total) by mouth at bedtime. 08/20/15   Ripudeep Krystal Eaton, MD  B Complex-C-Folic Acid (RENA-VITE PO) Take 1 tablet by mouth daily.    Historical Provider, MD  calcium acetate (PHOSLO) 667 MG capsule Take 667 mg by mouth 3 (three) times daily with meals.     Historical Provider, MD  gabapentin (NEURONTIN) 100 MG capsule Take 200 mg by mouth every 12 (twelve) hours.     Historical Provider, MD  hydrALAZINE (APRESOLINE) 100 MG tablet Take 1 tablet (100 mg total) by mouth every 8 (eight) hours. 12/08/15   Orson Eva, MD  HYDROcodone-acetaminophen (NORCO/VICODIN) 5-325 MG tablet Take 1 tablet by mouth every 6 (six) hours as needed for moderate pain. 12/08/15   Orson Eva, MD  labetalol (NORMODYNE) 100 MG tablet Take 1 tablet (100 mg total) by mouth 3 (three) times daily. 12/08/15   Orson Eva, MD  levETIRAcetam (KEPPRA) 500 MG tablet Take 500 mg by mouth 2 (two) times daily.    Historical Provider, MD  losartan (COZAAR) 100 MG tablet Take 100 mg by mouth every evening. 10/02/14   Historical Provider, MD  Melatonin 3 MG TABS Take 3 mg by mouth at bedtime.     Historical Provider, MD  ondansetron (ZOFRAN) 4 MG tablet Take 4 mg by mouth every 8 (eight) hours as needed for nausea or vomiting.    Historical Provider, MD  polyethylene glycol (MIRALAX / GLYCOLAX) packet Take 17 g by mouth daily. 08/26/15   Bonnell Public, MD  saccharomyces boulardii (FLORASTOR) 250 MG capsule Take 250 mg by mouth daily.    Historical Provider, MD  senna-docusate (SENOKOT-S) 8.6-50 MG tablet Take 1 tablet by mouth 2 (two) times daily. For constipation 08/21/15   Ripudeep Krystal Eaton, MD  sertraline (ZOLOFT) 25 MG tablet Take 25 mg by mouth daily.    Historical Provider, MD  verapamil (CALAN) 120 MG tablet Take 1 tablet (120 mg total) by mouth every 8 (eight) hours. 08/20/15   Mendel Corning, MD   Liver Function Tests  Recent Labs Lab 12/04/15 [id number] 12/05/15 [id number] 12/06/15 0918 12/08/15 1104 12/10/15 1203  AST 19 17  --   --  13*  ALT 10* 11*  --   --  7*  ALKPHOS 59 68  --   --  66  BILITOT 1.2 0.8  --   --  0.9  PROT 5.6* 6.1*  --   --  5.9*  ALBUMIN 2.0* 2.1* 1.8* 1.9* 2.2*   No results for input(s): LIPASE, AMYLASE in the last 168 hours. CBC  Recent Labs Lab 12/07/15 0505 12/08/15 1104 12/10/15 1203 12/10/15 1223  WBC 5.5 6.3 5.2  --   NEUTROABS  --   --  3.0  --   HGB 10.0* 10.0* 10.2* 11.6*  HCT 32.7* 32.7* 32.7* 34.0*  MCV 90.8 91.9 90.6  --   PLT 164 175 170  --    Basic Metabolic Panel  Recent Labs Lab 12/04/15 0737 12/05/15 0948 12/06/15 0918 12/08/15 1104 12/10/15 1203 12/10/15 1223  NA 136 137 134* 133* 131* 134*  K 4.3 3.6 4.0 3.5 4.3 4.2  CL 98* 101 99* 96* 95* 92*  CO2 28 28 28 29 25   --   GLUCOSE 50* 77 98 118* 60* 60*  BUN 24* 10 14 12  25* 26*  CREATININE 5.94* 4.18* 5.51* 5.26* 8.28* 8.60*  CALCIUM 8.6* 8.9 8.7* 9.9 10.0  --   PHOS  --   --  5.3* 4.4  --   --    Iron/TIBC/Ferritin/ %Sat    Component Value Date/Time   IRON 90 08/25/2015 0958   TIBC 206 (L) 08/25/2015 0958   FERRITIN 514 (H) 04/06/2014 0106   IRONPCTSAT 44  (H) 08/25/2015 0958    Vitals:   12/10/15 1530 12/10/15 1545 12/10/15 1600 12/10/15 1615  BP: 182/70 183/73 188/73 180/69  Pulse: 65 66 66 65  Resp: 11 (!) 0 11 12  Temp:      TempSrc:      SpO2: 100% 91% 100% 100%   Exam Gen sonorous respirations, obtunded, not responding No rash, cyanosis or gangrene Sclera anicteric  +JVD Chest clear bilat  RRR no MRG Abd soft ntnd no mass or ascites +bs obese GU normal male MS R BKA wound w staples , healing well, no drainage/ erythema Ext 1+ UE edema / no wounds or ulcers Neuro as above, moves arms and legs spontaneously but not reponsding to commands LUA AVF +bruit, no drainage / erythema/ fluctuance    Dialysis: TTS AF  62.5kg  2/2.25 bath  P2  Hep none  LUA AVF  Assessment: 1.  AMS 2.  Pulm edema - vol overload on exam 3.  ESRD usual HD TTS 4.  Seizure d/o 5.  ETOH abuse 6.  Substance abuse hx 7.  HTN 8.  Recent R BKA - stump wound intact, no infection   Plan - HD tonight upstairs, max UF get vol down  Kelly Splinter MD [place] pager [phone]    cell [phone] 12/10/2015, 5:57 PM

## 2015-12-10 NOTE — Progress Notes (Signed)
Dialysis treatment completed.  4000 mL ultrafiltrated and net fluid removal 3500 mL.    Patient Alert, d/o to time, place, situation. Lung sounds diminished to ausculation in all fields. Generalized edema. Cardiac: NSR.  Disconnected lines and removed needles.  Pressure held for 10 minutes and band aid/gauze dressing applied.  Report given to bedside RN, Mickel Baas.

## 2015-12-10 NOTE — ED Notes (Signed)
Pt continues to be lethargic but will arouse to gentle stimuli.

## 2015-12-10 NOTE — ED Notes (Signed)
PT's CBG is [place] made aware and orders recieved

## 2015-12-10 NOTE — Progress Notes (Signed)
Patient arrived to unit per ED stretcher.  Reviewed treatment plan and this RN agrees.  Report received from bedside RN, Lambert Keto.  Consent obtained.  Patient lethargic, opens eyes to voice and touch. Lung sounds clear, diminished to ausculation in all fields. Generalized edema. Cardiac: NSR.  Prepped LUAVF with alcohol and cannulated with two 15 gauge needles.  Pulsation of blood noted.  Flushed access well with saline per protocol.  Connected and secured lines and initiated tx at 1915.  UF goal of 4000 mL and net fluid removal of 3500 mL.  Will continue to monitor.

## 2015-12-10 NOTE — ED Notes (Addendum)
[name] returned phone call-- does not know who "kiki" is. Pt has had issues with people in apartment with drugs-- is concerned regarding pt's well being with these other visitors--- requesting pt becomes XXX pt.  passcode for XXX == 1030 -- [name] is aware. Requesting social work consult.  Pt  Has told [name] that he fears for his safety due to drug dealings in area and in house, fears that people will come back and hurt him .   Dr. Barbaraann Faster made aware.

## 2015-12-10 NOTE — ED Notes (Signed)
Pt had a friend "kiki " come into room-- friend denies being family-- when asked how she found out about pt's where abouts-- states "someone called me"

## 2015-12-10 NOTE — ED Triage Notes (Signed)
Pt to ED via [facility] from [facility]-- with sttaff at SNF stating that pt has been too unresponsive to go to dialysis for 2 treatments. On arrival -- pt will respond to verbal and tactile stimulation,. Does know where he is, does not know why he is here.

## 2015-12-10 NOTE — Progress Notes (Signed)
Mickel Baas, RN transporting patient to room.

## 2015-12-10 NOTE — ED Notes (Signed)
Pt remains in x-ray at this time.

## 2015-12-10 NOTE — ED Notes (Signed)
Prior to giving D50 pt woke up.  When ask why he has been so sleepy.  Pt responded because I'm bored.  When ask why he did not go to dialysis yesterday pt st's because no one woke me up.

## 2015-12-11 ENCOUNTER — Inpatient Hospital Stay (HOSPITAL_COMMUNITY): Payer: Medicare Other

## 2015-12-11 LAB — COMPREHENSIVE METABOLIC PANEL
ALK PHOS: 64 U/L (ref 38–126)
ALT: 6 U/L — AB (ref 17–63)
AST: 15 U/L (ref 15–41)
Albumin: 2 g/dL — ABNORMAL LOW (ref 3.5–5.0)
Anion gap: 5 (ref 5–15)
BUN: 10 mg/dL (ref 6–20)
CALCIUM: 8.9 mg/dL (ref 8.9–10.3)
CHLORIDE: 97 mmol/L — AB (ref 101–111)
CO2: 32 mmol/L (ref 22–32)
CREATININE: 4.64 mg/dL — AB (ref 0.61–1.24)
GFR, EST AFRICAN AMERICAN: 15 mL/min — AB (ref >60)
GFR, EST NON AFRICAN AMERICAN: 13 mL/min — AB (ref >60)
Glucose, Bld: 111 mg/dL — ABNORMAL HIGH (ref 65–99)
Potassium: 3.4 mmol/L — ABNORMAL LOW (ref 3.5–5.1)
Sodium: 134 mmol/L — ABNORMAL LOW (ref 135–145)
Total Bilirubin: 0.6 mg/dL (ref 0.3–1.2)
Total Protein: 5.7 g/dL — ABNORMAL LOW (ref 6.5–8.1)

## 2015-12-11 LAB — PROTIME-INR
INR: 1.05
PROTHROMBIN TIME: 13.8 s (ref 11.4–15.2)

## 2015-12-11 LAB — CBC
HCT: 32.5 % — ABNORMAL LOW (ref 39.0–52.0)
Hemoglobin: 10.1 g/dL — ABNORMAL LOW (ref 13.0–17.0)
MCH: 28.1 pg (ref 26.0–34.0)
MCHC: 31.1 g/dL (ref 30.0–36.0)
MCV: 90.5 fL (ref 78.0–100.0)
PLATELETS: 165 10*3/uL (ref 150–400)
RBC: 3.59 MIL/uL — AB (ref 4.22–5.81)
RDW: 16.4 % — ABNORMAL HIGH (ref 11.5–15.5)
WBC: 3.9 10*3/uL — AB (ref 4.0–10.5)

## 2015-12-11 LAB — ETHANOL

## 2015-12-11 LAB — GLUCOSE, CAPILLARY
GLUCOSE-CAPILLARY: 95 mg/dL (ref 65–99)
Glucose-Capillary: 111 mg/dL — ABNORMAL HIGH (ref 65–99)
Glucose-Capillary: 51 mg/dL — ABNORMAL LOW (ref 65–99)
Glucose-Capillary: 68 mg/dL (ref 65–99)
Glucose-Capillary: 84 mg/dL (ref 65–99)
Glucose-Capillary: 94 mg/dL (ref 65–99)

## 2015-12-11 LAB — TSH: TSH: 3.752 u[IU]/mL (ref 0.350–4.500)

## 2015-12-11 LAB — CORTISOL: CORTISOL PLASMA: 9.5 ug/dL

## 2015-12-11 MED ORDER — BOOST / RESOURCE BREEZE PO LIQD: 1.0000 | Freq: Three times a day (TID) | ORAL | Status: DC

## 2015-12-11 MED ORDER — HYDRALAZINE HCL 20 MG/ML IJ SOLN: 10.0000 mg | Freq: Four times a day (QID) | INTRAMUSCULAR | Status: DC | PRN

## 2015-12-11 MED ORDER — DEXTROSE 10 % IV SOLN
INTRAVENOUS | Status: DC
  Administered 2015-12-11: 03:00:00 via INTRAVENOUS

## 2015-12-11 MED ORDER — SODIUM CHLORIDE 0.9% FLUSH: 10.0000 mL | INTRAVENOUS | Status: DC | PRN

## 2015-12-11 MED ORDER — ENSURE ENLIVE PO LIQD: 237.0000 mL | Freq: Two times a day (BID) | ORAL | Status: DC

## 2015-12-11 NOTE — Progress Notes (Signed)
Hypoglycemic Event  CBG: 51  Treatment: 15 GM carbohydrate snack  Symptoms: None  Follow-up CBG: Time:0113 CBG Result:84  Possible Reasons for Event: Unknown    [name], Sherlon Handing

## 2015-12-11 NOTE — Progress Notes (Signed)
[facility] Progress Note   Subjective: much more alert today, still groggy and a bit confused, eating breakfast and falling asleep  Vitals:   12/10/15 2250 12/10/15 2355 12/11/15 0120 12/11/15 0524  BP: (!) 174/73 (!) 199/68 (!) 161/59 (!) 147/72  Pulse: 66 71  (!) 56  Resp:  16  18  Temp:  97.6 F (36.4 C)  97.6 F (36.4 C)  TempSrc:  Oral  Oral  SpO2:  99%  100%  Weight:  57.7 kg (127 lb 3.2 oz)    Height:  5\' 8"  (1.727 m)      Inpatient medications: . aspirin EC  81 mg Oral Daily  . atorvastatin  40 mg Oral QHS  . calcium acetate  667 mg Oral TID WC  . feeding supplement (ENSURE ENLIVE)  237 mL Oral BID BM  . gabapentin  200 mg Oral Q12H  . heparin  5,000 Units Subcutaneous Q8H  . labetalol  100 mg Oral TID  . levETIRAcetam  500 mg Oral BID  . losartan  100 mg Oral QPM  . saccharomyces boulardii  250 mg Oral Daily  . verapamil  120 mg Oral Q8H   . dextrose 30 mL/hr at 12/11/15 0302   acetaminophen **OR** acetaminophen, bisacodyl, magnesium citrate, senna-docusate, sodium chloride flush  Exam: Alert, no distress, groggy No jvd Chest clear bilat RRR no mrg Abd soft ntnd Ext R BKA well healed 1+ edema LE"s Neuro nonfocal, more alert LUA AVF +bruit  Dialysis: TTS AF  62.5kg  2/2.25 bath  P2  Hep none  LUA AVF      Assessment: 1.  AMS - improving, suspect substance abuse, long hx of same 2.  Pulm edema / vol overload - lowering dry wt, is below already 3.  ESRD usual HD TTS 4.  Seizure d/o 5.  ETOH abuse 6.  Substance abuse hx 7.  HTN bp's still high 8.  Recent R BKA - clean wounds  Plan - HD again today, max UF   Kelly Splinter MD [place] pager [phone]    cell [phone] 12/11/2015, 9:32 AM    Recent Labs Lab 12/06/15 0918 12/08/15 1104 12/10/15 1203 12/10/15 1223 12/11/15 0450  NA 134* 133* 131* 134* 134*  K 4.0 3.5 4.3 4.2 3.4*  CL 99* 96* 95* 92* 97*  CO2 28 29 25   --  32  GLUCOSE 98 118* 60* 60*  111*  BUN 14 12 25* 26* 10  CREATININE 5.51* 5.26* 8.28* 8.60* 4.64*  CALCIUM 8.7* 9.9 10.0  --  8.9  PHOS 5.3* 4.4  --   --   --     Recent Labs Lab 12/05/15 0948  12/08/15 1104 12/10/15 1203 12/11/15 0450  AST 17  --   --  13* 15  ALT 11*  --   --  7* 6*  ALKPHOS 68  --   --  66 64  BILITOT 0.8  --   --  0.9 0.6  PROT 6.1*  --   --  5.9* 5.7*  ALBUMIN 2.1*  < > 1.9* 2.2* 2.0*  < > = values in this interval not displayed.  Recent Labs Lab 12/08/15 1104 12/10/15 1203 12/10/15 1223 12/11/15 0450  WBC 6.3 5.2  --  3.9*  NEUTROABS  --  3.0  --   --   HGB 10.0* 10.2* 11.6* 10.1*  HCT 32.7* 32.7* 34.0* 32.5*  MCV 91.9 90.6  --  90.5  PLT 175 170  --  165   Iron/TIBC/Ferritin/ %Sat    Component Value Date/Time   IRON 90 08/25/2015 0958   TIBC 206 (L) 08/25/2015 0958   FERRITIN 514 (H) 04/06/2014 0106   IRONPCTSAT 44 (H) 08/25/2015 [id number]

## 2015-12-11 NOTE — Progress Notes (Signed)
Hypoglycemic Event  CBG: 57  Treatment: 15 GM carbohydrate snack  Symptoms: None  Follow-up CBG: Time:0034 CBG Result:51  Possible Reasons for Event: Unknown   Breylon Sherrow, Sherlon Handing

## 2015-12-11 NOTE — Progress Notes (Signed)
PROGRESS NOTE                                                                                                                                                                                                             Patient Demographics:    [name], is a 60 y.o. male, DOB - Dec 19, 1955, [id number]  Admit date - 12/10/2015   Admitting Physician Waldemar Dickens, MD  Outpatient Primary MD for the patient is No PCP Per Patient  LOS - 1  Chief Complaint  Patient presents with  . Weakness  . Altered Mental Status       Brief Narrative   [name] is a 60 y.o. male with medical history significant for ESRD on hemodialysis Tuesday Thursday Saturday, last on 8/21, hypertension, seizure disorder, diastolic heart failure, chronic anemia on Aranesp Q Thursday  with HD polysubstance abuse including cocaine and tobacco,recently history of sepsis secondary to necrotic right foot undergoing right BKA, discharged on 11/26/2015, recently hospitalized from 66 through 12/07/2015 due to GI bleed requiring transfusion of 4 units of blood, brought from his skilled nursing facility due to acute mental status changes. According to the nurse at the facility, the patient was in his usual state of health until this morning. He was apparently at his baseline behavioral health status. Of note, during prior to admission, the patient deet have an episode of acute encephalopathy, which was felt to be multifactorial, including recent illicit substance abuse, elevated blood pressure, and profound anemia from ABLA. Studies at the time included EEG, showing diffuse cerebral dysfunction, negative CT, unremarkable ammonia B-12.   At the time of discharge, the patient was baseline alert and conversant. Today, he appears to have similar symptoms .Patient was unresponsive at the time of admission, and continues to be unable to engage in conversation.  He does respond to verbal and tactile stimulation. Other ROS cannot be obtained at this time      Subjective:    [name] today has, No headache, No chest pain, No abdominal pain - No Nausea, No new weakness tingling or numbness, No Cough - SOB.     Assessment  & Plan :     1.Encephalopathy. Suspicious for polypharmacy however patient is anuric hence urine drug screen was not ordered in the ER, does have history of seizures, CT head nonacute,  will get EEG, mentation is improving with supportive care will continue to monitor minimize benzodiazepines and narcotics.  2. HX of seizures. Check EEG continue Keppra for now.  3. Acute on chronic diastolic CHF in a patient with ESRD - was dialyzed upon admission, renal following, continue fluid removal with dialysis, on Tuesday, Thursday and Saturday schedule.  4. ESRD. As a #3 above.  5. Recent right BKA. Site clean.  6. Essential hypertension. Continue home medications added as needed hydralazine IV.  7. Recent history of acute lower GI bleed. Stable will monitor.   8. Dyslipidemia. Continue statin.   9. Mild hypoglycemia upon admission. Likely due to poor oral intake, check TSH, cortisol and monitor CBGs every 6.   CBG (last 3)   Recent Labs  12/11/15 0113 12/11/15 0519 12/11/15 0722  GLUCAP 84 111* 95     Family Communication  :  None present  Code Status :  Full  Diet : Renal  Disposition Plan  :  SNF 1-2 days  Consults  :  Renal  Procedures  :    CT head nonacute  EEG  DVT Prophylaxis  :   Heparin   Lab Results  Component Value Date   PLT 165 12/11/2015    Inpatient Medications  Scheduled Meds: . aspirin EC  81 mg Oral Daily  . atorvastatin  40 mg Oral QHS  . calcium acetate  667 mg Oral TID WC  . feeding supplement (ENSURE ENLIVE)  237 mL Oral BID BM  . gabapentin  200 mg Oral Q12H  . heparin  5,000 Units Subcutaneous Q8H  . labetalol  100 mg Oral TID  . levETIRAcetam  500 mg Oral BID    . losartan  100 mg Oral QPM  . saccharomyces boulardii  250 mg Oral Daily  . verapamil  120 mg Oral Q8H   Continuous Infusions: . dextrose 30 mL/hr at 12/11/15 0302   PRN Meds:.acetaminophen **OR** acetaminophen, bisacodyl, magnesium citrate, senna-docusate, sodium chloride flush  Antibiotics  :    Anti-infectives    None         Objective:   Vitals:   12/10/15 2250 12/10/15 2355 12/11/15 0120 12/11/15 0524  BP: (!) 174/73 (!) 199/68 (!) 161/59 (!) 147/72  Pulse: 66 71  (!) 56  Resp:  16  18  Temp:  97.6 F (36.4 C)  97.6 F (36.4 C)  TempSrc:  Oral  Oral  SpO2:  99%  100%  Weight:  57.7 kg (127 lb 3.2 oz)    Height:  5\' 8"  (1.727 m)      Wt Readings from Last 3 Encounters:  12/10/15 57.7 kg (127 lb 3.2 oz)  12/07/15 60.7 kg (133 lb 13.1 oz)  09/18/15 64.1 kg (141 lb 5 oz)     Intake/Output Summary (Last 24 hours) at 12/11/15 0939 Last data filed at 12/11/15 0552  Gross per 24 hour  Intake              195 ml  Output             3500 ml  Net            -3305 ml     Physical Exam  Awake, Mildly somnolent but oriented 2, No new F.N deficits, Normal affect [place].AT,PERRAL Supple Neck,No JVD, No cervical lymphadenopathy appriciated.  Symmetrical Chest wall movement, Good air movement bilaterally, CTAB RRR,No Gallops,Rubs or new Murmurs, No Parasternal Heave +ve B.Sounds, Abd Soft, No tenderness, No organomegaly  appriciated, No rebound - guarding or rigidity. No Cyanosis, Clubbing or edema, No new Rash or bruise, R BKA stump site clean    Data Review:    CBC  Recent Labs Lab 12/06/15 0918 12/07/15 0505 12/08/15 1104 12/10/15 1203 12/10/15 1223 12/11/15 0450  WBC 5.9 5.5 6.3 5.2  --  3.9*  HGB 9.7* 10.0* 10.0* 10.2* 11.6* 10.1*  HCT 31.4* 32.7* 32.7* 32.7* 34.0* 32.5*  PLT 175 164 175 170  --  165  MCV 90.5 90.8 91.9 90.6  --  90.5  MCH 28.0 27.8 28.1 28.3  --  28.1  MCHC 30.9 30.6 30.6 31.2  --  31.1  RDW 16.4* 15.9* 16.0* 16.1*  --  16.4*   LYMPHSABS  --   --   --  1.1  --   --   MONOABS  --   --   --  0.7  --   --   EOSABS  --   --   --  0.3  --   --   BASOSABS  --   --   --  0.0  --   --     Chemistries   Recent Labs Lab 12/05/15 0948 12/06/15 0918 12/08/15 1104 12/10/15 1203 12/10/15 1223 12/11/15 0450  NA 137 134* 133* 131* 134* 134*  K 3.6 4.0 3.5 4.3 4.2 3.4*  CL 101 99* 96* 95* 92* 97*  CO2 28 28 29 25   --  32  GLUCOSE 77 98 118* 60* 60* 111*  BUN 10 14 12  25* 26* 10  CREATININE 4.18* 5.51* 5.26* 8.28* 8.60* 4.64*  CALCIUM 8.9 8.7* 9.9 10.0  --  8.9  AST 17  --   --  13*  --  15  ALT 11*  --   --  7*  --  6*  ALKPHOS 68  --   --  66  --  64  BILITOT 0.8  --   --  0.9  --  0.6   ------------------------------------------------------------------------------------------------------------------ No results for input(s): CHOL, HDL, LDLCALC, TRIG, CHOLHDL, LDLDIRECT in the last 72 hours.  Lab Results  Component Value Date   HGBA1C 5.0 11/07/2012   ------------------------------------------------------------------------------------------------------------------ No results for input(s): TSH, T4TOTAL, T3FREE, THYROIDAB in the last 72 hours.  Invalid input(s): FREET3 ------------------------------------------------------------------------------------------------------------------ No results for input(s): VITAMINB12, FOLATE, FERRITIN, TIBC, IRON, RETICCTPCT in the last 72 hours.  Coagulation profile  Recent Labs Lab 12/11/15 0450  INR 1.05    No results for input(s): DDIMER in the last 72 hours.  Cardiac Enzymes No results for input(s): CKMB, TROPONINI, MYOGLOBIN in the last 168 hours.  Invalid input(s): CK ------------------------------------------------------------------------------------------------------------------    Component Value Date/Time   BNP 157.1 (H) 08/16/2015 2256    Micro Results Recent Results (from the past 240 hour(s))  Blood Culture (routine x 2)     Status: None    Collection Time: 12/03/15  2:50 PM  Result Value Ref Range Status   Specimen Description BLOOD BLOOD RIGHT FOREARM  Final   Special Requests BOTTLES DRAWN AEROBIC AND ANAEROBIC 5ML  Final   Culture NO GROWTH 5 DAYS  Final   Report Status 12/08/2015 FINAL  Final  Blood Culture (routine x 2)     Status: None   Collection Time: 12/03/15  3:18 PM  Result Value Ref Range Status   Specimen Description BLOOD LEFT HAND  Final   Special Requests BOTTLES DRAWN AEROBIC AND ANAEROBIC 5CC  Final   Culture NO GROWTH 5 DAYS  Final  Report Status 12/08/2015 FINAL  Final  MRSA PCR Screening     Status: None   Collection Time: 12/04/15 12:52 AM  Result Value Ref Range Status   MRSA by PCR NEGATIVE NEGATIVE Final    Comment:        The GeneXpert MRSA Assay (FDA approved for NASAL specimens only), is one component of a comprehensive MRSA colonization surveillance program. It is not intended to diagnose MRSA infection nor to guide or monitor treatment for MRSA infections.     Radiology Reports X-ray Chest Pa And Lateral  Result Date: 12/11/2015 CLINICAL DATA:  Interstitial edema EXAM: CHEST  2 VIEW COMPARISON:  Portable chest x-ray of December 10, 2015 FINDINGS: The lungs are adequately inflated. There are coarse lung markings at both bases. These are more conspicuous on the right but are stable on the left. The left hemidiaphragm is slightly better demonstrated today. There are pleural effusions layering posteriorly, bilaterally. The cardiac silhouette remains enlarged. The central pulmonary vascularity is prominent. There is calcification in the wall of the aortic arch. IMPRESSION: CHF with mild interstitial edema and bilateral pleural effusions. Bibasilar atelectasis greatest on the left. There has not been significant interval change since yesterday's study. Electronically Signed   By: David  Martinique M.D.   On: 12/11/2015 07:18   Ct Head Wo Contrast  Result Date: 12/10/2015 CLINICAL DATA:  Altered  mental status.  Chronic renal failure EXAM: CT HEAD WITHOUT CONTRAST TECHNIQUE: Contiguous axial images were obtained from the base of the skull through the vertex without intravenous contrast. COMPARISON:  December 05, 2015 FINDINGS: Brain: The ventricles and sulci remain within normal limits for age. There is no intracranial mass, hemorrhage, extra-axial fluid collection, or midline shift. There is stable small vessel disease in the centra semiovale bilaterally, most notably anteriorly. Small vessel disease in the pons is also present and unchanged. There is no new gray-white compartment lesion evident. No acute infarct evident. Vascular: No hyperdense vessels are evident. Calcification in each carotid siphon and distal vertebral artery regions remains. Skull: Bony calvarium appears intact. Sinuses/Orbits: Visualized paranasal sinuses are clear. Orbits appear symmetric bilaterally. Orbits Other: Mastoid air cells are clear. IMPRESSION: Stable small vessel disease in supratentorial and infratentorial regions. No acute infarct evident. No mass, hemorrhage, or extra-axial fluid collection. There are multiple foci of arterial vascular calcification. Electronically Signed   By: Lowella Grip III M.D.   On: 12/10/2015 14:03      Dg Chest Port 1 View  Result Date: 12/10/2015 CLINICAL DATA:  Weakness and lethargy.  Mostly unresponsive. EXAM: PORTABLE CHEST 1 VIEW COMPARISON:  12/03/2015 FINDINGS: The cardio pericardial silhouette is enlarged. There is pulmonary vascular congestion without overt pulmonary edema. Superimposed interstitial pulmonary edema likely. The visualized bony structures of the thorax are intact. Telemetry leads overlie the chest. IMPRESSION: Cardiomegaly with interstitial pulmonary edema. Electronically Signed   By: Misty Stanley M.D.   On: 12/10/2015 12:48     Dg Knee Complete 4 Views Right  Result Date: 12/10/2015 CLINICAL DATA:  History of right below-the-knee amputation. Recent  fevers. Question osteomyelitis. EXAM: RIGHT KNEE - COMPLETE 4+ VIEW COMPARISON:  None. FINDINGS: Surgical staples are in place at the patient's below-the-knee amputation. No soft tissue gas collection is identified. No bony destructive change is seen. There is no fracture. Atherosclerosis is noted. IMPRESSION: Status post right BKA.  No acute abnormality. Electronically Signed   By: Inge Rise M.D.   On: 12/10/2015 16:44    Time Spent in minutes  30   Thurnell Lose M.D on 12/11/2015 at 9:39 AM  Between 7am to 7pm - Pager - [phone]  After 7pm go to [email or web address] - password [facility]  [facility] -  Office  [phone]

## 2015-12-11 NOTE — Progress Notes (Signed)
Pt in HD and unavailable for EEG

## 2015-12-11 NOTE — Progress Notes (Signed)
Initial Nutrition Assessment  DOCUMENTATION CODES:   Severe malnutrition in context of chronic illness  INTERVENTION:  Discontinue Ensure.   Provide Boost Breeze po TID, each supplement provides 250 kcal and 9 grams of protein.  Provide nourishment snack (Ordered).  Encourage adequate PO intake.   NUTRITION DIAGNOSIS:   Malnutrition related to chronic illness as evidenced by severe depletion of body fat, severe depletion of muscle mass.  GOAL:   Patient will meet greater than or equal to 90% of their needs  MONITOR:   PO intake, Supplement acceptance, Labs, Weight trends, Skin, I & O's  REASON FOR ASSESSMENT:   Malnutrition Screening Tool    ASSESSMENT:   60 y.o. male with medical history significant for ESRD on hemodialysis Tuesday Thursday Saturday, last on 8/21, hypertension, seizure disorder, diastolic heart failure, chronic anemia on Aranesp Q Thursday  with HD polysubstance abuse including cocaine and tobacco,recently history of sepsis secondary to necrotic right foot underwent right BKA. Presents with weakness and altered mental status.  Meal completion 80% this AM at breakfast. Pt reports having a lack of appetite which has been ongoing over the past 4-5 days. Pt reports usual consumption of at least 3 meals a day. Usual body weight unknown to patient. Per Epic weight records, weight has been fluctuating likely related to fluid status. Pt currently has Ensure ordered. Pt reports disliking Ensure. RD to discontinue the orders and order Boost Breeze instead. Pt agreeable to nourishment snacks at HS to aid in caloric and protein needs. RD to order.   Nutrition-Focused physical exam completed. Findings are severe fat depletion, severe muscle depletion, and mild edema.   Labs and medications reviewed.   Diet Order:  Diet renal with fluid restriction Fluid restriction: 1200 mL Fluid; Room service appropriate? Yes; Fluid consistency: Thin  Skin:   (L knee  abrasion)  Last BM:  8/22  Height:   Ht Readings from Last 1 Encounters:  12/10/15 5\' 8"  (1.727 m)    Weight:   Wt Readings from Last 1 Encounters:  12/11/15 127 lb 3.3 oz (57.7 kg)    Ideal Body Weight:  65.45 kg (adjusted for R BKA)  BMI:  Body mass index is 19.34 kg/m.  Estimated Nutritional Needs:   Kcal:  1750-2000  Protein:  80-90 grams  Fluid:  1.2 L/day  EDUCATION NEEDS:   No education needs identified at this time  Corrin Parker, MS, RD, LDN Pager # [phone] After hours/ weekend pager # [phone]

## 2015-12-11 NOTE — Progress Notes (Signed)
Admission note:  Arrival Method: Patient arrived on stretcher from hemodialysis Mental Orientation: Alert and oriented to person, place and situation. Disoriented to time Telemetry: Telemetry box 6e02 applied. CCMT notified Assessment: Completed, see flowsheets Skin: Healing right BKA. Scab noted to patient's left knee. Skin assessed with Mady Gemma, RN IV: No IV present upon patient's arrival to floor. IV team notified.  Pain: Patient states no pain at this time Tubes: Left arm fistula. Positive for thrill and bruit Safety Measures: Bed in lowest position, non-slip socks placed, call light within reach, and bed alarm activated  Fall Prevention Safety Plan: Reviewed with patient  Admission Screening: Completed 6700 Orientation: Patient has been oriented to the unit, staff and to the room. Orders have been reviewed and implemented. Patient lying in bed with no needs stated at this time. Call light within reach, will continue to monitor the patient closely.   Shelbie Hutching, RN, BSN

## 2015-12-11 NOTE — Progress Notes (Signed)
12/11/15 1000  PT Visit Information  Last PT Received On 12/11/15  Assistance Needed +2  History of Present Illness 60 yo male with recent R BK amputation with staples in was admitted from SNF with AMS and pleural effusion, CHF and cleared for osteomyelitis on RLE.  PMHx:  polysubstance abuse, NSTEMI, GI bleed, ESRD on HD, seizures,   Precautions  Precautions Fall  Restrictions  Weight Bearing Restrictions Yes  RLE Weight Bearing NWB  Home Living  Family/patient expects to be discharged to: Skilled nursing facility  Prior Function  Level of [place] Needs assistance  Gait / Transfers Assistance Needed reports that staff has been helping him transfer to w/c  ADL's / [place] in SNF for help  Communication  Communication Expressive difficulties (mumbling some words)  Pain Assessment  Pain Assessment Faces  Faces Pain Scale 4  Pain Location R leg  Pain Intervention(s) Monitored during session;Repositioned  Cognition  Arousal/Alertness Awake/alert  Behavior During Therapy Flat affect  Overall Cognitive Status History of cognitive impairments - at baseline  Memory Decreased recall of precautions;Decreased short-term memory  Upper Extremity Assessment  Upper Extremity Assessment Generalized weakness  Lower Extremity Assessment  Lower Extremity Assessment Generalized weakness  Cervical / Trunk Assessment  Cervical / Trunk Assessment Kyphotic  Bed Mobility  Overal bed mobility Needs Assistance  Bed Mobility Supine to Sit;Sit to Supine  Supine to sit Mod assist  Sit to supine Mod assist;Max assist  General bed mobility comments used bed pad to assist and protect the RLE  Transfers  Overall transfer level Needs assistance  Equipment used Rolling walker (2 wheeled);1 person hand held assist  Transfers Sit to/from Stand  Sit to Stand Max assist;From elevated surface (to partially stand)  General transfer comment pt was so weak he could not fully stand on  LLE but has just begun therapy in SNF  Ambulation/Gait  General Gait Details not able  Balance  Overall balance assessment Needs assistance  Sitting-balance support Feet supported  Sitting balance-Leahy Scale Fair  Standing balance support Bilateral upper extremity supported  Standing balance-Leahy Scale Zero  General Comments  General comments (skin integrity, edema, etc.) Pt has staples in R stump but clean and no drainage noted  Exercises  Exercises Other exercises (strength in LLE was 4- to 4+)  PT - End of Session  Equipment Utilized During Treatment Gait belt  Activity Tolerance Patient tolerated treatment well  Patient left in bed;with call bell/phone within reach;with bed alarm set  Nurse Communication Mobility status  PT Assessment  PT Therapy Diagnosis  Difficulty walking;Generalized weakness;Acute pain  PT Recommendation/Assessment Patient needs continued PT services  PT Problem List Decreased strength;Decreased range of motion;Decreased activity tolerance;Decreased balance;Decreased mobility;Decreased coordination;Decreased cognition;Decreased knowledge of use of DME;Decreased safety awareness;Decreased knowledge of precautions;Cardiopulmonary status limiting activity;Decreased skin integrity;Pain  Barriers to Discharge Other (comment) (has plan to return to SNF but home alone all level)  PT Plan  PT Frequency (ACUTE ONLY) Min 2X/week  PT Treatment/Interventions (ACUTE ONLY) DME instruction;Gait training;Functional mobility training;Therapeutic activities;Therapeutic exercise;Balance training;Neuromuscular re-education;Patient/family education  PT Recommendation  Recommendations for Other Services Rehab consult  Follow Up Recommendations SNF  PT equipment None recommended by PT (owns a SPC and RW)  Acute Rehab PT Goals  Patient Stated Goal prosthesis  PT Time Calculation  PT Start Time (ACUTE ONLY) 0939  PT Stop Time (ACUTE ONLY) 1007  PT Time Calculation (min)  (ACUTE ONLY) 28 min  PT General Charges  $$ ACUTE PT VISIT 1  Procedure  PT Evaluation  $PT Eval Moderate Complexity 1 Procedure  PT Treatments  $Therapeutic Activity 8-22 mins  Written Expression  Dominant Hand Right  Mee Hives, PT MS Acute Rehab Dept. Number: [place] and [place]

## 2015-12-12 ENCOUNTER — Inpatient Hospital Stay (HOSPITAL_COMMUNITY): Payer: Medicare Other

## 2015-12-12 DIAGNOSIS — G9340 Encephalopathy, unspecified: Principal | ICD-10-CM

## 2015-12-12 LAB — GLUCOSE, CAPILLARY
GLUCOSE-CAPILLARY: 85 mg/dL (ref 65–99)
GLUCOSE-CAPILLARY: 119 mg/dL — AB (ref 65–99)
Glucose-Capillary: 153 mg/dL — ABNORMAL HIGH (ref 65–99)
Glucose-Capillary: 80 mg/dL (ref 65–99)

## 2015-12-12 MED ORDER — HYDROCODONE-ACETAMINOPHEN 5-325 MG PO TABS: 1.0000 | ORAL_TABLET | Freq: Four times a day (QID) | ORAL | 0 refills | Status: DC | PRN

## 2015-12-12 NOTE — Progress Notes (Signed)
[facility] Progress Note  Assessment/Plan: 1.  AMS - improving, suspect substance abuse, long hx of same - EEG today  with diffuse cerebral dysfunction non specific etiology can be seen with hypoxic/ischemic injury, toxic/metabolic encephalopathies 2. Pulm edema / vol overload - lowering dry wt, is below already 3. ESRD usual HD TTS - cont on schedule post weight yesterday 57.7 kg net UF  4. Anemia - Hgb 10.1 stable - on OP ESA  5. Seizure d/o - cont Keppra 6. ETOH abuse 7. Substance abuse hx 8. HTN-  Bps elevated - continue OP meds  9.  Recent R BKA - clean wounds  Lynnda Child PA-C [place] 12/12/2015,11:59 AM  LOS: 2 days   Pt seen, examined and agree w A/P as above. F/U CXR today clear. OK for dc.  Kelly Splinter MD [facility] pager [phone]    cell [phone] 12/12/2015, 4:42 PM    Subjective:  Alert, sitting up in bed, conversant. Oriented to surroundings. No new concerns today. For discharge today.   Objective Vitals:   12/11/15 1723 12/11/15 2035 12/12/15 0515 12/12/15 0900  BP: (!) 118/52 (!) 157/65 (!) 152/64 (!) 146/49  Pulse: 70 71 68 (!) 58  Resp: 18 20 16 18   Temp: 97.8 F (36.6 C) 99.1 F (37.3 C) 98.5 F (36.9 C) 98.5 F (36.9 C)  TempSrc: Oral Oral Oral Oral  SpO2: 98% 99% 100% 100%  Weight:  57.6 kg (127 lb 1.5 oz)    Height:       Physical Exam General: Elderly male, alert NAD Heart: RRR S1 S2 Lungs:CTAB bilat Abdomen: soft, ntnd Extremities: R BKA with staples - no erythema drainage no LLE edema Dialysis Access: LUA AVF +thrill  Dialysis Orders: TTS AF 62.5kg 2/2.25 bath P2 Hep none LUA AVF  Additional Objective Labs: Basic Metabolic Panel:  Recent Labs Lab 12/06/15 0918 12/08/15 1104 12/10/15 1203 12/10/15 1223 12/11/15 0450  NA 134* 133* 131* 134* 134*  K 4.0 3.5 4.3 4.2 3.4*  CL 99* 96* 95* 92* 97*  CO2 28 29 25   --  32  GLUCOSE 98 118* 60* 60* 111*  BUN 14  12 25* 26* 10  CREATININE 5.51* 5.26* 8.28* 8.60* 4.64*  CALCIUM 8.7* 9.9 10.0  --  8.9  PHOS 5.3* 4.4  --   --   --    Liver Function Tests:  Recent Labs Lab 12/08/15 1104 12/10/15 1203 12/11/15 0450  AST  --  13* 15  ALT  --  7* 6*  ALKPHOS  --  66 64  BILITOT  --  0.9 0.6  PROT  --  5.9* 5.7*  ALBUMIN 1.9* 2.2* 2.0*   No results for input(s): LIPASE, AMYLASE in the last 168 hours. CBC:  Recent Labs Lab 12/06/15 0918 12/07/15 0505 12/08/15 1104 12/10/15 1203 12/10/15 1223 12/11/15 0450  WBC 5.9 5.5 6.3 5.2  --  3.9*  NEUTROABS  --   --   --  3.0  --   --   HGB 9.7* 10.0* 10.0* 10.2* 11.6* 10.1*  HCT 31.4* 32.7* 32.7* 32.7* 34.0* 32.5*  MCV 90.5 90.8 91.9 90.6  --  90.5  PLT 175 164 175 170  --  165   Blood Culture    Component Value Date/Time   SDES BLOOD RIGHT HAND 12/10/2015 1315   SPECREQUEST BOTTLES DRAWN AEROBIC ONLY 5CC 12/10/2015 1315   CULT NO GROWTH < 24 HOURS 12/10/2015 1315   REPTSTATUS PENDING 12/10/2015 1315  Cardiac Enzymes: No results for input(s): CKTOTAL, CKMB, CKMBINDEX, TROPONINI in the last 168 hours. CBG:  Recent Labs Lab 12/11/15 0722 12/11/15 1611 12/11/15 2033 12/12/15 0806 12/12/15 1158  GLUCAP 95 68 94 85 153*   Iron Studies: No results for input(s): IRON, TIBC, TRANSFERRIN, FERRITIN in the last 72 hours. Lab Results  Component Value Date   INR 1.05 12/11/2015   INR 1.23 12/03/2015   INR 0.96 08/16/2015   Studies/Results: X-ray Chest Pa And Lateral  Result Date: 12/11/2015 CLINICAL DATA:  Interstitial edema EXAM: CHEST  2 VIEW COMPARISON:  Portable chest x-ray of December 10, 2015 FINDINGS: The lungs are adequately inflated. There are coarse lung markings at both bases. These are more conspicuous on the right but are stable on the left. The left hemidiaphragm is slightly better demonstrated today. There are pleural effusions layering posteriorly, bilaterally. The cardiac silhouette remains enlarged. The central pulmonary  vascularity is prominent. There is calcification in the wall of the aortic arch. IMPRESSION: CHF with mild interstitial edema and bilateral pleural effusions. Bibasilar atelectasis greatest on the left. There has not been significant interval change since yesterday's study. Electronically Signed   By: David  Martinique M.D.   On: 12/11/2015 07:18   Ct Head Wo Contrast  Result Date: 12/10/2015 CLINICAL DATA:  Altered mental status.  Chronic renal failure EXAM: CT HEAD WITHOUT CONTRAST TECHNIQUE: Contiguous axial images were obtained from the base of the skull through the vertex without intravenous contrast. COMPARISON:  December 05, 2015 FINDINGS: Brain: The ventricles and sulci remain within normal limits for age. There is no intracranial mass, hemorrhage, extra-axial fluid collection, or midline shift. There is stable small vessel disease in the centra semiovale bilaterally, most notably anteriorly. Small vessel disease in the pons is also present and unchanged. There is no new gray-white compartment lesion evident. No acute infarct evident. Vascular: No hyperdense vessels are evident. Calcification in each carotid siphon and distal vertebral artery regions remains. Skull: Bony calvarium appears intact. Sinuses/Orbits: Visualized paranasal sinuses are clear. Orbits appear symmetric bilaterally. Orbits Other: Mastoid air cells are clear. IMPRESSION: Stable small vessel disease in supratentorial and infratentorial regions. No acute infarct evident. No mass, hemorrhage, or extra-axial fluid collection. There are multiple foci of arterial vascular calcification. Electronically Signed   By: Lowella Grip III M.D.   On: 12/10/2015 14:03   Dg Chest Port 1 View  Result Date: 12/10/2015 CLINICAL DATA:  Weakness and lethargy.  Mostly unresponsive. EXAM: PORTABLE CHEST 1 VIEW COMPARISON:  12/03/2015 FINDINGS: The cardio pericardial silhouette is enlarged. There is pulmonary vascular congestion without overt pulmonary  edema. Superimposed interstitial pulmonary edema likely. The visualized bony structures of the thorax are intact. Telemetry leads overlie the chest. IMPRESSION: Cardiomegaly with interstitial pulmonary edema. Electronically Signed   By: Misty Stanley M.D.   On: 12/10/2015 12:48   Dg Knee Complete 4 Views Right  Result Date: 12/10/2015 CLINICAL DATA:  History of right below-the-knee amputation. Recent fevers. Question osteomyelitis. EXAM: RIGHT KNEE - COMPLETE 4+ VIEW COMPARISON:  None. FINDINGS: Surgical staples are in place at the patient's below-the-knee amputation. No soft tissue gas collection is identified. No bony destructive change is seen. There is no fracture. Atherosclerosis is noted. IMPRESSION: Status post right BKA.  No acute abnormality. Electronically Signed   By: Inge Rise M.D.   On: 12/10/2015 16:44   Medications: . dextrose 30 mL/hr at 12/11/15 0302   . aspirin EC  81 mg Oral Daily  . atorvastatin  40 mg  Oral QHS  . calcium acetate  667 mg Oral TID WC  . feeding supplement  1 Container Oral TID BM  . gabapentin  200 mg Oral Q12H  . heparin  5,000 Units Subcutaneous Q8H  . labetalol  100 mg Oral TID  . levETIRAcetam  500 mg Oral BID  . losartan  100 mg Oral QPM  . saccharomyces boulardii  250 mg Oral Daily  . verapamil  120 mg Oral Q8H

## 2015-12-12 NOTE — Clinical Social Work Note (Signed)
Patient medically stable for discharge today and will return to [facility], transported by ambulance.  Discharge clinicals transmitted to facility and patient's friend Belinda Block [phone]) contacted regarding discharge.   Keath Matera [name], MSW, LCSW Licensed Clinical Social Worker [place] [phone]

## 2015-12-12 NOTE — Progress Notes (Signed)
EEG completed, results pending. 

## 2015-12-12 NOTE — Care Management Important Message (Signed)
Important Message  Patient Details  Name: Trafton Biggio MRN: [id number] Date of Birth: [birth year]   Medicare Important Message Given:  Yes    Merrie Epler 12/12/2015, 11:00 AM

## 2015-12-12 NOTE — Procedures (Signed)
ELECTROENCEPHALOGRAM REPORT  Date of Study: 12/12/2015  Patient's Name: [name] MRN: [id number] Date of Birth: 1955-09-19  Referring Provider: Dr. Lala Lund  Clinical History: This is a 60 year old man with altered mental status.  Medications: levETIRAcetam (KEPPRA) tablet 500 mg  acetaminophen (TYLENOL) tablet 650 mg  aspirin EC tablet 81 mg  atorvastatin (LIPITOR) tablet 40 mg  gabapentin (NEURONTIN) capsule 200 mg  labetalol (NORMODYNE) tablet 100 mg  losartan (COZAAR) tablet 100 mg  verapamil (CALAN) tablet 120 mg   Technical Summary: A multichannel digital EEG recording measured by the international 10-20 system with electrodes applied with paste and impedances below 5000 ohms performed as portable with EKG monitoring in a lethargic patient.  Hyperventilation and photic stimulation were hyperventilation performed.  The digital EEG was referentially recorded, reformatted, and digitally filtered in a variety of bipolar and referential montages for optimal display.   Description: The patient is reported as lethargic but arousable to follow commands during the recording. There is no clear posterior dominant rhythm. The background consists of a small amount of diffuse 4-6 Hz theta slowing. Normal sleep architecture is not seen. Hyperventilation and photic stimulation were not performed.  There were no epileptiform discharges or electrographic seizures seen.    EKG lead showed sinus bradycardia at 54 bpm.  Impression: This awake and drowsy EEG is abnormal due to mild diffuse slowing of the background.  Clinical Correlation of the above findings indicates diffuse cerebral dysfunction that is non-specific in etiology and can be seen with hypoxic/ischemic injury, toxic/metabolic encephalopathies, neurodegenerative disorders, or medication effect.  The absence of epileptiform discharges does not rule out a clinical diagnosis of epilepsy.  Clinical correlation is  advised.   Ellouise Newer, M.D.

## 2015-12-12 NOTE — Clinical Social Work Note (Addendum)
Clinical Social Work Assessment  Patient Details  Name: [name] MRN: [id number] Date of Birth: [birth year]  Date of referral:  12/12/15               Reason for consult:  Facility Placement (Patient from [facility])                Permission sought to share information with:  Facility Sport and exercise psychologist, Other Permission granted to share information::  Yes, Verbal Permission Granted  Name:: Belinda Block      Agency::  [place]  Relationship::  Friend  Contact Information:  [phone]  Housing/Transportation Living arrangements for the past 2 months:  [place] of Information:  Patient Patient Interpreter Needed:  None Criminal Activity/Legal Involvement Pertinent to Current Situation/Hospitalization:  No - Comment as needed Significant Relationships:  Friend Lives with:  Facility Resident Do you feel safe going back to the place where you live?  Yes Need for family participation in patient care:  No (Coment)  Care giving concerns:  Patient expressed no concerns regarding his care at facility.   Social Worker assessment / plan:  CSW visited room and spoke briefly with patient regarding his discharge plan. Mr. Yerby confirmed that he is from [facility] and plans to return at discharge. Patient advised that he will discharge today and requested that his friend Hilda Blades be contacted regarding discharge.  Employment status:  Disabled (Comment on whether or not currently receiving Disability) Insurance information:  Medicare, Medicaid In [place] PT Recommendations:  [place] / Referral to community resources:     Patient/Family's Response to care:  No concerns expressed regarding care during hospitalization.  Patient/Family's Understanding of and Emotional Response to Diagnosis, Current Treatment, and Prognosis:  Not discussed.  Emotional Assessment Appearance:  Appears older than stated  age Attitude/Demeanor/Rapport:  Other (Appropriate) Affect (typically observed):  Appropriate (Patient was asleep when CSW entered room but awakened when name called.) Orientation:  Oriented to Self, Oriented to Place, Oriented to Situation Alcohol / Substance use:  Tobacco Use, Alcohol Use, Illicit Drugs (Patient reported that he smokes, drinks approx. 14/4 ounces of alcohol per week and uses crack cocaine and marijuana occassionally) Psych involvement (Current and /or in the community):  No (Comment)  Discharge Needs  Concerns to be addressed:  Discharge Planning Concerns Readmission within the last 30 days:  Yes Current discharge risk:  None Barriers to Discharge:      Sable Feil, LCSW 12/12/2015, 1:42 PM

## 2015-12-12 NOTE — Progress Notes (Signed)
Report called to [employer] pt returning.

## 2015-12-12 NOTE — Discharge Instructions (Signed)
Follow with Primary MD  in 7 days   Get CBC, CMP, 2 view Chest X ray checked  by Primary MD or SNF MD in 5-7 days ( we routinely change or add medications that can affect your baseline labs and fluid status, therefore we recommend that you get the mentioned basic workup next visit with your PCP, your PCP may decide not to get them or add new tests based on their clinical decision)   Activity: As tolerated with Full fall precautions use walker/cane & assistance as needed   Disposition SNF   Diet:   Renal and 1.2 lit/day fluid restriction.   On your next visit with your primary care physician please Get Medicines reviewed and adjusted.   Please request your Prim.MD to go over all Hospital Tests and Procedure/Radiological results at the follow up, please get all Hospital records sent to your Prim MD by signing hospital release before you go home.   If you experience worsening of your admission symptoms, develop shortness of breath, life threatening emergency, suicidal or homicidal thoughts you must seek medical attention immediately by calling 911 or calling your MD immediately  if symptoms less severe.  You Must read complete instructions/literature along with all the possible adverse reactions/side effects for all the Medicines you take and that have been prescribed to you. Take any new Medicines after you have completely understood and accpet all the possible adverse reactions/side effects.   Do not drive, operate heavy machinery, perform activities at heights, swimming or participation in water activities or provide baby sitting services if your were admitted for syncope or siezures until you have seen by Primary MD or a Neurologist and advised to do so again.  Do not drive when taking Pain medications.    Do not take more than prescribed Pain, Sleep and Anxiety Medications  Special Instructions: If you have smoked or chewed Tobacco  in the last 2 yrs please stop smoking, stop any  regular Alcohol  and or any Recreational drug use.  Wear Seat belts while driving.   Please note  You were cared for by a hospitalist during your hospital stay. If you have any questions about your discharge medications or the care you received while you were in the hospital after you are discharged, you can call the unit and asked to speak with the hospitalist on call if the hospitalist that took care of you is not available. Once you are discharged, your primary care physician will handle any further medical issues. Please note that NO REFILLS for any discharge medications will be authorized once you are discharged, as it is imperative that you return to your primary care physician (or establish a relationship with a primary care physician if you do not have one) for your aftercare needs so that they can reassess your need for medications and monitor your lab values.

## 2015-12-12 NOTE — Discharge Summary (Signed)
[name] [id number] DOB: 04/01/1956 DOA: 12/10/2015  PCP: No PCP Per Patient  Admit date: 12/10/2015  Discharge date: 12/12/2015  Admitted From: SNF   Disposition:  SNF   Recommendations for Outpatient Follow-up:   Follow up with PCP in 1-2 weeks  PCP Please obtain BMP/CBC, 2 view CXR in 1week,  (see Discharge instructions)   PCP Please follow up on the following pending results: None   Home Health: None   Equipment/Devices: None  Consultations: Renal Discharge Condition: Stable   CODE STATUS: Full   Diet Recommendation: Renal with 1.2 L per day fluid restriction   Chief Complaint  Patient presents with  . Weakness  . Altered Mental Status     Brief history of present illness from the day of admission and additional interim summary    [name] a 60 y.o.malewith medical history significant for ESRD on hemodialysis Tuesday Thursday Saturday, last on 8/21, hypertension, seizure disorder, diastolic heart failure, chronic anemia on Aranesp Q Thursday with HD polysubstance abuse including cocaine and tobacco,recently history of sepsis secondary to necrotic right foot undergoing right BKA, discharged on 11/26/2015, recently hospitalized from 83 through 12/07/2015 due to GI bleed requiring transfusion of 4 units of blood, brought from his skilled nursing facility due to acute mental status changes. According to the nurse at the facility, the patient was in his usual state of health until this morning. He was apparently at his baseline behavioral health status. Of note, during prior to admission, the patient deet have an episode of acute encephalopathy, which was felt to be multifactorial, including recent illicit substance abuse, elevated blood pressure, and profound anemia from ABLA. Studies at the  time included EEG, showing diffuse cerebral dysfunction, negative CT, unremarkable ammonia B-12.   At the time of discharge, the patient was baseline alert and conversant. Today, he appears to have similar symptoms .Patient was unresponsive at the time of admission, and continues to be unable to engage in conversation. He does respond to verbal and tactile stimulation. Other ROS cannot be obtained at this time   Hospital issues addressed     1.Encephalopathy. Suspicious for polypharmacy however patient is anuric hence urine drug screen was not ordered in the ER,Did have mild hyperglycemia upon admission and does have history of seizures, CT head nonacute, no signs of seizures says he is compliant with Keppra, mentation is back to baseline, CBGs are stable, he has no focal deficits. Will be discharged home have cut down potentially sedative medications upon discharge have discontinued melatonin, Zoloft, minimize narcotic use as much as possible. Kindly monitor patient closely and make sure he is not getting any additional sedative medications by family members or friends.   2. HX of seizures. Continue Keppra for now. Recommend outpatient neurology follow-up with his primary neurologist within a week. If needed outpatient EEG.  3. Acute on chronic diastolic CHF in a patient with ESRD - was dialyzed upon admission, renal following, continue fluid removal with dialysis, on Tuesday, Thursday and Saturday schedule.  4. ESRD. As  a #3 above.  5. Recent right BKA. Site clean. Follow with his surgeon in 7-10 days for staple removal.  6. Essential hypertension. Continue home medications and monitor blood pressure at SNF.  7. Recent history of acute lower GI bleed. Stable will monitor. Continue PPI monitor H&H intermittently.  8. Dyslipidemia. Continue statin.   9. Mild hypoglycemia upon admission. Likely due to poor oral intake, stable TSH and random cortisol, stable ABGs with proper oral  intake.    Discharge diagnosis     Active Problems:   Hypertension   ESRD on hemodialysis (HCC)   Seizure disorder (HCC)   Cocaine abuse   Tobacco abuse   Prolonged Q-T interval on ECG   History of hepatitis B virus infection   Acute encephalopathy   Hepatitis C antibody test positive   HLD (hyperlipidemia)   ESRD on dialysis [facility])   Mental status change   Encephalopathy    Discharge instructions    Discharge Instructions    Discharge instructions    Complete by:  As directed   Follow with Primary MD  in 7 days   Get CBC, CMP, 2 view Chest X ray checked  by Primary MD or SNF MD in 5-7 days ( we routinely change or add medications that can affect your baseline labs and fluid status, therefore we recommend that you get the mentioned basic workup next visit with your PCP, your PCP may decide not to get them or add new tests based on their clinical decision)   Activity: As tolerated with Full fall precautions use walker/cane & assistance as needed   Disposition SNF   Diet:   Renal and 1.2 lit/day fluid restriction.   On your next visit with your primary care physician please Get Medicines reviewed and adjusted.   Please request your Prim.MD to go over all Hospital Tests and Procedure/Radiological results at the follow up, please get all Hospital records sent to your Prim MD by signing hospital release before you go home.   If you experience worsening of your admission symptoms, develop shortness of breath, life threatening emergency, suicidal or homicidal thoughts you must seek medical attention immediately by calling 911 or calling your MD immediately  if symptoms less severe.  You Must read complete instructions/literature along with all the possible adverse reactions/side effects for all the Medicines you take and that have been prescribed to you. Take any new Medicines after you have completely understood and accpet all the possible adverse reactions/side effects.     Do not drive, operate heavy machinery, perform activities at heights, swimming or participation in water activities or provide baby sitting services if your were admitted for syncope or siezures until you have seen by Primary MD or a Neurologist and advised to do so again.  Do not drive when taking Pain medications.    Do not take more than prescribed Pain, Sleep and Anxiety Medications  Special Instructions: If you have smoked or chewed Tobacco  in the last 2 yrs please stop smoking, stop any regular Alcohol  and or any Recreational drug use.  Wear Seat belts while driving.   Please note  You were cared for by a hospitalist during your hospital stay. If you have any questions about your discharge medications or the care you received while you were in the hospital after you are discharged, you can call the unit and asked to speak with the hospitalist on call if the hospitalist that took care of you is not available. Once  you are discharged, your primary care physician will handle any further medical issues. Please note that NO REFILLS for any discharge medications will be authorized once you are discharged, as it is imperative that you return to your primary care physician (or establish a relationship with a primary care physician if you do not have one) for your aftercare needs so that they can reassess your need for medications and monitor your lab values.   Increase activity slowly    Complete by:  As directed      Discharge Medications     Medication List    STOP taking these medications   Melatonin 3 MG Tabs   oxyCODONE-acetaminophen 5-325 MG tablet Commonly known as:  PERCOCET/ROXICET   sertraline 25 MG tablet Commonly known as:  ZOLOFT     TAKE these medications   amantadine 100 MG capsule Commonly known as:  SYMMETREL Take 100 mg by mouth 3 (three) times daily.   aspirin 81 MG EC tablet Take 1 tablet (81 mg total) by mouth daily.   atorvastatin 40 MG  tablet Commonly known as:  LIPITOR Take 1 tablet (40 mg total) by mouth at bedtime.   calcium acetate 667 MG capsule Commonly known as:  PHOSLO Take 667 mg by mouth 3 (three) times daily with meals.   docusate sodium 100 MG capsule Commonly known as:  COLACE Take 100 mg by mouth 2 (two) times daily.   gabapentin 100 MG capsule Commonly known as:  NEURONTIN Take 200 mg by mouth every 12 (twelve) hours.   GLUCAGON EMERGENCY 1 MG injection Generic drug:  glucagon Inject 1 mg into the vein once as needed.   hydrALAZINE 100 MG tablet Commonly known as:  APRESOLINE Take 1 tablet (100 mg total) by mouth every 8 (eight) hours.   HYDROcodone-acetaminophen 5-325 MG tablet Commonly known as:  NORCO/VICODIN Take 1 tablet by mouth every 6 (six) hours as needed (for pain).   labetalol 100 MG tablet Commonly known as:  NORMODYNE Take 1 tablet (100 mg total) by mouth 3 (three) times daily.   levETIRAcetam 500 MG tablet Commonly known as:  KEPPRA Take 500 mg by mouth 2 (two) times daily.   losartan 100 MG tablet Commonly known as:  COZAAR Take 100 mg by mouth at bedtime.   mirtazapine 7.5 MG tablet Commonly known as:  REMERON Take 7.5 mg by mouth at bedtime.   ondansetron 4 MG tablet Commonly known as:  ZOFRAN Take 4 mg by mouth every 8 (eight) hours as needed for nausea or vomiting.   polyethylene glycol packet Commonly known as:  MIRALAX / GLYCOLAX Take 17 g by mouth daily.   RENA-VITE PO Take 1 tablet by mouth daily.   saccharomyces boulardii 250 MG capsule Commonly known as:  FLORASTOR Take 250 mg by mouth daily.   senna-docusate 8.6-50 MG tablet Commonly known as:  Senokot-S Take 1 tablet by mouth 2 (two) times daily. For constipation   verapamil 120 MG tablet Commonly known as:  CALAN Take 1 tablet (120 mg total) by mouth every 8 (eight) hours. What changed:  when to take this       Follow-up Information    PCP. Schedule an appointment as soon as possible  for a visit in 1 week(s).           Major procedures and Radiology Reports - PLEASE review detailed and final reports thoroughly  -        X-ray Chest Pa And Lateral  Result Date:  12/11/2015 CLINICAL DATA:  Interstitial edema EXAM: CHEST  2 VIEW COMPARISON:  Portable chest x-ray of December 10, 2015 FINDINGS: The lungs are adequately inflated. There are coarse lung markings at both bases. These are more conspicuous on the right but are stable on the left. The left hemidiaphragm is slightly better demonstrated today. There are pleural effusions layering posteriorly, bilaterally. The cardiac silhouette remains enlarged. The central pulmonary vascularity is prominent. There is calcification in the wall of the aortic arch. IMPRESSION: CHF with mild interstitial edema and bilateral pleural effusions. Bibasilar atelectasis greatest on the left. There has not been significant interval change since yesterday's study. Electronically Signed   By: David  Martinique M.D.   On: 12/11/2015 07:18   Ct Head Wo Contrast  Result Date: 12/10/2015 CLINICAL DATA:  Altered mental status.  Chronic renal failure EXAM: CT HEAD WITHOUT CONTRAST TECHNIQUE: Contiguous axial images were obtained from the base of the skull through the vertex without intravenous contrast. COMPARISON:  December 05, 2015 FINDINGS: Brain: The ventricles and sulci remain within normal limits for age. There is no intracranial mass, hemorrhage, extra-axial fluid collection, or midline shift. There is stable small vessel disease in the centra semiovale bilaterally, most notably anteriorly. Small vessel disease in the pons is also present and unchanged. There is no new gray-white compartment lesion evident. No acute infarct evident. Vascular: No hyperdense vessels are evident. Calcification in each carotid siphon and distal vertebral artery regions remains. Skull: Bony calvarium appears intact. Sinuses/Orbits: Visualized paranasal sinuses are clear. Orbits  appear symmetric bilaterally. Orbits Other: Mastoid air cells are clear. IMPRESSION: Stable small vessel disease in supratentorial and infratentorial regions. No acute infarct evident. No mass, hemorrhage, or extra-axial fluid collection. There are multiple foci of arterial vascular calcification. Electronically Signed   By: Lowella Grip III M.D.   On: 12/10/2015 14:03   Ct Head Wo Contrast  Result Date: 12/05/2015 CLINICAL DATA:  Altered mental status.  Chronic renal failure EXAM: CT HEAD WITHOUT CONTRAST TECHNIQUE: Contiguous axial images were obtained from the base of the skull through the vertex without intravenous contrast. COMPARISON:  February 01, 2014 FINDINGS: Brain: Ventricles and sulci are within normal limits for age. There is no intracranial mass, hemorrhage, extra-axial fluid collection, or midline shift. There is patchy small vessel disease in the centra semiovale bilaterally, most notably in the anterior centra semiovale bilaterally, stable. Small vessel disease is also noted in the mid pons in the basilar perforator distribution. Elsewhere gray-white compartments appear normal. No acute infarct evident. Vascular: There is no hyperdense vessel. There are foci of calcification in each carotid siphon and distal vertebral artery region. Skull: The bony calvarium appears intact. Sinuses/Orbits: Orbits appear symmetric bilaterally. Visualized paranasal sinuses are clear. Other: Mastoid air cells are clear. IMPRESSION: Stable supratentorial infratentorial small vessel disease. No acute infarct evident. No hemorrhage or mass effect. Foci of arterial vascular calcification noted. Electronically Signed   By: Lowella Grip III M.D.   On: 12/05/2015 09:06   Dg Chest Port 1 View  Result Date: 12/10/2015 CLINICAL DATA:  Weakness and lethargy.  Mostly unresponsive. EXAM: PORTABLE CHEST 1 VIEW COMPARISON:  12/03/2015 FINDINGS: The cardio pericardial silhouette is enlarged. There is pulmonary  vascular congestion without overt pulmonary edema. Superimposed interstitial pulmonary edema likely. The visualized bony structures of the thorax are intact. Telemetry leads overlie the chest. IMPRESSION: Cardiomegaly with interstitial pulmonary edema. Electronically Signed   By: Misty Stanley M.D.   On: 12/10/2015 12:48   Dg Chest Port 1  View  Result Date: 12/03/2015 CLINICAL DATA:  Fever and fatigue. EXAM: PORTABLE CHEST 1 VIEW COMPARISON:  08/16/2015 FINDINGS: 1514 hours. Lungs are hyperexpanded. The lungs are clear wiithout focal pneumonia, edema, pneumothorax or pleural effusion. The cardio pericardial silhouette is enlarged. The visualized bony structures of the thorax are intact. Degenerative changes are seen in the glenohumeral joints bilaterally. IMPRESSION: No active disease. Electronically Signed   By: Misty Stanley M.D.   On: 12/03/2015 15:28   Dg Knee Complete 4 Views Right  Result Date: 12/10/2015 CLINICAL DATA:  History of right below-the-knee amputation. Recent fevers. Question osteomyelitis. EXAM: RIGHT KNEE - COMPLETE 4+ VIEW COMPARISON:  None. FINDINGS: Surgical staples are in place at the patient's below-the-knee amputation. No soft tissue gas collection is identified. No bony destructive change is seen. There is no fracture. Atherosclerosis is noted. IMPRESSION: Status post right BKA.  No acute abnormality. Electronically Signed   By: Inge Rise M.D.   On: 12/10/2015 16:44    Micro Results     Recent Results (from the past 240 hour(s))  Blood Culture (routine x 2)     Status: None   Collection Time: 12/03/15  2:50 PM  Result Value Ref Range Status   Specimen Description BLOOD BLOOD RIGHT FOREARM  Final   Special Requests BOTTLES DRAWN AEROBIC AND ANAEROBIC 5ML  Final   Culture NO GROWTH 5 DAYS  Final   Report Status 12/08/2015 FINAL  Final  Blood Culture (routine x 2)     Status: None   Collection Time: 12/03/15  3:18 PM  Result Value Ref Range Status   Specimen  Description BLOOD LEFT HAND  Final   Special Requests BOTTLES DRAWN AEROBIC AND ANAEROBIC 5CC  Final   Culture NO GROWTH 5 DAYS  Final   Report Status 12/08/2015 FINAL  Final  MRSA PCR Screening     Status: None   Collection Time: 12/04/15 12:52 AM  Result Value Ref Range Status   MRSA by PCR NEGATIVE NEGATIVE Final    Comment:        The GeneXpert MRSA Assay (FDA approved for NASAL specimens only), is one component of a comprehensive MRSA colonization surveillance program. It is not intended to diagnose MRSA infection nor to guide or monitor treatment for MRSA infections.   Culture, blood (Routine X 2) w Reflex to ID Panel     Status: None (Preliminary result)   Collection Time: 12/10/15  1:00 PM  Result Value Ref Range Status   Specimen Description BLOOD RIGHT ARM  Final   Special Requests BOTTLES DRAWN AEROBIC AND ANAEROBIC 5CC  Final   Culture NO GROWTH < 24 HOURS  Final   Report Status PENDING  Incomplete  Culture, blood (Routine X 2) w Reflex to ID Panel     Status: None (Preliminary result)   Collection Time: 12/10/15  1:15 PM  Result Value Ref Range Status   Specimen Description BLOOD RIGHT HAND  Final   Special Requests BOTTLES DRAWN AEROBIC ONLY 5CC  Final   Culture NO GROWTH < 24 HOURS  Final   Report Status PENDING  Incomplete    Today   Subjective    [name] today has no headache,no chest abdominal pain,no new weakness tingling or numbness, feels much better     Objective   Blood pressure (!) 152/64, pulse 68, temperature 98.5 F (36.9 C), temperature source Oral, resp. rate 16, height 5\' 8"  (1.727 m), weight 57.6 kg (127 lb 1.5 oz), SpO2 100 %.  Intake/Output Summary (Last 24 hours) at 12/12/15 0912 Last data filed at 12/12/15 0556  Gross per 24 hour  Intake              394 ml  Output              571 ml  Net             -177 ml    Exam Awake Alert, Oriented x 3, No new F.N deficits, Normal affect [place].AT,PERRAL Supple Neck,No JVD, No  cervical lymphadenopathy appriciated.  Symmetrical Chest wall movement, Good air movement bilaterally, CTAB RRR,No Gallops,Rubs or new Murmurs, No Parasternal Heave +ve B.Sounds, Abd Soft, Non tender, No organomegaly appriciated, No rebound -guarding or rigidity. No Cyanosis, Clubbing or edema, No new Rash or bruise, Right BKA stump clean   Data Review   CBC w Diff: Lab Results  Component Value Date   WBC 3.9 (L) 12/11/2015   HGB 10.1 (L) 12/11/2015   HGB 14.1 11/09/2013   HCT 32.5 (L) 12/11/2015   HCT 45.2 11/09/2013   PLT 165 12/11/2015   PLT 246 11/09/2013   LYMPHOPCT 21 12/10/2015   LYMPHOPCT 25.2 11/09/2013   MONOPCT 14 12/10/2015   MONOPCT 12.4 11/09/2013   EOSPCT 6 12/10/2015   EOSPCT 5.1 11/09/2013   BASOPCT 1 12/10/2015   BASOPCT 3.3 (H) 11/09/2013    CMP: Lab Results  Component Value Date   NA 134 (L) 12/11/2015   NA 141 11/09/2013   K 3.4 (L) 12/11/2015   K 4.6 11/09/2013   CL 97 (L) 12/11/2015   CO2 32 12/11/2015   CO2 32 (H) 11/09/2013   BUN 10 12/11/2015   BUN 28.5 (H) 11/09/2013   CREATININE 4.64 (H) 12/11/2015   CREATININE 6.6 (HH) 11/09/2013   PROT 5.7 (L) 12/11/2015   PROT 9.2 (H) 11/09/2013   ALBUMIN 2.0 (L) 12/11/2015   ALBUMIN 3.5 11/09/2013   BILITOT 0.6 12/11/2015   BILITOT 0.59 11/09/2013   ALKPHOS 64 12/11/2015   ALKPHOS 74 11/09/2013   AST 15 12/11/2015   AST 33 11/09/2013   ALT 6 (L) 12/11/2015   ALT 22 11/09/2013  .   Total Time in preparing paper work, data evaluation and todays exam - 35 minutes  Thurnell Lose M.D on 12/12/2015 at [place]  [phone]

## 2015-12-12 NOTE — NC FL2 (Signed)
[place] LEVEL OF CARE SCREENING TOOL     IDENTIFICATION  Patient Name: [name] Birthdate: 08-21-1955 Sex: male Admission Date (Current Location): 12/10/2015  [place] and [place] Number:  [place] [id number] Facility and Address:  [facility], [employer] [address] [place], [place] [address]      Provider Number: [id number]  Attending Physician Name and Address:  Thurnell Lose, MD  Relative Name and Phone Number:  Belinda Block - friend.  [phone]    Current Level of Care: Hospital Recommended Level of Care: [facility] Prior Approval Number:    Date Approved/Denied:   PASRR Number: [id number]  ((11/24/15 - 01/23/16))  Discharge Plan: SNF    Current Diagnoses: Patient Active Problem List   Diagnosis Date Noted  . Encephalopathy 12/10/2015  . Mental status change   . GI bleed 12/03/2015  . Lower GI bleed 12/03/2015  . Anemia in chronic kidney disease 09/17/2015  . Polysubstance abuse 09/17/2015  . HLD (hyperlipidemia) 09/17/2015  . Acute hyperkalemia   . ESRD on dialysis ([place])   . Physical deconditioning   . Constipation   . EKG, abnormal   . Alcoholism /alcohol abuse ([place])   . NSTEMI (non-ST elevated myocardial infarction) ([place]) 08/16/2015  . Hypertensive emergency 08/16/2015  . Hepatitis C antibody test positive 06/23/2015  . History of hepatitis B virus infection 06/20/2015  . Acute encephalopathy 06/20/2015  . Acute uremia   . Hyperkalemia   . Alcohol withdrawal ([place]) 06/19/2015  . Acute GI bleeding 04/12/2015  . LVH (left ventricular hypertrophy) due to hypertensive disease 04/12/2015  . Prolonged Q-T interval on ECG 04/12/2015  . Bleeding gastrointestinal   . Anemia of chronic kidney failure 04/26/2014  . Hyperparathyroidism, secondary renal ([place]) 04/26/2014  . Hematochezia   . Protein-calorie malnutrition, severe ([place]) 01/25/2014  . Optic neuritis, left 01/19/2014  . Acute blood loss anemia  08/21/2013  . Cocaine abuse 03/02/2012  . Tobacco abuse 03/02/2012  . Hypertension   . ESRD on hemodialysis ([place])   . Seizure disorder ([place])     Orientation RESPIRATION BLADDER Height & Weight     Self, Place, Situation  Normal Continent Weight: 127 lb 1.5 oz (57.6 kg) Height:  5\' 8"  (172.7 cm)  BEHAVIORAL SYMPTOMS/MOOD NEUROLOGICAL BOWEL NUTRITION STATUS      Continent Diet (Renal with 1200 mL fluid restriction)  AMBULATORY STATUS COMMUNICATION OF NEEDS Skin   Total Care (Patient unable to ambulate per PT. Has right BKA) Verbally Skin abrasions (Left knee abrasion. )                       Personal Care Assistance Level of Assistance  Bathing, Feeding, Dressing Bathing Assistance: Limited assistance Feeding assistance: Independent Dressing Assistance: Limited assistance     Functional Limitations Info  Sight, Hearing, Speech Sight Info: Adequate Hearing Info: Adequate Speech Info: Impaired (Slurred speech, dysarthria)    SPECIAL CARE FACTORS FREQUENCY  PT (By licensed PT)     PT Frequency: Evaluation 8/24 with a minimum of 2X per week recommended              Contractures Contractures Info: Not present    Additional Factors Info  Code Status, Allergies Code Status Info: Full Allergies Info: Reglan           Current Medications (12/12/2015):  This is the current hospital active medication list Current Facility-Administered Medications  Medication Dose Route Frequency Provider Last Rate Last Dose  .  acetaminophen (TYLENOL) tablet 650 mg  650 mg Oral Q6H PRN Rondel Jumbo, PA-C   650 mg at 12/10/15 2235   Or  . acetaminophen (TYLENOL) suppository 650 mg  650 mg Rectal Q6H PRN Rondel Jumbo, PA-C      . aspirin EC tablet 81 mg  81 mg Oral Daily Rondel Jumbo, PA-C   81 mg at 12/12/15 1035  . atorvastatin (LIPITOR) tablet 40 mg  40 mg Oral QHS Rondel Jumbo, PA-C   40 mg at 12/11/15 2225  . bisacodyl (DULCOLAX) suppository 10 mg  10 mg Rectal Daily PRN  Rondel Jumbo, PA-C      . calcium acetate (PHOSLO) capsule 667 mg  667 mg Oral TID WC Rondel Jumbo, PA-C   667 mg at 12/12/15 1322  . dextrose 10 % infusion   Intravenous Continuous Gardiner Barefoot, NP 30 mL/hr at 12/11/15 0302    . feeding supplement (BOOST / RESOURCE BREEZE) liquid 1 Container  1 Container Oral TID BM Thurnell Lose, MD      . gabapentin (NEURONTIN) capsule 200 mg  200 mg Oral Q12H Rondel Jumbo, PA-C   200 mg at 12/12/15 1035  . heparin injection 5,000 Units  5,000 Units Subcutaneous Q8H Rondel Jumbo, PA-C   5,000 Units at 12/12/15 1322  . hydrALAZINE (APRESOLINE) injection 10 mg  10 mg Intravenous Q6H PRN Thurnell Lose, MD      . labetalol (NORMODYNE) tablet 100 mg  100 mg Oral TID Rondel Jumbo, PA-C   100 mg at 12/12/15 1036  . levETIRAcetam (KEPPRA) tablet 500 mg  500 mg Oral BID Rondel Jumbo, PA-C   500 mg at 12/12/15 1035  . losartan (COZAAR) tablet 100 mg  100 mg Oral QPM Rondel Jumbo, PA-C   100 mg at 12/11/15 0028  . magnesium citrate solution 1 Bottle  1 Bottle Oral Once PRN Rondel Jumbo, PA-C      . saccharomyces boulardii (FLORASTOR) capsule 250 mg  250 mg Oral Daily Rondel Jumbo, PA-C   250 mg at 12/12/15 1036  . senna-docusate (Senokot-S) tablet 1 tablet  1 tablet Oral QHS PRN Rondel Jumbo, PA-C      . sodium chloride flush (NS) 0.9 % injection 10-40 mL  10-40 mL Intracatheter PRN Waldemar Dickens, MD      . verapamil (CALAN) tablet 120 mg  120 mg Oral Q8H Coralee Pesa [place], PA-C   120 mg at 12/12/15 0602     Discharge Medications: Please see discharge summary for a list of discharge medications.  Relevant Imaging Results:  Relevant Lab Results:   Additional Information ss [id number].  Dialysis patient TTS   Sharlet Salina Mila Homer, LCSW

## 2015-12-16 LAB — OPIATES,MS,WB/SP RFX
6-ACETYLMORPHINE: NEGATIVE
Codeine: NEGATIVE ng/mL
DIHYDROCODEINE: NEGATIVE ng/mL
HYDROCODONE: 2.1 ng/mL
Hydromorphone: NEGATIVE ng/mL
Morphine: NEGATIVE ng/mL
OPIATE CONFIRMATION: POSITIVE

## 2015-12-16 LAB — DRUG SCREEN 10 W/CONF, SERUM
AMPHETAMINES, IA: NEGATIVE ng/mL
Barbiturates, IA: NEGATIVE ug/mL
Benzodiazepines, IA: NEGATIVE ng/mL
Cocaine & Metabolite, IA: NEGATIVE ng/mL
METHADONE, IA: NEGATIVE ng/mL
OPIATES, IA: POSITIVE ng/mL
OXYCODONES, IA: NEGATIVE ng/mL
Phencyclidine, IA: NEGATIVE ng/mL
Propoxyphene, IA: NEGATIVE ng/mL
THC(MARIJUANA) METABOLITE, IA: NEGATIVE ng/mL

## 2015-12-16 LAB — OXYCODONES,MS,WB/SP RFX
Oxycocone: NEGATIVE ng/mL
Oxycodones Confirmation: NEGATIVE
Oxymorphone: NEGATIVE ng/mL

## 2015-12-17 LAB — CULTURE, BLOOD (ROUTINE X 2)
Culture: NO GROWTH
Culture: NO GROWTH

## 2015-12-19 LAB — OPIATES,MS,WB/SP RFX
6-Acetylmorphine: NEGATIVE
CODEINE: NEGATIVE ng/mL
DIHYDROCODEINE: NEGATIVE ng/mL
Hydrocodone: NEGATIVE ng/mL
Hydromorphone: NEGATIVE ng/mL
MORPHINE: NEGATIVE ng/mL
OPIATE CONFIRMATION: NEGATIVE

## 2015-12-19 LAB — DRUG SCREEN 10 W/CONF, SERUM
Amphetamines, IA: NEGATIVE ng/mL
BARBITURATES, IA: NEGATIVE ug/mL
BENZODIAZEPINES, IA: NEGATIVE ng/mL
COCAINE & METABOLITE, IA: NEGATIVE ng/mL
Methadone, IA: NEGATIVE ng/mL
OPIATES, IA: NEGATIVE ng/mL
Oxycodones, IA: NEGATIVE ng/mL
Phencyclidine, IA: NEGATIVE ng/mL
Propoxyphene, IA: NEGATIVE ng/mL
THC(MARIJUANA) METABOLITE, IA: NEGATIVE ng/mL

## 2015-12-19 LAB — OXYCODONES,MS,WB/SP RFX
OXYMORPHONE: NEGATIVE ng/mL
Oxycocone: NEGATIVE ng/mL
Oxycodones Confirmation: NEGATIVE

## 2015-12-23 ENCOUNTER — Emergency Department (HOSPITAL_COMMUNITY): Payer: Medicare Other

## 2015-12-23 ENCOUNTER — Inpatient Hospital Stay (HOSPITAL_COMMUNITY)
Admission: EM | Admit: 2015-12-23 | Discharge: 2016-01-01 | DRG: 070 | Disposition: A | Discharge status: Skilled Nursing Facility | Payer: Medicare Other | Attending: Internal Medicine | Admitting: Internal Medicine

## 2015-12-23 ENCOUNTER — Encounter (HOSPITAL_COMMUNITY): Payer: Self-pay

## 2015-12-23 ENCOUNTER — Inpatient Hospital Stay (HOSPITAL_COMMUNITY): Payer: Medicare Other

## 2015-12-23 DIAGNOSIS — Z992 Dependence on renal dialysis: Secondary | ICD-10-CM | POA: Diagnosis not present

## 2015-12-23 DIAGNOSIS — R8290 Unspecified abnormal findings in urine: Secondary | ICD-10-CM | POA: Diagnosis not present

## 2015-12-23 DIAGNOSIS — Z7982 Long term (current) use of aspirin: Secondary | ICD-10-CM

## 2015-12-23 DIAGNOSIS — I252 Old myocardial infarction: Secondary | ICD-10-CM

## 2015-12-23 DIAGNOSIS — Z79899 Other long term (current) drug therapy: Secondary | ICD-10-CM | POA: Diagnosis not present

## 2015-12-23 DIAGNOSIS — F17210 Nicotine dependence, cigarettes, uncomplicated: Secondary | ICD-10-CM | POA: Diagnosis present

## 2015-12-23 DIAGNOSIS — I4581 Long QT syndrome: Secondary | ICD-10-CM

## 2015-12-23 DIAGNOSIS — R400 Somnolence: Secondary | ICD-10-CM | POA: Diagnosis not present

## 2015-12-23 DIAGNOSIS — F1910 Other psychoactive substance abuse, uncomplicated: Secondary | ICD-10-CM | POA: Diagnosis present

## 2015-12-23 DIAGNOSIS — Z9119 Patient's noncompliance with other medical treatment and regimen: Secondary | ICD-10-CM

## 2015-12-23 DIAGNOSIS — Z681 Body mass index (BMI) 19 or less, adult: Secondary | ICD-10-CM

## 2015-12-23 DIAGNOSIS — R9431 Abnormal electrocardiogram [ECG] [EKG]: Secondary | ICD-10-CM | POA: Diagnosis present

## 2015-12-23 DIAGNOSIS — D631 Anemia in chronic kidney disease: Secondary | ICD-10-CM | POA: Diagnosis present

## 2015-12-23 DIAGNOSIS — N2581 Secondary hyperparathyroidism of renal origin: Secondary | ICD-10-CM | POA: Diagnosis present

## 2015-12-23 DIAGNOSIS — E860 Dehydration: Secondary | ICD-10-CM | POA: Diagnosis present

## 2015-12-23 DIAGNOSIS — G9340 Encephalopathy, unspecified: Secondary | ICD-10-CM | POA: Diagnosis present

## 2015-12-23 DIAGNOSIS — Z96652 Presence of left artificial knee joint: Secondary | ICD-10-CM | POA: Diagnosis present

## 2015-12-23 DIAGNOSIS — N185 Chronic kidney disease, stage 5: Secondary | ICD-10-CM | POA: Diagnosis not present

## 2015-12-23 DIAGNOSIS — E43 Unspecified severe protein-calorie malnutrition: Secondary | ICD-10-CM | POA: Diagnosis present

## 2015-12-23 DIAGNOSIS — I10 Essential (primary) hypertension: Secondary | ICD-10-CM

## 2015-12-23 DIAGNOSIS — R4182 Altered mental status, unspecified: Secondary | ICD-10-CM | POA: Diagnosis present

## 2015-12-23 DIAGNOSIS — Z89511 Acquired absence of right leg below knee: Secondary | ICD-10-CM | POA: Diagnosis not present

## 2015-12-23 DIAGNOSIS — E8889 Other specified metabolic disorders: Secondary | ICD-10-CM | POA: Diagnosis present

## 2015-12-23 DIAGNOSIS — N186 End stage renal disease: Secondary | ICD-10-CM | POA: Diagnosis not present

## 2015-12-23 DIAGNOSIS — I132 Hypertensive heart and chronic kidney disease with heart failure and with stage 5 chronic kidney disease, or end stage renal disease: Secondary | ICD-10-CM | POA: Diagnosis present

## 2015-12-23 DIAGNOSIS — T426X5A Adverse effect of other antiepileptic and sedative-hypnotic drugs, initial encounter: Secondary | ICD-10-CM | POA: Diagnosis present

## 2015-12-23 DIAGNOSIS — E785 Hyperlipidemia, unspecified: Secondary | ICD-10-CM | POA: Diagnosis present

## 2015-12-23 DIAGNOSIS — N189 Chronic kidney disease, unspecified: Secondary | ICD-10-CM

## 2015-12-23 DIAGNOSIS — G40909 Epilepsy, unspecified, not intractable, without status epilepticus: Secondary | ICD-10-CM | POA: Diagnosis present

## 2015-12-23 DIAGNOSIS — I5032 Chronic diastolic (congestive) heart failure: Secondary | ICD-10-CM | POA: Diagnosis present

## 2015-12-23 DIAGNOSIS — Z8619 Personal history of other infectious and parasitic diseases: Secondary | ICD-10-CM

## 2015-12-23 LAB — RAPID URINE DRUG SCREEN, HOSP PERFORMED
AMPHETAMINES: NOT DETECTED
Barbiturates: NOT DETECTED
Benzodiazepines: NOT DETECTED
Cocaine: NOT DETECTED
Opiates: NOT DETECTED
TETRAHYDROCANNABINOL: NOT DETECTED

## 2015-12-23 LAB — COMPREHENSIVE METABOLIC PANEL WITH GFR
ALT: 14 U/L — ABNORMAL LOW (ref 17–63)
AST: 26 U/L (ref 15–41)
Albumin: 3.2 g/dL — ABNORMAL LOW (ref 3.5–5.0)
Alkaline Phosphatase: 70 U/L (ref 38–126)
Anion gap: 13 (ref 5–15)
BUN: 31 mg/dL — ABNORMAL HIGH (ref 6–20)
CO2: 30 mmol/L (ref 22–32)
Calcium: 11.9 mg/dL — ABNORMAL HIGH (ref 8.9–10.3)
Chloride: 96 mmol/L — ABNORMAL LOW (ref 101–111)
Creatinine, Ser: 7.13 mg/dL — ABNORMAL HIGH (ref 0.61–1.24)
GFR calc Af Amer: 9 mL/min — ABNORMAL LOW
GFR calc non Af Amer: 8 mL/min — ABNORMAL LOW
Glucose, Bld: 84 mg/dL (ref 65–99)
Potassium: 4.2 mmol/L (ref 3.5–5.1)
Sodium: 139 mmol/L (ref 135–145)
Total Bilirubin: 0.8 mg/dL (ref 0.3–1.2)
Total Protein: 7.4 g/dL (ref 6.5–8.1)

## 2015-12-23 LAB — URINALYSIS, ROUTINE W REFLEX MICROSCOPIC
Bilirubin Urine: NEGATIVE
GLUCOSE, UA: 250 mg/dL — AB
HGB URINE DIPSTICK: NEGATIVE
KETONES UR: NEGATIVE mg/dL
Leukocytes, UA: NEGATIVE
Nitrite: NEGATIVE
Specific Gravity, Urine: 1.019 (ref 1.005–1.030)
pH: 8.5 — ABNORMAL HIGH (ref 5.0–8.0)

## 2015-12-23 LAB — CBC WITH DIFFERENTIAL/PLATELET
Basophils Absolute: 0 K/uL (ref 0.0–0.1)
Basophils Relative: 1 %
Eosinophils Absolute: 0.3 K/uL (ref 0.0–0.7)
Eosinophils Relative: 6 %
HCT: 41.2 % (ref 39.0–52.0)
Hemoglobin: 12.4 g/dL — ABNORMAL LOW (ref 13.0–17.0)
Lymphocytes Relative: 12 %
Lymphs Abs: 0.7 K/uL (ref 0.7–4.0)
MCH: 28.2 pg (ref 26.0–34.0)
MCHC: 30.1 g/dL (ref 30.0–36.0)
MCV: 93.8 fL (ref 78.0–100.0)
Monocytes Absolute: 0.5 K/uL (ref 0.1–1.0)
Monocytes Relative: 8 %
Neutro Abs: 4 K/uL (ref 1.7–7.7)
Neutrophils Relative %: 73 %
Platelets: 190 K/uL (ref 150–400)
RBC: 4.39 MIL/uL (ref 4.22–5.81)
RDW: 16.3 % — ABNORMAL HIGH (ref 11.5–15.5)
WBC: 5.5 K/uL (ref 4.0–10.5)

## 2015-12-23 LAB — CBG MONITORING, ED: Glucose-Capillary: 81 mg/dL (ref 65–99)

## 2015-12-23 LAB — URINE MICROSCOPIC-ADD ON: RBC / HPF: NONE SEEN RBC/hpf (ref 0–5)

## 2015-12-23 LAB — I-STAT CG4 LACTIC ACID, ED
LACTIC ACID, VENOUS: 0.66 mmol/L (ref 0.5–1.9)
Lactic Acid, Venous: 0.5 mmol/L (ref 0.5–1.9)

## 2015-12-23 MED ORDER — NALOXONE HCL 0.4 MG/ML IJ SOLN
0.4000 mg | INTRAMUSCULAR | Status: DC | PRN
  Administered 2015-12-23: 0.4 mg via INTRAVENOUS

## 2015-12-23 MED ORDER — PENTAFLUOROPROP-TETRAFLUOROETH EX AERO
1.0000 "application " | INHALATION_SPRAY | CUTANEOUS | Status: DC | PRN
  Filled 2015-12-23: qty 30

## 2015-12-23 MED ORDER — SODIUM CHLORIDE 0.9% FLUSH
3.0000 mL | Freq: Two times a day (BID) | INTRAVENOUS | Status: DC
  Administered 2015-12-23 – 2015-12-31 (×9): 3 mL via INTRAVENOUS

## 2015-12-23 MED ORDER — LIDOCAINE-PRILOCAINE 2.5-2.5 % EX CREA
1.0000 "application " | TOPICAL_CREAM | CUTANEOUS | Status: DC | PRN
  Filled 2015-12-23: qty 5

## 2015-12-23 MED ORDER — NALOXONE HCL 0.4 MG/ML IJ SOLN
INTRAMUSCULAR | Status: AC
  Filled 2015-12-23: qty 1

## 2015-12-23 MED ORDER — LORAZEPAM 2 MG/ML IJ SOLN: 1.0000 mg | Freq: Once | INTRAMUSCULAR | Status: DC

## 2015-12-23 MED ORDER — HEPARIN SODIUM (PORCINE) 5000 UNIT/ML IJ SOLN
5000.0000 [IU] | Freq: Three times a day (TID) | INTRAMUSCULAR | Status: DC
  Administered 2015-12-23 – 2016-01-01 (×22): 5000 [IU] via SUBCUTANEOUS
  Filled 2015-12-23 (×24): qty 1

## 2015-12-23 MED ORDER — HYDRALAZINE HCL 20 MG/ML IJ SOLN
10.0000 mg | Freq: Four times a day (QID) | INTRAMUSCULAR | Status: DC | PRN
  Administered 2015-12-23 – 2015-12-31 (×6): 10 mg via INTRAVENOUS
  Filled 2015-12-23 (×7): qty 1

## 2015-12-23 MED ORDER — LIDOCAINE HCL (PF) 1 % IJ SOLN: 5.0000 mL | INTRAMUSCULAR | Status: DC | PRN

## 2015-12-23 MED ORDER — VANCOMYCIN HCL IN DEXTROSE 1-5 GM/200ML-% IV SOLN: 1000.0000 mg | Freq: Once | INTRAVENOUS | Status: DC

## 2015-12-23 MED ORDER — VANCOMYCIN HCL 500 MG IV SOLR
500.0000 mg | INTRAVENOUS | Status: DC
  Administered 2015-12-23: 500 mg via INTRAVENOUS
  Filled 2015-12-23: qty 500

## 2015-12-23 MED ORDER — ALTEPLASE 2 MG IJ SOLR: 2.0000 mg | Freq: Once | INTRAMUSCULAR | Status: DC | PRN

## 2015-12-23 MED ORDER — PIPERACILLIN-TAZOBACTAM 3.375 G IVPB 30 MIN
3.3750 g | Freq: Once | INTRAVENOUS | Status: AC
  Administered 2015-12-23: 3.375 g via INTRAVENOUS
  Filled 2015-12-23: qty 50

## 2015-12-23 MED ORDER — LEVETIRACETAM 500 MG/5ML IV SOLN
500.0000 mg | Freq: Two times a day (BID) | INTRAVENOUS | Status: DC
  Administered 2015-12-23 – 2015-12-27 (×8): 500 mg via INTRAVENOUS
  Filled 2015-12-23 (×11): qty 5

## 2015-12-23 MED ORDER — VANCOMYCIN HCL 10 G IV SOLR
1250.0000 mg | INTRAVENOUS | Status: AC
  Administered 2015-12-23: 1250 mg via INTRAVENOUS
  Filled 2015-12-23: qty 1250

## 2015-12-23 MED ORDER — SODIUM CHLORIDE 0.9 % IV SOLN: 100.0000 mL | INTRAVENOUS | Status: DC | PRN

## 2015-12-23 MED ORDER — HEPARIN SODIUM (PORCINE) 1000 UNIT/ML DIALYSIS
1000.0000 [IU] | INTRAMUSCULAR | Status: DC | PRN
  Filled 2015-12-23: qty 1

## 2015-12-23 MED ORDER — LORAZEPAM 2 MG/ML IJ SOLN
1.0000 mg | Freq: Once | INTRAMUSCULAR | Status: AC
  Administered 2015-12-23: 1 mg via INTRAVENOUS
  Filled 2015-12-23: qty 1

## 2015-12-23 MED ORDER — DEXTROSE 5 % IV SOLN
1.0000 g | INTRAVENOUS | Status: DC
  Administered 2015-12-23 – 2015-12-26 (×4): 1 g via INTRAVENOUS
  Filled 2015-12-23 (×6): qty 10

## 2015-12-23 NOTE — H&P (Signed)
History and Physical    [name] [id number] DOB: [birth year] DOA: 12/23/2015   PCP: No PCP Per Patient   Patient coming from/Resides with: Skilled nursing facility  Chief Complaint: Altered mentation  HPI: [name] is a 60 y.o. male with medical history significant for chronic kidney disease on dialysis, known seizure disorder on Keppra, hypertension with LVH, anemia of chronic kidney disease, dyslipidemia, prolonged QT interval on EKG which is chronic, history of polysubstance abuse with prior admissions for alcohol withdrawal, history of cocaine use, history of hepatitis C anybody positive and prior hepatitis B infection, and acute GI bleeding during previous admission August of this year. 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 He was recently discharged on 8/25. He had been admitted due to altered mentation presumed related to polypharmacy. Because of chronic kidney disease on dialysis and no urinary urine drug screen was unable to be obtained. During that hospitalization patient's melatonin and Zoloft were discontinued and his narcotic dosage decreased. He was sent back to skilled nursing facility. Patient's RN was finally able to get in contact with the nursing facility and speak with an RN responsible for the patient. It is reported that on Saturday when he returned from dialysis treatment that he was slightly altered then and somewhat tremulous.  Today the patient was sent to the ER from dialysis after they noticed altered mentation. He was attempting to verbalize but responses were unintelligible and very soft. He was unable to follow commands but was spontaneously moving all of his extremities. He last completed dialysis on Saturday. In the ER his initial rectal temperature was 95.5, he was hypertensive, not hypoxic but in setting of altered mentation and known dialysis patient at risk for bacterial infections ER treated him as sepsis protocol. He has remained  hypertensive and altered with normal lactic acid, no leukocytosis or elevation in neutrophils and no definitive source although blood cultures collected and pending. His other labs are at baseline for him given need for dialysis which is due today. This felt at this juncture patient did not have sepsis etiology to his altered mentation and other etiologies are now being pursued.   ED Course:  Vital Signs: BP 184/83   Pulse 69   Temp 97.5 F (36.4 C) (Oral)   Resp 22   Ht 5' 10"  (1.778 m)   Wt 59 kg (130 lb)   SpO2 100%   BMI 18.65 kg/m  PCXR: Mild perihilar interstitial prominence today may reflect low-grade interstitial edema or early interstitial pneumonia Lab data: Sodium 139, potassium 4.2, chloride 96, BUN 31, creatinine 7.13, calcium 11.9, albumin 3.2, lactic acid 0.66 and 0.50, WBCs 5500 with normal differential, hemoglobin 12.4, platelets 190,000, glucose 84, urinalysis of tainted with cloudy appearance, few bacteria, glucose 250, pH 8.5, protein greater than 300, negative leukocytes negative nitrite, greater than 6-30 WBCs, urine drug screen negative, blood cultures obtained in ER as well as urine culture Medications and treatments: Zosyn 3.375 g IV 1, vancomycin 1250 mg IV 1  Review of Systems:  **Unable to obtain from patient due to altered mentation therefore history is obtained from the medical record   Past Medical History:  Diagnosis Date  . Anemia, chronic disease    a. Capsule endoscopy reportedly negative 08/2013. b. seen by heme with concern for minor B cell population on BMB, being observed.  . Arthritis    "right shoulder" (11/09/2012)  . Chronic diastolic CHF (congestive heart failure) (HCC)    a. EF 60 -  65% per [place] echo 11/2011. b. Echo 02/2012: severe LVH, focal basal hypertrophy, EF 60-65%, mild Mr.  . Cocaine abuse    mentioned in notes from [facility]  . ESRD (end stage renal disease) on dialysis [facility])    "TTS; [address]" (06/19/2015)  . ESRD on  hemodialysis ([place]) since 2012   a. Since 2012. ESRD was due to "drugs", primarily used cocaine.  Has 3-5 year hx of HTN, no DM.  Gets HD on TTS schedule at [facility]. Originally from [place].   . GI bleed 05/04/2012  . Head injury, closed, with concussion ([place])   . Headache(784.0)   . Helicobacter pylori gastritis    not defined if this was treated  . Hematochezia   . Hepatitis B core antibody positive    03/01/10  . Hepatitis C antibody test positive    was HIV negative, 02/28/12  . History of blood transfusion   . Hypercalcemia   . Hyperpotassemia   . Hypertension   . Hypertensive heart disease   . Optic neuritis, left   . Polyp of colon, adenomatous    May 2012.  Dr Trenton Founds in [place]  . Positive QuantiFERON-TB Gold test    11/2011  . Protein-calorie malnutrition, severe ([place])   . Seizure disorder [facility])    questionable history of - will need to clarify with PCP  . Tobacco abuse     Past Surgical History:  Procedure Laterality Date  . [place] TRANSPOSITION  03/07/2012   Procedure: BASCILIC VEIN TRANSPOSITION;  Surgeon: Conrad Dyersburg, MD;  Location: [facility];  Service: Vascular;  Laterality: Left;  First Stage  . BASCILIC VEIN TRANSPOSITION Left 05/31/2012   Procedure: BASCILIC VEIN TRANSPOSITION;  Surgeon: Conrad Aurora, MD;  Location: [facility];  Service: Vascular;  Laterality: Left;  Left 2nd Stage Basilic Vein Transposition with gortex graft revision using 50mx10cm graft  . [name] CAPSULE STUDY N/A 08/27/2013   Procedure: [name] CAPSULE STUDY;  Surgeon: DMilus Banister MD;  [place] [facility]  Service: Endoscopy;  Laterality: N/A;  . INSERTION OF DIALYSIS CATHETER     right chest  . JOINT REPLACEMENT    . SHOULDER OPEN ROTATOR CUFF REPAIR Right   . TOTAL KNEE ARTHROPLASTY Left     Social History   Social History  . Marital status: Single    Spouse name: N/A  . Number of children: 1  . Years of education: N/A   Occupational History  . Unemployed     Social History Main Topics  . Smoking status: Current Every Day Smoker    Packs/day: 1.50    Years: 45.00    Types: Cigarettes  . Smokeless tobacco: Never Used  . Alcohol use 14.4 oz/week    24 Cans of beer per week     Comment: 1 40's per day  . Drug use:     Types: "Crack" cocaine, Cocaine, Marijuana     Comment: 06/19/2015 "still use but don't use drugs very much"  . Sexual activity: Yes    Birth control/ protection: None   Other Topics Concern  . Not on file   Social History Narrative  . No narrative on file    Mobility: Previous PT evaluation demonstrated moderate assist and requirement for rolling walker with one person holding the hand to assist-discharge recommendation was for skilled nursing facility Work history: Disabled   Allergies  Allergen Reactions  . Reglan [Metoclopramide] Other (See Comments)    Tardive dyskinesia in 11/2011  in [place]    Family History  Problem Relation Age of Onset  . Diabetes Mother   . Hypertension Mother   . Stroke Mother   . Kidney failure Mother   . Cancer Father      Prior to Admission medications   Medication Sig Start Date End Date Taking? Authorizing Provider  amantadine (SYMMETREL) 100 MG capsule Take 100 mg by mouth 3 (three) times daily.    Historical Provider, MD  aspirin EC 81 MG EC tablet Take 1 tablet (81 mg total) by mouth daily. 08/20/15   Ripudeep Krystal Eaton, MD  atorvastatin (LIPITOR) 40 MG tablet Take 1 tablet (40 mg total) by mouth at bedtime. 08/20/15   Ripudeep Krystal Eaton, MD  B Complex-C-Folic Acid (RENA-VITE PO) Take 1 tablet by mouth daily.    Historical Provider, MD  calcium acetate (PHOSLO) 667 MG capsule Take 667 mg by mouth 3 (three) times daily with meals.     Historical Provider, MD  docusate sodium (COLACE) 100 MG capsule Take 100 mg by mouth 2 (two) times daily.    Historical Provider, MD  gabapentin (NEURONTIN) 100 MG capsule Take 200 mg by mouth every 12 (twelve) hours.     Historical Provider, MD   glucagon (GLUCAGON EMERGENCY) 1 MG injection Inject 1 mg into the vein once as needed.    Historical Provider, MD  hydrALAZINE (APRESOLINE) 100 MG tablet Take 1 tablet (100 mg total) by mouth every 8 (eight) hours. 12/08/15   Orson Eva, MD  HYDROcodone-acetaminophen (NORCO/VICODIN) 5-325 MG tablet Take 1 tablet by mouth every 6 (six) hours as needed (for pain). 12/12/15   Thurnell Lose, MD  labetalol (NORMODYNE) 100 MG tablet Take 1 tablet (100 mg total) by mouth 3 (three) times daily. 12/08/15   Orson Eva, MD  levETIRAcetam (KEPPRA) 500 MG tablet Take 500 mg by mouth 2 (two) times daily.    Historical Provider, MD  losartan (COZAAR) 100 MG tablet Take 100 mg by mouth at bedtime.  10/02/14   Historical Provider, MD  mirtazapine (REMERON) 7.5 MG tablet Take 7.5 mg by mouth at bedtime.    Historical Provider, MD  ondansetron (ZOFRAN) 4 MG tablet Take 4 mg by mouth every 8 (eight) hours as needed for nausea or vomiting.    Historical Provider, MD  polyethylene glycol (MIRALAX / GLYCOLAX) packet Take 17 g by mouth daily. 08/26/15   Bonnell Public, MD  saccharomyces boulardii (FLORASTOR) 250 MG capsule Take 250 mg by mouth daily.    Historical Provider, MD  senna-docusate (SENOKOT-S) 8.6-50 MG tablet Take 1 tablet by mouth 2 (two) times daily. For constipation 08/21/15   Ripudeep Krystal Eaton, MD  verapamil (CALAN) 120 MG tablet Take 1 tablet (120 mg total) by mouth every 8 (eight) hours. Patient taking differently: Take 120 mg by mouth 3 (three) times daily.  08/20/15   Ripudeep Krystal Eaton, MD    Physical Exam: Vitals:   12/23/15 1100 12/23/15 1115 12/23/15 1130 12/23/15 1145  BP: 190/72 (!) 178/115 184/83 (!) 196/36  Pulse: 68 69 69 68  Resp: 14 17 22 14   Temp:      TempSrc:      SpO2: 100% 100% 100% 100%  Weight:      Height:          Constitutional: NAD, calm, Appears to be comfortable but given altered mentation he is unable to verbalize such; looks much older than stated age Eyes: PERRL, lids and  conjunctivae normal ENMT: Mucous  membranes are moist. Posterior pharynx clear of any exudate or lesions.Normal dentition.  Neck: normal, supple, no masses, no thyromegaly Respiratory: clear to auscultation bilaterally, no wheezing, no crackles. Normal respiratory effort. No accessory muscle use.  Cardiovascular: Regular rate and rhythm, no murmurs / rubs / gallops. No extremity edema. 2+ pedal pulses. No carotid bruits.  Abdomen: no tenderness, no masses palpated. No hepatosplenomegaly. Bowel sounds positive.  Musculoskeletal: no clubbing / cyanosis. No joint deformity upper and lower extremities. Good ROM, no contractures. Normal muscle tone.  Skin: no rashes, lesions, ulcers. No induration-staple line right BKA stump unremarkable without redness or drainage or tenderness or swelling Neurologic: CN 2-12 grossly intact. Pupils are profoundly dilated 5 mm and patient resists pupil check by squeezing eyelids together, Sensation intact, DTR normal. Strength 5/5 x all 3 extremities. (s/p right BKA) with attempts to follow simple commands he tries to move both upper extremities which he keeps flexed up towards his chest but is unable to straighten them on command and appears somewhat tremulous with this attempt Psychiatric: Alert and unable to determine if oriented given limited verbal response which is primarily unintelligible sounds. Flat occasionally anxious mood.    Labs on Admission: I have personally reviewed following labs and imaging studies  CBC:  Recent Labs Lab 12/23/15 0814  WBC 5.5  NEUTROABS 4.0  HGB 12.4*  HCT 41.2  MCV 93.8  PLT 400   Basic Metabolic Panel:  Recent Labs Lab 12/23/15 0814  NA 139  K 4.2  CL 96*  CO2 30  GLUCOSE 84  BUN 31*  CREATININE 7.13*  CALCIUM 11.9*   GFR: Estimated Creatinine Clearance: 9.4 mL/min (by C-G formula based on SCr of 7.13 mg/dL). Liver Function Tests:  Recent Labs Lab 12/23/15 0814  AST 26  ALT 14*  ALKPHOS 70  BILITOT  0.8  PROT 7.4  ALBUMIN 3.2*   No results for input(s): LIPASE, AMYLASE in the last 168 hours. No results for input(s): AMMONIA in the last 168 hours. Coagulation Profile: No results for input(s): INR, PROTIME in the last 168 hours. Cardiac Enzymes: No results for input(s): CKTOTAL, CKMB, CKMBINDEX, TROPONINI in the last 168 hours. BNP (last 3 results) No results for input(s): PROBNP in the last 8760 hours. HbA1C: No results for input(s): HGBA1C in the last 72 hours. CBG:  Recent Labs Lab 12/23/15 0717  GLUCAP 81   Lipid Profile: No results for input(s): CHOL, HDL, LDLCALC, TRIG, CHOLHDL, LDLDIRECT in the last 72 hours. Thyroid Function Tests: No results for input(s): TSH, T4TOTAL, FREET4, T3FREE, THYROIDAB in the last 72 hours. Anemia Panel: No results for input(s): VITAMINB12, FOLATE, FERRITIN, TIBC, IRON, RETICCTPCT in the last 72 hours. Urine analysis:    Component Value Date/Time   COLORURINE YELLOW 12/23/2015 1057   APPEARANCEUR CLOUDY (A) 12/23/2015 1057   LABSPEC 1.019 12/23/2015 1057   PHURINE 8.5 (H) 12/23/2015 1057   GLUCOSEU 250 (A) 12/23/2015 1057   HGBUR NEGATIVE 12/23/2015 1057   BILIRUBINUR NEGATIVE 12/23/2015 1057   KETONESUR NEGATIVE 12/23/2015 1057   PROTEINUR >300 (A) 12/23/2015 1057   UROBILINOGEN 0.2 11/07/2012 1903   NITRITE NEGATIVE 12/23/2015 1057   LEUKOCYTESUR NEGATIVE 12/23/2015 1057   Sepsis Labs: @LABRCNTIP (procalcitonin:4,lacticidven:4) )No results found for this or any previous visit (from the past 240 hour(s)).   Radiological Exams on Admission: Dg Chest Port 1 View  Result Date: 12/23/2015 CLINICAL DATA:  Sepsis, unable to communicate, history of chronic CHF, hepatitis-B and [place], positive PPD, dialysis dependent renal failure. EXAM: PORTABLE  CHEST 1 VIEW COMPARISON:  Portable chest x-ray of December 12, 2015 FINDINGS: The lungs are slightly less well inflated today. The interstitial markings are coarse in the perihilar regions and  left mid lung. The cardiac silhouette is top-normal in size. The pulmonary vascularity is normal. There is calcification in the wall of the aortic arch. The trachea is midline. The bony thorax exhibits no acute abnormality. There degenerative changes of both shoulders. IMPRESSION: Mild perihilar interstitial prominence today may reflect low-grade interstitial edema or early interstitial pneumonia. Aortic atherosclerosis. Electronically Signed   By: David  Martinique M.D.   On: 12/23/2015 08:10    EKG: (Independently reviewed) sinus rhythm with ventricular rate 64 bpm, QTC 572 (documented chronic elevations), no ischemic changes, voltage criteria met for LVH-no change from prior EKG  Assessment/Plan Principal Problem:   Acute encephalopathy -Recurrent with recent admission for same 2 weeks ago -Patient presented with altered mental status which appears to be progressive since Saturday with nonspecific neurological deficits of incoherent speech difficulty following commands and tremulous motor activity with attempts to move upper extremities; h/o seizure disorder on Keppra; known history of polypharmacy and polysubstance abuse but current urine drug screen negative -Differential includes subclinical status epilepticus, infection, PRES -Patient presents with recurrent altered mentation of uncertain etiology in setting of uncontrolled hypertension and need for dialysis; there is also the possibility of underlying urinary tract infection. Given patient's degree of altered mentation he is currently unsafe for oral intake. Because of all of these issues I anticipate a minimum 2 midnight stay. -EEG; continue Keppra but give IV -Stat MRI brain to clarify if underlying PRES or other causes-CT had previous admission negative -Treat underlying infectious causes -Monitor to see if mentation improves after dialysis which could point to medications as the source -NPO until mentation improved -Also holding preadmission  amantadine, Neurontin and Remeron  ** 1300: RN has called twice from MRI due to patient's restlessness and occasional combativeness that did not respond to his initial dose of 1 mg Ativan while in ER. Discussed possibility of getting a second dose of Ativan but opted to cancel study for now and allow patient to proceed with dialysis and consider reordering MRI if patient was agitated in a.m.  Active Problems:   Uncontrolled hypertension -Systolic blood pressure not significantly elevated it has remained greater than 180 since arrival -Unsafe for oral intake so home medications on hold (Normodyne, hydralazine, and Calan) -prn IV hydralazine -Suspect hemodialysis will decrease blood pressure as well -MRI as above-IV Ativan for sedation and to treat possible seizure activity (see below)    Seizure disorder  -During previous admission EEG revealed mild diffuse slowing of the background but was without epileptiform discharges- this was more consistent with toxic metabolic encephalopathy/medication effect or other issues such as hypoxia/ischemia or neuro degenerative disorder -Continue IV Keppra and check serum level -Repeat EEG this admission -We'll give 1 time dose of Ativan in the event this is seizure activity-if this improves his symptomatology this may help clarify if underlying subclinical seizure activity despite typical Keppra dosing    Abnormal urinalysis -Demonstrates 6-30 WBCs and few bacteria but no leukocytes no nitrite but given presentation and have added empiric Rocephin until urine culture returned -Blood cultures were obtained but EDP due to concerns of possible sepsis and patient was given 1 dose of Zosyn and vancomycin in the ER    ESRD on hemodialysis  -Nephrology consulted by EDP and patient to go to dialysis today    Anemia of chronic  kidney failure -Patient actually appears hemoconcentrated with a hemoglobin of 12.4 with baseline of 10.1    Protein-calorie malnutrition,  severe  -Once mentation improved and patient deemed appropriate to take orals, may benefit from nutritional consultation    Prolonged Q-T interval on ECG -Documented as chronic -Avoid offending medications such as Haldol, Zofran or flora quinolones    HLD (hyperlipidemia) -Hold preadmission statin      DVT prophylaxis: Subcutaneous heparin  Code Status: Full code based on last admission Family Communication: No family at bedside at time of admission ; face sheet reveals no relatives only friends Disposition Plan: Anticipate discharge back to skilled nursing facility once medically stable Consults called: None  Admission status: Inpatient/telemetry     ELLIS,ALLISON L. ANP-BC [facility] Pager [phone]   If 7PM-7AM, please contact night-coverage [email or web address] Password [email or web address]  12/23/2015, 12:08 PM

## 2015-12-23 NOTE — Procedures (Signed)
  I was present at this dialysis session, have reviewed the session itself and made  appropriate changes Kelly Splinter MD [place] pager [phone]    cell [phone] 12/23/2015, 3:55 PM

## 2015-12-23 NOTE — ED Notes (Signed)
IV attempted x2 without success.

## 2015-12-23 NOTE — ED Provider Notes (Signed)
[facility] DEPT Provider Note   CSN: [id number] Arrival date & time: 12/23/15  [id number]     History   Chief Complaint Chief Complaint  Patient presents with  . Altered Mental Status    [name] is a 60 y.o. male pmhx of seizure disorder, GI bleed, ESRD, recent BKA presenting with altered mental status. Patient presented from dialysis altered today. Per dialysis team, patient is normal talkative. However was presented from SNF, making only unintelligible responses. EMS was called and patient arrived in the ED for work up. No further information was obtained from SNF.       Past Medical History:  Diagnosis Date  . Anemia, chronic disease    a. Capsule endoscopy reportedly negative 08/2013. b. seen by heme with concern for minor B cell population on BMB, being observed.  . Arthritis    "right shoulder" (11/09/2012)  . Chronic diastolic CHF (congestive heart failure) ([place])    a. EF 60 - 65% per [place] echo 11/2011. b. Echo 02/2012: severe LVH, focal basal hypertrophy, EF 60-65%, mild Mr.  . Cocaine abuse    mentioned in notes from [facility]  . ESRD (end stage renal disease) on dialysis [facility])    "TTS; [address]" (06/19/2015)  . ESRD on hemodialysis ([place]) since 2012   a. Since 2012. ESRD was due to "drugs", primarily used cocaine.  Has 3-5 year hx of HTN, no DM.  Gets HD on TTS schedule at [facility]. Originally from [place].   . GI bleed 05/04/2012  . Head injury, closed, with concussion ([place])   . Headache(784.0)   . Helicobacter pylori gastritis    not defined if this was treated  . Hematochezia   . Hepatitis B core antibody positive    03/01/10  . Hepatitis C antibody test positive    was HIV negative, 02/28/12  . History of blood transfusion   . Hypercalcemia   . Hyperpotassemia   . Hypertension   . Hypertensive heart disease   . Optic neuritis, left   . Polyp of colon, adenomatous    May 2012.  Dr Trenton Founds in [place]  . Positive QuantiFERON-TB Gold test     11/2011  . Protein-calorie malnutrition, severe ([place])   . Seizure disorder [facility])    questionable history of - will need to clarify with PCP  . Tobacco abuse     Patient Active Problem List   Diagnosis Date Noted  . Abnormal urinalysis 12/23/2015  . Mental status change   . Polysubstance abuse 09/17/2015  . HLD (hyperlipidemia) 09/17/2015  . Physical deconditioning   . Alcoholism /alcohol abuse ([place])   . NSTEMI (non-ST elevated myocardial infarction) ([place]) 08/16/2015  . Hepatitis C antibody test positive 06/23/2015  . History of hepatitis B virus infection 06/20/2015  . Acute encephalopathy 06/20/2015  . Alcohol withdrawal ([place]) 06/19/2015  . Acute GI bleeding 04/12/2015  . LVH (left ventricular hypertrophy) due to hypertensive disease 04/12/2015  . Prolonged Q-T interval on ECG 04/12/2015  . Anemia of chronic kidney failure 04/26/2014  . Hyperparathyroidism, secondary renal ([place]) 04/26/2014  . Protein-calorie malnutrition, severe ([place]) 01/25/2014  . Optic neuritis, left 01/19/2014  . Cocaine abuse 03/02/2012  . Uncontrolled hypertension   . ESRD on hemodialysis ([place])   . Seizure disorder [facility])     Past Surgical History:  Procedure Laterality Date  . [place] TRANSPOSITION  03/07/2012   Procedure: BASCILIC VEIN TRANSPOSITION;  Surgeon: Conrad County Line, MD;  Location: [facility];  Service: Vascular;  Laterality: Left;  First Stage  . BASCILIC VEIN TRANSPOSITION Left 05/31/2012   Procedure: BASCILIC VEIN TRANSPOSITION;  Surgeon: Conrad Salina, MD;  Location: [facility];  Service: Vascular;  Laterality: Left;  Left 2nd Stage Basilic Vein Transposition with gortex graft revision using 47mmx10cm graft  . [name] CAPSULE STUDY N/A 08/27/2013   Procedure: [name] CAPSULE STUDY;  Surgeon: Milus Banister, MD;  Location: [facility];  Service: Endoscopy;  Laterality: N/A;  . INSERTION OF DIALYSIS CATHETER     right chest  . JOINT REPLACEMENT    . SHOULDER OPEN ROTATOR CUFF REPAIR Right   .  TOTAL KNEE ARTHROPLASTY Left        Home Medications    Prior to Admission medications   Medication Sig Start Date End Date Taking? Authorizing Provider  amantadine (SYMMETREL) 100 MG capsule Take 100 mg by mouth 3 (three) times daily.    Historical Provider, MD  aspirin EC 81 MG EC tablet Take 1 tablet (81 mg total) by mouth daily. 08/20/15   Ripudeep Krystal Eaton, MD  atorvastatin (LIPITOR) 40 MG tablet Take 1 tablet (40 mg total) by mouth at bedtime. 08/20/15   Ripudeep Krystal Eaton, MD  B Complex-C-Folic Acid (RENA-VITE PO) Take 1 tablet by mouth daily.    Historical Provider, MD  calcium acetate (PHOSLO) 667 MG capsule Take 667 mg by mouth 3 (three) times daily with meals.     Historical Provider, MD  docusate sodium (COLACE) 100 MG capsule Take 100 mg by mouth 2 (two) times daily.    Historical Provider, MD  gabapentin (NEURONTIN) 100 MG capsule Take 200 mg by mouth every 12 (twelve) hours.     Historical Provider, MD  glucagon (GLUCAGON EMERGENCY) 1 MG injection Inject 1 mg into the vein once as needed.    Historical Provider, MD  hydrALAZINE (APRESOLINE) 100 MG tablet Take 1 tablet (100 mg total) by mouth every 8 (eight) hours. 12/08/15   Orson Eva, MD  HYDROcodone-acetaminophen (NORCO/VICODIN) 5-325 MG tablet Take 1 tablet by mouth every 6 (six) hours as needed (for pain). 12/12/15   Thurnell Lose, MD  labetalol (NORMODYNE) 100 MG tablet Take 1 tablet (100 mg total) by mouth 3 (three) times daily. 12/08/15   Orson Eva, MD  levETIRAcetam (KEPPRA) 500 MG tablet Take 500 mg by mouth 2 (two) times daily.    Historical Provider, MD  losartan (COZAAR) 100 MG tablet Take 100 mg by mouth at bedtime.  10/02/14   Historical Provider, MD  mirtazapine (REMERON) 7.5 MG tablet Take 7.5 mg by mouth at bedtime.    Historical Provider, MD  ondansetron (ZOFRAN) 4 MG tablet Take 4 mg by mouth every 8 (eight) hours as needed for nausea or vomiting.    Historical Provider, MD  polyethylene glycol (MIRALAX / GLYCOLAX)  packet Take 17 g by mouth daily. 08/26/15   Bonnell Public, MD  saccharomyces boulardii (FLORASTOR) 250 MG capsule Take 250 mg by mouth daily.    Historical Provider, MD  senna-docusate (SENOKOT-S) 8.6-50 MG tablet Take 1 tablet by mouth 2 (two) times daily. For constipation 08/21/15   Ripudeep Krystal Eaton, MD  verapamil (CALAN) 120 MG tablet Take 1 tablet (120 mg total) by mouth every 8 (eight) hours. Patient taking differently: Take 120 mg by mouth 3 (three) times daily.  08/20/15   Ripudeep Krystal Eaton, MD    Family History Family History  Problem Relation Age of Onset  . Diabetes Mother   . Hypertension Mother   .  Stroke Mother   . Kidney failure Mother   . Cancer Father     Social History Social History  Substance Use Topics  . Smoking status: Current Every Day Smoker    Packs/day: 1.50    Years: 45.00    Types: Cigarettes  . Smokeless tobacco: Never Used  . Alcohol use 14.4 oz/week    24 Cans of beer per week     Comment: 1 40's per day     Allergies   Reglan [metoclopramide]   Review of Systems Review of Systems  Reason unable to perform ROS: Unresponsive.     Physical Exam Updated Vital Signs BP 179/92   Pulse 70   Temp 97.3 F (36.3 C) (Oral)   Resp 17   Ht 5\' 10"  (1.778 m)   Wt 59 kg   SpO2 100%   BMI 18.65 kg/m   Physical Exam  Constitutional:  Malnourished elderly gentleman  HENT:  Head: Normocephalic and atraumatic.  Mouth/Throat: Oropharynx is clear and moist.  Eyes: Conjunctivae are normal.  Neck: Normal range of motion.  Cardiovascular: Normal rate, regular rhythm, normal heart sounds and intact distal pulses.   Pulmonary/Chest: Effort normal and breath sounds normal.  Abdominal: Soft. Bowel sounds are normal.  Musculoskeletal: Normal range of motion.  Neurological:  Altered   Skin: Skin is warm.     ED Treatments / Results  Labs (all labs ordered are listed, but only abnormal results are displayed) Labs Reviewed  COMPREHENSIVE METABOLIC  PANEL - Abnormal; Notable for the following:       Result Value   Chloride 96 (*)    BUN 31 (*)    Creatinine, Ser 7.13 (*)    Calcium 11.9 (*)    Albumin 3.2 (*)    ALT 14 (*)    GFR calc non Af Amer 8 (*)    GFR calc Af Amer 9 (*)    All other components within normal limits  CBC WITH DIFFERENTIAL/PLATELET - Abnormal; Notable for the following:    Hemoglobin 12.4 (*)    RDW 16.3 (*)    All other components within normal limits  URINALYSIS, ROUTINE W REFLEX MICROSCOPIC (NOT AT [facility]) - Abnormal; Notable for the following:    APPearance CLOUDY (*)    pH 8.5 (*)    Glucose, UA 250 (*)    Protein, ur >300 (*)    All other components within normal limits  URINE MICROSCOPIC-ADD ON - Abnormal; Notable for the following:    Squamous Epithelial / LPF 0-5 (*)    Bacteria, UA FEW (*)    All other components within normal limits  CULTURE, BLOOD (ROUTINE X 2)  CULTURE, BLOOD (ROUTINE X 2)  URINE CULTURE  URINE RAPID DRUG SCREEN, HOSP PERFORMED  LEVETIRACETAM LEVEL  DRUG SCREEN 10 W/CONF, SERUM  CBG MONITORING, ED  I-STAT CG4 LACTIC ACID, ED  I-STAT CG4 LACTIC ACID, ED    EKG  EKG Interpretation  Date/Time:  Tuesday December 23 2015 07:20:41 EDT Ventricular Rate:  64 PR Interval:    QRS Duration: 110 QT Interval:  554 QTC Calculation: 572 R Axis:   66 Text Interpretation:  Sinus rhythm Left ventricular hypertrophy Repol abnrm suggests ischemia, lateral leads Prolonged QT interval Non-specific ST-t changes Confirmed by Ashok Cordia  MD, Lennette Bihari ([id number]) on 12/23/2015 7:51:36 AM Also confirmed by Ashok Cordia  MD, Lennette Bihari ([id number]), editor [place], Leda Gauze [phone])  on 12/23/2015 9:00:53 AM       Radiology Dg Chest [facility]  Result Date: 12/23/2015 CLINICAL DATA:  Sepsis, unable to communicate, history of chronic CHF, hepatitis-B and C[place], positive PPD, dialysis dependent renal failure. EXAM: PORTABLE CHEST 1 VIEW COMPARISON:  Portable chest x-ray of December 12, 2015 FINDINGS: The lungs are  slightly less well inflated today. The interstitial markings are coarse in the perihilar regions and left mid lung. The cardiac silhouette is top-normal in size. The pulmonary vascularity is normal. There is calcification in the wall of the aortic arch. The trachea is midline. The bony thorax exhibits no acute abnormality. There degenerative changes of both shoulders. IMPRESSION: Mild perihilar interstitial prominence today may reflect low-grade interstitial edema or early interstitial pneumonia. Aortic atherosclerosis. Electronically Signed   By: David  Martinique M.D.   On: 12/23/2015 08:10    Procedures Procedures (including critical care time)  Medications Ordered in ED Medications  vancomycin (VANCOCIN) 500 mg in sodium chloride 0.9 % 100 mL IVPB (not administered)  hydrALAZINE (APRESOLINE) injection 10 mg (10 mg Intravenous Given 12/23/15 1207)  piperacillin-tazobactam (ZOSYN) IVPB 3.375 g (0 g Intravenous Stopped 12/23/15 0940)  vancomycin (VANCOCIN) 1,250 mg in sodium chloride 0.9 % 250 mL IVPB (0 mg Intravenous Stopped 12/23/15 1100)  LORazepam (ATIVAN) injection 1 mg (1 mg Intravenous Given 12/23/15 1224)     Initial Impression / Assessment and Plan / ED Course  I have reviewed the triage vital signs and the nursing notes.  Pertinent labs & imaging results that were available during my care of the patient were reviewed by me and considered in my medical decision making (see chart for details).Temperature of 95.5 on admission with altered mental status. Therefore code sepsis was called. However patient without any leukocytosis or lactic acidosis. BKA showing no signs of infection, CXR with perhilar edema likely due to the lack of dialysis, unlikely infectious source. Findings did include hypercalcinemia of 12.5 which can result in alteration, nephrology consulted for dialysis. EKG was wnl. Drug screen negative. Patient with recent CT imaging following ED admission for similar issues, therefore held  off on imaging.  Etiology undetermined/mulitifactorial as patient is currently on several sedating medications.   Clinical Course  Value Comment By Time  Calcium: (!) 11.9 (Reviewed) Song Garris Cletis Media, MD 09/05 [phone]  Calcium: (!) 11.9 (Reviewed) Promyse Ardito Cletis Media, MD 09/05 [phone]  Calcium: (!) 11.9 (Reviewed) Bliss Tsang Cletis Media, MD 09/05 [phone]     Final Clinical Impressions(s) / ED Diagnoses   Final diagnoses:  Altered mental state    New Prescriptions New Prescriptions   No medications on file     Menaal Russum Cletis Media, MD 12/23/15 [place], MD 12/23/15 1525

## 2015-12-23 NOTE — Plan of Care (Signed)
Problem: Education: Goal: Knowledge of [facility] Education information/materials will improve Outcome: Progressing Pt alert and oriented to himself only.Needs reinforcement r/t to education.   Problem: Safety: Goal: Ability to remain free from injury will improve Outcome: Progressing Bed alarm on, call light within reach, 3 side rails up. Educate pt to use call light for ambulation assistance.

## 2015-12-23 NOTE — Care Management Note (Signed)
Case Management Note  Patient Details  Name: [name] MRN: [id number] Date of Birth: 06-02-1955  Subjective/Objective:                   60 y.o. male pmhx of seizure disorder, GI bleed, ESRD, recent BKA presenting with altered mental status. Patient presented from dialysis altered today. [name]  Action/Plan: Follow for disposition needs. /Return to SNF upon discharge.   Expected Discharge Date:  12/26/15               Expected Discharge Plan:  Skilled Nursing Facility  In-House Referral:  Clinical Social Work  Discharge planning Services  CM Consult  Post Acute Care Choice:    Choice offered to:     DME Arranged:    DME Agency:     HH Arranged:    [name]:     Status of Service:  In process, will continue to follow  If discussed at Long Length of Stay Meetings, dates discussed:    Additional Comments: Pt is resident at [name]. Fuller Mandril, RN 12/23/2015, 1:50 PM

## 2015-12-23 NOTE — Progress Notes (Signed)
Pharmacy Antibiotic Note  [name] is a 60 y.o. male admitted on 12/23/2015 with sepsis.  Pharmacy has been consulted for vancomycin and zosyn dosing. Pt has history of ESRD on HD TTS with last HD session Saturday.  Pt received vancomycin 1250mg  and zosyn 3.375g IV once in the ED.  Plan: Vancomycin 500mg  IV qHD Zosyn 3.375g IV q12h Monitor culture data, HD plans and clinical course VT at SS prn  Height: 5\' 10"  (177.8 cm) Weight: 130 lb (59 kg) IBW/kg (Calculated) : 73  Temp (24hrs), Avg:95.5 F (35.3 C), Min:95.5 F (35.3 C), Max:95.5 F (35.3 C)  No results for input(s): WBC, CREATININE, LATICACIDVEN, VANCOTROUGH, VANCOPEAK, VANCORANDOM, GENTTROUGH, GENTPEAK, GENTRANDOM, TOBRATROUGH, TOBRAPEAK, TOBRARND, AMIKACINPEAK, AMIKACINTROU, AMIKACIN in the last 168 hours.  Estimated Creatinine Clearance: 14.5 mL/min (by C-G formula based on SCr of 4.64 mg/dL).    Allergies  Allergen Reactions  . Reglan [Metoclopramide] Other (See Comments)    Tardive dyskinesia in 11/2011 in [place]    Antimicrobials this admission: Vanc 9/5 >>  Zosyn 9/5 >>   Dose adjustments this admission: n/a  Microbiology results: 9/5 BCx: sent  UCx:    Sputum:    MRSA PCR:    Andrey Cota. Diona Foley, PharmD, [place] Clinical Pharmacist Pager [phone] 12/23/2015 7:57 AM

## 2015-12-23 NOTE — ED Notes (Signed)
MD at bedside. 

## 2015-12-23 NOTE — Progress Notes (Signed)
ATTEMPTED PT WITH 1mg  ATIVAN, PT STILL MOVING ALL OVER THE PLACE, CALLED NP WHO WANTED TO GIVE ANOTHER DOSE OF ATIVAN, RADIOLOGY RN DID NOT FEEL IT WOULD WORK, RN SPOKE WITH NP WHO AGREED AND WANTS TO SEND PT TO DIALYSIS, WILL TRY MRI AGAIN AT A BETTER TIME, DUE TO  PT'S CONDITION

## 2015-12-23 NOTE — ED Notes (Signed)
Dr. Marily Memos sates patient may come off telemetry for MRI.

## 2015-12-23 NOTE — Progress Notes (Signed)
Harriette Bouillon called and gave telephone consent. Contact no. of daughter Carolann Littler [phone] was also given.

## 2015-12-23 NOTE — ED Notes (Signed)
Patient transported to MRI 

## 2015-12-23 NOTE — ED Triage Notes (Signed)
Pt arrives EMS from dialysis where pt arrived this am and staff states he is altered. Pt alert and makes some very soft, un intelligible verbal response to questions. Does not follow command but moves extremities x 3. Recent right BKA. Last dialysis Saturday.

## 2015-12-23 NOTE — Consult Note (Signed)
[facility] Renal Consultation Note    Indication for Consultation:  Management of ESRD/hemodialysis; anemia, hypertension/volume and secondary hyperparathyroidism  HPI: [name] is a 60 y.o. male with ESRD on hemodialysis. His medical history is significant for hypertension, hepatitis C, hepatitis B core antibody positive, GI bleed, Chronic diastolic HF, medical noncompliance, seizure disorder, cocaine abuse.   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 Admitted with ABL anemia, transfused 4u prbc, likely LGIB, no intervention. AMS likely multifact including PSA, HTN and severe anemia. MS worse with higher BP's.  CT head neg, NH3 and B12 ok.  May be developing some baseline cognitive impairment.  Improved to baseline and dc'd Aug 23-25 2017 -  AMS/weakness -  Head CT with no acute findings Mild hyperglycemia on admission No tox screen done. Narcotic dosage decreased. Improved to baseline - alert and conversant at d/c   He presented to the ED today from SNF with altered mental status. ED course: Vitals Temp 95.22F BP 188/78 RR 11 SpO2 100%; Labs: K 4.2 WBC 5.5 Ca 11.9 Hgb 12.4 Lactic acid 0.66 CXR unremarkable. Blood and urine cultures collected. Sepsis protocol was initiated in ED and he was started on IV Vanc/Zosyn. [name] seen on HD currently. He responds to commands but is unable to form intelligible words. His last outpatient dialysis treatment was on 9/2 for 4 hours. Post HD weight was 51.3 kg. EDW recently lowered from 62.5kg to 56 kg.    Past  Medical History:  Diagnosis Date  . Anemia, chronic disease    a. Capsule endoscopy reportedly negative 08/2013. b. seen by heme with concern for minor B cell population on BMB, being observed.  . Arthritis    "right shoulder" (11/09/2012)  . Chronic diastolic CHF (congestive heart failure) ([place])    a. EF 60 - 65% per [place] echo 11/2011. b. Echo 02/2012: severe LVH, focal basal hypertrophy, EF 60-65%, mild Mr.  . Cocaine abuse    mentioned in notes from [place]  . ESRD (end stage renal disease) on dialysis [facility])    "TTS; [address]" (06/19/2015)  . ESRD on hemodialysis ([place]) since 2012   a. Since 2012. ESRD was due to "drugs", primarily used cocaine.  Has 3-5 year hx of HTN, no DM.  Gets HD on TTS schedule at [facility]. Originally from [place].   . GI bleed 05/04/2012  . Head injury, closed, with concussion ([place])   . Headache(784.0)   . Helicobacter pylori gastritis    not defined if this was treated  . Hematochezia   . Hepatitis B core antibody positive    03/01/10  . Hepatitis C antibody test positive    was HIV negative, 02/28/12  . History of blood transfusion   . Hypercalcemia   . Hyperpotassemia   . Hypertension   . Hypertensive heart disease   . Optic neuritis, left   . Polyp of colon, adenomatous    May 2012.  Dr Trenton Founds in [place]  . Positive QuantiFERON-TB Gold test    11/2011  .  Protein-calorie malnutrition, severe ([place])   . Seizure disorder [facility])    questionable history of - will need to clarify with PCP  . Tobacco abuse    Past Surgical History:  Procedure Laterality Date  . [place] TRANSPOSITION  03/07/2012   Procedure: BASCILIC VEIN TRANSPOSITION;  Surgeon: Conrad Hatfield, MD;  Location: [place];  Service: Vascular;  Laterality: Left;  First Stage  . BASCILIC VEIN TRANSPOSITION Left 05/31/2012   Procedure: BASCILIC VEIN TRANSPOSITION;  Surgeon: Conrad [place] River, MD;  Location: [place];  Service: Vascular;  Laterality: Left;  Left 2nd Stage Basilic Vein  Transposition with gortex graft revision using 100mmx10cm graft  . [name] CAPSULE STUDY N/A 08/27/2013   Procedure: [name] CAPSULE STUDY;  Surgeon: Milus Banister, MD;  Location: [place];  Service: Endoscopy;  Laterality: N/A;  . INSERTION OF DIALYSIS CATHETER     right chest  . JOINT REPLACEMENT    . SHOULDER OPEN ROTATOR CUFF REPAIR Right   . TOTAL KNEE ARTHROPLASTY Left    Family History  Problem Relation Age of Onset  . Diabetes Mother   . Hypertension Mother   . Stroke Mother   . Kidney failure Mother   . Cancer Father    Social History:  reports that he has been smoking Cigarettes.  He has a 67.50 pack-year smoking history. He has never used smokeless tobacco. He reports that he drinks about 14.4 oz of alcohol per week . He reports that he uses drugs, including "Crack" cocaine, Cocaine, and Marijuana. Allergies  Allergen Reactions  . Reglan [Metoclopramide] Other (See Comments)    Tardive dyskinesia in 11/2011 in [place]   Prior to Admission medications   Medication Sig Start Date End Date Taking? Authorizing Provider  amantadine (SYMMETREL) 100 MG capsule Take 100 mg by mouth 3 (three) times daily.    Historical Provider, MD  aspirin EC 81 MG EC tablet Take 1 tablet (81 mg total) by mouth daily. 08/20/15   Ripudeep Krystal Eaton, MD  atorvastatin (LIPITOR) 40 MG tablet Take 1 tablet (40 mg total) by mouth at bedtime. 08/20/15   Ripudeep Krystal Eaton, MD  B Complex-C-Folic Acid (RENA-VITE PO) Take 1 tablet by mouth daily.    Historical Provider, MD  calcium acetate (PHOSLO) 667 MG capsule Take 667 mg by mouth 3 (three) times daily with meals.     Historical Provider, MD  docusate sodium (COLACE) 100 MG capsule Take 100 mg by mouth 2 (two) times daily.    Historical Provider, MD  gabapentin (NEURONTIN) 100 MG capsule Take 200 mg by mouth every 12 (twelve) hours.     Historical Provider, MD  glucagon (GLUCAGON EMERGENCY) 1 MG injection Inject 1 mg into the vein once as needed.    Historical  Provider, MD  hydrALAZINE (APRESOLINE) 100 MG tablet Take 1 tablet (100 mg total) by mouth every 8 (eight) hours. 12/08/15   Orson Eva, MD  HYDROcodone-acetaminophen (NORCO/VICODIN) 5-325 MG tablet Take 1 tablet by mouth every 6 (six) hours as needed (for pain). 12/12/15   Thurnell Lose, MD  labetalol (NORMODYNE) 100 MG tablet Take 1 tablet (100 mg total) by mouth 3 (three) times daily. 12/08/15   Orson Eva, MD  levETIRAcetam (KEPPRA) 500 MG tablet Take 500 mg by mouth 2 (two) times daily.    Historical Provider, MD  losartan (COZAAR) 100 MG tablet Take 100 mg by mouth at bedtime.  10/02/14   Historical Provider, MD  mirtazapine (REMERON) 7.5 MG tablet Take 7.5  mg by mouth at bedtime.    Historical Provider, MD  ondansetron (ZOFRAN) 4 MG tablet Take 4 mg by mouth every 8 (eight) hours as needed for nausea or vomiting.    Historical Provider, MD  polyethylene glycol (MIRALAX / GLYCOLAX) packet Take 17 g by mouth daily. 08/26/15   Bonnell Public, MD  saccharomyces boulardii (FLORASTOR) 250 MG capsule Take 250 mg by mouth daily.    Historical Provider, MD  senna-docusate (SENOKOT-S) 8.6-50 MG tablet Take 1 tablet by mouth 2 (two) times daily. For constipation 08/21/15   Ripudeep Krystal Eaton, MD  verapamil (CALAN) 120 MG tablet Take 1 tablet (120 mg total) by mouth every 8 (eight) hours. Patient taking differently: Take 120 mg by mouth 3 (three) times daily.  08/20/15   Ripudeep Krystal Eaton, MD   Current Facility-Administered Medications  Medication Dose Route Frequency Provider Last Rate Last Dose  . hydrALAZINE (APRESOLINE) injection 10 mg  10 mg Intravenous Q6H PRN Samella Parr, NP   10 mg at 12/23/15 1207  . vancomycin (VANCOCIN) 500 mg in sodium chloride 0.9 % 100 mL IVPB  500 mg Intravenous Q T,Th,Sa-HD Rebecka Apley, [facility]       Current Outpatient Prescriptions  Medication Sig Dispense Refill  . amantadine (SYMMETREL) 100 MG capsule Take 100 mg by mouth 3 (three) times daily.    Marland Kitchen aspirin EC 81 MG EC  tablet Take 1 tablet (81 mg total) by mouth daily. 30 tablet 3  . atorvastatin (LIPITOR) 40 MG tablet Take 1 tablet (40 mg total) by mouth at bedtime. 30 tablet 3  . B Complex-C-Folic Acid (RENA-VITE PO) Take 1 tablet by mouth daily.    . calcium acetate (PHOSLO) 667 MG capsule Take 667 mg by mouth 3 (three) times daily with meals.     . docusate sodium (COLACE) 100 MG capsule Take 100 mg by mouth 2 (two) times daily.    Marland Kitchen gabapentin (NEURONTIN) 100 MG capsule Take 200 mg by mouth every 12 (twelve) hours.     Marland Kitchen glucagon (GLUCAGON EMERGENCY) 1 MG injection Inject 1 mg into the vein once as needed.    . hydrALAZINE (APRESOLINE) 100 MG tablet Take 1 tablet (100 mg total) by mouth every 8 (eight) hours. 90 tablet 0  . HYDROcodone-acetaminophen (NORCO/VICODIN) 5-325 MG tablet Take 1 tablet by mouth every 6 (six) hours as needed (for pain). 10 tablet 0  . labetalol (NORMODYNE) 100 MG tablet Take 1 tablet (100 mg total) by mouth 3 (three) times daily. 90 tablet 0  . levETIRAcetam (KEPPRA) 500 MG tablet Take 500 mg by mouth 2 (two) times daily.    Marland Kitchen losartan (COZAAR) 100 MG tablet Take 100 mg by mouth at bedtime.     . mirtazapine (REMERON) 7.5 MG tablet Take 7.5 mg by mouth at bedtime.    . ondansetron (ZOFRAN) 4 MG tablet Take 4 mg by mouth every 8 (eight) hours as needed for nausea or vomiting.    . polyethylene glycol (MIRALAX / GLYCOLAX) packet Take 17 g by mouth daily. 14 each 0  . saccharomyces boulardii (FLORASTOR) 250 MG capsule Take 250 mg by mouth daily.    Marland Kitchen senna-docusate (SENOKOT-S) 8.6-50 MG tablet Take 1 tablet by mouth 2 (two) times daily. For constipation 60 tablet 3  . verapamil (CALAN) 120 MG tablet Take 1 tablet (120 mg total) by mouth every 8 (eight) hours. (Patient taking differently: Take 120 mg by mouth 3 (three) times daily. ) 60 tablet 3  Labs: Basic Metabolic Panel:  Recent Labs Lab 12/23/15 0814  NA 139  K 4.2  CL 96*  CO2 30  GLUCOSE 84  BUN 31*  CREATININE 7.13*   CALCIUM 11.9*   Liver Function Tests:  Recent Labs Lab 12/23/15 0814  AST 26  ALT 14*  ALKPHOS 70  BILITOT 0.8  PROT 7.4  ALBUMIN 3.2*   No results for input(s): LIPASE, AMYLASE in the last 168 hours. No results for input(s): AMMONIA in the last 168 hours. CBC:  Recent Labs Lab 12/23/15 0814  WBC 5.5  NEUTROABS 4.0  HGB 12.4*  HCT 41.2  MCV 93.8  PLT 190   Cardiac Enzymes: No results for input(s): CKTOTAL, CKMB, CKMBINDEX, TROPONINI in the last 168 hours. CBG:  Recent Labs Lab 12/23/15 0717  GLUCAP 81   Iron Studies: No results for input(s): IRON, TIBC, TRANSFERRIN, FERRITIN in the last 72 hours. Studies/Results: Dg Chest Port 1 View  Result Date: 12/23/2015 CLINICAL DATA:  Sepsis, unable to communicate, history of chronic CHF, hepatitis-B and [place], positive PPD, dialysis dependent renal failure. EXAM: PORTABLE CHEST 1 VIEW COMPARISON:  Portable chest x-ray of December 12, 2015 FINDINGS: The lungs are slightly less well inflated today. The interstitial markings are coarse in the perihilar regions and left mid lung. The cardiac silhouette is top-normal in size. The pulmonary vascularity is normal. There is calcification in the wall of the aortic arch. The trachea is midline. The bony thorax exhibits no acute abnormality. There degenerative changes of both shoulders. IMPRESSION: Mild perihilar interstitial prominence today may reflect low-grade interstitial edema or early interstitial pneumonia. Aortic atherosclerosis. Electronically Signed   By: David  Martinique M.D.   On: 12/23/2015 08:10    ROS: As per HPI otherwise negative.  Physical Exam: Vitals:   12/23/15 1335 12/23/15 1350 12/23/15 1359 12/23/15 1405  BP: (!) 173/119  (!) 160/127 (!) 183/131  Pulse: 73 71 73 71  Resp: 18 18 14    Temp: 98.4 F (36.9 C) 98.4 F (36.9 C)    TempSrc: Oral Oral    SpO2: 100%     Weight: 59 kg (130 lb 1.1 oz)     Height:         General: Frail chronically ill appearing  elderly male NAD Head: NCAT sclera not icteric MMM Neck: Supple.  Lungs: CTA bilaterally without wheezes, rales, or rhonchi. Breathing is unlabored. Heart: RRR with S1 S2.  Abdomen: soft NT + BS M-S:  Arms flexed at elbows with with tremors - left arm restrained on HD Lower extremities: R BKA - site without erythema or drainage - no L LE edema  Neuro: Unable to assess  Psych:  Responds to questions - able to nod head - unable to form words  Dialysis Access: LUE AVF - cannulated on HD  Dialysis Orders: [facility] TTS 4h 2K/2.25 Ca UF Prof 2 EDW 56kg  LAVF No Heparin  Hectorol 7 mcg IV q treatment  Venofer 100 mg IV q week  Mircera 150 mcg IV q 2 weeks (last dose 12/13/15)  OP Labs: Hgb 11.2 Tsat 54% K 4.7 Corr Ca 10.3 iPTH 73  Assessment/Plan: 1. Altered mental status -recurrent  w/u per primary - unable to get brain MRI today d/t restlessness - EEG pending h/o of seizure and polysubstance abuse, but tox screen negative  Is on Keppra and neurontin which can cause CNS side effects.  Drug screen neg for narcotics.  Is well dialyzed so uremia not a contributor. Would hold neurontin,  ordered Keppra level. Hypercalcemia may be contributing.  Will dialyze on low Ca bath today. 2. Pyuria - ?UTI - few bacteria, 6-30 WBCs - urine/blood cx pending started on IV vanc/zosyn 3.  ESRD -  TTS- HD today on schedule  4.  Hypertension - BP elevated  on losartan/labetalol/hydralazine as outpatient - on IV hydralazine here   5. Volume -  Left below EDW at last OP HD - 3kg up today - UF goal 3L - CXR unremarkable No edema on exam  6.  Anemia  - Hgb 12.4 on outpatient ESA  7.  Metabolic bone disease -  Ca 11.9 - hold Ca acetate binders And hold Hectorol 8.  Nutrition - Alb 3.2 - NPO at present   Lynnda Child PA-C [place] Pager [phone] 12/23/2015, 2:36 PM   Pt seen, examined, agree w assess/plan as above with additions as indicated.  Kelly Splinter MD [facility] pager  [phone]    cell [phone] 12/23/2015, 3:55 PM

## 2015-12-24 ENCOUNTER — Inpatient Hospital Stay (HOSPITAL_COMMUNITY): Payer: Medicare Other

## 2015-12-24 DIAGNOSIS — G9340 Encephalopathy, unspecified: Principal | ICD-10-CM

## 2015-12-24 LAB — GLUCOSE, CAPILLARY
GLUCOSE-CAPILLARY: 73 mg/dL (ref 65–99)
GLUCOSE-CAPILLARY: 111 mg/dL — AB (ref 65–99)
GLUCOSE-CAPILLARY: 79 mg/dL (ref 65–99)
GLUCOSE-CAPILLARY: 48 mg/dL — AB (ref 65–99)
GLUCOSE-CAPILLARY: 131 mg/dL — AB (ref 65–99)
Glucose-Capillary: 105 mg/dL — ABNORMAL HIGH (ref 65–99)
Glucose-Capillary: 56 mg/dL — ABNORMAL LOW (ref 65–99)
Glucose-Capillary: 72 mg/dL (ref 65–99)

## 2015-12-24 LAB — COMPREHENSIVE METABOLIC PANEL
ALBUMIN: 3.2 g/dL — AB (ref 3.5–5.0)
ALK PHOS: 64 U/L (ref 38–126)
ALT: 12 U/L — ABNORMAL LOW (ref 17–63)
ANION GAP: 19 — AB (ref 5–15)
AST: 25 U/L (ref 15–41)
BILIRUBIN TOTAL: 0.9 mg/dL (ref 0.3–1.2)
BUN: 18 mg/dL (ref 6–20)
CALCIUM: 9.7 mg/dL (ref 8.9–10.3)
CO2: 24 mmol/L (ref 22–32)
Chloride: 93 mmol/L — ABNORMAL LOW (ref 101–111)
Creatinine, Ser: 5.03 mg/dL — ABNORMAL HIGH (ref 0.61–1.24)
GFR calc Af Amer: 13 mL/min — ABNORMAL LOW (ref >60)
GFR, EST NON AFRICAN AMERICAN: 11 mL/min — AB (ref >60)
GLUCOSE: 43 mg/dL — AB (ref 65–99)
Potassium: 3.5 mmol/L (ref 3.5–5.1)
Sodium: 136 mmol/L (ref 135–145)
TOTAL PROTEIN: 7.5 g/dL (ref 6.5–8.1)

## 2015-12-24 LAB — URINE CULTURE: Culture: NO GROWTH

## 2015-12-24 LAB — CBC
HCT: 39.7 % (ref 39.0–52.0)
Hemoglobin: 12.1 g/dL — ABNORMAL LOW (ref 13.0–17.0)
MCH: 28.5 pg (ref 26.0–34.0)
MCHC: 30.5 g/dL (ref 30.0–36.0)
MCV: 93.6 fL (ref 78.0–100.0)
Platelets: 147 10*3/uL — ABNORMAL LOW (ref 150–400)
RBC: 4.24 MIL/uL (ref 4.22–5.81)
RDW: 16.5 % — AB (ref 11.5–15.5)
WBC: 6.2 10*3/uL (ref 4.0–10.5)

## 2015-12-24 LAB — HEPATITIS B SURFACE ANTIGEN: Hepatitis B Surface Ag: NEGATIVE

## 2015-12-24 LAB — MAGNESIUM: Magnesium: 2.2 mg/dL (ref 1.7–2.4)

## 2015-12-24 MED ORDER — POLYVINYL ALCOHOL 1.4 % OP SOLN
1.0000 [drp] | OPHTHALMIC | Status: DC | PRN
  Administered 2015-12-24 – 2015-12-31 (×2): 1 [drp] via OPHTHALMIC
  Filled 2015-12-24: qty 15

## 2015-12-24 MED ORDER — LORAZEPAM 2 MG/ML IJ SOLN
2.0000 mg | Freq: Once | INTRAMUSCULAR | Status: AC
  Administered 2015-12-24: 2 mg via INTRAVENOUS
  Filled 2015-12-24: qty 1

## 2015-12-24 MED ORDER — DEXTROSE 50 % IV SOLN
INTRAVENOUS | Status: AC
  Administered 2015-12-24: 25 mL
  Filled 2015-12-24: qty 50

## 2015-12-24 MED ORDER — DEXTROSE 50 % IV SOLN
1.0000 | Freq: Once | INTRAVENOUS | Status: AC
  Administered 2015-12-24: 50 mL via INTRAVENOUS

## 2015-12-24 MED ORDER — DEXTROSE 50 % IV SOLN
INTRAVENOUS | Status: AC
  Filled 2015-12-24: qty 50

## 2015-12-24 NOTE — Progress Notes (Signed)
Hypoglycemic Event  CBG: 48  Treatment: D50 IV 25 mL  Symptoms: None  Follow-up CBG: Time:2034 CBG Result:131  Possible Reasons for Event: Inadequate meal intake   [name], Sherlon Handing

## 2015-12-24 NOTE — Progress Notes (Signed)
EEG completed, results pending. 

## 2015-12-24 NOTE — Progress Notes (Signed)
QTC prolonged 0.572, notified from central, Dr. Darrick Meigs made aware. Magnesium lab ordered.

## 2015-12-24 NOTE — Procedures (Signed)
ELECTROENCEPHALOGRAM REPORT  Date of Study: 12/24/2015  Patient's Name: [name] MRN: [id number] Date of Birth: 1955/10/24  Referring Provider: Ebony Hail L. Lissa Merlin, NP  Clinical History: 60 year old man with history of seizures and polysubstance abuse presents for recurrent altered mental status  Medications: Hospital Medications  0.9 % sodium chloride infusion   0.9 % sodium chloride infusion   alteplase (CATHFLO ACTIVASE) injection 2 mg   cefTRIAXone (ROCEPHIN) 1 g in dextrose 5 % 50 mL IVPB   heparin injection 1,000 Units   heparin injection 5,000 Units   hydrALAZINE (APRESOLINE) injection 10 mg   levETIRAcetam (KEPPRA) 500 mg in sodium chloride 0.9 % 100 mL IVPB   lidocaine (PF) (XYLOCAINE) 1 % injection 5 mL   lidocaine-prilocaine (EMLA) cream 1 application   LORazepam (ATIVAN) injection 2 mg   naloxone (NARCAN) injection 0.4 mg   pentafluoroprop-tetrafluoroeth (GEBAUERS) aerosol 1 application   polyvinyl alcohol (LIQUIFILM TEARS) 1.4 % ophthalmic solution 1 drop   sodium chloride flush (NS) 0.9 % injection 3 mL  Technical Summary: A multichannel digital EEG recording measured by the international 10-20 system with electrodes applied with paste and impedances below 5000 ohms performed in our laboratory with EKG monitoring in an awake patient.  Hyperventilation and photic stimulation were not performed.  The digital EEG was referentially recorded, reformatted, and digitally filtered in a variety of bipolar and referential montages for optimal display.    Description: The patient is awake during the recording.  During maximal wakefulness, there is suppression of the background with no discernible posterior dominant rhythm, but it is symmetric, reactive and without any slowing. Patient did exhibit some tremulousness during the recording without electrographic correlate.  There were no epileptiform discharges or electrographic seizures seen.    EKG lead was  unremarkable.  Impression: This awake EEG is normal.    Clinical Correlation: A normal EEG does not exclude a clinical diagnosis of epilepsy.  If further clinical questions remain, prolonged EEG may be helpful.  Clinical correlation is advised.   Metta Clines, DO

## 2015-12-24 NOTE — Progress Notes (Signed)
[facility] Progress Note   Dialysis Orders: [place] TTS 4h 2K/2.25 Ca UF Prof 2 EDW 56kg  LAVF No Heparin  Hectorol 7 mcg IV q treatment  Venofer 100 mg IV q week  Mircera 150 mcg IV q 2 weeks (last dose 12/13/15)  OP Labs: Hgb 11.2 Tsat 54% K 4.7 Corr Ca 10.3 iPTH 73  Assessment/Plan: 1. Altered mental status - recurrent  w/u per primary -MRI/EEG pending h/o of seizure and polysubstance abuse, but tox screen negative for opiates/ cocaine, etc.  Would dc/ avoid neurontin as this was most likely culprit to cause AMS/ tremors/ myoclonus in ESRD pts.  Serum Ca better today, this may have contributed but less likely. 2. ESRD -  TTS HD on schedule  3. Anemia - Hgb 12.1 - on outpatient ESA - 4. Secondary hyperparathyroidism - Corr Ca 10.34 - Ca acetate/Hectorol held for hypercalcemia  5. HTN - BP stable on losartan/labetalol/hydralazine as outpatient - holding BP meds here while NPO 6. Volume - 8kg below EDW Net UF 2.3L - no gross edema on exam - lower OP EDW  6. Nutrition - Alb 3.2 NPO at present   Lynnda Child PA-C [place] Pager [phone] 12/24/2015,10:08 AM  LOS: 1 day   Pt seen, examined, agree w assess/plan as above with additions as indicated.  Kelly Splinter MD [facility] pager [phone]    cell [phone] 12/24/2015, 1:56 PM     Subjective:  Speech more intelligible today - Says he's tired "yawning" all night   Objective Vitals:   12/23/15 2137 12/24/15 0455 12/24/15 0500 12/24/15 0940  BP: (!) 168/68 (!) 141/67  135/72  Pulse: 69 70  67  Resp: 17 18  18   Temp: 98.3 F (36.8 C) 98.4 F (36.9 C)  97.9 F (36.6 C)  TempSrc:    Oral  SpO2: 100% 98%  98%  Weight: 48.1 kg (106 lb)  48.1 kg (106 lb 0.7 oz)   Height:       Physical Exam General: Frail chronically ill appearing elderly male NAD Heart: RRR with S1 S2.  Lungs: CTA bilaterally without wheezes, rales, or rhonchi. Breathing is unlabored. Abdomen: soft NT +  BS MS: Arms flexed at elbows with with tremors - left arm restrained on HD Extremities: Arms flexed at elbows with with tremors with fine myoclonic tremors - R BKA without erythema or edema Dialysis Access: LUF AVF +thrill   Additional Objective Labs: Basic Metabolic Panel:  Recent Labs Lab 12/23/15 0814 12/24/15 0511  NA 139 136  K 4.2 3.5  CL 96* 93*  CO2 30 24  GLUCOSE 84 43*  BUN 31* 18  CREATININE 7.13* 5.03*  CALCIUM 11.9* 9.7   Liver Function Tests:  Recent Labs Lab 12/23/15 0814 12/24/15 0511  AST 26 25  ALT 14* 12*  ALKPHOS 70 64  BILITOT 0.8 0.9  PROT 7.4 7.5  ALBUMIN 3.2* 3.2*   No results for input(s): LIPASE, AMYLASE in the last 168 hours. CBC:  Recent Labs Lab 12/23/15 0814 12/24/15 0511  WBC 5.5 6.2  NEUTROABS 4.0  --   HGB 12.4* 12.1*  HCT 41.2 39.7  MCV 93.8 93.6  PLT 190 147*   Blood Culture    Component Value Date/Time   SDES URINE, CATHETERIZED 12/23/2015 1057   SPECREQUEST NONE 12/23/2015 1057   CULT NO GROWTH 12/23/2015 1057   REPTSTATUS 12/24/2015 FINAL 12/23/2015 1057    Cardiac Enzymes: No results for input(s): CKTOTAL, CKMB, CKMBINDEX, TROPONINI  in the last 168 hours. CBG:  Recent Labs Lab 12/23/15 0717 12/23/15 1448 12/24/15 0619  GLUCAP 81 73 111*   Iron Studies: No results for input(s): IRON, TIBC, TRANSFERRIN, FERRITIN in the last 72 hours. Lab Results  Component Value Date   INR 1.05 12/11/2015   INR 1.23 12/03/2015   INR 0.96 08/16/2015   Medications:   . cefTRIAXone (ROCEPHIN)  IV  1 g Intravenous Q24H  . heparin  5,000 Units Subcutaneous Q8H  . levETIRAcetam  500 mg Intravenous Q12H  . sodium chloride flush  3 mL Intravenous Q12H

## 2015-12-24 NOTE — Progress Notes (Signed)
Cbg, 44, Dr. Darrick Meigs made aware. Putting in orders now

## 2015-12-24 NOTE — Progress Notes (Addendum)
Hypoglycemic Event  CBG: 43  Treatment: 1/2 amp of dextrose  Symptoms: non symptomatic  Follow-up CBG: Time: 0619 CBG Result: 111  Possible Reasons for Event: NPO  Comments/MD notified: [facility] hospitalist    Gwenevere Ghazi

## 2015-12-24 NOTE — Evaluation (Signed)
Clinical/Bedside Swallow Evaluation Patient Details  Name: [name] MRN: [id number] Date of Birth: 08-02-1955  Today's Date: 12/24/2015 Time: SLP Start Time (ACUTE ONLY): [id number] SLP Stop Time (ACUTE ONLY): 1459 SLP Time Calculation (min) (ACUTE ONLY): 12 min  Past Medical History:  Past Medical History:  Diagnosis Date  . Anemia, chronic disease    a. Capsule endoscopy reportedly negative 08/2013. b. seen by heme with concern for minor B cell population on BMB, being observed.  . Arthritis    "right shoulder" (11/09/2012)  . Chronic diastolic CHF (congestive heart failure) ([place])    a. EF 60 - 65% per [place] echo 11/2011. b. Echo 02/2012: severe LVH, focal basal hypertrophy, EF 60-65%, mild Mr.  . Cocaine abuse    mentioned in notes from [place]  . ESRD (end stage renal disease) on dialysis [facility]    "TTS; [address]" (06/19/2015)  . ESRD on hemodialysis ([place]) since 2012   a. Since 2012. ESRD was due to "drugs", primarily used cocaine.  Has 3-5 year hx of HTN, no DM.  Gets HD on TTS schedule at [facility]. Originally from [place].   . GI bleed 05/04/2012  . Head injury, closed, with concussion ([place])   . Headache(784.0)   . Helicobacter pylori gastritis    not defined if this was treated  . Hematochezia   . Hepatitis B core antibody positive    03/01/10  . Hepatitis C antibody test positive    was HIV negative, 02/28/12  . History of blood transfusion   . Hypercalcemia   . Hyperpotassemia   . Hypertension   . Hypertensive heart disease   . Optic neuritis, left   . Polyp of colon, adenomatous    May 2012.  Dr Trenton Founds in [place]  . Positive QuantiFERON-TB Gold test    11/2011  . Protein-calorie malnutrition, severe ([place])   . Seizure disorder [facility])    questionable history of - will need to clarify with PCP  . Tobacco abuse    Past Surgical History:  Past Surgical History:  Procedure Laterality Date  . [place] TRANSPOSITION  03/07/2012   Procedure:  BASCILIC VEIN TRANSPOSITION;  Surgeon: Conrad Sonoma, MD;  Location: [place];  Service: Vascular;  Laterality: Left;  First Stage  . BASCILIC VEIN TRANSPOSITION Left 05/31/2012   Procedure: BASCILIC VEIN TRANSPOSITION;  Surgeon: Conrad Atoka, MD;  Location: [place];  Service: Vascular;  Laterality: Left;  Left 2nd Stage Basilic Vein Transposition with gortex graft revision using 1mmx10cm graft  . [name] CAPSULE STUDY N/A 08/27/2013   Procedure: [name] CAPSULE STUDY;  Surgeon: Milus Banister, MD;  Location: [place];  Service: Endoscopy;  Laterality: N/A;  . INSERTION OF DIALYSIS CATHETER     right chest  . JOINT REPLACEMENT    . SHOULDER OPEN ROTATOR CUFF REPAIR Right   . TOTAL KNEE ARTHROPLASTY Left    HPI:  60 year old SNF resident with history of seizures and polysubstance abuse presents for recurrent altered mental status.  Dx uncontrolled HTN, seizure disorder,  abnormal urinalysis, ESRD, protein cal malnutrition.    Assessment / Plan / Recommendation Clinical Impression  Pt presents with MS changes, confusion, but was able to focus attention sufficiently to consume purees and thin liquids.  Required mod verbal/tactile cues and hand-over-hand assist for self-feeding.  There was notable oral holding, but once swallow was initiated, there were no overt s/s of aspiration.  Recommend initiating a full liquid diet for now; hold tray  if pt not alert.  SLP will follow for safety/diet progression.  D/W RN.     Aspiration Risk  Mild aspiration risk    Diet Recommendation     Medication Administration: Whole meds with puree    Other  Recommendations Oral Care Recommendations: Oral care BID   Follow up Recommendations  None    Frequency and Duration min 2x/week  1 week       Prognosis Prognosis for Safe Diet Advancement: Good      Swallow Study   General Date of Onset: 12/23/15 HPI: 60 year old SNF resident with history of seizures and polysubstance abuse presents for recurrent  altered mental status.  Dx uncontrolled HTN, seizure disorder,  abnormal urinalysis, ESRD, protein cal malnutrition.  Type of Study: Bedside Swallow Evaluation Previous Swallow Assessment: no Diet Prior to this Study: NPO Temperature Spikes Noted: No Respiratory Status: Room air History of Recent Intubation: No Behavior/Cognition: Confused;Lethargic/Drowsy Oral Cavity Assessment: Within Functional Limits Oral Care Completed by SLP: No Vision: Functional for self-feeding Self-Feeding Abilities: Needs assist Patient Positioning: Upright in bed Baseline Vocal Quality: Normal Volitional Cough: Cognitively unable to elicit Volitional Swallow: Unable to elicit    Oral/Motor/Sensory Function Overall Oral Motor/Sensory Function: Within functional limits   Ice Chips Ice chips: Within functional limits   Thin Liquid Thin Liquid: Impaired Presentation: Cup;Straw Oral Phase Functional Implications: Prolonged oral transit;Oral holding    Nectar Thick Nectar Thick Liquid: Not tested   Honey Thick Honey Thick Liquid: Not tested   Puree Puree: Impaired Presentation: Spoon Oral Phase Functional Implications: Oral holding   Solid   GO   Solid: Not tested        [name] 12/24/2015,3:06 PM

## 2015-12-24 NOTE — Progress Notes (Signed)
[facility]  PROGRESS NOTE  [name] [id number] DOB: 09-08-1955 DOA: 12/23/2015 PCP: No PCP Per Patient    Brief HPI:  60 y.o. male with medical history significant for chronic kidney disease on dialysis, known seizure disorder on Keppra, hypertension with LVH, anemia of chronic kidney disease, dyslipidemia, prolonged QT interval on EKG which is chronic, history of polysubstance abuse with prior admissions for alcohol withdrawal, history of cocaine use, history of hepatitis C anybody positive and prior hepatitis B infection, and acute GI bleeding during previous admission August of this year. 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 He was recently discharged on 8/25. He had been admitted due to altered mentation presumed related to polypharmacy.  patient was sent to the ER from dialysis after they noticed altered mentation. He was attempting to verbalize but responses were unintelligible and very soft. He was unable to follow commands but was spontaneously moving all of his extremities. He last completed dialysis on Saturday. In the ER his initial rectal temperature was 95.5, he was hypertensive, not hypoxic but in setting of altered mentation and known dialysis patient at risk for bacterial infections ER treated him as sepsis protocol. He has remained hypertensive and altered with normal lactic acid, no leukocytosis or elevation in neutrophils and no definitive source although blood cultures collected and pending. His other labs are at baseline for him given need for dialysis which is due today. This felt at this juncture patient did not have sepsis etiology to his altered mentation and other etiologies are now being pursued.    Assessment/Plan:    1. Acute encephalopathy- unclear cause, EEG showed no epileptiform discharges. MRI could not be performed earlier, will again attempt MRI after giving 2 mg IV Ativan. Neurontin has been discontinued by nephrology. Will  follow the results of MRI. Swallow evaluation done patient started on full liquid diet 2. Seizure disorder- EEG negative for any epileptiform discharges, continue Keppra 500 mg twice a day  3. ? UTI- patient was started on empiric  ceftriaxone, urine culture shows no growth. Discontinue ceftriaxone at this time. 4. ESRD on hemodialysis-patient on Tuesday Thursday Saturday hemodialysis. Nephrology following 5. Prolonged QT interval- chronic, will check serum magnesium. Avoid Haldol Zofran and fluoroquinolones.    DVT prophylaxis: heparin Code Status: Full code Family Communication: No family at bedside Disposition Plan: Pending improvement in mental status   Consultants:   Nephrology  Procedures:  EEG  Antibiotics:  Ceftriaxone  Subjective   Patient seen and examined, Mental status is slowly improving. MRI could not be done on this patient was not ablee to lay still.  Objective    Objective: Vitals:   12/23/15 2137 12/24/15 0455 12/24/15 0500 12/24/15 0940  BP: (!) 168/68 (!) 141/67  135/72  Pulse: 69 70  67  Resp: 17 18  18   Temp: 98.3 F (36.8 C) 98.4 F (36.9 C)  97.9 F (36.6 C)  TempSrc:    Oral  SpO2: 100% 98%  98%  Weight: 48.1 kg (106 lb)  48.1 kg (106 lb 0.7 oz)   Height:        Intake/Output Summary (Last 24 hours) at 12/24/15 1617 Last data filed at 12/24/15 0951  Gross per 24 hour  Intake              260 ml  Output             2314 ml  Net            -  2054 ml   Filed Weights   12/23/15 1335 12/23/15 2137 12/24/15 0500  Weight: 59 kg (130 lb 1.1 oz) 48.1 kg (106 lb) 48.1 kg (106 lb 0.7 oz)    Examination:  General exam: Appears calm and comfortable  Respiratory system: Clear to auscultation. Respiratory effort normal. Cardiovascular system: S1 & S2 heard, RRR. No JVD, murmurs, rubs, gallops or clicks. No pedal edema. Gastrointestinal system: Abdomen is nondistended, soft and nontender. No organomegaly or masses felt. Normal bowel sounds  heard. Central nervous system: Alert and oriented x 2. No focal neurological deficits. Extremities: Symmetric 5 x 5 power. Skin: No rashes, lesions or ulcers Psychiatry: Judgement and insight appear normal. Mood & affect appropriate.    Data Reviewed: I have personally reviewed following labs and imaging studies Basic Metabolic Panel:  Recent Labs Lab 12/23/15 0814 12/24/15 0511  NA 139 136  K 4.2 3.5  CL 96* 93*  CO2 30 24  GLUCOSE 84 43*  BUN 31* 18  CREATININE 7.13* 5.03*  CALCIUM 11.9* 9.7  MG  --  2.2   Liver Function Tests:  Recent Labs Lab 12/23/15 0814 12/24/15 0511  AST 26 25  ALT 14* 12*  ALKPHOS 70 64  BILITOT 0.8 0.9  PROT 7.4 7.5  ALBUMIN 3.2* 3.2*   No results for input(s): LIPASE, AMYLASE in the last 168 hours. No results for input(s): AMMONIA in the last 168 hours. CBC:  Recent Labs Lab 12/23/15 0814 12/24/15 0511  WBC 5.5 6.2  NEUTROABS 4.0  --   HGB 12.4* 12.1*  HCT 41.2 39.7  MCV 93.8 93.6  PLT 190 147*   Cardiac Enzymes: No results for input(s): CKTOTAL, CKMB, CKMBINDEX, TROPONINI in the last 168 hours. BNP (last 3 results)  Recent Labs  08/16/15 2256  BNP 157.1*    ProBNP (last 3 results) No results for input(s): PROBNP in the last 8760 hours.  CBG:  Recent Labs Lab 12/23/15 1448 12/24/15 0619 12/24/15 1153 12/24/15 1235 12/24/15 1605  GLUCAP 73 111* 56* 105* 79    Recent Results (from the past 240 hour(s))  Blood Culture (routine x 2)     Status: None (Preliminary result)   Collection Time: 12/23/15  8:16 AM  Result Value Ref Range Status   Specimen Description BLOOD RIGHT FOREARM  Final   Special Requests BOTTLES DRAWN AEROBIC AND ANAEROBIC 10CC  Final   Culture NO GROWTH 1 DAY  Final   Report Status PENDING  Incomplete  Blood Culture (routine x 2)     Status: None (Preliminary result)   Collection Time: 12/23/15  9:43 AM  Result Value Ref Range Status   Specimen Description BLOOD RIGHT HAND  Final    Special Requests BOTTLES DRAWN AEROBIC AND ANAEROBIC 5CC  Final   Culture NO GROWTH 1 DAY  Final   Report Status PENDING  Incomplete  Urine culture     Status: None   Collection Time: 12/23/15 10:57 AM  Result Value Ref Range Status   Specimen Description URINE, CATHETERIZED  Final   Special Requests NONE  Final   Culture NO GROWTH  Final   Report Status 12/24/2015 FINAL  Final     Studies: Dg Chest Port 1 View  Result Date: 12/23/2015 CLINICAL DATA:  Sepsis, unable to communicate, history of chronic CHF, hepatitis-B and [place], positive PPD, dialysis dependent renal failure. EXAM: PORTABLE CHEST 1 VIEW COMPARISON:  Portable chest x-ray of December 12, 2015 FINDINGS: The lungs are slightly less well inflated today. The  interstitial markings are coarse in the perihilar regions and left mid lung. The cardiac silhouette is top-normal in size. The pulmonary vascularity is normal. There is calcification in the wall of the aortic arch. The trachea is midline. The bony thorax exhibits no acute abnormality. There degenerative changes of both shoulders. IMPRESSION: Mild perihilar interstitial prominence today may reflect low-grade interstitial edema or early interstitial pneumonia. Aortic atherosclerosis. Electronically Signed   By: David  Martinique M.D.   On: 12/23/2015 08:10    Scheduled Meds: . cefTRIAXone (ROCEPHIN)  IV  1 g Intravenous Q24H  . heparin  5,000 Units Subcutaneous Q8H  . levETIRAcetam  500 mg Intravenous Q12H  . LORazepam  2 mg Intravenous Once  . sodium chloride flush  3 mL Intravenous Q12H   Continuous Infusions:      Time spent: 25 min    [facility] Pager [phone]. If 7PM-7AM, please contact night-coverage at [email or web address], Office  [phone]  password [email or web address] 12/24/2015, 4:17 PM  LOS: 1 day

## 2015-12-25 LAB — CBC
HCT: 38 % — ABNORMAL LOW (ref 39.0–52.0)
HEMOGLOBIN: 11.6 g/dL — AB (ref 13.0–17.0)
MCH: 28.3 pg (ref 26.0–34.0)
MCHC: 30.5 g/dL (ref 30.0–36.0)
MCV: 92.7 fL (ref 78.0–100.0)
Platelets: 158 10*3/uL (ref 150–400)
RBC: 4.1 MIL/uL — ABNORMAL LOW (ref 4.22–5.81)
RDW: 16.5 % — ABNORMAL HIGH (ref 11.5–15.5)
WBC: 5.8 10*3/uL (ref 4.0–10.5)

## 2015-12-25 LAB — RENAL FUNCTION PANEL
ALBUMIN: 3 g/dL — AB (ref 3.5–5.0)
ANION GAP: 13 (ref 5–15)
BUN: 26 mg/dL — AB (ref 6–20)
CALCIUM: 10.6 mg/dL — AB (ref 8.9–10.3)
CO2: 27 mmol/L (ref 22–32)
Chloride: 101 mmol/L (ref 101–111)
Creatinine, Ser: 6.67 mg/dL — ABNORMAL HIGH (ref 0.61–1.24)
GFR calc Af Amer: 9 mL/min — ABNORMAL LOW (ref >60)
GFR, EST NON AFRICAN AMERICAN: 8 mL/min — AB (ref >60)
GLUCOSE: 63 mg/dL — AB (ref 65–99)
PHOSPHORUS: 6.6 mg/dL — AB (ref 2.5–4.6)
Potassium: 3.7 mmol/L (ref 3.5–5.1)
SODIUM: 141 mmol/L (ref 135–145)

## 2015-12-25 LAB — DRUG SCREEN 10 W/CONF, SERUM
Amphetamines, IA: NEGATIVE ng/mL
BENZODIAZEPINES, IA: NEGATIVE ng/mL
Barbiturates, IA: NEGATIVE ug/mL
COCAINE & METABOLITE, IA: NEGATIVE ng/mL
Methadone, IA: NEGATIVE ng/mL
OPIATES, IA: NEGATIVE ng/mL
Oxycodones, IA: NEGATIVE ng/mL
Phencyclidine, IA: NEGATIVE ng/mL
Propoxyphene, IA: NEGATIVE ng/mL
THC(MARIJUANA) METABOLITE, IA: NEGATIVE ng/mL

## 2015-12-25 LAB — GLUCOSE, CAPILLARY
GLUCOSE-CAPILLARY: 169 mg/dL — AB (ref 65–99)
GLUCOSE-CAPILLARY: 109 mg/dL — AB (ref 65–99)
Glucose-Capillary: 66 mg/dL (ref 65–99)
Glucose-Capillary: 81 mg/dL (ref 65–99)
Glucose-Capillary: 106 mg/dL — ABNORMAL HIGH (ref 65–99)
Glucose-Capillary: 90 mg/dL (ref 65–99)
Glucose-Capillary: 88 mg/dL (ref 65–99)

## 2015-12-25 LAB — LEVETIRACETAM LEVEL: LEVETIRACETAM: 37.9 ug/mL (ref 10.0–40.0)

## 2015-12-25 MED ORDER — LIDOCAINE-PRILOCAINE 2.5-2.5 % EX CREA: 1.0000 "application " | TOPICAL_CREAM | CUTANEOUS | Status: DC | PRN

## 2015-12-25 MED ORDER — SODIUM CHLORIDE 0.9 % IV BOLUS (SEPSIS): 1500.0000 mL | Freq: Once | INTRAVENOUS | Status: DC

## 2015-12-25 MED ORDER — PRO-STAT SUGAR FREE PO LIQD
30.0000 mL | Freq: Two times a day (BID) | ORAL | Status: DC
  Administered 2016-01-01 (×2): 30 mL via ORAL
  Filled 2015-12-25 (×2): qty 30

## 2015-12-25 MED ORDER — ALTEPLASE 2 MG IJ SOLR: 2.0000 mg | Freq: Once | INTRAMUSCULAR | Status: DC | PRN

## 2015-12-25 MED ORDER — SODIUM CHLORIDE 0.9 % IV SOLN: 100.0000 mL | INTRAVENOUS | Status: DC | PRN

## 2015-12-25 MED ORDER — LIDOCAINE HCL (PF) 1 % IJ SOLN
5.0000 mL | INTRAMUSCULAR | Status: DC | PRN
Start: 2015-12-25 — End: 2015-12-25

## 2015-12-25 MED ORDER — HEPARIN SODIUM (PORCINE) 1000 UNIT/ML DIALYSIS: 1000.0000 [IU] | INTRAMUSCULAR | Status: DC | PRN

## 2015-12-25 MED ORDER — BOOST / RESOURCE BREEZE PO LIQD
1.0000 | Freq: Two times a day (BID) | ORAL | Status: DC
  Administered 2015-12-26 – 2016-01-01 (×2): 1 via ORAL

## 2015-12-25 MED ORDER — PENTAFLUOROPROP-TETRAFLUOROETH EX AERO: 1.0000 "application " | INHALATION_SPRAY | CUTANEOUS | Status: DC | PRN

## 2015-12-25 NOTE — Progress Notes (Signed)
[facility]  PROGRESS NOTE  [name] [id number] DOB: [birth year] DOA: 12/23/2015 PCP: No PCP Per Patient    Brief HPI:  60 y.o. male with medical history significant for chronic kidney disease on dialysis, known seizure disorder on Keppra, hypertension with LVH, anemia of chronic kidney disease, dyslipidemia, prolonged QT interval on EKG which is chronic, history of polysubstance abuse with prior admissions for alcohol withdrawal, history of cocaine use, history of hepatitis C anybody positive and prior hepatitis B infection, and acute GI bleeding during previous admission August of this year. 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 He was recently discharged on 8/25. He had been admitted due to altered mentation presumed related to polypharmacy.  patient was sent to the ER from dialysis after they noticed altered mentation. He was attempting to verbalize but responses were unintelligible and very soft. He was unable to follow commands but was spontaneously moving all of his extremities. He last completed dialysis on Saturday. In the ER his initial rectal temperature was 95.5, he was hypertensive, not hypoxic but in setting of altered mentation and known dialysis patient at risk for bacterial infections ER treated him as sepsis protocol. He has remained hypertensive and altered with normal lactic acid, no leukocytosis or elevation in neutrophils and no definitive source although blood cultures collected and pending. His other labs are at baseline for him given need for dialysis which is due today. This felt at this juncture patient did not have sepsis etiology to his altered mentation and other etiologies are now being pursued.    Assessment/Plan:    1. Acute encephalopathy- unclear cause, EEG showed no epileptiform discharges. MRI could not be performed earlier, will again attempt MRI after giving 2 mg IV Ativan. Neurontin has been discontinued by nephrology. Will  follow the results of MRI. Swallow evaluation done patient started on full liquid diet 2. Seizure disorder- EEG negative for any epileptiform discharges, continue Keppra 500 mg twice a day  3. ? UTI- patient was started on empiric  ceftriaxone, urine culture shows no growth. Discontinue ceftriaxone at this time. 4. ESRD on hemodialysis-patient on Tuesday Thursday Saturday hemodialysis. Nephrology following. Dry weight down 6 kg, patient to be given IV fluids during HD. 5. Prolonged QT interval- chronic,  checked serum magnesium 2.2. Avoid Haldol Zofran and fluoroquinolones.    DVT prophylaxis: heparin Code Status: Full code Family Communication: No family at bedside Disposition Plan: Pending improvement in mental status   Consultants:   Nephrology  Procedures:  EEG  Antibiotics:  Ceftriaxone  Subjective   Patient seen and examined, Mental status is slowly improving. MRI showed no acute abnormality.  Objective    Objective: Vitals:   12/25/15 1030 12/25/15 1100 12/25/15 1130 12/25/15 1200  BP: (!) 154/85 (!) 168/105 (!) 152/71 (!) 153/73  Pulse: 69 69 68 70  Resp: 16 15 16 18   Temp:      TempSrc:      SpO2: 100% 100% 100% 100%  Weight:      Height:        Intake/Output Summary (Last 24 hours) at 12/25/15 1217 Last data filed at 12/25/15 0900  Gross per 24 hour  Intake              155 ml  Output                0 ml  Net              155 ml   Filed  Weights   12/23/15 2137 12/24/15 0500 12/25/15 0835  Weight: 48.1 kg (106 lb) 48.1 kg (106 lb 0.7 oz) 50.3 kg (110 lb 14.3 oz)    Examination:  General exam: Appears calm and comfortable  Respiratory system: Clear to auscultation. Respiratory effort normal. Cardiovascular system: S1 & S2 heard, RRR. No JVD, murmurs, rubs, gallops or clicks. No pedal edema. Gastrointestinal system: Abdomen is nondistended, soft and nontender. No organomegaly or masses felt. Normal bowel sounds heard. Central nervous system:  Alert and oriented x 2. No focal neurological deficits. Extremities: Symmetric 5 x 5 power. Skin: No rashes, lesions or ulcers Psychiatry: Judgement and insight appear normal. Mood & affect appropriate.    Data Reviewed: I have personally reviewed following labs and imaging studies Basic Metabolic Panel:  Recent Labs Lab 12/23/15 0814 12/24/15 0511 12/25/15 0913  NA 139 136 141  K 4.2 3.5 3.7  CL 96* 93* 101  CO2 30 24 27   GLUCOSE 84 43* 63*  BUN 31* 18 26*  CREATININE 7.13* 5.03* 6.67*  CALCIUM 11.9* 9.7 10.6*  MG  --  2.2  --   PHOS  --   --  6.6*   Liver Function Tests:  Recent Labs Lab 12/23/15 0814 12/24/15 0511 12/25/15 0913  AST 26 25  --   ALT 14* 12*  --   ALKPHOS 70 64  --   BILITOT 0.8 0.9  --   PROT 7.4 7.5  --   ALBUMIN 3.2* 3.2* 3.0*   No results for input(s): LIPASE, AMYLASE in the last 168 hours. No results for input(s): AMMONIA in the last 168 hours. CBC:  Recent Labs Lab 12/23/15 0814 12/24/15 0511 12/25/15 0912  WBC 5.5 6.2 5.8  NEUTROABS 4.0  --   --   HGB 12.4* 12.1* 11.6*  HCT 41.2 39.7 38.0*  MCV 93.8 93.6 92.7  PLT 190 147* 158   Cardiac Enzymes: No results for input(s): CKTOTAL, CKMB, CKMBINDEX, TROPONINI in the last 168 hours. BNP (last 3 results)  Recent Labs  08/16/15 2256  BNP 157.1*    ProBNP (last 3 results) No results for input(s): PROBNP in the last 8760 hours.  CBG:  Recent Labs Lab 12/24/15 2349 12/25/15 0404 12/25/15 0438 12/25/15 0732 12/25/15 1013  GLUCAP 72 66 169* 81 109*    Recent Results (from the past 240 hour(s))  Blood Culture (routine x 2)     Status: None (Preliminary result)   Collection Time: 12/23/15  8:16 AM  Result Value Ref Range Status   Specimen Description BLOOD RIGHT FOREARM  Final   Special Requests BOTTLES DRAWN AEROBIC AND ANAEROBIC 10CC  Final   Culture NO GROWTH 1 DAY  Final   Report Status PENDING  Incomplete  Blood Culture (routine x 2)     Status: None (Preliminary  result)   Collection Time: 12/23/15  9:43 AM  Result Value Ref Range Status   Specimen Description BLOOD RIGHT HAND  Final   Special Requests BOTTLES DRAWN AEROBIC AND ANAEROBIC 5CC  Final   Culture NO GROWTH 1 DAY  Final   Report Status PENDING  Incomplete  Urine culture     Status: None   Collection Time: 12/23/15 10:57 AM  Result Value Ref Range Status   Specimen Description URINE, CATHETERIZED  Final   Special Requests NONE  Final   Culture NO GROWTH  Final   Report Status 12/24/2015 FINAL  Final     Studies: Mr Brain Wo Contrast  Result Date: 12/24/2015  CLINICAL DATA:  Seizure disorder.  Altered mental status. EXAM: MRI HEAD WITHOUT CONTRAST TECHNIQUE: Multiplanar, multiecho pulse sequences of the brain and surrounding structures were obtained without intravenous contrast. COMPARISON:  Head CT 12/10/2015, brain MRI 01/21/2014 FINDINGS: Brain: No acute infarct or intraparenchymal hemorrhage. The midline structures are normal. There is confluent hyperintense T2 weighted signal within the periventricular white matter and brainstem. No mass lesion or midline shift. No hydrocephalus or extra-axial fluid collection. There is advanced atrophy for age. Vascular: Major intracranial flow voids are preserved. No evidence of chronic microhemorrhage or amyloid angiopathy. Skull and upper cervical spine: The visualized skull base, calvarium, upper cervical spine and extracranial soft tissues are normal. Sinuses/Orbits: No fluid levels or advanced mucosal thickening. No mastoid effusion. Normal orbits. IMPRESSION: 1. No acute intracranial abnormality. 2. Findings of advanced chronic microvascular ischemia involving the periventricular white matter and brainstem. This appears unchanged compared to 01/21/2014. 3. Advanced atrophy for age. Electronically Signed   By: Ulyses Jarred M.D.   On: 12/24/2015 23:21    Scheduled Meds: . cefTRIAXone (ROCEPHIN)  IV  1 g Intravenous Q24H  . heparin  5,000 Units  Subcutaneous Q8H  . levETIRAcetam  500 mg Intravenous Q12H  . sodium chloride  1,500 mL Intravenous Once  . sodium chloride flush  3 mL Intravenous Q12H   Continuous Infusions:      Time spent: 25 min    [facility] Pager [phone]. If 7PM-7AM, please contact night-coverage at [email or web address], Office  [phone]  password [email or web address] 12/25/2015, 12:17 PM  LOS: 2 days

## 2015-12-25 NOTE — Progress Notes (Signed)
Initial Nutrition Assessment  DOCUMENTATION CODES:   Severe malnutrition in context of chronic illness, Underweight  INTERVENTION:  Provide Boost Breeze po BID, each supplement provides 250 kcal and 9 grams of protein.  Provide 30 ml Prostat po BID, each supplement provides 100 kcal and 15 grams of protein.   Encourage adequate PO intake.   NUTRITION DIAGNOSIS:   Malnutrition related to chronic illness as evidenced by severe depletion of body fat, severe depletion of muscle mass.  GOAL:   Patient will meet greater than or equal to 90% of their needs  MONITOR:   PO intake, Supplement acceptance, Diet advancement, Labs, Weight trends, Skin, I & O's  REASON FOR ASSESSMENT:   Malnutrition Screening Tool    ASSESSMENT:   60 y.o. male with medical history significant for chronic kidney disease on dialysis, known seizure disorder on Keppra, hypertension with LVH, anemia of chronic kidney disease, dyslipidemia, prolonged QT interval on EKG which is chronic, history of polysubstance abuse with prior admissions for alcohol withdrawal, history of cocaine use, history of hepatitis C anybody positive and prior hepatitis B infection, and acute GI bleeding during previous admission August of this year. 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 He was recently discharged on 8/25. He had been admitted due to altered mentation presumed related to polypharmacy.  Pt reports hunger during time of visit. Pt reports he did not eat breakfast this AM due to dialysis. Diet recently has been changed to a dysphagia 1 diet with thin liquids. Pt reports usually consuming 2-3 meals a day PTA. Pt with weight loss since admission per Epic weight records, however weight loss likely related to fluid status. RD to order nutritional supplements to aid in caloric and protein needs(Boost Breeze and Prostat). Noted pt reports the milkshake supplements such as Nepro or Ensure cause abdominal discomfort.    Nutrition-Focused physical exam completed. Findings are severe fat depletion, severe muscle depletion, and mild edema.   Labs: Phosphorous elevated at 6.6.  Diet Order:  DIET - DYS 1 Room service appropriate? Yes; Fluid consistency: Thin  Skin:   (L knee abrasion)  Last BM:  Unknown  Height:   Ht Readings from Last 1 Encounters:  12/23/15 5\' 10"  (1.778 m)    Weight:   Wt Readings from Last 1 Encounters:  12/25/15 111 lb 15.9 oz (50.8 kg)    Ideal Body Weight:  70.55 kg (Adjusted for R BKA)  BMI:  Body mass index is 16.07 kg/m.  Estimated Nutritional Needs:   Kcal:  1700-1900  Protein:  75-90 grams  Fluid:  Per MD  EDUCATION NEEDS:   No education needs identified at this time  Corrin Parker, MS, RD, LDN Pager # [phone] After hours/ weekend pager # [phone]

## 2015-12-25 NOTE — Progress Notes (Signed)
[facility] Progress Note   Assessment/Plan: 1. Altered mental status - recurrent issue,  w/u per primary -MRI/EEG pending h/o of seizure and polysubstance abuse, but tox screen negative for opiates/ cocaine, etc.  HyperCa++ resolved. Dehydrated by wts and exam, under dry wt by 6kg.  Will give NS bolus w HD today 1-2 L.  2. ESRD -  TTS HD on schedule HD today 3. Anemia - Hgb 12.1 - on outpatient ESA - 4. Secondary hyperparathyroidism - Corr Ca 10.34 - Ca acetate/Hectorol held for hypercalcemia  5. HTN - BP stable on losartan/labetalol/hydralazine as outpatient - holding BP meds here while NPO 6. Volume - 8kg below EDW Net UF 2.3L - no gross edema on exam - lower OP EDW  6. Nutrition - Alb 3.2 NPO at present    [place] pager [phone]    cell [phone] 12/25/2015, 8:55 AM  Dialysis: [facility] TTS 4h 2K/2.25 Ca UF Prof 2 EDW 56kg  LAVF No Heparin  Hectorol 7 mcg IV q treatment  Venofer 100 mg IV q week  Mircera 150 mcg IV q 2 weeks (last dose 12/13/15)  OP Labs: Hgb 11.2 Tsat 54% K 4.7 Corr Ca 10.3 iPTH 73  Subjective:  Remains weak and trembling, responding to some questions  Objective Vitals:   12/24/15 2009 12/24/15 2141 12/24/15 2220 12/25/15 0426  BP: (!) 181/115 (!) 184/68 (!) 143/59 (!) 145/70  Pulse: 71  62 63  Resp: (!) 22   18  Temp: 98.6 F (37 C)   98 F (36.7 C)  TempSrc:      SpO2: 97%   100%  Weight:      Height:       Physical Exam Gen: awake, trembling, whispering voice Heart: RRR with S1 S2.  Lungs: CTA bilaterally without wheezes, rales, or rhonchi. Breathing is unlabored. Abdomen: soft NT + BS MS: Arms flexed at elbows with with tremors - left arm restrained on HD Ext: no edema , no joint effusions, R BKA well healed wound no drainage / erythema/ fluctuance Dialysis Access: LUF AVF +thrill  Neuro: nonfocal, gen weak, as above  Additional Objective Labs: Basic Metabolic Panel:  Recent Labs Lab  12/23/15 0814 12/24/15 0511  NA 139 136  K 4.2 3.5  CL 96* 93*  CO2 30 24  GLUCOSE 84 43*  BUN 31* 18  CREATININE 7.13* 5.03*  CALCIUM 11.9* 9.7   Liver Function Tests:  Recent Labs Lab 12/23/15 0814 12/24/15 0511  AST 26 25  ALT 14* 12*  ALKPHOS 70 64  BILITOT 0.8 0.9  PROT 7.4 7.5  ALBUMIN 3.2* 3.2*   No results for input(s): LIPASE, AMYLASE in the last 168 hours. CBC:  Recent Labs Lab 12/23/15 0814 12/24/15 0511  WBC 5.5 6.2  NEUTROABS 4.0  --   HGB 12.4* 12.1*  HCT 41.2 39.7  MCV 93.8 93.6  PLT 190 147*   Blood Culture    Component Value Date/Time   SDES URINE, CATHETERIZED 12/23/2015 1057   SPECREQUEST NONE 12/23/2015 1057   CULT NO GROWTH 12/23/2015 1057   REPTSTATUS 12/24/2015 FINAL 12/23/2015 1057    Cardiac Enzymes: No results for input(s): CKTOTAL, CKMB, CKMBINDEX, TROPONINI in the last 168 hours. CBG:  Recent Labs Lab 12/24/15 2034 12/24/15 2349 12/25/15 0404 12/25/15 0438 12/25/15 0732  GLUCAP 131* 72 66 169* 81   Iron Studies: No results for input(s): IRON, TIBC, TRANSFERRIN, FERRITIN in the last 72 hours. Lab Results  Component Value  Date   INR 1.05 12/11/2015   INR 1.23 12/03/2015   INR 0.96 08/16/2015   Medications:   . cefTRIAXone (ROCEPHIN)  IV  1 g Intravenous Q24H  . heparin  5,000 Units Subcutaneous Q8H  . levETIRAcetam  500 mg Intravenous Q12H  . sodium chloride flush  3 mL Intravenous Q12H

## 2015-12-26 LAB — GLUCOSE, CAPILLARY
GLUCOSE-CAPILLARY: 79 mg/dL (ref 65–99)
Glucose-Capillary: 80 mg/dL (ref 65–99)
Glucose-Capillary: 82 mg/dL (ref 65–99)
Glucose-Capillary: 86 mg/dL (ref 65–99)
Glucose-Capillary: 88 mg/dL (ref 65–99)
Glucose-Capillary: 88 mg/dL (ref 65–99)
Glucose-Capillary: 90 mg/dL (ref 65–99)

## 2015-12-26 MED ORDER — PROMETHAZINE HCL 25 MG/ML IJ SOLN
12.5000 mg | Freq: Three times a day (TID) | INTRAMUSCULAR | Status: DC | PRN
  Administered 2015-12-27 – 2015-12-31 (×3): 12.5 mg via INTRAVENOUS
  Filled 2015-12-26 (×3): qty 1

## 2015-12-26 MED ORDER — LOSARTAN POTASSIUM 50 MG PO TABS
100.0000 mg | ORAL_TABLET | Freq: Every day | ORAL | Status: DC
  Administered 2015-12-27 – 2015-12-31 (×5): 100 mg via ORAL
  Filled 2015-12-26 (×6): qty 2

## 2015-12-26 MED ORDER — PROMETHAZINE HCL 25 MG/ML IJ SOLN
12.5000 mg | Freq: Once | INTRAMUSCULAR | Status: AC
  Administered 2015-12-26: 12.5 mg via INTRAVENOUS
  Filled 2015-12-26: qty 1

## 2015-12-26 MED ORDER — SEVELAMER CARBONATE 2.4 G PO PACK
2.4000 g | PACK | Freq: Three times a day (TID) | ORAL | Status: DC
  Administered 2015-12-31 – 2016-01-01 (×3): 2.4 g via ORAL
  Filled 2015-12-26 (×3): qty 1

## 2015-12-26 NOTE — Progress Notes (Signed)
[facility]  PROGRESS NOTE  [name] [id number] DOB: 1955-07-20 DOA: 12/23/2015 PCP: No PCP Per Patient    Brief HPI:  60 y.o. male with medical history significant for chronic kidney disease on dialysis, known seizure disorder on Keppra, hypertension with LVH, anemia of chronic kidney disease, dyslipidemia, prolonged QT interval on EKG which is chronic, history of polysubstance abuse with prior admissions for alcohol withdrawal, history of cocaine use, history of hepatitis C anybody positive and prior hepatitis B infection, and acute GI bleeding during previous admission August of this year. 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 He was recently discharged on 8/25. He had been admitted due to altered mentation presumed related to polypharmacy.  patient was sent to the ER from dialysis after they noticed altered mentation. He was attempting to verbalize but responses were unintelligible and very soft. He was unable to follow commands but was spontaneously moving all of his extremities. He last completed dialysis on Saturday. In the ER his initial rectal temperature was 95.5, he was hypertensive, not hypoxic but in setting of altered mentation and known dialysis patient at risk for bacterial infections ER treated him as sepsis protocol. He has remained hypertensive and altered with normal lactic acid, no leukocytosis or elevation in neutrophils and no definitive source although blood cultures collected and pending. His other labs are at baseline for him given need for dialysis which is due today. This felt at this juncture patient did not have sepsis etiology to his altered mentation and other etiologies are now being pursued.    Assessment/Plan:    1. Acute encephalopathy- unclear cause, EEG showed no epileptiform discharges. MRI could not be performed earlier, but  MRI done  after giving 2 mg IV Ativan which showed no significant abnormality. Neurontin has been  discontinued by nephrology. Swallow evaluation done patient started on full liquid diet 2. Seizure disorder- EEG negative for any epileptiform discharges, continue Keppra 500 mg twice a day  3. ? UTI- patient was started on empiric  ceftriaxone, urine culture shows no growth. Discontinue ceftriaxone at this time. 4. ESRD on hemodialysis-patient on Tuesday Thursday Saturday hemodialysis. Nephrology following. Dry weight down 6 kg, patient to be given IV fluids during HD. 5. Prolonged QT interval- chronic,  checked serum magnesium 2.2. Avoid Haldol Zofran and fluoroquinolones. 6. Vomiting- ? Cause, start Phenergan 12.5 mg IV q 8 hr prn.    DVT prophylaxis: heparin Code Status: Full code Family Communication: No family at bedside Disposition Plan: Pending improvement in mental status   Consultants:   Nephrology  Procedures:  EEG  Antibiotics:  Ceftriaxone  Subjective   Patient seen and examined, Mental status is slowly improving. MRI showed no acute abnormality. Started vomiting this morning.  Objective    Objective: Vitals:   12/25/15 1711 12/25/15 2339 12/26/15 0500 12/26/15 0944  BP: (!) 193/67 (!) 180/79 (!) 187/76 (!) 186/66  Pulse: 69 68 70 73  Resp: 18 19 20 18   Temp: 97.8 F (36.6 C) 98.7 F (37.1 C) 98.5 F (36.9 C) 97.8 F (36.6 C)  TempSrc: Oral Oral Oral Oral  SpO2: 98% 100% 99% 99%  Weight:  50.6 kg (111 lb 8.8 oz)    Height:        Intake/Output Summary (Last 24 hours) at 12/26/15 1406 Last data filed at 12/26/15 1348  Gross per 24 hour  Intake              320 ml  Output  0 ml  Net              320 ml   Filed Weights   12/25/15 0835 12/25/15 1215 12/25/15 2339  Weight: 50.3 kg (110 lb 14.3 oz) 50.8 kg (111 lb 15.9 oz) 50.6 kg (111 lb 8.8 oz)    Examination:  General exam: Appears calm and comfortable  Respiratory system: Clear to auscultation. Respiratory effort normal. Cardiovascular system: S1 & S2 heard, RRR. No JVD,  murmurs, rubs, gallops or clicks. No pedal edema. Gastrointestinal system: Abdomen is nondistended, soft and nontender. No organomegaly or masses felt. Normal bowel sounds heard. Central nervous system: Alert and oriented x 2. No focal neurological deficits. Extremities: Symmetric 5 x 5 power. Skin: No rashes, lesions or ulcers Psychiatry: Judgement and insight appear normal. Mood & affect appropriate.    Data Reviewed: I have personally reviewed following labs and imaging studies Basic Metabolic Panel:  Recent Labs Lab 12/23/15 0814 12/24/15 0511 12/25/15 0913  NA 139 136 141  K 4.2 3.5 3.7  CL 96* 93* 101  CO2 30 24 27   GLUCOSE 84 43* 63*  BUN 31* 18 26*  CREATININE 7.13* 5.03* 6.67*  CALCIUM 11.9* 9.7 10.6*  MG  --  2.2  --   PHOS  --   --  6.6*   Liver Function Tests:  Recent Labs Lab 12/23/15 0814 12/24/15 0511 12/25/15 0913  AST 26 25  --   ALT 14* 12*  --   ALKPHOS 70 64  --   BILITOT 0.8 0.9  --   PROT 7.4 7.5  --   ALBUMIN 3.2* 3.2* 3.0*   No results for input(s): LIPASE, AMYLASE in the last 168 hours. No results for input(s): AMMONIA in the last 168 hours. CBC:  Recent Labs Lab 12/23/15 0814 12/24/15 0511 12/25/15 0912  WBC 5.5 6.2 5.8  NEUTROABS 4.0  --   --   HGB 12.4* 12.1* 11.6*  HCT 41.2 39.7 38.0*  MCV 93.8 93.6 92.7  PLT 190 147* 158   Cardiac Enzymes: No results for input(s): CKTOTAL, CKMB, CKMBINDEX, TROPONINI in the last 168 hours. BNP (last 3 results)  Recent Labs  08/16/15 2256  BNP 157.1*    ProBNP (last 3 results) No results for input(s): PROBNP in the last 8760 hours.  CBG:  Recent Labs Lab 12/25/15 2112 12/26/15 0002 12/26/15 0457 12/26/15 0737 12/26/15 1141  GLUCAP 88 90 80 79 88    Recent Results (from the past 240 hour(s))  Blood Culture (routine x 2)     Status: None (Preliminary result)   Collection Time: 12/23/15  8:16 AM  Result Value Ref Range Status   Specimen Description BLOOD RIGHT FOREARM   Final   Special Requests BOTTLES DRAWN AEROBIC AND ANAEROBIC 10CC  Final   Culture NO GROWTH 3 DAYS  Final   Report Status PENDING  Incomplete  Blood Culture (routine x 2)     Status: None (Preliminary result)   Collection Time: 12/23/15  9:43 AM  Result Value Ref Range Status   Specimen Description BLOOD RIGHT HAND  Final   Special Requests BOTTLES DRAWN AEROBIC AND ANAEROBIC 5CC  Final   Culture NO GROWTH 3 DAYS  Final   Report Status PENDING  Incomplete  Urine culture     Status: None   Collection Time: 12/23/15 10:57 AM  Result Value Ref Range Status   Specimen Description URINE, CATHETERIZED  Final   Special Requests NONE  Final   Culture NO  GROWTH  Final   Report Status 12/24/2015 FINAL  Final     Studies: Mr Brain Wo Contrast  Result Date: 12/24/2015 CLINICAL DATA:  Seizure disorder.  Altered mental status. EXAM: MRI HEAD WITHOUT CONTRAST TECHNIQUE: Multiplanar, multiecho pulse sequences of the brain and surrounding structures were obtained without intravenous contrast. COMPARISON:  Head CT 12/10/2015, brain MRI 01/21/2014 FINDINGS: Brain: No acute infarct or intraparenchymal hemorrhage. The midline structures are normal. There is confluent hyperintense T2 weighted signal within the periventricular white matter and brainstem. No mass lesion or midline shift. No hydrocephalus or extra-axial fluid collection. There is advanced atrophy for age. Vascular: Major intracranial flow voids are preserved. No evidence of chronic microhemorrhage or amyloid angiopathy. Skull and upper cervical spine: The visualized skull base, calvarium, upper cervical spine and extracranial soft tissues are normal. Sinuses/Orbits: No fluid levels or advanced mucosal thickening. No mastoid effusion. Normal orbits. IMPRESSION: 1. No acute intracranial abnormality. 2. Findings of advanced chronic microvascular ischemia involving the periventricular white matter and brainstem. This appears unchanged compared to  01/21/2014. 3. Advanced atrophy for age. Electronically Signed   By: Ulyses Jarred M.D.   On: 12/24/2015 23:21    Scheduled Meds: . cefTRIAXone (ROCEPHIN)  IV  1 g Intravenous Q24H  . feeding supplement  1 Container Oral BID BM  . feeding supplement (PRO-STAT SUGAR FREE 64)  30 mL Oral BID BM  . heparin  5,000 Units Subcutaneous Q8H  . levETIRAcetam  500 mg Intravenous Q12H  . losartan  100 mg Oral QHS  . sevelamer carbonate  2.4 g Oral TID WC  . sodium chloride  1,500 mL Intravenous Once  . sodium chloride flush  3 mL Intravenous Q12H   Continuous Infusions:      Time spent: 25 min    [facility] Pager [phone]. If 7PM-7AM, please contact night-coverage at [email or web address], Office  [phone]  password [email or web address] 12/26/2015, 2:06 PM  LOS: 3 days

## 2015-12-26 NOTE — Progress Notes (Signed)
Late entry  12/25/15 1400  SLP Visit Information  SLP Received On 12/25/15  General Information  Behavior/Cognition Alert;Cooperative  Patient Positioning Upright in bed  Oral care provided N/A  HPI 61 year old SNF resident with history of seizures and polysubstance abuse presents for recurrent altered mental status.  Dx uncontrolled HTN, seizure disorder,  abnormal urinalysis, ESRD, protein cal malnutrition.   Temperature Spikes Noted No  Respiratory Status Room air  Oral Cavity - Dentition Edentulous  Patient observed directly with PO's Yes  Type of PO's observed Dysphagia 1 (puree);Thin liquids  Liquids provided via Cup;Straw  Treatment Provided  Treatment provided Dysphagia  Dysphagia Treatment  Treatment Methods Skilled observation;Upgraded PO texture trial;Patient/caregiver education;Differential diagnosis  Pt seen with puree solids, no oral holding, timely swallow. Still weak and pt refused regular solids though family member states he typically eats without restriction despite missing dentition. Will upgrade to puree and thin and follow for further upgrades as pt improves.   Family/Caregiver Educated family  Type of cueing Verbal  Amount of cueing Minimal  SLP - End of Session  Patient left in bed  Assessment / Recommendations / Plan  Plan Continue with current plan of care  Dysphagia Recommendations  Diet recommendations Dysphagia 1 (puree);Thin liquid  Liquids provided via Cup;Straw  Medication Administration Whole meds with puree  Supervision Staff to assist with self feeding  Postural Changes and/or Swallow Maneuvers Seated upright 90 degrees  Progression Toward Goals  Progression toward goals Progressing toward goals  SLP Time Calculation  SLP Start Time (ACUTE ONLY) 1410  SLP Stop Time (ACUTE ONLY) 1422  SLP Time Calculation (min) (ACUTE ONLY) 12 min  SLP Evaluations  $ SLP Speech Visit 1 Procedure  SLP Evaluations  $Swallowing Treatment 1 Procedure

## 2015-12-26 NOTE — Care Management Important Message (Signed)
Important Message  Patient Details  Name: [name] MRN: [id number] Date of Birth: 1955-09-03   Medicare Important Message Given:  Yes    Orbie Pyo 12/26/2015, 10:52 AM

## 2015-12-26 NOTE — Progress Notes (Signed)
[facility] Progress Note   Subjective:  "I'm at the dialysis unit! Why do ya'll keep asking me where I am?" Denies pain. On O2 per [place] without WOB.   Objective Vitals:   12/25/15 1215 12/25/15 1711 12/25/15 2339 12/26/15 0500  BP: (!) 158/75 (!) 193/67 (!) 180/79 (!) 187/76  Pulse: 68 69 68 70  Resp: 20 18 19 20   Temp: 97.6 F (36.4 C) 97.8 F (36.6 C) 98.7 F (37.1 C) 98.5 F (36.9 C)  TempSrc: Oral Oral Oral Oral  SpO2: 100% 98% 100% 99%  Weight: 50.8 kg (111 lb 15.9 oz)  50.6 kg (111 lb 8.8 oz)   Height:       Physical Exam General: Chronically ill appearing male-looks older than stated age Heart: S1,S2 Lungs: BBS CTA A/P. No WOB Abdomen: Abdomen soft, nontender Extremities: R BKA with staples intact/no drainage. No LLE edema Dialysis Access: LUA AVF + bruit  Dialysis Orders:  [facility] TTS  4h 2K/2.25 Ca 400/auto 1.5 UF Profile 2 EDW 56kg LAVF  No Heparin  Hectorol 7 mcg IV q treatment  Mircera 100 mcg IV q 2 weeks (last dose 150 mcg IV 12/13/15)  OP Labs: Hgb 11.2 Tsat 54% K 4.7 Corr Ca 10.3 iPTH 73  Additional Objective Labs: Basic Metabolic Panel:  Recent Labs Lab 12/23/15 0814 12/24/15 0511 12/25/15 0913  NA 139 136 141  K 4.2 3.5 3.7  CL 96* 93* 101  CO2 30 24 27   GLUCOSE 84 43* 63*  BUN 31* 18 26*  CREATININE 7.13* 5.03* 6.67*  CALCIUM 11.9* 9.7 10.6*  PHOS  --   --  6.6*   Liver Function Tests:  Recent Labs Lab 12/23/15 0814 12/24/15 0511 12/25/15 0913  AST 26 25  --   ALT 14* 12*  --   ALKPHOS 70 64  --   BILITOT 0.8 0.9  --   PROT 7.4 7.5  --   ALBUMIN 3.2* 3.2* 3.0*   CBC:  Recent Labs Lab 12/23/15 0814 12/24/15 0511 12/25/15 0912  WBC 5.5 6.2 5.8  NEUTROABS 4.0  --   --   HGB 12.4* 12.1* 11.6*  HCT 41.2 39.7 38.0*  MCV 93.8 93.6 92.7  PLT 190 147* 158   Blood Culture    Component Value Date/Time   SDES URINE, CATHETERIZED 12/23/2015 1057   SPECREQUEST NONE 12/23/2015 1057   CULT NO GROWTH 12/23/2015  1057   REPTSTATUS 12/24/2015 FINAL 12/23/2015 1057    Cardiac Enzymes: No results for input(s): CKTOTAL, CKMB, CKMBINDEX, TROPONINI in the last 168 hours. CBG:  Recent Labs Lab 12/25/15 1747 12/25/15 2112 12/26/15 0002 12/26/15 0457 12/26/15 0737  GLUCAP 90 88 90 80 79   Studies/Results: Mr Brain Wo Contrast  Result Date: 12/24/2015 CLINICAL DATA:  Seizure disorder.  Altered mental status. EXAM: MRI HEAD WITHOUT CONTRAST TECHNIQUE: Multiplanar, multiecho pulse sequences of the brain and surrounding structures were obtained without intravenous contrast. COMPARISON:  Head CT 12/10/2015, brain MRI 01/21/2014 FINDINGS: Brain: No acute infarct or intraparenchymal hemorrhage. The midline structures are normal. There is confluent hyperintense T2 weighted signal within the periventricular white matter and brainstem. No mass lesion or midline shift. No hydrocephalus or extra-axial fluid collection. There is advanced atrophy for age. Vascular: Major intracranial flow voids are preserved. No evidence of chronic microhemorrhage or amyloid angiopathy. Skull and upper cervical spine: The visualized skull base, calvarium, upper cervical spine and extracranial soft tissues are normal. Sinuses/Orbits: No fluid levels or advanced mucosal thickening. No mastoid  effusion. Normal orbits. IMPRESSION: 1. No acute intracranial abnormality. 2. Findings of advanced chronic microvascular ischemia involving the periventricular white matter and brainstem. This appears unchanged compared to 01/21/2014. 3. Advanced atrophy for age. Electronically Signed   By: Ulyses Jarred M.D.   On: 12/24/2015 23:21   Medications:   . cefTRIAXone (ROCEPHIN)  IV  1 g Intravenous Q24H  . feeding supplement  1 Container Oral BID BM  . feeding supplement (PRO-STAT SUGAR FREE 64)  30 mL Oral BID BM  . heparin  5,000 Units Subcutaneous Q8H  . levETIRAcetam  500 mg Intravenous Q12H  . sodium chloride  1,500 mL Intravenous Once  . sodium  chloride flush  3 mL Intravenous Q12H     Assessment/Plan: 1. Altered mental status - recurrent issue, w/u per primary -MRI 09/06 shows no acute abnormalities, No hydrocephalus, advanced atrophy for age. EEG is normal.   h/o of seizure and polysubstance abuse, but tox screen negative for opiates/ cocaine, etc.  Dehydrated by wts and exam, under dry wt by 6kg.  Remains disoriented but is responding to verbal.  2. ESRD - T,TH,S at [facility]. Last HD 12/25/15. K+ 3.7 4.0 K bath 3. Anemia - Hgb 11.6 - No ESA for now. Follow HGB 4. Secondary hyperparathyroidism - Ca 10.6 C Ca 11.4 Phos 6.6. Start renvela powder binders, No VDRA. 2.0 Ca bath.  5. HTN - BP stable on losartan/labetalol/hydralazine as outpatient - BP meds on hold. Hypertensive at present. Needs meds resumed. Check with primary.  6. Volume -HD 12/25/15 pre wt 50.3 kg Net UF -500 post wt 50.8 kg. Will have HD tomorrow on schedule. Attempt UFG 0.5-1 liter now that BP is high and pt has resumed PO intake.  6. Nutrition - Alb 3.0 Dysphagia 1 diet/thin liquids. Renal vit/supplement. 7. H/O Seizures: per primary-continue keppra. EEG normal.   [name]. Brown NP-C 12/26/2015, 9:35 AM  [facility] [phone]  Pt seen, examined and agree w A/P as above. Resuming BP meds for ^'d BP here.  Still confused.  Not sure cause.  Will plan HD tomorrow.  Kelly Splinter MD [employer] pager [phone]    cell [phone] 12/26/2015, 12:35 PM

## 2015-12-27 LAB — CBC
HCT: 40.1 % (ref 39.0–52.0)
Hemoglobin: 12.2 g/dL — ABNORMAL LOW (ref 13.0–17.0)
MCH: 28 pg (ref 26.0–34.0)
MCHC: 30.4 g/dL (ref 30.0–36.0)
MCV: 92 fL (ref 78.0–100.0)
Platelets: 141 K/uL — ABNORMAL LOW (ref 150–400)
RBC: 4.36 MIL/uL (ref 4.22–5.81)
RDW: 16.1 % — ABNORMAL HIGH (ref 11.5–15.5)
WBC: 7.4 10*3/uL (ref 4.0–10.5)

## 2015-12-27 LAB — RENAL FUNCTION PANEL
Albumin: 2.9 g/dL — ABNORMAL LOW (ref 3.5–5.0)
Anion gap: 13 (ref 5–15)
BUN: 18 mg/dL (ref 6–20)
CO2: 27 mmol/L (ref 22–32)
Calcium: 10.5 mg/dL — ABNORMAL HIGH (ref 8.9–10.3)
Chloride: 98 mmol/L — ABNORMAL LOW (ref 101–111)
Creatinine, Ser: 6.05 mg/dL — ABNORMAL HIGH (ref 0.61–1.24)
GFR calc Af Amer: 11 mL/min — ABNORMAL LOW (ref >60)
GFR calc non Af Amer: 9 mL/min — ABNORMAL LOW (ref >60)
Glucose, Bld: 87 mg/dL (ref 65–99)
Phosphorus: 6.5 mg/dL — ABNORMAL HIGH (ref 2.5–4.6)
Potassium: 4.1 mmol/L (ref 3.5–5.1)
Sodium: 138 mmol/L (ref 135–145)

## 2015-12-27 LAB — GLUCOSE, CAPILLARY
GLUCOSE-CAPILLARY: 85 mg/dL (ref 65–99)
Glucose-Capillary: 83 mg/dL (ref 65–99)
Glucose-Capillary: 88 mg/dL (ref 65–99)
Glucose-Capillary: 71 mg/dL (ref 65–99)

## 2015-12-27 MED ORDER — SODIUM CHLORIDE 0.9 % IV SOLN
500.0000 mg | Freq: Two times a day (BID) | INTRAVENOUS | Status: DC
  Administered 2015-12-27: 500 mg via INTRAVENOUS
  Filled 2015-12-27 (×2): qty 5

## 2015-12-27 MED ORDER — SODIUM CHLORIDE 0.9 % IV BOLUS (SEPSIS)
1000.0000 mL | Freq: Once | INTRAVENOUS | Status: AC
  Administered 2015-12-27: 1000 mL via INTRAVENOUS

## 2015-12-27 MED ORDER — LABETALOL HCL 100 MG PO TABS
100.0000 mg | ORAL_TABLET | Freq: Three times a day (TID) | ORAL | Status: DC
  Administered 2015-12-27 – 2015-12-28 (×3): 100 mg via ORAL
  Filled 2015-12-27 (×3): qty 1

## 2015-12-27 MED ORDER — HYDRALAZINE HCL 50 MG PO TABS
100.0000 mg | ORAL_TABLET | Freq: Three times a day (TID) | ORAL | Status: DC
  Administered 2015-12-27 – 2016-01-01 (×15): 100 mg via ORAL
  Filled 2015-12-27 (×16): qty 2

## 2015-12-27 NOTE — Progress Notes (Signed)
[facility] Progress Note   Subjective: Seen on HD. More lethargic than yesterday. Still oriented to person only. No C/Os.     Objective Vitals:   12/27/15 0755 12/27/15 0801 12/27/15 0805 12/27/15 0830  BP: (!) 193/89 (!) 177/93 (!) 181/92 (!) 143/81  Pulse: 81 81 83 80  Resp: 15     Temp: 98.1 F (36.7 C)     TempSrc: Oral     SpO2: 100%     Weight: 48.5 kg (106 lb 14.8 oz)     Height:       Physical Exam General: Chronically ill appearing male-lethargic, NAD Heart: S1,S2 RRR Lungs: BBS CTA A/P. No WOB Abdomen: Abdomen soft, nontender Extremities: R BKA with staples intact/no drainage. No LLE edema Dialysis Access: LUA AVF cannulated at present/no issues.   Dialysis Orders:  [facility] TTS  4h 2K/2.25 Ca 400/auto 1.5 UF Profile 2 EDW 56kg LAVF  No Heparin  Hectorol 7 mcg IV q treatment  Mircera 100 mcg IV q 2 weeks (last dose 150 mcg IV 12/13/15)  OP Labs: Hgb 11.2 Tsat 54% K 4.7 Corr Ca 10.3 iPTH 73  Additional Objective Labs: Basic Metabolic Panel:  Recent Labs Lab 12/24/15 0511 12/25/15 0913 12/27/15 0811  NA 136 141 138  K 3.5 3.7 4.1  CL 93* 101 98*  CO2 24 27 27   GLUCOSE 43* 63* 87  BUN 18 26* 18  CREATININE 5.03* 6.67* 6.05*  CALCIUM 9.7 10.6* 10.5*  PHOS  --  6.6* 6.5*   Liver Function Tests:  Recent Labs Lab 12/23/15 0814 12/24/15 0511 12/25/15 0913 12/27/15 0811  AST 26 25  --   --   ALT 14* 12*  --   --   ALKPHOS 70 64  --   --   BILITOT 0.8 0.9  --   --   PROT 7.4 7.5  --   --   ALBUMIN 3.2* 3.2* 3.0* 2.9*   CBC:  Recent Labs Lab 12/23/15 0814 12/24/15 0511 12/25/15 0912 12/27/15 0811  WBC 5.5 6.2 5.8 7.4  NEUTROABS 4.0  --   --   --   HGB 12.4* 12.1* 11.6* 12.2*  HCT 41.2 39.7 38.0* 40.1  MCV 93.8 93.6 92.7 92.0  PLT 190 147* 158 141*   Blood Culture    Component Value Date/Time   SDES URINE, CATHETERIZED 12/23/2015 1057   SPECREQUEST NONE 12/23/2015 1057   CULT NO GROWTH 12/23/2015 1057   REPTSTATUS  12/24/2015 FINAL 12/23/2015 1057    Cardiac Enzymes: No results for input(s): CKTOTAL, CKMB, CKMBINDEX, TROPONINI in the last 168 hours. CBG:  Recent Labs Lab 12/26/15 1646 12/26/15 1952 12/26/15 2351 12/27/15 0401 12/27/15 0730  GLUCAP 86 82 88 83 85   Iron Studies: No results for input(s): IRON, TIBC, TRANSFERRIN, FERRITIN in the last 72 hours. @lablastinr3 @ Studies/Results: No results found. Medications:   . cefTRIAXone (ROCEPHIN)  IV  1 g Intravenous Q24H  . feeding supplement  1 Container Oral BID BM  . feeding supplement (PRO-STAT SUGAR FREE 64)  30 mL Oral BID BM  . heparin  5,000 Units Subcutaneous Q8H  . levETIRAcetam  500 mg Intravenous Q12H  . losartan  100 mg Oral QHS  . sevelamer carbonate  2.4 g Oral TID WC  . sodium chloride  1,500 mL Intravenous Once  . sodium chloride flush  3 mL Intravenous Q12H     Assessment/Plan: 1. Altered mental status - recurrent issue,w/u per primary -MRI 09/06 shows no acute abnormalities,  No hydrocephalus, advanced atrophy for age. EEG is normal.   h/o of seizure and polysubstance abuse, but tox screen negative for opiates/ cocaine, etc.Remains disoriented but is responding to verbal. More lethargic today.  2. ESRD- T,TH,S at [facility]. On HD now. K+ 4.1. Change to 3.0 K bath.  3. Anemia- Hgb 12.2 - No ESA. Follow HGB 4. Secondary hyperparathyroidism- Ca 10.5 C Ca 11.4 Phos 6.5. Start renvela powder binders, No VDRA.    5.HTN - BP stable on losartan/labetalol/hydralazine as outpatient -losartan has been resumed. Still high. Resume hydralazine and labetolol.  6. Volume-on HD tolerating well. Pre wt 48.5 UFG 500-running even. Adjust EDW upon DC as he is almost 7.5 under EDW.  6. Nutrition- Alb 2.9Dysphagia 1 diet/thin liquids. Renal vit/supplement. Add prostat.  7. H/O Seizures: per primary-continue keppra. EEG normal.    Jimmye Norman. Brown NP-C 12/27/2015, 8:57 AM  [facility] [phone]  Pt seen, examined and  agree w A/P as above. Still confused, looks dry, will start IVF's and see if this helps any.  No explanation for AMS yet, even though his Keppra level is not "toxic", I think maybe we should consider holding the Keppra and see if his TME improves.  Will d/w primary.   Kelly Splinter MD [employer] pager [phone]    cell [phone] 12/27/2015, 10:20 AM

## 2015-12-27 NOTE — Progress Notes (Signed)
[facility]  PROGRESS NOTE  [name] [id number] DOB: 26-May-1955 DOA: 12/23/2015 PCP: No PCP Per Patient    Brief HPI:  60 y.o. male with medical history significant for chronic kidney disease on dialysis, known seizure disorder on Keppra, hypertension with LVH, anemia of chronic kidney disease, dyslipidemia, prolonged QT interval on EKG which is chronic, history of polysubstance abuse with prior admissions for alcohol withdrawal, history of cocaine use, history of hepatitis C anybody positive and prior hepatitis B infection, and acute GI bleeding during previous admission August of this year. 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 He was recently discharged on 8/25. He had been admitted due to altered mentation presumed related to polypharmacy.  patient was sent to the ER from dialysis after they noticed altered mentation. He was attempting to verbalize but responses were unintelligible and very soft. He was unable to follow commands but was spontaneously moving all of his extremities. He last completed dialysis on Saturday. In the ER his initial rectal temperature was 95.5, he was hypertensive, not hypoxic but in setting of altered mentation and known dialysis patient at risk for bacterial infections ER treated him as sepsis protocol. He has remained hypertensive and altered with normal lactic acid, no leukocytosis or elevation in neutrophils and no definitive source although blood cultures collected and pending. His other labs are at baseline for him given need for dialysis which is due today. This felt at this juncture patient did not have sepsis etiology to his altered mentation and other etiologies are now being pursued.    Assessment/Plan:    1. Acute encephalopathy- unclear cause, EEG showed no epileptiform discharges. MRI could not be performed earlier, but  MRI done  after giving 2 mg IV Ativan which showed no significant abnormality. Neurontin has been  discontinued by nephrology. Swallow evaluation done patient started on full liquid diet. Will consult neurology 2. Seizure disorder- EEG negative for any epileptiform discharges, patient has been on Keppra, Keppra level 37.9, will consult neurology for possible switching over to another antiepileptic medication in case Keppra is causing these symptoms. 3. ? UTI- patient was started on empiric  ceftriaxone, urine culture shows no growth. Discontinue ceftriaxone at this time. 4. ESRD on hemodialysis-patient on Tuesday Thursday Saturday hemodialysis. Nephrology following. Dry weight down 6 kg, patient to be given IV fluids during HD. 5. Prolonged QT interval- chronic,  checked serum magnesium 2.2. Avoid Haldol Zofran and fluoroquinolones. 6. Vomiting- ? Cause, start Phenergan 12.5 mg IV q 8 hr prn.    DVT prophylaxis: heparin Code Status: Full code Family Communication: No family at bedside Disposition Plan: Pending improvement in mental status   Consultants:   Nephrology  Procedures:  EEG  Antibiotics:  Ceftriaxone  Subjective   Patient seen and examined, Mental status is slowly improving. MRI showed no acute abnormality. Patient continues to be lethargic.  Objective    Objective: Vitals:   12/27/15 1100 12/27/15 1115 12/27/15 1130 12/27/15 1203  BP: (!) 159/83 (!) 167/80 (!) 162/82 (!) 164/84  Pulse: 77 78 78 78  Resp:    19  Temp:    98.1 F (36.7 C)  TempSrc:    Oral  SpO2:    100%  Weight:    48.2 kg (106 lb 4.2 oz)  Height:        Intake/Output Summary (Last 24 hours) at 12/27/15 1322 Last data filed at 12/27/15 1203  Gross per 24 hour  Intake  235 ml  Output             -172 ml  Net              407 ml   Filed Weights   12/25/15 2339 12/27/15 0755 12/27/15 1203  Weight: 50.6 kg (111 lb 8.8 oz) 48.5 kg (106 lb 14.8 oz) 48.2 kg (106 lb 4.2 oz)    Examination:  General exam: Appears calm and comfortable  Respiratory system: Clear to  auscultation. Respiratory effort normal. Cardiovascular system: S1 & S2 heard, RRR. No JVD, murmurs, rubs, gallops or clicks. No pedal edema. Gastrointestinal system: Abdomen is nondistended, soft and nontender. No organomegaly or masses felt. Normal bowel sounds heard. Central nervous system: Alert and oriented x 2. No focal neurological deficits. Extremities: Symmetric 5 x 5 power. Skin: No rashes, lesions or ulcers Psychiatry: Judgement and insight appear normal. Mood & affect appropriate.    Data Reviewed: I have personally reviewed following labs and imaging studies Basic Metabolic Panel:  Recent Labs Lab 12/23/15 0814 12/24/15 0511 12/25/15 0913 12/27/15 0811  NA 139 136 141 138  K 4.2 3.5 3.7 4.1  CL 96* 93* 101 98*  CO2 30 24 27 27   GLUCOSE 84 43* 63* 87  BUN 31* 18 26* 18  CREATININE 7.13* 5.03* 6.67* 6.05*  CALCIUM 11.9* 9.7 10.6* 10.5*  MG  --  2.2  --   --   PHOS  --   --  6.6* 6.5*   Liver Function Tests:  Recent Labs Lab 12/23/15 0814 12/24/15 0511 12/25/15 0913 12/27/15 0811  AST 26 25  --   --   ALT 14* 12*  --   --   ALKPHOS 70 64  --   --   BILITOT 0.8 0.9  --   --   PROT 7.4 7.5  --   --   ALBUMIN 3.2* 3.2* 3.0* 2.9*   No results for input(s): LIPASE, AMYLASE in the last 168 hours. No results for input(s): AMMONIA in the last 168 hours. CBC:  Recent Labs Lab 12/23/15 0814 12/24/15 0511 12/25/15 0912 12/27/15 0811  WBC 5.5 6.2 5.8 7.4  NEUTROABS 4.0  --   --   --   HGB 12.4* 12.1* 11.6* 12.2*  HCT 41.2 39.7 38.0* 40.1  MCV 93.8 93.6 92.7 92.0  PLT 190 147* 158 141*   Cardiac Enzymes: No results for input(s): CKTOTAL, CKMB, CKMBINDEX, TROPONINI in the last 168 hours. BNP (last 3 results)  Recent Labs  08/16/15 2256  BNP 157.1*    ProBNP (last 3 results) No results for input(s): PROBNP in the last 8760 hours.  CBG:  Recent Labs Lab 12/26/15 1646 12/26/15 1952 12/26/15 2351 12/27/15 0401 12/27/15 0730  GLUCAP 86 82 88  83 85    Recent Results (from the past 240 hour(s))  Blood Culture (routine x 2)     Status: None (Preliminary result)   Collection Time: 12/23/15  8:16 AM  Result Value Ref Range Status   Specimen Description BLOOD RIGHT FOREARM  Final   Special Requests BOTTLES DRAWN AEROBIC AND ANAEROBIC 10CC  Final   Culture NO GROWTH 4 DAYS  Final   Report Status PENDING  Incomplete  Blood Culture (routine x 2)     Status: None (Preliminary result)   Collection Time: 12/23/15  9:43 AM  Result Value Ref Range Status   Specimen Description BLOOD RIGHT HAND  Final   Special Requests BOTTLES DRAWN AEROBIC AND ANAEROBIC 5CC  Final   Culture NO GROWTH 4 DAYS  Final   Report Status PENDING  Incomplete  Urine culture     Status: None   Collection Time: 12/23/15 10:57 AM  Result Value Ref Range Status   Specimen Description URINE, CATHETERIZED  Final   Special Requests NONE  Final   Culture NO GROWTH  Final   Report Status 12/24/2015 FINAL  Final     Studies: No results found.  Scheduled Meds: . cefTRIAXone (ROCEPHIN)  IV  1 g Intravenous Q24H  . feeding supplement  1 Container Oral BID BM  . feeding supplement (PRO-STAT SUGAR FREE 64)  30 mL Oral BID BM  . heparin  5,000 Units Subcutaneous Q8H  . hydrALAZINE  100 mg Oral Q8H  . labetalol  100 mg Oral Q8H  . levETIRAcetam  500 mg Intravenous Q12H  . losartan  100 mg Oral QHS  . sevelamer carbonate  2.4 g Oral TID WC  . sodium chloride  1,500 mL Intravenous Once  . sodium chloride flush  3 mL Intravenous Q12H   Continuous Infusions:      Time spent: 25 min    [facility] Pager [phone]. If 7PM-7AM, please contact night-coverage at [email or web address], Office  [phone]  password [email or web address] 12/27/2015, 1:22 PM  LOS: 4 days

## 2015-12-27 NOTE — Consult Note (Signed)
Admission H&P    Chief Complaint: Altered mental status.  HPI: [name] is an 60 y.o. male with a history of seizure disorder, end-stage renal disease on dialysis, hypertension, recent right BKA amputation, multi-substance abuse and CHF admitted on 12/23/2015 for altered mental status following dialysis. Patient was noted to likely be septic and has been treated with IV antibiotics. He has remained encephalopathic, however. MRI of his brain on 12/24/2015 showed no acute abnormality. Advanced chronic microvascular ischemic changes were noted. EEG on 12/24/2015 showed no epileptiform discharges. Study was normal. Neurology consultation was obtained for this evaluation for possible change in anticonvulsant medication. He's been taking Keppra 500 mg twice a day and has been on Keppra for at least several years. It's unclear when his last seizure occurred, but apparently more than one year ago. He was also on Neurontin, which has been discontinued.  Past Medical History:  Diagnosis Date  . Anemia, chronic disease    a. Capsule endoscopy reportedly negative 08/2013. b. seen by heme with concern for minor B cell population on BMB, being observed.  . Arthritis    "right shoulder" (11/09/2012)  . Chronic diastolic CHF (congestive heart failure) ([place])    a. EF 60 - 65% per [place] echo 11/2011. b. Echo 02/2012: severe LVH, focal basal hypertrophy, EF 60-65%, mild Mr.  . Cocaine abuse    mentioned in notes from [place]  . ESRD (end stage renal disease) on dialysis [facility])    "TTS; [address]" (06/19/2015)  . ESRD on hemodialysis ([address]) since 2012   a. Since 2012. ESRD was due to "drugs", primarily used cocaine.  Has 3-5 year hx of HTN, no DM.  Gets HD on TTS schedule at [facility]. Originally from [place].   . GI bleed 05/04/2012  . Head injury, closed, with concussion ([place])   . Headache(784.0)   . Helicobacter pylori gastritis    not defined if this was treated  . Hematochezia   . Hepatitis B  core antibody positive    03/01/10  . Hepatitis C antibody test positive    was HIV negative, 02/28/12  . History of blood transfusion   . Hypercalcemia   . Hyperpotassemia   . Hypertension   . Hypertensive heart disease   . Optic neuritis, left   . Polyp of colon, adenomatous    May 2012.  Dr Trenton Founds in   . Positive QuantiFERON-TB Gold test    11/2011  . Protein-calorie malnutrition, severe ([place])   . Seizure disorder [facility])    questionable history of - will need to clarify with PCP  . Tobacco abuse     Past Surgical History:  Procedure Laterality Date  . [place] TRANSPOSITION  03/07/2012   Procedure: BASCILIC VEIN TRANSPOSITION;  Surgeon: Conrad Concepcion, MD;  Location: [place];  Service: Vascular;  Laterality: Left;  First Stage  . BASCILIC VEIN TRANSPOSITION Left 05/31/2012   Procedure: BASCILIC VEIN TRANSPOSITION;  Surgeon: Conrad Indianola, MD;  Location: [place];  Service: Vascular;  Laterality: Left;  Left 2nd Stage Basilic Vein Transposition with gortex graft revision using 65mx10cm graft  . [name] CAPSULE STUDY N/A 08/27/2013   Procedure: [name] CAPSULE STUDY;  Surgeon: DMilus Banister MD;  [place]  Service: Endoscopy;  Laterality: N/A;  . INSERTION OF DIALYSIS CATHETER     right chest  . JOINT REPLACEMENT    . SHOULDER OPEN ROTATOR CUFF REPAIR Right   . TOTAL KNEE ARTHROPLASTY Left  Family History  Problem Relation Age of Onset  . Diabetes Mother   . Hypertension Mother   . Stroke Mother   . Kidney failure Mother   . Cancer Father    Social History:  reports that he has been smoking Cigarettes.  He has a 67.50 pack-year smoking history. He has never used smokeless tobacco. He reports that he drinks about 14.4 oz of alcohol per week . He reports that he uses drugs, including "Crack" cocaine, Cocaine, and Marijuana.  Allergies:  Allergies  Allergen Reactions  . Reglan [Metoclopramide] Other (See Comments)    Tardive dyskinesia in 11/2011  in [place]    Medications Prior to Admission  Medication Sig Dispense Refill  . acetaminophen (TYLENOL) 325 MG tablet Take 650 mg by mouth every 4 (four) hours as needed.    Marland Kitchen amantadine (SYMMETREL) 100 MG capsule Take 100 mg by mouth 3 (three) times daily.    Marland Kitchen aspirin EC 81 MG EC tablet Take 1 tablet (81 mg total) by mouth daily. 30 tablet 3  . atorvastatin (LIPITOR) 40 MG tablet Take 1 tablet (40 mg total) by mouth at bedtime. 30 tablet 3  . B Complex-C-Folic Acid (RENA-VITE PO) Take 1 tablet by mouth daily.    . calcium acetate (PHOSLO) 667 MG capsule Take 667 mg by mouth 3 (three) times daily with meals.     . docusate sodium (COLACE) 100 MG capsule Take 100 mg by mouth 2 (two) times daily.    Marland Kitchen gabapentin (NEURONTIN) 100 MG capsule Take 200 mg by mouth every 12 (twelve) hours.     Marland Kitchen glucagon (GLUCAGON EMERGENCY) 1 MG injection Inject 1 mg into the vein once as needed.    . hydrALAZINE (APRESOLINE) 100 MG tablet Take 1 tablet (100 mg total) by mouth every 8 (eight) hours. 90 tablet 0  . HYDROcodone-acetaminophen (NORCO/VICODIN) 5-325 MG tablet Take 1 tablet by mouth every 6 (six) hours as needed (for pain). 10 tablet 0  . labetalol (NORMODYNE) 100 MG tablet Take 1 tablet (100 mg total) by mouth 3 (three) times daily. 90 tablet 0  . levETIRAcetam (KEPPRA) 500 MG tablet Take 500 mg by mouth 2 (two) times daily.    Marland Kitchen losartan (COZAAR) 100 MG tablet Take 100 mg by mouth at bedtime.     . mirtazapine (REMERON) 7.5 MG tablet Take 7.5 mg by mouth at bedtime.    . ondansetron (ZOFRAN) 4 MG tablet Take 4 mg by mouth every 8 (eight) hours as needed for nausea or vomiting.    . polyethylene glycol (MIRALAX / GLYCOLAX) packet Take 17 g by mouth daily. 14 each 0  . saccharomyces boulardii (FLORASTOR) 250 MG capsule Take 250 mg by mouth daily.    Marland Kitchen senna-docusate (SENOKOT-S) 8.6-50 MG tablet Take 1 tablet by mouth 2 (two) times daily. For constipation 60 tablet 3  . verapamil (CALAN) 120 MG tablet  Take 1 tablet (120 mg total) by mouth every 8 (eight) hours. (Patient not taking: Reported on 12/24/2015) 60 tablet 3    ROS: Unavailable due to patient's mild confusional state.  Physical Examination: Blood pressure (!) 193/63, pulse 73, temperature 98.4 F (36.9 C), temperature source Oral, resp. rate 20, height 5' 10"  (1.778 m), weight 49.8 kg (109 lb 12.6 oz), SpO2 99 %.  HEENT-  Normocephalic, no lesions, without obvious abnormality.  Normal external eye and conjunctiva.  Normal TM's bilaterally.  Normal auditory canals and external ears. Normal external nose, mucus membranes and septum.  Normal pharynx.  Neck supple with no masses, nodes, nodules or enlargement. Cardiovascular - regular rate and rhythm, S1, S2 normal, no murmur, click, rub or gallop Lungs - chest clear, no wheezing, rales, normal symmetric air entry Abdomen - soft, non-tender; bowel sounds normal; no masses,  no organomegaly Extremities - recent right BKA amputation with healing wound, staples intact  Neurologic Examination: Mental Status: Alert, oriented to current month and to place, but disoriented to current age, no acute distress.  Speech slightly slurred without evidence of aphasia. Able to follow commands without difficulty. Cranial Nerves: II-Visual fields were normal. III/IV/VI-Pupils were equal and reacted normally to light. Extraocular movements were full and conjugate.    V/VII-no facial numbness and no facial weakness. VIII-normal. X-mild dysarthria; symmetrical palatal movement. XI: trapezius strength/neck flexion strength normal bilaterally XII-midline tongue extension with normal strength. Motor: 5/5 bilaterally; mild asterixis bilaterally Sensory: Normal throughout. Deep Tendon Reflexes: Absent throughout. Plantars: Flexor bilaterally Cerebellar: Normal finger-to-nose testing except for mild tremulousness bilaterally.  Results for orders placed or performed during the hospital encounter of  12/23/15 (from the past 48 hour(s))  Glucose, capillary     Status: None   Collection Time: 12/25/15  9:12 PM  Result Value Ref Range   Glucose-Capillary 88 65 - 99 mg/dL  Glucose, capillary     Status: None   Collection Time: 12/26/15 12:02 AM  Result Value Ref Range   Glucose-Capillary 90 65 - 99 mg/dL  Glucose, capillary     Status: None   Collection Time: 12/26/15  4:57 AM  Result Value Ref Range   Glucose-Capillary 80 65 - 99 mg/dL  Glucose, capillary     Status: None   Collection Time: 12/26/15  7:37 AM  Result Value Ref Range   Glucose-Capillary 79 65 - 99 mg/dL  Glucose, capillary     Status: None   Collection Time: 12/26/15 11:41 AM  Result Value Ref Range   Glucose-Capillary 88 65 - 99 mg/dL  Glucose, capillary     Status: None   Collection Time: 12/26/15  4:46 PM  Result Value Ref Range   Glucose-Capillary 86 65 - 99 mg/dL  Glucose, capillary     Status: None   Collection Time: 12/26/15  7:52 PM  Result Value Ref Range   Glucose-Capillary 82 65 - 99 mg/dL  Glucose, capillary     Status: None   Collection Time: 12/26/15 11:51 PM  Result Value Ref Range   Glucose-Capillary 88 65 - 99 mg/dL  Glucose, capillary     Status: None   Collection Time: 12/27/15  4:01 AM  Result Value Ref Range   Glucose-Capillary 83 65 - 99 mg/dL   Comment 1 Notify RN   Glucose, capillary     Status: None   Collection Time: 12/27/15  7:30 AM  Result Value Ref Range   Glucose-Capillary 85 65 - 99 mg/dL  CBC     Status: Abnormal   Collection Time: 12/27/15  8:11 AM  Result Value Ref Range   WBC 7.4 4.0 - 10.5 K/uL   RBC 4.36 4.22 - 5.81 MIL/uL   Hemoglobin 12.2 (L) 13.0 - 17.0 g/dL   HCT 40.1 39.0 - 52.0 %   MCV 92.0 78.0 - 100.0 fL   MCH 28.0 26.0 - 34.0 pg   MCHC 30.4 30.0 - 36.0 g/dL   RDW 16.1 (H) 11.5 - 15.5 %   Platelets 141 (L) 150 - 400 K/uL  Renal function panel     Status: Abnormal   Collection  Time: 12/27/15  8:11 AM  Result Value Ref Range   Sodium 138 135 - 145  mmol/L   Potassium 4.1 3.5 - 5.1 mmol/L   Chloride 98 (L) 101 - 111 mmol/L   CO2 27 22 - 32 mmol/L   Glucose, Bld 87 65 - 99 mg/dL   BUN 18 6 - 20 mg/dL   Creatinine, Ser 6.05 (H) 0.61 - 1.24 mg/dL   Calcium 10.5 (H) 8.9 - 10.3 mg/dL   Phosphorus 6.5 (H) 2.5 - 4.6 mg/dL   Albumin 2.9 (L) 3.5 - 5.0 g/dL   GFR calc non Af Amer 9 (L) >60 mL/min   GFR calc Af Amer 11 (L) >60 mL/min    Comment: (NOTE) The eGFR has been calculated using the CKD EPI equation. This calculation has not been validated in all clinical situations. eGFR's persistently <60 mL/min signify possible Chronic Kidney Disease.    Anion gap 13 5 - 15  Glucose, capillary     Status: None   Collection Time: 12/27/15  4:26 PM  Result Value Ref Range   Glucose-Capillary 88 65 - 99 mg/dL  Glucose, capillary     Status: None   Collection Time: 12/27/15  8:23 PM  Result Value Ref Range   Glucose-Capillary 71 65 - 99 mg/dL   No results found.  Assessment/Plan 60 year old man with multiple medical problems, including seizure disorder and end-stage renal disease on dialysis, as well as admission for probable abscess, with persistent encephalopathic state. MRI is shown no acute intracranial abnormality. EEG showed no signs of seizure activity and reportedly was normal. Patient's seizure disorder is apparently well controlled and he has been taking Keppra for at least several years. Etiology for encephalopathic state is unclear but likely secondary to multiple factors, including sepsis as well as possible mild underlying dementia. Keppra cannot be ruled out as a contributing factor in patient's encephalopathic state at this point however.  Recommendations: 1. Continue Keppra for 24-48 hours to see if mental status improves without discontinuing this medication 2. If no significant improvement in encephalopathic changes, discontinue Keppra and start Vimpat 200 mg IV initially and then 50 mg twice a day by mouth or IV 3. Agree with  discontinuing Neurontin  We will continue to follow this patient with you.   C.R. Nicole Kindred, MD [facility] [phone]  12/27/2015, 9:05 PM

## 2015-12-27 NOTE — Progress Notes (Signed)
Patient called to patient's room. Patient was projectile vomiting coffee ground emesis on the ground. RN administered PRN Phenergan and on call NP notified. RN will continue to monitor patient.  Ermalinda Memos, RN

## 2015-12-28 ENCOUNTER — Inpatient Hospital Stay (HOSPITAL_COMMUNITY): Payer: Medicare Other

## 2015-12-28 LAB — GLUCOSE, CAPILLARY
GLUCOSE-CAPILLARY: 69 mg/dL (ref 65–99)
GLUCOSE-CAPILLARY: 78 mg/dL (ref 65–99)
Glucose-Capillary: 72 mg/dL (ref 65–99)
Glucose-Capillary: 68 mg/dL (ref 65–99)
Glucose-Capillary: 101 mg/dL — ABNORMAL HIGH (ref 65–99)
Glucose-Capillary: 91 mg/dL (ref 65–99)
Glucose-Capillary: 104 mg/dL — ABNORMAL HIGH (ref 65–99)

## 2015-12-28 LAB — AMMONIA: Ammonia: 45 umol/L — ABNORMAL HIGH (ref 9–35)

## 2015-12-28 LAB — CULTURE, BLOOD (ROUTINE X 2)
Culture: NO GROWTH
Culture: NO GROWTH

## 2015-12-28 MED ORDER — DEXTROSE-NACL 5-0.45 % IV SOLN
INTRAVENOUS | Status: AC
  Administered 2015-12-28 – 2015-12-30 (×4): via INTRAVENOUS

## 2015-12-28 MED ORDER — LABETALOL HCL 200 MG PO TABS
200.0000 mg | ORAL_TABLET | Freq: Three times a day (TID) | ORAL | Status: DC
  Administered 2015-12-28 – 2016-01-01 (×12): 200 mg via ORAL
  Filled 2015-12-28 (×12): qty 1

## 2015-12-28 MED ORDER — LACOSAMIDE 50 MG PO TABS
100.0000 mg | ORAL_TABLET | Freq: Two times a day (BID) | ORAL | Status: DC
Start: 2015-12-28 — End: 2016-01-01
  Administered 2015-12-28 – 2016-01-01 (×8): 100 mg via ORAL
  Filled 2015-12-28 (×8): qty 2

## 2015-12-28 MED ORDER — LACOSAMIDE 50 MG PO TABS: 50.0000 mg | ORAL_TABLET | Freq: Two times a day (BID) | ORAL | Status: DC

## 2015-12-28 MED ORDER — SODIUM CHLORIDE 0.9 % IV SOLN
200.0000 mg | Freq: Once | INTRAVENOUS | Status: AC
  Administered 2015-12-28: 200 mg via INTRAVENOUS
  Filled 2015-12-28: qty 20

## 2015-12-28 MED ORDER — DEXTROSE-NACL 5-0.45 % IV SOLN: INTRAVENOUS | Status: DC

## 2015-12-28 NOTE — Progress Notes (Signed)
Hypoglycemic Event  CBG: 69  Treatment: 15 GM carbohydrate snack  Symptoms: None  Follow-up CBG: Time:0531 CBG Result:72   Possible Reasons for Event: Vomiting  Comments/MD notified:Schorr, NP    [name]

## 2015-12-28 NOTE — Progress Notes (Signed)
[facility] Progress Note   Subjective: "[place]!" Lethargic, appears tremulous. Moves all extremities to commands with prodding. Oriented to self only.  No C/Os.   Objective Vitals:   12/27/15 1737 12/27/15 2023 12/27/15 2253 12/28/15 0436  BP: (!) 193/72 (!) 193/63 (!) 176/62 (!) 169/65  Pulse: 79 73  81  Resp: 18 20  16   Temp: 99.7 F (37.6 C) 98.4 F (36.9 C)  98.5 F (36.9 C)  TempSrc: Oral Oral  Oral  SpO2: 100% 99%  100%  Weight:  49.8 kg (109 lb 12.6 oz)    Height:       Physical Exam General: Chronically ill appearing male-lethargic, NAD Heart: [id number] 2/6 systolic M RRR Lungs: BBS CTA A/P. No WOB Abdomen: Abdomen soft, nontender Extremities:R BKA with staples intact/no drainage. No LLE edema Dialysis Access: LUA AVF +Bruit  Dialysis Orders:[facility] TTS  4h 2K/2.25 Ca 400/auto 1.5 UF Profile2 EDW 56kg LAVF  No Heparin  Hectorol 7 mcg IV q treatment  Mircera 157mcg IV q 2 weeks (last dose 150 mcg IV8/26/17)  OP Labs: Hgb 11.2 Tsat 54% K 4.7 Corr Ca 10.3 iPTH 73  Additional Objective Labs: Basic Metabolic Panel:  Recent Labs Lab 12/24/15 0511 12/25/15 0913 12/27/15 0811  NA 136 141 138  K 3.5 3.7 4.1  CL 93* 101 98*  CO2 24 27 27   GLUCOSE 43* 63* 87  BUN 18 26* 18  CREATININE 5.03* 6.67* 6.05*  CALCIUM 9.7 10.6* 10.5*  PHOS  --  6.6* 6.5*   Liver Function Tests:  Recent Labs Lab 12/23/15 0814 12/24/15 0511 12/25/15 0913 12/27/15 0811  AST 26 25  --   --   ALT 14* 12*  --   --   ALKPHOS 70 64  --   --   BILITOT 0.8 0.9  --   --   PROT 7.4 7.5  --   --   ALBUMIN 3.2* 3.2* 3.0* 2.9*   No results for input(s): LIPASE, AMYLASE in the last 168 hours. CBC:  Recent Labs Lab 12/23/15 0814 12/24/15 0511 12/25/15 0912 12/27/15 0811  WBC 5.5 6.2 5.8 7.4  NEUTROABS 4.0  --   --   --   HGB 12.4* 12.1* 11.6* 12.2*  HCT 41.2 39.7 38.0* 40.1  MCV 93.8 93.6 92.7 92.0  PLT 190 147* 158 141*   Blood Culture     Component Value Date/Time   SDES URINE, CATHETERIZED 12/23/2015 1057   SPECREQUEST NONE 12/23/2015 1057   CULT NO GROWTH 12/23/2015 1057   REPTSTATUS 12/24/2015 FINAL 12/23/2015 1057    Cardiac Enzymes: No results for input(s): CKTOTAL, CKMB, CKMBINDEX, TROPONINI in the last 168 hours. CBG:  Recent Labs Lab 12/27/15 2023 12/28/15 0035 12/28/15 0426 12/28/15 0531 12/28/15 0729  GLUCAP 71 78 69 72 68   Iron Studies: No results for input(s): IRON, TIBC, TRANSFERRIN, FERRITIN in the last 72 hours. @lablastinr3 @ Studies/Results: No results found. Medications: . dextrose 5 % and 0.45% NaCl 75 mL/hr at 12/28/15 0809   . feeding supplement  1 Container Oral BID BM  . feeding supplement (PRO-STAT SUGAR FREE 64)  30 mL Oral BID BM  . heparin  5,000 Units Subcutaneous Q8H  . hydrALAZINE  100 mg Oral Q8H  . labetalol  100 mg Oral Q8H  . lacosamide  50 mg Oral BID  . losartan  100 mg Oral QHS  . sevelamer carbonate  2.4 g Oral TID WC  . sodium chloride flush  3  mL Intravenous Q12H     Assessment/Plan: 1. Altered mental status - recurrent issue,w/u per primary -MRI no acute and EEG normal. Hx of seizure and polysubstance abuse, but tox screen negative for opiates/ cocaine, etc.Remains disoriented but is responding to verbal with encouragment. Possible TME d/t Keppra. Keppra stopped today, started on vimpat. Appreciate neuro following.  2. ESRD- T,TH,S [facility]. Next HD 12/30/15 3. Anemia- Hgb 12.2- No ESA. Follow HGB 4. Secondary hyperparathyroidism- Ca 10.5 C Ca 11.4 Phos 6.5. Start renvela powder binders, No VDRA.    5.HTN - BP stable on losartan/labetalol/hydralazine as outpatient -losartan has been resumed.  Resume hydralazine and labetolol. Remains hypertensive. ^ labetolol.  6. Volume-appears dry on exam. Well below edw. Started D51/2 NS @ 75cc/hr 6. Nutrition- Alb 2.9 Dysphagia 1 diet/thin liquids. Renal vit/supplement. Add prostat.  7. H/O Seizures:per  primary-Keppra on hold to see if mental status improves. Started Vimpat. . EEG normal.   [name]. Brown NP-C 12/28/2015, 9:24 AM  [facility] [phone]  Pt seen, examined and agree w A/P as above.  Kelly Splinter MD [employer] pager [phone]    cell [phone] 12/28/2015, 11:38 AM

## 2015-12-28 NOTE — Progress Notes (Signed)
Subjective: Continues to be encephalopathic  Exam: Vitals:   12/27/15 2253 12/28/15 0436  BP: (!) 176/62 (!) 169/65  Pulse:  81  Resp:  16  Temp:  98.5 F (36.9 C)   Gen: In bed, NAD Resp: non-labored breathing, no acute distress Abd: soft, nt  Neuro: MS: Lethargic, but able to be aroused and at that point is only oriented to person CN: Visual fields full, EOMI Motor: Moves all extremities to command Sensory: Endorses sensation symmetric bilaterally  Pertinent Labs: Mild hypercalcemia  Impression: 60 year old male with encephalopathy of unclear origin. Without improvement overnight, I agree with discontinuing Keppra and starting Vimpat, though I would favor using 100 mg twice a day from the start given that he will likely need a therapeutic drug level from the beginning.(50 mg twice a day as the starting dose, but 100 mg twice a day is the lowest dose felt to be therapeutic)  Recommendations: 1) agree with changing Keppra to Vimpat 2) ammonia, a.m. Cortisol 3) neurology will continue to follow  Roland Rack, MD [facility] [phone]  If 7pm- 7am, please page neurology on call as listed in [place].

## 2015-12-28 NOTE — Progress Notes (Signed)
[facility]  PROGRESS NOTE  [name] [id number] DOB: 12/09/1955 DOA: 12/23/2015 PCP: No PCP Per Patient    Brief HPI:  60 y.o. male with medical history significant for chronic kidney disease on dialysis, known seizure disorder on Keppra, hypertension with LVH, anemia of chronic kidney disease, dyslipidemia, prolonged QT interval on EKG which is chronic, history of polysubstance abuse with prior admissions for alcohol withdrawal, history of cocaine use, history of hepatitis C anybody positive and prior hepatitis B infection, and acute GI bleeding during previous admission August of this year. 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 He was recently discharged on 8/25. He had been admitted due to altered mentation presumed related to polypharmacy.  patient was sent to the ER from dialysis after they noticed altered mentation. He was attempting to verbalize but responses were unintelligible and very soft. He was unable to follow commands but was spontaneously moving all of his extremities. He last completed dialysis on Saturday. In the ER his initial rectal temperature was 95.5, he was hypertensive, not hypoxic but in setting of altered mentation and known dialysis patient at risk for bacterial infections ER treated him as sepsis protocol. He has remained hypertensive and altered with normal lactic acid, no leukocytosis or elevation in neutrophils and no definitive source although blood cultures collected and pending. His other labs are at baseline for him given need for dialysis which is due today. This felt at this juncture patient did not have sepsis etiology to his altered mentation and other etiologies are now being pursued.    Assessment/Plan:    1. Acute encephalopathy- unclear cause, EEG showed no epileptiform discharges. MRI could not be performed earlier, but  MRI done  after giving 2 mg IV Ativan which showed no significant abnormality. Neurontin has been  discontinued by nephrology. Swallow evaluation done patient was started on full liquid diet. Keppra has been discontinued. Patient started on Vimpat. Neurology has been consulted 2. Seizure disorder- EEG negative for any epileptiform discharges, patient has been on Keppra, Keppra level 37.9, Keppra has been discontinued and patient started on Vimpat 3. ? UTI- patient was started on empiric  ceftriaxone, urine culture shows no growth. Discontinue ceftriaxone at this time. 4. ESRD on hemodialysis-patient on Tuesday Thursday Saturday hemodialysis. Nephrology following. Dry weight down 6 kg, patient to be given IV fluids during HD. 5. Prolonged QT interval- chronic,  checked serum magnesium 2.2. Avoid Haldol Zofran and fluoroquinolones. 6. Vomiting- ? Cause, start Phenergan 12.5 mg IV q 8 hr prn.    DVT prophylaxis: heparin Code Status: Full code Family Communication: No family at bedside Disposition Plan: Pending improvement in mental status   Consultants:   Nephrology  Procedures:  EEG  Antibiotics:  Ceftriaxone  Subjective   Patient seen and examined, Mental status is worse. Patient continues to be lethargic.  Objective    Objective: Vitals:   12/27/15 2023 12/27/15 2253 12/28/15 0436 12/28/15 1000  BP: (!) 193/63 (!) 176/62 (!) 169/65 134/73  Pulse: 73  81 93  Resp: 20  16 18   Temp: 98.4 F (36.9 C)  98.5 F (36.9 C) 98.3 F (36.8 C)  TempSrc: Oral  Oral Oral  SpO2: 99%  100% 98%  Weight: 49.8 kg (109 lb 12.6 oz)     Height:        Intake/Output Summary (Last 24 hours) at 12/28/15 1139 Last data filed at 12/28/15 0900  Gross per 24 hour  Intake  518.75 ml  Output             -172 ml  Net           690.75 ml   Filed Weights   12/27/15 0755 12/27/15 1203 12/27/15 2023  Weight: 48.5 kg (106 lb 14.8 oz) 48.2 kg (106 lb 4.2 oz) 49.8 kg (109 lb 12.6 oz)    Examination:  General exam: Appears calm and comfortable  Respiratory system: Clear to  auscultation. Respiratory effort normal. Cardiovascular system: S1 & S2 heard, RRR. No JVD, murmurs, rubs, gallops or clicks. No pedal edema. Gastrointestinal system: Abdomen is nondistended, soft and nontender. No organomegaly or masses felt. Normal bowel sounds heard. Central nervous system: Alert and oriented x 2. No focal neurological deficits. Extremities: Symmetric 5 x 5 power. Skin: No rashes, lesions or ulcers Psychiatry: Judgement and insight appear normal. Mood & affect appropriate.    Data Reviewed: I have personally reviewed following labs and imaging studies Basic Metabolic Panel:  Recent Labs Lab 12/23/15 0814 12/24/15 0511 12/25/15 0913 12/27/15 0811  NA 139 136 141 138  K 4.2 3.5 3.7 4.1  CL 96* 93* 101 98*  CO2 30 24 27 27   GLUCOSE 84 43* 63* 87  BUN 31* 18 26* 18  CREATININE 7.13* 5.03* 6.67* 6.05*  CALCIUM 11.9* 9.7 10.6* 10.5*  MG  --  2.2  --   --   PHOS  --   --  6.6* 6.5*   Liver Function Tests:  Recent Labs Lab 12/23/15 0814 12/24/15 0511 12/25/15 0913 12/27/15 0811  AST 26 25  --   --   ALT 14* 12*  --   --   ALKPHOS 70 64  --   --   BILITOT 0.8 0.9  --   --   PROT 7.4 7.5  --   --   ALBUMIN 3.2* 3.2* 3.0* 2.9*   No results for input(s): LIPASE, AMYLASE in the last 168 hours. No results for input(s): AMMONIA in the last 168 hours. CBC:  Recent Labs Lab 12/23/15 0814 12/24/15 0511 12/25/15 0912 12/27/15 0811  WBC 5.5 6.2 5.8 7.4  NEUTROABS 4.0  --   --   --   HGB 12.4* 12.1* 11.6* 12.2*  HCT 41.2 39.7 38.0* 40.1  MCV 93.8 93.6 92.7 92.0  PLT 190 147* 158 141*   Cardiac Enzymes: No results for input(s): CKTOTAL, CKMB, CKMBINDEX, TROPONINI in the last 168 hours. BNP (last 3 results)  Recent Labs  08/16/15 2256  BNP 157.1*    ProBNP (last 3 results) No results for input(s): PROBNP in the last 8760 hours.  CBG:  Recent Labs Lab 12/27/15 2023 12/28/15 0035 12/28/15 0426 12/28/15 0531 12/28/15 0729  GLUCAP 71 78 69  72 68    Recent Results (from the past 240 hour(s))  Blood Culture (routine x 2)     Status: None (Preliminary result)   Collection Time: 12/23/15  8:16 AM  Result Value Ref Range Status   Specimen Description BLOOD RIGHT FOREARM  Final   Special Requests BOTTLES DRAWN AEROBIC AND ANAEROBIC 10CC  Final   Culture NO GROWTH 4 DAYS  Final   Report Status PENDING  Incomplete  Blood Culture (routine x 2)     Status: None (Preliminary result)   Collection Time: 12/23/15  9:43 AM  Result Value Ref Range Status   Specimen Description BLOOD RIGHT HAND  Final   Special Requests BOTTLES DRAWN AEROBIC AND ANAEROBIC 5CC  Final  Culture NO GROWTH 4 DAYS  Final   Report Status PENDING  Incomplete  Urine culture     Status: None   Collection Time: 12/23/15 10:57 AM  Result Value Ref Range Status   Specimen Description URINE, CATHETERIZED  Final   Special Requests NONE  Final   Culture NO GROWTH  Final   Report Status 12/24/2015 FINAL  Final     Studies: No results found.  Scheduled Meds: . feeding supplement  1 Container Oral BID BM  . feeding supplement (PRO-STAT SUGAR FREE 64)  30 mL Oral BID BM  . heparin  5,000 Units Subcutaneous Q8H  . hydrALAZINE  100 mg Oral Q8H  . labetalol  200 mg Oral Q8H  . lacosamide  100 mg Oral BID  . losartan  100 mg Oral QHS  . sevelamer carbonate  2.4 g Oral TID WC  . sodium chloride flush  3 mL Intravenous Q12H   Continuous Infusions: . dextrose 5 % and 0.45% NaCl 75 mL/hr at 12/28/15 0809       Time spent: 25 min    [facility] Pager [phone]. If 7PM-7AM, please contact night-coverage at [email or web address], Office  [phone]  password [email or web address] 12/28/2015, 11:39 AM  LOS: 5 days

## 2015-12-29 LAB — GLUCOSE, CAPILLARY
GLUCOSE-CAPILLARY: 103 mg/dL — AB (ref 65–99)
GLUCOSE-CAPILLARY: 115 mg/dL — AB (ref 65–99)
GLUCOSE-CAPILLARY: 117 mg/dL — AB (ref 65–99)
Glucose-Capillary: 104 mg/dL — ABNORMAL HIGH (ref 65–99)
Glucose-Capillary: 106 mg/dL — ABNORMAL HIGH (ref 65–99)
Glucose-Capillary: 111 mg/dL — ABNORMAL HIGH (ref 65–99)
Glucose-Capillary: 129 mg/dL — ABNORMAL HIGH (ref 65–99)

## 2015-12-29 LAB — CORTISOL-AM, BLOOD: Cortisol - AM: 16.9 ug/dL (ref 6.7–22.6)

## 2015-12-29 MED ORDER — ATORVASTATIN CALCIUM 40 MG PO TABS
40.0000 mg | ORAL_TABLET | Freq: Every day | ORAL | Status: DC
  Administered 2015-12-29 – 2016-01-01 (×4): 40 mg via ORAL
  Filled 2015-12-29 (×4): qty 1

## 2015-12-29 MED ORDER — ASPIRIN 81 MG PO CHEW
81.0000 mg | CHEWABLE_TABLET | Freq: Every day | ORAL | Status: DC
  Administered 2015-12-29 – 2016-01-01 (×2): 81 mg via ORAL
  Filled 2015-12-29 (×2): qty 1

## 2015-12-29 MED ORDER — RENA-VITE PO TABS
1.0000 | ORAL_TABLET | Freq: Every day | ORAL | Status: DC
  Administered 2015-12-29 – 2015-12-31 (×3): 1 via ORAL
  Filled 2015-12-29 (×3): qty 1

## 2015-12-29 NOTE — Progress Notes (Signed)
Speech Language Pathology Treatment: Dysphagia  Patient Details Name: [name] MRN: [id number] DOB: 1955-12-22 Today's Date: 12/29/2015 Time: [id number] SLP Time Calculation (min) (ACUTE ONLY): 12 min  Assessment / Plan / Recommendation Clinical Impression  MD requested re-assessment at bedside due to change in mentation. Patient alert and responsive this am, following commands and conversing appropriately with clinician. Able to consume pureed solids and thin liquids without overt indication of aspiration, signs of a suspected delayed swallow initiation with liquids however seemingly good airway protection. Patient declined regular texture solids today. Recommend resuming pureed diet, thin liquid with SLP f/u for potential to advance. Caregiver present and educated regarding results of diagnostic treatment.    HPI HPI: 60 year old SNF resident with history of seizures and polysubstance abuse presents for recurrent altered mental status.  Dx uncontrolled HTN, seizure disorder,  abnormal urinalysis, ESRD, protein cal malnutrition.       SLP Plan  Continue with current plan of care     Recommendations  Diet recommendations: Dysphagia 1 (puree);Thin liquid Liquids provided via: Cup;Straw Medication Administration: Whole meds with puree Supervision: Staff to assist with self feeding Compensations: Slow rate;Small sips/bites Postural Changes and/or Swallow Maneuvers: Seated upright 90 degrees             Oral Care Recommendations: Oral care BID Follow up Recommendations: None Plan: Continue with current plan of care     [place], [place] [phone]    Sahory Nordling Meryl 12/29/2015, 10:06 AM

## 2015-12-29 NOTE — Progress Notes (Signed)
[facility] Progress Note   Subjective: More alert today and conversing, caretaker is here who noticed his AMS in the first place at his SNF and brought it to the provider's attention, she says he is much better today  Objective Vitals:   12/28/15 1800 12/28/15 2106 12/29/15 0434 12/29/15 0652  BP: (!) 150/61 (!) 188/68 (!) 190/69 133/77  Pulse:  77 76 74  Resp: 18 16 16    Temp: 98 F (36.7 C) 98.6 F (37 C) 98.9 F (37.2 C)   TempSrc: Oral Oral Oral   SpO2: 98% 100% 95%   Weight:      Height:       Physical Exam General: Chronically ill appearing male-lethargic, NAD Heart: [id number] 2/6 systolic M RRR Lungs: BBS CTA A/P. No WOB Abdomen: Abdomen soft, nontender Extremities:R BKA with staples intact/no drainage. No LLE edema Dialysis Access: LUA AVF +Bruit  Dialysis Orders:[facility] TTS  4h 2K/2.25 Ca 400/auto 1.5 UF Profile2 EDW 56kg LAVF  No Heparin  Hectorol 7 mcg IV q treatment  Mircera 112mcg IV q 2 weeks (last dose 150 mcg IV8/26/17)  OP Labs: Hgb 11.2 Tsat 54% K 4.7 Corr Ca 10.3 iPTH 73  Assessment/Plan: 1. Altered mental status - recurrent issue,w/u per primary -MRI no acute and EEG normal, drug screen negative. Better after 24 hours off of Keppra.  Continue to observe off Keppra 2.  ESRD- T,TH,S at [facility]. HD Tuesday. 3. Anemia- Hgb 12.2- No ESA. Follow HGB 4. Secondary hyperparathyroidism- Ca 10.5 C Ca 11.4 Phos 6.5. Start renvela powder binders, No VDRA.    5.HTN - BP's better back on home meds.  6. Volume- will dc IVF"s, looks better hydrated now.  Remains below dry wt by 6kg, keep even w HD 7. Nutrition- Alb 2.9 Dysphagia 1 diet/thin liquids. Renal vit/supplement. Add prostat.  7. H/O Seizures:on Vimpat now, see #1 above   Kelly Splinter MD [facility] pager [phone]    cell [phone] 12/29/2015, 10:21 AM  Additional Objective Labs: Basic Metabolic Panel:  Recent Labs Lab 12/24/15 0511 12/25/15 0913  12/27/15 0811  NA 136 141 138  K 3.5 3.7 4.1  CL 93* 101 98*  CO2 24 27 27   GLUCOSE 43* 63* 87  BUN 18 26* 18  CREATININE 5.03* 6.67* 6.05*  CALCIUM 9.7 10.6* 10.5*  PHOS  --  6.6* 6.5*   Liver Function Tests:  Recent Labs Lab 12/23/15 0814 12/24/15 0511 12/25/15 0913 12/27/15 0811  AST 26 25  --   --   ALT 14* 12*  --   --   ALKPHOS 70 64  --   --   BILITOT 0.8 0.9  --   --   PROT 7.4 7.5  --   --   ALBUMIN 3.2* 3.2* 3.0* 2.9*   No results for input(s): LIPASE, AMYLASE in the last 168 hours. CBC:  Recent Labs Lab 12/23/15 0814 12/24/15 0511 12/25/15 0912 12/27/15 0811  WBC 5.5 6.2 5.8 7.4  NEUTROABS 4.0  --   --   --   HGB 12.4* 12.1* 11.6* 12.2*  HCT 41.2 39.7 38.0* 40.1  MCV 93.8 93.6 92.7 92.0  PLT 190 147* 158 141*   Blood Culture    Component Value Date/Time   SDES URINE, CATHETERIZED 12/23/2015 1057   SPECREQUEST NONE 12/23/2015 1057   CULT NO GROWTH 12/23/2015 1057   REPTSTATUS 12/24/2015 FINAL 12/23/2015 1057    Cardiac Enzymes: No results for input(s): CKTOTAL, CKMB, CKMBINDEX, TROPONINI in the last  168 hours. CBG:  Recent Labs Lab 12/28/15 1654 12/28/15 2057 12/29/15 0020 12/29/15 0431 12/29/15 0746  GLUCAP 91 104* 115* 103* 129*   Iron Studies: No results for input(s): IRON, TIBC, TRANSFERRIN, FERRITIN in the last 72 hours. @lablastinr3 @ Studies/Results: Dg Chest Port 1v Same Day  Result Date: 12/28/2015 CLINICAL DATA:  Altered mental status, history of hypertension head the enhanced see. EXAM: PORTABLE CHEST 1 VIEW COMPARISON:  12/23/2015 FINDINGS: There is no acute pulmonary infiltrate, consolidation, or pleural effusion. Cardiomediastinal silhouette is stable. No pneumothorax. Faint ovoid opacity in the left mid lung zone may reflect nipple shadow. There are vascular calcifications over the right neck. Marked degenerative changes of the right shoulder. IMPRESSION: 1. No acute infiltrate or overt pulmonary edema 2. Presumed vascular  calcifications in the region of right carotid artery. Electronically Signed   By: Donavan Foil M.D.   On: 12/28/2015 10:40   Medications: . dextrose 5 % and 0.45% NaCl 75 mL/hr at 12/28/15 2214   . aspirin  81 mg Oral Daily  . atorvastatin  40 mg Oral q1800  . feeding supplement  1 Container Oral BID BM  . feeding supplement (PRO-STAT SUGAR FREE 64)  30 mL Oral BID BM  . heparin  5,000 Units Subcutaneous Q8H  . hydrALAZINE  100 mg Oral Q8H  . labetalol  200 mg Oral Q8H  . lacosamide  100 mg Oral BID  . losartan  100 mg Oral QHS  . multivitamin  1 tablet Oral QHS  . sevelamer carbonate  2.4 g Oral TID WC  . sodium chloride flush  3 mL Intravenous Q12H

## 2015-12-29 NOTE — Progress Notes (Signed)
[facility]  PROGRESS NOTE  [name] [id number] DOB: [birth year] DOA: 12/23/2015 PCP: No PCP Per Patient    Brief HPI:  60 y.o. male with medical history significant for chronic kidney disease on dialysis, known seizure disorder on Keppra, hypertension with LVH, anemia of chronic kidney disease, dyslipidemia, prolonged QT interval on EKG which is chronic, history of polysubstance abuse with prior admissions for alcohol withdrawal, history of cocaine use, history of hepatitis C anybody positive and prior hepatitis B infection, and acute GI bleeding during previous admission August of this year. 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 He was recently discharged on 8/25. He had been admitted due to altered mentation presumed related to polypharmacy.  patient was sent to the ER from dialysis after they noticed altered mentation. He was attempting to verbalize but responses were unintelligible and very soft. He was unable to follow commands but was spontaneously moving all of his extremities. He last completed dialysis on Saturday. In the ER his initial rectal temperature was 95.5, he was hypertensive, not hypoxic but in setting of altered mentation and known dialysis patient at risk for bacterial infections ER treated him as sepsis protocol. He has remained hypertensive and altered with normal lactic acid, no leukocytosis or elevation in neutrophils and no definitive source although blood cultures collected and pending. His other labs are at baseline for him given need for dialysis which is due today. This felt at this juncture patient did not have sepsis etiology to his altered mentation and other etiologies are now being pursued.    Assessment/Plan:    1. Acute encephalopathy- Improving after stopping Keppra, EEG showed no epileptiform discharges. MRI could not be performed earlier, but  MRI done  after giving 2 mg IV Ativan which showed no significant abnormality.  Neurontin has been discontinued by nephrology. Swallow evaluation done patient was started on full liquid diet. Keppra has been discontinued. Patient started on Vimpat. Neurology following. 2. Seizure disorder- EEG negative for any epileptiform discharges, patient has been on Keppra, Keppra level 37.9, Keppra has been discontinued and patient started on Vimpat 3. ? UTI- patient was started on empiric  ceftriaxone, urine culture shows no growth. Discontinued ceftriaxone at this time. 4. ESRD on hemodialysis-patient on Tuesday Thursday Saturday hemodialysis. Nephrology following. Dry weight down 6 kg, patient to be given IV fluids during HD. 5. Prolonged QT interval- chronic,  checked serum magnesium 2.2. Avoid Haldol Zofran and fluoroquinolones. 6. Vomiting- ? Cause, start Phenergan 12.5 mg IV q 8 hr prn.    DVT prophylaxis: heparin Code Status: Full code Family Communication: No family at bedside Disposition Plan: Pending improvement in mental status   Consultants:   Nephrology  Procedures:  EEG  Antibiotics:  Ceftriaxone  Subjective   Patient seen and examined, Mental status has improved. Answering questions appropriately.  Objective    Objective: Vitals:   12/28/15 2106 12/29/15 0434 12/29/15 0652 12/29/15 1027  BP: (!) 188/68 (!) 190/69 133/77 (!) 163/66  Pulse: 77 76 74 72  Resp: 16 16  18   Temp: 98.6 F (37 C) 98.9 F (37.2 C)  98.5 F (36.9 C)  TempSrc: Oral Oral  Oral  SpO2: 100% 95%  94%  Weight:      Height:        Intake/Output Summary (Last 24 hours) at 12/29/15 1349 Last data filed at 12/29/15 1028  Gross per 24 hour  Intake             1575 ml  Output                0 ml  Net             1575 ml   Filed Weights   12/27/15 0755 12/27/15 1203 12/27/15 2023  Weight: 48.5 kg (106 lb 14.8 oz) 48.2 kg (106 lb 4.2 oz) 49.8 kg (109 lb 12.6 oz)    Examination:  General exam: Appears calm and comfortable  Respiratory system: Clear to auscultation.  Respiratory effort normal. Cardiovascular system: S1 & S2 heard, RRR. No JVD, murmurs, rubs, gallops or clicks. No pedal edema. Gastrointestinal system: Abdomen is nondistended, soft and nontender. No organomegaly or masses felt. Normal bowel sounds heard. Central nervous system: Alert and oriented x 3. No focal neurological deficits. Extremities: Symmetric 5 x 5 power. Skin: No rashes, lesions or ulcers Psychiatry: Judgement and insight appear normal. Mood & affect appropriate.    Data Reviewed: I have personally reviewed following labs and imaging studies Basic Metabolic Panel:  Recent Labs Lab 12/23/15 0814 12/24/15 0511 12/25/15 0913 12/27/15 0811  NA 139 136 141 138  K 4.2 3.5 3.7 4.1  CL 96* 93* 101 98*  CO2 30 24 27 27   GLUCOSE 84 43* 63* 87  BUN 31* 18 26* 18  CREATININE 7.13* 5.03* 6.67* 6.05*  CALCIUM 11.9* 9.7 10.6* 10.5*  MG  --  2.2  --   --   PHOS  --   --  6.6* 6.5*   Liver Function Tests:  Recent Labs Lab 12/23/15 0814 12/24/15 0511 12/25/15 0913 12/27/15 0811  AST 26 25  --   --   ALT 14* 12*  --   --   ALKPHOS 70 64  --   --   BILITOT 0.8 0.9  --   --   PROT 7.4 7.5  --   --   ALBUMIN 3.2* 3.2* 3.0* 2.9*   No results for input(s): LIPASE, AMYLASE in the last 168 hours.  Recent Labs Lab 12/28/15 1215  AMMONIA 45*   CBC:  Recent Labs Lab 12/23/15 0814 12/24/15 0511 12/25/15 0912 12/27/15 0811  WBC 5.5 6.2 5.8 7.4  NEUTROABS 4.0  --   --   --   HGB 12.4* 12.1* 11.6* 12.2*  HCT 41.2 39.7 38.0* 40.1  MCV 93.8 93.6 92.7 92.0  PLT 190 147* 158 141*   Cardiac Enzymes: No results for input(s): CKTOTAL, CKMB, CKMBINDEX, TROPONINI in the last 168 hours. BNP (last 3 results)  Recent Labs  08/16/15 2256  BNP 157.1*    ProBNP (last 3 results) No results for input(s): PROBNP in the last 8760 hours.  CBG:  Recent Labs Lab 12/28/15 2057 12/29/15 0020 12/29/15 0431 12/29/15 0746 12/29/15 1140  GLUCAP 104* 115* 103* 129* 106*     Recent Results (from the past 240 hour(s))  Blood Culture (routine x 2)     Status: None   Collection Time: 12/23/15  8:16 AM  Result Value Ref Range Status   Specimen Description BLOOD RIGHT FOREARM  Final   Special Requests BOTTLES DRAWN AEROBIC AND ANAEROBIC 10CC  Final   Culture NO GROWTH 5 DAYS  Final   Report Status 12/28/2015 FINAL  Final  Blood Culture (routine x 2)     Status: None   Collection Time: 12/23/15  9:43 AM  Result Value Ref Range Status   Specimen Description BLOOD RIGHT HAND  Final   Special Requests BOTTLES DRAWN AEROBIC AND ANAEROBIC 5CC  Final  Culture NO GROWTH 5 DAYS  Final   Report Status 12/28/2015 FINAL  Final  Urine culture     Status: None   Collection Time: 12/23/15 10:57 AM  Result Value Ref Range Status   Specimen Description URINE, CATHETERIZED  Final   Special Requests NONE  Final   Culture NO GROWTH  Final   Report Status 12/24/2015 FINAL  Final     Studies: Dg Chest Port 1v Same Day  Result Date: 12/28/2015 CLINICAL DATA:  Altered mental status, history of hypertension head the enhanced see. EXAM: PORTABLE CHEST 1 VIEW COMPARISON:  12/23/2015 FINDINGS: There is no acute pulmonary infiltrate, consolidation, or pleural effusion. Cardiomediastinal silhouette is stable. No pneumothorax. Faint ovoid opacity in the left mid lung zone may reflect nipple shadow. There are vascular calcifications over the right neck. Marked degenerative changes of the right shoulder. IMPRESSION: 1. No acute infiltrate or overt pulmonary edema 2. Presumed vascular calcifications in the region of right carotid artery. Electronically Signed   By: Donavan Foil M.D.   On: 12/28/2015 10:40    Scheduled Meds: . aspirin  81 mg Oral Daily  . atorvastatin  40 mg Oral q1800  . feeding supplement  1 Container Oral BID BM  . feeding supplement (PRO-STAT SUGAR FREE 64)  30 mL Oral BID BM  . heparin  5,000 Units Subcutaneous Q8H  . hydrALAZINE  100 mg Oral Q8H  . labetalol   200 mg Oral Q8H  . lacosamide  100 mg Oral BID  . losartan  100 mg Oral QHS  . multivitamin  1 tablet Oral QHS  . sevelamer carbonate  2.4 g Oral TID WC  . sodium chloride flush  3 mL Intravenous Q12H   Continuous Infusions: . dextrose 5 % and 0.45% NaCl 75 mL/hr at 12/29/15 1242   Time spent: 25 min    [facility] Pager [phone]. If 7PM-7AM, please contact night-coverage at [email or web address], Office  [phone]  password [email or web address] 12/29/2015, 1:49 PM  LOS: 6 days

## 2015-12-30 LAB — GLUCOSE, CAPILLARY
GLUCOSE-CAPILLARY: 112 mg/dL — AB (ref 65–99)
GLUCOSE-CAPILLARY: 75 mg/dL (ref 65–99)
Glucose-Capillary: 105 mg/dL — ABNORMAL HIGH (ref 65–99)
Glucose-Capillary: 77 mg/dL (ref 65–99)
Glucose-Capillary: 82 mg/dL (ref 65–99)
Glucose-Capillary: 82 mg/dL (ref 65–99)

## 2015-12-30 LAB — CBC WITH DIFFERENTIAL/PLATELET
Basophils Absolute: 0 10*3/uL (ref 0.0–0.1)
Basophils Relative: 0 %
EOS ABS: 0.2 10*3/uL (ref 0.0–0.7)
EOS PCT: 5 %
HCT: 35.1 % — ABNORMAL LOW (ref 39.0–52.0)
HEMOGLOBIN: 10.9 g/dL — AB (ref 13.0–17.0)
LYMPHS ABS: 0.5 10*3/uL — AB (ref 0.7–4.0)
Lymphocytes Relative: 14 %
MCH: 28.3 pg (ref 26.0–34.0)
MCHC: 31.1 g/dL (ref 30.0–36.0)
MCV: 91.2 fL (ref 78.0–100.0)
MONOS PCT: 13 %
Monocytes Absolute: 0.5 10*3/uL (ref 0.1–1.0)
NEUTROS PCT: 68 %
Neutro Abs: 2.5 10*3/uL (ref 1.7–7.7)
Platelets: 132 10*3/uL — ABNORMAL LOW (ref 150–400)
RBC: 3.85 MIL/uL — ABNORMAL LOW (ref 4.22–5.81)
RDW: 15.5 % (ref 11.5–15.5)
WBC: 3.7 10*3/uL — ABNORMAL LOW (ref 4.0–10.5)

## 2015-12-30 LAB — RENAL FUNCTION PANEL
Albumin: 2.7 g/dL — ABNORMAL LOW (ref 3.5–5.0)
Anion gap: 11 (ref 5–15)
BUN: 19 mg/dL (ref 6–20)
CALCIUM: 9.4 mg/dL (ref 8.9–10.3)
CO2: 27 mmol/L (ref 22–32)
CREATININE: 5.88 mg/dL — AB (ref 0.61–1.24)
Chloride: 96 mmol/L — ABNORMAL LOW (ref 101–111)
GFR calc Af Amer: 11 mL/min — ABNORMAL LOW (ref >60)
GFR calc non Af Amer: 10 mL/min — ABNORMAL LOW (ref >60)
GLUCOSE: 106 mg/dL — AB (ref 65–99)
Phosphorus: 5.3 mg/dL — ABNORMAL HIGH (ref 2.5–4.6)
Potassium: 3.7 mmol/L (ref 3.5–5.1)
SODIUM: 134 mmol/L — AB (ref 135–145)

## 2015-12-30 MED ORDER — HEPARIN SODIUM (PORCINE) 1000 UNIT/ML DIALYSIS: 1000.0000 [IU] | INTRAMUSCULAR | Status: DC | PRN

## 2015-12-30 MED ORDER — ALTEPLASE 2 MG IJ SOLR: 2.0000 mg | Freq: Once | INTRAMUSCULAR | Status: DC | PRN

## 2015-12-30 MED ORDER — PENTAFLUOROPROP-TETRAFLUOROETH EX AERO: 1.0000 "application " | INHALATION_SPRAY | CUTANEOUS | Status: DC | PRN

## 2015-12-30 MED ORDER — SODIUM CHLORIDE 0.9 % IV SOLN: 100.0000 mL | INTRAVENOUS | Status: DC | PRN

## 2015-12-30 MED ORDER — LIDOCAINE HCL (PF) 1 % IJ SOLN: 5.0000 mL | INTRAMUSCULAR | Status: DC | PRN

## 2015-12-30 MED ORDER — LIDOCAINE-PRILOCAINE 2.5-2.5 % EX CREA: 1.0000 "application " | TOPICAL_CREAM | CUTANEOUS | Status: DC | PRN

## 2015-12-30 NOTE — Progress Notes (Addendum)
Subjective: Lethargic. He had improved some yesterday.   Exam: Vitals:   12/30/15 1225 12/30/15 1718  BP: (!) 164/80 (!) 191/68  Pulse: 70 76  Resp: 20 18  Temp: 98.6 F (37 C) 98.5 F (36.9 C)   Gen: In bed, NAD Resp: non-labored breathing, no acute distress Abd: soft, nt  Neuro: MS: awakens to mild stimuli, oriented to persone, place, but not very interactive.  [id number], VFF Motor: MAEW Sensory:intact to LT  Pertinent Labs: Calcium normal  Impression: 60 yo M with lethargy of unclear cuase. I suspect multifactorial delirium, but no definite instigating factor. Will see if his MS improves following dialysis. I do wonder if hypertensive encephalopathy could be playing a role.   Recommendations: 1) BP control. 2) will continue to follow.   Roland Rack, MD [facility] [phone]  If 7pm- 7am, please page neurology on call as listed in [place].

## 2015-12-30 NOTE — Care Management Important Message (Signed)
Important Message  Patient Details  Name: [name] MRN: [id number] Date of Birth: 12-14-1955   Medicare Important Message Given:  Yes    Orbie Pyo 12/30/2015, 1:06 PM

## 2015-12-30 NOTE — Progress Notes (Signed)
[facility]  PROGRESS NOTE  [name] [id number] DOB: 02/09/1956 DOA: 12/23/2015 PCP: No PCP Per Patient    Brief HPI:  60 y.o. male with medical history significant for chronic kidney disease on dialysis, known seizure disorder on Keppra, hypertension with LVH, anemia of chronic kidney disease, dyslipidemia, prolonged QT interval on EKG which is chronic, history of polysubstance abuse with prior admissions for alcohol withdrawal, history of cocaine use, history of hepatitis C anybody positive and prior hepatitis B infection, and acute GI bleeding during previous admission August of this year. 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 He was recently discharged on 8/25. He had been admitted due to altered mentation presumed related to polypharmacy.  patient was sent to the ER from dialysis after they noticed altered mentation. He was attempting to verbalize but responses were unintelligible and very soft. He was unable to follow commands but was spontaneously moving all of his extremities. He last completed dialysis on Saturday. In the ER his initial rectal temperature was 95.5, he was hypertensive, not hypoxic but in setting of altered mentation and known dialysis patient at risk for bacterial infections ER treated him as sepsis protocol. He has remained hypertensive and altered with normal lactic acid, no leukocytosis or elevation in neutrophils and no definitive source although blood cultures collected and pending. His other labs are at baseline for him given need for dialysis which is due today. This felt at this juncture patient did not have sepsis etiology to his altered mentation and other etiologies are now being pursued.    Assessment/Plan:    1. Acute encephalopathy-Fluctuating mental status, was Improving after stopping Keppra, but again today very lethargic. EEG showed no epileptiform discharges. MRI could not be performed earlier, but  MRI done  after giving  2 mg IV Ativan which showed no significant abnormality. Neurontin has been discontinued by nephrology. Swallow evaluation done patient was started on full liquid diet. Keppra has been discontinued. Patient started on Vimpat. Neurology following. Chest x-ray negative for pneumonia 2. Seizure disorder- EEG negative for any epileptiform discharges, patient has been on Keppra, Keppra level 37.9, Keppra has been discontinued and patient started on Vimpat. 3. ? UTI- patient was started on empiric  ceftriaxone, urine culture shows no growth. Discontinued ceftriaxone. 4. ESRD on hemodialysis-patient on Tuesday Thursday Saturday hemodialysis. Nephrology following. Dry weight down 6 kg, patient  given IV fluids during HD. 5. Prolonged QT interval- chronic,  checked serum magnesium 2.2. Avoid Haldol Zofran and fluoroquinolones. 6. Vomiting- ? Cause, start Phenergan 12.5 mg IV q 8 hr prn. Will check CBC today, that there was a question of coffee-ground emesis.     DVT prophylaxis: heparin Code Status: Full code Family Communication: No family at bedside Disposition Plan: Pending improvement in mental status   Consultants:   Nephrology  Procedures:  EEG  Antibiotics:  Ceftriaxone  Subjective   Patient seen and examined, Again lethargic this morning.  Objective    Objective: Vitals:   12/30/15 1100 12/30/15 1130 12/30/15 1204 12/30/15 1225  BP: (!) 158/80 (!) 150/80 (!) 160/84 (!) 164/80  Pulse: 68 68 70 70  Resp:   18 20  Temp:   98.6 F (37 C) 98.6 F (37 C)  TempSrc:   Oral Oral  SpO2:   95%   Weight:   51 kg (112 lb 7 oz)   Height:        Intake/Output Summary (Last 24 hours) at 12/30/15 1626 Last data filed at 12/30/15  1623  Gross per 24 hour  Intake                0 ml  Output             1000 ml  Net            -1000 ml   Filed Weights   12/27/15 2023 12/30/15 0825 12/30/15 1204  Weight: 49.8 kg (109 lb 12.6 oz) 52 kg (114 lb 10.2 oz) 51 kg (112 lb 7 oz)     Examination:  General exam: Appears calm and comfortable  Respiratory system: Clear to auscultation. Respiratory effort normal. Cardiovascular system: S1 & S2 heard, RRR. No JVD, murmurs, rubs, gallops or clicks. No pedal edema. Gastrointestinal system: Abdomen is nondistended, soft and nontender. No organomegaly or masses felt. Normal bowel sounds heard. Central nervous system: Alert and oriented x 1. No focal neurological deficits. Extremities: Symmetric 5 x 5 power. Skin: No rashes, lesions or ulcers Psychiatry: Judgement and insight appear normal. Mood & affect appropriate.    Data Reviewed: I have personally reviewed following labs and imaging studies Basic Metabolic Panel:  Recent Labs Lab 12/24/15 0511 12/25/15 0913 12/27/15 0811 12/30/15 0833  NA 136 141 138 134*  K 3.5 3.7 4.1 3.7  CL 93* 101 98* 96*  CO2 24 27 27 27   GLUCOSE 43* 63* 87 106*  BUN 18 26* 18 19  CREATININE 5.03* 6.67* 6.05* 5.88*  CALCIUM 9.7 10.6* 10.5* 9.4  MG 2.2  --   --   --   PHOS  --  6.6* 6.5* 5.3*   Liver Function Tests:  Recent Labs Lab 12/24/15 0511 12/25/15 0913 12/27/15 0811 12/30/15 0833  AST 25  --   --   --   ALT 12*  --   --   --   ALKPHOS 64  --   --   --   BILITOT 0.9  --   --   --   PROT 7.5  --   --   --   ALBUMIN 3.2* 3.0* 2.9* 2.7*   No results for input(s): LIPASE, AMYLASE in the last 168 hours.  Recent Labs Lab 12/28/15 1215  AMMONIA 45*   CBC:  Recent Labs Lab 12/24/15 0511 12/25/15 0912 12/27/15 0811  WBC 6.2 5.8 7.4  HGB 12.1* 11.6* 12.2*  HCT 39.7 38.0* 40.1  MCV 93.6 92.7 92.0  PLT 147* 158 141*   Cardiac Enzymes: No results for input(s): CKTOTAL, CKMB, CKMBINDEX, TROPONINI in the last 168 hours. BNP (last 3 results)  Recent Labs  08/16/15 2256  BNP 157.1*    ProBNP (last 3 results) No results for input(s): PROBNP in the last 8760 hours.  CBG:  Recent Labs Lab 12/29/15 2012 12/29/15 2351 12/30/15 0431 12/30/15 0741  12/30/15 1146  GLUCAP 117* 104* 105* 112* 82    Recent Results (from the past 240 hour(s))  Blood Culture (routine x 2)     Status: None   Collection Time: 12/23/15  8:16 AM  Result Value Ref Range Status   Specimen Description BLOOD RIGHT FOREARM  Final   Special Requests BOTTLES DRAWN AEROBIC AND ANAEROBIC 10CC  Final   Culture NO GROWTH 5 DAYS  Final   Report Status 12/28/2015 FINAL  Final  Blood Culture (routine x 2)     Status: None   Collection Time: 12/23/15  9:43 AM  Result Value Ref Range Status   Specimen Description BLOOD RIGHT HAND  Final   Special  Requests BOTTLES DRAWN AEROBIC AND ANAEROBIC 5CC  Final   Culture NO GROWTH 5 DAYS  Final   Report Status 12/28/2015 FINAL  Final  Urine culture     Status: None   Collection Time: 12/23/15 10:57 AM  Result Value Ref Range Status   Specimen Description URINE, CATHETERIZED  Final   Special Requests NONE  Final   Culture NO GROWTH  Final   Report Status 12/24/2015 FINAL  Final     Studies: No results found.  Scheduled Meds: . aspirin  81 mg Oral Daily  . atorvastatin  40 mg Oral q1800  . feeding supplement  1 Container Oral BID BM  . feeding supplement (PRO-STAT SUGAR FREE 64)  30 mL Oral BID BM  . heparin  5,000 Units Subcutaneous Q8H  . hydrALAZINE  100 mg Oral Q8H  . labetalol  200 mg Oral Q8H  . lacosamide  100 mg Oral BID  . losartan  100 mg Oral QHS  . multivitamin  1 tablet Oral QHS  . sevelamer carbonate  2.4 g Oral TID WC  . sodium chloride flush  3 mL Intravenous Q12H   Continuous Infusions:   Time spent: 25 min    [facility] Pager [phone]. If 7PM-7AM, please contact night-coverage at [email or web address], Office  [phone]  password [email or web address] 12/30/2015, 4:26 PM  LOS: 7 days

## 2015-12-30 NOTE — Procedures (Signed)
Patient was seen on dialysis and the procedure was supervised.  BFR 400  Via AVF BP is  174/84.   Patient appears to be tolerating treatment well  [name] A 12/30/2015

## 2015-12-30 NOTE — Progress Notes (Signed)
[facility] Progress Note   Subjective: Seen on HD- very lethargic to my exam  Objective Vitals:   12/30/15 0825 12/30/15 0830 12/30/15 0900 12/30/15 0930  BP: (!) 188/81 (!) 180/80 (!) 143/69 (!) 174/84  Pulse: 68 68 69 70  Resp: 18 18 18    Temp: 98.2 F (36.8 C)     TempSrc: Oral     SpO2: 95%     Weight: 52 kg (114 lb 10.2 oz)     Height:       Physical Exam General: Chronically ill appearing male-lethargic, NAD Heart: [id number] 2/6 systolic M RRR Lungs: BBS CTA A/P. No WOB Abdomen: Abdomen soft, nontender Extremities:R BKA with staples intact/no drainage. No LLE edema Dialysis Access: LUA AVF +Bruit  Dialysis Orders:[facility] TTS  4h 2K/2.25 Ca 400/auto 1.5 UF Profile2 EDW 56kg LAVF  No Heparin  Hectorol 7 mcg IV q treatment  Mircera 12mcg IV q 2 weeks (last dose 150 mcg IV8/26/17)  OP Labs: Hgb 11.2 Tsat 54% K 4.7 Corr Ca 10.3 iPTH 73  Assessment/Plan: 1. Altered mental status - recurrent issue,w/u per primary -MRI no acute and EEG normal, drug screen negative. Better ? after 24 hours off of Keppra.  Continue to observe off Keppra, on vimpat- seems very lethargic today  2.  ESRD- T,TH,S at [facility]. HD today 3. Anemia- Hgb 12.2- No ESA. Follow HGB 4. Secondary hyperparathyroidism- Ca 10.5 C Ca 11.4 Phos 6.5. Start renvela powder binders, No VDRA.  Calc has come down some as well as phos  5.HTN - BP's better back on home meds. Is currently 4 kg under EDW which was already low 6. Volume- will dc IVF"s, looks better hydrated now.  Remains below dry wt by 4kg, minimal UF with HD - stop IVF  7. Nutrition- Alb 2.9 Dysphagia 1 diet/thin liquids. Renal vit/supplement. Add prostat.  7. H/O Seizures:on Vimpat now, see #1 above 8. Dispo- consider a palliative care consult    Virgia Kelner A 12/29/2015, 10:21 AM  Additional Objective Labs: Basic Metabolic Panel:  Recent Labs Lab 12/25/15 0913 12/27/15 0811 12/30/15 0833  NA 141 138 134*   K 3.7 4.1 3.7  CL 101 98* 96*  CO2 27 27 27   GLUCOSE 63* 87 106*  BUN 26* 18 19  CREATININE 6.67* 6.05* 5.88*  CALCIUM 10.6* 10.5* 9.4  PHOS 6.6* 6.5* 5.3*   Liver Function Tests:  Recent Labs Lab 12/24/15 0511 12/25/15 0913 12/27/15 0811 12/30/15 0833  AST 25  --   --   --   ALT 12*  --   --   --   ALKPHOS 64  --   --   --   BILITOT 0.9  --   --   --   PROT 7.5  --   --   --   ALBUMIN 3.2* 3.0* 2.9* 2.7*   No results for input(s): LIPASE, AMYLASE in the last 168 hours. CBC:  Recent Labs Lab 12/24/15 0511 12/25/15 0912 12/27/15 0811  WBC 6.2 5.8 7.4  HGB 12.1* 11.6* 12.2*  HCT 39.7 38.0* 40.1  MCV 93.6 92.7 92.0  PLT 147* 158 141*   Blood Culture    Component Value Date/Time   SDES URINE, CATHETERIZED 12/23/2015 1057   SPECREQUEST NONE 12/23/2015 1057   CULT NO GROWTH 12/23/2015 1057   REPTSTATUS 12/24/2015 FINAL 12/23/2015 1057    Cardiac Enzymes: No results for input(s): CKTOTAL, CKMB, CKMBINDEX, TROPONINI in the last 168 hours. CBG:  Recent Labs Lab 12/29/15 1659 12/29/15  2012 12/29/15 2351 12/30/15 0431 12/30/15 0741  GLUCAP 111* 117* 104* 105* 112*   Iron Studies: No results for input(s): IRON, TIBC, TRANSFERRIN, FERRITIN in the last 72 hours. @lablastinr3 @ Studies/Results: Dg Chest Port 1v Same Day  Result Date: 12/28/2015 CLINICAL DATA:  Altered mental status, history of hypertension head the enhanced see. EXAM: PORTABLE CHEST 1 VIEW COMPARISON:  12/23/2015 FINDINGS: There is no acute pulmonary infiltrate, consolidation, or pleural effusion. Cardiomediastinal silhouette is stable. No pneumothorax. Faint ovoid opacity in the left mid lung zone may reflect nipple shadow. There are vascular calcifications over the right neck. Marked degenerative changes of the right shoulder. IMPRESSION: 1. No acute infiltrate or overt pulmonary edema 2. Presumed vascular calcifications in the region of right carotid artery. Electronically Signed   By: Donavan Foil M.D.   On: 12/28/2015 10:40   Medications:   . aspirin  81 mg Oral Daily  . atorvastatin  40 mg Oral q1800  . feeding supplement  1 Container Oral BID BM  . feeding supplement (PRO-STAT SUGAR FREE 64)  30 mL Oral BID BM  . heparin  5,000 Units Subcutaneous Q8H  . hydrALAZINE  100 mg Oral Q8H  . labetalol  200 mg Oral Q8H  . lacosamide  100 mg Oral BID  . losartan  100 mg Oral QHS  . multivitamin  1 tablet Oral QHS  . sevelamer carbonate  2.4 g Oral TID WC  . sodium chloride flush  3 mL Intravenous Q12H

## 2015-12-30 NOTE — Progress Notes (Signed)
SLP Cancellation Note  Patient Details Name: [name] MRN: [id number] DOB: [birth year]   Cancelled treatment:       Reason Eval/Treat Not Completed: Medical issues which prohibited therapy; Pt NPO due to recurring vomiting.    [name] 12/30/2015, 2:37 PM

## 2015-12-31 DIAGNOSIS — E785 Hyperlipidemia, unspecified: Secondary | ICD-10-CM

## 2015-12-31 DIAGNOSIS — N186 End stage renal disease: Secondary | ICD-10-CM

## 2015-12-31 DIAGNOSIS — Z992 Dependence on renal dialysis: Secondary | ICD-10-CM

## 2015-12-31 DIAGNOSIS — D631 Anemia in chronic kidney disease: Secondary | ICD-10-CM

## 2015-12-31 DIAGNOSIS — N185 Chronic kidney disease, stage 5: Secondary | ICD-10-CM

## 2015-12-31 LAB — GLUCOSE, CAPILLARY
GLUCOSE-CAPILLARY: 92 mg/dL (ref 65–99)
GLUCOSE-CAPILLARY: 79 mg/dL (ref 65–99)
Glucose-Capillary: 79 mg/dL (ref 65–99)
Glucose-Capillary: 86 mg/dL (ref 65–99)
Glucose-Capillary: 81 mg/dL (ref 65–99)

## 2015-12-31 NOTE — Clinical Social Work Note (Signed)
CSW aware that patient is from [facility]. RNCM notified that patient needs PT eval prior to returning.  Dayton Scrape, [place]

## 2015-12-31 NOTE — Care Management Note (Signed)
Case Management Note  Patient Details  Name: Phillipp Wiley MRN: [id number] Date of Birth: [birth year]  Subjective/Objective:   CM following for progression and d/c planning.                  Action/Plan: 12/31/2015 Per report this pt continued to have some vomiting last pm, per report no drop in Hgb noted, vomitus not checked for blood.  CSW notified this CM that this pt will require PT/OT eval in order to return to SNF. This CM asked charge nurse to order PT/OT eval, Carlyn Reichert will enter orders, noted MD referenced need for PT/OT however no order noted.  This CM also alert pt RN of need for PT/OT and to progress this pt.   Expected Discharge Date:  01/01/2016             Expected Discharge Plan:  Skilled Nursing Facility  In-House Referral:  Clinical Social Work  Discharge planning Services  CM Consult  Post Acute Care Choice:    Choice offered to:     DME Arranged:    DME Agency:     HH Arranged:    [place] Agency:     Status of Service:  In process, will continue to follow  If discussed at Long Length of Stay Meetings, dates discussed:    Additional Comments:  Adron Bene, RN 12/31/2015, 2:23 PM

## 2015-12-31 NOTE — Progress Notes (Signed)
SLP Cancellation Note  Patient Details Name: Piere Sumsion MRN: [id number] DOB: 1955-09-29   Cancelled treatment:       Reason Eval/Treat Not Completed: Medical issues which prohibited therapy - remains NPO.  [name] 12/31/2015, 12:03 PM

## 2015-12-31 NOTE — Progress Notes (Signed)
[facility] Progress Note   Subjective:  "I'm at [place]..I think it's Tuesday". Opens eyes to verbal stimuli, responds slowly to verbal stimuli.  No C/Os. Follows commands.  Now NPO.  Caregiver at bedside   Objective Vitals:   12/30/15 2214 12/31/15 0423 12/31/15 0635 12/31/15 0649  BP: (!) 181/75 (!) 180/57 (!) 190/67 (!) 144/121  Pulse: 75 76 76   Resp:  18    Temp:  98.3 F (36.8 C)    TempSrc:      SpO2:  95%    Weight: 49.4 kg (109 lb)     Height:       Physical Exam General: Chronically ill appearing male-more alert today. NAD Heart: [id number] 2/6 systolic M RRR Lungs: BBS CTA A/P. No WOB Abdomen: Abdomen soft, nontender Extremities:R BKA with staples intact/no drainage. No LLE edema Dialysis Access: LUA AVF +Bruit  Dialysis Orders:[facility] TTS  4h 2K/2.25 Ca 400/auto 1.5 UF Profile2 EDW 56kg LAVF  No Heparin  Hectorol 7 mcg IV q treatment  Mircera 148mcg IV q 2 weeks (last dose 150 mcg IV8/26/17)  OP Labs: Hgb 11.2 Tsat 54% K 4.7 Corr Ca 10.3 iPTH 73 Additional Objective Labs: Basic Metabolic Panel:  Recent Labs Lab 12/25/15 0913 12/27/15 0811 12/30/15 0833  NA 141 138 134*  K 3.7 4.1 3.7  CL 101 98* 96*  CO2 27 27 27   GLUCOSE 63* 87 106*  BUN 26* 18 19  CREATININE 6.67* 6.05* 5.88*  CALCIUM 10.6* 10.5* 9.4  PHOS 6.6* 6.5* 5.3*   Liver Function Tests:  Recent Labs Lab 12/25/15 0913 12/27/15 0811 12/30/15 0833  ALBUMIN 3.0* 2.9* 2.7*   CBC:  Recent Labs Lab 12/25/15 0912 12/27/15 0811 12/30/15 1652  WBC 5.8 7.4 3.7*  NEUTROABS  --   --  2.5  HGB 11.6* 12.2* 10.9*  HCT 38.0* 40.1 35.1*  MCV 92.7 92.0 91.2  PLT 158 141* 132*   Blood Culture    Component Value Date/Time   SDES URINE, CATHETERIZED 12/23/2015 1057   SPECREQUEST NONE 12/23/2015 1057   CULT NO GROWTH 12/23/2015 1057   REPTSTATUS 12/24/2015 FINAL 12/23/2015 1057    CBG:  Recent Labs Lab 12/30/15 1639 12/30/15 2020 12/30/15 2355  12/31/15 0401 12/31/15 0727  GLUCAP 75 82 77 79 92   Iron Studies: No results for input(s): IRON, TIBC, TRANSFERRIN, FERRITIN in the last 72 hours. @lablastinr3 @ Studies/Results: No results found. Medications:   . aspirin  81 mg Oral Daily  . atorvastatin  40 mg Oral q1800  . feeding supplement  1 Container Oral BID BM  . feeding supplement (PRO-STAT SUGAR FREE 64)  30 mL Oral BID BM  . heparin  5,000 Units Subcutaneous Q8H  . hydrALAZINE  100 mg Oral Q8H  . labetalol  200 mg Oral Q8H  . lacosamide  100 mg Oral BID  . losartan  100 mg Oral QHS  . multivitamin  1 tablet Oral QHS  . sevelamer carbonate  2.4 g Oral TID WC  . sodium chloride flush  3 mL Intravenous Q12H     Assessment/Plan: 1. Altered mental status - recurrent issue,w/u per primary -MRI no acute and EEG normal, drug screen negative. Better ? after 24 hours off of Keppra.  Continue to observe off Keppra, on vimpat- actually more responsive today. May be improving.  2.  ESRD- T,TH,S at [facility].Next HD 01/01/16. K+ 3.7 4.0 K bath/2.25 Ca bath.  3. Anemia- Hgb down 10.9- No ESA yet. Follow HGB 4.  Secondary hyperparathyroidism- Ca 9.4C Ca 11.4 Phos 5.3. Start renvela powder binders, No VDRA.  5.HTN - BP's better back on home meds. Is on pretty extensive reg for BP hydral.labetalol and losartan 6. Volume- will dc IVF"s, looks better hydrated now.  Last wt 49.4 kg. 6.6 kg below EDW. Thought we had d/cd fluids yest- needs volume removal likely now  7. Nutrition- Alb 2.7. Poor PO intake.  Dysphagia 1 diet/thin liquids. Renal vit/supplement. Add prostat.  7. H/O Seizures:on Vimpat now, see #1 above. No seizure activity reported.  8. Dispo- consider a palliative care consult   Rita H. Brown NP-C 12/31/2015, 9:11 AM  [facility] [phone]  Patient seen and examined, agree with above note with above modifications. Still sleepy- now could be due to phenergan given for nausea- plan per primary team.   Plan for next HD tomorrow ut suspect his mental status  issues will continue to wax and wane  [name], MD 12/31/2015

## 2015-12-31 NOTE — Progress Notes (Signed)
[facility]  PROGRESS NOTE  [name] [id number] DOB: 1955-12-14 DOA: 12/23/2015 PCP: No PCP Per Patient    Brief HPI:  60 y.o. male with medical history significant for chronic kidney disease on dialysis, known seizure disorder on Keppra, hypertension with LVH, anemia of chronic kidney disease, dyslipidemia, prolonged QT interval on EKG which is chronic, history of polysubstance abuse with prior admissions for alcohol withdrawal, history of cocaine use, history of hepatitis C anybody positive and prior hepatitis B infection, and acute GI bleeding during previous admission August of this year. 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 He was recently discharged on 8/25. He had been admitted due to altered mentation presumed related to polypharmacy.  patient was sent to the ER from dialysis after they noticed altered mentation. He was attempting to verbalize but responses were unintelligible and very soft. He was unable to follow commands but was spontaneously moving all of his extremities. He last completed dialysis on Saturday. In the ER his initial rectal temperature was 95.5, he was hypertensive, not hypoxic but in setting of altered mentation and known dialysis patient at risk for bacterial infections ER treated him as sepsis protocol. He has remained hypertensive and altered with normal lactic acid, no leukocytosis or elevation in neutrophils and no definitive source although blood cultures collected and pending. His other labs are at baseline for him given need for dialysis which is due today. This felt at this juncture patient did not have sepsis etiology to his altered mentation and other etiologies are now being pursued.    Assessment/Plan:    1. Acute encephalopathy-Slightly improved, waxing and waning mental status, was Improving after stopping Keppra,on vimpat- actually more responsive today,  . EEG showed no epileptiform discharges. MRI could not be performed  earlier, but  MRI done  after giving 2 mg IV Ativan which showed no significant abnormality. Neurontin has been discontinued by nephrology. Swallow evaluation done patient was started on full liquid diet.  Neurology following. Chest x-ray negative for pneumonia 2. Seizure disorder- EEG negative for any epileptiform discharges, patient has been on Keppra, Keppra level 37.9, Keppra has been discontinued and patient started on Vimpat. 3. ? UTI- patient was started on empiric  ceftriaxone, urine culture shows no growth. Discontinued ceftriaxone. 4. ESRD on hemodialysis-patient on Tuesday Thursday Saturday hemodialysis. Nephrology following. Dry weight down 6 kg,  IV fluids., hemodialysis tomorrow 5. Prolonged QT interval- chronic,  checked serum magnesium 2.2. Avoid Haldol Zofran and fluoroquinolones. 6. Vomiting- ? Cause, start Phenergan 12.5 mg IV q 8 hr prn. Will check CBC today, that there was a question of coffee-ground emesis.     DVT prophylaxis: heparin Code Status: Full code Family Communication: No family at bedside Disposition Plan: Pending improvement in mental status, hemodialysis, PT OT evaluation   Consultants:   Nephrology  Procedures:  EEG  Antibiotics:  Ceftriaxone  Subjective   Opens eyes to verbal stimuli, responds slowly to verbal stimuli.   Objective    Objective: Vitals:   12/31/15 0423 12/31/15 0635 12/31/15 0649 12/31/15 0954  BP: (!) 180/57 (!) 190/67 (!) 144/121 (!) 174/73  Pulse: 76 76  79  Resp: 18   17  Temp: 98.3 F (36.8 C)   97.5 F (36.4 C)  TempSrc:    Oral  SpO2: 95%   98%  Weight:      Height:        Intake/Output Summary (Last 24 hours) at 12/31/15 1211 Last data filed at 12/31/15 0930  Gross per 24 hour  Intake              180 ml  Output                0 ml  Net              180 ml   Filed Weights   12/30/15 0825 12/30/15 1204 12/30/15 2214  Weight: 52 kg (114 lb 10.2 oz) 51 kg (112 lb 7 oz) 49.4 kg (109 lb)     Examination:  General exam: Appears calm and comfortable  Respiratory system: Clear to auscultation. Respiratory effort normal. Cardiovascular system: S1 & S2 heard, RRR. No JVD, murmurs, rubs, gallops or clicks. No pedal edema. Gastrointestinal system: Abdomen is nondistended, soft and nontender. No organomegaly or masses felt. Normal bowel sounds heard. Central nervous system: Alert and oriented x 1. No focal neurological deficits. Extremities: Symmetric 5 x 5 power. Skin: No rashes, lesions or ulcers Psychiatry: Judgement and insight appear normal. Mood & affect appropriate.    Data Reviewed: I have personally reviewed following labs and imaging studies Basic Metabolic Panel:  Recent Labs Lab 12/25/15 0913 12/27/15 0811 12/30/15 0833  NA 141 138 134*  K 3.7 4.1 3.7  CL 101 98* 96*  CO2 27 27 27   GLUCOSE 63* 87 106*  BUN 26* 18 19  CREATININE 6.67* 6.05* 5.88*  CALCIUM 10.6* 10.5* 9.4  PHOS 6.6* 6.5* 5.3*   Liver Function Tests:  Recent Labs Lab 12/25/15 0913 12/27/15 0811 12/30/15 0833  ALBUMIN 3.0* 2.9* 2.7*   No results for input(s): LIPASE, AMYLASE in the last 168 hours.  Recent Labs Lab 12/28/15 1215  AMMONIA 45*   CBC:  Recent Labs Lab 12/25/15 0912 12/27/15 0811 12/30/15 1652  WBC 5.8 7.4 3.7*  NEUTROABS  --   --  2.5  HGB 11.6* 12.2* 10.9*  HCT 38.0* 40.1 35.1*  MCV 92.7 92.0 91.2  PLT 158 141* 132*   Cardiac Enzymes: No results for input(s): CKTOTAL, CKMB, CKMBINDEX, TROPONINI in the last 168 hours. BNP (last 3 results)  Recent Labs  08/16/15 2256  BNP 157.1*    ProBNP (last 3 results) No results for input(s): PROBNP in the last 8760 hours.  CBG:  Recent Labs Lab 12/30/15 2020 12/30/15 2355 12/31/15 0401 12/31/15 0727 12/31/15 1159  GLUCAP 82 77 79 92 86    Recent Results (from the past 240 hour(s))  Blood Culture (routine x 2)     Status: None   Collection Time: 12/23/15  8:16 AM  Result Value Ref Range Status    Specimen Description BLOOD RIGHT FOREARM  Final   Special Requests BOTTLES DRAWN AEROBIC AND ANAEROBIC 10CC  Final   Culture NO GROWTH 5 DAYS  Final   Report Status 12/28/2015 FINAL  Final  Blood Culture (routine x 2)     Status: None   Collection Time: 12/23/15  9:43 AM  Result Value Ref Range Status   Specimen Description BLOOD RIGHT HAND  Final   Special Requests BOTTLES DRAWN AEROBIC AND ANAEROBIC 5CC  Final   Culture NO GROWTH 5 DAYS  Final   Report Status 12/28/2015 FINAL  Final  Urine culture     Status: None   Collection Time: 12/23/15 10:57 AM  Result Value Ref Range Status   Specimen Description URINE, CATHETERIZED  Final   Special Requests NONE  Final   Culture NO GROWTH  Final   Report Status 12/24/2015 FINAL  Final  Studies: No results found.  Scheduled Meds: . aspirin  81 mg Oral Daily  . atorvastatin  40 mg Oral q1800  . feeding supplement  1 Container Oral BID BM  . feeding supplement (PRO-STAT SUGAR FREE 64)  30 mL Oral BID BM  . heparin  5,000 Units Subcutaneous Q8H  . hydrALAZINE  100 mg Oral Q8H  . labetalol  200 mg Oral Q8H  . lacosamide  100 mg Oral BID  . losartan  100 mg Oral QHS  . multivitamin  1 tablet Oral QHS  . sevelamer carbonate  2.4 g Oral TID WC  . sodium chloride flush  3 mL Intravenous Q12H   Continuous Infusions:   Time spent: 25 min    [facility] Pager [phone]. If 7PM-7AM, please contact night-coverage at [email or web address], Office  [phone]  password [email or web address] 12/31/2015, 12:11 PM  LOS: 8 days

## 2016-01-01 DIAGNOSIS — R8290 Unspecified abnormal findings in urine: Secondary | ICD-10-CM

## 2016-01-01 DIAGNOSIS — R400 Somnolence: Secondary | ICD-10-CM

## 2016-01-01 LAB — RENAL FUNCTION PANEL
Albumin: 2.7 g/dL — ABNORMAL LOW (ref 3.5–5.0)
Anion gap: 8 (ref 5–15)
BUN: 19 mg/dL (ref 6–20)
CO2: 28 mmol/L (ref 22–32)
Calcium: 9.3 mg/dL (ref 8.9–10.3)
Chloride: 100 mmol/L — ABNORMAL LOW (ref 101–111)
Creatinine, Ser: 4.77 mg/dL — ABNORMAL HIGH (ref 0.61–1.24)
GFR calc Af Amer: 14 mL/min — ABNORMAL LOW (ref >60)
GFR calc non Af Amer: 12 mL/min — ABNORMAL LOW (ref >60)
Glucose, Bld: 107 mg/dL — ABNORMAL HIGH (ref 65–99)
Phosphorus: 4.7 mg/dL — ABNORMAL HIGH (ref 2.5–4.6)
Potassium: 4.2 mmol/L (ref 3.5–5.1)
Sodium: 136 mmol/L (ref 135–145)

## 2016-01-01 LAB — CBC
HEMATOCRIT: 32.4 % — AB (ref 39.0–52.0)
HEMOGLOBIN: 9.8 g/dL — AB (ref 13.0–17.0)
MCH: 27.7 pg (ref 26.0–34.0)
MCHC: 30.2 g/dL (ref 30.0–36.0)
MCV: 91.5 fL (ref 78.0–100.0)
Platelets: 151 10*3/uL (ref 150–400)
RBC: 3.54 MIL/uL — ABNORMAL LOW (ref 4.22–5.81)
RDW: 15.4 % (ref 11.5–15.5)
WBC: 4.8 10*3/uL (ref 4.0–10.5)

## 2016-01-01 LAB — GLUCOSE, CAPILLARY
GLUCOSE-CAPILLARY: 72 mg/dL (ref 65–99)
GLUCOSE-CAPILLARY: 84 mg/dL (ref 65–99)
Glucose-Capillary: 79 mg/dL (ref 65–99)
Glucose-Capillary: 92 mg/dL (ref 65–99)

## 2016-01-01 LAB — AMMONIA: Ammonia: 22 umol/L (ref 9–35)

## 2016-01-01 MED ORDER — SEVELAMER CARBONATE 2.4 G PO PACK: 2.4000 g | PACK | Freq: Three times a day (TID) | ORAL | 1 refills | Status: AC

## 2016-01-01 MED ORDER — ALTEPLASE 2 MG IJ SOLR: 2.0000 mg | Freq: Once | INTRAMUSCULAR | Status: DC | PRN

## 2016-01-01 MED ORDER — LACOSAMIDE 100 MG PO TABS: 100.0000 mg | ORAL_TABLET | Freq: Two times a day (BID) | ORAL | 2 refills | Status: AC

## 2016-01-01 MED ORDER — LIDOCAINE-PRILOCAINE 2.5-2.5 % EX CREA: 1.0000 "application " | TOPICAL_CREAM | CUTANEOUS | Status: DC | PRN

## 2016-01-01 MED ORDER — PENTAFLUOROPROP-TETRAFLUOROETH EX AERO: 1.0000 "application " | INHALATION_SPRAY | CUTANEOUS | Status: DC | PRN

## 2016-01-01 MED ORDER — PANTOPRAZOLE SODIUM 40 MG PO TBEC: 40.0000 mg | DELAYED_RELEASE_TABLET | Freq: Every day | ORAL | 1 refills | Status: AC

## 2016-01-01 MED ORDER — SODIUM CHLORIDE 0.9 % IV SOLN: 100.0000 mL | INTRAVENOUS | Status: DC | PRN

## 2016-01-01 MED ORDER — HYDRALAZINE HCL 100 MG PO TABS: 100.0000 mg | ORAL_TABLET | Freq: Four times a day (QID) | ORAL | 0 refills | Status: AC

## 2016-01-01 MED ORDER — PANTOPRAZOLE SODIUM 40 MG PO TBEC
40.0000 mg | DELAYED_RELEASE_TABLET | Freq: Every day | ORAL | Status: DC
  Administered 2016-01-01: 40 mg via ORAL
  Filled 2016-01-01: qty 1

## 2016-01-01 MED ORDER — LIDOCAINE HCL (PF) 1 % IJ SOLN: 5.0000 mL | INTRAMUSCULAR | Status: DC | PRN

## 2016-01-01 MED ORDER — HEPARIN SODIUM (PORCINE) 1000 UNIT/ML DIALYSIS: 1000.0000 [IU] | INTRAMUSCULAR | Status: DC | PRN

## 2016-01-01 NOTE — Progress Notes (Signed)
SLP Cancellation Note  Patient Details Name: Alain Micallef MRN: [id number] DOB: 02-13-1956   Cancelled treatment:       Reason Eval/Treat Not Completed: Patient at procedure or test/unavailable, in HD   [facility], Katherene Ponto 01/01/2016, 9:01 AM

## 2016-01-01 NOTE — NC FL2 (Signed)
[place] MEDICAID FL2 LEVEL OF CARE SCREENING TOOL     IDENTIFICATION  Patient Name: [name] Birthdate: October 28, 1955 Sex: male Admission Date (Current Location): 12/23/2015  [facility] and [place] Number:  Herbalist and Address:  The . [facility], [place] [address] [place], [place] [address]      Provider Number: [id number]  Attending Physician Name and Address:  Reyne Dumas, MD  Relative Name and Phone Number:       Current Level of Care: Hospital Recommended Level of Care: [place] Prior Approval Number:    Date Approved/Denied:   PASRR Number: [id number]  Discharge Plan: SNF    Current Diagnoses: Patient Active Problem List   Diagnosis Date Noted  . Abnormal urinalysis 12/23/2015  . Mental status change   . Polysubstance abuse 09/17/2015  . HLD (hyperlipidemia) 09/17/2015  . Physical deconditioning   . Alcoholism /alcohol abuse ([place])   . NSTEMI (non-ST elevated myocardial infarction) ([place]) 08/16/2015  . Hepatitis C antibody test positive 06/23/2015  . History of hepatitis B virus infection 06/20/2015  . Acute encephalopathy 06/20/2015  . Alcohol withdrawal ([place]) 06/19/2015  . Acute GI bleeding 04/12/2015  . LVH (left ventricular hypertrophy) due to hypertensive disease 04/12/2015  . Prolonged Q-T interval on ECG 04/12/2015  . Anemia of chronic kidney failure 04/26/2014  . Hyperparathyroidism, secondary renal ([place]) 04/26/2014  . Protein-calorie malnutrition, severe ([place]) 01/25/2014  . Optic neuritis, left 01/19/2014  . Cocaine abuse 03/02/2012  . Uncontrolled hypertension   . ESRD on hemodialysis ([place])   . Seizure disorder ([place])     Orientation RESPIRATION BLADDER Height & Weight     Self, Place  Normal Continent Weight: 102 lb 8.2 oz (46.5 kg) Height:  5\' 10"  (177.8 cm)  BEHAVIORAL SYMPTOMS/MOOD NEUROLOGICAL BOWEL NUTRITION STATUS      Continent Diet (Heart Healthy)  AMBULATORY STATUS COMMUNICATION OF  NEEDS Skin   Extensive Assist Verbally Normal                       Personal Care Assistance Level of Assistance  Bathing, Feeding, Dressing Bathing Assistance: Limited assistance Feeding assistance: Independent Dressing Assistance: Limited assistance     Functional Limitations Info  Sight, Hearing, Speech Sight Info: Adequate Hearing Info: Adequate Speech Info: Adequate    SPECIAL CARE FACTORS FREQUENCY  PT (By licensed PT), OT (By licensed OT)     PT Frequency: 5 OT Frequency: 5            Contractures Contractures Info: Not present    Additional Factors Info  Code Status, Allergies Code Status Info: Full Code Allergies Info: Reglan Metoclopramide           Current Medications (01/01/2016):  This is the current hospital active medication list Current Facility-Administered Medications  Medication Dose Route Frequency Provider Last Rate Last Dose  . aspirin chewable tablet 81 mg  81 mg Oral Daily [name], MD   81 mg at 01/01/16 1229  . atorvastatin (LIPITOR) tablet 40 mg  40 mg Oral q1800 [name], MD   40 mg at 12/31/15 1737  . feeding supplement (BOOST / RESOURCE BREEZE) liquid 1 Container  1 Container Oral BID BM Oswald Hillock, MD   1 Container at 01/01/16 1229  . feeding supplement (PRO-STAT SUGAR FREE 64) liquid 30 mL  30 mL Oral BID BM Oswald Hillock, MD   30 mL at 01/01/16 1400  . heparin injection 5,000 Units  5,000 Units Subcutaneous Q8H Samella Parr, NP   5,000 Units at 01/01/16 1235  . hydrALAZINE (APRESOLINE) injection 10 mg  10 mg Intravenous Q6H PRN Samella Parr, NP   10 mg at 12/31/15 1026  . hydrALAZINE (APRESOLINE) tablet 100 mg  100 mg Oral Q8H Valentina Gu, NP   100 mg at 01/01/16 1235  . labetalol (NORMODYNE) tablet 200 mg  200 mg Oral Q8H Valentina Gu, NP   200 mg at 01/01/16 1233  . lacosamide (VIMPAT) tablet 100 mg  100 mg Oral BID Greta Doom, MD   100 mg at 01/01/16 1229  . losartan (COZAAR) tablet  100 mg  100 mg Oral QHS Valentina Gu, NP   100 mg at 12/31/15 2223  . multivitamin (RENA-VIT) tablet 1 tablet  1 tablet Oral QHS [name], MD   1 tablet at 12/31/15 2223  . naloxone [facility]) injection 0.4 mg  0.4 mg Intravenous PRN [name], MD   0.4 mg at 12/23/15 1552  . pantoprazole (PROTONIX) EC tablet 40 mg  40 mg Oral Daily Reyne Dumas, MD   40 mg at 01/01/16 1235  . polyvinyl alcohol (LIQUIFILM TEARS) 1.4 % ophthalmic solution 1 drop  1 drop Both Eyes PRN [name], MD   1 drop at 12/31/15 2231  . promethazine (PHENERGAN) injection 12.5 mg  12.5 mg Intravenous Q8H PRN Oswald Hillock, MD   12.5 mg at 12/31/15 0421  . sevelamer carbonate (RENVELA) powder PACK 2.4 g  2.4 g Oral TID WC Valentina Gu, NP   2.4 g at 01/01/16 1229  . sodium chloride flush (NS) 0.9 % injection 3 mL  3 mL Intravenous Q12H Samella Parr, NP   3 mL at 12/31/15 2224     Discharge Medications: Please see discharge summary for a list of discharge medications.  Relevant Imaging Results:  Relevant Lab Results:   Additional Information SSN:  [id number]   HD:  TTS  Darden Dates, LCSW

## 2016-01-01 NOTE — Evaluation (Signed)
Physical Therapy Evaluation Patient Details Name: [name] MRN: [id number] DOB: [birth year] Today's Date: 01/01/2016   History of Present Illness  Patient is a 60 yo male admitted 12/23/15 with acute encephalopathy.   PMH:  chronic kidney disease on dialysis, known seizure disorder, hypertension, anemia of chronic kidney disease, dyslipidemia, polysubstance abuse, hepatitis C, and acute GI bleedng. 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   Clinical Impression  Patient presents with problems listed below.  Will benefit from acute PT to maximize functional mobility prior to discharge.  Recommend patient return to SNF for continued therapy at d/c.    Follow Up Recommendations SNF;Supervision/Assistance - 24 hour    Equipment Recommendations  None recommended by PT    Recommendations for Other Services       Precautions / Restrictions Precautions Precautions: Fall Restrictions Weight Bearing Restrictions: No      Mobility  Bed Mobility Overal bed mobility: Needs Assistance Bed Mobility: Supine to Sit;Sit to Supine     Supine to sit: Min assist;HOB elevated Sit to supine: Min assist;HOB elevated   General bed mobility comments: Min assist to raise trunk to upright sitting position, and to scoot to EOB in sitting.  Patient able to sit with min guard assist.  Reports feeling lightheaded.  Assist to reposition in bed once returned to supine.  Transfers                 General transfer comment: NT  Ambulation/Gait                Stairs            Wheelchair Mobility    Modified Rankin (Stroke Patients Only)       Balance   Sitting-balance support: Single extremity supported Sitting balance-Leahy Scale: Fair                                       Pertinent Vitals/Pain Pain Assessment: 0-10 Pain Score: 8  Pain Location: RLE Pain Descriptors / Indicators: Sore;Aching Pain Intervention(s): Monitored during  session;Repositioned    Home Living Family/patient expects to be discharged to:: [place]: [place]ford Rile - 2 wheels;Cane - single point;Shower seat      Prior Function Level of Independence: Needs assistance   Gait / Transfers Assistance Needed: Assist with transfers to w/c  ADL's / Homemaking Assistance Needed: assist for ADL's in SNF        Hand Dominance   Dominant Hand: Right    Extremity/Trunk Assessment   Upper Extremity Assessment: Generalized weakness           Lower Extremity Assessment: Generalized weakness;RLE deficits/detail RLE Deficits / Details: Rt BKA       Communication   Communication: No difficulties  Cognition Arousal/Alertness: Awake/alert Behavior During Therapy: Flat affect Overall Cognitive Status: No family/caregiver present to determine baseline cognitive functioning                      General Comments      Exercises        Assessment/Plan    PT Assessment Patient needs continued PT services  PT Diagnosis Generalized weakness;Acute pain;Altered mental status   PT Problem List Decreased strength;Decreased activity tolerance;Decreased balance;Decreased mobility;Decreased cognition;Decreased knowledge of use of DME;Pain  PT Treatment Interventions DME  instruction;Gait training;Functional mobility training;Therapeutic activities;Therapeutic exercise;Balance training;Cognitive remediation;Patient/family education   PT Goals (Current goals can be found in the Care Plan section) Acute Rehab PT Goals Patient Stated Goal: To go back for rehab PT Goal Formulation: With patient Time For Goal Achievement: 01/08/16 Potential to Achieve Goals: Fair    Frequency Min 2X/week   Barriers to discharge        Co-evaluation               End of Session   Activity Tolerance: Patient limited by fatigue;Patient limited by pain Patient left: in bed;with call bell/phone within  reach;with bed alarm set Nurse Communication: Mobility status         Time: [id number] PT Time Calculation (min) (ACUTE ONLY): 10 min   Charges:   PT Evaluation $PT Eval Low Complexity: 1 Procedure     PT G CodesDespina Pole Jan 11, 2016, 1:38 PM Carita Pian. Sanjuana Kava, [place] Pager [phone]

## 2016-01-01 NOTE — Clinical Social Work Note (Signed)
Clinical Social Work Assessment  Patient Details  Name: [name] MRN: [id number] Date of Birth: 1956-01-06  Date of referral:  01/01/16               Reason for consult:  Facility Placement                Permission sought to share information with:  Family Supports Permission granted to share information::  Yes, Verbal Permission Granted  Name::     Belinda Block  Relationship::  friend  Contact Information:  [phone]  Housing/Transportation Living arrangements for the past 2 months:  [place] of Information:  Patient Patient Interpreter Needed:  None Criminal Activity/Legal Involvement Pertinent to Current Situation/Hospitalization:  No - Comment as needed Significant Relationships:  Friend Lives with:  Facility Resident Do you feel safe going back to the place where you live?  Yes Need for family participation in patient care:  No (Coment)  Care giving concerns:  No care giving concerns identified.  Social Worker assessment / plan:  CSW met with pt to address consult for New SNF. CSW introduced herself and explained the role of social work. Pt was admitted from [facility], and is ready to return to facility. Pt is agreeable to returning. Facility is able to accept pt as they have received discharge paperwork. CSW left a message for pt's friend, Hilda Blades, requesting a return phone call. Pt does not have a Legal Guardian. RN will call report. PTAR will provide transportation. CSW is signing off as no further needs identified.   Employment status:  Disabled (Comment on whether or not currently receiving Disability) Insurance information:  Medicare, Medicaid [place] PT Recommendations:  [place] / Referral to community resources:   Engineer, water)  Patient/Family's Response to care:  Pt was appreciative of CSW support.   Patient/Family's Understanding of and Emotional Response to Diagnosis, Current Treatment, and  Prognosis:  Pt understands that he is returning to facility.  Emotional Assessment Appearance:  Appears stated age Attitude/Demeanor/Rapport:   (Appropriate) Affect (typically observed):  Accepting, Adaptable, Pleasant Orientation:  Oriented to Self, Oriented to Place, Oriented to  Time, Oriented to Situation Alcohol / Substance use:  Never Used Psych involvement (Current and /or in the community):  No (Comment)  Discharge Needs  Concerns to be addressed:  No discharge needs identified Readmission within the last 30 days:  Yes Current discharge risk:  Chronically ill Barriers to Discharge:  No Barriers Identified   Darden Dates, LCSW 01/01/2016, 3:47 PM

## 2016-01-01 NOTE — Discharge Summary (Addendum)
Physician Discharge Summary  [name] MRN: [id number] DOB/AGE: November 07, 1955 60 y.o.  PCP: No PCP Per Patient   Admit date: 12/23/2015 Discharge date: 01/01/2016  Discharge Diagnoses:    Principal Problem:   Acute encephalopathy Active Problems:   Uncontrolled hypertension   ESRD on hemodialysis (HCC)   Seizure disorder (HCC)   Protein-calorie malnutrition, severe (HCC)   Anemia of chronic kidney failure   Prolonged Q-T interval on ECG   HLD (hyperlipidemia)   Abnormal urinalysis    Follow-up recommendations Follow-up with PCP in 3-5 days , including all  additional recommended appointments as below Follow-up CBC, CMP in 3-5 days Amantadine dc as it had no clear indication to be continued keppra and gabapentin discontinued due to AMS  Recommend palliative care to follow patient at SNF    Current Discharge Medication List    START taking these medications   Details  lacosamide 100 MG TABS Take 1 tablet (100 mg total) by mouth 2 (two) times daily. Qty: 60 tablet, Refills: 2    pantoprazole (PROTONIX) 40 MG tablet Take 1 tablet (40 mg total) by mouth daily. Qty: 30 tablet, Refills: 1    sevelamer carbonate (RENVELA) 2.4 g PACK Take 2.4 g by mouth 3 (three) times daily with meals. Qty: 90 each, Refills: 1      CONTINUE these medications which have CHANGED   Details  hydrALAZINE (APRESOLINE) 100 MG tablet Take 1 tablet (100 mg total) by mouth every 6 (six) hours. Qty: 90 tablet, Refills: 0      CONTINUE these medications which have NOT CHANGED   Details  acetaminophen (TYLENOL) 325 MG tablet Take 650 mg by mouth every 4 (four) hours as needed.    aspirin EC 81 MG EC tablet Take 1 tablet (81 mg total) by mouth daily. Qty: 30 tablet, Refills: 3    atorvastatin (LIPITOR) 40 MG tablet Take 1 tablet (40 mg total) by mouth at bedtime. Qty: 30 tablet, Refills: 3    B Complex-C-Folic Acid (RENA-VITE PO) Take 1 tablet by mouth daily.    calcium acetate (PHOSLO) 667  MG capsule Take 667 mg by mouth 3 (three) times daily with meals.     docusate sodium (COLACE) 100 MG capsule Take 100 mg by mouth 2 (two) times daily.    glucagon (GLUCAGON EMERGENCY) 1 MG injection Inject 1 mg into the vein once as needed.    labetalol (NORMODYNE) 100 MG tablet Take 1 tablet (100 mg total) by mouth 3 (three) times daily. Qty: 90 tablet, Refills: 0    losartan (COZAAR) 100 MG tablet Take 100 mg by mouth at bedtime.     mirtazapine (REMERON) 7.5 MG tablet Take 7.5 mg by mouth at bedtime.    ondansetron (ZOFRAN) 4 MG tablet Take 4 mg by mouth every 8 (eight) hours as needed for nausea or vomiting.    polyethylene glycol (MIRALAX / GLYCOLAX) packet Take 17 g by mouth daily. Qty: 14 each, Refills: 0    saccharomyces boulardii (FLORASTOR) 250 MG capsule Take 250 mg by mouth daily.    senna-docusate (SENOKOT-S) 8.6-50 MG tablet Take 1 tablet by mouth 2 (two) times daily. For constipation Qty: 60 tablet, Refills: 3    verapamil (CALAN) 120 MG tablet Take 1 tablet (120 mg total) by mouth every 8 (eight) hours. Qty: 60 tablet, Refills: 3      STOP taking these medications     amantadine (SYMMETREL) 100 MG capsule      gabapentin (NEURONTIN) 100 MG capsule  HYDROcodone-acetaminophen (NORCO/VICODIN) 5-325 MG tablet      levETIRAcetam (KEPPRA) 500 MG tablet           Discharge Condition: stable   Discharge Instructions Get Medicines reviewed and adjusted: Please take all your medications with you for your next visit with your Primary MD  Please request your Primary MD to go over all hospital tests and procedure/radiological results at the follow up, please ask your Primary MD to get all Hospital records sent to his/her office.  If you experience worsening of your admission symptoms, develop shortness of breath, life threatening emergency, suicidal or homicidal thoughts you must seek medical attention immediately by calling 911 or calling your MD  immediately if symptoms less severe.  You must read complete instructions/literature along with all the possible adverse reactions/side effects for all the Medicines you take and that have been prescribed to you. Take any new Medicines after you have completely understood and accpet all the possible adverse reactions/side effects.   Do not drive when taking Pain medications.   Do not take more than prescribed Pain, Sleep and Anxiety Medications  Special Instructions: If you have smoked or chewed Tobacco in the last 2 yrs please stop smoking, stop any regular Alcohol and or any Recreational drug use.  Wear Seat belts while driving.  Please note  You were cared for by a hospitalist during your hospital stay. Once you are discharged, your primary care physician will handle any further medical issues. Please note that NO REFILLS for any discharge medications will be authorized once you are discharged, as it is imperative that you return to your primary care physician (or establish a relationship with a primary care physician if you do not have one) for your aftercare needs so that they can reassess your need for medications and monitor your lab values.  Discharge Instructions    Diet - low sodium heart healthy    Complete by:  As directed    Increase activity slowly    Complete by:  As directed        Allergies  Allergen Reactions  . Reglan [Metoclopramide] Other (See Comments)    Tardive dyskinesia in 11/2011 in [place]      Disposition: 03-Skilled Nursing Facility   Consults:   nephrology    Significant Diagnostic Studies:  X-ray Chest Pa And Lateral  Result Date: 12/11/2015 CLINICAL DATA:  Interstitial edema EXAM: CHEST  2 VIEW COMPARISON:  Portable chest x-ray of December 10, 2015 FINDINGS: The lungs are adequately inflated. There are coarse lung markings at both bases. These are more conspicuous on the right but are stable on the left. The left hemidiaphragm is slightly  better demonstrated today. There are pleural effusions layering posteriorly, bilaterally. The cardiac silhouette remains enlarged. The central pulmonary vascularity is prominent. There is calcification in the wall of the aortic arch. IMPRESSION: CHF with mild interstitial edema and bilateral pleural effusions. Bibasilar atelectasis greatest on the left. There has not been significant interval change since yesterday's study. Electronically Signed   By: David  Martinique M.D.   On: 12/11/2015 07:18   Ct Head Wo Contrast  Result Date: 12/10/2015 CLINICAL DATA:  Altered mental status.  Chronic renal failure EXAM: CT HEAD WITHOUT CONTRAST TECHNIQUE: Contiguous axial images were obtained from the base of the skull through the vertex without intravenous contrast. COMPARISON:  December 05, 2015 FINDINGS: Brain: The ventricles and sulci remain within normal limits for age. There is no intracranial mass, hemorrhage, extra-axial fluid collection, or midline  shift. There is stable small vessel disease in the centra semiovale bilaterally, most notably anteriorly. Small vessel disease in the pons is also present and unchanged. There is no new gray-white compartment lesion evident. No acute infarct evident. Vascular: No hyperdense vessels are evident. Calcification in each carotid siphon and distal vertebral artery regions remains. Skull: Bony calvarium appears intact. Sinuses/Orbits: Visualized paranasal sinuses are clear. Orbits appear symmetric bilaterally. Orbits Other: Mastoid air cells are clear. IMPRESSION: Stable small vessel disease in supratentorial and infratentorial regions. No acute infarct evident. No mass, hemorrhage, or extra-axial fluid collection. There are multiple foci of arterial vascular calcification. Electronically Signed   By: Lowella Grip III M.D.   On: 12/10/2015 14:03   Ct Head Wo Contrast  Result Date: 12/05/2015 CLINICAL DATA:  Altered mental status.  Chronic renal failure EXAM: CT HEAD WITHOUT  CONTRAST TECHNIQUE: Contiguous axial images were obtained from the base of the skull through the vertex without intravenous contrast. COMPARISON:  February 01, 2014 FINDINGS: Brain: Ventricles and sulci are within normal limits for age. There is no intracranial mass, hemorrhage, extra-axial fluid collection, or midline shift. There is patchy small vessel disease in the centra semiovale bilaterally, most notably in the anterior centra semiovale bilaterally, stable. Small vessel disease is also noted in the mid pons in the basilar perforator distribution. Elsewhere gray-white compartments appear normal. No acute infarct evident. Vascular: There is no hyperdense vessel. There are foci of calcification in each carotid siphon and distal vertebral artery region. Skull: The bony calvarium appears intact. Sinuses/Orbits: Orbits appear symmetric bilaterally. Visualized paranasal sinuses are clear. Other: Mastoid air cells are clear. IMPRESSION: Stable supratentorial infratentorial small vessel disease. No acute infarct evident. No hemorrhage or mass effect. Foci of arterial vascular calcification noted. Electronically Signed   By: Lowella Grip III M.D.   On: 12/05/2015 09:06   Mr Brain Wo Contrast  Result Date: 12/24/2015 CLINICAL DATA:  Seizure disorder.  Altered mental status. EXAM: MRI HEAD WITHOUT CONTRAST TECHNIQUE: Multiplanar, multiecho pulse sequences of the brain and surrounding structures were obtained without intravenous contrast. COMPARISON:  Head CT 12/10/2015, brain MRI 01/21/2014 FINDINGS: Brain: No acute infarct or intraparenchymal hemorrhage. The midline structures are normal. There is confluent hyperintense T2 weighted signal within the periventricular white matter and brainstem. No mass lesion or midline shift. No hydrocephalus or extra-axial fluid collection. There is advanced atrophy for age. Vascular: Major intracranial flow voids are preserved. No evidence of chronic microhemorrhage or amyloid  angiopathy. Skull and upper cervical spine: The visualized skull base, calvarium, upper cervical spine and extracranial soft tissues are normal. Sinuses/Orbits: No fluid levels or advanced mucosal thickening. No mastoid effusion. Normal orbits. IMPRESSION: 1. No acute intracranial abnormality. 2. Findings of advanced chronic microvascular ischemia involving the periventricular white matter and brainstem. This appears unchanged compared to 01/21/2014. 3. Advanced atrophy for age. Electronically Signed   By: Ulyses Jarred M.D.   On: 12/24/2015 23:21   Dg Chest Port 1 View  Result Date: 12/23/2015 CLINICAL DATA:  Sepsis, unable to communicate, history of chronic CHF, hepatitis-B and Celsius, positive PPD, dialysis dependent renal failure. EXAM: PORTABLE CHEST 1 VIEW COMPARISON:  Portable chest x-ray of December 12, 2015 FINDINGS: The lungs are slightly less well inflated today. The interstitial markings are coarse in the perihilar regions and left mid lung. The cardiac silhouette is top-normal in size. The pulmonary vascularity is normal. There is calcification in the wall of the aortic arch. The trachea is midline. The bony thorax exhibits no  acute abnormality. There degenerative changes of both shoulders. IMPRESSION: Mild perihilar interstitial prominence today may reflect low-grade interstitial edema or early interstitial pneumonia. Aortic atherosclerosis. Electronically Signed   By: David  Martinique M.D.   On: 12/23/2015 08:10   Dg Chest Port 1 View  Result Date: 12/12/2015 CLINICAL DATA:  Pulmonary edema, end-stage renal disease EXAM: PORTABLE CHEST 1 VIEW COMPARISON:  12/11/2015 FINDINGS: Stable cardiomegaly. Improvement in the pulmonary edema pattern with residual left basilar atelectasis. No significant effusion or pneumothorax. Left upper extremity vascular stents noted. Remote postop changes of the right shoulder. Degenerative changes of the spine and both shoulders. Atherosclerosis of the aorta. Trachea is  midline. IMPRESSION: Improving CHF.  Residual left basilar atelectasis. Electronically Signed   By: Jerilynn Mages.  Shick M.D.   On: 12/12/2015 14:10   Dg Chest Port 1 View  Result Date: 12/10/2015 CLINICAL DATA:  Weakness and lethargy.  Mostly unresponsive. EXAM: PORTABLE CHEST 1 VIEW COMPARISON:  12/03/2015 FINDINGS: The cardio pericardial silhouette is enlarged. There is pulmonary vascular congestion without overt pulmonary edema. Superimposed interstitial pulmonary edema likely. The visualized bony structures of the thorax are intact. Telemetry leads overlie the chest. IMPRESSION: Cardiomegaly with interstitial pulmonary edema. Electronically Signed   By: Misty Stanley M.D.   On: 12/10/2015 12:48   Dg Chest Port 1 View  Result Date: 12/03/2015 CLINICAL DATA:  Fever and fatigue. EXAM: PORTABLE CHEST 1 VIEW COMPARISON:  08/16/2015 FINDINGS: 1514 hours. Lungs are hyperexpanded. The lungs are clear wiithout focal pneumonia, edema, pneumothorax or pleural effusion. The cardio pericardial silhouette is enlarged. The visualized bony structures of the thorax are intact. Degenerative changes are seen in the glenohumeral joints bilaterally. IMPRESSION: No active disease. Electronically Signed   By: Misty Stanley M.D.   On: 12/03/2015 15:28   Dg Chest Port 1v Same Day  Result Date: 12/28/2015 CLINICAL DATA:  Altered mental status, history of hypertension head the enhanced see. EXAM: PORTABLE CHEST 1 VIEW COMPARISON:  12/23/2015 FINDINGS: There is no acute pulmonary infiltrate, consolidation, or pleural effusion. Cardiomediastinal silhouette is stable. No pneumothorax. Faint ovoid opacity in the left mid lung zone may reflect nipple shadow. There are vascular calcifications over the right neck. Marked degenerative changes of the right shoulder. IMPRESSION: 1. No acute infiltrate or overt pulmonary edema 2. Presumed vascular calcifications in the region of right carotid artery. Electronically Signed   By: Donavan Foil M.D.    On: 12/28/2015 10:40   Dg Knee Complete 4 Views Right  Result Date: 12/10/2015 CLINICAL DATA:  History of right below-the-knee amputation. Recent fevers. Question osteomyelitis. EXAM: RIGHT KNEE - COMPLETE 4+ VIEW COMPARISON:  None. FINDINGS: Surgical staples are in place at the patient's below-the-knee amputation. No soft tissue gas collection is identified. No bony destructive change is seen. There is no fracture. Atherosclerosis is noted. IMPRESSION: Status post right BKA.  No acute abnormality. Electronically Signed   By: Inge Rise M.D.   On: 12/10/2015 16:44        Filed Weights   12/30/15 2214 12/31/15 2022 01/01/16 0724  Weight: 49.4 kg (109 lb) 52.8 kg (116 lb 6.5 oz) 48.5 kg (106 lb 14.8 oz)     Microbiology: Recent Results (from the past 240 hour(s))  Blood Culture (routine x 2)     Status: None   Collection Time: 12/23/15  8:16 AM  Result Value Ref Range Status   Specimen Description BLOOD RIGHT FOREARM  Final   Special Requests BOTTLES DRAWN AEROBIC AND ANAEROBIC 10CC  Final  Culture NO GROWTH 5 DAYS  Final   Report Status 12/28/2015 FINAL  Final  Blood Culture (routine x 2)     Status: None   Collection Time: 12/23/15  9:43 AM  Result Value Ref Range Status   Specimen Description BLOOD RIGHT HAND  Final   Special Requests BOTTLES DRAWN AEROBIC AND ANAEROBIC 5CC  Final   Culture NO GROWTH 5 DAYS  Final   Report Status 12/28/2015 FINAL  Final  Urine culture     Status: None   Collection Time: 12/23/15 10:57 AM  Result Value Ref Range Status   Specimen Description URINE, CATHETERIZED  Final   Special Requests NONE  Final   Culture NO GROWTH  Final   Report Status 12/24/2015 FINAL  Final       Blood Culture    Component Value Date/Time   SDES URINE, CATHETERIZED 12/23/2015 1057   SPECREQUEST NONE 12/23/2015 1057   CULT NO GROWTH 12/23/2015 1057   REPTSTATUS 12/24/2015 FINAL 12/23/2015 1057      Labs: Results for orders placed or performed  during the hospital encounter of 12/23/15 (from the past 48 hour(s))  Glucose, capillary     Status: None   Collection Time: 12/30/15 11:46 AM  Result Value Ref Range   Glucose-Capillary 82 65 - 99 mg/dL  Glucose, capillary     Status: None   Collection Time: 12/30/15  4:39 PM  Result Value Ref Range   Glucose-Capillary 75 65 - 99 mg/dL  CBC with Differential/Platelet     Status: Abnormal   Collection Time: 12/30/15  4:52 PM  Result Value Ref Range   WBC 3.7 (L) 4.0 - 10.5 K/uL   RBC 3.85 (L) 4.22 - 5.81 MIL/uL   Hemoglobin 10.9 (L) 13.0 - 17.0 g/dL   HCT 35.1 (L) 39.0 - 52.0 %   MCV 91.2 78.0 - 100.0 fL   MCH 28.3 26.0 - 34.0 pg   MCHC 31.1 30.0 - 36.0 g/dL   RDW 15.5 11.5 - 15.5 %   Platelets 132 (L) 150 - 400 K/uL   Neutrophils Relative % 68 %   Neutro Abs 2.5 1.7 - 7.7 K/uL   Lymphocytes Relative 14 %   Lymphs Abs 0.5 (L) 0.7 - 4.0 K/uL   Monocytes Relative 13 %   Monocytes Absolute 0.5 0.1 - 1.0 K/uL   Eosinophils Relative 5 %   Eosinophils Absolute 0.2 0.0 - 0.7 K/uL   Basophils Relative 0 %   Basophils Absolute 0.0 0.0 - 0.1 K/uL  Glucose, capillary     Status: None   Collection Time: 12/30/15  8:20 PM  Result Value Ref Range   Glucose-Capillary 82 65 - 99 mg/dL  Glucose, capillary     Status: None   Collection Time: 12/30/15 11:55 PM  Result Value Ref Range   Glucose-Capillary 77 65 - 99 mg/dL  Glucose, capillary     Status: None   Collection Time: 12/31/15  4:01 AM  Result Value Ref Range   Glucose-Capillary 79 65 - 99 mg/dL  Glucose, capillary     Status: None   Collection Time: 12/31/15  7:27 AM  Result Value Ref Range   Glucose-Capillary 92 65 - 99 mg/dL  Glucose, capillary     Status: None   Collection Time: 12/31/15 11:59 AM  Result Value Ref Range   Glucose-Capillary 86 65 - 99 mg/dL  Glucose, capillary     Status: None   Collection Time: 12/31/15  4:25 PM  Result Value  Ref Range   Glucose-Capillary 79 65 - 99 mg/dL  Glucose, capillary     Status:  None   Collection Time: 12/31/15  8:19 PM  Result Value Ref Range   Glucose-Capillary 81 65 - 99 mg/dL  Glucose, capillary     Status: None   Collection Time: 01/01/16 12:12 AM  Result Value Ref Range   Glucose-Capillary 92 65 - 99 mg/dL  Glucose, capillary     Status: None   Collection Time: 01/01/16  4:21 AM  Result Value Ref Range   Glucose-Capillary 79 65 - 99 mg/dL  CBC     Status: Abnormal   Collection Time: 01/01/16  5:00 AM  Result Value Ref Range   WBC 4.8 4.0 - 10.5 K/uL   RBC 3.54 (L) 4.22 - 5.81 MIL/uL   Hemoglobin 9.8 (L) 13.0 - 17.0 g/dL   HCT 32.4 (L) 39.0 - 52.0 %   MCV 91.5 78.0 - 100.0 fL   MCH 27.7 26.0 - 34.0 pg   MCHC 30.2 30.0 - 36.0 g/dL   RDW 15.4 11.5 - 15.5 %   Platelets 151 150 - 400 K/uL  Renal function panel     Status: Abnormal   Collection Time: 01/01/16  7:24 AM  Result Value Ref Range   Sodium 136 135 - 145 mmol/L   Potassium 4.2 3.5 - 5.1 mmol/L   Chloride 100 (L) 101 - 111 mmol/L   CO2 28 22 - 32 mmol/L   Glucose, Bld 107 (H) 65 - 99 mg/dL   BUN 19 6 - 20 mg/dL   Creatinine, Ser 4.77 (H) 0.61 - 1.24 mg/dL   Calcium 9.3 8.9 - 10.3 mg/dL   Phosphorus 4.7 (H) 2.5 - 4.6 mg/dL   Albumin 2.7 (L) 3.5 - 5.0 g/dL   GFR calc non Af Amer 12 (L) >60 mL/min   GFR calc Af Amer 14 (L) >60 mL/min    Comment: (NOTE) The eGFR has been calculated using the CKD EPI equation. This calculation has not been validated in all clinical situations. eGFR's persistently <60 mL/min signify possible Chronic Kidney Disease.    Anion gap 8 5 - 15     Lipid Panel     Component Value Date/Time   CHOL 165 08/16/2015 1835   TRIG 90 08/16/2015 1835   HDL 49 08/16/2015 1835   CHOLHDL 3.4 08/16/2015 1835   VLDL 18 08/16/2015 1835   LDLCALC 98 08/16/2015 1835     Lab Results  Component Value Date   HGBA1C 5.0 11/07/2012   HGBA1C 5.2 06/01/2012        HPI :  60 y.o.malewith medical history significant for chronic kidney disease on dialysis, known  seizure disorder on Keppra, hypertension with LVH, anemia of chronic kidney disease, dyslipidemia, prolonged QT interval on EKG which is chronic, history of polysubstance abuse with prior admissions for alcohol withdrawal, history of cocaine use, 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 He was recently discharged on 8/25.He had been admitted due to altered mentation presumed related to polypharmacy.  patient was sent to the ER from dialysis after they noticed altered mentation. He was attempting to verbalize but responses were unintelligible and very soft. He was unable to follow commands but was spontaneously moving all of his extremities. He last completed dialysis on Saturday. In the ER his initial rectal temperature was 95.5, he  was hypertensive, not hypoxic but in setting of altered mentation and known dialysis patient at risk for bacterial infections ER treated him as sepsis protocol. He has remained hypertensive and altered with normal lactic acid, no leukocytosis or elevation in neutrophils and no definitive source although blood cultures collected and pending. His other labsare at baseline for him given need for dialysis which is due today. This felt at this juncture patient did not have sepsis etiology to his altered mentation and other etiologies were   pursued.   HOSPITAL COURSE:   1. Acute encephalopathy-Slightly improved, waxing and waning mental status, was Improving after stopping Keppra,now on vimpat- actually more responsive than was on admission,  . EEG showed no epileptiform discharges.   MRI brain done  after giving 2 mg IV Ativan which showed no significant abnormality. Neurontin has been discontinued by nephrology. Swallow evaluation done patient was started on full liquid diet.Final speech recommendations pending ,  Neurology  following. Chest x-ray negative for pneumonia, Ammonia level was 45, this should be repeated again in 3-5 days   2. Seizure disorder- EEG negative for any epileptiform discharges, patient has been on Keppra, Keppra level 37.9, Keppra has been discontinued and patient started on Vimpat.   3. ? UTI- patient was started on empiric  ceftriaxone, urine culture shows no growth. Discontinued ceftriaxone.   4. ESRD on hemodialysis-patient on Tuesday Thursday Saturday hemodialysis. Nephrology following. Dry weight down 6 kg,  IV fluids., hemodialysis today   5. Prolonged QT interval- chronic,  checked serum magnesium 2.2. Avoid Haldol Zofran and fluoroquinolones.   6. Vomiting- unclear etiology, LFT's were NL,UA negative ,   Was noted to have some coffeee ground emesis without change in Hg , started on PPI   Discharge Exam:   Blood pressure (!) 175/63, pulse 73, temperature 98.7 F (37.1 C), temperature source Oral, resp. rate 15, height 5' 10"  (1.778 m), weight 48.5 kg (106 lb 14.8 oz), SpO2 96 %.  General exam: Appears calm and comfortable  Respiratory system: Clear to auscultation. Respiratory effort normal. Cardiovascular system: S1 & S2 heard, RRR. No JVD, murmurs, rubs, gallops or clicks. No pedal edema. Gastrointestinal system: Abdomen is nondistended, soft and nontender. No organomegaly or masses felt. Normal bowel sounds heard. Central nervous system: Alert and oriented x 1. No focal neurological deficits. Extremities: Symmetric 5 x 5 power. Skin: No rashes, lesions or ulcers Psychiatry: Judgement and insight appear normal. Mood & affect appropriate.    Follow-up Information    pcp. Schedule an appointment as soon as possible for a visit in 2 day(s).   Why:  hospital  follow up          Signed: Chanda Laperle 01/01/2016, 11:02 AM        Time spent >45 mins

## 2016-01-01 NOTE — Progress Notes (Addendum)
Report given to Nurse at [facility]. Patient ready for transport back to the facility.   18:55pm Patient discharged to [facility] via PTR. IV removed. Telemetry removed. Patient left the unit on stretcher in stable condition.  Sheliah Plane RN

## 2016-01-01 NOTE — Procedures (Signed)
Patient seen on Hemodialysis.  Still a bit encephalopathic.  Below EDW. QB 400, UF goal 1.5L Treatment adjusted as needed.  [name]. Office # [phone] Pager # [phone] 8:24 AM

## 2016-01-01 NOTE — Progress Notes (Signed)
Patient seen in the dialysis Center today. He continues to be much more than awake he was previously. I suspect that he may have some underlying cognitive issues. I do not have a clear reason other than multifactorial delirium for him to have had his encephalopathy with this admission, but he appears to be steadily improving at this point.. If he continues to have significant lethargy, then could consider trying low-dose of a stimulant such as Provigil. No other further recommendations for workup at this time. Please call with further questions or concerns.  Roland Rack, MD [facility] [phone]  If 7pm- 7am, please page neurology on call as listed in [place].

## 2016-01-01 NOTE — Progress Notes (Signed)
[facility] Progress Note   Subjective:    Mental status still waxing and waning, apparently improved.  Objective Vitals:   01/01/16 1000 01/01/16 1030 01/01/16 1100 01/01/16 1126  BP: (!) 154/63 (!) 175/63 (!) 165/68 (!) 174/74  Pulse: 73 73 75 74  Resp: 15 15 19 18   Temp:    98.2 F (36.8 C)  TempSrc:    Oral  SpO2:    100%  Weight:    46.5 kg (102 lb 8.2 oz)  Height:       Physical Exam General: Chronically ill appearing male-more alert today. NAD Heart: [id number] 2/6 systolic M RRR Lungs: BBS CTA A/P. No WOB Abdomen: Abdomen soft, nontender Extremities:R BKA with staples intact/no drainage. No LLE edema Dialysis Access: LUA AVF +Bruit  Dialysis Orders:[facility] TTS  4h 2K/2.25 Ca 400/auto 1.5 UF Profile2 EDW 56kg LAVF  No Heparin  Hectorol 7 mcg IV q treatment  Mircera 167mcg IV q 2 weeks (last dose 150 mcg IV8/26/17)  OP Labs: Hgb 11.2 Tsat 54% K 4.7 Corr Ca 10.3 iPTH 73 Additional Objective Labs: Basic Metabolic Panel:  Recent Labs Lab 12/27/15 0811 12/30/15 0833 01/01/16 0724  NA 138 134* 136  K 4.1 3.7 4.2  CL 98* 96* 100*  CO2 27 27 28   GLUCOSE 87 106* 107*  BUN 18 19 19   CREATININE 6.05* 5.88* 4.77*  CALCIUM 10.5* 9.4 9.3  PHOS 6.5* 5.3* 4.7*   Liver Function Tests:  Recent Labs Lab 12/27/15 0811 12/30/15 0833 01/01/16 0724  ALBUMIN 2.9* 2.7* 2.7*   CBC:  Recent Labs Lab 12/27/15 0811 12/30/15 1652 01/01/16 0500  WBC 7.4 3.7* 4.8  NEUTROABS  --  2.5  --   HGB 12.2* 10.9* 9.8*  HCT 40.1 35.1* 32.4*  MCV 92.0 91.2 91.5  PLT 141* 132* 151   Blood Culture    Component Value Date/Time   SDES URINE, CATHETERIZED 12/23/2015 1057   SPECREQUEST NONE 12/23/2015 1057   CULT NO GROWTH 12/23/2015 1057   REPTSTATUS 12/24/2015 FINAL 12/23/2015 1057    CBG:  Recent Labs Lab 12/31/15 1625 12/31/15 2019 01/01/16 0012 01/01/16 0421 01/01/16 1231  GLUCAP 79 81 92 79 72   Iron Studies: No results for input(s): IRON,  TIBC, TRANSFERRIN, FERRITIN in the last 72 hours. @lablastinr3 @ Studies/Results: No results found. Medications:   . aspirin  81 mg Oral Daily  . atorvastatin  40 mg Oral q1800  . feeding supplement  1 Container Oral BID BM  . feeding supplement (PRO-STAT SUGAR FREE 64)  30 mL Oral BID BM  . heparin  5,000 Units Subcutaneous Q8H  . hydrALAZINE  100 mg Oral Q8H  . labetalol  200 mg Oral Q8H  . lacosamide  100 mg Oral BID  . losartan  100 mg Oral QHS  . multivitamin  1 tablet Oral QHS  . pantoprazole  40 mg Oral Daily  . sevelamer carbonate  2.4 g Oral TID WC  . sodium chloride flush  3 mL Intravenous Q12H     Assessment/Plan: 1. Altered mental status - recurrent issue,w/u per primary -MRI no acute and EEG normal, drug screen negative. Better ? after 24 hours off of Keppra.  Continue to observe off Keppra, on vimpat- improving.  2.  ESRD- T,TH,S at [facility].Next HD 01/03/16. K+ 3.7 4.0 K bath/2.25 Ca bath.  3. Anemia- Hgb down 10.9- No ESA yet. Follow HGB 4. Secondary hyperparathyroidism- Ca 9.4C Ca 11.4 Phos 5.3. Start renvela powder binders, No VDRA.  5.HTN -  BP's better back on home meds- hydralazine, labetalol, losartan 6. Volume-  Last wt 49.4 kg. 6.6 kg below EDW. Will need new EDW. 7. Nutrition- Alb 2.7. Poor PO intake.  Dysphagia 1 diet/thin liquids. Renal vit/supplement. Add prostat.  7. H/O Seizures:on Vimpat now, see #1 above. No seizure activity reported.  8. Dispo- consider a palliative care consult

## 2016-02-20 ENCOUNTER — Other Ambulatory Visit: Payer: Self-pay

## 2016-02-20 DIAGNOSIS — T82510A Breakdown (mechanical) of surgically created arteriovenous fistula, initial encounter: Secondary | ICD-10-CM

## 2016-02-23 ENCOUNTER — Inpatient Hospital Stay (HOSPITAL_COMMUNITY): Payer: Medicare Other

## 2016-02-23 ENCOUNTER — Encounter (HOSPITAL_COMMUNITY): Payer: Medicare Other

## 2016-02-23 ENCOUNTER — Inpatient Hospital Stay (HOSPITAL_COMMUNITY)
Admission: EM | Admit: 2016-02-23 | Discharge: 2016-03-19 | DRG: 296 | Disposition: E | Payer: Medicare Other | Attending: Pulmonary Disease | Admitting: Pulmonary Disease

## 2016-02-23 ENCOUNTER — Encounter (HOSPITAL_COMMUNITY): Payer: Self-pay | Admitting: Vascular Surgery

## 2016-02-23 ENCOUNTER — Emergency Department (HOSPITAL_COMMUNITY): Payer: Medicare Other

## 2016-02-23 DIAGNOSIS — Z8249 Family history of ischemic heart disease and other diseases of the circulatory system: Secondary | ICD-10-CM | POA: Diagnosis not present

## 2016-02-23 DIAGNOSIS — N2581 Secondary hyperparathyroidism of renal origin: Secondary | ICD-10-CM | POA: Diagnosis present

## 2016-02-23 DIAGNOSIS — D638 Anemia in other chronic diseases classified elsewhere: Secondary | ICD-10-CM | POA: Diagnosis present

## 2016-02-23 DIAGNOSIS — F17210 Nicotine dependence, cigarettes, uncomplicated: Secondary | ICD-10-CM | POA: Diagnosis present

## 2016-02-23 DIAGNOSIS — Z96652 Presence of left artificial knee joint: Secondary | ICD-10-CM | POA: Diagnosis present

## 2016-02-23 DIAGNOSIS — I468 Cardiac arrest due to other underlying condition: Secondary | ICD-10-CM | POA: Diagnosis present

## 2016-02-23 DIAGNOSIS — I5032 Chronic diastolic (congestive) heart failure: Secondary | ICD-10-CM | POA: Diagnosis present

## 2016-02-23 DIAGNOSIS — Z823 Family history of stroke: Secondary | ICD-10-CM | POA: Diagnosis not present

## 2016-02-23 DIAGNOSIS — J9601 Acute respiratory failure with hypoxia: Secondary | ICD-10-CM

## 2016-02-23 DIAGNOSIS — B1920 Unspecified viral hepatitis C without hepatic coma: Secondary | ICD-10-CM | POA: Diagnosis present

## 2016-02-23 DIAGNOSIS — I469 Cardiac arrest, cause unspecified: Secondary | ICD-10-CM | POA: Diagnosis present

## 2016-02-23 DIAGNOSIS — I4581 Long QT syndrome: Secondary | ICD-10-CM | POA: Diagnosis present

## 2016-02-23 DIAGNOSIS — Z66 Do not resuscitate: Secondary | ICD-10-CM | POA: Diagnosis not present

## 2016-02-23 DIAGNOSIS — Z888 Allergy status to other drugs, medicaments and biological substances status: Secondary | ICD-10-CM

## 2016-02-23 DIAGNOSIS — E874 Mixed disorder of acid-base balance: Secondary | ICD-10-CM | POA: Diagnosis present

## 2016-02-23 DIAGNOSIS — Z809 Family history of malignant neoplasm, unspecified: Secondary | ICD-10-CM

## 2016-02-23 DIAGNOSIS — Z992 Dependence on renal dialysis: Secondary | ICD-10-CM

## 2016-02-23 DIAGNOSIS — N186 End stage renal disease: Secondary | ICD-10-CM | POA: Diagnosis present

## 2016-02-23 DIAGNOSIS — Z833 Family history of diabetes mellitus: Secondary | ICD-10-CM | POA: Diagnosis not present

## 2016-02-23 DIAGNOSIS — Z7982 Long term (current) use of aspirin: Secondary | ICD-10-CM | POA: Diagnosis not present

## 2016-02-23 DIAGNOSIS — R570 Cardiogenic shock: Secondary | ICD-10-CM

## 2016-02-23 DIAGNOSIS — Z9115 Patient's noncompliance with renal dialysis: Secondary | ICD-10-CM | POA: Diagnosis not present

## 2016-02-23 DIAGNOSIS — G931 Anoxic brain damage, not elsewhere classified: Secondary | ICD-10-CM

## 2016-02-23 DIAGNOSIS — E1122 Type 2 diabetes mellitus with diabetic chronic kidney disease: Secondary | ICD-10-CM | POA: Diagnosis present

## 2016-02-23 DIAGNOSIS — E785 Hyperlipidemia, unspecified: Secondary | ICD-10-CM | POA: Diagnosis present

## 2016-02-23 DIAGNOSIS — M898X9 Other specified disorders of bone, unspecified site: Secondary | ICD-10-CM | POA: Diagnosis present

## 2016-02-23 DIAGNOSIS — E875 Hyperkalemia: Secondary | ICD-10-CM | POA: Diagnosis not present

## 2016-02-23 DIAGNOSIS — G40909 Epilepsy, unspecified, not intractable, without status epilepticus: Secondary | ICD-10-CM | POA: Diagnosis present

## 2016-02-23 DIAGNOSIS — R402433 Glasgow coma scale score 3-8, at hospital admission: Secondary | ICD-10-CM | POA: Diagnosis present

## 2016-02-23 DIAGNOSIS — Z89511 Acquired absence of right leg below knee: Secondary | ICD-10-CM

## 2016-02-23 DIAGNOSIS — E1165 Type 2 diabetes mellitus with hyperglycemia: Secondary | ICD-10-CM | POA: Diagnosis present

## 2016-02-23 DIAGNOSIS — R001 Bradycardia, unspecified: Secondary | ICD-10-CM | POA: Diagnosis present

## 2016-02-23 DIAGNOSIS — Z515 Encounter for palliative care: Secondary | ICD-10-CM | POA: Diagnosis not present

## 2016-02-23 DIAGNOSIS — I132 Hypertensive heart and chronic kidney disease with heart failure and with stage 5 chronic kidney disease, or end stage renal disease: Secondary | ICD-10-CM | POA: Diagnosis present

## 2016-02-23 DIAGNOSIS — Z841 Family history of disorders of kidney and ureter: Secondary | ICD-10-CM

## 2016-02-23 DIAGNOSIS — G9340 Encephalopathy, unspecified: Secondary | ICD-10-CM

## 2016-02-23 LAB — I-STAT ARTERIAL BLOOD GAS, ED
ACID-BASE DEFICIT: 3 mmol/L — AB (ref 0.0–2.0)
BICARBONATE: 23.2 mmol/L (ref 20.0–28.0)
O2 Saturation: 99 %
PH ART: 7.388 (ref 7.350–7.450)
TCO2: 25 mmol/L (ref 0–100)
pCO2 arterial: 36.7 mmHg (ref 32.0–48.0)
pO2, Arterial: 125 mmHg — ABNORMAL HIGH (ref 83.0–108.0)

## 2016-02-23 LAB — RENAL FUNCTION PANEL
ALBUMIN: 3.1 g/dL — AB (ref 3.5–5.0)
ANION GAP: 13 (ref 5–15)
BUN: 73 mg/dL — ABNORMAL HIGH (ref 6–20)
CALCIUM: 8.6 mg/dL — AB (ref 8.9–10.3)
CO2: 25 mmol/L (ref 22–32)
Chloride: 100 mmol/L — ABNORMAL LOW (ref 101–111)
Creatinine, Ser: 6.54 mg/dL — ABNORMAL HIGH (ref 0.61–1.24)
GFR calc Af Amer: 10 mL/min — ABNORMAL LOW (ref >60)
GFR calc non Af Amer: 8 mL/min — ABNORMAL LOW (ref >60)
GLUCOSE: 45 mg/dL — AB (ref 65–99)
PHOSPHORUS: 6.5 mg/dL — AB (ref 2.5–4.6)
Potassium: 5.1 mmol/L (ref 3.5–5.1)
SODIUM: 138 mmol/L (ref 135–145)

## 2016-02-23 LAB — GLUCOSE, CAPILLARY
GLUCOSE-CAPILLARY: 131 mg/dL — AB (ref 65–99)
Glucose-Capillary: 43 mg/dL — CL (ref 65–99)
Glucose-Capillary: 122 mg/dL — ABNORMAL HIGH (ref 65–99)
Glucose-Capillary: 77 mg/dL (ref 65–99)

## 2016-02-23 LAB — BLOOD GAS, ARTERIAL
Acid-base deficit: 0.6 mmol/L (ref 0.0–2.0)
Bicarbonate: 24 mmol/L (ref 20.0–28.0)
Drawn by: 448981
FIO2: 80
O2 Saturation: 95.3 %
PATIENT TEMPERATURE: 88.7
PCO2 ART: 33.1 mmHg (ref 32.0–48.0)
PEEP: 10 cmH2O
PO2 ART: 85.4 mmHg (ref 83.0–108.0)
RATE: 16 resp/min
VT: 600 mL
pH, Arterial: 7.443 (ref 7.350–7.450)

## 2016-02-23 LAB — PROCALCITONIN: PROCALCITONIN: 0.5 ng/mL

## 2016-02-23 LAB — COMPREHENSIVE METABOLIC PANEL
ALK PHOS: 85 U/L (ref 38–126)
ALT: 111 U/L — AB (ref 17–63)
ALT: 115 U/L — ABNORMAL HIGH (ref 17–63)
ANION GAP: 18 — AB (ref 5–15)
AST: 218 U/L — AB (ref 15–41)
AST: 231 U/L — ABNORMAL HIGH (ref 15–41)
Albumin: 2.6 g/dL — ABNORMAL LOW (ref 3.5–5.0)
Albumin: 2.7 g/dL — ABNORMAL LOW (ref 3.5–5.0)
Alkaline Phosphatase: 94 U/L (ref 38–126)
Anion gap: 19 — ABNORMAL HIGH (ref 5–15)
BILIRUBIN TOTAL: 0.6 mg/dL (ref 0.3–1.2)
BUN: 91 mg/dL — AB (ref 6–20)
BUN: 86 mg/dL — ABNORMAL HIGH (ref 6–20)
CALCIUM: 9.1 mg/dL (ref 8.9–10.3)
CHLORIDE: 95 mmol/L — AB (ref 101–111)
CO2: 19 mmol/L — ABNORMAL LOW (ref 22–32)
CO2: 21 mmol/L — AB (ref 22–32)
CREATININE: 8.98 mg/dL — AB (ref 0.61–1.24)
Calcium: 8.9 mg/dL (ref 8.9–10.3)
Chloride: 95 mmol/L — ABNORMAL LOW (ref 101–111)
Creatinine, Ser: 8.53 mg/dL — ABNORMAL HIGH (ref 0.61–1.24)
GFR calc Af Amer: 7 mL/min — ABNORMAL LOW (ref >60)
GFR, EST AFRICAN AMERICAN: 7 mL/min — AB (ref >60)
GFR, EST NON AFRICAN AMERICAN: 6 mL/min — AB (ref >60)
GFR, EST NON AFRICAN AMERICAN: 6 mL/min — AB (ref >60)
Glucose, Bld: 252 mg/dL — ABNORMAL HIGH (ref 65–99)
Glucose, Bld: 361 mg/dL — ABNORMAL HIGH (ref 65–99)
Potassium: >7.5 mmol/L (ref 3.5–5.1)
Potassium: >7.5 mmol/L (ref 3.5–5.1)
SODIUM: 134 mmol/L — AB (ref 135–145)
Sodium: 133 mmol/L — ABNORMAL LOW (ref 135–145)
TOTAL PROTEIN: 5.9 g/dL — AB (ref 6.5–8.1)
Total Bilirubin: 0.6 mg/dL (ref 0.3–1.2)
Total Protein: 5.6 g/dL — ABNORMAL LOW (ref 6.5–8.1)

## 2016-02-23 LAB — I-STAT CHEM 8, ED
BUN: 95 mg/dL — ABNORMAL HIGH (ref 6–20)
CALCIUM ION: 1.03 mmol/L — AB (ref 1.15–1.40)
CHLORIDE: 103 mmol/L (ref 101–111)
Creatinine, Ser: 8.8 mg/dL — ABNORMAL HIGH (ref 0.61–1.24)
Glucose, Bld: 237 mg/dL — ABNORMAL HIGH (ref 65–99)
HEMATOCRIT: 23 % — AB (ref 39.0–52.0)
HEMOGLOBIN: 7.8 g/dL — AB (ref 13.0–17.0)
POTASSIUM: 8.2 mmol/L — AB (ref 3.5–5.1)
SODIUM: 130 mmol/L — AB (ref 135–145)
TCO2: 20 mmol/L (ref 0–100)

## 2016-02-23 LAB — DIFFERENTIAL
Basophils Absolute: 0.1 10*3/uL (ref 0.0–0.1)
Basophils Relative: 1 %
EOS ABS: 0.2 10*3/uL (ref 0.0–0.7)
EOS PCT: 2 %
LYMPHS ABS: 2.9 10*3/uL (ref 0.7–4.0)
Lymphocytes Relative: 23 %
MONOS PCT: 5 %
Monocytes Absolute: 0.7 10*3/uL (ref 0.1–1.0)
NEUTROS PCT: 69 %
Neutro Abs: 8.6 10*3/uL — ABNORMAL HIGH (ref 1.7–7.7)

## 2016-02-23 LAB — CBC
HCT: 24 % — ABNORMAL LOW (ref 39.0–52.0)
HCT: 21.4 % — ABNORMAL LOW (ref 39.0–52.0)
HEMOGLOBIN: 7.5 g/dL — AB (ref 13.0–17.0)
Hemoglobin: 6.8 g/dL — CL (ref 13.0–17.0)
MCH: 28.2 pg (ref 26.0–34.0)
MCH: 27.9 pg (ref 26.0–34.0)
MCHC: 31.3 g/dL (ref 30.0–36.0)
MCHC: 31.8 g/dL (ref 30.0–36.0)
MCV: 90.2 fL (ref 78.0–100.0)
MCV: 87.7 fL (ref 78.0–100.0)
PLATELETS: 184 10*3/uL (ref 150–400)
Platelets: 255 10*3/uL (ref 150–400)
RBC: 2.66 MIL/uL — ABNORMAL LOW (ref 4.22–5.81)
RBC: 2.44 MIL/uL — ABNORMAL LOW (ref 4.22–5.81)
RDW: 18.3 % — ABNORMAL HIGH (ref 11.5–15.5)
RDW: 17.7 % — AB (ref 11.5–15.5)
WBC: 12.4 10*3/uL — ABNORMAL HIGH (ref 4.0–10.5)
WBC: 9.3 10*3/uL (ref 4.0–10.5)

## 2016-02-23 LAB — LIPID PANEL
Cholesterol: 99 mg/dL (ref 0–200)
HDL: 52 mg/dL (ref >40)
LDL Cholesterol: 35 mg/dL (ref 0–99)
Total CHOL/HDL Ratio: 1.9 RATIO
Triglycerides: 61 mg/dL (ref <150)
VLDL: 12 mg/dL (ref 0–40)

## 2016-02-23 LAB — CORTISOL: CORTISOL PLASMA: 24.1 ug/dL

## 2016-02-23 LAB — APTT
APTT: 31 s (ref 24–36)
aPTT: 33 seconds (ref 24–36)

## 2016-02-23 LAB — MRSA PCR SCREENING: MRSA by PCR: POSITIVE — AB

## 2016-02-23 LAB — PROTIME-INR
INR: 1.12
INR: 1.11
Prothrombin Time: 14.5 seconds (ref 11.4–15.2)
Prothrombin Time: 14.4 seconds (ref 11.4–15.2)

## 2016-02-23 LAB — PHOSPHORUS: PHOSPHORUS: 10.8 mg/dL — AB (ref 2.5–4.6)

## 2016-02-23 LAB — TROPONIN I: TROPONIN I: 0.03 ng/mL — AB (ref <0.03)

## 2016-02-23 LAB — I-STAT TROPONIN, ED: TROPONIN I, POC: 0.04 ng/mL (ref 0.00–0.08)

## 2016-02-23 LAB — MAGNESIUM
MAGNESIUM: 2.9 mg/dL — AB (ref 1.7–2.4)
MAGNESIUM: 2.8 mg/dL — AB (ref 1.7–2.4)

## 2016-02-23 LAB — COOXEMETRY PANEL
CARBOXYHEMOGLOBIN: 0.7 % (ref 0.5–1.5)
Methemoglobin: 1.5 % (ref 0.0–1.5)
O2 Saturation: 55.9 %
Total hemoglobin: 7.8 g/dL — ABNORMAL LOW (ref 12.0–16.0)

## 2016-02-23 LAB — D-DIMER, QUANTITATIVE: D-Dimer, Quant: 7.92 ug/mL-FEU — ABNORMAL HIGH (ref 0.00–0.50)

## 2016-02-23 LAB — TRIGLYCERIDES: TRIGLYCERIDES: 58 mg/dL (ref <150)

## 2016-02-23 LAB — BRAIN NATRIURETIC PEPTIDE
B Natriuretic Peptide: 497 pg/mL — ABNORMAL HIGH (ref 0.0–100.0)
B Natriuretic Peptide: 433.2 pg/mL — ABNORMAL HIGH (ref 0.0–100.0)

## 2016-02-23 MED ORDER — HEPARIN SODIUM (PORCINE) 1000 UNIT/ML DIALYSIS
1000.0000 [IU] | INTRAMUSCULAR | Status: DC | PRN
  Filled 2016-02-23: qty 6

## 2016-02-23 MED ORDER — SODIUM CHLORIDE 0.9 % IV SOLN: 250.0000 mL | INTRAVENOUS | Status: DC | PRN

## 2016-02-23 MED ORDER — DEXTROSE 50 % IV SOLN
1.0000 | Freq: Once | INTRAVENOUS | Status: AC
  Administered 2016-02-23: 50 mL via INTRAVENOUS

## 2016-02-23 MED ORDER — PROPOFOL 1000 MG/100ML IV EMUL
5.0000 ug/kg/min | INTRAVENOUS | Status: DC
  Administered 2016-02-23: 20 ug/kg/min via INTRAVENOUS

## 2016-02-23 MED ORDER — PROPOFOL 1000 MG/100ML IV EMUL
5.0000 ug/kg/min | INTRAVENOUS | Status: DC
  Administered 2016-02-23: 60 ug/kg/min via INTRAVENOUS
  Administered 2016-02-23: 40 ug/kg/min via INTRAVENOUS
  Administered 2016-02-23 – 2016-02-24 (×3): 60 ug/kg/min via INTRAVENOUS
  Filled 2016-02-23 (×4): qty 100

## 2016-02-23 MED ORDER — SODIUM BICARBONATE 8.4 % IV SOLN
50.0000 meq | Freq: Once | INTRAVENOUS | Status: AC
  Administered 2016-02-23: 50 meq via INTRAVENOUS

## 2016-02-23 MED ORDER — PRISMASOL BGK 0/2.5 32-2.5 MEQ/L IV SOLN
INTRAVENOUS | Status: DC
  Administered 2016-02-23 – 2016-02-24 (×3): via INTRAVENOUS_CENTRAL
  Filled 2016-02-23 (×4): qty 5000

## 2016-02-23 MED ORDER — HEPARIN BOLUS VIA INFUSION (CRRT)
1000.0000 [IU] | INTRAVENOUS | Status: DC | PRN
  Filled 2016-02-23: qty 1000

## 2016-02-23 MED ORDER — ASPIRIN 81 MG PO CHEW: 324.0000 mg | CHEWABLE_TABLET | ORAL | Status: DC

## 2016-02-23 MED ORDER — INSULIN ASPART 100 UNIT/ML ~~LOC~~ SOLN
2.0000 [IU] | SUBCUTANEOUS | Status: DC
  Administered 2016-02-23: 2 [IU] via SUBCUTANEOUS

## 2016-02-23 MED ORDER — SODIUM BICARBONATE 4 % IV SOLN
5.0000 mL | Freq: Once | INTRAVENOUS | Status: DC
  Filled 2016-02-23: qty 5

## 2016-02-23 MED ORDER — DEXTROSE 50 % IV SOLN
INTRAVENOUS | Status: AC
  Filled 2016-02-23: qty 50

## 2016-02-23 MED ORDER — LORAZEPAM 2 MG/ML IJ SOLN
4.0000 mg | Freq: Once | INTRAMUSCULAR | Status: AC
  Administered 2016-02-23: 4 mg via INTRAVENOUS
  Filled 2016-02-23: qty 2

## 2016-02-23 MED ORDER — HEPARIN SODIUM (PORCINE) 1000 UNIT/ML DIALYSIS
1000.0000 [IU] | INTRAMUSCULAR | Status: DC | PRN
Start: 2016-02-23 — End: 2016-02-24
  Filled 2016-02-23: qty 3
  Filled 2016-02-23: qty 6

## 2016-02-23 MED ORDER — MIDAZOLAM HCL 2 MG/2ML IJ SOLN
INTRAMUSCULAR | Status: AC
  Filled 2016-02-23: qty 4

## 2016-02-23 MED ORDER — HEPARIN (PORCINE) 2000 UNITS/L FOR CRRT
INTRAVENOUS_CENTRAL | Status: DC | PRN
  Administered 2016-02-23: 11:00:00 via INTRAVENOUS_CENTRAL
  Filled 2016-02-23 (×2): qty 1000

## 2016-02-23 MED ORDER — FENTANYL CITRATE (PF) 100 MCG/2ML IJ SOLN: 50.0000 ug | INTRAMUSCULAR | Status: DC | PRN

## 2016-02-23 MED ORDER — INSULIN ASPART 100 UNIT/ML IV SOLN
10.0000 [IU] | Freq: Once | INTRAVENOUS | Status: AC
  Administered 2016-02-23: 10 [IU] via INTRAVENOUS
  Filled 2016-02-23: qty 1

## 2016-02-23 MED ORDER — SODIUM CHLORIDE 0.9 % IV SOLN
1.0000 g | Freq: Once | INTRAVENOUS | Status: DC
  Filled 2016-02-23: qty 10

## 2016-02-23 MED ORDER — SODIUM CHLORIDE 0.9 % IJ SOLN
250.0000 [IU]/h | INTRAMUSCULAR | Status: DC
  Filled 2016-02-23: qty 2

## 2016-02-23 MED ORDER — MIDAZOLAM HCL 5 MG/5ML IJ SOLN
INTRAMUSCULAR | Status: AC | PRN
  Administered 2016-02-23: 4 mg via INTRAVENOUS

## 2016-02-23 MED ORDER — PROPOFOL 1000 MG/100ML IV EMUL
INTRAVENOUS | Status: AC
  Filled 2016-02-23: qty 100

## 2016-02-23 MED ORDER — CALCIUM GLUCONATE 10 % IV SOLN
1.0000 g | Freq: Once | INTRAVENOUS | Status: AC
  Administered 2016-02-23: 1 g via INTRAVENOUS
  Filled 2016-02-23: qty 10

## 2016-02-23 MED ORDER — LEVETIRACETAM 500 MG/5ML IV SOLN
500.0000 mg | Freq: Two times a day (BID) | INTRAVENOUS | Status: DC
  Administered 2016-02-23 – 2016-02-24 (×2): 500 mg via INTRAVENOUS
  Filled 2016-02-23 (×4): qty 5

## 2016-02-23 MED ORDER — SODIUM CHLORIDE 0.9 % IV SOLN
INTRAVENOUS | Status: DC
  Administered 2016-02-23 – 2016-02-24 (×2): via INTRAVENOUS

## 2016-02-23 MED ORDER — CHLORHEXIDINE GLUCONATE CLOTH 2 % EX PADS
6.0000 | MEDICATED_PAD | Freq: Every day | CUTANEOUS | Status: DC
  Administered 2016-02-24: 6 via TOPICAL

## 2016-02-23 MED ORDER — ALBUTEROL SULFATE (2.5 MG/3ML) 0.083% IN NEBU
10.0000 mg | INHALATION_SOLUTION | Freq: Once | RESPIRATORY_TRACT | Status: AC
Start: 2016-02-23 — End: 2016-02-23
  Administered 2016-02-23: 10 mg via RESPIRATORY_TRACT
  Filled 2016-02-23: qty 12

## 2016-02-23 MED ORDER — MUPIROCIN 2 % EX OINT
1.0000 "application " | TOPICAL_OINTMENT | Freq: Two times a day (BID) | CUTANEOUS | Status: DC
  Administered 2016-02-23 – 2016-02-24 (×3): 1 via NASAL
  Filled 2016-02-23: qty 22

## 2016-02-23 MED ORDER — ASPIRIN 300 MG RE SUPP: 300.0000 mg | RECTAL | Status: DC

## 2016-02-23 MED ORDER — PRISMASOL BGK 0/2.5 32-2.5 MEQ/L IV SOLN
INTRAVENOUS | Status: DC
  Administered 2016-02-23 – 2016-02-24 (×2): via INTRAVENOUS_CENTRAL
  Filled 2016-02-23 (×3): qty 5000

## 2016-02-23 MED ORDER — NOREPINEPHRINE BITARTRATE 1 MG/ML IV SOLN
0.0000 ug/min | INTRAVENOUS | Status: DC
  Administered 2016-02-23: 5.013 ug/min via INTRAVENOUS
  Filled 2016-02-23: qty 4

## 2016-02-23 MED ORDER — ORAL CARE MOUTH RINSE
15.0000 mL | OROMUCOSAL | Status: DC
  Administered 2016-02-23 – 2016-02-24 (×10): 15 mL via OROMUCOSAL

## 2016-02-23 MED ORDER — MIDAZOLAM HCL 2 MG/2ML IJ SOLN
4.0000 mg | INTRAMUSCULAR | Status: DC | PRN
  Administered 2016-02-23 (×2): 4 mg via INTRAVENOUS
  Filled 2016-02-23 (×2): qty 4

## 2016-02-23 MED ORDER — DEXTROSE 50 % IV SOLN
1.0000 | Freq: Once | INTRAVENOUS | Status: AC
  Administered 2016-02-23: 50 mL via INTRAVENOUS
  Filled 2016-02-23: qty 50

## 2016-02-23 MED ORDER — SODIUM CHLORIDE 0.9 % IV SOLN
10.0000 mL/h | INTRAVENOUS | Status: DC
  Administered 2016-02-23: 20 mL/h via INTRAVENOUS

## 2016-02-23 MED ORDER — CHLORHEXIDINE GLUCONATE 0.12% ORAL RINSE (MEDLINE KIT)
15.0000 mL | Freq: Two times a day (BID) | OROMUCOSAL | Status: DC
  Administered 2016-02-23 – 2016-02-24 (×3): 15 mL via OROMUCOSAL

## 2016-02-23 MED ORDER — NOREPINEPHRINE BITARTRATE 1 MG/ML IV SOLN
5.0000 ug/min | INTRAVENOUS | Status: DC
  Administered 2016-02-23: 5.013 ug/min via INTRAVENOUS
  Filled 2016-02-23: qty 4

## 2016-02-23 MED ORDER — PRISMASOL BGK 0/2.5 32-2.5 MEQ/L IV SOLN
INTRAVENOUS | Status: DC
  Administered 2016-02-23 – 2016-02-24 (×7): via INTRAVENOUS_CENTRAL
  Filled 2016-02-23 (×12): qty 5000

## 2016-02-23 MED ORDER — SUCRALFATE 1 GM/10ML PO SUSP
1.0000 g | Freq: Four times a day (QID) | ORAL | Status: DC
  Administered 2016-02-23 – 2016-02-24 (×3): 1 g via ORAL
  Filled 2016-02-23 (×5): qty 10

## 2016-02-23 MED ORDER — ACETAMINOPHEN 325 MG PO TABS: 650.0000 mg | ORAL_TABLET | ORAL | Status: DC | PRN

## 2016-02-23 MED FILL — Medication: Qty: 1 | Status: AC

## 2016-02-23 NOTE — ED Notes (Addendum)
Patient placed in gown, on zoll pads, on monitor, continuous pulse oximetry and blood pressure cuff; airway being maintained by RT

## 2016-02-23 NOTE — ED Notes (Addendum)
Pt transferred from EMS zoll pacing to our zoll for external pacing. Currently being paced at 70 bpm at an amplitude of 100. Capture achieved. Pt tolerating pacing well. Pulses present correlating with pacing spikes.

## 2016-02-23 NOTE — Progress Notes (Signed)
EEG Completed; Results Pending  

## 2016-02-23 NOTE — Consult Note (Signed)
[name] Renal Consultation Note    Indication for Consultation:  Management of ESRD/hemodialysis; anemia, hypertension/volume and secondary hyperparathyroidism  HPI: [name] is a 60 y.o. male.  [name] has an extensive PMH most notable for cocaine abuse, CAD, DM, chronic diastolic CHF, ESRD, and medical noncompliance who presented to [facility] ED after being found unresponsive at [facility] staff.  EMS was called and found to have PEA arrest and underwent 20 minutes of ACLS and was placed on an external pacer.  He was started on levophed drip and labs revealed a potassium of 8.2.  He signed off an hour early on Thursday and did not go to HD on Saturday (he has done this several times in the past).  We were asked to help provide urgent/emergent dialysis in setting of cardiogenic shock and likely need for CRRT.  Past Medical History:  Diagnosis Date  . Anemia, chronic disease    a. Capsule endoscopy reportedly negative 08/2013. b. seen by heme with concern for minor B cell population on BMB, being observed.  . Arthritis    "right shoulder" (11/09/2012)  . Chronic diastolic CHF (congestive heart failure) ([place])    a. EF 60 - 65% per [place] echo 11/2011. b. Echo 02/2012: severe LVH, focal basal hypertrophy, EF 60-65%, mild Mr.  . Cocaine abuse    mentioned in notes from [place]  . Diabetes mellitus without complication ([place])   . ESRD (end stage renal disease) on dialysis [facility])    "TTS; [address]" (06/19/2015)  . ESRD on hemodialysis ([place]) since 2012   a. Since 2012. ESRD was due to "drugs", primarily used cocaine.  Has 3-5 year hx of HTN, no DM.  Gets HD on TTS schedule at [place]. Originally from [place].   . GI bleed 05/04/2012  . Head injury, closed, with concussion   . Headache(784.0)   . Helicobacter pylori gastritis    not defined if this was treated  . Hematochezia   . Hepatitis B core antibody positive    03/01/10  . Hepatitis C antibody test positive    was  HIV negative, 02/28/12  . History of blood transfusion   . Hypercalcemia   . Hyperpotassemia   . Hypertension   . Hypertensive heart disease   . Optic neuritis, left   . Polyp of colon, adenomatous    May 2012.  Dr Trenton Founds in [place]  . Positive QuantiFERON-TB Gold test    11/2011  . Protein-calorie malnutrition, severe ([place])   . Seizure disorder [facility])    questionable history of - will need to clarify with PCP  . Tobacco abuse    Past Surgical History:  Procedure Laterality Date  . [place] TRANSPOSITION  03/07/2012   Procedure: BASCILIC VEIN TRANSPOSITION;  Surgeon: Conrad [place], MD;  Location: [place];  Service: Vascular;  Laterality: Left;  First Stage  . BASCILIC VEIN TRANSPOSITION Left 05/31/2012   Procedure: BASCILIC VEIN TRANSPOSITION;  Surgeon: Conrad [place], MD;  Location: [place];  Service: Vascular;  Laterality: Left;  Left 2nd Stage Basilic Vein Transposition with gortex graft revision using 33mmx10cm graft  . [name] CAPSULE STUDY N/A 08/27/2013   Procedure: [name] CAPSULE STUDY;  Surgeon: Milus Banister, MD;  Location: [place];  Service: Endoscopy;  Laterality: N/A;  . INSERTION OF DIALYSIS CATHETER     right chest  . JOINT REPLACEMENT    . SHOULDER OPEN ROTATOR CUFF REPAIR Right   . TOTAL KNEE ARTHROPLASTY Left  Family History:   Family History  Problem Relation Age of Onset  . Diabetes Mother   . Hypertension Mother   . Stroke Mother   . Kidney failure Mother   . Cancer Father    Social History:  reports that he has been smoking Cigarettes.  He has a 67.50 pack-year smoking history. He has never used smokeless tobacco. He reports that he drinks about 14.4 oz of alcohol per week . He reports that he uses drugs, including "Crack" cocaine, Cocaine, and Marijuana. Allergies  Allergen Reactions  . Reglan [Metoclopramide] Other (See Comments)    Tardive dyskinesia in 11/2011 in [place]   Prior to Admission medications   Medication Sig Start Date End  Date Taking? Authorizing Provider  acetaminophen (TYLENOL) 325 MG tablet Take 650 mg by mouth every 4 (four) hours as needed.    Historical Provider, MD  aspirin EC 81 MG EC tablet Take 1 tablet (81 mg total) by mouth daily. 08/20/15   Ripudeep Krystal Eaton, MD  atorvastatin (LIPITOR) 40 MG tablet Take 1 tablet (40 mg total) by mouth at bedtime. 08/20/15   Ripudeep Krystal Eaton, MD  B Complex-C-Folic Acid (RENA-VITE PO) Take 1 tablet by mouth daily.    Historical Provider, MD  calcium acetate (PHOSLO) 667 MG capsule Take 667 mg by mouth 3 (three) times daily with meals.     Historical Provider, MD  docusate sodium (COLACE) 100 MG capsule Take 100 mg by mouth 2 (two) times daily.    Historical Provider, MD  glucagon (GLUCAGON EMERGENCY) 1 MG injection Inject 1 mg into the vein once as needed.    Historical Provider, MD  hydrALAZINE (APRESOLINE) 100 MG tablet Take 1 tablet (100 mg total) by mouth every 6 (six) hours. 01/01/16   Reyne Dumas, MD  labetalol (NORMODYNE) 100 MG tablet Take 1 tablet (100 mg total) by mouth 3 (three) times daily. 12/08/15   Orson Eva, MD  lacosamide 100 MG TABS Take 1 tablet (100 mg total) by mouth 2 (two) times daily. 01/01/16   Reyne Dumas, MD  losartan (COZAAR) 100 MG tablet Take 100 mg by mouth at bedtime.  10/02/14   Historical Provider, MD  mirtazapine (REMERON) 7.5 MG tablet Take 7.5 mg by mouth at bedtime.    Historical Provider, MD  ondansetron (ZOFRAN) 4 MG tablet Take 4 mg by mouth every 8 (eight) hours as needed for nausea or vomiting.    Historical Provider, MD  pantoprazole (PROTONIX) 40 MG tablet Take 1 tablet (40 mg total) by mouth daily. 01/01/16   Reyne Dumas, MD  polyethylene glycol (MIRALAX / GLYCOLAX) packet Take 17 g by mouth daily. 08/26/15   Bonnell Public, MD  saccharomyces boulardii (FLORASTOR) 250 MG capsule Take 250 mg by mouth daily.    Historical Provider, MD  senna-docusate (SENOKOT-S) 8.6-50 MG tablet Take 1 tablet by mouth 2 (two) times daily. For constipation  08/21/15   Ripudeep Krystal Eaton, MD  sevelamer carbonate (RENVELA) 2.4 g PACK Take 2.4 g by mouth 3 (three) times daily with meals. 01/01/16   Reyne Dumas, MD  verapamil (CALAN) 120 MG tablet Take 1 tablet (120 mg total) by mouth every 8 (eight) hours. Patient not taking: Reported on 12/24/2015 08/20/15   Ripudeep Krystal Eaton, MD   Current Facility-Administered Medications  Medication Dose Route Frequency Provider Last Rate Last Dose  . 0.9 %  sodium chloride infusion  10-20 mL/hr Intravenous Continuous Carmin Muskrat, MD 20 mL/hr at 03/17/2016 0822 20 mL/hr at 02/23/16  [id number]  . 0.9 %  sodium chloride infusion  250 mL Intravenous PRN Rush Farmer, MD      . 0.9 %  sodium chloride infusion   Intravenous Continuous Rush Farmer, MD      . acetaminophen (TYLENOL) tablet 650 mg  650 mg Oral Q4H PRN Rush Farmer, MD      . aspirin chewable tablet 324 mg  324 mg Oral NOW Rush Farmer, MD       Or  . aspirin suppository 300 mg  300 mg Rectal NOW Rush Farmer, MD      . calcium gluconate 1 g in sodium chloride 0.9 % 100 mL IVPB  1 g Intravenous Once Carmin Muskrat, MD      . fentaNYL (SUBLIMAZE) injection 50 mcg  50 mcg Intravenous Q1H PRN Carmin Muskrat, MD      . insulin aspart (novoLOG) injection 2-6 Units  2-6 Units Subcutaneous Q4H Rush Farmer, MD      . midazolam (VERSED) 5 MG/5ML injection   Intravenous PRN Carmin Muskrat, MD   4 mg at 02/19/2016 0824  . midazolam (VERSED) injection 4 mg  4 mg Intravenous Q1H PRN Carmin Muskrat, MD      . norepinephrine (LEVOPHED) 4 mg in dextrose 5 % 250 mL (0.016 mg/mL) infusion  5-25 mcg/min Intravenous Titrated Rush Farmer, MD       Current Outpatient Prescriptions  Medication Sig Dispense Refill  . acetaminophen (TYLENOL) 325 MG tablet Take 650 mg by mouth every 4 (four) hours as needed.    Marland Kitchen aspirin EC 81 MG EC tablet Take 1 tablet (81 mg total) by mouth daily. 30 tablet 3  . atorvastatin (LIPITOR) 40 MG tablet Take 1 tablet (40 mg total) by mouth at  bedtime. 30 tablet 3  . B Complex-C-Folic Acid (RENA-VITE PO) Take 1 tablet by mouth daily.    . calcium acetate (PHOSLO) 667 MG capsule Take 667 mg by mouth 3 (three) times daily with meals.     . docusate sodium (COLACE) 100 MG capsule Take 100 mg by mouth 2 (two) times daily.    Marland Kitchen glucagon (GLUCAGON EMERGENCY) 1 MG injection Inject 1 mg into the vein once as needed.    . hydrALAZINE (APRESOLINE) 100 MG tablet Take 1 tablet (100 mg total) by mouth every 6 (six) hours. 90 tablet 0  . labetalol (NORMODYNE) 100 MG tablet Take 1 tablet (100 mg total) by mouth 3 (three) times daily. 90 tablet 0  . lacosamide 100 MG TABS Take 1 tablet (100 mg total) by mouth 2 (two) times daily. 60 tablet 2  . losartan (COZAAR) 100 MG tablet Take 100 mg by mouth at bedtime.     . mirtazapine (REMERON) 7.5 MG tablet Take 7.5 mg by mouth at bedtime.    . ondansetron (ZOFRAN) 4 MG tablet Take 4 mg by mouth every 8 (eight) hours as needed for nausea or vomiting.    . pantoprazole (PROTONIX) 40 MG tablet Take 1 tablet (40 mg total) by mouth daily. 30 tablet 1  . polyethylene glycol (MIRALAX / GLYCOLAX) packet Take 17 g by mouth daily. 14 each 0  . saccharomyces boulardii (FLORASTOR) 250 MG capsule Take 250 mg by mouth daily.    Marland Kitchen senna-docusate (SENOKOT-S) 8.6-50 MG tablet Take 1 tablet by mouth 2 (two) times daily. For constipation 60 tablet 3  . sevelamer carbonate (RENVELA) 2.4 g PACK Take 2.4 g by mouth 3 (three) times daily with  meals. 90 each 1  . verapamil (CALAN) 120 MG tablet Take 1 tablet (120 mg total) by mouth every 8 (eight) hours. (Patient not taking: Reported on 12/24/2015) 60 tablet 3   Labs: Basic Metabolic Panel:  Recent Labs Lab 03/08/2016 0753 03/08/2016 0803  NA 133* 130*  K >7.5* 8.2*  CL 95* 103  CO2 19*  --   GLUCOSE 252* 237*  BUN 91* 95*  CREATININE 8.98* 8.80*  CALCIUM 8.9  --    Liver Function Tests:  Recent Labs Lab 03/13/2016 0753  AST 218*  ALT 111*  ALKPHOS 85  BILITOT 0.6   PROT 5.9*  ALBUMIN 2.6*   No results for input(s): LIPASE, AMYLASE in the last 168 hours. No results for input(s): AMMONIA in the last 168 hours. CBC:  Recent Labs Lab 02/21/2016 0753 03/18/2016 0803  WBC 12.4*  --   NEUTROABS 8.6*  --   HGB 7.5* 7.8*  HCT 24.0* 23.0*  MCV 90.2  --   PLT 255  --    Cardiac Enzymes:  Recent Labs Lab 03/17/2016 0753  TROPONINI 0.03*   CBG: No results for input(s): GLUCAP in the last 168 hours. Iron Studies: No results for input(s): IRON, TIBC, TRANSFERRIN, FERRITIN in the last 72 hours. Studies/Results: Dg Chest Portable 1 View  Result Date: 02/19/2016 CLINICAL DATA:  Hypoxia EXAM: PORTABLE CHEST 1 VIEW COMPARISON:  February 23, 2016 FINDINGS: Endotracheal tube tip is 5.5 cm above the carina. No pneumothorax. There is cardiomegaly with pulmonary venous hypertension. There is no frank edema or consolidation. Metallic pellets are noted overlying the right glenoid region. IMPRESSION: Endotracheal tube as described without pneumothorax. Pulmonary vascular congestion without frank edema or consolidation. Electronically Signed   By: Lowella Grip III M.D.   On: 03/04/2016 08:54   Dg Chest Port 1 View  Result Date: 03/13/2016 CLINICAL DATA:  Hypoxia.  Status post cardiac arrest EXAM: PORTABLE CHEST 1 VIEW COMPARISON:  December 28, 2015 FINDINGS: Endotracheal tube tip is 5.2 cm above the carina. No pneumothorax evident. There is mild interstitial edema, primarily in the upper lobes. There is no airspace consolidation or volume loss. The heart is enlarged with pulmonary venous hypertension. No fracture evident. IMPRESSION: Endotracheal tube as described without pneumothorax. Evidence of a degree of congestive heart failure. No airspace consolidation. Electronically Signed   By: Lowella Grip III M.D.   On: 03/13/2016 08:38    ROS: Review of systems not obtained due to patient factors. Physical Exam: Vitals:   02/28/2016 0840 02/28/2016 0843 03/06/2016  0845 02/27/2016 0850  BP: (!) 94/20  96/67 97/67  Pulse: 72  70 69  Resp: 25  23 21   Temp:  (!) 90.7 F (32.6 C)    TempSrc:  Rectal    SpO2: 98%  100% 100%  Height:          Weight change:  No intake or output data in the 24 hours ending 03/17/2016 0903 BP 97/67   Pulse 69   Temp (!) 90.7 F (32.6 C) (Rectal) Comment: [place], RN present when taken  Resp 21   Ht 5\' 11"  (1.803 m)   SpO2 100%  General appearance: Intubated and sedated Resp: rales bibasilar and rhonchi bilaterally Cardio: no rub GI: soft, non-tender; bowel sounds normal; no masses,  no organomegaly Extremities: s/p right BKA, no edema, LUE AVG +T/B Dialysis Access: LAVF  Dialysis Orders:[facility] TTS  4h 2K/2.25 Ca 400/auto 1.5 UF Profile2 EDW 56kg LAVF  No Heparin  Hectorol 7 mcg  IV q treatment  Mircera 135mcg IV q 2 weeks (last dose 150 mcg IV8/26/17)  OP Labs: Hgb 11.2 Tsat 54% K 4.7 Corr Ca 10.3 iPTH 73  Assessment/Plan: 1.  PEA arrest/Cardiogenic shock- most likely due to hyperkalemia from noncompliance with HD (skipped HD on Saturday and signed off an hour early on Thursday).  Now with external pacer.  Transfer to ICU and management per PCCM.  2.  ESRD -  BP too low for conventional HD so will plan for CRRT and appreciate PCCM assistance with trialysis catheter placement 3.  Hypertension/volume  - on pressors due to cardiogenic shock/hypotension 4.  Anemia  - significant drop in Hgb and has h/o GI bleed and nonadherence with workup.  Follow and transfuse prn.  Cont with ESA.  No heparin with CRRT due to h/o heme + stools and drop in hgb.  5. VDRF - per PCCM 6. AMS- concerning for anoxic brain injury.  Will need to follow neuro exam and may need imaging. 7.  Metabolic bone disease -  Binders on hold for now 8.  Nutrition - per PCCM [place], MD [place] Pager [phone] 03/08/2016, 9:03 AM

## 2016-02-23 NOTE — Procedures (Signed)
History: 60 year old male with abnormal movements after cardiac arrest  Sedation: Propofol  Technique: This is a 21 channel routine scalp EEG performed at the bedside with bipolar and monopolar montages arranged in accordance to the international 10/20 system of electrode placement. One channel was dedicated to EKG recording.    Background: The background consists of generalized attenuation with the exception of bursts of theta and alpha activity lasting 1-2 seconds associated with generalized myoclonus. This activity was not sustained did not have evidence of evolution even within the bursts.  Photic stimulation: Physiologic driving is not performed  EEG Abnormalities: 1) burst suppression EEG  Clinical Interpretation: This EEG is consistent with a generalized non-specific cerebral dysfunction(encephalopathy). Though burst suppression pattern can be seen with medication effect, the association of all the bursts with generalized myoclonus is highly correlated with poor prognosis in this clinical setting.   Roland Rack, MD [facility] [phone]  If 7pm- 7am, please page neurology on call as listed in [place].

## 2016-02-23 NOTE — ED Notes (Signed)
Placed patient's pants in a patient belongings bag and labeled; placed on stretcher with patient

## 2016-02-23 NOTE — ED Notes (Signed)
Nephrology at bedside

## 2016-02-23 NOTE — ED Notes (Signed)
Placed 3 warm blankets on patient

## 2016-02-23 NOTE — Procedures (Signed)
HPI:  60 y/o with CP arrest  TECHNICAL SUMMARY:  A multichannel referential and bipolar montage EEG using the standard international 10-20 system was performed on the patient.  The large majority of this recording is unreadable due to artifact from external pacer activity which completely obliterates any electrocortical activity that may be present.  During the very brief portion of the tracing in which there was no pacer artifact (perhaps only 2 seconds) there was diffuse, severe suppression of electrocortical activity.  ACTIVATION:  Stepwise photic stimulation and hyperventilation are not performed  EPILEPTIFORM ACTIVITY:  Adequate comment cannot be made on epileptiform activity.  As above, this tracing really is uninterpretable due to artifact from external pacer activity  IMPRESSION: This EEG is largely uninterpretable due to artifact from external pacer activity.  During the very brief portion of the tracing in which there was no pacer artifact (just a few seconds) there was diffuse, severe suppression of electrocortical activity.

## 2016-02-23 NOTE — Code Documentation (Signed)
EDP and RT as well as 2 RNs at bedside preparing to replace [facility] airway.

## 2016-02-23 NOTE — ED Triage Notes (Signed)
Pt reports to the ED via [facility] following a cardiac arrest. Pt was found unresponsive by nursing home staff. Upon fire arrival they noted the patient was pulseless and asystolic. They initiated CPR. Pt received approx 20 mins of CPR, 3 epis, Ca+ carbonate, and sodium bicarb. He had ROSC and has been bradycardic in the 40s. Pt being paced currently. Pt is a dialysis patient and he refused his Saturday dialysis. Pt is intubated and being bagged on arrival. Coarse bilateral breath sounds noted. CBG 300s.

## 2016-02-23 NOTE — ED Notes (Signed)
Critical care at bedside  

## 2016-02-23 NOTE — Progress Notes (Signed)
[facility] Progress Note Patient Name: [name] DOB: [birth year] MRN: [id number]   Date of Service  02/18/2016  HPI/Events of Note  Notified of need for SUP. History of prolonged QTc interval, therefore, can't use Pepcid or Protonix.   eICU Interventions  Will order: 1. Carafate 1 gm PO Q 6 hours.      Intervention Category Intermediate Interventions: Best-practice therapies (e.g. DVT, beta blocker, etc.)  Jacorie Ernsberger [place] 03/05/2016, 3:53 PM

## 2016-02-23 NOTE — H&P (Signed)
PULMONARY / CRITICAL CARE MEDICINE   Name: [name] MRN: [id number] DOB: 1956-03-26    ADMISSION DATE:  03/03/2016 CONSULTATION DATE:  37 [name]  REFERRING MD:  Vanita Panda  CHIEF COMPLAINT: PEA arrest   HISTORY OF PRESENT ILLNESS:   This is a 60 yo aam. He resides at SNF. Hx received by EDP. Apparently refused scheduled HD on 11/4. Found unresponsive by SNF staff. EMS called and he was found pulseless and asystolic on arrival. He underwent 20 minutes of ACLS before ROSC obtained. He arrived on external pacer, remained hypotensive so started on Levophed gtt. K was 8.2. On PCCM arrival he was unresponsive on vent w/ agonal resp efforts over the vent. He had been treated for Hyperkalemia and nephrology notified   PAST MEDICAL HISTORY :  He  has a past medical history of Anemia, chronic disease; Arthritis; Chronic diastolic CHF (congestive heart failure) ([place]); Cocaine abuse; ESRD (end stage renal disease) on dialysis [facility]); ESRD on hemodialysis ([place]) (since 2012); GI bleed (05/04/2012); Head injury, closed, with concussion; Headache(784.0); Helicobacter pylori gastritis; Hematochezia; Hepatitis B core antibody positive; Hepatitis C antibody test positive; History of blood transfusion; Hypercalcemia; Hyperpotassemia; Hypertension; Hypertensive heart disease; Optic neuritis, left; Polyp of colon, adenomatous; Positive QuantiFERON-TB Gold test; Protein-calorie malnutrition, severe ([place]); Seizure disorder ([place]); and Tobacco abuse.  PAST SURGICAL HISTORY: He  has a past surgical history that includes Total knee arthroplasty (Left); Bascilic vein transposition (03/07/2012); Insertion of dialysis catheter; Bascilic vein transposition (Left, 05/31/2012); Shoulder open rotator cuff repair (Right); [name] capsule study (N/A, 08/27/2013); and Joint replacement.  Allergies  Allergen Reactions  . Reglan [Metoclopramide] Other (See Comments)    Tardive dyskinesia in 11/2011 in [place]    No current  facility-administered medications on file prior to encounter.    Current Outpatient Prescriptions on File Prior to Encounter  Medication Sig  . acetaminophen (TYLENOL) 325 MG tablet Take 650 mg by mouth every 4 (four) hours as needed.  Marland Kitchen aspirin EC 81 MG EC tablet Take 1 tablet (81 mg total) by mouth daily.  Marland Kitchen atorvastatin (LIPITOR) 40 MG tablet Take 1 tablet (40 mg total) by mouth at bedtime.  . B Complex-C-Folic Acid (RENA-VITE PO) Take 1 tablet by mouth daily.  . calcium acetate (PHOSLO) 667 MG capsule Take 667 mg by mouth 3 (three) times daily with meals.   . docusate sodium (COLACE) 100 MG capsule Take 100 mg by mouth 2 (two) times daily.  Marland Kitchen glucagon (GLUCAGON EMERGENCY) 1 MG injection Inject 1 mg into the vein once as needed.  . hydrALAZINE (APRESOLINE) 100 MG tablet Take 1 tablet (100 mg total) by mouth every 6 (six) hours.  Marland Kitchen labetalol (NORMODYNE) 100 MG tablet Take 1 tablet (100 mg total) by mouth 3 (three) times daily.  Marland Kitchen lacosamide 100 MG TABS Take 1 tablet (100 mg total) by mouth 2 (two) times daily.  Marland Kitchen losartan (COZAAR) 100 MG tablet Take 100 mg by mouth at bedtime.   . mirtazapine (REMERON) 7.5 MG tablet Take 7.5 mg by mouth at bedtime.  . ondansetron (ZOFRAN) 4 MG tablet Take 4 mg by mouth every 8 (eight) hours as needed for nausea or vomiting.  . pantoprazole (PROTONIX) 40 MG tablet Take 1 tablet (40 mg total) by mouth daily.  . polyethylene glycol (MIRALAX / GLYCOLAX) packet Take 17 g by mouth daily.  Marland Kitchen saccharomyces boulardii (FLORASTOR) 250 MG capsule Take 250 mg by mouth daily.  Marland Kitchen senna-docusate (SENOKOT-S) 8.6-50 MG tablet Take 1 tablet by mouth 2 (two)  times daily. For constipation  . sevelamer carbonate (RENVELA) 2.4 g PACK Take 2.4 g by mouth 3 (three) times daily with meals.  . verapamil (CALAN) 120 MG tablet Take 1 tablet (120 mg total) by mouth every 8 (eight) hours. (Patient not taking: Reported on 12/24/2015)    FAMILY HISTORY:  His indicated that his mother is  deceased. He indicated that his father is deceased.    SOCIAL HISTORY: He  reports that he has been smoking Cigarettes.  He has a 67.50 pack-year smoking history. He has never used smokeless tobacco. He reports that he drinks about 14.4 oz of alcohol per week . He reports that he uses drugs, including "Crack" cocaine, Cocaine, and Marijuana.  REVIEW OF SYSTEMS:   Unable   SUBJECTIVE:  Unresponsive   VITAL SIGNS: BP 92/63   Pulse 71   Temp (!) 90.7 F (32.6 C) (Rectal)   Resp 21   SpO2 96%   HEMODYNAMICS:    VENTILATOR SETTINGS:    INTAKE / OUTPUT: No intake/output data recorded.  PHYSICAL EXam general:  Chronically ill appearing aam, currently on external pacer. Agonal resp pattern  Neuro: unresponsive GCS 3 HEENT:  Orally intubated. Edentulous  Cardiovascular:  Distant RRR, PEA as underlying HR, currently paced + LUE good thrill and bruit on graft  Lungs:  Diffuse rhonchi, tactile frem  Abdomen:  Soft not tender hypoactive BS Musculoskeletal:  Prior BKA, otherwise warm and dry  Skin:  Dry and intact  LABS:  BMET  Recent Labs Lab 03/07/2016 0803  NA 130*  K 8.2*  CL 103  BUN 95*  CREATININE 8.80*  GLUCOSE 237*    Electrolytes No results for input(s): CALCIUM, MG, PHOS in the last 168 hours.  CBC  Recent Labs Lab 02/27/2016 0753 02/22/2016 0803  WBC 12.4*  --   HGB 7.5* 7.8*  HCT 24.0* 23.0*  PLT 255  --     Coag's  Recent Labs Lab 03/16/2016 0753  APTT 33  INR 1.12    Sepsis Markers No results for input(s): LATICACIDVEN, PROCALCITON, O2SATVEN in the last 168 hours.  ABG No results for input(s): PHART, PCO2ART, PO2ART in the last 168 hours.  Liver Enzymes No results for input(s): AST, ALT, ALKPHOS, BILITOT, ALBUMIN in the last 168 hours.  Cardiac Enzymes No results for input(s): TROPONINI, PROBNP in the last 168 hours.  Glucose No results for input(s): GLUCAP in the last 168 hours.  Imaging Dg Chest Port 1 View  Result Date:  03/04/2016 CLINICAL DATA:  Hypoxia.  Status post cardiac arrest EXAM: PORTABLE CHEST 1 VIEW COMPARISON:  December 28, 2015 FINDINGS: Endotracheal tube tip is 5.2 cm above the carina. No pneumothorax evident. There is mild interstitial edema, primarily in the upper lobes. There is no airspace consolidation or volume loss. The heart is enlarged with pulmonary venous hypertension. No fracture evident. IMPRESSION: Endotracheal tube as described without pneumothorax. Evidence of a degree of congestive heart failure. No airspace consolidation. Electronically Signed   By: Lowella Grip III M.D.   On: 03/03/2016 08:38     STUDIES:  CT head 11/6>>> EEG 11/6>>>  CULTURES: BC 11/6>>>  ANTIBIOTICS:   SIGNIFICANT EVENTS: 11/6 PEA arrest at SNF (missed HD 2 days prior). 20 minutes to ROSC.   LINES/TUBES: OETT 11/6>>>   DISCUSSION: 60yom w esrd. Presents s/p PEA arrest. K 8.2. Time to ROSC 20 minutes but does not account for down time prior. Will admit to ICU, nepho notified. Will place temp cath and emergently  dialyze. Concerned that he has sustained a devastating neurological insult. Will get CT and EEG.   ASSESSMENT / PLAN:  PULMONARY A: Ventilator dependence d/t cardiopulmonary arrest  Pulmonary edema QF gold positive  P:   Full vent support PAD protocol Assess for weaning daily although suspect neurologic exam will be major barrier  CARDIOVASCULAR A:  PEA arrest Cardiogenic shock  H/o diastolic HF  P:  Levophed gtt MAP >65 Correct acidosis and hyperkalemia  Admit to ICU   RENAL A:   ESRD -->skipped HD 11/4 Hyperkalemia (8.8)  P:   Emergent HD-->nepho notified    GASTROINTESTINAL A:   R/o GIB Hep C P:   IV H2B NPO  HEMATOLOGIC A:   Anemia of chronic disease   ->has had a significant hgb drop over last 4 weeks has dropped 2 gms P:  hemaoccult stools Transfuse for hgb < 7 SCDs for now  INFECTIOUS A:   No evidence of infection  P:   Trend Fever curve  & WBC Pan culture; hold off an abx  ENDOCRINE A:   Hyperglycemia  P:   ssi   NEUROLOGIC A:   Acute encephalopathy -->likely anoxic  P:   RASS goal: 0 to -1 PAD protocol EEG CT head (non-contrast)   FAMILY  - Updates:   - Inter-disciplinary family meet or Palliative Care meeting due by:  11/15  My ccm time 89 minutes  Erick Colace ACNP-BC [place] Pager # [phone] OR # [phone] if no answer  Attending Note:  60 year old male with ESRD and Hep C who skipped dialysis on Saturday for not feeling well, was found unresponsive in PEA arrest on 11/6.  Patient was paced and was intubated and brought to the ED.  In the ED, K was 8.2 and patient was externally paced.  On exam, pupils are fixed and pin-point, unresponsive and in refractory shock.  Given refractory shock and low Hg (drop of 2 gms in one month) decision was made to not institute hypothermia protocol.  Renal called, will place a femoral line for CRRT.  Patient has a legal guardian and had been DNR in the past.  Social work to assist with getting in contact with patient's decision maker to discuss plan of care.  In the meantime, no heparin until head CT is negative and no evidence of GI bleeding.  Pan culture but no abx since no evidence of infection.  Will hold off on calling cards as this appears to be an arrest driven by hyperkalemia.  EEG and head CT ordered and admit to CCU.  The patient is critically ill with multiple organ systems failure and requires high complexity decision making for assessment and support, frequent evaluation and titration of therapies, application of advanced monitoring technologies and extensive interpretation of multiple databases.   Critical Care Time devoted to patient care services described in this note is  35  Minutes. This time reflects time of care of this signee Dr Jennet Maduro. This critical care time does not reflect procedure time, or teaching time or  supervisory time of PA/NP/Med student/Med Resident etc but could involve care discussion time.  Rush Farmer, M.D. [facility] Pulmonary/Critical Care Medicine. Pager: [phone]. After hours pager: [phone].  03/17/2016, 8:41 AM

## 2016-02-23 NOTE — Progress Notes (Signed)
Patient transported to 2H07 from ED on ventilator. Vitals stable. No complications noted.

## 2016-02-23 NOTE — Code Documentation (Signed)
Warm blankets applied with permission from [facility]. Per EDP we are not doing the cooling protocol with this patient.

## 2016-02-23 NOTE — ED Provider Notes (Signed)
[facility] DEPT Provider Note   CSN: [id number] Arrival date & time: 03/17/2016  [id number]     History   Chief Complaint Chief Complaint  Patient presents with  . Cardiac Arrest    HPI [name] is a 60 y.o. male.  HPI  Patient presents via EMS in extremis. Per report the patient is a resident of a nursing facility. Apparently the patient had nausea and vomiting today, subsequently was found unresponsive. After approximately 20 minutes of CPR the patient had return of spontaneous circulation, but was bradycardic, no sustained pulses. EMS started transcutaneous pacing. CPR provided per protocol, with provision of multiple medication, including epinephrine, bicarbonate.  EMS providers also notes patient did not attend dialysis today as ago, last had dialysis 4 days ago.  Level V caveat secondary to the patient's condition.   Past Medical History:  Diagnosis Date  . Anemia, chronic disease    a. Capsule endoscopy reportedly negative 08/2013. b. seen by heme with concern for minor B cell population on BMB, being observed.  . Arthritis    "right shoulder" (11/09/2012)  . Chronic diastolic CHF (congestive heart failure) ([place])    a. EF 60 - 65% per [place] echo 11/2011. b. Echo 02/2012: severe LVH, focal basal hypertrophy, EF 60-65%, mild Mr.  . Cocaine abuse    mentioned in notes from [facility]  . Diabetes mellitus without complication ([place])   . ESRD (end stage renal disease) on dialysis [facility])    "TTS; [address]" (06/19/2015)  . ESRD on hemodialysis ([place]) since 2012   a. Since 2012. ESRD was due to "drugs", primarily used cocaine.  Has 3-5 year hx of HTN, no DM.  Gets HD on TTS schedule at [place]. Originally from [place].   . GI bleed 05/04/2012  . Head injury, closed, with concussion   . Headache(784.0)   . Helicobacter pylori gastritis    not defined if this was treated  . Hematochezia   . Hepatitis B core antibody positive    03/01/10  . Hepatitis C antibody  test positive    was HIV negative, 02/28/12  . History of blood transfusion   . Hypercalcemia   . Hyperpotassemia   . Hypertension   . Hypertensive heart disease   . Optic neuritis, left   . Polyp of colon, adenomatous    May 2012.  Dr Trenton Founds in [place]  . Positive QuantiFERON-TB Gold test    11/2011  . Protein-calorie malnutrition, severe ([place])   . Seizure disorder [facility])    questionable history of - will need to clarify with PCP  . Tobacco abuse     Patient Active Problem List   Diagnosis Date Noted  . Abnormal urinalysis 12/23/2015  . Mental status change   . Polysubstance abuse 09/17/2015  . HLD (hyperlipidemia) 09/17/2015  . Physical deconditioning   . Alcoholism /alcohol abuse ([place])   . NSTEMI (non-ST elevated myocardial infarction) ([place]) 08/16/2015  . Hepatitis C antibody test positive 06/23/2015  . History of hepatitis B virus infection 06/20/2015  . Acute encephalopathy 06/20/2015  . Alcohol withdrawal ([place]) 06/19/2015  . Acute GI bleeding 04/12/2015  . LVH (left ventricular hypertrophy) due to hypertensive disease 04/12/2015  . Prolonged Q-T interval on ECG 04/12/2015  . Anemia of chronic kidney failure 04/26/2014  . Hyperparathyroidism, secondary renal ([place]) 04/26/2014  . Protein-calorie malnutrition, severe ([place]) 01/25/2014  . Optic neuritis, left 01/19/2014  . Cocaine abuse 03/02/2012  . Uncontrolled hypertension   . ESRD on hemodialysis ([place])   .  Seizure disorder [facility])     Past Surgical History:  Procedure Laterality Date  . [place] TRANSPOSITION  03/07/2012   Procedure: BASCILIC VEIN TRANSPOSITION;  Surgeon: Conrad [place], MD;  Location: [facility];  Service: Vascular;  Laterality: Left;  First Stage  . BASCILIC VEIN TRANSPOSITION Left 05/31/2012   Procedure: BASCILIC VEIN TRANSPOSITION;  Surgeon: Conrad Glas[place], MD;  Location: [facility];  Service: Vascular;  Laterality: Left;  Left 2nd Stage Basilic Vein Transposition with gortex graft revision using  35mmx10cm graft  . [name] CAPSULE STUDY N/A 08/27/2013   Procedure: [name] CAPSULE STUDY;  Surgeon: Milus Banister, MD;  Location: [facility];  Service: Endoscopy;  Laterality: N/A;  . INSERTION OF DIALYSIS CATHETER     right chest  . JOINT REPLACEMENT    . SHOULDER OPEN ROTATOR CUFF REPAIR Right   . TOTAL KNEE ARTHROPLASTY Left        Home Medications    Prior to Admission medications   Medication Sig Start Date End Date Taking? Authorizing Provider  acetaminophen (TYLENOL) 325 MG tablet Take 650 mg by mouth every 4 (four) hours as needed.    Historical Provider, MD  aspirin EC 81 MG EC tablet Take 1 tablet (81 mg total) by mouth daily. 08/20/15   Ripudeep Krystal Eaton, MD  atorvastatin (LIPITOR) 40 MG tablet Take 1 tablet (40 mg total) by mouth at bedtime. 08/20/15   Ripudeep Krystal Eaton, MD  B Complex-C-Folic Acid (RENA-VITE PO) Take 1 tablet by mouth daily.    Historical Provider, MD  calcium acetate (PHOSLO) 667 MG capsule Take 667 mg by mouth 3 (three) times daily with meals.     Historical Provider, MD  docusate sodium (COLACE) 100 MG capsule Take 100 mg by mouth 2 (two) times daily.    Historical Provider, MD  glucagon (GLUCAGON EMERGENCY) 1 MG injection Inject 1 mg into the vein once as needed.    Historical Provider, MD  hydrALAZINE (APRESOLINE) 100 MG tablet Take 1 tablet (100 mg total) by mouth every 6 (six) hours. 01/01/16   Reyne Dumas, MD  labetalol (NORMODYNE) 100 MG tablet Take 1 tablet (100 mg total) by mouth 3 (three) times daily. 12/08/15   Orson Eva, MD  lacosamide 100 MG TABS Take 1 tablet (100 mg total) by mouth 2 (two) times daily. 01/01/16   Reyne Dumas, MD  losartan (COZAAR) 100 MG tablet Take 100 mg by mouth at bedtime.  10/02/14   Historical Provider, MD  mirtazapine (REMERON) 7.5 MG tablet Take 7.5 mg by mouth at bedtime.    Historical Provider, MD  ondansetron (ZOFRAN) 4 MG tablet Take 4 mg by mouth every 8 (eight) hours as needed for nausea or vomiting.    Historical  Provider, MD  pantoprazole (PROTONIX) 40 MG tablet Take 1 tablet (40 mg total) by mouth daily. 01/01/16   Reyne Dumas, MD  polyethylene glycol (MIRALAX / GLYCOLAX) packet Take 17 g by mouth daily. 08/26/15   Bonnell Public, MD  saccharomyces boulardii (FLORASTOR) 250 MG capsule Take 250 mg by mouth daily.    Historical Provider, MD  senna-docusate (SENOKOT-S) 8.6-50 MG tablet Take 1 tablet by mouth 2 (two) times daily. For constipation 08/21/15   Ripudeep Krystal Eaton, MD  sevelamer carbonate (RENVELA) 2.4 g PACK Take 2.4 g by mouth 3 (three) times daily with meals. 01/01/16   Reyne Dumas, MD  verapamil (CALAN) 120 MG tablet Take 1 tablet (120 mg total) by mouth every 8 (eight) hours. Patient  not taking: Reported on 12/24/2015 08/20/15   Ripudeep Krystal Eaton, MD    Family History Family History  Problem Relation Age of Onset  . Diabetes Mother   . Hypertension Mother   . Stroke Mother   . Kidney failure Mother   . Cancer Father     Social History Social History  Substance Use Topics  . Smoking status: Current Every Day Smoker    Packs/day: 1.50    Years: 45.00    Types: Cigarettes  . Smokeless tobacco: Never Used  . Alcohol use 14.4 oz/week    24 Cans of beer per week     Comment: 1 40's per day     Allergies   Reglan [metoclopramide]   Review of Systems Review of Systems  Unable to perform ROS: Acuity of condition     Physical Exam Updated Vital Signs BP 116/70   Pulse 70   Temp (!) 90.7 F (32.6 C) (Rectal) Comment: [place], RN present when taken  Resp 20   Ht 5\' 11"  (1.803 m)   Wt 104 lb (47.2 kg) Comment: estimated  SpO2 100%   BMI 14.51 kg/m   Physical Exam  Constitutional:  Elderly appearing male unresponsive, in extremis presenting with active transcutaneous pacing  HENT:  Head: Normocephalic and atraumatic.  Edentulous  Eyes:  Pinpoint no blink response  Neck: Neck supple.  Cardiovascular:  Native rhythm when pacemaker stopped is bradycardic, 20/30,  wide-complex negligible pulses  Pulmonary/Chest:  Course breath sounds bilaterally  Abdominal: He exhibits no distension.  Musculoskeletal:  Left lower leg below-the-knee amputation  Neurological:  Unresponsive, flaccid  Psychiatric: Cognition and memory are impaired.  Nursing note and vitals reviewed.    ED Treatments / Results  Labs (all labs ordered are listed, but only abnormal results are displayed) Labs Reviewed  CBC - Abnormal; Notable for the following:       Result Value   WBC 12.4 (*)    RBC 2.66 (*)    Hemoglobin 7.5 (*)    HCT 24.0 (*)    RDW 18.3 (*)    All other components within normal limits  DIFFERENTIAL - Abnormal; Notable for the following:    Neutro Abs 8.6 (*)    All other components within normal limits  COMPREHENSIVE METABOLIC PANEL - Abnormal; Notable for the following:    Sodium 133 (*)    Potassium >7.5 (*)    Chloride 95 (*)    CO2 19 (*)    Glucose, Bld 252 (*)    BUN 91 (*)    Creatinine, Ser 8.98 (*)    Total Protein 5.9 (*)    Albumin 2.6 (*)    AST 218 (*)    ALT 111 (*)    GFR calc non Af Amer 6 (*)    GFR calc Af Amer 7 (*)    Anion gap 19 (*)    All other components within normal limits  TROPONIN I - Abnormal; Notable for the following:    Troponin I 0.03 (*)    All other components within normal limits  BRAIN NATRIURETIC PEPTIDE - Abnormal; Notable for the following:    B Natriuretic Peptide 497.0 (*)    All other components within normal limits  MAGNESIUM - Abnormal; Notable for the following:    Magnesium 2.9 (*)    All other components within normal limits  CBC - Abnormal; Notable for the following:    RBC 2.44 (*)    Hemoglobin 6.8 (*)  HCT 21.4 (*)    RDW 17.7 (*)    All other components within normal limits  I-STAT CHEM 8, ED - Abnormal; Notable for the following:    Sodium 130 (*)    Potassium 8.2 (*)    BUN 95 (*)    Creatinine, Ser 8.80 (*)    Glucose, Bld 237 (*)    Calcium, Ion 1.03 (*)    Hemoglobin  7.8 (*)    HCT 23.0 (*)    All other components within normal limits  I-STAT ARTERIAL BLOOD GAS, ED - Abnormal; Notable for the following:    pO2, Arterial 125.0 (*)    Acid-base deficit 3.0 (*)    All other components within normal limits  CULTURE, BLOOD (ROUTINE X 2)  CULTURE, BLOOD (ROUTINE X 2)  CULTURE, RESPIRATORY (NON-EXPECTORATED)  PROTIME-INR  APTT  LIPID PANEL  COMPREHENSIVE METABOLIC PANEL  MAGNESIUM  PHOSPHORUS  PROCALCITONIN  BRAIN NATRIURETIC PEPTIDE  CORTISOL  COOXEMETRY PANEL  PROTIME-INR  APTT  D-DIMER, QUANTITATIVE (NOT AT [facility])  RENAL FUNCTION PANEL  I-STAT TROPOININ, ED    EKG  EKG Interpretation  Date/Time:  Monday February 23 2016 07:54:19 EST Ventricular Rate:  28 PR Interval:    QRS Duration: 128 QT Interval:  467 QTC Calculation: 319 R Axis:   -146 Text Interpretation:  wide complex bradycardia - junctional or arial - unclear Abnormal ekg Confirmed by Carmin Muskrat  MD ([id number]) on 03/18/2016 9:44:32 AM       Radiology Dg Chest Portable 1 View  Result Date: 03/06/2016 CLINICAL DATA:  Hypoxia EXAM: PORTABLE CHEST 1 VIEW COMPARISON:  February 23, 2016 FINDINGS: Endotracheal tube tip is 5.5 cm above the carina. No pneumothorax. There is cardiomegaly with pulmonary venous hypertension. There is no frank edema or consolidation. Metallic pellets are noted overlying the right glenoid region. IMPRESSION: Endotracheal tube as described without pneumothorax. Pulmonary vascular congestion without frank edema or consolidation. Electronically Signed   By: Lowella Grip III M.D.   On: 03/12/2016 08:54   Dg Chest Port 1 View  Result Date: 03/17/2016 CLINICAL DATA:  Hypoxia.  Status post cardiac arrest EXAM: PORTABLE CHEST 1 VIEW COMPARISON:  December 28, 2015 FINDINGS: Endotracheal tube tip is 5.2 cm above the carina. No pneumothorax evident. There is mild interstitial edema, primarily in the upper lobes. There is no airspace consolidation or volume loss.  The heart is enlarged with pulmonary venous hypertension. No fracture evident. IMPRESSION: Endotracheal tube as described without pneumothorax. Evidence of a degree of congestive heart failure. No airspace consolidation. Electronically Signed   By: Lowella Grip III M.D.   On: 02/19/2016 08:38    Procedures External pacer Date/Time: 03/18/2016 7:15 AM Performed by: Carmin Muskrat Authorized by: Carmin Muskrat  Consent: The procedure was performed in an emergent situation. Verbal consent not obtained. Written consent not obtained. Consent given by: doctor - implied consent. Patient states understanding of procedure being performed: unresponsive. Patient's understanding of procedure matches consent: unresponsive. Procedure consent matches procedure scheduled: unresponsive. Relevant documents present and verified: unresponsive. Test results available and properly labeled: unresponsive. Site marked: the operative site was marked Imaging studies: imaging studies available Required items: required blood products, implants, devices, and special equipment available Patient identity confirmed: arm band, provided demographic data and hospital-assigned identification number Time out: Immediately prior to procedure a "time out" was called to verify the correct patient, procedure, equipment, support staff and site/side marked as required. Preparation: Patient was prepped and draped in the usual sterile fashion. Local  anesthesia used: no  Anesthesia: Local anesthesia used: no  Sedation: Patient sedated: yes, see MAR, versed PRN. Patient tolerance: Patient tolerated the procedure well with no immediate complications Comments: Transcutaneous pacing performed at 131mA 60 bpm    (including critical care time)  Medications Ordered in ED Medications  0.9 %  sodium chloride infusion (10 mL/hr Intravenous Transfusing/Transfer 03/14/2016 0926)  fentaNYL (SUBLIMAZE) injection 50 mcg (not  administered)  midazolam (VERSED) injection 4 mg (0 mg Intravenous Hold 03/05/2016 0921)  calcium gluconate 1 g in sodium chloride 0.9 % 100 mL IVPB (0 g Intravenous Hold 02/18/2016 0921)  midazolam (VERSED) 5 MG/5ML injection (4 mg Intravenous Given 03/11/2016 0824)  0.9 %  sodium chloride infusion (not administered)  aspirin chewable tablet 324 mg (0 mg Oral Hold 03/05/2016 0917)    Or  aspirin suppository 300 mg ( Rectal See Alternative 02/18/2016 0917)  0.9 %  sodium chloride infusion (not administered)  acetaminophen (TYLENOL) tablet 650 mg (not administered)  norepinephrine (LEVOPHED) 4 mg in dextrose 5 % 250 mL (0.016 mg/mL) infusion (5 mcg/min Intravenous Transfusing/Transfer 02/27/2016 0926)  insulin aspart (novoLOG) injection 2-6 Units (not administered)  heparin injection 1,000-6,000 Units (not administered)  heparin 10,000 units/ 20 mL infusion syringe (not administered)  heparin bolus via infusion syringe 1,000 Units (not administered)  heparinized saline (2000 units/L) primer fluid for CRRT (not administered)  prismasol BGK 0/2.5 5,000 mL dialysis replacement fluid (not administered)  prismasol BGK 0/2.5 5,000 mL dialysis replacement fluid (not administered)  prismasol BGK 0/2.5 5,000 mL dialysis solution (not administered)  calcium gluconate inj 10% (1 g) URGENT USE ONLY! (1 g Intravenous Given 02/28/2016 0816)  albuterol (PROVENTIL) (2.5 MG/3ML) 0.083% nebulizer solution 10 mg (10 mg Nebulization Given by Other 02/22/2016 0817)  insulin aspart (novoLOG) injection 10 Units (10 Units Intravenous Given 02/25/2016 0820)  dextrose 50 % solution 50 mL (50 mLs Intravenous Given 02/25/2016 0821)  sodium bicarbonate injection 50 mEq (50 mEq Intravenous Given 03/15/2016 0812)  sodium bicarbonate injection 50 mEq (50 mEq Intravenous Given 02/23/16 0818)  sodium bicarbonate injection 50 mEq (50 mEq Intravenous Given 02/23/16 0819)     Initial Impression / Assessment and Plan / ED Course  I have reviewed the triage  vital signs and the nursing notes.  Pertinent labs & imaging results that were available during my care of the patient were reviewed by me and considered in my medical decision making (see chart for details).  Clinical Course   Immediately after arrival to transfer the patient from the EMS gurney onto our equipment, and subsequently had continued transcutaneous pacing, as well as ventilatory support. After we obtained a 12-lead EKG I changed the patient's [place] airway to a endotracheal tube. This was performed without complication.  INTUBATION Performed by: Carmin Muskrat  Required items: required blood products, implants, devices, and special equipment available Patient identity confirmed: provided demographic data and hospital-assigned identification number Time out: Immediately prior to procedure a "time out" was called to verify the correct patient, procedure, equipment, support staff and site/side marked as required.  Indications: airway protection, cardiac arrest  Intubation method: Glidescope Laryngoscopy   Preoxygenation: BVM   Tube Size: 7.5cuffed  Post-procedure assessment: chest rise and ETCO2 monitor Breath sounds: equal and absent over the epigastrium Tube secured with: ETT holder Chest x-ray interpreted by radiologist and me.  Chest x-ray findings: endotracheal tube in appropriate position  Patient tolerated the procedure well with no immediate complications.   Subsequently, the patient's breath sounds were auscultated, and  with coarse breath sounds bilaterally was switched from fluids for the patient's hypotension to norepinephrine. Blood pressure subsequently began to improve, please see nursing notes for data.  Initial labs notable for hyperkalemia >8, consistent with patient's history of missed dialysis, nausea and vomiting.  Patient received calcium gluconate, bicarbonate, albuterol, insulin. I discussed patient's case with our critical care  colleagues. After receiving initial resuscitation for his hyperkalemia the patient continued to have wide-complex bradycardia as a native rhythm.  I discussed patient's case for critical care colleagues, at bedside.  Update: On repeat exam the patient's blood pressure is substantially improved, he remains sedated. Additional labs notable for anemia  9:33 AM I discussed the patient's case with his family members. They note that he has had several prior similar episodes, also when he did not have dialysis.  On repeat exam the patient continues to have paced rhythm, blood pressure now with map greater than 70. Patient remains unresponsive Final Clinical Impressions(s) / ED Diagnoses   Final diagnoses:  Cardiac arrest ([place])   CRITICAL CARE Performed by: Carmin Muskrat Total critical care time: 50 minutes Critical care time was exclusive of separately billable procedures and treating other patients. Critical care was necessary to treat or prevent imminent or life-threatening deterioration. Critical care was time spent personally by me on the following activities: development of treatment plan with patient and/or surrogate as well as nursing, discussions with consultants, evaluation of patient's response to treatment, examination of patient, obtaining history from patient or surrogate, ordering and performing treatments and interventions, ordering and review of laboratory studies, ordering and review of radiographic studies, pulse oximetry and re-evaluation of patient's condition.     Carmin Muskrat, MD 03/12/2016 [phone]

## 2016-02-23 NOTE — [place] Notes (Signed)
[place], left message with medical records to call about POA. Called relative, 1st cousin, Barrie Folk to discuss POA. She informed me that his daughter is very hard to get in touch with. I told her that we would contact her further if we needed information.

## 2016-02-23 NOTE — Consult Note (Signed)
NEURO HOSPITALIST CONSULT NOTE   Requestig physician: Dr. 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 male He resides at [facility]. Hx received by EDP. Apparently refused scheduled HD on 11/4. Found unresponsive by SNF staff. EMS called and he was found pulseless and asystolic on arrival. He underwent 20 minutes of ACLS before ROSC obtained. He arrived on external pacer, remained hypotensive so started on Levophed gtt. K was 8.2. On PCCM arrival he was unresponsive on vent w/ agonal resp efforts over the vent. He had been treated for Hyperkalemia and nephrology notified. Currently he is on propofol. Patient is showing intermittent myoclonus which both stimulus induced and non-stimulus induced. With noxious stimuli he is showing extensor posturing. She'll EEG was attempted today however this was largely uninterpretable due to artifact from external pacer activity.  Past Medical History:  Diagnosis Date  . Anemia, chronic disease    a. Capsule endoscopy reportedly negative 08/2013. b. seen by heme with concern for minor B cell population on BMB, being observed.  . Arthritis    "right shoulder" (11/09/2012)  . Chronic diastolic CHF (congestive heart failure) ([place])    a. EF 60 - 65% per [place] echo 11/2011. b. Echo 02/2012: severe LVH, focal basal hypertrophy, EF 60-65%, mild Mr.  . Cocaine abuse    mentioned in notes from [place]  . Diabetes mellitus without complication ([place])   . ESRD (end stage renal disease) on dialysis [facility])    "TTS; [address]" (06/19/2015)  . ESRD on hemodialysis ([place]) since 2012   a. Since 2012. ESRD was due to "drugs", primarily used cocaine.  Has 3-5 year hx of HTN, no DM.  Gets HD on TTS schedule at [place]. Originally  from [place].   . GI bleed 05/04/2012  . Head injury, closed, with concussion   . Headache(784.0)   . Helicobacter pylori gastritis    not defined if this was treated  . Hematochezia   . Hepatitis B core antibody positive    03/01/10  . Hepatitis C antibody test positive    was HIV negative, 02/28/12  . History of blood transfusion   . Hypercalcemia   . Hyperpotassemia   . Hypertension   . Hypertensive heart disease   . Optic neuritis, left   . Polyp of colon, adenomatous    May 2012.  Dr Trenton Founds in [place]  . Positive QuantiFERON-TB Gold test    11/2011  . Protein-calorie malnutrition, severe ([place])   .  Seizure disorder [facility])    questionable history of - will need to clarify with PCP  . Tobacco abuse     Past Surgical History:  Procedure Laterality Date  . [place] TRANSPOSITION  03/07/2012   Procedure: BASCILIC VEIN TRANSPOSITION;  Surgeon: Conrad Nicholson, MD;  Location: [place];  Service: Vascular;  Laterality: Left;  First Stage  . BASCILIC VEIN TRANSPOSITION Left 05/31/2012   Procedure: BASCILIC VEIN TRANSPOSITION;  Surgeon: Conrad [place], MD;  Location: [place];  Service: Vascular;  Laterality: Left;  Left 2nd Stage Basilic Vein Transposition with gortex graft revision using 11mx10cm graft  . [name] CAPSULE STUDY N/A 08/27/2013   Procedure: [name] CAPSULE STUDY;  Surgeon: DMilus Banister MD;  Location[place] [place]  Service: Endoscopy;  Laterality: N/A;  . INSERTION OF DIALYSIS CATHETER     right chest  . JOINT REPLACEMENT    . SHOULDER OPEN ROTATOR CUFF REPAIR Right   . TOTAL KNEE ARTHROPLASTY Left     Family History  Problem Relation Age of Onset  . Diabetes Mother   . Hypertension Mother   . Stroke Mother   . Kidney failure Mother   . Cancer Father      Social History:  reports that he has been smoking Cigarettes.  He has a 67.50 pack-year smoking history. He has never used smokeless tobacco. He reports that he drinks about 14.4 oz of alcohol per week .  He reports that he uses drugs, including "Crack" cocaine, Cocaine, and Marijuana.  Allergies  Allergen Reactions  . Reglan [Metoclopramide] Other (See Comments)    Tardive dyskinesia in 11/2011 in [place]                                                                                                                     Prior to Admission:  Prescriptions Prior to Admission  Medication Sig Dispense Refill Last Dose  . acetaminophen (TYLENOL) 325 MG tablet Take 650 mg by mouth every 4 (four) hours as needed.   Past Week at Unknown time  . aspirin EC 81 MG EC tablet Take 1 tablet (81 mg total) by mouth daily. 30 tablet 3 Past Week at Unknown time  . atorvastatin (LIPITOR) 40 MG tablet Take 1 tablet (40 mg total) by mouth at bedtime. 30 tablet 3 Past Week at Unknown time  . B Complex-C-Folic Acid (RENA-VITE PO) Take 1 tablet by mouth daily.   Past Week at Unknown time  . calcium acetate (PHOSLO) 667 MG capsule Take 667 mg by mouth 3 (three) times daily with meals.    Past Week at Unknown time  . docusate sodium (COLACE) 100 MG capsule Take 100 mg by mouth 2 (two) times daily.   Past Week at Unknown time  . glucagon (GLUCAGON EMERGENCY) 1 MG injection Inject 1 mg into the vein once as needed.   12/01/2015 at 1643  . hydrALAZINE (APRESOLINE) 100 MG tablet Take 1 tablet (100 mg total) by mouth every  6 (six) hours. 90 tablet 0   . labetalol (NORMODYNE) 100 MG tablet Take 1 tablet (100 mg total) by mouth 3 (three) times daily. 90 tablet 0 Past Week at Unknown time  . lacosamide 100 MG TABS Take 1 tablet (100 mg total) by mouth 2 (two) times daily. 60 tablet 2   . losartan (COZAAR) 100 MG tablet Take 100 mg by mouth at bedtime.    Past Week at Unknown time  . mirtazapine (REMERON) 7.5 MG tablet Take 7.5 mg by mouth at bedtime.   Past Week at Unknown time  . ondansetron (ZOFRAN) 4 MG tablet Take 4 mg by mouth every 8 (eight) hours as needed for nausea or vomiting.   Past Week at Unknown  time  . pantoprazole (PROTONIX) 40 MG tablet Take 1 tablet (40 mg total) by mouth daily. 30 tablet 1   . polyethylene glycol (MIRALAX / GLYCOLAX) packet Take 17 g by mouth daily. 14 each 0 Past Week at Unknown time  . saccharomyces boulardii (FLORASTOR) 250 MG capsule Take 250 mg by mouth daily.   Past Week at Unknown time  . senna-docusate (SENOKOT-S) 8.6-50 MG tablet Take 1 tablet by mouth 2 (two) times daily. For constipation 60 tablet 3 Past Week at Unknown time  . sevelamer carbonate (RENVELA) 2.4 g PACK Take 2.4 g by mouth 3 (three) times daily with meals. 90 each 1   . verapamil (CALAN) 120 MG tablet Take 1 tablet (120 mg total) by mouth every 8 (eight) hours. (Patient not taking: Reported on 12/24/2015) 60 tablet 3 Not Taking at Unknown time   Scheduled: . aspirin  324 mg Oral NOW   Or  . aspirin  300 mg Rectal NOW  . calcium gluconate  1 g Intravenous Once  . chlorhexidine gluconate (MEDLINE KIT)  15 mL Mouth Rinse BID  . [START ON 03/04/2016] Chlorhexidine Gluconate Cloth  6 each Topical Q0600  . insulin aspart  2-6 Units Subcutaneous Q4H  . levETIRAcetam  500 mg Intravenous Q12H  . LORazepam  4 mg Intravenous Once  . mouth rinse  15 mL Mouth Rinse 10 times per day  . mupirocin ointment  1 application Nasal BID  . sucralfate  1 g Oral Q6H     ROS:                                                                                                                                       History obtained from the patient and unobtainable from patient due to mental status  General ROS: negative for - chills, fatigue, fever, night sweats, weight gain or weight loss Psychological ROS: negative for - behavioral disorder, hallucinations, memory difficulties, mood swings or suicidal ideation Ophthalmic ROS: negative for - blurry vision, double vision, eye pain or loss of vision ENT ROS: negative for - epistaxis, nasal discharge, oral lesions, sore throat, tinnitus or vertigo Allergy and  Immunology ROS: negative for - hives or itchy/watery eyes Hematological and Lymphatic ROS: negative for - bleeding problems, bruising or swollen lymph nodes Endocrine ROS: negative for - galactorrhea, hair pattern changes, polydipsia/polyuria or temperature intolerance Respiratory ROS: negative for - cough, hemoptysis, shortness of breath or wheezing Cardiovascular ROS: negative for - chest pain, dyspnea on exertion, edema or irregular heartbeat Gastrointestinal ROS: negative for - abdominal pain, diarrhea, hematemesis, nausea/vomiting or stool incontinence Genito-Urinary ROS: negative for - dysuria, hematuria, incontinence or urinary frequency/urgency Musculoskeletal ROS: negative for - joint swelling or muscular weakness Neurological ROS: as noted in HPI Dermatological ROS: negative for rash and skin lesion changes   Blood pressure (!) 151/108, pulse 78, temperature (!) 86 F (30 C), temperature source Core (Comment), resp. rate 16, height _0  (1.803 m), weight 66.5 kg (146 lb 9.7 oz), SpO2 100 %.   Neurologic Examination:                                                                                                        Neurological Examination Mental Status: Patient does not respond to verbal stimuli.  Does not respond to deep sternal rub.  Does not follow commands.  No verbalizations noted.  Cranial Nerves: II: patient does not respond confrontation bilaterally, pupils right 2 mm, left 2 mm,and reactive bilaterally III,IV,VI: doll's response resident bilaterally. V,VII: corneal reflex present bilaterally  VIII: patient does not respond to verbal stimuli IX,X: gag reflex absent, XI: trapezius strength unable to test bilaterally XII: tongue strength unable to test Motor: Extremities flaccid throughout. With noxious stimuli bilateral upper extremities show extensor posturing. No flexor or extensor posturing with noxious stimuli of lower extremities Sensory: As noted above   Deep Tendon Reflexes:  2+ in the upper extremities. Plantars: upgoing bilaterally Cerebellar: Unable to perform     Lab Results: Basic Metabolic Panel:  Recent Labs Lab 03/15/2016 0753 03/16/2016 0803 03/02/2016 0852  NA 133* 130* 134*  K >7.5* 8.2* >7.5*  CL 95* 103 95*  CO2 19*  --  21*  GLUCOSE 252* 237* 361*  BUN 91* 95* 86*  CREATININE 8.98* 8.80* 8.53*  CALCIUM 8.9  --  9.1  MG 2.9*  --  2.8*  PHOS  --   --  10.8*    Liver Function Tests:  Recent Labs Lab 03/05/2016 0753 03/14/2016 0852  AST 218* 231*  ALT 111* 115*  ALKPHOS 85 94  BILITOT 0.6 0.6  PROT 5.9* 5.6*  ALBUMIN 2.6* 2.7*   No results for input(s): LIPASE, AMYLASE in the last 168 hours. No results for input(s): AMMONIA in the last 168 hours.  CBC:  Recent Labs Lab 03/01/2016 0753 03/03/2016 0803 03/11/2016 0852  WBC 12.4*  --  9.3  NEUTROABS 8.6*  --   --   HGB 7.5* 7.8* 6.8*  HCT 24.0* 23.0* 21.4*  MCV 90.2  --  87.7  PLT 255  --  184    Cardiac Enzymes:  Recent Labs Lab 02/20/2016 0753  TROPONINI 0.03*    Lipid Panel:  Recent Labs Lab 02/19/2016 0753  03/10/2016 1253  CHOL 99  --   TRIG 61 58  HDL 52  --   CHOLHDL 1.9  --   VLDL 12  --   LDLCALC 35  --     CBG:  Recent Labs Lab 03/18/2016 1157  GLUCAP 131*    Microbiology: Results for orders placed or performed during the hospital encounter of 02/19/2016  MRSA PCR Screening     Status: Abnormal   Collection Time: 02/22/2016  9:57 AM  Result Value Ref Range Status   MRSA by PCR POSITIVE (A) NEGATIVE Final    Comment:        The GeneXpert MRSA Assay (FDA approved for NASAL specimens only), is one component of a comprehensive MRSA colonization surveillance program. It is not intended to diagnose MRSA infection nor to guide or monitor treatment for MRSA infections. RESULT CALLED TO, READ BACK BY AND VERIFIED WITH: Rudene Anda RN 12:35 03/07/2016 ([email or web address])   Culture, respiratory (NON-Expectorated)     Status: None  (Preliminary result)   Collection Time: 03/15/2016 11:46 AM  Result Value Ref Range Status   Specimen Description TRACHEAL ASPIRATE  Final   Special Requests Normal  Final   Gram Stain   Final    MODERATE WBC PRESENT, PREDOMINANTLY PMN FEW SQUAMOUS EPITHELIAL CELLS PRESENT ABUNDANT GRAM POSITIVE COCCI IN PAIRS AND CHAINS FEW GRAM POSITIVE COCCI IN CLUSTERS MODERATE GRAM POSITIVE RODS FEW GRAM NEGATIVE RODS    Culture PENDING  Incomplete   Report Status PENDING  Incomplete    Coagulation Studies:  Recent Labs  03/07/2016 0753 03/05/2016 0852  LABPROT 14.5 14.4  INR 1.12 1.11    Imaging: Dg Chest Portable 1 View  Result Date: 03/12/2016 CLINICAL DATA:  Hypoxia EXAM: PORTABLE CHEST 1 VIEW COMPARISON:  February 23, 2016 FINDINGS: Endotracheal tube tip is 5.5 cm above the carina. No pneumothorax. There is cardiomegaly with pulmonary venous hypertension. There is no frank edema or consolidation. Metallic pellets are noted overlying the right glenoid region. IMPRESSION: Endotracheal tube as described without pneumothorax. Pulmonary vascular congestion without frank edema or consolidation. Electronically Signed   By: Lowella Grip III M.D.   On: 02/18/2016 08:54   Dg Chest Port 1 View  Result Date: 02/18/2016 CLINICAL DATA:  Hypoxia.  Status post cardiac arrest EXAM: PORTABLE CHEST 1 VIEW COMPARISON:  December 28, 2015 FINDINGS: Endotracheal tube tip is 5.2 cm above the carina. No pneumothorax evident. There is mild interstitial edema, primarily in the upper lobes. There is no airspace consolidation or volume loss. The heart is enlarged with pulmonary venous hypertension. No fracture evident. IMPRESSION: Endotracheal tube as described without pneumothorax. Evidence of a degree of congestive heart failure. No airspace consolidation. Electronically Signed   By: Lowella Grip III M.D.   On: 03/15/2016 08:38       Assessment and plan per attending neurologist  Etta Quill PA-C [facility] [phone]  02/29/2016, 4:08 PM   Assessment/Plan:  This is a unfortunate 60 year old male with unknown down time and 20 minutes of ACLS prior to return of spontaneous circulation. While in ICU he is been showing myoclonic activity along with extensor posturing to pain. Neurology was asked to evaluate patient. Initial EEG was obtained however this was not viable secondary to external pacer artifact. At this time will repeat EEG and make further clinical decision on prognostication and treatment after this.  Further dictation will be added to this note by Dr. Leonel Ramsay

## 2016-02-23 NOTE — ED Notes (Signed)
Assisted [place], RN with rectal temperature on patient; also changed patient's diaper and placed a clean dry diaper on patient

## 2016-02-23 NOTE — Progress Notes (Signed)
Repeat eeg complete. Results pending

## 2016-02-23 NOTE — Progress Notes (Addendum)
Pt own HR at 72 beats. No longer pacing pt.

## 2016-02-23 NOTE — Procedures (Signed)
Central Venous dialysis Catheter Insertion Procedure Note Geovonnie Koonz [id number] [birth year]  Procedure: Insertion of Central Venous Catheter Indications: dialysis   Procedure Details Consent: Risks of procedure as well as the alternatives and risks of each were explained to the (patient/caregiver).  Consent for procedure obtained. and Unable to obtain consent because of emergent medical necessity. Time Out: Verified patient identification, verified procedure, site/side was marked, verified correct patient position, special equipment/implants available, medications/allergies/relevent history reviewed, required imaging and test results available.  Performed  Maximum sterile technique was used including antiseptics, cap, gloves, gown, hand hygiene, mask and sheet. Skin prep: Chlorhexidine; local anesthetic administered A antimicrobial bonded/coated triple lumen catheter was placed in the left femoral vein due to patient being a dialysis patient using the Seldinger technique.  Evaluation Blood flow good Complications: No apparent complications Patient did tolerate procedure well. Chest X-ray ordered to verify placement.  CXR: n/a.  Clementeen Graham 02/29/2016, 10:28 AM Erick Colace ACNP-BC [place] Pager # [phone] OR # [phone] if no answer  Rush Farmer, M.D. [facility] Pulmonary/Critical Care Medicine. Pager: [phone]. After hours pager: [phone].

## 2016-02-24 ENCOUNTER — Inpatient Hospital Stay (HOSPITAL_COMMUNITY): Payer: Medicare Other

## 2016-02-24 DIAGNOSIS — R570 Cardiogenic shock: Secondary | ICD-10-CM

## 2016-02-24 DIAGNOSIS — G931 Anoxic brain damage, not elsewhere classified: Secondary | ICD-10-CM

## 2016-02-24 DIAGNOSIS — J9601 Acute respiratory failure with hypoxia: Secondary | ICD-10-CM

## 2016-02-24 LAB — RENAL FUNCTION PANEL
ANION GAP: 13 (ref 5–15)
Albumin: 3.1 g/dL — ABNORMAL LOW (ref 3.5–5.0)
BUN: 36 mg/dL — ABNORMAL HIGH (ref 6–20)
CO2: 25 mmol/L (ref 22–32)
Calcium: 8.4 mg/dL — ABNORMAL LOW (ref 8.9–10.3)
Chloride: 99 mmol/L — ABNORMAL LOW (ref 101–111)
Creatinine, Ser: 3.64 mg/dL — ABNORMAL HIGH (ref 0.61–1.24)
GFR calc Af Amer: 20 mL/min — ABNORMAL LOW (ref >60)
GFR calc non Af Amer: 17 mL/min — ABNORMAL LOW (ref >60)
GLUCOSE: 79 mg/dL (ref 65–99)
POTASSIUM: 3.7 mmol/L (ref 3.5–5.1)
Phosphorus: 4.2 mg/dL (ref 2.5–4.6)
Sodium: 137 mmol/L (ref 135–145)

## 2016-02-24 LAB — CBC
HCT: 21 % — ABNORMAL LOW (ref 39.0–52.0)
Hemoglobin: 6.8 g/dL — CL (ref 13.0–17.0)
MCH: 27.6 pg (ref 26.0–34.0)
MCHC: 32.4 g/dL (ref 30.0–36.0)
MCV: 85.4 fL (ref 78.0–100.0)
PLATELETS: 182 10*3/uL (ref 150–400)
RBC: 2.46 MIL/uL — ABNORMAL LOW (ref 4.22–5.81)
RDW: 17.6 % — AB (ref 11.5–15.5)
WBC: 8.5 10*3/uL (ref 4.0–10.5)

## 2016-02-24 LAB — GLUCOSE, CAPILLARY
GLUCOSE-CAPILLARY: 43 mg/dL — AB (ref 65–99)
GLUCOSE-CAPILLARY: 76 mg/dL (ref 65–99)
GLUCOSE-CAPILLARY: 79 mg/dL (ref 65–99)
Glucose-Capillary: 81 mg/dL (ref 65–99)

## 2016-02-24 LAB — BLOOD GAS, ARTERIAL
Acid-Base Excess: 2.5 mmol/L — ABNORMAL HIGH (ref 0.0–2.0)
Bicarbonate: 25.5 mmol/L (ref 20.0–28.0)
Drawn by: 345601
FIO2: 80
MECHVT: 600 mL
O2 Saturation: 99.7 %
PATIENT TEMPERATURE: 98.6
PCO2 ART: 33 mmHg (ref 32.0–48.0)
PEEP: 10 cmH2O
PO2 ART: 355 mmHg — AB (ref 83.0–108.0)
RATE: 16 resp/min
pH, Arterial: 7.5 — ABNORMAL HIGH (ref 7.350–7.450)

## 2016-02-24 LAB — PHOSPHORUS: Phosphorus: 4.1 mg/dL (ref 2.5–4.6)

## 2016-02-24 LAB — TYPE AND SCREEN
ABO/RH(D): A POS
Antibody Screen: NEGATIVE

## 2016-02-24 LAB — MAGNESIUM: Magnesium: 2.3 mg/dL (ref 1.7–2.4)

## 2016-02-24 LAB — ALBUMIN: Albumin: 3 g/dL — ABNORMAL LOW (ref 3.5–5.0)

## 2016-02-24 MED ORDER — SODIUM CHLORIDE 0.9 % IV SOLN
10.0000 mg/h | INTRAVENOUS | Status: DC
  Administered 2016-02-24: 10 mg/h via INTRAVENOUS
  Filled 2016-02-24: qty 10

## 2016-02-24 MED ORDER — PROPOFOL 1000 MG/100ML IV EMUL
5.0000 ug/kg/min | INTRAVENOUS | Status: DC
  Administered 2016-02-24: 60 ug/kg/min via INTRAVENOUS
  Filled 2016-02-24: qty 100

## 2016-02-24 MED ORDER — VALPROATE SODIUM 500 MG/5ML IV SOLN
500.0000 mg | Freq: Three times a day (TID) | INTRAVENOUS | Status: DC
  Administered 2016-02-24: 500 mg via INTRAVENOUS
  Filled 2016-02-24 (×3): qty 5

## 2016-02-24 MED ORDER — HYDRALAZINE HCL 50 MG PO TABS: 100.0000 mg | ORAL_TABLET | Freq: Three times a day (TID) | ORAL | Status: DC

## 2016-02-24 MED ORDER — VALPROATE SODIUM 500 MG/5ML IV SOLN
1.5000 g | Freq: Once | INTRAVENOUS | Status: AC
  Administered 2016-02-24: 1500 mg via INTRAVENOUS
  Filled 2016-02-24: qty 15

## 2016-02-25 ENCOUNTER — Encounter: Payer: Medicare Other | Admitting: Vascular Surgery

## 2016-02-26 LAB — CULTURE, RESPIRATORY: SPECIAL REQUESTS: NORMAL

## 2016-02-26 LAB — CULTURE, RESPIRATORY W GRAM STAIN: Culture: NORMAL

## 2016-02-28 LAB — CULTURE, BLOOD (ROUTINE X 2)
CULTURE: NO GROWTH
CULTURE: NO GROWTH

## 2016-03-04 ENCOUNTER — Telehealth: Payer: Self-pay | Admitting: Pulmonary Disease

## 2016-03-04 NOTE — Telephone Encounter (Signed)
[facility] calling for signed death certificate stat so they can proceed with cremation.

## 2016-03-04 NOTE — Telephone Encounter (Signed)
Called funeral home back and informed them per DS that this needs to go to Dr. Nelda Marseille. Nothing further needed.

## 2016-03-05 ENCOUNTER — Telehealth: Payer: Self-pay

## 2016-03-05 NOTE — Telephone Encounter (Signed)
On 03/26/2016 I received a death certificate from [facility] (faxed). The death certificate is for cremation. The death certificate will be taken to Pulmonary Unit @ [facility] for possibly having Doctor Ashok Cordia sign the d/c since Doctor Nelda Marseille is on vacation until November 28th.  On 26-Mar-2016 I received the death certificate back from [place]. I got the death certificate ready and faxed the death certificate over to the funeral home per the funeral home request.

## 2016-03-10 ENCOUNTER — Telehealth: Payer: Self-pay

## 2016-03-10 NOTE — Telephone Encounter (Signed)
On 02-Apr-2016 I received a death certificate from [employer] (original). The death certificate is for cremation. The patient is a patient of [facility].  The death certificate will be taken to [facility] for signature.  On April 02, 2016 I received the death certificate back from [place]. I got the death certificate ready and called the funeral home to let them know the death certificate was mailed to the [facility] Dept per the funeral home request.

## 2016-03-19 NOTE — Progress Notes (Signed)
Pt passed at [place] surrounded by his family and friends.

## 2016-03-19 NOTE — Progress Notes (Addendum)
[facility] Progress Note Patient Name: [name] DOB: 10-04-1955 MRN: [id number]   Date of Service  March 09, 2016  HPI/Events of Note  Hgb still 6.8. Type & screen never sent.  Hypertensive.   eICU Interventions  1. Repeat Hgb/Hct at noon 2. Checking Ferritin, LDH & Haptoglobin at noon as well  3. Type & screen at noon      Intervention Category Intermediate Interventions: Bleeding - evaluation and treatment with blood products  Tera Partridge 03-09-16, 5:42 AM

## 2016-03-19 NOTE — Progress Notes (Signed)
Post mordem care performed at this time per RN. CDS called and notified. Not a candidate for donor.

## 2016-03-19 NOTE — Progress Notes (Signed)
   25-Mar-2016 1800  Clinical Encounter Type  Visited With Patient and family together  Visit Type Follow-up  Referral From Chaplain  Consult/Referral To Chaplain  Spiritual Encounters  Spiritual Needs Emotional  Stress Factors  Patient Stress Factors Major life changes  Family Stress Factors Family relationships  Checked in with family to make introduction and offer any needs. Family coping and will inform medical staff should Kelly be needed.

## 2016-03-19 NOTE — Progress Notes (Signed)
Subjective: No improvement.   Exam: Vitals:   02-29-16 0730 2016-02-29 0802  BP:  (!) 146/80  Pulse: 95 96  Resp: (!) 21 (!) 23  Temp:     Gen: In bed, NAD Resp: non-labored breathing, no acute distress Abd: soft, nt  Neuro: MS: eyes open, but does nto follow commands.  [id number], does not blink to threat, corneals intact.  Motor: extensor posturing to noxious stimulation bilaterally.  Sensory:as above.   Pertinent Labs: Cr 3.64  Impression: 60 yo M with anoxic brain injury. Given the early onset of generalized myoclonus associated with EEG bursts in the setting of an otherwise completely suppressed EEG, I do not feel that he has any significant chance of recovery. Given that the EEG was performed during sedation yesterday, will repeat off of sedation today.   He still has some myoclonus, will add depakote today.   Recommendations: 1) continue keppra 500mg  BID 2) depakote 1.5 g IV x 1, then 500mg  TID.  3) repeat EEG off of sedation today.  4) palliative care consultation.   Roland Rack, MD [facility] [phone]  If 7pm- 7am, please page neurology on call as listed in [place].

## 2016-03-19 NOTE — Progress Notes (Signed)
Interdisciplinary Goals of Care Family Meeting    Date carried out:: 01-Mar-2016  Location of the meeting: Conference room  Member's involved: Nurse Practitioner, Bedside Registered Nurse and Family Member or next of kin  Durable Power of Attorney or acting medical decision maker:  [name] (first cousin). Was also accompanied by his long-time care giver [name]   Discussion: We discussed goals of care for [name] .   We discussed at length the current diagnostic findings including: CT head results, EEG results, neuro consultation, and current physical exam as well as events leading up to arrest and events following. It is our opinion from a critical care stand-point that [name] has suffered a devastating neurological injury. Prior to this event he was functional and had a fairly good QOL per his family. They do however tell me he has had slow decline w/ dialysis days taking more and more out of him especially over the last year. His family and friend acknowledge that life on ventilator support or with trach would not be acceptable for him. I also discussed with them the very limited options of placement for people with trachs and who also require dialysis. Based on the findings above and what the family knows of the patient it was decided to w/d life sustaining interventions.   Code status: Full DNR  Disposition: In-patient comfort care  Time spent for the meeting:  40 minutes.   Erick Colace ACNP-BC [place] Pager # [phone] OR # [phone] if no answer   Clementeen Graham 01-Mar-2016 1:21 PM

## 2016-03-19 NOTE — Progress Notes (Signed)
Chaplain Note: I was requested to meet with family for emotional/spiritual support. There were multiple family present including, only daughter, sister and cousins.  A very supportive family who are accepting of his impending death. They appear to be close and supportive of each other.  They requested that I pray for him and for them, which I did.   Provided some refreshments for a few members at their request. I will pass on to night chaplain and ask him to make contact with them.   Sue Lush

## 2016-03-19 NOTE — Progress Notes (Signed)
Initial Nutrition Assessment  INTERVENTION:   If enteral nutrition support desired recommend: Nepro @ 30 ml/hr 30 ml Prostat five times per day  Provides: 1796 kcal, 133 grams protein, and 523 ml H2O.  NUTRITION DIAGNOSIS:   Inadequate oral intake related to inability to eat as evidenced by NPO status.  GOAL:   Patient will meet greater than or equal to 90% of their needs  MONITOR:   I & O's, Labs, Vent status  REASON FOR ASSESSMENT:   Consult Assessment of nutrition requirement/status (TF recommendations)  ASSESSMENT:   Pt with hx of ESRD on HD, CHF, polysubstance abuse, CHI, hepatitis C and B and severe malnutrition admitted from SNF after refusing HD on 11/4 with cardiac arrest, K+ 8.2.     Pt has been seen multiple times by RD team. Pt does not like Nepro but will drink boost breeze. Weight fluctuates will fluid status. Appears weight was in 130's in 2015. Pt with severe fat and muscle depletion at last admission.  Per MD notes clinical exam is consistent with devastating neurological insult. Plan to discuss care with family. Head CT pending.  Discussed with RN at bedside, no family present. CRRT started on admission  Patient is currently intubated on ventilator support MV: 11.6 L/min Temp (24hrs), Avg:96.5 F (35.8 C), Min:86 F (30 C), Max:99.1 F (37.3 C)  Medications reviewed and include levophed Labs reviewed: on admission: K+ >7.5, PO4 10.8, magnesium 2.8, labs now WNL CBG's: 81  Unable to complete Nutrition-Focused physical exam at this time.  OG tube     Diet Order:  Diet NPO time specified  Skin:  Reviewed, no issues  Last BM:  11/6, rectal tube inserted  Height:   Ht Readings from Last 1 Encounters:  03/05/2016 5\' 11"  (1.803 m)    Weight:   Wt Readings from Last 1 Encounters:  03-22-16 139 lb 15.9 oz (63.5 kg)    Ideal Body Weight:  78.1 kg  BMI:  Body mass index is 19.52 kg/m.  Estimated Nutritional Needs:   Kcal:   [id number]  Protein:  > 127 grams  Fluid:  1.2 L/day  EDUCATION NEEDS:   No education needs identified at this time  [place], [place], [place] Pager [phone] After Hours Pager

## 2016-03-19 NOTE — Progress Notes (Signed)
Pt transported to and from CT without incident ?

## 2016-03-19 NOTE — Clinical Social Work Note (Signed)
CSW consulted for pt being admitted from a facility. CSW received phone call from [facility] regarding obtaining information about next of kin or guardian. CSW called [employer] and spoke to the admissions coordinator. CSW was informed by SNF that pt does not have a guardian and his next of kin is Krist Bucholz [phone]). CSW updated MD and RNCM. CSW will continue to follow.   Darden Dates, MSW, LCSW  Clinical Social Worker  [phone]

## 2016-03-19 NOTE — Discharge Summary (Signed)
NAMEROLLEN, SELDERS NO.:  [id number]  MEDICAL RECORD NO.:  [id number]  LOCATION:  2H13C                        FACILITY:  [facility]  PHYSICIAN:  Providence Lanius, MD  DATE OF BIRTH:  [birth year]  DATE OF ADMISSION:  02/26/2016 DATE OF DISCHARGE:  03-04-2016                              DISCHARGE SUMMARY   DEATH SUMMARY  PRIMARY DIAGNOSIS/CAUSE OF DEATH:  Pulseless activity cardiac arrest.  SECONDARY DIAGNOSES: 1. Anoxic brain injury. 2. Respiratory failure. 3. Cardiogenic shock. 4. Pulmonary edema. 5. Hypertension. 6. Diastolic heart failure. 7. Hyperkalemia. 8. End-stage renal disease, on hemodialysis. 9. Hepatitis C. 10.Anemia of chronic disease. 11.Hyperglycemia. 12.Acute hypoxic ischemic encephalopathy. 13.Burst suppression EEG.  HOSPITAL COURSE:  The patient is a 60 year old male with past medical history of end-stage renal disease, on hemodialysis, with hepatitis, who skipped 1 dialysis session, and was found unresponsive.  The patient was found to be in pulseless activity cardiac arrest.  Code was called and ROSC was achieved.  The patient was brought to the emergency department and subsequently admitted to the Coronary Care Unit.  The patient was persistently comatose with evidence of burst suppression.  Neurology was consulted.  The family finally arrived and Salvadore Dom, Pulmonary Critical Care Nurse practitioner, met with the family, documented the discussion.  The case was discussed at length, all questions were answered.  The family reported that the patient was functional prior to this, but has been declining since started dialysis days and after a long discussion, family decided that the patient would not want this level of care given, as poor prognosis from neurologic standpoint. Morphine was started, the patient was extubated and expired shortly thereafter.    Providence Lanius, MD    WJY/MEDQ  D:  03/02/2016  T:  03/03/2016   Job:  [id number]

## 2016-03-19 NOTE — Progress Notes (Signed)
RT has decreased FIO2 from 80% to 50% and decreased PEEP to 8 due to ABG results. PAO2 on 80% and 10 of PEEP was 355. RT will continue to monitor.

## 2016-03-19 NOTE — Progress Notes (Signed)
Patient ID: [name], male   DOB: 1955/07/07, 60 y.o.   MRN: [id number]  [facility] Progress Note    Subjective:   Events of last 24 hours noted.  Pt is unresponsive and likely with anoxic brain injury.   Objective:   BP (!) 146/80   Pulse 96   Temp 99.1 F (37.3 C) (Oral)   Resp (!) 23   Ht 5' 11"  (1.803 m)   Wt 63.5 kg (139 lb 15.9 oz)   SpO2 100%   BMI 19.52 kg/m   Intake/Output: I/O last 3 completed shifts: In: 2812.6 [I.V.:2572.6; NG/GT:30; IV Piggyback:210] Out: 3081 [Emesis/NG output:150; Other:2931]   Intake/Output this shift:  Total I/O In: 103.4 [I.V.:73.4; NG/GT:30] Out: 180 [Other:180] Weight change:   Physical Exam: Gen:  Intubated and sedated CVS: no rub Resp:scattered rhonchi CZY:[id number] Ext: LUE AVG +T/B, no edema Neuro: unresponsive, eyes open  Labs: BMET  Recent Labs Lab 02/25/2016 0753 02/29/2016 0803 02/25/2016 0852 03/12/2016 1652 Mar 04, 2016 0450 03/04/16 0500  NA 133* 130* 134* 138  --  137  K >7.5* 8.2* >7.5* 5.1  --  3.7  CL 95* 103 95* 100*  --  99*  CO2 19*  --  21* 25  --  25  GLUCOSE 252* 237* 361* 45*  --  79  BUN 91* 95* 86* 73*  --  36*  CREATININE 8.98* 8.80* 8.53* 6.54*  --  3.64*  ALBUMIN 2.6*  --  2.7* 3.1* 3.0* 3.1*  CALCIUM 8.9  --  9.1 8.6*  --  8.4*  PHOS  --   --  10.8* 6.5* 4.1 4.2   CBC  Recent Labs Lab 02/18/2016 0753 03/05/2016 0803 02/26/2016 0852 2016-03-04 0450  WBC 12.4*  --  9.3 8.5  NEUTROABS 8.6*  --   --   --   HGB 7.5* 7.8* 6.8* 6.8*  HCT 24.0* 23.0* 21.4* 21.0*  MCV 90.2  --  87.7 85.4  PLT 255  --  184 182    @IMGRELPRIORS @ Medications:    . aspirin  324 mg Oral NOW   Or  . aspirin  300 mg Rectal NOW  . calcium gluconate  1 g Intravenous Once  . chlorhexidine gluconate (MEDLINE KIT)  15 mL Mouth Rinse BID  . Chlorhexidine Gluconate Cloth  6 each Topical Q0600  . insulin aspart  2-6 Units Subcutaneous Q4H  . levETIRAcetam  500 mg Intravenous Q12H  . mouth rinse  15 mL Mouth  Rinse 10 times per day  . mupirocin ointment  1 application Nasal BID  . sucralfate  1 g Oral Q6H  . valproate sodium  1.5 g Intravenous Once  . valproate sodium  500 mg Intravenous Q8H   Dialysis Orders:[facility] TTS  4h 2K/2.25 Ca 400/auto 1.5 UF Profile2 EDW 56kg LAVF  No Heparin  Hectorol 7 mcg IV q treatment  Mircera 144mg IV q 2 weeks (last dose 150 mcg IV8/26/17)  OP Labs: Hgb 11.2 Tsat 54% K 4.7 Corr Ca 10.3 iPTH 73  Assessment/Plan: 1.  PEA arrest/Cardiogenic shock- most likely due to hyperkalemia from noncompliance with HD (skipped HD on Saturday and signed off an hour early on Thursday).  No longer requiring external pacer.  management per PCCM.  2. AMS- pt with myoclonic jerking, unresponsive, worrisome for anoxic brain injury (brain stem is intact).  Neuro following and for repeat EEG today.  Prognosis is poor.  3. ESRD -  BP improved and off of pressors. Will stop CRRT and transition  to conventional HD if MPOA wants to proceed, however if he has had anoxic brain injury, would then transition to comfort care.  Palliative care consult recommended.   4. Hypertension/volume  - off pressors  5.  Anemia  - significant drop in Hgb and has h/o GI bleed and nonadherence with workup.  Follow and transfuse prn.  Cont with ESA.  No heparin with CRRT due to h/o heme + stools and drop in hgb.  6. VDRF - per PCCM 7.  Metabolic bone disease -  Binders on hold for now 8.  Nutrition - per PCCM 9. Disposition- poor prognosis and await palliative care consult.  Hold off on any further HD.    Donetta Potts, MD [facility] Pager [phone] 03/05/2016, 8:36 AM

## 2016-03-19 NOTE — Progress Notes (Addendum)
175mg  IV morphine wasted by two RNs per protocol.     Wasted with Devoria Glassing, RN  Nikki Dom

## 2016-03-19 NOTE — Progress Notes (Signed)
Pt extubated using withdrawal guidelines.  Pt made comfortable, RN at bedside.

## 2016-03-19 NOTE — Progress Notes (Signed)
PULMONARY / CRITICAL CARE MEDICINE   Name: [name] MRN: [id number] DOB: 04-20-1955    ADMISSION DATE:  03/06/2016 CONSULTATION DATE:  30 [name]  REFERRING MD:  Vanita Panda  CHIEF COMPLAINT: PEA arrest    SUBJECTIVE:  Unresponsive   VITAL SIGNS: BP (!) 162/65   Pulse 99   Temp 99.1 F (37.3 C) (Oral)   Resp 14   Ht 5' 11"  (1.803 m)   Wt 139 lb 15.9 oz (63.5 kg)   SpO2 100%   BMI 19.52 kg/m   HEMODYNAMICS:    VENTILATOR SETTINGS: Vent Mode: PRVC FiO2 (%):  [50 %-100 %] 50 % Set Rate:  [16 bmp] 16 bmp Vt Set:  [600 mL] 600 mL PEEP:  [5 cmH20-10 cmH20] 8 cmH20 Plateau Pressure:  [16 cmH20-25 cmH20] 21 cmH20  INTAKE / OUTPUT: I/O last 3 completed shifts: In: 2812.6 [I.V.:2572.6; NG/GT:30; IV Piggyback:210] Out: 3081 [Emesis/NG output:150; Other:2931]  PHYSICAL EXam general:  Chronically ill appearing aam, currently on external pacer. Agonal resp pattern  Neuro: unresponsive GCS 3 HEENT:  Orally intubated. Edentulous  Cardiovascular:  Distant RRR, PEA as underlying HR, currently paced + LUE good thrill and bruit on graft  Lungs:  Diffuse rhonchi, tactile frem  Abdomen:  Soft not tender hypoactive BS Musculoskeletal:  Prior BKA, otherwise warm and dry  Skin:  Dry and intact  LABS:  BMET  Recent Labs Lab 03/15/2016 0852 03/16/2016 1652 March 10, 2016 0500  NA 134* 138 137  K >7.5* 5.1 3.7  CL 95* 100* 99*  CO2 21* 25 25  BUN 86* 73* 36*  CREATININE 8.53* 6.54* 3.64*  GLUCOSE 361* 45* 79    Electrolytes  Recent Labs Lab 03/01/2016 0753  03/13/2016 0852 03/17/2016 1652 10-Mar-2016 0450 03-10-16 0500  CALCIUM 8.9  --  9.1 8.6*  --  8.4*  MG 2.9*  --  2.8*  --  2.3  --   PHOS  --   < > 10.8* 6.5* 4.1 4.2  < > = values in this interval not displayed.  CBC  Recent Labs Lab 03/11/2016 0753 02/22/2016 0803 03/09/2016 0852 March 10, 2016 0450  WBC 12.4*  --  9.3 8.5  HGB 7.5* 7.8* 6.8* 6.8*  HCT 24.0* 23.0* 21.4* 21.0*  PLT 255  --  184 182    Coag's  Recent  Labs Lab 03/01/2016 0753 03/16/2016 0852  APTT 33 31  INR 1.12 1.11    Sepsis Markers  Recent Labs Lab 02/22/2016 0852  PROCALCITON 0.50    ABG  Recent Labs Lab 03/06/2016 0839 02/20/2016 1718 Mar 10, 2016 0355  PHART 7.388 7.443 7.500*  PCO2ART 36.7 33.1 33.0  PO2ART 125.0* 85.4 355*    Liver Enzymes  Recent Labs Lab 03/08/2016 0753 03/15/2016 0852 03/16/2016 1652 March 10, 2016 0450 03-10-16 0500  AST 218* 231*  --   --   --   ALT 111* 115*  --   --   --   ALKPHOS 85 94  --   --   --   BILITOT 0.6 0.6  --   --   --   ALBUMIN 2.6* 2.7* 3.1* 3.0* 3.1*    Cardiac Enzymes  Recent Labs Lab 02/28/2016 0753  TROPONINI 0.03*    Glucose  Recent Labs Lab 03/07/2016 1624 02/25/2016 1626 02/22/2016 1719 03/03/2016 1955 2016-03-10 0006 10-Mar-2016 0431  GLUCAP 43* 43* 122* 77 79 81    Imaging Dg Chest Port 1 View  Result Date: Mar 10, 2016 CLINICAL DATA:  Evaluate endotracheal tube positioning. EXAM: PORTABLE CHEST 1 VIEW COMPARISON:  02/18/2016 FINDINGS: Endotracheal tube is roughly 5 cm above the carina. Nasogastric tube extends into the abdomen. There appears to be a temperature probe extending to the GE junction region. Cardiac defibrillator pads overlying the chest. Central vascular structures are mildly prominent but there is no frank pulmonary edema. No focal airspace disease. Negative for a pneumothorax. IMPRESSION: Support apparatuses appear to be appropriately positioned. No focal lung disease. Electronically Signed   By: Markus Daft M.D.   On: Feb 29, 2016 09:10  ETT good position No edema    STUDIES:  CT head 11/6>>> EEG 11/6>>> burst suppression   CULTURES: BC 11/6>>>  ANTIBIOTICS:   SIGNIFICANT EVENTS: 11/6 PEA arrest at SNF (missed HD 2 days prior). 20 minutes to ROSC.   LINES/TUBES: OETT 11/6>>>   DISCUSSION: 60yom w esrd. Presents s/p PEA arrest. K 8.2. Time to ROSC 20 minutes but does not account for down time prior. Will admit to ICU, nepho notified. Hyperkalemia  resolved, CXR clear. Major issue at this point is clinical exam c/w devastating neurological insult. For today plan is to obtain CT head, repeat EEG and discuss goals of care w/ family. With information that we have at this point it seems that further aggressive care would be futile. Would favoer transition to palliative focus. Will discuss this further with his family today.   ASSESSMENT / PLAN:  PULMONARY A: Ventilator dependence d/t cardiopulmonary arrest  Pulmonary edema QF gold positive  Iatrogenic respiratory alk  P:   Full vent support Decrease Ve  PAD protocol Assess for weaning daily although suspect neurologic exam will be major barrier  CARDIOVASCULAR A:  PEA arrest Cardiogenic shock -->resolved.  HTN H/o diastolic HF  P:  Tele KVO IVFs Add back hydralazine via tube Admit to ICU   RENAL A:   ESRD -->skipped HD 11/4 Hyperkalemia (8.8)-->resolved.  CRRT stopped this am  P:   Nephro following   GASTROINTESTINAL A:   R/o GIB Hep C P:   IV H2B NPO  HEMATOLOGIC A:   Anemia of chronic disease   ->has had a significant hgb drop over last 4 weeks has dropped 2 gms P:  hemaoccult stools Given futility will not transfuse  SCDs for now  INFECTIOUS A:   No evidence of infection  P:   Trend Fever curve & WBC Pan culture; hold off an abx  ENDOCRINE A:   Hyperglycemia  P:   ssi   NEUROLOGIC A:   Acute Hypoxic ischemic encephalopathy COMA-> burst suppression on EEG Myoclonus  P:   RASS goal: 0  PAD protocol Repeat EEG CT head (non-contrast) this am  FAMILY  - Updates: plan for today   - Inter-disciplinary family meet or Palliative Care meeting due by:  11/15  My ccm time 30 minutes  Erick Colace ACNP-BC [place] Pager # [phone] OR # [phone] if no answer  Attending Note:  60 year old male with ESRD and Hep C who skipped dialysis on Saturday for not feeling well, was found unresponsive in PEA arrest on  11/6. Patient was paced and was intubated and brought to the ED. In the ED, K was 8.2 and patient was externally paced. On exam, patient remains unresponsive off sedation and requiring levophed for BP support.  Given refractory shock and low Hg (drop of 2 gms in one month) decision was made to not institute hypothermia protocol.  Head CT that I reviewed myself, showed indistinctness of gray-white matter suggestive of diffuse anoxia.  Family to arrive  for conversation regarding plan of care.  Will recommend DNR status and comfort care once family is all available.  Patient has no reasonable chance at any meaningful recovery here.  The patient is critically ill with multiple organ systems failure and requires high complexity decision making for assessment and support, frequent evaluation and titration of therapies, application of advanced monitoring technologies and extensive interpretation of multiple databases.   Critical Care Time devoted to patient care services described in this note is 35 Minutes. This time reflects time of care of this signee Dr Jennet Maduro. This critical care time does not reflect procedure time, or teaching time or supervisory time of PA/NP/Med student/Med Resident etc but could involve care discussion time.  Rush Farmer, M.D. [facility] Pulmonary/Critical Care Medicine. Pager: [phone]. After hours pager: [phone].   03-12-2016, 10:08 AM

## 2016-03-19 DEATH — deceased

## 2017-07-27 IMAGING — DX DG CHEST 1V PORT
1 series · 1 of 1 positions shown · non-contrast
Comparison: 12/03/2015

CLINICAL DATA: Weakness and lethargy.  Mostly unresponsive.

EXAM:
PORTABLE CHEST 1 VIEW

[chest ap]
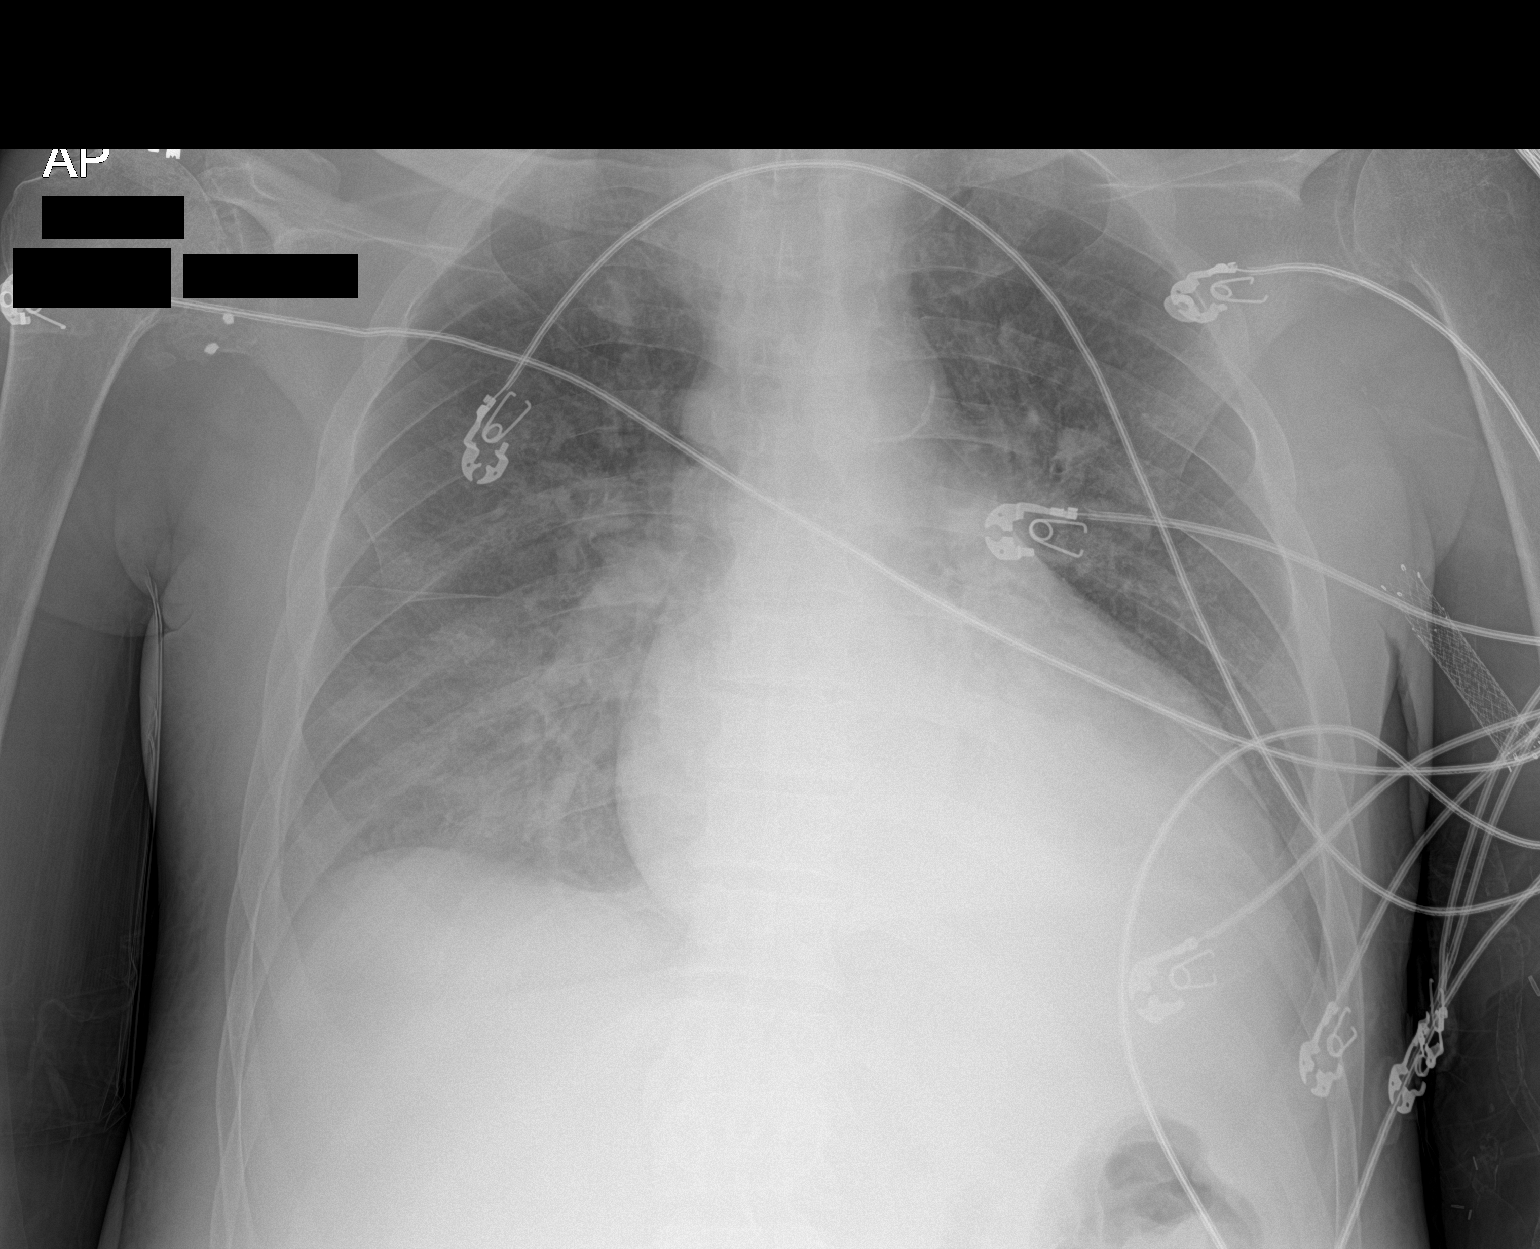

[1 of 1 positions shown; findings below may reference images not displayed]

FINDINGS: The cardio pericardial silhouette is enlarged. There is pulmonary
vascular congestion without overt pulmonary edema. Superimposed
interstitial pulmonary edema likely. The visualized bony structures
of the thorax are intact. Telemetry leads overlie the chest.
IMPRESSION: Cardiomegaly with interstitial pulmonary edema.

## 2017-07-27 IMAGING — DX DG KNEE COMPLETE 4+V*R*
4 series · 4 of 4 positions shown · non-contrast
Comparison: None.

CLINICAL DATA: History of right below-the-knee amputation. Recent
fevers. Question osteomyelitis.

EXAM:
RIGHT KNEE - COMPLETE 4+ VIEW

[knee ap]
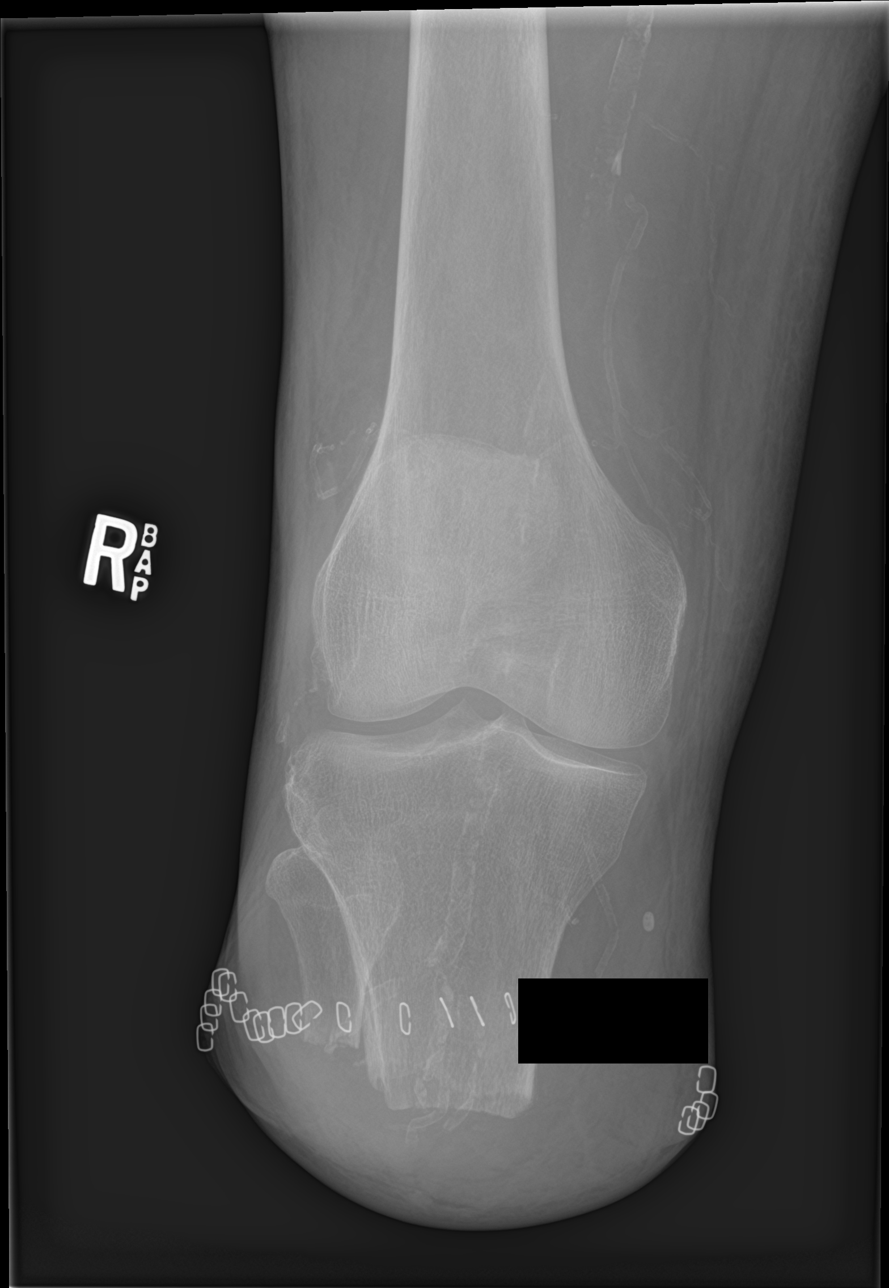

[knee lat]
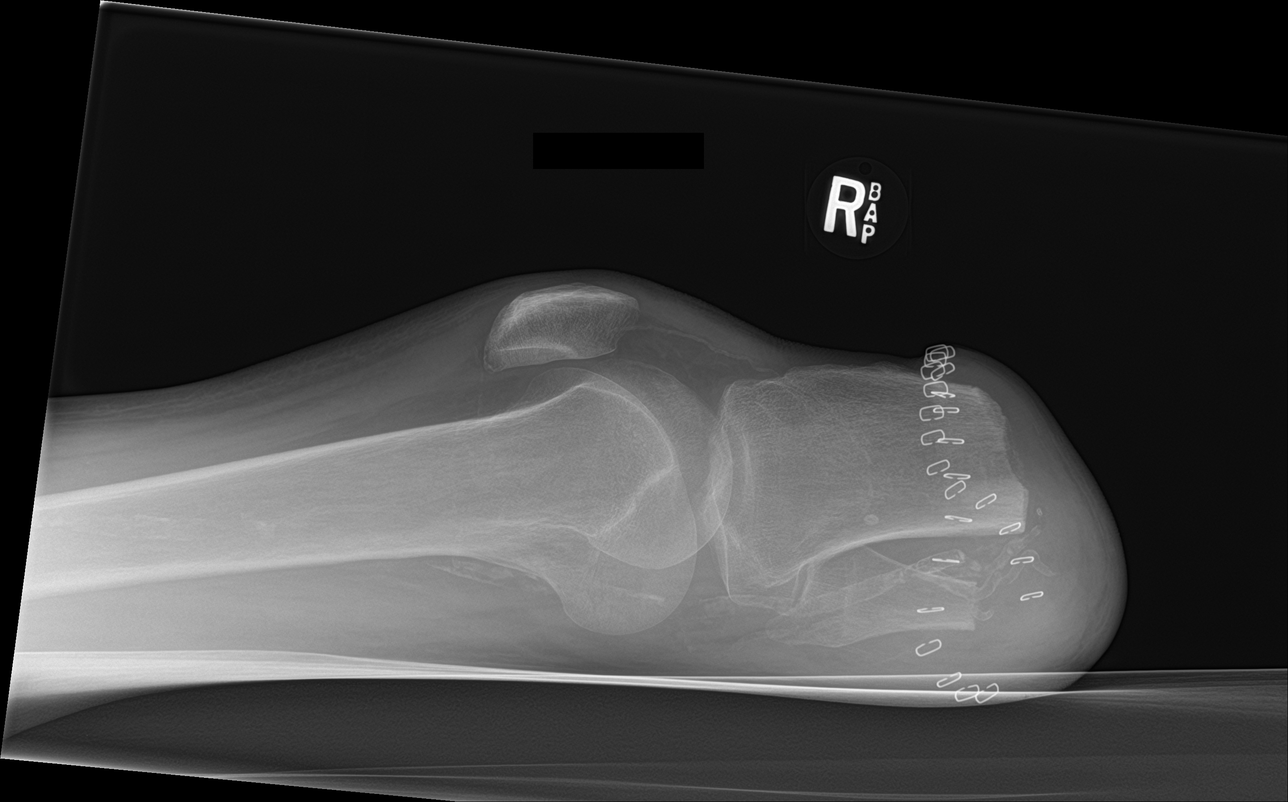

[knee obl (1 of 2)]
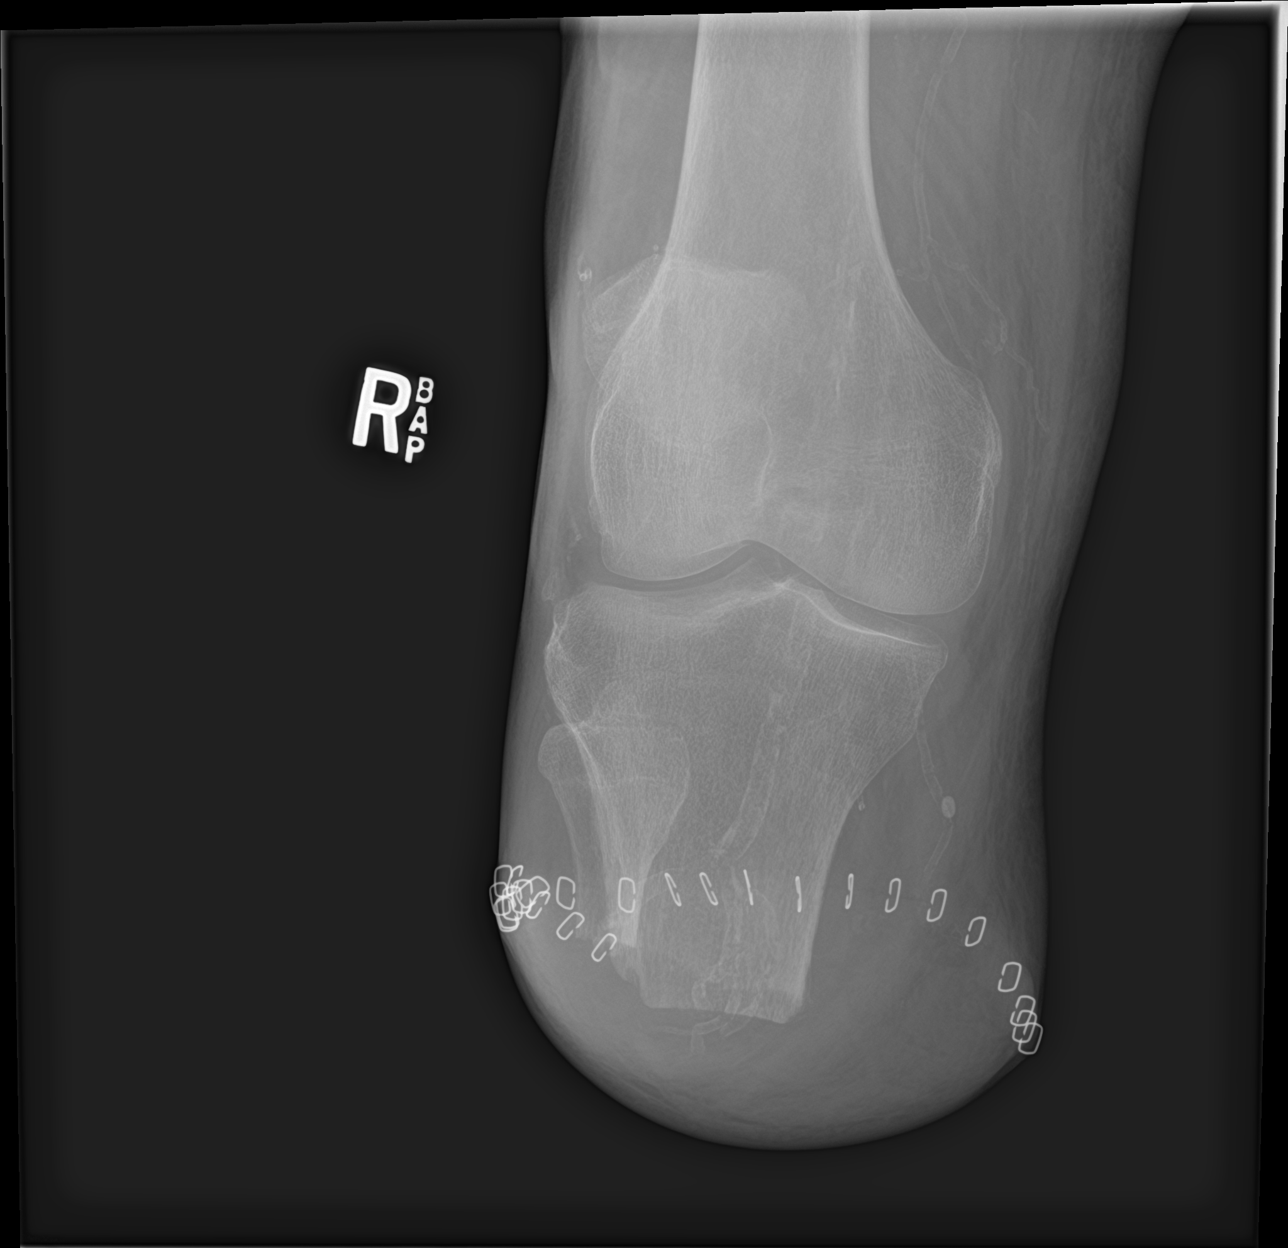

[knee obl (2 of 2)]
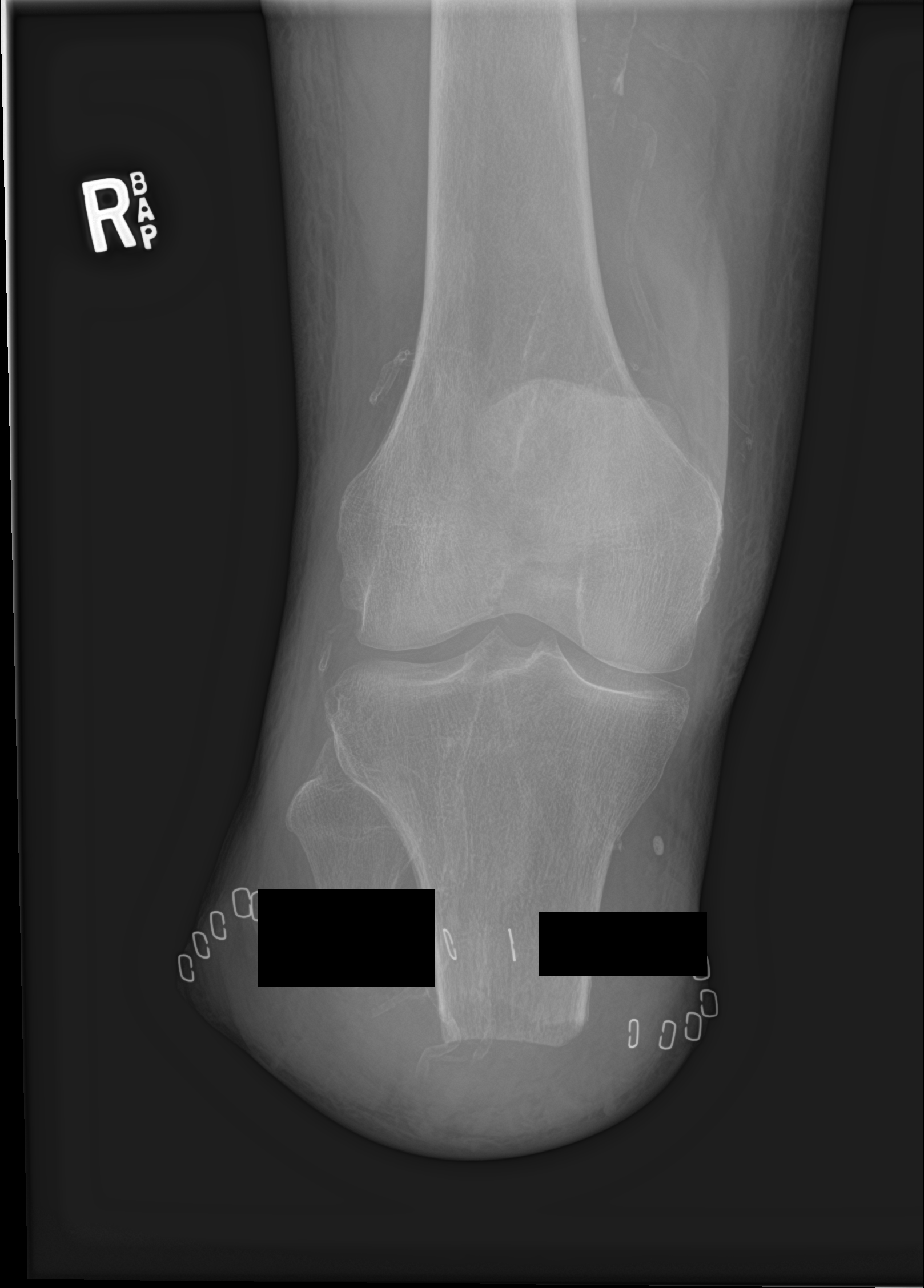

[4 of 4 positions shown; findings below may reference images not displayed]

FINDINGS: Surgical staples are in place at the patient's below-the-knee
amputation. No soft tissue gas collection is identified. No bony
destructive change is seen. There is no fracture. Atherosclerosis is
noted.
IMPRESSION: Status post right BKA.  No acute abnormality.
